# Patient Record
Sex: Male | Born: 1955
Health system: Southern US, Community
[De-identification: ages and names within clinical notes are randomized; demographics above are authoritative.]

## PROBLEM LIST (undated history)

## (undated) DIAGNOSIS — E785 Hyperlipidemia, unspecified: Secondary | ICD-10-CM

## (undated) DIAGNOSIS — R0600 Dyspnea, unspecified: Secondary | ICD-10-CM

## (undated) DIAGNOSIS — Z9889 Other specified postprocedural states: Secondary | ICD-10-CM

## (undated) DIAGNOSIS — F329 Major depressive disorder, single episode, unspecified: Secondary | ICD-10-CM

## (undated) DIAGNOSIS — M1990 Unspecified osteoarthritis, unspecified site: Secondary | ICD-10-CM

## (undated) DIAGNOSIS — K7460 Unspecified cirrhosis of liver: Secondary | ICD-10-CM

## (undated) DIAGNOSIS — I499 Cardiac arrhythmia, unspecified: Secondary | ICD-10-CM

## (undated) DIAGNOSIS — F32A Depression, unspecified: Secondary | ICD-10-CM

## (undated) DIAGNOSIS — R112 Nausea with vomiting, unspecified: Secondary | ICD-10-CM

## (undated) DIAGNOSIS — S060XAA Concussion with loss of consciousness status unknown, initial encounter: Secondary | ICD-10-CM

## (undated) DIAGNOSIS — D696 Thrombocytopenia, unspecified: Secondary | ICD-10-CM

## (undated) DIAGNOSIS — T148XXA Other injury of unspecified body region, initial encounter: Secondary | ICD-10-CM

## (undated) DIAGNOSIS — R079 Chest pain, unspecified: Secondary | ICD-10-CM

## (undated) DIAGNOSIS — F419 Anxiety disorder, unspecified: Secondary | ICD-10-CM

## (undated) DIAGNOSIS — J189 Pneumonia, unspecified organism: Secondary | ICD-10-CM

## (undated) DIAGNOSIS — I10 Essential (primary) hypertension: Secondary | ICD-10-CM

## (undated) DIAGNOSIS — K219 Gastro-esophageal reflux disease without esophagitis: Secondary | ICD-10-CM

## (undated) DIAGNOSIS — J449 Chronic obstructive pulmonary disease, unspecified: Secondary | ICD-10-CM

## (undated) DIAGNOSIS — R161 Splenomegaly, not elsewhere classified: Secondary | ICD-10-CM

## (undated) HISTORY — DX: Other injury of unspecified body region, initial encounter: T14.8XXA

## (undated) HISTORY — DX: Chronic obstructive pulmonary disease, unspecified: J44.9

## (undated) HISTORY — DX: Chest pain, unspecified: R07.9

## (undated) HISTORY — DX: Hyperlipidemia, unspecified: E78.5

## (undated) HISTORY — DX: Major depressive disorder, single episode, unspecified: F32.9

## (undated) HISTORY — DX: Unspecified cirrhosis of liver: K74.60

## (undated) HISTORY — DX: Splenomegaly, not elsewhere classified: R16.1

## (undated) HISTORY — DX: Anxiety disorder, unspecified: F41.9

## (undated) HISTORY — DX: Essential (primary) hypertension: I10

## (undated) HISTORY — DX: Thrombocytopenia, unspecified: D69.6

## (undated) HISTORY — DX: Depression, unspecified: F32.A

---

## 1987-07-10 HISTORY — PX: VASECTOMY: SHX75

## 2001-04-19 ENCOUNTER — Emergency Department (HOSPITAL_COMMUNITY): Admission: EM | Admit: 2001-04-19 | Discharge: 2001-04-19 | Payer: Self-pay | Admitting: Internal Medicine

## 2001-10-22 ENCOUNTER — Observation Stay (HOSPITAL_COMMUNITY): Admission: EM | Admit: 2001-10-22 | Discharge: 2001-10-23 | Payer: Self-pay | Admitting: Cardiovascular Disease

## 2001-10-22 ENCOUNTER — Encounter: Payer: Self-pay | Admitting: Internal Medicine

## 2001-10-22 HISTORY — PX: CARDIAC CATHETERIZATION: SHX172

## 2001-10-24 ENCOUNTER — Encounter: Payer: Self-pay | Admitting: *Deleted

## 2001-10-24 ENCOUNTER — Ambulatory Visit (HOSPITAL_COMMUNITY): Admission: RE | Admit: 2001-10-24 | Discharge: 2001-10-24 | Payer: Self-pay | Admitting: *Deleted

## 2001-11-05 ENCOUNTER — Ambulatory Visit (HOSPITAL_COMMUNITY): Admission: RE | Admit: 2001-11-05 | Discharge: 2001-11-05 | Payer: Self-pay | Admitting: Family Medicine

## 2001-11-05 ENCOUNTER — Encounter: Payer: Self-pay | Admitting: Family Medicine

## 2001-11-10 ENCOUNTER — Ambulatory Visit (HOSPITAL_COMMUNITY): Admission: RE | Admit: 2001-11-10 | Discharge: 2001-11-10 | Payer: Self-pay | Admitting: Family Medicine

## 2001-11-10 ENCOUNTER — Encounter: Payer: Self-pay | Admitting: Family Medicine

## 2002-12-22 ENCOUNTER — Emergency Department (HOSPITAL_COMMUNITY): Admission: EM | Admit: 2002-12-22 | Discharge: 2002-12-22 | Payer: Self-pay | Admitting: Emergency Medicine

## 2002-12-22 ENCOUNTER — Encounter: Payer: Self-pay | Admitting: Emergency Medicine

## 2003-03-22 ENCOUNTER — Ambulatory Visit (HOSPITAL_COMMUNITY): Admission: RE | Admit: 2003-03-22 | Discharge: 2003-03-22 | Payer: Self-pay | Admitting: Family Medicine

## 2003-03-22 ENCOUNTER — Encounter: Payer: Self-pay | Admitting: Family Medicine

## 2003-03-23 ENCOUNTER — Inpatient Hospital Stay (HOSPITAL_COMMUNITY): Admission: AD | Admit: 2003-03-23 | Discharge: 2003-03-28 | Payer: Self-pay | Admitting: Family Medicine

## 2003-03-23 ENCOUNTER — Encounter: Payer: Self-pay | Admitting: Family Medicine

## 2003-03-24 ENCOUNTER — Encounter: Payer: Self-pay | Admitting: Family Medicine

## 2003-04-05 ENCOUNTER — Encounter: Payer: Self-pay | Admitting: Family Medicine

## 2003-04-05 ENCOUNTER — Ambulatory Visit (HOSPITAL_COMMUNITY): Admission: RE | Admit: 2003-04-05 | Discharge: 2003-04-05 | Payer: Self-pay | Admitting: Family Medicine

## 2003-05-07 ENCOUNTER — Ambulatory Visit (HOSPITAL_COMMUNITY): Admission: RE | Admit: 2003-05-07 | Discharge: 2003-05-07 | Payer: Self-pay | Admitting: Family Medicine

## 2003-05-08 ENCOUNTER — Emergency Department (HOSPITAL_COMMUNITY): Admission: EM | Admit: 2003-05-08 | Discharge: 2003-05-08 | Payer: Self-pay | Admitting: Internal Medicine

## 2005-03-17 ENCOUNTER — Inpatient Hospital Stay (HOSPITAL_COMMUNITY): Admission: EM | Admit: 2005-03-17 | Discharge: 2005-03-21 | Payer: Self-pay | Admitting: *Deleted

## 2005-03-27 ENCOUNTER — Inpatient Hospital Stay (HOSPITAL_COMMUNITY): Admission: AD | Admit: 2005-03-27 | Discharge: 2005-04-11 | Payer: Self-pay | Admitting: Pulmonary Disease

## 2005-03-27 ENCOUNTER — Encounter: Payer: Self-pay | Admitting: Emergency Medicine

## 2005-03-28 ENCOUNTER — Encounter (INDEPENDENT_AMBULATORY_CARE_PROVIDER_SITE_OTHER): Payer: Self-pay | Admitting: Cardiology

## 2005-03-28 ENCOUNTER — Ambulatory Visit: Payer: Self-pay | Admitting: Pulmonary Disease

## 2005-04-26 ENCOUNTER — Ambulatory Visit (HOSPITAL_COMMUNITY): Admission: RE | Admit: 2005-04-26 | Discharge: 2005-04-26 | Payer: Self-pay | Admitting: Family Medicine

## 2005-05-04 ENCOUNTER — Ambulatory Visit: Payer: Self-pay | Admitting: Family Medicine

## 2005-05-04 ENCOUNTER — Ambulatory Visit (HOSPITAL_COMMUNITY): Admission: RE | Admit: 2005-05-04 | Discharge: 2005-05-04 | Payer: Self-pay | Admitting: Family Medicine

## 2005-05-11 ENCOUNTER — Ambulatory Visit: Payer: Self-pay | Admitting: Family Medicine

## 2005-05-25 ENCOUNTER — Ambulatory Visit: Payer: Self-pay | Admitting: Family Medicine

## 2005-06-15 ENCOUNTER — Encounter (HOSPITAL_COMMUNITY): Admission: RE | Admit: 2005-06-15 | Discharge: 2005-06-22 | Payer: Self-pay | Admitting: Family Medicine

## 2005-06-15 ENCOUNTER — Ambulatory Visit: Payer: Self-pay | Admitting: Family Medicine

## 2005-06-19 ENCOUNTER — Ambulatory Visit (HOSPITAL_COMMUNITY): Admission: RE | Admit: 2005-06-19 | Discharge: 2005-06-19 | Payer: Self-pay | Admitting: Family Medicine

## 2005-07-04 ENCOUNTER — Ambulatory Visit: Payer: Self-pay | Admitting: Family Medicine

## 2005-07-04 ENCOUNTER — Emergency Department (HOSPITAL_COMMUNITY): Admission: EM | Admit: 2005-07-04 | Discharge: 2005-07-04 | Payer: Self-pay | Admitting: Emergency Medicine

## 2005-07-06 ENCOUNTER — Ambulatory Visit: Payer: Self-pay | Admitting: Family Medicine

## 2005-07-20 ENCOUNTER — Ambulatory Visit: Payer: Self-pay | Admitting: Family Medicine

## 2005-07-27 ENCOUNTER — Ambulatory Visit: Payer: Self-pay | Admitting: Family Medicine

## 2005-07-27 ENCOUNTER — Emergency Department (HOSPITAL_COMMUNITY): Admission: EM | Admit: 2005-07-27 | Discharge: 2005-07-27 | Payer: Self-pay | Admitting: Emergency Medicine

## 2005-08-13 ENCOUNTER — Emergency Department (HOSPITAL_COMMUNITY): Admission: EM | Admit: 2005-08-13 | Discharge: 2005-08-13 | Payer: Self-pay | Admitting: Emergency Medicine

## 2005-08-13 ENCOUNTER — Ambulatory Visit: Payer: Self-pay | Admitting: Family Medicine

## 2005-08-14 ENCOUNTER — Ambulatory Visit: Payer: Self-pay | Admitting: Family Medicine

## 2005-08-27 ENCOUNTER — Ambulatory Visit: Payer: Self-pay | Admitting: Family Medicine

## 2005-08-30 ENCOUNTER — Inpatient Hospital Stay (HOSPITAL_COMMUNITY): Admission: EM | Admit: 2005-08-30 | Discharge: 2005-09-04 | Payer: Self-pay | Admitting: Emergency Medicine

## 2005-09-14 ENCOUNTER — Ambulatory Visit (HOSPITAL_COMMUNITY): Admission: RE | Admit: 2005-09-14 | Discharge: 2005-09-14 | Payer: Self-pay | Admitting: Pulmonary Disease

## 2005-09-21 ENCOUNTER — Ambulatory Visit: Payer: Self-pay | Admitting: Family Medicine

## 2006-01-28 ENCOUNTER — Observation Stay (HOSPITAL_COMMUNITY): Admission: EM | Admit: 2006-01-28 | Discharge: 2006-01-29 | Payer: Self-pay | Admitting: Emergency Medicine

## 2006-12-16 ENCOUNTER — Ambulatory Visit (HOSPITAL_COMMUNITY): Admission: RE | Admit: 2006-12-16 | Discharge: 2006-12-16 | Payer: Self-pay | Admitting: Pulmonary Disease

## 2009-03-09 HISTORY — PX: HERNIA REPAIR: SHX51

## 2009-04-03 ENCOUNTER — Inpatient Hospital Stay (HOSPITAL_COMMUNITY): Admission: EM | Admit: 2009-04-03 | Discharge: 2009-04-06 | Payer: Self-pay | Admitting: Emergency Medicine

## 2009-07-09 HISTORY — PX: TRANSTHORACIC ECHOCARDIOGRAM: SHX275

## 2010-03-01 ENCOUNTER — Ambulatory Visit (HOSPITAL_COMMUNITY): Admission: RE | Admit: 2010-03-01 | Discharge: 2010-03-01 | Payer: Self-pay | Admitting: General Surgery

## 2010-07-30 ENCOUNTER — Encounter: Payer: Self-pay | Admitting: Pulmonary Disease

## 2010-09-21 LAB — CBC
RBC: 5.22 MIL/uL (ref 4.22–5.81)
RDW: 13 % (ref 11.5–15.5)

## 2010-09-21 LAB — BASIC METABOLIC PANEL
Chloride: 104 mEq/L (ref 96–112)
Creatinine, Ser: 0.78 mg/dL (ref 0.4–1.5)
GFR calc Af Amer: >60 mL/min (ref >60)
GFR calc non Af Amer: >60 mL/min (ref >60)
Sodium: 142 mEq/L (ref 135–145)

## 2010-09-21 LAB — SURGICAL PCR SCREEN: MRSA, PCR: NEGATIVE

## 2010-09-21 LAB — GLUCOSE, CAPILLARY: Glucose-Capillary: 127 mg/dL — ABNORMAL HIGH (ref 70–99)

## 2010-10-13 LAB — GLUCOSE, CAPILLARY
Glucose-Capillary: 179 mg/dL — ABNORMAL HIGH (ref 70–99)
Glucose-Capillary: 191 mg/dL — ABNORMAL HIGH (ref 70–99)
Glucose-Capillary: 191 mg/dL — ABNORMAL HIGH (ref 70–99)
Glucose-Capillary: 231 mg/dL — ABNORMAL HIGH (ref 70–99)

## 2010-10-13 LAB — DIFFERENTIAL
Basophils Absolute: 0 10*3/uL (ref 0.0–0.1)
Basophils Relative: 0 % (ref 0–1)
Lymphocytes Relative: 17 % (ref 12–46)
Lymphs Abs: 1.3 10*3/uL (ref 0.7–4.0)
Monocytes Relative: 5 % (ref 3–12)
Neutrophils Relative %: 57 % (ref 43–77)

## 2010-10-13 LAB — CBC
HCT: 47.9 % (ref 39.0–52.0)
Hemoglobin: 16.9 g/dL (ref 13.0–17.0)
MCV: 88.3 fL (ref 78.0–100.0)
RDW: 12.4 % (ref 11.5–15.5)

## 2010-10-13 LAB — BASIC METABOLIC PANEL
BUN: 11 mg/dL (ref 6–23)
CO2: 28 mEq/L (ref 19–32)
Chloride: 104 mEq/L (ref 96–112)
Creatinine, Ser: 0.96 mg/dL (ref 0.4–1.5)
GFR calc Af Amer: >60 mL/min (ref >60)
Glucose, Bld: 127 mg/dL — ABNORMAL HIGH (ref 70–99)
Potassium: 4 mEq/L (ref 3.5–5.1)
Sodium: 139 mEq/L (ref 135–145)

## 2010-10-13 LAB — POCT CARDIAC MARKERS: Myoglobin, poc: 140 ng/mL (ref 12–200)

## 2010-11-24 NOTE — H&P (Signed)
   NAME:  [name], [name]                          ACCOUNT NO.:  [id number]   MEDICAL RECORD NO.:  [id number]                   PATIENT TYPE:  OUT   LOCATION:  RAD                                  FACILITY:  [facility]   PHYSICIAN:  Estill Bamberg. Karie Kirks, M.D.           DATE OF BIRTH:  1956-04-17   DATE OF ADMISSION:  03/22/2003  DATE OF DISCHARGE:                                HISTORY & PHYSICAL   HISTORY OF PRESENT ILLNESS:  This 55 year old presented to the office  yesterday with a history of cough and fever.  He had decreased breath sounds  in his right posterior base so he was sent for x-ray and blood count.  His  white cell count was 6200 with a normal differential except for 14%  eosinophils.  Hemoglobin was 14.9.  Chest x-ray showed bronchitic changes  and some focal opacity right lower lobe which was felt to be possible early  infiltrate.  He was stable as far as his respiratory system so he was sent  home on Levaquin 500 mg daily - first dose in the office.   He returned this morning, dyspneic and wheezing.  His wife said he had been  doing this off and on for some time.  Along with his symptoms he has had  cough productive of some sputum.   He has had cardiac evaluation in the past due to left ventricular  dysfunction.  He has been on Niaspan for hyperlipidemia but was unable to  tolerate it.  He has been a cigarette smoker but has cut way back in the  last year, particularly in the last week or so.   OBJECTIVE:  GENERAL:  Physical exam in the office today showed a wheezing,  dyspneic, middle-aged man whose weight was 158, blood pressure 124/58, pulse  of 90, respiratory rate of 36.  His color was good.  LUNGS:  Showed inspiratory and expiratory wheezes throughout but he is  moving air well and there are no intercostal retractions.  There was some  use of accessory muscles of respiration.  HEART:  Appeared to have a regular rhythm, rate of about 90.  ABDOMEN:  Soft without  tenderness, slightly protuberant.  EXTREMITIES:  There was no edema of the ankles.   ASSESSMENT:  1. He is admitted to the hospital with pneumonia and reactive airway     disease.  2. Mild left ventricular dysfunction.  3. Gastroesophageal reflux disease.  4. Hyperlipidemia by history.  5. Tobacco use disorder.   PLAN:  Repeat his chest x-ray, blood work.  Start him on Rocephin 1 gram IV  q.24h., with Levaquin 500 mg p.o. daily, and albuterol nebulizer treatments  and oxygen.      SDK/MEDQ  D:  03/23/2003  T:  03/23/2003  Job:  [id number]

## 2010-11-24 NOTE — Consult Note (Signed)
NAME:  [name], SAUBER                          ACCOUNT NO.:  [id number]   MEDICAL RECORD NO.:  [id number]                   PATIENT TYPE:  INP   LOCATION:  A301                                 FACILITY:  [facility]   PHYSICIAN:  Edward L. [name], M.D.             DATE OF BIRTH:  [birth year]   DATE OF CONSULTATION:  03/23/2003  DATE OF DISCHARGE:                                   CONSULTATION   PROBLEM:  Mr. Stief is a 55 year old who was admitted on the 14th with  pneumonia.  He has had cough, fever for about two weeks.  When he saw Dr.  Karie Kirks, he was noted to have decreased breath sounds in the right base.  He was sent for chest x-ray and blood count, which showed a white count of  6200, normal differential, but 14% eosinophils.  He had bronchitic changes  with some question of early infiltrate versus a bronchiectasis.  When he  first saw Dr. Karie Kirks, he appeared to be doing fairly well.  It was felt  that he could be treated as an outpatient, so he was given Levaquin p.o. and  allowed to go home.  However, he continued to have more problems, went back  to Dr. Vickey Sages office, and at that point was admitted.  Today, he had a  severe coughing episode and coughed up large amounts of sputum and actually  passed out.  He has been noted to be tachycardic with a heart rate in the  140s.  His only other medical problem has been that he has had cardiac  evaluation with some left ventricular dysfunction; that has been apparently  in the last year or so.  He has been on Niaspan for hyperlipidemia, was not  able to take that.   PAST MEDICAL HISTORY:  Positive for pneumonia, he says double pneumonia when  he was a child, and then pneumonia again later in life.   SOCIAL HISTORY:  He has smoked about two packs of cigarettes daily, has cut  back, and cut back tremendously recently.   FAMILY HISTORY:  Negative for any sort of lung carcinomas.  He does not know  of any history of bronchiectasis.   He does have a history of chronic sinus  problems.   PHYSICAL EXAMINATION:  VITAL SIGNS:  His blood pressure is 110/70, pulse  140.  HEENT:  His mucous membranes are slightly dry.  Nose and throat are clear.  He does not have any bruits.  NECK:  Supple without masses.  He has jugular venous distention at the angle  of the jaw.  CHEST:  Relatively clear but with rales in the right base and some wheezes  scattered throughout.  HEART:  Regular.  He does not have a gallop.  CNS EXAMINATION:  Grossly intact.   ASSESSMENT:  1. He has pneumonia, possibly bronchiectasis.  2. He does have a history of left ventricular  dysfunction.   PLAN:  1. Continue the Levaquin as before.  2.     I agree with the use of nebulizer treatments, but I would switch him to     albuterol because of his heart rate.  3. I would ask him to be transferred to a monitored bed and have a CT scan     of the chest today.  4. He may need another echocardiogram, this to see if he has further left     ventricular dysfunction.                                               Edward L. [name], M.D.    ELH/MEDQ  D:  03/24/2003  T:  03/24/2003  Job:  [id number]

## 2010-11-24 NOTE — Group Therapy Note (Signed)
   NAME:  [name], [name]                          ACCOUNT NO.:  [id number]   MEDICAL RECORD NO.:  [id number]                   PATIENT TYPE:  INP   LOCATION:  A301                                 FACILITY:  [facility]   PHYSICIAN:  Estill Bamberg. Karie Kirks, M.D.           DATE OF BIRTH:  1956/03/07   DATE OF PROCEDURE:  03/23/2003  DATE OF DISCHARGE:                                   PROGRESS NOTE   SUBJECTIVE:  The patient is breathing better, although he had one very bad  coughing spell today.  More history from the patient and the family shows  that he started coughing about a week ago.  He was never coughing up sputum.  He never really had fever or chills, but today he got very hot and broke  into a sweat at least one time.   OBJECTIVE:  LUNGS:  Tonight, the lungs show occasional wheezes, but moving  air pretty well.  No intercostal retractions.  No use of accessory muscles  or respirations.  HEART:  Regular rate and rhythm with rate of about 70.  Color was good on O2  2 L.   Review of his blood work showed his white cell count was down at 6400.  He  had 65% neutrophils, 19 lymphs, 11 eos.  Glucose was 105.  AST 39, ALT 66,  both mildly elevated.   ASSESSMENT:  Pneumonia versus severe allergy.   PLAN:  Chest x-ray was done this morning and the report is still pending.  If he does have more of a developing infiltrate in the right lower lobe, he  will need antibiotics and possible low-dose steroids.  If there is no  infiltrate, may consider high-dose steroids.  Discussed with patient and his  wife at length this evening.                                               Estill Bamberg. Karie Kirks, M.D.    SDK/MEDQ  D:  03/23/2003  T:  03/23/2003  Job:  [id number]

## 2010-11-24 NOTE — H&P (Signed)
[name], [name] NO.:  [id number]   MEDICAL RECORD NO.:  [id number]          PATIENT TYPE:  INP   LOCATION:  2109                         FACILITY:  [facility]   PHYSICIAN:  Christopher Rima, MD     DATE OF BIRTH:  Nov 13, 1955   DATE OF ADMISSION:  03/27/2005  DATE OF DISCHARGE:                                HISTORY & PHYSICAL   CHIEF COMPLAINT:  Vent-dependent respiratory failure and sepsis.   CARDIOLOGIST:  Dr. Mathis [name] in [place]   PRIMARY CARE PHYSICIAN:  Dr. Karie [name]   HISTORY OF PRESENT ILLNESS:  This is a 55 year old white male who was  recently discharged from [facility] on September 13 after being  treated for a COPD exacerbation, sent home with home O2 which he had not had  previously and presented to [facility] ED on September 19 with complaint of  shortness of breath, cough, fever, nausea, vomiting, diarrhea, generalized  malaise, and body aches subsequently requiring intubation secondary to  tachypnea and hypoxia.  He has been transferred to [facility] to  [facility] service.   PAST MEDICAL HISTORY:  1.  COPD.  2.  Emphysema.  3.  Hypertension.  4.  GERD.  5.  Hypercholesterolemia.  6.  Tobacco use.  7.  Mild left ventricular dysfunction.  8.  Tachycardia.   MEDICATIONS:  Oxygen at 2 L/minute, prednisone, Combivent which the patient  has not been taking, albuterol, ipratropium, Lasix, diltiazem, potassium,  TriCor, tramadol, Nicoderm patch, doxepin, multivitamin, 81 mg aspirin.   ALLERGIES:  CODEINE.   SOCIAL HISTORY:  No alcohol or other drugs.  He does have a 25-year history  of smoking one pack per day.  He recently quit approximately two weeks ago  after last hospital admission.  He works for the [employer]   FAMILY HISTORY:  Father died of lung disease.  He had emphysema, asthma,  hypertension.  Mother alive with hypertension, diabetes.  She has a history  of lung cancer with resection.  He has three sisters  and one brother with  diabetes, one brother with hypertension.   REVIEW OF SYSTEMS:  As per HPI.   PHYSICAL EXAMINATION:  VITAL SIGNS:  Temperature 100.9, blood pressure  62/18, pulse 124, respirations 16, O2 saturations 96-97 on 50% FiO2.  GENERAL:  He is intubated and sedated.  HEENT:  Pupils are equal, round, and reactive to light and accommodation.  Oral mucosa pink, dry.  No cervical lymphadenopathy.  LUNGS:  Coarse breath sounds bilaterally with rhonchi and no wheeze.  CARDIOVASCULAR:  S1, S2.  Tachycardic, no murmurs.  ABDOMEN:  Soft.  Positive bowel sounds.  No masses or organomegaly.  EXTREMITIES:  Chronic venous stasis changes.  No edema.  SKIN:  Red papular rash on the intertriginous regions.   LABORATORIES:  Cardiac enzymes:  CK-MB 2.8, TI less than 0.05, myoglobin  412.  ABG showed a pH of 7.385, pCO2 43.4, pO2 86.3, bicarbonate 25.4 on 4 L  of oxygen.  BNP less than 30.  D-dimer 3.78.  Sodium 133, potassium 3.9,  chloride 97, CO2 25,  BUN 27, creatinine 2, glucose 160, calcium 9.1.  White  count 66.5, hemoglobin 16, hematocrit 47.3, platelets 231.  UA and urine  culture as well as blood cultures pending at time of dictation.  EKG showing  sinus tachycardia.  Chest x-ray shows a right middle lobe pneumonia, right  upper lobe calcified granuloma, and diffuse peribronchial thickening.   ASSESSMENT/PLAN:  A 55 year old white male with vent-dependent respiratory  failure, sepsis, and pneumonia.  1.  Ventilator-dependent respiratory failure, pneumonia.  Pneumonia likely      recently hospital-acquired pneumonia which was community-acquired      pneumonia.  Will cover with broad-spectrum antibiotics and continue      ventilator support.  2.  Sepsis picture with decreased __________ sepsis protocol initiated to      include pressors, Xigris, cultures, and intravenous fluids.  A central      line has been placed.  3.  Acute renal insufficiency question secondary to acute tubular  necrosis      from sepsis versus dehydration.  Will resuscitate with intravenous      fluids and follow.  4.  Leukocytosis secondary to pneumonia/sepsis.  Will follow for trend to      normal.  5.  Prophylaxis.  Protonix and Lovenox.  6.  Hypoglycemia.  Intensive care unit hypoglycemia protocol enacted.      Christopher Rima, MD     TH/MEDQ  D:  03/27/2005  T:  03/28/2005  Job:  [id number]   cc:   Christopher Peer, MD  Fax: [place]. [name], M.D.  [address] [place], [place] [address]  Fax: [phone]

## 2010-11-24 NOTE — Group Therapy Note (Signed)
[name], [name]                ACCOUNT NO.:  [id number]   MEDICAL RECORD NO.:  [id number]          PATIENT TYPE:  INP   LOCATION:  A204                          FACILITY:  [facility]   PHYSICIAN:  Edward L. [name], M.D.DATE OF BIRTH:  30-Jan-1956   DATE OF PROCEDURE:  DATE OF DISCHARGE:                                   PROGRESS NOTE   PROBLEM:  1.  Acute exacerbation of asthma.  2.  Marked eosinophilia.  3.  Hyperlipidemia.    Mr. Sulton says he feels great and wants to go home.   EXAM:  GENERAL:  Shows that he is awake and alert.  He does not appear to be  dyspneic at all.  VITAL SIGNS:  His temperature is 96.6, pulse 91, respirations 18, blood  pressure 119/62, O2 sats 97% on 2 liters.  CHEST:  Much clearer.  HERAT:  Regular.  ABDOMEN:  Soft.   ASSESSMENT:  He looks much better.   PLAN:  To discharge home today.  Please see discharge summary for details.      Edward L. [name], M.D.  Electronically Signed     ELH/MEDQ  D:  01/29/2006  T:  01/29/2006  Job:  [id number]

## 2010-11-24 NOTE — Group Therapy Note (Signed)
   NAME:  [name], [name]                          ACCOUNT NO.:  [id number]   MEDICAL RECORD NO.:  [id number]                   PATIENT TYPE:  INP   LOCATION:  A215                                 FACILITY:  [facility]   PHYSICIAN:  Estill Bamberg. Karie Kirks, M.D.           DATE OF BIRTH:  May 05, 1956   DATE OF PROCEDURE:  03/26/2003  DATE OF DISCHARGE:                                   PROGRESS NOTE   SUBJECTIVE:  The patient does feel better than he did yesterday.  He is  breathing better.  He was able to get up and take a shower yesterday.   OBJECTIVE:  He is sitting in bed on oxygen of 2 liters.  Color is good.  He  is in no acute distress, well-developed, well-nourished, oriented and alert.  Wife accompanies him in the room.  Temperature 97.5, pulse 93, respiratory  rate 20, blood pressure 160/61.  The lungs are clear throughout, moving air  well.  The heart has a regular rhythm, rate of about 70, the heart sounds  somewhat distant.  During my time in the room he did have one coughing  spell.   Lung CT did show thickening of the bronchi but no pneumonia.   ASSESSMENT:  Asthmatic bronchitis.   PLAN:  Continue the Levaquin 500 mg daily, Xopenex by nebulizer q.8h.,  Robitussin DM two teaspoons but decrease from q.4h. to q.i.d., diltiazem 60  mg q.6h. (started by Dr. [name] when he stopped the Toprol-XL in effort to  decrease bronchospasm), amitriptyline 50 mg q.h.s.  Would discontinue his  Rocephin and discontinue the methylprednisolone, switching that instead to  prednisone 20 mg b.i.d.  Discussed possible discharge in the next several  days.                                               Estill Bamberg. Karie Kirks, M.D.    SDK/MEDQ  D:  03/26/2003  T:  03/26/2003  Job:  [id number]

## 2010-11-24 NOTE — Group Therapy Note (Signed)
[name], [name]                ACCOUNT NO.:  [id number]   MEDICAL RECORD NO.:  [id number]          PATIENT TYPE:  INP   LOCATION:  A226                          FACILITY:  [facility]   PHYSICIAN:  [name], M.D.DATE OF BIRTH:  10-15-1955   DATE OF PROCEDURE:  03/19/2005  DATE OF DISCHARGE:                                   PROGRESS NOTE   SUBJECTIVE:  The patient is feeling somewhat better compared to yesterday.   OBJECTIVE:  VITAL SIGNS:  Temperature 97.6, pulse 99, respirations 20, blood  pressure 120/64.  LUNGS:  Decreased breath sounds throughout but he is moving air well.  No  intercostal retraction.  No use of accessory muscle respiration.  I cannot  hear any expiratory wheezing today, which is an improvement.  HEART:  Regular rhythm rate of about 110, sitting.  Color is good.  EXTREMITIES:  There is a little puffiness of his cheeks.  There is no edema  of the ankles or feet.   LABORATORY DATA:  O2 saturation is 96% on 4 liters.  His T4 and TSH were  normal.   ASSESSMENT:  1.  Chronic obstructive pulmonary disease exacerbation probably secondary to      pollen allergy.  2.  Tobacco use disorder, now off cigarettes for a week.  3.  Multiple calcified granulomatous lung, question etiology.   PLAN:  He is due for his CT of the lung today.  I am cutting his Solu-Medrol  off and switching him to prednisone 20 mg b.i.d.  Continue to ambulate in  the room.      [name], M.D.  Electronically Signed     SDK/MEDQ  D:  03/19/2005  T:  03/19/2005  Job:  [id number]

## 2010-11-24 NOTE — Discharge Summary (Signed)
[name]. [name]  Patient:    [name], [name] Visit Number: [id number] MRN: [id number]          Service Type: OUT Location: RAD Attending Physician:  [name] Dictated by:   [name], F.N.P.C. Admit Date:  10/24/2001   CC:         [name], M.D.  [name] c/o [name]   Discharge Summary  DISCHARGE DIAGNOSES: 1. Chest pain, nonspecific.    a. Negative myocardial infarction.    b. Patent coronary arteries.    c. Mild left ventricular dysfunction, ejection fraction 40 to 50%. 2. An 11 mm right upper lobe lung nodule. 3. Tobacco abuse. 4. Thrombocytopenia. 5. Leukopenia. 6. Family history of coronary disease.  CONDITION ON DISCHARGE:  Improved.  PROCEDURES:  On 10/22/01, combined left heart cath by [name].  DISCHARGE MEDICATIONS: 1. Wellbutrin as before. 2. Toprol XL 25 mg one daily for heart and blood pressure. 3. Protonix 40 mg one daily for stomach.  DISCHARGE INSTRUCTIONS: 1. Stop smoking. 2. Have laboratory work done next Tuesday, 10/28/01, at [name] office or    [name] and CBC.  ACTIVITY:  No strenuous activity, no sexual activity, no lifting over 10 pounds for 3 days, then may resume regular activities.  May return to work on Monday if you feel up to that, if not, you may stay out.  DIET:  Regular.  WOUND CARE:  Wash cath site with soap and water, call if any bleeding, swelling, or drainage.  FOLLOWUP: 1. You are scheduled for a CAT scan of your chest at [name] on    10/24/01 at 2 p.m. 2. [name] in [name] on Nov 06, 2001, at 10:45 a.m. 3. [name] in one week.  HISTORY OF PRESENT ILLNESS:  The patient is a 55 year old white married male with past medical history of migraine headaches, experienced chest pain on 10/21/01, around 9:30 p.m.  He was short of breath, but no nausea, vomiting, or diaphoresis.  It did radiate  to his left arm.  The chest pain was located in his right anterior chest.  He had a stuttering pattern of chest pain throughout the night.  By 10/22/01, the pain had increased.  It was rated a 7 on a 1 to 10 scale.  He went to the emergency department at [name], relieved with nitroglycerin sublingually x2.  Still continues with mild chest discomfort.  He had a previous similar episode of chest pain three weeks ago.  He had risk factors of positive tobacco, elevated cholesterol, and a family history of premature coronary disease.  He was then transported to [name] for cardiac catheterization.  PAST MEDICAL HISTORY: 1. Migraine headaches. 2. Remote pneumonia. 3. Tobacco abuse.  ALLERGIES:  CODEINE.  OUTPATIENT MEDICATIONS:  Wellbutrin.  SOCIAL HISTORY:  Positive for tobacco 1 pack per day for 20 years.  No alcohol.  No illicit drugs.  He is married with one daughter.  He is a Administrator.  Does heavy lifting, and works for the [employer]  FAMILY HISTORY:  Mother with a myocardial infarction in her 34s, brother with myocardial infarction at 73, hypertension, and diabetes.  REVIEW OF SYSTEMS:  HEENT:  The patient has ENT allergies.  CARDIOVASCULAR:  Negative.  RESPIRATORY:  Negative.  GASTROINTESTINAL:  Negative.  No diabetes, no thyroid disease.  DISCHARGE PHYSICAL EXAMINATION:  VITAL SIGNS:  Blood pressure  114/64, pulse 62, respiratory rate 20, temperature 97.3, [name] air oxygen saturation 97%.  GENERAL:  Alert and oriented male in no acute distress.  HEART:  Regular rate and rhythm, without murmur, gallop, rub, or click.  LUNGS:  Clear to auscultation bilaterally.  EXTREMITIES:  Right groin catheter site without hematoma and good distal pulses.  LABORATORY DATA:  On admission labs showed a CBC with a hemoglobin of 15.6, hematocrit 43.8, white blood cell count 4.4, platelets 101.  Neutrophils 65, lymphs 24, monos 6, eosinophils  4, basophils 1.  Followup after his catheterization, hemoglobin 14.6, hematocrit 41.5, white blood cell count 3.9, and platelets were down to 88.  Prothrombin time on admission was 15.6, INR 1.6, PTT 31, and D-dimer was less than 0.50.  Admission chemistries showed a sodium of 138, potassium 4.1, chloride 108, CO2 26, glucose 118, BUN 10, creatinine 0.9.  Total bilirubin 0.8, alkaline phosphatase 119, SGOT 26, SGPT 38, total protein 6.9, albumin 4.0, calcium 9.0.  These were stable throughout hospitalization.  Cardiac enzymes:  Initial CK 188, second 127, third 95.  MB 2.9, 2.5 and 2.0. Troponin-I was less than 0.01, and peaked at 0.02, all negative for myocardial infarction.  Lipid panel was pending at time of discharge.  RADIOLOGY:  Chest x-ray portable chest, one view, on October 22, 2001, 11 mm nodule in the right upper lobe, several smaller adjacent nodular densities were seen, most likely represent granulomata.  Left lung was clear.  No focal infiltrate or edema.  EKG at [name] showed sinus tachycardia rate of 111, some mild ST depression in inferior leads.  Followup on 10/22/01 at [name], normal EKG.  EKG on October 23, 2001, normal EKG.  He had some early repolarizations in leads 2, 3, and aVF.  On 10/22/01, cardiac catheterization revealed patent coronary arteries.  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  He did have left global hypokinesis that was mild, ejection fraction was 40 to 50%, no mitral regurgitation.  A 2-D echocardiogram has been ordered, the results are still pending, and has been completed.  [name] COURSE:  [name] was seen in the emergency [name] at [name] by [name], and transferred to [name] for cardiac  catheterization secondary to chest pain and normal EKGs.  Prior to transfer on chest x-ray he was found to have an 11 mm right upper lobe  nodule.  He was started on IV heparin and IV nitroglycerin, brought to [name], was still having some chest pain, went to the cardiac catheterization lab, and found to have patent coronary arteries.  Was started on Protonix and some Toprol.  By 10/23/01, he was stable, no further chest pain, and a 2-D echocardiogram is pending as well as his lipid panel.  The patient is aware of his nodule in his right upper lobe.  He is to have a CT scan at [facility] on 10/24/01.  The patient has thrombocytopenia and will have repeat CBC on Tuesday of next week.  It dropped to 88 with the use of heparin.  I have not put him on aspirin secondary to his thrombocytopenia.  We will hold off and see how that is.  He will follow up with [name] and his primary care physician, [name].  He may need pulmonary consult depending on the CT results.  Please note, Dr. Tami Ribas discharged the patient.Dictated by:   Christopher Carpen  [name], F.N.P.C. Attending Physician:  [name] DD:  10/23/01 TD:  10/24/01 Job: [id number]

## 2010-11-24 NOTE — Group Therapy Note (Signed)
   NAME:  [name], [name]                          ACCOUNT NO.:  [id number]   MEDICAL RECORD NO.:  [id number]                   PATIENT TYPE:  INP   LOCATION:  A301                                 FACILITY:  [facility]   PHYSICIAN:  Estill Bamberg. Karie Kirks, M.D.           DATE OF BIRTH:  Sep 24, 1955   DATE OF PROCEDURE:  03/24/2003  DATE OF DISCHARGE:                                   PROGRESS NOTE   SUBJECTIVE:  The patient did pretty well last night, but then had a very bad  coughing spell at about 7:45 today.  According to his wife, he could not  stop coughing, turned blue, and eventually ended up on the floor.  Nursing  got him up.  This happened shortly after an albuterol neb treatment.   Nursing was able to get him up.  Initially, he was confused and then the  confusion cleared.  I saw him shortly after this attack.   OBJECTIVE:  VITAL SIGNS:  Temperature is 97.1, pulse 145, up from about 105  to 110 during the night, respiratory rate was 28, blood pressure was 147/88,  O2 sat was 100% on 2 L.  GENERAL:  Color was good.  He appeared to be oriented and alert on my exam.  He was able to give a good history to Dr. [name], pulmonologist, as he  asked him questions.  LUNGS:  Some scattered rhonchi and some rales at the bases.  HEART:  Regular rhythm, but tachycardic, rate 140.  EXTREMITIES:  There is no edema of the ankles.  HEENT:  Mucous membranes are moist.   ASSESSMENT:  1. Pneumonia versus bronchiectasis versus even allergy.  2. Tachycardia which may be secondary to the albuterol and illness itself.  3. Syncope.  4. Question bronchiectasis from his history of double pneumonia years ago     as a child.   PLAN:  1. Continue the Rocephin 1 g IV q.24h. and Levaquin 500 mg p.o. q.d.  2. He is due for high resolution CT scan today.  3. At present, he will need Solu-Medrol 125 mg IV q.6h.  4. Dr. [name] recommendations were to switch him from albuterol to Xopenex,     and he will also  be transferred from our third floor down to our second     floor on the monitor.   Full discussion with the patient, nursing, Dr. [name], review of his  electronic medical record, review of the paper record, exam, formulation of  plan, dictation took a 1/2 hour.      SDK/MEDQ  D:  03/24/2003  T:  03/24/2003  Job:  [id number]

## 2010-11-24 NOTE — Discharge Summary (Signed)
[name], [name]                ACCOUNT NO.:  [id number]   MEDICAL RECORD NO.:  [id number]          PATIENT TYPE:  INP   LOCATION:  5102                         FACILITY:  [facility]   PHYSICIAN:  Chesley Mires, M.D.      DATE OF BIRTH:  Nov 15, 1955   DATE OF ADMISSION:  03/27/2005  DATE OF DISCHARGE:  04/11/2005                                 DISCHARGE SUMMARY   DISCHARGE DIAGNOSES:  1.  Right lower lobe/right middle lobe severe hospital-acquired pneumonia.  2.  Hypoxic/acute respiratory failure leading to ventilator-dependent      respiratory failure secondary to #1 (resolved).  3.  Chronic obstructive pulmonary disease with asthmatic bronchitis flare.  4.  Methicillin-resistant Staphylococcus aureus bacteremia, resolved.  5.  Methicillin-resistant Staphylococcus aureus urinary tract infection.  6.  Severe sepsis, resolved.  7.  Leukopenia secondary to Seroquel.   PROCEDURES:  1.  Endotracheal intubation from dates September 19 to September 28.  2.  Arterial line placement September 19 to September 28.  3.  Central venous catheter September 19 to September 24.  4.  Right PICC line September 24 to October 4.   LABORATORY DATA:  April 11, 2005:  Hemoglobin 9.6, hematocrit 27.5, white  blood cells 3.7, platelets 309.  April 11, 2005:  Sodium 141, potassium  3.5, chloride 105, CO2 27, BUN 9, creatinine 0.7, glucose 99.   MICROBIOLOGY DATA:  April 02, 2005:  Sputum culture gram-positive cocci  in pairs demonstrating methicillin-resistant Staph aureus.  March 27, 2005:  Blood cultures x2 methicillin-resistant Staph aureus.   Chest x-ray April 05, 2005:  Stable bilateral opacities within the right  middle lung and left base.   BRIEF HISTORY:  This is a 55 year old white male who was recently discharged  from [facility] on September 13 after being treated for a chronic  obstructive pulmonary disease exacerbation.  He was sent home with home  oxygen which he had  not previously been on.  He presented to [facility] on September 19 with complaints of shortness of breath, cough,  fever, nausea, vomiting, diarrhea, and generalized malaise and body aches.  He subsequently required endotracheal intubation for tachypnea and hypoxia.  He was transferred to [facility] for critical care medicine services.   PAST MEDICAL HISTORY:  1.  Chronic obstructive pulmonary disease.  2.  Emphysema.  3.  Hypertension.  4.  GERD.  5.  Hypercholesterolemia.  6.  Tobacco use.  7.  Mild left ventricular dysfunction.  8.  Tachycardia.   HOSPITAL COURSE:  #1 - SEVERE HOSPITAL-ACQUIRED RIGHT LOWER LOBE/RIGHT  MIDDLE LOBE PNEUMONIA WITH RESULTANT HYPOXIC ACUTE RESPIRATORY FAILURE  REQUIRING VENTILATOR DEPENDENCY:  [name] was intubated at [facility], transferred to the [facility] for further evaluation  and treatment.  He was placed on mechanical ventilation, sedating drips with  Versed and fentanyl, and treated with broad-spectrum antibiotics consisting  of initially azithromycin, cefepime, and vancomycin.  Culture data was  obtained with respiratory culture demonstrating methicillin-resistant Staph  aureus.  Zithromax and cefepime was then discontinued.  Vancomycin  was  continued for methicillin-resistant Staph.  His vancomycin was discontinued  on April 10, 2005.  The patient was continued on mechanical ventilation  with steady improvement in his chest x-ray and oxygenation.  He was placed  on a wean protocol on April 05, 2005 and extubated successfully on  April 05, 2005.  He did require approximately 24-48 hours of non-  invasive positive pressure ventilation via mask, but continued to remain  significant improvement and at time of discharge is on room air oxygenation  without dyspnea.   #2 - SEVERE SEPSIS WITH MRSA BACTEREMIA AND URINARY TRACT INFECTION:  As  previously mentioned, [name] was admitted to the hospital  with a known  right lower lobe/right middle lobe pneumonia which was demonstrated to be  MRSA hospital-acquired pneumonia.  Additionally, pan culture data  demonstrated both urinary and blood culture positive with MRSA.  The  antibiotic regimen was noted above.  He did complete a two-week course of IV  vancomycin for his methicillin-resistant pan culture positives.  Additionally, Mr. Augello was treated aggressively with the septic shock  protocol requiring aggressive IV resuscitation and central venous access  during his initial resuscitation phase.  He was also placed on Xigris from  September 19 to September 22 for treatment of his severe sepsis.  This was  considered successful, but was discontinued early secondary to  thrombocytopenia.   #3 - CHRONIC OBSTRUCTIVE PULMONARY DISEASE/ASTHMATIC BRONCHITIS FLARE:  Patient was given nebulizers during treatment for pulmonary toilet.  He will  be discharged home off oxygen without nebulizer therapy.  This will be  followed up at later date.   #4 - LEUKOPENIA SECONDARY TO SEROQUEL:  As part of the ventilator weaning  protocol patient was treated with Seroquel for agitation and to help assist  in weaning him off from the sedating drips.  This was successful, however,  did cause leukopenia.  This was discontinued with his white cell count  continuing to improve.   #5 - IRON DEFICIENCY ANEMIA:  Patient was found to have iron deficiency  anemia during anemia work-up during hospitalization.  He was placed on iron  supplementation and will be sent home on supplemental iron.   DISCHARGE INSTRUCTIONS:   DIET:  No rice or corn per SLP swallowing evaluation.   ACTIVITY:  Walk with assistance and with walker.   MEDICATIONS:  1.  Diltiazem 240 tablet p.o. daily.  2.  Aspirin 81 mg tablet daily.  3.  Protonix 40 mg tablet before breakfast.  4.  Ambien 5 mg tablet before bed as needed. 5.  Ferrous sulfate over-the-counter 325 tablets three times a  day.  6.  TriCor 145 mg tablet daily.   FOLLOW-UP:  Dr. Halford Chessman on November 20 at 11 a.m. and he was also instructed to  follow up with his primary care Karilynn Carranza within the next two weeks.   DISPOSITION:  Patient stable and ready for discharge.  Vital signs stable.  Oxygen saturations 97% on room air.  Blood pressure 127/66, heart rate 97,  respirations 20, temperature 98.7.  Chest with fine bibasilar rales.  Heart  tones normal.  Abdomen soft, nontender, tolerating diet.  Extremities  without edema.  Patient currently ready for discharge.      Marni Griffon, N.P. [facility]      Chesley Mires, M.D.  Electronically Signed    PB/MEDQ  D:  04/11/2005  T:  04/11/2005  Job:  [id number]   cc:   Estill Bamberg. Karie Kirks, M.D.  Fax:  [phone]   Leslye Peer, MD  Fax: [phone]

## 2010-11-24 NOTE — H&P (Signed)
NAMEJAELYN, CLONINGER                ACCOUNT NO.:  [id number]   MEDICAL RECORD NO.:  [id number]          PATIENT TYPE:  INP   LOCATION:  A204                          FACILITY:  [facility]   PHYSICIAN:  Edward L. [name], M.D.DATE OF BIRTH:  1955/11/08   DATE OF ADMISSION:  01/28/2006  DATE OF DISCHARGE:  LH                                HISTORY & PHYSICAL   ADDENDUM:   PHYSICAL EXAMINATION ON ADMISSION:  GENERAL:  On admission, he was a well-  developed male in no acute distress with mild increase in respiratory  effort.  HEENT: His pupils were reactive to light and accommodation.  His mucous  membranes were moist.  NECK:  His neck was supple.  No masses were felt.  No bruits.  CHEST:  His chest showed mild end-expiratory wheezes and minimal use of  accessory muscles.  HEART:  Regular without gallop, murmur or rub.  ABDOMEN:  His abdomen was soft.  Bowel sounds present and active.  He had no  organs that were palpable.   EXTREMITIES:  His extremities showed no edema.  CENTRAL NERVOUS SYSTEM:  Examination was grossly intact.      Edward L. [name], M.D.  Electronically Signed     ELH/MEDQ  D:  01/29/2006  T:  01/29/2006  Job:  [id number]

## 2010-11-24 NOTE — Cardiovascular Report (Signed)
[place]  Patient:    [name], [name] Visit Number: [id number] MRN: [id number]          Service Type: MED Location: [phone] Attending Physician:  Thea Alken Dictated by:   Bryson Dames, M.D. Proc. Date: 10/22/01 Admit Date:  10/22/2001 Discharge Date: 10/23/2001   CC:         Estill Bamberg. Karie Kirks, M.D., [facility]  Cardiac Catheterization Laboratory   Cardiac Catheterization  PROCEDURES PERFORMED: 1. Selective coronary angiography by Judkins technique. 2. Retrograde left heart catheterization. 3. Left ventricular angiography. 4. Abdominal aortography with bifemoral runoff.  COMPLICATIONS: None.  ENTRY SITE: Right femoral.  DYE USED: Omnipaque.  PATIENT PROFILE: The patient is a 55 year old white married male, who presented to the [facility] Emergency Room today with chest pain symptoms and was given sublingual nitroglycerin with improvement. Because of this and the strong family history of coronary disease and a smoking history, the patient was transferred from [facility] to [facility] for admission for anticipation of cardiac catheterization. This procedure was performed on an inpatient electively after transfer from [facility].  RESULTS: The left ventricular pressure was 100/14, central aortic pressure was 100/65 with a mean of 80. No aortic valve gradient by pullback technique.  ANGIOGRAPHIC RESULTS: The left main coronary artery was normal. The left main coronary artery gives rise to one septal perforator branch.  The LAD arises at the origin of a second septal perforator branch from the left main coronary artery and courses down toward the cardiac apex, and is a relatively small vessel with no significant lesions noted. The largest vessel in the left coronary arterial tree is the intermediate ramus branch which gives rise to a diagonal branch, which courses right beside the LAD,  and then the intermediate ramus courses down to the anterolateral wall of the left ventricle. No lesions are seen in the LAD, the one diagonal or the distal runoff of the intermediate ramus.  The right coronary artery is a anatomically medium sized vessel giving rise to a pulmonary conus branch, two acute marginal branches, and terminates as a small posterolateral and a posterior descending branch. No lesions are seen. The anatomically left circumflex coronary artery arises from the right coronary sinus of Valsalva adjacent to the right coronary artery and courses to the atrial ventricular groove. No lesions were seen.  The left ventricle shows global hypocontractility with an EF estimated between 40-50%. There is no mitral regurgitation and no evidence of LV thrombus. The aortic root shows no evidence of ectasia.  FINAL DIAGNOSES: 1. Angiographically patent normal coronary arteries. 2. 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 3. Intermediate ramus dominant coronary system. 4. Mild global hypocontractility of the left ventricle. 5. Abdominal aorta and bifemoral runoff is normal. Dictated by:   Bryson Dames, M.D. Attending Physician:  Thea Alken DD:  10/22/01 TD:  10/22/01 Job: [id number]

## 2010-11-24 NOTE — H&P (Signed)
[name], [name]                ACCOUNT NO.:  [id number]   MEDICAL RECORD NO.:  [id number]          PATIENT TYPE:  INP   LOCATION:  A204                          FACILITY:  [facility]   PHYSICIAN:  Edward L. [name], M.D.DATE OF BIRTH:  1956-06-27   DATE OF ADMISSION:  01/28/2006  DATE OF DISCHARGE:  LH                                HISTORY & PHYSICAL   REASON FOR ADMISSION:  Shortness of breath.   HISTORY:  [name] is a 55 year old who came to the emergency room because  of shortness of breath.  He says that this started mostly on the day prior  to admission.  He said it was fairly gradual in onset, had gotten much  worse, and he has had some chest tightness associated with it.  He has been  using O2 and albuterol, and it has helped to some extent.  He has a previous  history of significant pneumonia.   PAST MEDICAL HISTORY:  Positive for asthma/COPD.  He has had an irregular  heart rate.  He had a lobar pneumonia in February of this year.  In  September 2006, he had a right lower and middle lobe pneumonia, MRSA  bacteremia, and ended up in [facility] at that time for about 15  days.  He has also had an MRSA urinary tract infection in September that  seemed to have started this.  He was septic, hypertensive, gastroesophageal  reflux disease.   SOCIAL HISTORY:  He is married and lives with his wife.  He stopped smoking  in 2006, but has about a 25 pack-year smoking history.  He does not drink  any alcohol or use any drugs.  He works for the [employer]   FAMILY HISTORY:  Positive for lung disease.  His father actually died from  lung disease, emphysema, asthma, hypertension.  His mother is still living  and has diabetes and hypertension and a history of a lung cancer.   REVIEW OF SYSTEMS:  Otherwise is pretty well negative.  He has not had any  swelling.  He says he has had chest tightness but no chest pain.  He has not  been coughing much.   PHYSICAL  EXAMINATION:  VITAL SIGNS:  His temperature is 97.2, pulse 86,  respirations 22, blood pressure 131/66, O2 sat is 97% on 2 L.   White count 5800, hemoglobin 15.6, platelets 133, eosinophils 16%.  Glucose  135.  Electrolytes are normal.  Liver function normal.  Chest x-ray this  time does not show an infiltrate.   ASSESSMENT:  I think he is probably having an acute exacerbation of asthma.   PLAN:  Put him on steroids, continue with his other treatments, nebulizers  etc., and continue diltiazem and TriCor.  Hopefully, he will improve and be  able to be discharged fairly soon.      Edward L. [name], M.D.  Electronically Signed     ELH/MEDQ  D:  01/28/2006  T:  01/28/2006  Job:  [id number]

## 2010-11-24 NOTE — Discharge Summary (Signed)
NAMETAYM, TWIST                ACCOUNT NO.:  [id number]   MEDICAL RECORD NO.:  [id number]          PATIENT TYPE:  INP   LOCATION:  A226                          FACILITY:  [facility]   PHYSICIAN:  Estill Bamberg. Karie Kirks, M.D.DATE OF BIRTH:  04/03/1956   DATE OF ADMISSION:  03/17/2005  DATE OF DISCHARGE:  09/13/2006LH                                 DISCHARGE SUMMARY   This 55 year old was admitted to the hospital with flareup of COPD.  He had  a 6-day hospitalization extending from March 17, 2005, to March 21, 2005.  Vital signs remained stable during that time.   His admission white cell count was 7100 of which 49% neutrophils, 34% monos,  and 11 eosinophils (normal 0 to 5).  His hemoglobin is 15.1, hematocrit  44.2, MCV 85, platelets 150,000.  MET-7 showed a glucose of 121, otherwise  normal.  Blood gases on 4 liters showed pH 7.32, PCO2 51, PO2 86.  Lipids  show cholesterol 221, triglycerides 174, HDL 31, LDL 155.  D-dimer 0.22.  T4  8.4 with TSH 0.511.   His admission portable chest showed calcified granulomas and tiny bilateral  effusions.   CT of the chest showed stable calcified granulomas in the right upper lobe  with 1 measuring 20 x 11 mm.  Emphysematous blebs in both lungs consistent  with COPD but no significant change from prior CT of March 24, 2003.   He was admitted to the hospital, put on:  1.  Solu-Medrol 125 mg IV q.6 h.  2.  Albuterol and Atrovent nebulizer treatments q.4 h. And q.2 h. p.r.n.  3.  O2 nasal cannula at 4 liters.  4.  Nicoderm patch 20 mg daily.  5.  IV normal saline at [facility].  6.  Diltiazem ER 240 mg daily.  7.  TriCor 145 mg nightly.  8.  Amitriptyline 50 mg nightly.  9.  Multivitamins daily.  10. Aspirin 81 mg daily.   He did well on this regimen.  On the third day Solu-Medrol was stopped, and  he was put on prednisone 20 mg p.o. b.i.d. and ambulated in the room.  He  continued to improve such that he was ready for discharge home his  fifth  day.   He is being discharged home on:  1.  Prednisone 10 mg b.i.d. (30 with no refills).  2.  Home oxygen at 2 liters a minute by nasal prongs at least for the next      several weeks.  3.  Combivent inhaler 2 puffs q.4 h. (refilled for the year).  4.  Albuterol ipratropium nebulizer treatments q.4 h. As needed (100 ampules      of each albuterol and ipratropium refill x11).  5.  Furosemide 40 mg daily.  6.  Diltiazem ER 120 mg daily.  7.  Metoprolol 50 mg 1/2 tablet b.i.d.  8.  KCl 20 mEq daily.  9.  TriCor 160 mg daily.  10. Tramadol 50 mg daily.  11. Nicoderm 21 mg patch daily.  12. Doxepin 10 mg 1 to 2 nightly for sleep (60, 2 refills).  Given the drawing effects of Amitriptyline, I told him I would like to  switch to doxepin, but even that might better supplanted by Ambien.  He  should see [place] in followup in the office within the week.  He is to stay out  of work in the meantime.   FINAL DISCHARGE DIAGNOSES:  1.  Chronic obstructive pulmonary disease exacerbation.  2.  Essential hypertension.  3.  Mild left ventricular dysfunction.  4.  Tobacco use.  5.  Gastroesophageal reflux disease.  6.  Hypercholesterolemia.   Time spent today reviewing his paper and electronic medical records, talking  with the patient, examining the patient, writing his full medicine list,  writing prescriptions, formulating a plan, and dictating a note was 35  minutes.      Estill Bamberg. Karie Kirks, M.D.  Electronically Signed     SDK/MEDQ  D:  03/21/2005  T:  03/21/2005  Job:  [id number]

## 2010-11-24 NOTE — Discharge Summary (Signed)
[name], [name]                ACCOUNT NO.:  [id number]   MEDICAL RECORD NO.:  [id number]          PATIENT TYPE:  INP   LOCATION:  A204                          FACILITY:  [facility]   PHYSICIAN:  Edward L. [name], M.D.DATE OF BIRTH:  1956-02-09   DATE OF ADMISSION:  01/28/2006  DATE OF DISCHARGE:  LH                                 DISCHARGE SUMMARY   FINAL DISCHARGE DIAGNOSES:  1.  Acute exacerbation of asthma.  2.  History of MRSA bacteremia respiratory failure from MRSA urinary tract      infection.  3.  Hypertension.  4.  Gastroesophageal reflux disease.  5.  Hyperlipidemia.   [name] is a 55 year old who came to the emergency room because of  shortness of breath.  He said that it started on the day prior to admission  and was fairly gradual in onset, had gotten much worse and he had some chest  tightness with it. He had been using O2 and albuterol at home and it helped  to some extent.  His temperature was 97.2, pulse 86, respirations 22, blood  pressure 131/66, O2 sat was 97%. His HEENT showed his pupils were reactive.  His chest showed some wheezes bilaterally.  His heart was regular, his  abdomen was soft.  Extremities showed no edema.   LABORATORY WORK:  White count was 5800 but was 16% eosinophils.  His glucose  was 135.  Electrolytes and liver function were normal.  Chest x-ray did not  show an infiltrate.   HOSPITAL COURSE:  She was started on inhaled bronchodilators and steroids  and improved fairly rapidly.  He was discharged home on the 24th on Protonix  40 mg daily, iron 325 mg daily, Tricor 145 mg daily, Cardizem CD 240 daily,  aspirin 81 mg daily, Spiriva inhaler daily, Xopenex nebulizer treatments as  needed and multiple vitamin daily.  He is going to be on prednisone 40 mg  for 3 days, 30 for 3 days, 20 for 3 days, 10 for 3 days and then stop.  He  already has an appointment set up in my office and will discuss sending him  to a allergist at that  time.      Edward L. [name], M.D.  Electronically Signed     ELH/MEDQ  D:  01/29/2006  T:  01/29/2006  Job:  [id number]

## 2010-11-24 NOTE — Discharge Summary (Signed)
NAME:  [name], [name]                ACCOUNT NO.:  [id number]   MEDICAL RECORD NO.:  [id number]          PATIENT TYPE:  INP   LOCATION:  A219                          FACILITY:  [facility]   PHYSICIAN:  Vanetta Mulders. Dechurch, M.D.DATE OF BIRTH:  1956-05-24   DATE OF ADMISSION:  08/30/2005  DATE OF DISCHARGE:  02/27/2007LH                                 DISCHARGE SUMMARY   DISCHARGE DIAGNOSES:  1.  Recurrent pneumonia.  2.  Eosinophilia.  3.  Leukopenia.  4.  Anemia of chronic disease, mixed.  5.  Chronic obstructive pulmonary disease.  6.  History of tobacco abuse, none since September 2006.  7.  History of methicillin-resistant Staphylococcus aureus bacteremia in      September 2006, during a hospitalization for pneumonia and complicated      by ventilator-dependent respiratory failure.  8.  Leukopenia secondary to Seroquel, now not on Seroquel.  9.  Hypertension.  10. Gastroesophageal reflux disease.  11. Hyperlipidemia.   ALLERGIES:  CODEINE and SULFA.   FOLLOW UP:  Follow up with Dr. Kizzie Furnish and Dr. [name] in 1 week.   HOSPITAL COURSE:  The patient is a 55 year old gentleman followed by Dr.  Daine Floras who has been hospitalized and treated on multiple occasions for  recurrent pneumonia.  He also has a history of asthma and tobacco abuse with  which she quit and September 2006.  In any event, he presented to his  primary care doctor several days prior to admission with fever, cough and  malaise.  He was started on Tamiflu and he states prednisone.  He presented  to the emergency room because of worsening symptoms and some increased  shortness of breath.  His chest x-ray revealed a right lower lobe/middle  lobe infiltrate.  His initial WBC was 3.2 with 67% neutrophils, 20% lymphs  and 5% eosinophils.  Over the course of the hospital stay, his white blood  cell count progressively decreased to a low of 1.6 and his eosinophil count  progressively increased to a high of 11%.  He had no  fever in the hospital  stay.  Review of his records reveals that he has had an eosinophilia on  almost every differential that was obtained over the course of the last  year.  He was also noted to be anemic with iron studies consistent with  chronic disease with total iron of 22, TIBC 211, saturation 10% and ferritin  361.  Sedimentation rate was elevated at 127.  ANA was negative.  Chest x-  ray reveals increased infiltrates, diffuse in the right lung and some in the  left lower lung as well.  Because of these findings and the fact  eosinophilia was always associated with his symptoms with the exception of  his hospitalization in September for MRSA bacteremia, which was associated  with sepsis, it is questioned whether he has another illness and not  necessarily infectious.  In any event, the patient also had a CT scan in  September 2006, during that hospitalization which revealed some old  granulomatous disease, but no acute findings.  In any event,  because of the  patient's marked improvement on steroids, his O2 saturations are 95% on room  air.  He is walking about without difficulty and appetite is returning.  He  feels quite well.  He is being discharged to home with outpatient follow-up.  These findings were shared with Dr. [name] and we will follow them up as an  outpatient as well as his primary care physician, Dr. Kizzie Furnish.   DISCHARGE PHYSICAL EXAMINATION:  GENERAL:  At the time of discharge, he is a  slightly overweight gentleman who is alert and appropriate in no distress.  VITAL SIGNS:  Blood pressure is 99/60, pulse 88 and regular, temperature is  afebrile, O2 saturations 95% on room air, respirations are unlabored.  LUNGS:  Lungs were diminished, but clear bilaterally without rales or  rhonchi.  There is no cough, wheezing or other significant findings.  NECK:  He has no JVD or adenopathy.  HEART:  Heart is regular without murmurs.  ABDOMEN:  Abdomen is obese, soft.   EXTREMITIES:  He has no edema.  NEUROLOGIC:  Neurologic exam is completely intact.  SKIN:  Cannot appreciate any rash or lesions noted.   ASSESSMENT/PLAN:  1.  Recurrent pneumonia.  2.  Leukopenia with eosinophilia.  3.  Anemia.  4.  History of tobacco abuse.   DISPOSITION:  The patient is being discharged to home with the plan as noted  above.  No further hematologic workup was performed during his hospital stay  including HIV, etc., but this will be deferred to an outpatient workup.      Vanetta Mulders Hillery Jacks, M.D.  Electronically Signed     FED/MEDQ  D:  09/04/2005  T:  09/04/2005  Job:  [id number]

## 2010-11-24 NOTE — Group Therapy Note (Signed)
NAMEKIAM, BRANSFIELD                ACCOUNT NO.:  [id number]   MEDICAL RECORD NO.:  [id number]          PATIENT TYPE:  INP   LOCATION:  A226                          FACILITY:  [facility]   PHYSICIAN:  Estill Bamberg. Karie Kirks, M.D.DATE OF BIRTH:  28-Jun-1956   DATE OF PROCEDURE:  DATE OF DISCHARGE:                                   PROGRESS NOTE   SUBJECTIVE:  He had a little bit of diarrhea last night and a little bit of  shortness of breath but generally feels better this morning than yesterday.   OBJECTIVE:  Temperature 98.5, pulse 102, respiratory rate 21, blood pressure  113/74.  He is oriented, alert, no acute distress.  Well developed, well  nourished.  He is on oxygen.  His color is pretty good.  Lungs are actually  fairly clear throughout with no expiratory wheezing or rhonchi but some  decreased breath sounds throughout.  He is moving air well.  No intercostal  retractions.  Heart has a regular rhythm, rate of 90.  There is no edema in  the ankles.   Reviewed his CT scan of the chest with contrast done yesterday.  He has got  emphysematous changes in the apices, and he has got some calcified  granulomas in the right upper lobe but unchanged from 03/24/2003 CT scan.   ASSESSMENT:  1.  Chronic obstructive pulmonary disease exacerbated by allergy.  2.  Tobacco use disorder, now off cigarettes for over a week.  3.  Calcified granulomas of the right upper lobe, stable.      Estill Bamberg. Karie Kirks, M.D.  Electronically Signed     SDK/MEDQ  D:  03/20/2005  T:  03/20/2005  Job:  [id number]

## 2010-11-24 NOTE — Group Therapy Note (Signed)
NAMEROQUE, SCHILL                ACCOUNT NO.:  [id number]   MEDICAL RECORD NO.:  [id number]          PATIENT TYPE:  INP   LOCATION:  A226                          FACILITY:  [facility]   PHYSICIAN:  Estill Bamberg. Karie Kirks, M.D.DATE OF BIRTH:  02-02-1956   DATE OF PROCEDURE:  03/18/2005  DATE OF DISCHARGE:                                   PROGRESS NOTE   SUBJECTIVE:  He is feeling somewhat better than yesterday.  Wife notes that  he has smoked since age 55 to his current age of 86 averaging a pack a day  x28 years a pack a day smoking.   OBJECTIVE:  VITAL SIGNS:  Temperature 97.8, pulse 98, respirations 20, blood  pressure 99/54.  GENERAL:  Sitting up in bed breathing.  He is in moderate respiratory  distress, improved from yesterday.  He is not as anxious as he was at that  time.  He is oriented.  He is well-developed, well-nourished.  LUNGS:  Decreased breath sounds.  Still some expiratory wheezing, but this  has improved.  He does have multiple calcified granulomas noted in the lung.  There is no change in this compared to last chest x-ray.  HEART:  Regular rate and rhythm at about 90.  Heart sounds somewhat faint.  ABDOMEN:  Soft.  EXTREMITIES:  No edema of the ankles.   Reviewed his blood work again and he did have an eosinophil level of 11%  when he came in.   ASSESSMENT:  1.  Chronic obstructive pulmonary disease exacerbation, probably from      allergy such as from pollen.  2.  Tobacco use disorder.  3.  Multiple calcified granulomas of lung, questionable etiology.   PLAN:  1.  IV steroids.  2.  Computed tomography scan of the lung with contrast tomorrow.  3.  Family is adamant about him being of the cigarettes starting now.  4.  Will do pulmonary function test at a later date when his lung function      is back to his baseline.      Estill Bamberg. Karie Kirks, M.D.  Electronically Signed     SDK/MEDQ  D:  03/18/2005  T:  03/19/2005  Job:  [id number]

## 2010-11-24 NOTE — Procedures (Signed)
NAMEGILFORD, [name]                ACCOUNT NO.:  [id number]   MEDICAL RECORD NO.:  [id number]          PATIENT TYPE:  OUT   LOCATION:                                FACILITY:  [facility]   PHYSICIAN:  Edward L. [name], M.D.DATE OF BIRTH:  1955-09-01   DATE OF PROCEDURE:  09/26/2005  DATE OF DISCHARGE:                              PULMONARY FUNCTION TEST   RESULTS:  1.  Spirometry shows a moderate ventilatory defect with airflow obstruction.  2.  Lung volumes are normal.  Lung volumes show TLC of 79% of predicted with      80% normal.  This is close to normal.  3.  DLCO is 74% of predicted, again 80% is normal so it is mildly reduced.  4.  Arterial blood gas is normal.  5.  There is no significant bronchodilator response.      Edward L. [name], M.D.  Electronically Signed     ELH/MEDQ  D:  09/26/2005  T:  09/27/2005  Job:  [id number]

## 2010-11-24 NOTE — H&P (Signed)
NAMEBERLE, FITZ                ACCOUNT NO.:  [id number]   MEDICAL RECORD NO.:  [id number]          PATIENT TYPE:  INP   LOCATION:  A226                          FACILITY:  [facility]   PHYSICIAN:  Estill Bamberg. Karie Kirks, M.D.DATE OF BIRTH:  1956-03-12   DATE OF ADMISSION:  03/17/2005  DATE OF DISCHARGE:  LH                                HISTORY & PHYSICAL   HISTORY OF PRESENT ILLNESS:  A 55 year old man who has been getting  progressively more short of breath during the week prior to admission,  finally had to present to the emergency room last night.   He had a similar bout of shortness of breath two years ago in 2004, and was  admitted to the hospital from September 14 to September 19, with at that  time pneumonia, reactive airway disease, mild left ventricular dysfunction,  gastroesophageal reflux disease, tobacco use disorder.  He also has a  history of hyperlipidemia.   He was seen by Dr. Mare Ferrari, his cardiologist, just about a week ago.   Apparently, he is still smoking, but there is no history of alcohol or  drugs.  Apparently, he is using Diltiazem 240 mg daily, Tricor 145 mg daily,  amitriptyline 50 mg q.h.s., and a multivitamin daily.   He denied any other lung problems.  He had no fever or chills.  He was  coughing up some clear sputum, but no thick yellow sputum.  He was not  having chest pain.  Denied palpitations or any GI or GU symptomatology.   At the time of my review of his hospitalization, I looked at his x-ray  report.  He did have a chest x-ray done two weeks ago which showed evidence  for prior granuloma disease.  No extra reports in the electronic record at  this time.   PHYSICAL EXAMINATION:  GENERAL:  The patient was somewhat dyspneic.  He is  well-developed, well-nourished.  VITAL SIGNS:  Temperature 96.5, pulse 115, respiratory rate 20, blood  pressure 131/69.  PSYCHIATRIC:  He appeared to be a good historian, but somewhat anxious.  Sentence structure  was intact.  He appeared oriented and alert.  HEENT:  Pharynx was normal.  NECK:  Negative anterior cervical nodes.  There were no axillary or  supraclavicular adenopathy.  LUNGS:  Decreased breath sounds throughout, but he is moving air well  without intercostal retractions or use of accessory muscles of respirations.  HEART:  Regular rhythm, rate of about 100, without murmurs.  ABDOMEN:  Soft, without hepatosplenomegaly or mass.  EXTREMITIES:  No edema of the ankles.   LABORATORY DATA:  His blood gases showed a pH of 7.32, PCO2 of 51, PO2 of  86.  White blood cell count is 7100.  Met-7 showed a glucose of 121.   ASSESSMENT:  1.  Probable chronic obstructive pulmonary disease exacerbation.  2.  Tobacco use disorder.  3.  Mild left ventricular dysfunction.  4.  Gastroesophageal reflux disease.  5.  Hyperlipidemia.   PLAN:  We are seeing if he did have a chest x-ray, if not, I am ordering it.  Also, I am getting a D-dimer and lipid's.  Meanwhile, continue on his  albuterol and Atrovent nebulizer treatments q.4h., Solu-Medrol 125 mg IV  q.6h., O2 at 2 L by nasal cannula, Nicoderm patch 21 mg daily, IV normal  saline TKO, Diltiazem ER 240 mg daily, Tricor 145 mg q.h.s., amitriptyline  50 mg q.h.s., and a multivitamin daily with ASA 81 mg daily.      Estill Bamberg. Karie Kirks, M.D.  Electronically Signed     SDK/MEDQ  D:  03/17/2005  T:  03/17/2005  Job:  [id number]

## 2010-11-24 NOTE — H&P (Signed)
NAME:  [name], [name]                ACCOUNT NO.:  [id number]   MEDICAL RECORD NO.:  [id number]          PATIENT TYPE:  INP   LOCATION:  A219                          FACILITY:  [facility]   PHYSICIAN:  Audria Nine, M.D.DATE OF BIRTH:  [birth year]   DATE OF ADMISSION:  08/30/2005  DATE OF DISCHARGE:  LH                                HISTORY & PHYSICAL   ADMISSION DIAGNOSES:  1.  Right lobar pneumonia.  2.  Dehydration.  3.  Hypokalemia.  4.  History of methicillin-resistant Staphylococcus aureus pneumonia in      September of 2006.  5.  Chronic obstructive pulmonary disease.   PRIMARY CARE PHYSICIAN:  Shann Medal. Kizzie Furnish, M.D.   CHIEF COMPLAINT:  Fever of 3 days' duration.   HISTORY OF PRESENT ILLNESS:  [name] is a 55 year old Caucasian male who  presented to the emergency room today with a fever of 102.3 at home.  He  reports that a fever has been ongoing for the past 3 days, and actually went  in to see Dr. Kizzie Furnish, his primary care physician, on Monday, who thought that  this could be the flu, and started him on Tamiflu 75 mg b.i.d.  The patient  reports waxing and waning fever with some chills and rigors and subsequently  defervescence.   He had some nausea with decreased appetite, but no vomiting, no abdominal  pain, no diarrhea.  He has had no headaches, dizziness or lightheadedness.   He called Dr. Kizzie Furnish today, who said he was not feeling well.  He advised him  to come to the emergency room.   Evaluation in the emergency room revealed a right lobar infiltrate,  consistent with pneumonia.   The patient was admitted here back in September of 2006 for a right lower  lobe and a right middle lobe pneumonia, which was thought to be hospital  acquired, and he was hypoxic and in respiratory failure leading to  mechanical ventilation.  He also had a MRSA bacteremia with positive blood  cultures.  The patient was in [facility] for about 15 days.  The  patient was  discharged home on April 11, 2005 without any additional  incidents.   The patient had received ceftriaxone and Zithromax in the emergency room.   REVIEW OF SYSTEMS:  A 10-point review of systems otherwise negative.  He  denies any hemoptysis.  No weight loss, no night sweats.   PAST MEDICAL HISTORY:  1.  Right lower lobe/right middle lobe pneumonia, likely hospital acquired,      in October 2006.  2.  Chronic obstructive pulmonary disease.  3.  Methicillin-resistant Staphylococcus aureus bacteremia in September of      2006.  4.  Methicillin-resistant Staphylococcus aureus urinary tract infection in      September of 2006.  5.  Severe sepsis.  6.  Leukopenia secondary to Seroquel.  7.  Hypertension.  8.  Gastroesophageal reflux disease.   MEDICATIONS:  1.  Cardizem 240 mg p.o. once a day.  2.  Protonix 40 mg p.o. before breakfast.  3.  Aspirin 81 mg  p.o. once a day.  4.  Ambien 5 mg p.o. at bedtime as needed.  5.  Ferrous sulfate 325 mg p.o. t.i.d.  6.  Tricor 145 mg p.o. once a day.   ALLERGIES:  1.  CODEINE.  2.  SULFA.   SOCIAL HISTORY:  The patient is married.  He lives with his wife.  They have  1 child.  He quit smoking back in September of 2006 and has not smoked since  then.  He has a 25 pack year history of smoking.  He denies alcohol or  illicit drug use.  He continue to work for the [employer]   FAMILY HISTORY:  Father died of lung disease.  He had emphysema, asthma and  hypertension.  Mother is alive with hypertension and diabetes.  She has a  history of lung cancer with resection.  He has 6 sisters.  He also has 1  brother who has diabetes, and another one with hypertension.   PHYSICAL EXAMINATION:  GENERAL:  The patient is alert, comfortable, not in  acute distress.  He is alert and oriented to time, place and person.  VITAL SIGNS:  Blood pressure on evaluation on arrival was 103/58,  temperature 102.5, respirations 36, pulse of 128.  Oxygen  saturation was 94%  on room air.  Blood pressure was noted to drop to 90/49, prompting bolus  infusions in the emergency room.  HEENT:  Normocephalic and atraumatic.  Oral mucosa was dry.  No exudates  were noted.  NECK:  Supple.  No JVD or lymphadenopathy.  LUNGS:  Crackles, especially on the right side, but there were also some on  the left, but greater on the right.  No expiratory wheezes or rhonchi were  heard.  HEART:  S1 and S2.  Tachycardic.  No S3, S4, gallops or rubs.  ABDOMEN:  Soft, nontender.  Bowel sounds were positive.  No masses palpable.  No tenderness.  EXTREMITIES:  No pitting or pedal edema.  No calf induration was noted.  CNS:  Grossly intact with no focal deficits.   LABORATORY/DIAGNOSTIC DATA:  White blood cell count of 3.2, hemoglobin 12.2,  hematocrit 34.9, platelet count of 217.  There was no left shift.  Sodium  was 1.5, potassium 3.4, chloride 106, CO2 of 23, glucose 104, BUN 13,  creatinine 1.1.  AST and ALT were normal.  Albumin was 3.0, calcium of 8.3.  Urinalysis was negative.   CHEST X-RAY:  Diffuse air space opacities, predominantly in the right lung.   ASSESSMENT AND PLAN:  [name] is a 55 year old Caucasian male who is  admitted now for pneumonia.  I am concerned that this is a second pneumonia  in about 4 or 5 months.   He did have a history of methicillin-resistant Staphylococcus aureus  bacteremia and was transferred to [facility] intensive care unit  because he was intubated as a result of hypoxic respiratory failure.  The  patient came in slightly dehydrated with evidence of systemic inflammatory  response syndrome.  The patient has improved slightly with IV fluid therapy.  Our plan is to admit him at this time.  Will give him additional boluses of  normal saline.  I will switch his antibiotics to Levaquin 750 mg IV once a day, and will also vancomycin 1 gm IV q.12h. pending the results of the  sputum culture, which I have  requested.  I am concerned, as the patient has  a prior history of methicillin-resistant Staphylococcus  aureus, that this  may be a recurrence of it.  I think if we can get a good induced sputum, and  if it is negative, the vancomycin may be safely discontinued and the patient  kept on Levaquin.   I will be holding the Cardizem and only give if his blood pressure exceeds  100.  Deep vein thrombosis prophylaxis with Lovenox.  GI prophylaxis with  Protonix.  Will replace his potassium through IV fluids.   Will have respiratory therapy evaluate and treat him and put him on  nebulizers  p.r.n.  The patient will also be on MRSA isolation/precautions.      Audria Nine, M.D.  Electronically Signed     AM/MEDQ  D:  08/30/2005  T:  08/31/2005  Job:  [id number]

## 2010-11-24 NOTE — Group Therapy Note (Signed)
   NAME:  [name], [name]                          ACCOUNT NO.:  [id number]   MEDICAL RECORD NO.:  [id number]                   PATIENT TYPE:  INP   LOCATION:  A215                                 FACILITY:  [facility]   PHYSICIAN:  Angus G. Everette Rank, M.D.              DATE OF BIRTH:  1955-10-16   DATE OF PROCEDURE:  03/25/2003  DATE OF DISCHARGE:                                   PROGRESS NOTE   SUBJECTIVE:  This patient was admitted to the hospital with  bronchopneumonia, reactive airways disease, gastroesophageal reflux.  He was  started on Xopenex q.8h.  The patient is being seen as well by Dr. [name].   OBJECTIVE:  Vital signs:  Blood pressure 134/71, respirations 22, pulse 122,  temperature 97.1.  Lungs:  Coarse breath sounds throughout all lung fields.  Heart:  Sinus tachycardia.  Abdomen:  No palpable organs or masses.   ASSESSMENT:  The patient was admitted to the hospital with bronchopneumonia,  gastroesophageal reflux.   PLAN:  Continue current regimen.                                               Angus G. Everette Rank, M.D.    AGM/MEDQ  D:  03/25/2003  T:  03/25/2003  Job:  [id number]

## 2010-11-24 NOTE — Group Therapy Note (Signed)
   NAME:  [name], [name]                          ACCOUNT NO.:  [id number]   MEDICAL RECORD NO.:  [id number]                   PATIENT TYPE:  INP   LOCATION:  A215                                 FACILITY:  [facility]   PHYSICIAN:  Edward L. [name], M.D.             DATE OF BIRTH:  07-07-1956   DATE OF PROCEDURE:  03/25/2003  DATE OF DISCHARGE:                                   PROGRESS NOTE   PROBLEM LIST:  1. Asthmatic bronchitis, severe.  2. Hypertension.  3. Tachycardia.   SUBJECTIVE:  [name] says he is feeling better this morning.  He has less  congestion and he is tolerating his nebulizer treatments better as well.  He  is back on Cardizem but I did not restart his Toprol-XL because of his  bronchospasm.  He says he is overall feeling better and he is coughing up a  little bit of sputum.   OBJECTIVE:  Exam today shows his temperature is 97.1, pulse 95, respirations  22, blood pressure 113/62, O2 saturation is 94% on 2 liters.  His chest  still shows fairly marked bilateral wheezes.   ASSESSMENT:  He continues to have some problems with wheezing and congestion  but he is overall better.   PLAN:  Add Humibid LA two b.i.d. and see if it makes any difference in his  congestion and see if he can cough anything up any better.  No other new  changes today.                                               Edward L. [name], M.D.    ELH/MEDQ  D:  03/25/2003  T:  03/25/2003  Job:  [id number]

## 2010-11-24 NOTE — Group Therapy Note (Signed)
   NAME:  [name], [name]                          ACCOUNT NO.:  [id number]   MEDICAL RECORD NO.:  [id number]                   PATIENT TYPE:   LOCATION:                                       FACILITY:   PHYSICIAN:  Estill Bamberg. Karie Kirks, M.D.           DATE OF BIRTH:  Jul 09, 1956   DATE OF PROCEDURE:  DATE OF DISCHARGE:                                   PROGRESS NOTE   Suddenly, patient does feel better.  One bad coughing episode yesterday.  He  stayed without oxygen last night.  He is able to get up and around, but he  is somewhat short of breath.   OBJECTIVE:  VITAL SIGNS:  Temperature 97.1, pulse 84, respirations 20, blood  pressure 108/59.  LUNGS:  He is moving air well, no intercostal tractions, no use of accessory  muscles respiration.  He is somewhat barrel chested, has some Cushingoid  facies.  Fairly clear throughout, however.  He does appear somewhat  dyspneic, however.  HEART:  Regular rate of 70.  EXTREMITIES:  There is no edema in the ankles.  ______________ normal.   ASSESSMENT:  Asthmatic bronchitis, we are continuing appropriately per plan.  If he continues to do well today, he will be discharged home tomorrow.  Meanwhile, a decrease in his prednisone from 20 mg b.i.d. to 10 mg b.i.d.  We will continue his Levaquin 500 mg q.d., Xopenex by nebulizer q.8h.,  Robitussin DM 2 tsp q.i.d., diltiazem SR 180 mg q.a.m., and __________  q.h.s.                                                 Estill Bamberg. Karie Kirks, M.D.    SDK/MEDQ  D:  03/27/2003  T:  03/27/2003  Job:  [id number]

## 2010-11-24 NOTE — Discharge Summary (Signed)
NAME:  [name], [name]                          ACCOUNT NO.:  [id number]   MEDICAL RECORD NO.:  [id number]                   PATIENT TYPE:  INP   LOCATION:  A215                                 FACILITY:  [facility]   PHYSICIAN:  Estill Bamberg. Karie Kirks, M.D.           DATE OF BIRTH:  [birth year]   DATE OF ADMISSION:  03/23/2003  DATE OF DISCHARGE:  03/28/2003                                 DISCHARGE SUMMARY   This 55 year old male was admitted to the hospital with asthmatic  bronchitis.  He had a fairly benign six-day hospital course extending from  September 14 to March 28, 2003.  Vital signs remained stable.   His admission CBC showed 6400 white cells of which 65% were neutrophils, 19  lymphs, 4 monos.  There were also 11 eosinophils.  His CMP and UA were  negative.  Two blood cultures were normal.  His chest x-ray done the day  before admission showed increased densities in the right lower lobe, concern  for developing infiltrate, and some bronchitic changes.  Because of this he  had already been started on Levaquin 500 mg daily.   On the day of admission he was wheezing so badly and was so short of breath  it was felt we just absolutely needed to admit him to the hospital because  of dyspnea.  His repeat chest x-ray suspicious for bronchiectasis right  lower lobe with no definite infiltrate.  He then had a chest CT which showed  no right lower lobe infiltrate.   There is said to be paraseptal emphysema at the lung apices.  Also noted  diffuse fatty infiltration of the liver.  Some granulomatous disease of the  lung and probably cysts of the left kidney.   He was admitted to the hospital, started on O2 at 2 L/min by nasal prongs  with albuterol nebulizer treatments q.4h. while awake and Rocephin 1 g IV  q.24h., Levaquin 500 mg daily, and Robitussin-DM 2 teaspoons q.4h. while  awake.  Later that day, given the eosinophil count, he was started on Solu-  Medrol 125 mg IV q.6h.   The  followed day consulted Dr. [name] after nursing told me he had had a  severe coughing spell and collapsed on the floor, with some confusion after  this.   His albuterol was discontinued, he was put on Xopenex q.8h.  We transferred  him down from the third floor to the second floor so he could be on the  monitor and put him on Cardizem 60 mg q.6h., Elavil 50 mg q.h.s.  He was  given Humibid L.A. 2 tablets b.i.d.   By his third day, methylprednisolone was discontinued, and he was put on  prednisone 20 mg b.i.d., and Rocephin was discontinued.   At this time his O2 saturations were better, on room air they would be in  the low 90s.  He was still having coughing spells, but  they were not as  severe.   Prednisone was further decreased to 10 mg b.i.d. on his fifth day, and we  switched him to diltiazem SR 180 mg q.a.m.   By his sixth day color was good off oxygen, and his O2 saturation was 96%.  He was ready for discharge home.    DISCHARGE MEDICATIONS:  1. Levaquin 500 mg daily (to finish up his original prescription).  2. Prednisone 10 mg daily (30 __________ ).  3. Albuterol 2 puffs q.i.d. (use the inhaler) (refill x2).  4. Diltiazem SR 180 mg daily (30 __________ x2).  5. Robitussin-DM 2 teaspoons q.i.d. for cough.   FOLLOW-UP:  In the office in a week.   FINAL DISCHARGE DIAGNOSIS:  Asthmatic bronchitis.      ___________________________________________                                            Estill Bamberg. Karie Kirks, M.D.   SDK/MEDQ  D:  03/28/2003  T:  03/29/2003  Job:  [id number]

## 2010-11-24 NOTE — Group Therapy Note (Signed)
   NAME:  [name], [name]                          ACCOUNT NO.:  [id number]   MEDICAL RECORD NO.:  [id number]                   PATIENT TYPE:  INP   LOCATION:  A215                                 FACILITY:  [facility]   PHYSICIAN:  Edward L. [name], M.D.             DATE OF BIRTH:  [birth year]   DATE OF PROCEDURE:  03/26/2003  DATE OF DISCHARGE:                                   PROGRESS NOTE   SUBJECTIVE:  Mr. Swim says he is much better and indeed he sounds much  better today.  He does not appear to be short of breath on simple  observation and yesterday he was dyspneic even at rest.   OBJECTIVE:  His exam today shows his temperature is 97.5, pulse 93,  respirations 20, blood pressure 116/61, O2 saturation is 95% on 2 liters.  His I&O is +480.  His chest is much clearer, he was really not wheezing at  all right now.   ASSESSMENT:  He is improving.   PLAN:  I would continue with current treatments as it seems to be working,  he is getting better, and I do believe that this is an asthmatic bronchitis.                                               Edward L. [name], M.D.    ELH/MEDQ  D:  03/26/2003  T:  03/26/2003  Job:  [id number]

## 2010-11-24 NOTE — H&P (Signed)
NAMEHASKEL, DEWALT                ACCOUNT NO.:  [id number]   MEDICAL RECORD NO.:  [id number]          PATIENT TYPE:  INP   LOCATION:  A226                          FACILITY:  [facility]   PHYSICIAN:  Estill Bamberg. Karie Kirks, M.D.DATE OF BIRTH:  11/05/1955   DATE OF ADMISSION:  03/17/2005  DATE OF DISCHARGE:  LH                                HISTORY & PHYSICAL   ADDENDUM:  His admission meds include furosemide 40 mg daily, KCl 20 mEq  daily, diltiazem ER 120 mg daily, tramadol 50 mg one to two daily p.r.n.  pain, Tricor 160 mg at bedtime, amitriptyline 50 mg at bedtime and  metoprolol 50 mg half a tablet b.i.d.  He is allergic to CODEINE.      Estill Bamberg. Karie Kirks, M.D.  Electronically Signed     SDK/MEDQ  D:  03/17/2005  T:  03/17/2005  Job:  [id number]

## 2011-07-11 ENCOUNTER — Telehealth: Payer: Self-pay

## 2011-07-11 NOTE — Telephone Encounter (Signed)
LMOM to call.

## 2011-07-19 NOTE — Telephone Encounter (Signed)
Letter mailed for pt to call.

## 2011-07-23 ENCOUNTER — Telehealth: Payer: Self-pay

## 2011-07-23 NOTE — Telephone Encounter (Signed)
Spoke with pt's wife. She will call back tomorrow with the remaining info of medss. Dosage, frequency and insurance info.

## 2011-07-24 NOTE — Telephone Encounter (Signed)
Gastroenterology Pre-Procedure Form   Request Date: 07/24/2011      Requesting Physician: Dr. [name]   PATIENT INFORMATION:  [name] is a 56 y.o., male (DOB=04/22/1956).  PROCEDURE: Procedure(s) requested: colonoscopy Procedure Reason: screening for colon cancer  PATIENT REVIEW QUESTIONS: The patient reports the following:   1. Diabetes Melitis: yes  2. Joint replacements in the past 12 months: no 3. Major health problems in the past 3 months: no 4. Has an artificial valve or MVP:no 5. Has been advised in past to take antibiotics in advance of a procedure like teeth cleaning: no}    MEDICATIONS & ALLERGIES:    Patient reports the following regarding taking any blood thinners:   Plavix? no Aspirin?yes  Coumadin?  no  Patient confirms/reports the following medications:  Current Outpatient Prescriptions  Medication Sig Dispense Refill  . albuterol (2.5 MG/3ML) 0.083% NEBU 3 mL, albuterol (5 MG/ML) 0.5% NEBU 0.5 mL Inhale into the lungs. Only uses occasionally      . aspirin 81 MG tablet Take 81 mg by mouth daily.      . Choline Fenofibrate (TRILIPIX) 135 MG capsule Take 135 mg by mouth daily.      [name] diltiazem (DILACOR XR) 240 MG 24 hr capsule Take 240 mg by mouth daily.      [name] ipratropium-albuterol (DUONEB) 0.5-2.5 (3) MG/3ML SOLN Take 3 mLs by nebulization. Only occasionally      . metFORMIN (GLUCOPHAGE) 500 MG tablet Take 500 mg by mouth 2 (two) times daily with a meal.      . Multiple Vitamin (MULTIVITAMIN) tablet Take 1 tablet by mouth 2 (two) times daily.      . NON FORMULARY Ventolin Inhaler 18 gm   As needed only      . simvastatin (ZOCOR) 40 MG tablet Take 40 mg by mouth every evening.        Patient confirms/reports the following allergies:  Allergies  Allergen Reactions  . Codeine Rash    Patient is appropriate to schedule for requested procedure(s): yes  AUTHORIZATION INFORMATION Primary Insurance:   ID #:   Group #:  Pre-Cert / Auth required: Pre-Cert  / Auth #:   Secondary Insurance:   ID #:  Group #:  Pre-Cert / Auth required:  Pre-Cert / Auth #:   No orders of the defined types were placed in this encounter.    SCHEDULE INFORMATION: Procedure has been scheduled as follows:  Date: 08/31/2011      Time: 8:15 AM  Location: [facility] Short Stay  This Gastroenterology Pre-Precedure Form is being routed to the following provider(s) for review: R. Garfield Cornea, MD

## 2011-07-25 ENCOUNTER — Other Ambulatory Visit: Payer: Self-pay

## 2011-07-25 NOTE — Telephone Encounter (Signed)
Rx and instructions mailed to pt.

## 2011-07-25 NOTE — Telephone Encounter (Signed)
Day of prep: metformin 224m bid. OK to schedule.

## 2011-08-27 ENCOUNTER — Telehealth: Payer: Self-pay

## 2011-08-27 NOTE — Telephone Encounter (Signed)
LMOM for a return call to update any new meds or new medical history prior to procedure on 08/31/2011.

## 2011-08-29 NOTE — Telephone Encounter (Signed)
Noted. Instructions same as before as far as diabetes meds.

## 2011-08-29 NOTE — Telephone Encounter (Signed)
Called and LMOM to call to updates meds and any new problems prior to his procedure on 08/31/2011.

## 2011-08-29 NOTE — Telephone Encounter (Signed)
Pt returned call and said he has not had any change in his medications and no new medical problems. He is scheduled for colonoscopy with Dr. [name] on 08/31/2011.

## 2011-08-30 ENCOUNTER — Encounter (HOSPITAL_COMMUNITY): Payer: Self-pay | Admitting: Pharmacy Technician

## 2011-08-31 ENCOUNTER — Ambulatory Visit (HOSPITAL_COMMUNITY)
Admission: RE | Admit: 2011-08-31 | Discharge: 2011-08-31 | Disposition: A | Discharge status: Home / Self Care | Payer: 59 | Source: Ambulatory Visit | Attending: Internal Medicine | Admitting: Internal Medicine

## 2011-08-31 ENCOUNTER — Other Ambulatory Visit: Payer: Self-pay | Admitting: Internal Medicine

## 2011-08-31 ENCOUNTER — Encounter (HOSPITAL_COMMUNITY)
Admission: RE | Disposition: A | Discharge status: Home / Self Care | Payer: Self-pay | Source: Ambulatory Visit | Attending: Internal Medicine

## 2011-08-31 ENCOUNTER — Encounter (HOSPITAL_COMMUNITY): Payer: Self-pay

## 2011-08-31 DIAGNOSIS — D128 Benign neoplasm of rectum: Secondary | ICD-10-CM | POA: Insufficient documentation

## 2011-08-31 DIAGNOSIS — K620 Anal polyp: Secondary | ICD-10-CM

## 2011-08-31 DIAGNOSIS — Z83719 Family history of colon polyps, unspecified: Secondary | ICD-10-CM | POA: Insufficient documentation

## 2011-08-31 DIAGNOSIS — Z01812 Encounter for preprocedural laboratory examination: Secondary | ICD-10-CM | POA: Insufficient documentation

## 2011-08-31 DIAGNOSIS — E119 Type 2 diabetes mellitus without complications: Secondary | ICD-10-CM | POA: Insufficient documentation

## 2011-08-31 DIAGNOSIS — Z8371 Family history of colonic polyps: Secondary | ICD-10-CM | POA: Insufficient documentation

## 2011-08-31 DIAGNOSIS — D126 Benign neoplasm of colon, unspecified: Secondary | ICD-10-CM | POA: Insufficient documentation

## 2011-08-31 DIAGNOSIS — K621 Rectal polyp: Secondary | ICD-10-CM

## 2011-08-31 DIAGNOSIS — Z1211 Encounter for screening for malignant neoplasm of colon: Secondary | ICD-10-CM

## 2011-08-31 HISTORY — PX: COLONOSCOPY: SHX5424

## 2011-08-31 HISTORY — DX: Gastro-esophageal reflux disease without esophagitis: K21.9

## 2011-08-31 HISTORY — DX: Cardiac arrhythmia, unspecified: I49.9

## 2011-08-31 HISTORY — DX: Other specified postprocedural states: Z98.890

## 2011-08-31 HISTORY — DX: Nausea with vomiting, unspecified: R11.2

## 2011-08-31 LAB — GLUCOSE, CAPILLARY

## 2011-08-31 SURGERY — COLONOSCOPY
Anesthesia: Moderate Sedation

## 2011-08-31 MED ORDER — SODIUM CHLORIDE 0.9 % IJ SOLN
INTRAMUSCULAR | Status: AC
  Filled 2011-08-31: qty 10

## 2011-08-31 MED ORDER — STERILE WATER FOR IRRIGATION IR SOLN
Status: DC | PRN
  Administered 2011-08-31: 09:00:00

## 2011-08-31 MED ORDER — PROMETHAZINE HCL 25 MG/ML IJ SOLN: 12.5000 mg | Freq: Once | INTRAMUSCULAR | Status: DC

## 2011-08-31 MED ORDER — MIDAZOLAM HCL 5 MG/5ML IJ SOLN
INTRAMUSCULAR | Status: DC | PRN
  Administered 2011-08-31: 2 mg via INTRAVENOUS
  Administered 2011-08-31: 1 mg via INTRAVENOUS
  Administered 2011-08-31: 2 mg via INTRAVENOUS

## 2011-08-31 MED ORDER — SODIUM CHLORIDE 0.45 % IV SOLN
INTRAVENOUS | Status: DC
  Administered 2011-08-31: 08:00:00 via INTRAVENOUS

## 2011-08-31 MED ORDER — ONDANSETRON HCL 4 MG/2ML IJ SOLN
INTRAMUSCULAR | Status: AC
  Filled 2011-08-31: qty 2

## 2011-08-31 MED ORDER — MEPERIDINE HCL 100 MG/ML IJ SOLN
INTRAMUSCULAR | Status: DC | PRN
  Administered 2011-08-31: 50 mg via INTRAVENOUS
  Administered 2011-08-31: 25 mg via INTRAVENOUS

## 2011-08-31 MED ORDER — MEPERIDINE HCL 100 MG/ML IJ SOLN
INTRAMUSCULAR | Status: AC
  Filled 2011-08-31: qty 2

## 2011-08-31 MED ORDER — PROMETHAZINE HCL 25 MG/ML IJ SOLN
INTRAMUSCULAR | Status: AC
  Administered 2011-08-31: 12.5 mg via INTRAVENOUS
  Filled 2011-08-31: qty 1

## 2011-08-31 MED ORDER — ONDANSETRON HCL 4 MG/2ML IJ SOLN
INTRAMUSCULAR | Status: DC | PRN
  Administered 2011-08-31: 4 mg via INTRAVENOUS

## 2011-08-31 MED ORDER — MIDAZOLAM HCL 5 MG/5ML IJ SOLN
INTRAMUSCULAR | Status: AC
  Filled 2011-08-31: qty 10

## 2011-08-31 MED ORDER — PROMETHAZINE HCL 25 MG/ML IJ SOLN
12.5000 mg | Freq: Once | INTRAMUSCULAR | Status: AC
  Administered 2011-08-31: 12.5 mg via INTRAVENOUS

## 2011-08-31 NOTE — Op Note (Signed)
[facility] [address] [place], [place]  [address]  COLONOSCOPY PROCEDURE REPORT  PATIENT:  [name], [name]  MR#:  [id number] BIRTHDATE:  01/15/1956, 48 yrs. old  GENDER:  male ENDOSCOPIST:  R. Garfield Cornea, MD FACP [facility] REF. BY:  Sinda Du, M.D. PROCEDURE DATE:  08/31/2011 PROCEDURE:  Colonoscopy with biopsy, snare polypectomy and polyp ablation  INDICATIONS:  first ever screening colonoscopy. Mother with history of colonic polyps.  INFORMED CONSENT:  The risks, benefits, alternatives and imponderables including but not limited to bleeding, perforation as well as the possibility of a missed lesion have been reviewed. The potential for biopsy, lesion removal, etc. have also been discussed.  Questions have been answered.  All parties agreeable. Please see the history and physical in the medical record for more information.  MEDICATIONS:  Versed 5 mg IV and Demerol 75 mg IV in divided doses. Zofran 4 mg IV at the outset of the procedure to prevent post procedure nausea.  DESCRIPTION OF PROCEDURE:  After a digital rectal exam was performed, the EC-[id number] ([id number]) colonoscope was advanced from the anus through the rectum and colon to the area of the cecum, ileocecal valve and appendiceal orifice.  The cecum was deeply intubated.  These structures were well-seen and photographed for the record.  From the level of the cecum and ileocecal valve, the scope was slowly and cautiously withdrawn.  The mucosal surfaces were carefully surveyed utilizing scope tip deflection to facilitate fold flattening as needed.  The scope was pulled down into the rectum where a thorough examination including retroflexion was performed. <<PROCEDUREIMAGES>>  FINDINGS:  Adequate preparation.   multiple 3-5 mm polyps in the sigmoid and descending segments. A 6 mm polyp also found at the splenic                               flexure. Multiple diminutive rectal polyps.  THERAPEUTIC /  DIAGNOSTIC MANEUVERS PERFORMED:   Multiple hot and cold snare polypectomies performed. Cold biopsy removal performed. Diminutive sigmoid and rectal polyps ablated with the tip of the hot                             snare cautery loop.  COMPLICATIONS:  none  CECAL WITHDRAWAL TIME:18 minutes  IMPRESSION:   Multiple rectal and colon polyps-treated as described above.  RECOMMENDATIONS:    Followup on pathology  ______________________________ R. Garfield Cornea, MD Quentin Ore  CC:  Sinda Du, M.D.  n. eSIGNED:   R. Garfield Cornea at 08/31/2011 09:11 AM  Sabas Sous, [phone]

## 2011-08-31 NOTE — H&P (Signed)
Primary Care Physician:  Alonza Bogus, MD, MD Primary Gastroenterologist:  Dr. [name]  Pre-Procedure History & Physical: HPI:  [name] is a 56 y.o. male is here for a screening colonoscopy.   No bowel symptoms. Mother has history of colonic polyps and is in a surveillance program. No family history of CRC.  Past Medical History  Diagnosis Date  . PONV (postoperative nausea and vomiting)   . Asthma   . Dysrhythmia   . Diabetes mellitus   . GERD (gastroesophageal reflux disease)     Past Surgical History  Procedure Date  . Hernia repair     2010, september  . Vasectomy 1989    Prior to Admission medications   Medication Sig Start Date End Date Taking? Authorizing Provider  albuterol (PROVENTIL HFA;VENTOLIN HFA) 108 (90 BASE) MCG/ACT inhaler Inhale 2 puffs into the lungs every 6 (six) hours as needed. For shortness of breath   Yes Historical Provider, MD  albuterol (PROVENTIL) (2.5 MG/3ML) 0.083% nebulizer solution Take 2.5 mg by nebulization every 6 (six) hours as needed. For shortness of breath   Yes Historical Provider, MD  aspirin EC 81 MG tablet Take 81 mg by mouth daily.   Yes Historical Provider, MD  Choline Fenofibrate (TRILIPIX) 135 MG capsule Take 135 mg by mouth daily.   Yes Historical Provider, MD  diltiazem (DILACOR XR) 240 MG 24 hr capsule Take 240 mg by mouth daily.   Yes Historical Provider, MD  ipratropium-albuterol (DUONEB) 0.5-2.5 (3) MG/3ML SOLN Take 3 mLs by nebulization every 6 (six) hours as needed. For shortness of breath   Yes Historical Provider, MD  metFORMIN (GLUCOPHAGE) 500 MG tablet Take 500 mg by mouth 2 (two) times daily with a meal.   Yes Historical Provider, MD  Multiple Vitamin (MULITIVITAMIN WITH MINERALS) TABS Take 1 tablet by mouth daily.   Yes Historical Provider, MD  simvastatin (ZOCOR) 40 MG tablet Take 40 mg by mouth every evening.   Yes Historical Provider, MD    Allergies as of 07/25/2011 - never reviewed  Allergen Reaction  Noted  . Codeine Rash 07/24/2011    History reviewed. No pertinent family history.  History   Social History  . Marital Status: Married    Spouse Name: N/A    Number of Children: N/A  . Years of Education: N/A   Occupational History  . Not on file.   Social History Main Topics  . Smoking status: Former Research scientist (life sciences)  . Smokeless tobacco: Not on file  . Alcohol Use: No  . Drug Use: No  . Sexually Active:    Other Topics Concern  . Not on file   Social History Narrative  . No narrative on file    Review of Systems: See HPI, otherwise negative ROS  Physical Exam: BP 121/79  Pulse 98  Temp(Src) 97.7 F (36.5 C) (Oral)  Resp 16  Ht 5' 7"  (1.702 m)  Wt 157 lb (71.215 kg)  BMI 24.59 kg/m2  SpO2 91% General:   Alert,  Well-developed, well-nourished, pleasant and cooperative in NAD Head:  Normocephalic and atraumatic. Eyes:  Sclera clear, no icterus.   Conjunctiva pink. Ears:  Normal auditory acuity. Nose:  No deformity, discharge,  or lesions. Mouth:  No deformity or lesions, dentition normal. Neck:  Supple; no masses or thyromegaly. Lungs:  Clear throughout to auscultation.   No wheezes, crackles, or rhonchi. No acute distress. Heart:  Regular rate and rhythm; no murmurs, clicks, rubs,  or gallops. Abdomen: Nondistended. Positive bowel  sounds soft and nontender without appreciable mass or organomegaly Msk:  Symmetrical without gross deformities. Normal posture. Pulses:  Normal pulses noted. Extremities:  Without clubbing or edema. Neurologic:  Alert and  oriented x4;  grossly normal neurologically. Skin:  Intact without significant lesions or rashes. Cervical Nodes:  No significant cervical adenopathy. Psych:  Alert and cooperative. Normal mood and affect.  Impression/Plan: [name] is now here to undergo a screening colonoscopy.  First ever colonoscopy. No bowel symptoms currently. Positive family history of colon polyps in patient's mother.  Risks, benefits,  limitations, imponderables and alternatives regarding colonoscopy have been reviewed with the patient. Questions have been answered. All parties agreeable.

## 2011-09-04 ENCOUNTER — Encounter: Payer: Self-pay | Admitting: Internal Medicine

## 2011-09-05 ENCOUNTER — Encounter (HOSPITAL_COMMUNITY): Payer: Self-pay | Admitting: Internal Medicine

## 2011-11-06 ENCOUNTER — Ambulatory Visit (INDEPENDENT_AMBULATORY_CARE_PROVIDER_SITE_OTHER): Payer: 59

## 2011-11-06 ENCOUNTER — Ambulatory Visit (INDEPENDENT_AMBULATORY_CARE_PROVIDER_SITE_OTHER): Payer: 59 | Admitting: Orthopedic Surgery

## 2011-11-06 ENCOUNTER — Encounter: Payer: Self-pay | Admitting: Orthopedic Surgery

## 2011-11-06 VITALS — BP 108/60 | Ht 67.0 in | Wt 158.0 lb

## 2011-11-06 DIAGNOSIS — M79609 Pain in unspecified limb: Secondary | ICD-10-CM

## 2011-11-06 DIAGNOSIS — M5137 Other intervertebral disc degeneration, lumbosacral region: Secondary | ICD-10-CM

## 2011-11-06 DIAGNOSIS — M51379 Other intervertebral disc degeneration, lumbosacral region without mention of lumbar back pain or lower extremity pain: Secondary | ICD-10-CM

## 2011-11-06 DIAGNOSIS — M5136 Other intervertebral disc degeneration, lumbar region: Secondary | ICD-10-CM | POA: Insufficient documentation

## 2011-11-06 DIAGNOSIS — M79604 Pain in right leg: Secondary | ICD-10-CM

## 2011-11-06 DIAGNOSIS — M79605 Pain in left leg: Secondary | ICD-10-CM

## 2011-11-06 DIAGNOSIS — M549 Dorsalgia, unspecified: Secondary | ICD-10-CM

## 2011-11-06 MED ORDER — DICLOFENAC SODIUM 50 MG PO TBEC: 50.0000 mg | DELAYED_RELEASE_TABLET | Freq: Two times a day (BID) | ORAL | Status: AC

## 2011-11-06 NOTE — Patient Instructions (Signed)
Medication has been sent to your pharmacy Start physical therapy

## 2011-11-06 NOTE — Progress Notes (Signed)
  Subjective:  A consult has been requested by Dr. Sinda Du    [name] is a 56 y.o. male who presents for evaluation of the legs buckling at the knees. The patient's symptoms started over 6 months ago and they have been getting worse over the last month. The patient has not received any treatment and he has not had any injury. He works as a Chartered loss adjuster person in [employer] and he's had no injury. He reports pain like sensation 6/10 intermittent pain in his left leg primarily with inadvertent giving out of both knees and legs with the feeling that the knees are giving way. It seems to be worse when he is walking.  . The patient has no "red flag" history indicative of complicated back pain.  The following portions of the patient's history were reviewed and updated as appropriate: allergies, current medications, past family history, past medical history, past social history, past surgical history and problem list.  Weight gain and I pain or complaints as well as watering of the eyes with respiratory issues of shortness of breath, wheezing and cough. He has some heartburn urgency easy bruising excessive thirst excessive urination and heat and cold intolerance   Review of Systems Pertinent items are noted in HPI.    Objective:  BP 108/60  Ht 5' 7"  (1.702 m)  Wt 158 lb (71.668 kg)  BMI 24.75 kg/m2  Vital signs are stable as recorded  General appearance is normal  The patient is alert and oriented x3  The patient's mood and affect are normal  Gait assessment: Normal The cardiovascular exam reveals normal pulses and temperature without edema swelling.  The lymphatic system is negative for palpable lymph nodes  The sensory exam is normal.  There are no pathologic reflexes.  Balance is normal.   Exam of the lumbar spine and lower extremities  There is tenderness in the left gluteal region and left SI joint as well as the L5-S1 segment. The patient  has significant tightness of his hamstrings with popliteal angles of 60.  Both knees are evaluated there is no joint effusion there is full range of motion normal strength and stability with normal reflexes and normal pinprick sensation distally  Straight leg raise is revealed tightness of the lower extremities Skin is normal in both legs and feet  X-ray shows degenerative disc disease primarily in the facet joints with what is most likely reactive scoliosis  Impression degenerative disc disease, bilateral leg pain, bilateral leg weakness.  Recommend physical therapy and anti-inflammatories followup in 6 weeks if no improvement recommend MRI.  These are usually signs of early spinal stenosis.

## 2011-11-26 ENCOUNTER — Ambulatory Visit (HOSPITAL_COMMUNITY)
Admission: RE | Admit: 2011-11-26 | Discharge: 2011-11-26 | Disposition: A | Discharge status: Home / Self Care | Payer: 59 | Source: Ambulatory Visit | Attending: Orthopedic Surgery | Admitting: Orthopedic Surgery

## 2011-11-26 DIAGNOSIS — M6281 Muscle weakness (generalized): Secondary | ICD-10-CM | POA: Insufficient documentation

## 2011-11-26 DIAGNOSIS — M545 Low back pain, unspecified: Secondary | ICD-10-CM | POA: Insufficient documentation

## 2011-11-26 DIAGNOSIS — IMO0001 Reserved for inherently not codable concepts without codable children: Secondary | ICD-10-CM | POA: Insufficient documentation

## 2011-11-26 NOTE — Evaluation (Signed)
Physical Therapy Evaluation  Patient Details  Name: [name] MRN: [id number] Date of Birth: [birth year]  Today's Date: 11/26/2011 Time: 1700-1754 PT Time Calculation (min): 54 min Charges: 1 eval Visit#: 1  of 8   Re-eval: 12/14/11 Assessment Diagnosis: LBP Next MD Visit: Dr. Aline [name] - 12/19/11  Past Medical History:  Past Medical History  Diagnosis Date  . PONV (postoperative nausea and vomiting)   . Asthma   . Dysrhythmia   . Diabetes mellitus   . GERD (gastroesophageal reflux disease)    Past Surgical History:  Past Surgical History  Procedure Date  . Hernia repair     2010, september  . Vasectomy 1989  . Colonoscopy 08/31/2011    Procedure: COLONOSCOPY;  Surgeon: Christopher Dolin, MD;  Location: [facility];  Service: Endoscopy;  Laterality: N/A;  8: 15 Am    Subjective Symptoms/Limitations Symptoms: Pt is referred to Pt secondary to LBP.  his c/co is his L leg buckling ( more than his R leg) about 25x a day.  he reports that he has fallen in the past (which has required him to buy a cane).  he reports furniture walking.   he walks with a stick during his work and states it feels better when he walks on the grass compared to the cement.  Signficant PMH: he has had signficant pnemonias and respiratory infections How long can you sit comfortably?: Sitting for greater than 1 hour his leg will buckle. How long can you stand comfortably?: No difficulty with standing (except that he favors his LLE) How long can you walk comfortably?: 45 minutes and then his leg starts to buckle on him.  Pain Assessment Currently in Pain?: No/denies Pain Score:  (Pain range up to 5/10) Pain Location: Back Pain Orientation: Mid;Lower Pain Type: Chronic pain Pain Radiating Towards: intermittent pain to his back.   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  Enjoys attending car races with children in need.  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 Other Assessments: impaired LE flexibilty to hip flexors and hamstrings.  Increased dizziness with sit to stand  Sensation/Coordination/Flexibility/Functional Tests Sensation Light Touch: Appears Intact  Assessment RLE Strength Right Hip Flexion: 4/5 Right Hip Extension: 3+/5 Right Hip ABduction: 5/5 Right Hip ADduction: 5/5 Right Knee Flexion: 5/5 Right Knee Extension: 5/5 LLE Strength Left Hip Flexion: 4/5 Left Hip Extension: 3+/5 Left Hip ABduction: 5/5 Left Hip ADduction: 5/5 Left Knee Flexion: 5/5 Left Knee Extension: 5/5 Lumbar Strength Overall Lumbar Strength Comments: TrA: 3/10, PF: 2/10 Palpation Palpation: increased muscle spasm to low back and B psoas region with decreased mobility to L3-5 SP and B TP's.  Decreased tone to R gluteal region.  Pain and tenderness to L quadratus region and anterior hip flexor region (over site of inguinal hernia repair).  Exercise/Treatments Stretches Active Hamstring Stretch: 1 rep;30 seconds (BLE) Single Knee to Chest Stretch: 1 rep;30 seconds (BLE) Piriformis Stretch: 1 rep;30 seconds (BLE) Supine Bridge: 5 reps  Manual Therapy Manual Therapy: Joint mobilization Joint Mobilization: Grade II to L3-5 SP and TP's to decrease pain and improve psoas muscle length.   Physical Therapy Assessment and Plan PT Assessment and Plan Clinical Impression Statement: [name] is a 56 year old male referred to PT secondary to LBP w/radiculopathy  and c/co of LE instability with ambulation.  I believe his LE instability is greatly contributed to his decreased flexibility to his iliopsoas muscle  and decreased hip and core strength.   Had significant improvement in psoas length after  joint mobilizations to lumbar spine with decreased pain.  Pt will benefit from skilled therapeutic intervention in order to improve on the following deficits: Pain;Decreased coordination;Increased muscle spasms;Increased fascial restricitons;Impaired flexibility;Impaired tone;Decreased strength Rehab Potential: Good PT Frequency: Min 2X/week PT Duration: 4 weeks PT Treatment/Interventions: Gait training;Stair training;Functional mobility training;Therapeutic activities;Therapeutic exercise;Neuromuscular re-education;Patient/family education;Other (comment) (manual and modalities for pain control ) PT Plan: LE flexibility training (especially to psoas muscle), core stabalization training and re-education.    Goals Home Exercise Program Pt will Perform Home Exercise Program: Independently PT Goal: Perform Home Exercise Program - Progress: Goal set today PT Short Term Goals Time to Complete Short Term Goals: 2 weeks PT Short Term Goal 1: Pt will report pain less than 3/10 for 50% of his day.  PT Short Term Goal 2: Pt will improve core and LE strength by 1.  PT Short Term Goal 3: Pt demonstrate improved core endurance and demonstrate 10x10 sec holds to TrA, multifidus and PF musculature.  PT Short Term Goal 4: Pt will improve hamstring and hip flexor flexibility.  PT Short Term Goal 5: Pt will improve his LE and core motor control to attend his daughters wedding and dance without increased pain or instability.  PT Long Term Goals Time to Complete Long Term Goals: 4 weeks PT Long Term Goal 1: Pt will report a 75% decrease in leg buckling for improved QOL.  PT Long Term Goal 2: Pt will improve his core motor control in order to tolerate ambulating for greater than 1 hour with reports of less than 2 knee buckling incidence  Problem List Patient Active Problem List  Diagnoses  . Bilateral leg pain  . Back pain with radiation  . DDD (degenerative disc disease), lumbar  . Lumbago  . Muscle  weakness (generalized)    PT - End of Session Activity Tolerance: Patient tolerated treatment well General Behavior During Session: [facility] for tasks performed Cognition: [facility] for tasks performed PT Plan of Care PT Home Exercise Plan: see scanned document PT Patient Instructions: importance of core muscles and proper muscle length to improve stability.   Consulted and Agree with Plan of Care: Patient  Akemi Overholser 11/26/2011, 6:14 PM  Physician Documentation Your signature is required to indicate approval of the treatment plan as stated above.  Please sign and either send electronically or make a copy of this report for your files and return this physician signed original.   Please mark one 1.__approve of plan  2. ___approve of plan with the following conditions.   ______________________________                                                          _____________________ Physician Signature  Date

## 2011-11-29 ENCOUNTER — Inpatient Hospital Stay (HOSPITAL_COMMUNITY): Admission: RE | Admit: 2011-11-29 | Discharge: 2011-11-29 | Payer: 59 | Source: Ambulatory Visit

## 2011-11-29 NOTE — Progress Notes (Signed)
Physical Therapy Treatment Patient Details  Name: TIJUAN DANTES MRN: [id number] Date of Birth: [birth year]  Today's Date: 11/29/2011 Time: 8333-8329 PT Time Calculation (min): 55 min  Visit#: 2  of 8   Re-eval: 12/14/11  Charge: gait 12 min therex 35 min Manual 8 min   Subjective: Symptoms/Limitations Symptoms: Pt reported compliance with stretches at home with no questions.  Pt stated no pain just soreness today.  Began session with gait training, L knee buckled 3 times during gait training. Pain Assessment Currently in Pain?: No/denies  Objective:   Exercise/Treatments Stretches Active Hamstring Stretch: 3 reps;30 seconds Single Knee to Chest Stretch: 3 reps;30 seconds Hip Flexor Stretch: 3 reps;30 seconds Piriformis Stretch: 2 reps;30 seconds Aerobic Tread Mill: 5 min @ 1.5  Standing Other Standing Lumbar Exercises: Gait training x 8 min for gait mechanics and posture. Supine Ab Set: Limitations;10 reps AB Set Limitations: PFC 10x 10" Bent Knee Raise: 5 reps;5 seconds;Limitations Bent Knee Raise Limitations: with ab set Bridge: 10 reps  Manual Therapy Manual Therapy: Massage Massage: STM to L3-S3 to decrease tightness/pain.  Physical Therapy Assessment and Plan PT Assessment and Plan Clinical Impression Statement: Began tx per PT Plan with focus on proper gait mechanics, core stabilization and LE flexibility.  Pt with core and LE instability noted , L knee buckled 3 times during gait training requiring mod assistance to regain balance.  Pt able to complete all new exercises correctly with min verbal/tactile cueing for appropriate mm activation. PT Plan: Continue with LE flexibility, core stabilization and NMR.    Goals    Problem List Patient Active Problem List  Diagnoses  . Bilateral leg pain  . Back pain with radiation  . DDD (degenerative disc disease), lumbar  . Lumbago  . Muscle weakness (generalized)    PT - End of Session Activity Tolerance:  Patient tolerated treatment well General Behavior During Session: [facility] for tasks performed Cognition: [facility] for tasks performed  GP No functional reporting required  Aldona Lento, PTA 11/29/2011, 6:40 PM

## 2011-12-05 ENCOUNTER — Ambulatory Visit (HOSPITAL_COMMUNITY)
Admission: RE | Admit: 2011-12-05 | Discharge: 2011-12-05 | Disposition: A | Discharge status: Home / Self Care | Payer: 59 | Source: Ambulatory Visit | Attending: Pulmonary Disease | Admitting: Pulmonary Disease

## 2011-12-05 NOTE — Progress Notes (Signed)
Physical Therapy Treatment Patient Details  Name: [name] MRN: [id number] Date of Birth: 1956-02-25  Today's Date: 12/05/2011 Time: 3825-0539 PT Time Calculation (min): 43 min  Visit#: 3  of 8   Re-eval: 12/14/11  Charge: therex 30 min Manual 12 min  Subjective: Symptoms/Limitations Symptoms: Pt reported no pain just soreness today.  Knee buckling continues, today was a good day though  Objective:   Exercise/Treatments Aerobic Tread Mill: 8 min @ 1.5 Sidelying Other Sidelying Lumbar Exercises: Sidelying Psoas strengthening with trunk rotation 10x bilateral  Manual Therapy Manual Therapy: Joint mobilization Joint Mobilization: Grade II PA to TP L4- L5 complete by Geoffery Lyons, PT  Physical Therapy Assessment and Plan PT Assessment and Plan Clinical Impression Statement: Session focus on improving flexibilty, core strengthening and motor control.  Began s/l therex for psoas, quadratus lumborum and multifidi strengthening with cueing for technique required.  Geoffery Lyons, PT completed spinal mobs to improve motor control for L psoas.  Pt with increased flexibility and better gait mechanics noted following treatment.   PT Plan: Continue with LE flexibility training (especially to psoas muscle), core stabalization training and re-education    Goals    Problem List Patient Active Problem List  Diagnoses  . Bilateral leg pain  . Back pain with radiation  . DDD (degenerative disc disease), lumbar  . Lumbago  . Muscle weakness (generalized)    PT - End of Session Activity Tolerance: Patient tolerated treatment well General Behavior During Session: [facility] for tasks performed Cognition: [facility] for tasks performed  GP No functional reporting required  Aldona Lento, PTA 12/05/2011, 5:23 PM

## 2011-12-07 ENCOUNTER — Inpatient Hospital Stay (HOSPITAL_COMMUNITY): Admission: RE | Admit: 2011-12-07 | Payer: 59 | Source: Ambulatory Visit

## 2011-12-07 ENCOUNTER — Telehealth (HOSPITAL_COMMUNITY): Payer: Self-pay

## 2011-12-11 ENCOUNTER — Ambulatory Visit (HOSPITAL_COMMUNITY)
Admission: RE | Admit: 2011-12-11 | Discharge: 2011-12-11 | Disposition: A | Discharge status: Home / Self Care | Payer: 59 | Source: Ambulatory Visit | Attending: Orthopedic Surgery | Admitting: Orthopedic Surgery

## 2011-12-11 DIAGNOSIS — IMO0001 Reserved for inherently not codable concepts without codable children: Secondary | ICD-10-CM | POA: Insufficient documentation

## 2011-12-11 DIAGNOSIS — M6281 Muscle weakness (generalized): Secondary | ICD-10-CM | POA: Insufficient documentation

## 2011-12-11 DIAGNOSIS — M545 Low back pain, unspecified: Secondary | ICD-10-CM | POA: Insufficient documentation

## 2011-12-11 NOTE — Progress Notes (Signed)
Physical Therapy Treatment Patient Details  Name: SHYLOH KRINKE MRN: [id number] Date of Birth: Aug 12, 1955  Today's Date: 12/11/2011 Time: 1640-1740 PT Time Calculation (min): 60 min  Visit#: 4  of 8   Re-eval: 12/14/11  Charge: therex 45 min Manual 8 min   Subjective: Symptoms/Limitations Symptoms: Pt stated he was pain free today just felt very tired from work earlier today and weak.  Had daughter's wedding this past weekend, pt tired from that event. Pain Assessment Currently in Pain?: No/denies  Objective:   Exercise/Treatments Stretches Active Hamstring Stretch: 3 reps;30 seconds Hip Flexor Stretch: 2 reps;30 seconds;Limitations Hip Flexor Stretch Limitations: psoas st with foot on mat 30" with forward hip st, 30" with opposite side rotation for iliopsoas Piriformis Stretch: 3 reps;30 seconds Aerobic Tread Mill: 8 min @ 1.8 Standing Functional Squats: 10 reps;Limitations Functional Squats Limitations: lifting mechanics lifting soccer ball from 8 in step Seated Sit to Stand: 10 reps;Limitations Sit to Stand Limitations: no HHA, cueing for slow eccentric control when descending Supine Ab Set: Limitations;10 reps AB Set Limitations: PFC 10x 10"; TrA 10x 10" tactile cueing for correct mm Bent Knee Raise: 10 reps;5 seconds Bridge: 10 reps Straight Leg Raise: 5 reps Isometric Hip Flexion: 5 reps;Limitations Isometric Hip Flexion Limitations: 5x 10" holds B LE  Prone  Straight Leg Raise: 10 reps;5 seconds  Manual Therapy Massage: STM to L3-S3 to decrease tightness/pain  Physical Therapy Assessment and Plan PT Assessment and Plan Clinical Impression Statement: Added therex exercises for psoas and gluteal strengthening, pt able to follow instructions and completed all exercises correctly with no cueing required but noted increased fatigue with reps and increased activities. PT Plan: Continue with current POC for LE strengthening and flexibility and core stabilization  training/re-education.      Goals    Problem List Patient Active Problem List  Diagnoses  . Bilateral leg pain  . Back pain with radiation  . DDD (degenerative disc disease), lumbar  . Lumbago  . Muscle weakness (generalized)    PT - End of Session Activity Tolerance: Patient tolerated treatment well;Patient limited by fatigue General Behavior During Session: [facility] for tasks performed Cognition: [facility] for tasks performed  GP No functional reporting required  Aldona Lento, PTA 12/11/2011, 5:44 PM

## 2011-12-14 ENCOUNTER — Ambulatory Visit (HOSPITAL_COMMUNITY)
Admission: RE | Admit: 2011-12-14 | Discharge: 2011-12-14 | Disposition: A | Discharge status: Home / Self Care | Payer: 59 | Source: Ambulatory Visit | Attending: Pulmonary Disease | Admitting: Pulmonary Disease

## 2011-12-14 NOTE — Progress Notes (Signed)
Physical Therapy RE-evaluation Treatment Patient Details  Name: [name] MRN: [id number] Date of Birth: [birth year]  Today's Date: 12/14/2011 Time: 6063-0160 PT Time Calculation (min): 43 min Charges: Gait x12, TE x 26, 1 MMT Visit#: 5  of 8   Re-eval: 01/13/12    Subjective: Symptoms/Limitations Symptoms: Pt reports that he feels that his leg may be buckling less comapred to before.  Pain Assessment Currently in Pain?: No/denies   12/14/11 1300  Observation/Other Assessments  Observations 2 knee bucklings in 12 minutes of exercise  RLE Strength  Right Hip Flexion 5/5 (was 4/5)  Right Hip Extension 5/5 (was 5/5)  RLE Overall Strength Comments Hip IR: 4/5  LLE Strength  Left Hip Flexion 5/5 (was 4/5)  Left Hip Extension 5/5 (was 3+/5)  LLE Overall Strength Comments Hip IR: 3/5 (prone w/knee bent)  Lumbar Strength  Overall Lumbar Strength Comments TrA: 6/10; PF: 7/10  Palpation  Palpation continues to have increased muscular tone without pain.      Exercise/Treatments Aerobic 12 min throughout hospital w/Min A and 2 buckling w/assistance.  max cueing for heel placement.  encouraged to be self aware of body position Standing Functional Squats: 20 reps Other Standing Lumbar Exercises: Hip abduction w/foot IR 2x10 Other Standing Lumbar Exercises: L Heel Roll in and out 20x; vector stance BLE 3x 5" with 1 finger HHA Seated Other Seated Lumbar Exercises: heel/toe roll in/out 10x 10" Supine Bridge: 15 reps;5 seconds (w/isometric ball squeeze) Other Supine Lumbar Exercises: windshield wipers B LE (hip IR/ER) 10x5"    Physical Therapy Assessment and Plan PT Assessment and Plan Clinical Impression Statement: [name] has attended 5 OP PT visists and has made progress with his LE strength, however continues to demonstrate significant difficulty with controlling his LE mobility with continued difficulty with neuromuscular control of his LE.  He demonstrate decreased L  heel strike and weakness to L hip IR which may be causing increased instability.  He is making progress towards his overall goals.  He also demonstrates mild dyspnea with moderate pace ambulation throughout the hospital and has instability episodes when he is fatigued.  Pt will benefit from skilled therapeutic intervention in order to improve on the following deficits: Abnormal gait;Decreased activity tolerance;Decreased strength PT Frequency: Min 2X/week PT Duration: 4 weeks PT Treatment/Interventions: Gait training;Stair training;Functional mobility training;Therapeutic activities;Therapeutic exercise;Balance training;Neuromuscular re-education;Patient/family education PT Plan: Continue with hip strengthening, add forward lunges, hip hiking, core endurance training, bridging w/SLR, heel walking with appropriate posture.     Goals Home Exercise Program Pt will Perform Home Exercise Program: Independently PT Goal: Perform Home Exercise Program - Progress: Met PT Short Term Goals Time to Complete Short Term Goals: 2 weeks PT Short Term Goal 1: Pt will report pain less than 3/10 for 50% of his day.  PT Short Term Goal 1 - Progress: Met PT Short Term Goal 2: Pt will improve core and LE strength by 1.  PT Short Term Goal 2 - Progress: Partly met PT Short Term Goal 3: Pt demonstrate improved core endurance and demonstrate 10x10 sec holds to TrA, multifidus and PF musculature.  PT Short Term Goal 3 - Progress: Progressing toward goal PT Short Term Goal 4: Pt will improve hamstring and hip flexor flexibility.  PT Short Term Goal 4 - Progress: Met PT Short Term Goal 5: Pt will improve his LE and core motor control to attend his daughters wedding and dance without increased pain or instability.  PT Short Term Goal 5 - Progress:  Met (had increased swelling in the evening. ) PT Long Term Goals Time to Complete Long Term Goals: 4 weeks PT Long Term Goal 1: Pt will report a 75% decrease in leg buckling  for improved QOL.  PT Long Term Goal 1 - Progress: Not met (20% decrease) PT Long Term Goal 2: Pt will improve his core motor control in order to tolerate ambulating for greater than 1 hour with reports of less than 2 knee buckling incidence (5x ) PT Long Term Goal 2 - Progress: Not met  Problem List Patient Active Problem List  Diagnoses  . Bilateral leg pain  . Back pain with radiation  . DDD (degenerative disc disease), lumbar  . Lumbago  . Muscle weakness (generalized)    PT - End of Session Activity Tolerance: Patient tolerated treatment well;Patient limited by fatigue General Behavior During Session: [facility] for tasks performed Cognition: [facility] for tasks performed  GP No functional reporting required  [name] 12/14/2011, 3:39 PM

## 2011-12-18 ENCOUNTER — Ambulatory Visit (HOSPITAL_COMMUNITY)
Admission: RE | Admit: 2011-12-18 | Discharge: 2011-12-18 | Disposition: A | Discharge status: Home / Self Care | Payer: 59 | Source: Ambulatory Visit | Attending: Pulmonary Disease | Admitting: Pulmonary Disease

## 2011-12-18 NOTE — Progress Notes (Signed)
Physical Therapy Treatment Patient Details  Name: [name] MRN: [id number] Date of Birth: December 21, 1955  Today's Date: 12/18/2011 Time: 0856-9437 PT Time Calculation (min): 41 min  Visit#: 6  of 8   Re-eval: 01/13/12  Charge: gait 12 min therex 28 min  Subjective: Symptoms/Limitations Symptoms: Pt reported he does not think his leg has buckled today.  Pt tired today following work, no pain. Pain Assessment Currently in Pain?: No/denies  Objective:   Exercise/Treatments Aerobic Tread Mill: 12 min  outdoor w/Min A and 2 buckling w/assistance.  max cueing for heel placement.  encouraged to be self aware of body position Standing Heel Raises: Limitations Heel Raises Limitations: 2RT heel/toe walking Functional Squats: 20 reps;3 seconds Forward Lunge: 10 reps Other Standing Lumbar Exercises: Hip abduction w/foot IR 2x10 Other Standing Lumbar Exercises: L Heel Roll in and out 20x; vector stance BLE 5x 5" with 1 finger HHA Supine Bridge: 5 reps;Limitations Bridge Limitations: B with SLR Other Supine Lumbar Exercises: windshield wipers B LE (hip IR/ER) 10x5"  Physical Therapy Assessment and Plan PT Assessment and Plan Clinical Impression Statement: Began exercises per PT re-eval complete last session.  Progressed gait to outdoor environment assess pt.'s safety with dynamic surfaces.  Pt with mod-max cueing for proper heel to toe gait.  Pt knee buckled 2x during gait, pt able to regain balance with no assistance required, knee buckling increases with fatigue.  Pt able to complete new hip strengthening exercises wtih good form following demonstration but noted hip mm fatigue with increase activity. PT Plan: Continue with hip strengthening.    Goals    Problem List Patient Active Problem List  Diagnoses  . Bilateral leg pain  . Back pain with radiation  . DDD (degenerative disc disease), lumbar  . Lumbago  . Muscle weakness (generalized)    PT - End of Session Activity  Tolerance: Patient tolerated treatment well;Patient limited by fatigue General Behavior During Session: [facility] for tasks performed Cognition: [facility] for tasks performed  GP No functional reporting required  Aldona Lento, PTA 12/18/2011, 6:16 PM

## 2011-12-19 ENCOUNTER — Encounter: Payer: Self-pay | Admitting: Orthopedic Surgery

## 2011-12-19 ENCOUNTER — Ambulatory Visit (INDEPENDENT_AMBULATORY_CARE_PROVIDER_SITE_OTHER): Payer: 59 | Admitting: Orthopedic Surgery

## 2011-12-19 VITALS — BP 102/68 | Ht 67.0 in | Wt 158.0 lb

## 2011-12-19 DIAGNOSIS — R29898 Other symptoms and signs involving the musculoskeletal system: Secondary | ICD-10-CM

## 2011-12-19 NOTE — Progress Notes (Signed)
Patient ID: [name], male   DOB: [birth year], 56 y.o.   MRN: [id number] Chief Complaint  Patient presents with  . Follow-up    6 week follow up from PT   Review of systems denies any red black such as bowel or bladder dysfunction loss of urine control   Followup after physical therapy for proximal leg weakness and giving out symptoms of the lower extremities thought to be secondary to spinal stenosis  The patient is improved denies back pain has occasional leg pain  Has some mild evidence of tension signs on the right leg exacerbated by Lasegue's maneuver negative on the left leg. Ligaments remain stable in both knees.  Impression spinal stenosis for/proximal leg weakness  Recommend continued physical therapy

## 2011-12-19 NOTE — Patient Instructions (Signed)
Continue physical therapy

## 2011-12-21 ENCOUNTER — Ambulatory Visit (HOSPITAL_COMMUNITY): Payer: 59 | Admitting: Physical Therapy

## 2011-12-26 ENCOUNTER — Ambulatory Visit (HOSPITAL_COMMUNITY)
Admission: RE | Admit: 2011-12-26 | Discharge: 2011-12-26 | Disposition: A | Discharge status: Home / Self Care | Payer: 59 | Source: Ambulatory Visit | Attending: Pulmonary Disease | Admitting: Pulmonary Disease

## 2011-12-26 NOTE — Progress Notes (Signed)
Physical Therapy Treatment Patient Details  Name: [name] MRN: [id number] Date of Birth: Apr 20, 1956  Today's Date: 12/26/2011 Time: 7583-0746 PT Time Calculation (min): 50 min  Visit#: 7  of 16   Re-eval: 01/13/12  Charge: therex 40 min Gait 10 min  Subjective: Symptoms/Limitations Symptoms: Pt stated MD happy with progress, reported no knee buckling today but did have one episode yesterday.   Pain Assessment Currently in Pain?: No/denies  Objective:   Exercise/Treatments Aerobic Tread Mill: 10 min @ 2.0 w/ appropriate gait mechanics Standing Heel Raises: Limitations Heel Raises Limitations: 2RT heel/toe walking Forward Lunge: 10 reps Wall Slides: 10 reps;Limitations Wall Slides Limitations: 10 sec Other Standing Lumbar Exercises: Hip abduction w/foot IR 2x10 Other Standing Lumbar Exercises: L Heel Roll in and out 20x; vector stance BLE 5x 5" with hip IR with 1 finger HHA Supine Bridge: 10 reps;Limitations Bridge Limitations: B with SLR Other Supine Lumbar Exercises: windshield wipers B LE (hip IR/ER) 20x5" Sidelying Clam: 10 reps;Limitations Clam Limitations: 10 sec holds B LE  Physical Therapy Assessment and Plan PT Assessment and Plan Clinical Impression Statement: Progressed to wall squats for LE strengthening with noted visible fatigue with new exercise as well as with forward lunges.  Pt able to complete all exercises with good form following min cueing for form/technique.   PT Plan: Continue with current POC.    Goals    Problem List Patient Active Problem List  Diagnosis  . Bilateral leg pain  . Back pain with radiation  . DDD (degenerative disc disease), lumbar  . Lumbago  . Muscle weakness (generalized)    PT - End of Session Activity Tolerance: Patient tolerated treatment well;Patient limited by fatigue General Behavior During Session: [facility] for tasks performed Cognition: [facility] for tasks performed  GP No functional reporting  required  Aldona Lento, PTA 12/26/2011, 5:02 PM

## 2011-12-28 ENCOUNTER — Ambulatory Visit (HOSPITAL_COMMUNITY): Payer: 59 | Admitting: Physical Therapy

## 2012-01-02 ENCOUNTER — Ambulatory Visit (HOSPITAL_COMMUNITY)
Admission: RE | Admit: 2012-01-02 | Discharge: 2012-01-02 | Disposition: A | Discharge status: Home / Self Care | Payer: 59 | Source: Ambulatory Visit | Attending: Pulmonary Disease | Admitting: Pulmonary Disease

## 2012-01-02 NOTE — Evaluation (Addendum)
Physical Therapy Discharge and Treatment  Patient Details  Name: AQUILES RUFFINI MRN: [id number] Date of Birth: [birth year]  Today's Date: 01/02/2012 Time: 1620-1658 PT Time Calculation (min): 38 min Charges: 1 MMT, 30' TE, 5' Self Care Visit#: 8  of 16   Re-eval: 01/13/12  Past Medical History:  Past Medical History  Diagnosis Date  . PONV (postoperative nausea and vomiting)   . Asthma   . Dysrhythmia   . Diabetes mellitus   . GERD (gastroesophageal reflux disease)    Past Surgical History:  Past Surgical History  Procedure Date  . Hernia repair     2010, september  . Vasectomy 1989  . Colonoscopy 08/31/2011    Procedure: COLONOSCOPY;  Surgeon: [name], MD;  Location: [facility];  Service: Endoscopy;  Laterality: N/A;  8: 15 Am    Subjective Symptoms/Limitations Symptoms: Pt reprots that he is doing much better.  He has not had any knee buckling today and possibly only 1x yesterday.  Reports that he has been working out more and walking on his daughters treadmill.  Pain Assessment Currently in Pain?: No/denies  Assessment RLE Strength RLE Overall Strength Comments: Hip IR 5/5 (was 4/5) Hip ER: 5/5 Right Hip Flexion: 5/5 Right Hip Extension: 5/5 LLE Strength LLE Overall Strength Comments: Hip IR 5/5 (was 3/5), Hip ER: 5/5 Left Hip Flexion: 5/5 Left Hip Extension: 5/5 Lumbar Strength Overall Lumbar Strength Comments: TrA: 8/10; PF: 10/10  Exercise/Treatments Aerobic Tread Mill: 10 min @2 .3 mph @ 3.0 incline bilateral HHA Standing Other Standing Lumbar Exercises: Hip abduction w/foot IR 2x10 Other Standing Lumbar Exercises: L Heel Roll in and out 20x; vector stance BLE 5x 5" with hip IR with 1 finger HHA Seated Other Seated Lumbar Exercises: heel/toe roll in/out 10x 10" Supine Bent Knee Raise: 10 reps;Other (comment) (w/ab set) Bridge: 10 reps Bridge Limitations: B with SLR Sidelying Clam: 10 reps Clam Limitations: 10 sec holds B LE Hip Abduction:  10 reps;Other (comment) (w/toe IR)  Physical Therapy Assessment and Plan PT Assessment and Plan Clinical Impression Statement: Mr. Coopman has attended 8 OP PT vists to address LBP/LE weakness related to back pain w/ following findings: all goals met, improved hip and core strength/coordination, independent with HEP, and reports of significant decreased LOB and knee buckling incidents.  He is to be D/C from PT w/advanced HEP.  PT Plan: D/C w/HEp    Goals Home Exercise Program Pt will Perform Home Exercise Program: Independently PT Goal: Perform Home Exercise Program - Progress: Met PT Short Term Goals Time to Complete Short Term Goals: 2 weeks PT Short Term Goal 1: Pt will report pain less than 3/10 for 50% of his day.  PT Short Term Goal 1 - Progress: Met PT Short Term Goal 2: Pt will improve core and LE strength by 1.  PT Short Term Goal 2 - Progress: Met PT Short Term Goal 3: Pt demonstrate improved core endurance and demonstrate 10x10 sec holds to TrA, multifidus and PF musculature.  PT Short Term Goal 3 - Progress: Met PT Short Term Goal 4: Pt will improve hamstring and hip flexor flexibility.  PT Short Term Goal 4 - Progress: Met PT Short Term Goal 5: Pt will improve his LE and core motor control to attend his daughters wedding and dance without increased pain or instability.  PT Short Term Goal 5 - Progress: Met PT Long Term Goals Time to Complete Long Term Goals: 4 weeks PT Long Term Goal 1: Pt will  report a 75% decrease in leg buckling for improved QOL.  PT Long Term Goal 1 - Progress: Met PT Long Term Goal 2: Pt will improve his core motor control in order to tolerate ambulating for greater than 1 hour with reports of less than 2 knee buckling incidence (5x ) PT Long Term Goal 2 - Progress: Met  Problem List Patient Active Problem List  Diagnosis  . Bilateral leg pain  . Back pain with radiation  . DDD (degenerative disc disease), lumbar  . Lumbago  . Muscle weakness  (generalized)    PT - End of Session Activity Tolerance: Patient tolerated treatment well;Patient limited by fatigue General Behavior During Session: [facility] for tasks performed Cognition: [facility] for tasks performed PT Plan of Care PT Home Exercise Plan: see updated HEP PT Patient Instructions: continue with HEP Consulted and Agree with Plan of Care: Patient  Jiovanni Heeter 01/02/2012, 5:16 PM

## 2012-01-04 ENCOUNTER — Ambulatory Visit (HOSPITAL_COMMUNITY): Payer: 59 | Admitting: Physical Therapy

## 2012-01-09 ENCOUNTER — Ambulatory Visit (HOSPITAL_COMMUNITY): Payer: 59 | Admitting: Physical Therapy

## 2012-01-11 ENCOUNTER — Ambulatory Visit (HOSPITAL_COMMUNITY): Payer: 59 | Admitting: Physical Therapy

## 2012-01-16 ENCOUNTER — Ambulatory Visit (HOSPITAL_COMMUNITY): Payer: 59 | Admitting: Physical Therapy

## 2012-01-18 ENCOUNTER — Ambulatory Visit (HOSPITAL_COMMUNITY): Payer: 59 | Admitting: Physical Therapy

## 2012-03-26 ENCOUNTER — Ambulatory Visit: Payer: 59 | Admitting: Orthopedic Surgery

## 2012-03-26 ENCOUNTER — Encounter: Payer: Self-pay | Admitting: Orthopedic Surgery

## 2012-04-10 ENCOUNTER — Other Ambulatory Visit: Payer: Self-pay | Admitting: Dermatology

## 2012-05-13 ENCOUNTER — Emergency Department (HOSPITAL_COMMUNITY)
Admission: EM | Admit: 2012-05-13 | Discharge: 2012-05-13 | Disposition: A | Discharge status: Home / Self Care | Payer: 59 | Attending: Emergency Medicine | Admitting: Emergency Medicine

## 2012-05-13 ENCOUNTER — Encounter (HOSPITAL_COMMUNITY): Payer: Self-pay | Admitting: Emergency Medicine

## 2012-05-13 ENCOUNTER — Emergency Department (HOSPITAL_COMMUNITY): Payer: 59

## 2012-05-13 DIAGNOSIS — R296 Repeated falls: Secondary | ICD-10-CM | POA: Insufficient documentation

## 2012-05-13 DIAGNOSIS — S20219A Contusion of unspecified front wall of thorax, initial encounter: Secondary | ICD-10-CM | POA: Insufficient documentation

## 2012-05-13 DIAGNOSIS — Z87891 Personal history of nicotine dependence: Secondary | ICD-10-CM | POA: Insufficient documentation

## 2012-05-13 DIAGNOSIS — Y9372 Activity, wrestling: Secondary | ICD-10-CM | POA: Insufficient documentation

## 2012-05-13 DIAGNOSIS — K219 Gastro-esophageal reflux disease without esophagitis: Secondary | ICD-10-CM | POA: Insufficient documentation

## 2012-05-13 DIAGNOSIS — Z7982 Long term (current) use of aspirin: Secondary | ICD-10-CM | POA: Insufficient documentation

## 2012-05-13 DIAGNOSIS — S298XXA Other specified injuries of thorax, initial encounter: Secondary | ICD-10-CM | POA: Insufficient documentation

## 2012-05-13 DIAGNOSIS — Y92838 Other recreation area as the place of occurrence of the external cause: Secondary | ICD-10-CM | POA: Insufficient documentation

## 2012-05-13 DIAGNOSIS — Z79899 Other long term (current) drug therapy: Secondary | ICD-10-CM | POA: Insufficient documentation

## 2012-05-13 DIAGNOSIS — I499 Cardiac arrhythmia, unspecified: Secondary | ICD-10-CM | POA: Insufficient documentation

## 2012-05-13 DIAGNOSIS — R0602 Shortness of breath: Secondary | ICD-10-CM | POA: Insufficient documentation

## 2012-05-13 DIAGNOSIS — E119 Type 2 diabetes mellitus without complications: Secondary | ICD-10-CM | POA: Insufficient documentation

## 2012-05-13 DIAGNOSIS — Y9239 Other specified sports and athletic area as the place of occurrence of the external cause: Secondary | ICD-10-CM | POA: Insufficient documentation

## 2012-05-13 DIAGNOSIS — J45909 Unspecified asthma, uncomplicated: Secondary | ICD-10-CM | POA: Insufficient documentation

## 2012-05-13 MED ORDER — HYDROCODONE-ACETAMINOPHEN 5-325 MG PO TABS
1.0000 | ORAL_TABLET | Freq: Once | ORAL | Status: AC
  Administered 2012-05-13: 1 via ORAL
  Filled 2012-05-13: qty 1

## 2012-05-13 MED ORDER — IBUPROFEN 800 MG PO TABS
800.0000 mg | ORAL_TABLET | Freq: Once | ORAL | Status: AC
  Administered 2012-05-13: 800 mg via ORAL
  Filled 2012-05-13: qty 1

## 2012-05-13 MED ORDER — IBUPROFEN 800 MG PO TABS: 800.0000 mg | ORAL_TABLET | Freq: Three times a day (TID) | ORAL | Status: DC

## 2012-05-13 MED ORDER — HYDROCODONE-ACETAMINOPHEN 5-325 MG PO TABS: 1.0000 | ORAL_TABLET | ORAL | Status: AC | PRN

## 2012-05-13 NOTE — ED Notes (Signed)
Patient presents to ER with c/o left rib pain and shortness of breath.  Wife states that patient was horseplaying with a nephew on Saturday and injured left ribs.  Patient c/o increased pain, cough and shortness of breath.

## 2012-05-13 NOTE — ED Provider Notes (Signed)
History     CSN: [id number]  Arrival date & time 05/13/12  0018   First MD Initiated Contact with Patient 05/13/12 0033      Chief Complaint  Patient presents with  . Rib Injury  . Shortness of Breath    (Consider location/radiation/quality/duration/timing/severity/associated sxs/prior treatment) HPI  [name] is a 56 y.o. male who presents to the Emergency Department complaining of left rib pain after wrestling with a nephew Saturday and falling against a knee. He felt a "pop" at the time. Has had pain since with deep breathing, movement and with palpation.  PCP Dr. [name]  Past Medical History  Diagnosis Date  . PONV (postoperative nausea and vomiting)   . Asthma   . Dysrhythmia   . Diabetes mellitus   . GERD (gastroesophageal reflux disease)     Past Surgical History  Procedure Date  . Hernia repair     2010, september  . Vasectomy 1989  . Colonoscopy 08/31/2011    Procedure: COLONOSCOPY;  Surgeon: [name], MD;  Location: [facility];  Service: Endoscopy;  Laterality: N/A;  8: 15 Am    Family History  Problem Relation Age of Onset  . Heart disease    . Arthritis    . Lung disease    . Cancer    . Asthma    . Diabetes    . Kidney disease      History  Substance Use Topics  . Smoking status: Former Research scientist (life sciences)  . Smokeless tobacco: Not on file  . Alcohol Use: No      Review of Systems  Constitutional: Negative for fever.       10 Systems reviewed and are negative for acute change except as noted in the HPI.  HENT: Negative for congestion.   Eyes: Negative for discharge and redness.  Respiratory: Negative for cough and shortness of breath.        Chest wall pain  Cardiovascular: Negative for chest pain.  Gastrointestinal: Negative for vomiting and abdominal pain.  Musculoskeletal: Negative for back pain.  Skin: Negative for rash.  Neurological: Negative for syncope, numbness and headaches.  Psychiatric/Behavioral:       No behavior  change.    Allergies  Codeine  Home Medications   Current Outpatient Rx  Name  Route  Sig  Dispense  Refill  . ALBUTEROL SULFATE HFA 108 (90 BASE) MCG/ACT IN AERS   Inhalation   Inhale 2 puffs into the lungs every 6 (six) hours as needed. For shortness of breath         . ALBUTEROL SULFATE (2.5 MG/3ML) 0.083% IN NEBU   Nebulization   Take 2.5 mg by nebulization every 6 (six) hours as needed. For shortness of breath         . ASPIRIN EC 81 MG PO TBEC   Oral   Take 81 mg by mouth daily.         . CHOLINE FENOFIBRATE 135 MG PO CPDR   Oral   Take 135 mg by mouth daily.         [name] DILTIAZEM HCL ER 240 MG PO CP24   Oral   Take 240 mg by mouth daily.         . IPRATROPIUM-ALBUTEROL 0.5-2.5 (3) MG/3ML IN SOLN   Nebulization   Take 3 mLs by nebulization every 6 (six) hours as needed. For shortness of breath         . ADULT MULTIVITAMIN W/MINERALS [facility]  Oral   Take 1 tablet by mouth daily.         [name] SIMVASTATIN 40 MG PO TABS   Oral   Take 40 mg by mouth every evening.         [name] SITAGLIPTIN-METFORMIN HCL 50-1000 MG PO TABS   Oral   Take 1 tablet by mouth 2 (two) times daily with a meal.         . DICLOFENAC SODIUM 50 MG PO TBEC   Oral   Take 1 tablet (50 mg total) by mouth 2 (two) times daily.   60 tablet   5   . LEVAQUIN PO   Oral   Take by mouth.         . METFORMIN HCL 500 MG PO TABS   Oral   Take 500 mg by mouth 2 (two) times daily with a meal.           BP 115/75  Pulse 109  Temp 98 F (36.7 C) (Oral)  Resp 18  Ht 5' 7"  (1.702 m)  Wt 160 lb (72.576 kg)  BMI 25.06 kg/m2  SpO2 97%  Physical Exam  Nursing note and vitals reviewed. Constitutional: He appears well-developed and well-nourished.       Awake, alert, nontoxic appearance.  HENT:  Head: Atraumatic.  Eyes: Right eye exhibits no discharge. Left eye exhibits no discharge.  Neck: Neck supple.  Cardiovascular: Normal heart sounds.   Pulmonary/Chest: Effort normal. He  exhibits no tenderness.       Left chest wall pain at the anterior axillary line. No bruising or lesions noted. No deformity.  Abdominal: Soft. There is no tenderness. There is no rebound.  Musculoskeletal: He exhibits no tenderness.       Baseline ROM, no obvious new focal weakness.  Neurological:       Mental status and motor strength appears baseline for patient and situation.  Skin: No rash noted.  Psychiatric: He has a normal mood and affect.    ED Course  Procedures (including critical care time)  Dg Ribs Unilateral W/chest Left  05/13/2012  *RADIOLOGY REPORT*  Clinical Data: Injured left ribs 2 days ago, with progressively worsening pain.  Cough.  Former smoker with current history of asthma.  LEFT RIBS AND CHEST - 3+ VIEW  Comparison: No prior rib imaging.  Portable chest x-ray 04/03/2009 and two-view chest x-ray 12/16/2006.  Bone window images from CTA chest 09/14/2005  Findings: Old healed fracture involving the left posterolateral seventh rib, noted on the prior CT. No acute fractures involving the left ribs.  No other intrinsic osseous abnormalities.  Cardiomediastinal silhouette unremarkable and unchanged.  Calcified granulomata throughout both upper lobes, the largest in the right upper lobe, unchanged.  Pleuroparenchymal scarring at the bases with blunting of the costophrenic angles, unchanged.  No new pulmonary parenchymal abnormalities.  IMPRESSION:  1.  No acute left rib fractures.  Old healed posterolateral seventh rib fracture. 2.  Old granulomatous disease.  No acute cardiopulmonary disease.   Original Report Authenticated By: Evangeline Dakin, M.D.      MDM  Patient with chest wall pain after wrestling on Saturday. Xray with no acute findings. PE with tenderness to left chest wall. Dx testing d/w pt and wife. Questions answered.  Verb understanding, agreeable to d/c home with outpt f/u.Pt stable in ED with no significant deterioration in condition.The patient appears  reasonably screened and/or stabilized for discharge and I doubt any other medical condition or other [facility] requiring further screening,  evaluation, or treatment in the ED at this time prior to discharge.  MDM Reviewed: nursing note and vitals Interpretation: x-ray            Gypsy Balsam. Olin Hauser, MD 05/13/12 0120

## 2012-10-31 ENCOUNTER — Other Ambulatory Visit (HOSPITAL_COMMUNITY): Payer: Self-pay | Admitting: Pulmonary Disease

## 2012-10-31 DIAGNOSIS — R1011 Right upper quadrant pain: Secondary | ICD-10-CM

## 2012-10-31 DIAGNOSIS — R112 Nausea with vomiting, unspecified: Secondary | ICD-10-CM

## 2012-11-07 ENCOUNTER — Ambulatory Visit (HOSPITAL_COMMUNITY)
Admission: RE | Admit: 2012-11-07 | Discharge: 2012-11-07 | Disposition: A | Discharge status: Home / Self Care | Payer: 59 | Source: Ambulatory Visit | Attending: Pulmonary Disease | Admitting: Pulmonary Disease

## 2012-11-07 DIAGNOSIS — R112 Nausea with vomiting, unspecified: Secondary | ICD-10-CM | POA: Insufficient documentation

## 2012-11-07 DIAGNOSIS — K838 Other specified diseases of biliary tract: Secondary | ICD-10-CM | POA: Insufficient documentation

## 2012-11-07 DIAGNOSIS — E119 Type 2 diabetes mellitus without complications: Secondary | ICD-10-CM | POA: Insufficient documentation

## 2012-11-07 DIAGNOSIS — R9389 Abnormal findings on diagnostic imaging of other specified body structures: Secondary | ICD-10-CM | POA: Insufficient documentation

## 2012-11-07 DIAGNOSIS — N200 Calculus of kidney: Secondary | ICD-10-CM | POA: Insufficient documentation

## 2012-11-07 DIAGNOSIS — R1011 Right upper quadrant pain: Secondary | ICD-10-CM | POA: Insufficient documentation

## 2012-11-07 DIAGNOSIS — E7800 Pure hypercholesterolemia, unspecified: Secondary | ICD-10-CM | POA: Insufficient documentation

## 2012-11-12 ENCOUNTER — Other Ambulatory Visit (HOSPITAL_COMMUNITY): Payer: Self-pay | Admitting: Pulmonary Disease

## 2012-11-12 DIAGNOSIS — N281 Cyst of kidney, acquired: Secondary | ICD-10-CM

## 2012-11-14 ENCOUNTER — Ambulatory Visit (HOSPITAL_COMMUNITY)
Admission: RE | Admit: 2012-11-14 | Discharge: 2012-11-14 | Disposition: A | Discharge status: Home / Self Care | Payer: 59 | Source: Ambulatory Visit | Attending: Pulmonary Disease | Admitting: Pulmonary Disease

## 2012-11-14 ENCOUNTER — Encounter (HOSPITAL_COMMUNITY): Payer: Self-pay

## 2012-11-14 DIAGNOSIS — Q619 Cystic kidney disease, unspecified: Secondary | ICD-10-CM | POA: Insufficient documentation

## 2012-11-14 DIAGNOSIS — N281 Cyst of kidney, acquired: Secondary | ICD-10-CM

## 2012-11-14 DIAGNOSIS — R9389 Abnormal findings on diagnostic imaging of other specified body structures: Secondary | ICD-10-CM | POA: Insufficient documentation

## 2012-11-14 LAB — POCT I-STAT, CHEM 8
Chloride: 107 mEq/L (ref 96–112)
Glucose, Bld: 150 mg/dL — ABNORMAL HIGH (ref 70–99)
HCT: 48 % (ref 39.0–52.0)
Hemoglobin: 16.3 g/dL (ref 13.0–17.0)
Potassium: 4.1 mEq/L (ref 3.5–5.1)
Sodium: 143 mEq/L (ref 135–145)

## 2012-11-14 MED ORDER — GADOBENATE DIMEGLUMINE 529 MG/ML IV SOLN
14.0000 mL | Freq: Once | INTRAVENOUS | Status: AC | PRN
  Administered 2012-11-14: 14 mL via INTRAVENOUS

## 2012-11-14 NOTE — Progress Notes (Signed)
Blood sample obtained from left arm IV for Creatnine level.  

## 2012-11-24 ENCOUNTER — Other Ambulatory Visit: Payer: Self-pay | Admitting: *Deleted

## 2012-11-24 MED ORDER — CHOLINE FENOFIBRATE 135 MG PO CPDR: 135.0000 mg | DELAYED_RELEASE_CAPSULE | Freq: Every day | ORAL | Status: DC

## 2012-11-24 MED ORDER — DILTIAZEM HCL ER 240 MG PO CP24: 240.0000 mg | ORAL_CAPSULE | Freq: Every day | ORAL | Status: DC

## 2012-11-25 ENCOUNTER — Other Ambulatory Visit: Payer: Self-pay | Admitting: *Deleted

## 2012-11-25 MED ORDER — CHOLINE FENOFIBRATE 135 MG PO CPDR: 135.0000 mg | DELAYED_RELEASE_CAPSULE | Freq: Every day | ORAL | Status: DC

## 2012-11-25 MED ORDER — DILTIAZEM HCL ER 240 MG PO CP24: 240.0000 mg | ORAL_CAPSULE | Freq: Every day | ORAL | Status: DC

## 2012-12-08 ENCOUNTER — Other Ambulatory Visit (HOSPITAL_COMMUNITY): Payer: Self-pay

## 2012-12-17 ENCOUNTER — Other Ambulatory Visit (HOSPITAL_COMMUNITY): Payer: Self-pay | Admitting: Pulmonary Disease

## 2012-12-17 ENCOUNTER — Ambulatory Visit (HOSPITAL_COMMUNITY)
Admission: RE | Admit: 2012-12-17 | Discharge: 2012-12-17 | Disposition: A | Discharge status: Home / Self Care | Payer: 59 | Source: Ambulatory Visit | Attending: Pulmonary Disease | Admitting: Pulmonary Disease

## 2012-12-17 DIAGNOSIS — R0609 Other forms of dyspnea: Secondary | ICD-10-CM | POA: Insufficient documentation

## 2012-12-17 DIAGNOSIS — R0989 Other specified symptoms and signs involving the circulatory and respiratory systems: Secondary | ICD-10-CM | POA: Insufficient documentation

## 2012-12-17 DIAGNOSIS — M79642 Pain in left hand: Secondary | ICD-10-CM

## 2012-12-17 DIAGNOSIS — J4489 Other specified chronic obstructive pulmonary disease: Secondary | ICD-10-CM | POA: Insufficient documentation

## 2012-12-17 DIAGNOSIS — J449 Chronic obstructive pulmonary disease, unspecified: Secondary | ICD-10-CM | POA: Insufficient documentation

## 2012-12-17 DIAGNOSIS — M79609 Pain in unspecified limb: Secondary | ICD-10-CM | POA: Insufficient documentation

## 2012-12-17 MED ORDER — ALBUTEROL SULFATE (5 MG/ML) 0.5% IN NEBU
2.5000 mg | INHALATION_SOLUTION | Freq: Once | RESPIRATORY_TRACT | Status: AC
  Administered 2012-12-17: 2.5 mg via RESPIRATORY_TRACT

## 2012-12-22 NOTE — Procedures (Signed)
[name], [name]                ACCOUNT NO.:  [id number]  MEDICAL RECORD NO.:  [id number]  LOCATION:                                 FACILITY:  PHYSICIAN:  Doil Kamara L. [name], M.D.DATE OF BIRTH:  [birth year]  DATE OF PROCEDURE:  12/19/2012 DATE OF DISCHARGE:                           PULMONARY FUNCTION TEST   REASON FOR PULMONARY FUNCTION TESTING:  COPD.  1. Spirometry shows a severe ventilatory defect with evidence of     airflow obstruction. 2. Lung volumes show marked air trapping. 3. DLCO is mildly-to-moderately reduced. 4. Airway of resistance is markedly elevated confirming the presence     of airflow obstruction. 5. There is significant bronchodilator improvement. 6. This study is consistent with the clinical diagnosis of COPD.     Corrin Sieling L. [name], M.D.     ELH/MEDQ  D:  12/18/2012  T:  12/19/2012  Job:  [id number]

## 2012-12-23 LAB — PULMONARY FUNCTION TEST

## 2013-03-06 ENCOUNTER — Other Ambulatory Visit (HOSPITAL_COMMUNITY): Payer: Self-pay | Admitting: Pulmonary Disease

## 2013-03-06 DIAGNOSIS — R140 Abdominal distension (gaseous): Secondary | ICD-10-CM

## 2013-03-11 ENCOUNTER — Ambulatory Visit (HOSPITAL_COMMUNITY)
Admission: RE | Admit: 2013-03-11 | Discharge: 2013-03-11 | Disposition: A | Discharge status: Home / Self Care | Payer: 59 | Source: Ambulatory Visit | Attending: Pulmonary Disease | Admitting: Pulmonary Disease

## 2013-03-11 DIAGNOSIS — R140 Abdominal distension (gaseous): Secondary | ICD-10-CM

## 2013-03-11 DIAGNOSIS — K838 Other specified diseases of biliary tract: Secondary | ICD-10-CM | POA: Insufficient documentation

## 2013-03-11 DIAGNOSIS — R141 Gas pain: Secondary | ICD-10-CM | POA: Insufficient documentation

## 2013-03-11 DIAGNOSIS — N281 Cyst of kidney, acquired: Secondary | ICD-10-CM | POA: Insufficient documentation

## 2013-03-11 DIAGNOSIS — R161 Splenomegaly, not elsewhere classified: Secondary | ICD-10-CM | POA: Insufficient documentation

## 2013-03-11 DIAGNOSIS — K7689 Other specified diseases of liver: Secondary | ICD-10-CM | POA: Insufficient documentation

## 2013-03-11 DIAGNOSIS — R142 Eructation: Secondary | ICD-10-CM | POA: Insufficient documentation

## 2013-04-01 ENCOUNTER — Encounter: Payer: Self-pay | Admitting: Internal Medicine

## 2013-04-21 ENCOUNTER — Encounter: Payer: Self-pay | Admitting: Gastroenterology

## 2013-04-21 ENCOUNTER — Encounter (INDEPENDENT_AMBULATORY_CARE_PROVIDER_SITE_OTHER): Payer: Self-pay

## 2013-04-21 ENCOUNTER — Ambulatory Visit (INDEPENDENT_AMBULATORY_CARE_PROVIDER_SITE_OTHER): Payer: 59 | Admitting: Gastroenterology

## 2013-04-21 VITALS — BP 124/69 | HR 99 | Temp 98.4°F | Ht 67.0 in | Wt 172.0 lb

## 2013-04-21 DIAGNOSIS — G8929 Other chronic pain: Secondary | ICD-10-CM

## 2013-04-21 DIAGNOSIS — R141 Gas pain: Secondary | ICD-10-CM

## 2013-04-21 DIAGNOSIS — R161 Splenomegaly, not elsewhere classified: Secondary | ICD-10-CM | POA: Insufficient documentation

## 2013-04-21 DIAGNOSIS — R197 Diarrhea, unspecified: Secondary | ICD-10-CM

## 2013-04-21 DIAGNOSIS — R140 Abdominal distension (gaseous): Secondary | ICD-10-CM

## 2013-04-21 DIAGNOSIS — K760 Fatty (change of) liver, not elsewhere classified: Secondary | ICD-10-CM

## 2013-04-21 DIAGNOSIS — K7689 Other specified diseases of liver: Secondary | ICD-10-CM

## 2013-04-21 DIAGNOSIS — R1084 Generalized abdominal pain: Secondary | ICD-10-CM | POA: Insufficient documentation

## 2013-04-21 MED ORDER — ALIGN 4 MG PO CAPS: 4.0000 mg | ORAL_CAPSULE | Freq: Every day | ORAL | Status: DC

## 2013-04-21 NOTE — Assessment & Plan Note (Addendum)
57 year old with several month history of abdominal bloating associated with generalized abdominal discomfort/pain. Back in April he had postprandial nausea and vomiting with greasy foods but this improved after starting Prilosec. Patient states he has no problems eating and actually feels hungry all the time. However after eating he feels very full and bloated. He can wake up that way as well. He does have some solid food esophageal dysphagia. Recent medication changes for his diabetes noted. One month history of 3-4 BMs in the mornings, [place] stool 5. Previously he was [place] stool 1. Doubt we are dealing with infectious etiology. ?medication related. TCS up to date.  Differential diagnosis quite broad including biliary, gastritis/peptic ulcer disease/GERD, medication related. Etiology of splenomegaly not clear but it is interesting that his spleen appear normal 4 months ago. At this point we will obtain labs, obtain weight from 6 months ago from his PCP, I will review all the studies with the radiologist to determine stability of the splenomegaly. He will likely need EGD +/- ED in the near future.   Fatty liver, nonalcoholic. Discussed at length. He needs to adequately control his diabetes, lose weight. Exercise is an issue given his COPD.

## 2013-04-21 NOTE — Progress Notes (Signed)
cc'd to pcp 

## 2013-04-21 NOTE — Patient Instructions (Signed)
1. Please have your blood work done. 2. Start Align one daily for three weeks. 3. I will review your imaging studies with the radiologist and get back with you with further information.  Instructions for fatty liver: Recommend 1-2# weight loss per week until ideal body weight through exercise & diet. Low fat/cholesterol diet.   Avoid sweets, sodas, fruit juices, sweetened beverages like tea, etc. Gradually increase exercise from 15 min daily up to 1 hr per day 5 days/week. Limit alcohol use.  Fatty Liver Fatty liver is the accumulation of fat in liver cells. It is also called hepatosteatosis or steatohepatitis. It is normal for your liver to contain some fat. If fat is more than 5 to 10% of your liver's weight, you have fatty liver.  There are often no symptoms (problems) for years while damage is still occurring. People often learn about their fatty liver when they have medical tests for other reasons. Fat can damage your liver for years or even decades without causing problems. When it becomes severe, it can cause fatigue, weight loss, weakness, and confusion. This makes you more likely to develop more serious liver problems. The liver is the largest organ in the body. It does a lot of work and often gives no warning signs when it is sick until late in a disease. The liver has many important jobs including:  Breaking down foods.  Storing vitamins, iron, and other minerals.  Making proteins.  Making bile for food digestion.  Breaking down many products including medications, alcohol and some poisons. CAUSES  There are a number of different conditions, medications, and poisons that can cause a fatty liver. Eating too many calories causes fat to build up in the liver. Not processing and breaking fats down normally may also cause this. Certain conditions, such as obesity, diabetes, and high triglycerides also cause this. Most fatty liver patients tend to be middle-aged and over weight.    Some causes of fatty liver are:  Alcohol over consumption.  Malnutrition.  Steroid use.  Valproic acid toxicity.  Obesity.  Cushing's syndrome.  Poisons.  Tetracycline in high dosages.  Pregnancy.  Diabetes.  Hyperlipidemia.  Rapid weight loss. Some people develop fatty liver even having none of these conditions. SYMPTOMS  Fatty liver most often causes no problems. This is called asymptomatic.  It can be diagnosed with blood tests and also by a liver biopsy.  It is one of the most common causes of minor elevations of liver enzymes on routine blood tests.  Specialized Imaging of the liver using ultrasound, CT (computed tomography) scan, or MRI (magnetic resonance imaging) can suggest a fatty liver but a biopsy is needed to confirm it.  A biopsy involves taking a small sample of liver tissue. This is done by using a needle. It is then looked at under a microscope by a specialist. TREATMENT  It is important to treat the cause. Simple fatty liver without a medical reason may not need treatment.  Weight loss, fat restriction, and exercise in overweight patients produces inconsistent results but is worth trying.  Fatty liver due to alcohol toxicity may not improve even with stopping drinking.  Good control of diabetes may reduce fatty liver.  Lower your triglycerides through diet, medication or both.  Eat a balanced, healthy diet.  Increase your physical activity.  Get regular checkups from a liver specialist.  There are no medical or surgical treatments for a fatty liver or NASH, but improving your diet and increasing your exercise may  help prevent or reverse some of the damage. PROGNOSIS  Fatty liver may cause no damage or it can lead to an inflammation of the liver. This is, called steatohepatitis. When it is linked to alcohol abuse, it is called alcoholic steatohepatitis. It often is not linked to alcohol. It is then called nonalcoholic steatohepatitis, or NASH.  Over time the liver may become scarred and hardened. This condition is called cirrhosis. Cirrhosis is serious and may lead to liver failure or cancer. NASH is one of the leading causes of cirrhosis. About 10-20% of Americans have fatty liver and a smaller 2-5% has NASH. Document Released: 08/10/2005 Document Revised: 09/17/2011 Document Reviewed: 10/03/2005 [facility] Patient Information 2014 [place].

## 2013-04-21 NOTE — Progress Notes (Addendum)
Primary Care Physician: Alonza Bogus, MD  Primary Gastroenterologist:  Garfield Cornea, MD   Chief Complaint  Patient presents with  . Abdominal Pain    HPI: [name] is a 57 y.o. male here at the request of Dr. [name] for further evaluation of abdominal pain/bloating. He had an abdominal ultrasound 03/06/2013 showed gallbladder sludge, splenomegaly, fatty liver. MR abdomen with and without contrast back in May 2014 to followup with indeterminate cystic lesion seen on ultrasound showed several benign renal lesions require no further followup. Spleen was reported to be normal except for tiny cyst versus pseudocyst. Abdominal ultrasound back in April of this year reported normal spleen.  Couple of months abdominal bloating/swelling/pain worse with meals. No n/v. BM several 3-4 in the mornings. Mostly loose. Bowel issues for about one month. Couple of months of adding metformin combination. Prilosec for several months. Helped pp N/V that he was initially having back in 10/2012. No early satiety. No heartburn on PPI. Sometimes esophageal solid food dysphagia. No problems taking pills. No melena, brbpr. Feels hungry all the time. Not sure about weight gain. Occasional swelling of lower extremities.  Current Outpatient Prescriptions  Medication Sig Dispense Refill  . albuterol (PROVENTIL HFA;VENTOLIN HFA) 108 (90 BASE) MCG/ACT inhaler Inhale 2 puffs into the lungs every 6 (six) hours as needed. For shortness of breath      . ALPRAZolam (XANAX) 0.25 MG tablet Take 0.25 mg by mouth at bedtime as needed for sleep.      [name] aspirin EC 81 MG tablet Take 81 mg by mouth daily.      [name] atorvastatin (LIPITOR) 40 MG tablet Take 40 mg by mouth daily.      . Choline Fenofibrate (TRILIPIX) 135 MG capsule Take 1 capsule (135 mg total) by mouth daily.  90 capsule  3  . clotrimazole-betamethasone (LOTRISONE) cream Apply 1 application topically 2 (two) times daily.      [name] diltiazem (DILACOR XR) 240 MG 24 hr  capsule Take 1 capsule (240 mg total) by mouth daily.  90 capsule  3  . HYDROcodone-acetaminophen (NORCO/VICODIN) 5-325 MG per tablet Take 1 tablet by mouth every 6 (six) hours as needed for pain.      . Multiple Vitamin (MULITIVITAMIN WITH MINERALS) TABS Take 1 tablet by mouth daily.      [name] omeprazole (PRILOSEC) 20 MG capsule Take 20 mg by mouth daily.      . Saxagliptin-Metformin (KOMBIGLYZE XR) 2.11-998 MG TB24 Take 2.5-1,000 mg by mouth 2 (two) times daily.      . tamsulosin (FLOMAX) 0.4 MG CAPS capsule Take 0.4 mg by mouth daily.      [name] tiotropium (SPIRIVA) 18 MCG inhalation capsule Place 18 mcg into inhaler and inhale daily.       No current facility-administered medications for this visit.    Allergies as of 04/21/2013 - Review Complete 04/21/2013  Allergen Reaction Noted  . Codeine Rash 07/24/2011   Past Medical History  Diagnosis Date  . PONV (postoperative nausea and vomiting)   . Asthma   . Dysrhythmia   . Diabetes mellitus   . GERD (gastroesophageal reflux disease)   . COPD (chronic obstructive pulmonary disease)   . Anxiety   . Depression   . Splenomegaly    Past Surgical History  Procedure Laterality Date  . Hernia repair      2010, september  . Vasectomy  1989  . Colonoscopy  08/31/2011    [id number] rectal and colon polyps-treated. Single tubular adenoma. Next  TCS 08/2016   History  Substance Use Topics  . Smoking status: Former Research scientist (life sciences)  . Smokeless tobacco: Not on file  . Alcohol Use: No   Family History  Problem Relation Age of Onset  . Heart disease    . Arthritis    . Lung disease    . Cancer Mother     lymph nodes  . Asthma    . Diabetes    . Kidney disease    . Ovarian cancer Sister   . Colon cancer Neg Hx     ROS:  General: Negative for anorexia, weight loss, fever, chills, fatigue, weakness. ENT: Negative for hoarseness, difficulty swallowing , nasal congestion. CV: Negative for chest pain, angina, palpitations. Positive for dyspnea on  exertion, peripheral edema.  Respiratory: Negative for dyspnea at rest, cough, sputum, wheezing. Positive for dyspnea on exertion. GI: See history of present illness. GU:  Negative for dysuria, hematuria, urinary incontinence, urinary frequency, nocturnal urination.  Endo: Negative for unusual weight change.    Physical Examination:   BP 124/69  Pulse 99  Temp(Src) 98.4 F (36.9 C) (Oral)  Ht 5' 7"  (1.702 m)  Wt 172 lb (78.019 kg)  BMI 26.93 kg/m2  General: Well-nourished, well-developed in no acute distress. Accompanied by wife Eyes: No icterus. Mouth: Oropharyngeal mucosa moist and pink , no lesions erythema or exudate. Lungs: Clear to auscultation bilaterally.  Heart: Regular rate and rhythm, no murmurs rubs or gallops.  Abdomen: Bowel sounds are normal, nontender, slightly distended, no hepatosplenomegaly or masses, no abdominal bruits or hernia , no rebound or guarding.   Extremities: No lower extremity edema. No clubbing or deformities. Neuro: Alert and oriented x 4   Skin: Warm and dry, no jaundice.   Psych: Alert and cooperative, normal mood and affect.

## 2013-04-24 LAB — CBC WITH DIFFERENTIAL/PLATELET
Eosinophils Relative: 6 % — ABNORMAL HIGH (ref 0–5)
Hemoglobin: 14.7 g/dL (ref 13.0–17.0)
Lymphocytes Relative: 28 % (ref 12–46)
Lymphs Abs: 0.9 10*3/uL (ref 0.7–4.0)
MCV: 80.5 fL (ref 78.0–100.0)
Monocytes Relative: 9 % (ref 3–12)
Neutrophils Relative %: 56 % (ref 43–77)
Platelets: 97 10*3/uL — ABNORMAL LOW (ref 150–400)
RBC: 5.29 MIL/uL (ref 4.22–5.81)
WBC: 3.2 10*3/uL — ABNORMAL LOW (ref 4.0–10.5)

## 2013-04-24 LAB — COMPREHENSIVE METABOLIC PANEL
BUN: 18 mg/dL (ref 6–23)
CO2: 26 mEq/L (ref 19–32)
Glucose, Bld: 208 mg/dL — ABNORMAL HIGH (ref 70–99)
Sodium: 138 mEq/L (ref 135–145)
Total Bilirubin: 0.6 mg/dL (ref 0.3–1.2)
Total Protein: 7.3 g/dL (ref 6.0–8.3)

## 2013-04-24 LAB — LIPASE: Lipase: 40 U/L (ref 0–75)

## 2013-04-24 LAB — TSH: TSH: 3.369 u[IU]/mL (ref 0.350–4.500)

## 2013-04-27 NOTE — Progress Notes (Signed)
Requested Records

## 2013-04-27 NOTE — Progress Notes (Signed)
Quick Note:  Mild bump in ALT. I do not know his baseline. He also has mild thrombocytopenia which would go along with splenomegaly. His weight is up 6 pounds since 02/2013 per Dr. [name]' office.  I still need to look at films with radiologist. I need last three LFTs from PCP ASAP. ______

## 2013-04-27 NOTE — Progress Notes (Signed)
Requested Records.

## 2013-04-30 NOTE — Progress Notes (Signed)
Quick Note:  Labs from 06/2011 T bili 0.5, AP 41, AST 22, ALT 33 ______

## 2013-05-06 LAB — HEPATIC FUNCTION PANEL
AST: 22 U/L
Alkaline Phosphatase: 41 U/L

## 2013-05-07 ENCOUNTER — Telehealth: Payer: Self-pay | Admitting: Internal Medicine

## 2013-05-07 NOTE — Telephone Encounter (Signed)
  I responded about labs on 04/27/13 but it looks like the patient was never called.  I have had to wait on records a couple of times from Dr. [name]. I also have tried twice to speak with radiologist about reviewing his prior studies but they were in a procedure.   Please let him know. He has mild elevation of ALT new from 2012, may be secondary to lipitor and fatty liver. He has mild drop in platelet count, actually noted going back to 2010 at least.  At this time, I would advise EGD/ED with RMR for abd pain/bloating, dysphagia. This will allow [place] to evaluate for any signs of chronic liver disease as well.  We will determine next step for enlarged spleen based on EGD findings.

## 2013-05-07 NOTE — Telephone Encounter (Signed)
Wife called to see what the hold up was on hearing what the patients lab results were and if he needed to follow up. Pt had OV on 10/14 and labs done 10/17. He hasn't heard anything from [place] since and was concerned about what they needed to do. Please call patient at home [phone]

## 2013-05-07 NOTE — Telephone Encounter (Signed)
LSL do you still need anything for this Pt

## 2013-05-07 NOTE — Telephone Encounter (Signed)
Pt aware of results.   [name]  Can you set him up for a EGD/ED

## 2013-05-08 ENCOUNTER — Encounter (HOSPITAL_COMMUNITY): Payer: Self-pay | Admitting: Pharmacy Technician

## 2013-05-08 ENCOUNTER — Other Ambulatory Visit: Payer: Self-pay | Admitting: Internal Medicine

## 2013-05-08 DIAGNOSIS — R140 Abdominal distension (gaseous): Secondary | ICD-10-CM

## 2013-05-08 DIAGNOSIS — R1319 Other dysphagia: Secondary | ICD-10-CM

## 2013-05-08 DIAGNOSIS — R1013 Epigastric pain: Secondary | ICD-10-CM

## 2013-05-08 NOTE — Telephone Encounter (Signed)
Patient is scheduled for EGD/ED on 11/07 w/RMR and I have mailed him instructions and he is aware

## 2013-05-15 ENCOUNTER — Ambulatory Visit (HOSPITAL_COMMUNITY)
Admission: RE | Admit: 2013-05-15 | Discharge: 2013-05-15 | Disposition: A | Discharge status: Home / Self Care | Payer: 59 | Source: Ambulatory Visit | Attending: Internal Medicine | Admitting: Internal Medicine

## 2013-05-15 ENCOUNTER — Encounter (HOSPITAL_COMMUNITY)
Admission: RE | Disposition: A | Discharge status: Home / Self Care | Payer: Self-pay | Source: Ambulatory Visit | Attending: Internal Medicine

## 2013-05-15 ENCOUNTER — Encounter (HOSPITAL_COMMUNITY): Payer: Self-pay | Admitting: *Deleted

## 2013-05-15 DIAGNOSIS — E119 Type 2 diabetes mellitus without complications: Secondary | ICD-10-CM | POA: Insufficient documentation

## 2013-05-15 DIAGNOSIS — R142 Eructation: Secondary | ICD-10-CM | POA: Insufficient documentation

## 2013-05-15 DIAGNOSIS — R1310 Dysphagia, unspecified: Secondary | ICD-10-CM | POA: Insufficient documentation

## 2013-05-15 DIAGNOSIS — R141 Gas pain: Secondary | ICD-10-CM | POA: Insufficient documentation

## 2013-05-15 DIAGNOSIS — R109 Unspecified abdominal pain: Secondary | ICD-10-CM | POA: Insufficient documentation

## 2013-05-15 DIAGNOSIS — J4489 Other specified chronic obstructive pulmonary disease: Secondary | ICD-10-CM | POA: Insufficient documentation

## 2013-05-15 DIAGNOSIS — K319 Disease of stomach and duodenum, unspecified: Secondary | ICD-10-CM | POA: Insufficient documentation

## 2013-05-15 DIAGNOSIS — R143 Flatulence: Secondary | ICD-10-CM

## 2013-05-15 DIAGNOSIS — K2960 Other gastritis without bleeding: Secondary | ICD-10-CM | POA: Insufficient documentation

## 2013-05-15 DIAGNOSIS — R1319 Other dysphagia: Secondary | ICD-10-CM

## 2013-05-15 DIAGNOSIS — R140 Abdominal distension (gaseous): Secondary | ICD-10-CM

## 2013-05-15 DIAGNOSIS — J449 Chronic obstructive pulmonary disease, unspecified: Secondary | ICD-10-CM | POA: Insufficient documentation

## 2013-05-15 DIAGNOSIS — R1013 Epigastric pain: Secondary | ICD-10-CM

## 2013-05-15 HISTORY — PX: ESOPHAGOGASTRODUODENOSCOPY (EGD) WITH ESOPHAGEAL DILATION: SHX5812

## 2013-05-15 SURGERY — ESOPHAGOGASTRODUODENOSCOPY (EGD) WITH ESOPHAGEAL DILATION
Anesthesia: Moderate Sedation

## 2013-05-15 MED ORDER — SODIUM CHLORIDE 0.9 % IV SOLN
INTRAVENOUS | Status: DC
  Administered 2013-05-15: 1000 mL via INTRAVENOUS

## 2013-05-15 MED ORDER — MIDAZOLAM HCL 5 MG/5ML IJ SOLN
INTRAMUSCULAR | Status: AC
  Filled 2013-05-15: qty 10

## 2013-05-15 MED ORDER — MEPERIDINE HCL 100 MG/ML IJ SOLN
INTRAMUSCULAR | Status: AC
  Filled 2013-05-15: qty 2

## 2013-05-15 MED ORDER — BUTAMBEN-TETRACAINE-BENZOCAINE 2-2-14 % EX AERO
INHALATION_SPRAY | CUTANEOUS | Status: DC | PRN
  Administered 2013-05-15: 2 via TOPICAL

## 2013-05-15 MED ORDER — MIDAZOLAM HCL 5 MG/5ML IJ SOLN
INTRAMUSCULAR | Status: DC | PRN
  Administered 2013-05-15: 2 mg via INTRAVENOUS
  Administered 2013-05-15: 1 mg via INTRAVENOUS

## 2013-05-15 MED ORDER — ONDANSETRON HCL 4 MG/2ML IJ SOLN
INTRAMUSCULAR | Status: AC
  Filled 2013-05-15: qty 2

## 2013-05-15 MED ORDER — MEPERIDINE HCL 100 MG/ML IJ SOLN
INTRAMUSCULAR | Status: DC | PRN
  Administered 2013-05-15: 50 mg via INTRAVENOUS

## 2013-05-15 MED ORDER — ONDANSETRON HCL 4 MG/2ML IJ SOLN
INTRAMUSCULAR | Status: DC | PRN
  Administered 2013-05-15: 4 mg via INTRAVENOUS

## 2013-05-15 NOTE — H&P (View-Only) (Signed)
Primary Care Physician: Alonza Bogus, MD  Primary Gastroenterologist:  Garfield Cornea, MD   Chief Complaint  Patient presents with  . Abdominal Pain    HPI: [name] is a 57 y.o. male here at the request of Dr. [name] for further evaluation of abdominal pain/bloating. He had an abdominal ultrasound 03/06/2013 showed gallbladder sludge, splenomegaly, fatty liver. MR abdomen with and without contrast back in May 2014 to followup with indeterminate cystic lesion seen on ultrasound showed several benign renal lesions require no further followup. Spleen was reported to be normal except for tiny cyst versus pseudocyst. Abdominal ultrasound back in April of this year reported normal spleen.  Couple of months abdominal bloating/swelling/pain worse with meals. No n/v. BM several 3-4 in the mornings. Mostly loose. Bowel issues for about one month. Couple of months of adding metformin combination. Prilosec for several months. Helped pp N/V that he was initially having back in 10/2012. No early satiety. No heartburn on PPI. Sometimes esophageal solid food dysphagia. No problems taking pills. No melena, brbpr. Feels hungry all the time. Not sure about weight gain. Occasional swelling of lower extremities.  Current Outpatient Prescriptions  Medication Sig Dispense Refill  . albuterol (PROVENTIL HFA;VENTOLIN HFA) 108 (90 BASE) MCG/ACT inhaler Inhale 2 puffs into the lungs every 6 (six) hours as needed. For shortness of breath      . ALPRAZolam (XANAX) 0.25 MG tablet Take 0.25 mg by mouth at bedtime as needed for sleep.      [name] aspirin EC 81 MG tablet Take 81 mg by mouth daily.      [name] atorvastatin (LIPITOR) 40 MG tablet Take 40 mg by mouth daily.      . Choline Fenofibrate (TRILIPIX) 135 MG capsule Take 1 capsule (135 mg total) by mouth daily.  90 capsule  3  . clotrimazole-betamethasone (LOTRISONE) cream Apply 1 application topically 2 (two) times daily.      [name] diltiazem (DILACOR XR) 240 MG 24 hr  capsule Take 1 capsule (240 mg total) by mouth daily.  90 capsule  3  . HYDROcodone-acetaminophen (NORCO/VICODIN) 5-325 MG per tablet Take 1 tablet by mouth every 6 (six) hours as needed for pain.      . Multiple Vitamin (MULITIVITAMIN WITH MINERALS) TABS Take 1 tablet by mouth daily.      [name] omeprazole (PRILOSEC) 20 MG capsule Take 20 mg by mouth daily.      . Saxagliptin-Metformin (KOMBIGLYZE XR) 2.11-998 MG TB24 Take 2.5-1,000 mg by mouth 2 (two) times daily.      . tamsulosin (FLOMAX) 0.4 MG CAPS capsule Take 0.4 mg by mouth daily.      [name] tiotropium (SPIRIVA) 18 MCG inhalation capsule Place 18 mcg into inhaler and inhale daily.       No current facility-administered medications for this visit.    Allergies as of 04/21/2013 - Review Complete 04/21/2013  Allergen Reaction Noted  . Codeine Rash 07/24/2011   Past Medical History  Diagnosis Date  . PONV (postoperative nausea and vomiting)   . Asthma   . Dysrhythmia   . Diabetes mellitus   . GERD (gastroesophageal reflux disease)   . COPD (chronic obstructive pulmonary disease)   . Anxiety   . Depression   . Splenomegaly    Past Surgical History  Procedure Laterality Date  . Hernia repair      2010, september  . Vasectomy  1989  . Colonoscopy  08/31/2011    HGD:JMEQASTM rectal and colon polyps-treated. Single tubular adenoma. Next  TCS 08/2016   History  Substance Use Topics  . Smoking status: Former Research scientist (life sciences)  . Smokeless tobacco: Not on file  . Alcohol Use: No   Family History  Problem Relation Age of Onset  . Heart disease    . Arthritis    . Lung disease    . Cancer Mother     lymph nodes  . Asthma    . Diabetes    . Kidney disease    . Ovarian cancer Sister   . Colon cancer Neg Hx     ROS:  General: Negative for anorexia, weight loss, fever, chills, fatigue, weakness. ENT: Negative for hoarseness, difficulty swallowing , nasal congestion. CV: Negative for chest pain, angina, palpitations. Positive for dyspnea on  exertion, peripheral edema.  Respiratory: Negative for dyspnea at rest, cough, sputum, wheezing. Positive for dyspnea on exertion. GI: See history of present illness. GU:  Negative for dysuria, hematuria, urinary incontinence, urinary frequency, nocturnal urination.  Endo: Negative for unusual weight change.    Physical Examination:   BP 124/69  Pulse 99  Temp(Src) 98.4 F (36.9 C) (Oral)  Ht 5' 7"  (1.702 m)  Wt 172 lb (78.019 kg)  BMI 26.93 kg/m2  General: Well-nourished, well-developed in no acute distress. Accompanied by wife Eyes: No icterus. Mouth: Oropharyngeal mucosa moist and pink , no lesions erythema or exudate. Lungs: Clear to auscultation bilaterally.  Heart: Regular rate and rhythm, no murmurs rubs or gallops.  Abdomen: Bowel sounds are normal, nontender, slightly distended, no hepatosplenomegaly or masses, no abdominal bruits or hernia , no rebound or guarding.   Extremities: No lower extremity edema. No clubbing or deformities. Neuro: Alert and oriented x 4   Skin: Warm and dry, no jaundice.   Psych: Alert and cooperative, normal mood and affect.

## 2013-05-15 NOTE — Op Note (Signed)
[facility] [address] [place], [address]   ENDOSCOPY PROCEDURE REPORT  PATIENT: [name], [name]  MR#: [id number] BIRTHDATE: 1955/09/24 , 70  yrs. old GENDER: Male ENDOSCOPIST: R.  Garfield Cornea, MD FACP FACG REFERRED BY:  Sinda Du, M.D. PROCEDURE DATE:  05/15/2013 PROCEDURE:      EGD with Venia Minks dilation followed by gastric biopsy  INDICATIONS:     abdominal bloating/esophageal dysphagia  INFORMED CONSENT:   The risks, benefits, limitations, alternatives and imponderables have been discussed.  The potential for biopsy, esophogeal dilation, etc. have also been reviewed.  Questions have been answered.  All parties agreeable.  Please see the history and physical in the medical record for more information.  MEDICATIONS: Versed 3 mg IV and Demerol 50 mg IV in divided doses.  DESCRIPTION OF PROCEDURE:   The EG-[id number] ([id number])  endoscope was introduced through the mouth and advanced to the second portion of the duodenum without difficulty or limitations.  The mucosal surfaces were surveyed very carefully during advancement of the scope and upon withdrawal.  Retroflexion view of the proximal stomach and esophagogastric junction was performed.      FINDINGS:   Normal-appearing tubular esophagus. No varices. Stomach empty. Some extrinsic Compression along the  lesser curvature -likely secondary to splenomegaly. Scattered antral erosions. Some "snake skinning" or"fishscale "appearance of the fundal/body mucosa consistent with portal gastropathy. No ulcer or infiltrating process. Patent pylorus.  Normal first and second portion of the duodenum  THERAPEUTIC / DIAGNOSTIC MANEUVERS PERFORMED:   A 56 French Maloney dilator was passed to full insertion easily. A look back revealed no apparent complication related to this maneuver. Subsequently, biopsies of the abnormal antral mucosa taken for histologic study.   COMPLICATIONS:  None  IMPRESSION:      Normal esophagus-status post Maloney dilation. Portal gastropathy. Antral erosions-status post biopsy  RECOMMENDATIONS:      Followup on pathology.    _______________________________ R. Garfield Cornea, MD FACP [facility] eSigned:  R. Garfield Cornea, MD FACP [facility] 05/15/2013 12:44 PM     CC:

## 2013-05-15 NOTE — Telephone Encounter (Signed)
I reviewed last two u/s with Dr. Thornton Papas. Spleen size has increased since 11/2012 and 02/2013. New splenomegaly. Patient has EGD today. Will let Dr. [name] know findings. May need hematology referral if no evidence of esophageal varices or other findings to suggest this is secondary to cirrhosis. Liver looked ok on recent imaging.  Patient does have chronic thrombocytopenia.

## 2013-05-15 NOTE — Interval H&P Note (Signed)
History and Physical Interval Note:  05/15/2013 12:20 PM  [name]  has presented today for surgery, with the diagnosis of Abdominal Pain and bloating and dysphagia  The various methods of treatment have been discussed with the patient and family. After consideration of risks, benefits and other options for treatment, the patient has consented to  Procedure(s) with comments: ESOPHAGOGASTRODUODENOSCOPY (EGD) WITH ESOPHAGEAL DILATION (N/A) - 3:45-moved to 2:30 [name] to notify pt as a surgical intervention .  The patient's history has been reviewed, patient examined, no change in status, stable for surgery.  I have reviewed the patient's chart and labs.  Questions were answered to the patient's satisfaction.     [name]  Progressive splenomegaly noted on imaging studies. EGD with possible esophageal dilation as appropriate.  The risks, benefits, limitations, alternatives and imponderables have been reviewed with the patient. Potential for esophageal dilation, biopsy, etc. have also been reviewed.  Questions have been answered. All parties agreeable.

## 2013-05-20 ENCOUNTER — Encounter (HOSPITAL_COMMUNITY): Payer: Self-pay | Admitting: Internal Medicine

## 2013-05-24 ENCOUNTER — Encounter: Payer: Self-pay | Admitting: Internal Medicine

## 2013-05-25 ENCOUNTER — Other Ambulatory Visit: Payer: Self-pay | Admitting: Internal Medicine

## 2013-05-25 ENCOUNTER — Telehealth: Payer: Self-pay | Admitting: Gastroenterology

## 2013-05-25 ENCOUNTER — Telehealth: Payer: Self-pay

## 2013-05-25 DIAGNOSIS — R1084 Generalized abdominal pain: Secondary | ICD-10-CM

## 2013-05-25 DIAGNOSIS — R140 Abdominal distension (gaseous): Secondary | ICD-10-CM

## 2013-05-25 MED ORDER — PANTOPRAZOLE SODIUM 40 MG PO TBEC: 40.0000 mg | DELAYED_RELEASE_TABLET | Freq: Every day | ORAL | Status: DC

## 2013-05-25 NOTE — Telephone Encounter (Signed)
Peer-to-peer review for CT A/P. Approved for 45 days. [phone].

## 2013-05-25 NOTE — Telephone Encounter (Signed)
Letter mailed to pt.  

## 2013-05-25 NOTE — Telephone Encounter (Signed)
Letter from: [name]   Send letter to patient.  Send copy of letter with path to referring provider and PCP.   Lets try a 1 month course of :Protonix 40 mg daily to see if this helps GI SX.  Last cross-sectional imaging of abdomen nearly 7 mos ago - MRI; need to re-assess significant extrinsic compression of stomach (may be the spleen)..Nedd contrast CT of abdomen and Pelvis to re-evaluate. May yet need further evaluation of GB.

## 2013-05-25 NOTE — Progress Notes (Signed)
Ct scheduled for Thurs Nov 20th at 4:45 and patient is aware

## 2013-05-25 NOTE — Telephone Encounter (Signed)
Pt aware, protonix sent to [facility]. Serena Colonel, pt stated it was ok to schedule ct.

## 2013-05-28 ENCOUNTER — Ambulatory Visit (HOSPITAL_COMMUNITY)
Admission: RE | Admit: 2013-05-28 | Discharge: 2013-05-28 | Disposition: A | Discharge status: Home / Self Care | Payer: 59 | Source: Ambulatory Visit | Attending: Internal Medicine | Admitting: Internal Medicine

## 2013-05-28 ENCOUNTER — Telehealth (HOSPITAL_COMMUNITY): Payer: Self-pay | Admitting: Internal Medicine

## 2013-05-28 DIAGNOSIS — K5730 Diverticulosis of large intestine without perforation or abscess without bleeding: Secondary | ICD-10-CM | POA: Insufficient documentation

## 2013-05-28 DIAGNOSIS — Q618 Other cystic kidney diseases: Secondary | ICD-10-CM | POA: Insufficient documentation

## 2013-05-28 DIAGNOSIS — R109 Unspecified abdominal pain: Secondary | ICD-10-CM | POA: Insufficient documentation

## 2013-05-28 DIAGNOSIS — R140 Abdominal distension (gaseous): Secondary | ICD-10-CM

## 2013-05-28 DIAGNOSIS — R1084 Generalized abdominal pain: Secondary | ICD-10-CM

## 2013-05-28 DIAGNOSIS — K7689 Other specified diseases of liver: Secondary | ICD-10-CM | POA: Insufficient documentation

## 2013-05-28 MED ORDER — ATORVASTATIN CALCIUM 40 MG PO TABS: 40.0000 mg | ORAL_TABLET | Freq: Every day | ORAL | Status: DC

## 2013-05-28 MED ORDER — CHOLINE FENOFIBRATE 135 MG PO CPDR: 135.0000 mg | DELAYED_RELEASE_CAPSULE | Freq: Every day | ORAL | Status: DC

## 2013-05-28 MED ORDER — DILTIAZEM HCL ER 240 MG PO CP24: 240.0000 mg | ORAL_CAPSULE | Freq: Every day | ORAL | Status: DC

## 2013-05-28 MED ORDER — IOHEXOL 300 MG/ML  SOLN
100.0000 mL | Freq: Once | INTRAMUSCULAR | Status: AC | PRN
  Administered 2013-05-28: 100 mL via INTRAVENOUS

## 2013-05-28 NOTE — Telephone Encounter (Signed)
Refill(s) sent to pharmacy 90-day supplies w/ 1 ref.

## 2013-05-28 NOTE — Telephone Encounter (Signed)
Patient is changing pharmacies and needs new prescriptions for the following----Diltiazem XT 240 mg 1 daily # 90---Atorvastatin 40 mg 1 daily # 90---Fenofebric 135 DR i daily # 90.  Please call to [facility] in [place], [place]

## 2013-06-03 ENCOUNTER — Encounter: Payer: Self-pay | Admitting: Internal Medicine

## 2013-07-06 ENCOUNTER — Encounter: Payer: Self-pay | Admitting: Gastroenterology

## 2013-07-06 ENCOUNTER — Ambulatory Visit (INDEPENDENT_AMBULATORY_CARE_PROVIDER_SITE_OTHER): Payer: 59 | Admitting: Gastroenterology

## 2013-07-06 VITALS — BP 122/73 | HR 87 | Temp 97.1°F | Ht 67.0 in | Wt 170.0 lb

## 2013-07-06 DIAGNOSIS — R945 Abnormal results of liver function studies: Secondary | ICD-10-CM

## 2013-07-06 DIAGNOSIS — K5900 Constipation, unspecified: Secondary | ICD-10-CM

## 2013-07-06 DIAGNOSIS — R141 Gas pain: Secondary | ICD-10-CM

## 2013-07-06 DIAGNOSIS — K7689 Other specified diseases of liver: Secondary | ICD-10-CM

## 2013-07-06 DIAGNOSIS — R109 Unspecified abdominal pain: Secondary | ICD-10-CM

## 2013-07-06 DIAGNOSIS — R140 Abdominal distension (gaseous): Secondary | ICD-10-CM

## 2013-07-06 DIAGNOSIS — R1030 Lower abdominal pain, unspecified: Secondary | ICD-10-CM

## 2013-07-06 DIAGNOSIS — K760 Fatty (change of) liver, not elsewhere classified: Secondary | ICD-10-CM

## 2013-07-06 DIAGNOSIS — R7989 Other specified abnormal findings of blood chemistry: Secondary | ICD-10-CM

## 2013-07-06 DIAGNOSIS — R161 Splenomegaly, not elsewhere classified: Secondary | ICD-10-CM

## 2013-07-06 DIAGNOSIS — K219 Gastro-esophageal reflux disease without esophagitis: Secondary | ICD-10-CM

## 2013-07-06 MED ORDER — LINACLOTIDE 145 MCG PO CAPS: 145.0000 ug | ORAL_CAPSULE | Freq: Every day | ORAL | Status: DC

## 2013-07-06 MED ORDER — PANTOPRAZOLE SODIUM 40 MG PO TBEC: 40.0000 mg | DELAYED_RELEASE_TABLET | Freq: Every day | ORAL | Status: DC

## 2013-07-06 NOTE — Progress Notes (Signed)
Primary Care Physician: Alonza Bogus, MD  Primary Gastroenterologist:  Garfield Cornea, MD   Chief Complaint  Patient presents with  . Follow-up     abdominal bloating no better    HPI: [name] is a 57 y.o. male here for f/u of recent EGD. EGD done for abdominal bloating and pain worse with meals. He had gastritis, extrinsic compression on stomach due to splenomegaly, portal gastropathy. Biopsies were benign. Extensive work-up has included abd u/s X 2, CT A/P, MRI Abd. I personally reviewed last two u/s with Dr. Thornton Papas back in 05/2013. Felt to have new splenomegaly since 11/2012 to 03-13-2013. Patient reports sister died of cirrhosis at age 72, no h/o etoh. She was diabetic.   States his heartburn controlled on prilosec. When stopped it about a week. Heartburn started back. Never tried pantoprazole because pharmacy said they did not get prescription. Complains of more lower abdominal pain feels like going to explode. No n/v. Worse after meals. Bad odor to gas. Gas build up. Probiotic didn't help. BM 3 times per day, often passes balls  Current Outpatient Prescriptions  Medication Sig Dispense Refill  . albuterol (PROVENTIL HFA;VENTOLIN HFA) 108 (90 BASE) MCG/ACT inhaler Inhale 2 puffs into the lungs every 6 (six) hours as needed. For shortness of breath      . ALPRAZolam (XANAX) 0.25 MG tablet Take 0.25 mg by mouth at bedtime as needed for sleep.      [name] aspirin EC 81 MG tablet Take 81 mg by mouth daily.      [name] atorvastatin (LIPITOR) 40 MG tablet Take 1 tablet (40 mg total) by mouth daily.  90 tablet  1  . Choline Fenofibrate (TRILIPIX) 135 MG capsule Take 1 capsule (135 mg total) by mouth daily.  90 capsule  1  . clotrimazole-betamethasone (LOTRISONE) cream Apply 1 application topically 2 (two) times daily.      [name] diltiazem (DILACOR XR) 240 MG 24 hr capsule Take 1 capsule (240 mg total) by mouth daily.  90 capsule  1  . Multiple Vitamin (MULITIVITAMIN WITH MINERALS) TABS Take 1  tablet by mouth daily.      [name] omeprazole (PRILOSEC) 20 MG capsule Take 20 mg by mouth daily.      . Probiotic Product (ALIGN) 4 MG CAPS Take 4 mg by mouth daily.  21 capsule  0  . Saxagliptin-Metformin (KOMBIGLYZE XR) 2.11-998 MG TB24 Take 2.5-1,000 mg by mouth 2 (two) times daily.      . tamsulosin (FLOMAX) 0.4 MG CAPS capsule Take 0.4 mg by mouth daily.      [name] tiotropium (SPIRIVA) 18 MCG inhalation capsule Place 18 mcg into inhaler and inhale daily.      . pantoprazole (PROTONIX) 40 MG tablet Take 1 tablet (40 mg total) by mouth daily.  30 tablet  0   No current facility-administered medications for this visit.    Allergies as of 07/06/2013 - Review Complete 07/06/2013  Allergen Reaction Noted  . Codeine Rash 07/24/2011    ROS:  General: Negative for anorexia, weight loss, fever, chills, fatigue, weakness. ENT: Negative for hoarseness, difficulty swallowing , nasal congestion. CV: Negative for chest pain, angina, palpitations, dyspnea on exertion, peripheral edema.  Respiratory: Negative for dyspnea at rest, dyspnea on exertion, cough, sputum, wheezing.  GI: See history of present illness. GU:  Negative for dysuria, hematuria, urinary incontinence, urinary frequency, nocturnal urination.  Endo: Negative for unusual weight change.    Physical Examination:   BP 122/73  Pulse 87  Temp(Src) 97.1 F (36.2 C) (Oral)  Ht 5' 7"  (1.702 m)  Wt 170 lb (77.111 kg)  BMI 26.62 kg/m2  General: Well-nourished, well-developed in no acute distress.  Eyes: No icterus. Mouth: Oropharyngeal mucosa moist and pink , no lesions erythema or exudate. Lungs: Clear to auscultation bilaterally.  Heart: Regular rate and rhythm, no murmurs rubs or gallops.  Abdomen: Bowel sounds are normal, nontender, nondistended, no hepatosplenomegaly or masses, no abdominal bruits or hernia , no rebound or guarding.   Extremities: No lower extremity edema. No clubbing or deformities. Neuro: Alert and oriented x 4     Skin: Warm and dry, no jaundice.  Darkening of skin on lower extremities, below knees Psych: Alert and cooperative, normal mood and affect.  Labs:  Lab Results  Component Value Date   LIPASE 40 04/24/2013   Lab Results  Component Value Date   ALT 58* 04/24/2013   AST 32 04/24/2013   ALKPHOS 57 04/24/2013   BILITOT 0.6 04/24/2013   Lab Results  Component Value Date   WBC 3.2* 04/24/2013   HGB 14.7 04/24/2013   HCT 42.6 04/24/2013   MCV 80.5 04/24/2013   PLT 97* 04/24/2013   Lab Results  Component Value Date   TSH 3.369 04/24/2013     Imaging Studies: No results found.

## 2013-07-06 NOTE — Assessment & Plan Note (Signed)
57 y/o male with several month h/o of abdominal bloating and discomfort worse postprandially, likely multifactorial. In part secondary to splenomegaly. Suspect constipation contributing factor as well.  Doubt significant biliary etiology. At this time, we will provide colonic purge and bowel regimen to see if symptoms improve. Need to determine etiology of splenomegaly/extent of liver involvement.   Change PPI as previously recommended by Dr. [name]. 30 day trial of pantoprazole. mag citrate today. Linzess 168mg daily. Labs.  Phazyme prn.

## 2013-07-06 NOTE — Patient Instructions (Signed)
1. Drink 1/2 to 1 bottle of magnesium citrate to cleanse your bowel today. 2. Tomorrow start Linzess one capsule on empty stomach daily. Voucher for free 30 days provided. If you have diarrhea that persists for more than 2 days, please call us. 3. Phazyme samples provided. 235m. Take 1-2 after a meal daily. Do not take more than 2 daily. You can buy over the counter, follow instructions on the bottle. 4. Stop prilosec and start protonix. Try for 30 days. If no improvement in bloating, then you may go back to prilosec daily for heartburn. 5. Please have your blood work done. If your blood work is unremarkable, we may send you to hematologist for enlarged spleen.

## 2013-07-07 NOTE — Progress Notes (Signed)
cc'd to pcp 

## 2013-07-10 ENCOUNTER — Telehealth (HOSPITAL_COMMUNITY): Payer: Self-pay | Admitting: Dietician

## 2013-07-10 NOTE — Telephone Encounter (Signed)
Called pt at 1045. Appointment scheduled for 07/15/12 at 1600.

## 2013-07-10 NOTE — Telephone Encounter (Signed)
Received referral via fax from Dr. [name] for dx: diabetes.

## 2013-07-14 LAB — IRON AND TIBC
%SAT: 22 % (ref 20–55)
Iron: 80 ug/dL (ref 42–165)
TIBC: 363 ug/dL (ref 215–435)
UIBC: 283 ug/dL (ref 125–400)

## 2013-07-14 LAB — HEPATITIS B SURFACE ANTIGEN: Hepatitis B Surface Ag: NEGATIVE

## 2013-07-14 LAB — FERRITIN: Ferritin: 267 ng/mL (ref 22–322)

## 2013-07-14 LAB — HEPATITIS C ANTIBODY: HCV Ab: NEGATIVE

## 2013-07-15 ENCOUNTER — Encounter (HOSPITAL_COMMUNITY): Payer: Self-pay | Admitting: Dietician

## 2013-07-15 NOTE — Progress Notes (Signed)
Outpatient Initial Nutrition Assessment  Date:07/15/2013   Appt Start Time: 18  Referring Physician: Dr. [name] Reason for Visit: diabetes  Nutrition Assessment:  Height: 5' 6"  (167.6 cm)   Weight: 171 lb (77.565 kg)   IBW: 142#  %IBW: 120% UBW: 157#  %UBW: 118% Body mass index is 27.61 kg/(m^2).  Meets criteria for overweight.  Goal Weight:  154# (10% loss of current wt) Weight hx: Wt Readings from Last 50 Encounters:  07/06/13 170 lb (77.111 kg)  05/15/13 175 lb (79.379 kg)  05/15/13 175 lb (79.379 kg)  04/21/13 172 lb (78.019 kg)  05/13/12 160 lb (72.576 kg)  12/19/11 158 lb (71.668 kg)  11/06/11 158 lb (71.668 kg)  08/31/11 157 lb (71.215 kg)  08/31/11 157 lb (71.215 kg)   Pt reports UBW of 140-145#, which he maintained until about 3 years ago. Reports progressive weight gain. Noted approximately 10# (6%) wt gain x  1 year.   Estimated nutritional needs:  Kcals/ day: 1500-1600 Protein (grams)/day: 62-78 Fluid (L)/ day: 1.5-1.6  PMH:  Past Medical History  Diagnosis Date  . PONV (postoperative nausea and vomiting)   . Asthma   . Dysrhythmia   . Diabetes mellitus   . GERD (gastroesophageal reflux disease)   . COPD (chronic obstructive pulmonary disease)   . Anxiety   . Depression   . Splenomegaly     Medications:  Current Outpatient Rx  Name  Route  Sig  Dispense  Refill  . albuterol (PROVENTIL HFA;VENTOLIN HFA) 108 (90 BASE) MCG/ACT inhaler   Inhalation   Inhale 2 puffs into the lungs every 6 (six) hours as needed. For shortness of breath         . ALPRAZolam (XANAX) 0.25 MG tablet   Oral   Take 0.25 mg by mouth at bedtime as needed for sleep.         [name] aspirin EC 81 MG tablet   Oral   Take 81 mg by mouth daily.         [name] atorvastatin (LIPITOR) 40 MG tablet   Oral   Take 1 tablet (40 mg total) by mouth daily.   90 tablet   1   . Choline Fenofibrate (TRILIPIX) 135 MG capsule   Oral   Take 1 capsule (135 mg total) by mouth daily.   90  capsule   1   . clotrimazole-betamethasone (LOTRISONE) cream   Topical   Apply 1 application topically 2 (two) times daily.         [name] diltiazem (DILACOR XR) 240 MG 24 hr capsule   Oral   Take 1 capsule (240 mg total) by mouth daily.   90 capsule   1   . Linaclotide (LINZESS) 145 MCG CAPS capsule   Oral   Take 1 capsule (145 mcg total) by mouth daily.   30 capsule   0   . Multiple Vitamin (MULITIVITAMIN WITH MINERALS) TABS   Oral   Take 1 tablet by mouth daily.         . pantoprazole (PROTONIX) 40 MG tablet   Oral   Take 1 tablet (40 mg total) by mouth daily.   30 tablet   1   . Probiotic Product (ALIGN) 4 MG CAPS   Oral   Take 4 mg by mouth daily.   21 capsule   0   . Saxagliptin-Metformin (KOMBIGLYZE XR) 2.11-998 MG TB24   Oral   Take 2.5-1,000 mg by mouth 2 (two) times daily.         [name]  tamsulosin (FLOMAX) 0.4 MG CAPS capsule   Oral   Take 0.4 mg by mouth daily.         [name] tiotropium (SPIRIVA) 18 MCG inhalation capsule   Inhalation   Place 18 mcg into inhaler and inhale daily.           Labs: CMP     Component Value Date/Time   NA 138 04/24/2013 0756   K 4.0 04/24/2013 0756   CL 106 04/24/2013 0756   CO2 26 04/24/2013 0756   GLUCOSE 208* 04/24/2013 0756   BUN 18 04/24/2013 0756   CREATININE 0.86 04/24/2013 0756   CREATININE 0.90 11/14/2012 0801   CALCIUM 9.0 04/24/2013 0756   PROT 7.3 04/24/2013 0756   ALBUMIN 4.4 04/24/2013 0756   AST 32 04/24/2013 0756   AST 22 06/22/2011   ALT 58* 04/24/2013 0756   ALKPHOS 57 04/24/2013 0756   ALKPHOS 41 06/22/2011   BILITOT 0.6 04/24/2013 0756   BILITOT 0.5 06/22/2011   GFRNONAA >60 02/24/2010 1340   GFRAA  Value: >60        The eGFR has been calculated using the MDRD equation. This calculation has not been validated in all clinical situations. eGFR's persistently <60 mL/min signify possible Chronic Kidney Disease. 02/24/2010 1340    Lipid Panel  No results found for this basename: chol, trig, hdl,  cholhdl, vldl, ldlcalc     No results found for this basename: HGBA1C   Lab Results  Component Value Date   CREATININE 0.86 04/24/2013    Most recent Hgb A1c readings from Dr. [name]: 04/24/13: 7.2, 02/2013: 6.8  Lifestyle/ social habits:  [name] resides in [place] with his wife, who is present with him today. He has an adult daughter who lives in [place]. He retired from [employer] this past August.  Physical activity is limited due to COPD, however, pt reports that he started walking one mile on his home treadmill daily about one month ago. He is able to tolerate this activity well and only gets SOB at the near end of the exercise.  He reports CBGs 2-3 times per day, typically twice. Fasting blood sugars range from 94-142 and postprandial blood sugars range within the 160's. He reports he will sometimes test at noon, but this is not often and he does not recall readings.   Nutrition hx/habits: [name] reports he was diagnosed with diabetes approximately 3-4 years ago. He reports good control up until a few months ago. He reports his blood sugars have been "going up" despite following the same diet and exercise regimen. He also reports his medication regimen was the same up until very recently. He reports he was just prescribed a new diabetes medication, but is unable to recall the name (he reports it has not been filled and is still using the samples). Records indicate he was most recently prescribed Invokana and janumet this past November. Records indicate he has not received diabetes education in the past.  Meal pattern is typically 3 meals per day and 1 HS snacks. He typically prepares the meals. Beverages are mostly diet [facility] and water.   Diet recall: Breakfast (0900): 2 egg sandwiches on white wheat break with tomato, coffee with sugar; Lunch (1300): cereal (cheerios) OR salad (prebagged lettuce with tomato, diet [address]; Dinner (1730): spaghetti OR BBQ  chicken OR hot dogs, diet Mountain Dew; HS snack: peanut or popcorn  Nutrition Diagnosis: Nutrition-related knowledge deficit r/t no previous DM edu AEB Hgb  A1c: 6.7.   Nutrition Intervention: Nutrition rx: 1400 kcal NAS, diabetic diet; 3 meals per day (45-60 grams carbohydrate per meal); 2-3 snacks per day 915-30 grams carbohydrates per snack); low calorie beverages only; physical activity as tolerated  Education/Counseling Provided: Educated pt on principles of diabetic diet. Discussed carbohydrate metabolism in relation to diabetes. Educated pt on basic self-management principles including: signs and symptoms of hyperglycemia and hypoglycemia, goals for fasting and postprandial blood sugars, goals for Hgb A1c, importance of checking feet, importance of keeping PCP appointments, and foot care. Educated pt on plate method, portion sizes, and sources of carbohydrate. Educated on food label reading.  Discussed importance of regular meal pattern. Discussed importance of adding sources of whole grains to diet to improve glycemic control. Also encouraged to choose low fat dairy, lean meats, and whole fruits and vegetables more often. Discussed options of artificial sweeteners and encouraged pt to use which brand she liked best. Discussed nutritional content of foods commonly eaten and discussed healthier alternatives. Discussed importance of compliance to prevent further complications of disease. Educated pt on importance of physical activity (goal of at least 30 minutes 5 times per week) along with a healthy diet to achieve weight loss and glycemic goals. Encouraged slow, moderate weight loss of 1-2# per week, or 7-10% of current body weight. Provided"Diabetes and You" and "Carbohydrate Counting and Meal Planning" handouts. Used TeachBack to assess understanding.   Understanding, Motivation, Ability to Follow Recommendations: Expect fair to good compliance.   Monitoring and Evaluation: Goals: 1) 0.5-2#  weight loss per week; 2) Hgb A1c < 7.0; 3) Fasting blood sugar 70-130, Postprandial < 180; 4) Physical activity as tolerated  Recommendations: 1) For weight loss: 1200-1400 kcals daily; 2) Continue with regular meal schedule  F/U: PRN. RD contact information provided for further questions.   [name] A. Jimmye Norman, RD, LDN 07/15/2013  Appt EndTime: 6433

## 2013-07-16 ENCOUNTER — Telehealth: Payer: Self-pay | Admitting: Gastroenterology

## 2013-07-16 NOTE — Telephone Encounter (Signed)
Reminder in [facility]

## 2013-07-16 NOTE — Telephone Encounter (Signed)
His EGD in 05/2013 showed no evidence of portal gastropathy or EV.   Refer to hematology to cover all bases for splenomegaly.   OV with Dr. [name] only approximately 2 months after his hematology appointment please.

## 2013-07-16 NOTE — Telephone Encounter (Signed)
Message copied by [name] on Thu Jul 16, 2013  9:47 AM ------      Message from: [name]      Created: Thu Jul 16, 2013  9:25 AM       Okay with hematology referral. May well have cirrhosis. EGD at some point might help sort out. We can always consider transjugular pressure measurements and liver biopsy if needed                                          ----- Message -----         From: [name], PA-C         Sent: 07/16/2013   9:18 AM           To: [name], MD, [name], PA-C            Patient with new splenomegaly. Multiple imaging studies have shown fatty liver but no overt cirrhosis. He has normal albumin. Platelets are low.             Labs for viral markers and hemochromatosis are negative.       His splenomegaly is not fully explained.             Do you think we need to consider hematology referral to determine if another cause for splenomegaly?  This guy is only 57. Sister died of cirrhosis/DM.             ------

## 2013-07-22 NOTE — Telephone Encounter (Signed)
Tried to call pt- NA 

## 2013-07-27 NOTE — Telephone Encounter (Signed)
Tried to call pt- LMOM 

## 2013-07-29 NOTE — Telephone Encounter (Signed)
Tried to call pt- LMOM 

## 2013-08-03 NOTE — Telephone Encounter (Signed)
Unable to reach pt by phone. Mailed letter

## 2013-08-05 ENCOUNTER — Other Ambulatory Visit: Payer: Self-pay | Admitting: Gastroenterology

## 2013-08-05 DIAGNOSIS — R161 Splenomegaly, not elsewhere classified: Secondary | ICD-10-CM

## 2013-08-08 DIAGNOSIS — D72819 Decreased white blood cell count, unspecified: Secondary | ICD-10-CM | POA: Insufficient documentation

## 2013-08-08 DIAGNOSIS — D696 Thrombocytopenia, unspecified: Secondary | ICD-10-CM | POA: Insufficient documentation

## 2013-08-12 ENCOUNTER — Encounter (HOSPITAL_COMMUNITY): Payer: 59

## 2013-08-12 ENCOUNTER — Encounter (HOSPITAL_COMMUNITY): Payer: Self-pay

## 2013-08-12 ENCOUNTER — Encounter (HOSPITAL_COMMUNITY): Payer: 59 | Attending: Hematology and Oncology

## 2013-08-12 VITALS — BP 133/69 | HR 93 | Temp 97.4°F | Resp 20 | Ht 67.0 in | Wt 171.1 lb

## 2013-08-12 DIAGNOSIS — R140 Abdominal distension (gaseous): Secondary | ICD-10-CM

## 2013-08-12 DIAGNOSIS — K219 Gastro-esophageal reflux disease without esophagitis: Secondary | ICD-10-CM | POA: Insufficient documentation

## 2013-08-12 DIAGNOSIS — F411 Generalized anxiety disorder: Secondary | ICD-10-CM | POA: Insufficient documentation

## 2013-08-12 DIAGNOSIS — R143 Flatulence: Secondary | ICD-10-CM

## 2013-08-12 DIAGNOSIS — K560 Paralytic ileus: Secondary | ICD-10-CM | POA: Insufficient documentation

## 2013-08-12 DIAGNOSIS — F3289 Other specified depressive episodes: Secondary | ICD-10-CM | POA: Insufficient documentation

## 2013-08-12 DIAGNOSIS — J45909 Unspecified asthma, uncomplicated: Secondary | ICD-10-CM | POA: Insufficient documentation

## 2013-08-12 DIAGNOSIS — R142 Eructation: Secondary | ICD-10-CM

## 2013-08-12 DIAGNOSIS — D72819 Decreased white blood cell count, unspecified: Secondary | ICD-10-CM

## 2013-08-12 DIAGNOSIS — Z7982 Long term (current) use of aspirin: Secondary | ICD-10-CM | POA: Insufficient documentation

## 2013-08-12 DIAGNOSIS — R141 Gas pain: Secondary | ICD-10-CM

## 2013-08-12 DIAGNOSIS — K760 Fatty (change of) liver, not elsewhere classified: Secondary | ICD-10-CM

## 2013-08-12 DIAGNOSIS — K7689 Other specified diseases of liver: Secondary | ICD-10-CM | POA: Insufficient documentation

## 2013-08-12 DIAGNOSIS — K259 Gastric ulcer, unspecified as acute or chronic, without hemorrhage or perforation: Secondary | ICD-10-CM | POA: Insufficient documentation

## 2013-08-12 DIAGNOSIS — R161 Splenomegaly, not elsewhere classified: Secondary | ICD-10-CM

## 2013-08-12 DIAGNOSIS — E119 Type 2 diabetes mellitus without complications: Secondary | ICD-10-CM | POA: Insufficient documentation

## 2013-08-12 DIAGNOSIS — D696 Thrombocytopenia, unspecified: Secondary | ICD-10-CM

## 2013-08-12 DIAGNOSIS — I499 Cardiac arrhythmia, unspecified: Secondary | ICD-10-CM | POA: Insufficient documentation

## 2013-08-12 DIAGNOSIS — F329 Major depressive disorder, single episode, unspecified: Secondary | ICD-10-CM | POA: Insufficient documentation

## 2013-08-12 LAB — COMPREHENSIVE METABOLIC PANEL
ALT: 44 U/L (ref 0–53)
AST: 31 U/L (ref 0–37)
Albumin: 4.4 g/dL (ref 3.5–5.2)
Alkaline Phosphatase: 59 U/L (ref 39–117)
BUN: 16 mg/dL (ref 6–23)
CALCIUM: 9.9 mg/dL (ref 8.4–10.5)
CHLORIDE: 103 meq/L (ref 96–112)
CO2: 22 mEq/L (ref 19–32)
CREATININE: 0.95 mg/dL (ref 0.50–1.35)
GFR calc Af Amer: >90 mL/min (ref >90)
GFR calc non Af Amer: >90 mL/min (ref >90)
Glucose, Bld: 87 mg/dL (ref 70–99)
Potassium: 3.8 mEq/L (ref 3.7–5.3)
Sodium: 141 mEq/L (ref 137–147)
Total Bilirubin: 0.4 mg/dL (ref 0.3–1.2)
Total Protein: 8.6 g/dL — ABNORMAL HIGH (ref 6.0–8.3)

## 2013-08-12 LAB — CBC WITH DIFFERENTIAL/PLATELET
BASOS PCT: 0 % (ref 0–1)
Basophils Absolute: 0 10*3/uL (ref 0.0–0.1)
EOS PCT: 8 % — AB (ref 0–5)
Eosinophils Absolute: 0.4 10*3/uL (ref 0.0–0.7)
HEMATOCRIT: 44.9 % (ref 39.0–52.0)
HEMOGLOBIN: 14.8 g/dL (ref 13.0–17.0)
Lymphocytes Relative: 25 % (ref 12–46)
Lymphs Abs: 1.1 10*3/uL (ref 0.7–4.0)
MCH: 27.9 pg (ref 26.0–34.0)
MCHC: 33 g/dL (ref 30.0–36.0)
MCV: 84.6 fL (ref 78.0–100.0)
MONO ABS: 0.3 10*3/uL (ref 0.1–1.0)
MONOS PCT: 7 % (ref 3–12)
Neutro Abs: 2.6 10*3/uL (ref 1.7–7.7)
Neutrophils Relative %: 59 % (ref 43–77)
Platelets: 124 10*3/uL — ABNORMAL LOW (ref 150–400)
RBC: 5.31 MIL/uL (ref 4.22–5.81)
RDW: 13.6 % (ref 11.5–15.5)
WBC: 4.5 10*3/uL (ref 4.0–10.5)

## 2013-08-12 LAB — RETICULOCYTES
RBC.: 5.31 MIL/uL (ref 4.22–5.81)
RETIC COUNT ABSOLUTE: 79.7 10*3/uL (ref 19.0–186.0)
Retic Ct Pct: 1.5 % (ref 0.4–3.1)

## 2013-08-12 MED ORDER — METOCLOPRAMIDE HCL 5 MG PO TABS: ORAL_TABLET | ORAL | Status: DC

## 2013-08-12 NOTE — Progress Notes (Signed)
[place] A. Barnet Glasgow, M.D.  NEW PATIENT EVALUATION   Name: [name] Date: 08/12/2013 MRN: [id number] DOB: 26-Jul-1955  PCP: Alonza Bogus, MD   REFERRING PHYSICIAN: Alonza Bogus, MD  REASON FOR REFERRAL: Splenomegaly with leukopenia and thrombocytopenia documented on 04/24/2013    HISTORY OF PRESENT ILLNESS:[name] is a 58 y.o. male who is referred by his family physician for splenomegaly diagnosed on ultrasound on 03/11/2013 at which time gallbladder sludge was also found. He has a history of NASH with fatty liver documented on CT scan done on 05/28/2014 at which time spleen was compared to its appearance on MRI done in May of 2014 where no mention of the spleen was made! Since may of 2014 he has had abdominal bloating. He continues to eat CABG in [employer]. He broke is no twice in the past and has been reviewed with mouth for many years. Fasting blood sugars in the 142 mg percent range. He was tried on Zinsess but had severe diarrhea and only takes the medication. He denies any fever or chills and has had no episodes of sore throat in May. He denies any dysuria, but does have occasional sharp abdominal pain without nausea or vomiting but with plenty of belching and flatulence. He denies dysuria, hematuria, fever, night sweats, lower extremity swelling or redness, cough, wheezing, skin rash, joint pain, headache, or seizures.   PAST MEDICAL HISTORY:  has a past medical history of PONV (postoperative nausea and vomiting); Asthma; Dysrhythmia; Diabetes mellitus; GERD (gastroesophageal reflux disease); COPD (chronic obstructive pulmonary disease); Anxiety; Depression; and Splenomegaly.     PAST SURGICAL HISTORY: Past Surgical History  Procedure Laterality Date  . Hernia repair      2010, september  . Vasectomy  1989  . Colonoscopy  08/31/2011    [id number] rectal and colon polyps-treated. Single tubular adenoma. Next TCS  08/2016  . Esophagogastroduodenoscopy (egd) with esophageal dilation N/A 05/15/2013    BLT:[id number] esophagus-status post Venia Minks dilation/Portal gastropathy. Antral erosions-status post biopsy. extrinsic compression along lesser curvature likely secondary to splenomegaly. gastric bx benign     CURRENT MEDICATIONS: has a current medication list which includes the following prescription(s): albuterol, alprazolam, aspirin ec, atorvastatin, choline fenofibrate, clotrimazole-betamethasone, diltiazem, hydrocodone-acetaminophen, linaclotide, multivitamin with minerals, pantoprazole, saxagliptin-metformin, simethicone, tamsulosin, tiotropium, metoclopramide, and align.   ALLERGIES: Codeine   SOCIAL HISTORY:  reports that he has quit smoking. He does not have any smokeless tobacco history on file. He reports that he does not drink alcohol or use illicit drugs.   FAMILY HISTORY: family history includes Arthritis in an other family member; Asthma in an other family member; Cancer in his mother; Diabetes in an other family member; Heart disease in an other family member; Kidney disease in an other family member; Lung disease in an other family member; Ovarian cancer in his sister. There is no history of Colon cancer.    REVIEW OF SYSTEMS:  Other than that discussed above is noncontributory.    PHYSICAL EXAM:  height is 5' 7"  (1.702 m) and weight is 171 lb 1.6 oz (77.61 kg). His oral temperature is 97.4 F (36.3 C). His blood pressure is 133/69 and his pulse is 93. His respiration is 20.    GENERAL:alert, no distress and comfortable SKIN: skin color, texture, turgor are normal, no rashes or significant lesions EYES: normal, Conjunctiva are pink and non-injected, sclera clear NOSE: Evidence of previous nasal bone fracture. No epistaxis. OROPHARYNX:no  exudate, no erythema and lips, buccal mucosa, and tongue normal  NECK: supple, thyroid normal size, non-tender, without nodularity CHEST: Normal AP  diameter with no gynecomastia. LYMPH:  no palpable lymphadenopathy in the cervical, axillary or inguinal LUNGS: clear to auscultation and percussion with normal breathing effort HEART: regular rate & rhythm and no murmurs ABDOMEN: Grossly distended and tympanitic. No rebound tenderness. Internal organs could not be appreciated. MUSCULOSKELETALl:no cyanosis of digits, no clubbing or edema  NEURO: alert & oriented x 3 with fluent speech, no focal motor/sensory deficits    LABORATORY DATA:  No visits with results within 30 Day(s) from this visit. Latest known visit with results is:  Office Visit on 07/06/2013  Component Date Value Range Status  . Hepatitis B Surface Ag 07/14/2013 NEGATIVE  NEGATIVE Final  . HCV Ab 07/14/2013 NEGATIVE  NEGATIVE Final  . Iron 07/14/2013 80  42 - 165 ug/dL Final  . UIBC 07/14/2013 283  125 - 400 ug/dL Final  . TIBC 07/14/2013 363  215 - 435 ug/dL Final  . %SAT 07/14/2013 22  20 - 55 % Final  . Ferritin 07/14/2013 267  22 - 322 ng/mL Final    Urinalysis No results found for this basename: colorurine,  appearanceur,  labspec,  phurine,  glucoseu,  hgbur,  bilirubinur,  ketonesur,  proteinur,  urobilinogen,  nitrite,  leukocytesur      @RADIOGRAPHY :   US Abdomen Complete 11/07/2012  Status: Final result         PACS Images    Show images for US Abdomen Complete         Study Result    *RADIOLOGY REPORT*  Clinical Data: Right upper quadrant pain with nausea and vomiting  worse after eating a spicy foods. Diabetes with  hypercholesterolemia and history of kidney stones  COMPLETE ABDOMINAL ULTRASOUND 11/07/2012 Comparison: None.  Findings:  Gallbladder: The gallbladder is well distended and shows no  evidence for intraluminal stones. A small amount of mobile sludge  is identified. No gallbladder wall thickening or pericholecystic  fluid is seen. Evaluation for a sonographic Murphy's sign is  negative.  Common bile duct: Measures 3.8 mm in  diameter and has a normal  morphology  Liver: Demonstrates a mild increase in echotexture diffusely most  suggestive of mild hepatic steatosis. No focal parenchymal  abnormality or signs of intrahepatic ductal dilatation are seen  IVC: The proximal portion appears normal  Pancreas: The pancreatic tail is obscured by overlying gas. The  head and body have a normal size and echotexture  Spleen: Appears homogeneous in echotexture with a sagittal length  of 11.7 cm.  Right Kidney: Demonstrates a sagittal length of 11.1 cm. No focal  parenchymal abnormalities or signs of hydronephrosis are seen  Left Kidney: Demonstrates a sagittal length of 10.6 cm. Two  echogenic foci identified within the mid pole of the kidney which  demonstrate posterior acoustical shadowing compatible with kidney  stones. Both have a length of 5 mm. Hypoechoic area in the mid pole  of the left kidney has several nodular echogenic foci around the  periphery suspicious for nodular calcifications. This measures 1.6  x 2.1 cm and would be considered an indeterminate lesion (Bosniak  IIF). A thin-walled unilocular cyst is seen in the mid pole  measuring 1.6 x 1.7 x 0.8 cm. A smaller thin walled cyst is seen  in the mid pole measuring 1 x 1 x 0.9 cm.  Abdominal aorta: Has a maximal caliber of 2.7 cm with no  aneurysmal dilatation noted  IMPRESSION:  Gallbladder sludge with no signs of associated cholecystitis.  Findings suspicious for mild hepatic steatosis.  Left renal calculi.  Indeterminate cystic lesion in the mid pole of the left kidney  demonstrating possible nodular peripheral calcification. Further  evaluation with abdominal MRI with contrast would be recommended  for complete characterization. Other benign appearing cysts are  also noted in the left kidney.  Original Report Authenticated By: Ponciano Ort, M.D.     US Abdomen Complete Status: Final result         PACS Images    Show images for US  Abdomen Complete         Study Result    *RADIOLOGY REPORT*  Clinical Data: Bloating.  ABDOMINAL ULTRASOUND COMPLETE   03/11/2013 Comparison: MRI 11/14/2012  Findings:  Gallbladder: Sludge noted within the lumen of the gallbladder. No  gallbladder wall thickening or pericholecystic fluid. Negative  sonographic Murphy's sign.  Common Bile Duct: Within normal limits measuring 5 mm.  Liver: The liver appears diffusely echogenic compatible with fatty  infiltration. Within normal limits in parenchymal echogenicity.  IVC: Appears normal.  Pancreas: Not visualize due to overlying bowel gas.  Spleen: The spleen measures 13.9 cm in length. The volume is  equal to 632 ml.  Right kidney: Normal in size and parenchymal echogenicity. No  evidence of mass or hydronephrosis.  Left kidney: Normal in size and parenchymal echogenicity. No  evidence of mass or hydronephrosis. Multiple cysts are noted in  the left kidney. These were described on recent MRI from 11/14/2012  as representing Bosniak category type 1 and type 2.  Abdominal Aorta: No aneurysm identified.  IMPRESSION:  1. Splenomegaly.  2. Gallbladder sludge.  3. Fatty infiltration of the liver.  Original Report Authenticated                Show images for US Abdomen Complete         Study Result      MR Abdomen W Wo Contrast Status: Final result  11/14/2012       PACS Images    Show images for MR Abdomen W Wo Contrast         Study Result    *RADIOLOGY REPORT*  Clinical Data: Left kidney abnormalities seen on recent ultrasound  exam.  MRI ABDOMEN WITH AND WITHOUT CONTRAST 11/14/2012 Technique: Multiplanar multisequence MR imaging of the abdomen was  performed both before and after administration of intravenous  contrast.  Contrast: 35m MULTIHANCE GADOBENATE DIMEGLUMINE 529 MG/ML IV SOLN  Comparison: Ultrasound exam from 11/07/2012  Findings: Multiple tiny bilateral simple cortical cysts are seen in    the kidneys. Specific attention to the left kidney shows two  adjacent cysts, each measuring about 1 cm. These demonstrate  homogeneous high signal intensity on T1-weighted imaging and are  intermediate signal intensity on T2-weighted imaging. Neither of  these two tiny lesions demonstrate enhancement after IV contrast  administration. They are compatible with cysts complicated by  proteinaceous debris or hemorrhage. A third cyst in the interpolar  left kidney measures 2.0 x 1.5 cm. This corresponds to the Bosniak  IIF lesion seen on the recent ultrasound exam. On MR, the lesion  is seen to be homogeneous without internal septation or mural  nodularity. There is no associated enhancement within this nodule.  No abdominal aortic aneurysm. No free fluid or lymphadenopathy in  the abdomen. Tiny cyst or pseudocyst noted in the spleen. The  stomach, duodenum, pancreas, gallbladder, adrenal  glands, and  abdominal bowel loops have normal MR imaging features.  The visualized portions of the skeleton show no abnormal marrow  signal.  IMPRESSION:  The indeterminate cystic lesion seen on recent ultrasound exam is  shown by MRI to represent a 2.0 x 1.5 cm simple cyst in the left  kidney. This is a Bosniak I category and requires no further  imaging follow-up.  A pair of adjacent 1 cm cysts complicated by proteinaceous  debris/hemorrhage is noted in the interpolar left kidney. These  are classified in the Bosniak II category, consistent with benign  findings.  Tiny simple cysts are seen in the cortex of each kidney.  Original Report Authenticated     CT Abdomen Pelvis W Contrast Status: Final result         PACS Images    Show images for CT Abdomen Pelvis W Contrast         Study Result    CLINICAL DATA: Chronic abdominal pain and bloating  EXAM:  CT ABDOMEN AND PELVIS WITH CONTRAST 05/28/2013 TECHNIQUE:  Multidetector CT imaging of the abdomen and pelvis was performed  using  the standard protocol following bolus administration of  intravenous contrast.  CONTRAST: 116m OMNIPAQUE IOHEXOL 300 MG/ML SOLN  COMPARISON: MR abdomen 11/14/2012  FINDINGS:  Lung bases are clear. No pericardial fluid. Diffuse fatty  infiltration of the liver. The gallbladder, pancreas, spleen,  adrenal glands, and kidneys are unchanged from comparison MRI. There  is again demonstrated multiple cystic lesions within the kidneys.  These are demonstrated to be benign simple and hemorrhagic cyst on  comparison MRI.  The stomach, small bowel, and cecum are normal. Appendix not  identified. No secondary signs of appendicitis. Query partial  appendectomy. Moderate volume of stool in the ascending, transverse,  and descending colon. There several diverticula of the sigmoid  region. The rectum is normal.  Abdominal or is normal caliber. No retroperitoneal periportal  lymphadenopathy. . No free fluid the pelvis. Prostate gland and  bladder normal. There is a fat filled right inguinal hernia. No  pelvic lymphadenopathy. Review of bone windows demonstrates no  aggressive osseous lesions. .  IMPRESSION:  1. No acute abdominal or pelvic findings.  2. Hepatic steatosis.  3. Stable benign renal cysts.  4. Mild sigmoid diverticulosis without diverticulitis.  Electronically Signed  By: SSuzy BouchardM.D.  On: 05/28/2013 18:      PATHOLOGY:   for MKUMAR, FALWELL(([id number] Patient: MDARRYLL, RAJUCollected: 05/15/2013 [facility] [id number]: 05/15/2013 RManus RuddDOB: [birth year]Age: [age]Gender: M Reported: 05/18/2013 [address] Patient Ph: [phone] #: [id number] HoffNC [id number] #: [id number] #: Phone: [phone]: CC: REPORT OF SURGICAL PATHOLOGY FINAL DIAGNOSIS Diagnosis Stomach, biopsy - ULCERATED GASTRIS BODY TYPE MUCOSA WITH REACTIVE CHANGES. - NO EVIDENCE OF HELICOBACTER PYLORI, INTESTINAL METAPLASIA,  DYSPLASIA OR MALIGNANCY. Microscopic Comment A Warthin-Starry stain is performed to determine the possibility of the presence of Helicobacter pylori. The Warthin-Starry stain is negative for organisms of Helicobacter pylori. (HCL:caf 05/18/13) HAldona BarMD Pathologist, Electronic Signature (Case signed 05/18/2013) Specimen Gross and Clinical Information Specimen(s) Obtained: Stomach, biopsy Specimen Clinical Information Pre-op: abdominal pain and bloating and dysphagia; Post-op: gastric erosions Gross Received in formalin are tan, soft tissue fragments that are submitted in toto. Number: two Size: 0.1 and 0.5 cm. (KL:ecj 05/15/2013) Stain(s) used in Diagnosis: The following stain(s) were used in diagnosing the case: Warthin-Starry Stain. The control(s) stained appropriately. Report  signed out from the following location(s) Technical Component performed at [facility].[place], [place] [address] CLIA: [id number]., 1 of 2 FINAL for HATIM, HOMANN ([id number]) Report signed out from the following location(s)(continued) Technical Component performed at [facility].[place], [place], [place] [address]. CLIA #: [id number], Interpretation performed at [facility].[place], [place], [place] [address]. CLIA #: [id number],  IMPRESSION:  #1. Transient splenomegaly possibly due to viral infection without associated lymphadenopathy.. #2. Leukopenia, long-standing according to the patient, now with thrombocytopenia as well, mild. Possibly related to a viral infection. #3. Intestinal paresis. #4. Fractured nose with increased air swallowing. #5. Gastric ulcerations, on proton pump inhibitors #6. Nonalcoholic steatohepatitis by ultrasound. #7. Gastroesophageal reflux disease. #8. Diabetes mellitus, type II, non-insulin requiring.   PLAN:  #1. Trial of Reglan 5-10 mg 4 times a day before meals and bedtime. #2. Viral  studies. #3. Avoid eating cabbage and beans. #4. Meticulous control of blood sugar. #5. Followup in 2 weeks.   Doroteo Bradford, MD 08/12/2013 4:07 PM

## 2013-08-12 NOTE — Patient Instructions (Signed)
[facility] Discharge Instructions  RECOMMENDATIONS MADE BY THE CONSULTANT AND ANY TEST RESULTS WILL BE SENT TO YOUR REFERRING PHYSICIAN.  EXAM FINDINGS BY THE PHYSICIAN TODAY AND SIGNS OR SYMPTOMS TO REPORT TO CLINIC OR PRIMARY PHYSICIAN:   Return in 2 weeks to see Dr. Barnet Glasgow.          (Thursday Aug 27, 2013 @ 8:30)  We are doing labs today. It may take a couple of days for all the labs to come back. You can call on Friday to get results if you would like. [phone] and ask for a nurse.  We are prescribing Reglan (metoclopramide) for the gastroparesis. This will help your stomach empty more quickly.    Thank you for choosing [facility] to provide your oncology and hematology care.  To afford each patient quality time with our providers, please arrive at least 15 minutes before your scheduled appointment time.  With your help, our goal is to use those 15 minutes to complete the necessary work-up to ensure our physicians have the information they need to help with your evaluation and healthcare recommendations.    Effective January 1st, 2014, we ask that you re-schedule your appointment with our physicians should you arrive 10 or more minutes late for your appointment.  We strive to give you quality time with our providers, and arriving late affects you and other patients whose appointments are after yours.    Again, thank you for choosing [facility].  Our hope is that these requests will decrease the amount of time that you wait before being seen by our physicians.       _____________________________________________________________  Should you have questions after your visit to [facility], please contact our office at [phone] between the hours of 8:30 a.m. and 5:00 p.m.  Voicemails left after 4:30 p.m. will not be returned until the following business day.  For prescription refill requests, have your pharmacy contact our  office with your prescription refill request.    Metoclopramide tablets What is this medicine? METOCLOPRAMIDE (met oh kloe PRA mide) is used to treat the symptoms of gastroesophageal reflux disease (GERD) like heartburn. It is also used to treat people with slow emptying of the stomach and intestinal tract. This medicine may be used for other purposes; ask your health care provider or pharmacist if you have questions. COMMON BRAND NAME(S): Reglan What should I tell my health care provider before I take this medicine? They need to know if you have any of these conditions: -breast cancer -depression -diabetes -heart failure -high blood pressure -kidney disease -liver disease -Parkinson's disease or a movement disorder -pheochromocytoma -seizures -stomach obstruction, bleeding, or perforation -an unusual or allergic reaction to metoclopramide, procainamide, sulfites, other medicines, foods, dyes, or preservatives -pregnant or trying to get pregnant -breast-feeding How should I use this medicine? Take this medicine by mouth with a glass of water. Follow the directions on the prescription label. Take this medicine on an empty stomach, about 30 minutes before eating. Take your doses at regular intervals. Do not take your medicine more often than directed. Do not stop taking except on the advice of your doctor or health care professional. A special MedGuide will be given to you by the pharmacist with each prescription and refill. Be sure to read this information carefully each time. Talk to your pediatrician regarding the use of this medicine in children. Special care may be needed. Overdosage: If you think you  have taken too much of this medicine contact a poison control center or emergency room at once. NOTE: This medicine is only for you. Do not share this medicine with others. What if I miss a dose? If you miss a dose, take it as soon as you can. If it is almost time for your next dose,  take only that dose. Do not take double or extra doses. What may interact with this medicine? -acetaminophen -cyclosporine -digoxin -medicines for blood pressure -medicines for diabetes, including insulin -medicines for hay fever and other allergies -medicines for depression, especially an Monoamine Oxidase Inhibitor (MAOI) -medicines for Parkinson's disease, like levodopa -medicines for sleep or for pain -tetracycline This list may not describe all possible interactions. Give your health care provider a list of all the medicines, herbs, non-prescription drugs, or dietary supplements you use. Also tell them if you smoke, drink alcohol, or use illegal drugs. Some items may interact with your medicine. What should I watch for while using this medicine? It may take a few weeks for your stomach condition to start to get better. However, do not take this medicine for longer than 12 weeks. The longer you take this medicine, and the more you take it, the greater your chances are of developing serious side effects. If you are an elderly patient, a male patient, or you have diabetes, you may be at an increased risk for side effects from this medicine. Contact your doctor immediately if you start having movements you cannot control such as lip smacking, rapid movements of the tongue, involuntary or uncontrollable movements of the eyes, head, arms and legs, or muscle twitches and spasms. Patients and their families should watch out for worsening depression or thoughts of suicide. Also watch out for any sudden or severe changes in feelings such as feeling anxious, agitated, panicky, irritable, hostile, aggressive, impulsive, severely restless, overly excited and hyperactive, or not being able to sleep. If this happens, especially at the beginning of treatment or after a change in dose, call your doctor. Do not treat yourself for high fever. Ask your doctor or health care professional for advice. You may get  drowsy or dizzy. Do not drive, use machinery, or do anything that needs mental alertness until you know how this drug affects you. Do not stand or sit up quickly, especially if you are an older patient. This reduces the risk of dizzy or fainting spells. Alcohol can make you more drowsy and dizzy. Avoid alcoholic drinks. What side effects may I notice from receiving this medicine? Side effects that you should report to your doctor or health care professional as soon as possible: -allergic reactions like skin rash, itching or hives, swelling of the face, lips, or tongue -abnormal production of milk in females -breast enlargement in both males and females -change in the way you walk -difficulty moving, speaking or swallowing -drooling, lip smacking, or rapid movements of the tongue -excessive sweating -fever -involuntary or uncontrollable movements of the eyes, head, arms and legs -irregular heartbeat or palpitations -muscle twitches and spasms -unusually weak or tired Side effects that usually do not require medical attention (report to your doctor or health care professional if they continue or are bothersome): -change in sex drive or performance -depressed mood -diarrhea -difficulty sleeping -headache -menstrual changes -restless or nervous This list may not describe all possible side effects. Call your doctor for medical advice about side effects. You may report side effects to FDA at 1-800-FDA-1088. Where should I keep my medicine? Keep  out of the reach of children. Store at room temperature between 20 and 25 degrees C (68 and 77 degrees F). Protect from light. Keep container tightly closed. Throw away any unused medicine after the expiration date. NOTE: This sheet is a summary. It may not cover all possible information. If you have questions about this medicine, talk to your doctor, pharmacist, or health care provider.  2014, Elsevier/Gold Standard. (2011-10-23 13:04:38)

## 2013-08-13 LAB — EPSTEIN-BARR VIRUS VCA ANTIBODY PANEL
EBV EA IgG: 17.3 U/mL — ABNORMAL HIGH (ref <9.0)
EBV NA IGG: 70.7 U/mL — AB (ref <18.0)
EBV VCA IgG: 656 U/mL — ABNORMAL HIGH (ref <18.0)

## 2013-08-13 LAB — CMV IGM: CMV IgM: <8 AU/mL (ref <30.00)

## 2013-08-13 LAB — FOLATE: Folate: >20 ng/mL

## 2013-08-13 LAB — VITAMIN B12: VITAMIN B 12: 694 pg/mL (ref 211–911)

## 2013-08-13 LAB — CMV ANTIBODY, IGG (EIA): CMV Ab - IgG: >10 U/mL — ABNORMAL HIGH (ref <0.60)

## 2013-08-27 ENCOUNTER — Encounter (HOSPITAL_COMMUNITY): Payer: Self-pay

## 2013-08-27 ENCOUNTER — Encounter (HOSPITAL_BASED_OUTPATIENT_CLINIC_OR_DEPARTMENT_OTHER): Payer: 59

## 2013-08-27 ENCOUNTER — Other Ambulatory Visit: Payer: Self-pay

## 2013-08-27 VITALS — BP 120/69 | HR 80 | Temp 97.4°F | Resp 18 | Wt 171.5 lb

## 2013-08-27 DIAGNOSIS — R161 Splenomegaly, not elsewhere classified: Secondary | ICD-10-CM

## 2013-08-27 DIAGNOSIS — D72819 Decreased white blood cell count, unspecified: Secondary | ICD-10-CM

## 2013-08-27 DIAGNOSIS — E119 Type 2 diabetes mellitus without complications: Secondary | ICD-10-CM

## 2013-08-27 DIAGNOSIS — D696 Thrombocytopenia, unspecified: Secondary | ICD-10-CM

## 2013-08-27 DIAGNOSIS — K560 Paralytic ileus: Secondary | ICD-10-CM

## 2013-08-27 DIAGNOSIS — K7689 Other specified diseases of liver: Secondary | ICD-10-CM

## 2013-08-27 MED ORDER — METOCLOPRAMIDE HCL 10 MG PO TABS: 10.0000 mg | ORAL_TABLET | Freq: Three times a day (TID) | ORAL | Status: DC

## 2013-08-27 MED ORDER — PANTOPRAZOLE SODIUM 40 MG PO TBEC: 40.0000 mg | DELAYED_RELEASE_TABLET | Freq: Every day | ORAL | Status: DC

## 2013-08-27 NOTE — Progress Notes (Signed)
[facility]  OFFICE PROGRESS NOTE  Alonza Bogus, MD [place] [address]  DIAGNOSIS: Leukopenia - Plan: CBC with Differential, metoCLOPramide (REGLAN) 10 MG tablet, Comprehensive metabolic panel, Lactate dehydrogenase  Thrombocytopenia, unspecified - Plan: CBC with Differential, metoCLOPramide (REGLAN) 10 MG tablet, Comprehensive metabolic panel, Lactate dehydrogenase  Chief Complaint  Patient presents with  . Follow-up  . Splenomegaly  . Intestinal paresis  . Thrombocytopenia    CURRENT THERAPY: Metoclopramide  INTERVAL HISTORY: [name] 58 y.o. male returns for followup of splenomegaly with mild thrombocytopenia in the setting of diabetes and intestinal paresis. Bloating has diminished by about 50%. Patient denies any fever, night sweats, sore throat, easy satiety, lower extremity swelling or redness, PND, orthopnea, palpitations, skin rash, headache, or seizures.  MEDICAL HISTORY: Past Medical History  Diagnosis Date  . PONV (postoperative nausea and vomiting)   . Asthma   . Dysrhythmia   . Diabetes mellitus   . GERD (gastroesophageal reflux disease)   . COPD (chronic obstructive pulmonary disease)   . Anxiety   . Depression   . Splenomegaly     INTERIM HISTORY: has Bilateral leg pain; Back pain with radiation; DDD (degenerative disc disease), lumbar; Lumbago; Muscle weakness (generalized); Abdominal bloating; Abdominal pain, generalized; Non-alcoholic fatty liver disease; Splenomegaly; Lower abdominal pain; Abnormal LFTs; GERD (gastroesophageal reflux disease); Unspecified constipation; Leukopenia; and Thrombocytopenia, unspecified on his problem list.    ALLERGIES:  is allergic to codeine.  MEDICATIONS: has a current medication list which includes the following prescription(s): albuterol, alprazolam, aspirin ec, atorvastatin, choline fenofibrate, clotrimazole-betamethasone, diltiazem,  hydrocodone-acetaminophen, linaclotide, metoclopramide, multivitamin with minerals, pantoprazole, align, saxagliptin-metformin, simethicone, tamsulosin, tiotropium, and metoclopramide.  SURGICAL HISTORY:  Past Surgical History  Procedure Laterality Date  . Hernia repair      2010, september  . Vasectomy  1989  . Colonoscopy  08/31/2011    AVW:PVXYIAXK rectal and colon polyps-treated. Single tubular adenoma. Next TCS 08/2016  . Esophagogastroduodenoscopy (egd) with esophageal dilation N/A 05/15/2013    PVV:ZSMOLM esophagus-status post Venia Minks dilation/Portal gastropathy. Antral erosions-status post biopsy. extrinsic compression along lesser curvature likely secondary to splenomegaly. gastric bx benign    FAMILY HISTORY: family history includes Arthritis in an other family member; Asthma in an other family member; Cancer in his mother; Diabetes in an other family member; Heart disease in an other family member; Kidney disease in an other family member; Lung disease in an other family member; Ovarian cancer in his sister. There is no history of Colon cancer.  SOCIAL HISTORY:  reports that he has quit smoking. He has quit using smokeless tobacco. His smokeless tobacco use included Chew. He reports that he does not drink alcohol or use illicit drugs.  REVIEW OF SYSTEMS:  Other than that discussed above is noncontributory.  PHYSICAL EXAMINATION: ECOG PERFORMANCE STATUS: 1 - Symptomatic but completely ambulatory  Blood pressure 120/69, pulse 80, temperature 97.4 F (36.3 C), temperature source Oral, resp. rate 18, weight 171 lb 8 oz (77.792 kg).  GENERAL:alert, no distress and comfortable SKIN: skin color, texture, turgor are normal, no rashes or significant lesions EYES: PERLA; Conjunctiva are pink and non-injected, sclera clear OROPHARYNX:no exudate, no erythema on lips, buccal mucosa, or tongue. NECK: supple, thyroid normal size, non-tender, without nodularity. No masses CHEST: Normal AP  diameter with no breast masses. LYMPH:  no palpable lymphadenopathy in the cervical, axillary or inguinal LUNGS: clear to auscultation and percussion with normal breathing effort  HEART: regular rate & rhythm and no murmurs. ABDOMEN: Slightly distended and tympanitic but markedly less so than on previous examination. Spleen tip is not palpable. MUSCULOSKELETAL:no cyanosis of digits and no clubbing. Range of motion normal.  NEURO: alert & oriented x 3 with fluent speech, no focal motor/sensory deficits   LABORATORY DATA: Office Visit on 08/12/2013  Component Date Value Ref Range Status  . WBC 08/12/2013 4.5  4.0 - 10.5 K/uL Final  . RBC 08/12/2013 5.31  4.22 - 5.81 MIL/uL Final  . Hemoglobin 08/12/2013 14.8  13.0 - 17.0 g/dL Final  . HCT 08/12/2013 44.9  39.0 - 52.0 % Final  . MCV 08/12/2013 84.6  78.0 - 100.0 fL Final  . MCH 08/12/2013 27.9  26.0 - 34.0 pg Final  . MCHC 08/12/2013 33.0  30.0 - 36.0 g/dL Final  . RDW 08/12/2013 13.6  11.5 - 15.5 % Final  . Platelets 08/12/2013 124* 150 - 400 K/uL Final  . Neutrophils Relative % 08/12/2013 59  43 - 77 % Final  . Neutro Abs 08/12/2013 2.6  1.7 - 7.7 K/uL Final  . Lymphocytes Relative 08/12/2013 25  12 - 46 % Final  . Lymphs Abs 08/12/2013 1.1  0.7 - 4.0 K/uL Final  . Monocytes Relative 08/12/2013 7  3 - 12 % Final  . Monocytes Absolute 08/12/2013 0.3  0.1 - 1.0 K/uL Final  . Eosinophils Relative 08/12/2013 8* 0 - 5 % Final  . Eosinophils Absolute 08/12/2013 0.4  0.0 - 0.7 K/uL Final  . Basophils Relative 08/12/2013 0  0 - 1 % Final  . Basophils Absolute 08/12/2013 0.0  0.0 - 0.1 K/uL Final  . Retic Ct Pct 08/12/2013 1.5  0.4 - 3.1 % Final  . RBC. 08/12/2013 5.31  4.22 - 5.81 MIL/uL Final  . Retic Count, Manual 08/12/2013 79.7  19.0 - 186.0 K/uL Final  . Sodium 08/12/2013 141  137 - 147 mEq/L Final  . Potassium 08/12/2013 3.8  3.7 - 5.3 mEq/L Final  . Chloride 08/12/2013 103  96 - 112 mEq/L Final  . CO2 08/12/2013 22  19 - 32 mEq/L  Final  . Glucose, Bld 08/12/2013 87  70 - 99 mg/dL Final  . BUN 08/12/2013 16  6 - 23 mg/dL Final  . Creatinine, Ser 08/12/2013 0.95  0.50 - 1.35 mg/dL Final  . Calcium 08/12/2013 9.9  8.4 - 10.5 mg/dL Final  . Total Protein 08/12/2013 8.6* 6.0 - 8.3 g/dL Final  . Albumin 08/12/2013 4.4  3.5 - 5.2 g/dL Final  . AST 08/12/2013 31  0 - 37 U/L Final  . ALT 08/12/2013 44  0 - 53 U/L Final  . Alkaline Phosphatase 08/12/2013 59  39 - 117 U/L Final  . Total Bilirubin 08/12/2013 0.4  0.3 - 1.2 mg/dL Final  . GFR calc non Af Amer 08/12/2013 >90  >90 mL/min Final  . GFR calc Af Amer 08/12/2013 >90  >90 mL/min Final   Comment: (NOTE)                          The eGFR has been calculated using the CKD EPI equation.                          This calculation has not been validated in all clinical situations.  eGFR's persistently <90 mL/min signify possible Chronic Kidney                          Disease.  [name] EBV VCA IgG 08/12/2013 656.0* <18.0 U/mL Final   Comment: (NOTE)                          Reference Range:       <18.0 U/mL = Negative                                            18.0-21.9 U/mL = Equivocal                                               >=22.0 U/mL = Positive  . EBV VCA IgM 08/12/2013 <10.0  <36.0 U/mL Final   Comment: (NOTE)                          Reference Range:       <36.0 U/mL = Negative                                            36.0-43.9 U/mL = Equivocal                                               >=44.0 U/mL = Positive  . EBV NA IgG 08/12/2013 70.7* <18.0 U/mL Final   Comment: (NOTE)                          Reference Range:       <18.0 U/mL = Negative                                            18.0-21.9 U/mL = Equivocal                                               >=22.0 U/mL = Positive                                Clinical Stage            VCA IgG   VCA IgM      EA    EBV NA                                Susceptibility               -          -         -       -  Very Early Infection        +/-       +/-        -       -                                Established Infection        +         +        +/-      -                                Recent Infection             +         +        +/-     +/-                                Past Infection               +         -        +/-      +                                                                                                                               +/- means positive or negative (not weak)                          High persisting antibody levels may be present in Burkitt's lymphoma                          and nasopharyngeal carcinoma.  . EBV EA IgG 08/12/2013 17.3* <9.0 U/mL Final   Comment: (NOTE)                          Reference Range:        <9.0 U/mL = Negative                                             9.0-10.9 U/mL = Equivocal                                               >=11.0 U/mL = Positive  Assay cross-reactivity for Early Antigen (EA) has been noted with some                          specimens containing antibody to Human Immunodeficiency Virus (HIV).                          HIV disease must be excluded before confirmation of EBV diagnosis.                            Performed at [facility]  . Vitamin B-12 08/12/2013 694  211 - 911 pg/mL Final   Performed at [facility]  . Folate 08/12/2013 >20.0   Final   Comment: (NOTE)                          Reference Ranges                                 Deficient:       0.4 - 3.3 ng/mL                                 Indeterminate:   3.4 - 5.4 ng/mL                                 Normal:              > 5.4 ng/mL                          Performed at [facility]  . CMV IgM 08/12/2013 <8.00  <30.00 AU/mL Final   Comment: (NOTE)                          Reference Range:        <30.00 AU/mL = Negative                                             30.00-34.99 AU/mL = Equivocal                                                >=35.00 AU/mL = Positive                          Results from any one IgM assay should not be used as a sole                          determinant of a current or recent infection. Because an IgM test can                          yield false positive results and low levels of IgM antibody may  persist for more than 12 months post infection, reliance on a single                          test result could be misleading. If an acute infection is suspected,                          consider obtaining a new specimen and submit for both IgG and IgM                          testing in two or more weeks.                          Performed at [facility]  . CMV Ab - IgG 08/12/2013 >10.00* <0.60 U/mL Final   Comment: (NOTE)                          Reference Range:          <0.60 U/mL = Negative                                               0.60-0.69 U/mL = Equivocal                                                  >=0.70 U/mL = Positive                           A positive result indicates that the patient has antibodies to CMV.                          It does not differentiate between an active or past infection.                          The clinical diagnosis must be interpreted in conjunction with the                          clinical signs and symptoms of the patient.                          Performed at [facility]: No new pathology.  Urinalysis No results found for this basename: colorurine,  appearanceur,  labspec,  phurine,  glucoseu,  hgbur,  bilirubinur,  ketonesur,  proteinur,  urobilinogen,  nitrite,  leukocytesur    RADIOGRAPHIC STUDIES: No results found.  ASSESSMENT:  #1. Leukopenia and thrombocytopenia secondary or previous viral infection with transient splenomegaly. No evidence of malignancy. #2. Diabetes mellitus with  intestinal paresis. #3. Diabetes mellitus, type II, non-insulin requiring, not ideally controlled. #4. Fractured nose with increased air swallowing. #5. Gastric ulcerations, currently taking proton pump inhibitors. #6. Nonalcoholic steatohepatitis by ultrasound.    PLAN:  #1. Increase metoclopramide 10 mg 4 times a day before meals and at bedtime. #2. Call if develops fever. #3.  Followup in 6 months with CBC, chem profile, and LDH.   All questions were answered. The patient knows to call the clinic with any problems, questions or concerns. We can certainly see the patient much sooner if necessary.   I spent 25 minutes counseling the patient face to face. The total time spent in the appointment was 30 minutes.    Doroteo Bradford, MD 08/27/2013 9:38 AM

## 2013-08-27 NOTE — Patient Instructions (Signed)
[facility] Discharge Instructions  RECOMMENDATIONS MADE BY THE CONSULTANT AND ANY TEST RESULTS WILL BE SENT TO YOUR REFERRING PHYSICIAN.  EXAM FINDINGS BY THE PHYSICIAN TODAY AND SIGNS OR SYMPTOMS TO REPORT TO CLINIC OR PRIMARY PHYSICIAN: Exam and findings as discussed by Dr. Barnet Glasgow.  Thinjks your enlarged spleen is due to previous viral occurrences.  MEDICATIONS PRESCRIBED:  Increase your metocloprapamide to 10 mg 4 times daily  INSTRUCTIONS/FOLLOW-UP: Follow-up in 6 months with labs and MD visit.  Thank you for choosing [facility] to provide your oncology and hematology care.  To afford each patient quality time with our providers, please arrive at least 15 minutes before your scheduled appointment time.  With your help, our goal is to use those 15 minutes to complete the necessary work-up to ensure our physicians have the information they need to help with your evaluation and healthcare recommendations.    Effective January 1st, 2014, we ask that you re-schedule your appointment with our physicians should you arrive 10 or more minutes late for your appointment.  We strive to give you quality time with our providers, and arriving late affects you and other patients whose appointments are after yours.    Again, thank you for choosing [facility].  Our hope is that these requests will decrease the amount of time that you wait before being seen by our physicians.       _____________________________________________________________  Should you have questions after your visit to [facility], please contact our office at [phone] between the hours of 8:30 a.m. and 5:00 p.m.  Voicemails left after 4:30 p.m. will not be returned until the following business day.  For prescription refill requests, have your pharmacy contact our office with your prescription refill request.

## 2013-09-05 ENCOUNTER — Emergency Department (HOSPITAL_COMMUNITY)
Admission: EM | Admit: 2013-09-05 | Discharge: 2013-09-05 | Disposition: A | Discharge status: Home / Self Care | Payer: 59 | Attending: Emergency Medicine | Admitting: Emergency Medicine

## 2013-09-05 ENCOUNTER — Emergency Department (HOSPITAL_COMMUNITY): Payer: 59

## 2013-09-05 ENCOUNTER — Encounter (HOSPITAL_COMMUNITY): Payer: Self-pay | Admitting: Emergency Medicine

## 2013-09-05 DIAGNOSIS — Z87891 Personal history of nicotine dependence: Secondary | ICD-10-CM | POA: Insufficient documentation

## 2013-09-05 DIAGNOSIS — F411 Generalized anxiety disorder: Secondary | ICD-10-CM | POA: Insufficient documentation

## 2013-09-05 DIAGNOSIS — J159 Unspecified bacterial pneumonia: Secondary | ICD-10-CM | POA: Insufficient documentation

## 2013-09-05 DIAGNOSIS — Z792 Long term (current) use of antibiotics: Secondary | ICD-10-CM | POA: Insufficient documentation

## 2013-09-05 DIAGNOSIS — F329 Major depressive disorder, single episode, unspecified: Secondary | ICD-10-CM | POA: Insufficient documentation

## 2013-09-05 DIAGNOSIS — Z79899 Other long term (current) drug therapy: Secondary | ICD-10-CM | POA: Insufficient documentation

## 2013-09-05 DIAGNOSIS — F3289 Other specified depressive episodes: Secondary | ICD-10-CM | POA: Insufficient documentation

## 2013-09-05 DIAGNOSIS — E119 Type 2 diabetes mellitus without complications: Secondary | ICD-10-CM | POA: Insufficient documentation

## 2013-09-05 DIAGNOSIS — J4489 Other specified chronic obstructive pulmonary disease: Secondary | ICD-10-CM | POA: Insufficient documentation

## 2013-09-05 DIAGNOSIS — J449 Chronic obstructive pulmonary disease, unspecified: Secondary | ICD-10-CM | POA: Insufficient documentation

## 2013-09-05 DIAGNOSIS — J189 Pneumonia, unspecified organism: Secondary | ICD-10-CM

## 2013-09-05 DIAGNOSIS — Z7982 Long term (current) use of aspirin: Secondary | ICD-10-CM | POA: Insufficient documentation

## 2013-09-05 DIAGNOSIS — IMO0002 Reserved for concepts with insufficient information to code with codable children: Secondary | ICD-10-CM | POA: Insufficient documentation

## 2013-09-05 LAB — CBC WITH DIFFERENTIAL/PLATELET
BASOS ABS: 0 10*3/uL (ref 0.0–0.1)
BASOS PCT: 0 % (ref 0–1)
EOS ABS: 0.1 10*3/uL (ref 0.0–0.7)
EOS PCT: 2 % (ref 0–5)
HCT: 43.3 % (ref 39.0–52.0)
Hemoglobin: 14.4 g/dL (ref 13.0–17.0)
Lymphocytes Relative: 7 % — ABNORMAL LOW (ref 12–46)
Lymphs Abs: 0.4 10*3/uL — ABNORMAL LOW (ref 0.7–4.0)
MCH: 28.1 pg (ref 26.0–34.0)
MCHC: 33.3 g/dL (ref 30.0–36.0)
MCV: 84.4 fL (ref 78.0–100.0)
Monocytes Absolute: 0.6 10*3/uL (ref 0.1–1.0)
Monocytes Relative: 10 % (ref 3–12)
Neutro Abs: 4.7 10*3/uL (ref 1.7–7.7)
Neutrophils Relative %: 81 % — ABNORMAL HIGH (ref 43–77)
PLATELETS: 93 10*3/uL — AB (ref 150–400)
RBC: 5.13 MIL/uL (ref 4.22–5.81)
RDW: 13.5 % (ref 11.5–15.5)
WBC: 5.9 10*3/uL (ref 4.0–10.5)

## 2013-09-05 LAB — BASIC METABOLIC PANEL
BUN: 20 mg/dL (ref 6–23)
CALCIUM: 9 mg/dL (ref 8.4–10.5)
CO2: 25 mEq/L (ref 19–32)
CREATININE: 1.22 mg/dL (ref 0.50–1.35)
Chloride: 99 mEq/L (ref 96–112)
GFR, EST AFRICAN AMERICAN: 74 mL/min — AB (ref >90)
GFR, EST NON AFRICAN AMERICAN: 64 mL/min — AB (ref >90)
Glucose, Bld: 132 mg/dL — ABNORMAL HIGH (ref 70–99)
Potassium: 3.7 mEq/L (ref 3.7–5.3)
SODIUM: 136 meq/L — AB (ref 137–147)

## 2013-09-05 MED ORDER — SODIUM CHLORIDE 0.9 % IV SOLN
1000.0000 mL | Freq: Once | INTRAVENOUS | Status: AC
  Administered 2013-09-05: 1000 mL via INTRAVENOUS

## 2013-09-05 MED ORDER — LEVOFLOXACIN IN D5W 500 MG/100ML IV SOLN
500.0000 mg | Freq: Once | INTRAVENOUS | Status: AC
  Administered 2013-09-05: 500 mg via INTRAVENOUS
  Filled 2013-09-05: qty 100

## 2013-09-05 MED ORDER — SODIUM CHLORIDE 0.9 % IV SOLN
1000.0000 mL | INTRAVENOUS | Status: DC
  Administered 2013-09-05: 1000 mL via INTRAVENOUS

## 2013-09-05 MED ORDER — LEVOFLOXACIN 750 MG PO TABS: 750.0000 mg | ORAL_TABLET | Freq: Every day | ORAL | Status: DC

## 2013-09-05 MED ORDER — SODIUM CHLORIDE 0.9 % IV BOLUS (SEPSIS)
1000.0000 mL | Freq: Once | INTRAVENOUS | Status: AC
  Administered 2013-09-05: 1000 mL via INTRAVENOUS

## 2013-09-05 MED ORDER — ALBUTEROL SULFATE (2.5 MG/3ML) 0.083% IN NEBU
5.0000 mg | INHALATION_SOLUTION | Freq: Once | RESPIRATORY_TRACT | Status: AC
  Administered 2013-09-05: 5 mg via RESPIRATORY_TRACT
  Filled 2013-09-05: qty 6

## 2013-09-05 NOTE — Discharge Instructions (Signed)
RETURN HERE WITH ANY HIGH FEVER, SIGNIFICANT SHORTNESS OF BREATH OR SYMPTOMS OF CONCERN. OTHERWISE, FOLLOW UP WITH DR. [name] ON Monday FOR RECHECK.   Pneumonia, Adult Pneumonia is an infection of the lungs.  CAUSES Pneumonia may be caused by bacteria or a virus. Usually, these infections are caused by breathing infectious particles into the lungs (respiratory tract). SYMPTOMS   Cough.  Fever.  Chest pain.  Increased rate of breathing.  Wheezing.  Mucus production. DIAGNOSIS  If you have the common symptoms of pneumonia, your caregiver will typically confirm the diagnosis with a chest X-ray. The X-ray will show an abnormality in the lung (pulmonary infiltrate) if you have pneumonia. Other tests of your blood, urine, or sputum may be done to find the specific cause of your pneumonia. Your caregiver may also do tests (blood gases or pulse oximetry) to see how well your lungs are working. TREATMENT  Some forms of pneumonia may be spread to other people when you cough or sneeze. You may be asked to wear a mask before and during your exam. Pneumonia that is caused by bacteria is treated with antibiotic medicine. Pneumonia that is caused by the influenza virus may be treated with an antiviral medicine. Most other viral infections must run their course. These infections will not respond to antibiotics.  PREVENTION A pneumococcal shot (vaccine) is available to prevent a common bacterial cause of pneumonia. This is usually suggested for:  People over 6 years old.  Patients on chemotherapy.  People with chronic lung problems, such as bronchitis or emphysema.  People with immune system problems. If you are over 65 or have a high risk condition, you may receive the pneumococcal vaccine if you have not received it before. In some countries, a routine influenza vaccine is also recommended. This vaccine can help prevent some cases of pneumonia.You may be offered the influenza vaccine as part of  your care. If you smoke, it is time to quit. You may receive instructions on how to stop smoking. Your caregiver can provide medicines and counseling to help you quit. HOME CARE INSTRUCTIONS   Cough suppressants may be used if you are losing too much rest. However, coughing protects you by clearing your lungs. You should avoid using cough suppressants if you can.  Your caregiver may have prescribed medicine if he or she thinks your pneumonia is caused by a bacteria or influenza. Finish your medicine even if you start to feel better.  Your caregiver may also prescribe an expectorant. This loosens the mucus to be coughed up.  Only take over-the-counter or prescription medicines for pain, discomfort, or fever as directed by your caregiver.  Do not smoke. Smoking is a common cause of bronchitis and can contribute to pneumonia. If you are a smoker and continue to smoke, your cough may last several weeks after your pneumonia has cleared.  A cold steam vaporizer or humidifier in your room or home may help loosen mucus.  Coughing is often worse at night. Sleeping in a semi-upright position in a recliner or using a couple pillows under your head will help with this.  Get rest as you feel it is needed. Your body will usually let you know when you need to rest. SEEK IMMEDIATE MEDICAL CARE IF:   Your illness becomes worse. This is especially true if you are elderly or weakened from any other disease.  You cannot control your cough with suppressants and are losing sleep.  You begin coughing up blood.  You develop pain  which is getting worse or is uncontrolled with medicines.  You have a fever.  Any of the symptoms which initially brought you in for treatment are getting worse rather than better.  You develop shortness of breath or chest pain. MAKE SURE YOU:   Understand these instructions.  Will watch your condition.  Will get help right away if you are not doing well or get  worse. Document Released: 06/25/2005 Document Revised: 09/17/2011 Document Reviewed: 09/14/2010 [facility] Patient Information 2014 [place], [place].

## 2013-09-05 NOTE — ED Notes (Signed)
Family reporting pt has had cough, congestion, fever for past 2 days.  Pt sleeping at present time.  No distress noted.

## 2013-09-05 NOTE — ED Notes (Signed)
Began having myalgias, chills yesterday.  Today had fever of 102.8 for which he took Tylenol at 1800.  Denies nausea, vomiting, diarrhea, but reports congestion and productive cough.

## 2013-09-05 NOTE — ED Notes (Signed)
Family at bedside. Patient resting.

## 2013-09-08 ENCOUNTER — Encounter: Payer: Self-pay | Admitting: Internal Medicine

## 2013-09-08 NOTE — ED Provider Notes (Signed)
Medical screening examination/treatment/procedure(s) were performed by non-physician practitioner and as supervising physician I was immediately available for consultation/collaboration.   EKG Interpretation None        Maudry Diego, MD 09/08/13 1452

## 2013-09-08 NOTE — ED Provider Notes (Signed)
CSN: [id number]     Arrival date & time 09/05/13  1919 History   First MD Initiated Contact with Patient 09/05/13 1952     Chief Complaint  Patient presents with  . Generalized Body Aches  . Fever     (Consider location/radiation/quality/duration/timing/severity/associated sxs/prior Treatment) Patient is a 58 y.o. male presenting with fever. The history is provided by the patient. No language interpreter was used.  Fever Duration:  1 day Timing:  Constant Progression:  Worsening Associated symptoms: chills, congestion, cough and myalgias   Associated symptoms: no chest pain, no nausea, no rash and no vomiting   Associated symptoms comment:  Chills, congestion, productive cough and fever since yesterday. No sick contacts. No N, V.    Past Medical History  Diagnosis Date  . PONV (postoperative nausea and vomiting)   . Asthma   . Dysrhythmia   . Diabetes mellitus   . GERD (gastroesophageal reflux disease)   . COPD (chronic obstructive pulmonary disease)   . Anxiety   . Depression   . Splenomegaly    Past Surgical History  Procedure Laterality Date  . Hernia repair      2010, september  . Vasectomy  1989  . Colonoscopy  08/31/2011    [id number] rectal and colon polyps-treated. Single tubular adenoma. Next TCS 08/2016  . Esophagogastroduodenoscopy (egd) with esophageal dilation N/A 05/15/2013    [id number] esophagus-status post Venia Minks dilation/Portal gastropathy. Antral erosions-status post biopsy. extrinsic compression along lesser curvature likely secondary to splenomegaly. gastric bx benign   Family History  Problem Relation Age of Onset  . Heart disease    . Arthritis    . Lung disease    . Cancer Mother     lymph nodes  . Asthma    . Diabetes    . Kidney disease    . Ovarian cancer Sister   . Colon cancer Neg Hx    History  Substance Use Topics  . Smoking status: Former Research scientist (life sciences)  . Smokeless tobacco: Former Systems developer     Comment: quit x 2 years  . Alcohol Use: No     Review of Systems  Constitutional: Positive for fever and chills.  HENT: Positive for congestion and sinus pressure. Negative for trouble swallowing.   Respiratory: Positive for cough. Negative for shortness of breath and wheezing.   Cardiovascular: Negative for chest pain.  Gastrointestinal: Negative for nausea, vomiting and abdominal pain.  Musculoskeletal: Positive for myalgias.  Skin: Negative for rash.      Allergies  Codeine  Home Medications   Current Outpatient Rx  Name  Route  Sig  Dispense  Refill  . albuterol (PROVENTIL HFA;VENTOLIN HFA) 108 (90 BASE) MCG/ACT inhaler   Inhalation   Inhale 2 puffs into the lungs every 6 (six) hours as needed. For shortness of breath         . ALPRAZolam (XANAX) 0.25 MG tablet   Oral   Take 0.25 mg by mouth at bedtime as needed for sleep.         [name] aspirin EC 81 MG tablet   Oral   Take 81 mg by mouth daily.         [name] atorvastatin (LIPITOR) 40 MG tablet   Oral   Take 1 tablet (40 mg total) by mouth daily.   90 tablet   1   . Choline Fenofibrate (TRILIPIX) 135 MG capsule   Oral   Take 1 capsule (135 mg total) by mouth daily.   90 capsule  1   . clotrimazole-betamethasone (LOTRISONE) cream   Topical   Apply 1 application topically 2 (two) times daily.         [name] diltiazem (DILACOR XR) 240 MG 24 hr capsule   Oral   Take 1 capsule (240 mg total) by mouth daily.   90 capsule   1   . Empagliflozin (JARDIANCE) 10 MG TABS   Oral   Take 10 mg by mouth daily.         [name] HYDROcodone-acetaminophen (NORCO/VICODIN) 5-325 MG per tablet   Oral   Take 1 tablet by mouth every 4 (four) hours as needed for moderate pain.         [name] metoCLOPramide (REGLAN) 5 MG tablet      Take 1 or 2 tablets 4 times a day before meals and at bedtime.   100 tablet   3   . Multiple Vitamin (MULITIVITAMIN WITH MINERALS) TABS   Oral   Take 1 tablet by mouth daily.         [name] omeprazole (PRILOSEC) 20 MG capsule   Oral   Take 20  mg by mouth daily.         . SitaGLIPtin-MetFORMIN HCl (JANUMET XR) [phone] MG TB24   Oral   Take 1 tablet by mouth daily.         . tamsulosin (FLOMAX) 0.4 MG CAPS capsule   Oral   Take 0.4 mg by mouth daily.         [name] tiotropium (SPIRIVA) 18 MCG inhalation capsule   Inhalation   Place 18 mcg into inhaler and inhale daily.         [name] levofloxacin (LEVAQUIN) 750 MG tablet   Oral   Take 1 tablet (750 mg total) by mouth daily.   7 tablet   0   . simethicone (MYLICON) 505 MG chewable tablet   Oral   Chew 250 mg by mouth every 6 (six) hours as needed for flatulence.          BP 99/62  Pulse 111  Temp(Src) 97.8 F (36.6 C) (Oral)  Resp 20  Ht 5' 7"  (1.702 m)  Wt 170 lb (77.111 kg)  BMI 26.62 kg/m2  SpO2 94% Physical Exam  Constitutional: He is oriented to person, place, and time. He appears well-developed and well-nourished.  HENT:  Head: Normocephalic.  Eyes: Conjunctivae are normal.  Neck: Normal range of motion. Neck supple.  Cardiovascular: Normal rate and regular rhythm.   No murmur heard. Pulmonary/Chest: Effort normal. No respiratory distress. He has rales. He exhibits no tenderness.  Abdominal: Soft. Bowel sounds are normal. There is no tenderness. There is no rebound and no guarding.  Musculoskeletal: Normal range of motion. He exhibits no edema.  Neurological: He is alert and oriented to person, place, and time.  Skin: Skin is warm and dry. No rash noted.  Psychiatric: He has a normal mood and affect.    ED Course  Procedures (including critical care time) Labs Review Labs Reviewed  CBC WITH DIFFERENTIAL - Abnormal; Notable for the following:    Platelets 93 (*)    Neutrophils Relative % 81 (*)    Lymphocytes Relative 7 (*)    Lymphs Abs 0.4 (*)    All other components within normal limits  BASIC METABOLIC PANEL - Abnormal; Notable for the following:    Sodium 136 (*)    Glucose, Bld 132 (*)    GFR calc non Af Amer 64 (*)  GFR calc Af  Amer 74 (*)    All other components within normal limits   Imaging Review No results found.   EKG Interpretation None      MDM   Final diagnoses:  CAP (community acquired pneumonia)    1. CAP  He is feeling better with IV fluids, VS improved. CXR showing PNA which was treated with levaquin in ED. Dr. Roderic Palau has seen and evaluated patient and it is felt he can be treated as an outpatient as he is well appearing and feeling better. Discussed return precautions.     Dewaine Oats, PA-C 09/08/13 1350

## 2013-10-05 ENCOUNTER — Encounter: Payer: Self-pay | Admitting: *Deleted

## 2013-10-07 ENCOUNTER — Encounter: Payer: Self-pay | Admitting: Internal Medicine

## 2013-10-07 ENCOUNTER — Telehealth: Payer: Self-pay | Admitting: Internal Medicine

## 2013-10-07 ENCOUNTER — Ambulatory Visit (INDEPENDENT_AMBULATORY_CARE_PROVIDER_SITE_OTHER): Payer: 59 | Admitting: Internal Medicine

## 2013-10-07 VITALS — BP 118/80 | HR 90 | Ht 67.0 in | Wt 169.8 lb

## 2013-10-07 DIAGNOSIS — E785 Hyperlipidemia, unspecified: Secondary | ICD-10-CM

## 2013-10-07 DIAGNOSIS — E119 Type 2 diabetes mellitus without complications: Secondary | ICD-10-CM

## 2013-10-07 DIAGNOSIS — I10 Essential (primary) hypertension: Secondary | ICD-10-CM

## 2013-10-07 MED ORDER — ATORVASTATIN CALCIUM 40 MG PO TABS: 40.0000 mg | ORAL_TABLET | Freq: Every day | ORAL | Status: DC

## 2013-10-07 MED ORDER — DILTIAZEM HCL ER 240 MG PO CP24
240.0000 mg | ORAL_CAPSULE | Freq: Every day | ORAL | Status: DC
Start: 2013-10-07 — End: 2014-11-05

## 2013-10-07 NOTE — Telephone Encounter (Signed)
Patient called to inform that refills were sent to [facility] on 4/1

## 2013-10-07 NOTE — Telephone Encounter (Signed)
Pt saw Dr Debara Pickett today and he was going to send in some prescriptions for him. He forgot to tell him he changed pharmacy. Please call to [phone].

## 2013-10-07 NOTE — Patient Instructions (Signed)
Your physician wants you to follow-up in: 1 year with Dr. Debara Pickett. You will receive a reminder letter in the mail two months in advance. If you don't receive a letter, please call our office to schedule the follow-up appointment.

## 2013-10-13 ENCOUNTER — Other Ambulatory Visit (HOSPITAL_COMMUNITY): Payer: Self-pay | Admitting: Pulmonary Disease

## 2013-10-13 ENCOUNTER — Ambulatory Visit (HOSPITAL_COMMUNITY)
Admission: RE | Admit: 2013-10-13 | Discharge: 2013-10-13 | Disposition: A | Discharge status: Home / Self Care | Payer: 59 | Source: Ambulatory Visit | Attending: Pulmonary Disease | Admitting: Pulmonary Disease

## 2013-10-13 DIAGNOSIS — J189 Pneumonia, unspecified organism: Secondary | ICD-10-CM | POA: Insufficient documentation

## 2013-10-13 DIAGNOSIS — J8410 Pulmonary fibrosis, unspecified: Secondary | ICD-10-CM | POA: Insufficient documentation

## 2013-10-14 ENCOUNTER — Encounter: Payer: Self-pay | Admitting: Internal Medicine

## 2013-10-14 DIAGNOSIS — E785 Hyperlipidemia, unspecified: Secondary | ICD-10-CM | POA: Insufficient documentation

## 2013-10-14 DIAGNOSIS — I10 Essential (primary) hypertension: Secondary | ICD-10-CM | POA: Insufficient documentation

## 2013-10-14 DIAGNOSIS — E119 Type 2 diabetes mellitus without complications: Secondary | ICD-10-CM | POA: Insufficient documentation

## 2013-10-14 NOTE — Progress Notes (Signed)
OFFICE NOTE  Chief Complaint:  Routine follow-up  Primary Care Physician: Alonza Bogus, MD  HPI:  [name]  is a 58 year old gentleman with a history of hypertension, dyslipidemia, and remote chest pain. In 2003, he had a heart catheterization, which was negative. Fortunately, he quit smoking about a year ago and has gained some weight, but has since lost that. 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 He continues to complain of fatigue and now has concerns about erectile dysfunction today, which I will defer to you. He denies any chest pain or worsening shortness of breath. He does report a small amount of heartburn, which is improved with over-the-counter heartburn relief. Apparently, he had pneumonia in March. He does have some swelling in his legs. He seems to be getting over his pneumonia. He denies any chest pain.  PMHx:  Past Medical History  Diagnosis Date  . PONV (postoperative nausea and vomiting)   . Asthma   . Dysrhythmia   . Diabetes mellitus   . GERD (gastroesophageal reflux disease)   . COPD (chronic obstructive pulmonary disease)   . Anxiety   . Depression   . Splenomegaly   . Hypertension   . Dyslipidemia     Past Surgical History  Procedure Laterality Date  . Hernia repair  03/2009  . Vasectomy  1989  . Colonoscopy  08/31/2011    [id number] rectal and colon polyps-treated. Single tubular adenoma. Next TCS 08/2016  . Esophagogastroduodenoscopy (egd) with esophageal dilation N/A 05/15/2013    [id number] esophagus-status post Venia Minks dilation/Portal gastropathy. Antral erosions-status post biopsy. extrinsic compression along lesser curvature likely secondary to splenomegaly. gastric bx benign  . Cardiac catheterization  10/22/2001    normal coronary arteriea (Dr. Domenic Moras)  . Transthoracic echocardiogram  2011    EF 73-41%, stage 1 diastolic dysfunction, trace TR & pulm valve regurg      FAMHx:  Family History  Problem Relation Age of Onset  . Heart disease Mother   . Arthritis    . Lung disease    . Cancer Mother     lymph nodes  . Asthma    . Diabetes Mother   . Kidney disease    . Ovarian cancer Sister   . Colon cancer Neg Hx   . Diabetes Brother   . Heart disease Brother   . Hypertension Brother   . Diabetes Sister   . Diabetes Sister     SOCHx:   reports that he quit smoking about 2 years ago. He has quit using smokeless tobacco. He reports that he does not drink alcohol or use illicit drugs.  ALLERGIES:  Allergies  Allergen Reactions  . Codeine Rash  . Simvastatin Nausea Only    ROS: A comprehensive review of systems was negative except for: Respiratory: positive for cough and dyspnea on exertion  HOME MEDS: Current Outpatient Prescriptions  Medication Sig Dispense Refill  . albuterol (PROVENTIL HFA;VENTOLIN HFA) 108 (90 BASE) MCG/ACT inhaler Inhale 2 puffs into the lungs every 6 (six) hours as needed. For shortness of breath      . ALPRAZolam (XANAX) 0.25 MG tablet Take 0.25 mg by mouth at bedtime as needed for sleep.      [name] aspirin EC 81 MG tablet Take 81 mg by mouth daily.      [name] atorvastatin (LIPITOR) 40 MG tablet Take 1 tablet (40 mg total) by mouth daily.  90 tablet  3  . Choline Fenofibrate (TRILIPIX) 135 MG capsule Take 1 capsule (135 mg total) by mouth daily.  90 capsule  1  . clotrimazole-betamethasone (LOTRISONE) cream Apply 1 application topically 2 (two) times daily.      [name] diltiazem (DILACOR XR) 240 MG 24 hr capsule Take 1 capsule (240 mg total) by mouth daily.  90 capsule  3  . Empagliflozin (JARDIANCE) 10 MG TABS Take 10 mg by mouth daily.      [name] HYDROcodone-acetaminophen (NORCO/VICODIN) 5-325 MG per tablet Take 1 tablet by mouth every 4 (four) hours as needed for moderate pain.      [name] levofloxacin (LEVAQUIN) 750 MG tablet Take 1 tablet (750 mg total) by mouth daily.  7 tablet  0  . metoCLOPramide (REGLAN) 10 MG tablet Take 10  mg by mouth 4 (four) times daily.      . Multiple Vitamin (MULITIVITAMIN WITH MINERALS) TABS Take 1 tablet by mouth daily.      [name] omeprazole (PRILOSEC) 20 MG capsule Take 20 mg by mouth daily.      . pantoprazole (PROTONIX) 40 MG tablet Take 40 mg by mouth daily.      . simethicone (MYLICON) 142 MG chewable tablet Chew 250 mg by mouth every 6 (six) hours as needed for flatulence.      . SitaGLIPtin-MetFORMIN HCl (JANUMET XR) [phone] MG TB24 Take 1 tablet by mouth daily.      . tamsulosin (FLOMAX) 0.4 MG CAPS capsule Take 0.4 mg by mouth daily.      [name] tiotropium (SPIRIVA) 18 MCG inhalation capsule Place 18 mcg into inhaler and inhale daily.       No current facility-administered medications for this visit.    LABS/IMAGING: No results found for this or any previous visit (from the past 48 hour(s)). Dg Chest 2 View  10/13/2013   CLINICAL DATA:  Pneumonia  EXAM: CHEST  2 VIEW  COMPARISON:  09/05/2013  FINDINGS: Stable calcified granulomas in the right upper lobe as before. Normal heart size without edema or definite pneumonia. Improvement in the interstitial edema pattern compared to the prior study. No definite focal pneumonia, collapse or consolidation. No effusion or pneumothorax. Trachea is midline. Remote right rib fractures.  IMPRESSION: Resolved interstitial edema pattern compared to the prior study.  No definite focal airspace process or pneumonia  Remote granulomatous disease in the right upper lobe   Electronically Signed   By: Daryll Brod M.D.   On: 10/13/2013 08:36    VITALS: BP 118/80  Pulse 90  Ht 5' 7"  (1.702 m)  Wt 169 lb 12.8 oz (77.021 kg)  BMI 26.59 kg/m2  EXAM: General appearance: alert and no distress Neck: no carotid bruit and no JVD Lungs: clear to auscultation bilaterally Heart: regular rate and rhythm, S1, S2 normal, no murmur, click, rub or gallop Abdomen: soft, non-tender; bowel sounds normal; no masses,  no organomegaly Extremities: extremities normal,  atraumatic, no cyanosis or edema Pulses: 2+ and symmetric Skin: Skin color, texture, turgor normal. No rashes or lesions Neurologic: Grossly normal Psych: Mood, affect normal  EKG: Sinus rhythm with occasional PVCs at 90  ASSESSMENT: 1. Hypertension-controlled 2. Dyslipidemia-at goal 3. Diabetes type 2  PLAN: 1.   Mr. Bethel is doing well from a cardiac standpoint. His blood pressure is well controlled his cholesterol is at goal. His diabetes is being followed by his primary care provider. He has no cardiac complaints at this time. I recommend continuing his current medications and have him back  annually.  Pixie Casino, MD, [facility] Attending Cardiologist [place] 10/14/2013, 6:22 PM

## 2013-11-27 ENCOUNTER — Other Ambulatory Visit: Payer: Self-pay | Admitting: Internal Medicine

## 2013-11-27 NOTE — Telephone Encounter (Signed)
LMTCB

## 2014-02-22 ENCOUNTER — Other Ambulatory Visit: Payer: Self-pay | Admitting: Gastroenterology

## 2014-02-22 NOTE — Telephone Encounter (Signed)
open in error

## 2014-02-24 ENCOUNTER — Other Ambulatory Visit (HOSPITAL_COMMUNITY): Payer: 59

## 2014-02-24 ENCOUNTER — Ambulatory Visit (HOSPITAL_COMMUNITY): Payer: 59

## 2014-02-25 ENCOUNTER — Other Ambulatory Visit (HOSPITAL_COMMUNITY): Payer: Self-pay

## 2014-02-25 NOTE — Progress Notes (Signed)
See other note This encounter was created in error - please disregard.

## 2014-03-05 ENCOUNTER — Encounter (HOSPITAL_COMMUNITY): Payer: 59 | Attending: Hematology and Oncology

## 2014-03-05 ENCOUNTER — Encounter (HOSPITAL_COMMUNITY): Payer: 59

## 2014-03-05 ENCOUNTER — Encounter (HOSPITAL_COMMUNITY): Payer: Self-pay

## 2014-03-05 VITALS — BP 123/67 | HR 92 | Temp 97.9°F | Resp 20 | Wt 165.6 lb

## 2014-03-05 DIAGNOSIS — Z888 Allergy status to other drugs, medicaments and biological substances status: Secondary | ICD-10-CM | POA: Diagnosis not present

## 2014-03-05 DIAGNOSIS — M6281 Muscle weakness (generalized): Secondary | ICD-10-CM | POA: Insufficient documentation

## 2014-03-05 DIAGNOSIS — D696 Thrombocytopenia, unspecified: Secondary | ICD-10-CM | POA: Insufficient documentation

## 2014-03-05 DIAGNOSIS — E785 Hyperlipidemia, unspecified: Secondary | ICD-10-CM | POA: Diagnosis not present

## 2014-03-05 DIAGNOSIS — F458 Other somatoform disorders: Secondary | ICD-10-CM | POA: Diagnosis not present

## 2014-03-05 DIAGNOSIS — Z7982 Long term (current) use of aspirin: Secondary | ICD-10-CM | POA: Insufficient documentation

## 2014-03-05 DIAGNOSIS — R161 Splenomegaly, not elsewhere classified: Secondary | ICD-10-CM

## 2014-03-05 DIAGNOSIS — M5137 Other intervertebral disc degeneration, lumbosacral region: Secondary | ICD-10-CM | POA: Diagnosis not present

## 2014-03-05 DIAGNOSIS — E1169 Type 2 diabetes mellitus with other specified complication: Secondary | ICD-10-CM | POA: Diagnosis not present

## 2014-03-05 DIAGNOSIS — I499 Cardiac arrhythmia, unspecified: Secondary | ICD-10-CM | POA: Insufficient documentation

## 2014-03-05 DIAGNOSIS — F329 Major depressive disorder, single episode, unspecified: Secondary | ICD-10-CM | POA: Insufficient documentation

## 2014-03-05 DIAGNOSIS — Z8249 Family history of ischemic heart disease and other diseases of the circulatory system: Secondary | ICD-10-CM | POA: Diagnosis not present

## 2014-03-05 DIAGNOSIS — F411 Generalized anxiety disorder: Secondary | ICD-10-CM | POA: Insufficient documentation

## 2014-03-05 DIAGNOSIS — K7689 Other specified diseases of liver: Secondary | ICD-10-CM | POA: Insufficient documentation

## 2014-03-05 DIAGNOSIS — I10 Essential (primary) hypertension: Secondary | ICD-10-CM | POA: Insufficient documentation

## 2014-03-05 DIAGNOSIS — D72819 Decreased white blood cell count, unspecified: Secondary | ICD-10-CM | POA: Insufficient documentation

## 2014-03-05 DIAGNOSIS — R141 Gas pain: Secondary | ICD-10-CM

## 2014-03-05 DIAGNOSIS — F3289 Other specified depressive episodes: Secondary | ICD-10-CM | POA: Insufficient documentation

## 2014-03-05 DIAGNOSIS — K219 Gastro-esophageal reflux disease without esophagitis: Secondary | ICD-10-CM | POA: Insufficient documentation

## 2014-03-05 DIAGNOSIS — K8020 Calculus of gallbladder without cholecystitis without obstruction: Secondary | ICD-10-CM | POA: Diagnosis not present

## 2014-03-05 DIAGNOSIS — J449 Chronic obstructive pulmonary disease, unspecified: Secondary | ICD-10-CM | POA: Insufficient documentation

## 2014-03-05 DIAGNOSIS — Z825 Family history of asthma and other chronic lower respiratory diseases: Secondary | ICD-10-CM | POA: Diagnosis not present

## 2014-03-05 DIAGNOSIS — K5900 Constipation, unspecified: Secondary | ICD-10-CM | POA: Insufficient documentation

## 2014-03-05 DIAGNOSIS — Z9889 Other specified postprocedural states: Secondary | ICD-10-CM | POA: Diagnosis not present

## 2014-03-05 DIAGNOSIS — K560 Paralytic ileus: Secondary | ICD-10-CM | POA: Insufficient documentation

## 2014-03-05 DIAGNOSIS — R1084 Generalized abdominal pain: Secondary | ICD-10-CM | POA: Insufficient documentation

## 2014-03-05 DIAGNOSIS — R143 Flatulence: Secondary | ICD-10-CM

## 2014-03-05 DIAGNOSIS — Z885 Allergy status to narcotic agent status: Secondary | ICD-10-CM | POA: Insufficient documentation

## 2014-03-05 DIAGNOSIS — K760 Fatty (change of) liver, not elsewhere classified: Secondary | ICD-10-CM

## 2014-03-05 DIAGNOSIS — Z809 Family history of malignant neoplasm, unspecified: Secondary | ICD-10-CM | POA: Diagnosis not present

## 2014-03-05 DIAGNOSIS — M51379 Other intervertebral disc degeneration, lumbosacral region without mention of lumbar back pain or lower extremity pain: Secondary | ICD-10-CM | POA: Insufficient documentation

## 2014-03-05 DIAGNOSIS — K259 Gastric ulcer, unspecified as acute or chronic, without hemorrhage or perforation: Secondary | ICD-10-CM | POA: Diagnosis not present

## 2014-03-05 DIAGNOSIS — T50995A Adverse effect of other drugs, medicaments and biological substances, initial encounter: Secondary | ICD-10-CM | POA: Diagnosis not present

## 2014-03-05 DIAGNOSIS — J4489 Other specified chronic obstructive pulmonary disease: Secondary | ICD-10-CM | POA: Insufficient documentation

## 2014-03-05 DIAGNOSIS — Z79899 Other long term (current) drug therapy: Secondary | ICD-10-CM | POA: Diagnosis not present

## 2014-03-05 DIAGNOSIS — R142 Eructation: Secondary | ICD-10-CM

## 2014-03-05 LAB — COMPREHENSIVE METABOLIC PANEL
ALK PHOS: 79 U/L (ref 39–117)
ALT: 73 U/L — ABNORMAL HIGH (ref 0–53)
ANION GAP: 15 (ref 5–15)
AST: 39 U/L — AB (ref 0–37)
Albumin: 4.7 g/dL (ref 3.5–5.2)
BUN: 20 mg/dL (ref 6–23)
CO2: 26 mEq/L (ref 19–32)
Calcium: 10.5 mg/dL (ref 8.4–10.5)
Chloride: 99 mEq/L (ref 96–112)
Creatinine, Ser: 0.97 mg/dL (ref 0.50–1.35)
GFR calc Af Amer: >90 mL/min (ref >90)
GFR calc non Af Amer: 89 mL/min — ABNORMAL LOW (ref >90)
Glucose, Bld: 186 mg/dL — ABNORMAL HIGH (ref 70–99)
Potassium: 4.2 mEq/L (ref 3.7–5.3)
Sodium: 140 mEq/L (ref 137–147)
Total Bilirubin: 0.6 mg/dL (ref 0.3–1.2)
Total Protein: 9.2 g/dL — ABNORMAL HIGH (ref 6.0–8.3)

## 2014-03-05 LAB — CBC WITH DIFFERENTIAL/PLATELET
BASOS ABS: 0 10*3/uL (ref 0.0–0.1)
Basophils Relative: 1 % (ref 0–1)
Eosinophils Absolute: 0.4 10*3/uL (ref 0.0–0.7)
Eosinophils Relative: 7 % — ABNORMAL HIGH (ref 0–5)
HCT: 48.4 % (ref 39.0–52.0)
HEMOGLOBIN: 16.1 g/dL (ref 13.0–17.0)
LYMPHS PCT: 21 % (ref 12–46)
Lymphs Abs: 1.1 10*3/uL (ref 0.7–4.0)
MCH: 26.8 pg (ref 26.0–34.0)
MCHC: 33.3 g/dL (ref 30.0–36.0)
MCV: 80.7 fL (ref 78.0–100.0)
MONO ABS: 0.4 10*3/uL (ref 0.1–1.0)
Monocytes Relative: 7 % (ref 3–12)
Neutro Abs: 3.5 10*3/uL (ref 1.7–7.7)
Neutrophils Relative %: 65 % (ref 43–77)
Platelets: 90 10*3/uL — ABNORMAL LOW (ref 150–400)
RBC: 6 MIL/uL — ABNORMAL HIGH (ref 4.22–5.81)
RDW: 14.7 % (ref 11.5–15.5)
WBC: 5.4 10*3/uL (ref 4.0–10.5)

## 2014-03-05 LAB — LACTATE DEHYDROGENASE: LDH: 156 U/L (ref 94–250)

## 2014-03-05 NOTE — Progress Notes (Signed)
[facility]  OFFICE PROGRESS NOTE  Alonza Bogus, MD [place] [address]  DIAGNOSIS: Leukopenia - Plan: US ABDOMEN COMPLETE W/ELASTOGRAPHY, CBC with Differential, Comprehensive metabolic panel, Lactate dehydrogenase  Thrombocytopenia, unspecified - Plan: US ABDOMEN COMPLETE W/ELASTOGRAPHY  Splenomegaly - Plan: US ABDOMEN COMPLETE W/ELASTOGRAPHY  Non-alcoholic fatty liver disease - Plan: US ABDOMEN COMPLETE W/ELASTOGRAPHY  Chief Complaint  Patient presents with  . Splenomegaly  . Thrombocytopenia  . Leukopenia  . Fatty liver    CURRENT THERAPY: Metoclopramide  INTERVAL HISTORY: [name] 58 y.o. male returns for followup of splenomegaly with mild thrombocytopenia in the setting of diabetes and intestinal paresis. Demonstrated cholelithiasis in the past along with fatty liver by ultrasound. Abdominal distention is somewhat improved. He denies any fever, night sweats, diarrhea, constipation, lower extremity swelling or redness, cough, wheezing, PND, orthopnea, palpitations, sore throat, chest pain, skin rash, headache, or seizures.    MEDICAL HISTORY: Past Medical History  Diagnosis Date  . PONV (postoperative nausea and vomiting)   . Asthma   . Dysrhythmia   . Diabetes mellitus   . GERD (gastroesophageal reflux disease)   . COPD (chronic obstructive pulmonary disease)   . Anxiety   . Depression   . Splenomegaly   . Hypertension   . Dyslipidemia     INTERIM HISTORY: has Bilateral leg pain; Back pain with radiation; DDD (degenerative disc disease), lumbar; Lumbago; Muscle weakness (generalized); Abdominal bloating; Abdominal pain, generalized; Non-alcoholic fatty liver disease; Splenomegaly; Lower abdominal pain; Abnormal LFTs; GERD (gastroesophageal reflux disease); Unspecified constipation; Leukopenia; Thrombocytopenia, unspecified; HTN (hypertension); Dyslipidemia; and DM2 (diabetes  mellitus, type 2) on his problem list.    ALLERGIES:  is allergic to codeine and simvastatin.  MEDICATIONS: has a current medication list which includes the following prescription(s): albuterol, alprazolam, aspirin ec, atorvastatin, fenofibric acid, clotrimazole-betamethasone, diltiazem, empagliflozin, hydrocodone-acetaminophen, metoclopramide, multivitamin with minerals, omeprazole, pantoprazole, sitagliptin-metformin hcl, tamsulosin, tiotropium, levofloxacin, and simethicone.  SURGICAL HISTORY:  Past Surgical History  Procedure Laterality Date  . Hernia repair  03/2009  . Vasectomy  1989  . Colonoscopy  08/31/2011    [id number] rectal and colon polyps-treated. Single tubular adenoma. Next TCS 08/2016  . Esophagogastroduodenoscopy (egd) with esophageal dilation N/A 05/15/2013    [id number] esophagus-status post Venia Minks dilation/Portal gastropathy. Antral erosions-status post biopsy. extrinsic compression along lesser curvature likely secondary to splenomegaly. gastric bx benign  . Cardiac catheterization  10/22/2001    normal coronary arteriea (Dr. Domenic Moras)  . Transthoracic echocardiogram  2011    EF 79-48%, stage 1 diastolic dysfunction, trace TR & pulm valve regurg     FAMILY HISTORY: family history includes Arthritis in an other family member; Asthma in an other family member; Cancer in his mother; Diabetes in his brother, mother, sister, and sister; Heart disease in his brother and mother; Hypertension in his brother; Kidney disease in an other family member; Lung disease in an other family member; Ovarian cancer in his sister. There is no history of Colon cancer.  SOCIAL HISTORY:  reports that he quit smoking about 2 years ago. He has quit using smokeless tobacco. He reports that he does not drink alcohol or use illicit drugs.  REVIEW OF SYSTEMS:  Other than that discussed above is noncontributory.  PHYSICAL EXAMINATION: ECOG PERFORMANCE STATUS: 1 - Symptomatic but completely  ambulatory  Blood pressure 123/67, pulse 92, temperature 97.9 F (36.6 C), temperature source Oral, resp. rate 20, weight 165 lb 9.6  oz (75.116 kg).  GENERAL:alert, no distress and comfortable SKIN: skin color, texture, turgor are normal, no rashes or significant lesions EYES: PERLA; Conjunctiva are pink and non-injected, sclera clear SINUSES: No redness or tenderness over maxillary or ethmoid sinuses. Knows bone distorted. OROPHARYNX:no exudate, no erythema on lips, buccal mucosa, or tongue. NECK: supple, thyroid normal size, non-tender, without nodularity. No masses CHEST: Increased AP diameter with no gynecomastia. No spider angiomata. LYMPH:  no palpable lymphadenopathy in the cervical, axillary or inguinal LUNGS: clear to auscultation and percussion with normal breathing effort HEART: regular rate & rhythm and no murmurs. ABDOMEN:abdomen soft, non-tender and normal bowel sounds. Distended with a tympanic percussion no. Liver and spleen not palpable. No free fluid wave or shifting dullness. MUSCULOSKELETAL:no cyanosis of digits and no clubbing. Range of motion normal.  NEURO: alert & oriented x 3 with fluent speech, no focal motor/sensory deficits. No evidence of asterixis.   LABORATORY DATA: Appointment on 03/05/2014  Component Date Value Ref Range Status  . WBC 03/05/2014 5.4  4.0 - 10.5 K/uL Final  . RBC 03/05/2014 6.00* 4.22 - 5.81 MIL/uL Final  . Hemoglobin 03/05/2014 16.1  13.0 - 17.0 g/dL Final  . HCT 03/05/2014 48.4  39.0 - 52.0 % Final  . MCV 03/05/2014 80.7  78.0 - 100.0 fL Final  . MCH 03/05/2014 26.8  26.0 - 34.0 pg Final  . MCHC 03/05/2014 33.3  30.0 - 36.0 g/dL Final  . RDW 03/05/2014 14.7  11.5 - 15.5 % Final  . Platelets 03/05/2014 90* 150 - 400 K/uL Final  . Neutrophils Relative % 03/05/2014 65  43 - 77 % Final  . Neutro Abs 03/05/2014 3.5  1.7 - 7.7 K/uL Final  . Lymphocytes Relative 03/05/2014 21  12 - 46 % Final  . Lymphs Abs 03/05/2014 1.1  0.7 - 4.0 K/uL  Final  . Monocytes Relative 03/05/2014 7  3 - 12 % Final  . Monocytes Absolute 03/05/2014 0.4  0.1 - 1.0 K/uL Final  . Eosinophils Relative 03/05/2014 7* 0 - 5 % Final  . Eosinophils Absolute 03/05/2014 0.4  0.0 - 0.7 K/uL Final  . Basophils Relative 03/05/2014 1  0 - 1 % Final  . Basophils Absolute 03/05/2014 0.0  0.0 - 0.1 K/uL Final  . Smear Review 03/05/2014 SPECIMEN CHECKED FOR CLOTS   Final   Comment: PLATELETS APPEAR DECREASED                          PLATELET COUNT CONFIRMED BY SMEAR  . Sodium 03/05/2014 140  137 - 147 mEq/L Final  . Potassium 03/05/2014 4.2  3.7 - 5.3 mEq/L Final  . Chloride 03/05/2014 99  96 - 112 mEq/L Final  . CO2 03/05/2014 26  19 - 32 mEq/L Final  . Glucose, Bld 03/05/2014 186* 70 - 99 mg/dL Final  . BUN 03/05/2014 20  6 - 23 mg/dL Final  . Creatinine, Ser 03/05/2014 0.97  0.50 - 1.35 mg/dL Final  . Calcium 03/05/2014 10.5  8.4 - 10.5 mg/dL Final  . Total Protein 03/05/2014 9.2* 6.0 - 8.3 g/dL Final  . Albumin 03/05/2014 4.7  3.5 - 5.2 g/dL Final  . AST 03/05/2014 39* 0 - 37 U/L Final  . ALT 03/05/2014 73* 0 - 53 U/L Final  . Alkaline Phosphatase 03/05/2014 79  39 - 117 U/L Final  . Total Bilirubin 03/05/2014 0.6  0.3 - 1.2 mg/dL Final  . GFR calc non Af Amer 03/05/2014 89* >  90 mL/min Final  . GFR calc Af Amer 03/05/2014 >90  >90 mL/min Final   Comment: (NOTE)                          The eGFR has been calculated using the CKD EPI equation.                          This calculation has not been validated in all clinical situations.                          eGFR's persistently <90 mL/min signify possible Chronic Kidney                          Disease.  . Anion gap 03/05/2014 15  5 - 15 Final  . LDH 03/05/2014 156  94 - 250 U/L Final    PATHOLOGY: No new pathology.  Urinalysis No results found for this basename: colorurine,  appearanceur,  labspec,  phurine,  glucoseu,  hgbur,  bilirubinur,  ketonesur,  proteinur,  urobilinogen,  nitrite,   leukocytesur    RADIOGRAPHIC STUDIES: No results found.  ASSESSMENT:  #1. Thrombocytopenia  with transient splenomegaly. No evidence of malignancy.  #2. Diabetes mellitus with intestinal paresis.  #3. Diabetes mellitus, type II, non-insulin requiring, not ideally controlled.  #4. Fractured nose with increased air swallowing.  #5. Gastric ulcerations, currently taking proton pump inhibitors.  #6. Nonalcoholic steatohepatitis by ultrasound. #7. Abdominal distention due to air swallowing as a result of previous nasal trauma.      PLAN:  #1. Continue current medications. #2. Liver elastogram to assess degree of fibrosis and portal hypertension. #3. Patient was told to call the day after the studies done for discussion of the results. #4. Followup in 6 months with CBC, chem profile, LDH.   All questions were answered. The patient knows to call the clinic with any problems, questions or concerns. We can certainly see the patient much sooner if necessary.   I spent 25 minutes counseling the patient face to face. The total time spent in the appointment was 30 minutes.    Doroteo Bradford, MD 03/05/2014 1:50 PM  DISCLAIMER:  This note was dictated with voice recognition software.  Similar sounding words can inadvertently be transcribed inaccurately and may not be corrected upon review.

## 2014-03-05 NOTE — Patient Instructions (Signed)
[facility] Discharge Instructions  RECOMMENDATIONS MADE BY THE CONSULTANT AND ANY TEST RESULTS WILL BE SENT TO YOUR REFERRING PHYSICIAN.  EXAM FINDINGS BY THE PHYSICIAN TODAY AND SIGNS OR SYMPTOMS TO REPORT TO CLINIC OR PRIMARY PHYSICIAN:  You saw Dr Barnet Glasgow today  Follow up in 6 months with lab work.  You will have an ultrasound of your liver and spleen.    Thank you for choosing [facility] to provide your oncology and hematology care.  To afford each patient quality time with our providers, please arrive at least 15 minutes before your scheduled appointment time.  With your help, our goal is to use those 15 minutes to complete the necessary work-up to ensure our physicians have the information they need to help with your evaluation and healthcare recommendations.    Effective January 1st, 2014, we ask that you re-schedule your appointment with our physicians should you arrive 10 or more minutes late for your appointment.  We strive to give you quality time with our providers, and arriving late affects you and other patients whose appointments are after yours.    Again, thank you for choosing [facility].  Our hope is that these requests will decrease the amount of time that you wait before being seen by our physicians.       _____________________________________________________________  Should you have questions after your visit to [facility], please contact our office at [phone] between the hours of 8:30 a.m. and 5:00 p.m.  Voicemails left after 4:30 p.m. will not be returned until the following business day.  For prescription refill requests, have your pharmacy contact our office with your prescription refill request.

## 2014-03-06 LAB — EPSTEIN-BARR VIRUS VCA ANTIBODY PANEL
EBV EA IGG: 14.3 U/mL — AB (ref <9.0)
EBV NA IGG: 188 U/mL — AB (ref <18.0)
EBV VCA IgG: >750 U/mL — ABNORMAL HIGH (ref <18.0)
EBV VCA IgM: <10 U/mL (ref <36.0)

## 2014-03-06 LAB — CMV IGM: CMV IgM: <8 AU/mL (ref <30.00)

## 2014-03-11 LAB — RSV(RESPIRATORY SYNCYTIAL VIRUS) AB, BLOOD

## 2014-03-17 ENCOUNTER — Other Ambulatory Visit (HOSPITAL_COMMUNITY): Payer: 59

## 2014-03-23 ENCOUNTER — Ambulatory Visit (HOSPITAL_COMMUNITY)
Admission: RE | Admit: 2014-03-23 | Discharge: 2014-03-23 | Disposition: A | Discharge status: Home / Self Care | Payer: 59 | Source: Ambulatory Visit | Attending: Hematology and Oncology | Admitting: Hematology and Oncology

## 2014-03-23 DIAGNOSIS — D72819 Decreased white blood cell count, unspecified: Secondary | ICD-10-CM | POA: Diagnosis not present

## 2014-03-23 DIAGNOSIS — K760 Fatty (change of) liver, not elsewhere classified: Secondary | ICD-10-CM

## 2014-03-23 DIAGNOSIS — R161 Splenomegaly, not elsewhere classified: Secondary | ICD-10-CM | POA: Insufficient documentation

## 2014-03-23 DIAGNOSIS — D696 Thrombocytopenia, unspecified: Secondary | ICD-10-CM | POA: Diagnosis not present

## 2014-03-23 DIAGNOSIS — K7689 Other specified diseases of liver: Secondary | ICD-10-CM | POA: Diagnosis present

## 2014-06-08 DIAGNOSIS — T148XXA Other injury of unspecified body region, initial encounter: Secondary | ICD-10-CM

## 2014-06-08 HISTORY — DX: Other injury of unspecified body region, initial encounter: T14.8XXA

## 2014-06-11 ENCOUNTER — Emergency Department (HOSPITAL_COMMUNITY): Payer: 59

## 2014-06-11 ENCOUNTER — Emergency Department (HOSPITAL_COMMUNITY)
Admission: EM | Admit: 2014-06-11 | Discharge: 2014-06-11 | Disposition: A | Discharge status: Home / Self Care | Payer: 59 | Attending: Emergency Medicine | Admitting: Emergency Medicine

## 2014-06-11 ENCOUNTER — Encounter (HOSPITAL_COMMUNITY): Payer: Self-pay | Admitting: Emergency Medicine

## 2014-06-11 DIAGNOSIS — S32010A Wedge compression fracture of first lumbar vertebra, initial encounter for closed fracture: Secondary | ICD-10-CM | POA: Diagnosis not present

## 2014-06-11 DIAGNOSIS — Z792 Long term (current) use of antibiotics: Secondary | ICD-10-CM | POA: Diagnosis not present

## 2014-06-11 DIAGNOSIS — S3992XA Unspecified injury of lower back, initial encounter: Secondary | ICD-10-CM | POA: Diagnosis present

## 2014-06-11 DIAGNOSIS — F329 Major depressive disorder, single episode, unspecified: Secondary | ICD-10-CM | POA: Diagnosis not present

## 2014-06-11 DIAGNOSIS — Y9301 Activity, walking, marching and hiking: Secondary | ICD-10-CM | POA: Diagnosis not present

## 2014-06-11 DIAGNOSIS — Y998 Other external cause status: Secondary | ICD-10-CM | POA: Diagnosis not present

## 2014-06-11 DIAGNOSIS — I10 Essential (primary) hypertension: Secondary | ICD-10-CM | POA: Diagnosis not present

## 2014-06-11 DIAGNOSIS — Z87891 Personal history of nicotine dependence: Secondary | ICD-10-CM | POA: Diagnosis not present

## 2014-06-11 DIAGNOSIS — S32000A Wedge compression fracture of unspecified lumbar vertebra, initial encounter for closed fracture: Secondary | ICD-10-CM

## 2014-06-11 DIAGNOSIS — Z79899 Other long term (current) drug therapy: Secondary | ICD-10-CM | POA: Insufficient documentation

## 2014-06-11 DIAGNOSIS — Y9289 Other specified places as the place of occurrence of the external cause: Secondary | ICD-10-CM | POA: Insufficient documentation

## 2014-06-11 DIAGNOSIS — K219 Gastro-esophageal reflux disease without esophagitis: Secondary | ICD-10-CM | POA: Insufficient documentation

## 2014-06-11 DIAGNOSIS — F419 Anxiety disorder, unspecified: Secondary | ICD-10-CM | POA: Diagnosis not present

## 2014-06-11 DIAGNOSIS — J449 Chronic obstructive pulmonary disease, unspecified: Secondary | ICD-10-CM | POA: Diagnosis not present

## 2014-06-11 DIAGNOSIS — Y9389 Activity, other specified: Secondary | ICD-10-CM | POA: Insufficient documentation

## 2014-06-11 DIAGNOSIS — Z7982 Long term (current) use of aspirin: Secondary | ICD-10-CM | POA: Diagnosis not present

## 2014-06-11 DIAGNOSIS — E785 Hyperlipidemia, unspecified: Secondary | ICD-10-CM | POA: Insufficient documentation

## 2014-06-11 DIAGNOSIS — W108XXA Fall (on) (from) other stairs and steps, initial encounter: Secondary | ICD-10-CM | POA: Insufficient documentation

## 2014-06-11 DIAGNOSIS — Z9889 Other specified postprocedural states: Secondary | ICD-10-CM | POA: Diagnosis not present

## 2014-06-11 DIAGNOSIS — M549 Dorsalgia, unspecified: Secondary | ICD-10-CM

## 2014-06-11 DIAGNOSIS — E119 Type 2 diabetes mellitus without complications: Secondary | ICD-10-CM | POA: Diagnosis not present

## 2014-06-11 MED ORDER — SODIUM CHLORIDE 0.9 % IV BOLUS (SEPSIS)
500.0000 mL | Freq: Once | INTRAVENOUS | Status: AC
  Administered 2014-06-11: 500 mL via INTRAVENOUS

## 2014-06-11 MED ORDER — KETOROLAC TROMETHAMINE 30 MG/ML IJ SOLN
30.0000 mg | Freq: Once | INTRAMUSCULAR | Status: AC
  Administered 2014-06-11: 30 mg via INTRAVENOUS

## 2014-06-11 MED ORDER — MORPHINE SULFATE 4 MG/ML IJ SOLN
4.0000 mg | Freq: Once | INTRAMUSCULAR | Status: AC
  Administered 2014-06-11: 4 mg via INTRAVENOUS

## 2014-06-11 MED ORDER — ONDANSETRON HCL 4 MG/2ML IJ SOLN
4.0000 mg | Freq: Once | INTRAMUSCULAR | Status: AC
  Administered 2014-06-11: 4 mg via INTRAVENOUS

## 2014-06-11 MED ORDER — PROMETHAZINE HCL 25 MG PO TABS: 25.0000 mg | ORAL_TABLET | Freq: Four times a day (QID) | ORAL | Status: DC | PRN

## 2014-06-11 MED ORDER — OXYCODONE-ACETAMINOPHEN 5-325 MG PO TABS: 1.0000 | ORAL_TABLET | ORAL | Status: DC | PRN

## 2014-06-11 NOTE — ED Notes (Signed)
[place] this am at 0800 off back steps.  C/o lower back pain, rates pain 10.  Took hydrocodone one tab without relief.  Vomited x1 en route, Was given Zofran 25m via EMS.  Denies neck pain.

## 2014-06-11 NOTE — Discharge Instructions (Signed)
You have a compression fracture of lumbar vertebrae #1.  Rest, ice pack, back brace. Location for pain and nausea. Recommend follow-up with Dr Aline Brochure.

## 2014-06-11 NOTE — ED Provider Notes (Signed)
CSN: [id number]     Arrival date & time 06/11/14  1354 History  This chart was scribed for Nat Christen, MD by Edison Simon, ED Scribe. This patient was seen in room APA05/APA05 and the patient's care was started at 2:35 PM.    Chief Complaint  Patient presents with  . Fall   The history is provided by the patient and the spouse. No language interpreter was used.    HPI Comments: [name] is a 58 y.o. male who presents to the Emergency Department complaining of low back pain status post accidental fall just PTA. He states he was walking forward up some stairs when his feet slid out from beneath him and he fell backwards down 4 steps. He denies pain to his neck, hips, legs, head, or upper back. He vomited in EMS truck en route here. He reports history of COPD but states he is not on oxygen at home. He states he quit smoking 4 years ago. No bowel or bladder incontinence. Severity is moderate.  Past Medical History  Diagnosis Date  . PONV (postoperative nausea and vomiting)   . Asthma   . Dysrhythmia   . Diabetes mellitus   . GERD (gastroesophageal reflux disease)   . COPD (chronic obstructive pulmonary disease)   . Anxiety   . Depression   . Splenomegaly   . Hypertension   . Dyslipidemia    Past Surgical History  Procedure Laterality Date  . Hernia repair  03/2009  . Vasectomy  1989  . Colonoscopy  08/31/2011    PZW:CHENIDPO rectal and colon polyps-treated. Single tubular adenoma. Next TCS 08/2016  . Esophagogastroduodenoscopy (egd) with esophageal dilation N/A 05/15/2013    EUM:PNTIRW esophagus-status post Venia Minks dilation/Portal gastropathy. Antral erosions-status post biopsy. extrinsic compression along lesser curvature likely secondary to splenomegaly. gastric bx benign  . Cardiac catheterization  10/22/2001    normal coronary arteriea (Dr. Domenic Moras)  . Transthoracic echocardiogram  2011    EF 43-15%, stage 1 diastolic dysfunction, trace TR & pulm valve regurg    Family  History  Problem Relation Age of Onset  . Heart disease Mother   . Arthritis    . Lung disease    . Cancer Mother     lymph nodes  . Asthma    . Diabetes Mother   . Kidney disease    . Ovarian cancer Sister   . Colon cancer Neg Hx   . Diabetes Brother   . Heart disease Brother   . Hypertension Brother   . Diabetes Sister   . Diabetes Sister    History  Substance Use Topics  . Smoking status: Former Smoker    Quit date: 10/06/2011  . Smokeless tobacco: Former Systems developer     Comment: quit x 2 years  . Alcohol Use: No    Review of Systems A complete 10 system review of systems was obtained and all systems are negative except as noted in the HPI and PMH.    Allergies  Codeine and Simvastatin  Home Medications   Prior to Admission medications   Medication Sig Start Date End Date Taking? Authorizing Provider  albuterol (PROVENTIL HFA;VENTOLIN HFA) 108 (90 BASE) MCG/ACT inhaler Inhale 2 puffs into the lungs every 6 (six) hours as needed. For shortness of breath   Yes Historical Provider, MD  albuterol (PROVENTIL) (2.5 MG/3ML) 0.083% nebulizer solution Take 2.5 mg by nebulization 4 (four) times daily.   Yes Historical Provider, MD  ALPRAZolam [name]) 0.25 MG tablet Take  0.25 mg by mouth 2 (two) times daily.    Yes Historical Provider, MD  aspirin EC 81 MG tablet Take 81 mg by mouth daily.   Yes Historical Provider, MD  atorvastatin (LIPITOR) 40 MG tablet Take 1 tablet (40 mg total) by mouth daily. 10/07/13  Yes Pixie Casino, MD  Choline Fenofibrate (FENOFIBRIC ACID) 135 MG CPDR TAKE 1 CAPSULE (135 MG TOTAL) BY MOUTH DAILY.   Yes Pixie Casino, MD  clotrimazole-betamethasone (LOTRISONE) cream Apply 1 application topically 2 (two) times daily.   Yes Historical Provider, MD  diltiazem (DILACOR XR) 240 MG 24 hr capsule Take 1 capsule (240 mg total) by mouth daily. 10/07/13  Yes Pixie Casino, MD  Empagliflozin (JARDIANCE) 10 MG TABS Take 10 mg by mouth daily.   Yes Historical  Provider, MD  HYDROcodone-acetaminophen (NORCO/VICODIN) 5-325 MG per tablet Take 1 tablet by mouth 2 (two) times daily as needed for moderate pain.    Yes Historical Provider, MD  metoCLOPramide (REGLAN) 10 MG tablet Take 10 mg by mouth 4 (four) times daily.   Yes Historical Provider, MD  Multiple Vitamin (MULITIVITAMIN WITH MINERALS) TABS Take 1 tablet by mouth daily.   Yes Historical Provider, MD  pantoprazole (PROTONIX) 40 MG tablet TAKE 1 TABLET (40 MG TOTAL) BY MOUTH DAILY. 02/24/14  Yes Orvil Feil, NP  simethicone (MYLICON) 062 MG chewable tablet Chew 250 mg by mouth every 6 (six) hours as needed for flatulence.   Yes Historical Provider, MD  SitaGLIPtin-MetFORMIN HCl (JANUMET XR) [phone] MG TB24 Take 1 tablet by mouth daily.   Yes Historical Provider, MD  tamsulosin (FLOMAX) 0.4 MG CAPS capsule Take 0.4 mg by mouth daily.   Yes Historical Provider, MD  tiotropium (SPIRIVA) 18 MCG inhalation capsule Place 18 mcg into inhaler and inhale daily.   Yes Historical Provider, MD  levofloxacin (LEVAQUIN) 750 MG tablet Take 1 tablet (750 mg total) by mouth daily. Patient not taking: Reported on 06/11/2014 09/05/13   Nehemiah Settle A Upstill, PA-C  oxyCODONE-acetaminophen (PERCOCET) 5-325 MG per tablet Take 1-2 tablets by mouth every 4 (four) hours as needed. 06/11/14   Nat Christen, MD  promethazine (PHENERGAN) 25 MG tablet Take 1 tablet (25 mg total) by mouth every 6 (six) hours as needed. 06/11/14   Nat Christen, MD   BP 122/84 mmHg  Pulse 91  Temp(Src) 97.8 F (36.6 C) (Oral)  Resp 18  Ht 5' 7"  (1.702 m)  Wt 170 lb (77.111 kg)  BMI 26.62 kg/m2  SpO2 93% Physical Exam  Constitutional: He is oriented to person, place, and time. He appears well-developed and well-nourished.  HENT:  Head: Normocephalic and atraumatic.  Eyes: Conjunctivae and EOM are normal. Pupils are equal, round, and reactive to light.  Neck: Normal range of motion. Neck supple.  Cardiovascular: Normal rate, regular rhythm and normal  heart sounds.   Pulmonary/Chest: Effort normal and breath sounds normal.  Abdominal: Soft. Bowel sounds are normal.  Musculoskeletal: Normal range of motion.  Tender to lumbar spine  Neurological: He is alert and oriented to person, place, and time.  Skin: Skin is warm and dry.  Psychiatric: He has a normal mood and affect. His behavior is normal.  Nursing note and vitals reviewed.   ED Course  Procedures (including critical care time)  DIAGNOSTIC STUDIES: Oxygen Saturation is 95% on [place], normal by my interpretation.    COORDINATION OF CARE: 2:39 PM Discussed treatment plan with patient at beside, the patient agrees with the plan and  has no further questions at this time.   Labs Review Labs Reviewed - No data to display  Imaging Review Dg Lumbar Spine Complete  06/11/2014   CLINICAL DATA:  Back pain, fall, initial encounter  EXAM: LUMBAR SPINE - COMPLETE 4+ VIEW  COMPARISON:  CT abdomen and pelvis 05/28/2013  FINDINGS: Osseous mineralization appears slightly decreased.  Superior endplate compression fracture of L1 vertebral body with approximately 30% anterior height loss.  No definite retropulsion of fragments.  Remaining vertebrae appear normal in height and alignment.  No additional fracture or subluxation.  No bone destruction or spondylolysis.  SI joints preserved.  IMPRESSION: Superior endplate compression fracture of L1 vertebral body with 30% anterior height loss, new 05/28/2013 CT.   Electronically Signed   By: Lavonia Dana M.D.   On: 06/11/2014 15:44   Ct Lumbar Spine Wo Contrast  06/11/2014   CLINICAL DATA:  Lumbar compression fracture. Acute low back pain after falling down steps.  EXAM: CT LUMBAR SPINE WITHOUT CONTRAST  TECHNIQUE: Multidetector CT imaging of the lumbar spine was performed without intravenous contrast administration. Multiplanar CT image reconstructions were also generated.  COMPARISON:  Lumbar radiographs of same day.  FINDINGS: There appears to be a acute  moderate compression fracture involving the L1 vertebral body, predominantly involving the superior endplate. No other fracture or spondylolisthesis is noted. Disc spaces appear to be well maintained. Mild degenerative disc disease is noted anteriorly at L2-3. Posterior facet joints appear normal. There appears to be approximately 50% loss of vertebral body height. There is no significant retropulsion of fracture fragments into the spinal canal.  IMPRESSION: Acute moderate wedge compression fracture is seen involving the L1 vertebral body. No significant retropulsion of fracture fragments into the spinal canal is noted.   Electronically Signed   By: Sabino Dick M.D.   On: 06/11/2014 16:01     EKG Interpretation None      MDM   Final diagnoses:  Back pain  Lumbar compression fracture    Dilatation plain films of lumbar spine and CT of lumbar spine reveal a compression fracture of lumbar #1.  No significant retropulsion of fracture fragments into the spinal canal.  Discussed with patient and his family. Rx Percocet and Phenergan 25 mg. Referral to orthopedics.    Nat Christen, MD 06/11/14 [phone]

## 2014-06-22 ENCOUNTER — Ambulatory Visit (INDEPENDENT_AMBULATORY_CARE_PROVIDER_SITE_OTHER): Payer: 59 | Admitting: Orthopedic Surgery

## 2014-06-22 ENCOUNTER — Encounter: Payer: Self-pay | Admitting: Orthopedic Surgery

## 2014-06-22 VITALS — BP 109/74 | Ht 67.0 in | Wt 170.0 lb

## 2014-06-22 DIAGNOSIS — S32000A Wedge compression fracture of unspecified lumbar vertebra, initial encounter for closed fracture: Secondary | ICD-10-CM

## 2014-06-22 MED ORDER — OXYCODONE-ACETAMINOPHEN 5-325 MG PO TABS: 1.0000 | ORAL_TABLET | ORAL | Status: DC | PRN

## 2014-06-22 NOTE — Progress Notes (Signed)
Patient ID: [name], male   DOB: 1955/09/01, 58 y.o.   MRN: [id number]   Chief Complaint  Patient presents with  . Fracture    er follow up L1 compression fracture, DOI 06/11/14 (FALL)    HPI [name] is a 58 y.o. male.  HPI Date of injury December 4 mechanism fell down 4 steps. Complains of sharp stabbing aching back pain with stiffness which is constant radiates between 3 and 8 out of 10 no radicular symptoms. He had an x-ray and a CT scan which showed 50% loss of height I agree with those reports after reviewing the films  Currently on oxycodone and promethazine he has had a little constipation. He is not braced.  Review of Systems Review of Systems  Systems includes shortness of breath cough wheezing with chronic COPD, irregular heartbeat ankle expelling chronic. Nausea vomiting. Loss of bladder control frequent urination excessive night urination is chronic not acute. Joint pain stiffness back pain noted. Denies numbness tingling burning pain in the legs weakness or tremor. Past Medical History  Diagnosis Date  . PONV (postoperative nausea and vomiting)   . Asthma   . Dysrhythmia   . Diabetes mellitus   . GERD (gastroesophageal reflux disease)   . COPD (chronic obstructive pulmonary disease)   . Anxiety   . Depression   . Splenomegaly   . Hypertension   . Dyslipidemia     Past Surgical History  Procedure Laterality Date  . Hernia repair  03/2009  . Vasectomy  1989  . Colonoscopy  08/31/2011    GUR:[id number] rectal and colon polyps-treated. Single tubular adenoma. Next TCS 08/2016  . Esophagogastroduodenoscopy (egd) with esophageal dilation N/A 05/15/2013    SEG:BTDVVO esophagus-status post Venia Minks dilation/Portal gastropathy. Antral erosions-status post biopsy. extrinsic compression along lesser curvature likely secondary to splenomegaly. gastric bx benign  . Cardiac catheterization  10/22/2001    normal coronary arteriea (Dr. Domenic Moras)  . Transthoracic  echocardiogram  2011    EF 16-07%, stage 1 diastolic dysfunction, trace TR & pulm valve regurg     Family History  Problem Relation Age of Onset  . Heart disease Mother   . Arthritis    . Lung disease    . Cancer Mother     lymph nodes  . Asthma    . Diabetes Mother   . Kidney disease    . Ovarian cancer Sister   . Colon cancer Neg Hx   . Diabetes Brother   . Heart disease Brother   . Hypertension Brother   . Diabetes Sister   . Diabetes Sister     Social History History  Substance Use Topics  . Smoking status: Former Smoker    Quit date: 10/06/2011  . Smokeless tobacco: Former Systems developer     Comment: quit x 2 years  . Alcohol Use: No    Allergies  Allergen Reactions  . Codeine Rash  . Simvastatin Nausea Only    Current Outpatient Prescriptions  Medication Sig Dispense Refill  . albuterol (PROVENTIL HFA;VENTOLIN HFA) 108 (90 BASE) MCG/ACT inhaler Inhale 2 puffs into the lungs every 6 (six) hours as needed. For shortness of breath    . ALPRAZolam (XANAX) 0.25 MG tablet Take 0.25 mg by mouth 2 (two) times daily.     [name] aspirin EC 81 MG tablet Take 81 mg by mouth daily.    [name] atorvastatin (LIPITOR) 40 MG tablet Take 1 tablet (40 mg total) by mouth daily. 90 tablet 3  .  Choline Fenofibrate (FENOFIBRIC ACID) 135 MG CPDR TAKE 1 CAPSULE (135 MG TOTAL) BY MOUTH DAILY. 90 capsule 3  . clotrimazole-betamethasone (LOTRISONE) cream Apply 1 application topically 2 (two) times daily.    [name] diltiazem (DILACOR XR) 240 MG 24 hr capsule Take 1 capsule (240 mg total) by mouth daily. 90 capsule 3  . Empagliflozin (JARDIANCE) 10 MG TABS Take 10 mg by mouth daily.    . Multiple Vitamin (MULITIVITAMIN WITH MINERALS) TABS Take 1 tablet by mouth daily.    [name] oxyCODONE-acetaminophen (PERCOCET) 5-325 MG per tablet Take 1 tablet by mouth every 4 (four) hours as needed. 180 tablet 0  . pantoprazole (PROTONIX) 40 MG tablet TAKE 1 TABLET (40 MG TOTAL) BY MOUTH DAILY. 90 tablet 1  . promethazine  (PHENERGAN) 25 MG tablet Take 1 tablet (25 mg total) by mouth every 6 (six) hours as needed. 15 tablet 0  . SitaGLIPtin-MetFORMIN HCl (JANUMET XR) [phone] MG TB24 Take 1 tablet by mouth daily.    . tamsulosin (FLOMAX) 0.4 MG CAPS capsule Take 0.4 mg by mouth daily.    [name] tiotropium (SPIRIVA) 18 MCG inhalation capsule Place 18 mcg into inhaler and inhale daily.    [name] albuterol (PROVENTIL) (2.5 MG/3ML) 0.083% nebulizer solution Take 2.5 mg by nebulization 4 (four) times daily.    [name] HYDROcodone-acetaminophen (NORCO/VICODIN) 5-325 MG per tablet Take 1 tablet by mouth 2 (two) times daily as needed for moderate pain.     [name] levofloxacin (LEVAQUIN) 750 MG tablet Take 1 tablet (750 mg total) by mouth daily. (Patient not taking: Reported on 06/11/2014) 7 tablet 0  . metoCLOPramide (REGLAN) 10 MG tablet Take 10 mg by mouth 4 (four) times daily.    . simethicone (MYLICON) 045 MG chewable tablet Chew 250 mg by mouth every 6 (six) hours as needed for flatulence.     No current facility-administered medications for this visit.       Physical Exam Blood pressure 109/74, height 5' 7"  (1.702 m), weight 170 lb (77.111 kg). Physical Exam Gen. appearance normal grooming. He is oriented 3. Mood is flat affect is flat  Gait is notable for slow cadence.  Tenderness noted in the L1-L2 and T12 areas of the lumbar spine O gross deformity clinically. His lower extremities show full range of motion stability normal strength skin intact good distal pulses normal distal sensation 2+ reflexes  This note is symmetric  Data Reviewed CT scan x-ray in dependent interpretation L1 compression fracture 50% loss of height  Assessment    Encounter Diagnosis  Name Primary?  . Lumbar compression fracture, closed, initial encounter Yes        Plan    [employer] will give him a TLSO brace Continue Percocet for pain X-ray in 6 weeks

## 2014-06-28 ENCOUNTER — Telehealth: Payer: Self-pay | Admitting: Orthopedic Surgery

## 2014-06-29 ENCOUNTER — Other Ambulatory Visit: Payer: Self-pay | Admitting: *Deleted

## 2014-06-29 MED ORDER — PROMETHAZINE HCL 25 MG PO TABS: 25.0000 mg | ORAL_TABLET | Freq: Four times a day (QID) | ORAL | Status: DC | PRN

## 2014-06-29 NOTE — Telephone Encounter (Signed)
PATIENT IS CALLING ASKING FOR A REFILL ON THE NAUSEA MEDICATION THAT WAS PRESCRIBED TO HIM THROUGH THE ER promethazine (PHENERGAN) 25 MG tablet PLEASE ADVISE?

## 2014-06-29 NOTE — Telephone Encounter (Signed)
Ok to refill 

## 2014-06-29 NOTE — Telephone Encounter (Signed)
refilled 

## 2014-06-29 NOTE — Telephone Encounter (Signed)
Approved.  

## 2014-08-03 ENCOUNTER — Ambulatory Visit (INDEPENDENT_AMBULATORY_CARE_PROVIDER_SITE_OTHER): Payer: Self-pay | Admitting: Orthopedic Surgery

## 2014-08-03 ENCOUNTER — Ambulatory Visit (INDEPENDENT_AMBULATORY_CARE_PROVIDER_SITE_OTHER): Payer: 59

## 2014-08-03 VITALS — Ht 67.0 in | Wt 170.0 lb

## 2014-08-03 DIAGNOSIS — S32009D Unspecified fracture of unspecified lumbar vertebra, subsequent encounter for fracture with routine healing: Secondary | ICD-10-CM

## 2014-08-03 NOTE — Progress Notes (Signed)
Chief Complaint  Patient presents with  . Follow-up    6 week follow up + xray l spine fx, DOI 06/11/14    Lumbar spine fracture #1 vertebrae patient in a back brace. Currently on hydrocodone. He's been in a brace for 6 weeks. He has no neurologic symptoms  X-rays today show a fairly significantly compression fracture.  Continue brace 4 weeks a stone patient's exam today which revealed palpable tenderness over the fracture site. Neurologically he was intact  No x-ray needed next visit

## 2014-08-03 NOTE — Patient Instructions (Signed)
Brace 4 more weeks

## 2014-08-31 ENCOUNTER — Encounter: Payer: Self-pay | Admitting: Orthopedic Surgery

## 2014-08-31 ENCOUNTER — Ambulatory Visit (INDEPENDENT_AMBULATORY_CARE_PROVIDER_SITE_OTHER): Payer: Self-pay | Admitting: Orthopedic Surgery

## 2014-08-31 VITALS — BP 111/73 | Ht 67.0 in | Wt 170.0 lb

## 2014-08-31 DIAGNOSIS — S32009D Unspecified fracture of unspecified lumbar vertebra, subsequent encounter for fracture with routine healing: Secondary | ICD-10-CM

## 2014-08-31 MED ORDER — HYDROCODONE-ACETAMINOPHEN 5-325 MG PO TABS: 1.0000 | ORAL_TABLET | Freq: Three times a day (TID) | ORAL | Status: DC | PRN

## 2014-08-31 NOTE — Progress Notes (Signed)
Chief Complaint  Patient presents with  . Follow-up    follow up l spine fx, DOI 06/11/14   Encounter Diagnosis  Name Primary?  . Fracture of lumbar spine, with routine healing, subsequent encounter Yes    follow-up lumbar spine fracture patient in brace. Patient like Norco instead of Percocet. He still has a little tenderness but he says is able to do a little bit of activities at home and then he gets tired and feels some pain then he'll stop  No real pain at rest has some pain when he is getting up in the morning  He remains neurovascularly intact with mild tenderness at the fracture site   brace 2 more weeks then wear 8 hours a day come back for x-ray lumbar spine  Meds ordered this encounter  Medications  . HYDROcodone-acetaminophen (NORCO/VICODIN) 5-325 MG per tablet    Sig: Take 1 tablet by mouth every 8 (eight) hours as needed for moderate pain.    Dispense:  90 tablet    Refill:  0

## 2014-09-01 ENCOUNTER — Other Ambulatory Visit: Payer: Self-pay

## 2014-09-01 MED ORDER — PANTOPRAZOLE SODIUM 40 MG PO TBEC: 40.0000 mg | DELAYED_RELEASE_TABLET | Freq: Every day | ORAL | Status: DC

## 2014-09-05 DIAGNOSIS — E8809 Other disorders of plasma-protein metabolism, not elsewhere classified: Secondary | ICD-10-CM | POA: Insufficient documentation

## 2014-09-05 NOTE — Assessment & Plan Note (Addendum)
Increasing over the last two lab appointments (prior to today).  WNL in October 2014.  Needs further evaluation and work-up for multiple myeloma given thrombocytopenia.  Labs today: CBC diff, CMET, LDH, B2M, MM panel, UPEP with IFE, urine protein, and urine creatinine clearance.  No clear evidence of CRAB findings, but calcium is upper limits of normal on previous lab appointment.  Return in 3-4 weeks for follow-up to review data.  If work-up is negative, 3 weeks appointment can be cancelled with patient's permission and follow-up with repeat las in 6 months time.

## 2014-09-05 NOTE — Progress Notes (Signed)
Alonza Bogus, MD [address] [place] [address]  Hyperproteinemia - Plan: CBC with Differential, Comprehensive metabolic panel, Lactate dehydrogenase, Beta 2 microglobuline, serum, Protein electrophoresis, urine, Protein, urine, 24 hour, Creatinine clearance, urine, 24 hour, Immunofixation, urine, Multiple myeloma panel, serum, Kappa/lambda light chains, Multiple myeloma panel, serum, Kappa/lambda light chains, CBC with Differential, Lactate dehydrogenase, Comprehensive metabolic panel, Protein electrophoresis, urine, Protein, urine, 24 hour, Creatinine clearance, urine, 24 hour, Immunofixation, urine  Thrombocytopenia - Plan: CBC with Differential, Comprehensive metabolic panel, Lactate dehydrogenase, Beta 2 microglobuline, serum, Protein electrophoresis, urine, Protein, urine, 24 hour, Creatinine clearance, urine, 24 hour, Immunofixation, urine, Multiple myeloma panel, serum, Kappa/lambda light chains, Multiple myeloma panel, serum, Kappa/lambda light chains, CBC with Differential, Lactate dehydrogenase, Comprehensive metabolic panel, Protein electrophoresis, urine, Protein, urine, 24 hour, Creatinine clearance, urine, 24 hour, Immunofixation, urine  Leukopenia - Plan: CBC with Differential, Comprehensive metabolic panel, Lactate dehydrogenase, Beta 2 microglobuline, serum, Protein electrophoresis, urine, Protein, urine, 24 hour, Creatinine clearance, urine, 24 hour, Immunofixation, urine, Multiple myeloma panel, serum, Kappa/lambda light chains, Multiple myeloma panel, serum, Kappa/lambda light chains, CBC with Differential, Lactate dehydrogenase, Comprehensive metabolic panel  CURRENT THERAPY: Observation  INTERVAL HISTORY: IRIE FIORELLO 59 y.o. male returns for followup of splenomegaly with mild thrombocytopenia in the setting of diabetes and intestinal paresis. Demonstrated cholelithiasis in the past along with fatty liver by ultrasound.   I personally  reviewed and went over laboratory results with the patient.  The results are noted within this dictation.  I personally reviewed and went over radiographic studies with the patient.  The results are noted within this dictation.    "I don't know why I am here today."  He complains that he has never received feedback about any lab tests or results.  Unfortunately, I had to spend time today reviewing everything with the patient that was not previously reviewed with him with the past oncologist.  He is educated about his leukopenia, which has been WNL over the past 12 months, mild thrombocytopenia, fatty infiltration of liver, and splenomegaly.  He is appreciative of the information and I answered all questions regarding this.  Of note, his last 2 lab tests, prior to today, reveal a hyperproteinemia which has not been evaluated.  He has no obvious CRAB findings, but his Calcium was 10.5 6 months ago with a normal albumin.  With these findings, he deserves a work-up for multiple myeloma.  Therefore, additional lab tests were ordered today with urine testing of 24 hour collection.  He is agreeable to this plan.  Hematologically, he denies any complaints and ROS questioning is negative.   He fell in December 2015 fracturing back.  He is in a back brace presently.   Past Medical History  Diagnosis Date  . PONV (postoperative nausea and vomiting)   . Asthma   . Dysrhythmia   . Diabetes mellitus   . GERD (gastroesophageal reflux disease)   . COPD (chronic obstructive pulmonary disease)   . Anxiety   . Depression   . Splenomegaly   . Hypertension   . Dyslipidemia     has Bilateral leg pain; Back pain with radiation; DDD (degenerative disc disease), lumbar; Lumbago; Muscle weakness (generalized); Abdominal bloating; Abdominal pain, generalized; Non-alcoholic fatty liver disease; Splenomegaly; Lower abdominal pain; Abnormal LFTs; GERD (gastroesophageal reflux disease); Unspecified constipation;  Leukopenia; Thrombocytopenia; HTN (hypertension); Dyslipidemia; DM2 (diabetes mellitus, type 2); and Hyperproteinemia on his problem list.     is allergic to  codeine and simvastatin.  Mr. Krolak had no medications administered during this visit.  Past Surgical History  Procedure Laterality Date  . Hernia repair  03/2009  . Vasectomy  1989  . Colonoscopy  08/31/2011    [id number] rectal and colon polyps-treated. Single tubular adenoma. Next TCS 08/2016  . Esophagogastroduodenoscopy (egd) with esophageal dilation N/A 05/15/2013    [id number] esophagus-status post Venia Minks dilation/Portal gastropathy. Antral erosions-status post biopsy. extrinsic compression along lesser curvature likely secondary to splenomegaly. gastric bx benign  . Cardiac catheterization  10/22/2001    normal coronary arteriea (Dr. Domenic Moras)  . Transthoracic echocardiogram  2011    EF 89-37%, stage 1 diastolic dysfunction, trace TR & pulm valve regurg     Denies any headaches, dizziness, double vision, fevers, chills, night sweats, nausea, vomiting, diarrhea, constipation, chest pain, heart palpitations, shortness of breath, blood in stool, black tarry stool, urinary pain, urinary burning, urinary frequency, hematuria.   PHYSICAL EXAMINATION  ECOG PERFORMANCE STATUS: 0 - Asymptomatic  Filed Vitals:   09/06/14 1110  BP: 130/81  Pulse: 89  Temp: 97.8 F (36.6 C)  Resp: 16    GENERAL:alert, no distress, well nourished, well developed, comfortable, cooperative, smiling and wearing a back brace SKIN: skin color, texture, turgor are normal, no rashes or significant lesions HEAD: Normocephalic, No masses, lesions, tenderness or abnormalities EYES: normal, PERRLA, EOMI, Conjunctiva are pink and non-injected EARS: External ears normal OROPHARYNX:lips, buccal mucosa, and tongue normal and mucous membranes are moist  NECK: supple, trachea midline LYMPH:  not examined BREAST:not examined ABDOMEN:obese BACK: in back  brace EXTREMITIES:less then 2 second capillary refill, no skin discoloration, no cyanosis  NEURO: alert & oriented x 3 with fluent speech, no focal motor/sensory deficits   LABORATORY DATA: CBC    Component Value Date/Time   WBC 4.8 09/06/2014 1106   RBC 6.17* 09/06/2014 1106   RBC 5.31 08/12/2013 1449   HGB 16.8 09/06/2014 1106   HCT 50.1 09/06/2014 1106   PLT 108* 09/06/2014 1106   MCV 81.2 09/06/2014 1106   MCH 27.2 09/06/2014 1106   MCHC 33.5 09/06/2014 1106   RDW 14.8 09/06/2014 1106   LYMPHSABS 1.1 09/06/2014 1106   MONOABS 0.3 09/06/2014 1106   EOSABS 0.3 09/06/2014 1106   BASOSABS 0.0 09/06/2014 1106      Chemistry      Component Value Date/Time   NA 134* 09/06/2014 1106   K 4.4 09/06/2014 1106   CL 107 09/06/2014 1106   CO2 24 09/06/2014 1106   BUN 21 09/06/2014 1106   CREATININE 1.07 09/06/2014 1106   CREATININE 0.86 04/24/2013 0756      Component Value Date/Time   CALCIUM 9.7 09/06/2014 1106   ALKPHOS 58 09/06/2014 1106   ALKPHOS 41 06/22/2011   AST 36 09/06/2014 1106   AST 22 06/22/2011   ALT 62* 09/06/2014 1106   BILITOT 0.7 09/06/2014 1106   BILITOT 0.5 06/22/2011      ASSESSMENT AND PLAN:  Thrombocytopenia Secondary to splenomegaly and fatty infiltration of liver. Labs today: CBC diff, CMET, LDH.  Stable and remaining near the 100,000 range. No bleeding episodes.   Leukopenia Secondary to fatty infiltration of liver.  Labs today: CBC diff, CMET, LDH.  WNL x 12 months.   Hyperproteinemia Increasing over the last two lab appointments (prior to today).  WNL in October 2014.  Needs further evaluation and work-up for multiple myeloma given thrombocytopenia.  Labs today: CBC diff, CMET, LDH, B2M, MM panel, UPEP with IFE, urine protein,  and urine creatinine clearance.  No clear evidence of CRAB findings, but calcium is upper limits of normal on previous lab appointment.  Return in 3-4 weeks for follow-up to review data.  If work-up is negative, 3  weeks appointment can be cancelled with patient's permission and follow-up with repeat las in 6 months time.      THERAPY PLAN:  Work-up for hyperproteinemia in the setting of leukopenia and thrombocytopenia. Return in 3-4 weeks for follow-up, but this appointment may be cancelled if work-up is negative.  If work-up is negative, then return to biannual labs and follow-up for 1-2 more years and then consider release from [facility] if labs demonstrate stability.  All questions were answered. The patient knows to call the clinic with any problems, questions or concerns. We can certainly see the patient much sooner if necessary.  Patient and plan discussed with Dr. Ancil Linsey and she is in agreement with the aforementioned.   This note is electronically signed by: Robynn Pane 09/06/2014 12:23 PM

## 2014-09-05 NOTE — Assessment & Plan Note (Addendum)
Secondary to fatty infiltration of liver.  Labs today: CBC diff, CMET, LDH.  WNL x 12 months.

## 2014-09-05 NOTE — Assessment & Plan Note (Addendum)
Secondary to splenomegaly and fatty infiltration of liver. Labs today: CBC diff, CMET, LDH.  Stable and remaining near the 100,000 range. No bleeding episodes.

## 2014-09-06 ENCOUNTER — Encounter (HOSPITAL_COMMUNITY): Payer: 59 | Attending: Oncology | Admitting: Oncology

## 2014-09-06 ENCOUNTER — Encounter (HOSPITAL_BASED_OUTPATIENT_CLINIC_OR_DEPARTMENT_OTHER): Payer: 59

## 2014-09-06 VITALS — BP 130/81 | HR 89 | Temp 97.8°F | Resp 16 | Wt 164.3 lb

## 2014-09-06 DIAGNOSIS — D696 Thrombocytopenia, unspecified: Secondary | ICD-10-CM | POA: Diagnosis not present

## 2014-09-06 DIAGNOSIS — D72819 Decreased white blood cell count, unspecified: Secondary | ICD-10-CM | POA: Insufficient documentation

## 2014-09-06 DIAGNOSIS — E8809 Other disorders of plasma-protein metabolism, not elsewhere classified: Secondary | ICD-10-CM | POA: Diagnosis present

## 2014-09-06 LAB — CBC WITH DIFFERENTIAL/PLATELET
BASOS ABS: 0 10*3/uL (ref 0.0–0.1)
BASOS PCT: 1 % (ref 0–1)
EOS ABS: 0.3 10*3/uL (ref 0.0–0.7)
Eosinophils Relative: 6 % — ABNORMAL HIGH (ref 0–5)
HCT: 50.1 % (ref 39.0–52.0)
Hemoglobin: 16.8 g/dL (ref 13.0–17.0)
Lymphocytes Relative: 23 % (ref 12–46)
Lymphs Abs: 1.1 10*3/uL (ref 0.7–4.0)
MCH: 27.2 pg (ref 26.0–34.0)
MCHC: 33.5 g/dL (ref 30.0–36.0)
MCV: 81.2 fL (ref 78.0–100.0)
MONOS PCT: 7 % (ref 3–12)
Monocytes Absolute: 0.3 10*3/uL (ref 0.1–1.0)
NEUTROS ABS: 3 10*3/uL (ref 1.7–7.7)
Neutrophils Relative %: 64 % (ref 43–77)
Platelets: 108 10*3/uL — ABNORMAL LOW (ref 150–400)
RBC: 6.17 MIL/uL — ABNORMAL HIGH (ref 4.22–5.81)
RDW: 14.8 % (ref 11.5–15.5)
WBC: 4.8 10*3/uL (ref 4.0–10.5)

## 2014-09-06 LAB — COMPREHENSIVE METABOLIC PANEL
ALBUMIN: 4.8 g/dL (ref 3.5–5.2)
ALT: 62 U/L — AB (ref 0–53)
ANION GAP: 3 — AB (ref 5–15)
AST: 36 U/L (ref 0–37)
Alkaline Phosphatase: 58 U/L (ref 39–117)
BUN: 21 mg/dL (ref 6–23)
CALCIUM: 9.7 mg/dL (ref 8.4–10.5)
CO2: 24 mmol/L (ref 19–32)
Chloride: 107 mmol/L (ref 96–112)
Creatinine, Ser: 1.07 mg/dL (ref 0.50–1.35)
GFR calc Af Amer: 86 mL/min — ABNORMAL LOW (ref >90)
GFR, EST NON AFRICAN AMERICAN: 74 mL/min — AB (ref >90)
Glucose, Bld: 168 mg/dL — ABNORMAL HIGH (ref 70–99)
Potassium: 4.4 mmol/L (ref 3.5–5.1)
Sodium: 134 mmol/L — ABNORMAL LOW (ref 135–145)
TOTAL PROTEIN: 8.8 g/dL — AB (ref 6.0–8.3)
Total Bilirubin: 0.7 mg/dL (ref 0.3–1.2)

## 2014-09-06 LAB — LACTATE DEHYDROGENASE: LDH: 127 U/L (ref 94–250)

## 2014-09-06 NOTE — Progress Notes (Signed)
LABS FOR B2M,CMP,CBCD,LDH,ADDED KLLC,MM

## 2014-09-06 NOTE — Patient Instructions (Addendum)
[place] at [facility]  Discharge Instructions:  We will perform some extra blood and urine testing since your protein level in your blood is elevated.  Your other lab work is very stable.   We have been seeing you for a low white blood cell count which for the past year has been normal.  We also have been following you for a mildly low platelet count which is at a very safe level and probably related to your enlarged spleen. You will collect your urine for 24 hours and return it to [place] this week for further testing. Return in 3 weeks for follow-up and to review new data.  If all is normal, then we can cancel your three week appointment and you will return in 6 months for follow-up.  24-Hour Urine Collection HOME CARE  When you get up in the morning on the day you do this test, pee (urinate) in the toilet and flush. Make a note of the time. This will be your start time on the day of collection and the end time on the next morning.  From then on, save all your pee (urine) in the plastic jug that was given to you.  You should stop collecting your pee 24 hours after you started.  If the plastic jug that is given to you already has liquid in it, that is okay. Do not throw out the liquid or rinse out the jug. Some tests need the liquid to be added to your pee.  Keep your plastic jug cool (in an ice chest or the refrigerator) during the test.  When the 24 hours is over, bring your plastic jug to the clinic lab. Keep the jug cool (in an ice chest) while you are bringing it to the lab. Document Released: 09/21/2008 Document Revised: 09/17/2011 Document Reviewed: 09/21/2008 [facility] Patient Information 2015 [place], [place]. This information is not intended to replace advice given to you by your health care provider. Make sure you discuss any questions you have with your health care provider.  _______________________________________________________________  Thank you for  choosing [place] at [facility] to provide your oncology and hematology care.  To afford each patient quality time with our providers, please arrive at least 15 minutes before your scheduled appointment.  You need to re-schedule your appointment if you arrive 10 or more minutes late.  We strive to give you quality time with our providers, and arriving late affects you and other patients whose appointments are after yours.  Also, if you no show three or more times for appointments you may be dismissed from the clinic.  Again, thank you for choosing [place] at [facility] hope is that these requests will allow you access to exceptional care and in a timely manner. _______________________________________________________________  If you have questions after your visit, please contact our office at [phone] between the hours of 8:30 a.m. and 5:00 p.m. Voicemails left after 4:30 p.m. will not be returned until the following business day. _______________________________________________________________  For prescription refill requests, have your pharmacy contact our office. _______________________________________________________________  Recommendations made by the consultant and any test results will be sent to your referring physician. _______________________________________________________________

## 2014-09-07 LAB — KAPPA/LAMBDA LIGHT CHAINS
KAPPA FREE LGHT CHN: 44.72 mg/L — AB (ref 3.30–19.40)
Kappa, lambda light chain ratio: 1.53 (ref 0.26–1.65)
LAMDA FREE LIGHT CHAINS: 29.28 mg/L — AB (ref 5.71–26.30)

## 2014-09-07 LAB — BETA 2 MICROGLOBULIN, SERUM: Beta-2 Microglobulin: 2.9 mg/L — ABNORMAL HIGH (ref 0.6–2.4)

## 2014-09-09 ENCOUNTER — Encounter (HOSPITAL_COMMUNITY): Payer: 59 | Attending: Hematology & Oncology

## 2014-09-09 DIAGNOSIS — E8809 Other disorders of plasma-protein metabolism, not elsewhere classified: Secondary | ICD-10-CM | POA: Insufficient documentation

## 2014-09-09 DIAGNOSIS — D696 Thrombocytopenia, unspecified: Secondary | ICD-10-CM | POA: Insufficient documentation

## 2014-09-09 DIAGNOSIS — D72819 Decreased white blood cell count, unspecified: Secondary | ICD-10-CM | POA: Insufficient documentation

## 2014-09-09 LAB — PROTEIN, URINE, 24 HOUR
Collection Interval-UPROT: 24 hours
Protein, Urine: <6 mg/dL
Urine Total Volume-UPROT: 2300 mL

## 2014-09-09 LAB — MULTIPLE MYELOMA PANEL, SERUM
ALBUMIN ELP: 54 % — AB (ref 55.8–66.1)
ALPHA-1-GLOBULIN: 3.9 % (ref 2.9–4.9)
Alpha-2-Globulin: 10 % (ref 7.1–11.8)
BETA GLOBULIN: 5.7 % (ref 4.7–7.2)
Beta 2: 3.4 % (ref 3.2–6.5)
Gamma Globulin: 23 % — ABNORMAL HIGH (ref 11.1–18.8)
IGA: 12 mg/dL — AB (ref 68–379)
IGM, SERUM: 166 mg/dL (ref 41–251)
IgG (Immunoglobin G), Serum: 2200 mg/dL — ABNORMAL HIGH (ref 650–1600)
M-Spike, %: NOT DETECTED g/dL
TOTAL PROTEIN: 8.6 g/dL — AB (ref 6.0–8.3)

## 2014-09-09 NOTE — Progress Notes (Addendum)
24 HR FOR UPEP,UPRO,CRCL,UFIX VOL 2300

## 2014-09-09 NOTE — Addendum Note (Signed)
Addended by: Berneta Levins on: 09/09/2014 11:31 AM   Modules accepted: Orders

## 2014-09-10 LAB — CREATININE CLEARANCE, URINE, 24 HOUR
CREAT CLEAR: 52 mL/min — AB (ref 75–125)
Collection Interval-CRCL: 24 hours
Creatinine, 24H Ur: 911 mg/d (ref 800–2000)
Creatinine, Urine: 68.1 mg/dL
Urine Total Volume-CRCL: 1339 mL

## 2014-09-13 LAB — UIFE/LIGHT CHAINS/TP QN, 24-HR UR
Albumin, U: DETECTED
Alpha 1, Urine: DETECTED — AB
Alpha 2, Urine: DETECTED — AB
Beta, Urine: DETECTED — AB
Gamma Globulin, Urine: DETECTED — AB
TIME-UPE24: 24 h
TOTAL PROTEIN, URINE-UPE24: 5 mg/dL (ref 5–25)
TOTAL PROTEIN, URINE-UR/DAY: 115 mg/d (ref <150)
Volume, Urine: 2300 mL

## 2014-09-13 LAB — IMMUNOFIXATION, URINE

## 2014-09-21 ENCOUNTER — Ambulatory Visit (INDEPENDENT_AMBULATORY_CARE_PROVIDER_SITE_OTHER): Payer: 59 | Admitting: Orthopedic Surgery

## 2014-09-21 ENCOUNTER — Ambulatory Visit (INDEPENDENT_AMBULATORY_CARE_PROVIDER_SITE_OTHER): Payer: 59

## 2014-09-21 VITALS — BP 100/71 | Ht 67.0 in | Wt 164.3 lb

## 2014-09-21 DIAGNOSIS — S32009D Unspecified fracture of unspecified lumbar vertebra, subsequent encounter for fracture with routine healing: Secondary | ICD-10-CM

## 2014-09-21 DIAGNOSIS — M7551 Bursitis of right shoulder: Secondary | ICD-10-CM

## 2014-09-21 NOTE — Progress Notes (Signed)
This is a follow-up visit for a fracture of the lumbar spine the patient wore brace for 12 weeks she's here for a follow-up x-rays x-ray shows that his fracture is stable has not collapsed any further although was a pretty significant injury he remained neurovascularly intact now complains of shoulder discomfort on the left and some mild back discomfort if he jars himself or picks up something heavy  He has no weakness no gait disturbance  He has no tenderness in his back he does have some decreased range of motion in the left shoulder but no instability and normal strength. Vitals are stable he is oriented 3 mood and affect are normal appearance is normal as well  Impression lumbar spine fracture healed stable remove brace  Left shoulder stiffness pain  Recommend shoulder exercises for 4 weeks if he does not improve then ran phone call for formal evaluation of his left shoulder bursitis

## 2014-09-27 ENCOUNTER — Ambulatory Visit (HOSPITAL_COMMUNITY): Payer: 59 | Admitting: Oncology

## 2014-09-27 NOTE — Assessment & Plan Note (Signed)
Negative work-up recently.  SPEP and IFE are unimpressive without an M-Spike and

## 2014-09-27 NOTE — Progress Notes (Signed)
-  Rescheduled-  [name]

## 2014-09-30 NOTE — Assessment & Plan Note (Addendum)
Negative work-up recently.  SPEP and IFE are unimpressive without an M-Spike and UPEP is negative for Bence Jones Proteinuria.  No further work-up required at this time. Return as scheduled in 5-6 months for repeat labs and follow-up appointment.

## 2014-09-30 NOTE — Progress Notes (Signed)
Christopher Bogus, MD [address] [place] [address]  Hyperproteinemia  CURRENT THERAPY: Observation  INTERVAL HISTORY: [name] 59 y.o. male returns for followup of splenomegaly with mild thrombocytopenia in the setting of diabetes and intestinal paresis. [place] in past demonstrated cholelithiasis along with fatty liver by ultrasound.    I personally reviewed and went over laboratory results with the patient.  The results are noted within this dictation.  His screening multiple myeloma work-up for hyperproteinemia is negative.  No further work-up needed at this time.  He was given a copy of his labs.  Hematologically, he denies any complaints and ROS questioning is negative.  He no longer is wearing a back brace.  He reports that his back is tender but much improved.  Past Medical History  Diagnosis Date  . PONV (postoperative nausea and vomiting)   . Asthma   . Dysrhythmia   . Diabetes mellitus   . GERD (gastroesophageal reflux disease)   . COPD (chronic obstructive pulmonary disease)   . Anxiety   . Depression   . Splenomegaly   . Hypertension   . Dyslipidemia   . Fracture 12/15    lower back "L-1"    has Bilateral leg pain; Back pain with radiation; DDD (degenerative disc disease), lumbar; Lumbago; Muscle weakness (generalized); Abdominal bloating; Abdominal pain, generalized; Non-alcoholic fatty liver disease; Splenomegaly; Lower abdominal pain; Abnormal LFTs; GERD (gastroesophageal reflux disease); Unspecified constipation; Leukopenia; Thrombocytopenia; HTN (hypertension); Dyslipidemia; DM2 (diabetes mellitus, type 2); and Hyperproteinemia on his problem list.     is allergic to codeine and simvastatin.  [name] does not currently have medications on file.  Past Surgical History  Procedure Laterality Date  . Hernia repair  03/2009  . Vasectomy  1989  . Colonoscopy  08/31/2011    [id number] rectal and colon polyps-treated. Single  tubular adenoma. Next TCS 08/2016  . Esophagogastroduodenoscopy (egd) with esophageal dilation N/A 05/15/2013    [id number] esophagus-status post Venia Minks dilation/Portal gastropathy. Antral erosions-status post biopsy. extrinsic compression along lesser curvature likely secondary to splenomegaly. gastric bx benign  . Cardiac catheterization  10/22/2001    normal coronary arteriea (Dr. Domenic Moras)  . Transthoracic echocardiogram  2011    EF 83-77%, stage 1 diastolic dysfunction, trace TR & pulm valve regurg     Denies any headaches, dizziness, double vision, fevers, chills, night sweats, nausea, vomiting, diarrhea, constipation, chest pain, heart palpitations, shortness of breath, blood in stool, black tarry stool, urinary pain, urinary burning, urinary frequency, hematuria.   PHYSICAL EXAMINATION  ECOG PERFORMANCE STATUS: 0 - Asymptomatic  Filed Vitals:   10/05/14 1141  BP: 113/70  Pulse: 102  Temp: 98.2 F (36.8 C)  Resp: 18    GENERAL:alert, no distress, well nourished, well developed, comfortable, cooperative and smiling SKIN: skin color, texture, turgor are normal, no rashes or significant lesions HEAD: Normocephalic, No masses, lesions, tenderness or abnormalities EYES: normal, PERRLA, EOMI, Conjunctiva are pink and non-injected EARS: External ears normal OROPHARYNX:lips, buccal mucosa, and tongue normal and mucous membranes are moist  NECK: supple, trachea midline LYMPH:  not examined BREAST:not examined LUNGS: not examined HEART: not examined ABDOMEN:not examined BACK: Back symmetric, no curvature. EXTREMITIES:less then 2 second capillary refill, no skin discoloration, no cyanosis  NEURO: alert & oriented x 3 with fluent speech, no focal motor/sensory deficits   LABORATORY DATA: CBC    Component Value Date/Time   WBC 4.8 09/06/2014 1106   RBC 6.17* 09/06/2014 1106  RBC 5.31 08/12/2013 1449   HGB 16.8 09/06/2014 1106   HCT 50.1 09/06/2014 1106   PLT 108*  09/06/2014 1106   MCV 81.2 09/06/2014 1106   MCH 27.2 09/06/2014 1106   MCHC 33.5 09/06/2014 1106   RDW 14.8 09/06/2014 1106   LYMPHSABS 1.1 09/06/2014 1106   MONOABS 0.3 09/06/2014 1106   EOSABS 0.3 09/06/2014 1106   BASOSABS 0.0 09/06/2014 1106      Chemistry      Component Value Date/Time   NA 134* 09/06/2014 1106   K 4.4 09/06/2014 1106   CL 107 09/06/2014 1106   CO2 24 09/06/2014 1106   BUN 21 09/06/2014 1106   CREATININE 1.07 09/06/2014 1106   CREATININE 0.86 04/24/2013 0756      Component Value Date/Time   CALCIUM 9.7 09/06/2014 1106   ALKPHOS 58 09/06/2014 1106   ALKPHOS 41 06/22/2011   AST 36 09/06/2014 1106   AST 22 06/22/2011   ALT 62* 09/06/2014 1106   BILITOT 0.7 09/06/2014 1106   BILITOT 0.5 06/22/2011     Lab Results  Component Value Date   PROT 8.6* 09/06/2014   ALBUMINELP 54.0* 09/06/2014   A1GS 3.9 09/06/2014   A2GS 10.0 09/06/2014   BETS 5.7 09/06/2014   BETA2SER 3.4 09/06/2014   GAMS 23.0* 09/06/2014   MSPIKE NOT DETECTED 09/06/2014   SPEI  09/06/2014    Nonspecific diffuse polyclonal type increase in gamma globulins.   SPECOM (NOTE) 09/06/2014   IGGSERUM 2200* 09/06/2014   IGA 12* 09/06/2014   IGMSERUM 166 09/06/2014   IMMELINT (NOTE) 09/06/2014      RADIOGRAPHIC STUDIES:  Dg Lumbar Spine 2-3 Views  09/21/2014   Radiology report AP lateral and spot film of the lumbar spine  Wedge compression fracture L1 stable no change in position  Sclerotic bone around the fracture fragments  No retropulsion  Impression healed lumbar vertebra #1 fracture      ASSESSMENT AND PLAN:  Hyperproteinemia Negative work-up recently.  SPEP and IFE are unimpressive without an M-Spike and UPEP is negative for Bence Jones Proteinuria.  No further work-up required at this time. Return as scheduled in 5-6 months for repeat labs and follow-up appointment.   THERAPY PLAN:  Continue to monitor counts.  No intervention needed at this time.  All questions were  answered. The patient knows to call the clinic with any problems, questions or concerns. We can certainly see the patient much sooner if necessary.  Patient and plan discussed with Dr. Ancil Linsey and she is in agreement with the aforementioned.   This note is electronically signed by: Robynn Pane 10/05/2014 12:16 PM

## 2014-10-05 ENCOUNTER — Encounter (HOSPITAL_BASED_OUTPATIENT_CLINIC_OR_DEPARTMENT_OTHER): Payer: 59 | Admitting: Oncology

## 2014-10-05 ENCOUNTER — Encounter (HOSPITAL_COMMUNITY): Payer: Self-pay | Admitting: Oncology

## 2014-10-05 VITALS — BP 113/70 | HR 102 | Temp 98.2°F | Resp 18 | Wt 165.2 lb

## 2014-10-05 DIAGNOSIS — E8809 Other disorders of plasma-protein metabolism, not elsewhere classified: Secondary | ICD-10-CM

## 2014-10-05 NOTE — Patient Instructions (Signed)
[place] at [facility] Discharge Instructions  RECOMMENDATIONS MADE BY THE CONSULTANT AND ANY TEST RESULTS WILL BE SENT TO YOUR REFERRING PHYSICIAN.  Exam and discussion by Robynn Pane, PA-C Call with any questions or concerns.  Follow-up as scheduled.  Thank you for choosing [place] at [facility] to provide your oncology and hematology care.  To afford each patient quality time with our provider, please arrive at least 15 minutes before your scheduled appointment time.    You need to re-schedule your appointment should you arrive 10 or more minutes late.  We strive to give you quality time with our providers, and arriving late affects you and other patients whose appointments are after yours.  Also, if you no show three or more times for appointments you may be dismissed from the clinic at the providers discretion.     Again, thank you for choosing [facility].  Our hope is that these requests will decrease the amount of time that you wait before being seen by our physicians.       _____________________________________________________________  Should you have questions after your visit to [facility], please contact our office at [phone] between the hours of 8:30 a.m. and 4:30 p.m.  Voicemails left after 4:30 p.m. will not be returned until the following business day.  For prescription refill requests, have your pharmacy contact our office.

## 2014-10-07 ENCOUNTER — Ambulatory Visit: Payer: 59 | Admitting: Internal Medicine

## 2014-10-25 ENCOUNTER — Other Ambulatory Visit: Payer: Self-pay

## 2014-10-25 ENCOUNTER — Telehealth: Payer: Self-pay

## 2014-10-25 MED ORDER — PANTOPRAZOLE SODIUM 40 MG PO TBEC: 40.0000 mg | DELAYED_RELEASE_TABLET | Freq: Every day | ORAL | Status: DC

## 2014-10-25 NOTE — Telephone Encounter (Signed)
Completed.

## 2014-10-25 NOTE — Telephone Encounter (Signed)
[facility] requesting 90 day supply for Pantoprazole.

## 2014-11-05 ENCOUNTER — Ambulatory Visit (INDEPENDENT_AMBULATORY_CARE_PROVIDER_SITE_OTHER): Payer: 59 | Admitting: Internal Medicine

## 2014-11-05 ENCOUNTER — Encounter: Payer: Self-pay | Admitting: Internal Medicine

## 2014-11-05 VITALS — BP 122/68 | HR 92 | Ht 67.0 in | Wt 165.3 lb

## 2014-11-05 DIAGNOSIS — K760 Fatty (change of) liver, not elsewhere classified: Secondary | ICD-10-CM | POA: Diagnosis not present

## 2014-11-05 DIAGNOSIS — I10 Essential (primary) hypertension: Secondary | ICD-10-CM

## 2014-11-05 DIAGNOSIS — E119 Type 2 diabetes mellitus without complications: Secondary | ICD-10-CM

## 2014-11-05 DIAGNOSIS — E785 Hyperlipidemia, unspecified: Secondary | ICD-10-CM | POA: Diagnosis not present

## 2014-11-05 MED ORDER — DILTIAZEM HCL ER 240 MG PO CP24: 240.0000 mg | ORAL_CAPSULE | Freq: Every day | ORAL | Status: DC

## 2014-11-05 MED ORDER — ATORVASTATIN CALCIUM 40 MG PO TABS: 40.0000 mg | ORAL_TABLET | Freq: Every day | ORAL | Status: DC

## 2014-11-05 MED ORDER — FENOFIBRIC ACID 135 MG PO CPDR: DELAYED_RELEASE_CAPSULE | ORAL | Status: DC

## 2014-11-05 NOTE — Patient Instructions (Signed)
Your physician wants you to follow-up in: 1 year with Dr. Debara Pickett. You will receive a reminder letter in the mail two months in advance. If you don't receive a letter, please call our office to schedule the follow-up appointment.

## 2014-11-07 NOTE — Progress Notes (Signed)
OFFICE NOTE  Chief Complaint:  Routine follow-up  Primary Care Physician: Alonza Bogus, MD  HPI:  [name]  is a 59 year old gentleman with a history of hypertension, dyslipidemia, and remote chest pain. In 2003, he had a heart catheterization, which was negative. Fortunately, he quit smoking about a year ago and has gained some weight, but has since lost that. He works for the [employer] in [employer] and is active during the day, but does no specific exercise. He continues to complain of fatigue and now has concerns about erectile dysfunction today, which I will defer to you. He denies any chest pain or worsening shortness of breath. He does report a small amount of heartburn, which is improved with over-the-counter heartburn relief. Apparently, he had pneumonia in March. He does have some swelling in his legs. He seems to be getting over his pneumonia. He denies any chest pain.  I saw [name] back in the office today. He tells me that he has some back pain secondary to a fall and fracture in December. He has some chronic leg edema and shortness of breath which is stable. He denies any chest pain. Recent labs show a total cholesterol 1:30, triglycerides 216, HDL 32 and LDL of 55. The triglycerides are probably elevated related to his elevated glucose of 170 which was fasting. His hemoglobin A1c is also high at 6.7 and will need to be addressed by his primary care provider.  PMHx:  Past Medical History  Diagnosis Date  . PONV (postoperative nausea and vomiting)   . Asthma   . Dysrhythmia   . Diabetes mellitus   . GERD (gastroesophageal reflux disease)   . COPD (chronic obstructive pulmonary disease)   . Anxiety   . Depression   . Splenomegaly   . Hypertension   . Dyslipidemia   . Fracture 12/15    lower back "L-1"    Past Surgical History  Procedure Laterality Date  . Hernia repair  03/2009  . Vasectomy  1989  . Colonoscopy  08/31/2011    [id number] rectal  and colon polyps-treated. Single tubular adenoma. Next TCS 08/2016  . Esophagogastroduodenoscopy (egd) with esophageal dilation N/A 05/15/2013    PYK:DXIPJA esophagus-status post Venia Minks dilation/Portal gastropathy. Antral erosions-status post biopsy. extrinsic compression along lesser curvature likely secondary to splenomegaly. gastric bx benign  . Cardiac catheterization  10/22/2001    normal coronary arteriea (Dr. Domenic Moras)  . Transthoracic echocardiogram  2011    EF 25-05%, stage 1 diastolic dysfunction, trace TR & pulm valve regurg     FAMHx:  Family History  Problem Relation Age of Onset  . Heart disease Mother   . Arthritis    . Lung disease    . Cancer Mother     lymph nodes  . Asthma    . Diabetes Mother   . Kidney disease    . Ovarian cancer Sister   . Colon cancer Neg Hx   . Diabetes Brother   . Heart disease Brother   . Hypertension Brother   . Diabetes Sister   . Diabetes Sister     SOCHx:   reports that he quit smoking about 3 years ago. He has quit using smokeless tobacco. He reports that he does not drink alcohol or use illicit drugs.  ALLERGIES:  Allergies  Allergen Reactions  . Codeine Rash  . Simvastatin Nausea Only    ROS: A comprehensive review of systems was negative except for: Respiratory: positive for cough  and dyspnea on exertion  HOME MEDS: Current Outpatient Prescriptions  Medication Sig Dispense Refill  . albuterol (PROVENTIL HFA;VENTOLIN HFA) 108 (90 BASE) MCG/ACT inhaler Inhale 2 puffs into the lungs every 6 (six) hours as needed. For shortness of breath    . albuterol (PROVENTIL) (2.5 MG/3ML) 0.083% nebulizer solution Take 2.5 mg by nebulization 4 (four) times daily.    [name] ALPRAZolam (XANAX) 0.25 MG tablet Take 0.25 mg by mouth 2 (two) times daily.     [name] aspirin EC 81 MG tablet Take 81 mg by mouth daily.    [name] atorvastatin (LIPITOR) 40 MG tablet Take 1 tablet (40 mg total) by mouth daily. 90 tablet 3  . Choline Fenofibrate (FENOFIBRIC  ACID) 135 MG CPDR TAKE 1 CAPSULE (135 MG TOTAL) BY MOUTH DAILY. 90 capsule 3  . diltiazem (DILACOR XR) 240 MG 24 hr capsule Take 1 capsule (240 mg total) by mouth daily. 90 capsule 3  . Empagliflozin (JARDIANCE) 10 MG TABS Take 10 mg by mouth daily.    [name] HYDROcodone-acetaminophen (NORCO/VICODIN) 5-325 MG per tablet Take 1 tablet by mouth 2 (two) times daily as needed for moderate pain.     . Multiple Vitamin (MULITIVITAMIN WITH MINERALS) TABS Take 1 tablet by mouth daily.    . pantoprazole (PROTONIX) 40 MG tablet Take 1 tablet (40 mg total) by mouth daily. 30 tablet 5  . SitaGLIPtin-MetFORMIN HCl (JANUMET XR) [phone] MG TB24 Take 1 tablet by mouth daily.    . tamsulosin (FLOMAX) 0.4 MG CAPS capsule Take 0.4 mg by mouth daily.    [name] tiotropium (SPIRIVA) 18 MCG inhalation capsule Place 18 mcg into inhaler and inhale daily.     No current facility-administered medications for this visit.    LABS/IMAGING: No results found for this or any previous visit (from the past 48 hour(s)). No results found.  VITALS: BP 122/68 mmHg  Pulse 92  Ht 5' 7"  (1.702 m)  Wt 165 lb 4.8 oz (74.98 kg)  BMI 25.88 kg/m2  EXAM: General appearance: alert and no distress Neck: no carotid bruit and no JVD Lungs: clear to auscultation bilaterally Heart: regular rate and rhythm, S1, S2 normal, no murmur, click, rub or gallop Abdomen: soft, non-tender; bowel sounds normal; no masses,  no organomegaly Extremities: extremities normal, atraumatic, no cyanosis or edema Pulses: 2+ and symmetric Skin: Skin color, texture, turgor normal. No rashes or lesions Neurologic: Grossly normal Psych: Mood, affect normal  EKG: Sinus rhythm with occasional PVCs at 90  ASSESSMENT: 1. Hypertension-controlled 2. Elevated triglycerides secondary to hyperglycemia 3. Diabetes type 2 - uncontrolled with A1c of 6.7  PLAN: 1.   [name] is doing well from a cardiac standpoint. His blood pressure is well controlled. His diabetes is  not at goal and will need improvement. This is followed by his primary care provider. He does have elevated triglycerides which are related to simple sugars. If he can eliminate these that should help with his blood sugar as well. Plan to see him back annually or sooner as necessary.  Pixie Casino, MD, [facility] Attending Cardiologist [place], [place] 11/07/2014, 3:47 PM

## 2014-11-09 ENCOUNTER — Other Ambulatory Visit: Payer: Self-pay | Admitting: *Deleted

## 2014-11-09 ENCOUNTER — Telehealth: Payer: Self-pay | Admitting: Orthopedic Surgery

## 2014-11-09 MED ORDER — HYDROCODONE-ACETAMINOPHEN 5-325 MG PO TABS: 1.0000 | ORAL_TABLET | Freq: Three times a day (TID) | ORAL | Status: DC | PRN

## 2014-11-09 NOTE — Telephone Encounter (Signed)
Prescription available, patient aware

## 2014-11-09 NOTE — Telephone Encounter (Signed)
Patient is calling requesting pain medication refill onHYDROcodone-acetaminophen (NORCO/VICODIN) 5-325 MG per tablet please advise?

## 2014-11-10 NOTE — Telephone Encounter (Signed)
Patient picked up prescription.

## 2014-12-03 ENCOUNTER — Other Ambulatory Visit: Payer: Self-pay | Admitting: Internal Medicine

## 2015-01-14 ENCOUNTER — Other Ambulatory Visit: Payer: Self-pay

## 2015-01-17 NOTE — Telephone Encounter (Signed)
I called the pharmacy since this request said the RX was written 02/24/2014. I saw a refill sent in for 10/2014. Chase, the pharmacist said that they do have it, although it was for 30 day supply. He said could he do a 90 day supply with one refill and I told him that will be fine. Routing to Laban Emperor, NP since she was the one that sent that RX.

## 2015-01-25 MED ORDER — PANTOPRAZOLE SODIUM 40 MG PO TBEC: 40.0000 mg | DELAYED_RELEASE_TABLET | Freq: Every day | ORAL | Status: DC

## 2015-01-25 NOTE — Telephone Encounter (Signed)
I sent it in. I didn't know if I needed to or not, but it has been sent.

## 2015-02-06 NOTE — Progress Notes (Signed)
REVIEWED-NO ADDITIONAL RECOMMENDATIONS. 

## 2015-03-07 ENCOUNTER — Other Ambulatory Visit (HOSPITAL_COMMUNITY): Payer: 59

## 2015-03-07 ENCOUNTER — Encounter (HOSPITAL_COMMUNITY): Payer: 59 | Attending: Oncology | Admitting: Oncology

## 2015-03-07 ENCOUNTER — Ambulatory Visit (HOSPITAL_COMMUNITY): Payer: 59 | Admitting: Oncology

## 2015-03-07 ENCOUNTER — Encounter (HOSPITAL_COMMUNITY): Payer: Self-pay | Admitting: Oncology

## 2015-03-07 ENCOUNTER — Encounter (HOSPITAL_COMMUNITY): Payer: 59

## 2015-03-07 VITALS — BP 120/68 | HR 90 | Temp 97.8°F | Resp 18 | Wt 165.0 lb

## 2015-03-07 DIAGNOSIS — R161 Splenomegaly, not elsewhere classified: Secondary | ICD-10-CM

## 2015-03-07 DIAGNOSIS — E8809 Other disorders of plasma-protein metabolism, not elsewhere classified: Secondary | ICD-10-CM

## 2015-03-07 DIAGNOSIS — E119 Type 2 diabetes mellitus without complications: Secondary | ICD-10-CM | POA: Diagnosis not present

## 2015-03-07 DIAGNOSIS — D72819 Decreased white blood cell count, unspecified: Secondary | ICD-10-CM

## 2015-03-07 DIAGNOSIS — D696 Thrombocytopenia, unspecified: Secondary | ICD-10-CM | POA: Diagnosis not present

## 2015-03-07 DIAGNOSIS — K760 Fatty (change of) liver, not elsewhere classified: Secondary | ICD-10-CM

## 2015-03-07 LAB — COMPREHENSIVE METABOLIC PANEL
ALBUMIN: 4.4 g/dL (ref 3.5–5.0)
ALT: 51 U/L (ref 17–63)
AST: 29 U/L (ref 15–41)
Alkaline Phosphatase: 59 U/L (ref 38–126)
Anion gap: 7 (ref 5–15)
BUN: 18 mg/dL (ref 6–20)
CHLORIDE: 108 mmol/L (ref 101–111)
CO2: 26 mmol/L (ref 22–32)
Calcium: 9.3 mg/dL (ref 8.9–10.3)
Creatinine, Ser: 1.03 mg/dL (ref 0.61–1.24)
GFR calc Af Amer: >60 mL/min (ref >60)
GFR calc non Af Amer: >60 mL/min (ref >60)
Glucose, Bld: 147 mg/dL — ABNORMAL HIGH (ref 65–99)
Potassium: 4.7 mmol/L (ref 3.5–5.1)
SODIUM: 141 mmol/L (ref 135–145)
Total Bilirubin: 0.8 mg/dL (ref 0.3–1.2)
Total Protein: 8.1 g/dL (ref 6.5–8.1)

## 2015-03-07 LAB — CBC WITH DIFFERENTIAL/PLATELET
BASOS PCT: 1 % (ref 0–1)
Basophils Absolute: 0 10*3/uL (ref 0.0–0.1)
Eosinophils Absolute: 0.3 10*3/uL (ref 0.0–0.7)
Eosinophils Relative: 5 % (ref 0–5)
HCT: 48.3 % (ref 39.0–52.0)
HEMOGLOBIN: 16.4 g/dL (ref 13.0–17.0)
LYMPHS ABS: 1 10*3/uL (ref 0.7–4.0)
Lymphocytes Relative: 20 % (ref 12–46)
MCH: 28.6 pg (ref 26.0–34.0)
MCHC: 34 g/dL (ref 30.0–36.0)
MCV: 84.1 fL (ref 78.0–100.0)
Monocytes Absolute: 0.4 10*3/uL (ref 0.1–1.0)
Monocytes Relative: 8 % (ref 3–12)
Neutro Abs: 3.2 10*3/uL (ref 1.7–7.7)
Neutrophils Relative %: 66 % (ref 43–77)
Platelets: 99 10*3/uL — ABNORMAL LOW (ref 150–400)
RBC: 5.74 MIL/uL (ref 4.22–5.81)
RDW: 14.1 % (ref 11.5–15.5)
WBC: 4.8 10*3/uL (ref 4.0–10.5)

## 2015-03-07 LAB — LACTATE DEHYDROGENASE: LDH: 141 U/L (ref 98–192)

## 2015-03-07 NOTE — Progress Notes (Signed)
Alonza Bogus, MD [facility] [place] [address]  Thrombocytopenia - Plan: CBC with Differential, Lactate dehydrogenase  Leukopenia - Plan: CBC with Differential, Lactate dehydrogenase  CURRENT THERAPY: Observation  INTERVAL HISTORY: [name] 59 y.o. male returns for followup of splenomegaly with mild thrombocytopenia in the setting of diabetes and intestinal paresis. [place] in past demonstrated cholelithiasis along with fatty liver by ultrasound.    I personally reviewed and went over laboratory results with the patient.  The results are noted within this dictation.   WBC and Hgb are WNL.  Platelet count is stable in the 100,000 range.    He denies any bleeding and bruising.  He denies any rashes.  Past Medical History  Diagnosis Date  . PONV (postoperative nausea and vomiting)   . Asthma   . Dysrhythmia   . Diabetes mellitus   . GERD (gastroesophageal reflux disease)   . COPD (chronic obstructive pulmonary disease)   . Anxiety   . Depression   . Splenomegaly   . Hypertension   . Dyslipidemia   . Fracture 12/15    lower back "L-1"    has Bilateral leg pain; Back pain with radiation; DDD (degenerative disc disease), lumbar; Lumbago; Muscle weakness (generalized); Abdominal bloating; Abdominal pain, generalized; Non-alcoholic fatty liver disease; Splenomegaly; Lower abdominal pain; Abnormal LFTs; GERD (gastroesophageal reflux disease); Unspecified constipation; Leukopenia; Thrombocytopenia; HTN (hypertension); Dyslipidemia; DM2 (diabetes mellitus, type 2); and Hyperproteinemia on his problem list.     is allergic to codeine and simvastatin.  Mr. Lienau had no medications administered during this visit.  Past Surgical History  Procedure Laterality Date  . Hernia repair  03/2009  . Vasectomy  1989  . Colonoscopy  08/31/2011    [id number] rectal and colon polyps-treated. Single tubular adenoma. Next TCS 08/2016  .  Esophagogastroduodenoscopy (egd) with esophageal dilation N/A 05/15/2013    QQV:ZDGLOV esophagus-status post Venia Minks dilation/Portal gastropathy. Antral erosions-status post biopsy. extrinsic compression along lesser curvature likely secondary to splenomegaly. gastric bx benign  . Cardiac catheterization  10/22/2001    normal coronary arteriea (Dr. Domenic Moras)  . Transthoracic echocardiogram  2011    EF 56-43%, stage 1 diastolic dysfunction, trace TR & pulm valve regurg     Denies any headaches, dizziness, double vision, fevers, chills, night sweats, nausea, vomiting, diarrhea, constipation, chest pain, heart palpitations, shortness of breath, blood in stool, black tarry stool, urinary pain, urinary burning, urinary frequency, hematuria.   PHYSICAL EXAMINATION  ECOG PERFORMANCE STATUS: 0 - Asymptomatic  Filed Vitals:   03/07/15 1253  BP: 120/68  Pulse: 90  Temp: 97.8 F (36.6 C)  Resp: 18    GENERAL:alert, no distress, well nourished, well developed, comfortable, cooperative and smiling SKIN: skin color, texture, turgor are normal, no rashes or significant lesions HEAD: Normocephalic, No masses, lesions, tenderness or abnormalities EYES: normal, PERRLA, EOMI, Conjunctiva are pink and non-injected EARS: External ears normal OROPHARYNX:lips, buccal mucosa, and tongue normal and mucous membranes are moist  NECK: supple, trachea midline LYMPH:  not examined BREAST:not examined LUNGS: CTA B/L HEART: RRR without murmur, rub, or gallop. ABDOMEN:+ BS x 4 quadrants, soft, nontender. BACK: Back symmetric, no curvature. EXTREMITIES:less then 2 second capillary refill, no skin discoloration, no cyanosis  NEURO: alert & oriented x 3 with fluent speech, no focal motor/sensory deficits   LABORATORY DATA: CBC    Component Value Date/Time   WBC 4.8 03/07/2015 1113   RBC 5.74 03/07/2015 1113   RBC  5.31 08/12/2013 1449   HGB 16.4 03/07/2015 1113   HCT 48.3 03/07/2015 1113   PLT 99*  03/07/2015 1113   MCV 84.1 03/07/2015 1113   MCH 28.6 03/07/2015 1113   MCHC 34.0 03/07/2015 1113   RDW 14.1 03/07/2015 1113   LYMPHSABS 1.0 03/07/2015 1113   MONOABS 0.4 03/07/2015 1113   EOSABS 0.3 03/07/2015 1113   BASOSABS 0.0 03/07/2015 1113      Chemistry      Component Value Date/Time   NA 141 03/07/2015 1113   K 4.7 03/07/2015 1113   CL 108 03/07/2015 1113   CO2 26 03/07/2015 1113   BUN 18 03/07/2015 1113   CREATININE 1.03 03/07/2015 1113   CREATININE 0.86 04/24/2013 0756      Component Value Date/Time   CALCIUM 9.3 03/07/2015 1113   ALKPHOS 59 03/07/2015 1113   ALKPHOS 41 06/22/2011   AST 29 03/07/2015 1113   AST 22 06/22/2011   ALT 51 03/07/2015 1113   BILITOT 0.8 03/07/2015 1113   BILITOT 0.5 06/22/2011     Lab Results  Component Value Date   PROT 8.1 03/07/2015   ALBUMINELP 54.0* 09/06/2014   A1GS 3.9 09/06/2014   A2GS 10.0 09/06/2014   BETS 5.7 09/06/2014   BETA2SER 3.4 09/06/2014   GAMS 23.0* 09/06/2014   MSPIKE NOT DETECTED 09/06/2014   SPEI  09/06/2014    Nonspecific diffuse polyclonal type increase in gamma globulins.   SPECOM (NOTE) 09/06/2014   IGGSERUM 2200* 09/06/2014   IGA 12* 09/06/2014   IGMSERUM 166 09/06/2014   IMMELINT (NOTE) 09/06/2014      RADIOGRAPHIC STUDIES:  No results found.    ASSESSMENT AND PLAN:  Thrombocytopenia Secondary to splenomegaly and fatty infiltration of liver.   Labs today: CBC diff, CMET, LDH.    Stable and remaining near the 100,000 range.  No bleeding episodes.  Labs in 6 months: CBC diff, LDH  Leukopenia Secondary to fatty infiltration of liver.     THERAPY PLAN:  Continue to monitor counts.  No intervention needed at this time.  All questions were answered. The patient knows to call the clinic with any problems, questions or concerns. We can certainly see the patient much sooner if necessary.  Patient and plan discussed with Dr. Ancil Linsey and she is in agreement with the  aforementioned.   This note is electronically signed by: Robynn Pane 03/07/2015 1:37 PM

## 2015-03-07 NOTE — Assessment & Plan Note (Signed)
Secondary to splenomegaly and fatty infiltration of liver.   Labs today: CBC diff, CMET, LDH.    Stable and remaining near the 100,000 range.  No bleeding episodes.  Labs in 6 months: CBC diff, LDH

## 2015-03-07 NOTE — Patient Instructions (Signed)
[place] at [facility] Discharge Instructions  RECOMMENDATIONS MADE BY THE CONSULTANT AND ANY TEST RESULTS WILL BE SENT TO YOUR REFERRING PHYSICIAN.  Exam and discussion by Robynn Pane, PA - C Labs are stable Call with any concerns or issues  Follow-up in 6 months with labs and office visit.  Thank you for choosing [place] at [facility] to provide your oncology and hematology care.  To afford each patient quality time with our provider, please arrive at least 15 minutes before your scheduled appointment time.    You need to re-schedule your appointment should you arrive 10 or more minutes late.  We strive to give you quality time with our providers, and arriving late affects you and other patients whose appointments are after yours.  Also, if you no show three or more times for appointments you may be dismissed from the clinic at the providers discretion.     Again, thank you for choosing [facility].  Our hope is that these requests will decrease the amount of time that you wait before being seen by our physicians.       _____________________________________________________________  Should you have questions after your visit to [facility], please contact our office at [phone] between the hours of 8:30 a.m. and 4:30 p.m.  Voicemails left after 4:30 p.m. will not be returned until the following business day.  For prescription refill requests, have your pharmacy contact our office.

## 2015-03-07 NOTE — Assessment & Plan Note (Signed)
Secondary to fatty infiltration of liver.

## 2015-04-06 ENCOUNTER — Ambulatory Visit (HOSPITAL_COMMUNITY): Payer: 59 | Attending: Pulmonary Disease | Admitting: Physical Therapy

## 2015-04-06 DIAGNOSIS — R6889 Other general symptoms and signs: Secondary | ICD-10-CM

## 2015-04-06 NOTE — Therapy (Addendum)
[place], [place], [address] Phone: [phone]   Fax:  [phone]       Physical Work Performance EvaluationTM [facility] [address], [place]  [address] Phone [phone]  Fax [phone] Please note that significant inconsistent behavior influenced test results. Name: [name], [name]  Injury/Onset Date: 08/24/2013  Evaluation Date: 04/06/2015  Test Start, End, Duration: 8:10 AM, 12:45 PM, 4:34 hours  Diagnosis: COPD  Height, Weight: 5"7", 165lb  Starting BP, HR, Pain: 122/70, 111 bpm, Pain 1 out of 10  This report summarizes the results of the [place] Physical Work Education officer, environmental.  This evaluation is substantiated by reliability and validity research conducted at the [facility] [place] and reported in the Journal of [place], September 199411  [name], et al. Journal of Occupational Medicine. September 1994 Volume 36, No. 9: pages [phone].   [name] Kitchen Overall Level of Work: Falls within [employer] range.  Exerting up to 20 pounds of force occasionally, and/or up to 10 pounds of force frequently, and/or a negligible amount of force constantly (Constantly: activity or condition exist 2/3 or more of the time) to move objects.  Physical demand requirements are in excess of those for Sedentary Work.  Even though the weight lifted may be only a negligible amount, a job should be rated Light Work: (1) when it requires walking or standing to significant degree; or (2) when it requires sitting most of the time but entails pushing and/or pulling of arm or leg controls; and/or (3) when the job requires working at a production rate pace entailing the constant pushing and/or pulling of materials even though the weight of those materials is negligible.  NOTE: The constant stress and strain of maintaining a production rate pace, especially in an industrial  setting, can be and is physically demanding of a worker even though the amount of force exerted is negligible.  Please note that the overall level of work was significantly influenced by the client's heart rate..  Therefore, the Light level of work indicates a minimum ability rather than a maximum ability.  A maximum overall level of work cannot be determined at this time due to elevated Heart Rate. .  Please see the Task Performance Table for specific abilities.   Tolerance for the 8-Hour Day: Based on this evaluation, the client is incapable of sustaining the Light level of work for an 8-hour day/40-hour week.  However, individual task scores indicate this client has the standing and walking ability sufficient for Light work.   If allowed to work at the Sedentary level the client can tolerate the 8-hour day.  Please note that the tolerance for the 8-hour day was significantly influenced by the client's Heart rate and SOB  indicates his minimal rather than his maximal ability.    Self Limiting Behavior:   Client participated fully in all tasks.  No self-limiting behavior noted.    . Self Limiting < 20% of tasks = Within normal limits1 . Self-Limiting 21% to 33% of tasks = Exceeds normal limits1 . Self-Limiting > 33% of tasks = Significantly exceeds normal limits1 1When compared to a motivated group of patients who participated in research.     OBSERVED CLINICAL INCONSISTENCIES  . The following function to function inconsistencies were noted in the evaluation: . No testing inconsistencies observed or noted . The client improved on one or more of the endurance tasks.  This change was  likely due to warm up.  IMPACT OF CLINICAL INCONSISTENCIES ON TEST RESULTS  . The observed clinical inconsistencies noted above significantly impacted the results of the client's FCE. . The observed clinical inconsistencies noted above minimally impacted the FCE recommendations.   RESULTS OF FORMAL CONSISTENCY OF  EFFORT TESTING  . The [employer] FCE utilizes a formal consistency of effort protocol established and validated by Stokes et al.2  In this protocol, three (3) different statistical calculations on grip strength testing data are performed.  These results are then combined with any evidence of clinical inconsistencies or self-limiting behavior observed during the [place].  The final consistency of effort conclusion indicates the strength of all of this evidence combined.  . Combining the results of the clinical consistency comparisons, the presence of self-limiting behavior and the three formal consistency cross comparisons of the grip strength data, indicates that there is significant evidence of low effort and inconsistent behavior.  See addendum for details of the formal consistency of effort scoring criteria.   SUBJECTIVE PAIN STATEMENTS  The client made the following subjective pain statements during the test: . Activity was not limited by pain rather by heart rate and SOB> . These pain statements were consistent with the observed movement patterns.  PAIN BEHAVIORS AND THEIR IMPACT ON TEST RESULTS  The client did not demonstrate any pain behaviors during the test.  OTHER EXTERNAL FACTORS THAT MIGHT IMPACT TEST RESULTS  . Heart rate and COPD   BODY MECHANICS AND MOVEMENT PATTERNS  The client demonstrated safe body mechanics and movement patterns during the test.  BRIEF SUMMARY OF MEDICAL HISTORY  [name] states that he was working for [employer] for almost 25 yerars.  He states that  on December 4th 2015 he fell off his back porch fractured L1.  He was given a back brace for 3 months at this point his MD stated that he felt that he should quit and get on disability due to the fact that he has COPD, has an accelerated heart rate that he has been on medication for since 2005 and his liver is failing.   Pt states that he becomes tired very easily.  He states that at  times his left knee gives out ,(once a month).  Pt states he needs to use his inhaler if he brings groceries into the house or if he takes a shower.  MEDICATIONS   Medication Dose Frequency Last Dose Taken  Jardian 10 mg qd 04/06/2015  Janumet 40 qd 04/06/2015  atorvastatin 40 qd 04/06/2015  diltiazem 240 qd 04/06/2015  pantoprazole 40 qd 9/28/2016p  spirvia  qd 04/06/2015     BRIEF MUSCULOSKELETAL SCREEN  . ROM for cervical/lumbar/ UE and LE are functional.   .  . Strength for UE:  generally 4+/5 except B ER which is 3/5  .  [name] Kitchen Strength for LE within functional limits TEST LENGTH AND REST BREAKS  The test lasted 4:34 hours.  Short pauses of 2-3 minutes between tasks occur while the evaluator is setting up equipment and documenting scores.  No additional rest breaks were taken.   TASK PERFORMANCE   Tasks Client Performance 1  Floor to waist lift 13 lb Occas.  Waist to eye level lift 8 lb Occas.  Two handed carrying 16 lb Occas.  One handed carrying R15 lb Occ  Pushing 25 lb Occas. 3  Pulling 25 lb Occas. 3  Sitting Frequently   Standing Frequently   Work arms over head-standing  Frequently  Work bent over-standing/stooping Occasionally  Work squatting/crouching Frequently  Climbing stairs  Never  Repetitive squatting Never  Walking Occasionally   Balance on level surfaces Adequate  Finger Dexterity2 5  Grip Strength L52  R56 lb   1 Occasionally = up to 1/3 of the day, Frequently = 1/3 to 2/3 of the day, Constantly = 2/3 to the full day.  Frequent lifting = 50% of Occasional; Constant lifting = 20%  of Occasional. 2 D.O.T. The aptitudes:  1 (90-100 percentile), 2 (67-89 percentile), 3 (34-66 percentile), 4 (11-33 percentile), 5 (0-10 percentile). 3 Pounds of force is the amount of force the client exerted during the pushing and pulling tasks.  If pushing or pulling is required for work, the force required for the task should be measured with a force gauge for comparison.        JOB SPECIFIC TESTING  . None  MAJOR AREAS OF DYSFUNCTION  . cardiac issues;  FACTORS UNDERLYING PERFORMANCE  . Generalized de-conditioning . elevated heart rate  EXIT INTERVIEW  . There were no changes in musculoskeletal status from beginning to end. . Pain Score: 2 . Gait pattern leaving the evaluation is normal compared to gait pattern used upon arriving for test. . The client drove himself to the evaluation. . Client is very courteous and thanked therapist for testing.     Evaluator: Leeroy Cha PT Phone: [phone]    Consistency of Effort Testing and Conclusion The [employer] FCE utilizes a formal consistency of effort protocol established and validated by Stokes et al.2   In this protocol, three (3) different statistical calculations on grip strength testing data are performed.  These results are then combined with any evidence of clinical inconsistencies or self-limiting behavior observed during the [place].  The final consistency of effort conclusion indicates the strength of all of this evidence combined.   Statistical Test 1.  Right Hand The standard deviation of sustained maximum right grip strength across 5 handle positions was 10.88, indicating a normal bell shaped curve and maximum effort on the bell shaped curve test for the right hand.2   Left Hand  The standard deviation of sustained maximum left grip strength across 5 handle positions was 8.00, indicating a normal bell shaped curve and maximum effort on the bell shaped curve test for the left hand.2     Statistical Test 2.   The Rapid Exchange Grip (REG) is 8 pounds different from the peak slow sustained grip.  Clinical studies demonstrate2 that this difference is not significant and indicates that the client exerted maximum effort.    Statistical Test 3.   A regression analysis was calculated based on the peak effort of sustained grip and the maximum REG.  This calculation indicates  that the patient gave a maximum effort on grip strength tests.2    Self-Limiting Behavior Self-Limiting behavior was ? 20%.  Clinical Inconsistencies Additional significant clinical inconsistencies were noted during the FCE.  Conclusion Regarding Consistency of Effort.  Combining the results of the clinical consistency comparisons, the presence of self-limiting behavior and the three formal consistency cross comparisons of the grip strength data, indicates that there is significant evidence of low effort and inconsistent behavior.2   2Stokes, HM et al.  Identification of Low-effort Patients Through Dynamometry.  Journal of Hand Surgery. Vol 20A, No 6, November, 1995, pp. 1047 - 1055.      DATA DETAILS   Note: For each task, client participation is rated as:  Appropriate -  Client and therapist agree on stopping task. Full, physical effort given. Overextending - Therapist stops task. Client willing to continue despite maximum being reached. Full, physical effort given. Self-limiting - Client stops task before objective signs indicate that a maximum physical effort has been reached.   Lift - Floor to Waist - Over-extending . Completed 3 lb safely . Pain Score = 3.00 . Pain Location = R wrist . Ending HR = 117 Signs of Effort . Post Trunk Lean . Hands Slip/Difficulty Holding Box . Shaking/Quivering . Increased Time to Complete Repetitions . Vertical Trunk Alignment Decreases . Pt is wheezing and SOB  Climbing Stairs - Over-extending . Completed 20 of 100 - 20% of task . Pain Score = 0.00 . Pain Location = . Ending HR = 117 . Severe Deviations  o Takes One Step at a Time Rather than One Foot Over the Other  o Pulls Self Up with Arms  o Decreased Hip Extension  o Decreased Ankle Plantar Flexion  o increased SOB  Repetitive Squatting - Over-extending . Completed 4 of 25 - 16% of task . Pain Score = 2.00 . Pain Location = back . Ending HR = 128 . Severe Deviations  o  Other  Lift - Waist Height to Eye Level - Over-extending . Completed 8 lb safely . Pain Score = 2.00 . Pain Location = back . Ending HR = 119 Signs of Effort . Accessory Muscles . Post Trunk Lean . Hands Slip/Difficulty Holding Box . Decreased Box Control . Shaking/Quivering . Increased Time to Complete Repetitions . Upper Trap Elevation . SOB  Bilateral Carry - Appropriate . Completed 16 lb safely . Pain Score = 2.00 . Pain Location = back . Ending HR = 117 Signs of Effort . Accessory Muscles . Post Trunk Lean . Decreased Box Control . Shaking/Quivering . Irregular Gait . Increased Time to Complete Repetitions . Cadence Quickens . Increased Knee Flexion During Carry . SOB  Unilateral Carry - Over-extending . Right completed 15 lb safely . Pain Score = 3.00 . Pain Location = back . Ending HR = 116 Signs of Effort . Accessory Muscles . Lateral Trunk Lean . Shaking/Quivering . Irregular Gait . Increased Time to Complete Repetitions . Shorter Steps . SOB  Maximum Dynamic Pushing - Appropriate . Completed 25 lb safely . Pain Score = 3.00 . Pain Location = back . Ending HR = 121 Signs of Effort . Accessory Muscles . Forward Lean Increases . Toe Walking (Loses Heel-Strike) . Increased Time to Complete Repetitions . SOB  Maximum Dynamic Pulling - Over-extending . Completed 25 lb safely . Pain Score = 5.00 . Pain Location = back . Ending HR = 119 Signs of Effort . Accessory Muscles . Posterior Lean Increases . Increased Time to Complete Repetitions . Sled Swerves . Step Length Decreases . SOB  Sitting Tolerance - Appropriate . Completed 5:00 of 5:00 minutes - 100% of task . Position Adjustments = 2 . Pain Score   1 Min = 2 . Pain Location   1 Min = Back . Within Normal Limits  Standing Tolerance - Appropriate . Completed 5:00 of 5:00 minutes - 100% of task . Position Adjustments = 1 . Pain Score   1 Min = 2 . Pain Location   1 Min =  Back . Minimal Deviations  o Decreased Weight Bearing on One Leg  Work Arms Overhead & Standing - Appropriate . Completed 5:00 of 5:00 minutes - 100% of task . Pain Score  1 Min = 2  3 Min = 2  5 Min = 3 . Pain Location   1 Min = Back  3 Min = Back/ shoulder  5 Min = Back/shoulder . Minimal Deviations  o Decreased Weight Bearing on One Leg  o Lateral Trunk Shift (Right or Left)  o Lateral Trunk Flexion (Right or Left)  o Posterior Trunk Lean  o SOB  Work [employer] Over / Stooping - Appropriate . Completed 5:00 of 5:00 minutes - 100% of task . Pain Score   1 Min = 2  3 Min = 3  5 Min = 6 . Pain Location   1 Min = Low back  3 Min = Low back  5 Min = Low back . Moderate Deviations  o Decreased Weight Bearing on One Leg  o Using Table to Support Thighs  Squatting - Appropriate . Completed 5:00 of 5:00 minutes - 100% of task . Position Adjustments = 1 . Pain Score   1 Min = 2  3 Min = 3  5 Min = 3 . Pain Location   1 Min = Back  3 Min = Back  5 Min = Back . Within Normal Limits  o Supporting Body Weight through "Up" Leg  o Decreased Lumbar Flexion  o Decreased Hip Flexion  Walking - Appropriate . Completed 500 of 500 - 100% of task . Pain Score = 3.00 . Pain Location = back . Ending HR = 117 . Moderate Deviations  o Antalgic Gait  o Decreased Hip Extension  o Decreased Ankle Plantar Flexion  Isometric Grip Strength - Appropriate . Pain Score = 7 . Pain Location = Rt mid deltoid area . Percentile Score  Left = 50%   Right = 0% . Grip Avg.  Left = 52 lb   Right = 26 lb . Within Normal Limits  Modified Purdue Pegboard - Appropriate . Percentile Score  Left = 0%   Right = 0% . DOT Aptitude Score  Left = 5   Right = 5 . Within Normal Limits                   G-Codes - 2015-05-01 1445    Functional Limitation Changing and maintaining body position;Carrying, moving and handling objects   Carrying, Moving and Handling Objects Current Status (F6433) At least 60  percent but less than 80 percent impaired, limited or restricted   Carrying, Moving and Handling Objects Goal Status (I9518) At least 60 percent but less than 80 percent impaired, limited or restricted   Carrying, Moving and Handling Objects Discharge Status [phone]) At least 60 percent but less than 80 percent impaired, limited or restricted       Problem List Patient Active Problem List   Diagnosis Date Noted  . Hyperproteinemia 09/05/2014  . HTN (hypertension) 10/14/2013  . Dyslipidemia 10/14/2013  . DM2 (diabetes mellitus, type 2) 10/14/2013  . Leukopenia 08/08/2013  . Thrombocytopenia 08/08/2013  . Lower abdominal pain 07/06/2013  . Abnormal LFTs 07/06/2013  . GERD (gastroesophageal reflux disease) 07/06/2013  . Unspecified constipation 07/06/2013  . Abdominal bloating 04/21/2013  . Abdominal pain, generalized 04/21/2013  . Non-alcoholic fatty liver disease 04/21/2013  . Splenomegaly 04/21/2013  . Lumbago 11/26/2011  . Muscle weakness (generalized) 11/26/2011  . Bilateral leg pain 11/06/2011  . Back pain with radiation 11/06/2011  . DDD (degenerative disc disease), lumbar 11/06/2011    Rayetta Humphrey, PT CLT [phone] 05/01/2015, 2:48 PM  [facility]  [place], [place], [address] Phone: [phone]   Fax:  [phone]

## 2015-08-18 DIAGNOSIS — J441 Chronic obstructive pulmonary disease with (acute) exacerbation: Secondary | ICD-10-CM | POA: Diagnosis not present

## 2015-08-18 DIAGNOSIS — E119 Type 2 diabetes mellitus without complications: Secondary | ICD-10-CM | POA: Diagnosis not present

## 2015-08-18 DIAGNOSIS — I10 Essential (primary) hypertension: Secondary | ICD-10-CM | POA: Diagnosis not present

## 2015-08-18 DIAGNOSIS — K210 Gastro-esophageal reflux disease with esophagitis: Secondary | ICD-10-CM | POA: Diagnosis not present

## 2015-08-18 DIAGNOSIS — Z125 Encounter for screening for malignant neoplasm of prostate: Secondary | ICD-10-CM | POA: Diagnosis not present

## 2015-09-03 NOTE — Progress Notes (Addendum)
Christopher Bogus, MD [place] [address]  Thrombocytopenia [facility])  Leukopenia  CURRENT THERAPY: Observation  INTERVAL HISTORY: [name] 60 y.o. male returns for followup of splenomegaly with mild thrombocytopenia in the setting of diabetes and intestinal paresis. [place] in past demonstrated cholelithiasis along with fatty liver by ultrasound.    I personally reviewed and went over laboratory results with the patient.  The results are noted within this dictation.   WBC and Hgb are WNL.  Platelet count is stable in the 100,000 range.    He reports that he had laboratory work by his primary care physician, Dr. [name], approximate 1 week ago. We will not do laboratory work today and we will try to get a report of these labs.  Patient is doing very well. He denies any complaints today.  He denies any bleeding and bruising.  He denies any epistaxis, gingival bleeding, hematemesis, hemoptysis, blood in stool, black tarry stool. He denies any rashes.  Past Medical History  Diagnosis Date  . PONV (postoperative nausea and vomiting)   . Asthma   . Dysrhythmia   . Diabetes mellitus   . GERD (gastroesophageal reflux disease)   . COPD (chronic obstructive pulmonary disease) ([place])   . Anxiety   . Depression   . Splenomegaly   . Hypertension   . Dyslipidemia   . Fracture 12/15    lower back "L-1"    has Bilateral leg pain; Back pain with radiation; DDD (degenerative disc disease), lumbar; Lumbago; Muscle weakness (generalized); Abdominal bloating; Abdominal pain, generalized; Non-alcoholic fatty liver disease; Splenomegaly; Lower abdominal pain; Abnormal LFTs; GERD (gastroesophageal reflux disease); Unspecified constipation; Leukopenia; Thrombocytopenia ([place]); HTN (hypertension); Dyslipidemia; DM2 (diabetes mellitus, type 2) ([place]); and Hyperproteinemia on his problem list.     is allergic to codeine and simvastatin.  [name] had no  medications administered during this visit.  Past Surgical History  Procedure Laterality Date  . Hernia repair  03/2009  . Vasectomy  1989  . Colonoscopy  08/31/2011    DJM:EQASTMHD rectal and colon polyps-treated. Single tubular adenoma. Next TCS 08/2016  . Esophagogastroduodenoscopy (egd) with esophageal dilation N/A 05/15/2013    QQI:WLNLGX esophagus-status post Venia Minks dilation/Portal gastropathy. Antral erosions-status post biopsy. extrinsic compression along lesser curvature likely secondary to splenomegaly. gastric bx benign  . Cardiac catheterization  10/22/2001    normal coronary arteriea (Dr. Domenic Moras)  . Transthoracic echocardiogram  2011    EF 21-19%, stage 1 diastolic dysfunction, trace TR & pulm valve regurg     Denies any headaches, dizziness, double vision, fevers, chills, night sweats, nausea, vomiting, diarrhea, constipation, chest pain, heart palpitations, shortness of breath, blood in stool, black tarry stool, urinary pain, urinary burning, urinary frequency, hematuria.   PHYSICAL EXAMINATION  ECOG PERFORMANCE STATUS: 0 - Asymptomatic  Filed Vitals:   09/05/15 0936  BP: 108/73  Pulse: 100  Temp: 97.8 F (36.6 C)  Resp: 18    GENERAL:alert, no distress, well nourished, well developed, comfortable, cooperative and smiling, Unaccompanied SKIN: skin color, texture, turgor are normal, no rashes or significant lesions HEAD: Normocephalic, No masses, lesions, tenderness or abnormalities EYES: normal, PERRLA, EOMI, Conjunctiva are pink and non-injected EARS: External ears normal OROPHARYNX:lips, buccal mucosa, and tongue normal and mucous membranes are moist  NECK: supple, trachea midline LYMPH:  not examined BREAST:not examined LUNGS: Clear to auscultation bilaterally without wheezes, rales, or rhonchi. Normal percussion without dullness, or hyperresonance. HEART: Regular rhythm and  rate without murmur, rub, or gallop. ABDOMEN: Bowel sounds x 4 quadrants, soft,  nontender. No identifiable hepatomegaly or splenomegaly BACK: Back symmetric, no curvature. EXTREMITIES:less then 2 second capillary refill, no skin discoloration, no cyanosis  NEURO: alert & oriented x 3 with fluent speech, no focal motor/sensory deficits   LABORATORY DATA: CBC    Component Value Date/Time   WBC 4.8 03/07/2015 1113   RBC 5.74 03/07/2015 1113   RBC 5.31 08/12/2013 1449   HGB 16.4 03/07/2015 1113   HCT 48.3 03/07/2015 1113   PLT 99* 03/07/2015 1113   MCV 84.1 03/07/2015 1113   MCH 28.6 03/07/2015 1113   MCHC 34.0 03/07/2015 1113   RDW 14.1 03/07/2015 1113   LYMPHSABS 1.0 03/07/2015 1113   MONOABS 0.4 03/07/2015 1113   EOSABS 0.3 03/07/2015 1113   BASOSABS 0.0 03/07/2015 1113      Chemistry      Component Value Date/Time   NA 141 03/07/2015 1113   K 4.7 03/07/2015 1113   CL 108 03/07/2015 1113   CO2 26 03/07/2015 1113   BUN 18 03/07/2015 1113   CREATININE 1.03 03/07/2015 1113   CREATININE 0.86 04/24/2013 0756      Component Value Date/Time   CALCIUM 9.3 03/07/2015 1113   ALKPHOS 59 03/07/2015 1113   ALKPHOS 41 06/22/2011   AST 29 03/07/2015 1113   AST 22 06/22/2011   ALT 51 03/07/2015 1113   BILITOT 0.8 03/07/2015 1113   BILITOT 0.5 06/22/2011      RADIOGRAPHIC STUDIES:  No results found.    ASSESSMENT AND PLAN:  Thrombocytopenia Thrombocytopenia secondary to splenomegaly and fatty infiltration of liver.   Labs were ordered today: CBC diff, LDH.  However, patient reports having recent laboratory work by Dr. [name]. We'll try to ascertain a copy of the laboratory work results. If necessary, we'll recall the patient for laboratory work depending on results and/or particular lab tests.  Stable and remaining near the 100,000 range.  No bleeding episodes. He denies any blood in his stools, black sticky stools, epistaxis, easy bruising, rashes.  Labs in 8 months: CBC diff, LDH  Return in 8 months. Given stability of the patient's platelet  count, we'll continue to monitor laboratory work. I suspect over the next few years, we will prove stability of counts and we can continue to decrease frequency of laboratory work and follow-up appointment. At some point in time, and may be reasonable release the patient back to his primary care physician for further follow-up.  ADDENDUM: Platelet count on 08/18/2015 by Dr. [name] demonstrated a platelet count of 114,000.  This is stable.  No role for labs by [place] and therefore, we will not re-call him.  I will ask nursing to call the patient and let him know that his platelet count is stable.  Appointments as scheduled.  Leukopenia Secondary to fatty infiltration of liver.    THERAPY PLAN:  Continue to monitor counts.  No intervention needed at this time.  All questions were answered. The patient knows to call the clinic with any problems, questions or concerns. We can certainly see the patient much sooner if necessary.  Patient and plan discussed with Dr. Ancil Linsey and she is in agreement with the aforementioned.   This note is electronically signed by: Robynn Pane 09/06/2015 11:44 AM

## 2015-09-03 NOTE — Assessment & Plan Note (Addendum)
Thrombocytopenia secondary to splenomegaly and fatty infiltration of liver.   Labs were ordered today: CBC diff, LDH.  However, patient reports having recent laboratory work by Dr. [name]. We'll try to ascertain a copy of the laboratory work results. If necessary, we'll recall the patient for laboratory work depending on results and/or particular lab tests.  Stable and remaining near the 100,000 range.  No bleeding episodes. He denies any blood in his stools, black sticky stools, epistaxis, easy bruising, rashes.  Labs in 8 months: CBC diff, LDH  Return in 8 months. Given stability of the patient's platelet count, we'll continue to monitor laboratory work. I suspect over the next few years, we will prove stability of counts and we can continue to decrease frequency of laboratory work and follow-up appointment. At some point in time, and may be reasonable release the patient back to his primary care physician for further follow-up.  ADDENDUM: Platelet count on 08/18/2015 by Dr. [name] demonstrated a platelet count of 114,000.  This is stable.  No role for labs by [place] and therefore, we will not re-call him.  I will ask nursing to call the patient and let him know that his platelet count is stable.  Appointments as scheduled.

## 2015-09-03 NOTE — Assessment & Plan Note (Signed)
Secondary to fatty infiltration of liver.

## 2015-09-05 ENCOUNTER — Other Ambulatory Visit (HOSPITAL_COMMUNITY): Payer: 59

## 2015-09-05 ENCOUNTER — Encounter (HOSPITAL_COMMUNITY): Payer: 59 | Attending: Oncology | Admitting: Oncology

## 2015-09-05 ENCOUNTER — Ambulatory Visit (HOSPITAL_COMMUNITY): Payer: 59 | Admitting: Oncology

## 2015-09-05 ENCOUNTER — Ambulatory Visit (HOSPITAL_COMMUNITY): Payer: 59 | Admitting: Hematology & Oncology

## 2015-09-05 ENCOUNTER — Encounter (HOSPITAL_COMMUNITY): Payer: Self-pay | Admitting: Oncology

## 2015-09-05 VITALS — BP 108/73 | HR 100 | Temp 97.8°F | Resp 18 | Wt 161.0 lb

## 2015-09-05 DIAGNOSIS — D696 Thrombocytopenia, unspecified: Secondary | ICD-10-CM | POA: Diagnosis not present

## 2015-09-05 DIAGNOSIS — D72819 Decreased white blood cell count, unspecified: Secondary | ICD-10-CM | POA: Diagnosis not present

## 2015-09-05 NOTE — Patient Instructions (Addendum)
[place] at [facility] Discharge Instructions  RECOMMENDATIONS MADE BY THE CONSULTANT AND ANY TEST RESULTS WILL BE SENT TO YOUR REFERRING PHYSICIAN.   Exam and discussion by Kirby Crigler today We will check with the labs you got at Dr. [name] office, If we dont have a platelet check then we will schedule an appointment to get that done. If you have any abnormal bleeding, gums bleeding, abnormal bruising please call us or go to the Emergency room.  Return to see the doctor in 8 months with labs. Please call the clinic if you have any questions or concerns    Thank you for choosing [place] at [facility] to provide your oncology and hematology care.  To afford each patient quality time with our provider, please arrive at least 15 minutes before your scheduled appointment time.   Beginning January 23rd 2017 lab work for the [facility] will be done in the  Main lab at [facility] on 1st floor. If you have a lab appointment with the [facility] please come in thru the  Main Entrance and check in at the main information desk  You need to re-schedule your appointment should you arrive 10 or more minutes late.  We strive to give you quality time with our providers, and arriving late affects you and other patients whose appointments are after yours.  Also, if you no show three or more times for appointments you may be dismissed from the clinic at the providers discretion.     Again, thank you for choosing [facility].  Our hope is that these requests will decrease the amount of time that you wait before being seen by our physicians.       _____________________________________________________________  Should you have questions after your visit to [facility], please contact our office at [phone] between the hours of 8:30 a.m. and 4:30 p.m.  Voicemails left after 4:30 p.m. will not be returned until the following  business day.  For prescription refill requests, have your pharmacy contact our office.

## 2015-09-06 ENCOUNTER — Ambulatory Visit (HOSPITAL_COMMUNITY): Payer: 59 | Admitting: Oncology

## 2015-09-06 ENCOUNTER — Other Ambulatory Visit (HOSPITAL_COMMUNITY): Payer: 59

## 2015-09-06 ENCOUNTER — Telehealth (HOSPITAL_COMMUNITY): Payer: Self-pay | Admitting: Emergency Medicine

## 2015-09-06 NOTE — Telephone Encounter (Signed)
Notified pt that his platelet level was 114,000, no need to come back for labs, only as scheduled

## 2015-09-08 DIAGNOSIS — D1801 Hemangioma of skin and subcutaneous tissue: Secondary | ICD-10-CM | POA: Diagnosis not present

## 2015-09-08 DIAGNOSIS — L821 Other seborrheic keratosis: Secondary | ICD-10-CM | POA: Diagnosis not present

## 2015-09-08 DIAGNOSIS — L820 Inflamed seborrheic keratosis: Secondary | ICD-10-CM | POA: Diagnosis not present

## 2015-09-22 DIAGNOSIS — Z1211 Encounter for screening for malignant neoplasm of colon: Secondary | ICD-10-CM | POA: Diagnosis not present

## 2015-09-22 DIAGNOSIS — Z0000 Encounter for general adult medical examination without abnormal findings: Secondary | ICD-10-CM | POA: Diagnosis not present

## 2015-09-22 DIAGNOSIS — Z23 Encounter for immunization: Secondary | ICD-10-CM | POA: Diagnosis not present

## 2015-10-07 ENCOUNTER — Encounter (HOSPITAL_COMMUNITY): Payer: Self-pay

## 2015-11-08 ENCOUNTER — Ambulatory Visit (INDEPENDENT_AMBULATORY_CARE_PROVIDER_SITE_OTHER): Payer: PPO | Admitting: Orthopedic Surgery

## 2015-11-08 VITALS — BP 104/69 | Ht 67.0 in | Wt 164.0 lb

## 2015-11-08 DIAGNOSIS — S32009D Unspecified fracture of unspecified lumbar vertebra, subsequent encounter for fracture with routine healing: Secondary | ICD-10-CM | POA: Diagnosis not present

## 2015-11-08 DIAGNOSIS — M542 Cervicalgia: Secondary | ICD-10-CM

## 2015-11-08 NOTE — Patient Instructions (Signed)
PHYSICAL THERAPY SEE PT FOR 2 VISITS - HEP FOR NECK PAIN

## 2015-11-08 NOTE — Progress Notes (Signed)
Chief Complaint  Patient presents with  . Follow-up    Back pain   HPI today's visit was initially scheduled for follow-up after fracture of the L1 spine treated with bracing. The patient has no complaints of numbness or tingling no weakness in his upper or lower extremities he has no gait abnormalities to suggest spinal compression.  He does complain of shoulder pain and neck pain  The pain is located over the right shoulder trapezius muscle and right side of the cervical spine. He does not have pain midline. Quality is dull ache. Severity is moderate. Duration he's had this now ongoing for several weeks. Timing is intermittent. Context no trauma. He does notice when he has to hold his arm up at about 90 flexion his symptoms will come on as such as when he is reading or eating  Review of Systems  Constitutional: Negative for fever, chills, weight loss and malaise/fatigue.  Musculoskeletal: Positive for myalgias, back pain and neck pain.  Neurological: Negative for tingling.    Past Medical History  Diagnosis Date  . PONV (postoperative nausea and vomiting)   . Asthma   . Dysrhythmia   . Diabetes mellitus   . GERD (gastroesophageal reflux disease)   . COPD (chronic obstructive pulmonary disease) ([place])   . Anxiety   . Depression   . Splenomegaly   . Hypertension   . Dyslipidemia   . Fracture 12/15    lower back "L-1"    Past Surgical History  Procedure Laterality Date  . Hernia repair  03/2009  . Vasectomy  1989  . Colonoscopy  08/31/2011    [id number] rectal and colon polyps-treated. Single tubular adenoma. Next TCS 08/2016  . Esophagogastroduodenoscopy (egd) with esophageal dilation N/A 05/15/2013    BZJ:IRCVEL esophagus-status post Venia Minks dilation/Portal gastropathy. Antral erosions-status post biopsy. extrinsic compression along lesser curvature likely secondary to splenomegaly. gastric bx benign  . Cardiac catheterization  10/22/2001    normal coronary arteriea (Dr. Domenic Moras)  . Transthoracic echocardiogram  2011    EF 38-10%, stage 1 diastolic dysfunction, trace TR & pulm valve regurg    Family History  Problem Relation Age of Onset  . Heart disease Mother   . Arthritis    . Lung disease    . Cancer Mother     lymph nodes  . Asthma    . Diabetes Mother   . Kidney disease    . Ovarian cancer Sister   . Colon cancer Neg Hx   . Diabetes Brother   . Heart disease Brother   . Hypertension Brother   . Diabetes Sister   . Diabetes Sister    Social History  Substance Use Topics  . Smoking status: Former Smoker    Quit date: 10/06/2011  . Smokeless tobacco: Former Systems developer     Comment: quit x 2 years  . Alcohol Use: No    Current outpatient prescriptions:  .  albuterol (PROVENTIL) (2.5 MG/3ML) 0.083% nebulizer solution, Take 2.5 mg by nebulization 4 (four) times daily., Disp: , Rfl:  .  ALPRAZolam (XANAX) 0.25 MG tablet, Take 0.25 mg by mouth 2 (two) times daily. , Disp: , Rfl:  .  aspirin EC 81 MG tablet, Take 81 mg by mouth daily., Disp: , Rfl:  .  atorvastatin (LIPITOR) 40 MG tablet, Take 1 tablet (40 mg total) by mouth daily., Disp: 90 tablet, Rfl: 3 .  Choline Fenofibrate (FENOFIBRIC ACID) 135 MG CPDR, TAKE 1 CAPSULE (135 MG TOTAL) BY MOUTH DAILY., Disp:  90 capsule, Rfl: 3 .  diltiazem (DILACOR XR) 240 MG 24 hr capsule, Take 1 capsule (240 mg total) by mouth daily., Disp: 90 capsule, Rfl: 3 .  Empagliflozin (JARDIANCE) 10 MG TABS, Take 10 mg by mouth daily., Disp: , Rfl:  .  HYDROcodone-acetaminophen (NORCO/VICODIN) 5-325 MG per tablet, Take 1 tablet by mouth every 8 (eight) hours as needed for moderate pain., Disp: 90 tablet, Rfl: 0 .  Lancets (ONETOUCH ULTRASOFT) lancets, USE TO CHECK BLOOD SUGER TWICE A DAY, Disp: , Rfl: 5 .  Multiple Vitamin (MULITIVITAMIN WITH MINERALS) TABS, Take 1 tablet by mouth daily., Disp: , Rfl:  .  ONE TOUCH ULTRA TEST test strip, USE TO TEST TWICE A DAY, Disp: , Rfl: 3 .  pantoprazole (PROTONIX) 40 MG tablet, Take  1 tablet (40 mg total) by mouth daily., Disp: 90 tablet, Rfl: 1 .  PROAIR HFA 108 (90 Base) MCG/ACT inhaler, Inhale 2 puffs into the lungs every 4 (four) hours as needed., Disp: , Rfl: 12 .  SitaGLIPtin-MetFORMIN HCl (JANUMET XR) [phone] MG TB24, Take 1 tablet by mouth daily., Disp: , Rfl:  .  tamsulosin (FLOMAX) 0.4 MG CAPS capsule, Take 0.4 mg by mouth daily., Disp: , Rfl:  .  tiotropium (SPIRIVA) 18 MCG inhalation capsule, Place 18 mcg into inhaler and inhale daily., Disp: , Rfl:   BP 104/69 mmHg  Ht 5' 7"  (1.702 m)  Wt 164 lb (74.39 kg)  BMI 25.68 kg/m2  Physical Exam  Constitutional: He is oriented to person, place, and time. He appears well-developed and well-nourished. No distress.  Cardiovascular: Normal rate and intact distal pulses.   Neurological: He is alert and oriented to person, place, and time.  Skin: Skin is warm and dry. No rash noted. He is not diaphoretic. No erythema. No pallor.  Psychiatric: He has a normal mood and affect. His behavior is normal. Judgment and thought content normal.    Ortho Exam Ambulation shows normal heel-to-toe gait pattern. I also see that he has tenderness on the right side of his neck and right trapezius muscle the shoulder is nontender. C-spine is nontender in the midline and has normal range of motion until he turns or bends the neck to the right the muscle tone is increased in the right trapezius muscle and right paraspinal musculature with normal skin. Distal pulses are normal reflexes are 2+ and equal on each side he has no lymphadenopathy  His right shoulder impingement sign is negative his right shoulder stability tests are normal  ASSESSMENT: My personal interpretation of the images:  No imaging was obtained on today  PLAN Lumbar spine fracture with minimal residual discomfort continue heating pad activity modification expect resolution without intervention  Cervical spine pain recommend physical therapy

## 2015-11-15 ENCOUNTER — Other Ambulatory Visit: Payer: Self-pay | Admitting: Internal Medicine

## 2015-11-15 NOTE — Telephone Encounter (Signed)
Rx refill sent to pharmacy. 

## 2015-11-16 ENCOUNTER — Other Ambulatory Visit: Payer: Self-pay

## 2015-11-16 MED ORDER — PANTOPRAZOLE SODIUM 40 MG PO TBEC: 40.0000 mg | DELAYED_RELEASE_TABLET | Freq: Every day | ORAL | Status: DC

## 2015-11-16 NOTE — Telephone Encounter (Signed)
Patient has not been seen in over 2 years. Needs OV.  Refilled his pantoprazole once.

## 2015-12-26 DIAGNOSIS — I10 Essential (primary) hypertension: Secondary | ICD-10-CM | POA: Diagnosis not present

## 2015-12-26 DIAGNOSIS — M545 Low back pain: Secondary | ICD-10-CM | POA: Diagnosis not present

## 2015-12-26 DIAGNOSIS — N401 Enlarged prostate with lower urinary tract symptoms: Secondary | ICD-10-CM | POA: Diagnosis not present

## 2015-12-26 DIAGNOSIS — K219 Gastro-esophageal reflux disease without esophagitis: Secondary | ICD-10-CM | POA: Diagnosis not present

## 2015-12-26 DIAGNOSIS — E119 Type 2 diabetes mellitus without complications: Secondary | ICD-10-CM | POA: Diagnosis not present

## 2016-01-05 ENCOUNTER — Other Ambulatory Visit (HOSPITAL_COMMUNITY): Payer: Self-pay | Admitting: Pulmonary Disease

## 2016-01-05 DIAGNOSIS — M5416 Radiculopathy, lumbar region: Secondary | ICD-10-CM

## 2016-01-17 ENCOUNTER — Ambulatory Visit (HOSPITAL_COMMUNITY)
Admission: RE | Admit: 2016-01-17 | Discharge: 2016-01-17 | Disposition: A | Discharge status: Home / Self Care | Payer: PPO | Source: Ambulatory Visit | Attending: Pulmonary Disease | Admitting: Pulmonary Disease

## 2016-01-17 DIAGNOSIS — M4856XS Collapsed vertebra, not elsewhere classified, lumbar region, sequela of fracture: Secondary | ICD-10-CM | POA: Insufficient documentation

## 2016-01-17 DIAGNOSIS — M545 Low back pain: Secondary | ICD-10-CM | POA: Diagnosis not present

## 2016-01-17 DIAGNOSIS — M5416 Radiculopathy, lumbar region: Secondary | ICD-10-CM | POA: Diagnosis not present

## 2016-01-17 DIAGNOSIS — M40205 Unspecified kyphosis, thoracolumbar region: Secondary | ICD-10-CM | POA: Insufficient documentation

## 2016-02-09 ENCOUNTER — Other Ambulatory Visit: Payer: Self-pay | Admitting: Internal Medicine

## 2016-02-12 ENCOUNTER — Other Ambulatory Visit: Payer: Self-pay | Admitting: Internal Medicine

## 2016-02-13 NOTE — Telephone Encounter (Signed)
Rx(s) sent to pharmacy electronically.  

## 2016-03-14 ENCOUNTER — Other Ambulatory Visit: Payer: Self-pay | Admitting: Internal Medicine

## 2016-03-22 DIAGNOSIS — E1165 Type 2 diabetes mellitus with hyperglycemia: Secondary | ICD-10-CM | POA: Diagnosis not present

## 2016-03-22 DIAGNOSIS — E785 Hyperlipidemia, unspecified: Secondary | ICD-10-CM | POA: Diagnosis not present

## 2016-03-22 DIAGNOSIS — I10 Essential (primary) hypertension: Secondary | ICD-10-CM | POA: Diagnosis not present

## 2016-03-22 DIAGNOSIS — J449 Chronic obstructive pulmonary disease, unspecified: Secondary | ICD-10-CM | POA: Diagnosis not present

## 2016-03-27 DIAGNOSIS — E119 Type 2 diabetes mellitus without complications: Secondary | ICD-10-CM | POA: Diagnosis not present

## 2016-03-27 DIAGNOSIS — I10 Essential (primary) hypertension: Secondary | ICD-10-CM | POA: Diagnosis not present

## 2016-03-27 DIAGNOSIS — D696 Thrombocytopenia, unspecified: Secondary | ICD-10-CM | POA: Diagnosis not present

## 2016-03-27 DIAGNOSIS — K210 Gastro-esophageal reflux disease with esophagitis: Secondary | ICD-10-CM | POA: Diagnosis not present

## 2016-03-27 DIAGNOSIS — Z23 Encounter for immunization: Secondary | ICD-10-CM | POA: Diagnosis not present

## 2016-04-12 ENCOUNTER — Other Ambulatory Visit: Payer: Self-pay | Admitting: Internal Medicine

## 2016-04-16 ENCOUNTER — Ambulatory Visit (INDEPENDENT_AMBULATORY_CARE_PROVIDER_SITE_OTHER): Payer: PPO | Admitting: Nurse Practitioner

## 2016-04-16 ENCOUNTER — Encounter: Payer: Self-pay | Admitting: Nurse Practitioner

## 2016-04-16 DIAGNOSIS — E785 Hyperlipidemia, unspecified: Secondary | ICD-10-CM | POA: Diagnosis not present

## 2016-04-16 DIAGNOSIS — I10 Essential (primary) hypertension: Secondary | ICD-10-CM

## 2016-04-16 MED ORDER — FENOFIBRIC ACID 135 MG PO CPDR: 1.0000 | DELAYED_RELEASE_CAPSULE | Freq: Every day | ORAL | 3 refills | Status: DC

## 2016-04-16 MED ORDER — DILTIAZEM HCL ER COATED BEADS 240 MG PO CP24: 240.0000 mg | ORAL_CAPSULE | Freq: Every day | ORAL | 3 refills | Status: DC

## 2016-04-16 MED ORDER — ATORVASTATIN CALCIUM 40 MG PO TABS: 40.0000 mg | ORAL_TABLET | Freq: Every day | ORAL | 3 refills | Status: DC

## 2016-04-16 NOTE — Progress Notes (Signed)
Office Visit    Patient Name: [name] Date of Encounter: 04/16/2016  Primary Care Provider:  Alonza Bogus, MD Primary Cardiologist:  C. Hilty, MD   Chief Complaint    60 year old male with a history of hyperlipidemia, diabetes, hypertension, chest pain and normal cath in 2003, who presents for annual follow-up.  Past Medical History    Past Medical History:  Diagnosis Date  . Anxiety   . Asthma   . Chest pain    a. 2003 Cath: nl cors.  [name] COPD (chronic obstructive pulmonary disease) ([place])   . Depression   . Diabetes mellitus   . Dyslipidemia   . Dysrhythmia   . Fracture 12/15   lower back "L-1"  . GERD (gastroesophageal reflux disease)   . Hypertension   . PONV (postoperative nausea and vomiting)   . Splenomegaly    Past Surgical History:  Procedure Laterality Date  . CARDIAC CATHETERIZATION  10/22/2001   normal coronary arteriea (Dr. Domenic Moras)  . COLONOSCOPY  08/31/2011   [id number] rectal and colon polyps-treated. Single tubular adenoma. Next TCS 08/2016  . ESOPHAGOGASTRODUODENOSCOPY (EGD) WITH ESOPHAGEAL DILATION N/A 05/15/2013   [id number] esophagus-status post Venia Minks dilation/Portal gastropathy. Antral erosions-status post biopsy. extrinsic compression along lesser curvature likely secondary to splenomegaly. gastric bx benign  . HERNIA REPAIR  03/2009  . TRANSTHORACIC ECHOCARDIOGRAM  2011   EF 35-57%, stage 1 diastolic dysfunction, trace TR & pulm valve regurg   . VASECTOMY  1989    Allergies  Allergies  Allergen Reactions  . Codeine Rash  . Simvastatin Nausea Only    History of Present Illness    60 year old male status post normal catheterization in 2003, performed in the setting of chest pain. He also has a history of hyperlipidemia, diabetes, obesity, remote tobacco abuse, and chronic back pain. He is out on disability related to his back pain. He was last seen about a year and a half ago, and says he has continued to do well. He is  fairly sedentary. He is followed closely by primary care related to his diabetes and also lipids. He is due to have follow-up blood work next week. He has not been having any chest pain. He does express dyspnea on exertion with higher levels of activity. He denies PND, orthopnea, dizziness, syncope, edema, or early satiety. He is compliant with his medications and will need refills on his Lipitor, fenofibrate acid, and diltiazem.  Home Medications    Prior to Admission medications   Medication Sig Start Date End Date Taking? Authorizing Provider  albuterol (PROVENTIL) (2.5 MG/3ML) 0.083% nebulizer solution Take 2.5 mg by nebulization 4 (four) times daily.   Yes Historical Provider, MD  ALPRAZolam (XANAX) 0.25 MG tablet Take 0.25 mg by mouth 2 (two) times daily.    Yes Historical Provider, MD  aspirin EC 81 MG tablet Take 81 mg by mouth daily.   Yes Historical Provider, MD  atorvastatin (LIPITOR) 40 MG tablet Take 1 tablet (40 mg total) by mouth daily. 04/16/16  Yes [name], NP  baclofen (LIORESAL) 20 MG tablet Take 1 tablet by mouth 3 (three) times daily. 04/08/16  Yes Historical Provider, MD  Choline Fenofibrate (FENOFIBRIC ACID) 135 MG CPDR Take 1 capsule by mouth daily. 04/16/16  Yes [name], NP  diltiazem (CARDIZEM CD) 240 MG 24 hr capsule Take 1 capsule (240 mg total) by mouth daily. 04/16/16  Yes [name], NP  Empagliflozin (JARDIANCE) 10 MG TABS Take 10 mg  by mouth daily.   Yes Historical Provider, MD  HYDROcodone-acetaminophen (NORCO/VICODIN) 5-325 MG per tablet Take 1 tablet by mouth every 8 (eight) hours as needed for moderate pain. 11/09/14  Yes [name], MD  Multiple Vitamin (MULITIVITAMIN WITH MINERALS) TABS Take 1 tablet by mouth daily.   Yes Historical Provider, MD  PROAIR HFA 108 (90 Base) MCG/ACT inhaler Inhale 2 puffs into the lungs every 4 (four) hours as needed. 08/29/15  Yes Historical Provider, MD  SitaGLIPtin-MetFORMIN HCl (JANUMET XR)  [phone] MG TB24 Take 1 tablet by mouth daily.   Yes Historical Provider, MD  tamsulosin (FLOMAX) 0.4 MG CAPS capsule Take 0.4 mg by mouth daily.   Yes Historical Provider, MD  tiotropium (SPIRIVA) 18 MCG inhalation capsule Place 18 mcg into inhaler and inhale daily.   Yes Historical Provider, MD    Review of Systems    As above, he has been doing generally well. He has some degree of chronic dyspnea on exertion which is unchanged. He denies PND, orthopnea, dizziness, syncope, edema, early satiety, chest pain, or palpitations. His chronic low back pain.  All other systems reviewed and are otherwise negative except as noted above.  Physical Exam    VS: Blood pressure 114/67, heart rate 87, respirations 16, weight 167.6 pounds GEN: Well nourished, well developed, in no acute distress.  HEENT: normal.  Neck: Supple, no JVD, carotid bruits, or masses. Cardiac: RRR, no murmurs, rubs, or gallops. No clubbing, cyanosis, edema.  Radials/DP/PT 2+ and equal bilaterally.  Respiratory:  Respirations regular and unlabored, clear to auscultation bilaterally. GI: Soft, nontender, nondistended, BS + x 4. MS: no deformity or atrophy. Skin: warm and dry, no rash. Neuro:  Strength and sensation are intact. Psych: Normal affect.  Accessory Clinical Findings    ECG - Regular sinus rhythm, 87, no acute ST or T changes.  Assessment & Plan    1.  History of chest pain: Status post normal catheterization in 2003. He has been medically managed since. He remains on aspirin and statin therapy in the setting of diabetes. He has not been having any chest pain and requires no further workup at this time.  2. Essential HTN:  Stable on CCB therapy.  Refill today.  3.  Dyslipidemia: He remains on Lipitor and TriCor. He is due to have labs with his primary care provider in the next couple of weeks.  4. Type 2 diabetes mellitus: He says his most recent A1c was 6.7 and is due to have this followed up next week. We did  talk about his diet and he points out that his weak spot is rice, which she realizes his high-calorie.  5. Disposition: Follow-up with Dr. Debara Pickett in one year.   Murray Hodgkins, NP 04/16/2016, 5:41 PM

## 2016-04-16 NOTE — Patient Instructions (Signed)
Your physician wants you to follow-up in: [address] will receive a reminder letter in the mail two months in advance. If you don't receive a letter, please call our office to schedule the follow-up appointment.  If you need a refill on your cardiac medications before your next appointment, please call your pharmacy.

## 2016-05-04 ENCOUNTER — Encounter (HOSPITAL_COMMUNITY): Payer: Self-pay | Admitting: Hematology & Oncology

## 2016-05-04 ENCOUNTER — Encounter (HOSPITAL_COMMUNITY): Payer: PPO

## 2016-05-04 ENCOUNTER — Encounter (HOSPITAL_COMMUNITY): Payer: PPO | Attending: Hematology & Oncology | Admitting: Hematology & Oncology

## 2016-05-04 VITALS — BP 110/65 | HR 83 | Temp 98.0°F | Resp 16 | Wt 163.1 lb

## 2016-05-04 DIAGNOSIS — D696 Thrombocytopenia, unspecified: Secondary | ICD-10-CM

## 2016-05-04 DIAGNOSIS — K760 Fatty (change of) liver, not elsewhere classified: Secondary | ICD-10-CM

## 2016-05-04 DIAGNOSIS — K219 Gastro-esophageal reflux disease without esophagitis: Secondary | ICD-10-CM

## 2016-05-04 DIAGNOSIS — R161 Splenomegaly, not elsewhere classified: Secondary | ICD-10-CM | POA: Diagnosis not present

## 2016-05-04 DIAGNOSIS — E119 Type 2 diabetes mellitus without complications: Secondary | ICD-10-CM

## 2016-05-04 DIAGNOSIS — D72819 Decreased white blood cell count, unspecified: Secondary | ICD-10-CM

## 2016-05-04 LAB — CBC WITH DIFFERENTIAL/PLATELET
BASOS ABS: 0 10*3/uL (ref 0.0–0.1)
Basophils Relative: 1 %
EOS ABS: 0.3 10*3/uL (ref 0.0–0.7)
Eosinophils Relative: 7 %
HCT: 48.7 % (ref 39.0–52.0)
Hemoglobin: 15.9 g/dL (ref 13.0–17.0)
LYMPHS ABS: 1.2 10*3/uL (ref 0.7–4.0)
Lymphocytes Relative: 31 %
MCH: 27.3 pg (ref 26.0–34.0)
MCHC: 32.6 g/dL (ref 30.0–36.0)
MCV: 83.7 fL (ref 78.0–100.0)
MONO ABS: 0.5 10*3/uL (ref 0.1–1.0)
MONOS PCT: 13 %
NEUTROS ABS: 1.8 10*3/uL (ref 1.7–7.7)
Neutrophils Relative %: 48 %
PLATELETS: 93 10*3/uL — AB (ref 150–400)
RBC: 5.82 MIL/uL — AB (ref 4.22–5.81)
RDW: 14.2 % (ref 11.5–15.5)
WBC: 3.8 10*3/uL — AB (ref 4.0–10.5)

## 2016-05-04 LAB — LACTATE DEHYDROGENASE: LDH: 118 U/L (ref 98–192)

## 2016-05-04 NOTE — Patient Instructions (Signed)
[place] at [facility] Discharge Instructions  RECOMMENDATIONS MADE BY THE CONSULTANT AND ANY TEST RESULTS WILL BE SENT TO YOUR REFERRING PHYSICIAN.  You saw Dr.Penland today. Follow up in 6 months with lab work. Refer back to GI for GERD. See Amy at checkout for appointments.  Thank you for choosing [place] at [facility] to provide your oncology and hematology care.  To afford each patient quality time with our provider, please arrive at least 15 minutes before your scheduled appointment time.   Beginning January 23rd 2017 lab work for the [facility] will be done in the  Main lab at [facility] on 1st floor. If you have a lab appointment with the [facility] please come in thru the  Main Entrance and check in at the main information desk  You need to re-schedule your appointment should you arrive 10 or more minutes late.  We strive to give you quality time with our providers, and arriving late affects you and other patients whose appointments are after yours.  Also, if you no show three or more times for appointments you may be dismissed from the clinic at the providers discretion.     Again, thank you for choosing [facility].  Our hope is that these requests will decrease the amount of time that you wait before being seen by our physicians.       _____________________________________________________________  Should you have questions after your visit to [facility], please contact our office at [phone] between the hours of 8:30 a.m. and 4:30 p.m.  Voicemails left after 4:30 p.m. will not be returned until the following business day.  For prescription refill requests, have your pharmacy contact our office.         Resources For Cancer Patients and their Caregivers ? American Cancer Society: Can assist with transportation, wigs, general needs, runs Look Good Feel Better.         507-289-8799 ? Cancer Care: Provides financial assistance, online support groups, medication/co-pay assistance.  1-800-813-HOPE (610)159-5539) ? [place] Assists [place] cancer patients and their families through emotional , educational and financial support.  707-510-5798 ? [place] DSS Where to apply for food stamps, Medicaid and utility assistance. (708)157-2138 ? [facility]: Transportation to medical appointments. (303)694-2186 ? Social Security Administration: May apply for disability if have a Stage IV cancer. 716-547-7323 973-243-2915 ? [employer], Disability and Transit Services: Assists with nutrition, care and transit needs. [place] Support Programs: @10RELATIVEDAYS @ > Cancer Support Group  2nd Tuesday of the month 1pm-2pm, Journey Room  > Creative Journey  3rd Tuesday of the month 1130am-1pm, Journey Room  > Look Good Feel Better  1st Wednesday of the month 10am-12 noon, Journey Room (Call [place] to register [phone])

## 2016-05-04 NOTE — Progress Notes (Signed)
Christopher Bogus, MD [name] [id number]  No diagnosis found.  CURRENT THERAPY: Observation  INTERVAL HISTORY: [name] 60 y.o. male returns for followup of splenomegaly with mild thrombocytopenia/leukopenia in the setting of diabetes and intestinal paresis. [place] in past demonstrated cholelithiasis along with fatty liver by ultrasound.    [name] returns to the [facility] today unaccompanied. I personally reviewed and went over laboratory studies with the patient.  His back is doing "so-so". The pain is not any better.   He has not been smoking or drinking. His appetite is pretty good, "Sometimes I eat too much".  He notes his legs become "puffy" during the day. Swelling is improved each am upon awakening.   He reports acid reflux. He was previously on a medication that he was told would cause dementia. He cannot recall who told him this about his medication. He has been taking alka seltzer plus once every other day - "It all depends". He notes that his reflux can be quite severe at times.   His PCP is Dr. Luan [name].  He has received a flu shot this year.  He denies chest pain, abdominal pain, blood in stool, or hematuria.  No fever of chills. No other major complaints or concerns.   Past Medical History:  Diagnosis Date  . Anxiety   . Asthma   . Chest pain    a. 2003 Cath: nl cors.  [name] COPD (chronic obstructive pulmonary disease) ([place])   . Depression   . Diabetes mellitus   . Dyslipidemia   . Dysrhythmia   . Fracture 12/15   lower back "L-1"  . GERD (gastroesophageal reflux disease)   . Hypertension   . PONV (postoperative nausea and vomiting)   . Splenomegaly     has Bilateral leg pain; Back pain with radiation; DDD (degenerative disc disease), lumbar; Lumbago; Muscle weakness (generalized); Abdominal bloating; Abdominal pain, generalized; Non-alcoholic fatty liver disease; Splenomegaly; Lower abdominal pain;  Abnormal LFTs; GERD (gastroesophageal reflux disease); Unspecified constipation; Leukopenia; Thrombocytopenia ([place]); HTN (hypertension); Dyslipidemia; DM2 (diabetes mellitus, type 2) ([place]); and Hyperproteinemia on his problem list.     is allergic to codeine and simvastatin.  [name] had no medications administered during this visit.  Past Surgical History:  Procedure Laterality Date  . CARDIAC CATHETERIZATION  10/22/2001   normal coronary arteriea (Dr. Domenic Moras)  . COLONOSCOPY  08/31/2011   MVH:[id number] rectal and colon polyps-treated. Single tubular adenoma. Next TCS 08/2016  . ESOPHAGOGASTRODUODENOSCOPY (EGD) WITH ESOPHAGEAL DILATION N/A 05/15/2013   MWU:[id number] esophagus-status post Venia Minks dilation/Portal gastropathy. Antral erosions-status post biopsy. extrinsic compression along lesser curvature likely secondary to splenomegaly. gastric bx benign  . HERNIA REPAIR  03/2009  . TRANSTHORACIC ECHOCARDIOGRAM  2011   EF 10-27%, stage 1 diastolic dysfunction, trace TR & pulm valve regurg   . VASECTOMY  1989    Review of Systems  Constitutional: Negative.   HENT: Negative.   Eyes: Negative.   Respiratory: Negative.   Cardiovascular: Positive for leg swelling. Negative for chest pain.  Gastrointestinal: Positive for heartburn. Negative for abdominal pain and blood in stool.  Genitourinary: Negative.  Negative for hematuria.  Musculoskeletal: Positive for back pain (unchanged).  Skin: Negative.   Neurological: Negative.   Endo/Heme/Allergies: Negative.   Psychiatric/Behavioral: Negative.   All other systems reviewed and are negative.   PHYSICAL EXAMINATION  ECOG PERFORMANCE STATUS: 0 - Asymptomatic  Vitals:   05/04/16 1150  BP:  110/65  Pulse: 83  Resp: 16  Temp: 98 F (36.7 C)    Physical Exam  Constitutional: He is oriented to person, place, and time and well-developed, well-nourished, and in no distress.  HENT:  Head: Normocephalic and atraumatic.  Mouth/Throat:  Oropharynx is clear and moist.  Eyes: Conjunctivae and EOM are normal. Pupils are equal, round, and reactive to light.  Neck: Normal range of motion. Neck supple.  Cardiovascular: Normal rate, regular rhythm and normal heart sounds.   Pulmonary/Chest: Effort normal and breath sounds normal.  Abdominal: Soft. Bowel sounds are normal.  Musculoskeletal: Normal range of motion.  Neurological: He is alert and oriented to person, place, and time. Gait normal.  Skin: Skin is warm and dry.  Nursing note and vitals reviewed.   LABORATORY DATA: I have reviewed the data as listed. CBC    Component Value Date/Time   WBC 3.8 (L) 05/04/2016 0941   RBC 5.82 (H) 05/04/2016 0941   HGB 15.9 05/04/2016 0941   HCT 48.7 05/04/2016 0941   PLT 93 (L) 05/04/2016 0941   MCV 83.7 05/04/2016 0941   MCH 27.3 05/04/2016 0941   MCHC 32.6 05/04/2016 0941   RDW 14.2 05/04/2016 0941   LYMPHSABS 1.2 05/04/2016 0941   MONOABS 0.5 05/04/2016 0941   EOSABS 0.3 05/04/2016 0941   BASOSABS 0.0 05/04/2016 0941      Chemistry      Component Value Date/Time   NA 141 03/07/2015 1113   K 4.7 03/07/2015 1113   CL 108 03/07/2015 1113   CO2 26 03/07/2015 1113   BUN 18 03/07/2015 1113   CREATININE 1.03 03/07/2015 1113   CREATININE 0.86 04/24/2013 0756      Component Value Date/Time   CALCIUM 9.3 03/07/2015 1113   ALKPHOS 59 03/07/2015 1113   ALKPHOS 41 06/22/2011   AST 29 03/07/2015 1113   AST 22 06/22/2011   ALT 51 03/07/2015 1113   BILITOT 0.8 03/07/2015 1113   BILITOT 0.5 06/22/2011      RADIOGRAPHIC STUDIES: I have personally reviewed the radiological images as listed and agreed with the findings in the report. Study Result   CLINICAL DATA:  Low back pain radiating to the right leg over the last 2 years. [place] 2 years ago with spinal fracture.  EXAM: MRI LUMBAR SPINE WITHOUT CONTRAST  TECHNIQUE: Multiplanar, multisequence MR imaging of the lumbar spine was performed. No intravenous contrast  was administered.  COMPARISON:  09/21/2014.  06/11/2014.  FINDINGS: Segmentation:  5 lumbar type vertebral bodies.  Alignment:  No scoliosis.  Kyphotic curvature at the L1 level.  Vertebrae: Old healed compression fracture at L1 with loss of height anteriorly of 70%. Mild posterior bowing of the posterior superior margin of the vertebral body but no significant encroachment upon the spinal canal. No other focal bone finding.  Conus medullaris: Extends to the L1 level and appears normal.  Paraspinal and other soft tissues: Normal  Disc levels:  T12-L1:  Mild desiccation.  No bulge or herniation.  No stenosis.  L1-2: Mild desiccation and minimal bulging. No stenosis or neural compression.  L2-3: Minimal bulging of the disc. No stenosis or neural compression.  L3-4:  Normal interspace.  L4-5: Minimal annular fissures on the right in the foraminal region. No stenosis or neural compression. The annular fissures could be associated with neural irritation. The  L5-S1:  Normal disc.  Minimal facet hypertrophy.  No stenosis.  IMPRESSION: Old healed compression fracture at L1 with loss of height anteriorly of 70%.  Minimal posterior bowing of the posterior margin of the vertebral body but no compressive narrowing of the canal. Kyphotic curvature the spine at this level but without evidence of edematous facet arthropathy.  Right posterior lateral annular fissures at L4-5. No disc herniation or neural compression. Annular fissures can be associated with nerve irritation, which in this case would affect the right L4 nerve root.   Electronically Signed   By: Nelson Chimes M.D.   On: 01/17/2016 08:27    ENDOSCOPY PROCEDURE REPORT  PATIENT:  Alison, Breeding    MR#: [id number] BIRTHDATE:     [birth year] , 57  yrs. old     GENDER:   Male ENDOSCOPIST:   R.  Garfield Cornea, MD FACP FACG REFERRED BY:  Sinda Du, M.D. PROCEDURE DATE:  05/15/2013 PROCEDURE:       EGD with Venia Minks dilation followed by gastric biopsy  INDICATIONS:     abdominal bloating/esophageal dysphagia  INFORMED CONSENT:   The risks, benefits, limitations, alternatives and imponderables have been discussed.  The potential for biopsy, esophogeal dilation, etc. have also been reviewed.  Questions have been answered.  All parties agreeable.  Please see the history and physical in the medical record for more information.  MEDICATIONS: Versed 3 mg IV and Demerol 50 mg IV in divided doses.  DESCRIPTION OF PROCEDURE:   The EG-[id number] ([id number])  endoscope was introduced through the mouth and advanced to the second portion of the duodenum without difficulty or limitations.  The mucosal surfaces were surveyed very carefully during advancement of the scope and upon withdrawal.  Retroflexion view of the proximal stomach and esophagogastric junction was performed.    FINDINGS:   Normal-appearing tubular esophagus. No varices. Stomach empty. Some extrinsic Compression along the  lesser curvature -likely secondary to splenomegaly. Scattered antral erosions. Some "snake skinning" or"fishscale "appearance of the fundal/body mucosa consistent with portal gastropathy. No ulcer or infiltrating process. Patent pylorus.  Normal first and second portion of the duodenum  THERAPEUTIC / DIAGNOSTIC MANEUVERS PERFORMED:   A 56 French Maloney dilator was passed to full insertion easily. A look back revealed no apparent complication related to this maneuver. Subsequently, biopsies of the abnormal antral mucosa taken for histologic study.   COMPLICATIONS:  None  IMPRESSION:     Normal esophagus-status post Maloney dilation. Portal gastropathy. Antral erosions-status post biopsy  RECOMMENDATIONS:      Followup on pathology.    _______________________________ R. Garfield Cornea, MD FACP [facility] eSigned:  R. Garfield Cornea, MD [facility] 05/15/2013 12:44 PM    ASSESSMENT AND PLAN:    Splenomegaly Thrombocytopenia secondary to above Diabetes Fatty Liver noted on ultrasound Leukopenia GERD   Labs reviewed with the patient. Results are noted above. Platelet count and WBC count is overall fairly stable. Would recommend observation only. Blood count findings most likely secondary to mild splenomegaly and hepatic steatosis.   He follows with Dr. Luan [name] for primary care needs.  He complains of acid reflux. He is taking alka seltzer plus about once every other day. He had an EGD with Dr. [name] on 05/15/2013. I have referred him back to Dr. [name].  He is to contact our office if he notices nose bleeding, gum bleeding, or abnormal bruising.  He will return for follow up in 6 months with labs.   THERAPY PLAN:  Continue to monitor counts.  No intervention needed at this time.  All questions were answered. The patient knows to call the clinic with any problems, questions or concerns. We  can certainly see the patient much sooner if necessary.  This document serves as a record of services personally performed by Ancil Linsey, MD. It was created on her behalf by Arlyce Harman, a trained medical scribe. The creation of this record is based on the scribe's personal observations and the provider's statements to them. This document has been checked and approved by the attending provider.  I have reviewed the above documentation for accuracy and completeness and I agree with the above.  This note is electronically signed by: Molli Hazard 12:20 PM

## 2016-05-07 ENCOUNTER — Encounter: Payer: Self-pay | Admitting: Internal Medicine

## 2016-05-10 ENCOUNTER — Other Ambulatory Visit: Payer: Self-pay | Admitting: Internal Medicine

## 2016-05-16 ENCOUNTER — Other Ambulatory Visit: Payer: Self-pay | Admitting: Internal Medicine

## 2016-05-16 NOTE — Telephone Encounter (Signed)
REFILL 

## 2016-05-22 ENCOUNTER — Ambulatory Visit: Payer: PPO | Admitting: Gastroenterology

## 2016-06-02 ENCOUNTER — Encounter (HOSPITAL_COMMUNITY): Payer: Self-pay | Admitting: Hematology & Oncology

## 2016-06-04 ENCOUNTER — Ambulatory Visit (INDEPENDENT_AMBULATORY_CARE_PROVIDER_SITE_OTHER): Payer: PPO | Admitting: Gastroenterology

## 2016-06-04 ENCOUNTER — Encounter: Payer: Self-pay | Admitting: Gastroenterology

## 2016-06-04 VITALS — BP 109/69 | HR 100 | Temp 97.7°F | Ht 67.0 in | Wt 159.6 lb

## 2016-06-04 DIAGNOSIS — K219 Gastro-esophageal reflux disease without esophagitis: Secondary | ICD-10-CM

## 2016-06-04 DIAGNOSIS — K760 Fatty (change of) liver, not elsewhere classified: Secondary | ICD-10-CM | POA: Diagnosis not present

## 2016-06-04 MED ORDER — PANTOPRAZOLE SODIUM 40 MG PO TBEC: 40.0000 mg | DELAYED_RELEASE_TABLET | Freq: Every day | ORAL | 1 refills | Status: DC

## 2016-06-04 NOTE — Patient Instructions (Signed)
1. Start pantoprazole once daily for acid reflux.  2. Return to the office in one month or call sooner if ongoing symptoms.

## 2016-06-04 NOTE — Progress Notes (Signed)
Primary Care Physician: Alonza Bogus, MD  Primary Gastroenterologist:  Garfield Cornea, MD   Chief Complaint  Patient presents with  . Gastroesophageal Reflux    off of PPI    HPI: [name] is a 60 y.o. male here for follow up of GERD at the request of Dr. Whitney Muse. Last seen in 06/2013. H/O EGD in 2014 for abdominal pain/bloating. He had gastritis, extrinsic compression on stomach due to splenomegaly, portal gastropathy. Biopsies were benign. Extensive work-up has included abd u/s X 2, CT A/P, MRI Abd. I personally reviewed last two u/s with Dr. Thornton Papas back in 05/2013. Felt to have new splenomegaly since 11/2012 to March 11, 2013. Patient reports sister died of cirrhosis at age 30, no h/o etoh. She was diabetic.   When we last saw him he was on pantoprazole. Patient states he stopped acid reflux pill recently because someone who called him about all of his medications (he cannot remember who), told him his acid reflux pill has been linked to dementia.   Since being off his PPI, he has had frequent nighttime heartburn. Wakes up with it. Burns up to throat. Over the past month, he has had problems swallowing bread, has to wash it down. "I had no problems until I stopped the medication". BM regular. No melena, brbpr. Taking [place] for heartburn but not helping.    Due for surveillance colonoscopy 08/2016 for h/o adenomatous colon polyps.   Current Outpatient Prescriptions  Medication Sig Dispense Refill  . ALPRAZolam (XANAX) 0.25 MG tablet Take 0.25 mg by mouth 2 (two) times daily.     . Artificial Tear Ointment (REFRESH P.M. OP) Apply to eye. As directed    . aspirin EC 81 MG tablet Take 81 mg by mouth daily.    [name] atorvastatin (LIPITOR) 40 MG tablet Take 1 tablet (40 mg total) by mouth daily. 90 tablet 3  . baclofen (LIORESAL) 20 MG tablet Take 1 tablet by mouth 3 (three) times daily.    . Choline Fenofibrate (FENOFIBRIC ACID) 135 MG CPDR Take 1 capsule by mouth daily. 90 capsule  3  . clotrimazole (LOTRIMIN) 1 % cream Apply 1 application topically daily as needed.    . diltiazem (CARDIZEM CD) 240 MG 24 hr capsule Take 1 capsule (240 mg total) by mouth daily. 30 capsule 11  . Empagliflozin (JARDIANCE) 10 MG TABS Take 10 mg by mouth daily.    [name] HYDROcodone-acetaminophen (NORCO/VICODIN) 5-325 MG per tablet Take 1 tablet by mouth every 8 (eight) hours as needed for moderate pain. 90 tablet 0  . Multiple Vitamin (MULITIVITAMIN WITH MINERALS) TABS Take 1 tablet by mouth daily.    [name] PROAIR HFA 108 (90 Base) MCG/ACT inhaler Inhale 2 puffs into the lungs every 4 (four) hours as needed.  12  . SitaGLIPtin-MetFORMIN HCl (JANUMET XR) [id number] MG TB24 Take 1 tablet by mouth daily.    . tamsulosin (FLOMAX) 0.4 MG CAPS capsule Take 0.4 mg by mouth daily.    [name] tiotropium (SPIRIVA) 18 MCG inhalation capsule Place 18 mcg into inhaler and inhale daily.    [name] aspirin EC 81 MG tablet Take 81 mg by mouth daily.     No current facility-administered medications for this visit.     Allergies as of 06/04/2016 - Review Complete 06/04/2016  Allergen Reaction Noted  . Codeine Rash 07/24/2011  . Simvastatin Nausea Only 10/05/2013   Past Medical History:  Diagnosis Date  . Anxiety   . Asthma   .  Chest pain    a. 2003 Cath: nl cors.  [name] COPD (chronic obstructive pulmonary disease) ([place])   . Depression   . Diabetes mellitus   . Dyslipidemia   . Dysrhythmia   . Fracture 12/15   lower back "L-1"  . GERD (gastroesophageal reflux disease)   . Hypertension   . PONV (postoperative nausea and vomiting)   . Splenomegaly    Past Surgical History:  Procedure Laterality Date  . CARDIAC CATHETERIZATION  10/22/2001   normal coronary arteriea (Dr. Domenic Moras)  . COLONOSCOPY  08/31/2011   EFE:[id number] rectal and colon polyps-treated. Single tubular adenoma. Next TCS 08/2016  . ESOPHAGOGASTRODUODENOSCOPY (EGD) WITH ESOPHAGEAL DILATION N/A 05/15/2013   OIT:GPQDIY esophagus-status post Venia Minks  dilation/Portal gastropathy. Antral erosions-status post biopsy. extrinsic compression along lesser curvature likely secondary to splenomegaly. gastric bx benign  . HERNIA REPAIR  03/2009  . TRANSTHORACIC ECHOCARDIOGRAM  2011   EF 64-15%, stage 1 diastolic dysfunction, trace TR & pulm valve regurg   . VASECTOMY  1989    ROS:  General: Negative for anorexia, weight loss, fever, chills, fatigue, weakness. ENT: Negative for hoarseness,  nasal congestion. CV: Negative for chest pain, angina, palpitations, dyspnea on exertion, peripheral edema.  Respiratory: Negative for dyspnea at rest, dyspnea on exertion, cough, sputum, wheezing.  GI: See history of present illness. GU:  Negative for dysuria, hematuria, urinary incontinence, urinary frequency, nocturnal urination.  Endo: Negative for unusual weight change.    Physical Examination:   BP 109/69   Pulse 100   Temp 97.7 F (36.5 C) (Oral)   Ht 5' 7"  (1.702 m)   Wt 159 lb 9.6 oz (72.4 kg)   BMI 25.00 kg/m   General: Well-nourished, well-developed in no acute distress.  Eyes: No icterus. Mouth: Oropharyngeal mucosa moist and pink , no lesions erythema or exudate. Lungs: Clear to auscultation bilaterally.  Heart: Regular rate and rhythm, no murmurs rubs or gallops.  Abdomen: Bowel sounds are normal, nontender, nondistended, no hepatomegaly or masses, no abdominal bruits or hernia , no rebound or guarding.  Spleen tip palpated?  Extremities: trace lower extremity edema. No clubbing or deformities. Neuro: Alert and oriented x 4   Skin: Warm and dry, no jaundice.   Psych: Alert and cooperative, normal mood and affect.  Labs:    Lab Results  Component Value Date   WBC 3.8 (L) 05/04/2016   HGB 15.9 05/04/2016   HCT 48.7 05/04/2016   MCV 83.7 05/04/2016   PLT 93 (L) 05/04/2016   No results found for: INR, PROTIME  Labs from PCP dated 03/22/16 WBC 3800 H/H 16/47.1 Platelets 106,000  BUN 19, creatinine 1.16, total bilirubin 0.7,  alkaline phosphatase 44, AST 32, ALT 41, albumin 4.4, hemoglobin A1c 6.8.  Imaging Studies: No results found.

## 2016-06-04 NOTE — Assessment & Plan Note (Signed)
Doing well on ppi but recurrent symptoms when he stopped his medication several months ago. He was told about possible link between ppi and dementia. Discussed at length with patient today. He has had significant recurrent symptoms off of medication including solid food dysphagia which possibly related to reflux esophagitis. Our options at this point are to resume PPI therapy, this time benefits likely outweigh potential risk. Other options would be H2 blocker such as ranitidine at bedtime since his symptoms are mostly nocturnal.  Patient like to resume PPI. We will resume pantoprazole 40 mg daily. He will come back in 4 weeks for follow-up. If continues to have symptoms at that time, consider EGD. Please note, patient states he has significant nausea/vomiting with his prior EGD and colonoscopies in the past.  Patient also has a history of adenomatous colon polyps. Due for colonoscopy in February 2018. Based on symptoms, he may require repeat upper endoscopy which could be done at the same time. Plan for follow-up in 4 weeks as scheduled.

## 2016-06-04 NOTE — Assessment & Plan Note (Signed)
Family history of cirrhosis, sister who is a diabetic. Patient has chronic splenomegaly and thrombocytopenia without overt cirrhosis noted on prior imaging studies. Ultrasound with elastography in 2015, he had F0 score. However he has had some portal gastropathy on EGD several years ago. Cannot exclude underlying advanced liver disease and given his family history would continue close follow-up.  Consider re-imaging of the liver via ultrasound next year. Return to the office in 4 weeks.

## 2016-06-05 NOTE — Progress Notes (Signed)
cc'ed to pcp °

## 2016-06-07 DIAGNOSIS — I10 Essential (primary) hypertension: Secondary | ICD-10-CM | POA: Diagnosis not present

## 2016-06-07 DIAGNOSIS — E119 Type 2 diabetes mellitus without complications: Secondary | ICD-10-CM | POA: Diagnosis not present

## 2016-06-07 DIAGNOSIS — J441 Chronic obstructive pulmonary disease with (acute) exacerbation: Secondary | ICD-10-CM | POA: Diagnosis not present

## 2016-06-21 DIAGNOSIS — E119 Type 2 diabetes mellitus without complications: Secondary | ICD-10-CM | POA: Diagnosis not present

## 2016-06-21 DIAGNOSIS — K210 Gastro-esophageal reflux disease with esophagitis: Secondary | ICD-10-CM | POA: Diagnosis not present

## 2016-06-21 DIAGNOSIS — D696 Thrombocytopenia, unspecified: Secondary | ICD-10-CM | POA: Diagnosis not present

## 2016-06-21 DIAGNOSIS — I10 Essential (primary) hypertension: Secondary | ICD-10-CM | POA: Diagnosis not present

## 2016-06-26 DIAGNOSIS — E119 Type 2 diabetes mellitus without complications: Secondary | ICD-10-CM | POA: Diagnosis not present

## 2016-06-26 DIAGNOSIS — I10 Essential (primary) hypertension: Secondary | ICD-10-CM | POA: Diagnosis not present

## 2016-06-26 DIAGNOSIS — D696 Thrombocytopenia, unspecified: Secondary | ICD-10-CM | POA: Diagnosis not present

## 2016-06-26 DIAGNOSIS — J449 Chronic obstructive pulmonary disease, unspecified: Secondary | ICD-10-CM | POA: Diagnosis not present

## 2016-07-05 ENCOUNTER — Other Ambulatory Visit: Payer: Self-pay

## 2016-07-05 ENCOUNTER — Ambulatory Visit (INDEPENDENT_AMBULATORY_CARE_PROVIDER_SITE_OTHER): Payer: PPO | Admitting: Gastroenterology

## 2016-07-05 ENCOUNTER — Telehealth: Payer: Self-pay

## 2016-07-05 ENCOUNTER — Encounter: Payer: Self-pay | Admitting: Gastroenterology

## 2016-07-05 VITALS — BP 116/65 | HR 103 | Temp 97.8°F | Ht 67.0 in | Wt 157.0 lb

## 2016-07-05 DIAGNOSIS — R161 Splenomegaly, not elsewhere classified: Secondary | ICD-10-CM

## 2016-07-05 DIAGNOSIS — K760 Fatty (change of) liver, not elsewhere classified: Secondary | ICD-10-CM | POA: Diagnosis not present

## 2016-07-05 DIAGNOSIS — Z86010 Personal history of colonic polyps: Secondary | ICD-10-CM

## 2016-07-05 DIAGNOSIS — K219 Gastro-esophageal reflux disease without esophagitis: Secondary | ICD-10-CM | POA: Diagnosis not present

## 2016-07-05 MED ORDER — ONDANSETRON 4 MG PO TBDP: 4.0000 mg | ORAL_TABLET | Freq: Three times a day (TID) | ORAL | 0 refills | Status: DC | PRN

## 2016-07-05 MED ORDER — PEG 3350-KCL-NA BICARB-NACL 420 G PO SOLR: 4000.0000 mL | ORAL | 0 refills | Status: DC

## 2016-07-05 NOTE — Telephone Encounter (Signed)
PA info faxed to [facility] for Colonoscopy and Ultrasound Abdomen Complete with Elastography.

## 2016-07-05 NOTE — Assessment & Plan Note (Signed)
Doing very well now back on his pantoprazole 40 mg daily. Patient is comfortable with continuing the medication since he has recognized how quickly his symptoms became out-of-control off the PPI. Previously he stopped because someone instructed him to because of potential link between PPI and dementia. Given these doing well this time, no need for upper endoscopy. However I did tell the patient that if he has any evidence of cirrhosis on his upcoming ultrasound, we would consider upper endoscopy at time of colonoscopy to screen for esophageal varices. Patient voiced understanding.

## 2016-07-05 NOTE — Progress Notes (Signed)
CC'D TO PCP °

## 2016-07-05 NOTE — Progress Notes (Signed)
Primary Care Physician: Alonza Bogus, MD  Primary Gastroenterologist:  Garfield Cornea, MD   Chief Complaint  Patient presents with  . Gastroesophageal Reflux    HPI: [name] is a 60 y.o. male here for follow-up GERD. He was seen 06/04/2016.H/O EGD in 2014 for abdominal pain/bloating. He had gastritis, extrinsic compression on stomach due to splenomegaly, portal gastropathy. Biopsies were benign. Extensive work-up has included abd u/s X 2, CT A/P, MRI Abd. I personally reviewed last two u/s with Dr. Thornton Papas back in 05/2013. Felt to have new splenomegaly since 11/2012 to 02/2013. Patient reports sister diagnosed with cirrhosis in her mid-50s, no h/o etoh. She was diabetic.   When I saw him last month, he had had recurrent reflux symptoms after stopping his pantoprazole. He told me that someone called him about his medications, advised him that acid reflux medication had been linked to dementia and he should stop it. When I saw him he was waking up with heartburn. He was having difficulty swallowing bread. He reported having no problems until he stopped this medication. He is due for surveillance colonoscopy in February 2018 for history of adenomatous colon polyps  Patient presents today stating his heartburn, dysphagia all resolved after resuming pantoprazole. No abd pain. Appetite good. BM regular. No melena, brbpr.   Current Outpatient Prescriptions  Medication Sig Dispense Refill  . ALPRAZolam (XANAX) 0.25 MG tablet Take 0.25 mg by mouth 2 (two) times daily.     . Artificial Tear Ointment (REFRESH P.M. OP) Apply to eye. As directed    . aspirin EC 81 MG tablet Take 81 mg by mouth daily.    [name] atorvastatin (LIPITOR) 40 MG tablet Take 1 tablet (40 mg total) by mouth daily. 90 tablet 3  . baclofen (LIORESAL) 20 MG tablet Take 1 tablet by mouth 3 (three) times daily.    . Choline Fenofibrate (FENOFIBRIC ACID) 135 MG CPDR Take 1 capsule by mouth daily. 90 capsule 3  . clotrimazole  (LOTRIMIN) 1 % cream Apply 1 application topically daily as needed.    . diltiazem (CARDIZEM CD) 240 MG 24 hr capsule Take 1 capsule (240 mg total) by mouth daily. 30 capsule 11  . Empagliflozin (JARDIANCE) 10 MG TABS Take 10 mg by mouth daily.    . Multiple Vitamin (MULITIVITAMIN WITH MINERALS) TABS Take 1 tablet by mouth daily.    . pantoprazole (PROTONIX) 40 MG tablet Take 1 tablet (40 mg total) by mouth daily. 90 tablet 1  . PROAIR HFA 108 (90 Base) MCG/ACT inhaler Inhale 2 puffs into the lungs every 4 (four) hours as needed.  12  . SitaGLIPtin-MetFORMIN HCl (JANUMET XR) [phone] MG TB24 Take 1 tablet by mouth daily.    . tamsulosin (FLOMAX) 0.4 MG CAPS capsule Take 0.4 mg by mouth daily.    [name] tiotropium (SPIRIVA) 18 MCG inhalation capsule Place 18 mcg into inhaler and inhale daily.    [name] HYDROcodone-acetaminophen (NORCO/VICODIN) 5-325 MG per tablet Take 1 tablet by mouth every 8 (eight) hours as needed for moderate pain. (Patient not taking: Reported on 07/05/2016) 90 tablet 0   No current facility-administered medications for this visit.     Allergies as of 07/05/2016 - Review Complete 07/05/2016  Allergen Reaction Noted  . Codeine Rash 07/24/2011  . Simvastatin Nausea Only 10/05/2013   Past Medical History:  Diagnosis Date  . Anxiety   . Asthma   . Chest pain    a. 2003 Cath: nl cors.  [name]  COPD (chronic obstructive pulmonary disease) ([place])   . Depression   . Diabetes mellitus   . Dyslipidemia   . Dysrhythmia   . Fracture 12/15   lower back "L-1"  . GERD (gastroesophageal reflux disease)   . Hypertension   . PONV (postoperative nausea and vomiting)   . Splenomegaly    Past Surgical History:  Procedure Laterality Date  . CARDIAC CATHETERIZATION  10/22/2001   normal coronary arteriea (Dr. Domenic Moras)  . COLONOSCOPY  08/31/2011   [id number] rectal and colon polyps-treated. Single tubular adenoma. Next TCS 08/2016  . ESOPHAGOGASTRODUODENOSCOPY (EGD) WITH ESOPHAGEAL DILATION  N/A 05/15/2013   [id number] esophagus-status post Venia Minks dilation/Portal gastropathy. Antral erosions-status post biopsy. extrinsic compression along lesser curvature likely secondary to splenomegaly. gastric bx benign  . HERNIA REPAIR  03/2009  . TRANSTHORACIC ECHOCARDIOGRAM  2011   EF 29-56%, stage 1 diastolic dysfunction, trace TR & pulm valve regurg   . VASECTOMY  1989   Family History  Problem Relation Age of Onset  . Heart disease Mother   . Cancer Mother     lymph nodes  . Diabetes Mother   . Arthritis    . Lung disease    . Asthma    . Kidney disease    . Ovarian cancer Sister   . Diabetes Brother   . Heart disease Brother   . Hypertension Brother   . Diabetes Sister   . Diabetes Sister   . Cirrhosis Sister 58    no etoh  . Colon cancer Neg Hx    Social History   Social History  . Marital status: Married    Spouse name: N/A  . Number of children: N/A  . Years of education: N/A   Social History Main Topics  . Smoking status: Former Smoker    Quit date: 10/06/2011  . Smokeless tobacco: Former Systems developer     Comment: quit x 2 years  . Alcohol use No  . Drug use: No  . Sexual activity: Not Asked   Other Topics Concern  . None   Social History Narrative  . None    ROS:  General: Negative for anorexia, weight loss, fever, chills, fatigue, weakness. ENT: Negative for hoarseness, difficulty swallowing , nasal congestion. CV: Negative for chest pain, angina, palpitations, dyspnea on exertion, peripheral edema.  Respiratory: Negative for dyspnea at rest, dyspnea on exertion, cough, sputum, wheezing.  GI: See history of present illness. GU:  Negative for dysuria, hematuria, urinary incontinence, urinary frequency, nocturnal urination.  Endo: Negative for unusual weight change.    Physical Examination:   BP 116/65   Pulse (!) 103   Temp 97.8 F (36.6 C) (Oral)   Ht 5' 7"  (1.702 m)   Wt 157 lb (71.2 kg)   BMI 24.59 kg/m   General: Well-nourished,  well-developed in no acute distress.  Eyes: No icterus. Mouth: Oropharyngeal mucosa moist and pink , no lesions erythema or exudate. Lungs: Clear to auscultation bilaterally.  Heart: Regular rate and rhythm, no murmurs rubs or gallops.  Abdomen: Bowel sounds are normal, nontender, nondistended, no hepatosplenomegaly or masses, no abdominal bruits or hernia , no rebound or guarding.   Extremities: No lower extremity edema. No clubbing or deformities. Neuro: Alert and oriented x 4   Skin: Warm and dry, no jaundice.   Psych: Alert and cooperative, normal mood and affect.  Labs:  Lab Results  Component Value Date   WBC 3.8 (L) 05/04/2016   HGB 15.9 05/04/2016   HCT 48.7  05/04/2016   MCV 83.7 05/04/2016   PLT 93 (L) 05/04/2016   Lab Results  Component Value Date   CREATININE 1.03 03/07/2015   BUN 18 03/07/2015   NA 141 03/07/2015   K 4.7 03/07/2015   CL 108 03/07/2015   CO2 26 03/07/2015   Lab Results  Component Value Date   ALT 51 03/07/2015   AST 29 03/07/2015   ALKPHOS 59 03/07/2015   BILITOT 0.8 03/07/2015    Imaging Studies: No results found.

## 2016-07-05 NOTE — Patient Instructions (Addendum)
1. Colonoscopy as scheduled. Day of bowel prep you'll take half dose Janumet and half dose Jardiance.  2. Prescription for Zofran since your pharmacy. Pick this up prior to your colonoscopy. If you develop nausea and vomiting related to anesthesia you will have this on hand to use at home. 3. Abdominal ultrasound as planned.

## 2016-07-05 NOTE — Assessment & Plan Note (Signed)
Family history of cirrhosis at an early age and a sister who has diabetes. Patient has had chronic splenomegaly and thrombocytopenia dating back for several years. Ultrasound with elastography in 2015 with no overt cirrhosis, his F score was 0. Given his family history and previously having portal gastropathy on endoscopy, I would advise updating ultrasound with elastography at this time. Patient in agreement with plan. If any signs of cirrhosis, would add upper endoscopy to be done at time of his colonoscopy to screen for esophageal varices.

## 2016-07-05 NOTE — Assessment & Plan Note (Signed)
Patient is due for surveillance colonoscopy in February. We'll go ahead and plan for colonoscopy in the upcoming future.  I have discussed the risks, alternatives, benefits with regards to but not limited to the risk of reaction to medication, bleeding, infection, perforation and the patient is agreeable to proceed. Written consent to be obtained.

## 2016-07-06 NOTE — Telephone Encounter (Signed)
Received fax from [place]. PA not needed for TCS and ultrasound.

## 2016-07-09 DIAGNOSIS — K7581 Nonalcoholic steatohepatitis (NASH): Secondary | ICD-10-CM

## 2016-07-09 HISTORY — DX: Nonalcoholic steatohepatitis (NASH): K75.81

## 2016-07-16 ENCOUNTER — Other Ambulatory Visit: Payer: Self-pay

## 2016-07-16 ENCOUNTER — Ambulatory Visit (HOSPITAL_COMMUNITY)
Admission: RE | Admit: 2016-07-16 | Discharge: 2016-07-16 | Disposition: A | Discharge status: Home / Self Care | Payer: PPO | Source: Ambulatory Visit | Attending: Gastroenterology | Admitting: Gastroenterology

## 2016-07-16 DIAGNOSIS — Z86010 Personal history of colonic polyps: Secondary | ICD-10-CM

## 2016-07-16 DIAGNOSIS — D696 Thrombocytopenia, unspecified: Secondary | ICD-10-CM

## 2016-07-16 DIAGNOSIS — R161 Splenomegaly, not elsewhere classified: Secondary | ICD-10-CM

## 2016-07-16 DIAGNOSIS — K7581 Nonalcoholic steatohepatitis (NASH): Secondary | ICD-10-CM | POA: Diagnosis not present

## 2016-07-16 DIAGNOSIS — N200 Calculus of kidney: Secondary | ICD-10-CM | POA: Insufficient documentation

## 2016-07-16 DIAGNOSIS — N281 Cyst of kidney, acquired: Secondary | ICD-10-CM | POA: Insufficient documentation

## 2016-07-16 DIAGNOSIS — K760 Fatty (change of) liver, not elsewhere classified: Secondary | ICD-10-CM | POA: Diagnosis not present

## 2016-07-16 NOTE — Progress Notes (Signed)
Please let patient know that his F score has progressed in past two years from F0 (lowest degree of fibrosis) to F2/F3 (moderate fibrosis). Given he had some portal gastropathy on EGD 2014, his chronically low platelet count, I would recommend repeat EGD with ?variceal banding to screen for esophageal varices (with banding if appropriate) AT TIME OF UPCOMING TCS.   Please add on EGD with ?banding.

## 2016-07-26 ENCOUNTER — Encounter (HOSPITAL_COMMUNITY): Payer: Self-pay | Admitting: *Deleted

## 2016-07-26 ENCOUNTER — Ambulatory Visit (HOSPITAL_COMMUNITY)
Admission: RE | Admit: 2016-07-26 | Discharge: 2016-07-26 | Disposition: A | Discharge status: Home Health | Payer: PPO | Source: Ambulatory Visit | Attending: Internal Medicine | Admitting: Internal Medicine

## 2016-07-26 ENCOUNTER — Encounter (HOSPITAL_COMMUNITY)
Admission: RE | Disposition: A | Discharge status: Home Health | Payer: Self-pay | Source: Ambulatory Visit | Attending: Internal Medicine

## 2016-07-26 DIAGNOSIS — D123 Benign neoplasm of transverse colon: Secondary | ICD-10-CM | POA: Diagnosis not present

## 2016-07-26 DIAGNOSIS — K5730 Diverticulosis of large intestine without perforation or abscess without bleeding: Secondary | ICD-10-CM | POA: Insufficient documentation

## 2016-07-26 DIAGNOSIS — K2950 Unspecified chronic gastritis without bleeding: Secondary | ICD-10-CM | POA: Insufficient documentation

## 2016-07-26 DIAGNOSIS — K3189 Other diseases of stomach and duodenum: Secondary | ICD-10-CM | POA: Insufficient documentation

## 2016-07-26 DIAGNOSIS — Z13810 Encounter for screening for upper gastrointestinal disorder: Secondary | ICD-10-CM | POA: Diagnosis not present

## 2016-07-26 DIAGNOSIS — K635 Polyp of colon: Secondary | ICD-10-CM | POA: Diagnosis not present

## 2016-07-26 DIAGNOSIS — K766 Portal hypertension: Secondary | ICD-10-CM | POA: Diagnosis not present

## 2016-07-26 DIAGNOSIS — Z1211 Encounter for screening for malignant neoplasm of colon: Secondary | ICD-10-CM | POA: Diagnosis not present

## 2016-07-26 DIAGNOSIS — Z86010 Personal history of colonic polyps: Secondary | ICD-10-CM | POA: Insufficient documentation

## 2016-07-26 DIAGNOSIS — D696 Thrombocytopenia, unspecified: Secondary | ICD-10-CM

## 2016-07-26 HISTORY — PX: ESOPHAGOGASTRODUODENOSCOPY: SHX5428

## 2016-07-26 HISTORY — PX: BIOPSY: SHX5522

## 2016-07-26 HISTORY — PX: COLONOSCOPY: SHX5424

## 2016-07-26 HISTORY — PX: POLYPECTOMY: SHX5525

## 2016-07-26 LAB — GLUCOSE, CAPILLARY: GLUCOSE-CAPILLARY: 107 mg/dL — AB (ref 65–99)

## 2016-07-26 SURGERY — COLONOSCOPY
Anesthesia: Moderate Sedation

## 2016-07-26 MED ORDER — SODIUM CHLORIDE 0.9% FLUSH
INTRAVENOUS | Status: AC
  Filled 2016-07-26: qty 10

## 2016-07-26 MED ORDER — MIDAZOLAM HCL 5 MG/5ML IJ SOLN
INTRAMUSCULAR | Status: AC
  Filled 2016-07-26: qty 10

## 2016-07-26 MED ORDER — LIDOCAINE VISCOUS 2 % MT SOLN
OROMUCOSAL | Status: DC | PRN
  Administered 2016-07-26: 3 mL via OROMUCOSAL

## 2016-07-26 MED ORDER — STERILE WATER FOR IRRIGATION IR SOLN
Status: DC | PRN
  Administered 2016-07-26: 13:00:00

## 2016-07-26 MED ORDER — ONDANSETRON HCL 4 MG/2ML IJ SOLN
INTRAMUSCULAR | Status: AC
  Filled 2016-07-26: qty 2

## 2016-07-26 MED ORDER — SODIUM CHLORIDE 0.9 % IV SOLN
INTRAVENOUS | Status: DC
  Administered 2016-07-26: 12:00:00 via INTRAVENOUS

## 2016-07-26 MED ORDER — MIDAZOLAM HCL 5 MG/5ML IJ SOLN
INTRAMUSCULAR | Status: DC | PRN
  Administered 2016-07-26: 2 mg via INTRAVENOUS
  Administered 2016-07-26: 1 mg via INTRAVENOUS

## 2016-07-26 MED ORDER — PROMETHAZINE HCL 25 MG/ML IJ SOLN
INTRAMUSCULAR | Status: AC
  Filled 2016-07-26: qty 1

## 2016-07-26 MED ORDER — LIDOCAINE VISCOUS 2 % MT SOLN
OROMUCOSAL | Status: AC
  Filled 2016-07-26: qty 15

## 2016-07-26 MED ORDER — MEPERIDINE HCL 100 MG/ML IJ SOLN
INTRAMUSCULAR | Status: DC | PRN
  Administered 2016-07-26: 50 mg via INTRAVENOUS

## 2016-07-26 MED ORDER — PROMETHAZINE HCL 25 MG/ML IJ SOLN
12.5000 mg | Freq: Four times a day (QID) | INTRAMUSCULAR | Status: DC | PRN
  Administered 2016-07-26: 12.5 mg via INTRAVENOUS

## 2016-07-26 MED ORDER — MEPERIDINE HCL 100 MG/ML IJ SOLN
INTRAMUSCULAR | Status: AC
  Filled 2016-07-26: qty 2

## 2016-07-26 MED ORDER — ONDANSETRON HCL 4 MG/2ML IJ SOLN
INTRAMUSCULAR | Status: DC | PRN
  Administered 2016-07-26: 4 mg via INTRAVENOUS

## 2016-07-26 NOTE — Op Note (Signed)
[facility] Patient Name: [name] Procedure Date: 07/26/2016 1:12 PM MRN: [id number] Date of Birth: [birth year] Attending MD: Norvel Richards , MD CSN: [id number] Age: 61 Admit Type: Outpatient Procedure:                Upper GI endoscopy with gastric biopsy Indications:              Screening procedure, Portal hypertension rule out                            esophageal varices Providers:                Norvel Richards, MD, Otis Peak B. Sharon Seller, RN,                            Purcell Nails. Algood, Merchant navy officer Referring MD:              Medicines:                Midazolam 2 mg IV, Meperidine 50 mg IV, Ondansetron                            4 mg IV Complications:            No immediate complications. Estimated Blood Loss:     Estimated blood loss was minimal. Procedure:                Pre-Anesthesia Assessment:                           - Prior to the procedure, a History and Physical                            was performed, and patient medications and                            allergies were reviewed. The patient's tolerance of                            previous anesthesia was also reviewed. The risks                            and benefits of the procedure and the sedation                            options and risks were discussed with the patient.                            All questions were answered, and informed consent                            was obtained. Prior Anticoagulants: The patient has                            taken no previous anticoagulant or antiplatelet  agents. ASA Grade Assessment: III - A patient with                            severe systemic disease. After reviewing the risks                            and benefits, the patient was deemed in                            satisfactory condition to undergo the procedure.                           After obtaining informed consent, the endoscope was     passed under direct vision. Throughout the                            procedure, the patient's blood pressure, pulse, and                            oxygen saturations were monitored continuously. The                            [id number] ([id number]) scope was introduced through the                            mouth, and advanced to the second part of duodenum.                            The upper GI endoscopy was accomplished without                            difficulty. The patient tolerated the procedure                            well. Scope In: 1:17:29 PM Scope Out: 1:22:33 PM Total Procedure Duration: 0 hours 5 minutes 4 seconds  Findings:      The examined esophagus was normal. No varices.      Moderate portal hypertensive gastropathy was found in the stomach.      Erythematous mucosa without bleeding was found in the gastric antrum.       This was biopsied with a cold forceps for histology. Estimated blood       loss was minimal      The duodenal bulb and second portion of the duodenum were normal. . Impression:               - Normal esophagus.                           - Portal hypertensive gastropathy.                           - Erythematous mucosa in the antrum. Biopsied.                           - Normal duodenal bulb and  second portion of the                            duodenum. Moderate Sedation:      Moderate (conscious) sedation was administered by the endoscopy nurse       and supervised by the endoscopist. The following parameters were       monitored: oxygen saturation, heart rate, blood pressure, respiratory       rate, EKG, adequacy of pulmonary ventilation, and response to care.       Total physician intraservice time was 10 minutes. Recommendation:           - Patient has a contact number available for                            emergencies. The signs and symptoms of potential                            delayed complications were discussed with the                             patient. Return to normal activities tomorrow.                            Written discharge instructions were provided to the                            patient.                           - Resume previous diet.                           - Repeat upper endoscopy in 2 years for screening                            purposes. See colonoscopy report.                           - Return to GI clinic in 3 months.                           - Continue present medications. Procedure Code(s):        --- Professional ---                           [phone], Esophagogastroduodenoscopy, flexible,                            transoral; with biopsy, single or multiple                           99152, Moderate sedation services provided by the                            same physician or other qualified health care  professional performing the diagnostic or                            therapeutic service that the sedation supports,                            requiring the presence of an independent trained                            observer to assist in the monitoring of the                            patient's level of consciousness and physiological                            status; initial 15 minutes of intraservice time,                            patient age 61 years or older Diagnosis Code(s):        --- Professional ---                           K76.6, Portal hypertension                           K31.89, Other diseases of stomach and duodenum                           Z13.810, Encounter for screening for upper                            gastrointestinal disorder CPT copyright 2016 [employer]. All rights reserved. The codes documented in this report are preliminary and upon coder review may  be revised to meet current compliance requirements. Cristopher Estimable. Kendale Rembold, MD Norvel Richards, MD 07/26/2016 1:47:45 PM This report has been signed  electronically. Number of Addenda: 0

## 2016-07-26 NOTE — Discharge Instructions (Addendum)
Colonoscopy Discharge Instructions  Read the instructions outlined below and refer to this sheet in the next few weeks. These discharge instructions provide you with general information on caring for yourself after you leave the hospital. Your doctor may also give you specific instructions. While your treatment has been planned according to the most current medical practices available, unavoidable complications occasionally occur. If you have any problems or questions after discharge, call Dr. [name] at [phone]. ACTIVITY  You may resume your regular activity, but move at a slower pace for the next 24 hours.   Take frequent rest periods for the next 24 hours.   Walking will help get rid of the air and reduce the bloated feeling in your belly (abdomen).   No driving for 24 hours (because of the medicine (anesthesia) used during the test).    Do not sign any important legal documents or operate any machinery for 24 hours (because of the anesthesia used during the test).  NUTRITION  Drink plenty of fluids.   You may resume your normal diet as instructed by your doctor.   Begin with a light meal and progress to your normal diet. Heavy or fried foods are harder to digest and may make you feel sick to your stomach (nauseated).   Avoid alcoholic beverages for 24 hours or as instructed.  MEDICATIONS  You may resume your normal medications unless your doctor tells you otherwise.  WHAT YOU CAN EXPECT TODAY  Some feelings of bloating in the abdomen.   Passage of more gas than usual.   Spotting of blood in your stool or on the toilet paper.  IF YOU HAD POLYPS REMOVED DURING THE COLONOSCOPY:  No aspirin products for 7 days or as instructed.   No alcohol for 7 days or as instructed.   Eat a soft diet for the next 24 hours.  FINDING OUT THE RESULTS OF YOUR TEST Not all test results are available during your visit. If your test results are not back during the visit, make an appointment  with your caregiver to find out the results. Do not assume everything is normal if you have not heard from your caregiver or the medical facility. It is important for you to follow up on all of your test results.  SEEK IMMEDIATE MEDICAL ATTENTION IF:  You have more than a spotting of blood in your stool.   Your belly is swollen (abdominal distention).   You are nauseated or vomiting.   You have a temperature over 101.   You have abdominal pain or discomfort that is severe or gets worse throughout the day.  EGD Discharge instructions Please read the instructions outlined below and refer to this sheet in the next few weeks. These discharge instructions provide you with general information on caring for yourself after you leave the hospital. Your doctor may also give you specific instructions. While your treatment has been planned according to the most current medical practices available, unavoidable complications occasionally occur. If you have any problems or questions after discharge, please call your doctor. ACTIVITY  You may resume your regular activity but move at a slower pace for the next 24 hours.   Take frequent rest periods for the next 24 hours.   Walking will help expel (get rid of) the air and reduce the bloated feeling in your abdomen.   No driving for 24 hours (because of the anesthesia (medicine) used during the test).   You may shower.   Do not sign any important  legal documents or operate any machinery for 24 hours (because of the anesthesia used during the test).  NUTRITION  Drink plenty of fluids.   You may resume your normal diet.   Begin with a light meal and progress to your normal diet.   Avoid alcoholic beverages for 24 hours or as instructed by your caregiver.  MEDICATIONS  You may resume your normal medications unless your caregiver tells you otherwise.  WHAT YOU CAN EXPECT TODAY  You may experience abdominal discomfort such as a feeling of fullness  or gas pains.  FOLLOW-UP  Your doctor will discuss the results of your test with you.  SEEK IMMEDIATE MEDICAL ATTENTION IF ANY OF THE FOLLOWING OCCUR:  Excessive nausea (feeling sick to your stomach) and/or vomiting.   Severe abdominal pain and distention (swelling).   Trouble swallowing.   Temperature over 101 F (37.8 C).   Rectal bleeding or vomiting of blood.    Colon polyp and diverticulosis information provided.  Repeat EGD in 2 years  Further recommendations to follow pending review of pathology report  Office visit with [place] in 3 months   Colon Polyps Introduction Polyps are tissue growths inside the body. Polyps can grow in many places, including the large intestine (colon). A polyp may be a round bump or a mushroom-shaped growth. You could have one polyp or several. Most colon polyps are noncancerous (benign). However, some colon polyps can become cancerous over time. What are the causes? The exact cause of colon polyps is not known. What increases the risk? This condition is more likely to develop in people who:  Have a family history of colon cancer or colon polyps.  Are older than 10 or older than 45 if they are African American.  Have inflammatory bowel disease, such as ulcerative colitis or Crohn disease.  Are overweight.  Smoke cigarettes.  Do not get enough exercise.  Drink too much alcohol.  Eat a diet that is:  High in fat and red meat.  Low in fiber.  Had childhood cancer that was treated with abdominal radiation. What are the signs or symptoms? Most polyps do not cause symptoms. If you have symptoms, they may include:  Blood coming from your rectum when having a bowel movement.  Blood in your stool.The stool may look dark red or black.  A change in bowel habits, such as constipation or diarrhea. How is this diagnosed? This condition is diagnosed with a colonoscopy. This is a procedure that uses a lighted, flexible scope to look  at the inside of your colon. How is this treated? Treatment for this condition involves removing any polyps that are found. Those polyps will then be tested for cancer. If cancer is found, your health care provider will talk to you about options for colon cancer treatment. Follow these instructions at home: Diet  Eat plenty of fiber, such as fruits, vegetables, and whole grains.  Eat foods that are high in calcium and vitamin D, such as milk, cheese, yogurt, eggs, liver, fish, and broccoli.  Limit foods high in fat, red meats, and processed meats, such as hot dogs, sausage, bacon, and lunch meats.  Maintain a healthy weight, or lose weight if recommended by your health care provider. General instructions  Do not smoke cigarettes.  Do not drink alcohol excessively.  Keep all follow-up visits as told by your health care provider. This is important. This includes keeping regularly scheduled colonoscopies. Talk to your health care provider about when you need a colonoscopy.  Exercise every day or as told by your health care provider. Contact a health care provider if:  You have new or worsening bleeding during a bowel movement.  You have new or increased blood in your stool.  You have a change in bowel habits.  You unexpectedly lose weight.   Diverticulosis Diverticulosis is the condition that develops when small pouches (diverticula) form in the wall of your colon. Your colon, or large intestine, is where water is absorbed and stool is formed. The pouches form when the inside layer of your colon pushes through weak spots in the outer layers of your colon. CAUSES  No one knows exactly what causes diverticulosis. RISK FACTORS  Being older than 42. Your risk for this condition increases with age. Diverticulosis is rare in people younger than 40 years. By age 37, almost everyone has it.  Eating a low-fiber diet.  Being frequently constipated.  Being overweight.  Not getting  enough exercise.  Smoking.  Taking over-the-counter pain medicines, like aspirin and ibuprofen. SYMPTOMS  Most people with diverticulosis do not have symptoms. DIAGNOSIS  Because diverticulosis often has no symptoms, health care providers often discover the condition during an exam for other colon problems. In many cases, a health care provider will diagnose diverticulosis while using a flexible scope to examine the colon (colonoscopy). TREATMENT  If you have never developed an infection related to diverticulosis, you may not need treatment. If you have had an infection before, treatment may include:  Eating more fruits, vegetables, and grains.  Taking a fiber supplement.  Taking a live bacteria supplement (probiotic).  Taking medicine to relax your colon. HOME CARE INSTRUCTIONS   Drink at least 6-8 glasses of water each day to prevent constipation.  Try not to strain when you have a bowel movement.  Keep all follow-up appointments. If you have had an infection before:  Increase the fiber in your diet as directed by your health care provider or dietitian.  Take a dietary fiber supplement if your health care provider approves.  Only take medicines as directed by your health care provider. SEEK MEDICAL CARE IF:   You have abdominal pain.  You have bloating.  You have cramps.  You have not gone to the bathroom in 3 days. SEEK IMMEDIATE MEDICAL CARE IF:   Your pain gets worse.  Yourbloating becomes very bad.  You have a fever or chills, and your symptoms suddenly get worse.  You begin vomiting.  You have bowel movements that are bloody or black. MAKE SURE YOU:  Understand these instructions.  Will watch your condition.  Will get help right away if you are not doing well or get worse.

## 2016-07-26 NOTE — H&P (View-Only) (Signed)
Please let patient know that his F score has progressed in past two years from F0 (lowest degree of fibrosis) to F2/F3 (moderate fibrosis). Given he had some portal gastropathy on EGD 2014, his chronically low platelet count, I would recommend repeat EGD with ?variceal banding to screen for esophageal varices (with banding if appropriate) AT TIME OF UPCOMING TCS.   Please add on EGD with ?banding.

## 2016-07-26 NOTE — Op Note (Signed)
[facility] Patient Name: [name] Procedure Date: 07/26/2016 1:25 PM MRN: [id number] Date of Birth: 1956/05/25 Attending MD: Norvel Richards , MD CSN: [id number] Age: 61 Admit Type: Outpatient Procedure:                Colonoscopy with snare polypectomy Indications:              High risk colon cancer surveillance: Personal                            history of colonic polyps Providers:                Norvel Richards, MD, Gwenlyn Fudge RN, RN,                            [facility], Technician Referring MD:              Medicines:                Midazolam 3 mg IV, Meperidine 50 mg IV Complications:            No immediate complications. Estimated Blood Loss:     Estimated blood loss: none. Procedure:                Pre-Anesthesia Assessment:                           - Prior to the procedure, a History and Physical                            was performed, and patient medications and                            allergies were reviewed. The patient's tolerance of                            previous anesthesia was also reviewed. The risks                            and benefits of the procedure and the sedation                            options and risks were discussed with the patient.                            All questions were answered, and informed consent                            was obtained. Prior Anticoagulants: The patient has                            taken no previous anticoagulant or antiplatelet                            agents. ASA Grade Assessment: III - A patient with  severe systemic disease. After reviewing the risks                            and benefits, the patient was deemed in                            satisfactory condition to undergo the procedure.                           After obtaining informed consent, the colonoscope                            was passed under direct vision. Throughout the                procedure, the patient's blood pressure, pulse, and                            oxygen saturations were monitored continuously. The                            [id number] scope was introduced through                            the anus and advanced to the the cecum, identified                            by appendiceal orifice and ileocecal valve. The                            quality of the bowel preparation was adequate. The                            ileocecal valve, appendiceal orifice, and rectum                            were photographed. Scope In: 1:29:41 PM Scope Out: 1:40:03 PM Scope Withdrawal Time: 0 hours 7 minutes 45 seconds  Total Procedure Duration: 0 hours 10 minutes 22 seconds  Findings:      The perianal and digital rectal examinations were normal.      Scattered small and large-mouthed diverticula were found in the sigmoid       colon and descending colon.      A 6 mm polyp was found in the splenic flexure. The polyp was       semi-pedunculated. The polyp was removed with a hot snare. Resection and       retrieval were complete. Estimated blood loss: none.      The exam was otherwise without abnormality on direct and retroflexion       views except for multiple 1-2 mm hyperplastic appearing mamillation's in       the sigmoid and rectal segments which were not manipulated.. Impression:               - Diverticulosis in the sigmoid colon and in the  descending colon.                           - One 6 mm polyp at the splenic flexure, removed                            with a hot snare. Resected and retrieved.                           - The examination was otherwise normal on direct                            and retroflexion views. Moderate Sedation:      Moderate (conscious) sedation was administered by the endoscopy nurse       and supervised by the endoscopist. The following parameters were       monitored: oxygen  saturation, heart rate, blood pressure, respiratory       rate, EKG, adequacy of pulmonary ventilation, and response to care.       Total physician intraservice time was 28 minutes. Recommendation:           - Patient has a contact number available for                            emergencies. The signs and symptoms of potential                            delayed complications were discussed with the                            patient. Return to normal activities tomorrow.                            Written discharge instructions were provided to the                            patient.                           - Advance diet as tolerated.                           - Return to GI office in 3 months.                           - Return to GI office in 3 months.                           - Repeat colonoscopy date to be determined after                            pending pathology results are reviewed for                            surveillance.                           -  Continue present medications. Procedure Code(s):        --- Professional ---                           [phone], Colonoscopy, flexible; with removal of                            tumor(s), polyp(s), or other lesion(s) by snare                            technique                           99152, Moderate sedation services provided by the                            same physician or other qualified health care                            professional performing the diagnostic or                            therapeutic service that the sedation supports,                            requiring the presence of an independent trained                            observer to assist in the monitoring of the                            patient's level of consciousness and physiological                            status; initial 15 minutes of intraservice time,                            patient age 82 years or older                            [phone], Moderate sedation services; each additional                            15 minutes intraservice time Diagnosis Code(s):        --- Professional ---                           Z86.010, Personal history of colonic polyps                           D12.3, Benign neoplasm of transverse colon (hepatic                            flexure or splenic flexure)  K57.30, Diverticulosis of large intestine without                            perforation or abscess without bleeding CPT copyright 2016 American Medical Association. All rights reserved. The codes documented in this report are preliminary and upon coder review may  be revised to meet current compliance requirements. Cristopher Estimable. Alton Tremblay, MD Norvel Richards, MD 07/26/2016 2:03:35 PM This report has been signed electronically. Number of Addenda: 0

## 2016-07-26 NOTE — Interval H&P Note (Signed)
History and Physical Interval Note:  07/26/2016 1:09 PM  [name]  has presented today for surgery, with the diagnosis of history of adenomatous polyps, chronic low platelet count, -screen for esophageal varices-  The various methods of treatment have been discussed with the patient and family. After consideration of risks, benefits and other options for treatment, the patient has consented to  Procedure(s) with comments: COLONOSCOPY (N/A) - 2:30 PM ESOPHAGOGASTRODUODENOSCOPY (EGD) (N/A) ESOPHAGEAL VARICES BANDING (N/A) as a surgical intervention .  The patient's history has been reviewed, patient examined, no change in status, stable for surgery.  I have reviewed the patient's chart and labs.  Questions were answered to the patient's satisfaction.     [name]  No change. EGD and colonoscopy per plan.  The risks, benefits, limitations, imponderables and alternatives regarding both EGD and colonoscopy have been reviewed with the patient. Questions have been answered. All parties agreeable.

## 2016-07-31 ENCOUNTER — Encounter (HOSPITAL_COMMUNITY): Payer: Self-pay | Admitting: Internal Medicine

## 2016-08-04 ENCOUNTER — Encounter: Payer: Self-pay | Admitting: Internal Medicine

## 2016-08-06 ENCOUNTER — Telehealth: Payer: Self-pay

## 2016-08-06 ENCOUNTER — Encounter: Payer: Self-pay | Admitting: Internal Medicine

## 2016-08-06 NOTE — Telephone Encounter (Signed)
Per RMR-  Send letter to patient.  Send copy of letter with path to referring provider and PCP.   Need an ov w [facility] in 6 mos if not already scheduled.

## 2016-08-06 NOTE — Telephone Encounter (Signed)
APPT MADE AND LETTER SENT  °

## 2016-08-06 NOTE — Telephone Encounter (Signed)
Letter mailed to the pt. 

## 2016-09-20 DIAGNOSIS — D696 Thrombocytopenia, unspecified: Secondary | ICD-10-CM | POA: Diagnosis not present

## 2016-09-20 DIAGNOSIS — E119 Type 2 diabetes mellitus without complications: Secondary | ICD-10-CM | POA: Diagnosis not present

## 2016-09-20 DIAGNOSIS — I10 Essential (primary) hypertension: Secondary | ICD-10-CM | POA: Diagnosis not present

## 2016-09-20 DIAGNOSIS — J449 Chronic obstructive pulmonary disease, unspecified: Secondary | ICD-10-CM | POA: Diagnosis not present

## 2016-09-24 DIAGNOSIS — E119 Type 2 diabetes mellitus without complications: Secondary | ICD-10-CM | POA: Diagnosis not present

## 2016-09-24 DIAGNOSIS — J441 Chronic obstructive pulmonary disease with (acute) exacerbation: Secondary | ICD-10-CM | POA: Diagnosis not present

## 2016-09-24 DIAGNOSIS — I10 Essential (primary) hypertension: Secondary | ICD-10-CM | POA: Diagnosis not present

## 2016-09-24 DIAGNOSIS — N401 Enlarged prostate with lower urinary tract symptoms: Secondary | ICD-10-CM | POA: Diagnosis not present

## 2016-11-08 ENCOUNTER — Other Ambulatory Visit: Payer: Self-pay | Admitting: Gastroenterology

## 2016-11-09 ENCOUNTER — Ambulatory Visit (HOSPITAL_COMMUNITY): Payer: PPO | Admitting: Oncology

## 2016-11-09 ENCOUNTER — Other Ambulatory Visit (HOSPITAL_COMMUNITY): Payer: PPO

## 2016-11-14 ENCOUNTER — Other Ambulatory Visit (HOSPITAL_COMMUNITY): Payer: Self-pay | Admitting: *Deleted

## 2016-11-14 DIAGNOSIS — D696 Thrombocytopenia, unspecified: Secondary | ICD-10-CM

## 2016-11-15 ENCOUNTER — Encounter (HOSPITAL_BASED_OUTPATIENT_CLINIC_OR_DEPARTMENT_OTHER): Payer: PPO | Admitting: Oncology

## 2016-11-15 ENCOUNTER — Encounter (HOSPITAL_COMMUNITY): Payer: PPO | Attending: Oncology

## 2016-11-15 ENCOUNTER — Encounter (HOSPITAL_COMMUNITY): Payer: Self-pay | Admitting: Oncology

## 2016-11-15 VITALS — BP 107/63 | HR 88 | Temp 98.2°F | Resp 18 | Ht 67.0 in | Wt 164.0 lb

## 2016-11-15 DIAGNOSIS — J449 Chronic obstructive pulmonary disease, unspecified: Secondary | ICD-10-CM | POA: Diagnosis not present

## 2016-11-15 DIAGNOSIS — D696 Thrombocytopenia, unspecified: Secondary | ICD-10-CM | POA: Insufficient documentation

## 2016-11-15 DIAGNOSIS — D72819 Decreased white blood cell count, unspecified: Secondary | ICD-10-CM | POA: Insufficient documentation

## 2016-11-15 DIAGNOSIS — E785 Hyperlipidemia, unspecified: Secondary | ICD-10-CM | POA: Insufficient documentation

## 2016-11-15 DIAGNOSIS — R945 Abnormal results of liver function studies: Secondary | ICD-10-CM

## 2016-11-15 DIAGNOSIS — R161 Splenomegaly, not elsewhere classified: Secondary | ICD-10-CM | POA: Diagnosis not present

## 2016-11-15 DIAGNOSIS — R7989 Other specified abnormal findings of blood chemistry: Secondary | ICD-10-CM

## 2016-11-15 DIAGNOSIS — F329 Major depressive disorder, single episode, unspecified: Secondary | ICD-10-CM | POA: Diagnosis not present

## 2016-11-15 DIAGNOSIS — F419 Anxiety disorder, unspecified: Secondary | ICD-10-CM | POA: Diagnosis not present

## 2016-11-15 DIAGNOSIS — E119 Type 2 diabetes mellitus without complications: Secondary | ICD-10-CM | POA: Insufficient documentation

## 2016-11-15 DIAGNOSIS — K219 Gastro-esophageal reflux disease without esophagitis: Secondary | ICD-10-CM | POA: Insufficient documentation

## 2016-11-15 DIAGNOSIS — I10 Essential (primary) hypertension: Secondary | ICD-10-CM | POA: Insufficient documentation

## 2016-11-15 DIAGNOSIS — Z87891 Personal history of nicotine dependence: Secondary | ICD-10-CM | POA: Diagnosis not present

## 2016-11-15 DIAGNOSIS — K760 Fatty (change of) liver, not elsewhere classified: Secondary | ICD-10-CM | POA: Diagnosis not present

## 2016-11-15 DIAGNOSIS — K560 Paralytic ileus: Secondary | ICD-10-CM | POA: Insufficient documentation

## 2016-11-15 LAB — COMPREHENSIVE METABOLIC PANEL
ALT: 50 U/L (ref 17–63)
ANION GAP: 9 (ref 5–15)
AST: 33 U/L (ref 15–41)
Albumin: 4.2 g/dL (ref 3.5–5.0)
Alkaline Phosphatase: 60 U/L (ref 38–126)
BUN: 20 mg/dL (ref 6–20)
CHLORIDE: 103 mmol/L (ref 101–111)
CO2: 26 mmol/L (ref 22–32)
CREATININE: 1.11 mg/dL (ref 0.61–1.24)
Calcium: 9.3 mg/dL (ref 8.9–10.3)
Glucose, Bld: 253 mg/dL — ABNORMAL HIGH (ref 65–99)
POTASSIUM: 4.4 mmol/L (ref 3.5–5.1)
SODIUM: 138 mmol/L (ref 135–145)
Total Bilirubin: 0.7 mg/dL (ref 0.3–1.2)
Total Protein: 7.8 g/dL (ref 6.5–8.1)

## 2016-11-15 LAB — CBC WITH DIFFERENTIAL/PLATELET
Basophils Absolute: 0.1 10*3/uL (ref 0.0–0.1)
Basophils Relative: 1 %
EOS ABS: 0.3 10*3/uL (ref 0.0–0.7)
EOS PCT: 6 %
HCT: 48 % (ref 39.0–52.0)
Hemoglobin: 15.8 g/dL (ref 13.0–17.0)
LYMPHS ABS: 1.3 10*3/uL (ref 0.7–4.0)
Lymphocytes Relative: 29 %
MCH: 27.3 pg (ref 26.0–34.0)
MCHC: 32.9 g/dL (ref 30.0–36.0)
MCV: 82.9 fL (ref 78.0–100.0)
MONO ABS: 0.3 10*3/uL (ref 0.1–1.0)
MONOS PCT: 7 %
NEUTROS PCT: 57 %
Neutro Abs: 2.6 10*3/uL (ref 1.7–7.7)
PLATELETS: 88 10*3/uL — AB (ref 150–400)
RBC: 5.79 MIL/uL (ref 4.22–5.81)
RDW: 14.8 % (ref 11.5–15.5)
WBC: 4.6 10*3/uL (ref 4.0–10.5)

## 2016-11-15 NOTE — Patient Instructions (Addendum)
[place] at [facility] Discharge Instructions  RECOMMENDATIONS MADE BY THE CONSULTANT AND ANY TEST RESULTS WILL BE SENT TO YOUR REFERRING PHYSICIAN.  You were seen today by Kirby Crigler PA-C. Return in 9 months for labs and follow up.    Thank you for choosing [place] at [facility] to provide your oncology and hematology care.  To afford each patient quality time with our provider, please arrive at least 15 minutes before your scheduled appointment time.    If you have a lab appointment with the [facility] please come in thru the  Main Entrance and check in at the main information desk  You need to re-schedule your appointment should you arrive 10 or more minutes late.  We strive to give you quality time with our providers, and arriving late affects you and other patients whose appointments are after yours.  Also, if you no show three or more times for appointments you may be dismissed from the clinic at the providers discretion.     Again, thank you for choosing [facility].  Our hope is that these requests will decrease the amount of time that you wait before being seen by our physicians.       _____________________________________________________________  Should you have questions after your visit to [facility], please contact our office at [phone] between the hours of 8:30 a.m. and 4:30 p.m.  Voicemails left after 4:30 p.m. will not be returned until the following business day.  For prescription refill requests, have your pharmacy contact our office.       Resources For Cancer Patients and their Caregivers ? American Cancer Society: Can assist with transportation, wigs, general needs, runs Look Good Feel Better.        (684)471-0917 ? Cancer Care: Provides financial assistance, online support groups, medication/co-pay assistance.  1-800-813-HOPE (801) 196-2132) ? [place] Assists [place] cancer patients and their families through emotional , educational and financial support.  (817)396-6791 ? [place] DSS Where to apply for food stamps, Medicaid and utility assistance. (401)799-9937 ? [facility]: Transportation to medical appointments. 780 881 0132 ? Social Security Administration: May apply for disability if have a Stage IV cancer. 765 116 1115 321-409-4806 ? [employer], Disability and Transit Services: Assists with nutrition, care and transit needs. [place] Support Programs: @10RELATIVEDAYS @ > Cancer Support Group  2nd Tuesday of the month 1pm-2pm, Journey Room  > Creative Journey  3rd Tuesday of the month 1130am-1pm, Journey Room  > Look Good Feel Better  1st Wednesday of the month 10am-12 noon, Journey Room (Call [place] to register [phone])

## 2016-11-15 NOTE — Assessment & Plan Note (Addendum)
Mild thrombocytopenia and leukopenia in the setting of splenomegaly with fatty infiltration of liver; DM and intestinal paresis.  Labs today: CBC diff, CMET. I personally reviewed and went over laboratory results with the patient.  The results are noted within this dictation.    WBC is normal HGB is normal Thrombocytopenia is noted and stable at 88,000.  Labs in 9 months: CBC diff, CMET.  He is S/P EGD/Colonoscopy by Dr. [name] in Jan 2018.  This demonstrated diverticulosis, a single colon polyp that was resected, and portal gastropathy.   Patient reports that it is recommended that he have a repeat colonoscopy in 5 years (2023) and EGD in 2 years (2020).  I personally reviewed and went over pathology results with the patient.  Pathology was negative for any dysplasia and malignancy.  Return in 9 months for follow-up.  He knows that we can perform blood work and see him sooner than planned if needed.  He will call sooner if needed if he develops abnormal bruising or bleeding.

## 2016-11-15 NOTE — Progress Notes (Signed)
Sinda Du, MD [address] [place] [address]  Thrombocytopenia [facility]) - Plan: CBC with Differential, Comprehensive metabolic panel  Leukopenia, unspecified type - Plan: CBC with Differential  Abnormal LFTs - Plan: Comprehensive metabolic panel  CURRENT THERAPY: Observation  INTERVAL HISTORY: [name] 61 y.o. male returns for followup of mild thrombocytopenia and leukopenia in the setting of splenomegaly with fatty infiltration of liver. AND DM, well controlled AND Intestinal paresis   HPI Elements Thrombocytopenia  Location: Blood  Quality: Low platelet count  Severity: Mild  Duration: Since at least 2010  Context: Splenomegaly and fatty infiltration of liver  Timing:   Modifying Factors:   Associated Signs & Symptoms:     HPI Elements Leukopenia  Location: Blood  Quality: Normal differential  Severity: Minima  Duration: Since at least 2014  Context: Fatty infiltration of liver  Timing:   Modifying Factors:   Associated Signs & Symptoms:    Patient is doing well.  He does report easy bruising but this is at baseline.  He denies any abnormal bleeding.  He does admit to fatigue, leg swelling which is not present on exam today, and numbness of legs secondary to diabetes.  His last hemoglobin A1c on 09/20/2016 was 5.9 indicative of well-controlled diabetes.  He rates his appetite is 75%.  He rates his energy level 50%.  He denies any pain today.  Review of Systems  Constitutional: Positive for malaise/fatigue. Negative for chills, fever and weight loss.  HENT: Negative.   Eyes: Negative.   Respiratory: Negative.  Negative for cough.   Cardiovascular: Negative.  Negative for chest pain.  Gastrointestinal: Negative.  Negative for blood in stool, constipation, diarrhea, melena, nausea and vomiting.  Genitourinary: Negative.   Musculoskeletal: Negative.   Skin: Negative.   Neurological: Positive for sensory change. Negative  for weakness.  Endo/Heme/Allergies: Bruises/bleeds easily.  Psychiatric/Behavioral: Negative.     Past Medical History:  Diagnosis Date  . Anxiety   . Asthma   . Chest pain    a. 2003 Cath: nl cors.  [name] COPD (chronic obstructive pulmonary disease) ([place])   . Depression   . Diabetes mellitus   . Dyslipidemia   . Dysrhythmia   . Fracture 12/15   lower back "L-1"  . GERD (gastroesophageal reflux disease)   . Hypertension   . PONV (postoperative nausea and vomiting)   . Splenomegaly     Past Surgical History:  Procedure Laterality Date  . BIOPSY  07/26/2016   Procedure: BIOPSY;  Surgeon: [name], MD;  [facility];  Service: Endoscopy;;  gastric  . CARDIAC CATHETERIZATION  10/22/2001   normal coronary arteriea (Dr. Domenic Moras)  . COLONOSCOPY  08/31/2011   [id number] rectal and colon polyps-treated. Single tubular adenoma. Next TCS 08/2016  . COLONOSCOPY N/A 07/26/2016   Procedure: COLONOSCOPY;  Surgeon: [name], MD;  [facility];  Service: Endoscopy;  Laterality: N/A;  2:30 PM  . ESOPHAGOGASTRODUODENOSCOPY N/A 07/26/2016   Procedure: ESOPHAGOGASTRODUODENOSCOPY (EGD);  Surgeon: [name], MD;  [facility];  Service: Endoscopy;  Laterality: N/A;  . ESOPHAGOGASTRODUODENOSCOPY (EGD) WITH ESOPHAGEAL DILATION N/A 05/15/2013   XFG:[id number] esophagus-status post Venia Minks dilation/Portal gastropathy. Antral erosions-status post biopsy. extrinsic compression along lesser curvature likely secondary to splenomegaly. gastric bx benign  . HERNIA REPAIR  03/2009  . POLYPECTOMY  07/26/2016   Procedure: POLYPECTOMY;  Surgeon: [name], MD;  [facility];  Service: Endoscopy;;  colon  . TRANSTHORACIC ECHOCARDIOGRAM  2011   EF 94-49%, stage 1 diastolic dysfunction, trace TR & pulm valve regurg   . VASECTOMY  1989    Family History  Problem Relation Age of Onset  . Heart disease Mother   . Cancer Mother        lymph nodes  .  Diabetes Mother   . Arthritis Unknown   . Lung disease Unknown   . Asthma Unknown   . Kidney disease Unknown   . Ovarian cancer Sister   . Diabetes Brother   . Heart disease Brother   . Hypertension Brother   . Diabetes Sister   . Diabetes Sister   . Cirrhosis Sister 48       no etoh  . Colon cancer Neg Hx     Social History   Social History  . Marital status: Married    Spouse name: N/A  . Number of children: N/A  . Years of education: N/A   Social History Main Topics  . Smoking status: Former Smoker    Quit date: 10/06/2011  . Smokeless tobacco: Former Systems developer     Comment: quit x 2 years  . Alcohol use No  . Drug use: No  . Sexual activity: Not Asked   Other Topics Concern  . None   Social History Narrative  . None     PHYSICAL EXAMINATION  ECOG PERFORMANCE STATUS: 1 - Symptomatic but completely ambulatory  Vitals:   11/15/16 1003  BP: 107/63  Pulse: 88  Resp: 18  Temp: 98.2 F (36.8 C)    GENERAL:alert, no distress, well nourished, well developed, comfortable, cooperative, smiling and unaccompanied SKIN: skin color, texture, turgor are normal, no rashes or significant lesions HEAD: Normocephalic, No masses, lesions, tenderness or abnormalities EYES: normal, EOMI, Conjunctiva are pink and non-injected EARS: External ears normal OROPHARYNX:lips, buccal mucosa, and tongue normal and mucous membranes are moist  NECK: supple, no adenopathy, trachea midline LYMPH:  no palpable lymphadenopathy BREAST:not examined LUNGS: clear to auscultation  HEART: regular rate & rhythm, no murmurs, no gallops, S1 normal and S2 normal ABDOMEN:abdomen soft and normal bowel sounds BACK: Back symmetric, no curvature. EXTREMITIES:less then 2 second capillary refill, no joint deformities, effusion, or inflammation, no skin discoloration, no cyanosis, no edema. NEURO: alert & oriented x 3 with fluent speech, no focal motor/sensory deficits, gait normal   LABORATORY  DATA: CBC    Component Value Date/Time   WBC 4.6 11/15/2016 0919   RBC 5.79 11/15/2016 0919   HGB 15.8 11/15/2016 0919   HCT 48.0 11/15/2016 0919   PLT 88 (L) 11/15/2016 0919   MCV 82.9 11/15/2016 0919   MCH 27.3 11/15/2016 0919   MCHC 32.9 11/15/2016 0919   RDW 14.8 11/15/2016 0919   LYMPHSABS 1.3 11/15/2016 0919   MONOABS 0.3 11/15/2016 0919   EOSABS 0.3 11/15/2016 0919   BASOSABS 0.1 11/15/2016 0919      Chemistry      Component Value Date/Time   NA 138 11/15/2016 0919   K 4.4 11/15/2016 0919   CL 103 11/15/2016 0919   CO2 26 11/15/2016 0919   BUN 20 11/15/2016 0919   CREATININE 1.11 11/15/2016 0919   CREATININE 0.86 04/24/2013 0756      Component Value Date/Time   CALCIUM 9.3 11/15/2016 0919   ALKPHOS 60 11/15/2016 0919   ALKPHOS 41 06/22/2011   AST 33 11/15/2016 0919   AST 22 06/22/2011   ALT 50 11/15/2016 0919  BILITOT 0.7 11/15/2016 0919   BILITOT 0.5 06/22/2011        PENDING LABS:   RADIOGRAPHIC STUDIES:  No results found.   PATHOLOGY:    ASSESSMENT AND PLAN:  Thrombocytopenia Mild thrombocytopenia and leukopenia in the setting of splenomegaly with fatty infiltration of liver; DM and intestinal paresis.  Labs today: CBC diff, CMET. I personally reviewed and went over laboratory results with the patient.  The results are noted within this dictation.    WBC is normal HGB is normal Thrombocytopenia is noted and stable at 88,000.  Labs in 9 months: CBC diff, CMET.  He is S/P EGD/Colonoscopy by Dr. [name] in Jan 2018.  This demonstrated diverticulosis, a single colon polyp that was resected, and portal gastropathy.   Patient reports that it is recommended that he have a repeat colonoscopy in 5 years (2023) and EGD in 2 years (2020).  I personally reviewed and went over pathology results with the patient.  Pathology was negative for any dysplasia and malignancy.  Return in 9 months for follow-up.  He knows that we can perform blood work and  see him sooner than planned if needed.  He will call sooner if needed if he develops abnormal bruising or bleeding.   Number of Diagnoses or Treatment Options- Section A:      Problems to Exam Physician Problem(s) Number x Points= Results  Self-limited or minor (stable, improved, or worsening)  Max=2  1 2   Est. Problem (to examiner); stable, improved   1   Est. Problem (to examiner); worsening   2 2  New problem (to examiner); no additional work-up planned  Max=1 3   New problem (to examiner); add. work-up planned   4      Total: 4    Amount and/or Complexity of Data to be Reviewed- Section B    Data to be reviewed: Points    Review and/or order of clinical lab tests 1  [x]    Review and/or order of tests in the radiology section of CPT (includes nuclear med & other except cardiac cath & ECG) 1  []    Review and/or order of tests in the medicine section of CPT (e.g. EKG, cardiac cath, non-invasive vascular studies, pulmonary function studies) 1  []    Discussion of test results with performing physician 1  []    Decision to obtain old records and/or obtaining history from someone other than patient 1  []    Review and summarization of old records and/or obtaining history from someone other than patient and/or discussion of case with another health care provider 2  [x]    Independent visualization of image, tracing, or specimen itself (not simply review report) 2  [x]    Total:  5     Risk of  complications and/or Morbidity or Mortality- Section C  Level of Risk: Presenting Problem(s) Diagnostic Procedure(s) Ordered Management Options Selected  Minimal One self-limited or minor problem (eg cold, insect bite, tinea corporis)  Lab test requiring venipuncture  Chest xray  EKG/EEG  [place] or Echo  KOH prep  Urinalysis  Rest  Gargles  Elastic bandages  Superficial dressings  Low  Two or more self-limited or minor problems  One stable chronic illness (well-controlled HTN, non-insulin  dependent diabetes, cataract, BPH)  Acute uncomplicated illness or injury (cystitis, allergic rhinitis, simple sprain)  Physiologic test not under stress (pulm fnx tests)  Non-cardiovascular imaging studies with contrast (barium enema)  Superficial needle biopsy  Clinical laboratory tests requiring arterial puncture  Skin  biopsies  OTC drugs  Minor surgery with no identified risk factors  Physical therapy  Occupational therapy  IV fluids without additives  Moderate  One or more chronic illnesses with mild exacerbation, progression, or side effects of treatment  Two or more stable chronic illnesses  Undiagnosed new problem with uncertain prognosis (lump in breast)  Acute illness with systemic symptoms (pyelonephritis, pneumonitis, colitis)  Acute complicated injury (head injury with brief loss of consciousness)  Physiologic test under stress (cardiac stress test, fetal contraction stress test)  Diagnostic endoscopies with no identified risk factors  Deep needle or incisional biopsy  Cardiovascular imaging studies with contrast and no identified risk factors (arteriogram, cardiac cath)  Obtain fluid from body cavity (LP, thoracentesis, culdocentesis)  Minor surgery with identified risk factors  Elective surgery (open, percutaneous, or endoscopic) with no identified risk factors  Prescription drug management  Therapeutic nuclear medicine  IV fluids with additives  Closed treatment of fracture of dislocation without manipulation  High  One or more chronic illnesses with severe exacerbation, progression, or side effects of treatment.  Acute or chronic illnesses or injuries that may pose a threat to life or bodily function (multiple trauma, acute MI, PE, severe respiratory distress, progressive severe rheumatoid arthritis, psychiatric illness with potential threat to self or others, peritonitis, acute renal failure)  An abrupt change in neurological status (seizure,  TIA, weakness, sensory loss)  Cardiovascular imaging studies with contrast with identified risk factors  Cardiac electrophysiological tests  Diagnostic endoscopies with identified risk factors  Discography   Elective major surgery (open, percutaneous, or endoscopic) with identified risk factor  Emergency major surgery (open, percutaneous, or endoscopic)  Parental controlled substances  Drug therapy requiring intensive monitoring for toxicity  Decision not to resuscitate or deescalate care because of poor prognosis     Final Result of Complexity      Choose decision making level with 2 or 3 checks OR choose the decision making level on Section B       A Number of diagnoses or treatment options  []   </= 1 Minimal  []   2 Limited  []   3 Multiple  [x]   >/= 4 Extensive  B Amount and complexity of data  []   </= 1 Minimal or low  []   2 Limited  []   3  Moderate  [x]   >/= 4 Extensive  C Highest risk  []   Minimal  []   Low  [x]   Moderate  []   High   Type of decision making  []   Straight-forward  []   Low Complexity  []   Moderate- Complexity  [x]   High- Complexity     ORDERS PLACED FOR THIS ENCOUNTER: Orders Placed This Encounter  Procedures  . CBC with Differential  . Comprehensive metabolic panel    MEDICATIONS PRESCRIBED THIS ENCOUNTER: No orders of the defined types were placed in this encounter.   THERAPY PLAN:  Continue to monitor leukopenia and thrombocytopenia.  All questions were answered. The patient knows to call the clinic with any problems, questions or concerns. We can certainly see the patient much sooner if necessary.  Patient and plan discussed with Dr. Twana First and she is in agreement with the aforementioned.   This note is electronically signed by: Doy Mince 11/15/2016 10:39 AM

## 2016-12-26 DIAGNOSIS — E1165 Type 2 diabetes mellitus with hyperglycemia: Secondary | ICD-10-CM | POA: Diagnosis not present

## 2017-01-01 DIAGNOSIS — M545 Low back pain: Secondary | ICD-10-CM | POA: Diagnosis not present

## 2017-01-01 DIAGNOSIS — J449 Chronic obstructive pulmonary disease, unspecified: Secondary | ICD-10-CM | POA: Diagnosis not present

## 2017-01-01 DIAGNOSIS — I10 Essential (primary) hypertension: Secondary | ICD-10-CM | POA: Diagnosis not present

## 2017-01-01 DIAGNOSIS — E1165 Type 2 diabetes mellitus with hyperglycemia: Secondary | ICD-10-CM | POA: Diagnosis not present

## 2017-01-31 ENCOUNTER — Encounter: Payer: Self-pay | Admitting: Gastroenterology

## 2017-01-31 ENCOUNTER — Ambulatory Visit (INDEPENDENT_AMBULATORY_CARE_PROVIDER_SITE_OTHER): Payer: PPO | Admitting: Gastroenterology

## 2017-01-31 ENCOUNTER — Other Ambulatory Visit (HOSPITAL_COMMUNITY)
Admission: RE | Admit: 2017-01-31 | Discharge: 2017-01-31 | Disposition: A | Discharge status: Home / Self Care | Payer: PPO | Source: Ambulatory Visit | Attending: Gastroenterology | Admitting: Gastroenterology

## 2017-01-31 VITALS — BP 114/66 | HR 84 | Temp 97.5°F | Ht 67.0 in | Wt 164.0 lb

## 2017-01-31 DIAGNOSIS — K7400 Hepatic fibrosis, unspecified: Secondary | ICD-10-CM | POA: Insufficient documentation

## 2017-01-31 NOTE — Patient Instructions (Addendum)
Please complete the labs today. We will see what everything looks like.   We have ordered a routine ultrasound of your liver, which we will likely do every 6 months.  I will see if you need the Hepatitis A or B vaccination after review of blood work.  We will see you in 6 months!

## 2017-01-31 NOTE — Progress Notes (Signed)
Referring Provider: Sinda Du, MD Primary Care Physician:  Sinda Du, MD Primary GI: Dr. [name]   Chief Complaint  Patient presents with  . Gastroesophageal Reflux    HPI:   [name] is a 61 y.o. male presenting today with a history of GERD, splenomegaly, portal gastropathy. Chronic thrombocytopenia, ultrasound with elastography in 2015 with F score of 0 now progressed to F2./F3. Family history of cirrhosis (sister) early age. Prior evaluation with negative Hep C and Hep B negative, negative hemochromatosis.   Protonix once daily. Recent EGD/TCS in Jan 2018 with portal gastropathy, chronic gastritis. EGD in 2 years. Diverticulosis in colon, benign polyp, surveillance in 5 years. No abdominal pain, N/V, rectal bleeding. No problems with appetite. LFTs normal 2 months ago in May 2018.    Past Medical History:  Diagnosis Date  . Anxiety   . Asthma   . Chest pain    a. 2003 Cath: nl cors.  [name] COPD (chronic obstructive pulmonary disease) ([place])   . Depression   . Diabetes mellitus   . Dyslipidemia   . Dysrhythmia   . Fracture 12/15   lower back "L-1"  . GERD (gastroesophageal reflux disease)   . Hypertension   . PONV (postoperative nausea and vomiting)   . Splenomegaly     Past Surgical History:  Procedure Laterality Date  . BIOPSY  07/26/2016   Procedure: BIOPSY;  Surgeon: [name], MD;  [facility];  Service: Endoscopy;;  gastric  . CARDIAC CATHETERIZATION  10/22/2001   normal coronary arteriea (Dr. Domenic Moras)  . COLONOSCOPY  08/31/2011   [id number] rectal and colon polyps-treated. Single tubular adenoma. Next TCS 08/2016  . COLONOSCOPY N/A 07/26/2016   Dr. [name]: diverticulosis in sigmoid and descending colon. 6 mm benign splenic flexure. surveillance 5 yeras  . ESOPHAGOGASTRODUODENOSCOPY N/A 07/26/2016   Dr. [name]: normal esophagus, portal gastropathy, chronic gastritis, normal duodenum, screening EGD 2 years   .  ESOPHAGOGASTRODUODENOSCOPY (EGD) WITH ESOPHAGEAL DILATION N/A 05/15/2013   [id number] esophagus-status post Venia Minks dilation/Portal gastropathy. Antral erosions-status post biopsy. extrinsic compression along lesser curvature likely secondary to splenomegaly. gastric bx benign  . HERNIA REPAIR  03/2009  . POLYPECTOMY  07/26/2016   Procedure: POLYPECTOMY;  Surgeon: [name], MD;  [facility];  Service: Endoscopy;;  colon  . TRANSTHORACIC ECHOCARDIOGRAM  2011   EF 95-09%, stage 1 diastolic dysfunction, trace TR & pulm valve regurg   . VASECTOMY  1989    Current Outpatient Prescriptions  Medication Sig Dispense Refill  . acetaminophen (TYLENOL) 500 MG tablet Take 1,000 mg by mouth daily as needed for headache.    . ALPRAZolam (XANAX) 0.25 MG tablet Take 0.25 mg by mouth 2 (two) times daily.     . Artificial Tear Ointment (REFRESH P.M. OP) Apply 1 application to eye daily as needed (dry eye). As directed     . aspirin EC 81 MG tablet Take 81 mg by mouth daily.    [name] atorvastatin (LIPITOR) 40 MG tablet Take 1 tablet (40 mg total) by mouth daily. (Patient taking differently: Take 40 mg by mouth every evening. ) 90 tablet 3  . Choline Fenofibrate (FENOFIBRIC ACID) 135 MG CPDR Take 1 capsule by mouth daily. 90 capsule 3  . clotrimazole (LOTRIMIN) 1 % cream Apply 1 application topically daily as needed (psoriasis).     [name] diltiazem (CARDIZEM CD) 240 MG 24 hr capsule Take 1 capsule (240 mg total) by mouth daily.  30 capsule 11  . fluticasone (FLONASE) 50 MCG/ACT nasal spray Place 1-2 sprays into both nostrils daily as needed for allergies.    [name] HYDROcodone-acetaminophen (NORCO/VICODIN) 5-325 MG per tablet Take 1 tablet by mouth every 8 (eight) hours as needed for moderate pain. 90 tablet 0  . JARDIANCE 25 MG TABS tablet Take 25 mg by mouth daily.  3  . Multiple Vitamin (MULITIVITAMIN WITH MINERALS) TABS Take 1 tablet by mouth daily.    . pantoprazole (PROTONIX) 40 MG tablet TAKE 1 TABLET  (40 MG TOTAL) BY MOUTH DAILY. 90 tablet 2  . PROAIR HFA 108 (90 Base) MCG/ACT inhaler Inhale 2 puffs into the lungs every 4 (four) hours as needed for wheezing or shortness of breath.   12  . SitaGLIPtin-MetFORMIN HCl (JANUMET XR) 904 129 8294 MG TB24 Take 1 tablet by mouth daily.    . tamsulosin (FLOMAX) 0.4 MG CAPS capsule Take 0.4 mg by mouth daily.    [name] tiotropium (SPIRIVA) 18 MCG inhalation capsule Place 18 mcg into inhaler and inhale daily.    . ondansetron (ZOFRAN-ODT) 4 MG disintegrating tablet Take 1 tablet (4 mg total) by mouth every 8 (eight) hours as needed for nausea or vomiting. (Patient not taking: Reported on 11/15/2016) 20 tablet 0   No current facility-administered medications for this visit.     Allergies as of 01/31/2017 - Review Complete 01/31/2017  Allergen Reaction Noted  . Codeine Rash 07/24/2011  . Baclofen  07/24/2016  . Simvastatin Nausea Only 10/05/2013    Family History  Problem Relation Age of Onset  . Heart disease Mother   . Cancer Mother        lymph nodes  . Diabetes Mother   . Arthritis Unknown   . Lung disease Unknown   . Asthma Unknown   . Kidney disease Unknown   . Ovarian cancer Sister   . Diabetes Brother   . Heart disease Brother   . Hypertension Brother   . Diabetes Sister   . Diabetes Sister   . Cirrhosis Sister 43       no etoh  . Colon cancer Neg Hx     Social History   Social History  . Marital status: Married    Spouse name: N/A  . Number of children: N/A  . Years of education: N/A   Social History Main Topics  . Smoking status: Former Smoker    Quit date: 10/06/2011  . Smokeless tobacco: Former Systems developer     Comment: quit x 2 years  . Alcohol use No  . Drug use: No  . Sexual activity: Not Asked   Other Topics Concern  . None   Social History Narrative  . None    Review of Systems: As mentioned in HPI   Physical Exam: BP 114/66   Pulse 84   Temp (!) 97.5 F (36.4 C) (Oral)   Ht 5' 7"  (1.702 m)   Wt 164 lb (74.4  kg)   BMI 25.69 kg/m  General:   Alert and oriented. No distress noted. Pleasant and cooperative.  Head:  Normocephalic and atraumatic. Eyes:  Conjuctiva clear without scleral icterus. Mouth:  Oral mucosa pink and moist. Abdomen:  +BS, soft, non-tender and non-distended. No rebound or guarding. No HSM or masses noted. Msk:  Symmetrical without gross deformities. Normal posture. Extremities:  Without edema. Neurologic:  Alert and  oriented x4;  grossly normal neurologically. Psych:  Alert and cooperative. Normal mood and affect.  Lab Results  Component Value Date  ALT 50 11/15/2016   AST 33 11/15/2016   ALKPHOS 60 11/15/2016   BILITOT 0.7 11/15/2016

## 2017-01-31 NOTE — Patient Instructions (Signed)
Received fax from [facility]. Case approved for [place] abd RUQ. Auth# [id number], 01/31/17-05/03/17.

## 2017-01-31 NOTE — Patient Instructions (Signed)
PA info for [place] abd RUQ submitted via [employer]. Case Suspended. Outpatient Authorization# [id number].

## 2017-02-01 ENCOUNTER — Encounter: Payer: Self-pay | Admitting: Gastroenterology

## 2017-02-01 LAB — ANTI-SMOOTH MUSCLE ANTIBODY, IGG: F-Actin IgG: 26 Units — ABNORMAL HIGH (ref 0–19)

## 2017-02-01 LAB — ANTINUCLEAR ANTIBODIES, IFA: ANTINUCLEAR ANTIBODIES, IFA: NEGATIVE

## 2017-02-01 LAB — HEPATITIS B SURFACE ANTIGEN: HEP B S AG: NEGATIVE

## 2017-02-01 LAB — ALPHA-1 ANTITRYPSIN PHENOTYPE: A-1 Antitrypsin, Ser: 169 mg/dL (ref 90–200)

## 2017-02-01 LAB — MITOCHONDRIAL ANTIBODIES: MITOCHONDRIAL M2 AB, IGG: 14.6 U (ref 0.0–20.0)

## 2017-02-01 LAB — CERULOPLASMIN: Ceruloplasmin: 21 mg/dL (ref 16.0–31.0)

## 2017-02-01 LAB — HEPATITIS A ANTIBODY, TOTAL: HEP A TOTAL AB: NEGATIVE

## 2017-02-01 NOTE — Assessment & Plan Note (Signed)
Chronic thrombocytopenia, elastography with F2/F3 score, splenomegaly. Family history of cirrhosis. Prior evaluation negative for Hep B, C, negative hemochromatosis. Portal gastropathy on EGD, with screening due in 2020. Up-to-date on colonoscopy. Will proceed with further extensive serologies. I did discuss with patient that if anything were abnormal or unclear from serologies, that a liver biopsy could be considered. Likely dealing with NASH fibrosis/early cirrhosis. Proceed with routine ultrasound now. Will assess A/B immunity status and recommend vaccinations depending on results. Close follow-up in 6 months.

## 2017-02-01 NOTE — Progress Notes (Signed)
cc'ed to pcp °

## 2017-02-06 NOTE — Progress Notes (Signed)
AMA is normal. ASMA is weakly positive, non-specific. Negative Hep B surface antigen.   Needs Hep A immunization. Where is Hep B surface antibody? I had ordered this but it wasn't completed. I would have him consider a liver biopsy. Is he willing to do this?

## 2017-02-08 LAB — HEPATITIS B SURFACE ANTIBODY,QUALITATIVE: Hep B S Ab: NONREACTIVE

## 2017-02-11 ENCOUNTER — Other Ambulatory Visit: Payer: Self-pay

## 2017-02-11 DIAGNOSIS — K7400 Hepatic fibrosis, unspecified: Secondary | ICD-10-CM

## 2017-02-11 NOTE — Progress Notes (Signed)
Please make sure it is transjugular liver biopsy with wedge pressure measurements.

## 2017-02-12 ENCOUNTER — Ambulatory Visit (HOSPITAL_COMMUNITY)
Admission: RE | Admit: 2017-02-12 | Discharge: 2017-02-12 | Disposition: A | Discharge status: Home / Self Care | Payer: PPO | Source: Ambulatory Visit | Attending: Gastroenterology | Admitting: Gastroenterology

## 2017-02-12 ENCOUNTER — Other Ambulatory Visit: Payer: Self-pay

## 2017-02-12 DIAGNOSIS — K7400 Hepatic fibrosis, unspecified: Secondary | ICD-10-CM

## 2017-02-12 DIAGNOSIS — K760 Fatty (change of) liver, not elsewhere classified: Secondary | ICD-10-CM | POA: Insufficient documentation

## 2017-02-12 DIAGNOSIS — R945 Abnormal results of liver function studies: Secondary | ICD-10-CM | POA: Diagnosis not present

## 2017-02-12 NOTE — Progress Notes (Signed)
Fatty liver. Will await liver biopsy.

## 2017-02-14 ENCOUNTER — Ambulatory Visit (HOSPITAL_COMMUNITY): Payer: PPO

## 2017-02-20 ENCOUNTER — Other Ambulatory Visit: Payer: Self-pay | Admitting: Radiology

## 2017-02-21 ENCOUNTER — Ambulatory Visit (HOSPITAL_COMMUNITY)
Admission: RE | Admit: 2017-02-21 | Discharge: 2017-02-21 | Disposition: A | Discharge status: Home / Self Care | Payer: PPO | Source: Ambulatory Visit | Attending: Gastroenterology | Admitting: Gastroenterology

## 2017-02-21 ENCOUNTER — Encounter (HOSPITAL_COMMUNITY): Payer: Self-pay

## 2017-02-21 DIAGNOSIS — R740 Nonspecific elevation of levels of transaminase and lactic acid dehydrogenase [LDH]: Secondary | ICD-10-CM | POA: Insufficient documentation

## 2017-02-21 DIAGNOSIS — K759 Inflammatory liver disease, unspecified: Secondary | ICD-10-CM | POA: Diagnosis not present

## 2017-02-21 DIAGNOSIS — K760 Fatty (change of) liver, not elsewhere classified: Secondary | ICD-10-CM | POA: Insufficient documentation

## 2017-02-21 DIAGNOSIS — K7400 Hepatic fibrosis, unspecified: Secondary | ICD-10-CM

## 2017-02-21 DIAGNOSIS — R7989 Other specified abnormal findings of blood chemistry: Secondary | ICD-10-CM | POA: Diagnosis present

## 2017-02-21 DIAGNOSIS — R945 Abnormal results of liver function studies: Secondary | ICD-10-CM | POA: Diagnosis not present

## 2017-02-21 LAB — CBC
HEMATOCRIT: 49 % (ref 39.0–52.0)
HEMOGLOBIN: 16.3 g/dL (ref 13.0–17.0)
MCH: 27.5 pg (ref 26.0–34.0)
MCHC: 33.3 g/dL (ref 30.0–36.0)
MCV: 82.6 fL (ref 78.0–100.0)
Platelets: 102 10*3/uL — ABNORMAL LOW (ref 150–400)
RBC: 5.93 MIL/uL — ABNORMAL HIGH (ref 4.22–5.81)
RDW: 14.2 % (ref 11.5–15.5)
WBC: 3.6 10*3/uL — AB (ref 4.0–10.5)

## 2017-02-21 LAB — BASIC METABOLIC PANEL
ANION GAP: 11 (ref 5–15)
BUN: 17 mg/dL (ref 6–20)
CALCIUM: 9.1 mg/dL (ref 8.9–10.3)
CO2: 24 mmol/L (ref 22–32)
Chloride: 104 mmol/L (ref 101–111)
Creatinine, Ser: 1.05 mg/dL (ref 0.61–1.24)
GFR calc Af Amer: >60 mL/min (ref >60)
Glucose, Bld: 165 mg/dL — ABNORMAL HIGH (ref 65–99)
POTASSIUM: 3.8 mmol/L (ref 3.5–5.1)
SODIUM: 139 mmol/L (ref 135–145)

## 2017-02-21 LAB — PROTIME-INR
INR: 1
PROTHROMBIN TIME: 13.2 s (ref 11.4–15.2)

## 2017-02-21 MED ORDER — LIDOCAINE HCL (PF) 1 % IJ SOLN
INTRAMUSCULAR | Status: AC
  Filled 2017-02-21: qty 30

## 2017-02-21 MED ORDER — LIDOCAINE-EPINEPHRINE 1 %-1:100000 IJ SOLN
INTRAMUSCULAR | Status: AC
  Filled 2017-02-21: qty 1

## 2017-02-21 MED ORDER — MIDAZOLAM HCL 2 MG/2ML IJ SOLN
INTRAMUSCULAR | Status: AC | PRN
  Administered 2017-02-21 (×2): 0.5 mg via INTRAVENOUS
  Administered 2017-02-21: 1 mg via INTRAVENOUS

## 2017-02-21 MED ORDER — MIDAZOLAM HCL 2 MG/2ML IJ SOLN
INTRAMUSCULAR | Status: AC
  Filled 2017-02-21: qty 2

## 2017-02-21 MED ORDER — SODIUM CHLORIDE 0.9 % IV SOLN: INTRAVENOUS | Status: DC

## 2017-02-21 MED ORDER — FENTANYL CITRATE (PF) 100 MCG/2ML IJ SOLN
INTRAMUSCULAR | Status: AC
  Filled 2017-02-21: qty 2

## 2017-02-21 MED ORDER — GELATIN ABSORBABLE 12-7 MM EX MISC
CUTANEOUS | Status: AC
  Filled 2017-02-21: qty 1

## 2017-02-21 MED ORDER — SODIUM CHLORIDE 0.9 % IV SOLN
INTRAVENOUS | Status: AC | PRN
  Administered 2017-02-21: 10 mL/h via INTRAVENOUS

## 2017-02-21 MED ORDER — FENTANYL CITRATE (PF) 100 MCG/2ML IJ SOLN
INTRAMUSCULAR | Status: AC | PRN
  Administered 2017-02-21: 50 ug via INTRAVENOUS
  Administered 2017-02-21 (×2): 25 ug via INTRAVENOUS

## 2017-02-21 NOTE — Discharge Instructions (Signed)
Liver Biopsy, Care After °These instructions give you information on caring for yourself after your procedure. Your doctor may also give you more specific instructions. Call your doctor if you have any problems or questions after your procedure. °Follow these instructions at home: °· Rest at home for 1-2 days or as told by your doctor. °· Have someone stay with you for at least 24 hours. °· Do not do these things in the first 24 hours: °? Drive. °? Use machinery. °? Take care of other people. °? Sign legal documents. °? Take a bath or shower. °· There are many different ways to close and cover a cut (incision). For example, a cut can be closed with stitches, skin glue, or adhesive strips. Follow your doctor's instructions on: °? Taking care of your cut. °? Changing and removing your bandage (dressing). °? Removing whatever was used to close your cut. °· Do not drink alcohol in the first week. °· Do not lift more than 5 pounds or play contact sports for the first 2 weeks. °· Take medicines only as told by your doctor. For 1 week, do not take medicine that has aspirin in it or medicines like ibuprofen. °· Get your test results. °Contact a doctor if: °· A cut bleeds and leaves more than just a small spot of blood. °· A cut is red, puffs up (swells), or hurts more than before. °· Fluid or something else comes from a cut. °· A cut smells bad. °· You have a fever or chills. °Get help right away if: °· You have swelling, bloating, or pain in your belly (abdomen). °· You get dizzy or faint. °· You have a rash. °· You feel sick to your stomach (nauseous) or throw up (vomit). °· You have trouble breathing, feel short of breath, or feel faint. °· Your chest hurts. °· You have problems talking or seeing. °· You have trouble balancing or moving your arms or legs. °This information is not intended to replace advice given to you by your health care provider. Make sure you discuss any questions you have with your health care  provider. °Document Released: 04/03/2008 Document Revised: 12/01/2015 Document Reviewed: 08/21/2013 °Elsevier Interactive Patient Education © 2018 [employer] ° ° ° ° °Moderate Conscious Sedation, Adult, Care After °These instructions provide you with information about caring for yourself after your procedure. Your health care provider may also give you more specific instructions. Your treatment has been planned according to current medical practices, but problems sometimes occur. Call your health care provider if you have any problems or questions after your procedure. °What can I expect after the procedure? °After your procedure, it is common: °· To feel sleepy for several hours. °· To feel clumsy and have poor balance for several hours. °· To have poor judgment for several hours. °· To vomit if you eat too soon. ° °Follow these instructions at home: °For at least 24 hours after the procedure: ° °· Do not: °? Participate in activities where you could fall or become injured. °? Drive. °? Use heavy machinery. °? Drink alcohol. °? Take sleeping pills or medicines that cause drowsiness. °? Make important decisions or sign legal documents. °? Take care of children on your own. °· Rest. °Eating and drinking °· Follow the diet recommended by your health care provider. °· If you vomit: °? Drink water, juice, or soup when you can drink without vomiting. °? Make sure you have little or no nausea before eating solid foods. °General instructions °·   Have a responsible adult stay with you until you are awake and alert. °· Take over-the-counter and prescription medicines only as told by your health care provider. °· If you smoke, do not smoke without supervision. °· Keep all follow-up visits as told by your health care provider. This is important. °Contact a health care provider if: °· You keep feeling nauseous or you keep vomiting. °· You feel light-headed. °· You develop a rash. °· You have a fever. °Get help right away  if: °· You have trouble breathing. °This information is not intended to replace advice given to you by your health care provider. Make sure you discuss any questions you have with your health care provider. °Document Released: 04/15/2013 Document Revised: 11/28/2015 Document Reviewed: 10/15/2015 °Elsevier Interactive Patient Education © 2018 Elsevier Inc. ° ° °

## 2017-02-21 NOTE — Discharge Instructions (Signed)
Liver Biopsy, Care After These instructions give you information on caring for yourself after your procedure. Your doctor may also give you more specific instructions. Call your doctor if you have any problems or questions after your procedure. Follow these instructions at home:  Rest at home for 1-2 days or as told by your doctor.  Have someone stay with you for at least 24 hours.  Do not do these things in the first 24 hours: ? Drive. ? Use machinery. ? Take care of other people. ? Sign legal documents. ? Take a bath or shower.  There are many different ways to close and cover a cut (incision). For example, a cut can be closed with stitches, skin glue, or adhesive strips. Follow your doctor's instructions on: ? Taking care of your cut. ? Changing and removing your bandage (dressing). ? Removing whatever was used to close your cut.  Do not drink alcohol in the first week.  Do not lift more than 5 pounds or play contact sports for the first 2 weeks.  Take medicines only as told by your doctor. For 1 week, do not take medicine that has aspirin in it or medicines like ibuprofen.  Get your test results. Contact a doctor if:  A cut bleeds and leaves more than just a small spot of blood.  A cut is red, puffs up (swells), or hurts more than before.  Fluid or something else comes from a cut.  A cut smells bad.  You have a fever or chills. Get help right away if:  You have swelling, bloating, or pain in your belly (abdomen).  You get dizzy or faint.  You have a rash.  You feel sick to your stomach (nauseous) or throw up (vomit).  You have trouble breathing, feel short of breath, or feel faint.  Your chest hurts.  You have problems talking or seeing.  You have trouble balancing or moving your arms or legs. This information is not intended to replace advice given to you by your health care provider. Make sure you discuss any questions you have with your health care  provider. Document Released: 04/03/2008 Document Revised: 12/01/2015 Document Reviewed: 08/21/2013 Elsevier Interactive Patient Education  [employer].

## 2017-02-21 NOTE — Procedures (Signed)
Pre Procedure Dx: Elevated LFTs Post Procedural Dx: Same  Technically successful US guided biopsy of right lobe of the liver.  EBL: Minimal  No immediate complications.   Ronny Bacon, MD Pager #: [phone]

## 2017-02-21 NOTE — Progress Notes (Signed)
Chief Complaint: Patient was seen in consultation today for liver core biopsy at the request of [name]  Referring Physician(s): [name] Dr Burnard Bunting  Supervising Physician: [name]  Patient Status: [facility] - Out-pt  History of Present Illness: [name] is a 61 y.o. male   Elevated LFTs Fibrosis; NASH; fatty liver Follows with Dr [name] [place]:  IMPRESSION: Fatty infiltration of the liver. No other definite abnormality seen in the right upper quadrant of the abdomen.  Scheduled now for liver core biopsy plt 102 today   Past Medical History:  Diagnosis Date  . Anxiety   . Asthma   . Chest pain    a. 2003 Cath: nl cors.  [name] COPD (chronic obstructive pulmonary disease) ([name])   . Depression   . Diabetes mellitus   . Dyslipidemia   . Dysrhythmia   . Fracture 12/15   lower back "L-1"  . GERD (gastroesophageal reflux disease)   . Hypertension   . PONV (postoperative nausea and vomiting)   . Splenomegaly     Past Surgical History:  Procedure Laterality Date  . BIOPSY  07/26/2016   Procedure: BIOPSY;  Surgeon: Christopher Dolin, MD;  Location: [facility];  Service: Endoscopy;;  gastric  . CARDIAC CATHETERIZATION  10/22/2001   normal coronary arteriea (Dr. Domenic [name])  . COLONOSCOPY  08/31/2011   [id number] rectal and colon polyps-treated. Single tubular adenoma. Next TCS 08/2016  . COLONOSCOPY N/A 07/26/2016   Dr. Gala [name]: diverticulosis in sigmoid and descending colon. 6 mm benign splenic flexure. surveillance 5 yeras  . ESOPHAGOGASTRODUODENOSCOPY N/A 07/26/2016   Dr. Gala [name]: normal esophagus, portal gastropathy, chronic gastritis, normal duodenum, screening EGD 2 years   . ESOPHAGOGASTRODUODENOSCOPY (EGD) WITH ESOPHAGEAL DILATION N/A 05/15/2013   GNF:[id number] esophagus-status post Venia Minks dilation/Portal gastropathy. Antral erosions-status post biopsy. extrinsic compression along lesser curvature likely secondary to splenomegaly. gastric bx benign  . HERNIA  REPAIR  03/2009  . POLYPECTOMY  07/26/2016   Procedure: POLYPECTOMY;  Surgeon: Christopher Dolin, MD;  Location: [facility];  Service: Endoscopy;;  colon  . TRANSTHORACIC ECHOCARDIOGRAM  2011   EF 65-78%, stage 1 diastolic dysfunction, trace TR & pulm valve regurg   . VASECTOMY  1989    Allergies: Codeine; Baclofen; and Simvastatin  Medications: Prior to Admission medications   Medication Sig Start Date End Date Taking? Authorizing Provider  acetaminophen (TYLENOL) 500 MG tablet Take 1,000 mg by mouth daily as needed for headache.    [provider]  ALPRAZolam [name]) 0.25 MG tablet Take 0.25 mg by mouth 2 (two) times daily.     [provider]  Artificial Tear Ointment (REFRESH P.M. OP) Apply 1 application to eye daily as needed (dry eye). As directed     [provider]  aspirin EC 81 MG tablet Take 81 mg by mouth at bedtime.     [provider]  atorvastatin (LIPITOR) 40 MG tablet Take 1 tablet (40 mg total) by mouth daily. Patient taking differently: Take 40 mg by mouth every evening.  04/16/16   [name], NP  Choline Fenofibrate (FENOFIBRIC ACID) 135 MG CPDR Take 1 capsule by mouth daily. Patient taking differently: Take 135 mg by mouth every evening.  04/16/16   [name], NP  clotrimazole-betamethasone (LOTRISONE) cream Apply 1 application topically 2 (two) times daily as needed (psoriasis).    [provider]  diltiazem (CARDIZEM CD) 240 MG 24 hr capsule Take 1 capsule (240 mg total)  by mouth daily. Patient taking differently: Take 240 mg by mouth every evening.  05/16/16   Hilty, [name], MD  fluticasone (FLONASE) 50 MCG/ACT nasal spray Place 2 sprays into both nostrils daily as needed for allergies.  06/26/16   [provider]  HYDROcodone-acetaminophen (NORCO/VICODIN) 5-325 MG per tablet Take 1 tablet by mouth every 8 (eight) hours as needed for moderate pain. Patient taking differently: Take 1 tablet by  mouth daily as needed for moderate pain.  11/09/14   Christopher Civil, MD  JARDIANCE 25 MG TABS tablet Take 25 mg by mouth every evening.  01/01/17   [provider]  Multiple Vitamin (MULITIVITAMIN WITH MINERALS) TABS Take 1 tablet by mouth at bedtime.     [provider]  neomycin-bacitracin-polymyxin (NEOSPORIN) ointment Apply 1 application topically daily as needed for wound care. apply to eye    [provider]  pantoprazole (PROTONIX) 40 MG tablet TAKE 1 TABLET (40 MG TOTAL) BY MOUTH DAILY. 11/08/16   Christopher Menghini, PA-C  PROAIR HFA [phone] Base) MCG/ACT inhaler Inhale 2 puffs into the lungs every 4 (four) hours as needed for wheezing or shortness of breath.  08/29/15   [provider]  SitaGLIPtin-MetFORMIN HCl (JANUMET XR) [phone] MG TB24 Take 1 tablet by mouth daily.    [provider]  tamsulosin (FLOMAX) 0.4 MG CAPS capsule Take 0.4 mg by mouth daily.    [provider]  tiotropium (SPIRIVA) 18 MCG inhalation capsule Place 18 mcg into inhaler and inhale daily.    [provider]     Family History  Problem Relation Age of Onset  . Heart disease Mother   . Cancer Mother        lymph nodes  . Diabetes Mother   . Arthritis Unknown   . Lung disease Unknown   . Asthma Unknown   . Kidney disease Unknown   . Ovarian cancer Sister   . Diabetes Brother   . Heart disease Brother   . Hypertension Brother   . Diabetes Sister   . Diabetes Sister   . Cirrhosis Sister 32       no etoh  . Colon cancer Neg Hx     Social History   Social History  . Marital status: Married    Spouse name: N/A  . Number of children: N/A  . Years of education: N/A   Social History Main Topics  . Smoking status: Former Smoker    Quit date: 10/06/2011  . Smokeless tobacco: Former Systems developer     Comment: quit x 2 years  . Alcohol use No  . Drug use: No  . Sexual activity: Not Asked   Other Topics Concern  . None   Social History Narrative    . None    Review of Systems: A 12 point ROS discussed and pertinent positives are indicated in the HPI above.  All other systems are negative.  Review of Systems  Constitutional: Positive for activity change and appetite change. Negative for fever.  Respiratory: Negative for cough and shortness of breath.   Gastrointestinal: Positive for abdominal distention. Negative for abdominal pain.  Psychiatric/Behavioral: Negative for behavioral problems and confusion.    Vital Signs: There were no vitals taken for this visit.  Physical Exam  Constitutional: He is oriented to person, place, and time. He appears well-nourished.  Cardiovascular: Normal rate and regular rhythm.   Pulmonary/Chest: Effort normal and breath sounds normal.  Abdominal: Soft. Bowel sounds are normal.  He exhibits distension.  Musculoskeletal: Normal range of motion.  Neurological: He is alert and oriented to person, place, and time.  Skin: Skin is warm and dry.  Psychiatric: He has a normal mood and affect. His behavior is normal. Judgment and thought content normal.  Nursing note and vitals reviewed.   Mallampati Score:  MD Evaluation Airway: WNL Heart: WNL Abdomen: WNL Chest/ Lungs: WNL ASA  Classification: 2 Mallampati/Airway Score: One  Imaging: US Abdomen Limited Ruq  Result Date: 02/12/2017 CLINICAL DATA:  Possible hepatic cirrhosis. EXAM: ULTRASOUND ABDOMEN LIMITED RIGHT UPPER QUADRANT COMPARISON:  Ultrasound of July 16, 2016. FINDINGS: Gallbladder: No gallstones or wall thickening visualized. No sonographic Murphy sign noted by sonographer. Common bile duct: Diameter: 3 mm which is within normal limits. Liver: No focal lesion identified. Increased echogenicity of hepatic parenchyma is noted consistent with fatty infiltration. IMPRESSION: Fatty infiltration of the liver. No other definite abnormality seen in the right upper quadrant of the abdomen. Electronically Signed   By: Marijo Conception, M.D.   On:  02/12/2017 09:20    Labs:  CBC:  Recent Labs  05/04/16 0941 11/15/16 0919 02/21/17 0719  WBC 3.8* 4.6 3.6*  HGB 15.9 15.8 16.3  HCT 48.7 48.0 49.0  PLT 93* 88* 102*    COAGS:  Recent Labs  02/21/17 0719  INR 1.00    BMP:  Recent Labs  11/15/16 0919 02/21/17 0719  NA 138 139  K 4.4 3.8  CL 103 104  CO2 26 24  GLUCOSE 253* 165*  BUN 20 17  CALCIUM 9.3 9.1  CREATININE 1.11 1.05  GFRNONAA >60 >60  GFRAA >60 >60    LIVER FUNCTION TESTS:  Recent Labs  11/15/16 0919  BILITOT 0.7  AST 33  ALT 50  ALKPHOS 60  PROT 7.8  ALBUMIN 4.2    TUMOR MARKERS: No results for input(s): AFPTM, CEA, CA199, CHROMGRNA in the last 8760 hours.  Assessment and Plan:  Elevated LFTs NASH For liver core biopsy: percutaneous vs transjugular access Risks and benefits discussed with the patient including, but not limited to bleeding, infection, damage to adjacent structures or low yield requiring additional tests. All of the patient's questions were answered, patient is agreeable to proceed. Consent signed and in chart.  Thank you for this interesting consult.  I greatly enjoyed meeting HARTWELL VANDIVER and look forward to participating in their care.  A copy of this report was sent to the requesting provider on this date.  Electronically Signed: Lavonia Drafts, PA-C 02/21/2017, 8:13 AM   I spent a total of  30 Minutes   in face to face in clinical consultation, greater than 50% of which was counseling/coordinating care for liver core biopsy

## 2017-03-04 ENCOUNTER — Telehealth: Payer: Self-pay

## 2017-03-04 NOTE — Telephone Encounter (Signed)
Pt is calling to see about his liver bx. Please advise

## 2017-03-04 NOTE — Telephone Encounter (Signed)
NASH fibrosis on path. Please see result note.

## 2017-03-04 NOTE — Progress Notes (Signed)
His liver biopsy is consistent with NASH fibrosis. He has had an extensive evaluation, and we can continue with surveillance as we have been doing. No need to change or add any medications at this point.

## 2017-03-05 NOTE — Telephone Encounter (Signed)
See result note.  

## 2017-03-19 DIAGNOSIS — M545 Low back pain: Secondary | ICD-10-CM | POA: Diagnosis not present

## 2017-03-19 DIAGNOSIS — J449 Chronic obstructive pulmonary disease, unspecified: Secondary | ICD-10-CM | POA: Diagnosis not present

## 2017-03-19 DIAGNOSIS — I10 Essential (primary) hypertension: Secondary | ICD-10-CM | POA: Diagnosis not present

## 2017-03-19 DIAGNOSIS — E1165 Type 2 diabetes mellitus with hyperglycemia: Secondary | ICD-10-CM | POA: Diagnosis not present

## 2017-03-26 DIAGNOSIS — Z0000 Encounter for general adult medical examination without abnormal findings: Secondary | ICD-10-CM | POA: Diagnosis not present

## 2017-03-26 DIAGNOSIS — Z23 Encounter for immunization: Secondary | ICD-10-CM | POA: Diagnosis not present

## 2017-04-10 DIAGNOSIS — Z1211 Encounter for screening for malignant neoplasm of colon: Secondary | ICD-10-CM | POA: Diagnosis not present

## 2017-04-27 ENCOUNTER — Other Ambulatory Visit: Payer: Self-pay | Admitting: Nurse Practitioner

## 2017-04-27 ENCOUNTER — Other Ambulatory Visit: Payer: Self-pay | Admitting: Internal Medicine

## 2017-05-07 ENCOUNTER — Encounter: Payer: Self-pay | Admitting: Internal Medicine

## 2017-05-07 ENCOUNTER — Ambulatory Visit (INDEPENDENT_AMBULATORY_CARE_PROVIDER_SITE_OTHER): Payer: PPO | Admitting: Internal Medicine

## 2017-05-07 VITALS — BP 100/56 | HR 75 | Ht 67.0 in | Wt 160.0 lb

## 2017-05-07 DIAGNOSIS — E785 Hyperlipidemia, unspecified: Secondary | ICD-10-CM

## 2017-05-07 DIAGNOSIS — E119 Type 2 diabetes mellitus without complications: Secondary | ICD-10-CM | POA: Diagnosis not present

## 2017-05-07 DIAGNOSIS — I10 Essential (primary) hypertension: Secondary | ICD-10-CM | POA: Diagnosis not present

## 2017-05-07 MED ORDER — ATORVASTATIN CALCIUM 40 MG PO TABS: 40.0000 mg | ORAL_TABLET | Freq: Every day | ORAL | 3 refills | Status: DC

## 2017-05-07 MED ORDER — DILTIAZEM HCL ER COATED BEADS 240 MG PO CP24: 240.0000 mg | ORAL_CAPSULE | Freq: Every day | ORAL | 3 refills | Status: DC

## 2017-05-07 MED ORDER — FENOFIBRIC ACID 135 MG PO CPDR: 1.0000 | DELAYED_RELEASE_CAPSULE | Freq: Every day | ORAL | 3 refills | Status: DC

## 2017-05-07 NOTE — Patient Instructions (Signed)
Your physician wants you to follow-up in: ONE YEAR with Dr. Hilty. You will receive a reminder letter in the mail two months in advance. If you don't receive a letter, please call our office to schedule the follow-up appointment.  

## 2017-05-07 NOTE — Progress Notes (Signed)
OFFICE NOTE  Chief Complaint:  Routine follow-up  Primary Care Physician: Sinda Du, MD  HPI:  [name]  is a 61 year old gentleman with a history of hypertension, dyslipidemia, and remote chest pain. In 2003, he had a heart catheterization, which was negative. Fortunately, he quit smoking about a year ago and has gained some weight, but has since lost that. He works for the [employer] in [facility] and is active during the day, but does no specific exercise. He continues to complain of fatigue and now has concerns about erectile dysfunction today, which I will defer to you. He denies any chest pain or worsening shortness of breath. He does report a small amount of heartburn, which is improved with over-the-counter heartburn relief. Apparently, he had pneumonia in March. He does have some swelling in his legs. He seems to be getting over his pneumonia. He denies any chest pain.  I saw [name] back in the office today. He tells me that he has some back pain secondary to a fall and fracture in December. He has some chronic leg edema and shortness of breath which is stable. He denies any chest pain. Recent labs show a total cholesterol 1:30, triglycerides 216, HDL 32 and LDL of 55. The triglycerides are probably elevated related to his elevated glucose of 170 which was fasting. His hemoglobin A1c is also high at 6.7 and will need to be addressed by his primary care provider.  05/07/2017  [name] returns today for follow-up.  He was seen 1 year ago by Ignacia Bayley, NP.  At the time he was doing well and had no new symptoms or complaints.  His medications were unchanged.  Since that time he continues to do well.  He has made some adjustments on his diet.  His weight is down about 5 pounds to 160 pounds.  Blood pressure is well-controlled today 100/56.  I reviewed labs from his primary care provider in September which showed a total cholesterol 116, HDL 26, triglycerides 174 and  LDL-C of 65.  Non-HDL cholesterol 90.  His hemoglobin A1c was 7.1.  Otherwise labs were essentially within normal limits.  Of note his platelet count is low but is followed by the cancer center for this.  He denies any chest pain or worsening shortness of breath.  PMHx:  Past Medical History:  Diagnosis Date  . Anxiety   . Asthma   . Chest pain    a. 2003 Cath: nl cors.  [name] COPD (chronic obstructive pulmonary disease) ([place])   . Depression   . Diabetes mellitus   . Dyslipidemia   . Dysrhythmia   . Fracture 12/15   lower back "L-1"  . GERD (gastroesophageal reflux disease)   . Hypertension   . PONV (postoperative nausea and vomiting)   . Splenomegaly     Past Surgical History:  Procedure Laterality Date  . BIOPSY  07/26/2016   Procedure: BIOPSY;  Surgeon: [name], MD;  [facility];  Service: Endoscopy;;  gastric  . CARDIAC CATHETERIZATION  10/22/2001   normal coronary arteriea (Dr. Domenic Moras)  . COLONOSCOPY  08/31/2011   ATF:TDDUKGUR rectal and colon polyps-treated. Single tubular adenoma. Next TCS 08/2016  . COLONOSCOPY N/A 07/26/2016   Dr. [name]: diverticulosis in sigmoid and descending colon. 6 mm benign splenic flexure. surveillance 5 yeras  . ESOPHAGOGASTRODUODENOSCOPY N/A 07/26/2016   Dr. [name]: normal esophagus, portal gastropathy, chronic gastritis, normal duodenum, screening EGD 2 years   .  ESOPHAGOGASTRODUODENOSCOPY (EGD) WITH ESOPHAGEAL DILATION N/A 05/15/2013   NAT:FTDDUK esophagus-status post Venia Minks dilation/Portal gastropathy. Antral erosions-status post biopsy. extrinsic compression along lesser curvature likely secondary to splenomegaly. gastric bx benign  . HERNIA REPAIR  03/2009  . POLYPECTOMY  07/26/2016   Procedure: POLYPECTOMY;  Surgeon: [name], MD;  [facility];  Service: Endoscopy;;  colon  . TRANSTHORACIC ECHOCARDIOGRAM  2011   EF 02-54%, stage 1 diastolic dysfunction, trace TR & pulm valve regurg   . VASECTOMY  1989     FAMHx:  Family History  Problem Relation Age of Onset  . Heart disease Mother   . Cancer Mother        lymph nodes  . Diabetes Mother   . Arthritis Unknown   . Lung disease Unknown   . Asthma Unknown   . Kidney disease Unknown   . Ovarian cancer Sister   . Diabetes Brother   . Heart disease Brother   . Hypertension Brother   . Diabetes Sister   . Diabetes Sister   . Cirrhosis Sister 67       no etoh  . Colon cancer Neg Hx     SOCHx:   reports that he quit smoking about 5 years ago. He has quit using smokeless tobacco. He reports that he does not drink alcohol or use drugs.  ALLERGIES:  Allergies  Allergen Reactions  . Codeine Rash  . Baclofen     lethargic   . Simvastatin Nausea Only    ROS: Pertinent items noted in HPI and remainder of comprehensive ROS otherwise negative.  HOME MEDS: Current Outpatient Prescriptions  Medication Sig Dispense Refill  . acetaminophen (TYLENOL) 500 MG tablet Take 1,000 mg by mouth daily as needed for headache.    . ALPRAZolam (XANAX) 0.25 MG tablet Take 0.25 mg by mouth 2 (two) times daily.     . Artificial Tear Ointment (REFRESH P.M. OP) Apply 1 application to eye daily as needed (dry eye). As directed     . aspirin EC 81 MG tablet Take 81 mg by mouth at bedtime.     [name] atorvastatin (LIPITOR) 40 MG tablet Take 1 tablet (40 mg total) by mouth daily. 90 tablet 3  . Choline Fenofibrate (FENOFIBRIC ACID) 135 MG CPDR Take 1 capsule by mouth daily. 90 capsule 3  . clotrimazole-betamethasone (LOTRISONE) cream Apply 1 application topically 2 (two) times daily as needed (psoriasis).    [name] diltiazem (CARDIZEM CD) 240 MG 24 hr capsule Take 1 capsule (240 mg total) by mouth daily. 90 capsule 3  . fluticasone (FLONASE) 50 MCG/ACT nasal spray Place 2 sprays into both nostrils daily as needed for allergies.     [name] HYDROcodone-acetaminophen (NORCO/VICODIN) 5-325 MG per tablet Take 1 tablet by mouth every 8 (eight) hours as needed for moderate pain.  (Patient taking differently: Take 1 tablet by mouth daily as needed for moderate pain. ) 90 tablet 0  . JARDIANCE 25 MG TABS tablet Take 25 mg by mouth every evening.   3  . Multiple Vitamin (MULITIVITAMIN WITH MINERALS) TABS Take 1 tablet by mouth at bedtime.     [name] neomycin-bacitracin-polymyxin (NEOSPORIN) ointment Apply 1 application topically daily as needed for wound care. apply to eye    . pantoprazole (PROTONIX) 40 MG tablet TAKE 1 TABLET (40 MG TOTAL) BY MOUTH DAILY. 90 tablet 2  . PROAIR HFA 108 (90 Base) MCG/ACT inhaler Inhale 2 puffs into the lungs every 4 (four) hours as needed for wheezing or  shortness of breath.   12  . SitaGLIPtin-MetFORMIN HCl (JANUMET XR) [phone] MG TB24 Take 1 tablet by mouth daily.    . tamsulosin (FLOMAX) 0.4 MG CAPS capsule Take 0.4 mg by mouth daily.    [name] tiotropium (SPIRIVA) 18 MCG inhalation capsule Place 18 mcg into inhaler and inhale daily.     No current facility-administered medications for this visit.     LABS/IMAGING: No results found for this or any previous visit (from the past 48 hour(s)). No results found.  VITALS: BP (!) 100/56   Pulse 75   Ht 5' 7"  (1.702 m)   Wt 160 lb (72.6 kg)   BMI 25.06 kg/m   EXAM: General appearance: alert and no distress Neck: no carotid bruit and no JVD Lungs: clear to auscultation bilaterally Heart: regular rate and rhythm, S1, S2 normal, no murmur, click, rub or gallop Abdomen: soft, non-tender; bowel sounds normal; no masses,  no organomegaly Extremities: extremities normal, atraumatic, no cyanosis or edema Pulses: 2+ and symmetric Skin: Skin color, texture, turgor normal. No rashes or lesions Neurologic: Grossly normal Psych: Mood, affect normal  EKG: Normal sinus rhythm at 75-personally reviewed  ASSESSMENT: 1. Hypertension-controlled 2. Elevated triglycerides secondary to hyperglycemia 3. Diabetes type 2 - uncontrolled with A1c of 6.7  PLAN: 1.   [name] is doing well from a cardiac  standpoint. His blood pressure is well controlled.  Cholesterol is at goal with LDL less than 70 and non-HDL less than 100.  His hemoglobin A1c is 7.1 which is reasonably well controlled.  His weight is also within normal limits.  Overall [name] is doing well we will plan to see him back annually or sooner as necessary.  We refilled some medicines today but otherwise made no changes.  Follow-up in 1 year.  Pixie Casino, MD, [place] Director of the [facility] Attending Cardiologist  Direct Dial: [phone]  Fax: [phone]  Website:  [email or web address] 05/07/2017, 1:34 PM

## 2017-07-12 DIAGNOSIS — E1165 Type 2 diabetes mellitus with hyperglycemia: Secondary | ICD-10-CM | POA: Diagnosis not present

## 2017-07-12 DIAGNOSIS — I10 Essential (primary) hypertension: Secondary | ICD-10-CM | POA: Diagnosis not present

## 2017-07-12 DIAGNOSIS — M545 Low back pain: Secondary | ICD-10-CM | POA: Diagnosis not present

## 2017-07-12 DIAGNOSIS — J449 Chronic obstructive pulmonary disease, unspecified: Secondary | ICD-10-CM | POA: Diagnosis not present

## 2017-07-18 DIAGNOSIS — Z79891 Long term (current) use of opiate analgesic: Secondary | ICD-10-CM | POA: Diagnosis not present

## 2017-07-18 DIAGNOSIS — I10 Essential (primary) hypertension: Secondary | ICD-10-CM | POA: Diagnosis not present

## 2017-07-18 DIAGNOSIS — E1140 Type 2 diabetes mellitus with diabetic neuropathy, unspecified: Secondary | ICD-10-CM | POA: Diagnosis not present

## 2017-07-18 DIAGNOSIS — N401 Enlarged prostate with lower urinary tract symptoms: Secondary | ICD-10-CM | POA: Diagnosis not present

## 2017-07-18 DIAGNOSIS — J449 Chronic obstructive pulmonary disease, unspecified: Secondary | ICD-10-CM | POA: Diagnosis not present

## 2017-07-26 ENCOUNTER — Other Ambulatory Visit: Payer: Self-pay | Admitting: *Deleted

## 2017-07-26 ENCOUNTER — Other Ambulatory Visit: Payer: Self-pay | Admitting: Acute Care

## 2017-07-26 DIAGNOSIS — Z87891 Personal history of nicotine dependence: Secondary | ICD-10-CM

## 2017-07-26 DIAGNOSIS — Z122 Encounter for screening for malignant neoplasm of respiratory organs: Secondary | ICD-10-CM

## 2017-07-31 ENCOUNTER — Other Ambulatory Visit: Payer: Self-pay

## 2017-07-31 MED ORDER — PANTOPRAZOLE SODIUM 40 MG PO TBEC: 40.0000 mg | DELAYED_RELEASE_TABLET | Freq: Every day | ORAL | 3 refills | Status: DC

## 2017-08-05 ENCOUNTER — Encounter: Payer: Self-pay | Admitting: *Deleted

## 2017-08-05 ENCOUNTER — Ambulatory Visit: Payer: PPO | Admitting: Gastroenterology

## 2017-08-05 ENCOUNTER — Encounter: Payer: Self-pay | Admitting: Gastroenterology

## 2017-08-05 VITALS — BP 116/66 | HR 104 | Temp 97.2°F | Ht 67.0 in | Wt 165.6 lb

## 2017-08-05 DIAGNOSIS — K219 Gastro-esophageal reflux disease without esophagitis: Secondary | ICD-10-CM

## 2017-08-05 DIAGNOSIS — K7581 Nonalcoholic steatohepatitis (NASH): Secondary | ICD-10-CM

## 2017-08-05 DIAGNOSIS — Z86010 Personal history of colonic polyps: Secondary | ICD-10-CM

## 2017-08-05 NOTE — Progress Notes (Signed)
Referring Provider: Sinda Du, MD Primary Care Physician:  Sinda Du, MD Primary GI: Dr. [name]    Chief Complaint  Patient presents with  . Gastroesophageal Reflux    f/u, doing ok  . liver fibrosis    HPI:   [name] is a 62 y.o. male presenting today with a history of GERD, splenomegaly, portal gastropathy. Chronic thrombocytopenia, ultrasound with elastography in 2015 with F score of 0 now progressed to F2./F3. Family history of cirrhosis (sister) early age. Prior evaluation with negative Hep C and Hep B negative, negative hemochromatosis. Further extensive serologies completed last year with mildly elevated ASMA. Liver biopsy completed with NASH fibrosis. US abdomen due now.   Recent EGD/TCS in Jan 2018 with portal gastropathy, chronic gastritis. EGD in 2 years. Diverticulosis in colon, benign polyp, surveillance in 5 years. LFTs normal in Sept 2018.   Needs Hep A/B vaccination: unable to get done with PCP. Coughed up blood recently. States he has a CT chest 2/1. Has dyspnea on exertion, feels like it is worsening. No rectal bleeding. No constipation or diarrhea. Protonix once a day for GERD. No dysphagia. GERD is controlled as long as he is on Protonix.   Past Medical History:  Diagnosis Date  . Anxiety   . Asthma   . Chest pain    a. 2003 Cath: nl cors.  [name] COPD (chronic obstructive pulmonary disease) ([place])   . Depression   . Diabetes mellitus   . Dyslipidemia   . Dysrhythmia   . Fracture 12/15   lower back "L-1"  . GERD (gastroesophageal reflux disease)   . Hypertension   . PONV (postoperative nausea and vomiting)   . Splenomegaly     Past Surgical History:  Procedure Laterality Date  . BIOPSY  07/26/2016   Procedure: BIOPSY;  Surgeon: [name], MD;  [facility];  Service: Endoscopy;;  gastric  . CARDIAC CATHETERIZATION  10/22/2001   normal coronary arteriea (Dr. Domenic Moras)  . COLONOSCOPY  08/31/2011   MVH:[id number] rectal and  colon polyps-treated. Single tubular adenoma. Next TCS 08/2016  . COLONOSCOPY N/A 07/26/2016   Dr. [name]: diverticulosis in sigmoid and descending colon. 6 mm benign splenic flexure. surveillance 5 yeras  . ESOPHAGOGASTRODUODENOSCOPY N/A 07/26/2016   Dr. [name]: normal esophagus, portal gastropathy, chronic gastritis, normal duodenum, screening EGD 2 years   . ESOPHAGOGASTRODUODENOSCOPY (EGD) WITH ESOPHAGEAL DILATION N/A 05/15/2013   MWU:[id number] esophagus-status post Venia Minks dilation/Portal gastropathy. Antral erosions-status post biopsy. extrinsic compression along lesser curvature likely secondary to splenomegaly. gastric bx benign  . HERNIA REPAIR  03/2009  . POLYPECTOMY  07/26/2016   Procedure: POLYPECTOMY;  Surgeon: [name], MD;  [facility];  Service: Endoscopy;;  colon  . TRANSTHORACIC ECHOCARDIOGRAM  2011   EF 10-27%, stage 1 diastolic dysfunction, trace TR & pulm valve regurg   . VASECTOMY  1989    Current Outpatient Medications  Medication Sig Dispense Refill  . acetaminophen (TYLENOL) 500 MG tablet Take 1,000 mg by mouth daily as needed for headache.    . ALPRAZolam (XANAX) 0.25 MG tablet Take 0.25 mg by mouth 2 (two) times daily.     . Artificial Tear Ointment (REFRESH P.M. OP) Apply 1 application to eye daily as needed (dry eye). As directed     . aspirin EC 81 MG tablet Take 81 mg by mouth at bedtime.     [name] atorvastatin (LIPITOR) 40 MG tablet Take 1 tablet (40 mg total) by mouth  daily. 90 tablet 3  . Choline Fenofibrate (FENOFIBRIC ACID) 135 MG CPDR Take 1 capsule by mouth daily. 90 capsule 3  . clotrimazole-betamethasone (LOTRISONE) cream Apply 1 application topically 2 (two) times daily as needed (psoriasis).    [name] diltiazem (CARDIZEM CD) 240 MG 24 hr capsule Take 1 capsule (240 mg total) by mouth daily. 90 capsule 3  . fluticasone (FLONASE) 50 MCG/ACT nasal spray Place 2 sprays into both nostrils daily as needed for allergies.     [name] gabapentin (NEURONTIN) 300 MG  capsule Take 1 capsule by mouth at bedtime.  5  . HYDROcodone-acetaminophen (NORCO/VICODIN) 5-325 MG per tablet Take 1 tablet by mouth every 8 (eight) hours as needed for moderate pain. (Patient taking differently: Take 1 tablet by mouth daily as needed for moderate pain. ) 90 tablet 0  . JARDIANCE 25 MG TABS tablet Take 25 mg by mouth every evening.   3  . Multiple Vitamin (MULITIVITAMIN WITH MINERALS) TABS Take 1 tablet by mouth at bedtime.     [name] neomycin-bacitracin-polymyxin (NEOSPORIN) ointment Apply 1 application topically daily as needed for wound care. apply to eye    . pantoprazole (PROTONIX) 40 MG tablet Take 1 tablet (40 mg total) by mouth daily. 90 tablet 3  . PROAIR HFA 108 (90 Base) MCG/ACT inhaler Inhale 2 puffs into the lungs every 4 (four) hours as needed for wheezing or shortness of breath.   12  . SitaGLIPtin-MetFORMIN HCl (JANUMET XR) [phone] MG TB24 Take 1 tablet by mouth daily.    . tamsulosin (FLOMAX) 0.4 MG CAPS capsule Take 0.4 mg by mouth daily.    [name] tiotropium (SPIRIVA) 18 MCG inhalation capsule Place 18 mcg into inhaler and inhale daily.     No current facility-administered medications for this visit.     Allergies as of 08/05/2017 - Review Complete 08/05/2017  Allergen Reaction Noted  . Codeine Rash 07/24/2011  . Baclofen  07/24/2016  . Simvastatin Nausea Only 10/05/2013    Family History  Problem Relation Age of Onset  . Heart disease Mother   . Cancer Mother        lymph nodes  . Diabetes Mother   . Arthritis Unknown   . Lung disease Unknown   . Asthma Unknown   . Kidney disease Unknown   . Ovarian cancer Sister   . Diabetes Brother   . Heart disease Brother   . Hypertension Brother   . Diabetes Sister   . Diabetes Sister   . Cirrhosis Sister 21       no etoh  . Colon cancer Neg Hx     Social History   Socioeconomic History  . Marital status: Married    Spouse name: None  . Number of children: None  . Years of education: None  . Highest  education level: None  Social Needs  . Financial resource strain: None  . Food insecurity - worry: None  . Food insecurity - inability: None  . Transportation needs - medical: None  . Transportation needs - non-medical: None  Occupational History  . None  Tobacco Use  . Smoking status: Former Smoker    Last attempt to quit: 10/06/2011    Years since quitting: 5.8  . Smokeless tobacco: Former Systems developer  . Tobacco comment: quit x 2 years  Substance and Sexual Activity  . Alcohol use: No  . Drug use: No  . Sexual activity: None  Other Topics Concern  . None  Social History Narrative  . None  Review of Systems: Gen: Denies fever, chills, anorexia. Denies fatigue, weakness, weight loss.  CV: Denies chest pain, palpitations, syncope, peripheral edema, and claudication. Resp: see HPI  GI: see HPI  Derm: Denies rash, itching, dry skin Psych: Denies depression, anxiety, memory loss, confusion. No homicidal or suicidal ideation.  Heme: Denies bruising, bleeding, and enlarged lymph nodes.  Physical Exam: BP 116/66   Pulse (!) 104   Temp (!) 97.2 F (36.2 C) (Oral)   Ht 5' 7"  (1.702 m)   Wt 165 lb 9.6 oz (75.1 kg)   BMI 25.94 kg/m  General:   Alert and oriented. No distress noted. Pleasant and cooperative.  Head:  Normocephalic and atraumatic. Eyes:  Conjuctiva clear without scleral icterus. Mouth:  Oral mucosa pink and moist.  Abdomen:  +BS, soft, non-tender and non-distended. No rebound or guarding. Msk:  Symmetrical without gross deformities. Normal posture. Extremities:  Without edema. Neurologic:  Alert and  oriented x4 Psych:  Alert and cooperative. Normal mood and affect.

## 2017-08-05 NOTE — Patient Instructions (Addendum)
We are scheduling you for a routine ultrasound of your liver.   We recommend getting the Hepatitis A and B vaccinations.   We will see you in 6 months!  Nonalcoholic Fatty Liver Disease Diet Nonalcoholic fatty liver disease is a condition that causes fat to accumulate in and around the liver. The disease makes it harder for the liver to work the way that it should. Following a healthy diet can help to keep nonalcoholic fatty liver disease under control. It can also help to prevent or improve conditions that are associated with the disease, such as heart disease, diabetes, high blood pressure, and abnormal cholesterol levels. Along with regular exercise, this diet:  Promotes weight loss.  Helps to control blood sugar levels.  Helps to improve the way that the body uses insulin.  What do I need to know about this diet?  Use the glycemic index (GI) to plan your meals. The index tells you how quickly a food will raise your blood sugar. Choose low-GI foods. These foods take a longer time to raise blood sugar.  Keep track of how many calories you take in. Eating the right amount of calories will help you to achieve a healthy weight.  You may want to follow a Mediterranean diet. This diet includes a lot of vegetables, lean meats or fish, whole grains, fruits, and healthy oils and fats. What foods can I eat? Grains Whole grains, such as whole-wheat or whole-grain breads, crackers, tortillas, cereals, and pasta. Stone-ground whole wheat. Pumpernickel bread. Unsweetened oatmeal. Bulgur. Barley. Quinoa. Brown or wild rice. Corn or whole-wheat flour tortillas. Vegetables Lettuce. Spinach. Peas. Beets. Cauliflower. Cabbage. Broccoli. Carrots. Tomatoes. Squash. Eggplant. Herbs. Peppers. Onions. Cucumbers. Brussels sprouts. Yams and sweet potatoes. Beans. Lentils. Fruits Bananas. Apples. Oranges. Grapes. Papaya. Mango. Pomegranate. Kiwi. Grapefruit. Cherries. Meats and Other Protein Sources Seafood  and shellfish. Lean meats. Poultry. Tofu. Dairy Low-fat or fat-free dairy products, such as yogurt, cottage cheese, and cheese. Beverages Water. Sugar-free drinks. Tea. Coffee. Low-fat or skim milk. Milk alternatives, such as soy or almond milk. Real fruit juice. Condiments Mustard. Relish. Low-fat, low-sugar ketchup and barbecue sauce. Low-fat or fat-free mayonnaise. Sweets and Desserts Sugar-free sweets. Fats and Oils Avocado. Canola or olive oil. Nuts and nut butters. Seeds. The items listed above may not be a complete list of recommended foods or beverages. Contact your dietitian for more options. What foods are not recommended? Palm oil and coconut oil. Processed foods. Fried foods. Sweetened drinks, such as sweet tea, milkshakes, snow cones, iced sweet drinks, and sodas. Alcohol. Sweets. Foods that contain a lot of salt or sodium. The items listed above may not be a complete list of foods and beverages to avoid. Contact your dietitian for more information. This information is not intended to replace advice given to you by your health care provider. Make sure you discuss any questions you have with your health care provider. Document Released: 11/09/2014 Document Revised: 12/01/2015 Document Reviewed: 07/20/2014 Elsevier Interactive Patient Education  [employer].

## 2017-08-05 NOTE — Assessment & Plan Note (Signed)
Due for surveillance in Jan 2023.

## 2017-08-05 NOTE — Progress Notes (Signed)
CC'ED TO PCP 

## 2017-08-05 NOTE — Assessment & Plan Note (Addendum)
62 year old very pleasant male with biopsy proven NASH fibrosis. Due for routine US abdomen now (prior elastography F2/F3). Recent LFTs normal, and he has upcoming labs with Hematology in the next few weeks. Needs Hep A/B vaccination. Discussed dietary and behavior modifications. EGD due in Jan 2020 for surveillance. Return in 6 months. Stable from a GI standpoint.

## 2017-08-05 NOTE — Assessment & Plan Note (Signed)
Continue Protonix once daily.

## 2017-08-09 ENCOUNTER — Ambulatory Visit (INDEPENDENT_AMBULATORY_CARE_PROVIDER_SITE_OTHER)
Admission: RE | Admit: 2017-08-09 | Discharge: 2017-08-09 | Disposition: A | Discharge status: Home / Self Care | Payer: PPO | Source: Ambulatory Visit | Attending: Acute Care | Admitting: Acute Care

## 2017-08-09 ENCOUNTER — Encounter: Payer: Self-pay | Admitting: Acute Care

## 2017-08-09 ENCOUNTER — Ambulatory Visit (INDEPENDENT_AMBULATORY_CARE_PROVIDER_SITE_OTHER): Payer: PPO | Admitting: Acute Care

## 2017-08-09 DIAGNOSIS — Z122 Encounter for screening for malignant neoplasm of respiratory organs: Secondary | ICD-10-CM

## 2017-08-09 DIAGNOSIS — Z87891 Personal history of nicotine dependence: Secondary | ICD-10-CM

## 2017-08-09 NOTE — Progress Notes (Signed)
Shared Decision Making Visit Lung Cancer Screening Program [phone])   Eligibility:  Age 62 y.o.  Pack Years Smoking History Calculation 45 pack year smoking history (# packs/per year x # years smoked)  Recent History of coughing up blood  no  Unexplained weight loss? no ( >Than 15 pounds within the last 6 months )  Prior History Lung / other cancer no (Diagnosis within the last 5 years already requiring surveillance chest CT Scans).  Smoking Status Former Smoker  Former Smokers: Years since quit: 6 years  Quit Date: 04/2011  Visit Components:  Discussion included one or more decision making aids. yes  Discussion included risk/benefits of screening. yes  Discussion included potential follow up diagnostic testing for abnormal scans. yes  Discussion included meaning and risk of over diagnosis. yes  Discussion included meaning and risk of False Positives. yes  Discussion included meaning of total radiation exposure. yes  Counseling Included:  Importance of adherence to annual lung cancer LDCT screening. yes  Impact of comorbidities on ability to participate in the program. yes  Ability and willingness to under diagnostic treatment. yes  Smoking Cessation Counseling:  Current Smokers:   Discussed importance of smoking cessation. {NA  Information about tobacco cessation classes and interventions provided to patient. yes  Patient provided with "ticket" for LDCT Scan. yes  Symptomatic Patient. no  Counseling  Diagnosis Code: Tobacco Use Z72.0  Asymptomatic Patient yes  Counseling (Intermediate counseling: > three minutes counseling) G1829  Former Smokers:   Discussed the importance of maintaining cigarette abstinence. yes  Diagnosis Code: Personal History of Nicotine Dependence. H37.169  Information about tobacco cessation classes and interventions provided to patient. Yes  Patient provided with "ticket" for LDCT Scan. yes  Written Order for Lung  Cancer Screening with LDCT placed in Epic. Yes (CT Chest Lung Cancer Screening Low Dose W/O CM) [id number] Z12.2-Screening of respiratory organs Z87.891-Personal history of nicotine dependence  I spent 25 minutes of face to face time with Mr. Karwowski and his wife discussing the risks and benefits of lung cancer screening. We viewed a power point together that explained in detail the above noted topics. We took the time to pause the power point at intervals to allow for questions to be asked and answered to ensure understanding. We discussed that he had taken the single most powerful action possible to decrease his risk of developing lung cancer when he quit smoking. I counseled him to remain smoke free, and to contact me if he ever had the desire to smoke again so that I can provide resources and tools to help support the effort to remain smoke free. We discussed the time and location of the scan, and that either  Doroteo Glassman RN or I will call with the results within  24-48 hours of receiving them. He has my card and contact information in the event he needs to speak with me, in addition to a copy of the power point we reviewed as a resource. Mr. Gauss  verbalized understanding of all of the above and had no further questions upon leaving the office.     I explained to the patient that there has been a high incidence of coronary artery disease noted on these exams. I explained that this is a non-gated exam therefore degree or severity cannot be determined. This patient is currently on statin therapy. I have asked the patient to follow-up with their PCP regarding any incidental finding of coronary artery disease and management with diet or medication  as they feel is clinically indicated. The patient verbalized understanding of the above and had no further questions.     Magdalen Spatz, NP 08/09/2017

## 2017-08-16 ENCOUNTER — Telehealth (INDEPENDENT_AMBULATORY_CARE_PROVIDER_SITE_OTHER): Payer: Self-pay | Admitting: Acute Care

## 2017-08-16 ENCOUNTER — Other Ambulatory Visit: Payer: Self-pay | Admitting: Acute Care

## 2017-08-16 DIAGNOSIS — Z122 Encounter for screening for malignant neoplasm of respiratory organs: Secondary | ICD-10-CM

## 2017-08-16 DIAGNOSIS — Z87891 Personal history of nicotine dependence: Secondary | ICD-10-CM

## 2017-08-16 NOTE — Telephone Encounter (Signed)
I have attempted to call [name] with the results of his low-dose screening CT again today.  There was no answer at the only number listed as a contact.  I have left a message requesting that the patient call the office for results of his scan.  I left contact information on his voicemail. Denise please send a letter requesting that the patient call the office.  Please place order for 50-monthfollow-up CT scan.  Thank you

## 2017-08-19 ENCOUNTER — Encounter: Payer: Self-pay | Admitting: *Deleted

## 2017-08-19 DIAGNOSIS — N401 Enlarged prostate with lower urinary tract symptoms: Secondary | ICD-10-CM | POA: Diagnosis not present

## 2017-08-19 DIAGNOSIS — I10 Essential (primary) hypertension: Secondary | ICD-10-CM | POA: Diagnosis not present

## 2017-08-19 DIAGNOSIS — J441 Chronic obstructive pulmonary disease with (acute) exacerbation: Secondary | ICD-10-CM | POA: Diagnosis not present

## 2017-08-19 DIAGNOSIS — E1165 Type 2 diabetes mellitus with hyperglycemia: Secondary | ICD-10-CM | POA: Diagnosis not present

## 2017-08-19 NOTE — Telephone Encounter (Signed)
Letter mailed to pt to have him contact our office to discuss chest ct results.  Order was placed for 3 mth f/u Ct.

## 2017-08-20 ENCOUNTER — Inpatient Hospital Stay (HOSPITAL_COMMUNITY): Payer: PPO

## 2017-08-20 ENCOUNTER — Ambulatory Visit (HOSPITAL_COMMUNITY)
Admission: RE | Admit: 2017-08-20 | Discharge: 2017-08-20 | Disposition: A | Discharge status: Home / Self Care | Payer: PPO | Source: Ambulatory Visit | Attending: Gastroenterology | Admitting: Gastroenterology

## 2017-08-20 ENCOUNTER — Other Ambulatory Visit: Payer: Self-pay

## 2017-08-20 ENCOUNTER — Inpatient Hospital Stay (HOSPITAL_COMMUNITY): Payer: PPO | Attending: Internal Medicine | Admitting: Internal Medicine

## 2017-08-20 ENCOUNTER — Encounter (HOSPITAL_COMMUNITY): Payer: Self-pay | Admitting: Internal Medicine

## 2017-08-20 VITALS — BP 112/52 | HR 91 | Temp 97.8°F | Resp 20 | Wt 160.9 lb

## 2017-08-20 DIAGNOSIS — E785 Hyperlipidemia, unspecified: Secondary | ICD-10-CM | POA: Diagnosis not present

## 2017-08-20 DIAGNOSIS — I10 Essential (primary) hypertension: Secondary | ICD-10-CM | POA: Insufficient documentation

## 2017-08-20 DIAGNOSIS — J449 Chronic obstructive pulmonary disease, unspecified: Secondary | ICD-10-CM | POA: Insufficient documentation

## 2017-08-20 DIAGNOSIS — D72819 Decreased white blood cell count, unspecified: Secondary | ICD-10-CM | POA: Diagnosis not present

## 2017-08-20 DIAGNOSIS — M1990 Unspecified osteoarthritis, unspecified site: Secondary | ICD-10-CM | POA: Insufficient documentation

## 2017-08-20 DIAGNOSIS — R945 Abnormal results of liver function studies: Secondary | ICD-10-CM

## 2017-08-20 DIAGNOSIS — K219 Gastro-esophageal reflux disease without esophagitis: Secondary | ICD-10-CM | POA: Insufficient documentation

## 2017-08-20 DIAGNOSIS — R161 Splenomegaly, not elsewhere classified: Secondary | ICD-10-CM | POA: Diagnosis not present

## 2017-08-20 DIAGNOSIS — Z885 Allergy status to narcotic agent status: Secondary | ICD-10-CM

## 2017-08-20 DIAGNOSIS — Z7982 Long term (current) use of aspirin: Secondary | ICD-10-CM | POA: Diagnosis not present

## 2017-08-20 DIAGNOSIS — R911 Solitary pulmonary nodule: Secondary | ICD-10-CM | POA: Diagnosis not present

## 2017-08-20 DIAGNOSIS — K760 Fatty (change of) liver, not elsewhere classified: Secondary | ICD-10-CM

## 2017-08-20 DIAGNOSIS — E119 Type 2 diabetes mellitus without complications: Secondary | ICD-10-CM | POA: Insufficient documentation

## 2017-08-20 DIAGNOSIS — Z79899 Other long term (current) drug therapy: Secondary | ICD-10-CM | POA: Diagnosis not present

## 2017-08-20 DIAGNOSIS — K7581 Nonalcoholic steatohepatitis (NASH): Secondary | ICD-10-CM | POA: Diagnosis not present

## 2017-08-20 DIAGNOSIS — M5136 Other intervertebral disc degeneration, lumbar region: Secondary | ICD-10-CM | POA: Diagnosis not present

## 2017-08-20 DIAGNOSIS — D696 Thrombocytopenia, unspecified: Secondary | ICD-10-CM

## 2017-08-20 DIAGNOSIS — Z8041 Family history of malignant neoplasm of ovary: Secondary | ICD-10-CM | POA: Insufficient documentation

## 2017-08-20 DIAGNOSIS — R7989 Other specified abnormal findings of blood chemistry: Secondary | ICD-10-CM

## 2017-08-20 DIAGNOSIS — Z87891 Personal history of nicotine dependence: Secondary | ICD-10-CM | POA: Diagnosis not present

## 2017-08-20 LAB — COMPREHENSIVE METABOLIC PANEL
ALK PHOS: 58 U/L (ref 38–126)
ALT: 50 U/L (ref 17–63)
ANION GAP: 13 (ref 5–15)
AST: 35 U/L (ref 15–41)
Albumin: 4.3 g/dL (ref 3.5–5.0)
BUN: 27 mg/dL — ABNORMAL HIGH (ref 6–20)
CALCIUM: 9.7 mg/dL (ref 8.9–10.3)
CO2: 23 mmol/L (ref 22–32)
Chloride: 101 mmol/L (ref 101–111)
Creatinine, Ser: 1.29 mg/dL — ABNORMAL HIGH (ref 0.61–1.24)
GFR, EST NON AFRICAN AMERICAN: 58 mL/min — AB (ref >60)
Glucose, Bld: 223 mg/dL — ABNORMAL HIGH (ref 65–99)
Potassium: 4.4 mmol/L (ref 3.5–5.1)
Sodium: 137 mmol/L (ref 135–145)
Total Bilirubin: 0.7 mg/dL (ref 0.3–1.2)
Total Protein: 9.3 g/dL — ABNORMAL HIGH (ref 6.5–8.1)

## 2017-08-20 LAB — CBC WITH DIFFERENTIAL/PLATELET
Basophils Absolute: 0 10*3/uL (ref 0.0–0.1)
Basophils Relative: 0 %
EOS PCT: 0 %
Eosinophils Absolute: 0 10*3/uL (ref 0.0–0.7)
HCT: 50 % (ref 39.0–52.0)
Hemoglobin: 15.9 g/dL (ref 13.0–17.0)
LYMPHS ABS: 0.7 10*3/uL (ref 0.7–4.0)
LYMPHS PCT: 18 %
MCH: 26.3 pg (ref 26.0–34.0)
MCHC: 31.8 g/dL (ref 30.0–36.0)
MCV: 82.8 fL (ref 78.0–100.0)
MONOS PCT: 6 %
Monocytes Absolute: 0.2 10*3/uL (ref 0.1–1.0)
Neutro Abs: 2.8 10*3/uL (ref 1.7–7.7)
Neutrophils Relative %: 76 %
PLATELETS: 97 10*3/uL — AB (ref 150–400)
RBC: 6.04 MIL/uL — AB (ref 4.22–5.81)
RDW: 14.4 % (ref 11.5–15.5)
WBC: 3.6 10*3/uL — AB (ref 4.0–10.5)

## 2017-08-20 NOTE — Patient Instructions (Addendum)
[place] at [facility] Discharge Instructions  RECOMMENDATIONS MADE BY THE CONSULTANT AND ANY TEST RESULTS WILL BE SENT TO YOUR REFERRING PHYSICIAN.  You were seen today by Dr. Zoila Shutter Your lab work looked good, it is better than it was previously.   Based on the results of your CT scan we will schedule you for a PET scan. Follow up after PET for results.   Thank you for choosing [place] at [facility] to provide your oncology and hematology care.  To afford each patient quality time with our provider, please arrive at least 15 minutes before your scheduled appointment time.    If you have a lab appointment with the [facility] please come in thru the  Main Entrance and check in at the main information desk  You need to re-schedule your appointment should you arrive 10 or more minutes late.  We strive to give you quality time with our providers, and arriving late affects you and other patients whose appointments are after yours.  Also, if you no show three or more times for appointments you may be dismissed from the clinic at the providers discretion.     Again, thank you for choosing [facility].  Our hope is that these requests will decrease the amount of time that you wait before being seen by our physicians.       _____________________________________________________________  Should you have questions after your visit to [facility], please contact our office at [phone] between the hours of 8:30 a.m. and 4:30 p.m.  Voicemails left after 4:30 p.m. will not be returned until the following business day.  For prescription refill requests, have your pharmacy contact our office.       Resources For Cancer Patients and their Caregivers ? American Cancer Society: Can assist with transportation, wigs, general needs, runs Look Good Feel Better.        3251375264 ? Cancer Care: Provides financial  assistance, online support groups, medication/co-pay assistance.  1-800-813-HOPE 727-567-6873) ? [place] Assists [place] cancer patients and their families through emotional , educational and financial support.  203-229-9892 ? [place] DSS Where to apply for food stamps, Medicaid and utility assistance. 580-709-0578 ? RCATS: Transportation to medical appointments. (385) 423-3112 ? Social Security Administration: May apply for disability if have a Stage IV cancer. 5622061040 [phone] ? [employer], Disability and Transit Services: Assists with nutrition, care and transit needs. [place] Support Programs: @10RELATIVEDAYS @ > Cancer Support Group  2nd Tuesday of the month 1pm-2pm, Journey Room  > Creative Journey  3rd Tuesday of the month 1130am-1pm, Journey Room  > Look Good Feel Better  1st Wednesday of the month 10am-12 noon, Journey Room (Call [place] to register [phone])

## 2017-08-26 ENCOUNTER — Encounter (HOSPITAL_COMMUNITY)
Admission: RE | Admit: 2017-08-26 | Discharge: 2017-08-26 | Disposition: A | Discharge status: Home / Self Care | Payer: PPO | Source: Ambulatory Visit | Attending: Internal Medicine | Admitting: Internal Medicine

## 2017-08-26 DIAGNOSIS — R911 Solitary pulmonary nodule: Secondary | ICD-10-CM | POA: Diagnosis not present

## 2017-08-26 MED ORDER — FLUDEOXYGLUCOSE F - 18 (FDG) INJECTION
10.0000 | Freq: Once | INTRAVENOUS | Status: AC
  Administered 2017-08-26: 10.46 via INTRAVENOUS

## 2017-08-26 NOTE — Progress Notes (Signed)
Diagnosis Pulmonary nodule - Plan: NM PET Image Initial (PI) Skull Base To Thigh, NM PET Image Initial (PI) Skull Base To Thigh  Leukopenia, unspecified type  Non-alcoholic fatty liver disease - Plan: NM PET Image Initial (PI) Skull Base To Thigh, CBC with Differential/Platelet, Comprehensive metabolic panel, Lactate dehydrogenase, Protime-INR, APTT  Staging Cancer Staging No matching staging information was found for the patient.  Assessment and Plan:  1.  Thrombocytopenia ([place]):  Labs done 08/20/2017 shows plts 97,000.  He has reported history of splenomegaly and fatty liver.  Will repeat labs in 3-4 weeks.    2.  Pulmonary nodule.  This was noted on CT chest done for lung cancer screening.  He will be set up for PET scan for followup and interval evaluation.  He will RTC to go over results.    3. Leukopenia.  WBC stable at 3.6.  Will repeat labs in 3-4 weeks.    4.  Fatty liver.  LFTs WNL on labs done 08/20/2017.    CURRENT THERAPY: Observation  INTERVAL HISTORY: [name] 62 y.o. male with mild thrombocytopenia and leukopenia in the setting of splenomegaly with fatty infiltration of liver.  Current Status:  Pt is seen today for followup.  He is here to go over CT scan and labs.    Problem List Patient Active Problem List   Diagnosis Date Noted  . NASH (nonalcoholic steatohepatitis) [K75.81] 08/05/2017  . Liver fibrosis [K74.0] 01/31/2017  . Hx of adenomatous colonic polyps [Z86.010] 07/05/2016  . Hyperproteinemia [E88.09] 09/05/2014  . HTN (hypertension) [I10] 10/14/2013  . Dyslipidemia [E78.5] 10/14/2013  . Type 2 diabetes mellitus without complication, without long-term current use of insulin ([place]) [E11.9] 10/14/2013  . Leukopenia [D72.819] 08/08/2013  . Thrombocytopenia ([place]) [D69.6] 08/08/2013  . Lower abdominal pain [R10.30] 07/06/2013  . Abnormal LFTs [R94.5] 07/06/2013  . GERD (gastroesophageal reflux disease) [K21.9] 07/06/2013  . Unspecified constipation  [K59.00] 07/06/2013  . Abdominal bloating [R14.0] 04/21/2013  . Abdominal pain, generalized [R10.84] 04/21/2013  . Non-alcoholic fatty liver disease [K76.0] 04/21/2013  . Splenomegaly [R16.1] 04/21/2013  . Lumbago [M54.5] 11/26/2011  . Muscle weakness (generalized) [M62.81] 11/26/2011  . Bilateral leg pain [M79.604, M79.605] 11/06/2011  . Back pain with radiation [M54.9] 11/06/2011  . DDD (degenerative disc disease), lumbar [M51.36] 11/06/2011    Past Medical History Past Medical History:  Diagnosis Date  . Anxiety   . Asthma   . Chest pain    a. 2003 Cath: nl cors.  [name] COPD (chronic obstructive pulmonary disease) ([place])   . Depression   . Diabetes mellitus   . Dyslipidemia   . Dysrhythmia   . Fracture 12/15   lower back "L-1"  . GERD (gastroesophageal reflux disease)   . Hypertension   . PONV (postoperative nausea and vomiting)   . Splenomegaly     Past Surgical History Past Surgical History:  Procedure Laterality Date  . BIOPSY  07/26/2016   Procedure: BIOPSY;  Surgeon: [name], MD;  [facility];  Service: Endoscopy;;  gastric  . CARDIAC CATHETERIZATION  10/22/2001   normal coronary arteriea (Dr. Domenic Moras)  . COLONOSCOPY  08/31/2011   ACZ:YSAYTKZS rectal and colon polyps-treated. Single tubular adenoma. Next TCS 08/2016  . COLONOSCOPY N/A 07/26/2016   Dr. [name]: diverticulosis in sigmoid and descending colon. 6 mm benign splenic flexure. surveillance 5 yeras  . ESOPHAGOGASTRODUODENOSCOPY N/A 07/26/2016   Dr. [name]: normal esophagus, portal gastropathy, chronic gastritis, normal duodenum, screening EGD 2 years   .  ESOPHAGOGASTRODUODENOSCOPY (EGD) WITH ESOPHAGEAL DILATION N/A 05/15/2013   BPZ:WCHENI esophagus-status post Venia Minks dilation/Portal gastropathy. Antral erosions-status post biopsy. extrinsic compression along lesser curvature likely secondary to splenomegaly. gastric bx benign  . HERNIA REPAIR  03/2009  . POLYPECTOMY  07/26/2016   Procedure:  POLYPECTOMY;  Surgeon: [name], MD;  [facility]: [facility];  Service: Endoscopy;;  colon  . TRANSTHORACIC ECHOCARDIOGRAM  2011   EF 77-82%, stage 1 diastolic dysfunction, trace TR & pulm valve regurg   . VASECTOMY  1989    Family History Family History  Problem Relation Age of Onset  . Heart disease Mother   . Cancer Mother        lymph nodes  . Diabetes Mother   . Arthritis Unknown   . Lung disease Unknown   . Asthma Unknown   . Kidney disease Unknown   . Ovarian cancer Sister   . Diabetes Brother   . Heart disease Brother   . Hypertension Brother   . Diabetes Sister   . Diabetes Sister   . Cirrhosis Sister 63       no etoh  . Colon cancer Neg Hx      Social History  reports that he quit smoking about 6 years ago. His smoking use included cigarettes. He has a 45.00 pack-year smoking history. He has quit using smokeless tobacco. He reports that he does not drink alcohol or use drugs.  Medications  Current Outpatient Medications:  .  acetaminophen (TYLENOL) 500 MG tablet, Take 1,000 mg by mouth daily as needed for headache., Disp: , Rfl:  .  ALPRAZolam (XANAX) 0.25 MG tablet, Take 0.25 mg by mouth 2 (two) times daily. , Disp: , Rfl:  .  Artificial Tear Ointment (REFRESH P.M. OP), Apply 1 application to eye daily as needed (dry eye). As directed , Disp: , Rfl:  .  aspirin EC 81 MG tablet, Take 81 mg by mouth at bedtime. , Disp: , Rfl:  .  atorvastatin (LIPITOR) 40 MG tablet, Take 1 tablet (40 mg total) by mouth daily., Disp: 90 tablet, Rfl: 3 .  Choline Fenofibrate (FENOFIBRIC ACID) 135 MG CPDR, Take 1 capsule by mouth daily., Disp: 90 capsule, Rfl: 3 .  clotrimazole-betamethasone (LOTRISONE) cream, Apply 1 application topically 2 (two) times daily as needed (psoriasis)., Disp: , Rfl:  .  diltiazem (CARDIZEM CD) 240 MG 24 hr capsule, Take 1 capsule (240 mg total) by mouth daily., Disp: 90 capsule, Rfl: 3 .  doxycycline (VIBRAMYCIN) 100 MG capsule, , Disp: , Rfl:  .   fluticasone (FLONASE) 50 MCG/ACT nasal spray, Place 2 sprays into both nostrils daily as needed for allergies. , Disp: , Rfl:  .  gabapentin (NEURONTIN) 300 MG capsule, Take 1 capsule by mouth at bedtime., Disp: , Rfl: 5 .  HYDROcodone-acetaminophen (NORCO/VICODIN) 5-325 MG per tablet, Take 1 tablet by mouth every 8 (eight) hours as needed for moderate pain. (Patient taking differently: Take 1 tablet by mouth daily as needed for moderate pain. ), Disp: 90 tablet, Rfl: 0 .  JARDIANCE 25 MG TABS tablet, Take 25 mg by mouth every evening. , Disp: , Rfl: 3 .  Multiple Vitamin (MULITIVITAMIN WITH MINERALS) TABS, Take 1 tablet by mouth at bedtime. , Disp: , Rfl:  .  neomycin-bacitracin-polymyxin (NEOSPORIN) ointment, Apply 1 application topically daily as needed for wound care. apply to eye, Disp: , Rfl:  .  pantoprazole (PROTONIX) 40 MG tablet, Take 1 tablet (40 mg total) by mouth daily., Disp: 90 tablet,  Rfl: 3 .  predniSONE (DELTASONE) 20 MG tablet, , Disp: , Rfl:  .  PROAIR HFA 108 (90 Base) MCG/ACT inhaler, Inhale 2 puffs into the lungs every 4 (four) hours as needed for wheezing or shortness of breath. , Disp: , Rfl: 12 .  SitaGLIPtin-MetFORMIN HCl (JANUMET XR) [phone] MG TB24, Take 1 tablet by mouth daily., Disp: , Rfl:  .  tamsulosin (FLOMAX) 0.4 MG CAPS capsule, Take 0.4 mg by mouth daily., Disp: , Rfl:  .  tiotropium (SPIRIVA) 18 MCG inhalation capsule, Place 18 mcg into inhaler and inhale daily., Disp: , Rfl:   Allergies Codeine; Baclofen; and Simvastatin  Review of Systems Review of Systems - Oncology ROS as per HPI otherwise 12 point ROS is negative.   Physical Exam  Vitals Wt Readings from Last 3 Encounters:  08/20/17 160 lb 14.4 oz (73 kg)  08/05/17 165 lb 9.6 oz (75.1 kg)  05/07/17 160 lb (72.6 kg)   Temp Readings from Last 3 Encounters:  08/20/17 97.8 F (36.6 C) (Oral)  08/05/17 (!) 97.2 F (36.2 C) (Oral)  02/21/17 98 F (36.7 C) (Oral)   BP Readings from Last 3  Encounters:  08/20/17 (!) 112/52  08/05/17 116/66  05/07/17 (!) 100/56   Pulse Readings from Last 3 Encounters:  08/20/17 91  08/05/17 (!) 104  05/07/17 75   Constitutional: Well-developed, well-nourished, and in no distress.   HENT:  Head: Normocephalic and atraumatic.  Mouth/Throat: No oropharyngeal exudate. Mucosa moist. Eyes: Pupils are equal, round, and reactive to light. Conjunctivae are normal. No scleral icterus.  Neck: Normal range of motion. Neck supple. No JVD present.  Cardiovascular: Normal rate, regular rhythm and normal heart sounds.  Exam reveals no gallop and no friction rub.   No murmur heard. Pulmonary/Chest: Effort normal and breath sounds normal. No respiratory distress. No wheezes.No rales.  Abdominal: Soft. Bowel sounds are normal. No distension. There is no tenderness. There is no guarding.  Musculoskeletal: No edema or tenderness.  Lymphadenopathy: No cervical, axillary or supraclavicular adenopathy.  Neurological: Alert and oriented to person, place, and time. No cranial nerve deficit.  Skin: Skin is warm and dry. No rash noted. No erythema. No pallor.  Psychiatric: Affect and judgment normal.   Labs Appointment on 08/20/2017  Component Date Value Ref Range Status  . WBC 08/20/2017 3.6* 4.0 - 10.5 K/uL Final  . RBC 08/20/2017 6.04* 4.22 - 5.81 MIL/uL Final  . Hemoglobin 08/20/2017 15.9  13.0 - 17.0 g/dL Final  . HCT 08/20/2017 50.0  39.0 - 52.0 % Final  . MCV 08/20/2017 82.8  78.0 - 100.0 fL Final  . MCH 08/20/2017 26.3  26.0 - 34.0 pg Final  . MCHC 08/20/2017 31.8  30.0 - 36.0 g/dL Final  . RDW 08/20/2017 14.4  11.5 - 15.5 % Final  . Platelets 08/20/2017 97* 150 - 400 K/uL Final   Comment: CONSISTENT WITH PREVIOUS RESULT SPECIMEN CHECKED FOR CLOTS   . Neutrophils Relative % 08/20/2017 76  % Final  . Neutro Abs 08/20/2017 2.8  1.7 - 7.7 K/uL Final  . Lymphocytes Relative 08/20/2017 18  % Final  . Lymphs Abs 08/20/2017 0.7  0.7 - 4.0 K/uL Final  .  Monocytes Relative 08/20/2017 6  % Final  . Monocytes Absolute 08/20/2017 0.2  0.1 - 1.0 K/uL Final  . Eosinophils Relative 08/20/2017 0  % Final  . Eosinophils Absolute 08/20/2017 0.0  0.0 - 0.7 K/uL Final  . Basophils Relative 08/20/2017 0  % Final  .  Basophils Absolute 08/20/2017 0.0  0.0 - 0.1 K/uL Final   Performed at [facility], [address], [place], [place] [address]  . Sodium 08/20/2017 137  135 - 145 mmol/L Final  . Potassium 08/20/2017 4.4  3.5 - 5.1 mmol/L Final  . Chloride 08/20/2017 101  101 - 111 mmol/L Final  . CO2 08/20/2017 23  22 - 32 mmol/L Final  . Glucose, Bld 08/20/2017 223* 65 - 99 mg/dL Final  . BUN 08/20/2017 27* 6 - 20 mg/dL Final  . Creatinine, Ser 08/20/2017 1.29* 0.61 - 1.24 mg/dL Final  . Calcium 08/20/2017 9.7  8.9 - 10.3 mg/dL Final  . Total Protein 08/20/2017 9.3* 6.5 - 8.1 g/dL Final  . Albumin 08/20/2017 4.3  3.5 - 5.0 g/dL Final  . AST 08/20/2017 35  15 - 41 U/L Final  . ALT 08/20/2017 50  17 - 63 U/L Final  . Alkaline Phosphatase 08/20/2017 58  38 - 126 U/L Final  . Total Bilirubin 08/20/2017 0.7  0.3 - 1.2 mg/dL Final  . GFR calc non Af Amer 08/20/2017 58* >60 mL/min Final  . GFR calc Af Amer 08/20/2017 >60  >60 mL/min Final   Comment: (NOTE) The eGFR has been calculated using the CKD EPI equation. This calculation has not been validated in all clinical situations. eGFR's persistently <60 mL/min signify possible Chronic Kidney Disease.   Georgiann Hahn gap 08/20/2017 13  5 - 15 Final   Performed at [facility], [address], [place], [place] [address]    CT chest done 08/09/2017:  IMPRESSION: 1. Lung-RADS 4A, suspicious. Follow up low-dose chest CT without contrast in 3 months (please use the following order, "CT CHEST LCS NODULE FOLLOW-UP W/O CM") is recommended. Alternatively, PET may be considered when there is a solid component 87m or larger. 2. Prior granulomatous disease 3. Aortic Atherosclerosis (ICD10-I70.0) and Emphysema  (ICD10-J43.9). 4. Lad coronary artery calcifications.   Orders Placed This Encounter  Procedures  . NM PET Image Initial (PI) Skull Base To Thigh    Standing Status:   Future    Standing Expiration Date:   08/27/2017    Order Specific Question:   If indicated for the ordered procedure, I authorize the administration of a radiopharmaceutical per Radiology protocol    Answer:   Yes    Order Specific Question:   [facility]?    [facility]   Order Specific Question:   Radiology Contrast Protocol - do NOT remove file path    Answer:   \\[email or web address]  . NM PET Image Initial (PI) Skull Base To Thigh    Standing Status:   Future    Standing Expiration Date:   10/18/2017    Order Specific Question:   If indicated for the ordered procedure, I authorize the administration of a radiopharmaceutical per Radiology protocol    Answer:   Yes    Order Specific Question:   [facility]?    [facility]   Order Specific Question:   Radiology Contrast Protocol - do NOT remove file path    Answer:   \\[email or web address]  . CBC with Differential/Platelet    Standing Status:   Future    Standing Expiration Date:   09/17/2017  . Comprehensive metabolic panel    Standing Status:   Future    Standing Expiration Date:   09/17/2017  . Lactate dehydrogenase    Standing Status:   Future  Standing Expiration Date:   09/17/2017  . Protime-INR    Standing Status:   Future    Standing Expiration Date:   09/17/2017  . APTT    Standing Status:   Future    Standing Expiration Date:   09/17/2017       Zoila Shutter, MD

## 2017-08-26 NOTE — Progress Notes (Signed)
No hepatoma. Repeat in 6 months.

## 2017-08-27 NOTE — Telephone Encounter (Signed)
[name], this pt was referred to oncology by Dr [name] and was seen on 08/20/17.  PET scan was done 08/26/2017.  Results are in epic.  Please advise on next step.

## 2017-09-06 ENCOUNTER — Ambulatory Visit (HOSPITAL_COMMUNITY): Payer: PPO

## 2017-09-10 ENCOUNTER — Ambulatory Visit (HOSPITAL_COMMUNITY): Payer: PPO | Admitting: Internal Medicine

## 2017-09-10 ENCOUNTER — Other Ambulatory Visit (HOSPITAL_COMMUNITY): Payer: PPO

## 2017-09-18 ENCOUNTER — Other Ambulatory Visit (HOSPITAL_COMMUNITY): Payer: Self-pay

## 2017-09-18 DIAGNOSIS — K7400 Hepatic fibrosis, unspecified: Secondary | ICD-10-CM

## 2017-09-18 DIAGNOSIS — K760 Fatty (change of) liver, not elsewhere classified: Secondary | ICD-10-CM

## 2017-09-18 DIAGNOSIS — D696 Thrombocytopenia, unspecified: Secondary | ICD-10-CM

## 2017-09-18 DIAGNOSIS — R7989 Other specified abnormal findings of blood chemistry: Secondary | ICD-10-CM

## 2017-09-18 DIAGNOSIS — R945 Abnormal results of liver function studies: Secondary | ICD-10-CM

## 2017-09-18 DIAGNOSIS — D72819 Decreased white blood cell count, unspecified: Secondary | ICD-10-CM

## 2017-09-19 ENCOUNTER — Other Ambulatory Visit: Payer: Self-pay

## 2017-09-19 ENCOUNTER — Encounter (HOSPITAL_COMMUNITY): Payer: Self-pay | Admitting: Internal Medicine

## 2017-09-19 ENCOUNTER — Inpatient Hospital Stay (HOSPITAL_COMMUNITY): Payer: PPO | Attending: Internal Medicine | Admitting: Internal Medicine

## 2017-09-19 ENCOUNTER — Inpatient Hospital Stay (HOSPITAL_COMMUNITY): Payer: PPO

## 2017-09-19 DIAGNOSIS — E119 Type 2 diabetes mellitus without complications: Secondary | ICD-10-CM | POA: Insufficient documentation

## 2017-09-19 DIAGNOSIS — R911 Solitary pulmonary nodule: Secondary | ICD-10-CM | POA: Insufficient documentation

## 2017-09-19 DIAGNOSIS — D696 Thrombocytopenia, unspecified: Secondary | ICD-10-CM

## 2017-09-19 DIAGNOSIS — M5136 Other intervertebral disc degeneration, lumbar region: Secondary | ICD-10-CM | POA: Insufficient documentation

## 2017-09-19 DIAGNOSIS — J449 Chronic obstructive pulmonary disease, unspecified: Secondary | ICD-10-CM | POA: Insufficient documentation

## 2017-09-19 DIAGNOSIS — K7400 Hepatic fibrosis, unspecified: Secondary | ICD-10-CM

## 2017-09-19 DIAGNOSIS — K219 Gastro-esophageal reflux disease without esophagitis: Secondary | ICD-10-CM | POA: Diagnosis not present

## 2017-09-19 DIAGNOSIS — E785 Hyperlipidemia, unspecified: Secondary | ICD-10-CM | POA: Insufficient documentation

## 2017-09-19 DIAGNOSIS — R945 Abnormal results of liver function studies: Secondary | ICD-10-CM

## 2017-09-19 DIAGNOSIS — K7581 Nonalcoholic steatohepatitis (NASH): Secondary | ICD-10-CM | POA: Insufficient documentation

## 2017-09-19 DIAGNOSIS — I10 Essential (primary) hypertension: Secondary | ICD-10-CM | POA: Insufficient documentation

## 2017-09-19 DIAGNOSIS — R7989 Other specified abnormal findings of blood chemistry: Secondary | ICD-10-CM

## 2017-09-19 DIAGNOSIS — D72819 Decreased white blood cell count, unspecified: Secondary | ICD-10-CM | POA: Insufficient documentation

## 2017-09-19 DIAGNOSIS — Z87891 Personal history of nicotine dependence: Secondary | ICD-10-CM

## 2017-09-19 DIAGNOSIS — Z79899 Other long term (current) drug therapy: Secondary | ICD-10-CM | POA: Diagnosis not present

## 2017-09-19 DIAGNOSIS — K760 Fatty (change of) liver, not elsewhere classified: Secondary | ICD-10-CM

## 2017-09-19 DIAGNOSIS — Z7982 Long term (current) use of aspirin: Secondary | ICD-10-CM | POA: Insufficient documentation

## 2017-09-19 LAB — COMPREHENSIVE METABOLIC PANEL
ALK PHOS: 56 U/L (ref 38–126)
ALT: 28 U/L (ref 17–63)
ANION GAP: 13 (ref 5–15)
AST: 23 U/L (ref 15–41)
Albumin: 3.7 g/dL (ref 3.5–5.0)
BILIRUBIN TOTAL: 1 mg/dL (ref 0.3–1.2)
BUN: 18 mg/dL (ref 6–20)
CALCIUM: 8.9 mg/dL (ref 8.9–10.3)
CO2: 22 mmol/L (ref 22–32)
Chloride: 100 mmol/L — ABNORMAL LOW (ref 101–111)
Creatinine, Ser: 0.88 mg/dL (ref 0.61–1.24)
GFR calc Af Amer: >60 mL/min (ref >60)
GLUCOSE: 102 mg/dL — AB (ref 65–99)
POTASSIUM: 4.1 mmol/L (ref 3.5–5.1)
Sodium: 135 mmol/L (ref 135–145)
TOTAL PROTEIN: 7.4 g/dL (ref 6.5–8.1)

## 2017-09-19 LAB — CBC WITH DIFFERENTIAL/PLATELET
Basophils Absolute: 0 10*3/uL (ref 0.0–0.1)
Basophils Relative: 0 %
Eosinophils Absolute: 0.1 10*3/uL (ref 0.0–0.7)
Eosinophils Relative: 2 %
HEMATOCRIT: 44.1 % (ref 39.0–52.0)
HEMOGLOBIN: 14.1 g/dL (ref 13.0–17.0)
LYMPHS ABS: 1 10*3/uL (ref 0.7–4.0)
LYMPHS PCT: 21 %
MCH: 26.2 pg (ref 26.0–34.0)
MCHC: 32 g/dL (ref 30.0–36.0)
MCV: 81.8 fL (ref 78.0–100.0)
MONO ABS: 0.4 10*3/uL (ref 0.1–1.0)
MONOS PCT: 9 %
NEUTROS ABS: 3.2 10*3/uL (ref 1.7–7.7)
NEUTROS PCT: 68 %
Platelets: 116 10*3/uL — ABNORMAL LOW (ref 150–400)
RBC: 5.39 MIL/uL (ref 4.22–5.81)
RDW: 14.8 % (ref 11.5–15.5)
WBC: 4.6 10*3/uL (ref 4.0–10.5)

## 2017-09-19 LAB — APTT: APTT: 39 s — AB (ref 24–36)

## 2017-09-19 LAB — PROTIME-INR
INR: 1.03
Prothrombin Time: 13.4 seconds (ref 11.4–15.2)

## 2017-09-19 LAB — LACTATE DEHYDROGENASE: LDH: 142 U/L (ref 98–192)

## 2017-09-19 NOTE — Patient Instructions (Addendum)
[place] at [facility] Discharge Instructions   You were seen today by Dr. Zoila Shutter She went over your recent scan and it shows that there maybe an infection but no cancer. She also went over your labs and they look better overall. She will call and discuss the scan with Dr. [name]. Return in 6 months for labs and follow up.   Thank you for choosing [place] at [facility] to provide your oncology and hematology care.  To afford each patient quality time with our provider, please arrive at least 15 minutes before your scheduled appointment time.    If you have a lab appointment with the [place] please come in thru the  Main Entrance and check in at the main information desk  You need to re-schedule your appointment should you arrive 10 or more minutes late.  We strive to give you quality time with our providers, and arriving late affects you and other patients whose appointments are after yours.  Also, if you no show three or more times for appointments you may be dismissed from the clinic at the providers discretion.     Again, thank you for choosing [facility].  Our hope is that these requests will decrease the amount of time that you wait before being seen by our physicians.       _____________________________________________________________  Should you have questions after your visit to [facility], please contact our office at [phone] between the hours of 8:30 a.m. and 4:30 p.m.  Voicemails left after 4:30 p.m. will not be returned until the following business day.  For prescription refill requests, have your pharmacy contact our office.       Resources For Cancer Patients and their Caregivers ? American Cancer Society: Can assist with transportation, wigs, general needs, runs Look Good Feel Better.        228-687-6771 ? Cancer Care: Provides financial assistance, online support  groups, medication/co-pay assistance.  1-800-813-HOPE 705-675-4329) ? [place] Assists [place] cancer patients and their families through emotional , educational and financial support.  805-160-2445 ? [place] DSS Where to apply for food stamps, Medicaid and utility assistance. 909-867-4134 ? RCATS: Transportation to medical appointments. 825-543-3686 ? Social Security Administration: May apply for disability if have a Stage IV cancer. 226-259-0371 (623)884-9639 ? [employer], Disability and Transit Services: Assists with nutrition, care and transit needs. [place] Support Programs:   > Cancer Support Group  2nd Tuesday of the month 1pm-2pm, Journey Room   > Creative Journey  3rd Tuesday of the month 1130am-1pm, Journey Room

## 2017-09-20 NOTE — Progress Notes (Signed)
Diagnosis Thrombocytopenia ([place]) - Plan: CBC with Differential/Platelet, Comprehensive metabolic panel, Lactate dehydrogenase  Staging Cancer Staging No matching staging information was found for the patient.  Assessment and Plan:   1.  Thrombocytopenia ([place]):  Labs done 08/20/2017 shows plts 97,000.  He has reported history of splenomegaly and fatty liver.  Repeat labs done 09/19/2017 show WBC 4.6 HB 14 and plts 116,000.  He will RTC in 6 months For repeat labs.  Suspect related to chronic infection history.  He shows no evidence of coagulopathy.  PET scan negative for adenopathy or splenomegaly.     2.  Pulmonary nodule.  This was noted on CT chest done for lung cancer screening.   PET scan done 08/26/2017 showed   IMPRESSION: Suspected sequela of chronic atypical mycobacterial infection, as described above. Prior dominant nodular opacity at the posterior left lung base has improved from recent prior CT. There were No findings suspicious for malignancy. He should continue lung cancer screening program as recommended.  I have discussed scan results with Dr. [name] and he will follow-up with him to review and determine if pt needs bronchoscopy.    3. Leukopenia.  Labs done 09/19/2017 show WBC 4.6 HB 14.1 plts 116,000.  He has evidence of fatty liver but no evidence of splenomegaly on recent scan.  Suspect may be related to chronic infection history.  He will RTC In 6 months for repeat labs.    4.  Fatty liver.  LFTs WNL on labs done 09/19/2017.  He has evidence of fatty liver on PET scan done 08/26/2017.    5. Recent fall.  He reports mild tail bone pain.  Tylenol prn and follow-up with PCP if ongoing symptoms.    CURRENT THERAPY: Observation  INTERVAL HISTORY: 62 y.o. male with mild thrombocytopenia and leukopenia in the setting of splenomegaly with fatty infiltration of liver.  Current Status:  Pt is seen today for followup.  He is here to go over PET scan and labs.  He reports recent fall  and has some tailbone pain..    Problem List Patient Active Problem List   Diagnosis Date Noted  . NASH (nonalcoholic steatohepatitis) [K75.81] 08/05/2017  . Liver fibrosis [K74.0] 01/31/2017  . Hx of adenomatous colonic polyps [Z86.010] 07/05/2016  . Hyperproteinemia [E88.09] 09/05/2014  . HTN (hypertension) [I10] 10/14/2013  . Dyslipidemia [E78.5] 10/14/2013  . Type 2 diabetes mellitus without complication, without long-term current use of insulin ([place]) [E11.9] 10/14/2013  . Leukopenia [D72.819] 08/08/2013  . Thrombocytopenia ([place]) [D69.6] 08/08/2013  . Lower abdominal pain [R10.30] 07/06/2013  . Abnormal LFTs [R94.5] 07/06/2013  . GERD (gastroesophageal reflux disease) [K21.9] 07/06/2013  . Unspecified constipation [K59.00] 07/06/2013  . Abdominal bloating [R14.0] 04/21/2013  . Abdominal pain, generalized [R10.84] 04/21/2013  . Non-alcoholic fatty liver disease [K76.0] 04/21/2013  . Splenomegaly [R16.1] 04/21/2013  . Lumbago [M54.5] 11/26/2011  . Muscle weakness (generalized) [M62.81] 11/26/2011  . Bilateral leg pain [M79.604, M79.605] 11/06/2011  . Back pain with radiation [M54.9] 11/06/2011  . DDD (degenerative disc disease), lumbar [M51.36] 11/06/2011    Past Medical History Past Medical History:  Diagnosis Date  . Anxiety   . Asthma   . Chest pain    a. 2003 Cath: nl cors.  [name] COPD (chronic obstructive pulmonary disease) ([place])   . Depression   . Diabetes mellitus   . Dyslipidemia   . Dysrhythmia   . Fracture 12/15   lower back "L-1"  . GERD (gastroesophageal reflux disease)   . Hypertension   .  PONV (postoperative nausea and vomiting)   . Splenomegaly     Past Surgical History Past Surgical History:  Procedure Laterality Date  . BIOPSY  07/26/2016   Procedure: BIOPSY;  Surgeon: [name], MD;  [facility]: [facility];  Service: Endoscopy;;  gastric  . CARDIAC CATHETERIZATION  10/22/2001   normal coronary arteriea (Dr. Domenic Moras)  . COLONOSCOPY   08/31/2011   UOH:FGBMSXJD rectal and colon polyps-treated. Single tubular adenoma. Next TCS 08/2016  . COLONOSCOPY N/A 07/26/2016   Dr. [name]: diverticulosis in sigmoid and descending colon. 6 mm benign splenic flexure. surveillance 5 yeras  . ESOPHAGOGASTRODUODENOSCOPY N/A 07/26/2016   Dr. [name]: normal esophagus, portal gastropathy, chronic gastritis, normal duodenum, screening EGD 2 years   . ESOPHAGOGASTRODUODENOSCOPY (EGD) WITH ESOPHAGEAL DILATION N/A 05/15/2013   BZM:CEYEMV esophagus-status post Venia Minks dilation/Portal gastropathy. Antral erosions-status post biopsy. extrinsic compression along lesser curvature likely secondary to splenomegaly. gastric bx benign  . HERNIA REPAIR  03/2009  . POLYPECTOMY  07/26/2016   Procedure: POLYPECTOMY;  Surgeon: [name], MD;  Location: [facility];  Service: Endoscopy;;  colon  . TRANSTHORACIC ECHOCARDIOGRAM  2011   EF 36-12%, stage 1 diastolic dysfunction, trace TR & pulm valve regurg   . VASECTOMY  1989    Family History Family History  Problem Relation Age of Onset  . Heart disease Mother   . Cancer Mother        lymph nodes  . Diabetes Mother   . Arthritis Unknown   . Lung disease Unknown   . Asthma Unknown   . Kidney disease Unknown   . Ovarian cancer Sister   . Diabetes Brother   . Heart disease Brother   . Hypertension Brother   . Diabetes Sister   . Diabetes Sister   . Cirrhosis Sister 45       no etoh  . Colon cancer Neg Hx      Social History  reports that he quit smoking about 6 years ago. His smoking use included cigarettes. He has a 45.00 pack-year smoking history. He has quit using smokeless tobacco. He reports that he does not drink alcohol or use drugs.  Medications  Current Outpatient Medications:  .  acetaminophen (TYLENOL) 500 MG tablet, Take 1,000 mg by mouth daily as needed for headache., Disp: , Rfl:  .  ALPRAZolam (XANAX) 0.25 MG tablet, Take 0.25 mg by mouth 2 (two) times daily. , Disp: , Rfl:  .   Artificial Tear Ointment (REFRESH P.M. OP), Apply 1 application to eye daily as needed (dry eye). As directed , Disp: , Rfl:  .  aspirin EC 81 MG tablet, Take 81 mg by mouth at bedtime. , Disp: , Rfl:  .  atorvastatin (LIPITOR) 40 MG tablet, Take 1 tablet (40 mg total) by mouth daily., Disp: 90 tablet, Rfl: 3 .  Choline Fenofibrate (FENOFIBRIC ACID) 135 MG CPDR, Take 1 capsule by mouth daily., Disp: 90 capsule, Rfl: 3 .  clotrimazole-betamethasone (LOTRISONE) cream, Apply 1 application topically 2 (two) times daily as needed (psoriasis)., Disp: , Rfl:  .  diltiazem (CARDIZEM CD) 240 MG 24 hr capsule, Take 1 capsule (240 mg total) by mouth daily., Disp: 90 capsule, Rfl: 3 .  doxycycline (VIBRAMYCIN) 100 MG capsule, , Disp: , Rfl:  .  fluticasone (FLONASE) 50 MCG/ACT nasal spray, Place 2 sprays into both nostrils daily as needed for allergies. , Disp: , Rfl:  .  gabapentin (NEURONTIN) 300 MG capsule, Take 1 capsule by mouth at  bedtime., Disp: , Rfl: 5 .  HYDROcodone-acetaminophen (NORCO/VICODIN) 5-325 MG per tablet, Take 1 tablet by mouth every 8 (eight) hours as needed for moderate pain. (Patient taking differently: Take 1 tablet by mouth daily as needed for moderate pain. ), Disp: 90 tablet, Rfl: 0 .  JARDIANCE 25 MG TABS tablet, Take 25 mg by mouth every evening. , Disp: , Rfl: 3 .  Multiple Vitamin (MULITIVITAMIN WITH MINERALS) TABS, Take 1 tablet by mouth at bedtime. , Disp: , Rfl:  .  neomycin-bacitracin-polymyxin (NEOSPORIN) ointment, Apply 1 application topically daily as needed for wound care. apply to eye, Disp: , Rfl:  .  pantoprazole (PROTONIX) 40 MG tablet, Take 1 tablet (40 mg total) by mouth daily., Disp: 90 tablet, Rfl: 3 .  predniSONE (DELTASONE) 20 MG tablet, , Disp: , Rfl:  .  PROAIR HFA 108 (90 Base) MCG/ACT inhaler, Inhale 2 puffs into the lungs every 4 (four) hours as needed for wheezing or shortness of breath. , Disp: , Rfl: 12 .  SitaGLIPtin-MetFORMIN HCl (JANUMET XR) [phone]  MG TB24, Take 1 tablet by mouth daily., Disp: , Rfl:  .  tamsulosin (FLOMAX) 0.4 MG CAPS capsule, Take 0.4 mg by mouth daily., Disp: , Rfl:  .  tiotropium (SPIRIVA) 18 MCG inhalation capsule, Place 18 mcg into inhaler and inhale daily., Disp: , Rfl:   Allergies Codeine; Baclofen; and Simvastatin  Review of Systems Review of Systems - Oncology ROS as per HPI otherwise 12 point ROS is negative other than fatigue and tail bone pain   Physical Exam  Vitals Wt Readings from Last 3 Encounters:  09/19/17 155 lb (70.3 kg)  08/20/17 160 lb 14.4 oz (73 kg)  08/05/17 165 lb 9.6 oz (75.1 kg)   Temp Readings from Last 3 Encounters:  09/19/17 98.6 F (37 C) (Oral)  08/20/17 97.8 F (36.6 C) (Oral)  08/05/17 (!) 97.2 F (36.2 C) (Oral)   BP Readings from Last 3 Encounters:  09/19/17 110/74  08/20/17 (!) 112/52  08/05/17 116/66   Pulse Readings from Last 3 Encounters:  09/19/17 97  08/20/17 91  08/05/17 (!) 104   Constitutional: Well-developed, well-nourished, and in no distress.   HENT: Head: Normocephalic and atraumatic.  Mouth/Throat: No oropharyngeal exudate. Mucosa moist. Eyes: Pupils are equal, round, and reactive to light. Conjunctivae are normal. No scleral icterus.  Neck: Normal range of motion. Neck supple. No JVD present.  Cardiovascular: Normal rate, regular rhythm and normal heart sounds.  Exam reveals no gallop and no friction rub.   No murmur heard. Pulmonary/Chest: Effort normal and breath sounds normal. No respiratory distress. No wheezes.No rales.  Abdominal: Soft. Bowel sounds are normal. No distension. There is no tenderness. There is no guarding.  Musculoskeletal: No edema or tenderness.  Lymphadenopathy: No cervical, axillary or supraclavicular adenopathy.  Neurological: Alert and oriented to person, place, and time. No cranial nerve deficit.  Skin: Skin is warm and dry. No rash noted. No erythema. No pallor.  Psychiatric: Affect and judgment normal.    Labs Lab on 09/19/2017  Component Date Value Ref Range Status  . WBC 09/19/2017 4.6  4.0 - 10.5 K/uL Final  . RBC 09/19/2017 5.39  4.22 - 5.81 MIL/uL Final  . Hemoglobin 09/19/2017 14.1  13.0 - 17.0 g/dL Final  . HCT 09/19/2017 44.1  39.0 - 52.0 % Final  . MCV 09/19/2017 81.8  78.0 - 100.0 fL Final  . MCH 09/19/2017 26.2  26.0 - 34.0 pg Final  . MCHC 09/19/2017 32.0  30.0 - 36.0 g/dL Final  . RDW 09/19/2017 14.8  11.5 - 15.5 % Final  . Platelets 09/19/2017 116* 150 - 400 K/uL Final   Comment: CONSISTENT WITH PREVIOUS RESULT SPECIMEN CHECKED FOR CLOTS   . Neutrophils Relative % 09/19/2017 68  % Final  . Neutro Abs 09/19/2017 3.2  1.7 - 7.7 K/uL Final  . Lymphocytes Relative 09/19/2017 21  % Final  . Lymphs Abs 09/19/2017 1.0  0.7 - 4.0 K/uL Final  . Monocytes Relative 09/19/2017 9  % Final  . Monocytes Absolute 09/19/2017 0.4  0.1 - 1.0 K/uL Final  . Eosinophils Relative 09/19/2017 2  % Final  . Eosinophils Absolute 09/19/2017 0.1  0.0 - 0.7 K/uL Final  . Basophils Relative 09/19/2017 0  % Final  . Basophils Absolute 09/19/2017 0.0  0.0 - 0.1 K/uL Final   Performed at [facility], [address], [place], [place] [address]  . Sodium 09/19/2017 135  135 - 145 mmol/L Final  . Potassium 09/19/2017 4.1  3.5 - 5.1 mmol/L Final  . Chloride 09/19/2017 100* 101 - 111 mmol/L Final  . CO2 09/19/2017 22  22 - 32 mmol/L Final  . Glucose, Bld 09/19/2017 102* 65 - 99 mg/dL Final  . BUN 09/19/2017 18  6 - 20 mg/dL Final  . Creatinine, Ser 09/19/2017 0.88  0.61 - 1.24 mg/dL Final  . Calcium 09/19/2017 8.9  8.9 - 10.3 mg/dL Final  . Total Protein 09/19/2017 7.4  6.5 - 8.1 g/dL Final  . Albumin 09/19/2017 3.7  3.5 - 5.0 g/dL Final  . AST 09/19/2017 23  15 - 41 U/L Final  . ALT 09/19/2017 28  17 - 63 U/L Final  . Alkaline Phosphatase 09/19/2017 56  38 - 126 U/L Final  . Total Bilirubin 09/19/2017 1.0  0.3 - 1.2 mg/dL Final  . GFR calc non Af Amer 09/19/2017 >60  >60 mL/min Final  . GFR calc  Af Amer 09/19/2017 >60  >60 mL/min Final   Comment: (NOTE) The eGFR has been calculated using the CKD EPI equation. This calculation has not been validated in all clinical situations. eGFR's persistently <60 mL/min signify possible Chronic Kidney Disease.   Georgiann Hahn gap 09/19/2017 13  5 - 15 Final   Performed at [facility], [address] [place], [place] [address]  . LDH 09/19/2017 142  98 - 192 U/L Final   Performed at [facility], [address], [place],  [address]  . Prothrombin Time 09/19/2017 13.4  11.4 - 15.2 seconds Final  . INR 09/19/2017 1.03   Final   Performed at [facility], [address] [place],  [address]  . aPTT 09/19/2017 39* 24 - 36 seconds Final   Comment:        IF BASELINE aPTT IS ELEVATED, SUGGEST PATIENT RISK ASSESSMENT BE USED TO DETERMINE APPROPRIATE ANTICOAGULANT THERAPY. Performed at [facility], [address], [place],  [address]      Pet scan done 08/26/2017:  IMPRESSION: Suspected sequela of chronic atypical mycobacterial infection, as described above. Prior dominant nodular opacity at the posterior left lung base has improved from recent prior CT.  No findings suspicious for malignancy. Orders Placed This Encounter  Procedures  . CBC with Differential/Platelet    Standing Status:   Future    Standing Expiration Date:   09/20/2018  . Comprehensive metabolic panel    Standing Status:   Future    Standing Expiration Date:   09/20/2018  . Lactate dehydrogenase  Standing Status:   Future    Standing Expiration Date:   09/20/2018       Zoila Shutter MD

## 2017-09-23 DIAGNOSIS — J441 Chronic obstructive pulmonary disease with (acute) exacerbation: Secondary | ICD-10-CM | POA: Diagnosis not present

## 2017-09-23 DIAGNOSIS — I10 Essential (primary) hypertension: Secondary | ICD-10-CM | POA: Diagnosis not present

## 2017-09-23 DIAGNOSIS — E119 Type 2 diabetes mellitus without complications: Secondary | ICD-10-CM | POA: Diagnosis not present

## 2017-10-03 ENCOUNTER — Encounter: Payer: Self-pay | Admitting: Internal Medicine

## 2017-10-07 ENCOUNTER — Other Ambulatory Visit (HOSPITAL_COMMUNITY): Payer: Self-pay | Admitting: Pulmonary Disease

## 2017-10-07 ENCOUNTER — Ambulatory Visit (HOSPITAL_COMMUNITY)
Admission: RE | Admit: 2017-10-07 | Discharge: 2017-10-07 | Disposition: A | Discharge status: Home / Self Care | Payer: PPO | Source: Ambulatory Visit | Attending: Pulmonary Disease | Admitting: Pulmonary Disease

## 2017-10-07 DIAGNOSIS — R918 Other nonspecific abnormal finding of lung field: Secondary | ICD-10-CM | POA: Insufficient documentation

## 2017-10-07 DIAGNOSIS — E119 Type 2 diabetes mellitus without complications: Secondary | ICD-10-CM | POA: Diagnosis not present

## 2017-10-07 DIAGNOSIS — I10 Essential (primary) hypertension: Secondary | ICD-10-CM | POA: Diagnosis not present

## 2017-10-07 DIAGNOSIS — R05 Cough: Secondary | ICD-10-CM

## 2017-10-07 DIAGNOSIS — J441 Chronic obstructive pulmonary disease with (acute) exacerbation: Secondary | ICD-10-CM | POA: Diagnosis not present

## 2017-10-07 DIAGNOSIS — J8410 Pulmonary fibrosis, unspecified: Secondary | ICD-10-CM | POA: Diagnosis not present

## 2017-10-07 DIAGNOSIS — K210 Gastro-esophageal reflux disease with esophagitis: Secondary | ICD-10-CM | POA: Diagnosis not present

## 2017-10-07 DIAGNOSIS — R059 Cough, unspecified: Secondary | ICD-10-CM

## 2017-10-07 DIAGNOSIS — N401 Enlarged prostate with lower urinary tract symptoms: Secondary | ICD-10-CM | POA: Diagnosis not present

## 2017-10-07 DIAGNOSIS — E785 Hyperlipidemia, unspecified: Secondary | ICD-10-CM | POA: Diagnosis not present

## 2017-10-17 ENCOUNTER — Encounter (HOSPITAL_COMMUNITY): Payer: Self-pay

## 2017-10-17 ENCOUNTER — Inpatient Hospital Stay (HOSPITAL_COMMUNITY): Payer: PPO

## 2017-10-17 ENCOUNTER — Other Ambulatory Visit: Payer: Self-pay

## 2017-10-17 ENCOUNTER — Inpatient Hospital Stay (HOSPITAL_COMMUNITY)
Admission: AD | Admit: 2017-10-17 | Discharge: 2017-10-21 | DRG: 641 | Disposition: A | Discharge status: Home / Self Care | Payer: PPO | Source: Ambulatory Visit | Attending: Pulmonary Disease | Admitting: Pulmonary Disease

## 2017-10-17 DIAGNOSIS — Z79899 Other long term (current) drug therapy: Secondary | ICD-10-CM | POA: Diagnosis not present

## 2017-10-17 DIAGNOSIS — J441 Chronic obstructive pulmonary disease with (acute) exacerbation: Secondary | ICD-10-CM | POA: Diagnosis present

## 2017-10-17 DIAGNOSIS — Z888 Allergy status to other drugs, medicaments and biological substances status: Secondary | ICD-10-CM

## 2017-10-17 DIAGNOSIS — E119 Type 2 diabetes mellitus without complications: Secondary | ICD-10-CM | POA: Diagnosis not present

## 2017-10-17 DIAGNOSIS — K219 Gastro-esophageal reflux disease without esophagitis: Secondary | ICD-10-CM | POA: Diagnosis present

## 2017-10-17 DIAGNOSIS — T380X5A Adverse effect of glucocorticoids and synthetic analogues, initial encounter: Secondary | ICD-10-CM | POA: Diagnosis present

## 2017-10-17 DIAGNOSIS — E1165 Type 2 diabetes mellitus with hyperglycemia: Secondary | ICD-10-CM | POA: Diagnosis not present

## 2017-10-17 DIAGNOSIS — J449 Chronic obstructive pulmonary disease, unspecified: Secondary | ICD-10-CM | POA: Diagnosis not present

## 2017-10-17 DIAGNOSIS — Z885 Allergy status to narcotic agent status: Secondary | ICD-10-CM | POA: Diagnosis not present

## 2017-10-17 DIAGNOSIS — I10 Essential (primary) hypertension: Secondary | ICD-10-CM | POA: Diagnosis not present

## 2017-10-17 DIAGNOSIS — R252 Cramp and spasm: Secondary | ICD-10-CM | POA: Diagnosis present

## 2017-10-17 DIAGNOSIS — Z87891 Personal history of nicotine dependence: Secondary | ICD-10-CM | POA: Diagnosis not present

## 2017-10-17 DIAGNOSIS — J440 Chronic obstructive pulmonary disease with acute lower respiratory infection: Secondary | ICD-10-CM | POA: Diagnosis not present

## 2017-10-17 DIAGNOSIS — R51 Headache: Secondary | ICD-10-CM | POA: Diagnosis not present

## 2017-10-17 DIAGNOSIS — R918 Other nonspecific abnormal finding of lung field: Secondary | ICD-10-CM | POA: Diagnosis present

## 2017-10-17 DIAGNOSIS — R509 Fever, unspecified: Secondary | ICD-10-CM | POA: Diagnosis not present

## 2017-10-17 DIAGNOSIS — E785 Hyperlipidemia, unspecified: Secondary | ICD-10-CM | POA: Diagnosis present

## 2017-10-17 DIAGNOSIS — Z7982 Long term (current) use of aspirin: Secondary | ICD-10-CM

## 2017-10-17 DIAGNOSIS — R05 Cough: Secondary | ICD-10-CM | POA: Diagnosis not present

## 2017-10-17 DIAGNOSIS — F419 Anxiety disorder, unspecified: Secondary | ICD-10-CM | POA: Diagnosis not present

## 2017-10-17 DIAGNOSIS — Z7984 Long term (current) use of oral hypoglycemic drugs: Secondary | ICD-10-CM

## 2017-10-17 DIAGNOSIS — E860 Dehydration: Secondary | ICD-10-CM | POA: Diagnosis not present

## 2017-10-17 DIAGNOSIS — I959 Hypotension, unspecified: Secondary | ICD-10-CM | POA: Diagnosis not present

## 2017-10-17 LAB — CBC WITH DIFFERENTIAL/PLATELET
BASOS PCT: 0 %
Basophils Absolute: 0 10*3/uL (ref 0.0–0.1)
EOS ABS: 0.1 10*3/uL (ref 0.0–0.7)
EOS PCT: 3 %
HCT: 48.8 % (ref 39.0–52.0)
Hemoglobin: 15.6 g/dL (ref 13.0–17.0)
Lymphocytes Relative: 11 %
Lymphs Abs: 0.6 10*3/uL — ABNORMAL LOW (ref 0.7–4.0)
MCH: 26.4 pg (ref 26.0–34.0)
MCHC: 32 g/dL (ref 30.0–36.0)
MCV: 82.7 fL (ref 78.0–100.0)
MONO ABS: 0.4 10*3/uL (ref 0.1–1.0)
Monocytes Relative: 7 %
Neutro Abs: 4.5 10*3/uL (ref 1.7–7.7)
Neutrophils Relative %: 79 %
PLATELETS: 88 10*3/uL — AB (ref 150–400)
RBC: 5.9 MIL/uL — AB (ref 4.22–5.81)
RDW: 16.6 % — AB (ref 11.5–15.5)
WBC: 5.6 10*3/uL (ref 4.0–10.5)

## 2017-10-17 LAB — COMPREHENSIVE METABOLIC PANEL
ALK PHOS: 50 U/L (ref 38–126)
ALT: 28 U/L (ref 17–63)
ANION GAP: 14 (ref 5–15)
AST: 21 U/L (ref 15–41)
Albumin: 3.8 g/dL (ref 3.5–5.0)
BILIRUBIN TOTAL: 1.8 mg/dL — AB (ref 0.3–1.2)
BUN: 22 mg/dL — AB (ref 6–20)
CALCIUM: 9.5 mg/dL (ref 8.9–10.3)
CO2: 21 mmol/L — ABNORMAL LOW (ref 22–32)
Chloride: 98 mmol/L — ABNORMAL LOW (ref 101–111)
Creatinine, Ser: 0.96 mg/dL (ref 0.61–1.24)
GFR calc Af Amer: >60 mL/min (ref >60)
GLUCOSE: 153 mg/dL — AB (ref 65–99)
Potassium: 3.7 mmol/L (ref 3.5–5.1)
Sodium: 133 mmol/L — ABNORMAL LOW (ref 135–145)
TOTAL PROTEIN: 7.6 g/dL (ref 6.5–8.1)

## 2017-10-17 LAB — GLUCOSE, CAPILLARY
Glucose-Capillary: 189 mg/dL — ABNORMAL HIGH (ref 65–99)
Glucose-Capillary: 243 mg/dL — ABNORMAL HIGH (ref 65–99)

## 2017-10-17 MED ORDER — ONDANSETRON HCL 4 MG/2ML IJ SOLN: 4.0000 mg | Freq: Four times a day (QID) | INTRAMUSCULAR | Status: DC | PRN

## 2017-10-17 MED ORDER — ALBUTEROL SULFATE (2.5 MG/3ML) 0.083% IN NEBU: 2.5000 mg | INHALATION_SOLUTION | Freq: Four times a day (QID) | RESPIRATORY_TRACT | Status: DC

## 2017-10-17 MED ORDER — ADULT MULTIVITAMIN W/MINERALS CH
1.0000 | ORAL_TABLET | Freq: Every day | ORAL | Status: DC
  Administered 2017-10-17 – 2017-10-20 (×4): 1 via ORAL
  Filled 2017-10-17 (×4): qty 1

## 2017-10-17 MED ORDER — IPRATROPIUM BROMIDE 0.02 % IN SOLN: 0.5000 mg | Freq: Four times a day (QID) | RESPIRATORY_TRACT | Status: DC

## 2017-10-17 MED ORDER — ALBUTEROL SULFATE (2.5 MG/3ML) 0.083% IN NEBU: 2.5000 mg | INHALATION_SOLUTION | RESPIRATORY_TRACT | Status: DC | PRN

## 2017-10-17 MED ORDER — VANCOMYCIN HCL IN DEXTROSE 750-5 MG/150ML-% IV SOLN
750.0000 mg | Freq: Two times a day (BID) | INTRAVENOUS | Status: DC
  Administered 2017-10-18 – 2017-10-21 (×6): 750 mg via INTRAVENOUS
  Filled 2017-10-17 (×8): qty 150

## 2017-10-17 MED ORDER — VANCOMYCIN HCL IN DEXTROSE 1-5 GM/200ML-% IV SOLN
1000.0000 mg | Freq: Once | INTRAVENOUS | Status: AC
  Administered 2017-10-17: 1000 mg via INTRAVENOUS
  Filled 2017-10-17: qty 200

## 2017-10-17 MED ORDER — IPRATROPIUM-ALBUTEROL 0.5-2.5 (3) MG/3ML IN SOLN
3.0000 mL | Freq: Four times a day (QID) | RESPIRATORY_TRACT | Status: DC
  Administered 2017-10-17 (×2): 3 mL via RESPIRATORY_TRACT
  Filled 2017-10-17 (×2): qty 3

## 2017-10-17 MED ORDER — HYDROCODONE-ACETAMINOPHEN 5-325 MG PO TABS: 1.0000 | ORAL_TABLET | Freq: Three times a day (TID) | ORAL | Status: DC | PRN

## 2017-10-17 MED ORDER — PIPERACILLIN-TAZOBACTAM 3.375 G IVPB
3.3750 g | Freq: Three times a day (TID) | INTRAVENOUS | Status: DC
  Administered 2017-10-17 – 2017-10-21 (×11): 3.375 g via INTRAVENOUS
  Filled 2017-10-17 (×14): qty 50

## 2017-10-17 MED ORDER — ALPRAZOLAM 0.25 MG PO TABS
0.2500 mg | ORAL_TABLET | Freq: Two times a day (BID) | ORAL | Status: DC
  Administered 2017-10-17 – 2017-10-21 (×9): 0.25 mg via ORAL
  Filled 2017-10-17 (×9): qty 1

## 2017-10-17 MED ORDER — PIPERACILLIN-TAZOBACTAM 3.375 G IVPB
3.3750 g | Freq: Once | INTRAVENOUS | Status: AC
  Administered 2017-10-17: 3.375 g via INTRAVENOUS
  Filled 2017-10-17: qty 50

## 2017-10-17 MED ORDER — TAMSULOSIN HCL 0.4 MG PO CAPS
0.4000 mg | ORAL_CAPSULE | Freq: Every day | ORAL | Status: DC
  Administered 2017-10-17 – 2017-10-21 (×5): 0.4 mg via ORAL
  Filled 2017-10-17 (×5): qty 1

## 2017-10-17 MED ORDER — METHYLPREDNISOLONE SODIUM SUCC 40 MG IJ SOLR
40.0000 mg | Freq: Two times a day (BID) | INTRAMUSCULAR | Status: DC
  Administered 2017-10-17 – 2017-10-21 (×8): 40 mg via INTRAVENOUS
  Filled 2017-10-17 (×8): qty 1

## 2017-10-17 MED ORDER — PANTOPRAZOLE SODIUM 40 MG PO TBEC
40.0000 mg | DELAYED_RELEASE_TABLET | Freq: Every day | ORAL | Status: DC
  Administered 2017-10-17 – 2017-10-21 (×5): 40 mg via ORAL
  Filled 2017-10-17 (×5): qty 1

## 2017-10-17 MED ORDER — SODIUM CHLORIDE 0.9 % IV SOLN
INTRAVENOUS | Status: DC
  Administered 2017-10-17 – 2017-10-20 (×7): via INTRAVENOUS

## 2017-10-17 MED ORDER — ACETAMINOPHEN 650 MG RE SUPP: 650.0000 mg | Freq: Four times a day (QID) | RECTAL | Status: DC | PRN

## 2017-10-17 MED ORDER — FENOFIBRATE 160 MG PO TABS
160.0000 mg | ORAL_TABLET | Freq: Every day | ORAL | Status: DC
  Administered 2017-10-18 – 2017-10-21 (×4): 160 mg via ORAL
  Filled 2017-10-17 (×5): qty 1

## 2017-10-17 MED ORDER — ACETAMINOPHEN 325 MG PO TABS: 650.0000 mg | ORAL_TABLET | Freq: Four times a day (QID) | ORAL | Status: DC | PRN

## 2017-10-17 MED ORDER — ASPIRIN EC 81 MG PO TBEC
81.0000 mg | DELAYED_RELEASE_TABLET | Freq: Every day | ORAL | Status: DC
Start: 2017-10-17 — End: 2017-10-21
  Administered 2017-10-17 – 2017-10-20 (×4): 81 mg via ORAL
  Filled 2017-10-17 (×5): qty 1

## 2017-10-17 MED ORDER — GUAIFENESIN ER 600 MG PO TB12
600.0000 mg | ORAL_TABLET | Freq: Two times a day (BID) | ORAL | Status: DC
  Administered 2017-10-17 – 2017-10-21 (×9): 600 mg via ORAL
  Filled 2017-10-17 (×9): qty 1

## 2017-10-17 MED ORDER — GABAPENTIN 300 MG PO CAPS
300.0000 mg | ORAL_CAPSULE | Freq: Every day | ORAL | Status: DC
  Administered 2017-10-17 – 2017-10-20 (×4): 300 mg via ORAL
  Filled 2017-10-17 (×5): qty 1

## 2017-10-17 MED ORDER — DILTIAZEM HCL ER COATED BEADS 240 MG PO CP24
240.0000 mg | ORAL_CAPSULE | Freq: Every day | ORAL | Status: DC
  Administered 2017-10-17 – 2017-10-21 (×5): 240 mg via ORAL
  Filled 2017-10-17 (×5): qty 1

## 2017-10-17 MED ORDER — IPRATROPIUM-ALBUTEROL 0.5-2.5 (3) MG/3ML IN SOLN
3.0000 mL | Freq: Three times a day (TID) | RESPIRATORY_TRACT | Status: DC
  Administered 2017-10-18 – 2017-10-21 (×10): 3 mL via RESPIRATORY_TRACT
  Filled 2017-10-17 (×10): qty 3

## 2017-10-17 MED ORDER — INSULIN ASPART 100 UNIT/ML ~~LOC~~ SOLN
0.0000 [IU] | Freq: Every day | SUBCUTANEOUS | Status: DC
  Administered 2017-10-17: 2 [IU] via SUBCUTANEOUS
  Administered 2017-10-18: 5 [IU] via SUBCUTANEOUS
  Administered 2017-10-19: 3 [IU] via SUBCUTANEOUS
  Administered 2017-10-20: 5 [IU] via SUBCUTANEOUS

## 2017-10-17 MED ORDER — ALBUTEROL SULFATE HFA 108 (90 BASE) MCG/ACT IN AERS: 2.0000 | INHALATION_SPRAY | RESPIRATORY_TRACT | Status: DC | PRN

## 2017-10-17 MED ORDER — ATORVASTATIN CALCIUM 40 MG PO TABS
40.0000 mg | ORAL_TABLET | Freq: Every day | ORAL | Status: DC
  Administered 2017-10-17 – 2017-10-21 (×5): 40 mg via ORAL
  Filled 2017-10-17 (×5): qty 1

## 2017-10-17 MED ORDER — ONDANSETRON HCL 4 MG PO TABS: 4.0000 mg | ORAL_TABLET | Freq: Four times a day (QID) | ORAL | Status: DC | PRN

## 2017-10-17 MED ORDER — INSULIN ASPART 100 UNIT/ML ~~LOC~~ SOLN
0.0000 [IU] | Freq: Three times a day (TID) | SUBCUTANEOUS | Status: DC
  Administered 2017-10-18 (×2): 7 [IU] via SUBCUTANEOUS
  Administered 2017-10-18: 4 [IU] via SUBCUTANEOUS
  Administered 2017-10-19: 7 [IU] via SUBCUTANEOUS
  Administered 2017-10-19: 11 [IU] via SUBCUTANEOUS
  Administered 2017-10-19 – 2017-10-20 (×2): 7 [IU] via SUBCUTANEOUS
  Administered 2017-10-20: 4 [IU] via SUBCUTANEOUS
  Administered 2017-10-20: 11 [IU] via SUBCUTANEOUS
  Administered 2017-10-21: 7 [IU] via SUBCUTANEOUS

## 2017-10-17 MED ORDER — FLUTICASONE PROPIONATE 50 MCG/ACT NA SUSP: 2.0000 | Freq: Every day | NASAL | Status: DC | PRN

## 2017-10-17 MED ORDER — ENOXAPARIN SODIUM 40 MG/0.4ML ~~LOC~~ SOLN
40.0000 mg | SUBCUTANEOUS | Status: DC
  Administered 2017-10-17 – 2017-10-20 (×4): 40 mg via SUBCUTANEOUS
  Filled 2017-10-17 (×5): qty 0.4

## 2017-10-17 NOTE — H&P (Signed)
Admitted from my office with febrile illness. Temp 102. he's been on antibiotics and seroids. Abnormal CT with ? MAC. To take iv treatment since he failed outpatient. Full dictation to follow.

## 2017-10-17 NOTE — Progress Notes (Signed)
Pharmacy Antibiotic Note  GWEN EDLER is a 62 y.o. male admitted on 10/17/2017 with pneumonia.  Pharmacy has been consulted for Vancomycin and zosyn dosing.  Plan: Vancomycin 1067m loading dose, then 7516mIV every 12 hours.  Goal trough 15-20 mcg/mL. Zosyn 3.375g IV q8h (4 hour infusion).  F/U cxs and clinical progress Monitor V/S, labs, and levels as indicated  Height: 5' 7"  (170.2 cm) Weight: 145 lb (65.8 kg) IBW/kg (Calculated) : 66.1  Temp (24hrs), Avg:98.2 F (36.8 C), Min:98.2 F (36.8 C), Max:98.2 F (36.8 C)  Recent Labs  Lab 10/17/17 1345  CREATININE 0.96    Estimated Creatinine Clearance: 74.3 mL/min (by C-G formula based on SCr of 0.96 mg/dL).    Allergies  Allergen Reactions  . Codeine Rash  . Baclofen     lethargic   . Simvastatin Nausea Only    Antimicrobials this admission: Vancomycin 4/11 >>  Zosyn 4/11 >>   Dose adjustments this admission: N/A  Microbiology results: 4/11 BCx: pending 4/11 UCx: pending 4/11 AFB Sputum: TBC  Thank you for allowing pharmacy to be a part of this patient's care.  LoIsac SarnaBS PhVena AustriaBCCalifornialinical Pharmacist Pager #[phone]/05/2018 2:49 PM

## 2017-10-18 LAB — GLUCOSE, CAPILLARY
GLUCOSE-CAPILLARY: 161 mg/dL — AB (ref 65–99)
GLUCOSE-CAPILLARY: 351 mg/dL — AB (ref 65–99)
Glucose-Capillary: 250 mg/dL — ABNORMAL HIGH (ref 65–99)
Glucose-Capillary: 229 mg/dL — ABNORMAL HIGH (ref 65–99)

## 2017-10-18 LAB — MYCOPLASMA PNEUMONIAE ANTIBODY, IGM: Mycoplasma pneumo IgM: <770 U/mL (ref 0–769)

## 2017-10-18 LAB — HIV ANTIBODY (ROUTINE TESTING W REFLEX): HIV SCREEN 4TH GENERATION: NONREACTIVE

## 2017-10-18 MED ORDER — ORAL CARE MOUTH RINSE
15.0000 mL | Freq: Two times a day (BID) | OROMUCOSAL | Status: DC
  Administered 2017-10-18 – 2017-10-19 (×3): 15 mL via OROMUCOSAL

## 2017-10-18 NOTE — Progress Notes (Signed)
Subjective: He says he feels better.  He has had episodes of low blood pressure.  He did not have significant fever overnight.  He is still coughing and congested.  Objective: Vital signs in last 24 hours: Temp:  [97.7 F (36.5 C)-98.7 F (37.1 C)] 97.7 F (36.5 C) (04/12 0527) Pulse Rate:  [82-124] 82 (04/12 0527) Resp:  [22] 22 (04/11 1301) BP: (83-118)/(51-69) 101/69 (04/12 0527) SpO2:  [89 %-95 %] 92 % (04/12 0750) Weight:  [65.8 kg (145 lb)] 65.8 kg (145 lb) (04/11 1301) Weight change:     Intake/Output from previous day: 04/11 0701 - 04/12 0700 In: 630 [P.O.:240; I.V.:40; IV Piggyback:350] Out: -   PHYSICAL EXAM General appearance: alert, cooperative and no distress Resp: rhonchi bilaterally Cardio: regular rate and rhythm, S1, S2 normal, no murmur, click, rub or gallop GI: soft, non-tender; bowel sounds normal; no masses,  no organomegaly Extremities: extremities normal, atraumatic, no cyanosis or edema  Lab Results:  Results for orders placed or performed during the hospital encounter of 10/17/17 (from the past 48 hour(s))  HIV antibody (Routine Testing)     Status: None   Collection Time: 10/17/17  1:45 PM  Result Value Ref Range   HIV Screen 4th Generation wRfx Non Reactive Non Reactive    Comment: (NOTE) Performed At: [facility] [place], [place] [id number] Rush Farmer MD [id number] Performed at [facility], [address], [place], [place] [address]   Comprehensive metabolic panel     Status: Abnormal   Collection Time: 10/17/17  1:45 PM  Result Value Ref Range   Sodium 133 (L) 135 - 145 mmol/L   Potassium 3.7 3.5 - 5.1 mmol/L   Chloride 98 (L) 101 - 111 mmol/L   CO2 21 (L) 22 - 32 mmol/L   Glucose, Bld 153 (H) 65 - 99 mg/dL   BUN 22 (H) 6 - 20 mg/dL   Creatinine, Ser 0.96 0.61 - 1.24 mg/dL   Calcium 9.5 8.9 - 10.3 mg/dL   Total Protein 7.6 6.5 - 8.1 g/dL   Albumin 3.8 3.5 - 5.0 g/dL   AST 21 15 - 41 U/L   ALT 28 17 -  63 U/L   Alkaline Phosphatase 50 38 - 126 U/L   Total Bilirubin 1.8 (H) 0.3 - 1.2 mg/dL   GFR calc non Af Amer >60 >60 mL/min   GFR calc Af Amer >60 >60 mL/min    Comment: (NOTE) The eGFR has been calculated using the CKD EPI equation. This calculation has not been validated in all clinical situations. eGFR's persistently <60 mL/min signify possible Chronic Kidney Disease.    Anion gap 14 5 - 15    Comment: Performed at [facility], [address], [place], [place] [address]  CBC WITH DIFFERENTIAL     Status: Abnormal   Collection Time: 10/17/17  1:45 PM  Result Value Ref Range   WBC 5.6 4.0 - 10.5 K/uL   RBC 5.90 (H) 4.22 - 5.81 MIL/uL   Hemoglobin 15.6 13.0 - 17.0 g/dL   HCT 48.8 39.0 - 52.0 %   MCV 82.7 78.0 - 100.0 fL   MCH 26.4 26.0 - 34.0 pg   MCHC 32.0 30.0 - 36.0 g/dL   RDW 16.6 (H) 11.5 - 15.5 %   Platelets 88 (L) 150 - 400 K/uL    Comment: PLATELET COUNT CONFIRMED BY SMEAR SPECIMEN CHECKED FOR CLOTS    Neutrophils Relative % 79 %   Neutro Abs 4.5 1.7 -  7.7 K/uL   Lymphocytes Relative 11 %   Lymphs Abs 0.6 (L) 0.7 - 4.0 K/uL   Monocytes Relative 7 %   Monocytes Absolute 0.4 0.1 - 1.0 K/uL   Eosinophils Relative 3 %   Eosinophils Absolute 0.1 0.0 - 0.7 K/uL   Basophils Relative 0 %   Basophils Absolute 0.0 0.0 - 0.1 K/uL    Comment: Performed at [facility], [address] [place], [place] [address]  Culture, blood (Routine X 2) w Reflex to ID Panel     Status: None (Preliminary result)   Collection Time: 10/17/17  1:47 PM  Result Value Ref Range   Specimen Description BLOOD LEFT ARM    Special Requests      BOTTLES DRAWN AEROBIC AND ANAEROBIC Blood Culture adequate volume   Culture      NO GROWTH < 24 HOURS Performed at [facility], [address] [place], [place] [address]    Report Status PENDING   Culture, blood (Routine X 2) w Reflex to ID Panel     Status: None (Preliminary result)   Collection Time: 10/17/17  1:47 PM  Result Value Ref Range    Specimen Description BLOOD RIGHT ARM    Special Requests      BOTTLES DRAWN AEROBIC AND ANAEROBIC Blood Culture adequate volume   Culture      NO GROWTH < 24 HOURS Performed at [facility], [address], [place], [place] [address]    Report Status PENDING   Glucose, capillary     Status: Abnormal   Collection Time: 10/17/17  6:57 PM  Result Value Ref Range   Glucose-Capillary 189 (H) 65 - 99 mg/dL  Glucose, capillary     Status: Abnormal   Collection Time: 10/17/17  8:51 PM  Result Value Ref Range   Glucose-Capillary 243 (H) 65 - 99 mg/dL   Comment 1 Notify RN    Comment 2 Document in Chart   Glucose, capillary     Status: Abnormal   Collection Time: 10/18/17  7:46 AM  Result Value Ref Range   Glucose-Capillary 161 (H) 65 - 99 mg/dL    ABGS No results for input(s): PHART, PO2ART, TCO2, HCO3 in the last 72 hours.  Invalid input(s): PCO2 CULTURES Recent Results (from the past 240 hour(s))  Culture, blood (Routine X 2) w Reflex to ID Panel     Status: None (Preliminary result)   Collection Time: 10/17/17  1:47 PM  Result Value Ref Range Status   Specimen Description BLOOD LEFT ARM  Final   Special Requests   Final    BOTTLES DRAWN AEROBIC AND ANAEROBIC Blood Culture adequate volume   Culture   Final    NO GROWTH < 24 HOURS Performed at [facility], [address] [place], [place] [address]    Report Status PENDING  Incomplete  Culture, blood (Routine X 2) w Reflex to ID Panel     Status: None (Preliminary result)   Collection Time: 10/17/17  1:47 PM  Result Value Ref Range Status   Specimen Description BLOOD RIGHT ARM  Final   Special Requests   Final    BOTTLES DRAWN AEROBIC AND ANAEROBIC Blood Culture adequate volume   Culture   Final    NO GROWTH < 24 HOURS Performed at [facility], [address], [place], [place] [address]    Report Status PENDING  Incomplete   Studies/Results: X-ray Chest Pa And Lateral  Result Date: 10/17/2017 CLINICAL DATA:  Fever or  cough, asthma, COPD, diabetes mellitus, hypertension, former smoker, GERD EXAM: CHEST - 2 VIEW COMPARISON:  10/07/2017 FINDINGS: Normal heart size, mediastinal contours, and pulmonary vascularity. Calcified granulomata LEFT upper lobe. Chronic accentuation of inferior RIGHT hilum unchanged. Emphysematous and bronchitic changes with chronic accentuation of interstitial markings in the mid RIGHT lung, little changed when accounting for differences in technique. No definite acute infiltrate, pleural effusion or pneumothorax. Bones demineralized with persistent compression deformity of a vertebra at the thoracolumbar junction. IMPRESSION: Changes of COPD and old granulomatous disease. Chronic inferior RIGHT hilar prominence. No acute abnormalities. Electronically Signed   By: Lavonia Dana M.D.   On: 10/17/2017 13:39    Medications:  Prior to Admission:  Medications Prior to Admission  Medication Sig Dispense Refill Last Dose  . acetaminophen (TYLENOL) 500 MG tablet Take 1,000 mg by mouth daily as needed for headache.   10/16/2017 at Unknown time  . ALPRAZolam (XANAX) 0.25 MG tablet Take 0.25 mg by mouth 2 (two) times daily.    10/16/2017 at Unknown time  . Apple Cider Vinegar 600 MG CAPS Take 2 capsules by mouth daily.   10/16/2017 at Unknown time  . Artificial Tear Ointment (REFRESH P.M. OP) Apply 1 application to eye daily as needed (dry eye). As directed    unknown  . aspirin EC 81 MG tablet Take 81 mg by mouth at bedtime.    10/16/2017 at Unknown time  . atorvastatin (LIPITOR) 40 MG tablet Take 1 tablet (40 mg total) by mouth daily. 90 tablet 3 10/16/2017 at Unknown time  . Choline Fenofibrate (FENOFIBRIC ACID) 135 MG CPDR Take 1 capsule by mouth daily. 90 capsule 3 10/16/2017 at Unknown time  . clotrimazole-betamethasone (LOTRISONE) cream Apply 1 application topically 2 (two) times daily as needed (psoriasis).   unknown  . diltiazem (CARDIZEM CD) 240 MG 24 hr capsule Take 1 capsule (240 mg total) by mouth  daily. 90 capsule 3 10/16/2017 at Unknown time  . fluticasone (FLONASE) 50 MCG/ACT nasal spray Place 2 sprays into both nostrils daily as needed for allergies.    unknown  . gabapentin (NEURONTIN) 300 MG capsule Take 1 capsule by mouth at bedtime.  5 Past Week at Unknown time  . HYDROcodone-acetaminophen (NORCO/VICODIN) 5-325 MG per tablet Take 1 tablet by mouth every 8 (eight) hours as needed for moderate pain. (Patient taking differently: Take 1 tablet by mouth daily as needed for moderate pain. ) 90 tablet 0 10/16/2017 at Unknown time  . JARDIANCE 25 MG TABS tablet Take 25 mg by mouth every evening.   3 10/16/2017 at Unknown time  . Multiple Vitamin (MULITIVITAMIN WITH MINERALS) TABS Take 1 tablet by mouth at bedtime.    10/16/2017 at Unknown time  . pantoprazole (PROTONIX) 40 MG tablet Take 1 tablet (40 mg total) by mouth daily. 90 tablet 3 10/16/2017 at Unknown time  . PROAIR HFA 108 (90 Base) MCG/ACT inhaler Inhale 2 puffs into the lungs every 4 (four) hours as needed for wheezing or shortness of breath.   12 10/16/2017 at Unknown time  . SitaGLIPtin-MetFORMIN HCl (JANUMET XR) [phone] MG TB24 Take 1 tablet by mouth daily.   10/16/2017 at Unknown time  . tamsulosin (FLOMAX) 0.4 MG CAPS capsule Take 0.4 mg by mouth daily.   10/16/2017 at Unknown time  . tiotropium (SPIRIVA) 18 MCG inhalation capsule Place 18 mcg into inhaler and inhale daily.   10/16/2017 at Unknown time   Scheduled: . ALPRAZolam  0.25 mg Oral BID  . aspirin EC  81 mg Oral QHS  . atorvastatin  40 mg Oral Daily  . diltiazem  240 mg Oral Daily  . enoxaparin (LOVENOX) injection  40 mg Subcutaneous Q24H  . fenofibrate  160 mg Oral Daily  . gabapentin  300 mg Oral QHS  . guaiFENesin  600 mg Oral BID  . insulin aspart  0-20 Units Subcutaneous TID WC  . insulin aspart  0-5 Units Subcutaneous QHS  . ipratropium-albuterol  3 mL Nebulization TID  . methylPREDNISolone (SOLU-MEDROL) injection  40 mg Intravenous Q12H  . multivitamin with  minerals  1 tablet Oral QHS  . pantoprazole  40 mg Oral Daily  . tamsulosin  0.4 mg Oral Daily   Continuous: . sodium chloride 50 mL/hr at 10/17/17 2353  . piperacillin-tazobactam (ZOSYN)  IV 3.375 g (10/18/17 0649)  . vancomycin     [id number] **OR** acetaminophen, albuterol, fluticasone, HYDROcodone-acetaminophen, ondansetron **OR** ondansetron (ZOFRAN) IV  Assesment: He was admitted with febrile illness that has not resolved despite taking 3 antibiotics as an outpatient.  He has abnormal chest x-ray but I do not think it is totally clear that his lungs are the source of his problem although we do not have any other source at this time.  He has diabetes and is on sliding scale insulin. Active Problems:   Febrile illness, acute    Plan: Continue IV antibiotics.  Continue other treatments.    LOS: 1 day   Jeray Shugart L 10/18/2017, 8:50 AM

## 2017-10-18 NOTE — H&P (Addendum)
[name] MRN: [id number] DOB/AGE: 1955/08/02 62 y.o. Primary Care Physician:Demetris Meinhardt, Percell Miller, MD Admit date: 10/17/2017 Chief Complaint: Fever HPI: This is a 61 year old who has had an approximately 6-week history of cough and congestion associated with fever.  He came to my office on the day of admission and had continued cough and congestion despite having recently finished a course of Levaquin and prednisone.  He has chronic changes in his right lung that may be atypical infection.  He had temperature as high as 102.  He has COPD at baseline.  He is not had chills.  He has not had any chest pain.  He has had some nausea.  He has had significant muscle cramps.  He has had some headaches.  He has generalized malaise.  No nausea or vomiting.  He is not coughing up much sputum now but has been in the past he is significantly short of breath.  This is documentation of the history and physical performed in my office on 10/17/2017  Past Medical History:  Diagnosis Date  . Anxiety   . Asthma   . Chest pain    a. 2003 Cath: nl cors.  [name] COPD (chronic obstructive pulmonary disease) ([place])   . Depression   . Diabetes mellitus   . Dyslipidemia   . Dysrhythmia   . Fracture 12/15   lower back "L-1"  . GERD (gastroesophageal reflux disease)   . Hypertension   . PONV (postoperative nausea and vomiting)   . Splenomegaly    Past Surgical History:  Procedure Laterality Date  . BIOPSY  07/26/2016   Procedure: BIOPSY;  Surgeon: [name], MD;  [facility]: [facility];  Service: Endoscopy;;  gastric  . CARDIAC CATHETERIZATION  10/22/2001   normal coronary arteriea (Dr. Domenic Moras)  . COLONOSCOPY  08/31/2011   LGX:QJJHERDE rectal and colon polyps-treated. Single tubular adenoma. Next TCS 08/2016  . COLONOSCOPY N/A 07/26/2016   Dr. [name]: diverticulosis in sigmoid and descending colon. 6 mm benign splenic flexure. surveillance 5 yeras  . ESOPHAGOGASTRODUODENOSCOPY N/A 07/26/2016   Dr. [name]: normal  esophagus, portal gastropathy, chronic gastritis, normal duodenum, screening EGD 2 years   . ESOPHAGOGASTRODUODENOSCOPY (EGD) WITH ESOPHAGEAL DILATION N/A 05/15/2013   YCX:KGYJEH esophagus-status post Venia Minks dilation/Portal gastropathy. Antral erosions-status post biopsy. extrinsic compression along lesser curvature likely secondary to splenomegaly. gastric bx benign  . HERNIA REPAIR  03/2009  . POLYPECTOMY  07/26/2016   Procedure: POLYPECTOMY;  Surgeon: [name], MD;  Location: [facility];  Service: Endoscopy;;  colon  . TRANSTHORACIC ECHOCARDIOGRAM  2011   EF 63-14%, stage 1 diastolic dysfunction, trace TR & pulm valve regurg   . VASECTOMY  1989        Family History  Problem Relation Age of Onset  . Heart disease Mother   . Cancer Mother        lymph nodes  . Diabetes Mother   . Arthritis Unknown   . Lung disease Unknown   . Asthma Unknown   . Kidney disease Unknown   . Ovarian cancer Sister   . Diabetes Brother   . Heart disease Brother   . Hypertension Brother   . Diabetes Sister   . Diabetes Sister   . Cirrhosis Sister 42       no etoh  . Colon cancer Neg Hx     Social History:  reports that he quit smoking about 6 years ago. His smoking use included cigarettes. He has a 45.00 pack-year smoking history.  He has quit using smokeless tobacco. He reports that he does not drink alcohol or use drugs.   Allergies:  Allergies  Allergen Reactions  . Codeine Rash  . Baclofen     lethargic   . Simvastatin Nausea Only    Medications Prior to Admission  Medication Sig Dispense Refill  . acetaminophen (TYLENOL) 500 MG tablet Take 1,000 mg by mouth daily as needed for headache.    . ALPRAZolam (XANAX) 0.25 MG tablet Take 0.25 mg by mouth 2 (two) times daily.     [name] Apple Cider Vinegar 600 MG CAPS Take 2 capsules by mouth daily.    . Artificial Tear Ointment (REFRESH P.M. OP) Apply 1 application to eye daily as needed (dry eye). As directed     . aspirin EC 81 MG  tablet Take 81 mg by mouth at bedtime.     [name] atorvastatin (LIPITOR) 40 MG tablet Take 1 tablet (40 mg total) by mouth daily. 90 tablet 3  . Choline Fenofibrate (FENOFIBRIC ACID) 135 MG CPDR Take 1 capsule by mouth daily. 90 capsule 3  . clotrimazole-betamethasone (LOTRISONE) cream Apply 1 application topically 2 (two) times daily as needed (psoriasis).    [name] diltiazem (CARDIZEM CD) 240 MG 24 hr capsule Take 1 capsule (240 mg total) by mouth daily. 90 capsule 3  . fluticasone (FLONASE) 50 MCG/ACT nasal spray Place 2 sprays into both nostrils daily as needed for allergies.     [name] gabapentin (NEURONTIN) 300 MG capsule Take 1 capsule by mouth at bedtime.  5  . HYDROcodone-acetaminophen (NORCO/VICODIN) 5-325 MG per tablet Take 1 tablet by mouth every 8 (eight) hours as needed for moderate pain. (Patient taking differently: Take 1 tablet by mouth daily as needed for moderate pain. ) 90 tablet 0  . JARDIANCE 25 MG TABS tablet Take 25 mg by mouth every evening.   3  . Multiple Vitamin (MULITIVITAMIN WITH MINERALS) TABS Take 1 tablet by mouth at bedtime.     . pantoprazole (PROTONIX) 40 MG tablet Take 1 tablet (40 mg total) by mouth daily. 90 tablet 3  . PROAIR HFA 108 (90 Base) MCG/ACT inhaler Inhale 2 puffs into the lungs every 4 (four) hours as needed for wheezing or shortness of breath.   12  . SitaGLIPtin-MetFORMIN HCl (JANUMET XR) [phone] MG TB24 Take 1 tablet by mouth daily.    . tamsulosin (FLOMAX) 0.4 MG CAPS capsule Take 0.4 mg by mouth daily.    [name] tiotropium (SPIRIVA) 18 MCG inhalation capsule Place 18 mcg into inhaler and inhale daily.         ELF:YBOFB from the symptoms mentioned above,there are no other symptoms referable to all systems reviewed.  Except as mentioned 10 point review of systems is negative  Physical Exam: Blood pressure 101/69, pulse 82, temperature 97.7 F (36.5 C), temperature source Oral, resp. rate (!) 22, height 5' 7"  (1.702 m), weight 65.8 kg (145 lb), SpO2 92  %. Constitutional: He is awake and alert and looks chronically and acutely sick.  Eyes: Pupils react EOMI.  Ears nose mouth and throat: Mucous membranes are slightly dry.  Hearing is grossly normal.  Cardiovascular: His heart is regular with normal heart sounds.  Respiratory: His respiratory effort is increased.  He has rhonchi and wheezes bilaterally.  Gastrointestinal: His abdomen is soft without masses.  Skin: Warm and dry musculoskeletal: Normal strength upper and lower extremities bilaterally.  Neurological: No focal abnormalities.  Psychiatric: He is mildly anxious   Recent Labs  10/17/17 1345  WBC 5.6  NEUTROABS 4.5  HGB 15.6  HCT 48.8  MCV 82.7  PLT 88*   Recent Labs    10/17/17 1345  NA 133*  K 3.7  CL 98*  CO2 21*  GLUCOSE 153*  BUN 22*  CREATININE 0.96  CALCIUM 9.5  lablast2(ast:2,ALT:2,alkphos:2,bilitot:2,prot:2,albumin:2)@    Recent Results (from the past 240 hour(s))  Culture, blood (Routine X 2) w Reflex to ID Panel     Status: None (Preliminary result)   Collection Time: 10/17/17  1:47 PM  Result Value Ref Range Status   Specimen Description BLOOD LEFT ARM  Final   Special Requests   Final    BOTTLES DRAWN AEROBIC AND ANAEROBIC Blood Culture adequate volume   Culture   Final    NO GROWTH < 24 HOURS Performed at [facility], [address], [place], [place] [address]    Report Status PENDING  Incomplete  Culture, blood (Routine X 2) w Reflex to ID Panel     Status: None (Preliminary result)   Collection Time: 10/17/17  1:47 PM  Result Value Ref Range Status   Specimen Description BLOOD RIGHT ARM  Final   Special Requests   Final    BOTTLES DRAWN AEROBIC AND ANAEROBIC Blood Culture adequate volume   Culture   Final    NO GROWTH < 24 HOURS Performed at [facility], [address] [place], [place] [address]    Report Status PENDING  Incomplete     X-ray Chest Pa And Lateral  Result Date: 10/17/2017 CLINICAL DATA:  Fever or cough, asthma, COPD,  diabetes mellitus, hypertension, former smoker, GERD EXAM: CHEST - 2 VIEW COMPARISON:  10/07/2017 FINDINGS: Normal heart size, mediastinal contours, and pulmonary vascularity. Calcified granulomata LEFT upper lobe. Chronic accentuation of inferior RIGHT hilum unchanged. Emphysematous and bronchitic changes with chronic accentuation of interstitial markings in the mid RIGHT lung, little changed when accounting for differences in technique. No definite acute infiltrate, pleural effusion or pneumothorax. Bones demineralized with persistent compression deformity of a vertebra at the thoracolumbar junction. IMPRESSION: Changes of COPD and old granulomatous disease. Chronic inferior RIGHT hilar prominence. No acute abnormalities. Electronically Signed   By: Lavonia Dana M.D.   On: 10/17/2017 13:39   Dg Chest 2 View  Result Date: 10/07/2017 CLINICAL DATA:  62 year old male with cough congestion shortness of breath and fever intermittently for 1 month. Currently on antibiotic for lung infection. EXAM: CHEST - 2 VIEW COMPARISON:  Low-dose screening chest CT 08/09/2017 and earlier. FINDINGS: Calcified right upper lobe granulomas redemonstrated. Mildly larger lung volumes than on 2015 radiographs. Mediastinal contours remain normal. No pneumothorax, pleural effusion or consolidation. However, there are asymmetric increased reticular interstitial opacities in both lungs since 2015, greater on the right and in the anterior right upper lobe. An anterior wedge compression fracture at the thoracolumbar junction was partially visible in February and appears chronic. Negative visible bowel gas pattern. IMPRESSION: 1. Increased asymmetric pulmonary interstitial opacity since 2015 suspicious for viral/atypical respiratory infection as suspected on the February CT. This is maximal in the right upper lobe. No pleural effusion or consolidation. 2. Otherwise chronic lung disease, and calcified right upper lobe granulomas. Electronically  Signed   By: Genevie Ann M.D.   On: 10/07/2017 12:08   Impression: He has acute febrile illness which is somewhat chronic also.  He is been treated with outpatient antibiotics and steroids but is still having trouble.  He is going to be admitted for blood cultures sputum  culture start him on IV antibiotics. Active Problems:   Febrile illness, acute     Plan: As above      Urijah Arko L   10/18/2017, 8:46 AM

## 2017-10-18 NOTE — Care Management Important Message (Signed)
Important Message  Patient Details  Name: [name] MRN: [id number] Date of Birth: 03-26-1956   Medicare Important Message Given:  Yes    Shelda Altes 10/18/2017, 12:48 PM

## 2017-10-19 DIAGNOSIS — E860 Dehydration: Secondary | ICD-10-CM | POA: Diagnosis present

## 2017-10-19 DIAGNOSIS — J441 Chronic obstructive pulmonary disease with (acute) exacerbation: Secondary | ICD-10-CM

## 2017-10-19 LAB — GLUCOSE, CAPILLARY
GLUCOSE-CAPILLARY: 267 mg/dL — AB (ref 65–99)
Glucose-Capillary: 228 mg/dL — ABNORMAL HIGH (ref 65–99)
Glucose-Capillary: 228 mg/dL — ABNORMAL HIGH (ref 65–99)
Glucose-Capillary: 259 mg/dL — ABNORMAL HIGH (ref 65–99)

## 2017-10-19 LAB — URINE CULTURE
CULTURE: NO GROWTH
Special Requests: NORMAL

## 2017-10-19 MED ORDER — TRAZODONE HCL 50 MG PO TABS
50.0000 mg | ORAL_TABLET | Freq: Every evening | ORAL | Status: DC | PRN
  Administered 2017-10-19: 50 mg via ORAL
  Filled 2017-10-19: qty 1

## 2017-10-19 NOTE — Progress Notes (Signed)
Subjective: He says he is improved.  He is not coughing anything up.  His congestion is better.  No fever.  No other new complaints.  Objective: Vital signs in last 24 hours: Temp:  [97.6 F (36.4 C)-98 F (36.7 C)] 97.6 F (36.4 C) (04/13 0747) Pulse Rate:  [70-98] 90 (04/13 0747) Resp:  [16-20] 19 (04/13 0747) BP: (94-112)/(60-67) 96/67 (04/13 0747) SpO2:  [92 %-96 %] 96 % (04/13 0749) Weight change:  Last BM Date: 10/18/17  Intake/Output from previous day: 04/12 0701 - 04/13 0700 In: 2770 [P.O.:480; I.V.:1790; IV Piggyback:500] Out: -   PHYSICAL EXAM General appearance: alert, cooperative and no distress Resp: rhonchi bilaterally Cardio: regular rate and rhythm, S1, S2 normal, no murmur, click, rub or gallop GI: soft, non-tender; bowel sounds normal; no masses,  no organomegaly Extremities: extremities normal, atraumatic, no cyanosis or edema  Lab Results:  Results for orders placed or performed during the hospital encounter of 10/17/17 (from the past 48 hour(s))  HIV antibody (Routine Testing)     Status: None   Collection Time: 10/17/17  1:45 PM  Result Value Ref Range   HIV Screen 4th Generation wRfx Non Reactive Non Reactive    Comment: (NOTE) Performed At: [facility] [place], [place] [id number] Rush Farmer MD [id number] Performed at [facility], [address] [place], [place] [address]   Comprehensive metabolic panel     Status: Abnormal   Collection Time: 10/17/17  1:45 PM  Result Value Ref Range   Sodium 133 (L) 135 - 145 mmol/L   Potassium 3.7 3.5 - 5.1 mmol/L   Chloride 98 (L) 101 - 111 mmol/L   CO2 21 (L) 22 - 32 mmol/L   Glucose, Bld 153 (H) 65 - 99 mg/dL   BUN 22 (H) 6 - 20 mg/dL   Creatinine, Ser 0.96 0.61 - 1.24 mg/dL   Calcium 9.5 8.9 - 10.3 mg/dL   Total Protein 7.6 6.5 - 8.1 g/dL   Albumin 3.8 3.5 - 5.0 g/dL   AST 21 15 - 41 U/L   ALT 28 17 - 63 U/L   Alkaline Phosphatase 50 38 - 126 U/L   Total Bilirubin  1.8 (H) 0.3 - 1.2 mg/dL   GFR calc non Af Amer >60 >60 mL/min   GFR calc Af Amer >60 >60 mL/min    Comment: (NOTE) The eGFR has been calculated using the CKD EPI equation. This calculation has not been validated in all clinical situations. eGFR's persistently <60 mL/min signify possible Chronic Kidney Disease.    Anion gap 14 5 - 15    Comment: Performed at [facility], [address], [place], [place] [address]  CBC WITH DIFFERENTIAL     Status: Abnormal   Collection Time: 10/17/17  1:45 PM  Result Value Ref Range   WBC 5.6 4.0 - 10.5 K/uL   RBC 5.90 (H) 4.22 - 5.81 MIL/uL   Hemoglobin 15.6 13.0 - 17.0 g/dL   HCT 48.8 39.0 - 52.0 %   MCV 82.7 78.0 - 100.0 fL   MCH 26.4 26.0 - 34.0 pg   MCHC 32.0 30.0 - 36.0 g/dL   RDW 16.6 (H) 11.5 - 15.5 %   Platelets 88 (L) 150 - 400 K/uL    Comment: PLATELET COUNT CONFIRMED BY SMEAR SPECIMEN CHECKED FOR CLOTS    Neutrophils Relative % 79 %   Neutro Abs 4.5 1.7 - 7.7 K/uL   Lymphocytes Relative 11 %   Lymphs Abs 0.6 (L)  0.7 - 4.0 K/uL   Monocytes Relative 7 %   Monocytes Absolute 0.4 0.1 - 1.0 K/uL   Eosinophils Relative 3 %   Eosinophils Absolute 0.1 0.0 - 0.7 K/uL   Basophils Relative 0 %   Basophils Absolute 0.0 0.0 - 0.1 K/uL    Comment: Performed at [facility], [address], [place], [place] [address]  Mycoplasma pneumoniae antibody, IgM     Status: None   Collection Time: 10/17/17  1:45 PM  Result Value Ref Range   Mycoplasma pneumo IgM <770 0 - 769 U/mL    Comment: (NOTE)                             Negative            <770 Clinically significant amount of M. pneumoniae antibody not detected.                             Low Positive   770 - 97 M. pneumoniae specific IgM presumptively detected.  It is recommended that another sample be collected 1-2 weeks later to assure reactivity.                             Positive            >950 Highly significant amount of M. pneumoniae specific IgM antibody detected. Performed  At: [facility], [place] [id number] Rush Farmer MD [id number] Performed at [facility], [address], [place], [place] [address]   Culture, blood (Routine X 2) w Reflex to ID Panel     Status: None (Preliminary result)   Collection Time: 10/17/17  1:47 PM  Result Value Ref Range   Specimen Description BLOOD LEFT ARM    Special Requests      BOTTLES DRAWN AEROBIC AND ANAEROBIC Blood Culture adequate volume   Culture      NO GROWTH 2 DAYS Performed at [facility], [address] [place], [place] [address]    Report Status PENDING   Culture, blood (Routine X 2) w Reflex to ID Panel     Status: None (Preliminary result)   Collection Time: 10/17/17  1:47 PM  Result Value Ref Range   Specimen Description BLOOD RIGHT ARM    Special Requests      BOTTLES DRAWN AEROBIC AND ANAEROBIC Blood Culture adequate volume   Culture      NO GROWTH 2 DAYS Performed at [facility], [address] [place], [place] [address]    Report Status PENDING   Glucose, capillary     Status: Abnormal   Collection Time: 10/17/17  6:57 PM  Result Value Ref Range   Glucose-Capillary 189 (H) 65 - 99 mg/dL  Glucose, capillary     Status: Abnormal   Collection Time: 10/17/17  8:51 PM  Result Value Ref Range   Glucose-Capillary 243 (H) 65 - 99 mg/dL   Comment 1 Notify RN    Comment 2 Document in Chart   Glucose, capillary     Status: Abnormal   Collection Time: 10/18/17  7:46 AM  Result Value Ref Range   Glucose-Capillary 161 (H) 65 - 99 mg/dL  Glucose, capillary     Status: Abnormal   Collection Time: 10/18/17 11:24 AM  Result Value Ref Range   Glucose-Capillary 250 (H) 65 -  99 mg/dL  Glucose, capillary     Status: Abnormal   Collection Time: 10/18/17  4:28 PM  Result Value Ref Range   Glucose-Capillary 229 (H) 65 - 99 mg/dL  Glucose, capillary     Status: Abnormal   Collection Time: 10/18/17  9:53 PM  Result Value Ref Range   Glucose-Capillary 351 (H) 65 - 99  mg/dL   Comment 1 Notify RN    Comment 2 Document in Chart   Glucose, capillary     Status: Abnormal   Collection Time: 10/19/17  7:51 AM  Result Value Ref Range   Glucose-Capillary 228 (H) 65 - 99 mg/dL    ABGS No results for input(s): PHART, PO2ART, TCO2, HCO3 in the last 72 hours.  Invalid input(s): PCO2 CULTURES Recent Results (from the past 240 hour(s))  Culture, blood (Routine X 2) w Reflex to ID Panel     Status: None (Preliminary result)   Collection Time: 10/17/17  1:47 PM  Result Value Ref Range Status   Specimen Description BLOOD LEFT ARM  Final   Special Requests   Final    BOTTLES DRAWN AEROBIC AND ANAEROBIC Blood Culture adequate volume   Culture   Final    NO GROWTH 2 DAYS Performed at [facility], [address], [place], [place] [address]    Report Status PENDING  Incomplete  Culture, blood (Routine X 2) w Reflex to ID Panel     Status: None (Preliminary result)   Collection Time: 10/17/17  1:47 PM  Result Value Ref Range Status   Specimen Description BLOOD RIGHT ARM  Final   Special Requests   Final    BOTTLES DRAWN AEROBIC AND ANAEROBIC Blood Culture adequate volume   Culture   Final    NO GROWTH 2 DAYS Performed at [facility], [address] [place], [place] [address]    Report Status PENDING  Incomplete   Studies/Results: X-ray Chest Pa And Lateral  Result Date: 10/17/2017 CLINICAL DATA:  Fever or cough, asthma, COPD, diabetes mellitus, hypertension, former smoker, GERD EXAM: CHEST - 2 VIEW COMPARISON:  10/07/2017 FINDINGS: Normal heart size, mediastinal contours, and pulmonary vascularity. Calcified granulomata LEFT upper lobe. Chronic accentuation of inferior RIGHT hilum unchanged. Emphysematous and bronchitic changes with chronic accentuation of interstitial markings in the mid RIGHT lung, little changed when accounting for differences in technique. No definite acute infiltrate, pleural effusion or pneumothorax. Bones demineralized with persistent  compression deformity of a vertebra at the thoracolumbar junction. IMPRESSION: Changes of COPD and old granulomatous disease. Chronic inferior RIGHT hilar prominence. No acute abnormalities. Electronically Signed   By: Lavonia Dana M.D.   On: 10/17/2017 13:39    Medications:  Prior to Admission:  Medications Prior to Admission  Medication Sig Dispense Refill Last Dose  . acetaminophen (TYLENOL) 500 MG tablet Take 1,000 mg by mouth daily as needed for headache.   10/16/2017 at Unknown time  . ALPRAZolam (XANAX) 0.25 MG tablet Take 0.25 mg by mouth 2 (two) times daily.    10/16/2017 at Unknown time  . Apple Cider Vinegar 600 MG CAPS Take 2 capsules by mouth daily.   10/16/2017 at Unknown time  . Artificial Tear Ointment (REFRESH P.M. OP) Apply 1 application to eye daily as needed (dry eye). As directed    unknown  . aspirin EC 81 MG tablet Take 81 mg by mouth at bedtime.    10/16/2017 at Unknown time  . atorvastatin (LIPITOR) 40 MG tablet Take 1 tablet (40 mg total)  by mouth daily. 90 tablet 3 10/16/2017 at Unknown time  . Choline Fenofibrate (FENOFIBRIC ACID) 135 MG CPDR Take 1 capsule by mouth daily. 90 capsule 3 10/16/2017 at Unknown time  . clotrimazole-betamethasone (LOTRISONE) cream Apply 1 application topically 2 (two) times daily as needed (psoriasis).   unknown  . diltiazem (CARDIZEM CD) 240 MG 24 hr capsule Take 1 capsule (240 mg total) by mouth daily. 90 capsule 3 10/16/2017 at Unknown time  . fluticasone (FLONASE) 50 MCG/ACT nasal spray Place 2 sprays into both nostrils daily as needed for allergies.    unknown  . gabapentin (NEURONTIN) 300 MG capsule Take 1 capsule by mouth at bedtime.  5 Past Week at Unknown time  . HYDROcodone-acetaminophen (NORCO/VICODIN) 5-325 MG per tablet Take 1 tablet by mouth every 8 (eight) hours as needed for moderate pain. (Patient taking differently: Take 1 tablet by mouth daily as needed for moderate pain. ) 90 tablet 0 10/16/2017 at Unknown time  . JARDIANCE 25 MG  TABS tablet Take 25 mg by mouth every evening.   3 10/16/2017 at Unknown time  . Multiple Vitamin (MULITIVITAMIN WITH MINERALS) TABS Take 1 tablet by mouth at bedtime.    10/16/2017 at Unknown time  . pantoprazole (PROTONIX) 40 MG tablet Take 1 tablet (40 mg total) by mouth daily. 90 tablet 3 10/16/2017 at Unknown time  . PROAIR HFA 108 (90 Base) MCG/ACT inhaler Inhale 2 puffs into the lungs every 4 (four) hours as needed for wheezing or shortness of breath.   12 10/16/2017 at Unknown time  . SitaGLIPtin-MetFORMIN HCl (JANUMET XR) [phone] MG TB24 Take 1 tablet by mouth daily.   10/16/2017 at Unknown time  . tamsulosin (FLOMAX) 0.4 MG CAPS capsule Take 0.4 mg by mouth daily.   10/16/2017 at Unknown time  . tiotropium (SPIRIVA) 18 MCG inhalation capsule Place 18 mcg into inhaler and inhale daily.   10/16/2017 at Unknown time   Scheduled: . ALPRAZolam  0.25 mg Oral BID  . aspirin EC  81 mg Oral QHS  . atorvastatin  40 mg Oral Daily  . diltiazem  240 mg Oral Daily  . enoxaparin (LOVENOX) injection  40 mg Subcutaneous Q24H  . fenofibrate  160 mg Oral Daily  . gabapentin  300 mg Oral QHS  . guaiFENesin  600 mg Oral BID  . insulin aspart  0-20 Units Subcutaneous TID WC  . insulin aspart  0-5 Units Subcutaneous QHS  . ipratropium-albuterol  3 mL Nebulization TID  . mouth rinse  15 mL Mouth Rinse BID  . methylPREDNISolone (SOLU-MEDROL) injection  40 mg Intravenous Q12H  . multivitamin with minerals  1 tablet Oral QHS  . pantoprazole  40 mg Oral Daily  . tamsulosin  0.4 mg Oral Daily   Continuous: . sodium chloride 100 mL/hr at 10/19/17 0310  . piperacillin-tazobactam (ZOSYN)  IV Stopped (10/19/17 8250)  . vancomycin Stopped (10/19/17 0410)   IBB:[id number] **OR** acetaminophen, albuterol, fluticasone, HYDROcodone-acetaminophen, ondansetron **OR** ondansetron (ZOFRAN) IV, traZODone  Assesment: He was admitted with febrile illness.  It is not clear what that was from presumably from lung origin.   He is not had any fever since admission but he is on IV steroids  He has chronic changes on his chest x-ray which I have personally reviewed but nothing that looks acute.  He has severe COPD at baseline and is being treated for COPD exacerbation  He has diabetes and is being treated with sliding scale insulin  He has anxiety which is better  Active Problems:   Febrile illness, acute    Plan: Continue treatments.  I think he will probably be ready for discharge on the 15th    LOS: 2 days   Dorinne Graeff L 10/19/2017, 10:01 AM

## 2017-10-20 LAB — GLUCOSE, CAPILLARY
GLUCOSE-CAPILLARY: 297 mg/dL — AB (ref 65–99)
GLUCOSE-CAPILLARY: 235 mg/dL — AB (ref 65–99)
GLUCOSE-CAPILLARY: 356 mg/dL — AB (ref 65–99)
Glucose-Capillary: 184 mg/dL — ABNORMAL HIGH (ref 65–99)

## 2017-10-20 MED ORDER — INSULIN GLARGINE 100 UNIT/ML ~~LOC~~ SOLN
10.0000 [IU] | Freq: Every day | SUBCUTANEOUS | Status: DC
  Administered 2017-10-20 – 2017-10-21 (×2): 10 [IU] via SUBCUTANEOUS
  Filled 2017-10-20 (×3): qty 0.1

## 2017-10-20 NOTE — Progress Notes (Signed)
Subjective: He says he feels better.  No new complaints.  He still has cough but it is nonproductive.  His weakness is better.  Objective: Vital signs in last 24 hours: Temp:  [97.6 F (36.4 C)-97.7 F (36.5 C)] 97.6 F (36.4 C) (04/14 0534) Pulse Rate:  [97-101] 97 (04/14 0534) Resp:  [19-20] 20 (04/14 0534) BP: (95-110)/(58-66) 103/66 (04/14 0534) SpO2:  [89 %-95 %] 89 % (04/14 0737) Weight change:  Last BM Date: 10/19/17  Intake/Output from previous day: 04/13 0701 - 04/14 0700 In: 4278.3 [P.O.:1200; I.V.:2628.3; IV Piggyback:450] Out: -   PHYSICAL EXAM General appearance: alert, cooperative and no distress Resp: clear to auscultation bilaterally Cardio: regular rate and rhythm, S1, S2 normal, no murmur, click, rub or gallop GI: soft, non-tender; bowel sounds normal; no masses,  no organomegaly Extremities: extremities normal, atraumatic, no cyanosis or edema  Lab Results:  Results for orders placed or performed during the hospital encounter of 10/17/17 (from the past 48 hour(s))  Glucose, capillary     Status: Abnormal   Collection Time: 10/18/17 11:24 AM  Result Value Ref Range   Glucose-Capillary 250 (H) 65 - 99 mg/dL  Glucose, capillary     Status: Abnormal   Collection Time: 10/18/17  4:28 PM  Result Value Ref Range   Glucose-Capillary 229 (H) 65 - 99 mg/dL  Glucose, capillary     Status: Abnormal   Collection Time: 10/18/17  9:53 PM  Result Value Ref Range   Glucose-Capillary 351 (H) 65 - 99 mg/dL   Comment 1 Notify RN    Comment 2 Document in Chart   Glucose, capillary     Status: Abnormal   Collection Time: 10/19/17  7:51 AM  Result Value Ref Range   Glucose-Capillary 228 (H) 65 - 99 mg/dL  Glucose, capillary     Status: Abnormal   Collection Time: 10/19/17 12:07 PM  Result Value Ref Range   Glucose-Capillary 228 (H) 65 - 99 mg/dL  Glucose, capillary     Status: Abnormal   Collection Time: 10/19/17  4:44 PM  Result Value Ref Range   Glucose-Capillary  259 (H) 65 - 99 mg/dL  Glucose, capillary     Status: Abnormal   Collection Time: 10/19/17  8:46 PM  Result Value Ref Range   Glucose-Capillary 267 (H) 65 - 99 mg/dL  Glucose, capillary     Status: Abnormal   Collection Time: 10/20/17  7:32 AM  Result Value Ref Range   Glucose-Capillary 297 (H) 65 - 99 mg/dL    ABGS No results for input(s): PHART, PO2ART, TCO2, HCO3 in the last 72 hours.  Invalid input(s): PCO2 CULTURES Recent Results (from the past 240 hour(s))  Urine culture     Status: None   Collection Time: 10/17/17 12:51 PM  Result Value Ref Range Status   Specimen Description   Final    URINE, CLEAN CATCH Performed at [facility], [address], [place], [place] [address]    Special Requests   Final    Normal Performed at [facility], [address] [place], [place] [address]    Culture   Final    NO GROWTH Performed at [facility], [place] [address] [place],  [address]    Report Status 10/19/2017 FINAL  Final  Culture, blood (Routine X 2) w Reflex to ID Panel     Status: None (Preliminary result)   Collection Time: 10/17/17  1:47 PM  Result Value Ref Range Status   Specimen Description  BLOOD LEFT ARM  Final   Special Requests   Final    BOTTLES DRAWN AEROBIC AND ANAEROBIC Blood Culture adequate volume   Culture   Final    NO GROWTH 3 DAYS Performed at [facility], [address], [place], [place] [address]    Report Status PENDING  Incomplete  Culture, blood (Routine X 2) w Reflex to ID Panel     Status: None (Preliminary result)   Collection Time: 10/17/17  1:47 PM  Result Value Ref Range Status   Specimen Description BLOOD RIGHT ARM  Final   Special Requests   Final    BOTTLES DRAWN AEROBIC AND ANAEROBIC Blood Culture adequate volume   Culture   Final    NO GROWTH 3 DAYS Performed at [facility], [address] [place], [place] [address]    Report Status PENDING  Incomplete   Studies/Results: No results found.  Medications:  Prior  to Admission:  Medications Prior to Admission  Medication Sig Dispense Refill Last Dose  . acetaminophen (TYLENOL) 500 MG tablet Take 1,000 mg by mouth daily as needed for headache.   10/16/2017 at Unknown time  . ALPRAZolam (XANAX) 0.25 MG tablet Take 0.25 mg by mouth 2 (two) times daily.    10/16/2017 at Unknown time  . Apple Cider Vinegar 600 MG CAPS Take 2 capsules by mouth daily.   10/16/2017 at Unknown time  . Artificial Tear Ointment (REFRESH P.M. OP) Apply 1 application to eye daily as needed (dry eye). As directed    unknown  . aspirin EC 81 MG tablet Take 81 mg by mouth at bedtime.    10/16/2017 at Unknown time  . atorvastatin (LIPITOR) 40 MG tablet Take 1 tablet (40 mg total) by mouth daily. 90 tablet 3 10/16/2017 at Unknown time  . Choline Fenofibrate (FENOFIBRIC ACID) 135 MG CPDR Take 1 capsule by mouth daily. 90 capsule 3 10/16/2017 at Unknown time  . clotrimazole-betamethasone (LOTRISONE) cream Apply 1 application topically 2 (two) times daily as needed (psoriasis).   unknown  . diltiazem (CARDIZEM CD) 240 MG 24 hr capsule Take 1 capsule (240 mg total) by mouth daily. 90 capsule 3 10/16/2017 at Unknown time  . fluticasone (FLONASE) 50 MCG/ACT nasal spray Place 2 sprays into both nostrils daily as needed for allergies.    unknown  . gabapentin (NEURONTIN) 300 MG capsule Take 1 capsule by mouth at bedtime.  5 Past Week at Unknown time  . HYDROcodone-acetaminophen (NORCO/VICODIN) 5-325 MG per tablet Take 1 tablet by mouth every 8 (eight) hours as needed for moderate pain. (Patient taking differently: Take 1 tablet by mouth daily as needed for moderate pain. ) 90 tablet 0 10/16/2017 at Unknown time  . JARDIANCE 25 MG TABS tablet Take 25 mg by mouth every evening.   3 10/16/2017 at Unknown time  . Multiple Vitamin (MULITIVITAMIN WITH MINERALS) TABS Take 1 tablet by mouth at bedtime.    10/16/2017 at Unknown time  . pantoprazole (PROTONIX) 40 MG tablet Take 1 tablet (40 mg total) by mouth daily. 90  tablet 3 10/16/2017 at Unknown time  . PROAIR HFA 108 (90 Base) MCG/ACT inhaler Inhale 2 puffs into the lungs every 4 (four) hours as needed for wheezing or shortness of breath.   12 10/16/2017 at Unknown time  . SitaGLIPtin-MetFORMIN HCl (JANUMET XR) [phone] MG TB24 Take 1 tablet by mouth daily.   10/16/2017 at Unknown time  . tamsulosin (FLOMAX) 0.4 MG CAPS capsule Take 0.4 mg by mouth daily.  10/16/2017 at Unknown time  . tiotropium (SPIRIVA) 18 MCG inhalation capsule Place 18 mcg into inhaler and inhale daily.   10/16/2017 at Unknown time   Scheduled: . ALPRAZolam  0.25 mg Oral BID  . aspirin EC  81 mg Oral QHS  . atorvastatin  40 mg Oral Daily  . diltiazem  240 mg Oral Daily  . enoxaparin (LOVENOX) injection  40 mg Subcutaneous Q24H  . fenofibrate  160 mg Oral Daily  . gabapentin  300 mg Oral QHS  . guaiFENesin  600 mg Oral BID  . insulin aspart  0-20 Units Subcutaneous TID WC  . insulin aspart  0-5 Units Subcutaneous QHS  . ipratropium-albuterol  3 mL Nebulization TID  . mouth rinse  15 mL Mouth Rinse BID  . methylPREDNISolone (SOLU-MEDROL) injection  40 mg Intravenous Q12H  . multivitamin with minerals  1 tablet Oral QHS  . pantoprazole  40 mg Oral Daily  . tamsulosin  0.4 mg Oral Daily   Continuous: . sodium chloride 100 mL/hr at 10/20/17 0132  . piperacillin-tazobactam (ZOSYN)  IV 3.375 g (10/20/17 0536)  . vancomycin Stopped (10/20/17 0445)   EQA:[id number] **OR** acetaminophen, albuterol, fluticasone, HYDROcodone-acetaminophen, ondansetron **OR** ondansetron (ZOFRAN) IV, traZODone  Assesment: He was admitted with febrile illness and COPD exacerbation with associated dehydration.  He is much improved.  He had failed outpatient treatment. Active Problems:   HTN (hypertension)   Type 2 diabetes mellitus without complication, without long-term current use of insulin ([name])   Febrile illness, acute   COPD exacerbation ([place])   Dehydration    Plan: To new current  treatments probable discharge in the morning    LOS: 3 days   Ladislao Cohenour L 10/20/2017, 9:27 AM

## 2017-10-20 NOTE — Progress Notes (Signed)
Pharmacy Antibiotic Note  [name] is a 62 y.o. male admitted on 10/17/2017 with pneumonia.  Pharmacy has been consulted for Vancomycin and zosyn dosing.  Plan: Vancomycin 1036m loading dose, then 7559mIV every 12 hours.  Goal trough 15-20 mcg/mL. Zosyn 3.375g IV q8h (4 hour infusion).  F/U cxs and clinical progress Monitor V/S, labs, and levels as indicated Potential discharge 4/15  Height: 5' 7"  (170.2 cm) Weight: 145 lb (65.8 kg) IBW/kg (Calculated) : 66.1  Temp (24hrs), Avg:97.6 F (36.4 C), Min:97.6 F (36.4 C), Max:97.7 F (36.5 C)  Recent Labs  Lab 10/17/17 1345  WBC 5.6  CREATININE 0.96    Estimated Creatinine Clearance: 74.3 mL/min (by C-G formula based on SCr of 0.96 mg/dL).    Allergies  Allergen Reactions  . Codeine Rash  . Baclofen     lethargic   . Simvastatin Nausea Only    Antimicrobials this admission: Vancomycin 4/11 >>  Zosyn 4/11 >>   Dose adjustments this admission: N/A  Microbiology results: 4/11 BCx: NG x 3 days 4/11 UCx: NG final 4/11 AFB Sputum: TBC  Thank you for allowing pharmacy to be a part of this patient's care.  StMargot AblesPharmD Clinical Pharmacist 10/20/2017 10:17 AM

## 2017-10-21 LAB — CREATININE, SERUM
CREATININE: 1.06 mg/dL (ref 0.61–1.24)
GFR calc Af Amer: >60 mL/min (ref >60)
GFR calc non Af Amer: >60 mL/min (ref >60)

## 2017-10-21 LAB — GLUCOSE, CAPILLARY: Glucose-Capillary: 229 mg/dL — ABNORMAL HIGH (ref 65–99)

## 2017-10-21 MED ORDER — PREDNISONE 10 MG PO TABS: ORAL_TABLET | ORAL | 0 refills | Status: DC

## 2017-10-21 MED ORDER — CEFDINIR 300 MG PO CAPS: 300.0000 mg | ORAL_CAPSULE | Freq: Two times a day (BID) | ORAL | 0 refills | Status: DC

## 2017-10-21 NOTE — Progress Notes (Signed)
[name] discharged Home per MD order.  Discharge instructions reviewed and discussed with the patient, all questions and concerns answered. Copy of instructions and scripts given to patient.  Allergies as of 10/21/2017      Reactions   Codeine Rash   Baclofen    lethargic    Simvastatin Nausea Only      Medication List    TAKE these medications   acetaminophen 500 MG tablet Commonly known as:  TYLENOL Take 1,000 mg by mouth daily as needed for headache.   ALPRAZolam 0.25 MG tablet Commonly known as:  XANAX Take 0.25 mg by mouth 2 (two) times daily.   Apple Cider Vinegar 600 MG Caps Take 2 capsules by mouth daily.   aspirin EC 81 MG tablet Take 81 mg by mouth at bedtime.   atorvastatin 40 MG tablet Commonly known as:  LIPITOR Take 1 tablet (40 mg total) by mouth daily.   cefdinir 300 MG capsule Commonly known as:  OMNICEF Take 1 capsule (300 mg total) by mouth 2 (two) times daily.   clotrimazole-betamethasone cream Commonly known as:  LOTRISONE Apply 1 application topically 2 (two) times daily as needed (psoriasis).   diltiazem 240 MG 24 hr capsule Commonly known as:  CARDIZEM CD Take 1 capsule (240 mg total) by mouth daily.   Fenofibric Acid 135 MG Cpdr Take 1 capsule by mouth daily.   fluticasone 50 MCG/ACT nasal spray Commonly known as:  FLONASE Place 2 sprays into both nostrils daily as needed for allergies.   gabapentin 300 MG capsule Commonly known as:  NEURONTIN Take 1 capsule by mouth at bedtime.   HYDROcodone-acetaminophen 5-325 MG tablet Commonly known as:  NORCO/VICODIN Take 1 tablet by mouth every 8 (eight) hours as needed for moderate pain. What changed:  when to take this   JANUMET XR [phone] MG Tb24 Generic drug:  SitaGLIPtin-MetFORMIN HCl Take 1 tablet by mouth daily.   JARDIANCE 25 MG Tabs tablet Generic drug:  empagliflozin Take 25 mg by mouth every evening.   multivitamin with minerals Tabs tablet Take 1 tablet by mouth at  bedtime.   pantoprazole 40 MG tablet Commonly known as:  PROTONIX Take 1 tablet (40 mg total) by mouth daily.   predniSONE 10 MG tablet Commonly known as:  DELTASONE 96fr 1 day,3x1d,2x1d,1x1d   PROAIR HFA 108 (90 Base) MCG/ACT inhaler Generic drug:  albuterol Inhale 2 puffs into the lungs every 4 (four) hours as needed for wheezing or shortness of breath.   REFRESH P.M. OP Apply 1 application to eye daily as needed (dry eye). As directed   tamsulosin 0.4 MG Caps capsule Commonly known as:  FLOMAX Take 0.4 mg by mouth daily.   tiotropium 18 MCG inhalation capsule Commonly known as:  SPIRIVA Place 18 mcg into inhaler and inhale daily.       Patients skin is clean, dry and intact, no evidence of skin break down. IV site discontinued and catheter remains intact. Site without signs and symptoms of complications. Dressing and pressure applied.  Patient escorted to car by NT in a wheelchair,  no distress noted upon discharge.  [name] 10/21/2017 10:54 AM

## 2017-10-21 NOTE — Care Management Important Message (Signed)
Important Message  Patient Details  Name: [name] MRN: [id number] Date of Birth: 1956/05/01   Medicare Important Message Given:  Yes    Sherald Barge, RN 10/21/2017, 9:17 AM

## 2017-10-21 NOTE — Progress Notes (Signed)
Inpatient Diabetes Program Recommendations  AACE/ADA: New Consensus Statement on Inpatient Glycemic Control (2015)  Target Ranges:  Prepandial:   less than 140 mg/dL      Peak postprandial:   less than 180 mg/dL (1-2 hours)      Critically ill patients:  140 - 180 mg/dL   Results for [name], [name] (MRN [id number]) as of 10/21/2017 07:52  Ref. Range 10/20/2017 07:32 10/20/2017 11:28 10/20/2017 16:01 10/20/2017 20:53 10/21/2017 07:43  Glucose-Capillary Latest Ref Range: 65 - 99 mg/dL 297 (H) 235 (H) 184 (H) 356 (H) 229 (H)   Review of Glycemic Control  Diabetes history: DM2 Outpatient Diabetes medications: Janumet XR [phone] mg daily Current orders for Inpatient glycemic control: Lantus 10 units daily, Novolog 0-20 units TID with meals, Novolog 0-5 units QHS; Solumedrol 40 mg Q12H  Inpatient Diabetes Program Recommendations:  Insulin - Basal: If steroids are continued as ordered and patient is not discharged home today, please consider increasing Lantus to 15 units daily. Insulin - Meal Coverage: If steroids are continued as ordered and patient is not discharged home today, please consider ordering Novolog 3 units TID with meals for meal coverage if patient eats at least 50% of meals.  Thanks, Barnie Alderman, RN, MSN, CDE Diabetes Coordinator Inpatient Diabetes Program [phone] (Team Pager from 8am to 5pm)

## 2017-10-21 NOTE — Plan of Care (Signed)
Pt is adequate for discharge.

## 2017-10-21 NOTE — Plan of Care (Signed)
Adequate for discharge.

## 2017-10-21 NOTE — Progress Notes (Signed)
Subjective: He says he feels much better and wants to go home.  He has no new complaints.  His blood sugar has been up presumably from the steroids  Objective: Vital signs in last 24 hours: Temp:  [97.6 F (36.4 C)-97.7 F (36.5 C)] 97.7 F (36.5 C) (04/15 0450) Pulse Rate:  [83-107] 83 (04/15 0450) Resp:  [17-19] 17 (04/15 0450) BP: (105-113)/(58-71) 113/71 (04/15 0450) SpO2:  [92 %-100 %] 95 % (04/15 0826) Weight change:  Last BM Date: 10/20/17  Intake/Output from previous day: 04/14 0701 - 04/15 0700 In: 2650.8 [P.O.:720; I.V.:1530.8; IV Piggyback:400] Out: -   PHYSICAL EXAM General appearance: alert, cooperative and no distress Resp: clear to auscultation bilaterally Cardio: regular rate and rhythm, S1, S2 normal, no murmur, click, rub or gallop GI: soft, non-tender; bowel sounds normal; no masses,  no organomegaly Extremities: extremities normal, atraumatic, no cyanosis or edema  Lab Results:  Results for orders placed or performed during the hospital encounter of 10/17/17 (from the past 48 hour(s))  Glucose, capillary     Status: Abnormal   Collection Time: 10/19/17 12:07 PM  Result Value Ref Range   Glucose-Capillary 228 (H) 65 - 99 mg/dL  Glucose, capillary     Status: Abnormal   Collection Time: 10/19/17  4:44 PM  Result Value Ref Range   Glucose-Capillary 259 (H) 65 - 99 mg/dL  Glucose, capillary     Status: Abnormal   Collection Time: 10/19/17  8:46 PM  Result Value Ref Range   Glucose-Capillary 267 (H) 65 - 99 mg/dL  Glucose, capillary     Status: Abnormal   Collection Time: 10/20/17  7:32 AM  Result Value Ref Range   Glucose-Capillary 297 (H) 65 - 99 mg/dL  Glucose, capillary     Status: Abnormal   Collection Time: 10/20/17 11:28 AM  Result Value Ref Range   Glucose-Capillary 235 (H) 65 - 99 mg/dL  Glucose, capillary     Status: Abnormal   Collection Time: 10/20/17  4:01 PM  Result Value Ref Range   Glucose-Capillary 184 (H) 65 - 99 mg/dL  Glucose,  capillary     Status: Abnormal   Collection Time: 10/20/17  8:53 PM  Result Value Ref Range   Glucose-Capillary 356 (H) 65 - 99 mg/dL   Comment 1 Notify RN    Comment 2 Document in Chart   Creatinine, serum     Status: None   Collection Time: 10/21/17  5:39 AM  Result Value Ref Range   Creatinine, Ser 1.06 0.61 - 1.24 mg/dL   GFR calc non Af Amer >60 >60 mL/min   GFR calc Af Amer >60 >60 mL/min    Comment: (NOTE) The eGFR has been calculated using the CKD EPI equation. This calculation has not been validated in all clinical situations. eGFR's persistently <60 mL/min signify possible Chronic Kidney Disease. Performed at [facility], [address], [place], [place] [address]   Glucose, capillary     Status: Abnormal   Collection Time: 10/21/17  7:43 AM  Result Value Ref Range   Glucose-Capillary 229 (H) 65 - 99 mg/dL   Comment 1 Notify RN    Comment 2 Document in Chart     ABGS No results for input(s): PHART, PO2ART, TCO2, HCO3 in the last 72 hours.  Invalid input(s): PCO2 CULTURES Recent Results (from the past 240 hour(s))  Urine culture     Status: None   Collection Time: 10/17/17 12:51 PM  Result Value Ref Range Status  Specimen Description   Final    URINE, CLEAN CATCH Performed at [facility], [address] [place], [place] [address]    Special Requests   Final    Normal Performed at [facility], [address], [place], [place] [address]    Culture   Final    NO GROWTH Performed at [facility], [place] [address], [place], [place] [address]    Report Status 10/19/2017 FINAL  Final  Culture, blood (Routine X 2) w Reflex to ID Panel     Status: None (Preliminary result)   Collection Time: 10/17/17  1:47 PM  Result Value Ref Range Status   Specimen Description BLOOD LEFT ARM  Final   Special Requests   Final    BOTTLES DRAWN AEROBIC AND ANAEROBIC Blood Culture adequate volume   Culture   Final    NO GROWTH 4 DAYS Performed at [facility], [address], [place], [place] [address]    Report Status PENDING  Incomplete  Culture, blood (Routine X 2) w Reflex to ID Panel     Status: None (Preliminary result)   Collection Time: 10/17/17  1:47 PM  Result Value Ref Range Status   Specimen Description BLOOD RIGHT ARM  Final   Special Requests   Final    BOTTLES DRAWN AEROBIC AND ANAEROBIC Blood Culture adequate volume   Culture   Final    NO GROWTH 4 DAYS Performed at [facility], [address] [place], [place] [address]    Report Status PENDING  Incomplete   Studies/Results: No results found.  Medications:  Prior to Admission:  Medications Prior to Admission  Medication Sig Dispense Refill Last Dose  . acetaminophen (TYLENOL) 500 MG tablet Take 1,000 mg by mouth daily as needed for headache.   10/16/2017 at Unknown time  . ALPRAZolam (XANAX) 0.25 MG tablet Take 0.25 mg by mouth 2 (two) times daily.    10/16/2017 at Unknown time  . Apple Cider Vinegar 600 MG CAPS Take 2 capsules by mouth daily.   10/16/2017 at Unknown time  . Artificial Tear Ointment (REFRESH P.M. OP) Apply 1 application to eye daily as needed (dry eye). As directed    unknown  . aspirin EC 81 MG tablet Take 81 mg by mouth at bedtime.    10/16/2017 at Unknown time  . atorvastatin (LIPITOR) 40 MG tablet Take 1 tablet (40 mg total) by mouth daily. 90 tablet 3 10/16/2017 at Unknown time  . Choline Fenofibrate (FENOFIBRIC ACID) 135 MG CPDR Take 1 capsule by mouth daily. 90 capsule 3 10/16/2017 at Unknown time  . clotrimazole-betamethasone (LOTRISONE) cream Apply 1 application topically 2 (two) times daily as needed (psoriasis).   unknown  . diltiazem (CARDIZEM CD) 240 MG 24 hr capsule Take 1 capsule (240 mg total) by mouth daily. 90 capsule 3 10/16/2017 at Unknown time  . fluticasone (FLONASE) 50 MCG/ACT nasal spray Place 2 sprays into both nostrils daily as needed for allergies.    unknown  . gabapentin (NEURONTIN) 300 MG capsule Take 1 capsule by mouth at bedtime.  5 Past Week  at Unknown time  . HYDROcodone-acetaminophen (NORCO/VICODIN) 5-325 MG per tablet Take 1 tablet by mouth every 8 (eight) hours as needed for moderate pain. (Patient taking differently: Take 1 tablet by mouth daily as needed for moderate pain. ) 90 tablet 0 10/16/2017 at Unknown time  . JARDIANCE 25 MG TABS tablet Take 25 mg by mouth every evening.   3 10/16/2017 at Unknown  time  . Multiple Vitamin (MULITIVITAMIN WITH MINERALS) TABS Take 1 tablet by mouth at bedtime.    10/16/2017 at Unknown time  . pantoprazole (PROTONIX) 40 MG tablet Take 1 tablet (40 mg total) by mouth daily. 90 tablet 3 10/16/2017 at Unknown time  . PROAIR HFA 108 (90 Base) MCG/ACT inhaler Inhale 2 puffs into the lungs every 4 (four) hours as needed for wheezing or shortness of breath.   12 10/16/2017 at Unknown time  . SitaGLIPtin-MetFORMIN HCl (JANUMET XR) [phone] MG TB24 Take 1 tablet by mouth daily.   10/16/2017 at Unknown time  . tamsulosin (FLOMAX) 0.4 MG CAPS capsule Take 0.4 mg by mouth daily.   10/16/2017 at Unknown time  . tiotropium (SPIRIVA) 18 MCG inhalation capsule Place 18 mcg into inhaler and inhale daily.   10/16/2017 at Unknown time   Scheduled: . ALPRAZolam  0.25 mg Oral BID  . aspirin EC  81 mg Oral QHS  . atorvastatin  40 mg Oral Daily  . diltiazem  240 mg Oral Daily  . enoxaparin (LOVENOX) injection  40 mg Subcutaneous Q24H  . fenofibrate  160 mg Oral Daily  . gabapentin  300 mg Oral QHS  . guaiFENesin  600 mg Oral BID  . insulin aspart  0-20 Units Subcutaneous TID WC  . insulin aspart  0-5 Units Subcutaneous QHS  . insulin glargine  10 Units Subcutaneous Daily  . ipratropium-albuterol  3 mL Nebulization TID  . mouth rinse  15 mL Mouth Rinse BID  . methylPREDNISolone (SOLU-MEDROL) injection  40 mg Intravenous Q12H  . multivitamin with minerals  1 tablet Oral QHS  . pantoprazole  40 mg Oral Daily  . tamsulosin  0.4 mg Oral Daily   Continuous: . sodium chloride 50 mL/hr at 10/20/17 1512  .  piperacillin-tazobactam (ZOSYN)  IV 3.375 g (10/21/17 0545)  . vancomycin Stopped (10/21/17 0546)   MAU:[id number] **OR** acetaminophen, albuterol, fluticasone, HYDROcodone-acetaminophen, ondansetron **OR** ondansetron (ZOFRAN) IV, traZODone  Assesment: He was admitted with febrile illness and COPD exacerbation.  He is markedly improved.  He did not respond to outpatient treatment.  He was dehydrated on admission which is better.  He has not had any fever.  He has diabetes and his blood sugar has been up from the IV steroids he is ready for discharge Active Problems:   HTN (hypertension)   Type 2 diabetes mellitus without complication, without long-term current use of insulin ([name])   Febrile illness, acute   COPD exacerbation ([place])   Dehydration    Plan: Discharge home today    LOS: 4 days   [name] 10/21/2017, 8:51 AM

## 2017-10-21 NOTE — Discharge Summary (Signed)
Physician Discharge Summary  Patient ID: ANASTASIO WOGAN MRN: [id number] DOB/AGE: 62-Jul-1957 62 y.o. Primary Care Physician:Kathlyne Loud, Percell Miller, MD Admit date: 10/17/2017 Discharge date: 10/21/2017    Discharge Diagnoses:   Active Problems:   HTN (hypertension)   Type 2 diabetes mellitus without complication, without long-term current use of insulin ([name])   Febrile illness, acute   COPD exacerbation ([name])   Dehydration   Allergies as of 10/21/2017      Reactions   Codeine Rash   Baclofen    lethargic    Simvastatin Nausea Only      Medication List    TAKE these medications   acetaminophen 500 MG tablet Commonly known as:  TYLENOL Take 1,000 mg by mouth daily as needed for headache.   ALPRAZolam 0.25 MG tablet Commonly known as:  XANAX Take 0.25 mg by mouth 2 (two) times daily.   Apple Cider Vinegar 600 MG Caps Take 2 capsules by mouth daily.   aspirin EC 81 MG tablet Take 81 mg by mouth at bedtime.   atorvastatin 40 MG tablet Commonly known as:  LIPITOR Take 1 tablet (40 mg total) by mouth daily.   cefdinir 300 MG capsule Commonly known as:  OMNICEF Take 1 capsule (300 mg total) by mouth 2 (two) times daily.   clotrimazole-betamethasone cream Commonly known as:  LOTRISONE Apply 1 application topically 2 (two) times daily as needed (psoriasis).   diltiazem 240 MG 24 hr capsule Commonly known as:  CARDIZEM CD Take 1 capsule (240 mg total) by mouth daily.   Fenofibric Acid 135 MG Cpdr Take 1 capsule by mouth daily.   fluticasone 50 MCG/ACT nasal spray Commonly known as:  FLONASE Place 2 sprays into both nostrils daily as needed for allergies.   gabapentin 300 MG capsule Commonly known as:  NEURONTIN Take 1 capsule by mouth at bedtime.   HYDROcodone-acetaminophen 5-325 MG tablet Commonly known as:  NORCO/VICODIN Take 1 tablet by mouth every 8 (eight) hours as needed for moderate pain. What changed:  when to take this   JANUMET XR [phone] MG  Tb24 Generic drug:  SitaGLIPtin-MetFORMIN HCl Take 1 tablet by mouth daily.   JARDIANCE 25 MG Tabs tablet Generic drug:  empagliflozin Take 25 mg by mouth every evening.   multivitamin with minerals Tabs tablet Take 1 tablet by mouth at bedtime.   pantoprazole 40 MG tablet Commonly known as:  PROTONIX Take 1 tablet (40 mg total) by mouth daily.   predniSONE 10 MG tablet Commonly known as:  DELTASONE 66fr 1 day,3x1d,2x1d,1x1d   PROAIR HFA 108 (90 Base) MCG/ACT inhaler Generic drug:  albuterol Inhale 2 puffs into the lungs every 4 (four) hours as needed for wheezing or shortness of breath.   REFRESH P.M. OP Apply 1 application to eye daily as needed (dry eye). As directed   tamsulosin 0.4 MG Caps capsule Commonly known as:  FLOMAX Take 0.4 mg by mouth daily.   tiotropium 18 MCG inhalation capsule Commonly known as:  SPIRIVA Place 18 mcg into inhaler and inhale daily.       Discharged Condition: Improved    Consults: None  Significant Diagnostic Studies: X-ray Chest Pa And Lateral  Result Date: 10/17/2017 CLINICAL DATA:  Fever or cough, asthma, COPD, diabetes mellitus, hypertension, former smoker, GERD EXAM: CHEST - 2 VIEW COMPARISON:  10/07/2017 FINDINGS: Normal heart size, mediastinal contours, and pulmonary vascularity. Calcified granulomata LEFT upper lobe. Chronic accentuation of inferior RIGHT hilum unchanged. Emphysematous and bronchitic changes with chronic accentuation of interstitial  markings in the mid RIGHT lung, little changed when accounting for differences in technique. No definite acute infiltrate, pleural effusion or pneumothorax. Bones demineralized with persistent compression deformity of a vertebra at the thoracolumbar junction. IMPRESSION: Changes of COPD and old granulomatous disease. Chronic inferior RIGHT hilar prominence. No acute abnormalities. Electronically Signed   By: Lavonia Dana M.D.   On: 10/17/2017 13:39   Dg Chest 2 View  Result Date:  10/07/2017 CLINICAL DATA:  62 year old male with cough congestion shortness of breath and fever intermittently for 1 month. Currently on antibiotic for lung infection. EXAM: CHEST - 2 VIEW COMPARISON:  Low-dose screening chest CT 08/09/2017 and earlier. FINDINGS: Calcified right upper lobe granulomas redemonstrated. Mildly larger lung volumes than on 2015 radiographs. Mediastinal contours remain normal. No pneumothorax, pleural effusion or consolidation. However, there are asymmetric increased reticular interstitial opacities in both lungs since 2015, greater on the right and in the anterior right upper lobe. An anterior wedge compression fracture at the thoracolumbar junction was partially visible in February and appears chronic. Negative visible bowel gas pattern. IMPRESSION: 1. Increased asymmetric pulmonary interstitial opacity since 2015 suspicious for viral/atypical respiratory infection as suspected on the February CT. This is maximal in the right upper lobe. No pleural effusion or consolidation. 2. Otherwise chronic lung disease, and calcified right upper lobe granulomas. Electronically Signed   By: Genevie Ann M.D.   On: 10/07/2017 12:08    Lab Results: Basic Metabolic Panel: Recent Labs    10/21/17 0539  CREATININE 1.06   Liver Function Tests: No results for input(s): AST, ALT, ALKPHOS, BILITOT, PROT, ALBUMIN in the last 72 hours.   CBC: No results for input(s): WBC, NEUTROABS, HGB, HCT, MCV, PLT in the last 72 hours.  Recent Results (from the past 240 hour(s))  Urine culture     Status: None   Collection Time: 10/17/17 12:51 PM  Result Value Ref Range Status   Specimen Description   Final    URINE, CLEAN CATCH Performed at [facility], [address] [place], [place] [address]    Special Requests   Final    Normal Performed at [facility], [address], [place], [place] [address]    Culture   Final    NO GROWTH Performed at [facility], [place] [address] [place],  [place] [address]    Report Status 10/19/2017 FINAL  Final  Culture, blood (Routine X 2) w Reflex to ID Panel     Status: None (Preliminary result)   Collection Time: 10/17/17  1:47 PM  Result Value Ref Range Status   Specimen Description BLOOD LEFT ARM  Final   Special Requests   Final    BOTTLES DRAWN AEROBIC AND ANAEROBIC Blood Culture adequate volume   Culture   Final    NO GROWTH 4 DAYS Performed at [facility], [address], [place], [place] [address]    Report Status PENDING  Incomplete  Culture, blood (Routine X 2) w Reflex to ID Panel     Status: None (Preliminary result)   Collection Time: 10/17/17  1:47 PM  Result Value Ref Range Status   Specimen Description BLOOD RIGHT ARM  Final   Special Requests   Final    BOTTLES DRAWN AEROBIC AND ANAEROBIC Blood Culture adequate volume   Culture   Final    NO GROWTH 4 DAYS Performed at [facility], [address], [place], [place] [address]    Report Status PENDING  Incomplete  Hospital Course: This is a 62 year old who came to my office with an approximately 18-monthhistory of cough and congestion.  He is been treated on several occasions with antibiotics and steroids but has not improved.  He had temperature of 102 the night prior to coming to my office.  It was felt that he had failed outpatient treatment and he was admitted for IV medications.  Chest x-ray showed some chronic changes but nothing acute.  He seemed to be dehydrated and improved with IV fluids.  His blood sugar went up with IV steroids and he was treated with insulin.  By the time of discharge she was markedly improved.  Discharge Exam: Blood pressure 113/71, pulse 83, temperature 97.7 F (36.5 C), temperature source Oral, resp. rate 17, height 5' 7"  (1.702 m), weight 65.8 kg (145 lb), SpO2 95 %. He is awake and alert.  He looks comfortable.  His chest is clear.  Heart is regular.  Disposition: Home      Signed: Susi Goslin L   10/21/2017, 8:55 AM

## 2017-10-22 LAB — CULTURE, BLOOD (ROUTINE X 2)
CULTURE: NO GROWTH
CULTURE: NO GROWTH
SPECIAL REQUESTS: ADEQUATE
Special Requests: ADEQUATE

## 2017-10-24 ENCOUNTER — Other Ambulatory Visit: Payer: Self-pay

## 2017-10-24 NOTE — Patient Outreach (Signed)
[facility]) Care Management  10/24/2017  DEMARKUS REMMEL 31-Jan-1956 [id number]     EMMI-General Discharge RED ON EMMI ALERT Day # 1 Date: 10/23/17 Red Alert Reason: " Scheduled follow up? No"   Outreach attempt # 1 to patient. Spoke with patient who voices he is doing fairly well. Reviewed and addressed red alert with patient. He reports that his appts were made on yesterday. He has transportation to MD appts. He voices that he has all his meds and no issues or concerns regarding them. He reports that he wants a ne machine and has already mentioned it to his doctor and they are supposed to be working on it. He voices no other RN CM needs or concerns at this time. Advised patient that they would get one more automated EMMI-GENERAL post discharge calls to assess how they are doing following recent hospitalization and will receive a call from a nurse if any of their responses were abnormal. Patient voiced understanding and was appreciative of f/u call.       Plan: RN CM will close case as no further interventions needed at this time.   Enzo Montgomery, RN,BSN,CCM [facility] Telephonic Care Management Coordinator Direct Phone: [phone] Toll Free: [phone] Fax: [phone]

## 2017-10-25 ENCOUNTER — Other Ambulatory Visit: Payer: Self-pay

## 2017-10-25 ENCOUNTER — Encounter (HOSPITAL_COMMUNITY): Payer: Self-pay

## 2017-10-25 ENCOUNTER — Inpatient Hospital Stay (HOSPITAL_COMMUNITY): Payer: PPO

## 2017-10-25 ENCOUNTER — Emergency Department (HOSPITAL_COMMUNITY): Payer: PPO

## 2017-10-25 ENCOUNTER — Inpatient Hospital Stay (HOSPITAL_COMMUNITY)
Admission: EM | Admit: 2017-10-25 | Discharge: 2017-11-02 | DRG: 872 | Disposition: A | Discharge status: Home / Self Care | Payer: PPO | Attending: Pulmonary Disease | Admitting: Pulmonary Disease

## 2017-10-25 DIAGNOSIS — A419 Sepsis, unspecified organism: Principal | ICD-10-CM | POA: Diagnosis present

## 2017-10-25 DIAGNOSIS — Z87891 Personal history of nicotine dependence: Secondary | ICD-10-CM | POA: Diagnosis not present

## 2017-10-25 DIAGNOSIS — R0902 Hypoxemia: Secondary | ICD-10-CM | POA: Diagnosis not present

## 2017-10-25 DIAGNOSIS — E860 Dehydration: Secondary | ICD-10-CM | POA: Diagnosis not present

## 2017-10-25 DIAGNOSIS — E785 Hyperlipidemia, unspecified: Secondary | ICD-10-CM | POA: Diagnosis not present

## 2017-10-25 DIAGNOSIS — Z7982 Long term (current) use of aspirin: Secondary | ICD-10-CM

## 2017-10-25 DIAGNOSIS — K219 Gastro-esophageal reflux disease without esophagitis: Secondary | ICD-10-CM | POA: Diagnosis present

## 2017-10-25 DIAGNOSIS — E8809 Other disorders of plasma-protein metabolism, not elsewhere classified: Secondary | ICD-10-CM | POA: Diagnosis present

## 2017-10-25 DIAGNOSIS — I10 Essential (primary) hypertension: Secondary | ICD-10-CM | POA: Diagnosis not present

## 2017-10-25 DIAGNOSIS — Z79899 Other long term (current) drug therapy: Secondary | ICD-10-CM

## 2017-10-25 DIAGNOSIS — J189 Pneumonia, unspecified organism: Secondary | ICD-10-CM

## 2017-10-25 DIAGNOSIS — R05 Cough: Secondary | ICD-10-CM | POA: Diagnosis not present

## 2017-10-25 DIAGNOSIS — F419 Anxiety disorder, unspecified: Secondary | ICD-10-CM | POA: Diagnosis present

## 2017-10-25 DIAGNOSIS — Z7984 Long term (current) use of oral hypoglycemic drugs: Secondary | ICD-10-CM | POA: Diagnosis not present

## 2017-10-25 DIAGNOSIS — M6281 Muscle weakness (generalized): Secondary | ICD-10-CM | POA: Diagnosis present

## 2017-10-25 DIAGNOSIS — A310 Pulmonary mycobacterial infection: Secondary | ICD-10-CM | POA: Diagnosis not present

## 2017-10-25 DIAGNOSIS — J441 Chronic obstructive pulmonary disease with (acute) exacerbation: Secondary | ICD-10-CM | POA: Diagnosis not present

## 2017-10-25 DIAGNOSIS — Z7951 Long term (current) use of inhaled steroids: Secondary | ICD-10-CM

## 2017-10-25 DIAGNOSIS — E049 Nontoxic goiter, unspecified: Secondary | ICD-10-CM | POA: Diagnosis not present

## 2017-10-25 DIAGNOSIS — R161 Splenomegaly, not elsewhere classified: Secondary | ICD-10-CM | POA: Diagnosis present

## 2017-10-25 DIAGNOSIS — E778 Other disorders of glycoprotein metabolism: Secondary | ICD-10-CM | POA: Diagnosis present

## 2017-10-25 DIAGNOSIS — R0600 Dyspnea, unspecified: Secondary | ICD-10-CM | POA: Diagnosis not present

## 2017-10-25 DIAGNOSIS — K760 Fatty (change of) liver, not elsewhere classified: Secondary | ICD-10-CM | POA: Diagnosis not present

## 2017-10-25 DIAGNOSIS — R509 Fever, unspecified: Secondary | ICD-10-CM

## 2017-10-25 DIAGNOSIS — D696 Thrombocytopenia, unspecified: Secondary | ICD-10-CM | POA: Diagnosis present

## 2017-10-25 DIAGNOSIS — E119 Type 2 diabetes mellitus without complications: Secondary | ICD-10-CM | POA: Diagnosis not present

## 2017-10-25 DIAGNOSIS — A319 Mycobacterial infection, unspecified: Secondary | ICD-10-CM | POA: Diagnosis present

## 2017-10-25 DIAGNOSIS — R0602 Shortness of breath: Secondary | ICD-10-CM | POA: Diagnosis not present

## 2017-10-25 DIAGNOSIS — J449 Chronic obstructive pulmonary disease, unspecified: Secondary | ICD-10-CM | POA: Diagnosis not present

## 2017-10-25 LAB — CBC WITH DIFFERENTIAL/PLATELET
BASOS ABS: 0 10*3/uL (ref 0.0–0.1)
BASOS PCT: 0 %
EOS ABS: 0.1 10*3/uL (ref 0.0–0.7)
Eosinophils Relative: 2 %
HEMATOCRIT: 46.2 % (ref 39.0–52.0)
HEMOGLOBIN: 14.8 g/dL (ref 13.0–17.0)
Lymphocytes Relative: 11 %
Lymphs Abs: 0.6 10*3/uL — ABNORMAL LOW (ref 0.7–4.0)
MCH: 26.5 pg (ref 26.0–34.0)
MCHC: 32 g/dL (ref 30.0–36.0)
MCV: 82.8 fL (ref 78.0–100.0)
MONOS PCT: 5 %
Monocytes Absolute: 0.3 10*3/uL (ref 0.1–1.0)
NEUTROS ABS: 4.7 10*3/uL (ref 1.7–7.7)
NEUTROS PCT: 82 %
Platelets: 104 10*3/uL — ABNORMAL LOW (ref 150–400)
RBC: 5.58 MIL/uL (ref 4.22–5.81)
RDW: 17 % — ABNORMAL HIGH (ref 11.5–15.5)
WBC: 5.7 10*3/uL (ref 4.0–10.5)

## 2017-10-25 LAB — TSH: TSH: 2.935 u[IU]/mL (ref 0.350–4.500)

## 2017-10-25 LAB — COMPREHENSIVE METABOLIC PANEL
ALK PHOS: 76 U/L (ref 38–126)
ALT: 45 U/L (ref 17–63)
ANION GAP: 12 (ref 5–15)
AST: 23 U/L (ref 15–41)
Albumin: 3.6 g/dL (ref 3.5–5.0)
BILIRUBIN TOTAL: 1 mg/dL (ref 0.3–1.2)
BUN: 28 mg/dL — ABNORMAL HIGH (ref 6–20)
CALCIUM: 9.5 mg/dL (ref 8.9–10.3)
CO2: 25 mmol/L (ref 22–32)
CREATININE: 0.79 mg/dL (ref 0.61–1.24)
Chloride: 99 mmol/L — ABNORMAL LOW (ref 101–111)
Glucose, Bld: 149 mg/dL — ABNORMAL HIGH (ref 65–99)
Potassium: 3.8 mmol/L (ref 3.5–5.1)
SODIUM: 136 mmol/L (ref 135–145)
TOTAL PROTEIN: 6.7 g/dL (ref 6.5–8.1)

## 2017-10-25 LAB — URINALYSIS, ROUTINE W REFLEX MICROSCOPIC
BILIRUBIN URINE: NEGATIVE
Glucose, UA: >=500 mg/dL — AB
HGB URINE DIPSTICK: NEGATIVE
Ketones, ur: NEGATIVE mg/dL
Leukocytes, UA: NEGATIVE
Nitrite: NEGATIVE
PROTEIN: NEGATIVE mg/dL
Specific Gravity, Urine: 1.005 (ref 1.005–1.030)
Squamous Epithelial / LPF: NONE SEEN
pH: 6 (ref 5.0–8.0)

## 2017-10-25 LAB — INFLUENZA PANEL BY PCR (TYPE A & B)
INFLBPCR: NEGATIVE
Influenza A By PCR: NEGATIVE

## 2017-10-25 LAB — TROPONIN I: Troponin I: <0.03 ng/mL (ref <0.03)

## 2017-10-25 LAB — GLUCOSE, CAPILLARY: Glucose-Capillary: 122 mg/dL — ABNORMAL HIGH (ref 65–99)

## 2017-10-25 LAB — I-STAT CG4 LACTIC ACID, ED: Lactic Acid, Venous: 1.26 mmol/L (ref 0.5–1.9)

## 2017-10-25 LAB — I-STAT TROPONIN, ED: TROPONIN I, POC: 0 ng/mL (ref 0.00–0.08)

## 2017-10-25 LAB — SEDIMENTATION RATE: Sed Rate: 5 mm/hr (ref 0–16)

## 2017-10-25 LAB — C-REACTIVE PROTEIN: CRP: 2.3 mg/dL — ABNORMAL HIGH (ref <1.0)

## 2017-10-25 MED ORDER — SODIUM CHLORIDE 0.9% FLUSH
3.0000 mL | INTRAVENOUS | Status: DC | PRN
Start: 2017-10-25 — End: 2017-11-02

## 2017-10-25 MED ORDER — ALPRAZOLAM 0.5 MG PO TABS
0.2500 mg | ORAL_TABLET | Freq: Two times a day (BID) | ORAL | Status: DC | PRN
  Administered 2017-10-26 – 2017-11-01 (×6): 0.25 mg via ORAL
  Filled 2017-10-25 (×7): qty 1

## 2017-10-25 MED ORDER — ATORVASTATIN CALCIUM 40 MG PO TABS
40.0000 mg | ORAL_TABLET | Freq: Every day | ORAL | Status: DC
  Administered 2017-10-25 – 2017-11-01 (×8): 40 mg via ORAL
  Filled 2017-10-25 (×8): qty 1

## 2017-10-25 MED ORDER — SENNOSIDES-DOCUSATE SODIUM 8.6-50 MG PO TABS: 1.0000 | ORAL_TABLET | Freq: Every evening | ORAL | Status: DC | PRN

## 2017-10-25 MED ORDER — INSULIN ASPART 100 UNIT/ML ~~LOC~~ SOLN
0.0000 [IU] | Freq: Every day | SUBCUTANEOUS | Status: DC
  Administered 2017-10-27 – 2017-10-30 (×2): 2 [IU] via SUBCUTANEOUS
  Administered 2017-11-01: 4 [IU] via SUBCUTANEOUS

## 2017-10-25 MED ORDER — FLUTICASONE PROPIONATE 50 MCG/ACT NA SUSP: 2.0000 | Freq: Every day | NASAL | Status: DC | PRN

## 2017-10-25 MED ORDER — ASPIRIN EC 81 MG PO TBEC
81.0000 mg | DELAYED_RELEASE_TABLET | Freq: Every day | ORAL | Status: DC
  Administered 2017-10-25 – 2017-11-01 (×8): 81 mg via ORAL
  Filled 2017-10-25 (×8): qty 1

## 2017-10-25 MED ORDER — SODIUM CHLORIDE 0.9% FLUSH
3.0000 mL | Freq: Two times a day (BID) | INTRAVENOUS | Status: DC
  Administered 2017-10-25 – 2017-11-01 (×8): 3 mL via INTRAVENOUS

## 2017-10-25 MED ORDER — TIOTROPIUM BROMIDE MONOHYDRATE 18 MCG IN CAPS
18.0000 ug | ORAL_CAPSULE | Freq: Every day | RESPIRATORY_TRACT | Status: DC
Start: 2017-10-25 — End: 2017-11-02
  Administered 2017-10-26 – 2017-11-02 (×8): 18 ug via RESPIRATORY_TRACT
  Filled 2017-10-25 (×2): qty 5

## 2017-10-25 MED ORDER — FENOFIBRATE 160 MG PO TABS
160.0000 mg | ORAL_TABLET | Freq: Every day | ORAL | Status: DC
  Administered 2017-10-25 – 2017-11-02 (×9): 160 mg via ORAL
  Filled 2017-10-25 (×9): qty 1

## 2017-10-25 MED ORDER — SODIUM CHLORIDE 0.9 % IV SOLN
INTRAVENOUS | Status: AC
  Filled 2017-10-25 (×2): qty 1

## 2017-10-25 MED ORDER — GLUCERNA SHAKE PO LIQD
237.0000 mL | Freq: Three times a day (TID) | ORAL | Status: DC
  Administered 2017-10-25 – 2017-10-26 (×2): 237 mL via ORAL

## 2017-10-25 MED ORDER — VANCOMYCIN HCL IN DEXTROSE 1-5 GM/200ML-% IV SOLN
1000.0000 mg | Freq: Once | INTRAVENOUS | Status: AC
  Administered 2017-10-25: 1000 mg via INTRAVENOUS
  Filled 2017-10-25: qty 200

## 2017-10-25 MED ORDER — GABAPENTIN 300 MG PO CAPS
300.0000 mg | ORAL_CAPSULE | Freq: Every day | ORAL | Status: DC
  Administered 2017-10-25 – 2017-11-01 (×8): 300 mg via ORAL
  Filled 2017-10-25 (×8): qty 1

## 2017-10-25 MED ORDER — HYDROCODONE-ACETAMINOPHEN 5-325 MG PO TABS
1.0000 | ORAL_TABLET | Freq: Three times a day (TID) | ORAL | Status: DC | PRN
  Administered 2017-10-26: 1 via ORAL
  Filled 2017-10-25: qty 1

## 2017-10-25 MED ORDER — KETOROLAC TROMETHAMINE 15 MG/ML IJ SOLN
15.0000 mg | Freq: Four times a day (QID) | INTRAMUSCULAR | Status: AC | PRN
  Administered 2017-10-26: 15 mg via INTRAVENOUS
  Filled 2017-10-25: qty 1

## 2017-10-25 MED ORDER — INSULIN ASPART 100 UNIT/ML ~~LOC~~ SOLN
3.0000 [IU] | Freq: Three times a day (TID) | SUBCUTANEOUS | Status: DC
  Administered 2017-10-26 – 2017-11-02 (×21): 3 [IU] via SUBCUTANEOUS

## 2017-10-25 MED ORDER — ONDANSETRON HCL 4 MG PO TABS: 4.0000 mg | ORAL_TABLET | Freq: Four times a day (QID) | ORAL | Status: DC | PRN

## 2017-10-25 MED ORDER — ENOXAPARIN SODIUM 40 MG/0.4ML ~~LOC~~ SOLN
40.0000 mg | SUBCUTANEOUS | Status: DC
Start: 2017-10-25 — End: 2017-11-02
  Administered 2017-10-25 – 2017-11-01 (×8): 40 mg via SUBCUTANEOUS
  Filled 2017-10-25 (×8): qty 0.4

## 2017-10-25 MED ORDER — DILTIAZEM HCL ER COATED BEADS 240 MG PO CP24
240.0000 mg | ORAL_CAPSULE | Freq: Every day | ORAL | Status: DC
  Administered 2017-10-25 – 2017-11-02 (×9): 240 mg via ORAL
  Filled 2017-10-25 (×9): qty 1

## 2017-10-25 MED ORDER — INSULIN ASPART 100 UNIT/ML ~~LOC~~ SOLN
0.0000 [IU] | Freq: Three times a day (TID) | SUBCUTANEOUS | Status: DC
  Administered 2017-10-27: 2 [IU] via SUBCUTANEOUS
  Administered 2017-10-27: 5 [IU] via SUBCUTANEOUS
  Administered 2017-10-27 – 2017-10-28 (×2): 3 [IU] via SUBCUTANEOUS
  Administered 2017-10-28: 5 [IU] via SUBCUTANEOUS
  Administered 2017-10-29 (×2): 3 [IU] via SUBCUTANEOUS
  Administered 2017-10-30: 2 [IU] via SUBCUTANEOUS
  Administered 2017-10-30: 11 [IU] via SUBCUTANEOUS
  Administered 2017-10-30: 3 [IU] via SUBCUTANEOUS
  Administered 2017-10-31 (×2): 8 [IU] via SUBCUTANEOUS
  Administered 2017-11-01: 2 [IU] via SUBCUTANEOUS
  Administered 2017-11-01: 5 [IU] via SUBCUTANEOUS
  Administered 2017-11-02: 2 [IU] via SUBCUTANEOUS

## 2017-10-25 MED ORDER — SODIUM CHLORIDE 0.9 % IV SOLN
1.0000 g | Freq: Three times a day (TID) | INTRAVENOUS | Status: DC
  Administered 2017-10-25 – 2017-11-01 (×20): 1 g via INTRAVENOUS
  Filled 2017-10-25 (×20): qty 1

## 2017-10-25 MED ORDER — ONDANSETRON HCL 4 MG/2ML IJ SOLN: 4.0000 mg | Freq: Four times a day (QID) | INTRAMUSCULAR | Status: DC | PRN

## 2017-10-25 MED ORDER — SODIUM CHLORIDE 0.9 % IV SOLN
2.0000 g | Freq: Once | INTRAVENOUS | Status: AC
  Administered 2017-10-25: 2 g via INTRAVENOUS
  Filled 2017-10-25: qty 2

## 2017-10-25 MED ORDER — ACETAMINOPHEN 650 MG RE SUPP: 650.0000 mg | Freq: Four times a day (QID) | RECTAL | Status: DC | PRN

## 2017-10-25 MED ORDER — PANTOPRAZOLE SODIUM 40 MG PO TBEC
40.0000 mg | DELAYED_RELEASE_TABLET | Freq: Every day | ORAL | Status: DC
  Administered 2017-10-25 – 2017-11-02 (×9): 40 mg via ORAL
  Filled 2017-10-25 (×9): qty 1

## 2017-10-25 MED ORDER — ACETAMINOPHEN 325 MG PO TABS
650.0000 mg | ORAL_TABLET | Freq: Four times a day (QID) | ORAL | Status: DC | PRN
  Administered 2017-10-26: 650 mg via ORAL
  Filled 2017-10-25: qty 2

## 2017-10-25 MED ORDER — SODIUM CHLORIDE 0.9 % IV SOLN
INTRAVENOUS | Status: AC
  Administered 2017-10-25: 19:00:00 via INTRAVENOUS

## 2017-10-25 MED ORDER — VANCOMYCIN HCL IN DEXTROSE 1-5 GM/200ML-% IV SOLN
1000.0000 mg | Freq: Two times a day (BID) | INTRAVENOUS | Status: DC
  Administered 2017-10-26 – 2017-10-29 (×8): 1000 mg via INTRAVENOUS
  Filled 2017-10-25 (×8): qty 200

## 2017-10-25 MED ORDER — ALBUTEROL SULFATE (2.5 MG/3ML) 0.083% IN NEBU
2.5000 mg | INHALATION_SOLUTION | RESPIRATORY_TRACT | Status: DC | PRN
  Administered 2017-10-27: 2.5 mg via RESPIRATORY_TRACT
  Filled 2017-10-25 (×2): qty 3

## 2017-10-25 MED ORDER — IOPAMIDOL (ISOVUE-370) INJECTION 76%
100.0000 mL | Freq: Once | INTRAVENOUS | Status: AC | PRN
  Administered 2017-10-25: 100 mL via INTRAVENOUS

## 2017-10-25 MED ORDER — SODIUM CHLORIDE 0.9 % IV BOLUS
1000.0000 mL | Freq: Once | INTRAVENOUS | Status: AC
  Administered 2017-10-25: 1000 mL via INTRAVENOUS

## 2017-10-25 MED ORDER — SODIUM CHLORIDE 0.9 % IV SOLN
250.0000 mL | INTRAVENOUS | Status: DC | PRN
Start: 2017-10-25 — End: 2017-11-02

## 2017-10-25 MED ORDER — TAMSULOSIN HCL 0.4 MG PO CAPS
0.4000 mg | ORAL_CAPSULE | Freq: Every day | ORAL | Status: DC
  Administered 2017-10-25 – 2017-11-02 (×9): 0.4 mg via ORAL
  Filled 2017-10-25 (×9): qty 1

## 2017-10-25 NOTE — H&P (Signed)
History and Physical  [name] PIR:[id number] DOB: 14-Feb-1956 DOA: 10/25/2017  Referring physician: Regenia Skeeter PCP: Sinda Du, MD  Hematologist: Dr. Zigmund Daniel Chief Complaint: Fever   HPI: [name] is a 62 y.o. male former smoker (30+ years) with COPD who had recently been discharged from the hospital few days ago for presumed COPD exacerbation.  He had been treated with steroids and antibiotics.  He was sent home on a steroid taper that is nearly completed.  He reports that he started feeling worse as the steroids were being tapered down.  He did have a fever of 102 prior to that last admission.  His chest x-ray did not show any acute findings.  He improved with IV fluid hydration IV antibiotics and steroids.   The patient had been doing fairly well at home until yesterday when he began to feel ill again.  The patient was noted to have a sublingual fever today of 101 at about 1 PM.  He reported feeling some shortness of breath and chest pressure.  He had no chest pain or radiation of pain.  He denies swollen glands under the neck and arm pits.  He had been reporting that he felt very fatigued and this prompted him to come to the emergency department.  The patient reports that he had been taking his medications as prescribed.  The patient reports cough.  NO KNOWN SICK CONTACTS.  NO ONE SICK AT HOME.   The patient also reports chest discomfort in the Left chest wall.  It is nonradiating, not associated with coughing or deep breathing.  Nothing seems to exacerbate the pain.  His cough is basically nonproductive.  No hemoptysis.   ED course: The patient was seen and evaluated.  He was noted to be afebrile but he was hypoxic with a pulse ox in the low 80s and appeared ill.  He was placed on oxygen with good improvement and pulse ox back up to 96%.  The patient received a sepsis work-up and started on broad-spectrum antibiotics for presumed failure of outpatient management for an atypical  pneumonia.  He did have a normal lactic acid.  His troponin was 0.00.  He had a normal white blood cell count.  His platelets were noted to be low at 104.  His chest x-ray did not show any acute findings.  He does have a history of granulomatous disease in the lungs.  Review of Systems: All systems reviewed and apart from history of presenting illness, are negative.  Past Medical History:  Diagnosis Date  . Anxiety   . Asthma   . Chest pain    a. 2003 Cath: nl cors.  [name] COPD (chronic obstructive pulmonary disease) ([place])   . Depression   . Diabetes mellitus   . Dyslipidemia   . Dysrhythmia   . Fracture 12/15   lower back "L-1"  . GERD (gastroesophageal reflux disease)   . Hypertension   . PONV (postoperative nausea and vomiting)   . Splenomegaly    Past Surgical History:  Procedure Laterality Date  . BIOPSY  07/26/2016   Procedure: BIOPSY;  Surgeon: [name], MD;  Location: [facility];  Service: Endoscopy;;  gastric  . CARDIAC CATHETERIZATION  10/22/2001   normal coronary arteriea (Dr. Domenic Moras)  . COLONOSCOPY  08/31/2011   YTK:[id number] rectal and colon polyps-treated. Single tubular adenoma. Next TCS 08/2016  . COLONOSCOPY N/A 07/26/2016   Dr. [name]: diverticulosis in sigmoid and descending colon. 6 mm benign splenic  flexure. surveillance 5 yeras  . ESOPHAGOGASTRODUODENOSCOPY N/A 07/26/2016   Dr. [name]: normal esophagus, portal gastropathy, chronic gastritis, normal duodenum, screening EGD 2 years   . ESOPHAGOGASTRODUODENOSCOPY (EGD) WITH ESOPHAGEAL DILATION N/A 05/15/2013   UEK:CMKLKJ esophagus-status post Venia Minks dilation/Portal gastropathy. Antral erosions-status post biopsy. extrinsic compression along lesser curvature likely secondary to splenomegaly. gastric bx benign  . HERNIA REPAIR  03/2009  . POLYPECTOMY  07/26/2016   Procedure: POLYPECTOMY;  Surgeon: [name], MD;  Location: [facility];  Service: Endoscopy;;  colon  . TRANSTHORACIC ECHOCARDIOGRAM  2011   EF  17-91%, stage 1 diastolic dysfunction, trace TR & pulm valve regurg   . VASECTOMY  1989   Social History:  reports that he quit smoking about 6 years ago. His smoking use included cigarettes. He has a 45.00 pack-year smoking history. He has quit using smokeless tobacco. He reports that he does not drink alcohol or use drugs.  Allergies  Allergen Reactions  . Codeine Rash  . Baclofen     lethargic   . Simvastatin Nausea Only    Family History  Problem Relation Age of Onset  . Heart disease Mother   . Cancer Mother        lymph nodes  . Diabetes Mother   . Arthritis Unknown   . Lung disease Unknown   . Asthma Unknown   . Kidney disease Unknown   . Ovarian cancer Sister   . Diabetes Brother   . Heart disease Brother   . Hypertension Brother   . Diabetes Sister   . Diabetes Sister   . Cirrhosis Sister 39       no etoh  . Colon cancer Neg Hx     Prior to Admission medications   Medication Sig Start Date End Date Taking? Authorizing Provider  acetaminophen (TYLENOL) 500 MG tablet Take 1,000 mg by mouth daily as needed for headache.    [provider]  ALPRAZolam [name]) 0.25 MG tablet Take 0.25 mg by mouth 2 (two) times daily.     [provider]  Apple Cider Vinegar 600 MG CAPS Take 2 capsules by mouth daily.    [provider]  Artificial Tear Ointment (REFRESH P.M. OP) Apply 1 application to eye daily as needed (dry eye). As directed     [provider]  aspirin EC 81 MG tablet Take 81 mg by mouth at bedtime.     [provider]  atorvastatin (LIPITOR) 40 MG tablet Take 1 tablet (40 mg total) by mouth daily. 05/07/17   Pixie Casino, MD  cefdinir (OMNICEF) 300 MG capsule Take 1 capsule (300 mg total) by mouth 2 (two) times daily. 10/21/17   Sinda Du, MD  Choline Fenofibrate (FENOFIBRIC ACID) 135 MG CPDR Take 1 capsule by mouth daily. 05/07/17   Hilty, [name], MD  clotrimazole-betamethasone (LOTRISONE) cream Apply 1  application topically 2 (two) times daily as needed (psoriasis).    [provider]  diltiazem (CARDIZEM CD) 240 MG 24 hr capsule Take 1 capsule (240 mg total) by mouth daily. 05/07/17   Hilty, [name], MD  fluticasone (FLONASE) 50 MCG/ACT nasal spray Place 2 sprays into both nostrils daily as needed for allergies.  06/26/16   [provider]  gabapentin (NEURONTIN) 300 MG capsule Take 1 capsule by mouth at bedtime. 07/18/17   [provider]  HYDROcodone-acetaminophen (NORCO/VICODIN) 5-325 MG per tablet Take 1 tablet by mouth every 8 (eight) hours as needed for moderate pain. Patient taking  differently: Take 1 tablet by mouth daily as needed for moderate pain.  11/09/14   [name], MD  JARDIANCE 25 MG TABS tablet Take 25 mg by mouth every evening.  01/01/17   [provider]  Multiple Vitamin (MULITIVITAMIN WITH MINERALS) TABS Take 1 tablet by mouth at bedtime.     [provider]  pantoprazole (PROTONIX) 40 MG tablet Take 1 tablet (40 mg total) by mouth daily. 07/31/17   [name], NP  predniSONE (DELTASONE) 10 MG tablet 58fr 1 day,3x1d,2x1d,1x1d 10/21/17   HSinda Du MD  PROAIR HFA 108 [phone]) MCG/ACT inhaler Inhale 2 puffs into the lungs every 4 (four) hours as needed for wheezing or shortness of breath.  08/29/15   [provider]  SitaGLIPtin-MetFORMIN HCl (JANUMET XR) [phone] MG TB24 Take 1 tablet by mouth daily.    [provider]  tamsulosin (FLOMAX) 0.4 MG CAPS capsule Take 0.4 mg by mouth daily.    [provider]  tiotropium (SPIRIVA) 18 MCG inhalation capsule Place 18 mcg into inhaler and inhale daily.    [provider]   Physical Exam: Vitals:   10/25/17 1522 10/25/17 1530 10/25/17 1630 10/25/17 1657  BP: 109/66 111/76  (!) 111/51  Pulse: (!) 115  90 (!) 102  Resp: (!) 24 (!) 23 16   Temp: 98.3 F (36.8 C)     TempSrc: Oral     SpO2: (!) 89%  (!) 85% 95%     General exam:  Moderately built and nourished patient, lying comfortably supine on the gurney in no obvious distress.  Head, eyes and ENT: Nontraumatic and normocephalic. Pupils equally reacting to light and accommodation. Oral mucosa dry.  Neck: Supple. Thyromegaly noted, no masses or nodules palpated.  No JVD, carotid bruit.  Lymphatics: No lymphadenopathy.  Respiratory system: shallow BS, tachypneic, faint rales RLL, No increased work of breathing.  Cardiovascular system: S1 and S2 heard. Tachycardia.  No JVD, murmurs, gallops, clicks or pedal edema.  Gastrointestinal system: Abdomen is nondistended, soft and nontender. Normal bowel sounds heard. No organomegaly or masses appreciated.  Central nervous system: Alert and oriented. No focal neurological deficits.  Extremities: Symmetric 5 x 5 power. Peripheral pulses symmetrically felt.   Skin: No rashes or acute findings.  Musculoskeletal system: Negative exam.  Psychiatry: Pleasant and cooperative.  Labs on Admission:  Basic Metabolic Panel: Recent Labs  Lab 10/21/17 0539 10/25/17 1536  NA  --  136  K  --  3.8  CL  --  99*  CO2  --  25  GLUCOSE  --  149*  BUN  --  28*  CREATININE 1.06 0.79  CALCIUM  --  9.5   Liver Function Tests: Recent Labs  Lab 10/25/17 1536  AST 23  ALT 45  ALKPHOS 76  BILITOT 1.0  PROT 6.7  ALBUMIN 3.6   No results for input(s): LIPASE, AMYLASE in the last 168 hours. No results for input(s): AMMONIA in the last 168 hours. CBC: Recent Labs  Lab 10/25/17 1536  WBC 5.7  NEUTROABS 4.7  HGB 14.8  HCT 46.2  MCV 82.8  PLT 104*   Cardiac Enzymes: No results for input(s): CKTOTAL, CKMB, CKMBINDEX, TROPONINI in the last 168 hours.  BNP (last 3 results) No results for input(s): PROBNP in the last 8760 hours. CBG: Recent Labs  Lab 10/20/17 0732 10/20/17 1128 10/20/17 1601 10/20/17 2053 10/21/17 0743  GLUCAP 297* 235* 184* 356* 229*    Radiological Exams on Admission:  Dg Chest 2  View  Result Date: 10/25/2017 CLINICAL DATA:  Cough and fever EXAM: CHEST - 2 VIEW COMPARISON:  October 17, 2017 chest radiograph and chest CT August 09, 2017 FINDINGS: Lungs are hyperexpanded. There are calcified granulomas in the right upper lobe. There is no edema or consolidation. Heart size and pulmonary vascularity are normal. No adenopathy. No evident bone lesions. IMPRESSION: Hyperexpansion without edema or consolidation. Calcified granulomas in right upper lobe. Stable cardiac silhouette. Comment: The nodular opacity seen in the left base on recent CT is not appreciable by radiography. Recommendations made for follow-up CT based on the prior chest CT remain in effect. Electronically Signed   By: Lowella Grip III M.D.   On: 10/25/2017 16:15   EKG: Independently reviewed. Sinus tachycardia seen.   Assessment/Plan Principal Problem:   Fever, unknown origin Active Problems:   Muscle weakness (generalized)   Non-alcoholic fatty liver disease   Splenomegaly   GERD (gastroesophageal reflux disease)   Thrombocytopenia ([name])   Dyslipidemia   Type 2 diabetes mellitus without complication, without long-term current use of insulin ([name])   Dehydration  1. Fever of unknown origin- I am not convinced at this time that the patient has an active infection but there are some records that he has had a chronic atypical mycobacterial infection.  We have not found any definitive evidence of infection at this time.  He does have granulomatous lung disease which I am concerned could possibly be undiagnosed sarcoidosis.  It seems that his symptoms have flared up as the steroids are being weaned down.  I have ordered a CT of the abdomen with contrast to better evaluate the lungs for infection that is not seen on chest x-ray.  Continue broad-spectrum antibiotics for now with atypical coverage.  Continue oxygen support.  Urinalysis is pending.  Follow cultures. Check sed rate and CRP.  Holding the steroids for  now but may need to restart them to see if patient suddenly gets better and would help with diagnosis. 2. History of chronic atypical mycobacterial infection - Pt was seen by hem/onc and they obtained a PET scan on 08/26/17 that reported that the nodules were suspected sequela of chronic atypical mycobacterial infection.  He likely will need to go to the outpatient ID clinic to have this evaluated further and possibly started on treatment.   3. Goiter - no nodules palpated, will check thyroid studies and obtain thyroid ultrasound.    4. COPD-patient is recovering from a recent COPD exacerbation and has completed a course of steroids and antibiotics.  Follow clinically.  Continue nebs as needed.  He is not in acute exacerbation at this time.   5. GERD-Protonix ordered for GI protection. 6. Thrombocytopenia-this is been chronic and he is being followed by hematology.  7. Type 2 diabetes mellitus-holding home oral medications and providing sliding scale coverage and meal coverage during the hospitalization. 8. Sinus tachycardia - secondary to underlying illness, dehydration, treating with IVFs.    DVT Prophylaxis: Lovenox Code Status: Full Family Communication: At bedside Disposition Plan: Pending medical treatment and improvement  Time spent: 58 minutes  Irwin Brakeman, MD [facility] Pager [phone]  If 7PM-7AM, please contact night-coverage [email or web address] Password [email or web address] 10/25/2017, 5:03 PM

## 2017-10-25 NOTE — Progress Notes (Signed)
Pharmacy Note:  Initial antibiotic(s) regimen of Vancomycin and Cefepime ordered by EDP to treat sepsis.  Estimated Creatinine Clearance: 67.2 mL/min (by C-G formula based on SCr of 1.06 mg/dL).   Allergies  Allergen Reactions  . Codeine Rash  . Baclofen     lethargic   . Simvastatin Nausea Only    Vitals:   10/25/17 1522 10/25/17 1530  BP: 109/66 111/76  Pulse: (!) 115   Resp: (!) 24 (!) 23  Temp: 98.3 F (36.8 C)   SpO2: (!) 89%     Anti-infectives (From admission, onward)   Start     Dose/Rate Route Frequency Ordered Stop   10/25/17 1545  ceFEPIme (MAXIPIME) 2 g in sodium chloride 0.9 % 100 mL IVPB     2 g 200 mL/hr over 30 Minutes Intravenous  Once 10/25/17 1537     10/25/17 1545  vancomycin (VANCOCIN) IVPB 1000 mg/200 mL premix     1,000 mg 200 mL/hr over 60 Minutes Intravenous  Once 10/25/17 1537       Plan: Initial dose(s) of Vancomycin 1048m and Cefepime 2009m X 1 ordered. F/U admission orders for further dosing if therapy continued.  HaEna DawleyRPCha [facility]/19/2019 4:03 PM

## 2017-10-25 NOTE — ED Triage Notes (Addendum)
Pt was discharged from hospital Monday. Noted to have a fever today approx 101 at 1300. Pt reports feeling short of breath and chest pressure. Pt given tylenol for fever. Pt reports feeling very tired

## 2017-10-25 NOTE — ED Notes (Signed)
To rad

## 2017-10-25 NOTE — ED Provider Notes (Signed)
[facility] Provider Note   CSN: [id number] Arrival date & time: 10/25/17  1511     History   Chief Complaint Chief Complaint  Patient presents with  . Fever    HPI [name] is a 62 y.o. male.  HPI  62 year old male with a history of COPD, diabetes and recent admission for likely COPD exacerbation and febrile illness presents with shortness of breath and fever.  History is obtained mostly from the wife.  He was discharged from the hospital on 4/15.  He was treated with antibiotics and is still on antibiotics but is not sure which one.  He is also on prednisone.  The patient has been having shortness of breath and chest pressure since yesterday.  He states that he had a fever of 101 today.  Has been having more shortness of breath and feeling like he has a cough that he cannot cough up.  He denies vomiting.  No other symptoms.  Was given Tylenol prior to arrival.  Both of his feet were swollen a couple days ago but this has resolved.  No unilateral leg swelling.  Past Medical History:  Diagnosis Date  . Anxiety   . Asthma   . Chest pain    a. 2003 Cath: nl cors.  [name] COPD (chronic obstructive pulmonary disease) ([place])   . Depression   . Diabetes mellitus   . Dyslipidemia   . Dysrhythmia   . Fracture 12/15   lower back "L-1"  . GERD (gastroesophageal reflux disease)   . Hypertension   . PONV (postoperative nausea and vomiting)   . Splenomegaly     Patient Active Problem List   Diagnosis Date Noted  . Fever, unknown origin 10/25/2017  . COPD exacerbation ([place]) 10/19/2017  . Dehydration 10/19/2017  . Febrile illness, acute 10/17/2017  . NASH (nonalcoholic steatohepatitis) 08/05/2017  . Liver fibrosis 01/31/2017  . Hx of adenomatous colonic polyps 07/05/2016  . Hyperproteinemia 09/05/2014  . HTN (hypertension) 10/14/2013  . Dyslipidemia 10/14/2013  . Type 2 diabetes mellitus without complication, without long-term current use of insulin ([place])  10/14/2013  . Leukopenia 08/08/2013  . Thrombocytopenia ([place]) 08/08/2013  . Lower abdominal pain 07/06/2013  . Abnormal LFTs 07/06/2013  . GERD (gastroesophageal reflux disease) 07/06/2013  . Unspecified constipation 07/06/2013  . Abdominal bloating 04/21/2013  . Abdominal pain, generalized 04/21/2013  . Non-alcoholic fatty liver disease 04/21/2013  . Splenomegaly 04/21/2013  . Lumbago 11/26/2011  . Muscle weakness (generalized) 11/26/2011  . Bilateral leg pain 11/06/2011  . Back pain with radiation 11/06/2011  . DDD (degenerative disc disease), lumbar 11/06/2011    Past Surgical History:  Procedure Laterality Date  . BIOPSY  07/26/2016   Procedure: BIOPSY;  Surgeon: [name], MD;  Location: [facility];  Service: Endoscopy;;  gastric  . CARDIAC CATHETERIZATION  10/22/2001   normal coronary arteriea (Dr. Domenic Moras)  . COLONOSCOPY  08/31/2011   ION:[id number] rectal and colon polyps-treated. Single tubular adenoma. Next TCS 08/2016  . COLONOSCOPY N/A 07/26/2016   Dr. [name]: diverticulosis in sigmoid and descending colon. 6 mm benign splenic flexure. surveillance 5 yeras  . ESOPHAGOGASTRODUODENOSCOPY N/A 07/26/2016   Dr. [name]: normal esophagus, portal gastropathy, chronic gastritis, normal duodenum, screening EGD 2 years   . ESOPHAGOGASTRODUODENOSCOPY (EGD) WITH ESOPHAGEAL DILATION N/A 05/15/2013   LKG:MWNUUV esophagus-status post Venia Minks dilation/Portal gastropathy. Antral erosions-status post biopsy. extrinsic compression along lesser curvature likely secondary to splenomegaly. gastric bx benign  . HERNIA REPAIR  03/2009  .  POLYPECTOMY  07/26/2016   Procedure: POLYPECTOMY;  Surgeon: [name], MD;  Location: [facility];  Service: Endoscopy;;  colon  . TRANSTHORACIC ECHOCARDIOGRAM  2011   EF 32-44%, stage 1 diastolic dysfunction, trace TR & pulm valve regurg   . VASECTOMY  1989        Home Medications    Prior to Admission medications   Medication Sig Start Date End  Date Taking? Authorizing Provider  acetaminophen (TYLENOL) 500 MG tablet Take 1,000 mg by mouth daily as needed for headache.    [provider]  ALPRAZolam [name]) 0.25 MG tablet Take 0.25 mg by mouth 2 (two) times daily.     [provider]  Apple Cider Vinegar 600 MG CAPS Take 2 capsules by mouth daily.    [provider]  Artificial Tear Ointment (REFRESH P.M. OP) Apply 1 application to eye daily as needed (dry eye). As directed     [provider]  aspirin EC 81 MG tablet Take 81 mg by mouth at bedtime.     [provider]  atorvastatin (LIPITOR) 40 MG tablet Take 1 tablet (40 mg total) by mouth daily. 05/07/17   Pixie Casino, MD  cefdinir (OMNICEF) 300 MG capsule Take 1 capsule (300 mg total) by mouth 2 (two) times daily. 10/21/17   Sinda Du, MD  Choline Fenofibrate (FENOFIBRIC ACID) 135 MG CPDR Take 1 capsule by mouth daily. 05/07/17   Hilty, [name], MD  clotrimazole-betamethasone (LOTRISONE) cream Apply 1 application topically 2 (two) times daily as needed (psoriasis).    [provider]  diltiazem (CARDIZEM CD) 240 MG 24 hr capsule Take 1 capsule (240 mg total) by mouth daily. 05/07/17   Hilty, [name], MD  fluticasone (FLONASE) 50 MCG/ACT nasal spray Place 2 sprays into both nostrils daily as needed for allergies.  06/26/16   [provider]  gabapentin (NEURONTIN) 300 MG capsule Take 1 capsule by mouth at bedtime. 07/18/17   [provider]  HYDROcodone-acetaminophen (NORCO/VICODIN) 5-325 MG per tablet Take 1 tablet by mouth every 8 (eight) hours as needed for moderate pain. Patient taking differently: Take 1 tablet by mouth daily as needed for moderate pain.  11/09/14   [name], MD  JARDIANCE 25 MG TABS tablet Take 25 mg by mouth every evening.  01/01/17   [provider]  Multiple Vitamin (MULITIVITAMIN WITH MINERALS) TABS Take 1 tablet by mouth at bedtime.     [provider]   pantoprazole (PROTONIX) 40 MG tablet Take 1 tablet (40 mg total) by mouth daily. 07/31/17   [name], NP  predniSONE (DELTASONE) 10 MG tablet 33fr 1 day,3x1d,2x1d,1x1d 10/21/17   HSinda Du MD  PROAIR HFA 108 [phone]) MCG/ACT inhaler Inhale 2 puffs into the lungs every 4 (four) hours as needed for wheezing or shortness of breath.  08/29/15   [provider]  SitaGLIPtin-MetFORMIN HCl (JANUMET XR) [phone] MG TB24 Take 1 tablet by mouth daily.    [provider]  tamsulosin (FLOMAX) 0.4 MG CAPS capsule Take 0.4 mg by mouth daily.    [provider]  tiotropium (SPIRIVA) 18 MCG inhalation capsule Place 18 mcg into inhaler and inhale daily.    [provider]    Family History Family History  Problem Relation Age of Onset  . Heart disease Mother   . Cancer Mother        lymph nodes  . Diabetes Mother   . Arthritis Unknown   .  Lung disease Unknown   . Asthma Unknown   . Kidney disease Unknown   . Ovarian cancer Sister   . Diabetes Brother   . Heart disease Brother   . Hypertension Brother   . Diabetes Sister   . Diabetes Sister   . Cirrhosis Sister 62       no etoh  . Colon cancer Neg Hx     Social History Social History   Tobacco Use  . Smoking status: Former Smoker    Packs/day: 1.25    Years: 36.00    Pack years: 45.00    Types: Cigarettes    Last attempt to quit: 04/2011    Years since quitting: 6.5  . Smokeless tobacco: Former Systems developer  . Tobacco comment: quit x 6 years  Substance Use Topics  . Alcohol use: No  . Drug use: No     Allergies   Codeine; Baclofen; and Simvastatin   Review of Systems Review of Systems  Constitutional: Positive for fever.  Respiratory: Positive for cough and shortness of breath.   Cardiovascular: Positive for chest pain and leg swelling.  Gastrointestinal: Negative for abdominal pain and vomiting.  All other systems reviewed and are negative.    Physical Exam Updated Vital Signs BP  109/65   Pulse (!) 103   Temp 98.3 F (36.8 C) (Oral)   Resp 16   SpO2 95%   Physical Exam  Constitutional: He is oriented to person, place, and time. He appears well-developed and well-nourished.  HENT:  Head: Normocephalic and atraumatic.  Right Ear: External ear normal.  Left Ear: External ear normal.  Nose: Nose normal.  Eyes: Right eye exhibits no discharge. Left eye exhibits no discharge.  Neck: Neck supple.  Cardiovascular: Regular rhythm and normal heart sounds. Tachycardia present.  Pulmonary/Chest: Effort normal. No accessory muscle usage. Tachypnea noted. He has decreased breath sounds. He has rales.  Abdominal: Soft. There is no tenderness.  Musculoskeletal: He exhibits no edema.  Neurological: He is alert and oriented to person, place, and time.  Skin: Skin is warm and dry. He is not diaphoretic.  Nursing note and vitals reviewed.    ED Treatments / Results  Labs (all labs ordered are listed, but only abnormal results are displayed) Labs Reviewed  COMPREHENSIVE METABOLIC PANEL - Abnormal; Notable for the following components:      Result Value   Chloride 99 (*)    Glucose, Bld 149 (*)    BUN 28 (*)    All other components within normal limits  CBC WITH DIFFERENTIAL/PLATELET - Abnormal; Notable for the following components:   RDW 17.0 (*)    Platelets 104 (*)    Lymphs Abs 0.6 (*)    All other components within normal limits  CULTURE, BLOOD (ROUTINE X 2)  CULTURE, BLOOD (ROUTINE X 2)  URINALYSIS, ROUTINE W REFLEX MICROSCOPIC  INFLUENZA PANEL BY PCR (TYPE A & B)  C-REACTIVE PROTEIN  SEDIMENTATION RATE  VITAMIN D 25 HYDROXY (VIT D DEFICIENCY, FRACTURES)  RPR  TSH  COMPREHENSIVE METABOLIC PANEL  MAGNESIUM  CBC WITH DIFFERENTIAL/PLATELET  T4, FREE  T3  I-STAT CG4 LACTIC ACID, ED  I-STAT TROPONIN, ED  I-STAT CG4 LACTIC ACID, ED    EKG EKG Interpretation  Date/Time:  Friday October 25 2017 15:19:17 EDT Ventricular Rate:  117 PR Interval:  80 QRS  Duration: 100 QT Interval:  322 QTC Calculation: 449 R Axis:   80 Text Interpretation:  Sinus tachycardia with short PR with Premature atrial complexes  with Abberant conduction T wave abnormality, consider inferior ischemia Abnormal ECG T wave changes more prominent compared to 2011 Confirmed by Sherwood Gambler [phone]) on 10/25/2017 3:26:28 PM   Radiology Dg Chest 2 View  Result Date: 10/25/2017 CLINICAL DATA:  Cough and fever EXAM: CHEST - 2 VIEW COMPARISON:  October 17, 2017 chest radiograph and chest CT August 09, 2017 FINDINGS: Lungs are hyperexpanded. There are calcified granulomas in the right upper lobe. There is no edema or consolidation. Heart size and pulmonary vascularity are normal. No adenopathy. No evident bone lesions. IMPRESSION: Hyperexpansion without edema or consolidation. Calcified granulomas in right upper lobe. Stable cardiac silhouette. Comment: The nodular opacity seen in the left base on recent CT is not appreciable by radiography. Recommendations made for follow-up CT based on the prior chest CT remain in effect. Electronically Signed   By: Lowella Grip III M.D.   On: 10/25/2017 16:15    Procedures Procedures (including critical care time)  Medications Ordered in ED Medications  ALPRAZolam (XANAX) tablet 0.25 mg (has no administration in time range)  aspirin EC tablet 81 mg (has no administration in time range)  atorvastatin (LIPITOR) tablet 40 mg (has no administration in time range)  Fenofibric Acid CPDR 1 capsule (has no administration in time range)  diltiazem (CARDIZEM CD) 24 hr capsule 240 mg (has no administration in time range)  fluticasone (FLONASE) 50 MCG/ACT nasal spray 2 spray (has no administration in time range)  gabapentin (NEURONTIN) capsule 300 mg (has no administration in time range)  HYDROcodone-acetaminophen (NORCO/VICODIN) 5-325 MG per tablet 1 tablet (has no administration in time range)  pantoprazole (PROTONIX) EC tablet 40 mg (has no  administration in time range)  tamsulosin (FLOMAX) capsule 0.4 mg (has no administration in time range)  tiotropium (SPIRIVA) inhalation capsule 18 mcg (has no administration in time range)  enoxaparin (LOVENOX) injection 40 mg (has no administration in time range)  sodium chloride flush (NS) 0.9 % injection 3 mL (has no administration in time range)  sodium chloride flush (NS) 0.9 % injection 3 mL (has no administration in time range)  0.9 %  sodium chloride infusion (has no administration in time range)  acetaminophen (TYLENOL) tablet 650 mg (has no administration in time range)    Or  acetaminophen (TYLENOL) suppository 650 mg (has no administration in time range)  ketorolac (TORADOL) 15 MG/ML injection 15 mg (has no administration in time range)  senna-docusate (Senokot-S) tablet 1 tablet (has no administration in time range)  ondansetron (ZOFRAN) tablet 4 mg (has no administration in time range)    Or  ondansetron (ZOFRAN) injection 4 mg (has no administration in time range)  albuterol (PROVENTIL) (2.5 MG/3ML) 0.083% nebulizer solution 2.5 mg (has no administration in time range)  insulin aspart (novoLOG) injection 0-15 Units (has no administration in time range)  insulin aspart (novoLOG) injection 0-5 Units (has no administration in time range)  insulin aspart (novoLOG) injection 3 Units (has no administration in time range)  0.9 %  sodium chloride infusion (has no administration in time range)  iopamidol (ISOVUE-370) 76 % injection 100 mL (has no administration in time range)  sodium chloride 0.9 % bolus 1,000 mL (0 mLs Intravenous Stopped 10/25/17 1659)  ceFEPIme (MAXIPIME) 2 g in sodium chloride 0.9 % 100 mL IVPB (0 g Intravenous Stopped 10/25/17 1625)  vancomycin (VANCOCIN) IVPB 1000 mg/200 mL premix (0 mg Intravenous Stopped 10/25/17 1659)     Initial Impression / Assessment and Plan / ED Course  I have reviewed  the triage vital signs and the nursing notes.  Pertinent labs &  imaging results that were available during my care of the patient were reviewed by me and considered in my medical decision making (see chart for details).     Symptoms are most concerning for pneumonia.  Given Rales heard and hypoxia in the setting of having a fever at home, he was given antibiotics to cover for hospital-acquired pneumonia as he was just admitted.  He is not in distress but on reevaluation, oxygen saturation around 83%.  It looked like he had just fallen asleep and so this is likely worse than his waking sats but he still never came above 90%.  He will need to be admitted for supportive care.  When discussing with hospitalist, we will obtain a CT scan as well for further diagnosis and rule out of other diseases.  He is tachycardic but this has improved.  He has not shown signs of shock such as having a lactic acid or hypotension.  Admit to hospitalist service.  Final Clinical Impressions(s) / ED Diagnoses   Final diagnoses:  Hypoxia  HCAP (healthcare-associated pneumonia)    ED Discharge Orders    None       Sherwood Gambler, MD 10/25/17 1758

## 2017-10-25 NOTE — ED Notes (Signed)
Report to [place], RN

## 2017-10-26 ENCOUNTER — Inpatient Hospital Stay (HOSPITAL_COMMUNITY): Payer: PPO

## 2017-10-26 LAB — COMPREHENSIVE METABOLIC PANEL
ALK PHOS: 59 U/L (ref 38–126)
ALT: 38 U/L (ref 17–63)
AST: 18 U/L (ref 15–41)
Albumin: 3.3 g/dL — ABNORMAL LOW (ref 3.5–5.0)
Anion gap: 13 (ref 5–15)
BILIRUBIN TOTAL: 1.2 mg/dL (ref 0.3–1.2)
BUN: 22 mg/dL — ABNORMAL HIGH (ref 6–20)
CALCIUM: 8.8 mg/dL — AB (ref 8.9–10.3)
CO2: 25 mmol/L (ref 22–32)
CREATININE: 0.84 mg/dL (ref 0.61–1.24)
Chloride: 101 mmol/L (ref 101–111)
GFR calc Af Amer: >60 mL/min (ref >60)
Glucose, Bld: 101 mg/dL — ABNORMAL HIGH (ref 65–99)
Potassium: 3.9 mmol/L (ref 3.5–5.1)
Sodium: 139 mmol/L (ref 135–145)
TOTAL PROTEIN: 6.4 g/dL — AB (ref 6.5–8.1)

## 2017-10-26 LAB — CBC WITH DIFFERENTIAL/PLATELET
BASOS ABS: 0 10*3/uL (ref 0.0–0.1)
Basophils Relative: 0 %
EOS ABS: 0.1 10*3/uL (ref 0.0–0.7)
Eosinophils Relative: 1 %
HEMATOCRIT: 45.2 % (ref 39.0–52.0)
Hemoglobin: 14.4 g/dL (ref 13.0–17.0)
LYMPHS ABS: 0.5 10*3/uL — AB (ref 0.7–4.0)
Lymphocytes Relative: 10 %
MCH: 26.5 pg (ref 26.0–34.0)
MCHC: 31.9 g/dL (ref 30.0–36.0)
MCV: 83.1 fL (ref 78.0–100.0)
MONO ABS: 0.3 10*3/uL (ref 0.1–1.0)
Monocytes Relative: 5 %
NEUTROS ABS: 4.1 10*3/uL (ref 1.7–7.7)
Neutrophils Relative %: 84 %
PLATELETS: 90 10*3/uL — AB (ref 150–400)
RBC: 5.44 MIL/uL (ref 4.22–5.81)
RDW: 18.6 % — AB (ref 11.5–15.5)
WBC: 5 10*3/uL (ref 4.0–10.5)

## 2017-10-26 LAB — MAGNESIUM: MAGNESIUM: 1.9 mg/dL (ref 1.7–2.4)

## 2017-10-26 LAB — TROPONIN I

## 2017-10-26 LAB — GLUCOSE, CAPILLARY
GLUCOSE-CAPILLARY: 110 mg/dL — AB (ref 65–99)
GLUCOSE-CAPILLARY: 102 mg/dL — AB (ref 65–99)
GLUCOSE-CAPILLARY: 114 mg/dL — AB (ref 65–99)
GLUCOSE-CAPILLARY: 133 mg/dL — AB (ref 65–99)

## 2017-10-26 LAB — T4, FREE: FREE T4: 1.04 ng/dL (ref 0.61–1.12)

## 2017-10-26 MED ORDER — ENSURE ENLIVE PO LIQD
237.0000 mL | Freq: Two times a day (BID) | ORAL | Status: DC
  Administered 2017-10-26 – 2017-10-28 (×6): 237 mL via ORAL

## 2017-10-26 MED ORDER — IPRATROPIUM-ALBUTEROL 0.5-2.5 (3) MG/3ML IN SOLN
3.0000 mL | RESPIRATORY_TRACT | Status: DC
  Administered 2017-10-26: 3 mL via RESPIRATORY_TRACT
  Filled 2017-10-26: qty 3

## 2017-10-26 MED ORDER — PREDNISONE 20 MG PO TABS
20.0000 mg | ORAL_TABLET | Freq: Every day | ORAL | Status: DC
  Administered 2017-10-27 – 2017-11-02 (×7): 20 mg via ORAL
  Filled 2017-10-26 (×7): qty 1

## 2017-10-26 MED ORDER — ALBUTEROL SULFATE (2.5 MG/3ML) 0.083% IN NEBU
2.5000 mg | INHALATION_SOLUTION | Freq: Three times a day (TID) | RESPIRATORY_TRACT | Status: DC
Start: 2017-10-26 — End: 2017-11-02
  Administered 2017-10-26 – 2017-11-02 (×18): 2.5 mg via RESPIRATORY_TRACT
  Filled 2017-10-26 (×22): qty 3

## 2017-10-26 NOTE — Progress Notes (Signed)
Initial Nutrition Assessment  DOCUMENTATION CODES:  Not applicable  INTERVENTION:  D/C glucerna and start Ensure Enlive po BID, each supplement provides 350 kcal and 20 grams of protein  NUTRITION DIAGNOSIS:  Increased nutrient needs related to acute illness / increased energy expenditure as evidenced by loss of 6.5% of bw x 2 months  GOAL:  Patient will meet greater than or equal to 90% of their needs  MONITOR:  PO intake, Supplement acceptance, Diet advancement, Weight trends, Labs, I & O's  REASON FOR ASSESSMENT:  Malnutrition Screening Tool    ASSESSMENT:  62 y/o male PMHx HTN, DM2, Depression, Anxiety, COPD. Recently admitted 4/11-4/15 d/t COPD exacerbation after having 2 month history of cough/congestion associated w/ Fever. Represents w/ subjective fever, SOB, severe fatigue and chest pressure, which begun as patient was finishing steroid taper. Admitted for further evaluation/managment.    RD charted on remotely due to +MST screen. Patient reported having had unintentional wt loss, but not having any decrease in his appetite.   Per objective wt history, the patients UBW has been 160-165 lbs for the last couple years. Per notes, pt appears to have been ill for approximately the past 2 months. He was weighed 161 lbs in February, just prior to when he reportedly became ill. He is now 150 lbs. He has lost 10.5 lbs in 2 months, a  loss of 6.5% bw x 2 months.   Despite his weight loss, intake records from his last admission show that he ate well, >/=75% of each meal.  No documented intake yet during this admission.   Wt loss in setting of good intake likely related to heightened energy expenditure. Will start supplements.  Given that his blood sugars are well controlled, will switch pt to Ensure to provide increased kcals/protein.   Physical Exam: Unable to assess at this time  Labs: Albumin: 3.3, GLu: 101, BUN:22. BGs: 100-122 Meds: Glucerna TID-accepting, PPI, Insulin    Recent Labs  Lab 10/21/17 0539 10/25/17 1536 10/26/17 0354  NA  --  136 139  K  --  3.8 3.9  CL  --  99* 101  CO2  --  25 25  BUN  --  28* 22*  CREATININE 1.06 0.79 0.84  CALCIUM  --  9.5 8.8*  MG  --   --  1.9  GLUCOSE  --  149* 101*   NUTRITION - FOCUSED PHYSICAL EXAM: Unable to assess  Diet Order:  Diet heart healthy/carb modified Room service appropriate? Yes; Fluid consistency: Thin  EDUCATION NEEDS:  No education needs have been identified at this time  Skin:  Skin Assessment: Reviewed RN Assessment  Last BM:  4/19  Height:  Ht Readings from Last 1 Encounters:  10/25/17 5' 7"  (1.702 m)   Weight:  Wt Readings from Last 1 Encounters:  10/25/17 150 lb 5.7 oz (68.2 kg)   Wt Readings from Last 10 Encounters:  10/25/17 150 lb 5.7 oz (68.2 kg)  10/17/17 145 lb (65.8 kg)  09/19/17 155 lb (70.3 kg)  08/20/17 160 lb 14.4 oz (73 kg)  08/05/17 165 lb 9.6 oz (75.1 kg)  05/07/17 160 lb (72.6 kg)  02/21/17 165 lb (74.8 kg)  01/31/17 164 lb (74.4 kg)  11/15/16 164 lb (74.4 kg)  07/26/16 159 lb (72.1 kg)   Ideal Body Weight:  67.27 kg  BMI:  Body mass index is 23.55 kg/m.  Estimated Nutritional Needs:  Kcal:  1850-2050 kcals (27-30 kcals/kg bw) Protein:  82-95g Pro  Fluid:  >  2.1 L fluid (71m/kg bw)  NBurtis JunesRD, LDN, CNSC Clinical Nutrition Available Tues-Sat via Pager: [phone]/20/2019 9:14 AM

## 2017-10-26 NOTE — Progress Notes (Signed)
Pharmacy Antibiotic Note  [name] is a 62 y.o. male admitted on 10/25/2017 with sepsis.  Pharmacy has been consulted for Vancomycin and Cefepime dosing.  Plan: Cefepime 1gm IV q8h Vancomycin 1gm IV q12hrs Check trough at steady state Monitor labs, progress, c/s Deescalate ABX when appropriate  Height: 5' 7"  (170.2 cm) Weight: 150 lb 5.7 oz (68.2 kg) IBW/kg (Calculated) : 66.1  Temp (24hrs), Avg:99.2 F (37.3 C), Min:98.3 F (36.8 C), Max:100.2 F (37.9 C)  Recent Labs  Lab 10/21/17 0539 10/25/17 1536 10/25/17 1544 10/26/17 0354  WBC  --  5.7  --  5.0  CREATININE 1.06 0.79  --  0.84  LATICACIDVEN  --   --  1.26  --     Estimated Creatinine Clearance: 85.2 mL/min (by C-G formula based on SCr of 0.84 mg/dL).    Allergies  Allergen Reactions  . Codeine Rash  . Baclofen     lethargic   . Simvastatin Nausea Only   Antimicrobials this admission: Cefepime 4/19 >>  Vancomycin 4/19 >>   Dose adjustments this admission:  Microbiology results: 4/19 BCx: pending  Thank you for allowing pharmacy to be a part of this patient's care.  Hart Robinsons A 10/26/2017 8:48 AM

## 2017-10-26 NOTE — Progress Notes (Signed)
Patient admitted with FUO.  Currently afebrile.. Sepsis workup negative normal lactic acid normal leukocytosis Sepsis workup negative normal lactic acid normal leukocytosis.. Patient appears anxious had some hypoxia on admission we will add DuoNeb nebulizer as well as prednisone 20 mgAs well as prednisone 20 mg daily for tapering dose. BAYNE FOSNAUGH AES:[id number] DOB: [birth year] DOA: 10/25/2017 PCP: Sinda Du, MD   Physical Exam: Blood pressure 96/60, pulse (!) 120, temperature 100.2 F (37.9 C), temperature source Oral, resp. rate 20, height 5' 7"  (1.702 m), weight 68.2 kg (150 lb 5.7 oz), SpO2 93 %.  Lungs show diminished breath sounds in the bases. Prolonged Expiratory phase no rales wheezes or rhonchi appreciable.   Investigations:  Recent Results (from the past 240 hour(s))  Urine culture     Status: None   Collection Time: 10/17/17 12:51 PM  Result Value Ref Range Status   Specimen Description   Final    URINE, CLEAN CATCH Performed at [facility], [address], [place], [place] [address]    Special Requests   Final    Normal Performed at [facility], [address], [place], [place] [address]    Culture   Final    NO GROWTH Performed at [facility], [place] [address] [place], [place] [address]    Report Status 10/19/2017 FINAL  Final  Culture, blood (Routine X 2) w Reflex to ID Panel     Status: None   Collection Time: 10/17/17  1:47 PM  Result Value Ref Range Status   Specimen Description BLOOD LEFT ARM  Final   Special Requests   Final    BOTTLES DRAWN AEROBIC AND ANAEROBIC Blood Culture adequate volume   Culture   Final    NO GROWTH 5 DAYS Performed at [facility], [address] [place], [place] [address]    Report Status 10/22/2017 FINAL  Final  Culture, blood (Routine X 2) w Reflex to ID Panel     Status: None   Collection Time: 10/17/17  1:47 PM  Result Value Ref Range Status   Specimen Description BLOOD RIGHT ARM  Final   Special Requests    Final    BOTTLES DRAWN AEROBIC AND ANAEROBIC Blood Culture adequate volume   Culture   Final    NO GROWTH 5 DAYS Performed at [facility], [address], [place], [place] [address]    Report Status 10/22/2017 FINAL  Final  Blood Culture (routine x 2)     Status: None (Preliminary result)   Collection Time: 10/25/17  3:36 PM  Result Value Ref Range Status   Specimen Description BLOOD RIGHT ANTECUBITAL  Final   Special Requests   Final    BOTTLES DRAWN AEROBIC AND ANAEROBIC Blood Culture adequate volume   Culture   Final    NO GROWTH < 24 HOURS Performed at [facility], [address], [place], [place] [address]    Report Status PENDING  Incomplete  Blood Culture (routine x 2)     Status: None (Preliminary result)   Collection Time: 10/25/17  3:41 PM  Result Value Ref Range Status   Specimen Description BLOOD BLOOD RIGHT ARM  Final   Special Requests   Final    BOTTLES DRAWN AEROBIC AND ANAEROBIC Blood Culture adequate volume   Culture   Final    NO GROWTH < 24 HOURS Performed at [facility], [address] [place], [place] [address]    Report Status PENDING  Incomplete     Basic  Metabolic Panel: Recent Labs    10/25/17 1536 10/26/17 0354  NA 136 139  K 3.8 3.9  CL 99* 101  CO2 25 25  GLUCOSE 149* 101*  BUN 28* 22*  CREATININE 0.79 0.84  CALCIUM 9.5 8.8*  MG  --  1.9   Liver Function Tests: Recent Labs    10/25/17 1536 10/26/17 0354  AST 23 18  ALT 45 38  ALKPHOS 76 59  BILITOT 1.0 1.2  PROT 6.7 6.4*  ALBUMIN 3.6 3.3*     CBC: Recent Labs    10/25/17 1536 10/26/17 0354  WBC 5.7 5.0  NEUTROABS 4.7 4.1  HGB 14.8 14.4  HCT 46.2 45.2  MCV 82.8 83.1  PLT 104* 90*    Dg Chest 2 View  Result Date: 10/25/2017 CLINICAL DATA:  Cough and fever EXAM: CHEST - 2 VIEW COMPARISON:  October 17, 2017 chest radiograph and chest CT August 09, 2017 FINDINGS: Lungs are hyperexpanded. There are calcified granulomas in the right upper lobe. There is no edema or  consolidation. Heart size and pulmonary vascularity are normal. No adenopathy. No evident bone lesions. IMPRESSION: Hyperexpansion without edema or consolidation. Calcified granulomas in right upper lobe. Stable cardiac silhouette. Comment: The nodular opacity seen in the left base on recent CT is not appreciable by radiography. Recommendations made for follow-up CT based on the prior chest CT remain in effect. Electronically Signed   By: Lowella Grip III M.D.   On: 10/25/2017 16:15   Ct Angio Chest Pe W And/or Wo Contrast  Result Date: 10/25/2017 CLINICAL DATA:  Shortness of breath, chest pressure, and fever. EXAM: CT ANGIOGRAPHY CHEST WITH CONTRAST TECHNIQUE: Multidetector CT imaging of the chest was performed using the standard protocol during bolus administration of intravenous contrast. Multiplanar CT image reconstructions and MIPs were obtained to evaluate the vascular anatomy. CONTRAST:  137m ISOVUE-370 IOPAMIDOL (ISOVUE-370) INJECTION 76% COMPARISON:  Lung cancer screening low-dose chest CT 08/09/2017. FINDINGS: Cardiovascular: Pulmonary arterial opacification is adequate without evidence of emboli. The main pulmonary artery is dilated to 3.1 cm diameter, unchanged and which may reflect underlying pulmonary arterial hypertension. Mild thoracic aortic atherosclerosis is noted without aneurysm. There is LAD coronary artery atherosclerosis. Heart size is normal. No pericardial effusion. Mediastinum/Nodes: No enlarged axillary, mediastinal, or hilar lymph nodes. Unremarkable esophagus and thyroid. Lungs/Pleura: No pleural effusion or pneumothorax. Centrilobular and paraseptal emphysema. Calcified granulomas in both lungs, the largest measuring 1.4 cm in the right upper lobe. Evaluation of the lung parenchyma is mildly limited by respiratory motion artifact with scattered areas of mild tree-in-bud nodularity and mild bronchiectasis better shown on the prior study. There is mild atelectasis in both lung  bases. The 25 x 10 mm nodular subpleural opacity in the basilar left lower lobe on the prior study has resolved. Upper Abdomen: Suspected hepatic steatosis. 9 mm low-density lesion laterally in the spleen, unchanged from an abdominal CT from 05/28/2013 and likely benign. Musculoskeletal: Chronic L1 compression fracture with severe height loss centrally. Review of the MIP images confirms the above findings. IMPRESSION: 1. No evidence of pulmonary emboli. 2. Interval resolution of left lower lobe nodular opacity. 3. Aortic Atherosclerosis (ICD10-I70.0) and Emphysema (ICD10-J43.9). Electronically Signed   By: ALogan BoresM.D.   On: 10/25/2017 18:40      Medications:  Impression:  Principal Problem:   Fever, unknown origin Active Problems:   Muscle weakness (generalized)   Non-alcoholic fatty liver disease   Splenomegaly   GERD (gastroesophageal reflux disease)   Thrombocytopenia ([name])  Dyslipidemia   Type 2 diabetes mellitus without complication, without long-term current use of insulin ([name])   Dehydration   Goiter   Suspected Mycobacterial infectious disease     Plan: DuoNeb nebulizer prednisone 20 daily  Consultants:     Procedures   Antibiotics: Maxipime and vancomycin          Time spent:30 min.   LOS: 1 day   Daiel Strohecker M   10/26/2017, 9:51 AM

## 2017-10-27 LAB — CBC WITH DIFFERENTIAL/PLATELET
Basophils Absolute: 0 10*3/uL (ref 0.0–0.1)
Basophils Relative: 0 %
EOS ABS: 0.1 10*3/uL (ref 0.0–0.7)
Eosinophils Relative: 1 %
HEMATOCRIT: 46.1 % (ref 39.0–52.0)
Hemoglobin: 14.8 g/dL (ref 13.0–17.0)
LYMPHS ABS: 0.4 10*3/uL — AB (ref 0.7–4.0)
LYMPHS PCT: 10 %
MCH: 26.6 pg (ref 26.0–34.0)
MCHC: 32.1 g/dL (ref 30.0–36.0)
MCV: 82.9 fL (ref 78.0–100.0)
MONOS PCT: 4 %
Monocytes Absolute: 0.1 10*3/uL (ref 0.1–1.0)
Neutro Abs: 3.1 10*3/uL (ref 1.7–7.7)
Neutrophils Relative %: 85 %
Platelets: 69 10*3/uL — ABNORMAL LOW (ref 150–400)
RBC: 5.56 MIL/uL (ref 4.22–5.81)
RDW: 16.7 % — ABNORMAL HIGH (ref 11.5–15.5)
WBC: 3.6 10*3/uL — ABNORMAL LOW (ref 4.0–10.5)

## 2017-10-27 LAB — COMPREHENSIVE METABOLIC PANEL
ALBUMIN: 3.2 g/dL — AB (ref 3.5–5.0)
ALK PHOS: 60 U/L (ref 38–126)
ALT: 32 U/L (ref 17–63)
AST: 18 U/L (ref 15–41)
Anion gap: 12 (ref 5–15)
BUN: 22 mg/dL — ABNORMAL HIGH (ref 6–20)
CALCIUM: 8.9 mg/dL (ref 8.9–10.3)
CHLORIDE: 101 mmol/L (ref 101–111)
CO2: 25 mmol/L (ref 22–32)
Creatinine, Ser: 0.75 mg/dL (ref 0.61–1.24)
GFR calc Af Amer: >60 mL/min (ref >60)
GFR calc non Af Amer: >60 mL/min (ref >60)
GLUCOSE: 204 mg/dL — AB (ref 65–99)
Potassium: 4.5 mmol/L (ref 3.5–5.1)
SODIUM: 138 mmol/L (ref 135–145)
Total Bilirubin: 1 mg/dL (ref 0.3–1.2)
Total Protein: 6.5 g/dL (ref 6.5–8.1)

## 2017-10-27 LAB — GLUCOSE, CAPILLARY
GLUCOSE-CAPILLARY: 213 mg/dL — AB (ref 65–99)
GLUCOSE-CAPILLARY: 210 mg/dL — AB (ref 65–99)
Glucose-Capillary: 167 mg/dL — ABNORMAL HIGH (ref 65–99)
Glucose-Capillary: 127 mg/dL — ABNORMAL HIGH (ref 65–99)

## 2017-10-27 LAB — MAGNESIUM: Magnesium: 2 mg/dL (ref 1.7–2.4)

## 2017-10-27 LAB — RPR: RPR: NONREACTIVE

## 2017-10-27 LAB — T3: T3 TOTAL: 80 ng/dL (ref 71–180)

## 2017-10-27 MED ORDER — PAROXETINE HCL 20 MG PO TABS
20.0000 mg | ORAL_TABLET | Freq: Every day | ORAL | Status: DC
  Administered 2017-10-27 – 2017-11-02 (×7): 20 mg via ORAL
  Filled 2017-10-27 (×7): qty 1

## 2017-10-27 MED ORDER — ORAL CARE MOUTH RINSE
15.0000 mL | Freq: Two times a day (BID) | OROMUCOSAL | Status: DC
  Administered 2017-10-30 – 2017-10-31 (×2): 15 mL via OROMUCOSAL

## 2017-10-27 NOTE — Progress Notes (Signed)
PT had anxiety attack around 0540. PT become SOB and HR increased. Continue to watch HR and respiratory called for PRN neb. PT BP low baseline. PT states his heart rate continues to run high

## 2017-10-27 NOTE — Progress Notes (Signed)
Patient and wife exceedingly anxious.  Thyroid functions within normal parameters.  Patient has multiple somatic complaints palpitations weakness unsteadiness of gait.  Prednisone 20 mg added empirically yesterday.  We will add Paxil 20 mg p.o. daily today repeat CBC and be met in a.m. and physical therapy consult for strengthening and ambulation.  Thyroid ultrasound as well as thyroid function within normal parameters revealing normal heterogeneous thyroid tissue [name]:[id number] DOB: [birth year] DOA: 10/25/2017 PCP: Sinda Du, MD   Physical Exam: Blood pressure 90/60, pulse (!) 133, temperature 99.6 F (37.6 C), temperature source Oral, resp. rate 20, height 5' 7"  (1.702 m), weight 68.2 kg (150 lb 5.7 oz), SpO2 92 %.  Lungs show prolonged expiratory phase no rales wheezes or rhonchi appreciable heart regular rhythm no S3-S4 no heaves thrills or rubs abdomen soft nontender bowel sounds normoactive   Investigations:  Recent Results (from the past 240 hour(s))  Urine culture     Status: None   Collection Time: 10/17/17 12:51 PM  Result Value Ref Range Status   Specimen Description   Final    URINE, CLEAN CATCH Performed at [facility], [address] [place], [place] [address]    Special Requests   Final    Normal Performed at [facility], [address], [place], [place] [address]    Culture   Final    NO GROWTH Performed at [facility], [place] [address] [place], [place] [address]    Report Status 10/19/2017 FINAL  Final  Culture, blood (Routine X 2) w Reflex to ID Panel     Status: None   Collection Time: 10/17/17  1:47 PM  Result Value Ref Range Status   Specimen Description BLOOD LEFT ARM  Final   Special Requests   Final    BOTTLES DRAWN AEROBIC AND ANAEROBIC Blood Culture adequate volume   Culture   Final    NO GROWTH 5 DAYS Performed at [facility], [address], [place], [place] [address]    Report Status 10/22/2017 FINAL  Final  Culture, blood  (Routine X 2) w Reflex to ID Panel     Status: None   Collection Time: 10/17/17  1:47 PM  Result Value Ref Range Status   Specimen Description BLOOD RIGHT ARM  Final   Special Requests   Final    BOTTLES DRAWN AEROBIC AND ANAEROBIC Blood Culture adequate volume   Culture   Final    NO GROWTH 5 DAYS Performed at [facility], [address] [place], [place] [address]    Report Status 10/22/2017 FINAL  Final  Blood Culture (routine x 2)     Status: None (Preliminary result)   Collection Time: 10/25/17  3:36 PM  Result Value Ref Range Status   Specimen Description BLOOD RIGHT ANTECUBITAL  Final   Special Requests   Final    BOTTLES DRAWN AEROBIC AND ANAEROBIC Blood Culture adequate volume   Culture   Final    NO GROWTH 2 DAYS Performed at [facility], [address] [place], [place] [address]    Report Status PENDING  Incomplete  Blood Culture (routine x 2)     Status: None (Preliminary result)   Collection Time: 10/25/17  3:41 PM  Result Value Ref Range Status   Specimen Description BLOOD BLOOD RIGHT ARM  Final   Special Requests   Final    BOTTLES DRAWN AEROBIC AND ANAEROBIC Blood Culture adequate volume   Culture   Final    NO  GROWTH 2 DAYS Performed at [facility], [address], [place], [place] [address]    Report Status PENDING  Incomplete     Basic Metabolic Panel: Recent Labs    10/26/17 0354 10/27/17 0520  NA 139 138  K 3.9 4.5  CL 101 101  CO2 25 25  GLUCOSE 101* 204*  BUN 22* 22*  CREATININE 0.84 0.75  CALCIUM 8.8* 8.9  MG 1.9 2.0   Liver Function Tests: Recent Labs    10/26/17 0354 10/27/17 0520  AST 18 18  ALT 38 32  ALKPHOS 59 60  BILITOT 1.2 1.0  PROT 6.4* 6.5  ALBUMIN 3.3* 3.2*     CBC: Recent Labs    10/26/17 0354 10/27/17 0520  WBC 5.0 3.6*  NEUTROABS 4.1 3.1  HGB 14.4 14.8  HCT 45.2 46.1  MCV 83.1 82.9  PLT 90* 69*    Dg Chest 2 View  Result Date: 10/25/2017 CLINICAL DATA:  Cough and fever EXAM: CHEST - 2 VIEW  COMPARISON:  October 17, 2017 chest radiograph and chest CT August 09, 2017 FINDINGS: Lungs are hyperexpanded. There are calcified granulomas in the right upper lobe. There is no edema or consolidation. Heart size and pulmonary vascularity are normal. No adenopathy. No evident bone lesions. IMPRESSION: Hyperexpansion without edema or consolidation. Calcified granulomas in right upper lobe. Stable cardiac silhouette. Comment: The nodular opacity seen in the left base on recent CT is not appreciable by radiography. Recommendations made for follow-up CT based on the prior chest CT remain in effect. Electronically Signed   By: Lowella Grip III M.D.   On: 10/25/2017 16:15   Ct Angio Chest Pe W And/or Wo Contrast  Result Date: 10/25/2017 CLINICAL DATA:  Shortness of breath, chest pressure, and fever. EXAM: CT ANGIOGRAPHY CHEST WITH CONTRAST TECHNIQUE: Multidetector CT imaging of the chest was performed using the standard protocol during bolus administration of intravenous contrast. Multiplanar CT image reconstructions and MIPs were obtained to evaluate the vascular anatomy. CONTRAST:  145m ISOVUE-370 IOPAMIDOL (ISOVUE-370) INJECTION 76% COMPARISON:  Lung cancer screening low-dose chest CT 08/09/2017. FINDINGS: Cardiovascular: Pulmonary arterial opacification is adequate without evidence of emboli. The main pulmonary artery is dilated to 3.1 cm diameter, unchanged and which may reflect underlying pulmonary arterial hypertension. Mild thoracic aortic atherosclerosis is noted without aneurysm. There is LAD coronary artery atherosclerosis. Heart size is normal. No pericardial effusion. Mediastinum/Nodes: No enlarged axillary, mediastinal, or hilar lymph nodes. Unremarkable esophagus and thyroid. Lungs/Pleura: No pleural effusion or pneumothorax. Centrilobular and paraseptal emphysema. Calcified granulomas in both lungs, the largest measuring 1.4 cm in the right upper lobe. Evaluation of the lung parenchyma is mildly  limited by respiratory motion artifact with scattered areas of mild tree-in-bud nodularity and mild bronchiectasis better shown on the prior study. There is mild atelectasis in both lung bases. The 25 x 10 mm nodular subpleural opacity in the basilar left lower lobe on the prior study has resolved. Upper Abdomen: Suspected hepatic steatosis. 9 mm low-density lesion laterally in the spleen, unchanged from an abdominal CT from 05/28/2013 and likely benign. Musculoskeletal: Chronic L1 compression fracture with severe height loss centrally. Review of the MIP images confirms the above findings. IMPRESSION: 1. No evidence of pulmonary emboli. 2. Interval resolution of left lower lobe nodular opacity. 3. Aortic Atherosclerosis (ICD10-I70.0) and Emphysema (ICD10-J43.9). Electronically Signed   By: ALogan BoresM.D.   On: 10/25/2017 18:40   [place]yroid  Result Date: 10/26/2017 CLINICAL DATA:  Goiter, thyromegaly EXAM: THYROID ULTRASOUND TECHNIQUE:  Ultrasound examination of the thyroid gland and adjacent soft tissues was performed. COMPARISON:  None. FINDINGS: Parenchymal Echotexture: Mildly heterogenous Isthmus: 0.3 cm thickness Right lobe: 4 x 1.1 x 0.9 cm Left lobe: 4 x 1.4 x 1.4 cm _________________________________________________________ Estimated total number of nodules >/= 1 cm: 0 Number of spongiform nodules >/=  2 cm not described below (TR1): 0 Number of mixed cystic and solid nodules >/= 1.5 cm not described below (TR2): 0 _________________________________________________________ No discrete nodules are seen within the thyroid gland. IMPRESSION: 1. Normal-sized heterogeneous thyroid.  No nodule. The above is in keeping with the ACR TI-RADS recommendations - J Am Coll Radiol 2017;14:587-595. Electronically Signed   By: Lucrezia Europe M.D.   On: 10/26/2017 10:33      Medications:  Impression:  Principal Problem:   Fever, unknown origin Active Problems:   Muscle weakness (generalized)   Non-alcoholic fatty  liver disease   Splenomegaly   GERD (gastroesophageal reflux disease)   Thrombocytopenia ([name])   Dyslipidemia   Type 2 diabetes mellitus without complication, without long-term current use of insulin ([name])   Dehydration   Goiter   Suspected Mycobacterial infectious disease     Plan: CBC and be met in a.m., physical therapy for strengthening and ambulation.  Continue prednisone 20 mg daily.  Add Paxil 20 mg p.o. today and daily attempted to reassure patient and wife that transfer is not in his best interest at present.  Consultants:    Procedures   Antibiotics: Maxipime and vancomycin          Time spent: 45 minutes   LOS: 2 days   Ahkeem Goede M   10/27/2017, 11:01 AM

## 2017-10-28 ENCOUNTER — Encounter (HOSPITAL_COMMUNITY): Payer: Self-pay

## 2017-10-28 LAB — CBC WITH DIFFERENTIAL/PLATELET
Basophils Absolute: 0 10*3/uL (ref 0.0–0.1)
Basophils Relative: 0 %
EOS ABS: 0 10*3/uL (ref 0.0–0.7)
EOS PCT: 1 %
HCT: 42.5 % (ref 39.0–52.0)
Hemoglobin: 13.6 g/dL (ref 13.0–17.0)
LYMPHS ABS: 0.4 10*3/uL — AB (ref 0.7–4.0)
Lymphocytes Relative: 10 %
MCH: 26.4 pg (ref 26.0–34.0)
MCHC: 32 g/dL (ref 30.0–36.0)
MCV: 82.4 fL (ref 78.0–100.0)
MONO ABS: 0.4 10*3/uL (ref 0.1–1.0)
MONOS PCT: 9 %
Neutro Abs: 3.5 10*3/uL (ref 1.7–7.7)
Neutrophils Relative %: 80 %
PLATELETS: 78 10*3/uL — AB (ref 150–400)
RBC: 5.16 MIL/uL (ref 4.22–5.81)
RDW: 16.3 % — AB (ref 11.5–15.5)
WBC: 4.4 10*3/uL (ref 4.0–10.5)

## 2017-10-28 LAB — COMPREHENSIVE METABOLIC PANEL
ALT: 27 U/L (ref 17–63)
AST: 14 U/L — AB (ref 15–41)
Albumin: 2.9 g/dL — ABNORMAL LOW (ref 3.5–5.0)
Alkaline Phosphatase: 54 U/L (ref 38–126)
Anion gap: 10 (ref 5–15)
BUN: 20 mg/dL (ref 6–20)
CO2: 26 mmol/L (ref 22–32)
CREATININE: 0.85 mg/dL (ref 0.61–1.24)
Calcium: 8.4 mg/dL — ABNORMAL LOW (ref 8.9–10.3)
Chloride: 101 mmol/L (ref 101–111)
GFR calc Af Amer: >60 mL/min (ref >60)
Glucose, Bld: 153 mg/dL — ABNORMAL HIGH (ref 65–99)
Potassium: 4.1 mmol/L (ref 3.5–5.1)
Sodium: 137 mmol/L (ref 135–145)
Total Bilirubin: 0.8 mg/dL (ref 0.3–1.2)
Total Protein: 6.1 g/dL — ABNORMAL LOW (ref 6.5–8.1)

## 2017-10-28 LAB — GLUCOSE, CAPILLARY
GLUCOSE-CAPILLARY: 227 mg/dL — AB (ref 65–99)
GLUCOSE-CAPILLARY: 241 mg/dL — AB (ref 65–99)
GLUCOSE-CAPILLARY: 176 mg/dL — AB (ref 65–99)
Glucose-Capillary: 163 mg/dL — ABNORMAL HIGH (ref 65–99)
Glucose-Capillary: 117 mg/dL — ABNORMAL HIGH (ref 65–99)

## 2017-10-28 LAB — VITAMIN D 25 HYDROXY (VIT D DEFICIENCY, FRACTURES): VIT D 25 HYDROXY: 21.2 ng/mL — AB (ref 30.0–100.0)

## 2017-10-28 MED ORDER — ENSURE ENLIVE PO LIQD
237.0000 mL | Freq: Two times a day (BID) | ORAL | Status: DC
  Administered 2017-10-29 – 2017-11-02 (×7): 237 mL via ORAL

## 2017-10-28 NOTE — Progress Notes (Addendum)
Patient given Paxil 20 mg p.o. yesterday for high degree of anxiety as well as somatic issues.  This morning he states he feels much better.  That is the first positive statement in 3-4 days.  2D echo in 2011 revealed normal systolic function grade 1 diastolic dysfunction feel no need to repeat at present.  Currently on vancomycin and Maxepine  empirically since admission as well as prednisone 20 which is a tapering dose from previous admission.  We will continue the same as well as reassurance to patient and wife. [name]:[id number] DOB: 06/20/1956 DOA: 10/25/2017 PCP: Sinda Du, MD   Physical Exam: Blood pressure 96/60, pulse 91, temperature 98.1 F (36.7 C), temperature source Oral, resp. rate 20, height 5' 7"  (1.702 [name]), weight 68.2 kg (150 lb 5.7 oz), SpO2 95 %.  Lungs diminished breath sounds in the bases prolonged expiratory phase scattered occasional rhonchi no rales no wheeze appreciable.  Heart regular rhythm no S3-S4 no heaves thrills or rubs.  Abdomen soft nontender bowel sounds normoactive   Investigations:  Recent Results (from the past 240 hour(s))  Blood Culture (routine x 2)     Status: None (Preliminary result)   Collection Time: 10/25/17  3:36 PM  Result Value Ref Range Status   Specimen Description BLOOD RIGHT ANTECUBITAL  Final   Special Requests   Final    BOTTLES DRAWN AEROBIC AND ANAEROBIC Blood Culture adequate volume   Culture   Final    NO GROWTH 3 DAYS Performed at [facility], [address] [place], [place] [address]    Report Status PENDING  Incomplete  Blood Culture (routine x 2)     Status: None (Preliminary result)   Collection Time: 10/25/17  3:41 PM  Result Value Ref Range Status   Specimen Description BLOOD BLOOD RIGHT ARM  Final   Special Requests   Final    BOTTLES DRAWN AEROBIC AND ANAEROBIC Blood Culture adequate volume   Culture   Final    NO GROWTH 3 DAYS Performed at [facility], [address], [place], [place] [address]     Report Status PENDING  Incomplete     Basic Metabolic Panel: Recent Labs    10/26/17 0354 10/27/17 0520 10/28/17 0420  NA 139 138 137  K 3.9 4.5 4.1  CL 101 101 101  CO2 25 25 26   GLUCOSE 101* 204* 153*  BUN 22* 22* 20  CREATININE 0.84 0.75 0.85  CALCIUM 8.8* 8.9 8.4*  MG 1.9 2.0  --    Liver Function Tests: Recent Labs    10/27/17 0520 10/28/17 0420  AST 18 14*  ALT 32 27  ALKPHOS 60 54  BILITOT 1.0 0.8  PROT 6.5 6.1*  ALBUMIN 3.2* 2.9*     CBC: Recent Labs    10/27/17 0520 10/28/17 0420  WBC 3.6* 4.4  NEUTROABS 3.1 3.5  HGB 14.8 13.6  HCT 46.1 42.5  MCV 82.9 82.4  PLT 69* 78*    US Thyroid  Result Date: 10/26/2017 CLINICAL DATA:  Goiter, thyromegaly EXAM: THYROID ULTRASOUND TECHNIQUE: Ultrasound examination of the thyroid gland and adjacent soft tissues was performed. COMPARISON:  None. FINDINGS: Parenchymal Echotexture: Mildly heterogenous Isthmus: 0.3 cm thickness Right lobe: 4 x 1.1 x 0.9 cm Left lobe: 4 x 1.4 x 1.4 cm _________________________________________________________ Estimated total number of nodules >/= 1 cm: 0 Number of spongiform nodules >/=  2 cm not described below (TR1): 0 Number of mixed cystic and solid nodules >/= 1.5 cm not described  below ([place]): 0 _________________________________________________________ No discrete nodules are seen within the thyroid gland. IMPRESSION: 1. Normal-sized heterogeneous thyroid.  No nodule. The above is in keeping with the ACR TI-RADS recommendations - J Am Coll Radiol 2017;14:587-595. Electronically Signed   By: Lucrezia Europe [name].D.   On: 10/26/2017 10:33      Medications:   Impression:  Principal Problem:   Fever, unknown origin Active Problems:   Muscle weakness (generalized)   Non-alcoholic fatty liver disease   Splenomegaly   GERD (gastroesophageal reflux disease)   Thrombocytopenia ([name])   Dyslipidemia   Type 2 diabetes mellitus without complication, without long-term current use of insulin  ([name])   Dehydration   Goiter   Suspected Mycobacterial infectious disease     Plan: Add Ensure twice daily for hypoproteinemia and hypoalbuminemia.  Continue vancomycin and Zosyn empirically.  Continue Paxil 20 mg p.o. daily for anxiety.  Dr. [name] to return in a.[name].  Consultants:     Procedures   Antibiotics: Vancomycin and Maxepine         Time spent: 30 minutes   LOS: 3 days   [name]   10/28/2017, 8:31 AM

## 2017-10-28 NOTE — Evaluation (Signed)
Physical Therapy Evaluation Patient Details Name: [name] MRN: [id number] DOB: 1956-02-11 Today's Date: 10/28/2017   History of Present Illness  [name] is a 62 y.o. male former smoker (30+ years) with COPD who had recently been discharged from the hospital few days ago for presumed COPD exacerbation.  He had been treated with steroids and antibiotics.  He was sent home on a steroid taper that is nearly completed.  He reports that he started feeling worse as the steroids were being tapered down.  He did have a fever of 102 prior to that last admission.  His chest x-ray did not show any acute findings.  He improved with IV fluid hydration IV antibiotics and steroids.     Clinical Impression  Patient functioning at baseline for functional mobility and gait other than slightly slower than normal speed of cadence.  When walking on room air O2 sats drops to 85%, on 2 LPM O2, sats increased to 89-91%, patient put back on 3 LPM when returned back to bedside - RN notified.  Patient discharged from physical therapy to care of nursing for ambulation daily as tolerated for length of stay.    Follow Up Recommendations No PT follow up    Equipment Recommendations  None recommended by PT    Recommendations for Other Services       Precautions / Restrictions Precautions Precautions: None Restrictions Weight Bearing Restrictions: No      Mobility  Bed Mobility Overal bed mobility: Independent                Transfers Overall transfer level: Independent                  Ambulation/Gait Ambulation/Gait assistance: Modified independent (Device/Increase time) Ambulation Distance (Feet): 150 Feet Assistive device: None Gait Pattern/deviations: WFL(Within Functional Limits) Gait velocity: decreased Gait velocity interpretation: 1.31 - 2.62 ft/sec, indicative of limited community ambulator General Gait Details: grossly WFL except slightly slower than normal  cadence  Stairs            Wheelchair Mobility    Modified Rankin (Stroke Patients Only)       Balance Overall balance assessment: No apparent balance deficits (not formally assessed)                                           Pertinent Vitals/Pain Pain Assessment: No/denies pain    Home Living Family/patient expects to be discharged to:: Private residence Living Arrangements: Spouse/significant other Available Help at Discharge: Family Type of Home: Mobile home Home Access: Stairs to enter Entrance Stairs-Rails: Right;Left;Can reach both Entrance Stairs-Number of Steps: 4 Home Layout: One level Home Equipment: Cane - single point;Walker - 2 wheels;Bedside commode;Shower seat      Prior Function Level of Independence: Independent         Comments: community ambulator, drives     Journalist, newspaper        Extremity/Trunk Assessment   Upper Extremity Assessment Upper Extremity Assessment: Overall WFL for tasks assessed    Lower Extremity Assessment Lower Extremity Assessment: Overall WFL for tasks assessed    Cervical / Trunk Assessment Cervical / Trunk Assessment: Normal  Communication   Communication: No difficulties  Cognition Arousal/Alertness: Awake/alert Behavior During Therapy: WFL for tasks assessed/performed Overall Cognitive Status: Within Functional Limits for tasks assessed  General Comments      Exercises     Assessment/Plan    PT Assessment Patent does not need any further PT services  PT Problem List         PT Treatment Interventions      PT Goals (Current goals can be found in the Care Plan section)  Acute Rehab PT Goals Patient Stated Goal: return home PT Goal Formulation: With patient Time For Goal Achievement: 20-Nov-2017 Potential to Achieve Goals: Good    Frequency     Barriers to discharge        Co-evaluation                AM-PAC PT "6 Clicks" Daily Activity  Outcome Measure Difficulty turning over in bed (including adjusting bedclothes, sheets and blankets)?: None Difficulty moving from lying on back to sitting on the side of the bed? : None Difficulty sitting down on and standing up from a chair with arms (e.g., wheelchair, bedside commode, etc,.)?: None Help needed moving to and from a bed to chair (including a wheelchair)?: None Help needed walking in hospital room?: None Help needed climbing 3-5 steps with a railing? : None 6 Click Score: 24    End of Session   Activity Tolerance: Patient tolerated treatment well Patient left: in bed(seated at bedside) Nurse Communication: Mobility status PT Visit Diagnosis: Unsteadiness on feet (R26.81);Other abnormalities of gait and mobility (R26.89);Muscle weakness (generalized) (M62.81)    Time: 6681-5947 PT Time Calculation (min) (ACUTE ONLY): 20 min   Charges:   PT Evaluation $PT Eval Low Complexity: 1 Low PT Treatments $Therapeutic Activity: 8-22 mins   PT G Codes:        12:20 PM, 11/20/17 Lonell Grandchild, MPT Physical Therapist with [facility] [phone] office [phone] mobile phone

## 2017-10-29 ENCOUNTER — Inpatient Hospital Stay (HOSPITAL_COMMUNITY): Payer: PPO

## 2017-10-29 DIAGNOSIS — R0600 Dyspnea, unspecified: Secondary | ICD-10-CM

## 2017-10-29 LAB — SEDIMENTATION RATE: Sed Rate: 50 mm/hr — ABNORMAL HIGH (ref 0–16)

## 2017-10-29 LAB — GLUCOSE, CAPILLARY
GLUCOSE-CAPILLARY: 151 mg/dL — AB (ref 65–99)
GLUCOSE-CAPILLARY: 292 mg/dL — AB (ref 65–99)
GLUCOSE-CAPILLARY: 181 mg/dL — AB (ref 65–99)
Glucose-Capillary: 158 mg/dL — ABNORMAL HIGH (ref 65–99)

## 2017-10-29 LAB — ECHOCARDIOGRAM COMPLETE
HEIGHTINCHES: 67 in
WEIGHTICAEL: 2405.66 [oz_av]

## 2017-10-29 LAB — COMPREHENSIVE METABOLIC PANEL
ALBUMIN: 3 g/dL — AB (ref 3.5–5.0)
ALT: 27 U/L (ref 17–63)
AST: 16 U/L (ref 15–41)
Alkaline Phosphatase: 61 U/L (ref 38–126)
Anion gap: 9 (ref 5–15)
BUN: 18 mg/dL (ref 6–20)
CHLORIDE: 103 mmol/L (ref 101–111)
CO2: 27 mmol/L (ref 22–32)
Calcium: 8.6 mg/dL — ABNORMAL LOW (ref 8.9–10.3)
Creatinine, Ser: 0.75 mg/dL (ref 0.61–1.24)
GFR calc Af Amer: >60 mL/min (ref >60)
GLUCOSE: 173 mg/dL — AB (ref 65–99)
Potassium: 4.7 mmol/L (ref 3.5–5.1)
SODIUM: 139 mmol/L (ref 135–145)
Total Bilirubin: 0.9 mg/dL (ref 0.3–1.2)
Total Protein: 6.5 g/dL (ref 6.5–8.1)

## 2017-10-29 LAB — C-REACTIVE PROTEIN: CRP: 7.3 mg/dL — AB (ref <1.0)

## 2017-10-29 NOTE — Progress Notes (Signed)
Pharmacy Antibiotic Note  [name] is a 62 y.o. male admitted on 10/25/2017 with sepsis.  Pharmacy has been consulted for Vancomycin and Cefepime dosing.  Plan: Cefepime 1gm IV q8h Vancomycin 1gm IV q12hrs Vanco trough 4/24 0530 Monitor labs, progress, c/s Deescalate ABX when appropriate  Height: 5' 7"  (170.2 cm) Weight: 150 lb 5.7 oz (68.2 kg) IBW/kg (Calculated) : 66.1  Temp (24hrs), Avg:97.8 F (36.6 C), Min:97.6 F (36.4 C), Max:98 F (36.7 C)  Recent Labs  Lab 10/25/17 1536 10/25/17 1544 10/26/17 0354 10/27/17 0520 10/28/17 0420 10/29/17 0717  WBC 5.7  --  5.0 3.6* 4.4  --   CREATININE 0.79  --  0.84 0.75 0.85 0.75  LATICACIDVEN  --  1.26  --   --   --   --     Estimated Creatinine Clearance: 89.5 mL/min (by C-G formula based on SCr of 0.75 mg/dL).    Allergies  Allergen Reactions  . Codeine Rash  . Baclofen     lethargic   . Simvastatin Nausea Only   Antimicrobials this admission: Cefepime 4/19 >>  Vancomycin 4/19 >>   Dose adjustments this admission:  Microbiology results: 4/19 BCx: NG x 4 days Sputum: pending  Thank you for allowing pharmacy to be a part of this patient's care.  Ramond Craver 10/29/2017 3:00 PM

## 2017-10-29 NOTE — Care Management Note (Signed)
Case Management Note  Patient Details  Name: [name] MRN: [id number] Date of Birth: [birth year]  Subjective/Objective:     From home with wife. ind pta. PT recommends no HH f/u. Pt qualifies for home oxygen at this time. feeling worse today, now producing sputum, MD has ordered C&S.                Action/Plan: CM will cont to follow, anticipate DC home with self care. Will need home O2 assessment prior to DC.   Expected Discharge Date:  10/28/17               Expected Discharge Plan:  Home/Self Care  In-House Referral:  NA  Discharge planning Services  CM Consult  Status of Service:  In process, will continue to follow  If discussed at Long Length of Stay Meetings, dates discussed:    Additional Comments:  Sherald Barge, RN 10/29/2017, 9:16 AM

## 2017-10-29 NOTE — Progress Notes (Signed)
Subjective: He says he feels like his breathing is worse.  He is now coughing up some sputum.  No fever.  No other new complaints  Objective: Vital signs in last 24 hours: Temp:  [97.9 F (36.6 C)-98.9 F (37.2 C)] 97.9 F (36.6 C) (04/23 0546) Pulse Rate:  [89-107] 89 (04/23 0546) Resp:  [18-20] 18 (04/22 2247) BP: (90-99)/(44-58) 93/58 (04/23 0546) SpO2:  [89 %-95 %] 94 % (04/23 0546) FiO2 (%):  [95 %] 95 % (04/23 0739) Weight change:  Last BM Date: 10/28/17  Intake/Output from previous day: 04/22 0701 - 04/23 0700 In: 1320 [P.O.:720; IV Piggyback:600] Out: 1700 [Urine:1700]  PHYSICAL EXAM General appearance: alert, cooperative and no distress Resp: rales bibasilar Cardio: regular rate and rhythm, S1, S2 normal, no murmur, click, rub or gallop GI: soft, non-tender; bowel sounds normal; no masses,  no organomegaly Extremities: extremities normal, atraumatic, no cyanosis or edema  Lab Results:  Results for orders placed or performed during the hospital encounter of 10/25/17 (from the past 48 hour(s))  Glucose, capillary     Status: Abnormal   Collection Time: 10/27/17 11:32 AM  Result Value Ref Range   Glucose-Capillary 167 (H) 65 - 99 mg/dL  Glucose, capillary     Status: Abnormal   Collection Time: 10/27/17  4:57 PM  Result Value Ref Range   Glucose-Capillary 127 (H) 65 - 99 mg/dL  Glucose, capillary     Status: Abnormal   Collection Time: 10/27/17  8:58 PM  Result Value Ref Range   Glucose-Capillary 210 (H) 65 - 99 mg/dL  Comprehensive metabolic panel     Status: Abnormal   Collection Time: 10/28/17  4:20 AM  Result Value Ref Range   Sodium 137 135 - 145 mmol/L   Potassium 4.1 3.5 - 5.1 mmol/L   Chloride 101 101 - 111 mmol/L   CO2 26 22 - 32 mmol/L   Glucose, Bld 153 (H) 65 - 99 mg/dL   BUN 20 6 - 20 mg/dL   Creatinine, Ser 0.85 0.61 - 1.24 mg/dL   Calcium 8.4 (L) 8.9 - 10.3 mg/dL   Total Protein 6.1 (L) 6.5 - 8.1 g/dL   Albumin 2.9 (L) 3.5 - 5.0 g/dL   AST  14 (L) 15 - 41 U/L   ALT 27 17 - 63 U/L   Alkaline Phosphatase 54 38 - 126 U/L   Total Bilirubin 0.8 0.3 - 1.2 mg/dL   GFR calc non Af Amer >60 >60 mL/min   GFR calc Af Amer >60 >60 mL/min    Comment: (NOTE) The eGFR has been calculated using the CKD EPI equation. This calculation has not been validated in all clinical situations. eGFR's persistently <60 mL/min signify possible Chronic Kidney Disease.    Anion gap 10 5 - 15    Comment: Performed at [facility], [address] [place], [place] [address]  CBC WITH DIFFERENTIAL     Status: Abnormal   Collection Time: 10/28/17  4:20 AM  Result Value Ref Range   WBC 4.4 4.0 - 10.5 K/uL   RBC 5.16 4.22 - 5.81 MIL/uL   Hemoglobin 13.6 13.0 - 17.0 g/dL   HCT 42.5 39.0 - 52.0 %   MCV 82.4 78.0 - 100.0 fL   MCH 26.4 26.0 - 34.0 pg   MCHC 32.0 30.0 - 36.0 g/dL   RDW 16.3 (H) 11.5 - 15.5 %   Platelets 78 (L) 150 - 400 K/uL    Comment: CONSISTENT WITH PREVIOUS RESULT SPECIMEN CHECKED FOR  CLOTS    Neutrophils Relative % 80 %   Neutro Abs 3.5 1.7 - 7.7 K/uL   Lymphocytes Relative 10 %   Lymphs Abs 0.4 (L) 0.7 - 4.0 K/uL   Monocytes Relative 9 %   Monocytes Absolute 0.4 0.1 - 1.0 K/uL   Eosinophils Relative 1 %   Eosinophils Absolute 0.0 0.0 - 0.7 K/uL   Basophils Relative 0 %   Basophils Absolute 0.0 0.0 - 0.1 K/uL    Comment: Performed at [facility], [address] [place], [place] [address]  Glucose, capillary     Status: Abnormal   Collection Time: 10/28/17  7:42 AM  Result Value Ref Range   Glucose-Capillary 163 (H) 65 - 99 mg/dL  Glucose, capillary     Status: Abnormal   Collection Time: 10/28/17 11:25 AM  Result Value Ref Range   Glucose-Capillary 117 (H) 65 - 99 mg/dL  Glucose, capillary     Status: Abnormal   Collection Time: 10/28/17  1:57 PM  Result Value Ref Range   Glucose-Capillary 227 (H) 65 - 99 mg/dL  Glucose, capillary     Status: Abnormal   Collection Time: 10/28/17  4:21 PM  Result Value Ref Range    Glucose-Capillary 241 (H) 65 - 99 mg/dL  Glucose, capillary     Status: Abnormal   Collection Time: 10/28/17  9:33 PM  Result Value Ref Range   Glucose-Capillary 176 (H) 65 - 99 mg/dL  Comprehensive metabolic panel     Status: Abnormal   Collection Time: 10/29/17  7:17 AM  Result Value Ref Range   Sodium 139 135 - 145 mmol/L   Potassium 4.7 3.5 - 5.1 mmol/L   Chloride 103 101 - 111 mmol/L   CO2 27 22 - 32 mmol/L   Glucose, Bld 173 (H) 65 - 99 mg/dL   BUN 18 6 - 20 mg/dL   Creatinine, Ser 0.75 0.61 - 1.24 mg/dL   Calcium 8.6 (L) 8.9 - 10.3 mg/dL   Total Protein 6.5 6.5 - 8.1 g/dL   Albumin 3.0 (L) 3.5 - 5.0 g/dL   AST 16 15 - 41 U/L   ALT 27 17 - 63 U/L   Alkaline Phosphatase 61 38 - 126 U/L   Total Bilirubin 0.9 0.3 - 1.2 mg/dL   GFR calc non Af Amer >60 >60 mL/min   GFR calc Af Amer >60 >60 mL/min    Comment: (NOTE) The eGFR has been calculated using the CKD EPI equation. This calculation has not been validated in all clinical situations. eGFR's persistently <60 mL/min signify possible Chronic Kidney Disease.    Anion gap 9 5 - 15    Comment: Performed at [facility], [address] [place], [place] [address]  Glucose, capillary     Status: Abnormal   Collection Time: 10/29/17  7:32 AM  Result Value Ref Range   Glucose-Capillary 151 (H) 65 - 99 mg/dL   Comment 1 Notify RN    Comment 2 Document in Chart     ABGS No results for input(s): PHART, PO2ART, TCO2, HCO3 in the last 72 hours.  Invalid input(s): PCO2 CULTURES Recent Results (from the past 240 hour(s))  Blood Culture (routine x 2)     Status: None (Preliminary result)   Collection Time: 10/25/17  3:36 PM  Result Value Ref Range Status   Specimen Description BLOOD RIGHT ANTECUBITAL  Final   Special Requests   Final    BOTTLES DRAWN AEROBIC AND ANAEROBIC Blood Culture adequate volume  Culture   Final    NO GROWTH 3 DAYS Performed at [facility], [address] [place], [place] [address]    Report Status  PENDING  Incomplete  Blood Culture (routine x 2)     Status: None (Preliminary result)   Collection Time: 10/25/17  3:41 PM  Result Value Ref Range Status   Specimen Description BLOOD BLOOD RIGHT ARM  Final   Special Requests   Final    BOTTLES DRAWN AEROBIC AND ANAEROBIC Blood Culture adequate volume   Culture   Final    NO GROWTH 3 DAYS Performed at [facility], [address], [place], [place] [address]    Report Status PENDING  Incomplete   Studies/Results: No results found.  Medications:  Prior to Admission:  Medications Prior to Admission  Medication Sig Dispense Refill Last Dose  . acetaminophen (TYLENOL) 500 MG tablet Take 1,000 mg by mouth daily as needed for headache.   10/16/2017 at Unknown time  . ALPRAZolam (XANAX) 0.25 MG tablet Take 0.25 mg by mouth 2 (two) times daily.    Past Week at Unknown time  . Apple Cider Vinegar 600 MG CAPS Take 2 capsules by mouth daily.   Past Week at Unknown time  . Artificial Tear Ointment (REFRESH P.M. OP) Apply 1 application to eye daily as needed (dry eye). As directed    Past Week at Unknown time  . aspirin EC 81 MG tablet Take 81 mg by mouth at bedtime.    Past Week at Unknown time  . atorvastatin (LIPITOR) 40 MG tablet Take 1 tablet (40 mg total) by mouth daily. 90 tablet 3 Past Week at Unknown time  . cefdinir (OMNICEF) 300 MG capsule Take 1 capsule (300 mg total) by mouth 2 (two) times daily. 14 capsule 0 Past Week at Unknown time  . Choline Fenofibrate (FENOFIBRIC ACID) 135 MG CPDR Take 1 capsule by mouth daily. 90 capsule 3 Past Week at Unknown time  . clotrimazole-betamethasone (LOTRISONE) cream Apply 1 application topically 2 (two) times daily as needed (psoriasis).   Past Week at Unknown time  . diltiazem (CARDIZEM CD) 240 MG 24 hr capsule Take 1 capsule (240 mg total) by mouth daily. 90 capsule 3 Past Week at Unknown time  . fluticasone (FLONASE) 50 MCG/ACT nasal spray Place 2 sprays into both nostrils daily as needed for  allergies.    Past Week at Unknown time  . gabapentin (NEURONTIN) 300 MG capsule Take 1 capsule by mouth at bedtime.  5 Past Week at Unknown time  . HYDROcodone-acetaminophen (NORCO/VICODIN) 5-325 MG per tablet Take 1 tablet by mouth every 8 (eight) hours as needed for moderate pain. (Patient taking differently: Take 1 tablet by mouth daily as needed for moderate pain. ) 90 tablet 0 Past Week at Unknown time  . JARDIANCE 25 MG TABS tablet Take 25 mg by mouth every evening.   3 Past Week at Unknown time  . Multiple Vitamin (MULITIVITAMIN WITH MINERALS) TABS Take 1 tablet by mouth at bedtime.    Past Week at Unknown time  . pantoprazole (PROTONIX) 40 MG tablet Take 1 tablet (40 mg total) by mouth daily. 90 tablet 3 Past Week at Unknown time  . predniSONE (DELTASONE) 10 MG tablet 80fr 1 day,3x1d,2x1d,1x1d 10 tablet 0 Past Week at Unknown time  . PROAIR HFA 108 (90 Base) MCG/ACT inhaler Inhale 2 puffs into the lungs every 4 (four) hours as needed for wheezing or shortness of breath.   12 Past Week at  Unknown time  . SitaGLIPtin-MetFORMIN HCl (JANUMET XR) [phone] MG TB24 Take 1 tablet by mouth daily.   Past Week at Unknown time  . tamsulosin (FLOMAX) 0.4 MG CAPS capsule Take 0.4 mg by mouth daily.   Past Week at Unknown time  . tiotropium (SPIRIVA) 18 MCG inhalation capsule Place 18 mcg into inhaler and inhale daily.   Past Week at Unknown time   Scheduled: . albuterol  2.5 mg Nebulization TID  . aspirin EC  81 mg Oral QHS  . atorvastatin  40 mg Oral q1800  . diltiazem  240 mg Oral Daily  . enoxaparin (LOVENOX) injection  40 mg Subcutaneous Q24H  . feeding supplement (ENSURE ENLIVE)  237 mL Oral BID BM  . feeding supplement (ENSURE ENLIVE)  237 mL Oral BID BM  . fenofibrate  160 mg Oral Daily  . gabapentin  300 mg Oral QHS  . insulin aspart  0-15 Units Subcutaneous TID WC  . insulin aspart  0-5 Units Subcutaneous QHS  . insulin aspart  3 Units Subcutaneous TID WC  . mouth rinse  15 mL Mouth  Rinse BID  . pantoprazole  40 mg Oral Daily  . PARoxetine  20 mg Oral Daily  . predniSONE  20 mg Oral Q breakfast  . sodium chloride flush  3 mL Intravenous Q12H  . tamsulosin  0.4 mg Oral Daily  . tiotropium  18 mcg Inhalation Daily   Continuous: . sodium chloride    . ceFEPime (MAXIPIME) IV Stopped (10/29/17 0654)  . vancomycin Stopped (10/29/17 3582)   PPG:FQMKJI chloride, acetaminophen **OR** acetaminophen, albuterol, ALPRAZolam, fluticasone, HYDROcodone-acetaminophen, ondansetron **OR** ondansetron (ZOFRAN) IV, senna-docusate, sodium chloride flush  Assesment: He is been sick for about 8 weeks with shortness of breath and fever.  He has severe COPD at baseline.  He is more short of breath.  He has diabetes which is stable  He has abnormal chest x-ray and there is a question as to whether he has mycobacterial lung disease Principal Problem:   Fever, unknown origin Active Problems:   Muscle weakness (generalized)   Non-alcoholic fatty liver disease   Splenomegaly   GERD (gastroesophageal reflux disease)   Thrombocytopenia ([name])   Dyslipidemia   Type 2 diabetes mellitus without complication, without long-term current use of insulin ([place])   Dehydration   Goiter   Suspected Mycobacterial infectious disease    Plan: Now that he is producing sputum we will check sputum's.  Check sed rate check ANA and rheumatoid factor    LOS: 4 days   Giann Obara L 10/29/2017, 8:17 AM

## 2017-10-29 NOTE — Progress Notes (Signed)
*  PRELIMINARY RESULTS* Echocardiogram 2D Echocardiogram has been performed.  [name] 10/29/2017, 11:24 AM

## 2017-10-30 LAB — RHEUMATOID FACTOR: RHEUMATOID FACTOR: 11.6 [IU]/mL (ref 0.0–13.9)

## 2017-10-30 LAB — CULTURE, BLOOD (ROUTINE X 2)
CULTURE: NO GROWTH
Culture: NO GROWTH
SPECIAL REQUESTS: ADEQUATE
SPECIAL REQUESTS: ADEQUATE

## 2017-10-30 LAB — GLUCOSE, CAPILLARY
GLUCOSE-CAPILLARY: 121 mg/dL — AB (ref 65–99)
GLUCOSE-CAPILLARY: 195 mg/dL — AB (ref 65–99)
Glucose-Capillary: 310 mg/dL — ABNORMAL HIGH (ref 65–99)
Glucose-Capillary: 228 mg/dL — ABNORMAL HIGH (ref 65–99)

## 2017-10-30 LAB — ANA: ANA: NEGATIVE

## 2017-10-30 LAB — EXPECTORATED SPUTUM ASSESSMENT W GRAM STAIN, RFLX TO RESP C: Special Requests: NORMAL

## 2017-10-30 LAB — VANCOMYCIN, TROUGH: VANCOMYCIN TR: 9 ug/mL — AB (ref 15–20)

## 2017-10-30 LAB — EXPECTORATED SPUTUM ASSESSMENT W REFEX TO RESP CULTURE

## 2017-10-30 MED ORDER — VANCOMYCIN HCL 10 G IV SOLR
1250.0000 mg | Freq: Two times a day (BID) | INTRAVENOUS | Status: DC
  Administered 2017-10-30 – 2017-11-01 (×3): 1250 mg via INTRAVENOUS
  Filled 2017-10-30 (×10): qty 1250

## 2017-10-30 MED ORDER — VANCOMYCIN HCL 10 G IV SOLR
1500.0000 mg | Freq: Once | INTRAVENOUS | Status: AC
  Administered 2017-10-30: 1500 mg via INTRAVENOUS
  Filled 2017-10-30: qty 1500

## 2017-10-30 MED ORDER — GUAIFENESIN ER 600 MG PO TB12
1200.0000 mg | ORAL_TABLET | Freq: Two times a day (BID) | ORAL | Status: DC
  Administered 2017-10-30 – 2017-11-02 (×7): 1200 mg via ORAL
  Filled 2017-10-30 (×7): qty 2

## 2017-10-30 NOTE — Progress Notes (Signed)
Pharmacy Antibiotic Note  [name] is a 62 y.o. male admitted on 10/25/2017 with sepsis.  Pharmacy has been consulted for Vancomycin and Cefepime dosing.  Trough level is below target.    Plan: Cefepime 1gm IV q8h Vancomycin 1524m x 1 then 12545mIV q12hrs Vanco trough 4/24 0530 Monitor labs, progress, c/s Deescalate ABX when appropriate  Height: 5' 7"  (170.2 cm) Weight: 150 lb 5.7 oz (68.2 kg) IBW/kg (Calculated) : 66.1  Temp (24hrs), Avg:98 F (36.7 C), Min:97.6 F (36.4 C), Max:98.5 F (36.9 C)  Recent Labs  Lab 10/25/17 1536 10/25/17 1544 10/26/17 0354 10/27/17 0520 10/28/17 0420 10/29/17 0717 10/30/17 0500  WBC 5.7  --  5.0 3.6* 4.4  --   --   CREATININE 0.79  --  0.84 0.75 0.85 0.75  --   LATICACIDVEN  --  1.26  --   --   --   --   --   VANCOTROUGH  --   --   --   --   --   --  9*    Estimated Creatinine Clearance: 89.5 mL/min (by C-G formula based on SCr of 0.75 mg/dL).    Allergies  Allergen Reactions  . Codeine Rash  . Baclofen     lethargic   . Simvastatin Nausea Only   Antimicrobials this admission: Cefepime 4/19 >>  Vancomycin 4/19 >>   Dose adjustments this admission: 4/24:  Vancomycin increased to 125058m12hrs after repeat bolus  Microbiology results: 4/19 BCx: NG x 4 days Sputum: pending  Thank you for allowing pharmacy to be a part of this patient's care.  HalHart Robinsons4/24/2019 7:28 AM

## 2017-10-30 NOTE — Progress Notes (Signed)
Subjective: He was admitted with fever and sepsis.  He remains on vancomycin and cefepime.  He is still very short of breath.  He said he was coughing up sputum yesterday but today he is not.  He has no other new complaints  Objective: Vital signs in last 24 hours: Temp:  [97.6 F (36.4 C)-98.5 F (36.9 C)] 98.5 F (36.9 C) (04/24 0630) Pulse Rate:  [89-96] 89 (04/24 0630) Resp:  [22] 22 (04/23 1452) BP: (103-109)/(55-66) 103/66 (04/24 0630) SpO2:  [92 %-96 %] 96 % (04/24 0751) Weight change:  Last BM Date: 10/28/17  Intake/Output from previous day: 04/23 0701 - 04/24 0700 In: 840 [P.O.:840] Out: 200 [Urine:200]  PHYSICAL EXAM General appearance: alert, cooperative and mild distress Resp: rhonchi bilaterally Cardio: regular rate and rhythm, S1, S2 normal, no murmur, click, rub or gallop GI: soft, non-tender; bowel sounds normal; no masses,  no organomegaly Extremities: extremities normal, atraumatic, no cyanosis or edema  Lab Results:  Results for orders placed or performed during the hospital encounter of 10/25/17 (from the past 48 hour(s))  Glucose, capillary     Status: Abnormal   Collection Time: 10/28/17 11:25 AM  Result Value Ref Range   Glucose-Capillary 117 (H) 65 - 99 mg/dL  Glucose, capillary     Status: Abnormal   Collection Time: 10/28/17  1:57 PM  Result Value Ref Range   Glucose-Capillary 227 (H) 65 - 99 mg/dL  Glucose, capillary     Status: Abnormal   Collection Time: 10/28/17  4:21 PM  Result Value Ref Range   Glucose-Capillary 241 (H) 65 - 99 mg/dL  Glucose, capillary     Status: Abnormal   Collection Time: 10/28/17  9:33 PM  Result Value Ref Range   Glucose-Capillary 176 (H) 65 - 99 mg/dL  Comprehensive metabolic panel     Status: Abnormal   Collection Time: 10/29/17  7:17 AM  Result Value Ref Range   Sodium 139 135 - 145 mmol/L   Potassium 4.7 3.5 - 5.1 mmol/L   Chloride 103 101 - 111 mmol/L   CO2 27 22 - 32 mmol/L   Glucose, Bld 173 (H) 65 -  99 mg/dL   BUN 18 6 - 20 mg/dL   Creatinine, Ser 0.75 0.61 - 1.24 mg/dL   Calcium 8.6 (L) 8.9 - 10.3 mg/dL   Total Protein 6.5 6.5 - 8.1 g/dL   Albumin 3.0 (L) 3.5 - 5.0 g/dL   AST 16 15 - 41 U/L   ALT 27 17 - 63 U/L   Alkaline Phosphatase 61 38 - 126 U/L   Total Bilirubin 0.9 0.3 - 1.2 mg/dL   GFR calc non Af Amer >60 >60 mL/min   GFR calc Af Amer >60 >60 mL/min    Comment: (NOTE) The eGFR has been calculated using the CKD EPI equation. This calculation has not been validated in all clinical situations. eGFR's persistently <60 mL/min signify possible Chronic Kidney Disease.    Anion gap 9 5 - 15    Comment: Performed at [facility], [address], [place], [place] [address]  Glucose, capillary     Status: Abnormal   Collection Time: 10/29/17  7:32 AM  Result Value Ref Range   Glucose-Capillary 151 (H) 65 - 99 mg/dL   Comment 1 Notify RN    Comment 2 Document in Chart   Sedimentation rate     Status: Abnormal   Collection Time: 10/29/17  8:52 AM  Result Value Ref Range   Sed Rate  50 (H) 0 - 16 mm/hr    Comment: Performed at [facility], [address], [place], [place] [address]  C-reactive protein     Status: Abnormal   Collection Time: 10/29/17  8:52 AM  Result Value Ref Range   CRP 7.3 (H) <1.0 mg/dL    Comment: Performed at [facility] [address] [place], [place] [address]  Rheumatoid factor     Status: None   Collection Time: 10/29/17  8:52 AM  Result Value Ref Range   Rhuematoid fact SerPl-aCnc 11.6 0.0 - 13.9 IU/mL    Comment: (NOTE) Performed At: [facility] [place], [place] [id number] Rush Farmer MD [id number] Performed at [facility], [address], [place], [place] [address]   Glucose, capillary     Status: Abnormal   Collection Time: 10/29/17 11:29 AM  Result Value Ref Range   Glucose-Capillary 158 (H) 65 - 99 mg/dL   Comment 1 Notify RN    Comment 2 Document in Chart   Glucose, capillary     Status: Abnormal    Collection Time: 10/29/17  5:46 PM  Result Value Ref Range   Glucose-Capillary 292 (H) 65 - 99 mg/dL   Comment 1 Notify RN    Comment 2 Document in Chart   Glucose, capillary     Status: Abnormal   Collection Time: 10/29/17  9:16 PM  Result Value Ref Range   Glucose-Capillary 181 (H) 65 - 99 mg/dL   Comment 1 Notify RN    Comment 2 Document in Chart   Vancomycin, trough     Status: Abnormal   Collection Time: 10/30/17  5:00 AM  Result Value Ref Range   Vancomycin Tr 9 (L) 15 - 20 ug/mL    Comment: Performed at [facility], [address], [place], [place] [address]  Glucose, capillary     Status: Abnormal   Collection Time: 10/30/17  7:48 AM  Result Value Ref Range   Glucose-Capillary 121 (H) 65 - 99 mg/dL    ABGS No results for input(s): PHART, PO2ART, TCO2, HCO3 in the last 72 hours.  Invalid input(s): PCO2 CULTURES Recent Results (from the past 240 hour(s))  Blood Culture (routine x 2)     Status: None   Collection Time: 10/25/17  3:36 PM  Result Value Ref Range Status   Specimen Description BLOOD RIGHT ANTECUBITAL  Final   Special Requests   Final    BOTTLES DRAWN AEROBIC AND ANAEROBIC Blood Culture adequate volume   Culture   Final    NO GROWTH 5 DAYS Performed at [facility], [address] [place], [place] [address]    Report Status 10/30/2017 FINAL  Final  Blood Culture (routine x 2)     Status: None   Collection Time: 10/25/17  3:41 PM  Result Value Ref Range Status   Specimen Description BLOOD BLOOD RIGHT ARM  Final   Special Requests   Final    BOTTLES DRAWN AEROBIC AND ANAEROBIC Blood Culture adequate volume   Culture   Final    NO GROWTH 5 DAYS Performed at [facility], [address], [place], [place] [address]    Report Status 10/30/2017 FINAL  Final   Studies/Results: No results found.  Medications:  Prior to Admission:  Medications Prior to Admission  Medication Sig Dispense Refill Last Dose  . acetaminophen (TYLENOL) 500 MG tablet Take  1,000 mg by mouth daily as needed for headache.   10/16/2017 at Unknown time  .  ALPRAZolam (XANAX) 0.25 MG tablet Take 0.25 mg by mouth 2 (two) times daily.    Past Week at Unknown time  . Apple Cider Vinegar 600 MG CAPS Take 2 capsules by mouth daily.   Past Week at Unknown time  . Artificial Tear Ointment (REFRESH P.M. OP) Apply 1 application to eye daily as needed (dry eye). As directed    Past Week at Unknown time  . aspirin EC 81 MG tablet Take 81 mg by mouth at bedtime.    Past Week at Unknown time  . atorvastatin (LIPITOR) 40 MG tablet Take 1 tablet (40 mg total) by mouth daily. 90 tablet 3 Past Week at Unknown time  . cefdinir (OMNICEF) 300 MG capsule Take 1 capsule (300 mg total) by mouth 2 (two) times daily. 14 capsule 0 Past Week at Unknown time  . Choline Fenofibrate (FENOFIBRIC ACID) 135 MG CPDR Take 1 capsule by mouth daily. 90 capsule 3 Past Week at Unknown time  . clotrimazole-betamethasone (LOTRISONE) cream Apply 1 application topically 2 (two) times daily as needed (psoriasis).   Past Week at Unknown time  . diltiazem (CARDIZEM CD) 240 MG 24 hr capsule Take 1 capsule (240 mg total) by mouth daily. 90 capsule 3 Past Week at Unknown time  . fluticasone (FLONASE) 50 MCG/ACT nasal spray Place 2 sprays into both nostrils daily as needed for allergies.    Past Week at Unknown time  . gabapentin (NEURONTIN) 300 MG capsule Take 1 capsule by mouth at bedtime.  5 Past Week at Unknown time  . HYDROcodone-acetaminophen (NORCO/VICODIN) 5-325 MG per tablet Take 1 tablet by mouth every 8 (eight) hours as needed for moderate pain. (Patient taking differently: Take 1 tablet by mouth daily as needed for moderate pain. ) 90 tablet 0 Past Week at Unknown time  . JARDIANCE 25 MG TABS tablet Take 25 mg by mouth every evening.   3 Past Week at Unknown time  . Multiple Vitamin (MULITIVITAMIN WITH MINERALS) TABS Take 1 tablet by mouth at bedtime.    Past Week at Unknown time  . pantoprazole (PROTONIX) 40 MG  tablet Take 1 tablet (40 mg total) by mouth daily. 90 tablet 3 Past Week at Unknown time  . predniSONE (DELTASONE) 10 MG tablet 68fr 1 day,3x1d,2x1d,1x1d 10 tablet 0 Past Week at Unknown time  . PROAIR HFA 108 (90 Base) MCG/ACT inhaler Inhale 2 puffs into the lungs every 4 (four) hours as needed for wheezing or shortness of breath.   12 Past Week at Unknown time  . SitaGLIPtin-MetFORMIN HCl (JANUMET XR) [phone] MG TB24 Take 1 tablet by mouth daily.   Past Week at Unknown time  . tamsulosin (FLOMAX) 0.4 MG CAPS capsule Take 0.4 mg by mouth daily.   Past Week at Unknown time  . tiotropium (SPIRIVA) 18 MCG inhalation capsule Place 18 mcg into inhaler and inhale daily.   Past Week at Unknown time   Scheduled: . albuterol  2.5 mg Nebulization TID  . aspirin EC  81 mg Oral QHS  . atorvastatin  40 mg Oral q1800  . diltiazem  240 mg Oral Daily  . enoxaparin (LOVENOX) injection  40 mg Subcutaneous Q24H  . feeding supplement (ENSURE ENLIVE)  237 mL Oral BID BM  . fenofibrate  160 mg Oral Daily  . gabapentin  300 mg Oral QHS  . guaiFENesin  1,200 mg Oral BID  . insulin aspart  0-15 Units Subcutaneous TID WC  . insulin aspart  0-5 Units Subcutaneous QHS  .  insulin aspart  3 Units Subcutaneous TID WC  . mouth rinse  15 mL Mouth Rinse BID  . pantoprazole  40 mg Oral Daily  . PARoxetine  20 mg Oral Daily  . predniSONE  20 mg Oral Q breakfast  . sodium chloride flush  3 mL Intravenous Q12H  . tamsulosin  0.4 mg Oral Daily  . tiotropium  18 mcg Inhalation Daily   Continuous: . sodium chloride    . ceFEPime (MAXIPIME) IV Stopped (10/30/17 0630)  . vancomycin    . vancomycin 1,500 mg (10/30/17 0836)   NFA:OZHYQM chloride, acetaminophen **OR** acetaminophen, albuterol, ALPRAZolam, fluticasone, HYDROcodone-acetaminophen, ondansetron **OR** ondansetron (ZOFRAN) IV, senna-docusate, sodium chloride flush  Assesment: He was admitted with fever and is not clear what that is from but is presumably from a  pulmonary source.  He is still short of breath.  He remains on antibiotics.  I had him do echocardiogram which was essentially normal and had him to sed rate CRP rheumatoid factor all of which look okay. Principal Problem:   Fever, unknown origin Active Problems:   Muscle weakness (generalized)   Non-alcoholic fatty liver disease   Splenomegaly   GERD (gastroesophageal reflux disease)   Thrombocytopenia ([name])   Dyslipidemia   Type 2 diabetes mellitus without complication, without long-term current use of insulin ([name])   Dehydration   Goiter   Suspected Mycobacterial infectious disease    Plan: Continue treatments.  I would like to see a sputum done for routine culture but also for potential Mycobacterium avium.    LOS: 5 days   Sung Parodi L 10/30/2017, 8:37 AM

## 2017-10-31 LAB — GLUCOSE, CAPILLARY
GLUCOSE-CAPILLARY: 263 mg/dL — AB (ref 65–99)
GLUCOSE-CAPILLARY: 192 mg/dL — AB (ref 65–99)
Glucose-Capillary: 117 mg/dL — ABNORMAL HIGH (ref 65–99)
Glucose-Capillary: 296 mg/dL — ABNORMAL HIGH (ref 65–99)

## 2017-10-31 NOTE — Progress Notes (Signed)
Inpatient Diabetes Program Recommendations  AACE/ADA: New Consensus Statement on Inpatient Glycemic Control (2015)  Target Ranges:  Prepandial:   less than 140 mg/dL      Peak postprandial:   less than 180 mg/dL (1-2 hours)      Critically ill patients:  140 - 180 mg/dL   Results for STILLMAN, BUENGER (MRN [id number]) as of 10/31/2017 08:43  Ref. Range 10/30/2017 07:48 10/30/2017 11:37 10/30/2017 16:09 10/30/2017 21:14 10/31/2017 07:26  Glucose-Capillary Latest Ref Range: 65 - 99 mg/dL 121 (H) 195 (H) 310 (H) 228 (H) 117 (H)   Review of Glycemic Control  Diabetes history: DM2 Outpatient Diabetes medications: Jardiance 25 mg QPM, Janumet XR [phone] mg TID Current orders for Inpatient glycemic control: Novolog 0-15 units TID with meals, Novolog 0-5 units QHS, Novolog 3 units TID with meals for meal coverage; Prednisone 20 mg QAM  Inpatient Diabetes Program Recommendations: Insulin - Meal Coverage: Noted post prandial glucose is consistently elevated despite Novolog 3 units TID for meal coverage. If steroids are continued, please consider increasing meal coverage to Novolog 6 units TID with meals.  Thanks, Barnie Alderman, RN, MSN, CDE Diabetes Coordinator Inpatient Diabetes Program [phone] (Team Pager from 8am to 5pm)

## 2017-10-31 NOTE — Care Management Note (Signed)
Case Management Note  Patient Details  Name: [name] MRN: [id number] Date of Birth: Jan 16, 1956   If discussed at Long Length of Stay Meetings, dates discussed:  10/31/17  Additional Comments:  Sherald Barge, RN 10/31/2017, 12:07 PM

## 2017-10-31 NOTE — Progress Notes (Signed)
Subjective: He says he feels better.  He is less short of breath.  He has not had any fever overnight.  He was able to obtain sputum yesterday.  Objective: Vital signs in last 24 hours: Temp:  [97.8 F (36.6 C)-98.2 F (36.8 C)] 97.8 F (36.6 C) (04/25 0629) Pulse Rate:  [97-103] 97 (04/25 0629) Resp:  [22-24] 24 (04/24 2047) BP: (106-110)/(59-60) 106/60 (04/25 0629) SpO2:  [91 %-95 %] 92 % (04/25 0804) Weight change:  Last BM Date: 10/30/17  Intake/Output from previous day: 04/24 0701 - 04/25 0700 In: 930 [P.O.:480; IV Piggyback:450] Out: 1200 [Urine:1200]  PHYSICAL EXAM General appearance: alert, cooperative and no distress Resp: His chest is considerably clearer today than previously Cardio: regular rate and rhythm, S1, S2 normal, no murmur, click, rub or gallop GI: soft, non-tender; bowel sounds normal; no masses,  no organomegaly Extremities: extremities normal, atraumatic, no cyanosis or edema  Lab Results:  Results for orders placed or performed during the hospital encounter of 10/25/17 (from the past 48 hour(s))  Sedimentation rate     Status: Abnormal   Collection Time: 10/29/17  8:52 AM  Result Value Ref Range   Sed Rate 50 (H) 0 - 16 mm/hr    Comment: Performed at [facility], [address] [place], [place] [address]  C-reactive protein     Status: Abnormal   Collection Time: 10/29/17  8:52 AM  Result Value Ref Range   CRP 7.3 (H) <1.0 mg/dL    Comment: Performed at [facility], [address] [place], [place] [address]  ANA     Status: None   Collection Time: 10/29/17  8:52 AM  Result Value Ref Range   Anit Nuclear Antibody(ANA) Negative Negative    Comment: (NOTE) Performed At: [facility] [place], [place] [id number] Rush Farmer MD [id number] Performed at [facility], [address], [place], [place] [address]   Rheumatoid factor     Status: None   Collection Time: 10/29/17  8:52 AM  Result Value Ref Range   Rhuematoid fact SerPl-aCnc 11.6 0.0 - 13.9 IU/mL    Comment: (NOTE) Performed At: [facility] [place], [place] [id number] Rush Farmer MD [id number] Performed at [facility], [address], [place], [place] [address]   Glucose, capillary     Status: Abnormal   Collection Time: 10/29/17 11:29 AM  Result Value Ref Range   Glucose-Capillary 158 (H) 65 - 99 mg/dL   Comment 1 Notify RN    Comment 2 Document in Chart   Culture, expectorated sputum-assessment     Status: None   Collection Time: 10/29/17  1:19 PM  Result Value Ref Range   Specimen Description EXPECTORATED SPUTUM    Special Requests Normal    Sputum evaluation      THIS SPECIMEN IS ACCEPTABLE FOR SPUTUM CULTURE Performed at [facility], [address], [place], [place] [address]    Report Status 10/30/2017 FINAL   Culture, respiratory (NON-Expectorated)     Status: None (Preliminary result)   Collection Time: 10/29/17  1:19 PM  Result Value Ref Range   Specimen Description      EXPECTORATED SPUTUM Performed at [facility], [address] [place], [place] [address]    Special Requests      Normal Reflexed from [phone] Performed at [facility], [address] [place], [place] [address]    Gram Stain      ABUNDANT WBC PRESENT,BOTH PMN [place]  Performed at [facility], [place] [address], [place], [place] [address]    Culture PENDING    Report Status PENDING   Glucose, capillary     Status: Abnormal   Collection Time: 10/29/17  5:46 PM  Result Value Ref Range   Glucose-Capillary 292 (H) 65 - 99 mg/dL   Comment 1 Notify RN    Comment 2 Document in Chart   Glucose, capillary     Status: Abnormal   Collection Time: 10/29/17  9:16 PM  Result Value Ref Range   Glucose-Capillary 181 (H) 65 - 99 mg/dL   Comment 1 Notify RN    Comment 2 Document in Chart   Vancomycin, trough     Status: Abnormal   Collection Time: 10/30/17  5:00 AM  Result Value Ref Range    Vancomycin Tr 9 (L) 15 - 20 ug/mL    Comment: Performed at [facility], [address], [place], [place] [address]  Glucose, capillary     Status: Abnormal   Collection Time: 10/30/17  7:48 AM  Result Value Ref Range   Glucose-Capillary 121 (H) 65 - 99 mg/dL  Glucose, capillary     Status: Abnormal   Collection Time: 10/30/17 11:37 AM  Result Value Ref Range   Glucose-Capillary 195 (H) 65 - 99 mg/dL  Glucose, capillary     Status: Abnormal   Collection Time: 10/30/17  4:09 PM  Result Value Ref Range   Glucose-Capillary 310 (H) 65 - 99 mg/dL   Comment 1 Notify RN    Comment 2 Document in Chart   Glucose, capillary     Status: Abnormal   Collection Time: 10/30/17  9:14 PM  Result Value Ref Range   Glucose-Capillary 228 (H) 65 - 99 mg/dL   Comment 1 Notify RN    Comment 2 Document in Chart   Glucose, capillary     Status: Abnormal   Collection Time: 10/31/17  7:26 AM  Result Value Ref Range   Glucose-Capillary 117 (H) 65 - 99 mg/dL   Comment 1 Notify RN    Comment 2 Document in Chart     ABGS No results for input(s): PHART, PO2ART, TCO2, HCO3 in the last 72 hours.  Invalid input(s): PCO2 CULTURES Recent Results (from the past 240 hour(s))  Blood Culture (routine x 2)     Status: None   Collection Time: 10/25/17  3:36 PM  Result Value Ref Range Status   Specimen Description BLOOD RIGHT ANTECUBITAL  Final   Special Requests   Final    BOTTLES DRAWN AEROBIC AND ANAEROBIC Blood Culture adequate volume   Culture   Final    NO GROWTH 5 DAYS Performed at [facility], [address], [name], [place] [address]    Report Status 10/30/2017 FINAL  Final  Blood Culture (routine x 2)     Status: None   Collection Time: 10/25/17  3:41 PM  Result Value Ref Range Status   Specimen Description BLOOD BLOOD RIGHT ARM  Final   Special Requests   Final    BOTTLES DRAWN AEROBIC AND ANAEROBIC Blood Culture adequate volume   Culture   Final    NO GROWTH 5 DAYS Performed at [facility], [address], [place], [place] [address]    Report Status 10/30/2017 FINAL  Final  Culture, expectorated sputum-assessment     Status: None   Collection Time: 10/29/17  1:19 PM  Result Value Ref Range Status   Specimen Description EXPECTORATED SPUTUM  Final   Special Requests Normal  Final   Sputum evaluation   Final    THIS SPECIMEN IS ACCEPTABLE FOR SPUTUM CULTURE Performed at [facility], [address], [place], [place] [address]    Report Status 10/30/2017 FINAL  Final  Culture, respiratory (NON-Expectorated)     Status: None (Preliminary result)   Collection Time: 10/29/17  1:19 PM  Result Value Ref Range Status   Specimen Description   Final    EXPECTORATED SPUTUM Performed at [facility], [address] [place], [place] [address]    Special Requests   Final    Normal Reflexed from [id number] Performed at [facility], [address] [place], [place] [address]    Gram Stain   Final    ABUNDANT WBC PRESENT,BOTH PMN AND MONONUCLEAR RARE YEAST Performed at [facility], [place] [address], [place], [place] [address]    Culture PENDING  Incomplete   Report Status PENDING  Incomplete   Studies/Results: No results found.  Medications:  Prior to Admission:  Medications Prior to Admission  Medication Sig Dispense Refill Last Dose  . acetaminophen (TYLENOL) 500 MG tablet Take 1,000 mg by mouth daily as needed for headache.   10/16/2017 at Unknown time  . ALPRAZolam (XANAX) 0.25 MG tablet Take 0.25 mg by mouth 2 (two) times daily.    Past Week at Unknown time  . Apple Cider Vinegar 600 MG CAPS Take 2 capsules by mouth daily.   Past Week at Unknown time  . Artificial Tear Ointment (REFRESH P.M. OP) Apply 1 application to eye daily as needed (dry eye). As directed    Past Week at Unknown time  . aspirin EC 81 MG tablet Take 81 mg by mouth at bedtime.    Past Week at Unknown time  . atorvastatin (LIPITOR) 40 MG tablet Take 1 tablet (40 mg total) by mouth daily. 90 tablet 3  Past Week at Unknown time  . cefdinir (OMNICEF) 300 MG capsule Take 1 capsule (300 mg total) by mouth 2 (two) times daily. 14 capsule 0 Past Week at Unknown time  . Choline Fenofibrate (FENOFIBRIC ACID) 135 MG CPDR Take 1 capsule by mouth daily. 90 capsule 3 Past Week at Unknown time  . clotrimazole-betamethasone (LOTRISONE) cream Apply 1 application topically 2 (two) times daily as needed (psoriasis).   Past Week at Unknown time  . diltiazem (CARDIZEM CD) 240 MG 24 hr capsule Take 1 capsule (240 mg total) by mouth daily. 90 capsule 3 Past Week at Unknown time  . fluticasone (FLONASE) 50 MCG/ACT nasal spray Place 2 sprays into both nostrils daily as needed for allergies.    Past Week at Unknown time  . gabapentin (NEURONTIN) 300 MG capsule Take 1 capsule by mouth at bedtime.  5 Past Week at Unknown time  . HYDROcodone-acetaminophen (NORCO/VICODIN) 5-325 MG per tablet Take 1 tablet by mouth every 8 (eight) hours as needed for moderate pain. (Patient taking differently: Take 1 tablet by mouth daily as needed for moderate pain. ) 90 tablet 0 Past Week at Unknown time  . JARDIANCE 25 MG TABS tablet Take 25 mg by mouth every evening.   3 Past Week at Unknown time  . Multiple Vitamin (MULITIVITAMIN WITH MINERALS) TABS Take 1 tablet by mouth at bedtime.    Past Week at Unknown time  . pantoprazole (PROTONIX) 40 MG tablet Take 1 tablet (40 mg total) by mouth daily. 90 tablet 3 Past Week at Unknown time  . predniSONE (DELTASONE) 10  MG tablet 89fr 1 day,3x1d,2x1d,1x1d 10 tablet 0 Past Week at Unknown time  . PROAIR HFA 108 (90 Base) MCG/ACT inhaler Inhale 2 puffs into the lungs every 4 (four) hours as needed for wheezing or shortness of breath.   12 Past Week at Unknown time  . SitaGLIPtin-MetFORMIN HCl (JANUMET XR) [phone] MG TB24 Take 1 tablet by mouth daily.   Past Week at Unknown time  . tamsulosin (FLOMAX) 0.4 MG CAPS capsule Take 0.4 mg by mouth daily.   Past Week at Unknown time  . tiotropium (SPIRIVA)  18 MCG inhalation capsule Place 18 mcg into inhaler and inhale daily.   Past Week at Unknown time   Scheduled: . albuterol  2.5 mg Nebulization TID  . aspirin EC  81 mg Oral QHS  . atorvastatin  40 mg Oral q1800  . diltiazem  240 mg Oral Daily  . enoxaparin (LOVENOX) injection  40 mg Subcutaneous Q24H  . feeding supplement (ENSURE ENLIVE)  237 mL Oral BID BM  . fenofibrate  160 mg Oral Daily  . gabapentin  300 mg Oral QHS  . guaiFENesin  1,200 mg Oral BID  . insulin aspart  0-15 Units Subcutaneous TID WC  . insulin aspart  0-5 Units Subcutaneous QHS  . insulin aspart  3 Units Subcutaneous TID WC  . mouth rinse  15 mL Mouth Rinse BID  . pantoprazole  40 mg Oral Daily  . PARoxetine  20 mg Oral Daily  . predniSONE  20 mg Oral Q breakfast  . sodium chloride flush  3 mL Intravenous Q12H  . tamsulosin  0.4 mg Oral Daily  . tiotropium  18 mcg Inhalation Daily   Continuous: . sodium chloride    . ceFEPime (MAXIPIME) IV 1 g (10/31/17 0837)  . vancomycin 166.7 mL/hr at 10/31/17 0710   PGEZ:MOQHUTchloride, acetaminophen **OR** acetaminophen, albuterol, ALPRAZolam, fluticasone, HYDROcodone-acetaminophen, ondansetron **OR** ondansetron (ZOFRAN) IV, senna-docusate, sodium chloride flush  Assesment: He was admitted with fever and it was not clear what this is from.  It is presumably from a pulmonary source.  He is improving.  He has significant COPD at baseline.  He was septic on admission but not now.  He remains on cefepime and vancomycin which I will continue for 1 more day.  He has diabetes which is being treated with insulin  He has weakness and has had PT evaluation.  There is concern about mycobacterial disease and culture is pending Principal Problem:   Fever, unknown origin Active Problems:   Muscle weakness (generalized)   Non-alcoholic fatty liver disease   Splenomegaly   GERD (gastroesophageal reflux disease)   Thrombocytopenia ([name])   Dyslipidemia   Type 2 diabetes  mellitus without complication, without long-term current use of insulin ([name])   Dehydration   Goiter   Suspected Mycobacterial infectious disease    Plan: Continue treatments    LOS: 6 days   Cathryne Mancebo L 10/31/2017, 8:40 AM

## 2017-11-01 LAB — ACID FAST SMEAR (AFB): ACID FAST SMEAR - AFSCU2: NEGATIVE

## 2017-11-01 LAB — GLUCOSE, CAPILLARY
GLUCOSE-CAPILLARY: 138 mg/dL — AB (ref 65–99)
GLUCOSE-CAPILLARY: 225 mg/dL — AB (ref 65–99)
Glucose-Capillary: 312 mg/dL — ABNORMAL HIGH (ref 65–99)

## 2017-11-01 LAB — ACID FAST SMEAR (AFB, MYCOBACTERIA)

## 2017-11-01 LAB — CREATININE, SERUM: CREATININE: 0.65 mg/dL (ref 0.61–1.24)

## 2017-11-01 MED ORDER — CEFDINIR 300 MG PO CAPS
300.0000 mg | ORAL_CAPSULE | Freq: Two times a day (BID) | ORAL | Status: DC
  Administered 2017-11-01 – 2017-11-02 (×3): 300 mg via ORAL
  Filled 2017-11-01 (×3): qty 1

## 2017-11-01 NOTE — Progress Notes (Signed)
SATURATION QUALIFICATIONS: (This note is used to comply with regulatory documentation for home oxygen)  Patient Saturations on Room Air at Rest = 90%  Patient Saturations on Room Air while Ambulating = 83%  Patient Saturations on 2 Liters of oxygen while Ambulating = 94%  Please briefly explain why patient needs home oxygen:

## 2017-11-01 NOTE — Progress Notes (Signed)
Subjective: He says he feels better.  He has no new complaints.  Breathing is better.  Objective: Vital signs in last 24 hours: Temp:  [97.9 F (36.6 C)-98.3 F (36.8 C)] 97.9 F (36.6 C) (04/26 0515) Pulse Rate:  [92-111] 92 (04/26 0515) Resp:  [16-20] 16 (04/25 2144) BP: (105-113)/(61-67) 111/62 (04/26 0515) SpO2:  [91 %-93 %] 93 % (04/26 0515) Weight change:  Last BM Date: 10/30/17  Intake/Output from previous day: 04/25 0701 - 04/26 0700 In: 240 [P.O.:240] Out: -   PHYSICAL EXAM General appearance: alert, cooperative and mild distress Resp: His chest is significantly clearer Cardio: regular rate and rhythm, S1, S2 normal, no murmur, click, rub or gallop GI: soft, non-tender; bowel sounds normal; no masses,  no organomegaly Extremities: extremities normal, atraumatic, no cyanosis or edema  Lab Results:  Results for orders placed or performed during the hospital encounter of 10/25/17 (from the past 48 hour(s))  Glucose, capillary     Status: Abnormal   Collection Time: 10/30/17 11:37 AM  Result Value Ref Range   Glucose-Capillary 195 (H) 65 - 99 mg/dL  Glucose, capillary     Status: Abnormal   Collection Time: 10/30/17  4:09 PM  Result Value Ref Range   Glucose-Capillary 310 (H) 65 - 99 mg/dL   Comment 1 Notify RN    Comment 2 Document in Chart   Glucose, capillary     Status: Abnormal   Collection Time: 10/30/17  9:14 PM  Result Value Ref Range   Glucose-Capillary 228 (H) 65 - 99 mg/dL   Comment 1 Notify RN    Comment 2 Document in Chart   Glucose, capillary     Status: Abnormal   Collection Time: 10/31/17  7:26 AM  Result Value Ref Range   Glucose-Capillary 117 (H) 65 - 99 mg/dL   Comment 1 Notify RN    Comment 2 Document in Chart   Glucose, capillary     Status: Abnormal   Collection Time: 10/31/17 11:32 AM  Result Value Ref Range   Glucose-Capillary 296 (H) 65 - 99 mg/dL   Comment 1 Notify RN    Comment 2 Document in Chart   Glucose, capillary      Status: Abnormal   Collection Time: 10/31/17  4:50 PM  Result Value Ref Range   Glucose-Capillary 263 (H) 65 - 99 mg/dL   Comment 1 Notify RN    Comment 2 Document in Chart   Glucose, capillary     Status: Abnormal   Collection Time: 10/31/17  9:47 PM  Result Value Ref Range   Glucose-Capillary 192 (H) 65 - 99 mg/dL  Creatinine, serum     Status: None   Collection Time: 11/01/17  5:56 AM  Result Value Ref Range   Creatinine, Ser 0.65 0.61 - 1.24 mg/dL   GFR calc non Af Amer >60 >60 mL/min   GFR calc Af Amer >60 >60 mL/min    Comment: (NOTE) The eGFR has been calculated using the CKD EPI equation. This calculation has not been validated in all clinical situations. eGFR's persistently <60 mL/min signify possible Chronic Kidney Disease. Performed at [facility], [address], [place], [place] [address]   Glucose, capillary     Status: Abnormal   Collection Time: 11/01/17  8:04 AM  Result Value Ref Range   Glucose-Capillary 138 (H) 65 - 99 mg/dL    ABGS No results for input(s): PHART, PO2ART, TCO2, HCO3 in the last 72 hours.  Invalid input(s): PCO2 CULTURES Recent  Results (from the past 240 hour(s))  Blood Culture (routine x 2)     Status: None   Collection Time: 10/25/17  3:36 PM  Result Value Ref Range Status   Specimen Description BLOOD RIGHT ANTECUBITAL  Final   Special Requests   Final    BOTTLES DRAWN AEROBIC AND ANAEROBIC Blood Culture adequate volume   Culture   Final    NO GROWTH 5 DAYS Performed at [facility], [address], [place], [place] [address]    Report Status 10/30/2017 FINAL  Final  Blood Culture (routine x 2)     Status: None   Collection Time: 10/25/17  3:41 PM  Result Value Ref Range Status   Specimen Description BLOOD BLOOD RIGHT ARM  Final   Special Requests   Final    BOTTLES DRAWN AEROBIC AND ANAEROBIC Blood Culture adequate volume   Culture   Final    NO GROWTH 5 DAYS Performed at [facility], [address] [place], [place]  [address]    Report Status 10/30/2017 FINAL  Final  Acid Fast Smear (AFB)     Status: None   Collection Time: 10/29/17  1:19 PM  Result Value Ref Range Status   AFB Specimen Processing Concentration  Final   Acid Fast Smear Negative  Final    Comment: (NOTE) Performed At: [facility] [address] [place], [place] [id number] Rush Farmer MD [id number]    Source (AFB) EXPECTORATED SPUTUM  Final    Comment: Performed at [facility], [address] [place], [place] [address]  Culture, expectorated sputum-assessment     Status: None   Collection Time: 10/29/17  1:19 PM  Result Value Ref Range Status   Specimen Description EXPECTORATED SPUTUM  Final   Special Requests Normal  Final   Sputum evaluation   Final    THIS SPECIMEN IS ACCEPTABLE FOR SPUTUM CULTURE Performed at [facility], [address], [place], [place] [address]    Report Status 10/30/2017 FINAL  Final  Culture, respiratory (NON-Expectorated)     Status: None (Preliminary result)   Collection Time: 10/29/17  1:19 PM  Result Value Ref Range Status   Specimen Description   Final    EXPECTORATED SPUTUM Performed at [facility], [address], [place], [place] [address]    Special Requests   Final    Normal Reflexed from [id number] Performed at [facility], [address] [place], [place] [address]    Gram Stain   Final    ABUNDANT WBC PRESENT,BOTH PMN AND MONONUCLEAR RARE YEAST    Culture   Final    CULTURE REINCUBATED FOR BETTER GROWTH Performed at [facility], [place] [address], [place], [place] [address]    Report Status PENDING  Incomplete   Studies/Results: No results found.  Medications:  Prior to Admission:  Medications Prior to Admission  Medication Sig Dispense Refill Last Dose  . acetaminophen (TYLENOL) 500 MG tablet Take 1,000 mg by mouth daily as needed for headache.   10/16/2017 at Unknown time  . ALPRAZolam (XANAX) 0.25 MG tablet Take 0.25 mg by mouth 2 (two) times daily.    Past  Week at Unknown time  . Apple Cider Vinegar 600 MG CAPS Take 2 capsules by mouth daily.   Past Week at Unknown time  . Artificial Tear Ointment (REFRESH P.M. OP) Apply 1 application to eye daily as needed (dry eye). As directed    Past Week at Unknown time  . aspirin EC 81 MG  tablet Take 81 mg by mouth at bedtime.    Past Week at Unknown time  . atorvastatin (LIPITOR) 40 MG tablet Take 1 tablet (40 mg total) by mouth daily. 90 tablet 3 Past Week at Unknown time  . cefdinir (OMNICEF) 300 MG capsule Take 1 capsule (300 mg total) by mouth 2 (two) times daily. 14 capsule 0 Past Week at Unknown time  . Choline Fenofibrate (FENOFIBRIC ACID) 135 MG CPDR Take 1 capsule by mouth daily. 90 capsule 3 Past Week at Unknown time  . clotrimazole-betamethasone (LOTRISONE) cream Apply 1 application topically 2 (two) times daily as needed (psoriasis).   Past Week at Unknown time  . diltiazem (CARDIZEM CD) 240 MG 24 hr capsule Take 1 capsule (240 mg total) by mouth daily. 90 capsule 3 Past Week at Unknown time  . fluticasone (FLONASE) 50 MCG/ACT nasal spray Place 2 sprays into both nostrils daily as needed for allergies.    Past Week at Unknown time  . gabapentin (NEURONTIN) 300 MG capsule Take 1 capsule by mouth at bedtime.  5 Past Week at Unknown time  . HYDROcodone-acetaminophen (NORCO/VICODIN) 5-325 MG per tablet Take 1 tablet by mouth every 8 (eight) hours as needed for moderate pain. (Patient taking differently: Take 1 tablet by mouth daily as needed for moderate pain. ) 90 tablet 0 Past Week at Unknown time  . JARDIANCE 25 MG TABS tablet Take 25 mg by mouth every evening.   3 Past Week at Unknown time  . Multiple Vitamin (MULITIVITAMIN WITH MINERALS) TABS Take 1 tablet by mouth at bedtime.    Past Week at Unknown time  . pantoprazole (PROTONIX) 40 MG tablet Take 1 tablet (40 mg total) by mouth daily. 90 tablet 3 Past Week at Unknown time  . predniSONE (DELTASONE) 10 MG tablet 33fr 1 day,3x1d,2x1d,1x1d 10 tablet 0  Past Week at Unknown time  . PROAIR HFA 108 (90 Base) MCG/ACT inhaler Inhale 2 puffs into the lungs every 4 (four) hours as needed for wheezing or shortness of breath.   12 Past Week at Unknown time  . SitaGLIPtin-MetFORMIN HCl (JANUMET XR) [phone] MG TB24 Take 1 tablet by mouth daily.   Past Week at Unknown time  . tamsulosin (FLOMAX) 0.4 MG CAPS capsule Take 0.4 mg by mouth daily.   Past Week at Unknown time  . tiotropium (SPIRIVA) 18 MCG inhalation capsule Place 18 mcg into inhaler and inhale daily.   Past Week at Unknown time   Scheduled: . albuterol  2.5 mg Nebulization TID  . aspirin EC  81 mg Oral QHS  . atorvastatin  40 mg Oral q1800  . diltiazem  240 mg Oral Daily  . enoxaparin (LOVENOX) injection  40 mg Subcutaneous Q24H  . feeding supplement (ENSURE ENLIVE)  237 mL Oral BID BM  . fenofibrate  160 mg Oral Daily  . gabapentin  300 mg Oral QHS  . guaiFENesin  1,200 mg Oral BID  . insulin aspart  0-15 Units Subcutaneous TID WC  . insulin aspart  0-5 Units Subcutaneous QHS  . insulin aspart  3 Units Subcutaneous TID WC  . mouth rinse  15 mL Mouth Rinse BID  . pantoprazole  40 mg Oral Daily  . PARoxetine  20 mg Oral Daily  . predniSONE  20 mg Oral Q breakfast  . sodium chloride flush  3 mL Intravenous Q12H  . tamsulosin  0.4 mg Oral Daily  . tiotropium  18 mcg Inhalation Daily   Continuous: . sodium chloride    .  ceFEPime (MAXIPIME) IV Stopped (11/01/17 0645)  . vancomycin Stopped (11/01/17 0630)   VIF:BPPHKF chloride, acetaminophen **OR** acetaminophen, albuterol, ALPRAZolam, fluticasone, HYDROcodone-acetaminophen, ondansetron **OR** ondansetron (ZOFRAN) IV, senna-docusate, sodium chloride flush  Assesment: He came in with fever.  He is been having trouble for about 2 months or more with cough and congestion with intermittent fevers.  He is felt to have a pulmonary source of his fever.  We were able to obtain sputum and it shows multiple white blood cells occasional yeast and  it has been reintubated.  He is off vancomycin and now just on cefepime.  He is doing okay with that.  He may have mycobacterial disease and culture has been obtained but will not be ready for at least 2 weeks.  He was dehydrated on admission which is stable now.  He has diabetes which is not totally controlled Principal Problem:   Fever, unknown origin Active Problems:   Muscle weakness (generalized)   Non-alcoholic fatty liver disease   Splenomegaly   GERD (gastroesophageal reflux disease)   Thrombocytopenia ([name])   Dyslipidemia   Type 2 diabetes mellitus without complication, without long-term current use of insulin ([name])   Dehydration   Goiter   Suspected Mycobacterial infectious disease    Plan: Discontinue cefepime start [place] watch him overnight and assuming he does not develop more fever or discharge in the morning    LOS: 7 days   Ciarrah Rae L 11/01/2017, 8:30 AM

## 2017-11-01 NOTE — Care Management Important Message (Signed)
Important Message  Patient Details  Name: [name] MRN: [id number] Date of Birth: 1956-05-28   Medicare Important Message Given:  Yes    Sherald Barge, RN 11/01/2017, 3:39 PM

## 2017-11-01 NOTE — Progress Notes (Signed)
Inpatient Diabetes Program Recommendations  AACE/ADA: New Consensus Statement on Inpatient Glycemic Control (2015)  Target Ranges:  Prepandial:   less than 140 mg/dL      Peak postprandial:   less than 180 mg/dL (1-2 hours)      Critically ill patients:  140 - 180 mg/dL  Results for [name], [name] (MRN [id number]) as of 11/01/2017 09:21  Ref. Range 10/31/2017 07:26 10/31/2017 11:32 10/31/2017 16:50 10/31/2017 21:47 11/01/2017 08:04  Glucose-Capillary Latest Ref Range: 65 - 99 mg/dL 117 (H) 296 (H)  Novolog 11 units 263 (H)  Novolog 11 units 192 (H)  Novolog 11 units 138 (H)  Novolog 5 units   Results for [name], [name] (MRN [id number]) as of 10/31/2017 08:43  Ref. Range 10/30/2017 07:48 10/30/2017 11:37 10/30/2017 16:09 10/30/2017 21:14 10/31/2017 07:26  Glucose-Capillary Latest Ref Range: 65 - 99 mg/dL 121 (H) 195 (H) 310 (H) 228 (H) 117 (H)   Review of Glycemic Control  Diabetes history: DM2 Outpatient Diabetes medications: Jardiance 25 mg QPM, Janumet XR [phone] mg TID Current orders for Inpatient glycemic control: Novolog 0-15 units TID with meals, Novolog 0-5 units QHS, Novolog 3 units TID with meals for meal coverage; Prednisone 20 mg QAM  Inpatient Diabetes Program Recommendations: Insulin - Meal Coverage: Noted post prandial glucose is consistently elevated despite Novolog 3 units TID for meal coverage. If steroids are continued, please consider increasing meal coverage to Novolog 6 units TID with meals.  Thanks, Barnie Alderman, RN, MSN, CDE Diabetes Coordinator Inpatient Diabetes Program [phone] (Team Pager from 8am to 5pm)

## 2017-11-01 NOTE — Care Management Note (Signed)
Case Management Note  Patient Details  Name: [name] MRN: [id number] Date of Birth: [birth year]  Subjective/Objective:     Admitted with fever of unknown origin. Pt from home, ind pta. Pt has PCP, transportation and insurance with drug coverage.                Action/Plan: DC home over weekend. Pt needs home O2 and neb machine, has used [facility] in the past, would like to use them again. Juliann Pulse, [facility] rep, aware of referral and will pull pt info from chart and deliver DME to pt room prior to DC.  Expected Discharge Date:  10/28/17               Expected Discharge Plan:  Home/Self Care  In-House Referral:  NA  Discharge planning Services  CM Consult  Post Acute Care Choice:  Durable Medical Equipment Choice offered to:  Patient  DME Arranged:  Oxygen, Nebulizer machine DME Agency:  [place].  Status of Service:  Completed, signed off  Sherald Barge, RN 11/01/2017, 3:40 PM

## 2017-11-02 LAB — GLUCOSE, CAPILLARY: Glucose-Capillary: 137 mg/dL — ABNORMAL HIGH (ref 65–99)

## 2017-11-02 MED ORDER — PREDNISONE 20 MG PO TABS: 20.0000 mg | ORAL_TABLET | Freq: Every day | ORAL | 0 refills | Status: DC

## 2017-11-02 MED ORDER — ALBUTEROL SULFATE (2.5 MG/3ML) 0.083% IN NEBU: 2.5000 mg | INHALATION_SOLUTION | Freq: Three times a day (TID) | RESPIRATORY_TRACT | 12 refills | Status: DC

## 2017-11-02 MED ORDER — CEFDINIR 300 MG PO CAPS: 300.0000 mg | ORAL_CAPSULE | Freq: Two times a day (BID) | ORAL | 1 refills | Status: DC

## 2017-11-02 MED ORDER — ALBUTEROL SULFATE (2.5 MG/3ML) 0.083% IN NEBU: 2.5000 mg | INHALATION_SOLUTION | RESPIRATORY_TRACT | 12 refills | Status: DC | PRN

## 2017-11-02 NOTE — Discharge Summary (Signed)
Physician Discharge Summary  Patient ID: [name] MRN: [id number] DOB/AGE: [birth year] 62 y.o. Primary Care Physician:Alejandro Adcox, Percell Miller, MD Admit date: 10/25/2017 Discharge date: 11/02/2017    Discharge Diagnoses:   Principal Problem:   Fever, unknown origin Active Problems:   Muscle weakness (generalized)   Non-alcoholic fatty liver disease   Splenomegaly   GERD (gastroesophageal reflux disease)   Thrombocytopenia ([name])   Dyslipidemia   Type 2 diabetes mellitus without complication, without long-term current use of insulin ([name])   Dehydration   Goiter   Suspected Mycobacterial infectious disease   Allergies as of 11/02/2017      Reactions   Codeine Rash   Baclofen    lethargic    Simvastatin Nausea Only      Medication List    TAKE these medications   acetaminophen 500 MG tablet Commonly known as:  TYLENOL Take 1,000 mg by mouth daily as needed for headache.   ALPRAZolam 0.25 MG tablet Commonly known as:  XANAX Take 0.25 mg by mouth 2 (two) times daily.   Apple Cider Vinegar 600 MG Caps Take 2 capsules by mouth daily.   aspirin EC 81 MG tablet Take 81 mg by mouth at bedtime.   atorvastatin 40 MG tablet Commonly known as:  LIPITOR Take 1 tablet (40 mg total) by mouth daily.   cefdinir 300 MG capsule Commonly known as:  OMNICEF Take 1 capsule (300 mg total) by mouth every 12 (twelve) hours. What changed:  when to take this   clotrimazole-betamethasone cream Commonly known as:  LOTRISONE Apply 1 application topically 2 (two) times daily as needed (psoriasis).   diltiazem 240 MG 24 hr capsule Commonly known as:  CARDIZEM CD Take 1 capsule (240 mg total) by mouth daily.   Fenofibric Acid 135 MG Cpdr Take 1 capsule by mouth daily.   fluticasone 50 MCG/ACT nasal spray Commonly known as:  FLONASE Place 2 sprays into both nostrils daily as needed for allergies.   gabapentin 300 MG capsule Commonly known as:  NEURONTIN Take 1 capsule by mouth at  bedtime.   HYDROcodone-acetaminophen 5-325 MG tablet Commonly known as:  NORCO/VICODIN Take 1 tablet by mouth every 8 (eight) hours as needed for moderate pain. What changed:  when to take this   JANUMET XR [phone] MG Tb24 Generic drug:  SitaGLIPtin-MetFORMIN HCl Take 1 tablet by mouth daily.   JARDIANCE 25 MG Tabs tablet Generic drug:  empagliflozin Take 25 mg by mouth every evening.   multivitamin with minerals Tabs tablet Take 1 tablet by mouth at bedtime.   pantoprazole 40 MG tablet Commonly known as:  PROTONIX Take 1 tablet (40 mg total) by mouth daily.   predniSONE 20 MG tablet Commonly known as:  DELTASONE Take 1 tablet (20 mg total) by mouth daily with breakfast. Start taking on:  11/03/2017 What changed:    medication strength  how much to take  how to take this  when to take this  additional instructions   PROAIR HFA 108 (90 Base) MCG/ACT inhaler Generic drug:  albuterol Inhale 2 puffs into the lungs every 4 (four) hours as needed for wheezing or shortness of breath. What changed:  Another medication with the same name was added. Make sure you understand how and when to take each.   albuterol (2.5 MG/3ML) 0.083% nebulizer solution Commonly known as:  PROVENTIL Take 3 mLs (2.5 mg total) by nebulization 3 (three) times daily. What changed:  You were already taking a medication with the same name, and  this prescription was added. Make sure you understand how and when to take each.   albuterol (2.5 MG/3ML) 0.083% nebulizer solution Commonly known as:  PROVENTIL Take 3 mLs (2.5 mg total) by nebulization every 2 (two) hours as needed for wheezing. What changed:  You were already taking a medication with the same name, and this prescription was added. Make sure you understand how and when to take each.   REFRESH P.M. OP Apply 1 application to eye daily as needed (dry eye). As directed   tamsulosin 0.4 MG Caps capsule Commonly known as:  FLOMAX Take 0.4 mg  by mouth daily.   tiotropium 18 MCG inhalation capsule Commonly known as:  SPIRIVA Place 18 mcg into inhaler and inhale daily.            Durable Medical Equipment  (From admission, onward)        Start     Ordered   11/01/17 1504  For home use only DME Nebulizer machine  Once    Question:  Patient needs a nebulizer to treat with the following condition  Answer:  COPD (chronic obstructive pulmonary disease) ([place])   11/01/17 1503   11/01/17 1504  For home use only DME oxygen  Once    Comments:  Portable oxygen concentrator  Question Answer Comment  Mode or (Route) Nasal cannula   Liters per Minute 2   Frequency Continuous (stationary and portable oxygen unit needed)   Oxygen conserving device Yes   Oxygen delivery system Gas      11/01/17 1503   11/01/17 1502  For home use only DME Nebulizer machine  Once    Question:  Patient needs a nebulizer to treat with the following condition  Answer:  COPD (chronic obstructive pulmonary disease) ([place])   11/01/17 1502      Discharged Condition: Improved    Consults: None  Significant Diagnostic Studies: Dg Chest 2 View  Result Date: 10/25/2017 CLINICAL DATA:  Cough and fever EXAM: CHEST - 2 VIEW COMPARISON:  October 17, 2017 chest radiograph and chest CT August 09, 2017 FINDINGS: Lungs are hyperexpanded. There are calcified granulomas in the right upper lobe. There is no edema or consolidation. Heart size and pulmonary vascularity are normal. No adenopathy. No evident bone lesions. IMPRESSION: Hyperexpansion without edema or consolidation. Calcified granulomas in right upper lobe. Stable cardiac silhouette. Comment: The nodular opacity seen in the left base on recent CT is not appreciable by radiography. Recommendations made for follow-up CT based on the prior chest CT remain in effect. Electronically Signed   By: Lowella Grip III M.D.   On: 10/25/2017 16:15   X-ray Chest Pa And Lateral  Result Date: 10/17/2017 CLINICAL DATA:   Fever or cough, asthma, COPD, diabetes mellitus, hypertension, former smoker, GERD EXAM: CHEST - 2 VIEW COMPARISON:  10/07/2017 FINDINGS: Normal heart size, mediastinal contours, and pulmonary vascularity. Calcified granulomata LEFT upper lobe. Chronic accentuation of inferior RIGHT hilum unchanged. Emphysematous and bronchitic changes with chronic accentuation of interstitial markings in the mid RIGHT lung, little changed when accounting for differences in technique. No definite acute infiltrate, pleural effusion or pneumothorax. Bones demineralized with persistent compression deformity of a vertebra at the thoracolumbar junction. IMPRESSION: Changes of COPD and old granulomatous disease. Chronic inferior RIGHT hilar prominence. No acute abnormalities. Electronically Signed   By: Lavonia Dana M.D.   On: 10/17/2017 13:39   Dg Chest 2 View  Result Date: 10/07/2017 CLINICAL DATA:  62 year old male with cough congestion shortness of breath  and fever intermittently for 1 month. Currently on antibiotic for lung infection. EXAM: CHEST - 2 VIEW COMPARISON:  Low-dose screening chest CT 08/09/2017 and earlier. FINDINGS: Calcified right upper lobe granulomas redemonstrated. Mildly larger lung volumes than on 2015 radiographs. Mediastinal contours remain normal. No pneumothorax, pleural effusion or consolidation. However, there are asymmetric increased reticular interstitial opacities in both lungs since 2015, greater on the right and in the anterior right upper lobe. An anterior wedge compression fracture at the thoracolumbar junction was partially visible in February and appears chronic. Negative visible bowel gas pattern. IMPRESSION: 1. Increased asymmetric pulmonary interstitial opacity since 2015 suspicious for viral/atypical respiratory infection as suspected on the February CT. This is maximal in the right upper lobe. No pleural effusion or consolidation. 2. Otherwise chronic lung disease, and calcified right upper  lobe granulomas. Electronically Signed   By: Genevie Ann M.D.   On: 10/07/2017 12:08   Ct Angio Chest Pe W And/or Wo Contrast  Result Date: 10/25/2017 CLINICAL DATA:  Shortness of breath, chest pressure, and fever. EXAM: CT ANGIOGRAPHY CHEST WITH CONTRAST TECHNIQUE: Multidetector CT imaging of the chest was performed using the standard protocol during bolus administration of intravenous contrast. Multiplanar CT image reconstructions and MIPs were obtained to evaluate the vascular anatomy. CONTRAST:  180m ISOVUE-370 IOPAMIDOL (ISOVUE-370) INJECTION 76% COMPARISON:  Lung cancer screening low-dose chest CT 08/09/2017. FINDINGS: Cardiovascular: Pulmonary arterial opacification is adequate without evidence of emboli. The main pulmonary artery is dilated to 3.1 cm diameter, unchanged and which may reflect underlying pulmonary arterial hypertension. Mild thoracic aortic atherosclerosis is noted without aneurysm. There is LAD coronary artery atherosclerosis. Heart size is normal. No pericardial effusion. Mediastinum/Nodes: No enlarged axillary, mediastinal, or hilar lymph nodes. Unremarkable esophagus and thyroid. Lungs/Pleura: No pleural effusion or pneumothorax. Centrilobular and paraseptal emphysema. Calcified granulomas in both lungs, the largest measuring 1.4 cm in the right upper lobe. Evaluation of the lung parenchyma is mildly limited by respiratory motion artifact with scattered areas of mild tree-in-bud nodularity and mild bronchiectasis better shown on the prior study. There is mild atelectasis in both lung bases. The 25 x 10 mm nodular subpleural opacity in the basilar left lower lobe on the prior study has resolved. Upper Abdomen: Suspected hepatic steatosis. 9 mm low-density lesion laterally in the spleen, unchanged from an abdominal CT from 05/28/2013 and likely benign. Musculoskeletal: Chronic L1 compression fracture with severe height loss centrally. Review of the MIP images confirms the above findings.  IMPRESSION: 1. No evidence of pulmonary emboli. 2. Interval resolution of left lower lobe nodular opacity. 3. Aortic Atherosclerosis (ICD10-I70.0) and Emphysema (ICD10-J43.9). Electronically Signed   By: ALogan BoresM.D.   On: 10/25/2017 18:40   [place]yroid  Result Date: 10/26/2017 CLINICAL DATA:  Goiter, thyromegaly EXAM: THYROID ULTRASOUND TECHNIQUE: Ultrasound examination of the thyroid gland and adjacent soft tissues was performed. COMPARISON:  None. FINDINGS: Parenchymal Echotexture: Mildly heterogenous Isthmus: 0.3 cm thickness Right lobe: 4 x 1.1 x 0.9 cm Left lobe: 4 x 1.4 x 1.4 cm _________________________________________________________ Estimated total number of nodules >/= 1 cm: 0 Number of spongiform nodules >/=  2 cm not described below (TR1): 0 Number of mixed cystic and solid nodules >/= 1.5 cm not described below (TR2): 0 _________________________________________________________ No discrete nodules are seen within the thyroid gland. IMPRESSION: 1. Normal-sized heterogeneous thyroid.  No nodule. The above is in keeping with the ACR TI-RADS recommendations - J Am Coll Radiol 2017;14:587-595. Electronically Signed   By: DEden EmmsD.  On: 10/26/2017 10:33    Lab Results: Basic Metabolic Panel: Recent Labs    11/01/17 0556  CREATININE 0.65   Liver Function Tests: No results for input(s): AST, ALT, ALKPHOS, BILITOT, PROT, ALBUMIN in the last 72 hours.   CBC: No results for input(s): WBC, NEUTROABS, HGB, HCT, MCV, PLT in the last 72 hours.  Recent Results (from the past 240 hour(s))  Blood Culture (routine x 2)     Status: None   Collection Time: 10/25/17  3:36 PM  Result Value Ref Range Status   Specimen Description BLOOD RIGHT ANTECUBITAL  Final   Special Requests   Final    BOTTLES DRAWN AEROBIC AND ANAEROBIC Blood Culture adequate volume   Culture   Final    NO GROWTH 5 DAYS Performed at [facility], [address], [place], [place] [address]    Report Status  10/30/2017 FINAL  Final  Blood Culture (routine x 2)     Status: None   Collection Time: 10/25/17  3:41 PM  Result Value Ref Range Status   Specimen Description BLOOD BLOOD RIGHT ARM  Final   Special Requests   Final    BOTTLES DRAWN AEROBIC AND ANAEROBIC Blood Culture adequate volume   Culture   Final    NO GROWTH 5 DAYS Performed at [facility], [address] [place], [place] [address]    Report Status 10/30/2017 FINAL  Final  Acid Fast Smear (AFB)     Status: None   Collection Time: 10/29/17  1:19 PM  Result Value Ref Range Status   AFB Specimen Processing Concentration  Final   Acid Fast Smear Negative  Final    Comment: (NOTE) Performed At: [facility] [address] [place], [place] [id number] Rush Farmer MD ZH:[id number]    Source (AFB) EXPECTORATED SPUTUM  Final    Comment: Performed at [facility], [address], [place], [place] [address]  Culture, expectorated sputum-assessment     Status: None   Collection Time: 10/29/17  1:19 PM  Result Value Ref Range Status   Specimen Description EXPECTORATED SPUTUM  Final   Special Requests Normal  Final   Sputum evaluation   Final    THIS SPECIMEN IS ACCEPTABLE FOR SPUTUM CULTURE Performed at [facility], [address], [place], [place] [address]    Report Status 10/30/2017 FINAL  Final  Culture, respiratory (NON-Expectorated)     Status: None (Preliminary result)   Collection Time: 10/29/17  1:19 PM  Result Value Ref Range Status   Specimen Description   Final    EXPECTORATED SPUTUM Performed at [facility], [address], [place], [place] [address]    Special Requests   Final    Normal Reflexed from [id number] Performed at [facility], [address], [place], [place] [address]    Gram Stain   Final    ABUNDANT WBC PRESENT,BOTH PMN AND MONONUCLEAR RARE YEAST    Culture   Final    CULTURE REINCUBATED FOR BETTER GROWTH Performed at [facility], [place] [address], [place], [place] [address]     Report Status PENDING  Incomplete     Hospital Course: This is a 62 year old who came to the hospital because of fever.  He had been in the hospital earlier with fever thought to be related to a pulmonary infection.  He is been having trouble for about 2 months.  He was started on intravenous antibiotics.  He was given oral prednisone.  He improved over the next  several days.  He had no further fever.  There is concerned that he may have Mycobacterium avium disease but his culture is still pending.  His sputum culture is also still pending.  He was found to be hypoxic and is going to go home on home oxygen.  He has pretty severe COPD at baseline.  He will also go home with a nebulizer.  Discharge Exam: Blood pressure 112/73, pulse 93, temperature 97.9 F (36.6 C), temperature source Oral, resp. rate 18, height 5' 7"  (1.702 m), weight 68.2 kg (150 lb 5.7 oz), SpO2 93 %. He is awake and alert.  His chest is clear.  His heart is regular  Disposition: Home      Signed: Candis Kabel L   11/02/2017, 10:24 AM

## 2017-11-02 NOTE — Progress Notes (Signed)
Subjective: He says he feels well and wants to go home.  He does qualify for home oxygen.  He has no other new complaints.  Objective: Vital signs in last 24 hours: Temp:  [97.7 F (36.5 C)-98.5 F (36.9 C)] 97.9 F (36.6 C) (04/27 0447) Pulse Rate:  [71-105] 93 (04/27 0447) Resp:  [18-21] 18 (04/27 0447) BP: (111-114)/(69-82) 112/73 (04/27 0447) SpO2:  [91 %-95 %] 93 % (04/27 0746) Weight change:  Last BM Date: 10/30/17  Intake/Output from previous day: 04/26 0701 - 04/27 0700 In: 1080 [P.O.:1080] Out: 2200 [Urine:2200]  PHYSICAL EXAM General appearance: alert, cooperative and no distress Resp: clear to auscultation bilaterally Cardio: regular rate and rhythm, S1, S2 normal, no murmur, click, rub or gallop GI: soft, non-tender; bowel sounds normal; no masses,  no organomegaly Extremities: extremities normal, atraumatic, no cyanosis or edema  Lab Results:  Results for orders placed or performed during the hospital encounter of 10/25/17 (from the past 48 hour(s))  Glucose, capillary     Status: Abnormal   Collection Time: 10/31/17 11:32 AM  Result Value Ref Range   Glucose-Capillary 296 (H) 65 - 99 mg/dL   Comment 1 Notify RN    Comment 2 Document in Chart   Glucose, capillary     Status: Abnormal   Collection Time: 10/31/17  4:50 PM  Result Value Ref Range   Glucose-Capillary 263 (H) 65 - 99 mg/dL   Comment 1 Notify RN    Comment 2 Document in Chart   Glucose, capillary     Status: Abnormal   Collection Time: 10/31/17  9:47 PM  Result Value Ref Range   Glucose-Capillary 192 (H) 65 - 99 mg/dL  Creatinine, serum     Status: None   Collection Time: 11/01/17  5:56 AM  Result Value Ref Range   Creatinine, Ser 0.65 0.61 - 1.24 mg/dL   GFR calc non Af Amer >60 >60 mL/min   GFR calc Af Amer >60 >60 mL/min    Comment: (NOTE) The eGFR has been calculated using the CKD EPI equation. This calculation has not been validated in all clinical situations. eGFR's persistently  <60 mL/min signify possible Chronic Kidney Disease. Performed at [facility], [address] [place], [place] [address]   Glucose, capillary     Status: Abnormal   Collection Time: 11/01/17  8:04 AM  Result Value Ref Range   Glucose-Capillary 138 (H) 65 - 99 mg/dL  Glucose, capillary     Status: Abnormal   Collection Time: 11/01/17 11:51 AM  Result Value Ref Range   Glucose-Capillary 225 (H) 65 - 99 mg/dL  Glucose, capillary     Status: Abnormal   Collection Time: 11/01/17  9:09 PM  Result Value Ref Range   Glucose-Capillary 312 (H) 65 - 99 mg/dL  Glucose, capillary     Status: Abnormal   Collection Time: 11/02/17  7:51 AM  Result Value Ref Range   Glucose-Capillary 137 (H) 65 - 99 mg/dL    ABGS No results for input(s): PHART, PO2ART, TCO2, HCO3 in the last 72 hours.  Invalid input(s): PCO2 CULTURES Recent Results (from the past 240 hour(s))  Blood Culture (routine x 2)     Status: None   Collection Time: 10/25/17  3:36 PM  Result Value Ref Range Status   Specimen Description BLOOD RIGHT ANTECUBITAL  Final   Special Requests   Final    BOTTLES DRAWN AEROBIC AND ANAEROBIC Blood Culture adequate volume   Culture   Final  NO GROWTH 5 DAYS Performed at [facility], [address] [place], [place] [address]    Report Status 10/30/2017 FINAL  Final  Blood Culture (routine x 2)     Status: None   Collection Time: 10/25/17  3:41 PM  Result Value Ref Range Status   Specimen Description BLOOD BLOOD RIGHT ARM  Final   Special Requests   Final    BOTTLES DRAWN AEROBIC AND ANAEROBIC Blood Culture adequate volume   Culture   Final    NO GROWTH 5 DAYS Performed at [facility], [address] [place], [place] [address]    Report Status 10/30/2017 FINAL  Final  Acid Fast Smear (AFB)     Status: None   Collection Time: 10/29/17  1:19 PM  Result Value Ref Range Status   AFB Specimen Processing Concentration  Final   Acid Fast Smear Negative  Final    Comment:  (NOTE) Performed At: [facility] [place], [place] [id number] Rush Farmer MD QQ:[id number]    Source (AFB) EXPECTORATED SPUTUM  Final    Comment: Performed at [facility], [address], [place], [place] [address]  Culture, expectorated sputum-assessment     Status: None   Collection Time: 10/29/17  1:19 PM  Result Value Ref Range Status   Specimen Description EXPECTORATED SPUTUM  Final   Special Requests Normal  Final   Sputum evaluation   Final    THIS SPECIMEN IS ACCEPTABLE FOR SPUTUM CULTURE Performed at [facility], [address] [place], [place] [address]    Report Status 10/30/2017 FINAL  Final  Culture, respiratory (NON-Expectorated)     Status: None (Preliminary result)   Collection Time: 10/29/17  1:19 PM  Result Value Ref Range Status   Specimen Description   Final    EXPECTORATED SPUTUM Performed at [facility], [address], [place], [place] [address]    Special Requests   Final    Normal Reflexed from [phone] Performed at [facility], [address], [place], [place] [address]    Gram Stain   Final    ABUNDANT WBC PRESENT,BOTH PMN AND MONONUCLEAR RARE YEAST    Culture   Final    CULTURE REINCUBATED FOR BETTER GROWTH Performed at [facility], [place] [address] [place], [place] [address]    Report Status PENDING  Incomplete   Studies/Results: No results found.  Medications:  Prior to Admission:  Medications Prior to Admission  Medication Sig Dispense Refill Last Dose  . acetaminophen (TYLENOL) 500 MG tablet Take 1,000 mg by mouth daily as needed for headache.   10/16/2017 at Unknown time  . ALPRAZolam (XANAX) 0.25 MG tablet Take 0.25 mg by mouth 2 (two) times daily.    Past Week at Unknown time  . Apple Cider Vinegar 600 MG CAPS Take 2 capsules by mouth daily.   Past Week at Unknown time  . Artificial Tear Ointment (REFRESH P.M. OP) Apply 1 application to eye daily as needed (dry eye). As directed    Past Week at Unknown  time  . aspirin EC 81 MG tablet Take 81 mg by mouth at bedtime.    Past Week at Unknown time  . atorvastatin (LIPITOR) 40 MG tablet Take 1 tablet (40 mg total) by mouth daily. 90 tablet 3 Past Week at Unknown time  . cefdinir (OMNICEF) 300 MG capsule Take 1 capsule (300 mg total) by mouth 2 (two) times daily. 14 capsule 0 Past Week at Unknown time  .  Choline Fenofibrate (FENOFIBRIC ACID) 135 MG CPDR Take 1 capsule by mouth daily. 90 capsule 3 Past Week at Unknown time  . clotrimazole-betamethasone (LOTRISONE) cream Apply 1 application topically 2 (two) times daily as needed (psoriasis).   Past Week at Unknown time  . diltiazem (CARDIZEM CD) 240 MG 24 hr capsule Take 1 capsule (240 mg total) by mouth daily. 90 capsule 3 Past Week at Unknown time  . fluticasone (FLONASE) 50 MCG/ACT nasal spray Place 2 sprays into both nostrils daily as needed for allergies.    Past Week at Unknown time  . gabapentin (NEURONTIN) 300 MG capsule Take 1 capsule by mouth at bedtime.  5 Past Week at Unknown time  . HYDROcodone-acetaminophen (NORCO/VICODIN) 5-325 MG per tablet Take 1 tablet by mouth every 8 (eight) hours as needed for moderate pain. (Patient taking differently: Take 1 tablet by mouth daily as needed for moderate pain. ) 90 tablet 0 Past Week at Unknown time  . JARDIANCE 25 MG TABS tablet Take 25 mg by mouth every evening.   3 Past Week at Unknown time  . Multiple Vitamin (MULITIVITAMIN WITH MINERALS) TABS Take 1 tablet by mouth at bedtime.    Past Week at Unknown time  . pantoprazole (PROTONIX) 40 MG tablet Take 1 tablet (40 mg total) by mouth daily. 90 tablet 3 Past Week at Unknown time  . predniSONE (DELTASONE) 10 MG tablet 93fr 1 day,3x1d,2x1d,1x1d 10 tablet 0 Past Week at Unknown time  . PROAIR HFA 108 (90 Base) MCG/ACT inhaler Inhale 2 puffs into the lungs every 4 (four) hours as needed for wheezing or shortness of breath.   12 Past Week at Unknown time  . SitaGLIPtin-MetFORMIN HCl (JANUMET XR) [phone]  MG TB24 Take 1 tablet by mouth daily.   Past Week at Unknown time  . tamsulosin (FLOMAX) 0.4 MG CAPS capsule Take 0.4 mg by mouth daily.   Past Week at Unknown time  . tiotropium (SPIRIVA) 18 MCG inhalation capsule Place 18 mcg into inhaler and inhale daily.   Past Week at Unknown time   Scheduled: . albuterol  2.5 mg Nebulization TID  . aspirin EC  81 mg Oral QHS  . atorvastatin  40 mg Oral q1800  . cefdinir  300 mg Oral Q12H  . diltiazem  240 mg Oral Daily  . enoxaparin (LOVENOX) injection  40 mg Subcutaneous Q24H  . feeding supplement (ENSURE ENLIVE)  237 mL Oral BID BM  . fenofibrate  160 mg Oral Daily  . gabapentin  300 mg Oral QHS  . guaiFENesin  1,200 mg Oral BID  . insulin aspart  0-15 Units Subcutaneous TID WC  . insulin aspart  0-5 Units Subcutaneous QHS  . insulin aspart  3 Units Subcutaneous TID WC  . mouth rinse  15 mL Mouth Rinse BID  . pantoprazole  40 mg Oral Daily  . PARoxetine  20 mg Oral Daily  . predniSONE  20 mg Oral Q breakfast  . sodium chloride flush  3 mL Intravenous Q12H  . tamsulosin  0.4 mg Oral Daily  . tiotropium  18 mcg Inhalation Daily   Continuous: . sodium chloride     PXLK:GMWNUUchloride, acetaminophen **OR** acetaminophen, albuterol, ALPRAZolam, fluticasone, HYDROcodone-acetaminophen, ondansetron **OR** ondansetron (ZOFRAN) IV, senna-docusate, sodium chloride flush  Assesment: He came in with fever and initially was septic.  This is presumably from a pulmonary source.  His sputum culture is still pending.  There has been concerned that he may have Mycobacterium avium and culture is  pending for that as well.  He was treated with intravenous antibiotics and steroids and improved.  He was dehydrated on admission and that is much better.  He is ready for discharge Principal Problem:   Fever, unknown origin Active Problems:   Muscle weakness (generalized)   Non-alcoholic fatty liver disease   Splenomegaly   GERD (gastroesophageal reflux disease)    Thrombocytopenia ([name])   Dyslipidemia   Type 2 diabetes mellitus without complication, without long-term current use of insulin ([place])   Dehydration   Goiter   Suspected Mycobacterial infectious disease    Plan: Discharge home today    LOS: 8 days   [name] L 11/02/2017, 10:17 AM

## 2017-11-03 LAB — CULTURE, RESPIRATORY W GRAM STAIN: Special Requests: NORMAL

## 2017-11-03 LAB — CULTURE, RESPIRATORY

## 2017-11-04 DIAGNOSIS — J441 Chronic obstructive pulmonary disease with (acute) exacerbation: Secondary | ICD-10-CM | POA: Diagnosis not present

## 2017-11-04 LAB — GLUCOSE, CAPILLARY: Glucose-Capillary: 297 mg/dL — ABNORMAL HIGH (ref 65–99)

## 2017-11-05 ENCOUNTER — Other Ambulatory Visit: Payer: Self-pay

## 2017-11-05 NOTE — Patient Outreach (Signed)
[facility]) Care Management  11/05/2017  KADYN GUILD 10-Jan-1956 [id number]     EMMI- General Discharge RED ON EMMI ALERT Day # 1 Date: 11/04/17 Red Alert Reason: "Scheduled follow up? No"     Outreach attempt # 1 to patient.Spoke with patient who voices he is doing well. Reviewed and addressed red alert with patient. Patient has discharge paperwork and has several appts already scheduled for him prior to d/c from hospital. He is aware of these appts and has put them on his calendar. He voices that PCP called him yesterday to follow up. They are working on getting him an x-ray appt set up and will contact him back with appt date and time. He has transportation to get to appts. He voices that he has all his meds and understands how and when to take them. He is aware to seek medical attention for worsening s/s of conditions. He denies any further RN CM needs or concerns at this time. Advised patient that they would get one more automated EMMI-GENERAL post discharge calls to assess how they are doing following recent hospitalization and will receive a call from a nurse if any of their responses were abnormal. Patient voiced understanding and was appreciative of f/u call.       Plan: RN CM will close case at this time as no further interventions needed.   Enzo Montgomery, RN,BSN,CCM [facility] Telephonic Care Management Coordinator Direct Phone: [phone] Toll Free: [phone] Fax: [phone]

## 2017-11-06 DIAGNOSIS — J441 Chronic obstructive pulmonary disease with (acute) exacerbation: Secondary | ICD-10-CM | POA: Diagnosis not present

## 2017-11-14 DIAGNOSIS — E1165 Type 2 diabetes mellitus with hyperglycemia: Secondary | ICD-10-CM | POA: Diagnosis not present

## 2017-11-14 DIAGNOSIS — J9611 Chronic respiratory failure with hypoxia: Secondary | ICD-10-CM | POA: Diagnosis not present

## 2017-11-14 DIAGNOSIS — J449 Chronic obstructive pulmonary disease, unspecified: Secondary | ICD-10-CM | POA: Diagnosis not present

## 2017-11-14 DIAGNOSIS — L42 Pityriasis rosea: Secondary | ICD-10-CM | POA: Diagnosis not present

## 2017-11-21 ENCOUNTER — Other Ambulatory Visit (HOSPITAL_COMMUNITY): Payer: Self-pay | Admitting: Pulmonary Disease

## 2017-11-21 DIAGNOSIS — R918 Other nonspecific abnormal finding of lung field: Secondary | ICD-10-CM

## 2017-12-01 DIAGNOSIS — J441 Chronic obstructive pulmonary disease with (acute) exacerbation: Secondary | ICD-10-CM | POA: Diagnosis not present

## 2017-12-04 DIAGNOSIS — J441 Chronic obstructive pulmonary disease with (acute) exacerbation: Secondary | ICD-10-CM | POA: Diagnosis not present

## 2017-12-04 LAB — AFB ORGANISM ID BY DNA PROBE
M avium complex: POSITIVE — AB
M tuberculosis complex: NEGATIVE

## 2017-12-04 LAB — MAC SUSCEPTIBILITY BROTH
Ethambutol: 16
Linezolid: 16
Moxifloxacin: 2
Rifampin: 2
Streptomycin: 32

## 2017-12-04 LAB — ACID FAST CULTURE WITH REFLEXED SENSITIVITIES (MYCOBACTERIA): Acid Fast Culture: POSITIVE — AB

## 2017-12-07 DIAGNOSIS — J441 Chronic obstructive pulmonary disease with (acute) exacerbation: Secondary | ICD-10-CM | POA: Diagnosis not present

## 2017-12-11 ENCOUNTER — Ambulatory Visit (HOSPITAL_COMMUNITY): Payer: PPO

## 2017-12-11 ENCOUNTER — Ambulatory Visit (HOSPITAL_COMMUNITY)
Admission: RE | Admit: 2017-12-11 | Discharge: 2017-12-11 | Disposition: A | Discharge status: Home / Self Care | Payer: PPO | Source: Ambulatory Visit | Attending: Acute Care | Admitting: Acute Care

## 2017-12-11 DIAGNOSIS — I700 Atherosclerosis of aorta: Secondary | ICD-10-CM | POA: Insufficient documentation

## 2017-12-11 DIAGNOSIS — Z87891 Personal history of nicotine dependence: Secondary | ICD-10-CM | POA: Diagnosis not present

## 2017-12-11 DIAGNOSIS — J439 Emphysema, unspecified: Secondary | ICD-10-CM | POA: Diagnosis not present

## 2017-12-11 DIAGNOSIS — I2510 Atherosclerotic heart disease of native coronary artery without angina pectoris: Secondary | ICD-10-CM | POA: Diagnosis not present

## 2017-12-11 DIAGNOSIS — Z122 Encounter for screening for malignant neoplasm of respiratory organs: Secondary | ICD-10-CM | POA: Diagnosis not present

## 2017-12-18 ENCOUNTER — Encounter: Payer: Self-pay | Admitting: Family

## 2017-12-18 ENCOUNTER — Other Ambulatory Visit: Payer: Self-pay | Admitting: Acute Care

## 2017-12-18 ENCOUNTER — Ambulatory Visit (INDEPENDENT_AMBULATORY_CARE_PROVIDER_SITE_OTHER): Payer: PPO | Admitting: Family

## 2017-12-18 VITALS — BP 99/62 | HR 88 | Temp 98.0°F | Ht 67.0 in | Wt 157.0 lb

## 2017-12-18 DIAGNOSIS — Z122 Encounter for screening for malignant neoplasm of respiratory organs: Secondary | ICD-10-CM

## 2017-12-18 DIAGNOSIS — Z87891 Personal history of nicotine dependence: Secondary | ICD-10-CM

## 2017-12-18 DIAGNOSIS — A310 Pulmonary mycobacterial infection: Secondary | ICD-10-CM | POA: Insufficient documentation

## 2017-12-18 NOTE — Assessment & Plan Note (Signed)
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 He does not currently have fever, chills, night sweats or weight loss. We will need to collect an additional sputum culture to confirm the diagnosis. Based on his worsening of symptoms it appears he would be a candidate for treatment, however I am not sure how well he would tolerate the medication regimen. He will follow up in about 1 month once cultures have been completed. Information on antibiotic regimen was provided in AVS.

## 2017-12-18 NOTE — Progress Notes (Signed)
Subjective:    Patient ID: [name], male    DOB: 1955-08-09, 62 y.o.   MRN: [id number]  Chief Complaint  Patient presents with  . Mycobacterium Avium Complex Infection    HPI:  [name] is a 62 y.o. male who presents today for initial evaluation and treatment of suspected myobacterium avian complex.   [name] has past medical history of severe COPD at baseline and was recently admitted to the hospital in April 2019 with a fever of unknown origin with concern for possible atypical infection. CT scan showed scattered areas of mild tree-in-bud nodularity and mild bronchiectasis. Calcified granulomas in both lungs were noted. Sputum cultures were obtained and AFB smears were negative with AFB cultures returning positive for Myobacterium avian complex with susceptibility to clindamycin.  In the hospital he was treated with cefepime and cefdinir as well as steroids. He remained hypoxic and was discharged with oxygen.   PET scan results from February 2019 with suspected sequela of chronic atypical mycobacterial infection. Continues to have a productive cough with color changing sputum between green and white. Notes that he has produced more sputum lately. He continues to use oxygen at 2L. No current fevers, chills, night sweats or weight loss. He did have symptoms back in February of night sweats, rigors and fever.    Allergies  Allergen Reactions  . Codeine Rash  . Baclofen     lethargic   . Simvastatin Nausea Only      Outpatient Medications Prior to Visit  Medication Sig Dispense Refill  . acetaminophen (TYLENOL) 500 MG tablet Take 1,000 mg by mouth daily as needed for headache.    . albuterol (PROVENTIL) (2.5 MG/3ML) 0.083% nebulizer solution Take 3 mLs (2.5 mg total) by nebulization 3 (three) times daily. 75 mL 12  . albuterol (PROVENTIL) (2.5 MG/3ML) 0.083% nebulizer solution Take 3 mLs (2.5 mg total) by nebulization every 2 (two) hours as needed for wheezing. 75 mL 12   . ALPRAZolam (XANAX) 0.25 MG tablet Take 0.25 mg by mouth 2 (two) times daily.     [name] Apple Cider Vinegar 600 MG CAPS Take 2 capsules by mouth daily.    . Artificial Tear Ointment (REFRESH P.M. OP) Apply 1 application to eye daily as needed (dry eye). As directed     . aspirin EC 81 MG tablet Take 81 mg by mouth at bedtime.     [name] atorvastatin (LIPITOR) 40 MG tablet Take 1 tablet (40 mg total) by mouth daily. 90 tablet 3  . cefdinir (OMNICEF) 300 MG capsule Take 1 capsule (300 mg total) by mouth every 12 (twelve) hours. 14 capsule 1  . Choline Fenofibrate (FENOFIBRIC ACID) 135 MG CPDR Take 1 capsule by mouth daily. 90 capsule 3  . clotrimazole-betamethasone (LOTRISONE) cream Apply 1 application topically 2 (two) times daily as needed (psoriasis).    [name] diltiazem (CARDIZEM CD) 240 MG 24 hr capsule Take 1 capsule (240 mg total) by mouth daily. 90 capsule 3  . fluticasone (FLONASE) 50 MCG/ACT nasal spray Place 2 sprays into both nostrils daily as needed for allergies.     [name] gabapentin (NEURONTIN) 300 MG capsule Take 1 capsule by mouth at bedtime.  5  . HYDROcodone-acetaminophen (NORCO/VICODIN) 5-325 MG per tablet Take 1 tablet by mouth every 8 (eight) hours as needed for moderate pain. (Patient taking differently: Take 1 tablet by mouth daily as needed for moderate pain. ) 90 tablet 0  . JARDIANCE 25 MG TABS tablet Take 25  mg by mouth every evening.   3  . Multiple Vitamin (MULITIVITAMIN WITH MINERALS) TABS Take 1 tablet by mouth at bedtime.     . pantoprazole (PROTONIX) 40 MG tablet Take 1 tablet (40 mg total) by mouth daily. 90 tablet 3  . predniSONE (DELTASONE) 20 MG tablet Take 1 tablet (20 mg total) by mouth daily with breakfast. 5 tablet 0  . PROAIR HFA 108 (90 Base) MCG/ACT inhaler Inhale 2 puffs into the lungs every 4 (four) hours as needed for wheezing or shortness of breath.   12  . SitaGLIPtin-MetFORMIN HCl (JANUMET XR) [phone] MG TB24 Take 1 tablet by mouth daily.    . tamsulosin (FLOMAX)  0.4 MG CAPS capsule Take 0.4 mg by mouth daily.    [name] tiotropium (SPIRIVA) 18 MCG inhalation capsule Place 18 mcg into inhaler and inhale daily.     No facility-administered medications prior to visit.      Past Medical History:  Diagnosis Date  . Anxiety   . Asthma   . Chest pain    a. 2003 Cath: nl cors.  [name] COPD (chronic obstructive pulmonary disease) ([place])   . Depression   . Diabetes mellitus   . Dyslipidemia   . Dysrhythmia   . Fracture 12/15   lower back "L-1"  . GERD (gastroesophageal reflux disease)   . Hypertension   . PONV (postoperative nausea and vomiting)   . Splenomegaly       Past Surgical History:  Procedure Laterality Date  . BIOPSY  07/26/2016   Procedure: BIOPSY;  Surgeon: [name], MD;  Location: [facility];  Service: Endoscopy;;  gastric  . CARDIAC CATHETERIZATION  10/22/2001   normal coronary arteriea (Dr. Domenic Moras)  . COLONOSCOPY  08/31/2011   [id number] rectal and colon polyps-treated. Single tubular adenoma. Next TCS 08/2016  . COLONOSCOPY N/A 07/26/2016   Dr. [name]: diverticulosis in sigmoid and descending colon. 6 mm benign splenic flexure. surveillance 5 yeras  . ESOPHAGOGASTRODUODENOSCOPY N/A 07/26/2016   Dr. [name]: normal esophagus, portal gastropathy, chronic gastritis, normal duodenum, screening EGD 2 years   . ESOPHAGOGASTRODUODENOSCOPY (EGD) WITH ESOPHAGEAL DILATION N/A 05/15/2013   [id number] esophagus-status post Venia Minks dilation/Portal gastropathy. Antral erosions-status post biopsy. extrinsic compression along lesser curvature likely secondary to splenomegaly. gastric bx benign  . HERNIA REPAIR  03/2009  . POLYPECTOMY  07/26/2016   Procedure: POLYPECTOMY;  Surgeon: [name], MD;  Location: [facility];  Service: Endoscopy;;  colon  . TRANSTHORACIC ECHOCARDIOGRAM  2011   EF 29-56%, stage 1 diastolic dysfunction, trace TR & pulm valve regurg   . VASECTOMY  1989      Family History  Problem Relation Age of Onset  .  Heart disease Mother   . Cancer Mother        lymph nodes  . Diabetes Mother   . Arthritis Unknown   . Lung disease Unknown   . Asthma Unknown   . Kidney disease Unknown   . Ovarian cancer Sister   . Diabetes Brother   . Heart disease Brother   . Hypertension Brother   . Diabetes Sister   . Diabetes Sister   . Cirrhosis Sister 62       no etoh  . Colon cancer Neg Hx       Social History   Socioeconomic History  . Marital status: Married    Spouse name: Not on file  . Number of children: Not on file  . Years of education: Not on  file  . Highest education level: Not on file  Occupational History  . Not on file  Social Needs  . Financial resource strain: Not on file  . Food insecurity:    Worry: Not on file    Inability: Not on file  . Transportation needs:    Medical: Not on file    Non-medical: Not on file  Tobacco Use  . Smoking status: Former Smoker    Packs/day: 1.25    Years: 36.00    Pack years: 45.00    Types: Cigarettes    Last attempt to quit: 04/2011    Years since quitting: 6.6  . Smokeless tobacco: Former Systems developer  . Tobacco comment: quit x 7 years  Substance and Sexual Activity  . Alcohol use: No  . Drug use: No  . Sexual activity: Not on file  Lifestyle  . Physical activity:    Days per week: Not on file    Minutes per session: Not on file  . Stress: Not on file  Relationships  . Social connections:    Talks on phone: Not on file    Gets together: Not on file    Attends religious service: Not on file    Active member of club or organization: Not on file    Attends meetings of clubs or organizations: Not on file    Relationship status: Not on file  . Intimate partner violence:    Fear of current or ex partner: Not on file    Emotionally abused: Not on file    Physically abused: Not on file    Forced sexual activity: Not on file  Other Topics Concern  . Not on file  Social History Narrative  . Not on file      Review of Systems    Constitutional: Positive for fatigue. Negative for activity change, chills, diaphoresis, fever and unexpected weight change.  Respiratory: Positive for cough and shortness of breath. Negative for wheezing.   Cardiovascular: Negative for chest pain and leg swelling.  Gastrointestinal: Negative for abdominal distention and abdominal pain.       Objective:    BP 99/62   Pulse 88   Temp 98 F (36.7 C) (Oral)   Ht 5' 7"  (1.702 m)   Wt 157 lb (71.2 kg)   SpO2 96% Comment: 2 liters  BMI 24.59 kg/m  Nursing note and vital signs reviewed.  Physical Exam  Constitutional: He is oriented to person, place, and time. He appears well-developed and well-nourished. He appears ill. No distress.  Seated in the chair with nasal cannula in place. Pleasant demeanor.   Cardiovascular: Normal rate, regular rhythm, normal heart sounds and intact distal pulses.  Pulmonary/Chest: Effort normal. He has decreased breath sounds. He has no wheezes. He has no rhonchi. He has no rales.  Neurological: He is alert and oriented to person, place, and time.  Skin: Skin is warm and dry.  Psychiatric: He has a normal mood and affect. His behavior is normal. Judgment and thought content normal.        Assessment & Plan:   Problem List Items Addressed This Visit      Other   Mycobacterium avium infection ([place]) - Primary    Mr. 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 He does not currently have fever, chills, night sweats or weight loss. We will need to collect an additional sputum culture to confirm the diagnosis. Based on his worsening  of symptoms it appears he would be a candidate for treatment, however I am not sure how well he would tolerate the medication regimen. He will follow up in about 1 month once cultures have been completed. Information on antibiotic regimen was provided in AVS.       Relevant Orders   MYCOBACTERIA, CULTURE,  WITH FLUOROCHROME SMEAR   MYCOBACTERIA, CULTURE, WITH FLUOROCHROME SMEAR       I am having Gloriajean Dell maintain his aspirin EC, multivitamin with minerals, tamsulosin, ALPRAZolam, tiotropium, SitaGLIPtin-MetFORMIN HCl, HYDROcodone-acetaminophen, PROAIR HFA, Artificial Tear Ointment (REFRESH P.M. OP), fluticasone, acetaminophen, JARDIANCE, clotrimazole-betamethasone, diltiazem, atorvastatin, Fenofibric Acid, pantoprazole, gabapentin, Apple Cider Vinegar, cefdinir, predniSONE, albuterol, and albuterol.   Follow-up: Return in about 1 month (around 01/17/2018), or if symptoms worsen or fail to improve.   Mauricio Po, [facility]

## 2017-12-18 NOTE — Patient Instructions (Addendum)
Nice to meet you.   We will check your sputum cultures.  Do the best to obtain the sputum cultures in the morning.   Refridgerate them after collection and then return to the lab.  Follow up with MD in about 1 month.   Mycobacterium Avium Complex Mycobacterium avium complex (MAC) is an infection caused by two similar and very common germs. Most people who are exposed to these germs do not get infected. You may develop a MAC infection if your body's defense system (immune system) is weak from another disease, especially AIDS. Children and adults with lung disease may also get MAC even with a normal immune system. MAC causes two types of infection. If you have a normal immune system, you may get only a limited (local) infection. Local MAC infection in children usually causes swollen glands in the neck (cervical lymph nodes). Local infection in adults usually causes pneumonia. If your immune system is weak, you may get a form of MAC that is widespread (disseminated) throughout the body. This is more serious. What are the causes? MAC is caused by two germs: Mycobacterium avium and Mycobacterium intracellulare. These germs are often found in drinking water, hot tubs, swimming pools, house dust, and animals. You can become infected if you breathe in water particles or dust, or if you eat or drink anything contaminated with these germs. MAC can grow in pasteurized milk, and children may become infected from drinking milk. MAC does not spread from person to person. What increases the risk? The most common risk factor for disseminated MAC infection is having AIDS. Being a child is a risk factor for local MAC infection of the cervical lymph nodes. Risk factors for MAC pneumonia include:  Smoking.  Having tuberculosis.  Having chronic obstructive lung disease (COPD).  Having lung cancer.  Having cystic fibrosis.  Having other long-standing (chronic) lung diseases.  What are the signs or  symptoms? Signs and symptoms vary depending on the type of MAC infection.  The signs of MAC infection of the cervical lymph nodes include enlarged lymph nodes on one side of the neck, under the jaw, or around the ear. The lymph nodes will feel enlarged and rubbery.  MAC pneumonia may cause fatigue, shortness of breath, and a lasting cough that produces lots of mucus. Your health care provider may hear abnormal sounds when he or she listens to your lungs.  Disseminated MAC can spread through the body and cause many symptoms. The most common are: ? Fever. ? Night sweats. ? Weight loss. ? Loss of appetite. ? Fatigue.  How is this diagnosed? Your health care provider may suspect MAC infection from your symptoms, physical exam, and risk factors. Tests may be done to help confirm the diagnosis. These may include:  Chest X-ray or CT scan to look for MAC pneumonia.  Taking samples of sputum, blood, or other body fluids.  Taking a piece of body tissue for a sample (biopsy).  When samples are taken for diagnosis, they are checked to see if MAC will grow (cultured). It may take about 2 weeks for MAC to grow and be identified under a microscope. How is this treated? MAC is difficult to treat because it does not respond to many common antibiotic medicines. Treatment depends on the type of infection:  MAC infection of the cervical lymph nodes can be treated by surgically removing the infected lymph nodes. After the lymph nodes are removed, antibiotics are usually not needed.  MAC pneumonia may be treated with  two or more antibiotics. You may have to take these until cultures have not grown MAC for at least 1 year.  Disseminated MAC infection may be treated with three or four antibiotics that you have to take for at least 1 year. In some cases, treatment may continue for several years.  Follow these instructions at home: Take all antibiotic medicine as directed by your health care  provider. Contact a health care provider if:  You have chills or a fever.  You have a persistent cough.  You have loss of appetite or weight loss.  You feel very tired. Get help right away if:  You have a fever for more than 2-3 days.  You have trouble breathing.  You have chest pain. This information is not intended to replace advice given to you by your health care provider. Make sure you discuss any questions you have with your health care provider. Document Released: 06/30/2013 Document Revised: 12/01/2015 Document Reviewed: 05/19/2013 Elsevier Interactive Patient Education  2018 [employer].    Azithromycin tablets What is this medicine? AZITHROMYCIN (az ith roe MYE sin) is a macrolide antibiotic. It is used to treat or prevent certain kinds of bacterial infections. It will not work for colds, flu, or other viral infections. This medicine may be used for other purposes; ask your health care provider or pharmacist if you have questions. COMMON BRAND NAME(S): Zithromax, Zithromax Tri-Pak, Zithromax Z-Pak What should I tell my health care provider before I take this medicine? They need to know if you have any of these conditions: -kidney disease -liver disease -irregular heartbeat or heart disease -an unusual or allergic reaction to azithromycin, erythromycin, other macrolide antibiotics, foods, dyes, or preservatives -pregnant or trying to get pregnant -breast-feeding How should I use this medicine? Take this medicine by mouth with a full glass of water. Follow the directions on the prescription label. The tablets can be taken with food or on an empty stomach. If the medicine upsets your stomach, take it with food. Take your medicine at regular intervals. Do not take your medicine more often than directed. Take all of your medicine as directed even if you think your are better. Do not skip doses or stop your medicine early. Talk to your pediatrician regarding the use of  this medicine in children. While this drug may be prescribed for children as young as 6 months for selected conditions, precautions do apply. Overdosage: If you think you have taken too much of this medicine contact a poison control center or emergency room at once. NOTE: This medicine is only for you. Do not share this medicine with others. What if I miss a dose? If you miss a dose, take it as soon as you can. If it is almost time for your next dose, take only that dose. Do not take double or extra doses. What may interact with this medicine? Do not take this medicine with any of the following medications: -lincomycin This medicine may also interact with the following medications: -amiodarone -antacids -birth control pills -cyclosporine -digoxin -magnesium -nelfinavir -phenytoin -warfarin This list may not describe all possible interactions. Give your health care provider a list of all the medicines, herbs, non-prescription drugs, or dietary supplements you use. Also tell them if you smoke, drink alcohol, or use illegal drugs. Some items may interact with your medicine. What should I watch for while using this medicine? Tell your doctor or healthcare professional if your symptoms do not start to get better or if they get  worse. Do not treat diarrhea with over the counter products. Contact your doctor if you have diarrhea that lasts more than 2 days or if it is severe and watery. This medicine can make you more sensitive to the sun. Keep out of the sun. If you cannot avoid being in the sun, wear protective clothing and use sunscreen. Do not use sun lamps or tanning beds/booths. What side effects may I notice from receiving this medicine? Side effects that you should report to your doctor or health care professional as soon as possible: -allergic reactions like skin rash, itching or hives, swelling of the face, lips, or tongue -confusion, nightmares or hallucinations -dark  urine -difficulty breathing -hearing loss -irregular heartbeat or chest pain -pain or difficulty passing urine -redness, blistering, peeling or loosening of the skin, including inside the mouth -white patches or sores in the mouth -yellowing of the eyes or skin Side effects that usually do not require medical attention (report to your doctor or health care professional if they continue or are bothersome): -diarrhea -dizziness, drowsiness -headache -stomach upset or vomiting -tooth discoloration -vaginal irritation This list may not describe all possible side effects. Call your doctor for medical advice about side effects. You may report side effects to FDA at 1-800-FDA-1088. Where should I keep my medicine? Keep out of the reach of children. Store at room temperature between 15 and 30 degrees C (59 and 86 degrees F). Throw away any unused medicine after the expiration date. NOTE: This sheet is a summary. It may not cover all possible information. If you have questions about this medicine, talk to your doctor, pharmacist, or health care provider.  2018 Elsevier/Gold Standard (2015-08-23 15:26:03)  Ethambutol tablets What is this medicine? ETHAMBUTOL (e THAM byoo tole) is used to treat tuberculosis (TB). This medicine is never used alone, but always with another medicine. This medicine may be used for other purposes; ask your health care provider or pharmacist if you have questions. COMMON BRAND NAME(S): Myambutol What should I tell my health care provider before I take this medicine? They need to know if you have any of these conditions: -eye problems or disease -kidney disease -liver disease -an unusual or allergic reaction to ethambutol, other medicines, foods, dyes or preservatives -pregnant or trying to get pregnant -breast-feeding How should I use this medicine? Take this medicine by mouth with a glass of water. Follow the directions on the prescription label. Take this  medicine with or without food. Take your doses at regular intervals. Do not take your medicine more often than directed. For your therapy to work as well as possible, take each dose exactly as prescribed. Do not skip doses or stop your medicine even if you feel better. Skipping doses may make the TB resistant to this medicine and other medicines. Do not stop taking except on your doctor's advice. Contact your pediatrician or health care professional regarding the use of this medicine in children. While this drug may be prescribed for children as young as 92 years of age for selected conditions, precautions do apply. Overdosage: If you think you have taken too much of this medicine contact a poison control center or emergency room at once. NOTE: This medicine is only for you. Do not share this medicine with others. What if I miss a dose? If you miss a dose, take it as soon as you can. If it is almost time for your next dose, take only that dose. Do not take double or extra doses. What  may interact with this medicine? -antacids with aluminum -supplements or vitamins with aluminum This list may not describe all possible interactions. Give your health care provider a list of all the medicines, herbs, non-prescription drugs, or dietary supplements you use. Also tell them if you smoke, drink alcohol, or use illegal drugs. Some items may interact with your medicine. What should I watch for while using this medicine? Visit your doctor for regular check ups. Tell your doctor or healthcare professional if your symptoms do not start to get better or if they get worse. Tell your doctor or health care professional right away if you have any change in your eyesight. Do not take any medicines that contain aluminum for 4 hours before or after you take this medicine. What side effects may I notice from receiving this medicine? Side effects that you should report to your doctor or health care professional as soon as  possible: -allergic reactions like skin rash, itching or hives, swelling of the face, lips, or tongue -breathing problems -changes in vision -confusion, disoriented -fever or chills, sore throat -hallucinations -joint aches, pain, or swelling -numb or tingling pain in the hands or feet -trouble passing urine or change in the amount of urine -unusually weak or tired Side effects that usually do not require medical attention (report to your doctor or health care professional if they continue or are bothersome): -dizziness -headache -loss of appetite -nausea, vomiting -stomach pain This list may not describe all possible side effects. Call your doctor for medical advice about side effects. You may report side effects to FDA at 1-800-FDA-1088. Where should I keep my medicine? Keep out of the reach of children. Store at room temperature between 20 and 25 degrees C (68 and 77 degrees F). Throw away any unused medicine after the expiration date. NOTE: This sheet is a summary. It may not cover all possible information. If you have questions about this medicine, talk to your doctor, pharmacist, or health care provider.  2018 Elsevier/Gold Standard (2007-11-18 10:27:01)  Rifampin capsules What is this medicine? RIFAMPIN (RIF am pin) is an antibiotic. It is used to treat or prevent certain kinds of bacterial infections. It is used to treat or prevent tuberculosis (TB). It will not work for colds, flu, or other viral infections. This medicine may be used for other purposes; ask your health care provider or pharmacist if you have questions. COMMON BRAND NAME(S): Rifadin, Rimactane What should I tell my health care provider before I take this medicine? They need to know if you have any of these conditions: -diabetes -HIV or AIDS -if you often drink alcohol -liver disease -wear contact lenses -an unusual or allergic reaction to rifampin, rifabutin, other medicines, foods, dyes or  preservatives -pregnant or trying to get pregnant -breast-feeding How should I use this medicine? Take this medicine by mouth with a glass of water. Follow the directions on the prescription label. Take this medicine on an empty stomach, either 1 hour before or 2 hours after food. Take your doses at regular intervals. Do not take your medicine more often than directed. For your therapy to work as well as possible, take each dose exactly as prescribed. Do not skip doses or stop your medicine even if you feel better. Skipping doses may make the TB resistant to this medicine and other medicines. Do not stop taking except on your doctor's advice. Contact your pediatrician or health care professional regarding the use of this medicine in children. Special care may be needed. Overdosage:  If you think you have taken too much of this medicine contact a poison control center or emergency room at once. NOTE: This medicine is only for you. Do not share this medicine with others. What if I miss a dose? If you miss a dose, take it as soon as you can. If it is almost time for your next dose, take only that dose. Do not take double or extra doses. What may interact with this medicine? Do not take this medicine with any of the following medications: -delavirdine -nevirapine -sirolimus -voriconazole This medicine may also interact with the following medications: -antibiotics like ciprofloxacin, clarithromycin, isoniazid -antifungal medicines like fluconazole, itraconazole, ketoconazole -atovaquone -chloramphenicol -cyclosporine -dapsone -male hormones, including contraceptive or birth control pills -halothane -medicines for blood pressure, other heart problems -medicines for depression, anxiety, or psychotic disturbances -medicines for diabetes -medicines for pain -medicnes for seizures like carbamazepine, phenobarbital, phenytoin -medicines for sleep -medicines for the thyroid -medicines that treat  or prevent blood clots like warfarin -probenecid -steroid medicines like prednisone or cortisone -vitamin D This list may not describe all possible interactions. Give your health care provider a list of all the medicines, herbs, non-prescription drugs, or dietary supplements you use. Also tell them if you smoke, drink alcohol, or use illegal drugs. Some items may interact with your medicine. What should I watch for while using this medicine? Tell your doctor or healthcare professional if your symptoms do not start to get better or if they get worse. Do not treat diarrhea with over the counter products. Contact your doctor if you have diarrhea that lasts more than 2 days or if it is severe and watery. This medicine can color your teeth, urine, sweat, tears, and mucous. The color may stain your teeth for good. The color in tears may also stain soft contact lenses for good. If you wear contact lenses, ask your doctor or health care professional when you can use your lenses again. You may need blood work done while you are taking this medicine. Birth control pills may not work properly while you are taking this medicine. Talk to your doctor about using an extra method of birth control. What side effects may I notice from receiving this medicine? Side effects that you should report to your doctor or health care professional as soon as possible: -allergic reactions like skin rash, itching or hives, swelling of the face, lips, or tongue -bloody or watery diarrhea -breathing problems -bruising, pinpoint red spots on the skin, black, tarry stools, blood in the urine -feeling faint or lightheaded, falls -fever or chills -fever with rash, swollen lymph nodes, or swelling of the face -general ill feeling or flu-like symptoms -mouth sores -redness, blistering, peeling or loosening of the skin, including inside the mouth -sore throat -stomach pain -trouble passing urine or change in the amount of  urine -unusual bleeding, bruising -unusually weak or tired -yellowing of the eyes or skin Side effects that usually do not require medical attention (report to your doctor or health care professional if they continue or are bothersome): -diarrhea -dizziness -drowsiness -confusion -headache -loss of appetite -nausea, vomiting This list may not describe all possible side effects. Call your doctor for medical advice about side effects. You may report side effects to FDA at 1-800-FDA-1088. Where should I keep my medicine? Keep out of the reach of children. Store at room temperature below 30 degrees C (86 degrees F). Protect from light and moisture. Keep container tightly closed. Throw away any unused medicine after the  expiration date. NOTE: This sheet is a summary. It may not cover all possible information. If you have questions about this medicine, talk to your doctor, pharmacist, or health care provider.  2018 Elsevier/Gold Standard (2016-07-18 14:14:10)

## 2017-12-23 ENCOUNTER — Other Ambulatory Visit: Payer: PPO

## 2017-12-23 DIAGNOSIS — A310 Pulmonary mycobacterial infection: Secondary | ICD-10-CM | POA: Diagnosis not present

## 2017-12-24 ENCOUNTER — Other Ambulatory Visit (HOSPITAL_COMMUNITY): Payer: Self-pay | Admitting: Pulmonary Disease

## 2017-12-24 DIAGNOSIS — R609 Edema, unspecified: Secondary | ICD-10-CM

## 2017-12-24 DIAGNOSIS — J449 Chronic obstructive pulmonary disease, unspecified: Secondary | ICD-10-CM | POA: Diagnosis not present

## 2017-12-24 DIAGNOSIS — E1140 Type 2 diabetes mellitus with diabetic neuropathy, unspecified: Secondary | ICD-10-CM | POA: Diagnosis not present

## 2017-12-24 DIAGNOSIS — J9611 Chronic respiratory failure with hypoxia: Secondary | ICD-10-CM | POA: Diagnosis not present

## 2017-12-24 DIAGNOSIS — R600 Localized edema: Secondary | ICD-10-CM | POA: Diagnosis not present

## 2017-12-27 ENCOUNTER — Ambulatory Visit (HOSPITAL_COMMUNITY)
Admission: RE | Admit: 2017-12-27 | Discharge: 2017-12-27 | Disposition: A | Discharge status: Home / Self Care | Payer: PPO | Source: Ambulatory Visit | Attending: Pulmonary Disease | Admitting: Pulmonary Disease

## 2017-12-27 DIAGNOSIS — K219 Gastro-esophageal reflux disease without esophagitis: Secondary | ICD-10-CM | POA: Insufficient documentation

## 2017-12-27 DIAGNOSIS — E119 Type 2 diabetes mellitus without complications: Secondary | ICD-10-CM | POA: Diagnosis not present

## 2017-12-27 DIAGNOSIS — E785 Hyperlipidemia, unspecified: Secondary | ICD-10-CM | POA: Diagnosis not present

## 2017-12-27 DIAGNOSIS — I10 Essential (primary) hypertension: Secondary | ICD-10-CM | POA: Insufficient documentation

## 2017-12-27 DIAGNOSIS — R609 Edema, unspecified: Secondary | ICD-10-CM | POA: Diagnosis not present

## 2017-12-27 DIAGNOSIS — R600 Localized edema: Secondary | ICD-10-CM | POA: Insufficient documentation

## 2017-12-27 DIAGNOSIS — J449 Chronic obstructive pulmonary disease, unspecified: Secondary | ICD-10-CM | POA: Insufficient documentation

## 2017-12-27 NOTE — Progress Notes (Signed)
*  PRELIMINARY RESULTS* Echocardiogram 2D Echocardiogram has been performed.  Samuel [place] 12/27/2017, 11:18 AM

## 2018-01-01 ENCOUNTER — Ambulatory Visit (HOSPITAL_COMMUNITY)
Admission: RE | Admit: 2018-01-01 | Discharge: 2018-01-01 | Disposition: A | Discharge status: Home / Self Care | Payer: PPO | Source: Ambulatory Visit | Attending: Pulmonary Disease | Admitting: Pulmonary Disease

## 2018-01-01 ENCOUNTER — Other Ambulatory Visit (HOSPITAL_COMMUNITY): Payer: Self-pay | Admitting: Pulmonary Disease

## 2018-01-01 DIAGNOSIS — M79605 Pain in left leg: Secondary | ICD-10-CM | POA: Diagnosis not present

## 2018-01-01 DIAGNOSIS — E1140 Type 2 diabetes mellitus with diabetic neuropathy, unspecified: Secondary | ICD-10-CM | POA: Diagnosis not present

## 2018-01-01 DIAGNOSIS — R609 Edema, unspecified: Secondary | ICD-10-CM | POA: Diagnosis not present

## 2018-01-01 DIAGNOSIS — M79606 Pain in leg, unspecified: Secondary | ICD-10-CM | POA: Diagnosis not present

## 2018-01-01 DIAGNOSIS — M7989 Other specified soft tissue disorders: Secondary | ICD-10-CM | POA: Diagnosis not present

## 2018-01-01 DIAGNOSIS — M79604 Pain in right leg: Secondary | ICD-10-CM | POA: Insufficient documentation

## 2018-01-01 DIAGNOSIS — J449 Chronic obstructive pulmonary disease, unspecified: Secondary | ICD-10-CM | POA: Diagnosis not present

## 2018-01-01 DIAGNOSIS — J441 Chronic obstructive pulmonary disease with (acute) exacerbation: Secondary | ICD-10-CM | POA: Diagnosis not present

## 2018-01-01 DIAGNOSIS — M79662 Pain in left lower leg: Secondary | ICD-10-CM | POA: Diagnosis not present

## 2018-01-01 DIAGNOSIS — M79661 Pain in right lower leg: Secondary | ICD-10-CM | POA: Diagnosis not present

## 2018-01-04 DIAGNOSIS — J441 Chronic obstructive pulmonary disease with (acute) exacerbation: Secondary | ICD-10-CM | POA: Diagnosis not present

## 2018-01-06 DIAGNOSIS — I872 Venous insufficiency (chronic) (peripheral): Secondary | ICD-10-CM | POA: Diagnosis not present

## 2018-01-06 DIAGNOSIS — I10 Essential (primary) hypertension: Secondary | ICD-10-CM | POA: Diagnosis not present

## 2018-01-06 DIAGNOSIS — J449 Chronic obstructive pulmonary disease, unspecified: Secondary | ICD-10-CM | POA: Diagnosis not present

## 2018-01-06 DIAGNOSIS — F419 Anxiety disorder, unspecified: Secondary | ICD-10-CM | POA: Diagnosis not present

## 2018-01-06 DIAGNOSIS — J441 Chronic obstructive pulmonary disease with (acute) exacerbation: Secondary | ICD-10-CM | POA: Diagnosis not present

## 2018-01-08 ENCOUNTER — Telehealth: Payer: Self-pay | Admitting: Internal Medicine

## 2018-01-08 NOTE — Telephone Encounter (Signed)
Letter mailed

## 2018-01-08 NOTE — Telephone Encounter (Signed)
RECALL FOR ULTRASOUND 

## 2018-01-15 ENCOUNTER — Encounter: Payer: Self-pay | Admitting: Family

## 2018-01-15 ENCOUNTER — Ambulatory Visit (INDEPENDENT_AMBULATORY_CARE_PROVIDER_SITE_OTHER): Payer: PPO | Admitting: Family

## 2018-01-15 VITALS — BP 107/64 | HR 97 | Temp 97.8°F | Resp 22 | Ht 67.0 in | Wt 162.0 lb

## 2018-01-15 DIAGNOSIS — A310 Pulmonary mycobacterial infection: Secondary | ICD-10-CM

## 2018-01-15 NOTE — Assessment & Plan Note (Signed)
Diagnosis of MAC infection remains unconfirmed with cultures remaining pending. He looks better today than when previously seen. Recommend continuing to hold treatment pending the results of the sputum cultures from 6/17. If these cultures are negative would recommend a bronchoscopy and repeat cultures to ensure definitive results. Discussed the possibility of treatment and also watchful waiting depending upon symptoms. Plan to follow up and update plan of care pending culture results.

## 2018-01-15 NOTE — Progress Notes (Signed)
Subjective:    Patient ID: [name], male    DOB: February 29, 1956, 62 y.o.   MRN: [id number]  Chief Complaint  Patient presents with  . Follow-up    HPI:  [name] is a 62 y.o. 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 His wife is present for today's visit.   Mr. Sawyers was last seen in the office on 12/18/17 with 1 positive AFB culture for Mycobacterium avium complex infection in the setting of severe COPD. At the time additional sputum cultures were ordered and then obtained on 6/17. As of today cultures remain pending with no growth to date.  He is currently on a course of doxycyline prescribed by his pulmonologist Dr. [name] for COPD exacerbation. He is feeling okay today. Describes coughing spells every once in a while at most a couple of days per week. No fevers, chills, or sweats.    Allergies  Allergen Reactions  . Baclofen     lethargic   . Simvastatin Nausea Only      Outpatient Medications Prior to Visit  Medication Sig Dispense Refill  . acetaminophen (TYLENOL) 500 MG tablet Take 1,000 mg by mouth daily as needed for headache.    . albuterol (PROVENTIL) (2.5 MG/3ML) 0.083% nebulizer solution Take 3 mLs (2.5 mg total) by nebulization 3 (three) times daily. 75 mL 12  . albuterol (PROVENTIL) (2.5 MG/3ML) 0.083% nebulizer solution Take 3 mLs (2.5 mg total) by nebulization every 2 (two) hours as needed for wheezing. 75 mL 12  . ALPRAZolam (XANAX) 0.25 MG tablet Take 0.25 mg by mouth 2 (two) times daily.     . Artificial Tear Ointment (REFRESH P.M. OP) Apply 1 application to eye daily as needed (dry eye). As directed     . aspirin EC 81 MG tablet Take 81 mg by mouth at bedtime.     [name] atorvastatin (LIPITOR) 40 MG tablet Take 1 tablet (40 mg total) by mouth daily. 90 tablet 3  . Choline Fenofibrate (FENOFIBRIC ACID) 135 MG CPDR Take 1 capsule by mouth daily. 90 capsule 3  . clotrimazole-betamethasone (LOTRISONE)  cream Apply 1 application topically 2 (two) times daily as needed (psoriasis).    [name] diltiazem (CARDIZEM CD) 240 MG 24 hr capsule Take 1 capsule (240 mg total) by mouth daily. 90 capsule 3  . doxycycline (VIBRAMYCIN) 100 MG capsule Take 100 mg by mouth 2 (two) times daily.  0  . fluticasone (FLONASE) 50 MCG/ACT nasal spray Place 2 sprays into both nostrils daily as needed for allergies.     [name] gabapentin (NEURONTIN) 300 MG capsule Take 1 capsule by mouth at bedtime.  5  . HYDROcodone-acetaminophen (NORCO/VICODIN) 5-325 MG per tablet Take 1 tablet by mouth every 8 (eight) hours as needed for moderate pain. (Patient taking differently: Take 1 tablet by mouth daily as needed for moderate pain. ) 90 tablet 0  . JARDIANCE 25 MG TABS tablet Take 25 mg by mouth every evening.   3  . Multiple Vitamin (MULITIVITAMIN WITH MINERALS) TABS Take 1 tablet by mouth at bedtime.     . pantoprazole (PROTONIX) 40 MG tablet Take 1 tablet (40 mg total) by mouth daily. 90 tablet 3  . PROAIR HFA 108 (90 Base) MCG/ACT inhaler Inhale 2 puffs into the lungs every 4 (four) hours as needed for wheezing or shortness of breath.   12  . SitaGLIPtin-MetFORMIN HCl (JANUMET XR) [phone] MG TB24 Take 1 tablet by mouth daily.    [name]  tamsulosin (FLOMAX) 0.4 MG CAPS capsule Take 0.4 mg by mouth daily.    [name] tiotropium (SPIRIVA) 18 MCG inhalation capsule Place 18 mcg into inhaler and inhale daily.    [name] Apple Cider Vinegar 600 MG CAPS Take 2 capsules by mouth daily.    . cefdinir (OMNICEF) 300 MG capsule Take 1 capsule (300 mg total) by mouth every 12 (twelve) hours. (Patient not taking: Reported on 01/15/2018) 14 capsule 1  . predniSONE (DELTASONE) 20 MG tablet Take 1 tablet (20 mg total) by mouth daily with breakfast. (Patient not taking: Reported on 01/15/2018) 5 tablet 0   No facility-administered medications prior to visit.      Past Medical History:  Diagnosis Date  . Anxiety   . Asthma   . Chest pain    a. 2003 Cath: nl cors.  [name]  COPD (chronic obstructive pulmonary disease) ([place])   . Depression   . Diabetes mellitus   . Dyslipidemia   . Dysrhythmia   . Fracture 12/15   lower back "L-1"  . GERD (gastroesophageal reflux disease)   . Hypertension   . PONV (postoperative nausea and vomiting)   . Splenomegaly      Past Surgical History:  Procedure Laterality Date  . BIOPSY  07/26/2016   Procedure: BIOPSY;  Surgeon: [name], MD;  [facility];  Service: Endoscopy;;  gastric  . CARDIAC CATHETERIZATION  10/22/2001   normal coronary arteriea (Dr. Domenic Moras)  . COLONOSCOPY  08/31/2011   MKL:[id number] rectal and colon polyps-treated. Single tubular adenoma. Next TCS 08/2016  . COLONOSCOPY N/A 07/26/2016   Dr. [name]: diverticulosis in sigmoid and descending colon. 6 mm benign splenic flexure. surveillance 5 yeras  . ESOPHAGOGASTRODUODENOSCOPY N/A 07/26/2016   Dr. [name]: normal esophagus, portal gastropathy, chronic gastritis, normal duodenum, screening EGD 2 years   . ESOPHAGOGASTRODUODENOSCOPY (EGD) WITH ESOPHAGEAL DILATION N/A 05/15/2013   VWP:[id number] esophagus-status post Venia Minks dilation/Portal gastropathy. Antral erosions-status post biopsy. extrinsic compression along lesser curvature likely secondary to splenomegaly. gastric bx benign  . HERNIA REPAIR  03/2009  . POLYPECTOMY  07/26/2016   Procedure: POLYPECTOMY;  Surgeon: [name], MD;  [facility];  Service: Endoscopy;;  colon  . TRANSTHORACIC ECHOCARDIOGRAM  2011   EF 65-53%, stage 1 diastolic dysfunction, trace TR & pulm valve regurg   . VASECTOMY  1989    Review of Systems  Constitutional: Negative for chills, diaphoresis, fatigue and fever.  Respiratory: Negative for chest tightness, shortness of breath and wheezing.   Cardiovascular: Negative for chest pain and leg swelling.  Neurological: Negative for dizziness and weakness.      Objective:    BP 107/64   Pulse 97   Temp 97.8 F (36.6 C) (Oral)   Resp (!) 22   Ht 5'  7" (1.702 m)   Wt 162 lb (73.5 kg)   SpO2 91% Comment: on room air. Left O2 tank in the car.  Refused O2 in office  BMI 25.37 kg/m  Nursing note and vital signs reviewed.  Physical Exam  Constitutional: He is oriented to person, place, and time. He appears well-developed and well-nourished. No distress.  Seated in the chair, pleasant. Not currently wearing oxygen.   Cardiovascular: Normal rate, regular rhythm, normal heart sounds and intact distal pulses. Exam reveals no gallop and no friction rub.  No murmur heard. Pulmonary/Chest: Effort normal and breath sounds normal.  Neurological: He is alert and oriented to person, place, and time.  Skin: Skin is warm  and dry.  Psychiatric: He has a normal mood and affect. His behavior is normal. Judgment and thought content normal.       Assessment & Plan:   Problem List Items Addressed This Visit      Other   Mycobacterium avium infection ([place]) - Primary    Diagnosis of MAC infection remains unconfirmed with cultures remaining pending. He looks better today than when previously seen. Recommend continuing to hold treatment pending the results of the sputum cultures from 6/17. If these cultures are negative would recommend a bronchoscopy and repeat cultures to ensure definitive results. Discussed the possibility of treatment and also watchful waiting depending upon symptoms. Plan to follow up and update plan of care pending culture results.           I am having Gloriajean Dell maintain his aspirin EC, multivitamin with minerals, tamsulosin, ALPRAZolam, tiotropium, SitaGLIPtin-MetFORMIN HCl, HYDROcodone-acetaminophen, PROAIR HFA, Artificial Tear Ointment (REFRESH P.M. OP), fluticasone, acetaminophen, JARDIANCE, clotrimazole-betamethasone, diltiazem, atorvastatin, Fenofibric Acid, pantoprazole, gabapentin, Apple Cider Vinegar, cefdinir, predniSONE, albuterol, albuterol, and doxycycline.   Follow-up: Pending culture results   Terri Piedra,  MSN, [facility]

## 2018-01-15 NOTE — Patient Instructions (Signed)
Nice to see you.  We will continue to await your cultures.  Our options include treatment if the cultures are positive, a bronchoscopy if the cultures are negative or waiting and monitoring  Cultures should be back by the end of the month.   Discuss with Dr. [name] about the possibility of a bronchoscopy if the cultures are negative.   Follow up pending culture results and plan of care.

## 2018-01-17 NOTE — Telephone Encounter (Signed)
Can you make sure this patient was notified of his results? Thanks so much.

## 2018-01-21 DIAGNOSIS — F419 Anxiety disorder, unspecified: Secondary | ICD-10-CM | POA: Diagnosis not present

## 2018-01-21 DIAGNOSIS — I872 Venous insufficiency (chronic) (peripheral): Secondary | ICD-10-CM | POA: Diagnosis not present

## 2018-01-21 DIAGNOSIS — I10 Essential (primary) hypertension: Secondary | ICD-10-CM | POA: Diagnosis not present

## 2018-01-21 DIAGNOSIS — J449 Chronic obstructive pulmonary disease, unspecified: Secondary | ICD-10-CM | POA: Diagnosis not present

## 2018-01-21 NOTE — Telephone Encounter (Signed)
Per chart review, pt was informed of PET results by Dr Pasty Spillers Higgs.  Pt was also hospitalized twice for possible Mycobacterium Avium and is being seen by infectious disease and Dr [name].  We also did a 6 month CT Chest LCS on 12/27/17 that was a Lung RADS 2.  Pt was notified of those results.

## 2018-01-21 NOTE — Telephone Encounter (Signed)
He will need a 12 month follow up scan 12/2018. It looks like it is ordered, I am sure you already have him on the tickle list.Thanks so much [place].

## 2018-01-24 DIAGNOSIS — E1140 Type 2 diabetes mellitus with diabetic neuropathy, unspecified: Secondary | ICD-10-CM | POA: Diagnosis not present

## 2018-01-24 DIAGNOSIS — J449 Chronic obstructive pulmonary disease, unspecified: Secondary | ICD-10-CM | POA: Diagnosis not present

## 2018-01-24 DIAGNOSIS — I10 Essential (primary) hypertension: Secondary | ICD-10-CM | POA: Diagnosis not present

## 2018-01-24 DIAGNOSIS — J9611 Chronic respiratory failure with hypoxia: Secondary | ICD-10-CM | POA: Diagnosis not present

## 2018-01-31 DIAGNOSIS — J441 Chronic obstructive pulmonary disease with (acute) exacerbation: Secondary | ICD-10-CM | POA: Diagnosis not present

## 2018-02-03 ENCOUNTER — Ambulatory Visit: Payer: PPO | Admitting: Gastroenterology

## 2018-02-03 DIAGNOSIS — J441 Chronic obstructive pulmonary disease with (acute) exacerbation: Secondary | ICD-10-CM | POA: Diagnosis not present

## 2018-02-04 ENCOUNTER — Encounter: Payer: Self-pay | Admitting: Gastroenterology

## 2018-02-04 ENCOUNTER — Ambulatory Visit (INDEPENDENT_AMBULATORY_CARE_PROVIDER_SITE_OTHER): Payer: PPO | Admitting: Gastroenterology

## 2018-02-04 VITALS — BP 119/69 | HR 109 | Temp 97.2°F | Ht 67.0 in | Wt 160.2 lb

## 2018-02-04 DIAGNOSIS — K7581 Nonalcoholic steatohepatitis (NASH): Secondary | ICD-10-CM | POA: Diagnosis not present

## 2018-02-04 NOTE — Patient Instructions (Signed)
I am glad you are doing better!  We will see you in 6 months. I have ordered an ultrasound of your liver in the meantime.  At your next appointment, we will arrange an upper endoscopy!  It was a pleasure to see you today. I strive to create trusting relationships with patients to provide genuine, compassionate, and quality care. I value your feedback. If you receive a survey regarding your visit,  I greatly appreciate you taking time to fill this out.   [name], PhD, ANP-BC [facility]

## 2018-02-04 NOTE — Progress Notes (Signed)
Referring Provider: Sinda Du, MD Primary Care Physician:  Sinda Du, MD Primary GI: Dr. [name]   Chief Complaint  Patient presents with  . Gastroesophageal Reflux    doing ok    HPI:   [name] is a 62 y.o. male presenting today with a history of biopsy proven NASH fibrosis, elastography F2/F3, chronic thrombocytopenia, splenomegaly, portal gastropathy. EGD due in 2020. Last completed in Jan 2018 with portal gastropathy. Colonoscopy due again in 2023.   Here for routine follow-up. He has been hospitalized since last seen for possible mycobacterium avium infection. He is followed by ID. He is on 2 liters oxygen currently. From a GI perspective, he is doing quite well. No abdominal pain, N/V, dysphagia, bowel habit changes, rectal bleeding.    Past Medical History:  Diagnosis Date  . Anxiety   . Asthma   . Chest pain    a. 2003 Cath: nl cors.  [name] COPD (chronic obstructive pulmonary disease) ([place])   . Depression   . Diabetes mellitus   . Dyslipidemia   . Dysrhythmia   . Fracture 12/15   lower back "L-1"  . GERD (gastroesophageal reflux disease)   . Hypertension   . PONV (postoperative nausea and vomiting)   . Splenomegaly     Past Surgical History:  Procedure Laterality Date  . BIOPSY  07/26/2016   Procedure: BIOPSY;  Surgeon: [name], MD;  [facility];  Service: Endoscopy;;  gastric  . CARDIAC CATHETERIZATION  10/22/2001   normal coronary arteriea (Dr. Domenic Moras)  . COLONOSCOPY  08/31/2011   ION:[id number] rectal and colon polyps-treated. Single tubular adenoma. Next TCS 08/2016  . COLONOSCOPY N/A 07/26/2016   Dr. [name]: diverticulosis in sigmoid and descending colon. 6 mm benign splenic flexure. surveillance 5 yeras  . ESOPHAGOGASTRODUODENOSCOPY N/A 07/26/2016   Dr. [name]: normal esophagus, portal gastropathy, chronic gastritis, normal duodenum, screening EGD 2 years   . ESOPHAGOGASTRODUODENOSCOPY (EGD) WITH ESOPHAGEAL DILATION N/A  05/15/2013   LKG:[id number] esophagus-status post Venia Minks dilation/Portal gastropathy. Antral erosions-status post biopsy. extrinsic compression along lesser curvature likely secondary to splenomegaly. gastric bx benign  . HERNIA REPAIR  03/2009  . POLYPECTOMY  07/26/2016   Procedure: POLYPECTOMY;  Surgeon: [name], MD;  [facility];  Service: Endoscopy;;  colon  . TRANSTHORACIC ECHOCARDIOGRAM  2011   EF 25-36%, stage 1 diastolic dysfunction, trace TR & pulm valve regurg   . VASECTOMY  1989    Current Outpatient Medications  Medication Sig Dispense Refill  . acetaminophen (TYLENOL) 500 MG tablet Take 1,000 mg by mouth daily as needed for headache.    . albuterol (PROVENTIL) (2.5 MG/3ML) 0.083% nebulizer solution Take 3 mLs (2.5 mg total) by nebulization 3 (three) times daily. 75 mL 12  . albuterol (PROVENTIL) (2.5 MG/3ML) 0.083% nebulizer solution Take 3 mLs (2.5 mg total) by nebulization every 2 (two) hours as needed for wheezing. 75 mL 12  . ALPRAZolam (XANAX) 0.25 MG tablet Take 0.25 mg by mouth 2 (two) times daily.     [name] aspirin EC 81 MG tablet Take 81 mg by mouth at bedtime.     [name] atorvastatin (LIPITOR) 40 MG tablet Take 1 tablet (40 mg total) by mouth daily. 90 tablet 3  . Choline Fenofibrate (FENOFIBRIC ACID) 135 MG CPDR Take 1 capsule by mouth daily. 90 capsule 3  . clotrimazole-betamethasone (LOTRISONE) cream Apply 1 application topically 2 (two) times daily as needed (psoriasis).    [name] diltiazem (CARDIZEM CD)  240 MG 24 hr capsule Take 1 capsule (240 mg total) by mouth daily. 90 capsule 3  . fluticasone (FLONASE) 50 MCG/ACT nasal spray Place 2 sprays into both nostrils daily as needed for allergies.     [name] gabapentin (NEURONTIN) 300 MG capsule Take 1 capsule by mouth at bedtime.  5  . HYDROcodone-acetaminophen (NORCO/VICODIN) 5-325 MG per tablet Take 1 tablet by mouth every 8 (eight) hours as needed for moderate pain. (Patient taking differently: Take 1 tablet by mouth daily  as needed for moderate pain. ) 90 tablet 0  . JARDIANCE 25 MG TABS tablet Take 25 mg by mouth every evening.   3  . Multiple Vitamin (MULITIVITAMIN WITH MINERALS) TABS Take 1 tablet by mouth at bedtime.     . pantoprazole (PROTONIX) 40 MG tablet Take 1 tablet (40 mg total) by mouth daily. 90 tablet 3  . PROAIR HFA 108 (90 Base) MCG/ACT inhaler Inhale 2 puffs into the lungs every 4 (four) hours as needed for wheezing or shortness of breath.   12  . SitaGLIPtin-MetFORMIN HCl (JANUMET XR) [phone] MG TB24 Take 1 tablet by mouth daily.    . tamsulosin (FLOMAX) 0.4 MG CAPS capsule Take 0.4 mg by mouth daily.    [name] tiotropium (SPIRIVA) 18 MCG inhalation capsule Place 18 mcg into inhaler and inhale daily.     No current facility-administered medications for this visit.     Allergies as of 02/04/2018 - Review Complete 02/04/2018  Allergen Reaction Noted  . Baclofen  07/24/2016  . Simvastatin Nausea Only 10/05/2013    Family History  Problem Relation Age of Onset  . Heart disease Mother   . Cancer Mother        lymph nodes  . Diabetes Mother   . Arthritis Unknown   . Lung disease Unknown   . Asthma Unknown   . Kidney disease Unknown   . Ovarian cancer Sister   . Diabetes Brother   . Heart disease Brother   . Hypertension Brother   . Diabetes Sister   . Diabetes Sister   . Cirrhosis Sister 82       no etoh  . Colon cancer Neg Hx     Social History   Socioeconomic History  . Marital status: Married    Spouse name: Not on file  . Number of children: Not on file  . Years of education: Not on file  . Highest education level: Not on file  Occupational History  . Not on file  Social Needs  . Financial resource strain: Not on file  . Food insecurity:    Worry: Not on file    Inability: Not on file  . Transportation needs:    Medical: Not on file    Non-medical: Not on file  Tobacco Use  . Smoking status: Former Smoker    Packs/day: 1.25    Years: 36.00    Pack years: 45.00      Types: Cigarettes    Last attempt to quit: 04/2011    Years since quitting: 6.8  . Smokeless tobacco: Former Systems developer  . Tobacco comment: quit x 7 years  Substance and Sexual Activity  . Alcohol use: No  . Drug use: No  . Sexual activity: Not on file  Lifestyle  . Physical activity:    Days per week: Not on file    Minutes per session: Not on file  . Stress: Not on file  Relationships  . Social connections:    Talks  on phone: Not on file    Gets together: Not on file    Attends religious service: Not on file    Active member of club or organization: Not on file    Attends meetings of clubs or organizations: Not on file    Relationship status: Not on file  Other Topics Concern  . Not on file  Social History Narrative  . Not on file    Review of Systems: As mentioned in HPI   Physical Exam: BP 119/69   Pulse (!) 109   Temp (!) 97.2 F (36.2 C) (Oral)   Ht 5' 7"  (1.702 m)   Wt 160 lb 3.2 oz (72.7 kg)   BMI 25.09 kg/m  General:   Alert and oriented. No distress noted. Pleasant and cooperative.  Head:  Normocephalic and atraumatic. Eyes:  Conjuctiva clear without scleral icterus. Mouth:  Oral mucosa pink and moist.  Abdomen:  +BS, soft, non-tender and non-distended. No rebound or guarding. No HSM or masses noted. Msk:  Symmetrical without gross deformities. Normal posture. Extremities:  Without edema. Neurologic:  Alert and  oriented x4 Psych:  Alert and cooperative. Normal mood and affect.  Lab Results  Component Value Date   ALT 27 10/29/2017   AST 16 10/29/2017   ALKPHOS 61 10/29/2017   BILITOT 0.9 10/29/2017

## 2018-02-06 DIAGNOSIS — J441 Chronic obstructive pulmonary disease with (acute) exacerbation: Secondary | ICD-10-CM | POA: Diagnosis not present

## 2018-02-07 LAB — MYCOBACTERIA,CULT W/FLUOROCHROME SMEAR
MICRO NUMBER: 90722534
MICRO NUMBER:: 90722548
SMEAR: NONE SEEN
SMEAR:: NONE SEEN
SPECIMEN QUALITY: ADEQUATE
SPECIMEN QUALITY:: ADEQUATE

## 2018-02-10 NOTE — Assessment & Plan Note (Signed)
62 year old very pleasant male with biopsy-proven NASH. Due for routine [place] now. Known splenomegaly, portal gastropathy on last EGD. Surveillance due in 2020. Will continue to follow every 6 months and arrange EGD at next visit.

## 2018-02-11 ENCOUNTER — Ambulatory Visit (HOSPITAL_COMMUNITY)
Admission: RE | Admit: 2018-02-11 | Discharge: 2018-02-11 | Disposition: A | Discharge status: Home / Self Care | Payer: PPO | Source: Ambulatory Visit | Attending: Gastroenterology | Admitting: Gastroenterology

## 2018-02-11 DIAGNOSIS — K7581 Nonalcoholic steatohepatitis (NASH): Secondary | ICD-10-CM | POA: Diagnosis not present

## 2018-02-12 NOTE — Progress Notes (Signed)
cc'ed to pcp °

## 2018-02-20 NOTE — Progress Notes (Signed)
Mr. Mahabir: US shows fatty liver and no other concerning findings! We will repeat in 6 months. Sent in [place].

## 2018-02-24 NOTE — Progress Notes (Signed)
CC'D TO PCP °

## 2018-03-03 DIAGNOSIS — J441 Chronic obstructive pulmonary disease with (acute) exacerbation: Secondary | ICD-10-CM | POA: Diagnosis not present

## 2018-03-06 DIAGNOSIS — J441 Chronic obstructive pulmonary disease with (acute) exacerbation: Secondary | ICD-10-CM | POA: Diagnosis not present

## 2018-03-09 DIAGNOSIS — J441 Chronic obstructive pulmonary disease with (acute) exacerbation: Secondary | ICD-10-CM | POA: Diagnosis not present

## 2018-03-18 ENCOUNTER — Inpatient Hospital Stay (HOSPITAL_COMMUNITY): Payer: PPO | Attending: Hematology

## 2018-03-18 DIAGNOSIS — K7581 Nonalcoholic steatohepatitis (NASH): Secondary | ICD-10-CM | POA: Insufficient documentation

## 2018-03-18 DIAGNOSIS — I10 Essential (primary) hypertension: Secondary | ICD-10-CM | POA: Insufficient documentation

## 2018-03-18 DIAGNOSIS — E119 Type 2 diabetes mellitus without complications: Secondary | ICD-10-CM | POA: Insufficient documentation

## 2018-03-18 DIAGNOSIS — D72819 Decreased white blood cell count, unspecified: Secondary | ICD-10-CM | POA: Insufficient documentation

## 2018-03-18 DIAGNOSIS — E785 Hyperlipidemia, unspecified: Secondary | ICD-10-CM | POA: Insufficient documentation

## 2018-03-18 DIAGNOSIS — Z79899 Other long term (current) drug therapy: Secondary | ICD-10-CM | POA: Insufficient documentation

## 2018-03-18 DIAGNOSIS — J449 Chronic obstructive pulmonary disease, unspecified: Secondary | ICD-10-CM | POA: Diagnosis not present

## 2018-03-18 DIAGNOSIS — R911 Solitary pulmonary nodule: Secondary | ICD-10-CM | POA: Insufficient documentation

## 2018-03-18 DIAGNOSIS — D696 Thrombocytopenia, unspecified: Secondary | ICD-10-CM | POA: Diagnosis not present

## 2018-03-18 DIAGNOSIS — M5136 Other intervertebral disc degeneration, lumbar region: Secondary | ICD-10-CM | POA: Insufficient documentation

## 2018-03-18 DIAGNOSIS — F418 Other specified anxiety disorders: Secondary | ICD-10-CM | POA: Diagnosis not present

## 2018-03-18 DIAGNOSIS — K219 Gastro-esophageal reflux disease without esophagitis: Secondary | ICD-10-CM | POA: Insufficient documentation

## 2018-03-18 LAB — CBC WITH DIFFERENTIAL/PLATELET
Basophils Absolute: 0 10*3/uL (ref 0.0–0.1)
Basophils Relative: 1 %
Eosinophils Absolute: 0.4 10*3/uL (ref 0.0–0.7)
Eosinophils Relative: 8 %
HCT: 51.2 % (ref 39.0–52.0)
Hemoglobin: 16.7 g/dL (ref 13.0–17.0)
LYMPHS ABS: 1.8 10*3/uL (ref 0.7–4.0)
LYMPHS PCT: 32 %
MCH: 26.8 pg (ref 26.0–34.0)
MCHC: 32.6 g/dL (ref 30.0–36.0)
MCV: 82.3 fL (ref 78.0–100.0)
MONO ABS: 0.5 10*3/uL (ref 0.1–1.0)
Monocytes Relative: 9 %
Neutro Abs: 2.8 10*3/uL (ref 1.7–7.7)
Neutrophils Relative %: 50 %
Platelets: 103 10*3/uL — ABNORMAL LOW (ref 150–400)
RBC: 6.22 MIL/uL — AB (ref 4.22–5.81)
RDW: 14.8 % (ref 11.5–15.5)
WBC: 5.5 10*3/uL (ref 4.0–10.5)

## 2018-03-18 LAB — COMPREHENSIVE METABOLIC PANEL
ALT: 52 U/L — AB (ref 0–44)
AST: 35 U/L (ref 15–41)
Albumin: 4.5 g/dL (ref 3.5–5.0)
Alkaline Phosphatase: 58 U/L (ref 38–126)
Anion gap: 9 (ref 5–15)
BILIRUBIN TOTAL: 0.9 mg/dL (ref 0.3–1.2)
BUN: 20 mg/dL (ref 8–23)
CALCIUM: 9.7 mg/dL (ref 8.9–10.3)
CHLORIDE: 105 mmol/L (ref 98–111)
CO2: 27 mmol/L (ref 22–32)
CREATININE: 1.18 mg/dL (ref 0.61–1.24)
Glucose, Bld: 207 mg/dL — ABNORMAL HIGH (ref 70–99)
Potassium: 4.9 mmol/L (ref 3.5–5.1)
Sodium: 141 mmol/L (ref 135–145)
TOTAL PROTEIN: 8.9 g/dL — AB (ref 6.5–8.1)

## 2018-03-18 LAB — LACTATE DEHYDROGENASE: LDH: 128 U/L (ref 98–192)

## 2018-03-25 ENCOUNTER — Inpatient Hospital Stay (HOSPITAL_BASED_OUTPATIENT_CLINIC_OR_DEPARTMENT_OTHER): Payer: PPO | Admitting: Internal Medicine

## 2018-03-25 ENCOUNTER — Encounter (HOSPITAL_COMMUNITY): Payer: Self-pay | Admitting: Internal Medicine

## 2018-03-25 VITALS — BP 107/65 | HR 96 | Temp 97.7°F | Resp 16 | Wt 161.0 lb

## 2018-03-25 DIAGNOSIS — M5136 Other intervertebral disc degeneration, lumbar region: Secondary | ICD-10-CM

## 2018-03-25 DIAGNOSIS — Z79899 Other long term (current) drug therapy: Secondary | ICD-10-CM

## 2018-03-25 DIAGNOSIS — I10 Essential (primary) hypertension: Secondary | ICD-10-CM | POA: Diagnosis not present

## 2018-03-25 DIAGNOSIS — K219 Gastro-esophageal reflux disease without esophagitis: Secondary | ICD-10-CM

## 2018-03-25 DIAGNOSIS — F418 Other specified anxiety disorders: Secondary | ICD-10-CM

## 2018-03-25 DIAGNOSIS — D696 Thrombocytopenia, unspecified: Secondary | ICD-10-CM | POA: Diagnosis not present

## 2018-03-25 DIAGNOSIS — R911 Solitary pulmonary nodule: Secondary | ICD-10-CM

## 2018-03-25 DIAGNOSIS — E785 Hyperlipidemia, unspecified: Secondary | ICD-10-CM | POA: Diagnosis not present

## 2018-03-25 DIAGNOSIS — E119 Type 2 diabetes mellitus without complications: Secondary | ICD-10-CM

## 2018-03-25 DIAGNOSIS — D72819 Decreased white blood cell count, unspecified: Secondary | ICD-10-CM | POA: Diagnosis not present

## 2018-03-25 DIAGNOSIS — J449 Chronic obstructive pulmonary disease, unspecified: Secondary | ICD-10-CM | POA: Diagnosis not present

## 2018-03-25 DIAGNOSIS — K7581 Nonalcoholic steatohepatitis (NASH): Secondary | ICD-10-CM | POA: Diagnosis not present

## 2018-03-25 NOTE — Progress Notes (Signed)
Diagnosis Thrombocytopenia ([place]) - Plan: CBC with Differential/Platelet, Comprehensive metabolic panel, Lactate dehydrogenase  Staging Cancer Staging No matching staging information was found for the patient.  Assessment and Plan:  1.  Thrombocytopenia ([place]):  Labs done 08/20/2017 showed plts 97,000.  He has reported history of splenomegaly and fatty liver.    Labs done 03/18/2018 reviewed and showed WBC 5.5 HB 16.7 plts 103,000.  Chemistries WNL with K+ 4.9 cr 1.18 and elevated ALT of 52.    Plt count remains stable at 103,000.  Suspect related to chronic infection history.  Pt had normal coagulation studies.    Pet scan done 08/26/2017 showed Fatty liver and normal spleen.  No adenopathy  Will continue to monitor labs and he will RTC in 09/2017 for follow-up and repeat labs.  If counts worsen, will recommended bone marrow biopsy.    2.  Pulmonary nodule.  This was noted on CT chest done for lung cancer screening.   PET scan done 08/26/2017 showed    IMPRESSION: Suspected sequela of chronic atypical mycobacterial infection, as described above. Prior dominant nodular opacity at the posterior left lung base has improved from recent prior CT. There were No findings suspicious for malignancy.  Pt should continue lung cancer screening program as recommended.  Follow-up with Dr. [name] as recommended.   3. Leukopenia.  Labs done 03/18/2018 reviewed and showed WBC 5.5 which was WNL.  Will repeat labs in 09/2018.    4.  Fatty liver.  Labs done 03/18/2018 showed mildly elevated ALT of 52.  Pt had evidence of fatty liver on PET scan done 08/26/2017.  Will repeat labs on RTC in 09/2018.    Current Status:  Pt is seen today for followup.  He is here to go over labs.     Problem List Patient Active Problem List   Diagnosis Date Noted  . Mycobacterium avium infection ([place]) [A31.0] 12/18/2017  . Fever, unknown origin [R50.9] 10/25/2017  . Goiter [E04.9] 10/25/2017  . Suspected Mycobacterial  infectious disease [A31.9] 10/25/2017  . COPD exacerbation ([place]) [J44.1] 10/19/2017  . Dehydration [E86.0] 10/19/2017  . Febrile illness, acute [R50.9] 10/17/2017  . NASH (nonalcoholic steatohepatitis) [K75.81] 08/05/2017  . Liver fibrosis [K74.0] 01/31/2017  . Hx of adenomatous colonic polyps [Z86.010] 07/05/2016  . Hyperproteinemia [E88.09] 09/05/2014  . HTN (hypertension) [I10] 10/14/2013  . Dyslipidemia [E78.5] 10/14/2013  . Type 2 diabetes mellitus without complication, without long-term current use of insulin ([place]) [E11.9] 10/14/2013  . Leukopenia [D72.819] 08/08/2013  . Thrombocytopenia ([place]) [D69.6] 08/08/2013  . Lower abdominal pain [R10.30] 07/06/2013  . Abnormal LFTs [R94.5] 07/06/2013  . GERD (gastroesophageal reflux disease) [K21.9] 07/06/2013  . Unspecified constipation [K59.00] 07/06/2013  . Abdominal bloating [R14.0] 04/21/2013  . Abdominal pain, generalized [R10.84] 04/21/2013  . Non-alcoholic fatty liver disease [K76.0] 04/21/2013  . Splenomegaly [R16.1] 04/21/2013  . Lumbago [M54.5] 11/26/2011  . Muscle weakness (generalized) [M62.81] 11/26/2011  . Bilateral leg pain [M79.604, M79.605] 11/06/2011  . Back pain with radiation [M54.9] 11/06/2011  . DDD (degenerative disc disease), lumbar [M51.36] 11/06/2011    Past Medical History Past Medical History:  Diagnosis Date  . Anxiety   . Asthma   . Chest pain    a. 2003 Cath: nl cors.  [name] COPD (chronic obstructive pulmonary disease) ([place])   . Depression   . Diabetes mellitus   . Dyslipidemia   . Dysrhythmia   . Fracture 12/15   lower back "L-1"  . GERD (gastroesophageal reflux disease)   . Hypertension   .  PONV (postoperative nausea and vomiting)   . Splenomegaly     Past Surgical History Past Surgical History:  Procedure Laterality Date  . BIOPSY  07/26/2016   Procedure: BIOPSY;  Surgeon: [name], MD;  [facility];  Service: Endoscopy;;  gastric  . CARDIAC CATHETERIZATION  10/22/2001    normal coronary arteriea (Dr. Domenic Moras)  . COLONOSCOPY  08/31/2011   DQQ:IWLNLGXQ rectal and colon polyps-treated. Single tubular adenoma. Next TCS 08/2016  . COLONOSCOPY N/A 07/26/2016   Dr. [name]: diverticulosis in sigmoid and descending colon. 6 mm benign splenic flexure. surveillance 5 yeras  . ESOPHAGOGASTRODUODENOSCOPY N/A 07/26/2016   Dr. [name]: normal esophagus, portal gastropathy, chronic gastritis, normal duodenum, screening EGD 2 years   . ESOPHAGOGASTRODUODENOSCOPY (EGD) WITH ESOPHAGEAL DILATION N/A 05/15/2013   [facility]:ERDEYC esophagus-status post Venia Minks dilation/Portal gastropathy. Antral erosions-status post biopsy. extrinsic compression along lesser curvature likely secondary to splenomegaly. gastric bx benign  . HERNIA REPAIR  03/2009  . POLYPECTOMY  07/26/2016   Procedure: POLYPECTOMY;  Surgeon: [name], MD;  Location: [facility];  Service: Endoscopy;;  colon  . TRANSTHORACIC ECHOCARDIOGRAM  2011   EF 14-48%, stage 1 diastolic dysfunction, trace TR & pulm valve regurg   . VASECTOMY  1989    Family History Family History  Problem Relation Age of Onset  . Heart disease Mother   . Cancer Mother        lymph nodes  . Diabetes Mother   . Arthritis Unknown   . Lung disease Unknown   . Asthma Unknown   . Kidney disease Unknown   . Ovarian cancer Sister   . Diabetes Brother   . Heart disease Brother   . Hypertension Brother   . Diabetes Sister   . Diabetes Sister   . Cirrhosis Sister 54       no etoh  . Colon cancer Neg Hx      Social History  reports that he quit smoking about 6 years ago. His smoking use included cigarettes. He has a 45.00 pack-year smoking history. He has quit using smokeless tobacco. He reports that he does not drink alcohol or use drugs.  Medications  Current Outpatient Medications:  .  acetaminophen (TYLENOL) 500 MG tablet, Take 1,000 mg by mouth daily as needed for headache., Disp: , Rfl:  .  albuterol (PROVENTIL) (2.5 MG/3ML) 0.083%  nebulizer solution, Take 3 mLs (2.5 mg total) by nebulization 3 (three) times daily., Disp: 75 mL, Rfl: 12 .  albuterol (PROVENTIL) (2.5 MG/3ML) 0.083% nebulizer solution, Take 3 mLs (2.5 mg total) by nebulization every 2 (two) hours as needed for wheezing., Disp: 75 mL, Rfl: 12 .  ALPRAZolam (XANAX) 0.25 MG tablet, Take 0.25 mg by mouth 2 (two) times daily. , Disp: , Rfl:  .  aspirin EC 81 MG tablet, Take 81 mg by mouth at bedtime. , Disp: , Rfl:  .  atorvastatin (LIPITOR) 40 MG tablet, Take 1 tablet (40 mg total) by mouth daily., Disp: 90 tablet, Rfl: 3 .  Choline Fenofibrate (FENOFIBRIC ACID) 135 MG CPDR, Take 1 capsule by mouth daily., Disp: 90 capsule, Rfl: 3 .  clotrimazole-betamethasone (LOTRISONE) cream, Apply 1 application topically 2 (two) times daily as needed (psoriasis)., Disp: , Rfl:  .  diltiazem (CARDIZEM CD) 240 MG 24 hr capsule, Take 1 capsule (240 mg total) by mouth daily., Disp: 90 capsule, Rfl: 3 .  fluticasone (FLONASE) 50 MCG/ACT nasal spray, Place 2 sprays into both nostrils daily as needed for  allergies. , Disp: , Rfl:  .  gabapentin (NEURONTIN) 300 MG capsule, Take 1 capsule by mouth at bedtime., Disp: , Rfl: 5 .  HYDROcodone-acetaminophen (NORCO/VICODIN) 5-325 MG per tablet, Take 1 tablet by mouth every 8 (eight) hours as needed for moderate pain. (Patient taking differently: Take 1 tablet by mouth daily as needed for moderate pain. ), Disp: 90 tablet, Rfl: 0 .  JARDIANCE 25 MG TABS tablet, Take 25 mg by mouth every evening. , Disp: , Rfl: 3 .  Multiple Vitamin (MULITIVITAMIN WITH MINERALS) TABS, Take 1 tablet by mouth at bedtime. , Disp: , Rfl:  .  pantoprazole (PROTONIX) 40 MG tablet, Take 1 tablet (40 mg total) by mouth daily., Disp: 90 tablet, Rfl: 3 .  PROAIR HFA 108 (90 Base) MCG/ACT inhaler, Inhale 2 puffs into the lungs every 4 (four) hours as needed for wheezing or shortness of breath. , Disp: , Rfl: 12 .  SitaGLIPtin-MetFORMIN HCl (JANUMET XR) [phone] MG TB24,  Take 1 tablet by mouth daily., Disp: , Rfl:  .  tamsulosin (FLOMAX) 0.4 MG CAPS capsule, Take 0.4 mg by mouth daily., Disp: , Rfl:  .  tiotropium (SPIRIVA) 18 MCG inhalation capsule, Place 18 mcg into inhaler and inhale daily., Disp: , Rfl:   Allergies Baclofen and Simvastatin  Review of Systems Review of Systems - Oncology ROS negative   Physical Exam  Vitals Wt Readings from Last 3 Encounters:  03/25/18 161 lb (73 kg)  02/04/18 160 lb 3.2 oz (72.7 kg)  01/15/18 162 lb (73.5 kg)   Temp Readings from Last 3 Encounters:  03/25/18 97.7 F (36.5 C) (Oral)  02/04/18 (!) 97.2 F (36.2 C) (Oral)  01/15/18 97.8 F (36.6 C) (Oral)   BP Readings from Last 3 Encounters:  03/25/18 107/65  02/04/18 119/69  01/15/18 107/64   Pulse Readings from Last 3 Encounters:  03/25/18 96  02/04/18 (!) 109  01/15/18 97    Constitutional: Well-developed, well-nourished, and in no distress.   HENT: Head: Normocephalic and atraumatic.  Mouth/Throat: No oropharyngeal exudate. Mucosa moist. Eyes: Pupils are equal, round, and reactive to light. Conjunctivae are normal. No scleral icterus.  Neck: Normal range of motion. Neck supple. No JVD present.  Cardiovascular: Normal rate, regular rhythm and normal heart sounds.  Exam reveals no gallop and no friction rub.   No murmur heard. Pulmonary/Chest: Effort normal and breath sounds normal. No respiratory distress. No wheezes.No rales.  Abdominal: Soft. Bowel sounds are normal. No distension. There is no tenderness. There is no guarding.  Musculoskeletal: No edema or tenderness.  Lymphadenopathy: No cervical, axillary or supraclavicular adenopathy.  Neurological: Alert and oriented to person, place, and time. No cranial nerve deficit.  Skin: Skin is warm and dry. No rash noted. No erythema. No pallor.  Psychiatric: Affect and judgment normal.   Labs No visits with results within 3 Day(s) from this visit.  Latest known visit with results is:   Appointment on 03/18/2018  Component Date Value Ref Range Status  . WBC 03/18/2018 5.5  4.0 - 10.5 K/uL Final  . RBC 03/18/2018 6.22* 4.22 - 5.81 MIL/uL Final  . Hemoglobin 03/18/2018 16.7  13.0 - 17.0 g/dL Final  . HCT 03/18/2018 51.2  39.0 - 52.0 % Final  . MCV 03/18/2018 82.3  78.0 - 100.0 fL Final  . MCH 03/18/2018 26.8  26.0 - 34.0 pg Final  . MCHC 03/18/2018 32.6  30.0 - 36.0 g/dL Final  . RDW 03/18/2018 14.8  11.5 - 15.5 % Final  .  Platelets 03/18/2018 103* 150 - 400 K/uL Final   Comment: CONSISTENT WITH PREVIOUS RESULT SPECIMEN CHECKED FOR CLOTS   . Neutrophils Relative % 03/18/2018 50  % Final  . Neutro Abs 03/18/2018 2.8  1.7 - 7.7 K/uL Final  . Lymphocytes Relative 03/18/2018 32  % Final  . Lymphs Abs 03/18/2018 1.8  0.7 - 4.0 K/uL Final  . Monocytes Relative 03/18/2018 9  % Final  . Monocytes Absolute 03/18/2018 0.5  0.1 - 1.0 K/uL Final  . Eosinophils Relative 03/18/2018 8  % Final  . Eosinophils Absolute 03/18/2018 0.4  0.0 - 0.7 K/uL Final  . Basophils Relative 03/18/2018 1  % Final  . Basophils Absolute 03/18/2018 0.0  0.0 - 0.1 K/uL Final   Performed at [facility], [address], [place], [place] [address]  . Sodium 03/18/2018 141  135 - 145 mmol/L Final  . Potassium 03/18/2018 4.9  3.5 - 5.1 mmol/L Final  . Chloride 03/18/2018 105  98 - 111 mmol/L Final  . CO2 03/18/2018 27  22 - 32 mmol/L Final  . Glucose, Bld 03/18/2018 207* 70 - 99 mg/dL Final  . BUN 03/18/2018 20  8 - 23 mg/dL Final  . Creatinine, Ser 03/18/2018 1.18  0.61 - 1.24 mg/dL Final  . Calcium 03/18/2018 9.7  8.9 - 10.3 mg/dL Final  . Total Protein 03/18/2018 8.9* 6.5 - 8.1 g/dL Final  . Albumin 03/18/2018 4.5  3.5 - 5.0 g/dL Final  . AST 03/18/2018 35  15 - 41 U/L Final  . ALT 03/18/2018 52* 0 - 44 U/L Final  . Alkaline Phosphatase 03/18/2018 58  38 - 126 U/L Final  . Total Bilirubin 03/18/2018 0.9  0.3 - 1.2 mg/dL Final  . GFR calc non Af Amer 03/18/2018 >60  >60 mL/min Final  . GFR calc Af  Amer 03/18/2018 >60  >60 mL/min Final   Comment: (NOTE) The eGFR has been calculated using the CKD EPI equation. This calculation has not been validated in all clinical situations. eGFR's persistently <60 mL/min signify possible Chronic Kidney Disease.   Georgiann Hahn gap 03/18/2018 9  5 - 15 Final   Performed at [facility], [address], [place], [place] [address]  . LDH 03/18/2018 128  98 - 192 U/L Final   Performed at [facility], [address] [place], [place] [address]     Pathology Orders Placed This Encounter  Procedures  . CBC with Differential/Platelet    Standing Status:   Future    Standing Expiration Date:   03/25/2020  . Comprehensive metabolic panel    Standing Status:   Future    Standing Expiration Date:   03/25/2020  . Lactate dehydrogenase    Standing Status:   Future    Standing Expiration Date:   03/25/2020       Zoila Shutter MD

## 2018-03-25 NOTE — Patient Instructions (Addendum)
[place] at [facility] Discharge Instructions   You were seen today by Dr. Zoila Shutter. She went over your recent labs and they looked normal. Continue to follow up with your lung doctor.  We will see you back in March for labs and follow up.    Thank you for choosing [place] at [facility] to provide your oncology and hematology care.  To afford each patient quality time with our provider, please arrive at least 15 minutes before your scheduled appointment time.    If you have a lab appointment with the [facility] please come in thru the  Main Entrance and check in at the main information desk  You need to re-schedule your appointment should you arrive 10 or more minutes late.  We strive to give you quality time with our providers, and arriving late affects you and other patients whose appointments are after yours.  Also, if you no show three or more times for appointments you may be dismissed from the clinic at the providers discretion.     Again, thank you for choosing [facility].  Our hope is that these requests will decrease the amount of time that you wait before being seen by our physicians.       _____________________________________________________________  Should you have questions after your visit to [facility], please contact our office at [phone] between the hours of 8:30 a.m. and 4:30 p.m.  Voicemails left after 4:30 p.m. will not be returned until the following business day.  For prescription refill requests, have your pharmacy contact our office.       Resources For Cancer Patients and their Caregivers ? American Cancer Society: Can assist with transportation, wigs, general needs, runs Look Good Feel Better.        602-683-3111 ? Cancer Care: Provides financial assistance, online support groups, medication/co-pay assistance.  1-800-813-HOPE 743-804-7763) ? [employer] Assists [place] cancer patients and their families through emotional , educational and financial support.  540-077-1172 ? [place] DSS Where to apply for food stamps, Medicaid and utility assistance. (220)793-5538 ? [facility]: Transportation to medical appointments. (848) 646-4739 ? Social Security Administration: May apply for disability if have a Stage IV cancer. 979-221-0858 (442) 080-4201 ? [employer], Disability and Transit Services: Assists with nutrition, care and transit needs. [place] Support Programs:   > Cancer Support Group  2nd Tuesday of the month 1pm-2pm, Journey Room   > Creative Journey  3rd Tuesday of the month 1130am-1pm, Journey Room

## 2018-04-03 DIAGNOSIS — J441 Chronic obstructive pulmonary disease with (acute) exacerbation: Secondary | ICD-10-CM | POA: Diagnosis not present

## 2018-04-06 DIAGNOSIS — J441 Chronic obstructive pulmonary disease with (acute) exacerbation: Secondary | ICD-10-CM | POA: Diagnosis not present

## 2018-04-21 DIAGNOSIS — Z23 Encounter for immunization: Secondary | ICD-10-CM | POA: Diagnosis not present

## 2018-04-21 DIAGNOSIS — Z0000 Encounter for general adult medical examination without abnormal findings: Secondary | ICD-10-CM | POA: Diagnosis not present

## 2018-04-23 DIAGNOSIS — E1140 Type 2 diabetes mellitus with diabetic neuropathy, unspecified: Secondary | ICD-10-CM | POA: Diagnosis not present

## 2018-04-23 DIAGNOSIS — J449 Chronic obstructive pulmonary disease, unspecified: Secondary | ICD-10-CM | POA: Diagnosis not present

## 2018-04-23 DIAGNOSIS — R600 Localized edema: Secondary | ICD-10-CM | POA: Diagnosis not present

## 2018-04-23 DIAGNOSIS — D696 Thrombocytopenia, unspecified: Secondary | ICD-10-CM | POA: Diagnosis not present

## 2018-04-23 DIAGNOSIS — Z0000 Encounter for general adult medical examination without abnormal findings: Secondary | ICD-10-CM | POA: Diagnosis not present

## 2018-04-23 DIAGNOSIS — F419 Anxiety disorder, unspecified: Secondary | ICD-10-CM | POA: Diagnosis not present

## 2018-04-23 DIAGNOSIS — M545 Low back pain: Secondary | ICD-10-CM | POA: Diagnosis not present

## 2018-04-23 DIAGNOSIS — E785 Hyperlipidemia, unspecified: Secondary | ICD-10-CM | POA: Diagnosis not present

## 2018-04-23 DIAGNOSIS — I10 Essential (primary) hypertension: Secondary | ICD-10-CM | POA: Diagnosis not present

## 2018-04-23 DIAGNOSIS — E1165 Type 2 diabetes mellitus with hyperglycemia: Secondary | ICD-10-CM | POA: Diagnosis not present

## 2018-04-23 DIAGNOSIS — I872 Venous insufficiency (chronic) (peripheral): Secondary | ICD-10-CM | POA: Diagnosis not present

## 2018-04-23 DIAGNOSIS — N401 Enlarged prostate with lower urinary tract symptoms: Secondary | ICD-10-CM | POA: Diagnosis not present

## 2018-05-03 DIAGNOSIS — J441 Chronic obstructive pulmonary disease with (acute) exacerbation: Secondary | ICD-10-CM | POA: Diagnosis not present

## 2018-05-06 DIAGNOSIS — J441 Chronic obstructive pulmonary disease with (acute) exacerbation: Secondary | ICD-10-CM | POA: Diagnosis not present

## 2018-05-07 DIAGNOSIS — Z1211 Encounter for screening for malignant neoplasm of colon: Secondary | ICD-10-CM | POA: Diagnosis not present

## 2018-05-11 ENCOUNTER — Other Ambulatory Visit: Payer: Self-pay | Admitting: Internal Medicine

## 2018-05-21 ENCOUNTER — Ambulatory Visit (HOSPITAL_COMMUNITY)
Admission: RE | Admit: 2018-05-21 | Discharge: 2018-05-21 | Disposition: A | Discharge status: Home / Self Care | Payer: PPO | Source: Ambulatory Visit | Attending: Pulmonary Disease | Admitting: Pulmonary Disease

## 2018-05-21 ENCOUNTER — Other Ambulatory Visit (HOSPITAL_COMMUNITY): Payer: Self-pay | Admitting: Pulmonary Disease

## 2018-05-21 DIAGNOSIS — R05 Cough: Secondary | ICD-10-CM | POA: Diagnosis not present

## 2018-05-21 DIAGNOSIS — I700 Atherosclerosis of aorta: Secondary | ICD-10-CM | POA: Insufficient documentation

## 2018-05-21 DIAGNOSIS — E1165 Type 2 diabetes mellitus with hyperglycemia: Secondary | ICD-10-CM | POA: Diagnosis not present

## 2018-05-21 DIAGNOSIS — J441 Chronic obstructive pulmonary disease with (acute) exacerbation: Secondary | ICD-10-CM

## 2018-05-21 DIAGNOSIS — J9611 Chronic respiratory failure with hypoxia: Secondary | ICD-10-CM | POA: Diagnosis not present

## 2018-05-21 DIAGNOSIS — I10 Essential (primary) hypertension: Secondary | ICD-10-CM | POA: Diagnosis not present

## 2018-05-22 DIAGNOSIS — I10 Essential (primary) hypertension: Secondary | ICD-10-CM | POA: Diagnosis not present

## 2018-05-22 DIAGNOSIS — F419 Anxiety disorder, unspecified: Secondary | ICD-10-CM | POA: Diagnosis not present

## 2018-05-22 DIAGNOSIS — E1165 Type 2 diabetes mellitus with hyperglycemia: Secondary | ICD-10-CM | POA: Diagnosis not present

## 2018-05-22 DIAGNOSIS — J441 Chronic obstructive pulmonary disease with (acute) exacerbation: Secondary | ICD-10-CM | POA: Diagnosis not present

## 2018-05-26 DIAGNOSIS — I10 Essential (primary) hypertension: Secondary | ICD-10-CM | POA: Diagnosis not present

## 2018-05-26 DIAGNOSIS — F419 Anxiety disorder, unspecified: Secondary | ICD-10-CM | POA: Diagnosis not present

## 2018-05-26 DIAGNOSIS — E1165 Type 2 diabetes mellitus with hyperglycemia: Secondary | ICD-10-CM | POA: Diagnosis not present

## 2018-05-26 DIAGNOSIS — J441 Chronic obstructive pulmonary disease with (acute) exacerbation: Secondary | ICD-10-CM | POA: Diagnosis not present

## 2018-06-03 DIAGNOSIS — J441 Chronic obstructive pulmonary disease with (acute) exacerbation: Secondary | ICD-10-CM | POA: Diagnosis not present

## 2018-06-06 DIAGNOSIS — J441 Chronic obstructive pulmonary disease with (acute) exacerbation: Secondary | ICD-10-CM | POA: Diagnosis not present

## 2018-06-23 DIAGNOSIS — K219 Gastro-esophageal reflux disease without esophagitis: Secondary | ICD-10-CM | POA: Diagnosis not present

## 2018-06-23 DIAGNOSIS — J441 Chronic obstructive pulmonary disease with (acute) exacerbation: Secondary | ICD-10-CM | POA: Diagnosis not present

## 2018-06-23 DIAGNOSIS — I10 Essential (primary) hypertension: Secondary | ICD-10-CM | POA: Diagnosis not present

## 2018-06-23 DIAGNOSIS — E1165 Type 2 diabetes mellitus with hyperglycemia: Secondary | ICD-10-CM | POA: Diagnosis not present

## 2018-06-24 ENCOUNTER — Other Ambulatory Visit: Payer: Self-pay | Admitting: Internal Medicine

## 2018-06-26 ENCOUNTER — Encounter: Payer: Self-pay | Admitting: Internal Medicine

## 2018-06-27 ENCOUNTER — Other Ambulatory Visit: Payer: Self-pay | Admitting: Internal Medicine

## 2018-07-03 DIAGNOSIS — J441 Chronic obstructive pulmonary disease with (acute) exacerbation: Secondary | ICD-10-CM | POA: Diagnosis not present

## 2018-07-06 DIAGNOSIS — J441 Chronic obstructive pulmonary disease with (acute) exacerbation: Secondary | ICD-10-CM | POA: Diagnosis not present

## 2018-07-08 ENCOUNTER — Other Ambulatory Visit: Payer: Self-pay | Admitting: Internal Medicine

## 2018-07-14 DIAGNOSIS — L500 Allergic urticaria: Secondary | ICD-10-CM | POA: Diagnosis not present

## 2018-07-14 DIAGNOSIS — J441 Chronic obstructive pulmonary disease with (acute) exacerbation: Secondary | ICD-10-CM | POA: Diagnosis not present

## 2018-07-14 DIAGNOSIS — L509 Urticaria, unspecified: Secondary | ICD-10-CM | POA: Diagnosis not present

## 2018-07-14 DIAGNOSIS — E1165 Type 2 diabetes mellitus with hyperglycemia: Secondary | ICD-10-CM | POA: Diagnosis not present

## 2018-07-14 DIAGNOSIS — I10 Essential (primary) hypertension: Secondary | ICD-10-CM | POA: Diagnosis not present

## 2018-07-22 ENCOUNTER — Other Ambulatory Visit: Payer: Self-pay | Admitting: Internal Medicine

## 2018-07-24 DIAGNOSIS — E119 Type 2 diabetes mellitus without complications: Secondary | ICD-10-CM | POA: Diagnosis not present

## 2018-07-24 DIAGNOSIS — E785 Hyperlipidemia, unspecified: Secondary | ICD-10-CM | POA: Diagnosis not present

## 2018-07-24 DIAGNOSIS — I10 Essential (primary) hypertension: Secondary | ICD-10-CM | POA: Diagnosis not present

## 2018-07-24 DIAGNOSIS — J441 Chronic obstructive pulmonary disease with (acute) exacerbation: Secondary | ICD-10-CM | POA: Diagnosis not present

## 2018-07-28 ENCOUNTER — Telehealth: Payer: Self-pay | Admitting: Internal Medicine

## 2018-07-28 NOTE — Telephone Encounter (Signed)
Letter mailed

## 2018-07-28 NOTE — Telephone Encounter (Signed)
RECALL FOR ULTRASOUND 

## 2018-07-29 ENCOUNTER — Other Ambulatory Visit: Payer: Self-pay

## 2018-07-29 ENCOUNTER — Emergency Department (HOSPITAL_COMMUNITY): Payer: PPO

## 2018-07-29 ENCOUNTER — Encounter (HOSPITAL_COMMUNITY): Payer: Self-pay | Admitting: Emergency Medicine

## 2018-07-29 ENCOUNTER — Inpatient Hospital Stay (HOSPITAL_COMMUNITY)
Admission: EM | Admit: 2018-07-29 | Discharge: 2018-07-31 | DRG: 916 | Disposition: A | Discharge status: Home / Self Care | Payer: PPO | Attending: Pulmonary Disease | Admitting: Pulmonary Disease

## 2018-07-29 DIAGNOSIS — J449 Chronic obstructive pulmonary disease, unspecified: Secondary | ICD-10-CM | POA: Diagnosis not present

## 2018-07-29 DIAGNOSIS — E785 Hyperlipidemia, unspecified: Secondary | ICD-10-CM | POA: Diagnosis not present

## 2018-07-29 DIAGNOSIS — Z888 Allergy status to other drugs, medicaments and biological substances status: Secondary | ICD-10-CM

## 2018-07-29 DIAGNOSIS — Z7951 Long term (current) use of inhaled steroids: Secondary | ICD-10-CM | POA: Diagnosis not present

## 2018-07-29 DIAGNOSIS — D696 Thrombocytopenia, unspecified: Secondary | ICD-10-CM | POA: Diagnosis not present

## 2018-07-29 DIAGNOSIS — J984 Other disorders of lung: Secondary | ICD-10-CM | POA: Diagnosis not present

## 2018-07-29 DIAGNOSIS — Z7982 Long term (current) use of aspirin: Secondary | ICD-10-CM

## 2018-07-29 DIAGNOSIS — Z7984 Long term (current) use of oral hypoglycemic drugs: Secondary | ICD-10-CM

## 2018-07-29 DIAGNOSIS — R21 Rash and other nonspecific skin eruption: Secondary | ICD-10-CM | POA: Diagnosis not present

## 2018-07-29 DIAGNOSIS — F329 Major depressive disorder, single episode, unspecified: Secondary | ICD-10-CM | POA: Diagnosis not present

## 2018-07-29 DIAGNOSIS — K7581 Nonalcoholic steatohepatitis (NASH): Secondary | ICD-10-CM | POA: Diagnosis not present

## 2018-07-29 DIAGNOSIS — F419 Anxiety disorder, unspecified: Secondary | ICD-10-CM | POA: Diagnosis present

## 2018-07-29 DIAGNOSIS — T783XXD Angioneurotic edema, subsequent encounter: Secondary | ICD-10-CM | POA: Diagnosis not present

## 2018-07-29 DIAGNOSIS — Z87891 Personal history of nicotine dependence: Secondary | ICD-10-CM | POA: Diagnosis not present

## 2018-07-29 DIAGNOSIS — I10 Essential (primary) hypertension: Secondary | ICD-10-CM | POA: Diagnosis not present

## 2018-07-29 DIAGNOSIS — K219 Gastro-esophageal reflux disease without esophagitis: Secondary | ICD-10-CM | POA: Diagnosis not present

## 2018-07-29 DIAGNOSIS — Z79899 Other long term (current) drug therapy: Secondary | ICD-10-CM

## 2018-07-29 DIAGNOSIS — E119 Type 2 diabetes mellitus without complications: Secondary | ICD-10-CM

## 2018-07-29 DIAGNOSIS — L509 Urticaria, unspecified: Secondary | ICD-10-CM | POA: Diagnosis not present

## 2018-07-29 DIAGNOSIS — T783XXA Angioneurotic edema, initial encounter: Principal | ICD-10-CM | POA: Diagnosis present

## 2018-07-29 DIAGNOSIS — R233 Spontaneous ecchymoses: Secondary | ICD-10-CM

## 2018-07-29 LAB — GLUCOSE, CAPILLARY: Glucose-Capillary: 269 mg/dL — ABNORMAL HIGH (ref 70–99)

## 2018-07-29 LAB — CBC WITH DIFFERENTIAL/PLATELET
Abs Immature Granulocytes: 0.08 10*3/uL — ABNORMAL HIGH (ref 0.00–0.07)
Basophils Absolute: 0 10*3/uL (ref 0.0–0.1)
Basophils Relative: 0 %
Eosinophils Absolute: 0.1 10*3/uL (ref 0.0–0.5)
Eosinophils Relative: 1 %
HCT: 55.4 % — ABNORMAL HIGH (ref 39.0–52.0)
Hemoglobin: 17.4 g/dL — ABNORMAL HIGH (ref 13.0–17.0)
Immature Granulocytes: 1 %
Lymphocytes Relative: 14 %
Lymphs Abs: 1.2 10*3/uL (ref 0.7–4.0)
MCH: 26.1 pg (ref 26.0–34.0)
MCHC: 31.4 g/dL (ref 30.0–36.0)
MCV: 83.2 fL (ref 80.0–100.0)
MONO ABS: 0.4 10*3/uL (ref 0.1–1.0)
Monocytes Relative: 4 %
NEUTROS ABS: 7.3 10*3/uL (ref 1.7–7.7)
Neutrophils Relative %: 80 %
Platelets: 136 10*3/uL — ABNORMAL LOW (ref 150–400)
RBC: 6.66 MIL/uL — ABNORMAL HIGH (ref 4.22–5.81)
RDW: 17.4 % — ABNORMAL HIGH (ref 11.5–15.5)
WBC: 9.1 10*3/uL (ref 4.0–10.5)
nRBC: 0 % (ref 0.0–0.2)

## 2018-07-29 LAB — URINALYSIS, ROUTINE W REFLEX MICROSCOPIC
BACTERIA UA: NONE SEEN
Bilirubin Urine: NEGATIVE
Glucose, UA: >=500 mg/dL — AB
Hgb urine dipstick: NEGATIVE
Ketones, ur: NEGATIVE mg/dL
Leukocytes, UA: NEGATIVE
Nitrite: NEGATIVE
PROTEIN: NEGATIVE mg/dL
Specific Gravity, Urine: 1.016 (ref 1.005–1.030)
pH: 7 (ref 5.0–8.0)

## 2018-07-29 LAB — BASIC METABOLIC PANEL
Anion gap: 11 (ref 5–15)
BUN: 14 mg/dL (ref 8–23)
CO2: 25 mmol/L (ref 22–32)
Calcium: 9 mg/dL (ref 8.9–10.3)
Chloride: 100 mmol/L (ref 98–111)
Creatinine, Ser: 0.85 mg/dL (ref 0.61–1.24)
GFR calc Af Amer: >60 mL/min (ref >60)
GFR calc non Af Amer: >60 mL/min (ref >60)
Glucose, Bld: 147 mg/dL — ABNORMAL HIGH (ref 70–99)
Potassium: 3.6 mmol/L (ref 3.5–5.1)
Sodium: 136 mmol/L (ref 135–145)

## 2018-07-29 LAB — PROTIME-INR
INR: 0.93
Prothrombin Time: 12.4 seconds (ref 11.4–15.2)

## 2018-07-29 LAB — CBG MONITORING, ED: GLUCOSE-CAPILLARY: 262 mg/dL — AB (ref 70–99)

## 2018-07-29 MED ORDER — EPINEPHRINE 0.3 MG/0.3ML IJ SOAJ
0.3000 mg | Freq: Once | INTRAMUSCULAR | Status: DC
  Filled 2018-07-29: qty 0.3

## 2018-07-29 MED ORDER — FAMOTIDINE IN NACL 20-0.9 MG/50ML-% IV SOLN
20.0000 mg | Freq: Once | INTRAVENOUS | Status: AC
  Administered 2018-07-29: 20 mg via INTRAVENOUS
  Filled 2018-07-29: qty 50

## 2018-07-29 MED ORDER — GABAPENTIN 300 MG PO CAPS
300.0000 mg | ORAL_CAPSULE | Freq: Every day | ORAL | Status: DC
  Administered 2018-07-29 – 2018-07-30 (×2): 300 mg via ORAL
  Filled 2018-07-29 (×2): qty 1

## 2018-07-29 MED ORDER — METHYLPREDNISOLONE SODIUM SUCC 125 MG IJ SOLR
125.0000 mg | Freq: Once | INTRAMUSCULAR | Status: AC
  Administered 2018-07-29: 125 mg via INTRAVENOUS
  Filled 2018-07-29: qty 2

## 2018-07-29 MED ORDER — TIOTROPIUM BROMIDE MONOHYDRATE 18 MCG IN CAPS: 18.0000 ug | ORAL_CAPSULE | Freq: Every day | RESPIRATORY_TRACT | Status: DC

## 2018-07-29 MED ORDER — SERTRALINE HCL 50 MG PO TABS
50.0000 mg | ORAL_TABLET | Freq: Every day | ORAL | Status: DC
  Administered 2018-07-30 – 2018-07-31 (×2): 50 mg via ORAL
  Filled 2018-07-29 (×2): qty 1

## 2018-07-29 MED ORDER — TAMSULOSIN HCL 0.4 MG PO CAPS
0.4000 mg | ORAL_CAPSULE | Freq: Every day | ORAL | Status: DC
  Administered 2018-07-29 – 2018-07-31 (×3): 0.4 mg via ORAL
  Filled 2018-07-29 (×3): qty 1

## 2018-07-29 MED ORDER — IPRATROPIUM-ALBUTEROL 0.5-2.5 (3) MG/3ML IN SOLN: 3.0000 mL | RESPIRATORY_TRACT | Status: DC | PRN

## 2018-07-29 MED ORDER — UMECLIDINIUM BROMIDE 62.5 MCG/INH IN AEPB
1.0000 | INHALATION_SPRAY | Freq: Every day | RESPIRATORY_TRACT | Status: DC
  Administered 2018-07-30 – 2018-07-31 (×2): 1 via RESPIRATORY_TRACT
  Filled 2018-07-29: qty 7

## 2018-07-29 MED ORDER — DILTIAZEM HCL ER COATED BEADS 240 MG PO CP24
240.0000 mg | ORAL_CAPSULE | Freq: Every day | ORAL | Status: DC
  Administered 2018-07-30 – 2018-07-31 (×2): 240 mg via ORAL
  Filled 2018-07-29 (×3): qty 1

## 2018-07-29 MED ORDER — ENOXAPARIN SODIUM 40 MG/0.4ML ~~LOC~~ SOLN
40.0000 mg | SUBCUTANEOUS | Status: DC
  Administered 2018-07-29: 40 mg via SUBCUTANEOUS
  Filled 2018-07-29: qty 0.4

## 2018-07-29 MED ORDER — INSULIN ASPART 100 UNIT/ML ~~LOC~~ SOLN
0.0000 [IU] | Freq: Every day | SUBCUTANEOUS | Status: DC
  Administered 2018-07-29: 3 [IU] via SUBCUTANEOUS
  Administered 2018-07-30: 4 [IU] via SUBCUTANEOUS

## 2018-07-29 MED ORDER — SODIUM CHLORIDE 0.9 % IV SOLN
INTRAVENOUS | Status: DC
  Administered 2018-07-29 – 2018-07-31 (×3): via INTRAVENOUS

## 2018-07-29 MED ORDER — LORATADINE 10 MG PO TABS
10.0000 mg | ORAL_TABLET | Freq: Every day | ORAL | Status: DC
  Administered 2018-07-29 – 2018-07-31 (×3): 10 mg via ORAL
  Filled 2018-07-29 (×3): qty 1

## 2018-07-29 MED ORDER — ACETAMINOPHEN 650 MG RE SUPP: 650.0000 mg | Freq: Four times a day (QID) | RECTAL | Status: DC | PRN

## 2018-07-29 MED ORDER — ALPRAZOLAM 0.25 MG PO TABS: 0.2500 mg | ORAL_TABLET | Freq: Two times a day (BID) | ORAL | Status: DC | PRN

## 2018-07-29 MED ORDER — FLUTICASONE PROPIONATE 50 MCG/ACT NA SUSP
2.0000 | Freq: Every day | NASAL | Status: DC | PRN
  Filled 2018-07-29: qty 16

## 2018-07-29 MED ORDER — ASPIRIN EC 81 MG PO TBEC
81.0000 mg | DELAYED_RELEASE_TABLET | Freq: Every day | ORAL | Status: DC
  Administered 2018-07-29 – 2018-07-30 (×2): 81 mg via ORAL
  Filled 2018-07-29 (×3): qty 1

## 2018-07-29 MED ORDER — ONDANSETRON HCL 4 MG/2ML IJ SOLN: 4.0000 mg | Freq: Four times a day (QID) | INTRAMUSCULAR | Status: DC | PRN

## 2018-07-29 MED ORDER — INSULIN ASPART 100 UNIT/ML ~~LOC~~ SOLN
0.0000 [IU] | Freq: Three times a day (TID) | SUBCUTANEOUS | Status: DC
  Administered 2018-07-29: 8 [IU] via SUBCUTANEOUS
  Administered 2018-07-30: 5 [IU] via SUBCUTANEOUS
  Administered 2018-07-30: 8 [IU] via SUBCUTANEOUS
  Administered 2018-07-30: 5 [IU] via SUBCUTANEOUS
  Administered 2018-07-31: 3 [IU] via SUBCUTANEOUS
  Filled 2018-07-29: qty 1

## 2018-07-29 MED ORDER — FAMOTIDINE 20 MG PO TABS
20.0000 mg | ORAL_TABLET | Freq: Two times a day (BID) | ORAL | Status: DC
  Administered 2018-07-29 – 2018-07-31 (×5): 20 mg via ORAL
  Filled 2018-07-29 (×5): qty 1

## 2018-07-29 MED ORDER — SODIUM CHLORIDE 0.9 % IV SOLN
Freq: Once | INTRAVENOUS | Status: AC
  Administered 2018-07-29: 11:00:00 via INTRAVENOUS

## 2018-07-29 MED ORDER — ATORVASTATIN CALCIUM 40 MG PO TABS
40.0000 mg | ORAL_TABLET | Freq: Every day | ORAL | Status: DC
  Administered 2018-07-29 – 2018-07-30 (×2): 40 mg via ORAL
  Filled 2018-07-29 (×3): qty 1

## 2018-07-29 MED ORDER — DIPHENHYDRAMINE HCL 50 MG/ML IJ SOLN
25.0000 mg | Freq: Once | INTRAMUSCULAR | Status: AC
  Administered 2018-07-29: 25 mg via INTRAVENOUS
  Filled 2018-07-29: qty 1

## 2018-07-29 MED ORDER — HYDROCODONE-ACETAMINOPHEN 5-325 MG PO TABS: 1.0000 | ORAL_TABLET | Freq: Three times a day (TID) | ORAL | Status: DC | PRN

## 2018-07-29 MED ORDER — METHYLPREDNISOLONE SODIUM SUCC 125 MG IJ SOLR
60.0000 mg | Freq: Four times a day (QID) | INTRAMUSCULAR | Status: DC
  Administered 2018-07-29 – 2018-07-31 (×8): 60 mg via INTRAVENOUS
  Filled 2018-07-29 (×8): qty 2

## 2018-07-29 MED ORDER — ONDANSETRON HCL 4 MG PO TABS: 4.0000 mg | ORAL_TABLET | Freq: Four times a day (QID) | ORAL | Status: DC | PRN

## 2018-07-29 MED ORDER — SODIUM CHLORIDE 0.9 % IV BOLUS
500.0000 mL | Freq: Once | INTRAVENOUS | Status: AC
  Administered 2018-07-29: 500 mL via INTRAVENOUS

## 2018-07-29 MED ORDER — ACETAMINOPHEN 325 MG PO TABS: 650.0000 mg | ORAL_TABLET | Freq: Four times a day (QID) | ORAL | Status: DC | PRN

## 2018-07-29 NOTE — ED Provider Notes (Signed)
[facility] EMERGENCY DEPARTMENT Provider Note   CSN: [id number] Arrival date & time: 07/29/18  4825     History   Chief Complaint Chief Complaint  Patient presents with  . Urticaria    HPI [name] is a 63 y.o. male with past medical history as outlined below including a history of thrombocytopenia of unclear etiology, followed by hematology here, who has failed outpatient treatment for unexplained hives.  He was treated by his pcp about 2 weeks ago with steroids and benadryl (finished prednisone 5 days ago) but now with recurrence of hives, neck and facial including tongue swelling, scratchy feeling throat along with pressure in his throat.  He reports his tongue swelling was worse yesterday, attempted to see his pcp, but office closed.  Has been taking benadryl with last dose around 1 am today.  He reports generalized itching, redness with hives, no current shortness of breath, coughing or wheezing. He also reports bruising of his forearms and bilateral upper thighs of unclear etiology. Denies injury. Wife endorses occasional bruising of his forearms, but has never noted symptoms on upper legs.  He denies dizziness, does endorse generalized fatigue, no fevers or chills, has had a nonproductive cough which he reports is stable with his copd.  No recognized new exposures, medicines or other triggers for the hives.  Wife reports he was bit by a few fleas recently as their cat had a flea problem which has been treated.   The history is provided by the patient and the spouse.    Past Medical History:  Diagnosis Date  . Anxiety   . Asthma   . Chest pain    a. 2003 Cath: nl cors.  [name] COPD (chronic obstructive pulmonary disease) ([place])   . Depression   . Diabetes mellitus   . Dyslipidemia   . Dysrhythmia   . Fracture 12/15   lower back "L-1"  . GERD (gastroesophageal reflux disease)   . Hypertension   . PONV (postoperative nausea and vomiting)   . Splenomegaly     Patient  Active Problem List   Diagnosis Date Noted  . Mycobacterium avium infection ([place]) 12/18/2017  . Fever, unknown origin 10/25/2017  . Goiter 10/25/2017  . Suspected Mycobacterial infectious disease 10/25/2017  . COPD exacerbation ([place]) 10/19/2017  . Dehydration 10/19/2017  . Febrile illness, acute 10/17/2017  . NASH (nonalcoholic steatohepatitis) 08/05/2017  . Liver fibrosis 01/31/2017  . Hx of adenomatous colonic polyps 07/05/2016  . Hyperproteinemia 09/05/2014  . HTN (hypertension) 10/14/2013  . Dyslipidemia 10/14/2013  . Type 2 diabetes mellitus without complication, without long-term current use of insulin ([place]) 10/14/2013  . Leukopenia 08/08/2013  . Thrombocytopenia ([place]) 08/08/2013  . Lower abdominal pain 07/06/2013  . Abnormal LFTs 07/06/2013  . GERD (gastroesophageal reflux disease) 07/06/2013  . Unspecified constipation 07/06/2013  . Abdominal bloating 04/21/2013  . Abdominal pain, generalized 04/21/2013  . Non-alcoholic fatty liver disease 04/21/2013  . Splenomegaly 04/21/2013  . Lumbago 11/26/2011  . Muscle weakness (generalized) 11/26/2011  . Bilateral leg pain 11/06/2011  . Back pain with radiation 11/06/2011  . DDD (degenerative disc disease), lumbar 11/06/2011    Past Surgical History:  Procedure Laterality Date  . BIOPSY  07/26/2016   Procedure: BIOPSY;  Surgeon: [name], MD;  Location: [facility]O SU[facility];  Service: Endoscopy;;  gastric  . CARDIAC CATHETERIZATION  10/22/2001   normal coronary arteriea (Dr. Domenic Moras)  . COLONOSCOPY  08/31/2011   [facility]:BCWUGQBV rectal and colon polyps-treated. Single tubular adenoma. Next  TCS 08/2016  . COLONOSCOPY N/A 07/26/2016   Dr. [name]: diverticulosis in sigmoid and descending colon. 6 mm benign splenic flexure. surveillance 5 yeras  . ESOPHAGOGASTRODUODENOSCOPY N/A 07/26/2016   Dr. [name]: normal esophagus, portal gastropathy, chronic gastritis, normal duodenum, screening EGD 2 years   . ESOPHAGOGASTRODUODENOSCOPY (EGD) WITH  ESOPHAGEAL DILATION N/A 05/15/2013   [facility]:NOBSJG esophagus-status post Venia Minks dilation/Portal gastropathy. Antral erosions-status post biopsy. extrinsic compression along lesser curvature likely secondary to splenomegaly. gastric bx benign  . HERNIA REPAIR  03/2009  . POLYPECTOMY  07/26/2016   Procedure: POLYPECTOMY;  Surgeon: [name], MD;  Location: [facility] ENDO SU[facility];  Service: Endoscopy;;  colon  . TRANSTHORACIC ECHOCARDIOGRAM  2011   EF 28-36%, stage 1 diastolic dysfunction, trace TR & pulm valve regurg   . VASECTOMY  1989        Home Medications    Prior to Admission medications   Medication Sig Start Date End Date Taking? Authorizing Provider  acetaminophen (TYLENOL) 500 MG tablet Take 1,000 mg by mouth daily as needed for headache.    [provider]  albuterol (PROVENTIL) (2.5 MG/3ML) 0.083% nebulizer solution Take 3 mLs (2.5 mg total) by nebulization 3 (three) times daily. 11/02/17   Sinda Du, MD  albuterol (PROVENTIL) (2.5 MG/3ML) 0.083% nebulizer solution Take 3 mLs (2.5 mg total) by nebulization every 2 (two) hours as needed for wheezing. 11/02/17   Sinda Du, MD  ALPRAZolam [name]) 0.25 MG tablet Take 0.25 mg by mouth 2 (two) times daily.     [provider]  aspirin EC 81 MG tablet Take 81 mg by mouth at bedtime.     [provider]  atorvastatin (LIPITOR) 40 MG tablet Take 1 tablet (40 mg total) by mouth daily. NEED OV. 06/24/18   Pixie Casino, MD  Choline Fenofibrate (FENOFIBRIC ACID) 135 MG CPDR Take 1 tablet by mouth daily. NEED OV. 07/22/18   Hilty, [name], MD  clotrimazole-betamethasone (LOTRISONE) cream Apply 1 application topically 2 (two) times daily as needed (psoriasis).    [provider]  diltiazem (CARDIZEM CD) 240 MG 24 hr capsule TAKE 1 CAPSULE BY MOUTH EVERY DAY 07/08/18   Hilty, [name], MD  fluticasone (FLONASE) 50 MCG/ACT nasal spray Place 2 sprays into both nostrils daily as needed for allergies.   06/26/16   [provider]  gabapentin (NEURONTIN) 300 MG capsule Take 1 capsule by mouth at bedtime. 07/18/17   [provider]  HYDROcodone-acetaminophen (NORCO/VICODIN) 5-325 MG per tablet Take 1 tablet by mouth every 8 (eight) hours as needed for moderate pain. Patient taking differently: Take 1 tablet by mouth daily as needed for moderate pain.  11/09/14   [name], MD  JARDIANCE 25 MG TABS tablet Take 25 mg by mouth every evening.  01/01/17   [provider]  Multiple Vitamin (MULITIVITAMIN WITH MINERALS) TABS Take 1 tablet by mouth at bedtime.     [provider]  pantoprazole (PROTONIX) 40 MG tablet Take 1 tablet (40 mg total) by mouth daily. 07/31/17   [name], NP  PROAIR HFA 108 [phone] Base) MCG/ACT inhaler Inhale 2 puffs into the lungs every 4 (four) hours as needed for wheezing or shortness of breath.  08/29/15   [provider]  SitaGLIPtin-MetFORMIN HCl (JANUMET XR) [phone] MG TB24 Take 1 tablet by mouth daily.    [provider]  tamsulosin (FLOMAX) 0.4 MG CAPS capsule Take 0.4 mg by mouth daily.    [provider]  tiotropium (SPIRIVA) 18 MCG inhalation capsule Place 18 mcg into inhaler and inhale daily.    [provider]    Family History Family History  Problem Relation Age of Onset  . Heart disease Mother   . Cancer Mother        lymph nodes  . Diabetes Mother   . Arthritis Other   . Lung disease Other   . Asthma Other   . Kidney disease Other   . Ovarian cancer Sister   . Diabetes Brother   . Heart disease Brother   . Hypertension Brother   . Diabetes Sister   . Diabetes Sister   . Cirrhosis Sister 16       no etoh  . Colon cancer Neg Hx     Social History Social History   Tobacco Use  . Smoking status: Former Smoker    Packs/day: 1.25    Years: 36.00    Pack years: 45.00    Types: Cigarettes    Last attempt to quit: 04/2011    Years since quitting: 7.3  . Smokeless  tobacco: Former Systems developer  . Tobacco comment: quit x 7 years  Substance Use Topics  . Alcohol use: No  . Drug use: No     Allergies   Baclofen and Simvastatin   Review of Systems Review of Systems  Constitutional: Negative for chills and fever.  HENT: Positive for facial swelling and sore throat. Negative for congestion, trouble swallowing and voice change.   Eyes: Negative.   Respiratory: Positive for cough. Negative for chest tightness, shortness of breath, wheezing and stridor.   Cardiovascular: Negative for chest pain, palpitations and leg swelling.  Gastrointestinal: Negative for abdominal pain and nausea.  Genitourinary: Negative.   Musculoskeletal: Negative for arthralgias, joint swelling and neck pain.  Skin: Positive for color change and rash. Negative for wound.  Neurological: Negative for dizziness, weakness, light-headedness, numbness and headaches.  Psychiatric/Behavioral: Negative.      Physical Exam Updated Vital Signs BP 103/67   Pulse 86   Temp 97.7 F (36.5 C) (Axillary)   Resp 19   Ht 5' 7"  (1.702 m)   Wt 72.6 kg   SpO2 93%   BMI 25.06 kg/m   Physical Exam Vitals signs and nursing note reviewed.  Constitutional:      Appearance: He is well-developed.  HENT:     Head: Normocephalic and atraumatic.     Mouth/Throat:     Mouth: Mucous membranes are moist.     Pharynx: Oropharynx is clear. No uvula swelling.     Comments: No lip edema,  His tongue is enlarged, but posterior pharynx clearly visualized. Eyes:     Conjunctiva/sclera: Conjunctivae normal.  Neck:     Musculoskeletal: Normal range of motion.  Cardiovascular:     Rate and Rhythm: Normal rate and regular rhythm.     Heart sounds: Normal heart sounds.  Pulmonary:     Effort: Pulmonary effort is normal.     Breath sounds: Normal breath sounds. No wheezing.  Abdominal:     General: Bowel sounds are normal.     Palpations: Abdomen is soft.     Tenderness: There is no abdominal tenderness.   Musculoskeletal: Normal range of motion.  Skin:    General: Skin is warm and dry.     Comments: Large areas of petechial nonblanching macules on bilateral upper thighs, lesser extent on dorsal forearms.  Facial flushing.  Urticarial lesions, predominantly on trunk.  Neurological:  Mental Status: He is alert.      ED Treatments / Results  Labs (all labs ordered are listed, but only abnormal results are displayed) Labs Reviewed  CBC WITH DIFFERENTIAL/PLATELET - Abnormal; Notable for the following components:      Result Value   RBC 6.66 (*)    Hemoglobin 17.4 (*)    HCT 55.4 (*)    RDW 17.4 (*)    Platelets 136 (*)    Abs Immature Granulocytes 0.08 (*)    All other components within normal limits  BASIC METABOLIC PANEL - Abnormal; Notable for the following components:   Glucose, Bld 147 (*)    All other components within normal limits  URINALYSIS, ROUTINE W REFLEX MICROSCOPIC - Abnormal; Notable for the following components:   Glucose, UA >=500 (*)    All other components within normal limits  PROTIME-INR    EKG EKG Interpretation  Date/Time:  Tuesday July 29 2018 09:33:20 EST Ventricular Rate:  79 PR Interval:    QRS Duration: 88 QT Interval:  375 QTC Calculation: 430 R Axis:   75 Text Interpretation:  Sinus rhythm Probable anteroseptal infarct, old Confirmed by Fredia Sorrow [id number] on 07/29/2018 9:39:44 AM   Radiology Dg Chest 2 View  Result Date: 07/29/2018 CLINICAL DATA:  Hives all over patient's body. EXAM: CHEST - 2 VIEW COMPARISON:  05/21/2018 FINDINGS: Cardiomediastinal silhouette is normal. Mediastinal contours appear intact. There is no evidence of focal airspace consolidation, pleural effusion or pneumothorax. Chronic interstitial lung changes, right middle lobe scarring, bronchiectasis and peribronchial thickening are stable. Stable bilateral lung granulomas. Osseous structures are without acute abnormality. Soft tissues are grossly normal.  IMPRESSION: No active cardiopulmonary disease. Chronic changes of COPD and interstitial lung disease. Electronically Signed   By: Fidela Salisbury M.D.   On: 07/29/2018 13:06    Procedures Procedures (including critical care time)  Medications Ordered in ED Medications  methylPREDNISolone sodium succinate (SOLU-MEDROL) 125 mg/2 mL injection 125 mg (125 mg Intravenous Given 07/29/18 1003)  famotidine (PEPCID) IVPB 20 mg premix (0 mg Intravenous Stopped 07/29/18 1106)  diphenhydrAMINE (BENADRYL) injection 25 mg (25 mg Intravenous Given 07/29/18 1010)  sodium chloride 0.9 % bolus 500 mL (0 mLs Intravenous Stopped 07/29/18 1247)  0.9 %  sodium chloride infusion ( Intravenous New Bag/Given 07/29/18 1108)     Initial Impression / Assessment and Plan / ED Course  I have reviewed the triage vital signs and the nursing notes.  Pertinent labs & imaging results that were available during my care of the patient were reviewed by me and considered in my medical decision making (see chart for details).     Imaging and labs reviewed and discussed with patient and wife.  He was given Solu-Medrol, Pepcid and Benadryl here with improving urticarial lesions, has continued on edema, but patient endorses it feels improved at this time.  He has no respiratory distress or compromise at this time.  It is unclear the source of patient's allergic reaction, but he has failed 1 course of patient treatment with current exacerbation worse than initial.  He will need admission for continued support and observation.  Given his petechiae, although his platelet count is not as low as historically, it may be beneficial for consult by hematology during this admission.  Final Clinical Impressions(s) / ED Diagnoses   Final diagnoses:  Urticaria of unknown origin  Petechial rash  Thrombocytopenia ([name])  Angioedema, subsequent encounter    ED Discharge Orders    None  Evalee Jefferson, PA-C 07/29/18 1720      Fredia Sorrow, MD 08/01/18 [phone]

## 2018-07-29 NOTE — Progress Notes (Signed)
Patients blood sugar was 269. Glucose machine hasn't transferred results. Gave insulin according to [facility] sliding scale. Will continue to monitor patient throughout shift.

## 2018-07-29 NOTE — ED Provider Notes (Signed)
Medical screening examination/treatment/procedure(s) were conducted as a shared visit with non-physician practitioner(s) and myself.  I personally evaluated the patient during the encounter.  EKG Interpretation  Date/Time:  Tuesday July 29 2018 09:33:20 EST Ventricular Rate:  79 PR Interval:    QRS Duration: 88 QT Interval:  375 QTC Calculation: 430 R Axis:   75 Text Interpretation:  Sinus rhythm Probable anteroseptal infarct, old Confirmed by Fredia Sorrow [id number]) on 07/29/2018 9:39:44 AM   CRITICAL CARE Performed by: Fredia Sorrow Total critical care time: 30 minutes Critical care time was exclusive of separately billable procedures and treating other patients. Critical care was necessary to treat or prevent imminent or life-threatening deterioration. Critical care was time spent personally by me on the following activities: development of treatment plan with patient and/or surrogate as well as nursing, discussions with consultants, evaluation of patient's response to treatment, examination of patient, obtaining history from patient or surrogate, ordering and performing treatments and interventions, ordering and review of laboratory studies, ordering and review of radiographic studies, pulse oximetry and re-evaluation of patient's condition.   Patient seen alone along with physician assistant.  Patient is a patient of Dr. [name].  Patient presenting with recurrent hives facial swelling and swelling to his tongue.  And throat irritation.  Patient was treated for hives about 2 weeks ago.  Finished up a steroid course a few days ago.  Said that the hives resolved.  Started to recur over the weekend.  Patient was taking Benadryl for it.  Last had Benadryl at 1 AM this morning.  Patient states he had a lot of tongue swelling yesterday it is improved some here today.  But the rash is spread to his face and head where before it was predominately on his upper legs and his abdomen and back.   Patient also with bruising to his legs.  Which is more consistent with lichen ecchymosis.  Does have a little bit of bruising to the left forearm but his wife says that happens frequently.  Chart review shows that patient has been followed by hematology for thrombocytopenia.  Patient's platelets here today are normal range.  Asked to see the patient by the physician assistant appropriately for concerns for angioedema.  Patient talking fairly well tongue is swollen but patient states less swollen than yesterday.  No stridor.  Patient does feel as if he has a little tightness in his throat.  But able to swallow foods fine.  We held off on giving epinephrine.  Patient treated with steroids IV Benadryl IV and Pepcid IV.  Rash on the face and scalp started to subside as well as on the trunk.  Obviously the ecchymosis did not resolve.  Tongue seems to be less swollen patient feels it is about the same when he came in but much better than yesterday.  No evidence of any airway compromise during the observation period here.   patient not on any new medications not on any medication that we would expect to cause this.  Patient will be admitted by the hospitalist.  The ecchymosis is of uncertain origin.  Patient had chest x-ray done without any acute findings he is known to have a history of COPD.  Patient overall feels better than when he arrived.  No worsening with the tongue swelling.  Patient able to eat and drink here.   Fredia Sorrow, MD 07/29/18 1425

## 2018-07-29 NOTE — ED Triage Notes (Signed)
Pt has hives all over body x 2 days with swelling in face, hands and tongue with irritated throat.  Pt had hives 2 weeks ago and was put on medications with no relief.

## 2018-07-29 NOTE — H&P (Signed)
History and Physical  [name] GMW:[id number] DOB: 1955/07/25 DOA: 07/29/2018  Referring physician: Alfonzo Feller  PCP: Sinda Du, MD   Chief Complaint: Hives  HPI: [name] is a 63 y.o. male who has been dealing with recurrent hives presented to the emergency department with hives all over the body for the past 2 days associated with swelling in the face hands and tongue.  He also reported some throat irritation.  He had hives about 2 weeks ago and was treated with medications but does not report that he had significant improvement in the symptoms.  He had been on steroids.  The patient also has noticed a rash on the thighs that is been present recently with these new findings of itchy rash and hives.  He has no fever or chills.  He denies having difficulty breathing or swallowing but he does have an uncomfortable sensation in the throat and has had some itching in the throat.  He does not know what precipitated the hives and rash.  ED course: The patient was treated with IV steroids Benadryl and Pepcid with mild improvement in symptoms however his symptoms did not completely resolved.  He was noted to have a significant rash on the face scalp and trunk and ecchymoses on the lower extremities involving the thighs that did not change with treatment.  He was noted to have thrombocytopenia with platelet count of 136 however this is better than it has been previously.  The patient had no signs of airway compromise.  He did not receive epinephrine in the emergency department.  He had no stridor on exam.  He is being admitted for observation management.  Review of Systems: All systems reviewed and apart from history of presenting illness, are negative.  Past Medical History:  Diagnosis Date  . Anxiety   . Asthma   . Chest pain    a. 2003 Cath: nl cors.  [name] COPD (chronic obstructive pulmonary disease) ([place])   . Depression   . Diabetes mellitus   . Dyslipidemia   . Dysrhythmia   .  Fracture 12/15   lower back "L-1"  . GERD (gastroesophageal reflux disease)   . Hypertension   . PONV (postoperative nausea and vomiting)   . Splenomegaly    Past Surgical History:  Procedure Laterality Date  . BIOPSY  07/26/2016   Procedure: BIOPSY;  Surgeon: [name], MD;  [facility];  Service: Endoscopy;;  gastric  . CARDIAC CATHETERIZATION  10/22/2001   normal coronary arteriea (Dr. Domenic Moras)  . COLONOSCOPY  08/31/2011   YQI:[id number] rectal and colon polyps-treated. Single tubular adenoma. Next TCS 08/2016  . COLONOSCOPY N/A 07/26/2016   Dr. [name]: diverticulosis in sigmoid and descending colon. 6 mm benign splenic flexure. surveillance 5 yeras  . ESOPHAGOGASTRODUODENOSCOPY N/A 07/26/2016   Dr. [name]: normal esophagus, portal gastropathy, chronic gastritis, normal duodenum, screening EGD 2 years   . ESOPHAGOGASTRODUODENOSCOPY (EGD) WITH ESOPHAGEAL DILATION N/A 05/15/2013   GLO:VFIEPP esophagus-status post Venia Minks dilation/Portal gastropathy. Antral erosions-status post biopsy. extrinsic compression along lesser curvature likely secondary to splenomegaly. gastric bx benign  . HERNIA REPAIR  03/2009  . POLYPECTOMY  07/26/2016   Procedure: POLYPECTOMY;  Surgeon: [name], MD;  [facility]: [facility];  Service: Endoscopy;;  colon  . TRANSTHORACIC ECHOCARDIOGRAM  2011   EF 29-51%, stage 1 diastolic dysfunction, trace TR & pulm valve regurg   . VASECTOMY  1989   Social History:  reports that he quit smoking  about 7 years ago. His smoking use included cigarettes. He has a 45.00 pack-year smoking history. He has quit using smokeless tobacco. He reports that he does not drink alcohol or use drugs.  Allergies  Allergen Reactions  . Baclofen     lethargic   . Simvastatin Nausea Only    Family History  Problem Relation Age of Onset  . Heart disease Mother   . Cancer Mother        lymph nodes  . Diabetes Mother   . Arthritis Other   . Lung disease Other   .  Asthma Other   . Kidney disease Other   . Ovarian cancer Sister   . Diabetes Brother   . Heart disease Brother   . Hypertension Brother   . Diabetes Sister   . Diabetes Sister   . Cirrhosis Sister 57       no etoh  . Colon cancer Neg Hx     Prior to Admission medications   Medication Sig Start Date End Date Taking? Authorizing Provider  acetaminophen (TYLENOL) 500 MG tablet Take 1,000 mg by mouth daily as needed for headache.   Yes [provider]  albuterol (PROVENTIL) (2.5 MG/3ML) 0.083% nebulizer solution Take 3 mLs (2.5 mg total) by nebulization every 2 (two) hours as needed for wheezing. 11/02/17  Yes Sinda Du, MD  ALPRAZolam [name]) 0.25 MG tablet Take 0.25 mg by mouth 2 (two) times daily.    Yes [provider]  aspirin EC 81 MG tablet Take 81 mg by mouth at bedtime.    Yes [provider]  atorvastatin (LIPITOR) 40 MG tablet Take 1 tablet (40 mg total) by mouth daily. NEED OV. 06/24/18  Yes Hilty, [name], MD  Choline Fenofibrate (FENOFIBRIC ACID) 135 MG CPDR Take 1 tablet by mouth daily. NEED OV. 07/22/18  Yes Hilty, [name], MD  clotrimazole-betamethasone (LOTRISONE) cream Apply 1 application topically 2 (two) times daily as needed (psoriasis).   Yes [provider]  diltiazem (CARDIZEM CD) 240 MG 24 hr capsule TAKE 1 CAPSULE BY MOUTH EVERY DAY 07/08/18  Yes Hilty, [name], MD  diphenhydrAMINE (BENADRYL) 25 MG tablet Take 50 mg by mouth every 8 (eight) hours as needed.   Yes [provider]  fluticasone (FLONASE) 50 MCG/ACT nasal spray Place 2 sprays into both nostrils daily as needed for allergies.  06/26/16  Yes [provider]  gabapentin (NEURONTIN) 300 MG capsule Take 1 capsule by mouth at bedtime. 07/18/17  Yes [provider]  HYDROcodone-acetaminophen (NORCO/VICODIN) 5-325 MG per tablet Take 1 tablet by mouth every 8 (eight) hours as needed for moderate pain. Patient taking differently: Take 1 tablet  by mouth daily as needed for moderate pain.  11/09/14  Yes [name], MD  JARDIANCE 25 MG TABS tablet Take 25 mg by mouth every evening.  01/01/17  Yes [provider]  Multiple Vitamin (MULITIVITAMIN WITH MINERALS) TABS Take 1 tablet by mouth at bedtime.    Yes [provider]  pantoprazole (PROTONIX) 40 MG tablet Take 1 tablet (40 mg total) by mouth daily. 07/31/17  Yes [name], NP  PROAIR HFA [phone] Base) MCG/ACT inhaler Inhale 2 puffs into the lungs every 4 (four) hours as needed for wheezing or shortness of breath.  08/29/15  Yes [provider]  sertraline (ZOLOFT) 50 MG tablet Take 1 tablet by mouth daily. 06/11/18  Yes [provider]  SitaGLIPtin-MetFORMIN HCl (JANUMET XR) [phone] MG TB24 Take 1  tablet by mouth daily.   Yes [provider]  tamsulosin (FLOMAX) 0.4 MG CAPS capsule Take 0.4 mg by mouth daily.   Yes [provider]  tiotropium (SPIRIVA) 18 MCG inhalation capsule Place 18 mcg into inhaler and inhale daily.   Yes [provider]   Physical Exam: Vitals:   07/29/18 1230 07/29/18 1300 07/29/18 1330 07/29/18 1400  BP: 108/70 105/73 103/67 109/75  Pulse: 78 85 86 94  Resp: 12 19 19 15   Temp:      TempSrc:      SpO2: 96% 95% 93% 94%  Weight:      Height:         General exam: Moderately built and nourished patient, lying comfortably supine on the gurney in no obvious distress.  Head, eyes and ENT: Nontraumatic and normocephalic. Pupils equally reacting to light and accommodation. Oral mucosa moist.  Mild enlargement of the tongue noted.  Neck: Supple. No JVD, carotid bruit or thyromegaly.  Lymphatics: No lymphadenopathy.  Respiratory system: Clear to auscultation. No increased work of breathing.  Cardiovascular system: S1 and S2 heard, RRR. No JVD, murmurs, gallops, clicks or pedal edema.  Gastrointestinal system: Abdomen is nondistended, soft and nontender. Normal bowel sounds heard. No  organomegaly or masses appreciated.  Central nervous system: Alert and oriented. No focal neurological deficits.  Extremities: Symmetric 5 x 5 power. Peripheral pulses symmetrically felt.   Skin: Extensive petechial rash involving both thighs nonblanching.  Improving urticarial rash noted on the face and trunk and upper extremities.       Musculoskeletal system: Negative exam.  Psychiatry: Pleasant and cooperative.  Labs on Admission:  Basic Metabolic Panel: Recent Labs  Lab 07/29/18 0950  NA 136  K 3.6  CL 100  CO2 25  GLUCOSE 147*  BUN 14  CREATININE 0.85  CALCIUM 9.0   Liver Function Tests: No results for input(s): AST, ALT, ALKPHOS, BILITOT, PROT, ALBUMIN in the last 168 hours. No results for input(s): LIPASE, AMYLASE in the last 168 hours. No results for input(s): AMMONIA in the last 168 hours. CBC: Recent Labs  Lab 07/29/18 0950  WBC 9.1  NEUTROABS 7.3  HGB 17.4*  HCT 55.4*  MCV 83.2  PLT 136*   Cardiac Enzymes: No results for input(s): CKTOTAL, CKMB, CKMBINDEX, TROPONINI in the last 168 hours.  BNP (last 3 results) No results for input(s): PROBNP in the last 8760 hours. CBG: No results for input(s): GLUCAP in the last 168 hours.  Radiological Exams on Admission: Dg Chest 2 View  Result Date: 07/29/2018 CLINICAL DATA:  Hives all over patient's body. EXAM: CHEST - 2 VIEW COMPARISON:  05/21/2018 FINDINGS: Cardiomediastinal silhouette is normal. Mediastinal contours appear intact. There is no evidence of focal airspace consolidation, pleural effusion or pneumothorax. Chronic interstitial lung changes, right middle lobe scarring, bronchiectasis and peribronchial thickening are stable. Stable bilateral lung granulomas. Osseous structures are without acute abnormality. Soft tissues are grossly normal. IMPRESSION: No active cardiopulmonary disease. Chronic changes of COPD and interstitial lung disease. Electronically Signed   By: Fidela Salisbury M.D.   On:  07/29/2018 13:06   EKG: Independently reviewed.  Normal sinus rhythm  Assessment/Plan Active Problems:   GERD (gastroesophageal reflux disease)   Thrombocytopenia ([name])   HTN (hypertension)   Dyslipidemia   Type 2 diabetes mellitus without complication, without long-term current use of insulin ([name])   NASH (nonalcoholic steatohepatitis)   Full body hives   Idiopathic angioedema   COPD (chronic obstructive pulmonary disease) ([place])  1. Recurrent urticarial rash with angioedema of the tongue-cause has not been identified at this time however will treat with IV steroids, loratadine and Pepcid as ordered.  Monitor on telemetry. 2. GERD-patient is being treated with Pepcid. 3. Essential hypertension-resume home medications if appropriate. 4. Thrombocytopenia-chronic-no active bleeding but will monitor closely. 5. Type 2 diabetes mellitus- we will provide supplemental sliding scale coverage as needed. 6. COPD-currently stable-we will provide nebulizers as needed.  DVT Prophylaxis: Lovenox Code Status: Full Family Communication: Bedside Disposition Plan: Home tomorrow if improved  Time spent: 53 minutes  Irwin Brakeman, MD [facility] 07/29/2018, 2:30 PM

## 2018-07-30 DIAGNOSIS — Z7951 Long term (current) use of inhaled steroids: Secondary | ICD-10-CM | POA: Diagnosis not present

## 2018-07-30 DIAGNOSIS — Z7982 Long term (current) use of aspirin: Secondary | ICD-10-CM | POA: Diagnosis not present

## 2018-07-30 DIAGNOSIS — J449 Chronic obstructive pulmonary disease, unspecified: Secondary | ICD-10-CM | POA: Diagnosis present

## 2018-07-30 DIAGNOSIS — K7581 Nonalcoholic steatohepatitis (NASH): Secondary | ICD-10-CM | POA: Diagnosis present

## 2018-07-30 DIAGNOSIS — I10 Essential (primary) hypertension: Secondary | ICD-10-CM | POA: Diagnosis present

## 2018-07-30 DIAGNOSIS — K219 Gastro-esophageal reflux disease without esophagitis: Secondary | ICD-10-CM | POA: Diagnosis present

## 2018-07-30 DIAGNOSIS — Z79899 Other long term (current) drug therapy: Secondary | ICD-10-CM | POA: Diagnosis not present

## 2018-07-30 DIAGNOSIS — Z87891 Personal history of nicotine dependence: Secondary | ICD-10-CM | POA: Diagnosis not present

## 2018-07-30 DIAGNOSIS — Z7984 Long term (current) use of oral hypoglycemic drugs: Secondary | ICD-10-CM | POA: Diagnosis not present

## 2018-07-30 DIAGNOSIS — E119 Type 2 diabetes mellitus without complications: Secondary | ICD-10-CM | POA: Diagnosis present

## 2018-07-30 DIAGNOSIS — F419 Anxiety disorder, unspecified: Secondary | ICD-10-CM | POA: Diagnosis present

## 2018-07-30 DIAGNOSIS — L509 Urticaria, unspecified: Secondary | ICD-10-CM | POA: Diagnosis present

## 2018-07-30 DIAGNOSIS — Z888 Allergy status to other drugs, medicaments and biological substances status: Secondary | ICD-10-CM | POA: Diagnosis not present

## 2018-07-30 DIAGNOSIS — T783XXA Angioneurotic edema, initial encounter: Secondary | ICD-10-CM | POA: Diagnosis present

## 2018-07-30 DIAGNOSIS — D696 Thrombocytopenia, unspecified: Secondary | ICD-10-CM | POA: Diagnosis present

## 2018-07-30 DIAGNOSIS — E785 Hyperlipidemia, unspecified: Secondary | ICD-10-CM | POA: Diagnosis present

## 2018-07-30 DIAGNOSIS — F329 Major depressive disorder, single episode, unspecified: Secondary | ICD-10-CM | POA: Diagnosis present

## 2018-07-30 LAB — TSH: TSH: 0.658 u[IU]/mL (ref 0.350–4.500)

## 2018-07-30 LAB — GLUCOSE, CAPILLARY
Glucose-Capillary: 230 mg/dL — ABNORMAL HIGH (ref 70–99)
Glucose-Capillary: 265 mg/dL — ABNORMAL HIGH (ref 70–99)

## 2018-07-30 LAB — CBC
HCT: 49.1 % (ref 39.0–52.0)
Hemoglobin: 15.3 g/dL (ref 13.0–17.0)
MCH: 26.4 pg (ref 26.0–34.0)
MCHC: 31.2 g/dL (ref 30.0–36.0)
MCV: 84.7 fL (ref 80.0–100.0)
Platelets: 121 10*3/uL — ABNORMAL LOW (ref 150–400)
RBC: 5.8 MIL/uL (ref 4.22–5.81)
RDW: 15 % (ref 11.5–15.5)
WBC: 7 10*3/uL (ref 4.0–10.5)
nRBC: 0 % (ref 0.0–0.2)

## 2018-07-30 MED ORDER — DIPHENHYDRAMINE HCL 25 MG PO CAPS
25.0000 mg | ORAL_CAPSULE | ORAL | Status: DC | PRN
  Administered 2018-07-30 (×3): 25 mg via ORAL
  Filled 2018-07-30 (×3): qty 1

## 2018-07-30 NOTE — Progress Notes (Signed)
Subjective: He was admitted yesterday with hives and swelling of his throat.  He says he feels much better.  His blood sugar is up because he is on steroids.  He is not changed anything that he is aware of.  He had a previous episode about a month ago which had resolved.  He has chronic low platelet count and he now has what looks like some more petechial rash and does not have the hives.  Objective: Vital signs in last 24 hours: Temp:  [97.7 F (36.5 C)-98.5 F (36.9 C)] 97.9 F (36.6 C) (01/22 0621) Pulse Rate:  [75-109] 109 (01/22 0621) Resp:  [11-22] 19 (01/22 0621) BP: (94-121)/(62-78) 110/69 (01/22 0621) SpO2:  [92 %-97 %] 93 % (01/22 0621) Weight:  [72.6 kg] 72.6 kg (01/21 0927) Weight change:  Last BM Date: 07/29/18  Intake/Output from previous day: 01/21 0701 - 01/22 0700 In: 522.7 [I.V.:522.7] Out: -   PHYSICAL EXAM General appearance: alert, cooperative and no distress Resp: clear to auscultation bilaterally Cardio: regular rate and rhythm, S1, S2 normal, no murmur, click, rub or gallop GI: soft, non-tender; bowel sounds normal; no masses,  no organomegaly Extremities: He does not have any edema. Skin: He still has some areas of erythema where he had the previous hives.  He has more of a petechial rash on his thighs that looks similar to what he had on his initial admission and on the picture and closed by Dr. Wynetta Emery Lab Results:  Results for orders placed or performed during the hospital encounter of 07/29/18 (from the past 48 hour(s))  CBC with Differential     Status: Abnormal   Collection Time: 07/29/18  9:50 AM  Result Value Ref Range   WBC 9.1 4.0 - 10.5 K/uL   RBC 6.66 (H) 4.22 - 5.81 MIL/uL   Hemoglobin 17.4 (H) 13.0 - 17.0 g/dL   HCT 55.4 (H) 39.0 - 52.0 %   MCV 83.2 80.0 - 100.0 fL   MCH 26.1 26.0 - 34.0 pg   MCHC 31.4 30.0 - 36.0 g/dL   RDW 17.4 (H) 11.5 - 15.5 %   Platelets 136 (L) 150 - 400 K/uL   nRBC 0.0 0.0 - 0.2 %   Neutrophils Relative % 80 %    Neutro Abs 7.3 1.7 - 7.7 K/uL   Lymphocytes Relative 14 %   Lymphs Abs 1.2 0.7 - 4.0 K/uL   Monocytes Relative 4 %   Monocytes Absolute 0.4 0.1 - 1.0 K/uL   Eosinophils Relative 1 %   Eosinophils Absolute 0.1 0.0 - 0.5 K/uL   Basophils Relative 0 %   Basophils Absolute 0.0 0.0 - 0.1 K/uL   Immature Granulocytes 1 %   Abs Immature Granulocytes 0.08 (H) 0.00 - 0.07 K/uL    Comment: Performed at [facility], [address], [place], [place] [address]  Basic metabolic panel     Status: Abnormal   Collection Time: 07/29/18  9:50 AM  Result Value Ref Range   Sodium 136 135 - 145 mmol/L   Potassium 3.6 3.5 - 5.1 mmol/L   Chloride 100 98 - 111 mmol/L   CO2 25 22 - 32 mmol/L   Glucose, Bld 147 (H) 70 - 99 mg/dL   BUN 14 8 - 23 mg/dL   Creatinine, Ser 0.85 0.61 - 1.24 mg/dL   Calcium 9.0 8.9 - 10.3 mg/dL   GFR calc non Af Amer >60 >60 mL/min   GFR calc Af Amer >60 >60 mL/min   Anion  gap 11 5 - 15    Comment: Performed at [facility], [address], [place], [place] [address]  Urinalysis, Routine w reflex microscopic     Status: Abnormal   Collection Time: 07/29/18 10:07 AM  Result Value Ref Range   Color, Urine YELLOW YELLOW   APPearance CLEAR CLEAR   Specific Gravity, Urine 1.016 1.005 - 1.030   pH 7.0 5.0 - 8.0   Glucose, UA >=500 (A) NEGATIVE mg/dL   Hgb urine dipstick NEGATIVE NEGATIVE   Bilirubin Urine NEGATIVE NEGATIVE   Ketones, ur NEGATIVE NEGATIVE mg/dL   Protein, ur NEGATIVE NEGATIVE mg/dL   Nitrite NEGATIVE NEGATIVE   Leukocytes, UA NEGATIVE NEGATIVE   WBC, UA 0-5 0 - 5 WBC/hpf   Bacteria, UA NONE SEEN NONE SEEN    Comment: Performed at [facility], [address] [place], [place] [address]  Protime-INR     Status: None   Collection Time: 07/29/18 10:39 AM  Result Value Ref Range   Prothrombin Time 12.4 11.4 - 15.2 seconds   INR 0.93     Comment: Performed at [facility], [address], [place], [place] [address]  CBG monitoring, ED     Status: Abnormal    Collection Time: 07/29/18  4:45 PM  Result Value Ref Range   Glucose-Capillary 262 (H) 70 - 99 mg/dL  Glucose, capillary     Status: Abnormal   Collection Time: 07/29/18 10:38 PM  Result Value Ref Range   Glucose-Capillary 269 (H) 70 - 99 mg/dL  CBC     Status: Abnormal   Collection Time: 07/30/18  5:26 AM  Result Value Ref Range   WBC 7.0 4.0 - 10.5 K/uL   RBC 5.80 4.22 - 5.81 MIL/uL   Hemoglobin 15.3 13.0 - 17.0 g/dL   HCT 49.1 39.0 - 52.0 %   MCV 84.7 80.0 - 100.0 fL   MCH 26.4 26.0 - 34.0 pg   MCHC 31.2 30.0 - 36.0 g/dL   RDW 15.0 11.5 - 15.5 %   Platelets 121 (L) 150 - 400 K/uL   nRBC 0.0 0.0 - 0.2 %    Comment: Performed at [facility], [address], [place], [place] [address]  TSH     Status: None   Collection Time: 07/30/18  5:26 AM  Result Value Ref Range   TSH 0.658 0.350 - 4.500 uIU/mL    Comment: Performed by a 3rd Generation assay with a functional sensitivity of <=0.01 uIU/mL. Performed at [facility], [address], [place],  [address]   Glucose, capillary     Status: Abnormal   Collection Time: 07/30/18  7:35 AM  Result Value Ref Range   Glucose-Capillary 230 (H) 70 - 99 mg/dL    ABGS No results for input(s): PHART, PO2ART, TCO2, HCO3 in the last 72 hours.  Invalid input(s): PCO2 CULTURES No results found for this or any previous visit (from the past 240 hour(s)). Studies/Results: Dg Chest 2 View  Result Date: 07/29/2018 CLINICAL DATA:  Hives all over patient's body. EXAM: CHEST - 2 VIEW COMPARISON:  05/21/2018 FINDINGS: Cardiomediastinal silhouette is normal. Mediastinal contours appear intact. There is no evidence of focal airspace consolidation, pleural effusion or pneumothorax. Chronic interstitial lung changes, right middle lobe scarring, bronchiectasis and peribronchial thickening are stable. Stable bilateral lung granulomas. Osseous structures are without acute abnormality. Soft tissues are grossly normal. IMPRESSION: No active  cardiopulmonary disease. Chronic changes of COPD and interstitial lung disease. Electronically Signed   By: Fidela Salisbury  M.D.   On: 07/29/2018 13:06    Medications:  Prior to Admission:  Medications Prior to Admission  Medication Sig Dispense Refill Last Dose  . acetaminophen (TYLENOL) 500 MG tablet Take 1,000 mg by mouth daily as needed for headache.   Taking  . albuterol (PROVENTIL) (2.5 MG/3ML) 0.083% nebulizer solution Take 3 mLs (2.5 mg total) by nebulization every 2 (two) hours as needed for wheezing. 75 mL 12 Taking  . ALPRAZolam (XANAX) 0.25 MG tablet Take 0.25 mg by mouth 2 (two) times daily.    07/28/2018 at 2000  . aspirin EC 81 MG tablet Take 81 mg by mouth at bedtime.    07/28/2018 at 2000  . atorvastatin (LIPITOR) 40 MG tablet Take 1 tablet (40 mg total) by mouth daily. NEED OV. 90 tablet 0 07/28/2018 at 2000  . Choline Fenofibrate (FENOFIBRIC ACID) 135 MG CPDR Take 1 tablet by mouth daily. NEED OV. 15 capsule 0 07/28/2018 at 2000  . clotrimazole-betamethasone (LOTRISONE) cream Apply 1 application topically 2 (two) times daily as needed (psoriasis).   Taking  . diltiazem (CARDIZEM CD) 240 MG 24 hr capsule TAKE 1 CAPSULE BY MOUTH EVERY DAY 60 capsule 0 07/28/2018 at 2000  . diphenhydrAMINE (BENADRYL) 25 MG tablet Take 50 mg by mouth every 8 (eight) hours as needed.   07/29/2018 at 0100  . fluticasone (FLONASE) 50 MCG/ACT nasal spray Place 2 sprays into both nostrils daily as needed for allergies.    Taking  . gabapentin (NEURONTIN) 300 MG capsule Take 1 capsule by mouth at bedtime.  5 07/28/2018 at 2000  . HYDROcodone-acetaminophen (NORCO/VICODIN) 5-325 MG per tablet Take 1 tablet by mouth every 8 (eight) hours as needed for moderate pain. (Patient taking differently: Take 1 tablet by mouth daily as needed for moderate pain. ) 90 tablet 0 Taking  . JARDIANCE 25 MG TABS tablet Take 25 mg by mouth every evening.   3 07/28/2018 at 2000  . Multiple Vitamin (MULITIVITAMIN WITH MINERALS)  TABS Take 1 tablet by mouth at bedtime.    07/28/2018 at 2000  . pantoprazole (PROTONIX) 40 MG tablet Take 1 tablet (40 mg total) by mouth daily. 90 tablet 3 07/28/2018 at 0700  . PROAIR HFA 108 (90 Base) MCG/ACT inhaler Inhale 2 puffs into the lungs every 4 (four) hours as needed for wheezing or shortness of breath.   12 Past Week at Unknown time  . sertraline (ZOLOFT) 50 MG tablet Take 1 tablet by mouth daily.   07/28/2018 at 0700  . SitaGLIPtin-MetFORMIN HCl (JANUMET XR) [id number] MG TB24 Take 1 tablet by mouth daily.   07/28/2018 at 0700  . tamsulosin (FLOMAX) 0.4 MG CAPS capsule Take 0.4 mg by mouth daily.   07/28/2018 at 0700  . tiotropium (SPIRIVA) 18 MCG inhalation capsule Place 18 mcg into inhaler and inhale daily.   07/28/2018 at 0700   Scheduled: . aspirin EC  81 mg Oral QHS  . atorvastatin  40 mg Oral Daily  . diltiazem  240 mg Oral Daily  . famotidine  20 mg Oral BID  . gabapentin  300 mg Oral QHS  . insulin aspart  0-15 Units Subcutaneous TID WC  . insulin aspart  0-5 Units Subcutaneous QHS  . loratadine  10 mg Oral Daily  . methylPREDNISolone (SOLU-MEDROL) injection  60 mg Intravenous Q6H  . sertraline  50 mg Oral Daily  . tamsulosin  0.4 mg Oral Daily  . umeclidinium bromide  1 puff Inhalation Daily   Continuous: . sodium chloride  50 mL/hr at 07/30/18 0505   WLK:[id number] **OR** acetaminophen, ALPRAZolam, diphenhydrAMINE, fluticasone, HYDROcodone-acetaminophen, ipratropium-albuterol, ondansetron **OR** ondansetron (ZOFRAN) IV  Assesment: He was admitted with hives and angioedema.  He is much improved but we do not know the cause.  I have seen 2 cases of tickborne illness that caused a similar presentation so I am going to have him checked.  I am particularly concerned that he may have alpha gal syndrome from the [facility] tick.  He has COPD at baseline which is pretty stable  He has diabetes which is not controlled now but that is because he is on large doses of IV  steroids  He has hypertension which is stable  He has thrombocytopenia but I do not think that is causing his rash because his platelet count is actually better Active Problems:   GERD (gastroesophageal reflux disease)   Thrombocytopenia ([name])   HTN (hypertension)   Dyslipidemia   Type 2 diabetes mellitus without complication, without long-term current use of insulin ([name])   NASH (nonalcoholic steatohepatitis)   Full body hives   Idiopathic angioedema   COPD (chronic obstructive pulmonary disease) ([place])    Plan: Check labs.  Continue other treatments.    LOS: 0 days   Alonza Bogus 07/30/2018, 8:20 AM

## 2018-07-30 NOTE — Progress Notes (Signed)
Patient's blood sugar 250 as verified on glucometer. Information did not transfer over to Epic, but insulin given for CBG according to sliding scale. Will continue to monitor.

## 2018-07-30 NOTE — Care Management Obs Status (Signed)
[facility] NOTIFICATION   Patient Details  Name: [name] MRN: [id number] Date of Birth: 01-20-1956   Medicare Observation Status Notification Given:  Yes    Shelda Altes 07/30/2018, 12:35 PM

## 2018-07-31 LAB — B. BURGDORFI ANTIBODIES

## 2018-07-31 MED ORDER — CETIRIZINE HCL 10 MG PO TABS: 10.0000 mg | ORAL_TABLET | Freq: Every day | ORAL | 1 refills | Status: DC

## 2018-07-31 MED ORDER — FAMOTIDINE 40 MG PO TABS: 20.0000 mg | ORAL_TABLET | Freq: Two times a day (BID) | ORAL | 5 refills | Status: DC

## 2018-07-31 MED ORDER — DIPHENHYDRAMINE HCL 25 MG PO TABS: 50.0000 mg | ORAL_TABLET | ORAL | 0 refills | Status: DC | PRN

## 2018-07-31 NOTE — Discharge Summary (Signed)
Physician Discharge Summary  Patient ID: [name] MRN: [id number] DOB/AGE: Nov 13, 1955 63 y.o. Primary Care Physician:Dodger Sinning, Percell Miller, MD Admit date: 07/29/2018 Discharge date: 07/31/2018    Discharge Diagnoses:   Active Problems:   GERD (gastroesophageal reflux disease)   Thrombocytopenia ([name])   HTN (hypertension)   Dyslipidemia   Type 2 diabetes mellitus without complication, without long-term current use of insulin ([name])   NASH (nonalcoholic steatohepatitis)   Full body hives   Idiopathic angioedema   COPD (chronic obstructive pulmonary disease) ([name])   Hives   Allergies as of 07/31/2018      Reactions   Baclofen    lethargic    Simvastatin Nausea Only      Medication List    TAKE these medications   acetaminophen 500 MG tablet Commonly known as:  TYLENOL Take 1,000 mg by mouth daily as needed for headache.   ALPRAZolam 0.25 MG tablet Commonly known as:  XANAX Take 0.25 mg by mouth 2 (two) times daily.   aspirin EC 81 MG tablet Take 81 mg by mouth at bedtime.   atorvastatin 40 MG tablet Commonly known as:  LIPITOR Take 1 tablet (40 mg total) by mouth daily. NEED OV.   cetirizine 10 MG tablet Commonly known as:  ZYRTEC Take 1 tablet (10 mg total) by mouth daily.   clotrimazole-betamethasone cream Commonly known as:  LOTRISONE Apply 1 application topically 2 (two) times daily as needed (psoriasis).   diltiazem 240 MG 24 hr capsule Commonly known as:  CARDIZEM CD TAKE 1 CAPSULE BY MOUTH EVERY DAY   diphenhydrAMINE 25 MG tablet Commonly known as:  BENADRYL Take 2 tablets (50 mg total) by mouth every 4 (four) hours as needed for itching. What changed:    when to take this  reasons to take this   famotidine 40 MG tablet Commonly known as:  PEPCID Take 0.5 tablets (20 mg total) by mouth 2 (two) times daily.   Fenofibric Acid 135 MG Cpdr Take 1 tablet by mouth daily. NEED OV.   fluticasone 50 MCG/ACT nasal spray Commonly known as:   FLONASE Place 2 sprays into both nostrils daily as needed for allergies.   gabapentin 300 MG capsule Commonly known as:  NEURONTIN Take 1 capsule by mouth at bedtime.   HYDROcodone-acetaminophen 5-325 MG tablet Commonly known as:  NORCO/VICODIN Take 1 tablet by mouth every 8 (eight) hours as needed for moderate pain. What changed:  when to take this   JANUMET XR [phone] MG Tb24 Generic drug:  SitaGLIPtin-MetFORMIN HCl Take 1 tablet by mouth daily.   JARDIANCE 25 MG Tabs tablet Generic drug:  empagliflozin Take 25 mg by mouth every evening.   multivitamin with minerals Tabs tablet Take 1 tablet by mouth at bedtime.   pantoprazole 40 MG tablet Commonly known as:  PROTONIX Take 1 tablet (40 mg total) by mouth daily.   PROAIR HFA 108 (90 Base) MCG/ACT inhaler Generic drug:  albuterol Inhale 2 puffs into the lungs every 4 (four) hours as needed for wheezing or shortness of breath.   albuterol (2.5 MG/3ML) 0.083% nebulizer solution Commonly known as:  PROVENTIL Take 3 mLs (2.5 mg total) by nebulization every 2 (two) hours as needed for wheezing.   sertraline 50 MG tablet Commonly known as:  ZOLOFT Take 1 tablet by mouth daily.   tamsulosin 0.4 MG Caps capsule Commonly known as:  FLOMAX Take 0.4 mg by mouth daily.   tiotropium 18 MCG inhalation capsule Commonly known as:  Ord 18  mcg into inhaler and inhale daily.       Discharged Condition: Improved    Consults: None  Significant Diagnostic Studies: Dg Chest 2 View  Result Date: 07/29/2018 CLINICAL DATA:  Hives all over patient's body. EXAM: CHEST - 2 VIEW COMPARISON:  05/21/2018 FINDINGS: Cardiomediastinal silhouette is normal. Mediastinal contours appear intact. There is no evidence of focal airspace consolidation, pleural effusion or pneumothorax. Chronic interstitial lung changes, right middle lobe scarring, bronchiectasis and peribronchial thickening are stable. Stable bilateral lung granulomas.  Osseous structures are without acute abnormality. Soft tissues are grossly normal. IMPRESSION: No active cardiopulmonary disease. Chronic changes of COPD and interstitial lung disease. Electronically Signed   By: Fidela Salisbury M.D.   On: 07/29/2018 13:06    Lab Results: Basic Metabolic Panel: Recent Labs    07/29/18 0950  NA 136  K 3.6  CL 100  CO2 25  GLUCOSE 147*  BUN 14  CREATININE 0.85  CALCIUM 9.0   Liver Function Tests: No results for input(s): AST, ALT, ALKPHOS, BILITOT, PROT, ALBUMIN in the last 72 hours.   CBC: Recent Labs    07/29/18 0950 07/30/18 0526  WBC 9.1 7.0  NEUTROABS 7.3  --   HGB 17.4* 15.3  HCT 55.4* 49.1  MCV 83.2 84.7  PLT 136* 121*    No results found for this or any previous visit (from the past 240 hour(s)).   Hospital Course: This is a 63 year old who came to the emergency department because of diffuse hives.  He had an episode similar to this about 3 weeks ago which cleared with prednisone.  He is not changed anything that he is aware of.  He has not had any tick bites.  It has been cold so there is some possibility that this is cold induced urticaria.  He had what felt like some swelling of his throat so he may have had some angioedema as well.  He was treated with steroids Pepcid Claritin Benadryl and improved.  He is going to be discharged home to continue a course of all of the above and then have dermatology and allergy testing  Discharge Exam: Blood pressure 107/62, pulse 63, temperature 98 F (36.7 C), temperature source Oral, resp. rate 20, height 5' 7"  (1.702 m), weight 72.6 kg, SpO2 93 %. He is awake and alert.  He has a petechial rash on his thighs but no urticaria  Disposition: Home      Signed: Alonza Bogus   07/31/2018, 8:57 AM

## 2018-07-31 NOTE — Progress Notes (Signed)
Subjective: He feels much better and wants to go home.  He is not having hives now.  Most of his blood testing regarding tickborne illness is still pending.  Objective: Vital signs in last 24 hours: Temp:  [97.6 F (36.4 C)-98 F (36.7 C)] 98 F (36.7 C) (01/23 0634) Pulse Rate:  [63-118] 63 (01/23 0634) Resp:  [18-20] 20 (01/23 0634) BP: (104-112)/(57-74) 107/62 (01/23 0634) SpO2:  [90 %-96 %] 93 % (01/23 0719) Weight change:  Last BM Date: 07/29/18  Intake/Output from previous day: 01/22 0701 - 01/23 0700 In: 1257.1 [P.O.:720; I.V.:537.1] Out: 600 [Urine:600]  PHYSICAL EXAM General appearance: alert, cooperative and no distress Resp: clear to auscultation bilaterally Cardio: regular rate and rhythm, S1, S2 normal, no murmur, click, rub or gallop GI: soft, non-tender; bowel sounds normal; no masses,  no organomegaly Extremities: He still has a petechial rash on both thighs.  No urticaria  Lab Results:  Results for orders placed or performed during the hospital encounter of 07/29/18 (from the past 48 hour(s))  CBC with Differential     Status: Abnormal   Collection Time: 07/29/18  9:50 AM  Result Value Ref Range   WBC 9.1 4.0 - 10.5 K/uL   RBC 6.66 (H) 4.22 - 5.81 MIL/uL   Hemoglobin 17.4 (H) 13.0 - 17.0 g/dL   HCT 55.4 (H) 39.0 - 52.0 %   MCV 83.2 80.0 - 100.0 fL   MCH 26.1 26.0 - 34.0 pg   MCHC 31.4 30.0 - 36.0 g/dL   RDW 17.4 (H) 11.5 - 15.5 %   Platelets 136 (L) 150 - 400 K/uL   nRBC 0.0 0.0 - 0.2 %   Neutrophils Relative % 80 %   Neutro Abs 7.3 1.7 - 7.7 K/uL   Lymphocytes Relative 14 %   Lymphs Abs 1.2 0.7 - 4.0 K/uL   Monocytes Relative 4 %   Monocytes Absolute 0.4 0.1 - 1.0 K/uL   Eosinophils Relative 1 %   Eosinophils Absolute 0.1 0.0 - 0.5 K/uL   Basophils Relative 0 %   Basophils Absolute 0.0 0.0 - 0.1 K/uL   Immature Granulocytes 1 %   Abs Immature Granulocytes 0.08 (H) 0.00 - 0.07 K/uL    Comment: Performed at [facility], [address]  [place], [place] [address]  Basic metabolic panel     Status: Abnormal   Collection Time: 07/29/18  9:50 AM  Result Value Ref Range   Sodium 136 135 - 145 mmol/L   Potassium 3.6 3.5 - 5.1 mmol/L   Chloride 100 98 - 111 mmol/L   CO2 25 22 - 32 mmol/L   Glucose, Bld 147 (H) 70 - 99 mg/dL   BUN 14 8 - 23 mg/dL   Creatinine, Ser 0.85 0.61 - 1.24 mg/dL   Calcium 9.0 8.9 - 10.3 mg/dL   GFR calc non Af Amer >60 >60 mL/min   GFR calc Af Amer >60 >60 mL/min   Anion gap 11 5 - 15    Comment: Performed at [facility], [address], [place], [place] [address]  Urinalysis, Routine w reflex microscopic     Status: Abnormal   Collection Time: 07/29/18 10:07 AM  Result Value Ref Range   Color, Urine YELLOW YELLOW   APPearance CLEAR CLEAR   Specific Gravity, Urine 1.016 1.005 - 1.030   pH 7.0 5.0 - 8.0   Glucose, UA >=500 (A) NEGATIVE mg/dL   Hgb urine dipstick NEGATIVE NEGATIVE   Bilirubin Urine NEGATIVE NEGATIVE  Ketones, ur NEGATIVE NEGATIVE mg/dL   Protein, ur NEGATIVE NEGATIVE mg/dL   Nitrite NEGATIVE NEGATIVE   Leukocytes, UA NEGATIVE NEGATIVE   WBC, UA 0-5 0 - 5 WBC/hpf   Bacteria, UA NONE SEEN NONE SEEN    Comment: Performed at [facility], [address] [place], [place] [address]  Protime-INR     Status: None   Collection Time: 07/29/18 10:39 AM  Result Value Ref Range   Prothrombin Time 12.4 11.4 - 15.2 seconds   INR 0.93     Comment: Performed at [facility], [address], [place], [place] [address]  CBG monitoring, ED     Status: Abnormal   Collection Time: 07/29/18  4:45 PM  Result Value Ref Range   Glucose-Capillary 262 (H) 70 - 99 mg/dL  Glucose, capillary     Status: Abnormal   Collection Time: 07/29/18 10:38 PM  Result Value Ref Range   Glucose-Capillary 269 (H) 70 - 99 mg/dL  CBC     Status: Abnormal   Collection Time: 07/30/18  5:26 AM  Result Value Ref Range   WBC 7.0 4.0 - 10.5 K/uL   RBC 5.80 4.22 - 5.81 MIL/uL   Hemoglobin 15.3 13.0 - 17.0 g/dL   HCT 49.1  39.0 - 52.0 %   MCV 84.7 80.0 - 100.0 fL   MCH 26.4 26.0 - 34.0 pg   MCHC 31.2 30.0 - 36.0 g/dL   RDW 15.0 11.5 - 15.5 %   Platelets 121 (L) 150 - 400 K/uL   nRBC 0.0 0.0 - 0.2 %    Comment: Performed at [facility], [address], [place], [place] [address]  TSH     Status: None   Collection Time: 07/30/18  5:26 AM  Result Value Ref Range   TSH 0.658 0.350 - 4.500 uIU/mL    Comment: Performed by a 3rd Generation assay with a functional sensitivity of <=0.01 uIU/mL. Performed at [facility], [address] [place], [place] [address]   Glucose, capillary     Status: Abnormal   Collection Time: 07/30/18  7:35 AM  Result Value Ref Range   Glucose-Capillary 230 (H) 70 - 99 mg/dL  Glucose, capillary     Status: Abnormal   Collection Time: 07/30/18  5:02 PM  Result Value Ref Range   Glucose-Capillary 265 (H) 70 - 99 mg/dL    ABGS No results for input(s): PHART, PO2ART, TCO2, HCO3 in the last 72 hours.  Invalid input(s): PCO2 CULTURES No results found for this or any previous visit (from the past 240 hour(s)). Studies/Results: Dg Chest 2 View  Result Date: 07/29/2018 CLINICAL DATA:  Hives all over patient's body. EXAM: CHEST - 2 VIEW COMPARISON:  05/21/2018 FINDINGS: Cardiomediastinal silhouette is normal. Mediastinal contours appear intact. There is no evidence of focal airspace consolidation, pleural effusion or pneumothorax. Chronic interstitial lung changes, right middle lobe scarring, bronchiectasis and peribronchial thickening are stable. Stable bilateral lung granulomas. Osseous structures are without acute abnormality. Soft tissues are grossly normal. IMPRESSION: No active cardiopulmonary disease. Chronic changes of COPD and interstitial lung disease. Electronically Signed   By: Fidela Salisbury M.D.   On: 07/29/2018 13:06    Medications:  Prior to Admission:  Medications Prior to Admission  Medication Sig Dispense Refill Last Dose  . acetaminophen (TYLENOL) 500 MG  tablet Take 1,000 mg by mouth daily as needed for headache.   Taking  . albuterol (PROVENTIL) (2.5 MG/3ML) 0.083% nebulizer solution Take 3 mLs (2.5 mg total) by nebulization  every 2 (two) hours as needed for wheezing. 75 mL 12 Taking  . ALPRAZolam (XANAX) 0.25 MG tablet Take 0.25 mg by mouth 2 (two) times daily.    07/28/2018 at 2000  . aspirin EC 81 MG tablet Take 81 mg by mouth at bedtime.    07/28/2018 at 2000  . atorvastatin (LIPITOR) 40 MG tablet Take 1 tablet (40 mg total) by mouth daily. NEED OV. 90 tablet 0 07/28/2018 at 2000  . Choline Fenofibrate (FENOFIBRIC ACID) 135 MG CPDR Take 1 tablet by mouth daily. NEED OV. 15 capsule 0 07/28/2018 at 2000  . clotrimazole-betamethasone (LOTRISONE) cream Apply 1 application topically 2 (two) times daily as needed (psoriasis).   Taking  . diltiazem (CARDIZEM CD) 240 MG 24 hr capsule TAKE 1 CAPSULE BY MOUTH EVERY DAY 60 capsule 0 07/28/2018 at 2000  . diphenhydrAMINE (BENADRYL) 25 MG tablet Take 50 mg by mouth every 8 (eight) hours as needed.   07/29/2018 at 0100  . fluticasone (FLONASE) 50 MCG/ACT nasal spray Place 2 sprays into both nostrils daily as needed for allergies.    Taking  . gabapentin (NEURONTIN) 300 MG capsule Take 1 capsule by mouth at bedtime.  5 07/28/2018 at 2000  . HYDROcodone-acetaminophen (NORCO/VICODIN) 5-325 MG per tablet Take 1 tablet by mouth every 8 (eight) hours as needed for moderate pain. (Patient taking differently: Take 1 tablet by mouth daily as needed for moderate pain. ) 90 tablet 0 Taking  . JARDIANCE 25 MG TABS tablet Take 25 mg by mouth every evening.   3 07/28/2018 at 2000  . Multiple Vitamin (MULITIVITAMIN WITH MINERALS) TABS Take 1 tablet by mouth at bedtime.    07/28/2018 at 2000  . pantoprazole (PROTONIX) 40 MG tablet Take 1 tablet (40 mg total) by mouth daily. 90 tablet 3 07/28/2018 at 0700  . PROAIR HFA 108 (90 Base) MCG/ACT inhaler Inhale 2 puffs into the lungs every 4 (four) hours as needed for wheezing or shortness of  breath.   12 Past Week at Unknown time  . sertraline (ZOLOFT) 50 MG tablet Take 1 tablet by mouth daily.   07/28/2018 at 0700  . SitaGLIPtin-MetFORMIN HCl (JANUMET XR) [phone] MG TB24 Take 1 tablet by mouth daily.   07/28/2018 at 0700  . tamsulosin (FLOMAX) 0.4 MG CAPS capsule Take 0.4 mg by mouth daily.   07/28/2018 at 0700  . tiotropium (SPIRIVA) 18 MCG inhalation capsule Place 18 mcg into inhaler and inhale daily.   07/28/2018 at 0700   Scheduled: . aspirin EC  81 mg Oral QHS  . atorvastatin  40 mg Oral Daily  . diltiazem  240 mg Oral Daily  . famotidine  20 mg Oral BID  . gabapentin  300 mg Oral QHS  . insulin aspart  0-15 Units Subcutaneous TID WC  . insulin aspart  0-5 Units Subcutaneous QHS  . loratadine  10 mg Oral Daily  . methylPREDNISolone (SOLU-MEDROL) injection  60 mg Intravenous Q6H  . sertraline  50 mg Oral Daily  . tamsulosin  0.4 mg Oral Daily  . umeclidinium bromide  1 puff Inhalation Daily   Continuous: . sodium chloride 50 mL/hr at 07/31/18 0152   WUJ:[id number] **OR** acetaminophen, ALPRAZolam, diphenhydrAMINE, fluticasone, HYDROcodone-acetaminophen, ipratropium-albuterol, ondansetron **OR** ondansetron (ZOFRAN) IV  Assesment: He was admitted with generalized hives.  I do not think it is entirely clear what this is from.  He had some angioedema associated with this.  The differential diagnosis is very wide in its not clear what he has  yet.  He is better and wants to go home.  I think is reasonable to discharge him for follow-up as an outpatient Active Problems:   GERD (gastroesophageal reflux disease)   Thrombocytopenia ([name])   HTN (hypertension)   Dyslipidemia   Type 2 diabetes mellitus without complication, without long-term current use of insulin ([name])   NASH (nonalcoholic steatohepatitis)   Full body hives   Idiopathic angioedema   COPD (chronic obstructive pulmonary disease) ([place])   Hives    Plan: Discharge home today.  He will need allergy consult  and dermatology consult.    LOS: 1 day   Alonza Bogus 07/31/2018, 8:50 AM

## 2018-08-01 LAB — ROCKY MTN SPOTTED FVR ABS PNL(IGG+IGM)
RMSF IgG: NEGATIVE
RMSF IgM: 0.27 index (ref 0.00–0.89)

## 2018-08-01 LAB — IGE: IgE (Immunoglobulin E), Serum: 71 IU/mL (ref 6–495)

## 2018-08-03 ENCOUNTER — Other Ambulatory Visit: Payer: Self-pay | Admitting: Gastroenterology

## 2018-08-03 DIAGNOSIS — J441 Chronic obstructive pulmonary disease with (acute) exacerbation: Secondary | ICD-10-CM | POA: Diagnosis not present

## 2018-08-04 ENCOUNTER — Other Ambulatory Visit: Payer: Self-pay

## 2018-08-04 LAB — MISC LABCORP TEST (SEND OUT): Labcorp test code: 807003

## 2018-08-04 NOTE — Patient Outreach (Signed)
[facility]) Care Management  [place]  08/04/2018  [name] 14-Nov-1955 [id number]  Reason for call: post discharge medication review  Unsuccessful telephone call attempt #1 to patient.   HIPAA compliant voicemail left requesting a return call.  Plan:  I will make another outreach attempt to patient within 3-4 business days.  Joetta Manners, PharmD Clinical Pharmacist [place] [phone]

## 2018-08-06 DIAGNOSIS — J441 Chronic obstructive pulmonary disease with (acute) exacerbation: Secondary | ICD-10-CM | POA: Diagnosis not present

## 2018-08-08 ENCOUNTER — Ambulatory Visit: Payer: Self-pay

## 2018-08-08 ENCOUNTER — Other Ambulatory Visit: Payer: Self-pay

## 2018-08-08 NOTE — Patient Outreach (Signed)
[facility]) Care Management  [place]  08/08/2018  BURNEY CALZADILLA 1956/05/25 [id number]  Reason for call: post discharge medication review  Unsuccessful telephone call attempt #2 to patient.   HIPAA compliant voicemail left requesting a return call.  Plan:  I will make another outreach attempt to patient within 3-4 business days.  Joetta Manners, PharmD Clinical Pharmacist [place] [phone]

## 2018-08-12 ENCOUNTER — Other Ambulatory Visit: Payer: Self-pay

## 2018-08-12 LAB — GLUCOSE, CAPILLARY
Glucose-Capillary: 250 mg/dL — ABNORMAL HIGH (ref 70–99)
Glucose-Capillary: 302 mg/dL — ABNORMAL HIGH (ref 70–99)
Glucose-Capillary: 196 mg/dL — ABNORMAL HIGH (ref 70–99)

## 2018-08-12 NOTE — Patient Outreach (Signed)
[facility]) Care Management  [place]  08/12/2018  [name] August 25, 1955 [id number]  Reason for call: post discharge medication review  Unsuccessful telephone call attempt # 3 to patient.   HIPAA compliant voicemail left requesting a return call.  Plan: Will make no further telephone attempts at this time to complete a discharge medication review.  Joetta Manners, PharmD Clinical Pharmacist [place] [phone]

## 2018-08-21 ENCOUNTER — Inpatient Hospital Stay (HOSPITAL_COMMUNITY): Payer: PPO | Attending: Hematology

## 2018-08-21 DIAGNOSIS — K921 Melena: Secondary | ICD-10-CM | POA: Diagnosis not present

## 2018-08-21 DIAGNOSIS — E785 Hyperlipidemia, unspecified: Secondary | ICD-10-CM | POA: Insufficient documentation

## 2018-08-21 DIAGNOSIS — K219 Gastro-esophageal reflux disease without esophagitis: Secondary | ICD-10-CM | POA: Diagnosis not present

## 2018-08-21 DIAGNOSIS — J449 Chronic obstructive pulmonary disease, unspecified: Secondary | ICD-10-CM | POA: Diagnosis not present

## 2018-08-21 DIAGNOSIS — Z801 Family history of malignant neoplasm of trachea, bronchus and lung: Secondary | ICD-10-CM | POA: Diagnosis not present

## 2018-08-21 DIAGNOSIS — Z8041 Family history of malignant neoplasm of ovary: Secondary | ICD-10-CM | POA: Insufficient documentation

## 2018-08-21 DIAGNOSIS — E119 Type 2 diabetes mellitus without complications: Secondary | ICD-10-CM | POA: Insufficient documentation

## 2018-08-21 DIAGNOSIS — I10 Essential (primary) hypertension: Secondary | ICD-10-CM | POA: Diagnosis not present

## 2018-08-21 DIAGNOSIS — K760 Fatty (change of) liver, not elsewhere classified: Secondary | ICD-10-CM | POA: Insufficient documentation

## 2018-08-21 DIAGNOSIS — Z7982 Long term (current) use of aspirin: Secondary | ICD-10-CM | POA: Diagnosis not present

## 2018-08-21 DIAGNOSIS — R918 Other nonspecific abnormal finding of lung field: Secondary | ICD-10-CM | POA: Diagnosis not present

## 2018-08-21 DIAGNOSIS — Z87891 Personal history of nicotine dependence: Secondary | ICD-10-CM | POA: Diagnosis not present

## 2018-08-21 DIAGNOSIS — R61 Generalized hyperhidrosis: Secondary | ICD-10-CM | POA: Diagnosis not present

## 2018-08-21 DIAGNOSIS — D696 Thrombocytopenia, unspecified: Secondary | ICD-10-CM | POA: Diagnosis not present

## 2018-08-21 DIAGNOSIS — Z79899 Other long term (current) drug therapy: Secondary | ICD-10-CM | POA: Insufficient documentation

## 2018-08-21 LAB — CBC WITH DIFFERENTIAL/PLATELET
Abs Immature Granulocytes: 0.04 10*3/uL (ref 0.00–0.07)
Basophils Absolute: 0 10*3/uL (ref 0.0–0.1)
Basophils Relative: 1 %
Eosinophils Absolute: 0 10*3/uL (ref 0.0–0.5)
Eosinophils Relative: 1 %
HCT: 45.1 % (ref 39.0–52.0)
Hemoglobin: 14 g/dL (ref 13.0–17.0)
Immature Granulocytes: 1 %
Lymphocytes Relative: 18 %
Lymphs Abs: 0.8 10*3/uL (ref 0.7–4.0)
MCH: 26 pg (ref 26.0–34.0)
MCHC: 31 g/dL (ref 30.0–36.0)
MCV: 83.8 fL (ref 80.0–100.0)
Monocytes Absolute: 0.5 10*3/uL (ref 0.1–1.0)
Monocytes Relative: 10 %
Neutro Abs: 3.3 10*3/uL (ref 1.7–7.7)
Neutrophils Relative %: 69 %
Platelets: 112 10*3/uL — ABNORMAL LOW (ref 150–400)
RBC: 5.38 MIL/uL (ref 4.22–5.81)
RDW: 15.3 % (ref 11.5–15.5)
WBC: 4.6 10*3/uL (ref 4.0–10.5)
nRBC: 0 % (ref 0.0–0.2)

## 2018-08-21 LAB — COMPREHENSIVE METABOLIC PANEL
ALT: 39 U/L (ref 0–44)
AST: 20 U/L (ref 15–41)
Albumin: 3.8 g/dL (ref 3.5–5.0)
Alkaline Phosphatase: 65 U/L (ref 38–126)
Anion gap: 9 (ref 5–15)
BUN: 14 mg/dL (ref 8–23)
CALCIUM: 8.8 mg/dL — AB (ref 8.9–10.3)
CO2: 22 mmol/L (ref 22–32)
Chloride: 102 mmol/L (ref 98–111)
Creatinine, Ser: 1.08 mg/dL (ref 0.61–1.24)
GFR calc Af Amer: >60 mL/min (ref >60)
GFR calc non Af Amer: >60 mL/min (ref >60)
Glucose, Bld: 353 mg/dL — ABNORMAL HIGH (ref 70–99)
Potassium: 3.7 mmol/L (ref 3.5–5.1)
Sodium: 133 mmol/L — ABNORMAL LOW (ref 135–145)
Total Bilirubin: 1.5 mg/dL — ABNORMAL HIGH (ref 0.3–1.2)
Total Protein: 7.6 g/dL (ref 6.5–8.1)

## 2018-08-21 LAB — LACTATE DEHYDROGENASE: LDH: 153 U/L (ref 98–192)

## 2018-08-27 ENCOUNTER — Other Ambulatory Visit: Payer: Self-pay

## 2018-08-27 ENCOUNTER — Inpatient Hospital Stay (HOSPITAL_BASED_OUTPATIENT_CLINIC_OR_DEPARTMENT_OTHER): Payer: PPO | Admitting: Hematology

## 2018-08-27 ENCOUNTER — Encounter (HOSPITAL_COMMUNITY): Payer: Self-pay | Admitting: Hematology

## 2018-08-27 VITALS — BP 119/57 | HR 101 | Temp 98.5°F | Resp 18 | Wt 158.1 lb

## 2018-08-27 DIAGNOSIS — R911 Solitary pulmonary nodule: Secondary | ICD-10-CM

## 2018-08-27 DIAGNOSIS — E119 Type 2 diabetes mellitus without complications: Secondary | ICD-10-CM | POA: Diagnosis not present

## 2018-08-27 DIAGNOSIS — E785 Hyperlipidemia, unspecified: Secondary | ICD-10-CM | POA: Diagnosis not present

## 2018-08-27 DIAGNOSIS — K760 Fatty (change of) liver, not elsewhere classified: Secondary | ICD-10-CM | POA: Diagnosis not present

## 2018-08-27 DIAGNOSIS — Z87891 Personal history of nicotine dependence: Secondary | ICD-10-CM

## 2018-08-27 DIAGNOSIS — K921 Melena: Secondary | ICD-10-CM | POA: Diagnosis not present

## 2018-08-27 DIAGNOSIS — Z7982 Long term (current) use of aspirin: Secondary | ICD-10-CM | POA: Diagnosis not present

## 2018-08-27 DIAGNOSIS — K219 Gastro-esophageal reflux disease without esophagitis: Secondary | ICD-10-CM | POA: Diagnosis not present

## 2018-08-27 DIAGNOSIS — D696 Thrombocytopenia, unspecified: Secondary | ICD-10-CM | POA: Diagnosis not present

## 2018-08-27 DIAGNOSIS — R61 Generalized hyperhidrosis: Secondary | ICD-10-CM

## 2018-08-27 DIAGNOSIS — Z801 Family history of malignant neoplasm of trachea, bronchus and lung: Secondary | ICD-10-CM

## 2018-08-27 DIAGNOSIS — I10 Essential (primary) hypertension: Secondary | ICD-10-CM

## 2018-08-27 DIAGNOSIS — R918 Other nonspecific abnormal finding of lung field: Secondary | ICD-10-CM

## 2018-08-27 DIAGNOSIS — Z79899 Other long term (current) drug therapy: Secondary | ICD-10-CM

## 2018-08-27 DIAGNOSIS — J449 Chronic obstructive pulmonary disease, unspecified: Secondary | ICD-10-CM | POA: Diagnosis not present

## 2018-08-27 DIAGNOSIS — Z8041 Family history of malignant neoplasm of ovary: Secondary | ICD-10-CM

## 2018-08-27 NOTE — Progress Notes (Signed)
[facility] [place], [place] [address]   CLINIC:  Medical Oncology/Hematology  PCP:  Sinda Du, MD [place] [address] [phone]   REASON FOR VISIT: Follow-up for Thrombocytopenia  CURRENT THERAPY: Observation   INTERVAL HISTORY:  [name] 63 y.o. male returns for routine follow-up for thrombocytopenia. He reports he has a small about of bleeding with bowel movements he feels are due to hemorrhoids. He also reports having night sweats. He was recently in the hospital for ives with an unknown cause. He denies any easy bruising. Denies any nausea, vomiting, or diarrhea. Denies any new pains. Had not noticed any recent bleeding such as epistaxis, hematuria or hematochezia. Denies recent chest pain on exertion, shortness of breath on minimal exertion, pre-syncopal episodes, or palpitations. Denies any numbness or tingling in hands or feet. Denies any recent fevers, infections, or recent hospitalizations. Patient reports appetite at 100% and energy level at 75%. He is eating well and maintaining his weight at this time.    REVIEW OF SYSTEMS:  Review of Systems  HENT:   Positive for trouble swallowing.   Gastrointestinal: Positive for blood in stool and constipation.  Neurological: Positive for headaches.  All other systems reviewed and are negative.    PAST MEDICAL/SURGICAL HISTORY:  Past Medical History:  Diagnosis Date  . Anxiety   . Asthma   . Chest pain    a. 2003 Cath: nl cors.  [name] COPD (chronic obstructive pulmonary disease) ([place])   . Depression   . Diabetes mellitus   . Dyslipidemia   . Dysrhythmia   . Fracture 12/15   lower back "L-1"  . GERD (gastroesophageal reflux disease)   . Hypertension   . PONV (postoperative nausea and vomiting)   . Splenomegaly    Past Surgical History:  Procedure Laterality Date  . BIOPSY  07/26/2016   Procedure: BIOPSY;  Surgeon: [name], MD;  [facility]: [facility];   Service: Endoscopy;;  gastric  . CARDIAC CATHETERIZATION  10/22/2001   normal coronary arteriea (Dr. Domenic Moras)  . COLONOSCOPY  08/31/2011   EHO:[id number] rectal and colon polyps-treated. Single tubular adenoma. Next TCS 08/2016  . COLONOSCOPY N/A 07/26/2016   Dr. [name]: diverticulosis in sigmoid and descending colon. 6 mm benign splenic flexure. surveillance 5 yeras  . ESOPHAGOGASTRODUODENOSCOPY N/A 07/26/2016   Dr. [name]: normal esophagus, portal gastropathy, chronic gastritis, normal duodenum, screening EGD 2 years   . ESOPHAGOGASTRODUODENOSCOPY (EGD) WITH ESOPHAGEAL DILATION N/A 05/15/2013   IBB:CWUGQB esophagus-status post Venia Minks dilation/Portal gastropathy. Antral erosions-status post biopsy. extrinsic compression along lesser curvature likely secondary to splenomegaly. gastric bx benign  . HERNIA REPAIR  03/2009  . POLYPECTOMY  07/26/2016   Procedure: POLYPECTOMY;  Surgeon: [name], MD;  Location: [facility];  Service: Endoscopy;;  colon  . TRANSTHORACIC ECHOCARDIOGRAM  2011   EF 16-94%, stage 1 diastolic dysfunction, trace TR & pulm valve regurg   . VASECTOMY  1989     SOCIAL HISTORY:  Social History   Socioeconomic History  . Marital status: Married    Spouse name: Not on file  . Number of children: Not on file  . Years of education: Not on file  . Highest education level: Not on file  Occupational History  . Not on file  Social Needs  . Financial resource strain: Not on file  . Food insecurity:    Worry: Not on file    Inability: Not on file  . Transportation  needs:    Medical: Not on file    Non-medical: Not on file  Tobacco Use  . Smoking status: Former Smoker    Packs/day: 1.25    Years: 36.00    Pack years: 45.00    Types: Cigarettes    Last attempt to quit: 04/2011    Years since quitting: 7.3  . Smokeless tobacco: Former Systems developer  . Tobacco comment: quit x 7 years  Substance and Sexual Activity  . Alcohol use: No  . Drug use: No  . Sexual activity:  Not on file  Lifestyle  . Physical activity:    Days per week: Not on file    Minutes per session: Not on file  . Stress: Not on file  Relationships  . Social connections:    Talks on phone: Not on file    Gets together: Not on file    Attends religious service: Not on file    Active member of club or organization: Not on file    Attends meetings of clubs or organizations: Not on file    Relationship status: Not on file  . Intimate partner violence:    Fear of current or ex partner: Not on file    Emotionally abused: Not on file    Physically abused: Not on file    Forced sexual activity: Not on file  Other Topics Concern  . Not on file  Social History Narrative  . Not on file    FAMILY HISTORY:  Family History  Problem Relation Age of Onset  . Heart disease Mother   . Cancer Mother        lymph nodes  . Diabetes Mother   . Arthritis Other   . Lung disease Other   . Asthma Other   . Kidney disease Other   . Ovarian cancer Sister   . Diabetes Brother   . Heart disease Brother   . Hypertension Brother   . Diabetes Sister   . Diabetes Sister   . Cirrhosis Sister 57       no etoh  . Colon cancer Neg Hx     CURRENT MEDICATIONS:  Outpatient Encounter Medications as of 08/27/2018  Medication Sig  . acetaminophen (TYLENOL) 500 MG tablet Take 1,000 mg by mouth daily as needed for headache.  . albuterol (PROVENTIL) (2.5 MG/3ML) 0.083% nebulizer solution Take 3 mLs (2.5 mg total) by nebulization every 2 (two) hours as needed for wheezing.  [name] ALPRAZolam (XANAX) 0.25 MG tablet Take 0.25 mg by mouth 2 (two) times daily.   [name] aspirin EC 81 MG tablet Take 81 mg by mouth at bedtime.   [name] atorvastatin (LIPITOR) 40 MG tablet Take 1 tablet (40 mg total) by mouth daily. NEED OV.  [name] Choline Fenofibrate (FENOFIBRIC ACID) 135 MG CPDR Take 1 tablet by mouth daily. NEED OV.  . clotrimazole-betamethasone (LOTRISONE) cream Apply 1 application topically 2 (two) times daily as needed  (psoriasis).  [name] diltiazem (CARDIZEM CD) 240 MG 24 hr capsule TAKE 1 CAPSULE BY MOUTH EVERY DAY  . diphenhydrAMINE (BENADRYL) 25 MG tablet Take 2 tablets (50 mg total) by mouth every 4 (four) hours as needed for itching.  . famotidine (PEPCID) 40 MG tablet Take 0.5 tablets (20 mg total) by mouth 2 (two) times daily.  [name] gabapentin (NEURONTIN) 300 MG capsule Take 1 capsule by mouth at bedtime.  [name] HYDROcodone-acetaminophen (NORCO/VICODIN) 5-325 MG per tablet Take 1 tablet by mouth every 8 (eight) hours as needed for moderate pain. (Patient  taking differently: Take 1 tablet by mouth daily as needed for moderate pain. )  . JARDIANCE 25 MG TABS tablet Take 25 mg by mouth every evening.   . Multiple Vitamin (MULITIVITAMIN WITH MINERALS) TABS Take 1 tablet by mouth at bedtime.   Glory Rosebush DELICA LANCETS 50I MISC   . ONETOUCH VERIO test strip   . pantoprazole (PROTONIX) 40 MG tablet TAKE 1 TABLET BY MOUTH EVERY DAY  . PROAIR HFA 108 (90 Base) MCG/ACT inhaler Inhale 2 puffs into the lungs every 4 (four) hours as needed for wheezing or shortness of breath.   . SitaGLIPtin-MetFORMIN HCl (JANUMET XR) [phone] MG TB24 Take 1 tablet by mouth daily.  . tamsulosin (FLOMAX) 0.4 MG CAPS capsule Take 0.4 mg by mouth daily.  [name] tiotropium (SPIRIVA) 18 MCG inhalation capsule Place 18 mcg into inhaler and inhale daily.  . fluticasone (FLONASE) 50 MCG/ACT nasal spray Place 2 sprays into both nostrils daily as needed for allergies.   . [DISCONTINUED] cetirizine (ZYRTEC) 10 MG tablet Take 1 tablet (10 mg total) by mouth daily.  . [DISCONTINUED] sertraline (ZOLOFT) 50 MG tablet Take 1 tablet by mouth daily.   No facility-administered encounter medications on file as of 08/27/2018.     ALLERGIES:  Allergies  Allergen Reactions  . Baclofen     lethargic   . Simvastatin Nausea Only     PHYSICAL EXAM:  ECOG Performance status: 1  Vitals:   08/27/18 1029  BP: (!) 119/57  Pulse: (!) 101  Resp: 18  Temp: 98.5 F  (36.9 C)  SpO2: 95%   Filed Weights   08/27/18 1029  Weight: 158 lb 1.6 oz (71.7 kg)    Physical Exam Constitutional:      Appearance: Normal appearance. He is normal weight.  Cardiovascular:     Rate and Rhythm: Normal rate and regular rhythm.     Heart sounds: Normal heart sounds.  Pulmonary:     Effort: Pulmonary effort is normal.     Breath sounds: Normal breath sounds.  Musculoskeletal: Normal range of motion.  Skin:    General: Skin is warm and dry.  Neurological:     Mental Status: He is alert and oriented to person, place, and time. Mental status is at baseline.  Psychiatric:        Mood and Affect: Mood normal.        Behavior: Behavior normal.        Thought Content: Thought content normal.        Judgment: Judgment normal.      LABORATORY DATA:  I have reviewed the labs as listed.  CBC    Component Value Date/Time   WBC 4.6 08/21/2018 0950   RBC 5.38 08/21/2018 0950   HGB 14.0 08/21/2018 0950   HCT 45.1 08/21/2018 0950   PLT 112 (L) 08/21/2018 0950   MCV 83.8 08/21/2018 0950   MCH 26.0 08/21/2018 0950   MCHC 31.0 08/21/2018 0950   RDW 15.3 08/21/2018 0950   LYMPHSABS 0.8 08/21/2018 0950   MONOABS 0.5 08/21/2018 0950   EOSABS 0.0 08/21/2018 0950   BASOSABS 0.0 08/21/2018 0950   CMP Latest Ref Rng & Units 08/21/2018 07/29/2018 03/18/2018  Glucose 70 - 99 mg/dL 353(H) 147(H) 207(H)  BUN 8 - 23 mg/dL 14 14 20   Creatinine 0.61 - 1.24 mg/dL 1.08 0.85 1.18  Sodium 135 - 145 mmol/L 133(L) 136 141  Potassium 3.5 - 5.1 mmol/L 3.7 3.6 4.9  Chloride 98 - 111 mmol/L 102 100  105  CO2 22 - 32 mmol/L 22 25 27   Calcium 8.9 - 10.3 mg/dL 8.8(L) 9.0 9.7  Total Protein 6.5 - 8.1 g/dL 7.6 - 8.9(H)  Total Bilirubin 0.3 - 1.2 mg/dL 1.5(H) - 0.9  Alkaline Phos 38 - 126 U/L 65 - 58  AST 15 - 41 U/L 20 - 35  ALT 0 - 44 U/L 39 - 52(H)       DIAGNOSTIC IMAGING:  I have independently reviewed the scans and discussed with the patient.   I have reviewed Francene Finders, NP's note and agree with the documentation.  I personally performed a face-to-face visit, made revisions and my assessment and plan is as follows.    ASSESSMENT & PLAN:   Thrombocytopenia ([place]) 1.  Mild to moderate thrombocytopenia: -He has thrombocytopenia since 2010.  Other cell lines including white count and hemoglobin are normal. -Etiology includes liver disease versus immune mediated thrombocytopenia. - He had a PET CT scan on 08/26/2017 for follow-up of lung nodules.  This showed fatty liver with normal spleen. -Liver biopsy on 02/21/2017 shows moderate to severe steatosis. -He reports easy bruising on the upper extremities.  He takes aspirin 81 mg daily.  Has occasional hemorrhoidal bleeding. - We have reviewed his blood counts.  Platelet count is staying more or less stable. -We will plan to repeat platelet count in 4 to 5 months.  2.  Lung nodules: -Patient smoked 30 years, about 1 and half pack per day.  He quit 7 to 8 years ago. - CT chest on 12/11/2017 shows lung RADS 2 with repeat low-dose CT scan recommendation in 12 months. -I will see him back in 4 to 5 months after repeat CT scan.      Orders placed this encounter:  Orders Placed This Encounter  Procedures  . CT CHEST NODULE FOLLOW UP LOW DOSE W/O  . Lactate dehydrogenase  . Magnesium  . CBC with Differential/Platelet  . Comprehensive metabolic panel      Derek Jack, MD [place] [phone]

## 2018-08-27 NOTE — Patient Instructions (Signed)
[place] at [facility] Discharge Instructions  Follow up in 5 months with CT scan and labs    Thank you for choosing [place] at [facility] to provide your oncology and hematology care.  To afford each patient quality time with our provider, please arrive at least 15 minutes before your scheduled appointment time.   If you have a lab appointment with the [facility] please come in thru the  Main Entrance and check in at the main information desk  You need to re-schedule your appointment should you arrive 10 or more minutes late.  We strive to give you quality time with our providers, and arriving late affects you and other patients whose appointments are after yours.  Also, if you no show three or more times for appointments you may be dismissed from the clinic at the providers discretion.     Again, thank you for choosing [facility].  Our hope is that these requests will decrease the amount of time that you wait before being seen by our physicians.       _____________________________________________________________  Should you have questions after your visit to [facility], please contact our office at [phone] between the hours of 8:00 a.m. and 4:30 p.m.  Voicemails left after 4:00 p.m. will not be returned until the following business day.  For prescription refill requests, have your pharmacy contact our office and allow 72 hours.    Cancer Center Support Programs:   > Cancer Support Group  2nd Tuesday of the month 1pm-2pm, Journey Room

## 2018-08-27 NOTE — Assessment & Plan Note (Signed)
1.  Mild to moderate thrombocytopenia: -He has thrombocytopenia since 2010.  Other cell lines including white count and hemoglobin are normal. -Etiology includes liver disease versus immune mediated thrombocytopenia. - He had a PET CT scan on 08/26/2017 for follow-up of lung nodules.  This showed fatty liver with normal spleen. -Liver biopsy on 02/21/2017 shows moderate to severe steatosis. -He reports easy bruising on the upper extremities.  He takes aspirin 81 mg daily.  Has occasional hemorrhoidal bleeding. - We have reviewed his blood counts.  Platelet count is staying more or less stable. -We will plan to repeat platelet count in 4 to 5 months.  2.  Lung nodules: -Patient smoked 30 years, about 1 and half pack per day.  He quit 7 to 8 years ago. - CT chest on 12/11/2017 shows lung RADS 2 with repeat low-dose CT scan recommendation in 12 months. -I will see him back in 4 to 5 months after repeat CT scan.

## 2018-08-28 ENCOUNTER — Ambulatory Visit (HOSPITAL_COMMUNITY): Payer: PPO | Admitting: Hematology

## 2018-09-03 DIAGNOSIS — J441 Chronic obstructive pulmonary disease with (acute) exacerbation: Secondary | ICD-10-CM | POA: Diagnosis not present

## 2018-09-05 ENCOUNTER — Other Ambulatory Visit: Payer: Self-pay | Admitting: Internal Medicine

## 2018-09-15 ENCOUNTER — Other Ambulatory Visit: Payer: Self-pay | Admitting: Internal Medicine

## 2018-10-02 DIAGNOSIS — J441 Chronic obstructive pulmonary disease with (acute) exacerbation: Secondary | ICD-10-CM | POA: Diagnosis not present

## 2018-10-07 DIAGNOSIS — F419 Anxiety disorder, unspecified: Secondary | ICD-10-CM | POA: Diagnosis not present

## 2018-10-07 DIAGNOSIS — J441 Chronic obstructive pulmonary disease with (acute) exacerbation: Secondary | ICD-10-CM | POA: Diagnosis not present

## 2018-10-07 DIAGNOSIS — E119 Type 2 diabetes mellitus without complications: Secondary | ICD-10-CM | POA: Diagnosis not present

## 2018-10-07 DIAGNOSIS — J9611 Chronic respiratory failure with hypoxia: Secondary | ICD-10-CM | POA: Diagnosis not present

## 2018-10-09 ENCOUNTER — Other Ambulatory Visit: Payer: Self-pay | Admitting: Internal Medicine

## 2018-10-09 NOTE — Telephone Encounter (Signed)
Atorvastatin refilled.  

## 2018-11-02 DIAGNOSIS — J441 Chronic obstructive pulmonary disease with (acute) exacerbation: Secondary | ICD-10-CM | POA: Diagnosis not present

## 2018-11-25 DIAGNOSIS — E1165 Type 2 diabetes mellitus with hyperglycemia: Secondary | ICD-10-CM | POA: Diagnosis not present

## 2018-11-25 DIAGNOSIS — J449 Chronic obstructive pulmonary disease, unspecified: Secondary | ICD-10-CM | POA: Diagnosis not present

## 2018-11-25 DIAGNOSIS — M545 Low back pain: Secondary | ICD-10-CM | POA: Diagnosis not present

## 2018-11-25 DIAGNOSIS — J9611 Chronic respiratory failure with hypoxia: Secondary | ICD-10-CM | POA: Diagnosis not present

## 2018-12-23 DIAGNOSIS — J449 Chronic obstructive pulmonary disease, unspecified: Secondary | ICD-10-CM | POA: Diagnosis not present

## 2019-01-08 DIAGNOSIS — M545 Low back pain: Secondary | ICD-10-CM | POA: Diagnosis not present

## 2019-01-08 DIAGNOSIS — R5383 Other fatigue: Secondary | ICD-10-CM | POA: Diagnosis not present

## 2019-01-08 DIAGNOSIS — E1140 Type 2 diabetes mellitus with diabetic neuropathy, unspecified: Secondary | ICD-10-CM | POA: Diagnosis not present

## 2019-01-08 DIAGNOSIS — J449 Chronic obstructive pulmonary disease, unspecified: Secondary | ICD-10-CM | POA: Diagnosis not present

## 2019-01-12 DIAGNOSIS — M545 Low back pain: Secondary | ICD-10-CM | POA: Diagnosis not present

## 2019-01-12 DIAGNOSIS — E1140 Type 2 diabetes mellitus with diabetic neuropathy, unspecified: Secondary | ICD-10-CM | POA: Diagnosis not present

## 2019-01-12 DIAGNOSIS — J449 Chronic obstructive pulmonary disease, unspecified: Secondary | ICD-10-CM | POA: Diagnosis not present

## 2019-01-22 DIAGNOSIS — J449 Chronic obstructive pulmonary disease, unspecified: Secondary | ICD-10-CM | POA: Diagnosis not present

## 2019-02-02 ENCOUNTER — Inpatient Hospital Stay (HOSPITAL_COMMUNITY): Payer: PPO | Attending: Hematology

## 2019-02-02 ENCOUNTER — Other Ambulatory Visit: Payer: Self-pay

## 2019-02-02 ENCOUNTER — Other Ambulatory Visit (HOSPITAL_COMMUNITY): Payer: Self-pay | Admitting: Nurse Practitioner

## 2019-02-02 ENCOUNTER — Ambulatory Visit (HOSPITAL_COMMUNITY)
Admission: RE | Admit: 2019-02-02 | Discharge: 2019-02-02 | Disposition: A | Discharge status: Home / Self Care | Payer: PPO | Source: Ambulatory Visit | Attending: Nurse Practitioner | Admitting: Nurse Practitioner

## 2019-02-02 DIAGNOSIS — R911 Solitary pulmonary nodule: Secondary | ICD-10-CM

## 2019-02-02 DIAGNOSIS — Z7982 Long term (current) use of aspirin: Secondary | ICD-10-CM | POA: Diagnosis not present

## 2019-02-02 DIAGNOSIS — Z79899 Other long term (current) drug therapy: Secondary | ICD-10-CM | POA: Insufficient documentation

## 2019-02-02 DIAGNOSIS — J439 Emphysema, unspecified: Secondary | ICD-10-CM | POA: Insufficient documentation

## 2019-02-02 DIAGNOSIS — J449 Chronic obstructive pulmonary disease, unspecified: Secondary | ICD-10-CM | POA: Diagnosis not present

## 2019-02-02 DIAGNOSIS — D696 Thrombocytopenia, unspecified: Secondary | ICD-10-CM | POA: Diagnosis not present

## 2019-02-02 DIAGNOSIS — Z87891 Personal history of nicotine dependence: Secondary | ICD-10-CM | POA: Insufficient documentation

## 2019-02-02 DIAGNOSIS — I10 Essential (primary) hypertension: Secondary | ICD-10-CM | POA: Insufficient documentation

## 2019-02-02 DIAGNOSIS — I2510 Atherosclerotic heart disease of native coronary artery without angina pectoris: Secondary | ICD-10-CM | POA: Insufficient documentation

## 2019-02-02 DIAGNOSIS — I700 Atherosclerosis of aorta: Secondary | ICD-10-CM | POA: Insufficient documentation

## 2019-02-02 DIAGNOSIS — E119 Type 2 diabetes mellitus without complications: Secondary | ICD-10-CM | POA: Insufficient documentation

## 2019-02-02 LAB — CBC WITH DIFFERENTIAL/PLATELET
Abs Immature Granulocytes: 0.01 10*3/uL (ref 0.00–0.07)
Basophils Absolute: 0 10*3/uL (ref 0.0–0.1)
Basophils Relative: 1 %
Eosinophils Absolute: 0.4 10*3/uL (ref 0.0–0.5)
Eosinophils Relative: 7 %
HCT: 52.2 % — ABNORMAL HIGH (ref 39.0–52.0)
Hemoglobin: 16.1 g/dL (ref 13.0–17.0)
Immature Granulocytes: 0 %
Lymphocytes Relative: 23 %
Lymphs Abs: 1.2 10*3/uL (ref 0.7–4.0)
MCH: 25.6 pg — ABNORMAL LOW (ref 26.0–34.0)
MCHC: 30.8 g/dL (ref 30.0–36.0)
MCV: 82.9 fL (ref 80.0–100.0)
Monocytes Absolute: 0.4 10*3/uL (ref 0.1–1.0)
Monocytes Relative: 7 %
Neutro Abs: 3.3 10*3/uL (ref 1.7–7.7)
Neutrophils Relative %: 62 %
Platelets: 101 10*3/uL — ABNORMAL LOW (ref 150–400)
RBC: 6.3 MIL/uL — ABNORMAL HIGH (ref 4.22–5.81)
RDW: 14.9 % (ref 11.5–15.5)
WBC: 5.3 10*3/uL (ref 4.0–10.5)
nRBC: 0 % (ref 0.0–0.2)

## 2019-02-02 LAB — COMPREHENSIVE METABOLIC PANEL
ALT: 24 U/L (ref 0–44)
AST: 21 U/L (ref 15–41)
Albumin: 4.4 g/dL (ref 3.5–5.0)
Alkaline Phosphatase: 93 U/L (ref 38–126)
Anion gap: 9 (ref 5–15)
BUN: 19 mg/dL (ref 8–23)
CO2: 26 mmol/L (ref 22–32)
Calcium: 9.4 mg/dL (ref 8.9–10.3)
Chloride: 103 mmol/L (ref 98–111)
Creatinine, Ser: 1.05 mg/dL (ref 0.61–1.24)
GFR calc Af Amer: >60 mL/min (ref >60)
GFR calc non Af Amer: >60 mL/min (ref >60)
Glucose, Bld: 146 mg/dL — ABNORMAL HIGH (ref 70–99)
Potassium: 4.4 mmol/L (ref 3.5–5.1)
Sodium: 138 mmol/L (ref 135–145)
Total Bilirubin: 0.9 mg/dL (ref 0.3–1.2)
Total Protein: 8.6 g/dL — ABNORMAL HIGH (ref 6.5–8.1)

## 2019-02-02 LAB — MAGNESIUM: Magnesium: 2 mg/dL (ref 1.7–2.4)

## 2019-02-02 LAB — LACTATE DEHYDROGENASE: LDH: 126 U/L (ref 98–192)

## 2019-02-04 ENCOUNTER — Other Ambulatory Visit: Payer: Self-pay

## 2019-02-04 ENCOUNTER — Encounter (HOSPITAL_COMMUNITY): Payer: Self-pay | Admitting: Hematology

## 2019-02-04 ENCOUNTER — Inpatient Hospital Stay (HOSPITAL_BASED_OUTPATIENT_CLINIC_OR_DEPARTMENT_OTHER): Payer: PPO | Admitting: Hematology

## 2019-02-04 VITALS — BP 103/59 | HR 100 | Temp 97.7°F | Resp 20 | Wt 158.2 lb

## 2019-02-04 DIAGNOSIS — Z7982 Long term (current) use of aspirin: Secondary | ICD-10-CM | POA: Diagnosis not present

## 2019-02-04 DIAGNOSIS — I10 Essential (primary) hypertension: Secondary | ICD-10-CM

## 2019-02-04 DIAGNOSIS — J449 Chronic obstructive pulmonary disease, unspecified: Secondary | ICD-10-CM | POA: Diagnosis not present

## 2019-02-04 DIAGNOSIS — Z87891 Personal history of nicotine dependence: Secondary | ICD-10-CM | POA: Diagnosis not present

## 2019-02-04 DIAGNOSIS — D696 Thrombocytopenia, unspecified: Secondary | ICD-10-CM | POA: Diagnosis not present

## 2019-02-04 DIAGNOSIS — E119 Type 2 diabetes mellitus without complications: Secondary | ICD-10-CM | POA: Diagnosis not present

## 2019-02-04 DIAGNOSIS — Z79899 Other long term (current) drug therapy: Secondary | ICD-10-CM

## 2019-02-04 NOTE — Assessment & Plan Note (Signed)
1.  Mild to moderate thrombocytopenia: -He has thrombocytopenia since 2010.  White count and hemoglobin normal. - PET scan on 08/26/2017 for follow-up of lung nodules showed fatty liver with normal spleen. -Liver biopsy on 02/21/2017 showed moderate to severe steatosis. -Etiology of thrombocytopenia includes liver disease versus ITP. -He takes aspirin 81 mg daily.  He reports easy bruising on the upper extremities which is stable. -We reviewed his blood counts.  Platelet count is 101.  As there is staying stable, I recommended 69-monthfollow-up.  2.  Lung nodules: -Patient smoked 30 years, at least 1 and half pack per day.  He quit 7 to 8 years ago. - CT low-dose on 02/02/2019 was lung RADS 2.  Repeat low-dose CT in 12 months was recommended.

## 2019-02-04 NOTE — Patient Instructions (Signed)
[place] at [facility] Discharge Instructions  You were seen today by Dr. Delton Coombes. He went over your recent scan and lab results. He will see you back in 6 months for labs and follow up.   Thank you for choosing [place] at [facility] to provide your oncology and hematology care.  To afford each patient quality time with our provider, please arrive at least 15 minutes before your scheduled appointment time.   If you have a lab appointment with the [facility] please come in thru the  Main Entrance and check in at the main information desk  You need to re-schedule your appointment should you arrive 10 or more minutes late.  We strive to give you quality time with our providers, and arriving late affects you and other patients whose appointments are after yours.  Also, if you no show three or more times for appointments you may be dismissed from the clinic at the providers discretion.     Again, thank you for choosing [facility].  Our hope is that these requests will decrease the amount of time that you wait before being seen by our physicians.       _____________________________________________________________  Should you have questions after your visit to [facility], please contact our office at [phone] between the hours of 8:00 a.m. and 4:30 p.m.  Voicemails left after 4:00 p.m. will not be returned until the following business day.  For prescription refill requests, have your pharmacy contact our office and allow 72 hours.    Cancer Center Support Programs:   > Cancer Support Group  2nd Tuesday of the month 1pm-2pm, Journey Room

## 2019-02-04 NOTE — Progress Notes (Signed)
[place],  [id number]   CLINIC:  Medical Oncology/Hematology  PCP:  Sinda Du, [place] [address] [phone]   REASON FOR VISIT: Follow-up for Thrombocytopenia  CURRENT THERAPY: Observation   INTERVAL HISTORY:  [name] 63 y.o. male seen for follow-up of thrombocytopenia and lung nodules.  Denies any cough or hemoptysis.  Shortness of breath on exertion is stable.  Appetite is 100%.  Energy levels are 25%.  Numbness in the right thigh is also stable.  Denies any ER visits or hospitalizations.  Denies any bleeding per rectum or melena.  Denies any nausea vomiting diarrhea or constipation.  REVIEW OF SYSTEMS:  Review of Systems  Respiratory: Positive for shortness of breath.   Cardiovascular: Positive for leg swelling.  Neurological: Positive for numbness.  All other systems reviewed and are negative.    PAST MEDICAL/SURGICAL HISTORY:  Past Medical History:  Diagnosis Date  . Anxiety   . Asthma   . Chest pain    a. 2003 Cath: nl cors.  [name] COPD (chronic obstructive pulmonary disease) ([place])   . Depression   . Diabetes mellitus   . Dyslipidemia   . Dysrhythmia   . Fracture 12/15   lower back "L-1"  . GERD (gastroesophageal reflux disease)   . Hypertension   . PONV (postoperative nausea and vomiting)   . Splenomegaly    Past Surgical History:  Procedure Laterality Date  . BIOPSY  07/26/2016   Procedure: BIOPSY;  Surgeon: [name], MD;  [facility];  Service: Endoscopy;;  gastric  . CARDIAC CATHETERIZATION  10/22/2001   normal coronary arteriea (Dr. Domenic Moras)  . COLONOSCOPY  08/31/2011   PJA:[id number] rectal and colon polyps-treated. Single tubular adenoma. Next TCS 08/2016  . COLONOSCOPY N/A 07/26/2016   Dr. [name]: diverticulosis in sigmoid and descending colon. 6 mm benign splenic flexure. surveillance 5 yeras  . ESOPHAGOGASTRODUODENOSCOPY N/A 07/26/2016   Dr. [name]: normal  esophagus, portal gastropathy, chronic gastritis, normal duodenum, screening EGD 2 years   . ESOPHAGOGASTRODUODENOSCOPY (EGD) WITH ESOPHAGEAL DILATION N/A 05/15/2013   BHA:LPFXTK esophagus-status post Venia Minks dilation/Portal gastropathy. Antral erosions-status post biopsy. extrinsic compression along lesser curvature likely secondary to splenomegaly. gastric bx benign  . HERNIA REPAIR  03/2009  . POLYPECTOMY  07/26/2016   Procedure: POLYPECTOMY;  Surgeon: [name], MD;  Location: [facility];  Service: Endoscopy;;  colon  . TRANSTHORACIC ECHOCARDIOGRAM  2011   EF 24-09%, stage 1 diastolic dysfunction, trace TR & pulm valve regurg   . VASECTOMY  1989     SOCIAL HISTORY:  Social History   Socioeconomic History  . Marital status: Married    Spouse name: Not on file  . Number of children: Not on file  . Years of education: Not on file  . Highest education level: Not on file  Occupational History  . Not on file  Social Needs  . Financial resource strain: Not on file  . Food insecurity    Worry: Not on file    Inability: Not on file  . Transportation needs    Medical: Not on file    Non-medical: Not on file  Tobacco Use  . Smoking status: Former Smoker    Packs/day: 1.25    Years: 36.00    Pack years: 45.00    Types: Cigarettes    Quit date: 04/2011    Years since quitting: 7.8  . Smokeless tobacco: Former Systems developer  .  Tobacco comment: quit x 7 years  Substance and Sexual Activity  . Alcohol use: No  . Drug use: No  . Sexual activity: Not on file  Lifestyle  . Physical activity    Days per week: Not on file    Minutes per session: Not on file  . Stress: Not on file  Relationships  . Social Herbalist on phone: Not on file    Gets together: Not on file    Attends religious service: Not on file    Active member of club or organization: Not on file    Attends meetings of clubs or organizations: Not on file    Relationship status: Not on file  . Intimate  partner violence    Fear of current or ex partner: Not on file    Emotionally abused: Not on file    Physically abused: Not on file    Forced sexual activity: Not on file  Other Topics Concern  . Not on file  Social History Narrative  . Not on file    FAMILY HISTORY:  Family History  Problem Relation Age of Onset  . Heart disease Mother   . Cancer Mother        lymph nodes  . Diabetes Mother   . Arthritis Other   . Lung disease Other   . Asthma Other   . Kidney disease Other   . Ovarian cancer Sister   . Diabetes Brother   . Heart disease Brother   . Hypertension Brother   . Diabetes Sister   . Diabetes Sister   . Cirrhosis Sister 47       no etoh  . Colon cancer Neg Hx     CURRENT MEDICATIONS:  Outpatient Encounter Medications as of 02/04/2019  Medication Sig  . acetaminophen (TYLENOL) 500 MG tablet Take 1,000 mg by mouth daily as needed for headache.  . albuterol (PROVENTIL) (2.5 MG/3ML) 0.083% nebulizer solution Take 3 mLs (2.5 mg total) by nebulization every 2 (two) hours as needed for wheezing.  [name] ALPRAZolam (XANAX) 0.25 MG tablet Take 0.25 mg by mouth 2 (two) times daily.   [name] aspirin EC 81 MG tablet Take 81 mg by mouth at bedtime.   [name] atorvastatin (LIPITOR) 40 MG tablet TAKE 1 TABLET (40 MG TOTAL) BY MOUTH DAILY. NEED OV.  [name] diltiazem (CARDIZEM CD) 240 MG 24 hr capsule TAKE 1 CAPSULE BY MOUTH EVERY DAY  . diphenhydrAMINE (BENADRYL) 25 MG tablet Take 2 tablets (50 mg total) by mouth every 4 (four) hours as needed for itching.  . famotidine (PEPCID) 40 MG tablet Take 0.5 tablets (20 mg total) by mouth 2 (two) times daily.  . fluticasone (FLONASE) 50 MCG/ACT nasal spray Place 2 sprays into both nostrils daily as needed for allergies.   [name] gabapentin (NEURONTIN) 300 MG capsule Take 1 capsule by mouth at bedtime.  [name] HYDROcodone-acetaminophen (NORCO/VICODIN) 5-325 MG per tablet Take 1 tablet by mouth every 8 (eight) hours as needed for moderate pain. (Patient taking  differently: Take 1 tablet by mouth daily as needed for moderate pain. )  . JARDIANCE 25 MG TABS tablet Take 25 mg by mouth every evening.   . Multiple Vitamin (MULITIVITAMIN WITH MINERALS) TABS Take 1 tablet by mouth at bedtime.   Glory Rosebush DELICA LANCETS 78I MISC   . ONETOUCH VERIO test strip   . OXYGEN Inhale into the lungs. Pt uses oxygen at night and when exerting himself  . pantoprazole (PROTONIX) 40  MG tablet TAKE 1 TABLET BY MOUTH EVERY DAY  . PROAIR HFA 108 (90 Base) MCG/ACT inhaler Inhale 2 puffs into the lungs every 4 (four) hours as needed for wheezing or shortness of breath.   . sertraline (ZOLOFT) 50 MG tablet Take 50 mg by mouth daily.  . SitaGLIPtin-MetFORMIN HCl (JANUMET XR) [phone] MG TB24 Take 1 tablet by mouth daily.  . tamsulosin (FLOMAX) 0.4 MG CAPS capsule Take 0.4 mg by mouth daily.  [name] tiotropium (SPIRIVA) 18 MCG inhalation capsule Place 18 mcg into inhaler and inhale daily.  . Choline Fenofibrate (FENOFIBRIC ACID) 135 MG CPDR Take 1 tablet by mouth daily. NEED OV.  . clotrimazole-betamethasone (LOTRISONE) cream Apply 1 application topically 2 (two) times daily as needed (psoriasis).  . [DISCONTINUED] doxycycline (VIBRAMYCIN) 100 MG capsule Take 100 mg by mouth 2 (two) times daily.   No facility-administered encounter medications on file as of 02/04/2019.     ALLERGIES:  Allergies  Allergen Reactions  . Baclofen     lethargic   . Simvastatin Nausea Only     PHYSICAL EXAM:  ECOG Performance status: 1  Vitals:   02/04/19 1059  BP: (!) 103/59  Pulse: 100  Resp: 20  Temp: 97.7 F (36.5 C)  SpO2: 93%   Filed Weights   02/04/19 1059  Weight: 158 lb 3.2 oz (71.8 kg)    Physical Exam Constitutional:      Appearance: Normal appearance. He is normal weight.  Cardiovascular:     Rate and Rhythm: Normal rate and regular rhythm.     Heart sounds: Normal heart sounds.  Pulmonary:     Effort: Pulmonary effort is normal.     Breath sounds: Normal breath  sounds.  Musculoskeletal: Normal range of motion.  Skin:    General: Skin is warm and dry.  Neurological:     Mental Status: He is alert and oriented to person, place, and time. Mental status is at baseline.  Psychiatric:        Mood and Affect: Mood normal.        Behavior: Behavior normal.        Thought Content: Thought content normal.        Judgment: Judgment normal.      LABORATORY DATA:  I have reviewed the labs as listed.  CBC    Component Value Date/Time   WBC 5.3 02/02/2019 0840   RBC 6.30 (H) 02/02/2019 0840   HGB 16.1 02/02/2019 0840   HCT 52.2 (H) 02/02/2019 0840   PLT 101 (L) 02/02/2019 0840   MCV 82.9 02/02/2019 0840   MCH 25.6 (L) 02/02/2019 0840   MCHC 30.8 02/02/2019 0840   RDW 14.9 02/02/2019 0840   LYMPHSABS 1.2 02/02/2019 0840   MONOABS 0.4 02/02/2019 0840   EOSABS 0.4 02/02/2019 0840   BASOSABS 0.0 02/02/2019 0840   CMP Latest Ref Rng & Units 02/02/2019 08/21/2018 07/29/2018  Glucose 70 - 99 mg/dL 146(H) 353(H) 147(H)  BUN 8 - 23 mg/dL 19 14 14   Creatinine 0.61 - 1.24 mg/dL 1.05 1.08 0.85  Sodium 135 - 145 mmol/L 138 133(L) 136  Potassium 3.5 - 5.1 mmol/L 4.4 3.7 3.6  Chloride 98 - 111 mmol/L 103 102 100  CO2 22 - 32 mmol/L 26 22 25   Calcium 8.9 - 10.3 mg/dL 9.4 8.8(L) 9.0  Total Protein 6.5 - 8.1 g/dL 8.6(H) 7.6 -  Total Bilirubin 0.3 - 1.2 mg/dL 0.9 1.5(H) -  Alkaline Phos 38 - 126 U/L 93 65 -  AST  15 - 41 U/L 21 20 -  ALT 0 - 44 U/L 24 39 -       DIAGNOSTIC IMAGING:  I have independently reviewed the scans and discussed with the patient.    ASSESSMENT & PLAN:   Thrombocytopenia ([place]) 1.  Mild to moderate thrombocytopenia: -He has thrombocytopenia since 2010.  White count and hemoglobin normal. - PET scan on 08/26/2017 for follow-up of lung nodules showed fatty liver with normal spleen. -Liver biopsy on 02/21/2017 showed moderate to severe steatosis. -Etiology of thrombocytopenia includes liver disease versus ITP. -He takes aspirin  81 mg daily.  He reports easy bruising on the upper extremities which is stable. -We reviewed his blood counts.  Platelet count is 101.  As there is staying stable, I recommended 38-monthfollow-up.  2.  Lung nodules: -Patient smoked 30 years, at least 1 and half pack per day.  He quit 7 to 8 years ago. - CT low-dose on 02/02/2019 was lung RADS 2.  Repeat low-dose CT in 12 months was recommended.      Orders placed this encounter:  Orders Placed This Encounter  Procedures  . CBC with Differential/Platelet  . Comprehensive metabolic panel      SDerek Jack MD [phone]

## 2019-02-10 ENCOUNTER — Other Ambulatory Visit: Payer: Self-pay | Admitting: Internal Medicine

## 2019-02-19 ENCOUNTER — Other Ambulatory Visit: Payer: Self-pay | Admitting: Internal Medicine

## 2019-02-22 DIAGNOSIS — J449 Chronic obstructive pulmonary disease, unspecified: Secondary | ICD-10-CM | POA: Diagnosis not present

## 2019-03-10 ENCOUNTER — Other Ambulatory Visit (HOSPITAL_COMMUNITY): Payer: Self-pay | Admitting: Pulmonary Disease

## 2019-03-10 ENCOUNTER — Other Ambulatory Visit: Payer: Self-pay

## 2019-03-10 ENCOUNTER — Ambulatory Visit (HOSPITAL_COMMUNITY)
Admission: RE | Admit: 2019-03-10 | Discharge: 2019-03-10 | Disposition: A | Discharge status: Home / Self Care | Payer: PPO | Source: Ambulatory Visit | Attending: Pulmonary Disease | Admitting: Pulmonary Disease

## 2019-03-10 DIAGNOSIS — M79642 Pain in left hand: Secondary | ICD-10-CM | POA: Diagnosis not present

## 2019-03-10 DIAGNOSIS — M79641 Pain in right hand: Secondary | ICD-10-CM | POA: Diagnosis not present

## 2019-03-10 DIAGNOSIS — Z23 Encounter for immunization: Secondary | ICD-10-CM | POA: Diagnosis not present

## 2019-03-10 DIAGNOSIS — J449 Chronic obstructive pulmonary disease, unspecified: Secondary | ICD-10-CM | POA: Diagnosis not present

## 2019-03-10 DIAGNOSIS — F419 Anxiety disorder, unspecified: Secondary | ICD-10-CM | POA: Diagnosis not present

## 2019-03-10 DIAGNOSIS — M79643 Pain in unspecified hand: Secondary | ICD-10-CM | POA: Diagnosis not present

## 2019-03-10 DIAGNOSIS — E1140 Type 2 diabetes mellitus with diabetic neuropathy, unspecified: Secondary | ICD-10-CM | POA: Diagnosis not present

## 2019-03-22 ENCOUNTER — Other Ambulatory Visit: Payer: Self-pay | Admitting: Internal Medicine

## 2019-03-24 ENCOUNTER — Telehealth: Payer: Self-pay | Admitting: Acute Care

## 2019-03-24 DIAGNOSIS — Z122 Encounter for screening for malignant neoplasm of respiratory organs: Secondary | ICD-10-CM

## 2019-03-24 DIAGNOSIS — Z87891 Personal history of nicotine dependence: Secondary | ICD-10-CM

## 2019-03-24 NOTE — Telephone Encounter (Signed)
[name],  Please call the results of this scan to the patient. It was done in July by the new program at [facility], but they cancelled out our order and started writing their own. When I looked back, I did not see that the scan was called.   Lung RADS 2: nodules that are benign in appearance and behavior with a very low likelihood of becoming a clinically active cancer due to size or lack of growth. Recommendation per radiology is for a repeat LDCT in 12 months.  Please fax results to PCP and schedule for follow up 01/2020. Thanks so much

## 2019-03-25 DIAGNOSIS — J449 Chronic obstructive pulmonary disease, unspecified: Secondary | ICD-10-CM | POA: Diagnosis not present

## 2019-03-27 NOTE — Telephone Encounter (Signed)
I called pt and discussed Chest CT results. Pt verbalized understanding. Merceda Elks NP has already placed order for 1 yr f/u low dose ct.  Please advise on how to proceed with f/u for this pt.

## 2019-03-27 NOTE — Telephone Encounter (Signed)
Staff message sent is below: Christie Beckers, RN  Glennie Isle, NP-C; Magdalen Spatz, NP        Good afternoon,  [name] is a pt that we have been following for lung cancer screening. We seen him for shared decision in 08/2017 with CT to follow. His f/u CT's were scheduled at [facility]. We keep an ongoing tickle list of our patients to know when they are due to be scheduled. It looks like our order was possibly cancelled and replaced with a new order and done under that new order however we didn't see documentation of where the results were called to the pt. We have spoken with the pt and discussed results with him. For continuity of care we will continue to follow this pt under our program and will update the existing order that was placed for f/u LDCT. We will make sure all results are sent to pt's PCP. Thank you and let us know if you have any further questions.   Doroteo Glassman, RN  Lung Cancer Screening

## 2019-03-27 NOTE — Telephone Encounter (Signed)
This is one of the patient's that [facility]  cancelled our order and placed their own. I have talked to the RN at [facility] who did this. I told them they cannot hijack our patient's for their program.I also explained that the patient's are being screened at [facility], so there is no need to change the fact that we are following them. I also explained that there needs to be clear documentation in the patient chart of any transition of responsibility for ordering annual scan and calling the scans. . As this has been our patient, go ahead and cancel their order and place the order under my name. Send a message to the NP letting her know we have been screening the patient and we will continue to screen the patient. Let her know we have place the order for the follow up Ct and that you have called the results.We will make sure they get the results annually. Thanks  Call me if you have any questions. This was a long phone call Tuesday.

## 2019-04-02 DIAGNOSIS — M65312 Trigger thumb, left thumb: Secondary | ICD-10-CM | POA: Diagnosis not present

## 2019-04-02 DIAGNOSIS — M65311 Trigger thumb, right thumb: Secondary | ICD-10-CM | POA: Diagnosis not present

## 2019-04-06 ENCOUNTER — Other Ambulatory Visit: Payer: Self-pay | Admitting: Internal Medicine

## 2019-04-17 ENCOUNTER — Other Ambulatory Visit: Payer: Self-pay | Admitting: Internal Medicine

## 2019-04-17 NOTE — Telephone Encounter (Signed)
Please see note below. 

## 2019-04-17 NOTE — Telephone Encounter (Signed)
Pt overdue for follow up appointment. Please contact pt for future appointment.

## 2019-04-20 ENCOUNTER — Telehealth: Payer: Self-pay | Admitting: Internal Medicine

## 2019-04-20 NOTE — Telephone Encounter (Signed)
Called patient to schedule overdue followup with Dr. Debara Pickett for refills.  But, patient declined to make an appointment at this time.

## 2019-04-24 DIAGNOSIS — J449 Chronic obstructive pulmonary disease, unspecified: Secondary | ICD-10-CM | POA: Diagnosis not present

## 2019-04-29 ENCOUNTER — Other Ambulatory Visit: Payer: Self-pay | Admitting: Internal Medicine

## 2019-05-01 ENCOUNTER — Other Ambulatory Visit: Payer: Self-pay | Admitting: Internal Medicine

## 2019-05-11 DIAGNOSIS — E1165 Type 2 diabetes mellitus with hyperglycemia: Secondary | ICD-10-CM | POA: Diagnosis not present

## 2019-05-11 DIAGNOSIS — D696 Thrombocytopenia, unspecified: Secondary | ICD-10-CM | POA: Diagnosis not present

## 2019-05-11 DIAGNOSIS — I10 Essential (primary) hypertension: Secondary | ICD-10-CM | POA: Diagnosis not present

## 2019-05-11 DIAGNOSIS — J449 Chronic obstructive pulmonary disease, unspecified: Secondary | ICD-10-CM | POA: Diagnosis not present

## 2019-05-12 ENCOUNTER — Other Ambulatory Visit (HOSPITAL_COMMUNITY): Payer: Self-pay | Admitting: Respiratory Therapy

## 2019-05-12 DIAGNOSIS — J441 Chronic obstructive pulmonary disease with (acute) exacerbation: Secondary | ICD-10-CM

## 2019-05-14 DIAGNOSIS — M65311 Trigger thumb, right thumb: Secondary | ICD-10-CM | POA: Diagnosis not present

## 2019-05-14 DIAGNOSIS — J449 Chronic obstructive pulmonary disease, unspecified: Secondary | ICD-10-CM | POA: Diagnosis not present

## 2019-05-14 DIAGNOSIS — I10 Essential (primary) hypertension: Secondary | ICD-10-CM | POA: Diagnosis not present

## 2019-05-14 DIAGNOSIS — D696 Thrombocytopenia, unspecified: Secondary | ICD-10-CM | POA: Diagnosis not present

## 2019-05-14 DIAGNOSIS — M65312 Trigger thumb, left thumb: Secondary | ICD-10-CM | POA: Diagnosis not present

## 2019-05-14 DIAGNOSIS — E1165 Type 2 diabetes mellitus with hyperglycemia: Secondary | ICD-10-CM | POA: Diagnosis not present

## 2019-05-20 ENCOUNTER — Encounter (HOSPITAL_COMMUNITY): Payer: Self-pay | Admitting: *Deleted

## 2019-05-20 NOTE — Progress Notes (Signed)
Patient was referred to our clinic for LDCT for lung cancer screening.  Patient is already enrolled in the lung cancer screening program in [place].  I talked with patient and he wishes to remain in their program and be followed by them.  I advised patient that he was last scanned in July of this year, 2020.  He will not get another scan for 12 months per recommendations of radiologist.  He verbalizes understanding.

## 2019-05-25 DIAGNOSIS — J449 Chronic obstructive pulmonary disease, unspecified: Secondary | ICD-10-CM | POA: Diagnosis not present

## 2019-06-07 ENCOUNTER — Other Ambulatory Visit: Payer: Self-pay | Admitting: Internal Medicine

## 2019-06-16 ENCOUNTER — Other Ambulatory Visit (HOSPITAL_COMMUNITY)
Admission: RE | Admit: 2019-06-16 | Discharge: 2019-06-16 | Disposition: A | Discharge status: Home / Self Care | Payer: PPO | Source: Ambulatory Visit | Attending: Pulmonary Disease | Admitting: Pulmonary Disease

## 2019-06-16 ENCOUNTER — Other Ambulatory Visit: Payer: Self-pay

## 2019-06-16 DIAGNOSIS — Z01812 Encounter for preprocedural laboratory examination: Secondary | ICD-10-CM | POA: Insufficient documentation

## 2019-06-16 DIAGNOSIS — Z20828 Contact with and (suspected) exposure to other viral communicable diseases: Secondary | ICD-10-CM | POA: Insufficient documentation

## 2019-06-16 LAB — SARS CORONAVIRUS 2 (TAT 6-24 HRS): SARS Coronavirus 2: NEGATIVE

## 2019-06-18 ENCOUNTER — Ambulatory Visit (HOSPITAL_COMMUNITY)
Admission: RE | Admit: 2019-06-18 | Discharge: 2019-06-18 | Disposition: A | Discharge status: Home / Self Care | Payer: PPO | Source: Ambulatory Visit | Attending: Pulmonary Disease | Admitting: Pulmonary Disease

## 2019-06-18 ENCOUNTER — Other Ambulatory Visit: Payer: Self-pay

## 2019-06-18 DIAGNOSIS — J441 Chronic obstructive pulmonary disease with (acute) exacerbation: Secondary | ICD-10-CM | POA: Diagnosis not present

## 2019-06-18 LAB — PULMONARY FUNCTION TEST
DL/VA % pred: 75 %
DL/VA: 3.18 ml/min/mmHg/L
DLCO unc % pred: 48 %
DLCO unc: 11.91 ml/min/mmHg
FEF 25-75 Post: 0.34 L/sec
FEF 25-75 Pre: 0.36 L/sec
FEF2575-%Change-Post: -5 %
FEF2575-%Pred-Post: 13 %
FEF2575-%Pred-Pre: 14 %
FEV1-%Change-Post: 0 %
FEV1-%Pred-Post: 35 %
FEV1-%Pred-Pre: 35 %
FEV1-Post: 1.12 L
FEV1-Pre: 1.12 L
FEV1FVC-%Change-Post: 0 %
FEV1FVC-%Pred-Pre: 61 %
FEV6-%Change-Post: -1 %
FEV6-%Pred-Post: 52 %
FEV6-%Pred-Pre: 53 %
FEV6-Post: 2.08 L
FEV6-Pre: 2.12 L
FEV6FVC-%Change-Post: -2 %
FEV6FVC-%Pred-Post: 90 %
FEV6FVC-%Pred-Pre: 92 %
FVC-%Change-Post: 0 %
FVC-%Pred-Post: 57 %
FVC-%Pred-Pre: 57 %
FVC-Post: 2.42 L
FVC-Pre: 2.41 L
Post FEV1/FVC ratio: 46 %
Post FEV6/FVC ratio: 86 %
Pre FEV1/FVC ratio: 46 %
Pre FEV6/FVC Ratio: 88 %
RV % pred: 140 %
RV: 3 L
TLC % pred: 86 %
TLC: 5.52 L

## 2019-06-18 MED ORDER — ALBUTEROL SULFATE (2.5 MG/3ML) 0.083% IN NEBU
2.5000 mg | INHALATION_SOLUTION | Freq: Once | RESPIRATORY_TRACT | Status: AC
  Administered 2019-06-18: 2.5 mg via RESPIRATORY_TRACT

## 2019-06-24 DIAGNOSIS — J449 Chronic obstructive pulmonary disease, unspecified: Secondary | ICD-10-CM | POA: Diagnosis not present

## 2019-07-02 ENCOUNTER — Other Ambulatory Visit: Payer: Self-pay | Admitting: Internal Medicine

## 2019-07-25 DIAGNOSIS — J449 Chronic obstructive pulmonary disease, unspecified: Secondary | ICD-10-CM | POA: Diagnosis not present

## 2019-07-27 ENCOUNTER — Other Ambulatory Visit: Payer: Self-pay

## 2019-07-28 MED ORDER — PANTOPRAZOLE SODIUM 40 MG PO TBEC: 40.0000 mg | DELAYED_RELEASE_TABLET | Freq: Every day | ORAL | 3 refills | Status: DC

## 2019-07-30 DIAGNOSIS — I10 Essential (primary) hypertension: Secondary | ICD-10-CM | POA: Diagnosis not present

## 2019-07-30 DIAGNOSIS — F411 Generalized anxiety disorder: Secondary | ICD-10-CM | POA: Diagnosis not present

## 2019-07-30 DIAGNOSIS — E785 Hyperlipidemia, unspecified: Secondary | ICD-10-CM | POA: Diagnosis not present

## 2019-07-30 DIAGNOSIS — F1310 Sedative, hypnotic or anxiolytic abuse, uncomplicated: Secondary | ICD-10-CM | POA: Diagnosis not present

## 2019-07-30 DIAGNOSIS — Z9981 Dependence on supplemental oxygen: Secondary | ICD-10-CM | POA: Diagnosis not present

## 2019-07-30 DIAGNOSIS — J449 Chronic obstructive pulmonary disease, unspecified: Secondary | ICD-10-CM | POA: Diagnosis not present

## 2019-07-30 DIAGNOSIS — E1165 Type 2 diabetes mellitus with hyperglycemia: Secondary | ICD-10-CM | POA: Diagnosis not present

## 2019-07-30 DIAGNOSIS — Z79899 Other long term (current) drug therapy: Secondary | ICD-10-CM | POA: Diagnosis not present

## 2019-08-10 ENCOUNTER — Other Ambulatory Visit: Payer: Self-pay | Admitting: Internal Medicine

## 2019-08-11 ENCOUNTER — Other Ambulatory Visit: Payer: Self-pay

## 2019-08-11 ENCOUNTER — Inpatient Hospital Stay (HOSPITAL_COMMUNITY): Payer: PPO | Attending: Hematology

## 2019-08-11 DIAGNOSIS — Z833 Family history of diabetes mellitus: Secondary | ICD-10-CM | POA: Diagnosis not present

## 2019-08-11 DIAGNOSIS — E785 Hyperlipidemia, unspecified: Secondary | ICD-10-CM | POA: Diagnosis not present

## 2019-08-11 DIAGNOSIS — F329 Major depressive disorder, single episode, unspecified: Secondary | ICD-10-CM | POA: Diagnosis not present

## 2019-08-11 DIAGNOSIS — Z7951 Long term (current) use of inhaled steroids: Secondary | ICD-10-CM | POA: Diagnosis not present

## 2019-08-11 DIAGNOSIS — R918 Other nonspecific abnormal finding of lung field: Secondary | ICD-10-CM | POA: Diagnosis not present

## 2019-08-11 DIAGNOSIS — Z7982 Long term (current) use of aspirin: Secondary | ICD-10-CM | POA: Diagnosis not present

## 2019-08-11 DIAGNOSIS — Z7984 Long term (current) use of oral hypoglycemic drugs: Secondary | ICD-10-CM | POA: Diagnosis not present

## 2019-08-11 DIAGNOSIS — I10 Essential (primary) hypertension: Secondary | ICD-10-CM | POA: Insufficient documentation

## 2019-08-11 DIAGNOSIS — Z8249 Family history of ischemic heart disease and other diseases of the circulatory system: Secondary | ICD-10-CM | POA: Insufficient documentation

## 2019-08-11 DIAGNOSIS — Z8041 Family history of malignant neoplasm of ovary: Secondary | ICD-10-CM | POA: Insufficient documentation

## 2019-08-11 DIAGNOSIS — Z79899 Other long term (current) drug therapy: Secondary | ICD-10-CM | POA: Diagnosis not present

## 2019-08-11 DIAGNOSIS — Z8261 Family history of arthritis: Secondary | ICD-10-CM | POA: Diagnosis not present

## 2019-08-11 DIAGNOSIS — F419 Anxiety disorder, unspecified: Secondary | ICD-10-CM | POA: Insufficient documentation

## 2019-08-11 DIAGNOSIS — E119 Type 2 diabetes mellitus without complications: Secondary | ICD-10-CM | POA: Insufficient documentation

## 2019-08-11 DIAGNOSIS — J45909 Unspecified asthma, uncomplicated: Secondary | ICD-10-CM | POA: Diagnosis not present

## 2019-08-11 DIAGNOSIS — D696 Thrombocytopenia, unspecified: Secondary | ICD-10-CM | POA: Diagnosis not present

## 2019-08-11 DIAGNOSIS — J449 Chronic obstructive pulmonary disease, unspecified: Secondary | ICD-10-CM | POA: Diagnosis not present

## 2019-08-11 DIAGNOSIS — Z87891 Personal history of nicotine dependence: Secondary | ICD-10-CM | POA: Insufficient documentation

## 2019-08-11 LAB — CBC WITH DIFFERENTIAL/PLATELET
Abs Immature Granulocytes: 0.02 10*3/uL (ref 0.00–0.07)
Basophils Absolute: 0 10*3/uL (ref 0.0–0.1)
Basophils Relative: 1 %
Eosinophils Absolute: 0.2 10*3/uL (ref 0.0–0.5)
Eosinophils Relative: 4 %
HCT: 53.6 % — ABNORMAL HIGH (ref 39.0–52.0)
Hemoglobin: 16.4 g/dL (ref 13.0–17.0)
Immature Granulocytes: 0 %
Lymphocytes Relative: 23 %
Lymphs Abs: 1.2 10*3/uL (ref 0.7–4.0)
MCH: 25.5 pg — ABNORMAL LOW (ref 26.0–34.0)
MCHC: 30.6 g/dL (ref 30.0–36.0)
MCV: 83.2 fL (ref 80.0–100.0)
Monocytes Absolute: 0.4 10*3/uL (ref 0.1–1.0)
Monocytes Relative: 8 %
Neutro Abs: 3.3 10*3/uL (ref 1.7–7.7)
Neutrophils Relative %: 64 %
Platelets: 110 10*3/uL — ABNORMAL LOW (ref 150–400)
RBC: 6.44 MIL/uL — ABNORMAL HIGH (ref 4.22–5.81)
RDW: 14.9 % (ref 11.5–15.5)
WBC: 5.2 10*3/uL (ref 4.0–10.5)
nRBC: 0 % (ref 0.0–0.2)

## 2019-08-11 LAB — COMPREHENSIVE METABOLIC PANEL
ALT: 30 U/L (ref 0–44)
AST: 20 U/L (ref 15–41)
Albumin: 4.1 g/dL (ref 3.5–5.0)
Alkaline Phosphatase: 119 U/L (ref 38–126)
Anion gap: 8 (ref 5–15)
BUN: 15 mg/dL (ref 8–23)
CO2: 28 mmol/L (ref 22–32)
Calcium: 9.2 mg/dL (ref 8.9–10.3)
Chloride: 106 mmol/L (ref 98–111)
Creatinine, Ser: 0.92 mg/dL (ref 0.61–1.24)
GFR calc Af Amer: >60 mL/min (ref >60)
GFR calc non Af Amer: >60 mL/min (ref >60)
Glucose, Bld: 154 mg/dL — ABNORMAL HIGH (ref 70–99)
Potassium: 4.7 mmol/L (ref 3.5–5.1)
Sodium: 142 mmol/L (ref 135–145)
Total Bilirubin: 1 mg/dL (ref 0.3–1.2)
Total Protein: 8.9 g/dL — ABNORMAL HIGH (ref 6.5–8.1)

## 2019-08-18 ENCOUNTER — Encounter (HOSPITAL_COMMUNITY): Payer: Self-pay | Admitting: Nurse Practitioner

## 2019-08-18 ENCOUNTER — Inpatient Hospital Stay (HOSPITAL_COMMUNITY): Payer: PPO | Admitting: Nurse Practitioner

## 2019-08-18 ENCOUNTER — Ambulatory Visit (HOSPITAL_COMMUNITY): Payer: PPO | Admitting: Hematology

## 2019-08-18 ENCOUNTER — Encounter (HOSPITAL_COMMUNITY): Payer: Self-pay | Admitting: *Deleted

## 2019-08-18 ENCOUNTER — Other Ambulatory Visit: Payer: Self-pay | Admitting: Internal Medicine

## 2019-08-18 ENCOUNTER — Other Ambulatory Visit: Payer: Self-pay

## 2019-08-18 VITALS — BP 118/70 | HR 92 | Temp 97.5°F | Resp 18 | Wt 160.5 lb

## 2019-08-18 DIAGNOSIS — R911 Solitary pulmonary nodule: Secondary | ICD-10-CM

## 2019-08-18 DIAGNOSIS — D696 Thrombocytopenia, unspecified: Secondary | ICD-10-CM | POA: Diagnosis not present

## 2019-08-18 NOTE — Progress Notes (Signed)
[place], [place] [address]   CLINIC:  Medical Oncology/Hematology  PCP:  Sinda Du, MD No address on file None   REASON FOR VISIT: Follow-up for thrombocytopenia  CURRENT THERAPY: Observation   INTERVAL HISTORY:  [name] 64 y.o. male returns for routine follow-up for thrombocytopenia.  Patient reports he does have a light pink sputum occasionally when he coughs hard.  He is also having a few nosebleeds from his oxygen.  Patient denies any other bleeding.  He denies any bright red bleeding per rectum or melena.  He does have easy bruising on his upper extremities. Denies any nausea, vomiting, or diarrhea. Denies any new pains. Had not noticed any recent bleeding such as epistaxis, hematuria or hematochezia. Denies recent chest pain on exertion, shortness of breath on minimal exertion, pre-syncopal episodes, or palpitations. Denies any numbness or tingling in hands or feet. Denies any recent fevers, infections, or recent hospitalizations. Patient reports appetite at 100% and energy level at 75%.  He is eating well maintain his weight at this time.     REVIEW OF SYSTEMS:  Review of Systems  Constitutional: Positive for fatigue.  HENT:   Positive for mouth sores.   Respiratory: Positive for cough, hemoptysis and shortness of breath.   All other systems reviewed and are negative.    PAST MEDICAL/SURGICAL HISTORY:  Past Medical History:  Diagnosis Date  . Anxiety   . Asthma   . Chest pain    a. 2003 Cath: nl cors.  [name] COPD (chronic obstructive pulmonary disease) ([place])   . Depression   . Diabetes mellitus   . Dyslipidemia   . Dysrhythmia   . Fracture 12/15   lower back "L-1"  . GERD (gastroesophageal reflux disease)   . Hypertension   . PONV (postoperative nausea and vomiting)   . Splenomegaly    Past Surgical History:  Procedure Laterality Date  . BIOPSY  07/26/2016   Procedure: BIOPSY;  Surgeon: [name], MD;   [facility];  Service: Endoscopy;;  gastric  . CARDIAC CATHETERIZATION  10/22/2001   normal coronary arteriea (Dr. Domenic Moras)  . COLONOSCOPY  08/31/2011   EUM:PNTIRWER rectal and colon polyps-treated. Single tubular adenoma. Next TCS 08/2016  . COLONOSCOPY N/A 07/26/2016   Dr. [name]: diverticulosis in sigmoid and descending colon. 6 mm benign splenic flexure. surveillance 5 yeras  . ESOPHAGOGASTRODUODENOSCOPY N/A 07/26/2016   Dr. [name]: normal esophagus, portal gastropathy, chronic gastritis, normal duodenum, screening EGD 2 years   . ESOPHAGOGASTRODUODENOSCOPY (EGD) WITH ESOPHAGEAL DILATION N/A 05/15/2013   XVQ:[id number] esophagus-status post Venia Minks dilation/Portal gastropathy. Antral erosions-status post biopsy. extrinsic compression along lesser curvature likely secondary to splenomegaly. gastric bx benign  . HERNIA REPAIR  03/2009  . POLYPECTOMY  07/26/2016   Procedure: POLYPECTOMY;  Surgeon: [name], MD;  Location: [facility];  Service: Endoscopy;;  colon  . TRANSTHORACIC ECHOCARDIOGRAM  2011   EF 19-50%, stage 1 diastolic dysfunction, trace TR & pulm valve regurg   . VASECTOMY  1989     SOCIAL HISTORY:  Social History   Socioeconomic History  . Marital status: Married    Spouse name: Not on file  . Number of children: Not on file  . Years of education: Not on file  . Highest education level: Not on file  Occupational History  . Not on file  Tobacco Use  . Smoking status: Former Smoker    Packs/day: 1.25    Years: 36.00  Pack years: 45.00    Types: Cigarettes    Quit date: 04/2011    Years since quitting: 8.3  . Smokeless tobacco: Former Systems developer  . Tobacco comment: quit x 7 years  Substance and Sexual Activity  . Alcohol use: No  . Drug use: No  . Sexual activity: Not on file  Other Topics Concern  . Not on file  Social History Narrative  . Not on file   Social Determinants of Health   Financial Resource Strain:   . Difficulty of Paying Living  Expenses: Not on file  Food Insecurity:   . Worried About Charity fundraiser in the Last Year: Not on file  . Ran Out of Food in the Last Year: Not on file  Transportation Needs:   . Lack of Transportation (Medical): Not on file  . Lack of Transportation (Non-Medical): Not on file  Physical Activity:   . Days of Exercise per Week: Not on file  . Minutes of Exercise per Session: Not on file  Stress:   . Feeling of Stress : Not on file  Social Connections:   . Frequency of Communication with Friends and Family: Not on file  . Frequency of Social Gatherings with Friends and Family: Not on file  . Attends Religious Services: Not on file  . Active Member of Clubs or Organizations: Not on file  . Attends Archivist Meetings: Not on file  . Marital Status: Not on file  Intimate Partner Violence:   . Fear of Current or Ex-Partner: Not on file  . Emotionally Abused: Not on file  . Physically Abused: Not on file  . Sexually Abused: Not on file    FAMILY HISTORY:  Family History  Problem Relation Age of Onset  . Heart disease Mother   . Cancer Mother        lymph nodes  . Diabetes Mother   . Arthritis Other   . Lung disease Other   . Asthma Other   . Kidney disease Other   . Ovarian cancer Sister   . Diabetes Brother   . Heart disease Brother   . Hypertension Brother   . Diabetes Sister   . Diabetes Sister   . Cirrhosis Sister 36       no etoh  . Colon cancer Neg Hx     CURRENT MEDICATIONS:  Outpatient Encounter Medications as of 08/18/2019  Medication Sig  . acetaminophen (TYLENOL) 500 MG tablet Take 1,000 mg by mouth daily as needed for headache.  . albuterol (PROVENTIL) (2.5 MG/3ML) 0.083% nebulizer solution Take 3 mLs (2.5 mg total) by nebulization every 2 (two) hours as needed for wheezing.  [name] ALPRAZolam (XANAX) 0.25 MG tablet Take 0.25 mg by mouth 2 (two) times daily.   [name] aspirin EC 81 MG tablet Take 81 mg by mouth at bedtime.   [name] atorvastatin (LIPITOR) 40  MG tablet Take 1 tablet (40 mg total) by mouth daily. Please make annual appt with Dr. Debara Pickett for refills. [phone]. 3rd& final attempt.  . budesonide-formoterol (SYMBICORT) 160-4.5 MCG/ACT inhaler Inhale 2 puffs into the lungs 2 (two) times daily.  . clotrimazole-betamethasone (LOTRISONE) cream Apply 1 application topically 2 (two) times daily as needed (psoriasis).  [name] diltiazem (CARDIZEM CD) 240 MG 24 hr capsule TAKE 1 CAPSULE BY MOUTH EVERY DAY  . diphenhydrAMINE (BENADRYL) 25 MG tablet Take 2 tablets (50 mg total) by mouth every 4 (four) hours as needed for itching.  . gabapentin (NEURONTIN) 300 MG capsule  Take 1 capsule by mouth at bedtime.  [name] HYDROcodone-acetaminophen (NORCO/VICODIN) 5-325 MG per tablet Take 1 tablet by mouth every 8 (eight) hours as needed for moderate pain. (Patient taking differently: Take 1 tablet by mouth daily as needed for moderate pain. )  . JARDIANCE 25 MG TABS tablet Take 25 mg by mouth every evening.   . Multiple Vitamin (MULITIVITAMIN WITH MINERALS) TABS Take 1 tablet by mouth at bedtime.   Glory Rosebush DELICA LANCETS 92Z MISC   . ONETOUCH VERIO test strip   . OXYGEN Inhale into the lungs. Pt uses oxygen at night and when exerting himself  . pantoprazole (PROTONIX) 40 MG tablet Take 1 tablet (40 mg total) by mouth daily before breakfast.  . PROAIR HFA 108 (90 Base) MCG/ACT inhaler Inhale 2 puffs into the lungs every 4 (four) hours as needed for wheezing or shortness of breath.   . sertraline (ZOLOFT) 50 MG tablet Take 50 mg by mouth daily.  . SitaGLIPtin-MetFORMIN HCl (JANUMET XR) [phone] MG TB24 Take 1 tablet by mouth daily.  . SitaGLIPtin-MetFORMIN HCl [phone] MG TB24 Take by mouth.  . tamsulosin (FLOMAX) 0.4 MG CAPS capsule Take 0.4 mg by mouth daily.  [name] tiotropium (SPIRIVA) 18 MCG inhalation capsule Place 18 mcg into inhaler and inhale daily.  . [DISCONTINUED] fluticasone (FLONASE) 50 MCG/ACT nasal spray Place 2 sprays into both nostrils daily as needed  for allergies.   . [DISCONTINUED] Choline Fenofibrate (FENOFIBRIC ACID) 135 MG CPDR TAKE 1 TABLET BY MOUTH DAILY. NEED OFFICE VISIT  . [DISCONTINUED] famotidine (PEPCID) 40 MG tablet Take 0.5 tablets (20 mg total) by mouth 2 (two) times daily.   No facility-administered encounter medications on file as of 08/18/2019.    ALLERGIES:  Allergies  Allergen Reactions  . Baclofen     lethargic   . Simvastatin Nausea Only     PHYSICAL EXAM:  ECOG Performance status: 1  Vitals:   08/18/19 1000  BP: 118/70  Pulse: 92  Resp: 18  Temp: (!) 97.5 F (36.4 C)  SpO2: 97%   Filed Weights   08/18/19 1000  Weight: 160 lb 8 oz (72.8 kg)    Physical Exam Constitutional:      Appearance: Normal appearance. He is normal weight.  Cardiovascular:     Rate and Rhythm: Normal rate and regular rhythm.     Heart sounds: Normal heart sounds.  Pulmonary:     Effort: Pulmonary effort is normal.     Breath sounds: Normal breath sounds.  Abdominal:     General: Bowel sounds are normal.     Palpations: Abdomen is soft.  Musculoskeletal:        General: Normal range of motion.  Skin:    General: Skin is warm.  Neurological:     Mental Status: He is alert and oriented to person, place, and time. Mental status is at baseline.  Psychiatric:        Mood and Affect: Mood normal.        Behavior: Behavior normal.        Thought Content: Thought content normal.        Judgment: Judgment normal.      LABORATORY DATA:  I have reviewed the labs as listed.  CBC    Component Value Date/Time   WBC 5.2 08/11/2019 0845   RBC 6.44 (H) 08/11/2019 0845   HGB 16.4 08/11/2019 0845   HCT 53.6 (H) 08/11/2019 0845   PLT 110 (L) 08/11/2019 0845   MCV 83.2 08/11/2019  0845   MCH 25.5 (L) 08/11/2019 0845   MCHC 30.6 08/11/2019 0845   RDW 14.9 08/11/2019 0845   LYMPHSABS 1.2 08/11/2019 0845   MONOABS 0.4 08/11/2019 0845   EOSABS 0.2 08/11/2019 0845   BASOSABS 0.0 08/11/2019 0845   CMP Latest Ref Rng &  Units 08/11/2019 02/02/2019 08/21/2018  Glucose 70 - 99 mg/dL 154(H) 146(H) 353(H)  BUN 8 - 23 mg/dL 15 19 14   Creatinine 0.61 - 1.24 mg/dL 0.92 1.05 1.08  Sodium 135 - 145 mmol/L 142 138 133(L)  Potassium 3.5 - 5.1 mmol/L 4.7 4.4 3.7  Chloride 98 - 111 mmol/L 106 103 102  CO2 22 - 32 mmol/L 28 26 22   Calcium 8.9 - 10.3 mg/dL 9.2 9.4 8.8(L)  Total Protein 6.5 - 8.1 g/dL 8.9(H) 8.6(H) 7.6  Total Bilirubin 0.3 - 1.2 mg/dL 1.0 0.9 1.5(H)  Alkaline Phos 38 - 126 U/L 119 93 65  AST 15 - 41 U/L 20 21 20   ALT 0 - 44 U/L 30 24 39     I personally performed a face-to-face visit.  All questions were answered to patient's stated satisfaction. Encouraged patient to call with any new concerns or questions before his next visit to the cancer center and we can certain see him sooner, if needed.     ASSESSMENT & PLAN:   Thrombocytopenia ([place]) 1.  Mild to moderate thrombocytopenia: -He has thrombocytopenia since 2010.  White count and hemoglobin are normal. -PET scan on 08/26/2017 for follow-up lung nodule showed fatty liver with normal spleen. -Liver biopsy on 02/21/2017 showed moderate to severe steatosis. -Etiology of thrombocytopenia includes liver disease versus ITP. -He takes an aspirin 81 mg daily.  He reports easy bruising on the upper extremities which is stable. -Labs done on 08/11/2019 showed platelets 110. -He will follow-up in 6 months with repeat labs.  2.  Lung nodules: -Patient smoked 30 years, at least 1-1/2 packs/day.  He quit 7 to 8 years ago. -CT low-dose on 02/02/2019 was lung RADS 2.  Repeat CT in 12 months. -He reports mild pink sputum when he coughs hard.  He does report occasional nosebleeds.  He wears oxygen 24/7. -We will order his repeat CT scan with his follow-up appointment.      Orders placed this encounter:  Orders Placed This Encounter  Procedures  . CT CHEST NODULE FOLLOW UP LOW DOSE WO CM  . CBC with Differential/Platelet  . Comprehensive metabolic panel  .  Vitamin B12  . VITAMIN D 25 Hydroxy (Vit-D Deficiency, Fractures)  . Lactate dehydrogenase      Christopher Finders, FNP-C [place] [phone]

## 2019-08-18 NOTE — Patient Instructions (Signed)
[place] [facility] Discharge Instructions  Follow up in 6 months with labs and CT scan   Thank you for choosing [facility] at [facility] to provide your oncology and hematology care.  To afford each patient quality time with our provider, please arrive at least 15 minutes before your scheduled appointment time.   If you have a lab appointment with the [facility] please come in thru the Main Entrance and check in at the main information desk.  You need to re-schedule your appointment should you arrive 10 or more minutes late.  We strive to give you quality time with our providers, and arriving late affects you and other patients whose appointments are after yours.  Also, if you no show three or more times for appointments you may be dismissed from the clinic at the providers discretion.     Again, thank you for choosing [facility].  Our hope is that these requests will decrease the amount of time that you wait before being seen by our physicians.       _____________________________________________________________  Should you have questions after your visit to [facility], please contact our office at [phone] between the hours of 8:00 a.m. and 4:30 p.m.  Voicemails left after 4:00 p.m. will not be returned until the following business day.  For prescription refill requests, have your pharmacy contact our office and allow 72 hours.    Due to Covid, you will need to wear a mask upon entering the hospital. If you do not have a mask, a mask will be given to you at the Main Entrance upon arrival. For doctor visits, patients may have 1 support person with them. For treatment visits, patients can not have anyone with them due to social distancing guidelines and our immunocompromised population.

## 2019-08-18 NOTE — Assessment & Plan Note (Signed)
1.  Mild to moderate thrombocytopenia: -He has thrombocytopenia since 2010.  White count and hemoglobin are normal. -PET scan on 08/26/2017 for follow-up lung nodule showed fatty liver with normal spleen. -Liver biopsy on 02/21/2017 showed moderate to severe steatosis. -Etiology of thrombocytopenia includes liver disease versus ITP. -He takes an aspirin 81 mg daily.  He reports easy bruising on the upper extremities which is stable. -Labs done on 08/11/2019 showed platelets 110. -He will follow-up in 6 months with repeat labs.  2.  Lung nodules: -Patient smoked 30 years, at least 1-1/2 packs/day.  He quit 7 to 8 years ago. -CT low-dose on 02/02/2019 was lung RADS 2.  Repeat CT in 12 months. -He reports mild pink sputum when he coughs hard.  He does report occasional nosebleeds.  He wears oxygen 24/7. -We will order his repeat CT scan with his follow-up appointment.

## 2019-08-18 NOTE — Progress Notes (Signed)
Patient was seen today for office visit.  He was referred to our LDCT lung screening program.  He is already enrolled in the screening program through [employer].  I will defer to them to schedule and follow up with him yearly.  A message has been sent to the program coordinator Eric Form, NP.

## 2019-08-25 DIAGNOSIS — J449 Chronic obstructive pulmonary disease, unspecified: Secondary | ICD-10-CM | POA: Diagnosis not present

## 2019-08-26 DIAGNOSIS — E785 Hyperlipidemia, unspecified: Secondary | ICD-10-CM | POA: Diagnosis not present

## 2019-08-26 DIAGNOSIS — I10 Essential (primary) hypertension: Secondary | ICD-10-CM | POA: Diagnosis not present

## 2019-08-26 DIAGNOSIS — E1165 Type 2 diabetes mellitus with hyperglycemia: Secondary | ICD-10-CM | POA: Diagnosis not present

## 2019-08-26 DIAGNOSIS — Z125 Encounter for screening for malignant neoplasm of prostate: Secondary | ICD-10-CM | POA: Diagnosis not present

## 2019-08-26 DIAGNOSIS — Z79899 Other long term (current) drug therapy: Secondary | ICD-10-CM | POA: Diagnosis not present

## 2019-09-03 DIAGNOSIS — Z9981 Dependence on supplemental oxygen: Secondary | ICD-10-CM | POA: Diagnosis not present

## 2019-09-03 DIAGNOSIS — E1165 Type 2 diabetes mellitus with hyperglycemia: Secondary | ICD-10-CM | POA: Diagnosis not present

## 2019-09-03 DIAGNOSIS — Z0001 Encounter for general adult medical examination with abnormal findings: Secondary | ICD-10-CM | POA: Diagnosis not present

## 2019-09-03 DIAGNOSIS — F411 Generalized anxiety disorder: Secondary | ICD-10-CM | POA: Diagnosis not present

## 2019-09-03 DIAGNOSIS — I10 Essential (primary) hypertension: Secondary | ICD-10-CM | POA: Diagnosis not present

## 2019-09-03 DIAGNOSIS — J449 Chronic obstructive pulmonary disease, unspecified: Secondary | ICD-10-CM | POA: Diagnosis not present

## 2019-09-20 DIAGNOSIS — F419 Anxiety disorder, unspecified: Secondary | ICD-10-CM | POA: Diagnosis not present

## 2019-09-20 DIAGNOSIS — Z5181 Encounter for therapeutic drug level monitoring: Secondary | ICD-10-CM | POA: Diagnosis not present

## 2019-09-22 DIAGNOSIS — J449 Chronic obstructive pulmonary disease, unspecified: Secondary | ICD-10-CM | POA: Diagnosis not present

## 2019-10-01 DIAGNOSIS — Z9981 Dependence on supplemental oxygen: Secondary | ICD-10-CM | POA: Diagnosis not present

## 2019-10-01 DIAGNOSIS — F411 Generalized anxiety disorder: Secondary | ICD-10-CM | POA: Diagnosis not present

## 2019-10-01 DIAGNOSIS — J449 Chronic obstructive pulmonary disease, unspecified: Secondary | ICD-10-CM | POA: Diagnosis not present

## 2019-10-01 DIAGNOSIS — J9611 Chronic respiratory failure with hypoxia: Secondary | ICD-10-CM | POA: Diagnosis not present

## 2019-10-21 DIAGNOSIS — F419 Anxiety disorder, unspecified: Secondary | ICD-10-CM | POA: Diagnosis not present

## 2019-10-23 DIAGNOSIS — J449 Chronic obstructive pulmonary disease, unspecified: Secondary | ICD-10-CM | POA: Diagnosis not present

## 2019-10-29 DIAGNOSIS — I10 Essential (primary) hypertension: Secondary | ICD-10-CM | POA: Diagnosis not present

## 2019-10-29 DIAGNOSIS — F411 Generalized anxiety disorder: Secondary | ICD-10-CM | POA: Diagnosis not present

## 2019-10-29 DIAGNOSIS — E1165 Type 2 diabetes mellitus with hyperglycemia: Secondary | ICD-10-CM | POA: Diagnosis not present

## 2019-11-16 DIAGNOSIS — J449 Chronic obstructive pulmonary disease, unspecified: Secondary | ICD-10-CM | POA: Diagnosis not present

## 2019-11-16 DIAGNOSIS — Z9981 Dependence on supplemental oxygen: Secondary | ICD-10-CM | POA: Diagnosis not present

## 2019-11-16 DIAGNOSIS — J9611 Chronic respiratory failure with hypoxia: Secondary | ICD-10-CM | POA: Diagnosis not present

## 2019-11-22 DIAGNOSIS — J449 Chronic obstructive pulmonary disease, unspecified: Secondary | ICD-10-CM | POA: Diagnosis not present

## 2019-11-25 DIAGNOSIS — J9611 Chronic respiratory failure with hypoxia: Secondary | ICD-10-CM | POA: Diagnosis not present

## 2019-11-25 DIAGNOSIS — R040 Epistaxis: Secondary | ICD-10-CM | POA: Diagnosis not present

## 2019-11-25 DIAGNOSIS — J440 Chronic obstructive pulmonary disease with acute lower respiratory infection: Secondary | ICD-10-CM | POA: Diagnosis not present

## 2019-11-25 DIAGNOSIS — F411 Generalized anxiety disorder: Secondary | ICD-10-CM | POA: Diagnosis not present

## 2019-12-09 ENCOUNTER — Ambulatory Visit (INDEPENDENT_AMBULATORY_CARE_PROVIDER_SITE_OTHER): Payer: PPO | Admitting: Internal Medicine

## 2019-12-09 ENCOUNTER — Other Ambulatory Visit: Payer: Self-pay

## 2019-12-09 ENCOUNTER — Encounter: Payer: Self-pay | Admitting: Internal Medicine

## 2019-12-09 DIAGNOSIS — R05 Cough: Secondary | ICD-10-CM

## 2019-12-09 DIAGNOSIS — M545 Low back pain: Secondary | ICD-10-CM | POA: Diagnosis not present

## 2019-12-09 DIAGNOSIS — J449 Chronic obstructive pulmonary disease, unspecified: Secondary | ICD-10-CM | POA: Diagnosis not present

## 2019-12-09 DIAGNOSIS — R058 Other specified cough: Secondary | ICD-10-CM

## 2019-12-09 MED ORDER — PREDNISONE 10 MG PO TABS: ORAL_TABLET | ORAL | 0 refills | Status: DC

## 2019-12-09 MED ORDER — SPIRIVA RESPIMAT 2.5 MCG/ACT IN AERS: 2.0000 | INHALATION_SPRAY | Freq: Every day | RESPIRATORY_TRACT | 11 refills | Status: DC

## 2019-12-09 MED ORDER — SPIRIVA RESPIMAT 2.5 MCG/ACT IN AERS: 2.0000 | INHALATION_SPRAY | Freq: Every day | RESPIRATORY_TRACT | 0 refills | Status: DC

## 2019-12-09 NOTE — Patient Instructions (Addendum)
Continue protonix 40 mg Take 30-60 min before first meal of the day   GERD (REFLUX)  is an extremely common cause of respiratory symptoms just like yours , many times with no obvious heartburn at all.    It can be treated with medication, but also with lifestyle changes including elevation of the head of your bed (ideally with 6 -8inch blocks under the headboard of your bed),  Smoking cessation, avoidance of late meals, excessive alcohol, and avoid fatty foods, chocolate, peppermint, colas, red wine, and acidic juices such as orange juice.  NO MINT OR MENTHOL PRODUCTS SO NO COUGH DROPS  USE SUGARLESS CANDY INSTEAD (Jolley ranchers or Stover's or Life Savers) or even ice chips will also do - the key is to swallow to prevent all throat clearing. NO OIL BASED VITAMINS - use powdered substitutes.  Avoid fish oil when coughing.  Plan A = Automatic = Always=    Budesonide/formoterol (symbicort ) 2 puffs first thing in am and 12 hours later and use spiriva x 2 pffs after the morning symbicort (ok to change back to the powder after 2 weeks but will need to stop it if you start coughing   Work on inhaler technique:  relax and gently blow all the way out then take a nice smooth deep breath back in, triggering the inhaler at same time you start breathing in.  Hold for up to 5 seconds if you can. Blow symbicort out thru nose. Rinse and gargle with water when done      Plan B = Backup (to supplement plan A, not to replace it) Only use your albuterol inhaler as a rescue medication to be used if you can't catch your breath by resting or doing a relaxed purse lip breathing pattern.  - The less you use it, the better it will work when you need it. - Ok to use the inhaler up to 2 puffs  every 4 hours if you must but call for appointment if use goes up over your usual need - Don't leave home without it !!  (think of it like the spare tire for your car)   Plan C = Crisis (instead of Plan B but only if Plan B stops  working) - only use your albuterol nebulizer if you first try Plan B and it fails to help > ok to use the nebulizer up to every 4 hours but if start needing it regularly call for immediate appointment   Make sure you check your oxygen saturations at highest level of activity to be sure it stays over 90% and adjust upward to maintain this level if needed but remember to turn it back to previous settings when you stop (to conserve your supply).    Prednisone 10 mg take  4 each am x 2 days,   2 each am x 2 days,  1 each am x 2 days and stop   For cough > mucinex dm 1200 mg every 12 hours as needed    Would wait another 2 weeks before you get your first shot for covid 19    Please schedule a follow up visit in 3 months but call sooner if needed - bring your inhalers with you

## 2019-12-09 NOTE — Progress Notes (Signed)
[name], male    DOB: May 30, 1956     MRN: [id number]   Brief patient profile:  33 yowm quit smoking 2012 with COPD GOLD III criteria 06/17/20 previously followed by Dr [name] referred back to pulmonary clinic 12/09/2019 in [employer].   History of Present Illness  12/09/2019  Pulmonary/ 1st office eval/Vishnu Moeller  symbicort 160/spiriva  Chief Complaint  Patient presents with  . Pulmonary Consult    Former patient of Dr [name].  Breathing has been worse over the past few wks. Recently treated with Doxy per PCP for COPD exacerbation. He is occ coughing up some green sputum.   flare 11/23/19  rx doxy 5/19 x 7 days  Dyspnea:  Baseline uses [facility] parking / MMR[employer]3 = can't walk 100 yards even at a slow pace at a flat grade s stopping due to sob  On 2lpm  Cough: improved less purulent purulent  Sleep: wakes up coughing since onset /sometimes uses saba proair  SABA use: rarely daytime  No obvious day to day or daytime variability or assoc excess/ purulent sputum or mucus plugs or hemoptysis or cp or chest tightness, subjective wheeze or overt sinus or hb symptoms.     Also denies any obvious fluctuation of symptoms with weather or environmental changes or other aggravating or alleviating factors except as outlined above   No unusual exposure hx or h/o childhood pna/ asthma or knowledge of premature birth.  Current Allergies, Complete Past Medical History, Past Surgical History, Family History, and Social History were reviewed in [employer] record.  ROS  The following are not active complaints unless bolded Hoarseness, sore throat, dysphagia, dental problems, itching, sneezing,  nasal congestion or discharge of excess mucus or purulent secretions, ear ache,   fever, chills, sweats, unintended wt loss or wt gain, classically pleuritic or exertional cp,  orthopnea pnd or arm/hand swelling  or leg swelling, presyncope, palpitations, abdominal pain, anorexia, nausea, vomiting,  diarrhea  or change in bowel habits or change in bladder habits, change in stools or change in urine, dysuria, hematuria,  rash, arthralgias, visual complaints, headache, numbness, weakness or ataxia or problems with walking or coordination,  change in mood or  memory.             Past Medical History:  Diagnosis Date  . Anxiety   . Asthma   . Chest pain    a. 2003 Cath: nl cors.  [name] COPD (chronic obstructive pulmonary disease) ([place])   . Depression   . Diabetes mellitus   . Dyslipidemia   . Dysrhythmia   . Fracture 12/15   lower back "L-1"  . GERD (gastroesophageal reflux disease)   . Hypertension   . PONV (postoperative nausea and vomiting)   . Splenomegaly     Outpatient Medications Prior to Visit  Medication Sig Dispense Refill  . acetaminophen (TYLENOL) 500 MG tablet Take 1,000 mg by mouth daily as needed for headache.    . albuterol (PROVENTIL) (2.5 MG/3ML) 0.083% nebulizer solution Take 3 mLs (2.5 mg total) by nebulization every 2 (two) hours as needed for wheezing. 75 mL 12  . ALPRAZolam (XANAX) 0.25 MG tablet Take 0.25 mg by mouth 2 (two) times daily.     [name] aspirin EC 81 MG tablet Take 81 mg by mouth at bedtime.     [name] atorvastatin (LIPITOR) 40 MG tablet Take 1 tablet (40 mg total) by mouth daily. Please make annual appt with Dr. Debara Pickett for refills. [phone]. 3rd& final attempt.  15 tablet 0  . budesonide-formoterol (SYMBICORT) 160-4.5 MCG/ACT inhaler Inhale 2 puffs into the lungs 2 (two) times daily.    [name] diltiazem (CARDIZEM CD) 240 MG 24 hr capsule TAKE 1 CAPSULE BY MOUTH EVERY DAY 60 capsule 0  . gabapentin (NEURONTIN) 300 MG capsule Take 1 capsule by mouth at bedtime.  5  . HYDROcodone-acetaminophen (NORCO/VICODIN) 5-325 MG per tablet Take 1 tablet by mouth every 8 (eight) hours as needed for moderate pain. (Patient taking differently: Take 1 tablet by mouth daily as needed for moderate pain. ) 90 tablet 0  . JARDIANCE 25 MG TABS tablet Take 25 mg by mouth every  evening.   3  . Multiple Vitamin (MULITIVITAMIN WITH MINERALS) TABS Take 1 tablet by mouth at bedtime.     Glory Rosebush DELICA LANCETS 43X MISC     . ONETOUCH VERIO test strip     . OXYGEN Inhale into the lungs. Pt uses oxygen at night and when exerting himself    . pantoprazole (PROTONIX) 40 MG tablet Take 1 tablet (40 mg total) by mouth daily before breakfast. 90 tablet 3  . PROAIR HFA 108 (90 Base) MCG/ACT inhaler Inhale 2 puffs into the lungs every 4 (four) hours as needed for wheezing or shortness of breath.   12  . sertraline (ZOLOFT) 50 MG tablet Take 50 mg by mouth daily.    . SitaGLIPtin-MetFORMIN HCl (JANUMET XR) [id number] MG TB24 Take 1 tablet by mouth daily.    . SitaGLIPtin-MetFORMIN HCl [id number] MG TB24 Take by mouth.    . tamsulosin (FLOMAX) 0.4 MG CAPS capsule Take 0.4 mg by mouth daily.    [name] tiotropium (SPIRIVA) 18 MCG inhalation capsule Place 18 mcg into inhaler and inhale daily.    [name] VITAMIN D PO Take 1 tablet by mouth daily.    . clotrimazole-betamethasone (LOTRISONE) cream Apply 1 application topically 2 (two) times daily as needed (psoriasis).    . diphenhydrAMINE (BENADRYL) 25 MG tablet Take 2 tablets (50 mg total) by mouth every 4 (four) hours as needed for itching. 30 tablet 0   No facility-administered medications prior to visit.     Objective:     BP 128/66 (BP Location: Left Arm, Cuff Size: Normal)   Pulse 83   Temp 97.6 F (36.4 C) (Temporal)   Ht 5' 7"  (1.702 m)   Wt 162 lb (73.5 kg)   SpO2 96% Comment: on 2 lpm  BMI 25.37 kg/m   SpO2: 96 %(on 2 lpm) O2 Type: Continuous O2 O2 Flow Rate (L/min): 2 L/min  slt hoarse mod obese wm nad   HEENT : pt wearing mask not removed for exam due to covid - 19 concerns.    NECK :  without JVD/Nodes/TM/ nl carotid upstrokes bilaterally   LUNGS: no acc muscle use,  Mild barrel  contour chest wall with bilateral  Distant bs s audible wheeze and  without cough on insp or exp maneuvers  and mild  Hyperresonant  to   percussion bilaterally     CV:  RRR  no s3 or murmur or increase in P2, and no edema   ABD:  soft and nontender with pos end  insp Hoover's  in the supine position. No bruits or organomegaly appreciated, bowel sounds nl  MS:   Nl gait/  ext warm without deformities, calf tenderness, cyanosis or clubbing No obvious joint restrictions   SKIN: warm and dry without lesions    NEURO:  alert, approp, nl sensorium with  no motor or cerebellar deficits apparent.        I personally reviewed images and agree with radiology impression as follows:   Chest   LDSCT 02/02/2019 Lung-RADS 2, benign appearance or behavior. Continue annual screening with low-dose chest CT without contrast in 12 months.        Assessment   No problem-specific Assessment & Plan notes found for this encounter.     Christinia Gully, MD 12/09/2019

## 2019-12-10 ENCOUNTER — Encounter: Payer: Self-pay | Admitting: Internal Medicine

## 2019-12-10 DIAGNOSIS — R058 Other specified cough: Secondary | ICD-10-CM | POA: Insufficient documentation

## 2019-12-10 NOTE — Assessment & Plan Note (Signed)
Upper airway cough syndrome (previously labeled PNDS),  is so named because it's frequently impossible to sort out how much is  CR/sinusitis with freq throat clearing (which can be related to primary GERD)   vs  causing  secondary (" extra esophageal")  GERD from wide swings in gastric pressure that occur with throat clearing, often  promoting self use of mint and menthol lozenges that reduce the lower esophageal sphincter tone and exacerbate the problem further in a cyclical fashion.   These are the same pts (now being labeled as having "irritable larynx syndrome" by some cough centers) who not infrequently have a history of having failed to tolerate ace inhibitors,  dry powder inhalers or biphosphonates or report having atypical/extraesophageal reflux symptoms that don't respond to standard doses of PPI  and are easily confused as having aecopd or asthma flares by even experienced allergists/ pulmonologists (myself included).  Rec: change dpi to smi x 2 weeks and if cough recurs when changes back then needs to permanently change to smi version or breztri rx  Max gred rx/ diet.   F/u in 3 months, sooner if does not resolve on above rx          Each maintenance medication was reviewed in detail including emphasizing most importantly the difference between maintenance and prns and under what circumstances the prns are to be triggered using an action plan format where appropriate.  Total time for H and P, chart review, counseling, teaching device and generating customized AVS unique to this new patient office visit / charting = 45 min

## 2019-12-10 NOTE — Assessment & Plan Note (Signed)
Quit smoking 2012 - PFT's  06/18/2019  FEV1 1.12 (35 % ) ratio 0.46  p 0 % improvement from saba p ? prior to study with DLCO  11.91 (48%) corrects to 3.18 (754%)  for alv volume and FV curve classic concavity   - 12/09/2019  After extensive coaching inhaler device,  effectiveness =    90% with smi so try spiriva respimat 2 bid instead of DPI     Group D in terms of symptom/risk and laba/lama/ICS  therefore appropriate rx at this point >>>  symb 160 / spiriva approp though might be a good candidate for breztri depending on his insurance formulary.   To treat mild aecopd rec   Max rx for gerd Prednisone 10 mg take  4 each am x 2 days,   2 each am x 2 days,  1 each am x 2 days and stop  Delay covid vaccination x 2 weeks and continue isolation in meantime.

## 2019-12-21 ENCOUNTER — Emergency Department (HOSPITAL_COMMUNITY): Payer: PPO

## 2019-12-21 ENCOUNTER — Other Ambulatory Visit: Payer: Self-pay

## 2019-12-21 ENCOUNTER — Emergency Department (HOSPITAL_COMMUNITY)
Admission: EM | Admit: 2019-12-21 | Discharge: 2019-12-21 | Disposition: A | Discharge status: Home / Self Care | Payer: PPO | Attending: Emergency Medicine | Admitting: Emergency Medicine

## 2019-12-21 ENCOUNTER — Encounter (HOSPITAL_COMMUNITY): Payer: Self-pay

## 2019-12-21 DIAGNOSIS — W228XXA Striking against or struck by other objects, initial encounter: Secondary | ICD-10-CM | POA: Diagnosis not present

## 2019-12-21 DIAGNOSIS — J45909 Unspecified asthma, uncomplicated: Secondary | ICD-10-CM | POA: Diagnosis not present

## 2019-12-21 DIAGNOSIS — Y999 Unspecified external cause status: Secondary | ICD-10-CM | POA: Diagnosis not present

## 2019-12-21 DIAGNOSIS — J441 Chronic obstructive pulmonary disease with (acute) exacerbation: Secondary | ICD-10-CM | POA: Insufficient documentation

## 2019-12-21 DIAGNOSIS — Y939 Activity, unspecified: Secondary | ICD-10-CM | POA: Insufficient documentation

## 2019-12-21 DIAGNOSIS — Z7984 Long term (current) use of oral hypoglycemic drugs: Secondary | ICD-10-CM | POA: Diagnosis not present

## 2019-12-21 DIAGNOSIS — S92511A Displaced fracture of proximal phalanx of right lesser toe(s), initial encounter for closed fracture: Secondary | ICD-10-CM | POA: Diagnosis not present

## 2019-12-21 DIAGNOSIS — E119 Type 2 diabetes mellitus without complications: Secondary | ICD-10-CM | POA: Insufficient documentation

## 2019-12-21 DIAGNOSIS — S90934A Unspecified superficial injury of right lesser toe(s), initial encounter: Secondary | ICD-10-CM | POA: Diagnosis present

## 2019-12-21 DIAGNOSIS — Z7982 Long term (current) use of aspirin: Secondary | ICD-10-CM | POA: Insufficient documentation

## 2019-12-21 DIAGNOSIS — S92531A Displaced fracture of distal phalanx of right lesser toe(s), initial encounter for closed fracture: Secondary | ICD-10-CM | POA: Insufficient documentation

## 2019-12-21 DIAGNOSIS — I10 Essential (primary) hypertension: Secondary | ICD-10-CM | POA: Diagnosis not present

## 2019-12-21 DIAGNOSIS — M7989 Other specified soft tissue disorders: Secondary | ICD-10-CM | POA: Diagnosis not present

## 2019-12-21 DIAGNOSIS — Z87891 Personal history of nicotine dependence: Secondary | ICD-10-CM | POA: Diagnosis not present

## 2019-12-21 DIAGNOSIS — Y929 Unspecified place or not applicable: Secondary | ICD-10-CM | POA: Insufficient documentation

## 2019-12-21 MED ORDER — LIDOCAINE-EPINEPHRINE (PF) 2 %-1:200000 IJ SOLN
10.0000 mL | Freq: Once | INTRAMUSCULAR | Status: AC
  Administered 2019-12-21: 10 mL
  Filled 2019-12-21: qty 10

## 2019-12-21 NOTE — ED Provider Notes (Signed)
[facility] EMERGENCY DEPARTMENT Provider Note   CSN: [id number] Arrival date & time: 12/21/19  0734     History Chief Complaint  Patient presents with  . Toe Pain    [name] is a 64 y.o. male.  HPI    64 year old male comes in a chief complaint of toe pain. Patient has history of COPD, diabetes.  He reports that his small toe struck the heel of his 34-year-old and immediately started hurting.  There is deformity.  He has no numbness or tingling.  No other injuries.  Pain is subjected to toe only.  Past Medical History:  Diagnosis Date  . Anxiety   . Asthma   . Chest pain    a. 2003 Cath: nl cors.  [name] COPD (chronic obstructive pulmonary disease) ([place])   . Depression   . Diabetes mellitus   . Dyslipidemia   . Dysrhythmia   . Fracture 12/15   lower back "L-1"  . GERD (gastroesophageal reflux disease)   . Hypertension   . PONV (postoperative nausea and vomiting)   . Splenomegaly     Patient Active Problem List   Diagnosis Date Noted  . Upper airway cough syndrome 12/10/2019  . Hives 07/30/2018  . Full body hives 07/29/2018  . Idiopathic angioedema 07/29/2018  . COPD GOLD III 07/29/2018  . Mycobacterium avium infection ([place]) 12/18/2017  . Goiter 10/25/2017  . Suspected Mycobacterial infectious disease 10/25/2017  . COPD exacerbation ([place]) 10/19/2017  . Dehydration 10/19/2017  . NASH (nonalcoholic steatohepatitis) 08/05/2017  . Liver fibrosis 01/31/2017  . Hx of adenomatous colonic polyps 07/05/2016  . Hyperproteinemia 09/05/2014  . HTN (hypertension) 10/14/2013  . Dyslipidemia 10/14/2013  . Type 2 diabetes mellitus without complication, without long-term current use of insulin ([place]) 10/14/2013  . Leukopenia 08/08/2013  . Thrombocytopenia ([place]) 08/08/2013  . Lower abdominal pain 07/06/2013  . Abnormal LFTs 07/06/2013  . GERD (gastroesophageal reflux disease) 07/06/2013  . Unspecified constipation 07/06/2013  . Abdominal bloating 04/21/2013  .  Abdominal pain, generalized 04/21/2013  . Non-alcoholic fatty liver disease 04/21/2013  . Splenomegaly 04/21/2013  . Lumbago 11/26/2011  . Muscle weakness (generalized) 11/26/2011  . Bilateral leg pain 11/06/2011  . Back pain with radiation 11/06/2011  . DDD (degenerative disc disease), lumbar 11/06/2011    Past Surgical History:  Procedure Laterality Date  . BIOPSY  07/26/2016   Procedure: BIOPSY;  Surgeon: [name], MD;  Location: [facility] ENDO SUITE;  Service: Endoscopy;;  gastric  . CARDIAC CATHETERIZATION  10/22/2001   normal coronary arteriea (Dr. Domenic Moras)  . COLONOSCOPY  08/31/2011   PXT:GGYIRSWN rectal and colon polyps-treated. Single tubular adenoma. Next TCS 08/2016  . COLONOSCOPY N/A 07/26/2016   Dr. [name]: diverticulosis in sigmoid and descending colon. 6 mm benign splenic flexure. surveillance 5 yeras  . ESOPHAGOGASTRODUODENOSCOPY N/A 07/26/2016   Dr. [name]: normal esophagus, portal gastropathy, chronic gastritis, normal duodenum, screening EGD 2 years   . ESOPHAGOGASTRODUODENOSCOPY (EGD) WITH ESOPHAGEAL DILATION N/A 05/15/2013   IOE:VOJJKK esophagus-status post Venia Minks dilation/Portal gastropathy. Antral erosions-status post biopsy. extrinsic compression along lesser curvature likely secondary to splenomegaly. gastric bx benign  . HERNIA REPAIR  03/2009  . POLYPECTOMY  07/26/2016   Procedure: POLYPECTOMY;  Surgeon: [name], MD;  Location: [facility] END[facility] SU[facility];  Service: Endoscopy;;  colon  . TRANSTHORACIC ECHOCARDIOGRAM  2011   EF 93-81%, stage 1 diastolic dysfunction, trace TR & pulm valve regurg   . [place] History  Problem Relation Age of Onset  . Heart disease Mother   . Cancer Mother        lymph nodes  . Diabetes Mother   . Arthritis Other   . Lung disease Other   . Asthma Other   . Kidney disease Other   . Ovarian cancer Sister   . Diabetes Brother   . Heart disease Brother   . Hypertension Brother   . Diabetes Sister   . Diabetes  Sister   . Cirrhosis Sister 25       no etoh  . Colon cancer Neg Hx     Social History   Tobacco Use  . Smoking status: Former Smoker    Packs/day: 1.25    Years: 36.00    Pack years: 45.00    Types: Cigarettes    Quit date: 04/2011    Years since quitting: 8.7  . Smokeless tobacco: Former Network engineer  . Vaping Use: Never used  Substance Use Topics  . Alcohol use: No  . Drug use: No    Home Medications Prior to Admission medications   Medication Sig Start Date End Date Taking? Authorizing Provider  acetaminophen (TYLENOL) 500 MG tablet Take 1,000 mg by mouth daily as needed for headache.    [provider]  albuterol (PROVENTIL) (2.5 MG/3ML) 0.083% nebulizer solution Take 3 mLs (2.5 mg total) by nebulization every 2 (two) hours as needed for wheezing. 11/02/17   Sinda Du, MD  ALPRAZolam [name]) 0.25 MG tablet Take 0.25 mg by mouth 2 (two) times daily.     [provider]  aspirin EC 81 MG tablet Take 81 mg by mouth at bedtime.     [provider]  atorvastatin (LIPITOR) 40 MG tablet Take 1 tablet (40 mg total) by mouth daily. Please make annual appt with Dr. Debara Pickett for refills. [phone]. 3rd& final attempt. 08/10/19   Hilty, [name], MD  budesonide-formoterol (SYMBICORT) 160-4.5 MCG/ACT inhaler Inhale 2 puffs into the lungs 2 (two) times daily.    [provider]  diltiazem (CARDIZEM CD) 240 MG 24 hr capsule TAKE 1 CAPSULE BY MOUTH EVERY DAY 07/08/18   Hilty, [name], MD  gabapentin (NEURONTIN) 300 MG capsule Take 1 capsule by mouth at bedtime. 07/18/17   [provider]  HYDROcodone-acetaminophen (NORCO/VICODIN) 5-325 MG per tablet Take 1 tablet by mouth every 8 (eight) hours as needed for moderate pain. Patient taking differently: Take 1 tablet by mouth daily as needed for moderate pain.  11/09/14   [name], MD  JARDIANCE 25 MG TABS tablet Take 25 mg by mouth every evening.  01/01/17   [provider]   Multiple Vitamin (MULITIVITAMIN WITH MINERALS) TABS Take 1 tablet by mouth at bedtime.     [provider]  Jonetta Speak LANCETS 77L [place]  08/26/18   [provider]  Roma Schanz test strip  08/26/18   [provider]  OXYGEN Inhale into the lungs. Pt uses oxygen at night and when exerting himself    [provider]  pantoprazole (PROTONIX) 40 MG tablet Take 1 tablet (40 mg total) by mouth daily before breakfast. 07/28/19   [name], PA-C  predniSONE (DELTASONE) 10 MG tablet Take  4 each am x 2 days,   2 each am x 2 days,  1 each am x 2 days and stop 12/09/19   Tanda Rockers, MD  PROAIR HFA 108 [phone] Base) MCG/ACT inhaler Inhale 2 puffs into  the lungs every 4 (four) hours as needed for wheezing or shortness of breath.  08/29/15   [provider]  sertraline (ZOLOFT) 50 MG tablet Take 50 mg by mouth daily.    [provider]  SitaGLIPtin-MetFORMIN HCl (JANUMET XR) [id number] MG TB24 Take 1 tablet by mouth daily.    [provider]  SitaGLIPtin-MetFORMIN HCl 845 [id number] MG TB24 Take by mouth.    [provider]  tamsulosin (FLOMAX) 0.4 MG CAPS capsule Take 0.4 mg by mouth daily.    [provider]  Tiotropium Bromide Monohydrate (SPIRIVA RESPIMAT) 2.5 MCG/ACT AERS Inhale 2 puffs into the lungs daily. 12/09/19   Tanda Rockers, MD  VITAMIN D PO Take 1 tablet by mouth daily.    [provider]    Allergies    Baclofen and Simvastatin  Review of Systems   Review of Systems  Constitutional: Positive for activity change.  Musculoskeletal: Positive for arthralgias.  Skin: Negative for wound.  Neurological: Negative for numbness.  Hematological: Does not bruise/bleed easily.    Physical Exam Updated Vital Signs BP 113/61 (BP Location: Left Arm)   Pulse 91   Temp 98.6 F (37 C) (Oral)   Resp 18   Ht 5' 7"  (1.702 m)   Wt 73.5 kg   SpO2 91%   BMI 25.37 kg/m   Physical Exam Vitals and nursing note  reviewed.  Pulmonary:     Effort: Pulmonary effort is normal.  Musculoskeletal:        General: Swelling, tenderness and deformity present.     Comments: Angulated/deformed small right toe.  Skin:    Findings: No bruising or erythema.  Neurological:     Mental Status: He is alert.     Sensory: No sensory deficit.     ED Results / Procedures / Treatments   Labs (all labs ordered are listed, but only abnormal results are displayed) Labs Reviewed - No data to display  EKG None  Radiology DG Toe 5th Right  Result Date: 12/21/2019 CLINICAL DATA:  Post reduction for fracture EXAM: RIGHT FIFTH TOE: 3 V COMPARISON:  December 21, 2019 study obtained earlier in the day FINDINGS: Frontal, oblique, and lateral views obtained. The obliquely oriented fracture through the midportion of the fifth proximal phalanx show significantly less angulation compared to pre reduction study. Mild lateral angulation distally remains in this area. No new fracture. No dislocation. No joint space narrowing or erosion. IMPRESSION: Obliquely oriented fracture midportion fifth proximal phalanx with less lateral angulation distally compared to pre reduction study. No new fracture. No dislocation. No appreciable arthropathy. Electronically Signed   By: Lowella Grip III M.D.   On: 12/21/2019 10:27   DG Toe 5th Right  Result Date: 12/21/2019 CLINICAL DATA:  Pain/deformity struck fifth toe against grandson's foot, 1 hour ago EXAM: RIGHT FIFTH TOE COMPARISON:  None FINDINGS: Three views of the RIGHT fifth toe show fracture of the proximal phalanx with comminution and apex plantar and medial angulation with marked deformity of the toe turn to approximately 60 degrees off axis with respect to the distal fracture fragment. Fracture may extend into the joint, interphalangeal joint best exhibited on the oblique view likely the plantar and medial aspect. Question also fracture of the distal phalanx also on the oblique view. Soft  tissue swelling over the site. IMPRESSION: Displaced and angulated fracture of the proximal phalanx of the fifth toe. Intra-articular extension is suggested as is a fracture of the distal phalanx. Electronically Signed   By:  Zetta Bills M.D.   On: 12/21/2019 08:13    Procedures .Nerve Block  Date/Time: 12/21/2019 10:46 AM Performed by: Varney Biles, MD Authorized by: Varney Biles, MD   Consent:    Consent obtained:  Verbal   Consent given by:  Patient   Risks discussed:  Infection, nerve damage, pain, unsuccessful block, bleeding and swelling   Alternatives discussed:  No treatment Universal protocol:    Procedure explained and questions answered to patient or proxy's satisfaction: yes     Site marked: yes     Time out called: yes     Patient identity confirmed:  Arm band Indications:    Indications:  Procedural anesthesia Location:    Body area:  Lower extremity   Lower extremity nerve blocked: Digital/Toe.   Laterality:  Right Pre-procedure details:    Skin preparation:  2% chlorhexidine Skin anesthesia (see MAR for exact dosages):    Skin anesthesia method:  Local infiltration   Local anesthetic:  Lidocaine 1% WITH epi Procedure details (see MAR for exact dosages):    Block needle gauge:  25 G   Anesthetic injected:  Lidocaine 1% WITH epi Post-procedure details:    Dressing:  Sterile dressing   Outcome:  Anesthesia achieved   Patient tolerance of procedure:  Tolerated well, no immediate complications Reduction of fracture  Date/Time: 12/21/2019 10:48 AM Performed by: Varney Biles, MD Authorized by: Varney Biles, MD  Consent: Verbal consent obtained. Risks and benefits: risks, benefits and alternatives were discussed Consent given by: patient Patient understanding: patient states understanding of the procedure being performed Imaging studies: imaging studies available Patient identity confirmed: arm band Time out: Immediately prior to procedure a "time  out" was called to verify the correct patient, procedure, equipment, support staff and site/side marked as required.  Sedation: Patient sedated: no  Patient tolerance: patient tolerated the procedure well with no immediate complications Comments: Displaced and angulated fracture of the proximal phalanx of the  fifth toe. Intra-articular extension is suggested as is a fracture  of the distal phalanx.   Fracture reduced  .Ortho Injury Treatment  Date/Time: 12/21/2019 10:50 AM Performed by: Varney Biles, MD Authorized by: Varney Biles, MD   Consent:    Consent obtained:  Verbal   Consent given by:  Patient   Alternatives discussed:  No treatmentSplint type: static finger Supplies used: aluminum splint and cotton padding Post-procedure neurovascular assessment: post-procedure neurovascularly intact Post-procedure distal perfusion: normal Post-procedure neurological function: normal    (including critical care time)  Medications Ordered in ED Medications  lidocaine-EPINEPHrine (XYLOCAINE W/EPI) 2 %-1:200000 (PF) injection 10 mL (10 mLs Infiltration Given 12/21/19 8366)    ED Course  I have reviewed the triage vital signs and the nursing notes.  Pertinent labs & imaging results that were available during my care of the patient were reviewed by me and considered in my medical decision making (see chart for details).    MDM Rules/Calculators/A&P                          64 year old comes in a chief complaint of toe injury.  He has a displaced and angulated toe fracture that was reduced in the ER. Patient will be given orthopedic follow-up in 1 week. Cam walker boot to be applied.  Final Clinical Impression(s) / ED Diagnoses Final diagnoses:  Closed displaced fracture of distal phalanx of lesser toe of right foot, initial encounter    Rx / DC Orders ED Discharge  Orders    None       Varney Biles, MD 12/21/19 1051

## 2019-12-21 NOTE — Discharge Instructions (Addendum)
You are seen in the ER for toe fracture. The fracture has been reduced, but you will need to follow-up with the orthopedic surgeon in 1 week.  Please use cam walker boot when ambulating.  Limit your ambulation as much as you can. Take Tylenol for pain relief.

## 2019-12-21 NOTE — ED Triage Notes (Signed)
Pt reports accidentally hit r little toe on his grandson's heel.  Deformity noted to toe.

## 2019-12-23 DIAGNOSIS — J449 Chronic obstructive pulmonary disease, unspecified: Secondary | ICD-10-CM | POA: Diagnosis not present

## 2019-12-24 DIAGNOSIS — S92919S Unspecified fracture of unspecified toe(s), sequela: Secondary | ICD-10-CM | POA: Diagnosis not present

## 2019-12-24 DIAGNOSIS — F411 Generalized anxiety disorder: Secondary | ICD-10-CM | POA: Diagnosis not present

## 2019-12-28 ENCOUNTER — Other Ambulatory Visit (HOSPITAL_COMMUNITY): Payer: Self-pay | Admitting: Adult Health

## 2019-12-28 DIAGNOSIS — Z13820 Encounter for screening for osteoporosis: Secondary | ICD-10-CM

## 2019-12-30 ENCOUNTER — Encounter: Payer: Self-pay | Admitting: Orthopedic Surgery

## 2019-12-30 ENCOUNTER — Other Ambulatory Visit: Payer: Self-pay

## 2019-12-30 ENCOUNTER — Ambulatory Visit: Payer: PPO | Admitting: Orthopedic Surgery

## 2019-12-30 VITALS — BP 129/78 | HR 99 | Ht 67.0 in | Wt 162.0 lb

## 2019-12-30 DIAGNOSIS — S92511A Displaced fracture of proximal phalanx of right lesser toe(s), initial encounter for closed fracture: Secondary | ICD-10-CM | POA: Diagnosis not present

## 2019-12-30 NOTE — Progress Notes (Signed)
NEW PROBLEM//OFFICE VISIT  Chief Complaint  Patient presents with  . Toe Injury    12/21/19 toe injury right 5th toe     HPI 26 male injured his right small toe June 14.  He was running a walking behind his grandson and his foot hit his grandsons heel fractured his toe had immediate deformity pain had to have a reduction in the ER which was successful and the toe was taped and he was placed in a boot and is comfortable at the moment Review of Systems  Respiratory: Positive for shortness of breath.   Skin: Negative.   Neurological: Negative for tingling.     Past Medical History:  Diagnosis Date  . Anxiety   . Asthma   . Chest pain    a. 2003 Cath: nl cors.  [name] COPD (chronic obstructive pulmonary disease) ([place])   . Depression   . Diabetes mellitus   . Dyslipidemia   . Dysrhythmia   . Fracture 12/15   lower back "L-1"  . GERD (gastroesophageal reflux disease)   . Hypertension   . PONV (postoperative nausea and vomiting)   . Splenomegaly     Past Surgical History:  Procedure Laterality Date  . BIOPSY  07/26/2016   Procedure: BIOPSY;  Surgeon: [name], MD;  Location: [facility];  Service: Endoscopy;;  gastric  . CARDIAC CATHETERIZATION  10/22/2001   normal coronary arteriea (Dr. Domenic Moras)  . COLONOSCOPY  08/31/2011   TKP:[id number] rectal and colon polyps-treated. Single tubular adenoma. Next TCS 08/2016  . COLONOSCOPY N/A 07/26/2016   Dr. [name]: diverticulosis in sigmoid and descending colon. 6 mm benign splenic flexure. surveillance 5 yeras  . ESOPHAGOGASTRODUODENOSCOPY N/A 07/26/2016   Dr. [name]: normal esophagus, portal gastropathy, chronic gastritis, normal duodenum, screening EGD 2 years   . ESOPHAGOGASTRODUODENOSCOPY (EGD) WITH ESOPHAGEAL DILATION N/A 05/15/2013   XNT:ZGYFVC esophagus-status post Venia Minks dilation/Portal gastropathy. Antral erosions-status post biopsy. extrinsic compression along lesser curvature likely secondary to splenomegaly. gastric bx benign   . HERNIA REPAIR  03/2009  . POLYPECTOMY  07/26/2016   Procedure: POLYPECTOMY;  Surgeon: [name], MD;  Location: [facility];  Service: Endoscopy;;  colon  . TRANSTHORACIC ECHOCARDIOGRAM  2011   EF 94-49%, stage 1 diastolic dysfunction, trace TR & pulm valve regurg   . VASECTOMY  1989    Family History  Problem Relation Age of Onset  . Heart disease Mother   . Cancer Mother        lymph nodes  . Diabetes Mother   . Arthritis Other   . Lung disease Other   . Asthma Other   . Kidney disease Other   . Ovarian cancer Sister   . Diabetes Brother   . Heart disease Brother   . Hypertension Brother   . Diabetes Sister   . Diabetes Sister   . Cirrhosis Sister 59       no etoh  . Colon cancer Neg Hx    Social History   Tobacco Use  . Smoking status: Former Smoker    Packs/day: 1.25    Years: 36.00    Pack years: 45.00    Types: Cigarettes    Quit date: 04/2011    Years since quitting: 8.7  . Smokeless tobacco: Former Network engineer  . Vaping Use: Never used  Substance Use Topics  . Alcohol use: No  . Drug use: No    Allergies  Allergen Reactions  . Baclofen  lethargic   . Simvastatin Nausea Only    Current Meds  Medication Sig  . acetaminophen (TYLENOL) 500 MG tablet Take 1,000 mg by mouth daily as needed for headache.  . albuterol (PROVENTIL) (2.5 MG/3ML) 0.083% nebulizer solution Take 3 mLs (2.5 mg total) by nebulization every 2 (two) hours as needed for wheezing.  [name] ALPRAZolam (XANAX) 0.25 MG tablet Take 0.25 mg by mouth 2 (two) times daily.   [name] aspirin EC 81 MG tablet Take 81 mg by mouth at bedtime.   [name] atorvastatin (LIPITOR) 40 MG tablet Take 1 tablet (40 mg total) by mouth daily. Please make annual appt with Dr. Debara Pickett for refills. [phone]. 3rd& final attempt.  . budesonide-formoterol (SYMBICORT) 160-4.5 MCG/ACT inhaler Inhale 2 puffs into the lungs 2 (two) times daily.  [name] diltiazem (CARDIZEM CD) 240 MG 24 hr capsule TAKE 1 CAPSULE BY MOUTH  EVERY DAY  . gabapentin (NEURONTIN) 300 MG capsule Take 1 capsule by mouth at bedtime.  [name] HYDROcodone-acetaminophen (NORCO/VICODIN) 5-325 MG per tablet Take 1 tablet by mouth every 8 (eight) hours as needed for moderate pain. (Patient taking differently: Take 1 tablet by mouth daily as needed for moderate pain. )  . JARDIANCE 25 MG TABS tablet Take 25 mg by mouth every evening.   . Multiple Vitamin (MULITIVITAMIN WITH MINERALS) TABS Take 1 tablet by mouth at bedtime.   Glory Rosebush DELICA LANCETS 31D MISC   . ONETOUCH VERIO test strip   . OXYGEN Inhale into the lungs. Pt uses oxygen at night and when exerting himself  . pantoprazole (PROTONIX) 40 MG tablet Take 1 tablet (40 mg total) by mouth daily before breakfast.  . predniSONE (DELTASONE) 10 MG tablet Take  4 each am x 2 days,   2 each am x 2 days,  1 each am x 2 days and stop  . PROAIR HFA 108 (90 Base) MCG/ACT inhaler Inhale 2 puffs into the lungs every 4 (four) hours as needed for wheezing or shortness of breath.   . sertraline (ZOLOFT) 50 MG tablet Take 50 mg by mouth daily.  . SitaGLIPtin-MetFORMIN HCl (JANUMET XR) [phone] MG TB24 Take 1 tablet by mouth daily.  . SitaGLIPtin-MetFORMIN HCl [phone] MG TB24 Take by mouth.  . tamsulosin (FLOMAX) 0.4 MG CAPS capsule Take 0.4 mg by mouth daily.  . Tiotropium Bromide Monohydrate (SPIRIVA RESPIMAT) 2.5 MCG/ACT AERS Inhale 2 puffs into the lungs daily.  [name] VITAMIN D PO Take 1 tablet by mouth daily.    BP 129/78   Pulse 99   Ht 5' 7"  (1.702 m)   Wt 162 lb (73.5 kg)   BMI 25.37 kg/m   Physical Exam Constitutional:      General: He is not in acute distress.    Appearance: Normal appearance. He is normal weight.  HENT:     Head: Normocephalic.  Skin:    General: Skin is warm.     Capillary Refill: Capillary refill takes less than 2 seconds.  Neurological:     Mental Status: He is alert and oriented to person, place, and time.  Psychiatric:        Mood and Affect: Mood normal.         Behavior: Behavior normal.        Thought Content: Thought content normal.        Judgment: Judgment normal.     Ortho Exam   I took the tape off the toe looks reasonably straight no rotatory deformity that I can see tender  to touch mild swelling color capillary refill normal.  He can move the toe fine   MEDICAL DECISION MAKING  A.  Encounter Diagnosis  Name Primary?  . Closed displaced fracture of proximal phalanx of lesser toe of right foot, initial encounter Yes    B. DATA ANALYSED:  ER records  IMAGING: Independent interpretation of images: First x-ray shows a comminuted fracture proximal phalanx with deviation towards the fibula and the second x-rays show improved alignment in reasonable alignment  Orders: None  Outside records reviewed: As stated  C. MANAGEMENT retape, convert to postop shoe which she has at home weight-bear as tolerated x-ray in a month  No orders of the defined types were placed in this encounter.     Arther Abbott, MD  12/30/2019 3:31 PM

## 2020-01-05 DIAGNOSIS — I10 Essential (primary) hypertension: Secondary | ICD-10-CM | POA: Diagnosis not present

## 2020-01-05 DIAGNOSIS — F411 Generalized anxiety disorder: Secondary | ICD-10-CM | POA: Diagnosis not present

## 2020-01-20 ENCOUNTER — Ambulatory Visit: Payer: PPO

## 2020-01-20 ENCOUNTER — Other Ambulatory Visit: Payer: Self-pay

## 2020-01-20 ENCOUNTER — Ambulatory Visit (INDEPENDENT_AMBULATORY_CARE_PROVIDER_SITE_OTHER): Payer: PPO | Admitting: Orthopedic Surgery

## 2020-01-20 DIAGNOSIS — S92511D Displaced fracture of proximal phalanx of right lesser toe(s), subsequent encounter for fracture with routine healing: Secondary | ICD-10-CM

## 2020-01-20 NOTE — Addendum Note (Signed)
Addended by: Arther Abbott E on: 01/20/2020 11:54 AM   Modules accepted: Level of Service

## 2020-01-20 NOTE — Patient Instructions (Signed)
Good to go

## 2020-01-20 NOTE — Progress Notes (Signed)
Chief Complaint  Patient presents with  . Fracture    Rt foot DOI 12/21/19   Proximal phalanx fracture right foot #5  Patient doing well took his shoe off about a week ago kept the toes buddy taped  Toe alignment looks normal he is moving them well  Color looks good  X-ray shows fracture healing with normal alignment  Patient released to normal activity

## 2020-02-03 ENCOUNTER — Ambulatory Visit: Payer: PPO | Admitting: Internal Medicine

## 2020-02-03 ENCOUNTER — Ambulatory Visit (HOSPITAL_COMMUNITY)
Admission: RE | Admit: 2020-02-03 | Discharge: 2020-02-03 | Disposition: A | Discharge status: Home / Self Care | Payer: PPO | Source: Ambulatory Visit | Attending: Nurse Practitioner | Admitting: Nurse Practitioner

## 2020-02-03 ENCOUNTER — Encounter: Payer: Self-pay | Admitting: Internal Medicine

## 2020-02-03 ENCOUNTER — Inpatient Hospital Stay (HOSPITAL_COMMUNITY): Payer: PPO | Attending: Hematology

## 2020-02-03 ENCOUNTER — Other Ambulatory Visit: Payer: Self-pay

## 2020-02-03 DIAGNOSIS — Z87891 Personal history of nicotine dependence: Secondary | ICD-10-CM | POA: Insufficient documentation

## 2020-02-03 DIAGNOSIS — J9611 Chronic respiratory failure with hypoxia: Secondary | ICD-10-CM

## 2020-02-03 DIAGNOSIS — Z122 Encounter for screening for malignant neoplasm of respiratory organs: Secondary | ICD-10-CM | POA: Diagnosis not present

## 2020-02-03 DIAGNOSIS — D696 Thrombocytopenia, unspecified: Secondary | ICD-10-CM | POA: Insufficient documentation

## 2020-02-03 DIAGNOSIS — R918 Other nonspecific abnormal finding of lung field: Secondary | ICD-10-CM | POA: Diagnosis not present

## 2020-02-03 DIAGNOSIS — J449 Chronic obstructive pulmonary disease, unspecified: Secondary | ICD-10-CM | POA: Diagnosis not present

## 2020-02-03 DIAGNOSIS — J441 Chronic obstructive pulmonary disease with (acute) exacerbation: Secondary | ICD-10-CM

## 2020-02-03 LAB — COMPREHENSIVE METABOLIC PANEL
ALT: 37 U/L (ref 0–44)
AST: 23 U/L (ref 15–41)
Albumin: 4.1 g/dL (ref 3.5–5.0)
Alkaline Phosphatase: 102 U/L (ref 38–126)
Anion gap: 10 (ref 5–15)
BUN: 14 mg/dL (ref 8–23)
CO2: 27 mmol/L (ref 22–32)
Calcium: 9.2 mg/dL (ref 8.9–10.3)
Chloride: 101 mmol/L (ref 98–111)
Creatinine, Ser: 0.88 mg/dL (ref 0.61–1.24)
GFR calc Af Amer: >60 mL/min (ref >60)
GFR calc non Af Amer: >60 mL/min (ref >60)
Glucose, Bld: 164 mg/dL — ABNORMAL HIGH (ref 70–99)
Potassium: 4.5 mmol/L (ref 3.5–5.1)
Sodium: 138 mmol/L (ref 135–145)
Total Bilirubin: 0.9 mg/dL (ref 0.3–1.2)
Total Protein: 8.4 g/dL — ABNORMAL HIGH (ref 6.5–8.1)

## 2020-02-03 LAB — CBC WITH DIFFERENTIAL/PLATELET
Abs Immature Granulocytes: 0.03 10*3/uL (ref 0.00–0.07)
Basophils Absolute: 0.1 10*3/uL (ref 0.0–0.1)
Basophils Relative: 1 %
Eosinophils Absolute: 0.3 10*3/uL (ref 0.0–0.5)
Eosinophils Relative: 4 %
HCT: 48.5 % (ref 39.0–52.0)
Hemoglobin: 15.1 g/dL (ref 13.0–17.0)
Immature Granulocytes: 1 %
Lymphocytes Relative: 18 %
Lymphs Abs: 1.1 10*3/uL (ref 0.7–4.0)
MCH: 26.5 pg (ref 26.0–34.0)
MCHC: 31.1 g/dL (ref 30.0–36.0)
MCV: 85.2 fL (ref 80.0–100.0)
Monocytes Absolute: 0.4 10*3/uL (ref 0.1–1.0)
Monocytes Relative: 7 %
Neutro Abs: 4.3 10*3/uL (ref 1.7–7.7)
Neutrophils Relative %: 69 %
Platelets: 117 10*3/uL — ABNORMAL LOW (ref 150–400)
RBC: 5.69 MIL/uL (ref 4.22–5.81)
RDW: 14.8 % (ref 11.5–15.5)
WBC: 6.2 10*3/uL (ref 4.0–10.5)
nRBC: 0 % (ref 0.0–0.2)

## 2020-02-03 LAB — VITAMIN D 25 HYDROXY (VIT D DEFICIENCY, FRACTURES): Vit D, 25-Hydroxy: 28.98 ng/mL — ABNORMAL LOW (ref 30–100)

## 2020-02-03 LAB — VITAMIN B12: Vitamin B-12: 995 pg/mL — ABNORMAL HIGH (ref 180–914)

## 2020-02-03 LAB — LACTATE DEHYDROGENASE: LDH: 125 U/L (ref 98–192)

## 2020-02-03 MED ORDER — PREDNISONE 10 MG PO TABS
ORAL_TABLET | ORAL | 0 refills | Status: DC
Start: 2020-02-03 — End: 2020-02-09

## 2020-02-03 MED ORDER — SPIRIVA RESPIMAT 2.5 MCG/ACT IN AERS: 2.0000 | INHALATION_SPRAY | Freq: Every day | RESPIRATORY_TRACT | 3 refills | Status: DC

## 2020-02-03 MED ORDER — AZITHROMYCIN 250 MG PO TABS: ORAL_TABLET | ORAL | 0 refills | Status: DC

## 2020-02-03 NOTE — Patient Instructions (Addendum)
zpak  Prednisone 10 mg take  4 each am x 2 days,   2 each am x 2 days,  1 each am x 2 days and stop   Change spiriva to respimat 2 puff each am   For cough mucinex dm up to 1200 mg every 12 hours and supplement hydocodone every 4 hours as needed   Get your covid vaccination asap  We will call [employer] for a best fit analysis for portable 0xygen   In meantime, Make sure you check your oxygen saturations at highest level of activity to be sure it stays over 90% and adjust upward to maintain this level if needed but remember to turn it back to previous settings when you stop (to conserve your supply).   Please schedule a follow up visit in 3 months but call sooner if needed   .

## 2020-02-03 NOTE — Progress Notes (Signed)
[name], male    DOB: 11/11/1955     MRN: [id number]   Brief patient profile:  51 yowm quit smoking 2012 with COPD GOLD III criteria 06/17/20 previously followed by Dr [name] referred back to pulmonary clinic 12/09/2019 in [place].   History of Present Illness  12/09/2019  Pulmonary/ 1st office eval/[name]  symbicort 160/spiriva  Chief Complaint  Patient presents with  . Pulmonary Consult    Former patient of Dr [name].  Breathing has been worse over the past few wks. Recently treated with Doxy per PCP for COPD exacerbation. He is occ coughing up some green sputum.   flare 11/23/19  rx doxy 5/19 x 7 days  Dyspnea:  Baseline uses HC parking / MMRC3 = can't walk 100 yards even at a slow pace at a flat grade s stopping due to sob  On 2lpm  Cough: improved less purulent purulent  Sleep: wakes up coughing since onset /sometimes uses saba proair  SABA use: rarely daytime rec Continue protonix 40 mg Take 30-60 min before first meal of the day  GERD diet  Plan A = Automatic = Always=    Budesonide/formoterol (symbicort ) 2 puffs first thing in am and 12 hours later and use spiriva x 2 pffs after the morning symbicort (ok to change back to the powder after 2 weeks but will need to stop it if you start coughing  Work on inhaler technique  Plan B = Backup (to supplement plan A, not to replace it) Only use your albuterol inhaler as a rescue medication    Plan C = Crisis (instead of Plan B   Make sure you check your oxygen saturations at highest level of activity to be sure it stays over 90%   Prednisone 10 mg take  4 each am x 2 days,   2 each am x 2 days,  1 each am x 2 days and stop  For cough > mucinex dm 1200 mg every 12 hours as needed  Would wait another 2 weeks before you get your first shot for covid 19    02/03/2020  f/u ov/[name] re: GOLD III/ 02 dep/ rx symb /spriva powder  Chief Complaint  Patient presents with  . Acute Visit    shortness of breath with exertion, productive  cough with green phlegm  Dyspnea: abruptly worse x 48 h = MMRC3 = can't walk 100 yards even at a slow pace at a flat grade s stopping due to sob   Cough: worse with green mucus/ worse since changed back to dpi spiriva Sleeping: 2 am tends to cramp SABA use: none on day of ov  02: 2lpm  Up to 2.5 lpm    No obvious day to day or daytime variability or assoc  mucus plugs or hemoptysis or cp or chest tightness, subjective wheeze or overt sinus or hb symptoms.    . Also denies any obvious fluctuation of symptoms with weather or environmental changes or other aggravating or alleviating factors except as outlined above   No unusual exposure hx or h/o childhood pna/ asthma or knowledge of premature birth.  Current Allergies, Complete Past Medical History, Past Surgical History, Family History, and Social History were reviewed in [employer] record.  ROS  The following are not active complaints unless bolded Hoarseness, sore throat, dysphagia, dental problems, itching, sneezing,  nasal congestion or discharge of excess mucus or purulent secretions, ear ache,   fever, chills, sweats, unintended wt loss or  wt gain, classically pleuritic or exertional cp,  orthopnea pnd or arm/hand swelling  or leg swelling, presyncope, palpitations, abdominal pain, anorexia, nausea, vomiting, diarrhea  or change in bowel habits or change in bladder habits, change in stools or change in urine, dysuria, hematuria,  rash, arthralgias, visual complaints, headache, numbness, weakness or ataxia or problems with walking or coordination,  change in mood or  memory.        Current Meds  Medication Sig  . acetaminophen (TYLENOL) 500 MG tablet Take 1,000 mg by mouth daily as needed for headache.  . albuterol (PROVENTIL) (2.5 MG/3ML) 0.083% nebulizer solution Take 3 mLs (2.5 mg total) by nebulization every 2 (two) hours as needed for wheezing.  [name] ALPRAZolam (XANAX) 0.25 MG tablet Take 0.25 mg by mouth 2 (two)  times daily.   [name] aspirin EC 81 MG tablet Take 81 mg by mouth at bedtime.   [name] atorvastatin (LIPITOR) 40 MG tablet Take 1 tablet (40 mg total) by mouth daily. Please make annual appt with Dr. Debara [name] for refills. [phone]. 3rd& final attempt.  . budesonide-formoterol (SYMBICORT) 160-4.5 MCG/ACT inhaler Inhale 2 puffs into the lungs 2 (two) times daily.  [name] diltiazem (CARDIZEM CD) 240 MG 24 hr capsule TAKE 1 CAPSULE BY MOUTH EVERY DAY  . gabapentin (NEURONTIN) 300 MG capsule Take 1 capsule by mouth at bedtime.  [name] HYDROcodone-acetaminophen (NORCO/VICODIN) 5-325 MG per tablet Take 1 tablet by mouth every 8 (eight) hours as needed for moderate pain. (Patient taking differently: Take 1 tablet by mouth daily as needed for moderate pain. )  . JARDIANCE 25 MG TABS tablet Take 25 mg by mouth every evening.   . Multiple Vitamin (MULITIVITAMIN WITH MINERALS) TABS Take 1 tablet by mouth at bedtime.   Glory Rosebush DELICA LANCETS 92E MISC   . ONETOUCH VERIO test strip   . OXYGEN Inhale into the lungs. Pt uses oxygen at night and when exerting himself  . pantoprazole (PROTONIX) 40 MG tablet Take 1 tablet (40 mg total) by mouth daily before breakfast.  . PROAIR HFA 108 (90 Base) MCG/ACT inhaler Inhale 2 puffs into the lungs every 4 (four) hours as needed for wheezing or shortness of breath.   . sertraline (ZOLOFT) 50 MG tablet Take 50 mg by mouth daily.  . SitaGLIPtin-MetFORMIN HCl (JANUMET XR) [phone] MG TB24 Take 1 tablet by mouth daily.  . SitaGLIPtin-MetFORMIN HCl [phone] MG TB24 Take by mouth.  . tamsulosin (FLOMAX) 0.4 MG CAPS capsule Take 0.4 mg by mouth daily.  . Tiotropium Bromide Monohydrate (SPIRIVA RESPIMAT) 2.5 MCG/ACT AERS Inhale 2 puffs into the lungs daily.  [name] VITAMIN D PO Take 1 tablet by mouth daily.                        Past Medical History:  Diagnosis Date  . Anxiety   . Asthma   . Chest pain    a. 2003 Cath: nl cors.  [name] COPD (chronic obstructive pulmonary disease) ([place])    . Depression   . Diabetes mellitus   . Dyslipidemia   . Dysrhythmia   . Fracture 12/15   lower back "L-1"  . GERD (gastroesophageal reflux disease)   . Hypertension   . PONV (postoperative nausea and vomiting)   . Splenomegaly        Objective:    Wt Readings from Last 3 Encounters:  12/30/19 162 lb (73.5 kg)  12/21/19 162 lb (73.5 kg)  12/09/19 162 lb (73.5 kg)  Vital signs reviewed - Note on arrival 02/03/2020  02 sats  93% on 2lpm  NP    amb chronically ill wm nad at rest  HEENT : pt wearing mask not removed for exam due to covid -19 concerns.    NECK :  without JVD/Nodes/TM/ nl carotid upstrokes bilaterally   LUNGS: no acc muscle use,  Mod barrel  contour chest wall with bilateral  Distant bs s audible wheeze and  without cough on insp or exp maneuvers and mod  Hyperresonant  to  percussion bilaterally     CV:  RRR  no s3 or murmur or increase in P2, and no edema   ABD:  soft and nontender with pos mid insp Hoover's  in the supine position. No bruits or organomegaly appreciated, bowel sounds nl  MS:     ext warm without deformities, calf tenderness, cyanosis or clubbing No obvious joint restrictions   SKIN: warm and dry without lesions    NEURO:  alert, approp, nl sensorium with  no motor or cerebellar deficits apparent.           I personally reviewed images and agree with radiology impression as follows:   Chest CT LCS 02/03/2020  1. Lung-RADS 2, benign appearance or behavior. Continue annual screening with low-dose chest CT without contrast in 12 months. 2. New/increasing peribronchovascular ground-glass, nodular consolidation and mucoid impaction in the right middle lobe, lingula and both lower lobes, which may be due to mycobacterium avium complex.            Assessment

## 2020-02-04 ENCOUNTER — Encounter: Payer: Self-pay | Admitting: Internal Medicine

## 2020-02-04 DIAGNOSIS — J9611 Chronic respiratory failure with hypoxia: Secondary | ICD-10-CM | POA: Insufficient documentation

## 2020-02-04 NOTE — Progress Notes (Signed)
Please call patient and let them  know their  low dose Ct was read as a Lung RADS 2: nodules that are benign in appearance and behavior with a very low likelihood of becoming a clinically active cancer due to size or lack of growth. Recommendation per radiology is for a repeat LDCT in 12 months. .Please let them  know we will order and schedule their  annual screening scan for 01/2021. Please let them  know there was notation of CAD on their  scan.  Please remind the patient  that this is a non-gated exam therefore degree or severity of disease  cannot be determined. Please have them  follow up with their PCP regarding potential risk factor modification, dietary therapy or pharmacologic therapy if clinically indicated. Pt.  is  currently on statin therapy. Please place order for annual  screening scan for  01/2021 and fax results to PCP. Thanks so much.

## 2020-02-04 NOTE — Assessment & Plan Note (Addendum)
As of 02/03/2020  2lpm 24/7  -  02/03/2020 referred to [facility] for Best fit for amb 02   Advised: Make sure you check your oxygen saturations at highest level of activity to be sure it stays over 90% and adjust upward to maintain this level if needed but remember to turn it back to previous settings when you stop (to conserve your supply).    Pt informed of the seriousness of COVID 19 infection as a direct risk to lung health  and safey and to close contacts and should continue to wear a facemask in public and minimize exposure to public locations but especially avoid any area or activity where non-close contacts are not observing distancing or wearing an appropriate face mask.  I strongly recommended she take either of the vaccines available through local drugstores based on updated information on millions of Americans treated with the [place] products  which have proven both safe and  effective even against the new delta variant.            Each maintenance medication was reviewed in detail including emphasizing most importantly the difference between maintenance and prns and under what circumstances the prns are to be triggered using an action plan format where appropriate.  Total time for H and P, chart review, counseling, teaching device and generating customized AVS unique to this acute office visit / charting = 30 min

## 2020-02-04 NOTE — Progress Notes (Signed)
Lung scan was fine from cancer perspective.  New/increasing peribronchovascular ground-glass, nodular consolidation and mucoid impaction in the right middle lobe, lingula and both lower lobes, which may be due to mycobacterium avium Complex. Scheduled to see you 04/2020, but didn't know if you might want to see them sooner.

## 2020-02-04 NOTE — Assessment & Plan Note (Addendum)
Quit smoking 2012 - PFT's  06/18/2019  FEV1 1.12 (35 % ) ratio 0.46  p 0 % improvement from saba p ? prior to study with DLCO  11.91 (48%) corrects to 3.18 (754%)  for alv volume and FV curve classic concavity   - 12/09/2019  After extensive coaching inhaler device,  effectiveness =    90% with smi so try spiriva respimat 2 bid instead of DPI    Group D in terms of symptom/risk and laba/lama/ICS  therefore appropriate rx at this point >>>  Continue advair/ spiriva   Acute flare in setting of ? MAI > rx zpak, Prednisone 10 mg take  4 each am x 2 days,   2 each am x 2 days,  1 each am x 2 days and stop   For cough > mucinex dm 1200 bid and supplement with norco prn

## 2020-02-05 ENCOUNTER — Ambulatory Visit (HOSPITAL_COMMUNITY): Payer: PPO | Admitting: Nurse Practitioner

## 2020-02-08 ENCOUNTER — Other Ambulatory Visit: Payer: Self-pay | Admitting: *Deleted

## 2020-02-08 DIAGNOSIS — Z87891 Personal history of nicotine dependence: Secondary | ICD-10-CM

## 2020-02-09 ENCOUNTER — Other Ambulatory Visit: Payer: Self-pay

## 2020-02-09 ENCOUNTER — Inpatient Hospital Stay (HOSPITAL_COMMUNITY): Payer: PPO | Attending: Nurse Practitioner | Admitting: Nurse Practitioner

## 2020-02-09 DIAGNOSIS — Z7951 Long term (current) use of inhaled steroids: Secondary | ICD-10-CM | POA: Diagnosis not present

## 2020-02-09 DIAGNOSIS — Z7982 Long term (current) use of aspirin: Secondary | ICD-10-CM | POA: Insufficient documentation

## 2020-02-09 DIAGNOSIS — E785 Hyperlipidemia, unspecified: Secondary | ICD-10-CM | POA: Insufficient documentation

## 2020-02-09 DIAGNOSIS — E119 Type 2 diabetes mellitus without complications: Secondary | ICD-10-CM | POA: Diagnosis not present

## 2020-02-09 DIAGNOSIS — K219 Gastro-esophageal reflux disease without esophagitis: Secondary | ICD-10-CM | POA: Insufficient documentation

## 2020-02-09 DIAGNOSIS — D696 Thrombocytopenia, unspecified: Secondary | ICD-10-CM | POA: Insufficient documentation

## 2020-02-09 DIAGNOSIS — Z833 Family history of diabetes mellitus: Secondary | ICD-10-CM | POA: Insufficient documentation

## 2020-02-09 DIAGNOSIS — Z808 Family history of malignant neoplasm of other organs or systems: Secondary | ICD-10-CM | POA: Diagnosis not present

## 2020-02-09 DIAGNOSIS — Z87891 Personal history of nicotine dependence: Secondary | ICD-10-CM | POA: Diagnosis not present

## 2020-02-09 DIAGNOSIS — Z79899 Other long term (current) drug therapy: Secondary | ICD-10-CM | POA: Diagnosis not present

## 2020-02-09 DIAGNOSIS — Z8261 Family history of arthritis: Secondary | ICD-10-CM | POA: Diagnosis not present

## 2020-02-09 DIAGNOSIS — Z7952 Long term (current) use of systemic steroids: Secondary | ICD-10-CM | POA: Insufficient documentation

## 2020-02-09 DIAGNOSIS — I10 Essential (primary) hypertension: Secondary | ICD-10-CM | POA: Insufficient documentation

## 2020-02-09 DIAGNOSIS — F329 Major depressive disorder, single episode, unspecified: Secondary | ICD-10-CM | POA: Insufficient documentation

## 2020-02-09 DIAGNOSIS — Z8041 Family history of malignant neoplasm of ovary: Secondary | ICD-10-CM | POA: Insufficient documentation

## 2020-02-09 DIAGNOSIS — Z8249 Family history of ischemic heart disease and other diseases of the circulatory system: Secondary | ICD-10-CM | POA: Insufficient documentation

## 2020-02-09 DIAGNOSIS — J449 Chronic obstructive pulmonary disease, unspecified: Secondary | ICD-10-CM | POA: Insufficient documentation

## 2020-02-09 DIAGNOSIS — F419 Anxiety disorder, unspecified: Secondary | ICD-10-CM | POA: Insufficient documentation

## 2020-02-09 DIAGNOSIS — Z7984 Long term (current) use of oral hypoglycemic drugs: Secondary | ICD-10-CM | POA: Diagnosis not present

## 2020-02-09 NOTE — Assessment & Plan Note (Signed)
1.  Mild to moderate thrombocytopenia: -He has thrombocytopenia since 2010.  White count and hemoglobin are normal. -PET scan on 08/26/2017 for follow-up lung nodule showed fatty liver with normal spleen. -Liver biopsy on 02/21/2017 showed moderate to severe steatosis. -Etiology of thrombocytopenia includes liver disease versus ITP. -He takes an aspirin 81 mg daily.  He reports easy bruising on the upper extremities which is stable. -Labs done on 02/03/2020 showed platelets 117. -He will follow-up in 6 months with repeat labs.  2.  Lung nodules: -Patient smoked 30 years, at least 1-1/2 packs/day.  He quit 7 to 8 years ago. -CT low-dose on 02/03/2020 was lung RADS 2.  Repeat CT in 12 months. -He reports mild pink sputum when he coughs hard.  He does report occasional nosebleeds.  He wears oxygen 24/7.

## 2020-02-09 NOTE — Progress Notes (Signed)
[place], [place] [address]   CLINIC:  Medical Oncology/Hematology  PCP:  Jani Gravel, MD [place] [id number] [phone]   REASON FOR VISIT: Follow-up for thrombocytopenia   CURRENT THERAPY: Observation   INTERVAL HISTORY:  Mr. Cen 64 y.o. male returns for routine follow-up for thrombocytopenia.  Patient reports he is doing well since his last visit.  Patient denies any petechiae.  He denies any issues with bleeding. Denies any nausea, vomiting, or diarrhea. Denies any new pains. Had not noticed any recent bleeding such as epistaxis, hematuria or hematochezia. Denies recent chest pain on exertion, shortness of breath on minimal exertion, pre-syncopal episodes, or palpitations. Denies any numbness or tingling in hands or feet. Denies any recent fevers, infections, or recent hospitalizations. Patient reports appetite at 100% and energy level at 50%.  He is eating well maintain his weight this time.       REVIEW OF SYSTEMS:  Review of Systems  Respiratory: Positive for cough and shortness of breath.   Neurological: Positive for numbness.  Psychiatric/Behavioral: Positive for sleep disturbance.  All other systems reviewed and are negative.    PAST MEDICAL/SURGICAL HISTORY:  Past Medical History:  Diagnosis Date  . Anxiety   . Asthma   . Chest pain    a. 2003 Cath: nl cors.  [name] COPD (chronic obstructive pulmonary disease) ([place])   . Depression   . Diabetes mellitus   . Dyslipidemia   . Dysrhythmia   . Fracture 12/15   lower back "L-1"  . GERD (gastroesophageal reflux disease)   . Hypertension   . PONV (postoperative nausea and vomiting)   . Splenomegaly    Past Surgical History:  Procedure Laterality Date  . BIOPSY  07/26/2016   Procedure: BIOPSY;  Surgeon: [name], MD;  Location: [facility];  Service: Endoscopy;;  gastric  . CARDIAC CATHETERIZATION  10/22/2001   normal coronary  arteriea (Dr. Domenic Moras)  . COLONOSCOPY  08/31/2011   [id number] rectal and colon polyps-treated. Single tubular adenoma. Next TCS 08/2016  . COLONOSCOPY N/A 07/26/2016   Dr. [name]: diverticulosis in sigmoid and descending colon. 6 mm benign splenic flexure. surveillance 5 yeras  . ESOPHAGOGASTRODUODENOSCOPY N/A 07/26/2016   Dr. [name]: normal esophagus, portal gastropathy, chronic gastritis, normal duodenum, screening EGD 2 years   . ESOPHAGOGASTRODUODENOSCOPY (EGD) WITH ESOPHAGEAL DILATION N/A 05/15/2013   [id number] esophagus-status post Venia Minks dilation/Portal gastropathy. Antral erosions-status post biopsy. extrinsic compression along lesser curvature likely secondary to splenomegaly. gastric bx benign  . HERNIA REPAIR  03/2009  . POLYPECTOMY  07/26/2016   Procedure: POLYPECTOMY;  Surgeon: [name], MD;  Location: [facility];  Service: Endoscopy;;  colon  . TRANSTHORACIC ECHOCARDIOGRAM  2011   EF 38-75%, stage 1 diastolic dysfunction, trace TR & pulm valve regurg   . VASECTOMY  1989     SOCIAL HISTORY:  Social History   Socioeconomic History  . Marital status: Married    Spouse name: Not on file  . Number of children: Not on file  . Years of education: Not on file  . Highest education level: Not on file  Occupational History  . Not on file  Tobacco Use  . Smoking status: Former Smoker    Packs/day: 1.25    Years: 36.00    Pack years: 45.00    Types: Cigarettes    Quit date: 04/2011    Years since quitting: 8.8  . Smokeless  tobacco: Former Network engineer  . Vaping Use: Never used  Substance and Sexual Activity  . Alcohol use: No  . Drug use: No  . Sexual activity: Not on file  Other Topics Concern  . Not on file  Social History Narrative  . Not on file   Social Determinants of Health   Financial Resource Strain:   . Difficulty of Paying Living Expenses:   Food Insecurity:   . Worried About Charity fundraiser in the Last Year:   . Arboriculturist in the  Last Year:   Transportation Needs:   . Film/video editor (Medical):   [name] Lack of Transportation (Non-Medical):   Physical Activity:   . Days of Exercise per Week:   . Minutes of Exercise per Session:   Stress:   . Feeling of Stress :   Social Connections:   . Frequency of Communication with Friends and Family:   . Frequency of Social Gatherings with Friends and Family:   . Attends Religious Services:   . Active Member of Clubs or Organizations:   . Attends Archivist Meetings:   [name] Marital Status:   Intimate Partner Violence:   . Fear of Current or Ex-Partner:   . Emotionally Abused:   [name] Physically Abused:   . Sexually Abused:     FAMILY HISTORY:  Family History  Problem Relation Age of Onset  . Heart disease Mother   . Cancer Mother        lymph nodes  . Diabetes Mother   . Arthritis Other   . Lung disease Other   . Asthma Other   . Kidney disease Other   . Ovarian cancer Sister   . Diabetes Brother   . Heart disease Brother   . Hypertension Brother   . Diabetes Sister   . Diabetes Sister   . Cirrhosis Sister 13       no etoh  . Colon cancer Neg Hx     CURRENT MEDICATIONS:  Outpatient Encounter Medications as of 02/09/2020  Medication Sig  . ALPRAZolam (XANAX) 0.25 MG tablet Take 0.25 mg by mouth 2 (two) times daily.   [name] aspirin EC 81 MG tablet Take 81 mg by mouth at bedtime.   [name] atorvastatin (LIPITOR) 40 MG tablet Take 1 tablet (40 mg total) by mouth daily. Please make annual appt with Dr. Debara Pickett for refills. [phone]. 3rd& final attempt.  . budesonide-formoterol (SYMBICORT) 160-4.5 MCG/ACT inhaler Inhale 2 puffs into the lungs 2 (two) times daily.  [name] diltiazem (CARDIZEM CD) 240 MG 24 hr capsule TAKE 1 CAPSULE BY MOUTH EVERY DAY  . gabapentin (NEURONTIN) 300 MG capsule Take 1 capsule by mouth at bedtime.  [name] JARDIANCE 25 MG TABS tablet Take 25 mg by mouth every evening.   . Multiple Vitamin (MULITIVITAMIN WITH MINERALS) TABS Take 1 tablet by mouth  at bedtime.   Glory Rosebush DELICA LANCETS 82N MISC   . ONETOUCH VERIO test strip   . OXYGEN Inhale into the lungs. Pt uses oxygen at night and when exerting himself  . pantoprazole (PROTONIX) 40 MG tablet Take 1 tablet (40 mg total) by mouth daily before breakfast.  . sertraline (ZOLOFT) 50 MG tablet Take 50 mg by mouth daily.  . SitaGLIPtin-MetFORMIN HCl (JANUMET XR) [phone] MG TB24 Take 1 tablet by mouth daily.  . SitaGLIPtin-MetFORMIN HCl [phone] MG TB24 Take by mouth.  . tamsulosin (FLOMAX) 0.4 MG CAPS capsule Take 0.4 mg by mouth daily.  [name]  Tiotropium Bromide Monohydrate (SPIRIVA RESPIMAT) 2.5 MCG/ACT AERS Inhale 2 puffs into the lungs daily.  [name] VITAMIN D PO Take 1 tablet by mouth daily.  . [DISCONTINUED] azithromycin (ZITHROMAX) 250 MG tablet Take 2 on day one then 1 daily x 4 days  . [DISCONTINUED] predniSONE (DELTASONE) 10 MG tablet Take  4 each am x 2 days,   2 each am x 2 days,  1 each am x 2 days and stop  . acetaminophen (TYLENOL) 500 MG tablet Take 1,000 mg by mouth daily as needed for headache. (Patient not taking: Reported on 02/09/2020)  . albuterol (PROVENTIL) (2.5 MG/3ML) 0.083% nebulizer solution Take 3 mLs (2.5 mg total) by nebulization every 2 (two) hours as needed for wheezing. (Patient not taking: Reported on 02/09/2020)  . HYDROcodone-acetaminophen (NORCO/VICODIN) 5-325 MG per tablet Take 1 tablet by mouth every 8 (eight) hours as needed for moderate pain. (Patient not taking: Reported on 02/09/2020)  . PROAIR HFA 108 (90 Base) MCG/ACT inhaler Inhale 2 puffs into the lungs every 4 (four) hours as needed for wheezing or shortness of breath.  (Patient not taking: Reported on 02/09/2020)   No facility-administered encounter medications on file as of 02/09/2020.    ALLERGIES:  Allergies  Allergen Reactions  . Baclofen     lethargic   . Simvastatin Nausea Only     PHYSICAL EXAM:  ECOG Performance status: 1  Vitals:   02/09/20 1021  BP: 100/62  Pulse: 89  Resp: 20  Temp:  (!) 97.2 F (36.2 C)  SpO2: 96%   Filed Weights   02/09/20 1021  Weight: 164 lb 12.8 oz (74.8 kg)   Physical Exam Constitutional:      Appearance: Normal appearance. He is normal weight.  Cardiovascular:     Rate and Rhythm: Normal rate and regular rhythm.     Heart sounds: Normal heart sounds.  Pulmonary:     Effort: Pulmonary effort is normal.     Breath sounds: Normal breath sounds.  Abdominal:     General: Bowel sounds are normal.     Palpations: Abdomen is soft.  Musculoskeletal:        General: Normal range of motion.  Skin:    General: Skin is warm.  Neurological:     Mental Status: He is alert and oriented to person, place, and time. Mental status is at baseline.  Psychiatric:        Mood and Affect: Mood normal.        Behavior: Behavior normal.        Thought Content: Thought content normal.        Judgment: Judgment normal.      LABORATORY DATA:  I have reviewed the labs as listed.  CBC    Component Value Date/Time   WBC 6.2 02/03/2020 0830   RBC 5.69 02/03/2020 0830   HGB 15.1 02/03/2020 0830   HCT 48.5 02/03/2020 0830   PLT 117 (L) 02/03/2020 0830   MCV 85.2 02/03/2020 0830   MCH 26.5 02/03/2020 0830   MCHC 31.1 02/03/2020 0830   RDW 14.8 02/03/2020 0830   LYMPHSABS 1.1 02/03/2020 0830   MONOABS 0.4 02/03/2020 0830   EOSABS 0.3 02/03/2020 0830   BASOSABS 0.1 02/03/2020 0830   CMP Latest Ref Rng & Units 02/03/2020 08/11/2019 02/02/2019  Glucose 70 - 99 mg/dL 164(H) 154(H) 146(H)  BUN 8 - 23 mg/dL 14 15 19   Creatinine 0.61 - 1.24 mg/dL 0.88 0.92 1.05  Sodium 135 - 145 mmol/L 138 142  138  Potassium 3.5 - 5.1 mmol/L 4.5 4.7 4.4  Chloride 98 - 111 mmol/L 101 106 103  CO2 22 - 32 mmol/L 27 28 26   Calcium 8.9 - 10.3 mg/dL 9.2 9.2 9.4  Total Protein 6.5 - 8.1 g/dL 8.4(H) 8.9(H) 8.6(H)  Total Bilirubin 0.3 - 1.2 mg/dL 0.9 1.0 0.9  Alkaline Phos 38 - 126 U/L 102 119 93  AST 15 - 41 U/L 23 20 21   ALT 0 - 44 U/L 37 30 24    All questions were  answered to patient's stated satisfaction. Encouraged patient to call with any new concerns or questions before his next visit to the cancer center and we can certain see him sooner, if needed.     ASSESSMENT & PLAN:  Thrombocytopenia ([place]) 1.  Mild to moderate thrombocytopenia: -He has thrombocytopenia since 2010.  White count and hemoglobin are normal. -PET scan on 08/26/2017 for follow-up lung nodule showed fatty liver with normal spleen. -Liver biopsy on 02/21/2017 showed moderate to severe steatosis. -Etiology of thrombocytopenia includes liver disease versus ITP. -He takes an aspirin 81 mg daily.  He reports easy bruising on the upper extremities which is stable. -Labs done on 02/03/2020 showed platelets 117. -He will follow-up in 6 months with repeat labs.  2.  Lung nodules: -Patient smoked 30 years, at least 1-1/2 packs/day.  He quit 7 to 8 years ago. -CT low-dose on 02/03/2020 was lung RADS 2.  Repeat CT in 12 months. -He reports mild pink sputum when he coughs hard.  He does report occasional nosebleeds.  He wears oxygen 24/7.     Orders placed this encounter:  Orders Placed This Encounter  Procedures  . Lactate dehydrogenase  . CBC with Differential/Platelet  . Comprehensive metabolic panel  . Vitamin B12  . VITAMIN D 25 Hydroxy (Vit-D Deficiency, Fractures)      Francene Finders, FNP-C [place] [phone]

## 2020-03-17 ENCOUNTER — Other Ambulatory Visit: Payer: Self-pay

## 2020-03-17 DIAGNOSIS — J449 Chronic obstructive pulmonary disease, unspecified: Secondary | ICD-10-CM

## 2020-03-23 ENCOUNTER — Telehealth: Payer: Self-pay | Admitting: Internal Medicine

## 2020-03-23 NOTE — Telephone Encounter (Signed)
Sent a priority message to [employer] to see the status of this order done 03/17/20 [name]

## 2020-03-24 NOTE — Telephone Encounter (Signed)
Pt returning a phone call. Pt can be reached at [phone].

## 2020-03-24 NOTE — Telephone Encounter (Signed)
We have this patient set up with a Home Fill system. [employer] does not have a travel O2 program but we are affiliated with [email or web address]. You can let patients know to go online and contact [email or web address] about any travel needs that they have.  I have spoken to this pt and she is going to call me back with the fax# to [email or web address] our office need to send them a prescription for the the 02 pt needs for travel [name]

## 2020-03-25 NOTE — Telephone Encounter (Signed)
Pt called [employer] and was informed our office needs to fax them a prescription for your 02 with liter flows and concentrators etc for 02 use fax# is [phone]  Phone # [phone] Joellen Jersey

## 2020-03-25 NOTE — Telephone Encounter (Signed)
Checked recently placed orders and I am seeing an order that was placed on 03/17/20. Libby, please advise on this order that was placed.

## 2020-03-25 NOTE — Telephone Encounter (Signed)
Patient is returning phone call. Patient phone number is [phone].

## 2020-03-28 NOTE — Telephone Encounter (Signed)
Order has been printed and faxed to [employer] per pt's request Joellen Jersey

## 2020-04-17 ENCOUNTER — Emergency Department (HOSPITAL_COMMUNITY): Payer: PPO

## 2020-04-17 ENCOUNTER — Other Ambulatory Visit: Payer: Self-pay

## 2020-04-17 ENCOUNTER — Encounter (HOSPITAL_COMMUNITY): Payer: Self-pay | Admitting: Emergency Medicine

## 2020-04-17 ENCOUNTER — Emergency Department (HOSPITAL_COMMUNITY)
Admission: EM | Admit: 2020-04-17 | Discharge: 2020-04-17 | Disposition: A | Discharge status: Home / Self Care | Payer: PPO | Attending: Emergency Medicine | Admitting: Emergency Medicine

## 2020-04-17 DIAGNOSIS — X58XXXA Exposure to other specified factors, initial encounter: Secondary | ICD-10-CM | POA: Insufficient documentation

## 2020-04-17 DIAGNOSIS — J181 Lobar pneumonia, unspecified organism: Secondary | ICD-10-CM | POA: Diagnosis not present

## 2020-04-17 DIAGNOSIS — R109 Unspecified abdominal pain: Secondary | ICD-10-CM | POA: Diagnosis not present

## 2020-04-17 DIAGNOSIS — J449 Chronic obstructive pulmonary disease, unspecified: Secondary | ICD-10-CM | POA: Insufficient documentation

## 2020-04-17 DIAGNOSIS — Z7984 Long term (current) use of oral hypoglycemic drugs: Secondary | ICD-10-CM | POA: Diagnosis not present

## 2020-04-17 DIAGNOSIS — D7389 Other diseases of spleen: Secondary | ICD-10-CM | POA: Diagnosis not present

## 2020-04-17 DIAGNOSIS — J189 Pneumonia, unspecified organism: Secondary | ICD-10-CM

## 2020-04-17 DIAGNOSIS — M47814 Spondylosis without myelopathy or radiculopathy, thoracic region: Secondary | ICD-10-CM | POA: Diagnosis not present

## 2020-04-17 DIAGNOSIS — N281 Cyst of kidney, acquired: Secondary | ICD-10-CM | POA: Diagnosis not present

## 2020-04-17 DIAGNOSIS — Z79899 Other long term (current) drug therapy: Secondary | ICD-10-CM | POA: Diagnosis not present

## 2020-04-17 DIAGNOSIS — M549 Dorsalgia, unspecified: Secondary | ICD-10-CM | POA: Diagnosis present

## 2020-04-17 DIAGNOSIS — J8410 Pulmonary fibrosis, unspecified: Secondary | ICD-10-CM | POA: Diagnosis not present

## 2020-04-17 DIAGNOSIS — J45909 Unspecified asthma, uncomplicated: Secondary | ICD-10-CM | POA: Insufficient documentation

## 2020-04-17 DIAGNOSIS — E119 Type 2 diabetes mellitus without complications: Secondary | ICD-10-CM | POA: Diagnosis not present

## 2020-04-17 DIAGNOSIS — I10 Essential (primary) hypertension: Secondary | ICD-10-CM | POA: Insufficient documentation

## 2020-04-17 DIAGNOSIS — R079 Chest pain, unspecified: Secondary | ICD-10-CM | POA: Diagnosis not present

## 2020-04-17 DIAGNOSIS — S2231XA Fracture of one rib, right side, initial encounter for closed fracture: Secondary | ICD-10-CM | POA: Diagnosis not present

## 2020-04-17 DIAGNOSIS — Z87891 Personal history of nicotine dependence: Secondary | ICD-10-CM | POA: Diagnosis not present

## 2020-04-17 DIAGNOSIS — I739 Peripheral vascular disease, unspecified: Secondary | ICD-10-CM | POA: Diagnosis not present

## 2020-04-17 DIAGNOSIS — Z20822 Contact with and (suspected) exposure to covid-19: Secondary | ICD-10-CM | POA: Diagnosis not present

## 2020-04-17 DIAGNOSIS — K4090 Unilateral inguinal hernia, without obstruction or gangrene, not specified as recurrent: Secondary | ICD-10-CM | POA: Diagnosis not present

## 2020-04-17 DIAGNOSIS — K5730 Diverticulosis of large intestine without perforation or abscess without bleeding: Secondary | ICD-10-CM | POA: Diagnosis not present

## 2020-04-17 DIAGNOSIS — R059 Cough, unspecified: Secondary | ICD-10-CM | POA: Diagnosis not present

## 2020-04-17 LAB — HEPATIC FUNCTION PANEL
ALT: 29 U/L (ref 0–44)
AST: 21 U/L (ref 15–41)
Albumin: 4.1 g/dL (ref 3.5–5.0)
Alkaline Phosphatase: 96 U/L (ref 38–126)
Bilirubin, Direct: 0.1 mg/dL (ref 0.0–0.2)
Indirect Bilirubin: 0.7 mg/dL (ref 0.3–0.9)
Total Bilirubin: 0.8 mg/dL (ref 0.3–1.2)
Total Protein: 8.1 g/dL (ref 6.5–8.1)

## 2020-04-17 LAB — CBC
HCT: 46.7 % (ref 39.0–52.0)
Hemoglobin: 14.6 g/dL (ref 13.0–17.0)
MCH: 25.7 pg — ABNORMAL LOW (ref 26.0–34.0)
MCHC: 31.3 g/dL (ref 30.0–36.0)
MCV: 82.2 fL (ref 80.0–100.0)
Platelets: 90 10*3/uL — ABNORMAL LOW (ref 150–400)
RBC: 5.68 MIL/uL (ref 4.22–5.81)
RDW: 14.7 % (ref 11.5–15.5)
WBC: 8.4 10*3/uL (ref 4.0–10.5)
nRBC: 0 % (ref 0.0–0.2)

## 2020-04-17 LAB — RESPIRATORY PANEL BY RT PCR (FLU A&B, COVID)
Influenza A by PCR: NEGATIVE
Influenza B by PCR: NEGATIVE
SARS Coronavirus 2 by RT PCR: NEGATIVE

## 2020-04-17 LAB — BASIC METABOLIC PANEL
Anion gap: 9 (ref 5–15)
BUN: 12 mg/dL (ref 8–23)
CO2: 27 mmol/L (ref 22–32)
Calcium: 8.7 mg/dL — ABNORMAL LOW (ref 8.9–10.3)
Chloride: 102 mmol/L (ref 98–111)
Creatinine, Ser: 0.85 mg/dL (ref 0.61–1.24)
GFR, Estimated: >60 mL/min (ref >60)
Glucose, Bld: 147 mg/dL — ABNORMAL HIGH (ref 70–99)
Potassium: 4.2 mmol/L (ref 3.5–5.1)
Sodium: 138 mmol/L (ref 135–145)

## 2020-04-17 LAB — LIPASE, BLOOD: Lipase: 33 U/L (ref 11–51)

## 2020-04-17 MED ORDER — IOHEXOL 350 MG/ML SOLN
100.0000 mL | Freq: Once | INTRAVENOUS | Status: AC | PRN
  Administered 2020-04-17: 100 mL via INTRAVENOUS

## 2020-04-17 MED ORDER — SODIUM CHLORIDE 0.9 % IV SOLN
500.0000 mg | Freq: Once | INTRAVENOUS | Status: AC
  Administered 2020-04-17: 500 mg via INTRAVENOUS
  Filled 2020-04-17: qty 500

## 2020-04-17 MED ORDER — AZITHROMYCIN 250 MG PO TABS: 250.0000 mg | ORAL_TABLET | Freq: Every day | ORAL | 0 refills | Status: DC

## 2020-04-17 MED ORDER — SODIUM CHLORIDE 0.9 % IV SOLN
1.0000 g | Freq: Once | INTRAVENOUS | Status: AC
  Administered 2020-04-17: 1 g via INTRAVENOUS
  Filled 2020-04-17: qty 10

## 2020-04-17 MED ORDER — HYDROCODONE-ACETAMINOPHEN 5-325 MG PO TABS
1.0000 | ORAL_TABLET | Freq: Four times a day (QID) | ORAL | 0 refills | Status: DC | PRN
Start: 2020-04-17 — End: 2020-05-25

## 2020-04-17 NOTE — ED Triage Notes (Signed)
Pt c/o of right upper back pain and cough since yesterday.

## 2020-04-17 NOTE — ED Provider Notes (Signed)
[facility] EMERGENCY DEPARTMENT Provider Note   CSN: [id number] Arrival date & time: 04/17/20  3716     History Chief Complaint  Patient presents with  . Back Pain  . Cough    [name] is a 64 y.o. male.  Getting interruptions like patient presenting with a complaint of some increased shortness of breath and pain to the right back flank area over the ribs.  But no history of fall.  Has been coughing up some phlegm.  Patient does have COPD.  Patient is normally on oxygen at home.  Normally on 2 L.  Oxygen saturations on that are good.  No fevers.  Said the back pain for a week.  He has had both Covid vaccines.        Past Medical History:  Diagnosis Date  . Anxiety   . Asthma   . Chest pain    a. 2003 Cath: nl cors.  [name] COPD (chronic obstructive pulmonary disease) ([place])   . Depression   . Diabetes mellitus   . Dyslipidemia   . Dysrhythmia   . Fracture 12/15   lower back "L-1"  . GERD (gastroesophageal reflux disease)   . Hypertension   . PONV (postoperative nausea and vomiting)   . Splenomegaly     Patient Active Problem List   Diagnosis Date Noted  . Chronic respiratory failure with hypoxia ([place]) 02/04/2020  . Upper airway cough syndrome 12/10/2019  . Hives 07/30/2018  . Full body hives 07/29/2018  . Idiopathic angioedema 07/29/2018  . COPD GOLD III 07/29/2018  . Mycobacterium avium infection ([place]) 12/18/2017  . Goiter 10/25/2017  . Suspected Mycobacterial infectious disease 10/25/2017  . COPD with acute exacerbation ([place]) 10/19/2017  . Dehydration 10/19/2017  . NASH (nonalcoholic steatohepatitis) 08/05/2017  . Liver fibrosis 01/31/2017  . Hx of adenomatous colonic polyps 07/05/2016  . Hyperproteinemia 09/05/2014  . HTN (hypertension) 10/14/2013  . Dyslipidemia 10/14/2013  . Type 2 diabetes mellitus without complication, without long-term current use of insulin ([place]) 10/14/2013  . Leukopenia 08/08/2013  . Thrombocytopenia ([place]) 08/08/2013  .  Lower abdominal pain 07/06/2013  . Abnormal LFTs 07/06/2013  . GERD (gastroesophageal reflux disease) 07/06/2013  . Unspecified constipation 07/06/2013  . Abdominal bloating 04/21/2013  . Abdominal pain, generalized 04/21/2013  . Non-alcoholic fatty liver disease 04/21/2013  . Splenomegaly 04/21/2013  . Lumbago 11/26/2011  . Muscle weakness (generalized) 11/26/2011  . Bilateral leg pain 11/06/2011  . Back pain with radiation 11/06/2011  . DDD (degenerative disc disease), lumbar 11/06/2011    Past Surgical History:  Procedure Laterality Date  . BIOPSY  07/26/2016   Procedure: BIOPSY;  Surgeon: [name], MD;  Location: [facility] END[facility] SU[facility];  Service: Endoscopy;;  gastric  . CARDIAC CATHETERIZATION  10/22/2001   normal coronary arteriea (Dr. Domenic Moras)  . COLONOSCOPY  08/31/2011   RCV:ELFYBOFB rectal and colon polyps-treated. Single tubular adenoma. Next TCS 08/2016  . COLONOSCOPY N/A 07/26/2016   Dr. [name]: diverticulosis in sigmoid and descending colon. 6 mm benign splenic flexure. surveillance 5 yeras  . ESOPHAGOGASTRODUODENOSCOPY N/A 07/26/2016   Dr. [name]: normal esophagus, portal gastropathy, chronic gastritis, normal duodenum, screening EGD 2 years   . ESOPHAGOGASTRODUODENOSCOPY (EGD) WITH ESOPHAGEAL DILATION N/A 05/15/2013   PZW:CHENID esophagus-status post Venia Minks dilation/Portal gastropathy. Antral erosions-status post biopsy. extrinsic compression along lesser curvature likely secondary to splenomegaly. gastric bx benign  . HERNIA REPAIR  03/2009  . POLYPECTOMY  07/26/2016   Procedure: POLYPECTOMY;  Surgeon: [name], MD;  Location:  [facility];  Service: Endoscopy;;  colon  . TRANSTHORACIC ECHOCARDIOGRAM  2011   EF 93-23%, stage 1 diastolic dysfunction, trace TR & pulm valve regurg   . VASECTOMY  1989       Family History  Problem Relation Age of Onset  . Heart disease Mother   . Cancer Mother        lymph nodes  . Diabetes Mother   . Arthritis Other   . Lung  disease Other   . Asthma Other   . Kidney disease Other   . Ovarian cancer Sister   . Diabetes Brother   . Heart disease Brother   . Hypertension Brother   . Diabetes Sister   . Diabetes Sister   . Cirrhosis Sister 38       no etoh  . Colon cancer Neg Hx     Social History   Tobacco Use  . Smoking status: Former Smoker    Packs/day: 1.25    Years: 36.00    Pack years: 45.00    Types: Cigarettes    Quit date: 04/2011    Years since quitting: 9.0  . Smokeless tobacco: Former Network engineer  . Vaping Use: Never used  Substance Use Topics  . Alcohol use: No  . Drug use: No    Home Medications Prior to Admission medications   Medication Sig Start Date End Date Taking? Authorizing Provider  acetaminophen (TYLENOL) 500 MG tablet Take 1,000 mg by mouth daily as needed for headache. Patient not taking: Reported on 02/09/2020    [provider]  albuterol (PROVENTIL) (2.5 MG/3ML) 0.083% nebulizer solution Take 3 mLs (2.5 mg total) by nebulization every 2 (two) hours as needed for wheezing. Patient not taking: Reported on 02/09/2020 11/02/17   Sinda Du, MD  ALPRAZolam [name]) 0.25 MG tablet Take 0.25 mg by mouth 2 (two) times daily.     [provider]  aspirin EC 81 MG tablet Take 81 mg by mouth at bedtime.     [provider]  atorvastatin (LIPITOR) 40 MG tablet Take 1 tablet (40 mg total) by mouth daily. Please make annual appt with Dr. Debara Pickett for refills. [phone]. 3rd& final attempt. 08/10/19   Hilty, [name], MD  azithromycin (ZITHROMAX) 250 MG tablet Take 1 tablet (250 mg total) by mouth daily. Take first 2 tablets together, then 1 every day until finished. 04/17/20   Fredia Sorrow, MD  budesonide-formoterol [facility]) 160-4.5 MCG/ACT inhaler Inhale 2 puffs into the lungs 2 (two) times daily.    [provider]  diltiazem (CARDIZEM CD) 240 MG 24 hr capsule TAKE 1 CAPSULE BY MOUTH EVERY DAY 07/08/18   Hilty, [name], MD    gabapentin (NEURONTIN) 300 MG capsule Take 1 capsule by mouth at bedtime. 07/18/17   [provider]  HYDROcodone-acetaminophen (NORCO/VICODIN) 5-325 MG per tablet Take 1 tablet by mouth every 8 (eight) hours as needed for moderate pain. Patient not taking: Reported on 02/09/2020 11/09/14   [name], MD  HYDROcodone-acetaminophen (NORCO/VICODIN) 5-325 MG tablet Take 1 tablet by mouth every 6 (six) hours as needed. 04/17/20   Fredia Sorrow, MD  JARDIANCE 25 MG TABS tablet Take 25 mg by mouth every evening.  01/01/17   [provider]  Multiple Vitamin (MULITIVITAMIN WITH MINERALS) TABS Take 1 tablet by mouth at bedtime.     [provider]  Jonetta Speak LANCETS 27C MISC  08/26/18   [provider]  Roma Schanz test strip  08/26/18   [provider]  OXYGEN Inhale into the lungs. Pt uses oxygen at night and when exerting himself    [provider]  pantoprazole (PROTONIX) 40 MG tablet Take 1 tablet (40 mg total) by mouth daily before breakfast. 07/28/19   [name], PA-C  PROAIR HFA 108 [id number] Base) MCG/ACT inhaler Inhale 2 puffs into the lungs every 4 (four) hours as needed for wheezing or shortness of breath.  Patient not taking: Reported on 02/09/2020 08/29/15   [provider]  sertraline (ZOLOFT) 50 MG tablet Take 50 mg by mouth daily.    [provider]  SitaGLIPtin-MetFORMIN HCl (JANUMET XR) [phone] MG TB24 Take 1 tablet by mouth daily.    [provider]  SitaGLIPtin-MetFORMIN HCl [phone] MG TB24 Take by mouth.    [provider]  tamsulosin (FLOMAX) 0.4 MG CAPS capsule Take 0.4 mg by mouth daily.    [provider]  Tiotropium Bromide Monohydrate (SPIRIVA RESPIMAT) 2.5 MCG/ACT AERS Inhale 2 puffs into the lungs daily. 02/03/20   Tanda Rockers, MD  VITAMIN D PO Take 1 tablet by mouth daily.    [provider]    Allergies    Baclofen and Simvastatin  Review of  Systems   Review of Systems  Constitutional: Negative for chills and fever.  HENT: Positive for congestion. Negative for rhinorrhea and sore throat.   Eyes: Negative for visual disturbance.  Respiratory: Positive for cough and shortness of breath.   Cardiovascular: Negative for chest pain and leg swelling.  Gastrointestinal: Negative for abdominal pain, diarrhea, nausea and vomiting.  Genitourinary: Negative for dysuria.  Musculoskeletal: Positive for back pain. Negative for neck pain.  Skin: Negative for rash.  Neurological: Negative for dizziness, light-headedness and headaches.  Hematological: Does not bruise/bleed easily.  Psychiatric/Behavioral: Negative for confusion.    Physical Exam Updated Vital Signs BP 120/69   Pulse 77   Temp 97.8 F (36.6 C) (Oral)   Resp (!) 22   Ht 1.702 m (5' 7" )   Wt 74.8 kg   SpO2 100%   BMI 25.84 kg/m   Physical Exam Vitals and nursing note reviewed.  Constitutional:      Appearance: Normal appearance. He is well-developed.  HENT:     Head: Normocephalic and atraumatic.  Eyes:     Extraocular Movements: Extraocular movements intact.     Conjunctiva/sclera: Conjunctivae normal.     Pupils: Pupils are equal, round, and reactive to light.  Cardiovascular:     Rate and Rhythm: Normal rate and regular rhythm.     Heart sounds: No murmur heard.   Pulmonary:     Effort: Pulmonary effort is normal. No respiratory distress.     Breath sounds: Normal breath sounds. No wheezing.     Comments: Tender to palpate tender to palpation right posterior ribs Abdominal:     Palpations: Abdomen is soft.     Tenderness: There is no abdominal tenderness.  Musculoskeletal:        General: Normal range of motion.     Cervical back: Normal range of motion and neck supple.  Skin:    General: Skin is warm and dry.     Capillary Refill: Capillary refill takes less than 2 seconds.  Neurological:     General: No focal deficit present.     Mental Status:  He is alert and oriented to person, place, and time.     Cranial Nerves: No cranial nerve deficit.  Sensory: No sensory deficit.     Motor: No weakness.     ED Results / Procedures / Treatments   Labs (all labs ordered are listed, but only abnormal results are displayed) Labs Reviewed  CBC - Abnormal; Notable for the following components:      Result Value   MCH 25.7 (*)    Platelets 90 (*)    All other components within normal limits  BASIC METABOLIC PANEL - Abnormal; Notable for the following components:   Glucose, Bld 147 (*)    Calcium 8.7 (*)    All other components within normal limits  RESPIRATORY PANEL BY RT PCR (FLU A&B, COVID)  HEPATIC FUNCTION PANEL  LIPASE, BLOOD    EKG None  Radiology DG Chest 1 View  Result Date: 04/17/2020 CLINICAL DATA:  Cough since yesterday. EXAM: CHEST  1 VIEW COMPARISON:  July 29, 2018 FINDINGS: The mediastinal contour and cardiac silhouette are stable. Patchy consolidation of lateral left lung base is identified. Stable nodules of the right upper lobe are noted. Scarring versus atelectasis of right mid lung is identified unchanged. Bony structures are stable. IMPRESSION: 1. Patchy consolidation of lateral left lung base suspicious for pneumonia. 2. Stable nodules of the right upper lobe. Electronically Signed   By: Abelardo Diesel M.D.   On: 04/17/2020 10:28   CT Angio Chest PE W/Cm &/Or Wo Cm  Result Date: 04/17/2020 CLINICAL DATA:  Right-sided abdominal chest pain for 2 days. EXAM: CT ANGIOGRAPHY CHEST CT ABDOMEN AND PELVIS WITH CONTRAST TECHNIQUE: Multidetector CT imaging of the chest was performed using the standard protocol during bolus administration of intravenous contrast. Multiplanar CT image reconstructions and MIPs were obtained to evaluate the vascular anatomy. Multidetector CT imaging of the abdomen and pelvis was performed using the standard protocol during bolus administration of intravenous contrast. CONTRAST:  157m  OMNIPAQUE IOHEXOL 350 MG/ML SOLN COMPARISON:  CT chest 02/03/2020 FINDINGS: CTA CHEST FINDINGS Cardiovascular: Normal heart size. Thoracic aortic vascular calcifications. Motion artifact limits evaluation of the distal pulmonary arteries. No definite filling defect identified to suggest acute pulmonary embolus. Mediastinum/Nodes: No enlarged axillary, mediastinal or hilar lymphadenopathy. Normal appearance of the esophagus. Lungs/Pleura: Central airways are patent. Slight interval improvement in previously visualized ground-glass nodularity within the right middle lobe. Similar-appearing patchy ground-glass nodularity within the lingula and peripheral left lower lobe. Calcified granulomas are redemonstrated. No pleural effusion or pneumothorax. Musculoskeletal: Nondisplaced posterior right ninth rib fracture (image 201; series 5). Review of the MIP images confirms the above findings. CT ABDOMEN and PELVIS FINDINGS Hepatobiliary: Liver is normal in size and contour. Gallbladder is unremarkable. Pancreas: Unremarkable Spleen: Subcentimeter too small to characterize low-attenuation lesion in the spleen. Adrenals/Urinary Tract: Kidneys enhance symmetrically with contrast. Bilateral renal cysts. No hydronephrosis. Urinary bladder is unremarkable. Stomach/Bowel: Sigmoid colonic diverticulosis. No CT evidence for acute diverticulitis. No evidence for bowel obstruction. No free fluid or free intraperitoneal air. Vascular/Lymphatic: Normal caliber abdominal aorta. Peripheral calcified atherosclerotic plaque. No retroperitoneal lymphadenopathy. Reproductive: Heterogeneous prostate. Other: Small fat containing right inguinal hernia. Musculoskeletal: Lower thoracic and lumbar spine degenerative changes. No aggressive or acute appearing osseous lesions. Redemonstrated L1 wedge compression deformity and superior endplate compression deformity of the T11 vertebral body. Review of the MIP images confirms the above findings.  IMPRESSION: 1. No evidence for acute pulmonary embolus. 2. Nondisplaced posterior right ninth rib fracture. 3. Slight interval improvement in previously visualized ground-glass nodularity within the right middle lobe. Similar-appearing patchy ground-glass nodularity within the lingula and peripheral left lower lobe.  Findings are nonspecific and may be secondary to an infectious/inflammatory process. 4. No acute process within the abdomen or pelvis. 5. Aortic atherosclerosis. Electronically Signed   By: Lovey Newcomer M.D.   On: 04/17/2020 13:25   CT Abdomen Pelvis W Contrast  Result Date: 04/17/2020 CLINICAL DATA:  Right-sided abdominal chest pain for 2 days. EXAM: CT ANGIOGRAPHY CHEST CT ABDOMEN AND PELVIS WITH CONTRAST TECHNIQUE: Multidetector CT imaging of the chest was performed using the standard protocol during bolus administration of intravenous contrast. Multiplanar CT image reconstructions and MIPs were obtained to evaluate the vascular anatomy. Multidetector CT imaging of the abdomen and pelvis was performed using the standard protocol during bolus administration of intravenous contrast. CONTRAST:  182m OMNIPAQUE IOHEXOL 350 MG/ML SOLN COMPARISON:  CT chest 02/03/2020 FINDINGS: CTA CHEST FINDINGS Cardiovascular: Normal heart size. Thoracic aortic vascular calcifications. Motion artifact limits evaluation of the distal pulmonary arteries. No definite filling defect identified to suggest acute pulmonary embolus. Mediastinum/Nodes: No enlarged axillary, mediastinal or hilar lymphadenopathy. Normal appearance of the esophagus. Lungs/Pleura: Central airways are patent. Slight interval improvement in previously visualized ground-glass nodularity within the right middle lobe. Similar-appearing patchy ground-glass nodularity within the lingula and peripheral left lower lobe. Calcified granulomas are redemonstrated. No pleural effusion or pneumothorax. Musculoskeletal: Nondisplaced posterior right ninth rib  fracture (image 201; series 5). Review of the MIP images confirms the above findings. CT ABDOMEN and PELVIS FINDINGS Hepatobiliary: Liver is normal in size and contour. Gallbladder is unremarkable. Pancreas: Unremarkable Spleen: Subcentimeter too small to characterize low-attenuation lesion in the spleen. Adrenals/Urinary Tract: Kidneys enhance symmetrically with contrast. Bilateral renal cysts. No hydronephrosis. Urinary bladder is unremarkable. Stomach/Bowel: Sigmoid colonic diverticulosis. No CT evidence for acute diverticulitis. No evidence for bowel obstruction. No free fluid or free intraperitoneal air. Vascular/Lymphatic: Normal caliber abdominal aorta. Peripheral calcified atherosclerotic plaque. No retroperitoneal lymphadenopathy. Reproductive: Heterogeneous prostate. Other: Small fat containing right inguinal hernia. Musculoskeletal: Lower thoracic and lumbar spine degenerative changes. No aggressive or acute appearing osseous lesions. Redemonstrated L1 wedge compression deformity and superior endplate compression deformity of the T11 vertebral body. Review of the MIP images confirms the above findings. IMPRESSION: 1. No evidence for acute pulmonary embolus. 2. Nondisplaced posterior right ninth rib fracture. 3. Slight interval improvement in previously visualized ground-glass nodularity within the right middle lobe. Similar-appearing patchy ground-glass nodularity within the lingula and peripheral left lower lobe. Findings are nonspecific and may be secondary to an infectious/inflammatory process. 4. No acute process within the abdomen or pelvis. 5. Aortic atherosclerosis. Electronically Signed   By: DLovey NewcomerM.D.   On: 04/17/2020 13:25    Procedures Procedures (including critical care time)  Medications Ordered in ED Medications  cefTRIAXone (ROCEPHIN) 1 g in sodium chloride 0.9 % 100 mL IVPB ( Intravenous Stopped 04/17/20 1233)  azithromycin (ZITHROMAX) 500 mg in sodium chloride 0.9 % 250 mL  IVPB ( Intravenous Stopped 04/17/20 1356)  iohexol (OMNIPAQUE) 350 MG/ML injection 100 mL (100 mLs Intravenous Contrast Given 04/17/20 1218)    ED Course  I have reviewed the triage vital signs and the nursing notes.  Pertinent labs & imaging results that were available during my care of the patient were reviewed by me and considered in my medical decision making (see chart for details).    MDM Rules/Calculators/A&P                           Chest x-ray did not give an answer to the right sided rib  pain.  But it did show concern for pneumonia on the left side.  Based on this CT scan of the chest as an angiography to rule out pulmonary embolus was done.  Also did abdomen and pelvis since his pain was kind of over the posterior lower rib area and there was concern for possible right upper quadrant intra-abdominal problems as well.  Work-up on that shows a right rib fracture which gives the answer to that.  Raises some questions of pneumonia.  The patient already been treated with Rocephin and Zithromax will be continued on Zithromax at home we will give an incentive spirometer and pain medicine and follow-up with primary care doctor.  Covid testing was negative no leukocytosis   Final Clinical Impression(s) / ED Diagnoses Final diagnoses:  Closed fracture of one rib of right side, initial encounter  Community acquired pneumonia of left lower lobe of lung    Rx / DC Orders ED Discharge Orders         Ordered    HYDROcodone-acetaminophen (NORCO/VICODIN) 5-325 MG tablet  Every 6 hours PRN        04/17/20 1425    azithromycin (ZITHROMAX) 250 MG tablet  Daily        04/17/20 1425           Fredia Sorrow, MD 04/17/20 1431

## 2020-04-17 NOTE — ED Notes (Signed)
RN attempted to give pt instruction on Incentive Spirometer but pt and wife both reported they were very familiar with it and didn't need additional teaching. Incentive Spirometer given to pt upon discharge.

## 2020-04-17 NOTE — Discharge Instructions (Signed)
Use incentive spirometer to help exercise and lungs.  Take pain medicine as directed.  Take the antibiotic for the pneumonia starting tomorrow.  Make an appointment follow-up with your regular doctor.  Return for any new or worse symptoms.

## 2020-04-19 DIAGNOSIS — Z0001 Encounter for general adult medical examination with abnormal findings: Secondary | ICD-10-CM | POA: Diagnosis not present

## 2020-04-19 DIAGNOSIS — Z125 Encounter for screening for malignant neoplasm of prostate: Secondary | ICD-10-CM | POA: Diagnosis not present

## 2020-04-26 DIAGNOSIS — I10 Essential (primary) hypertension: Secondary | ICD-10-CM | POA: Diagnosis not present

## 2020-04-26 DIAGNOSIS — E785 Hyperlipidemia, unspecified: Secondary | ICD-10-CM | POA: Diagnosis not present

## 2020-04-26 DIAGNOSIS — D690 Allergic purpura: Secondary | ICD-10-CM | POA: Diagnosis not present

## 2020-04-26 DIAGNOSIS — J9611 Chronic respiratory failure with hypoxia: Secondary | ICD-10-CM | POA: Diagnosis not present

## 2020-04-26 DIAGNOSIS — Z23 Encounter for immunization: Secondary | ICD-10-CM | POA: Diagnosis not present

## 2020-04-26 DIAGNOSIS — Z6827 Body mass index (BMI) 27.0-27.9, adult: Secondary | ICD-10-CM | POA: Diagnosis not present

## 2020-04-26 DIAGNOSIS — E1140 Type 2 diabetes mellitus with diabetic neuropathy, unspecified: Secondary | ICD-10-CM | POA: Diagnosis not present

## 2020-04-28 ENCOUNTER — Ambulatory Visit: Payer: PPO | Admitting: Internal Medicine

## 2020-04-28 ENCOUNTER — Other Ambulatory Visit: Payer: Self-pay

## 2020-04-28 ENCOUNTER — Encounter: Payer: Self-pay | Admitting: Internal Medicine

## 2020-04-28 DIAGNOSIS — J9611 Chronic respiratory failure with hypoxia: Secondary | ICD-10-CM | POA: Diagnosis not present

## 2020-04-28 DIAGNOSIS — J449 Chronic obstructive pulmonary disease, unspecified: Secondary | ICD-10-CM

## 2020-04-28 NOTE — Patient Instructions (Addendum)
Work on inhaler technique:  relax and gently blow all the way out then take a nice smooth deep breath back in, triggering the inhaler at same time you start breathing in.  Hold for up to 5 seconds if you can. Blow symbicort  out thru nose. Rinse and gargle with water when done  Make sure you check your oxygen saturations at highest level of activity to be sure it stays over 90% and adjust  02 flow upward to maintain this level if needed but remember to turn it back to previous settings when you stop (to conserve your supply).   If your medications are not affordable on your new plan bring in your drug formulary and I will pick the best option.    Please schedule a follow up visit in 6 months but call sooner if needed

## 2020-04-28 NOTE — Progress Notes (Signed)
[name], male    DOB: Jul 11, 1955     MRN: [id number]   Brief patient profile:  37 yowm MM/quit smoking 2012 with COPD GOLD III criteria 06/17/20 previously followed by Dr [name] referred back to pulmonary clinic 12/09/2019 in [place].   History of Present Illness  12/09/2019  Pulmonary/ 1st office eval/[name]  symbicort 160/spiriva  Chief Complaint  Patient presents with  . Pulmonary Consult    Former patient of Dr [name].  Breathing has been worse over the past few wks. Recently treated with Doxy per PCP for COPD exacerbation. He is occ coughing up some green sputum.   flare 11/23/19  rx doxy 5/19 x 7 days  Dyspnea:  Baseline uses HC parking / MMRC3 = can't walk 100 yards even at a slow pace at a flat grade s stopping due to sob  On 2lpm  Cough: improved less purulent purulent  Sleep: wakes up coughing since onset /sometimes uses saba proair  SABA use: rarely daytime rec Continue protonix 40 mg Take 30-60 min before first meal of the day  GERD diet  Plan A = Automatic = Always=    Budesonide/formoterol (symbicort ) 2 puffs first thing in am and 12 hours later and use spiriva x 2 pffs after the morning symbicort (ok to change back to the powder after 2 weeks but will need to stop it if you start coughing  Work on inhaler technique  Plan B = Backup (to supplement plan A, not to replace it) Only use your albuterol inhaler as a rescue medication    Plan C = Crisis (instead of Plan B   Make sure you check your oxygen saturations at highest level of activity to be sure it stays over 90%   Prednisone 10 mg take  4 each am x 2 days,   2 each am x 2 days,  1 each am x 2 days and stop  For cough > mucinex dm 1200 mg every 12 hours as needed  Would wait another 2 weeks before you get your first shot for covid 19    02/03/2020  f/u ov/[name] re: GOLD III/ 02 dep/ rx symb /spriva powder  Chief Complaint  Patient presents with  . Acute Visit    shortness of breath with exertion, productive  cough with green phlegm  Dyspnea: abruptly worse x 48 h = MMRC3 = can't walk 100 yards even at a slow pace at a flat grade s stopping due to sob   Cough: worse with green mucus/ worse since changed back to dpi spiriva Sleeping: 2 am tends to cramp SABA use: none on day of ov  02: 2lpm  Up to 2.5 lpm  rec zpak Prednisone 10 mg take  4 each am x 2 days,   2 each am x 2 days,  1 each am x 2 days and stop  Change spiriva to respimat 2 puff each am  For cough mucinex dm up to 1200 mg every 12 hours and supplement hydocodone every 4 hours as needed  Get your covid vaccination asap We will call [employer] for a best fit analysis for portable 0xygen  In meantime, Make sure you check your oxygen saturations at highest level of activity to be sure it stays over 90% and adjust upward to maintain this level if needed but remember to turn it back to previous settings when you stop (to conserve your supply).    04/28/2020  f/u ov/[name] office/[name] re: GOLD III/ 02  dep maint rx spiriva/symbicort 160 Chief Complaint  Patient presents with  . Follow-up    Pt states dx with PNA on 04/17/20- treated with zpack.  He states his breathing is slowly improving since then.  He is using his albuterol inhaler 2 x per wk and he has not used neb.  Dyspnea:  [employer] x slow on 02 3lpm x 3 ailes , not checking sats   Cough: better, no more green mucus  Sleeping: 2 pillows bed is flat  SABA use: hfa couple times a week, not neb 02: 3lpm 24/7    No obvious day to day or daytime variability or assoc excess/ purulent sputum or mucus plugs or hemoptysis or cp or chest tightness, subjective wheeze or overt sinus or hb symptoms.   Sleeping as above without nocturnal  or early am exacerbation  of respiratory  c/o's or need for noct saba. Also denies any obvious fluctuation of symptoms with weather or environmental changes or other aggravating or alleviating factors except as outlined above   No unusual exposure hx or h/o  childhood pna/ asthma or knowledge of premature birth.  Current Allergies, Complete Past Medical History, Past Surgical History, Family History, and Social History were reviewed in [employer] record.  ROS  The following are not active complaints unless bolded Hoarseness, sore throat, dysphagia, dental problems, itching, sneezing,  nasal congestion or discharge of excess mucus or purulent secretions, ear ache,   fever, chills, sweats, unintended wt loss or wt gain, classically pleuritic or exertional cp,  orthopnea pnd or arm/hand swelling  or leg swelling, presyncope, palpitations, abdominal pain, anorexia, nausea, vomiting, diarrhea  or change in bowel habits or change in bladder habits, change in stools or change in urine, dysuria, hematuria,  rash, arthralgias, visual complaints, headache, numbness, weakness or ataxia or problems with walking or coordination,  change in mood or  memory.        Current Meds  Medication Sig  . acetaminophen (TYLENOL) 500 MG tablet Take 1,000 mg by mouth daily as needed for headache.   . albuterol (PROVENTIL) (2.5 MG/3ML) 0.083% nebulizer solution Take 3 mLs (2.5 mg total) by nebulization every 2 (two) hours as needed for wheezing.  [name] ALPRAZolam (XANAX) 0.25 MG tablet Take 0.25 mg by mouth 2 (two) times daily.   [name] aspirin EC 81 MG tablet Take 81 mg by mouth at bedtime.   [name] atorvastatin (LIPITOR) 40 MG tablet Take 1 tablet (40 mg total) by mouth daily. Please make annual appt with Dr. Debara [name] for refills. [phone]. 3rd& final attempt.  . budesonide-formoterol (SYMBICORT) 160-4.5 MCG/ACT inhaler Inhale 2 puffs into the lungs 2 (two) times daily.  [name] diltiazem (CARDIZEM CD) 240 MG 24 hr capsule TAKE 1 CAPSULE BY MOUTH EVERY DAY  . gabapentin (NEURONTIN) 300 MG capsule Take 1 capsule by mouth at bedtime.  [name] glimepiride (AMARYL) 1 MG tablet Take 1 mg by mouth daily.  [name] HYDROcodone-acetaminophen (NORCO/VICODIN) 5-325 MG per tablet Take 1 tablet  by mouth every 8 (eight) hours as needed for moderate pain.  [name] HYDROcodone-acetaminophen (NORCO/VICODIN) 5-325 MG tablet Take 1 tablet by mouth every 6 (six) hours as needed.  [name] JARDIANCE 25 MG TABS tablet Take 25 mg by mouth every evening.   . Multiple Vitamin (MULITIVITAMIN WITH MINERALS) TABS Take 1 tablet by mouth at bedtime.   Glory Rosebush DELICA LANCETS 93Z MISC   . ONETOUCH VERIO test strip   . OXYGEN Inhale into the lungs. Pt uses oxygen  at night and when exerting himself  . pantoprazole (PROTONIX) 40 MG tablet Take 1 tablet (40 mg total) by mouth daily before breakfast.  . PROAIR HFA 108 (90 Base) MCG/ACT inhaler Inhale 2 puffs into the lungs every 4 (four) hours as needed for wheezing or shortness of breath.   . sertraline (ZOLOFT) 50 MG tablet Take 50 mg by mouth daily.  . SitaGLIPtin-MetFORMIN HCl (JANUMET XR) [phone] MG TB24 Take 1 tablet by mouth daily.  . SitaGLIPtin-MetFORMIN HCl [phone] MG TB24 Take by mouth.  . tamsulosin (FLOMAX) 0.4 MG CAPS capsule Take 0.4 mg by mouth daily.  . Tiotropium Bromide Monohydrate (SPIRIVA RESPIMAT) 2.5 MCG/ACT AERS Inhale 2 puffs into the lungs daily.  [name] VITAMIN D PO Take 1 tablet by mouth daily.                     Past Medical History:  Diagnosis Date  . Anxiety   . Asthma   . Chest pain    a. 2003 Cath: nl cors.  [name] COPD (chronic obstructive pulmonary disease) ([place])   . Depression   . Diabetes mellitus   . Dyslipidemia   . Dysrhythmia   . Fracture 12/15   lower back "L-1"  . GERD (gastroesophageal reflux disease)   . Hypertension   . PONV (postoperative nausea and vomiting)   . Splenomegaly        Objective:    04/28/2020      166   12/30/19 162 lb (73.5 kg)  12/21/19 162 lb (73.5 kg)  12/09/19 162 lb (73.5 kg)      Vital signs reviewed  04/28/2020  - Note at rest 02 sats  95% on 3lpm pulse tank     slt hoarse amb wm nad     HEENT : pt wearing mask not removed for exam due to covid -19 concerns.    NECK  :  without JVD/Nodes/TM/ nl carotid upstrokes bilaterally   LUNGS: no acc muscle use,  Mod barrel  contour chest wall with bilateral  Distant bs s audible wheeze and  without cough on insp or exp maneuvers and mod  Hyperresonant  to  percussion bilaterally     CV:  RRR  no s3 or murmur or increase in P2, and no edema   ABD:  soft and nontender with pos mid insp Hoover's  in the supine position. No bruits or organomegaly appreciated, bowel sounds nl  MS:     ext warm without deformities, calf tenderness, cyanosis or clubbing No obvious joint restrictions   SKIN: warm and dry without lesions    NEURO:  alert, approp, nl sensorium with  no motor or cerebellar deficits apparent.             I personally reviewed images and agree with radiology impression as follows:   Chest CTa 04/17/20 1. No evidence for acute pulmonary embolus. 2. Nondisplaced posterior right ninth rib fracture. 3. Slight interval improvement in previously visualized ground-glass nodularity within the right middle lobe. Similar-appearing patchy ground-glass nodularity within the lingula and peripheral left lower lobe. Findings are nonspecific and may be secondary to an infectious/inflammatory process.     Assessment

## 2020-04-29 ENCOUNTER — Encounter: Payer: Self-pay | Admitting: Internal Medicine

## 2020-04-29 ENCOUNTER — Ambulatory Visit: Payer: PPO | Admitting: Internal Medicine

## 2020-04-29 NOTE — Assessment & Plan Note (Signed)
Started on 02 around 2019 As of 02/03/2020  2lpm 24/7  -  02/03/2020 referred to [employer] for Best fit for amb 02 > 3lpm pulsed tank -  04/28/2020   Walked 3lpm pulsed tank  approx   400 ft  @ moderate pace  stopped due to  Sob/tired  with sats still 95%   rec more regular sub max exercise and monitor sats occasionally at peak ex to make sure > 90%    >>> f/u q 6 m, call sooner if needed          Each maintenance medication was reviewed in detail including emphasizing most importantly the difference between maintenance and prns and under what circumstances the prns are to be triggered using an action plan format where appropriate.  Total time for H and P, chart review, counseling,  directly observing portions of ambulatory 02 saturation study/  and generating customized AVS unique to this office visit / charting = 23 min

## 2020-04-29 NOTE — Assessment & Plan Note (Signed)
Quit smoking 2012 - alpha one AT screen  01/31/17  MM  Level 169 - PFT's  06/18/2019  FEV1 1.12 (35 % ) ratio 0.46  p 0 % improvement from saba p ? prior to study with DLCO  11.91 (48%) corrects to 3.18 (754%)  for alv volume and FV curve classic concavity   - 12/09/2019  After extensive coaching inhaler device,  effectiveness =    90% with smi so try spiriva respimat 2 bid instead of DPI  - 04/28/2020  After extensive coaching inhaler device,  effectiveness =    75% (short ti, good trigger)    Group D in terms of symptom/risk and laba/lama/ICS  therefore appropriate rx at this point >>>  symb 160/spiriva vs other options depending on his new insurance as of the first of the year.  Advised:  formulary restrictions will be an ongoing challenge for the forseable future and I would be happy to pick an alternative if the pt will first  provide me a list of them -  pt  will need to return here for training for any new device that is required eg dpi vs hfa vs respimat.    In the meantime we can always provide samples so that the patient never runs out of any needed respiratory medications.

## 2020-05-03 DIAGNOSIS — J449 Chronic obstructive pulmonary disease, unspecified: Secondary | ICD-10-CM | POA: Diagnosis not present

## 2020-05-18 ENCOUNTER — Other Ambulatory Visit: Payer: Self-pay | Admitting: Gastroenterology

## 2020-05-18 NOTE — Telephone Encounter (Signed)
Patient hasn't been seen in over 2 years. I am not able to refill his medication. He is going to need an OV for additional refills.

## 2020-05-19 ENCOUNTER — Encounter: Payer: Self-pay | Admitting: Internal Medicine

## 2020-05-19 NOTE — Telephone Encounter (Signed)
Sent letter that patient was due for follow up

## 2020-05-19 NOTE — Telephone Encounter (Signed)
Noted. Letter was mailed to pt.

## 2020-05-23 ENCOUNTER — Emergency Department (HOSPITAL_COMMUNITY): Payer: PPO

## 2020-05-23 ENCOUNTER — Encounter (HOSPITAL_COMMUNITY): Payer: Self-pay

## 2020-05-23 ENCOUNTER — Ambulatory Visit: Payer: PPO | Admitting: Internal Medicine

## 2020-05-23 ENCOUNTER — Other Ambulatory Visit: Payer: Self-pay

## 2020-05-23 ENCOUNTER — Encounter: Payer: Self-pay | Admitting: Internal Medicine

## 2020-05-23 ENCOUNTER — Inpatient Hospital Stay (HOSPITAL_COMMUNITY)
Admission: EM | Admit: 2020-05-23 | Discharge: 2020-05-25 | DRG: 193 | Disposition: A | Discharge status: Home / Self Care | Payer: PPO | Source: Ambulatory Visit | Attending: Internal Medicine | Admitting: Internal Medicine

## 2020-05-23 DIAGNOSIS — J449 Chronic obstructive pulmonary disease, unspecified: Secondary | ICD-10-CM

## 2020-05-23 DIAGNOSIS — K7581 Nonalcoholic steatohepatitis (NASH): Secondary | ICD-10-CM | POA: Diagnosis present

## 2020-05-23 DIAGNOSIS — J9611 Chronic respiratory failure with hypoxia: Secondary | ICD-10-CM | POA: Diagnosis not present

## 2020-05-23 DIAGNOSIS — J440 Chronic obstructive pulmonary disease with acute lower respiratory infection: Secondary | ICD-10-CM | POA: Diagnosis present

## 2020-05-23 DIAGNOSIS — Z79899 Other long term (current) drug therapy: Secondary | ICD-10-CM

## 2020-05-23 DIAGNOSIS — F32A Depression, unspecified: Secondary | ICD-10-CM | POA: Diagnosis present

## 2020-05-23 DIAGNOSIS — J9811 Atelectasis: Secondary | ICD-10-CM | POA: Diagnosis not present

## 2020-05-23 DIAGNOSIS — E119 Type 2 diabetes mellitus without complications: Secondary | ICD-10-CM

## 2020-05-23 DIAGNOSIS — J441 Chronic obstructive pulmonary disease with (acute) exacerbation: Secondary | ICD-10-CM | POA: Diagnosis not present

## 2020-05-23 DIAGNOSIS — Z8249 Family history of ischemic heart disease and other diseases of the circulatory system: Secondary | ICD-10-CM

## 2020-05-23 DIAGNOSIS — E785 Hyperlipidemia, unspecified: Secondary | ICD-10-CM | POA: Diagnosis present

## 2020-05-23 DIAGNOSIS — Z23 Encounter for immunization: Secondary | ICD-10-CM | POA: Diagnosis not present

## 2020-05-23 DIAGNOSIS — Z9981 Dependence on supplemental oxygen: Secondary | ICD-10-CM

## 2020-05-23 DIAGNOSIS — K7460 Unspecified cirrhosis of liver: Secondary | ICD-10-CM | POA: Diagnosis present

## 2020-05-23 DIAGNOSIS — Z888 Allergy status to other drugs, medicaments and biological substances status: Secondary | ICD-10-CM

## 2020-05-23 DIAGNOSIS — Z7951 Long term (current) use of inhaled steroids: Secondary | ICD-10-CM | POA: Diagnosis not present

## 2020-05-23 DIAGNOSIS — J9621 Acute and chronic respiratory failure with hypoxia: Secondary | ICD-10-CM | POA: Diagnosis present

## 2020-05-23 DIAGNOSIS — Z87891 Personal history of nicotine dependence: Secondary | ICD-10-CM | POA: Diagnosis not present

## 2020-05-23 DIAGNOSIS — I10 Essential (primary) hypertension: Secondary | ICD-10-CM | POA: Diagnosis not present

## 2020-05-23 DIAGNOSIS — Z20822 Contact with and (suspected) exposure to covid-19: Secondary | ICD-10-CM | POA: Diagnosis not present

## 2020-05-23 DIAGNOSIS — Z7982 Long term (current) use of aspirin: Secondary | ICD-10-CM

## 2020-05-23 DIAGNOSIS — K219 Gastro-esophageal reflux disease without esophagitis: Secondary | ICD-10-CM | POA: Diagnosis not present

## 2020-05-23 DIAGNOSIS — E1165 Type 2 diabetes mellitus with hyperglycemia: Secondary | ICD-10-CM | POA: Diagnosis not present

## 2020-05-23 DIAGNOSIS — F419 Anxiety disorder, unspecified: Secondary | ICD-10-CM | POA: Diagnosis not present

## 2020-05-23 DIAGNOSIS — J189 Pneumonia, unspecified organism: Secondary | ICD-10-CM | POA: Diagnosis present

## 2020-05-23 DIAGNOSIS — K7400 Hepatic fibrosis, unspecified: Secondary | ICD-10-CM | POA: Diagnosis present

## 2020-05-23 DIAGNOSIS — I517 Cardiomegaly: Secondary | ICD-10-CM | POA: Diagnosis not present

## 2020-05-23 DIAGNOSIS — Z833 Family history of diabetes mellitus: Secondary | ICD-10-CM

## 2020-05-23 DIAGNOSIS — Z7984 Long term (current) use of oral hypoglycemic drugs: Secondary | ICD-10-CM

## 2020-05-23 DIAGNOSIS — R0602 Shortness of breath: Secondary | ICD-10-CM | POA: Diagnosis not present

## 2020-05-23 DIAGNOSIS — J181 Lobar pneumonia, unspecified organism: Principal | ICD-10-CM

## 2020-05-23 DIAGNOSIS — R059 Cough, unspecified: Secondary | ICD-10-CM | POA: Diagnosis not present

## 2020-05-23 DIAGNOSIS — Z8701 Personal history of pneumonia (recurrent): Secondary | ICD-10-CM

## 2020-05-23 DIAGNOSIS — Z825 Family history of asthma and other chronic lower respiratory diseases: Secondary | ICD-10-CM | POA: Diagnosis not present

## 2020-05-23 DIAGNOSIS — J811 Chronic pulmonary edema: Secondary | ICD-10-CM | POA: Diagnosis not present

## 2020-05-23 LAB — URINALYSIS, ROUTINE W REFLEX MICROSCOPIC
Bacteria, UA: NONE SEEN
Bilirubin Urine: NEGATIVE
Glucose, UA: >=500 mg/dL — AB
Hgb urine dipstick: NEGATIVE
Ketones, ur: NEGATIVE mg/dL
Leukocytes,Ua: NEGATIVE
Nitrite: NEGATIVE
Protein, ur: NEGATIVE mg/dL
Specific Gravity, Urine: 1.02 (ref 1.005–1.030)
pH: 5 (ref 5.0–8.0)

## 2020-05-23 LAB — CBC WITH DIFFERENTIAL/PLATELET
Abs Immature Granulocytes: 0.04 10*3/uL (ref 0.00–0.07)
Basophils Absolute: 0 10*3/uL (ref 0.0–0.1)
Basophils Relative: 0 %
Eosinophils Absolute: 0.1 10*3/uL (ref 0.0–0.5)
Eosinophils Relative: 1 %
HCT: 48.9 % (ref 39.0–52.0)
Hemoglobin: 14.9 g/dL (ref 13.0–17.0)
Immature Granulocytes: 0 %
Lymphocytes Relative: 11 %
Lymphs Abs: 1.2 10*3/uL (ref 0.7–4.0)
MCH: 24.8 pg — ABNORMAL LOW (ref 26.0–34.0)
MCHC: 30.5 g/dL (ref 30.0–36.0)
MCV: 81.2 fL (ref 80.0–100.0)
Monocytes Absolute: 0.7 10*3/uL (ref 0.1–1.0)
Monocytes Relative: 6 %
Neutro Abs: 8.4 10*3/uL — ABNORMAL HIGH (ref 1.7–7.7)
Neutrophils Relative %: 82 %
Platelets: 86 10*3/uL — ABNORMAL LOW (ref 150–400)
RBC: 6.02 MIL/uL — ABNORMAL HIGH (ref 4.22–5.81)
RDW: 15 % (ref 11.5–15.5)
WBC: 10.4 10*3/uL (ref 4.0–10.5)
nRBC: 0 % (ref 0.0–0.2)

## 2020-05-23 LAB — COMPREHENSIVE METABOLIC PANEL
ALT: 27 U/L (ref 0–44)
AST: 18 U/L (ref 15–41)
Albumin: 4 g/dL (ref 3.5–5.0)
Alkaline Phosphatase: 82 U/L (ref 38–126)
Anion gap: 11 (ref 5–15)
BUN: 22 mg/dL (ref 8–23)
CO2: 25 mmol/L (ref 22–32)
Calcium: 9.2 mg/dL (ref 8.9–10.3)
Chloride: 99 mmol/L (ref 98–111)
Creatinine, Ser: 1.06 mg/dL (ref 0.61–1.24)
GFR, Estimated: >60 mL/min (ref >60)
Glucose, Bld: 184 mg/dL — ABNORMAL HIGH (ref 70–99)
Potassium: 4 mmol/L (ref 3.5–5.1)
Sodium: 135 mmol/L (ref 135–145)
Total Bilirubin: 1.4 mg/dL — ABNORMAL HIGH (ref 0.3–1.2)
Total Protein: 8.5 g/dL — ABNORMAL HIGH (ref 6.5–8.1)

## 2020-05-23 LAB — RESPIRATORY PANEL BY RT PCR (FLU A&B, COVID)
Influenza A by PCR: NEGATIVE
Influenza B by PCR: NEGATIVE
SARS Coronavirus 2 by RT PCR: NEGATIVE

## 2020-05-23 LAB — BRAIN NATRIURETIC PEPTIDE: B Natriuretic Peptide: 581 pg/mL — ABNORMAL HIGH (ref 0.0–100.0)

## 2020-05-23 MED ORDER — DEXAMETHASONE SODIUM PHOSPHATE 10 MG/ML IJ SOLN
10.0000 mg | Freq: Once | INTRAMUSCULAR | Status: AC
  Administered 2020-05-23: 10 mg via INTRAVENOUS
  Filled 2020-05-23: qty 1

## 2020-05-23 MED ORDER — SODIUM CHLORIDE 0.9 % IV SOLN
500.0000 mg | Freq: Once | INTRAVENOUS | Status: AC
  Administered 2020-05-23: 500 mg via INTRAVENOUS
  Filled 2020-05-23: qty 500

## 2020-05-23 MED ORDER — SODIUM CHLORIDE 0.9 % IV SOLN
1.0000 g | Freq: Once | INTRAVENOUS | Status: AC
  Administered 2020-05-23: 1 g via INTRAVENOUS
  Filled 2020-05-23: qty 10

## 2020-05-23 NOTE — Patient Instructions (Addendum)
I recommend you go to the ER for evaluation for possible pneumonia.

## 2020-05-23 NOTE — ED Triage Notes (Signed)
Pt presents to ED with complaints of increased SOB and cough since Saturday. Pt went to see Dr Melvyn Novas and was sent to ED. Pt chronically on 2L of O2, was increased to 4L at Dr [facility] office, sats remain upper 80's

## 2020-05-23 NOTE — H&P (Addendum)
History and Physical    [name]:[id number] DOB: 1955-11-22 DOA: 05/23/2020  PCP: Asencion Noble, MD   Patient coming from: Home  I have personally briefly reviewed patient's old medical records in [place]  Chief Complaint: Difficulty breathing  HPI: [name] is a 64 y.o. male with medical history significant for COPD-, [name] liver cirrhosis, hypertension, diabetes mellitus. Patient went to see his pulmonologist Dr. Melvyn Novas today with complaints of difficulty breathing and cough.  He was referred to the ED, as there was a concern for pneumonia and his O2 sats were in the 80s on his home 2 L of oxygen, and was required 4 L.  Patient reports difficulty breathing and cough progressing over the past 3 days.  He denies chest pain.  No lower extremity swelling.  He is unaware of any fevers or chills.  Denies difficulty swallowing when eating.  No vomiting.  ED Course: O2 sat 90% on 4 L.  Temperature 98.7.  Heart rate initially in the 120s improved to 100, mild tachypnea to 23.  Blood pressure systolic 1 1 3-1 26.  Covid test negative.  WBC 10.7.  BNP 581.  Portable airspace opacity at the periphery of the right lung base which could be atelectasis and/or infectious etiology.  Patient was started on IV ceftriaxone and azithromycin, 10 mg dexamethasone given.  Hospitalist admit for acute respiratory failure and pneumonia.  Review of Systems: As per HPI all other systems reviewed and negative.  Past Medical History:  Diagnosis Date  . Anxiety   . Asthma   . Chest pain    a. 2003 Cath: nl cors.  [name] COPD (chronic obstructive pulmonary disease) ([place])   . Depression   . Diabetes mellitus   . Dyslipidemia   . Dysrhythmia   . Fracture 12/15   lower back "L-1"  . GERD (gastroesophageal reflux disease)   . Hypertension   . PONV (postoperative nausea and vomiting)   . Splenomegaly     Past Surgical History:  Procedure Laterality Date  . BIOPSY  07/26/2016   Procedure: BIOPSY;   Surgeon: [name], MD;  Location: [facility];  Service: Endoscopy;;  gastric  . CARDIAC CATHETERIZATION  10/22/2001   normal coronary arteriea (Dr. Domenic Moras)  . COLONOSCOPY  08/31/2011   XHB:ZJIRCVEL rectal and colon polyps-treated. Single tubular adenoma. Next TCS 08/2016  . COLONOSCOPY N/A 07/26/2016   Dr. [name]: diverticulosis in sigmoid and descending colon. 6 mm benign splenic flexure. surveillance 5 yeras  . ESOPHAGOGASTRODUODENOSCOPY N/A 07/26/2016   Dr. [name]: normal esophagus, portal gastropathy, chronic gastritis, normal duodenum, screening EGD 2 years   . ESOPHAGOGASTRODUODENOSCOPY (EGD) WITH ESOPHAGEAL DILATION N/A 05/15/2013   FYB:OFBPZW esophagus-status post Venia Minks dilation/Portal gastropathy. Antral erosions-status post biopsy. extrinsic compression along lesser curvature likely secondary to splenomegaly. gastric bx benign  . HERNIA REPAIR  03/2009  . POLYPECTOMY  07/26/2016   Procedure: POLYPECTOMY;  Surgeon: [name], MD;  Location: [facility];  Service: Endoscopy;;  colon  . TRANSTHORACIC ECHOCARDIOGRAM  2011   EF 25-85%, stage 1 diastolic dysfunction, trace TR & pulm valve regurg   . VASECTOMY  1989     reports that he quit smoking about 9 years ago. His smoking use included cigarettes. He has a 45.00 pack-year smoking history. He has quit using smokeless tobacco. He reports that he does not drink alcohol and does not use drugs.  Allergies  Allergen Reactions  . Baclofen     lethargic   .  Simvastatin Nausea Only    Family History  Problem Relation Age of Onset  . Heart disease Mother   . Cancer Mother        lymph nodes  . Diabetes Mother   . Arthritis Other   . Lung disease Other   . Asthma Other   . Kidney disease Other   . Ovarian cancer Sister   . Diabetes Brother   . Heart disease Brother   . Hypertension Brother   . Diabetes Sister   . Diabetes Sister   . Cirrhosis Sister 68       no etoh  . Colon cancer Neg Hx     Prior to  Admission medications   Medication Sig Start Date End Date Taking? Authorizing Provider  acetaminophen (TYLENOL) 500 MG tablet Take 1,000 mg by mouth daily as needed for headache.    Yes [provider]  albuterol (PROVENTIL) (2.5 MG/3ML) 0.083% nebulizer solution Take 3 mLs (2.5 mg total) by nebulization every 2 (two) hours as needed for wheezing. 11/02/17  Yes Sinda Du, MD  ALPRAZolam [name]) 0.25 MG tablet Take 0.25 mg by mouth 2 (two) times daily.    Yes [provider]  Ascorbic Acid (VITAMIN C) 100 MG tablet Take 100 mg by mouth daily.   Yes [provider]  aspirin EC 81 MG tablet Take 81 mg by mouth at bedtime.    Yes [provider]  atorvastatin (LIPITOR) 40 MG tablet Take 1 tablet (40 mg total) by mouth daily. Please make annual appt with Dr. Debara Pickett for refills. [phone]. 3rd& final attempt. 08/10/19  Yes Hilty, [name], MD  budesonide-formoterol (SYMBICORT) 160-4.5 MCG/ACT inhaler Inhale 2 puffs into the lungs 2 (two) times daily.   Yes [provider]  diltiazem (CARDIZEM CD) 240 MG 24 hr capsule TAKE 1 CAPSULE BY MOUTH EVERY DAY 07/08/18  Yes Hilty, [name], MD  gabapentin (NEURONTIN) 300 MG capsule Take 1 capsule by mouth at bedtime. 07/18/17  Yes [provider]  glimepiride (AMARYL) 1 MG tablet Take 1 mg by mouth daily. 04/26/20  Yes [provider]  JARDIANCE 25 MG TABS tablet Take 25 mg by mouth every evening.  01/01/17  Yes [provider]  Multiple Vitamin (MULITIVITAMIN WITH MINERALS) TABS Take 1 tablet by mouth at bedtime.    Yes [provider]  OXYGEN Inhale 4 L into the lungs. Pt uses oxygen at night and when exerting himself    Yes [provider]  pantoprazole (PROTONIX) 40 MG tablet Take 1 tablet (40 mg total) by mouth daily before breakfast. 07/28/19  Yes [name], PA-C  PROAIR HFA 108 (90 Base) MCG/ACT inhaler Inhale 2 puffs into the lungs every 4 (four) hours as  needed for wheezing or shortness of breath.  08/29/15  Yes [provider]  sertraline (ZOLOFT) 50 MG tablet Take 50 mg by mouth daily.   Yes [provider]  SitaGLIPtin-MetFORMIN HCl (JANUMET XR) [phone] MG TB24 Take 1 tablet by mouth daily.   Yes [provider]  tamsulosin (FLOMAX) 0.4 MG CAPS capsule Take 0.4 mg by mouth daily.   Yes [provider]  Tiotropium Bromide Monohydrate (SPIRIVA RESPIMAT) 2.5 MCG/ACT AERS Inhale 2 puffs into the lungs daily. 02/03/20  Yes Tanda Rockers, MD  VITAMIN D PO Take 1 tablet by mouth daily.   Yes [provider]  HYDROcodone-acetaminophen (NORCO/VICODIN) 5-325 MG per tablet Take 1 tablet by mouth every 8 (eight) hours as needed  for moderate pain. Patient not taking: Reported on 05/23/2020 11/09/14   [name], MD  HYDROcodone-acetaminophen (NORCO/VICODIN) 5-325 MG tablet Take 1 tablet by mouth every 6 (six) hours as needed. Patient not taking: Reported on 05/23/2020 04/17/20   Fredia Sorrow, MD  [facility] LANCETS 86P [place]  08/26/18   [provider]  [facility] VERIO test strip  08/26/18   [provider]    Physical Exam: Vitals:   05/23/20 1538 05/23/20 1540 05/23/20 1700  BP: 119/71  126/81  Pulse: (!) 120  (!) 103  Resp: 16  18  Temp: 98.7 F (37.1 C)    TempSrc: Oral    SpO2: 90%  99%  Weight:  75.2 kg   Height:  5' 7"  (1.702 m)     Constitutional: NAD, calm, comfortable Vitals:   05/23/20 1538 05/23/20 1540 05/23/20 1700  BP: 119/71  126/81  Pulse: (!) 120  (!) 103  Resp: 16  18  Temp: 98.7 F (37.1 C)    TempSrc: Oral    SpO2: 90%  99%  Weight:  75.2 kg   Height:  5' 7"  (1.702 m)    Eyes: PERRL, lids and conjunctivae normal ENMT: Mucous membranes are moist. Neck: normal, supple, no masses, no thyromegaly Respiratory: clear to auscultation bilaterally, no wheezing, no crackles. Normal respiratory effort. No accessory muscle use.  Cardiovascular:  Mild tachycardia, regular rate and rhythm, no murmurs / rubs / gallops. No extremity edema. 2+ pedal pulses.   Abdomen: Protuberant abdomen, but patient states this is normal for him no tenderness, no masses palpated. No hepatosplenomegaly. Bowel sounds positive.  Musculoskeletal: no clubbing / cyanosis. No joint deformity upper and lower extremities. Good ROM, no contractures. Normal muscle tone.  Skin: no rashes, lesions, ulcers. No induration Neurologic: No apparent cranial nerve abnormality, moving extremities spontaneously. Psychiatric: Normal judgment and insight. Alert and oriented x 3. Normal mood.   Labs on Admission: I have personally reviewed following labs and imaging studies  CBC: Recent Labs  Lab 05/23/20 1615  WBC 10.4  NEUTROABS 8.4*  HGB 14.9  HCT 48.9  MCV 81.2  PLT 86*   Basic Metabolic Panel: Recent Labs  Lab 05/23/20 1615  NA 135  K 4.0  CL 99  CO2 25  GLUCOSE 184*  BUN 22  CREATININE 1.06  CALCIUM 9.2   Liver Function Tests: Recent Labs  Lab 05/23/20 1615  AST 18  ALT 27  ALKPHOS 82  BILITOT 1.4*  PROT 8.5*  ALBUMIN 4.0    Radiological Exams on Admission: DG Chest Portable 1 View  Result Date: 05/23/2020 CLINICAL DATA:  Cough EXAM: PORTABLE CHEST 1 VIEW COMPARISON:  April 17, 2020 FINDINGS: The heart size and mediastinal contours are unchanged with mild cardiomegaly. There is prominence of the central pulmonary vasculature. Mildly increased hazy airspace opacity seen at the periphery of the right lung base and streaky airspace atelectasis in the right mid lung. There is calcified granulomata seen within the right lung apex. No acute osseous abnormality. IMPRESSION: Hazy airspace opacity at the periphery of the right lung base which could be due to atelectasis and/or infectious etiology. Pulmonary vascular congestion. Electronically Signed   By: Prudencio Pair M.D.   On: 05/23/2020 16:17    EKG: Independently reviewed.  Sinus tachycardia rate  120, QTc 429.  No significant ST-T wave changes compared to prior.  Sinus tachycardia rate 120.  QTc 429.  No significant ST-T wave changes compared to prior  Assessment/Plan Principal Problem:  Acute on chronic respiratory failure with hypoxia ([name]) Active Problems:   COPD with acute exacerbation ([name])   PNA (pneumonia)   Type 2 diabetes mellitus without complication, without long-term current use of insulin ([name])   HTN (hypertension)   Liver fibrosis   COPD GOLD III   Pneumonia-dyspnea, cough, tachycardic to 120, improved.  Not meeting criteria for sepsis.  With hypoxia chest x-ray showing right basal opacity atelectasis versus infection.  WBC 10.4.[name]  Covid test negative.  Denies dysphagia. -Continue IV ceftriaxone and azithromycin -BMP CBC in the morning -Speech therapy evaluation  Acute COPD Gold stage III exacerbation-initial rhonchi on presentation has resolved at the time of my evaluation.  Exacerbation likely provoked by pneumonia. -Mucolytic's -Resume home steroid inhalers as budesonide -DuoNebs scheduled and as needed -Solu-Medrol 60 every 12 hourly x1 day, then continue with prednisone 40 mg daily  Acute on chronic respiratory failure-O2 sats in the 80s on home 2 L, currently O2 sats 90% on 4 L.  Likely due to combination of COPD exacerbation and pneumonia. -Supplemental O2, flutter valve  Diabetes mellitus-random glucose 184. -Obtain hemoglobin A1c -Resume glmepiride, Jardiance -Hold Metformin-sitagliptin - SSI- M, while on steroids  HTN-stable. -Resume diltiazem, tamsulosin  NASH Liver cirrhosis  Depression -Resume Xanax 0.25 mg twice daily, Zoloft  DVT prophylaxis:  Lovenox Code Status: Full code Family Communication: Spouse at bedside. Disposition Plan:  ~ 2 days, pending treatment of pneumonia and improvement in respiratory status. Consults called: None Admission status: Inpt, tele  I certify that at the point of admission it is my clinical judgment  that the patient will require inpatient hospital care spanning beyond 2 midnights from the point of admission due to high intensity of service, high risk for further deterioration and high frequency of surveillance required. The following factors support the patient status of inpatient: Acute respiratory failure, requiring IV antibiotics for COPD exacerbation and pneumonia.   Bethena Roys MD [facility]  05/23/2020, 7:40 PM

## 2020-05-23 NOTE — Assessment & Plan Note (Signed)
Started on 02 around 2019 As of 02/03/2020  2lpm 24/7  -  02/03/2020 referred to adapt for Best fit for amb 02 > 3lpm pulsed tank -  04/28/2020   Walked 3lpm pulsed tank  approx   400 ft  @ moderate pace  stopped due to  Sob/tired  with sats still 95%     Acute on chronic 02 dep resp failure > better on 4lpm but still sob at rest > to ER          Each maintenance medication was reviewed in detail including emphasizing most importantly the difference between maintenance and prns and under what circumstances the prns are to be triggered using an action plan format where appropriate.  Total time for H and P, chart review, counseling, teaching device and generating customized AVS unique to this acute office visit / charting = 35 min

## 2020-05-23 NOTE — ED Provider Notes (Signed)
[facility] EMERGENCY DEPARTMENT Provider Note   CSN: [id number] Arrival date & time: 05/23/20  1530     History Chief Complaint  Patient presents with  . Shortness of Breath    [name] is a 64 y.o. male.  HPI Patient with multiple medical issues including COPD, home oxygen dependency presents from his pulmonologist office due to concern for hypoxia, cough. Patient's treatment for pneumonia 1 month ago.  He was recovering generally well until 3 days ago, when he noticed chills, cough.  Since that time he has had discomfort, subjective fever and chills, and required additional oxygen to get beyond his 2 L.  Currently, in spite of using his home medications, oxygen as above, he has had progression of disease, prompting evaluation with his pulmonologist today. I discussed the patient with his pulmonologist after my initial evaluation, we reviewed his x-rays from today and last month together, including notation of possible no opacification, right lung base.  Past Medical History:  Diagnosis Date  . Anxiety   . Asthma   . Chest pain    a. 2003 Cath: nl cors.  [name] COPD (chronic obstructive pulmonary disease) ([place])   . Depression   . Diabetes mellitus   . Dyslipidemia   . Dysrhythmia   . Fracture 12/15   lower back "L-1"  . GERD (gastroesophageal reflux disease)   . Hypertension   . PONV (postoperative nausea and vomiting)   . Splenomegaly     Patient Active Problem List   Diagnosis Date Noted  . Chronic respiratory failure with hypoxia ([place]) 02/04/2020  . Upper airway cough syndrome 12/10/2019  . Hives 07/30/2018  . Full body hives 07/29/2018  . Idiopathic angioedema 07/29/2018  . COPD GOLD III 07/29/2018  . Mycobacterium avium infection ([place]) 12/18/2017  . Goiter 10/25/2017  . Suspected Mycobacterial infectious disease 10/25/2017  . COPD with acute exacerbation ([place]) 10/19/2017  . Dehydration 10/19/2017  . NASH (nonalcoholic steatohepatitis) 08/05/2017  .  Liver fibrosis 01/31/2017  . Hx of adenomatous colonic polyps 07/05/2016  . Hyperproteinemia 09/05/2014  . HTN (hypertension) 10/14/2013  . Dyslipidemia 10/14/2013  . Type 2 diabetes mellitus without complication, without long-term current use of insulin ([place]) 10/14/2013  . Leukopenia 08/08/2013  . Thrombocytopenia ([place]) 08/08/2013  . Lower abdominal pain 07/06/2013  . Abnormal LFTs 07/06/2013  . GERD (gastroesophageal reflux disease) 07/06/2013  . Unspecified constipation 07/06/2013  . Abdominal bloating 04/21/2013  . Abdominal pain, generalized 04/21/2013  . Non-alcoholic fatty liver disease 04/21/2013  . Splenomegaly 04/21/2013  . Lumbago 11/26/2011  . Muscle weakness (generalized) 11/26/2011  . Bilateral leg pain 11/06/2011  . Back pain with radiation 11/06/2011  . DDD (degenerative disc disease), lumbar 11/06/2011    Past Surgical History:  Procedure Laterality Date  . BIOPSY  07/26/2016   Procedure: BIOPSY;  Surgeon: [name], MD;  Location: [facility] ENDO SUITE;  Service: Endoscopy;;  gastric  . CARDIAC CATHETERIZATION  10/22/2001   normal coronary arteriea (Dr. Domenic Moras)  . COLONOSCOPY  08/31/2011   OHY:[id number] rectal and colon polyps-treated. Single tubular adenoma. Next TCS 08/2016  . COLONOSCOPY N/A 07/26/2016   Dr. [name]: diverticulosis in sigmoid and descending colon. 6 mm benign splenic flexure. surveillance 5 yeras  . ESOPHAGOGASTRODUODENOSCOPY N/A 07/26/2016   Dr. [name]: normal esophagus, portal gastropathy, chronic gastritis, normal duodenum, screening EGD 2 years   . ESOPHAGOGASTRODUODENOSCOPY (EGD) WITH ESOPHAGEAL DILATION N/A 05/15/2013   IRS:WNIOEV esophagus-status post Venia Minks dilation/Portal gastropathy. Antral erosions-status post biopsy. extrinsic compression along lesser  curvature likely secondary to splenomegaly. gastric bx benign  . HERNIA REPAIR  03/2009  . POLYPECTOMY  07/26/2016   Procedure: POLYPECTOMY;  Surgeon: [name], MD;  Location: [facility] ENDO  SUITE;  Service: Endoscopy;;  colon  . TRANSTHORACIC ECHOCARDIOGRAM  2011   EF 46-65%, stage 1 diastolic dysfunction, trace TR & pulm valve regurg   . VASECTOMY  1989       Family History  Problem Relation Age of Onset  . Heart disease Mother   . Cancer Mother        lymph nodes  . Diabetes Mother   . Arthritis Other   . Lung disease Other   . Asthma Other   . Kidney disease Other   . Ovarian cancer Sister   . Diabetes Brother   . Heart disease Brother   . Hypertension Brother   . Diabetes Sister   . Diabetes Sister   . Cirrhosis Sister 16       no etoh  . Colon cancer Neg Hx     Social History   Tobacco Use  . Smoking status: Former Smoker    Packs/day: 1.25    Years: 36.00    Pack years: 45.00    Types: Cigarettes    Quit date: 04/2011    Years since quitting: 9.1  . Smokeless tobacco: Former Network engineer  . Vaping Use: Never used  Substance Use Topics  . Alcohol use: No  . Drug use: No    Home Medications Prior to Admission medications   Medication Sig Start Date End Date Taking? Authorizing Provider  acetaminophen (TYLENOL) 500 MG tablet Take 1,000 mg by mouth daily as needed for headache.    Yes [provider]  albuterol (PROVENTIL) (2.5 MG/3ML) 0.083% nebulizer solution Take 3 mLs (2.5 mg total) by nebulization every 2 (two) hours as needed for wheezing. 11/02/17  Yes Sinda Du, MD  ALPRAZolam [name]) 0.25 MG tablet Take 0.25 mg by mouth 2 (two) times daily.    Yes [provider]  Ascorbic Acid (VITAMIN C) 100 MG tablet Take 100 mg by mouth daily.   Yes [provider]  aspirin EC 81 MG tablet Take 81 mg by mouth at bedtime.    Yes [provider]  atorvastatin (LIPITOR) 40 MG tablet Take 1 tablet (40 mg total) by mouth daily. Please make annual appt with Dr. Debara Pickett for refills. [phone]. 3rd& final attempt. 08/10/19  Yes Hilty, [name], MD  budesonide-formoterol (SYMBICORT) 160-4.5 MCG/ACT inhaler Inhale  2 puffs into the lungs 2 (two) times daily.   Yes [provider]  diltiazem (CARDIZEM CD) 240 MG 24 hr capsule TAKE 1 CAPSULE BY MOUTH EVERY DAY 07/08/18  Yes Hilty, [name], MD  gabapentin (NEURONTIN) 300 MG capsule Take 1 capsule by mouth at bedtime. 07/18/17  Yes [provider]  glimepiride (AMARYL) 1 MG tablet Take 1 mg by mouth daily. 04/26/20  Yes [provider]  JARDIANCE 25 MG TABS tablet Take 25 mg by mouth every evening.  01/01/17  Yes [provider]  Multiple Vitamin (MULITIVITAMIN WITH MINERALS) TABS Take 1 tablet by mouth at bedtime.    Yes [provider]  OXYGEN Inhale 4 L into the lungs. Pt uses oxygen at night and when exerting himself    Yes [provider]  pantoprazole (PROTONIX) 40 MG tablet Take 1 tablet (40 mg total) by mouth daily before breakfast. 07/28/19  Yes [name], PA-C  PROAIR  HFA 108 (90 Base) MCG/ACT inhaler Inhale 2 puffs into the lungs every 4 (four) hours as needed for wheezing or shortness of breath.  08/29/15  Yes [provider]  sertraline (ZOLOFT) 50 MG tablet Take 50 mg by mouth daily.   Yes [provider]  SitaGLIPtin-MetFORMIN HCl (JANUMET XR) [id number] MG TB24 Take 1 tablet by mouth daily.   Yes [provider]  tamsulosin (FLOMAX) 0.4 MG CAPS capsule Take 0.4 mg by mouth daily.   Yes [provider]  Tiotropium Bromide Monohydrate (SPIRIVA RESPIMAT) 2.5 MCG/ACT AERS Inhale 2 puffs into the lungs daily. 02/03/20  Yes Tanda Rockers, MD  VITAMIN D PO Take 1 tablet by mouth daily.   Yes [provider]  HYDROcodone-acetaminophen (NORCO/VICODIN) 5-325 MG per tablet Take 1 tablet by mouth every 8 (eight) hours as needed for moderate pain. Patient not taking: Reported on 05/23/2020 11/09/14   [name], MD  HYDROcodone-acetaminophen (NORCO/VICODIN) 5-325 MG tablet Take 1 tablet by mouth every 6 (six) hours as needed. Patient not taking: Reported  on 05/23/2020 04/17/20   Fredia Sorrow, MD  [facility] LANCETS 25K MISC  08/26/18   [provider]  [facility] VERIO test strip  08/26/18   [provider]    Allergies    Baclofen and Simvastatin  Review of Systems   Review of Systems  Constitutional:       Per HPI, otherwise negative  HENT:       Per HPI, otherwise negative  Respiratory:       Per HPI, otherwise negative  Cardiovascular:       Per HPI, otherwise negative  Gastrointestinal: Negative for vomiting.  Endocrine:       Negative aside from HPI  Genitourinary:       Neg aside from HPI   Musculoskeletal:       Per HPI, otherwise negative  Skin: Negative.   Neurological: Negative for syncope.    Physical Exam Updated Vital Signs BP 119/71 (BP Location: Right Arm)   Pulse (!) 120   Temp 98.7 F (37.1 C) (Oral)   Resp 16   Ht 5' 7"  (1.702 m)   Wt 75.2 kg   SpO2 90%   BMI 25.97 kg/m   Physical Exam Vitals and nursing note reviewed.  Constitutional:      General: He is not in acute distress.    Appearance: He is well-developed.  HENT:     Head: Normocephalic and atraumatic.  Eyes:     Conjunctiva/sclera: Conjunctivae normal.  Cardiovascular:     Rate and Rhythm: Regular rhythm. Tachycardia present.  Pulmonary:     Effort: Pulmonary effort is normal.     Breath sounds: Examination of the right-lower field reveals rhonchi. Examination of the left-lower field reveals rhonchi. Decreased breath sounds and rhonchi present.  Abdominal:     General: There is no distension.  Skin:    General: Skin is warm and dry.  Neurological:     Mental Status: He is alert and oriented to person, place, and time.      ED Results / Procedures / Treatments   Labs (all labs ordered are listed, but only abnormal results are displayed) Labs Reviewed  CBC WITH DIFFERENTIAL/PLATELET - Abnormal; Notable for the following components:      Result Value   RBC 6.02 (*)    MCH 24.8 (*)    Platelets 86  (*)    Neutro Abs 8.4 (*)    All other  components within normal limits  BRAIN NATRIURETIC PEPTIDE - Abnormal; Notable for the following components:   B Natriuretic Peptide 581.0 (*)    All other components within normal limits  RESPIRATORY PANEL BY RT PCR (FLU A&B, COVID)  COMPREHENSIVE METABOLIC PANEL  URINALYSIS, ROUTINE W REFLEX MICROSCOPIC    EKG EKG Interpretation  Date/Time:  Monday May 23 2020 15:37:32 EST Ventricular Rate:  120 PR Interval:  124 QRS Duration: 88 QT Interval:  304 QTC Calculation: 429 R Axis:   84 Text Interpretation: Sinus tachycardia ST-t wave abnormality Artifact Abnormal ECG Confirmed by Carmin Muskrat [phone]) on 05/23/2020 4:08:20 PM   Radiology DG Chest Portable 1 View  Result Date: 05/23/2020 CLINICAL DATA:  Cough EXAM: PORTABLE CHEST 1 VIEW COMPARISON:  April 17, 2020 FINDINGS: The heart size and mediastinal contours are unchanged with mild cardiomegaly. There is prominence of the central pulmonary vasculature. Mildly increased hazy airspace opacity seen at the periphery of the right lung base and streaky airspace atelectasis in the right mid lung. There is calcified granulomata seen within the right lung apex. No acute osseous abnormality. IMPRESSION: Hazy airspace opacity at the periphery of the right lung base which could be due to atelectasis and/or infectious etiology. Pulmonary vascular congestion. Electronically Signed   By: Prudencio Pair M.D.   On: 05/23/2020 16:17    Procedures Procedures (including critical care time)  Medications Ordered in ED Medications  azithromycin (ZITHROMAX) 500 mg in sodium chloride 0.9 % 250 mL IVPB (has no administration in time range)  cefTRIAXone (ROCEPHIN) 1 g in sodium chloride 0.9 % 100 mL IVPB (has no administration in time range)    ED Course  I have reviewed the triage vital signs and the nursing notes.  Pertinent labs & imaging results that were available during my care of the patient were  reviewed by me and considered in my medical decision making (see chart for details).   Early differential including heart failure exacerbation, COPD exacerbation, infectious etiology considered.  X-ray, labs ordered. With 4 L via nasal cannula patient is 97% saturation, abnormal, increased oxygen requirement. Cardiac monitor 120, sinus tach, abnormal  MDM Rules/Calculators/A&P Adult male with COPD, home oxygen dependency presents with worsening dyspnea, chills, fever.  Patient has no leukocytosis, but is found to have new right-sided opacification concerning for pneumonia.  Covid negative.  BNP elevated w/o Hx of CHF. With new oxygen requirement, patient started on IV antibiotics, required admission for further monitoring, management. Final Clinical Impression(s) / ED Diagnoses Final diagnoses:  Community acquired pneumonia of right lower lobe of lung   MDM Number of Diagnoses or Management Options Community acquired pneumonia of right lower lobe of lung: new, needed workup   Amount and/or Complexity of Data Reviewed Clinical lab tests: reviewed Tests in the radiology section of CPT: reviewed Tests in the medicine section of CPT: reviewed Decide to obtain previous medical records or to obtain history from someone other than the patient: yes Obtain history from someone other than the patient: yes Review and summarize past medical records: yes Discuss the patient with other providers: yes Independent visualization of images, tracings, or specimens: yes  Risk of Complications, Morbidity, and/or Mortality Presenting problems: high Diagnostic procedures: high Management options: high  Critical Care Total time providing critical care: < 30 minutes  Patient Progress Patient progress: stable    Carmin Muskrat, MD 05/23/20 1706

## 2020-05-23 NOTE — ED Notes (Signed)
PT is aware we need urine sample, urinal on bedside.

## 2020-05-23 NOTE — Progress Notes (Signed)
[name], male    DOB: [birth year]     MRN: [id number]   Brief patient profile:  6 yowm MM/quit smoking 2012 with COPD GOLD III criteria 06/17/20 previously followed by Dr [name] referred back to pulmonary clinic 12/09/2019 in [place].   History of Present Illness  12/09/2019  Pulmonary/ 1st office eval/[name]  symbicort 160/spiriva  Chief Complaint  Patient presents with  . Pulmonary Consult    Former patient of Dr [name].  Breathing has been worse over the past few wks. Recently treated with Doxy per PCP for COPD exacerbation. He is occ coughing up some green sputum.   flare 11/23/19  rx doxy 5/19 x 7 days  Dyspnea:  Baseline uses HC parking / MMRC3 = can't walk 100 yards even at a slow pace at a flat grade s stopping due to sob  On 2lpm  Cough: improved less purulent purulent  Sleep: wakes up coughing since onset /sometimes uses saba proair  SABA use: rarely daytime rec Continue protonix 40 mg Take 30-60 min before first meal of the day  GERD diet  Plan A = Automatic = Always=    Budesonide/formoterol (symbicort ) 2 puffs first thing in am and 12 hours later and use spiriva x 2 pffs after the morning symbicort (ok to change back to the powder after 2 weeks but will need to stop it if you start coughing  Work on inhaler technique  Plan B = Backup (to supplement plan A, not to replace it) Only use your albuterol inhaler as a rescue medication    Plan C = Crisis (instead of Plan B   Make sure you check your oxygen saturations at highest level of activity to be sure it stays over 90%   Prednisone 10 mg take  4 each am x 2 days,   2 each am x 2 days,  1 each am x 2 days and stop  For cough > mucinex dm 1200 mg every 12 hours as needed  Would wait another 2 weeks before you get your first shot for covid 19    02/03/2020  f/u ov/Paraskevi Funez re: GOLD III/ 02 dep/ rx symb /spriva powder  Chief Complaint  Patient presents with  . Acute Visit    shortness of breath with exertion, productive  cough with green phlegm  Dyspnea: abruptly worse x 48 h = MMRC3 = can't walk 100 yards even at a slow pace at a flat grade s stopping due to sob   Cough: worse with green mucus/ worse since changed back to dpi spiriva Sleeping: 2 am tends to cramp SABA use: none on day of ov  02: 2lpm  Up to 2.5 lpm  rec zpak Prednisone 10 mg take  4 each am x 2 days,   2 each am x 2 days,  1 each am x 2 days and stop  Change spiriva to respimat 2 puff each am  For cough mucinex dm up to 1200 mg every 12 hours and supplement hydocodone every 4 hours as needed  Get your covid vaccination asap We will call adapt for a best fit analysis for portable 0xygen  In meantime, Make sure you check your oxygen saturations at highest level of activity to be sure it stays over 90% and adjust upward to maintain this level if needed but remember to turn it back to previous settings when you stop (to conserve your supply).    04/28/2020  f/u ov/[place] office/[name] re: GOLD III/ 02  dep maint rx spiriva/symbicort 160 Chief Complaint  Patient presents with  . Follow-up    Pt states dx with PNA on 04/17/20- treated with zpack.  He states his breathing is slowly improving since then.  He is using his albuterol inhaler 2 x per wk and he has not used neb.  Dyspnea:  [employer] x slow on 02 3lpm x 3 ailes , not checking sats   Cough: better, no more green mucus  Sleeping: 2 pillows bed is flat  SABA use: hfa couple times a week, not neb 02: 3lpm 24/7  rec Work on inhaler technique:  relax and gently blow all the way out then take a nice smooth deep breath back in, triggering the inhaler at same time you start breathing in.   Make sure you check your oxygen saturations at highest level of activity to be sure it stays over 90% and adjust  02 flow upward to maintain this level if needed but remember to turn it back to previous settings when you stop (to conserve your supply).  If your medications are not affordable on your new  plan bring in your drug formulary and I will pick the best option.     05/23/2020  Acutely ill ov/[name] office/[name] re: new shaking chill 11/13 / and nasty color / new R cp  Chief Complaint  Patient presents with  . Follow-up    productive cough with green-brownish-red phlegm since saturday, shortness of breath with activity  Dyspnea: at rest  Cough: rattling/ honking cough  Sleeping: on side 2-3 pillows  SABA use: has not tried saba  02: 3lpm Pulsed  Does not have neb equipment, not sure he can get pantoprazole or symbicort  Thru his insurance company   No obvious day to day or daytime variability or assoc excess/ purulent sputum or mucus plugs or hemoptysis or cp or chest tightness, subjective wheeze or overt sinus or hb symptoms.    . Also denies any obvious fluctuation of symptoms with weather or environmental changes or other aggravating or alleviating factors except as outlined above   No unusual exposure hx or h/o childhood pna/ asthma or knowledge of premature birth.  Current Allergies, Complete Past Medical History, Past Surgical History, Family History, and Social History were reviewed in [employer] record.  ROS  The following are not active complaints unless bolded Hoarseness, sore throat, dysphagia, dental problems, itching, sneezing,  nasal congestion or discharge of excess mucus or purulent secretions, ear ache,   fever, chills, sweats, unintended wt loss or wt gain, classically pleuritic or exertional cp,  orthopnea pnd or arm/hand swelling  or leg swelling, presyncope, palpitations, abdominal pain, anorexia, nausea, vomiting, diarrhea  or change in bowel habits or change in bladder habits, change in stools or change in urine, dysuria, hematuria,  rash, arthralgias, visual complaints, headache, numbness, weakness or ataxia or problems with walking or coordination,  change in mood or  memory.        Current Meds - - NOTE:   Unable to verify as  accurately reflecting what pt takes     Medication Sig  . acetaminophen (TYLENOL) 500 MG tablet Take 1,000 mg by mouth daily as needed for headache.   . albuterol (PROVENTIL) (2.5 MG/3ML) 0.083% nebulizer solution Take 3 mLs (2.5 mg total) by nebulization every 2 (two) hours as needed for wheezing.  [name] ALPRAZolam (XANAX) 0.25 MG tablet Take 0.25 mg by mouth 2 (two) times daily.   [name] aspirin EC  81 MG tablet Take 81 mg by mouth at bedtime.   [name] atorvastatin (LIPITOR) 40 MG tablet Take 1 tablet (40 mg total) by mouth daily. Please make annual appt with Dr. Debara Pickett for refills. [phone]. 3rd& final attempt.  . budesonide-formoterol (SYMBICORT) 160-4.5 MCG/ACT inhaler Inhale 2 puffs into the lungs 2 (two) times daily.  [name] diltiazem (CARDIZEM CD) 240 MG 24 hr capsule TAKE 1 CAPSULE BY MOUTH EVERY DAY  . gabapentin (NEURONTIN) 300 MG capsule Take 1 capsule by mouth at bedtime.  [name] glimepiride (AMARYL) 1 MG tablet Take 1 mg by mouth daily.  [name] HYDROcodone-acetaminophen (NORCO/VICODIN) 5-325 MG per tablet Take 1 tablet by mouth every 8 (eight) hours as needed for moderate pain.  [name] HYDROcodone-acetaminophen (NORCO/VICODIN) 5-325 MG tablet Take 1 tablet by mouth every 6 (six) hours as needed.  [name] JARDIANCE 25 MG TABS tablet Take 25 mg by mouth every evening.   . Multiple Vitamin (MULITIVITAMIN WITH MINERALS) TABS Take 1 tablet by mouth at bedtime.   Gl[facility] DELICA LANCETS 85O MISC   . ONETOUCH VERIO test strip   . OXYGEN Inhale into the lungs. Pt uses oxygen at night and when exerting himself  . pantoprazole (PROTONIX) 40 MG tablet Take 1 tablet (40 mg total) by mouth daily before breakfast.  . PROAIR HFA 108 (90 Base) MCG/ACT inhaler Inhale 2 puffs into the lungs every 4 (four) hours as needed for wheezing or shortness of breath.   . sertraline (ZOLOFT) 50 MG tablet Take 50 mg by mouth daily.  . SitaGLIPtin-MetFORMIN HCl (JANUMET XR) [phone] MG TB24 Take 1 tablet by mouth daily.  . SitaGLIPtin-MetFORMIN  HCl [phone] MG TB24 Take by mouth.  . tamsulosin (FLOMAX) 0.4 MG CAPS capsule Take 0.4 mg by mouth daily.  . Tiotropium Bromide Monohydrate (SPIRIVA RESPIMAT) 2.5 MCG/ACT AERS Inhale 2 puffs into the lungs daily.  [name] VITAMIN D PO Take 1 tablet by mouth daily.                               Past Medical History:  Diagnosis Date  . Anxiety   . Asthma   . Chest pain    a. 2003 Cath: nl cors.  [name] COPD (chronic obstructive pulmonary disease) ([place])   . Depression   . Diabetes mellitus   . Dyslipidemia   . Dysrhythmia   . Fracture 12/15   lower back "L-1"  . GERD (gastroesophageal reflux disease)   . Hypertension   . PONV (postoperative nausea and vomiting)   . Splenomegaly        Objective:     05/23/2020      166 04/28/2020      166   12/30/19 162 lb (73.5 kg)  12/21/19 162 lb (73.5 kg)  12/09/19 162 lb (73.5 kg)     Vital signs reviewed  05/23/2020  - Note at rest 02 sats  89% on 3 lpm pulsed       slt hoarse amb wm nad at rest    HEENT : pt wearing mask not removed for exam due to covid -19 concerns.    NECK :  without JVD/Nodes/TM/ nl carotid upstrokes bilaterally   LUNGS: no acc muscle use,  Mod barrel  contour chest wall with bilateral  Distant bs with a few insp crackles, no true exp wheeze and  without cough on insp or exp maneuvers and mod  Hyperresonant  to  percussion bilaterally  CV:  RRR  no s3 or murmur or increase in P2, and no edema   ABD:  Mod distended but  soft and nontender with pos mid insp Hoover's  in the supine position. No bruits or organomegaly appreciated, bowel sounds nl  MS:     ext warm without deformities, calf tenderness, cyanosis or clubbing No obvious joint restrictions   SKIN: warm and dry without lesions    NEURO:  alert, approp, nl sensorium with  no motor or cerebellar deficits apparent.                 Assessment

## 2020-05-23 NOTE — Assessment & Plan Note (Signed)
Quit smoking 2012 - alpha one AT screen  01/31/17  MM  Level 169 - PFT's  06/18/2019  FEV1 1.12 (35 % ) ratio 0.46  p 0 % improvement from saba p ? prior to study with DLCO  11.91 (48%) corrects to 3.18 (754%)  for alv volume and FV curve classic concavity   - 12/09/2019  After extensive coaching inhaler device,  effectiveness =    90% with smi so try spiriva respimat 2 bid instead of DPI  - 04/28/2020  After extensive coaching inhaler device,  effectiveness =    75% (short ti, good trigger)   Now ? Access to maint meds/ no neb equipment in midst of flare of sob / cough with possible pna > to er for eval

## 2020-05-24 DIAGNOSIS — J441 Chronic obstructive pulmonary disease with (acute) exacerbation: Secondary | ICD-10-CM | POA: Diagnosis not present

## 2020-05-24 DIAGNOSIS — E119 Type 2 diabetes mellitus without complications: Secondary | ICD-10-CM

## 2020-05-24 DIAGNOSIS — J189 Pneumonia, unspecified organism: Secondary | ICD-10-CM | POA: Diagnosis not present

## 2020-05-24 DIAGNOSIS — J9621 Acute and chronic respiratory failure with hypoxia: Secondary | ICD-10-CM | POA: Diagnosis not present

## 2020-05-24 DIAGNOSIS — I10 Essential (primary) hypertension: Secondary | ICD-10-CM

## 2020-05-24 DIAGNOSIS — J449 Chronic obstructive pulmonary disease, unspecified: Secondary | ICD-10-CM | POA: Diagnosis not present

## 2020-05-24 LAB — GLUCOSE, CAPILLARY: Glucose-Capillary: 266 mg/dL — ABNORMAL HIGH (ref 70–99)

## 2020-05-24 LAB — CBC
HCT: 49.1 % (ref 39.0–52.0)
Hemoglobin: 15 g/dL (ref 13.0–17.0)
MCH: 24.9 pg — ABNORMAL LOW (ref 26.0–34.0)
MCHC: 30.5 g/dL (ref 30.0–36.0)
MCV: 81.6 fL (ref 80.0–100.0)
Platelets: 88 10*3/uL — ABNORMAL LOW (ref 150–400)
RBC: 6.02 MIL/uL — ABNORMAL HIGH (ref 4.22–5.81)
RDW: 14.8 % (ref 11.5–15.5)
WBC: 5.8 10*3/uL (ref 4.0–10.5)
nRBC: 0 % (ref 0.0–0.2)

## 2020-05-24 LAB — BASIC METABOLIC PANEL
Anion gap: 10 (ref 5–15)
BUN: 22 mg/dL (ref 8–23)
CO2: 26 mmol/L (ref 22–32)
Calcium: 9.3 mg/dL (ref 8.9–10.3)
Chloride: 101 mmol/L (ref 98–111)
Creatinine, Ser: 0.89 mg/dL (ref 0.61–1.24)
GFR, Estimated: >60 mL/min (ref >60)
Glucose, Bld: 229 mg/dL — ABNORMAL HIGH (ref 70–99)
Potassium: 4.5 mmol/L (ref 3.5–5.1)
Sodium: 137 mmol/L (ref 135–145)

## 2020-05-24 LAB — STREP PNEUMONIAE URINARY ANTIGEN: Strep Pneumo Urinary Antigen: NEGATIVE

## 2020-05-24 LAB — CBG MONITORING, ED: Glucose-Capillary: 353 mg/dL — ABNORMAL HIGH (ref 70–99)

## 2020-05-24 LAB — HEMOGLOBIN A1C
Hgb A1c MFr Bld: 7.9 % — ABNORMAL HIGH (ref 4.8–5.6)
Mean Plasma Glucose: 180.03 mg/dL

## 2020-05-24 MED ORDER — ENOXAPARIN SODIUM 40 MG/0.4ML ~~LOC~~ SOLN
40.0000 mg | SUBCUTANEOUS | Status: DC
  Administered 2020-05-24 – 2020-05-25 (×2): 40 mg via SUBCUTANEOUS
  Filled 2020-05-24 (×3): qty 0.4

## 2020-05-24 MED ORDER — ACETAMINOPHEN 650 MG RE SUPP: 650.0000 mg | Freq: Four times a day (QID) | RECTAL | Status: DC | PRN

## 2020-05-24 MED ORDER — METHYLPREDNISOLONE SODIUM SUCC 125 MG IJ SOLR
60.0000 mg | Freq: Two times a day (BID) | INTRAMUSCULAR | Status: AC
  Administered 2020-05-24 (×2): 60 mg via INTRAVENOUS
  Filled 2020-05-24 (×2): qty 2

## 2020-05-24 MED ORDER — IPRATROPIUM-ALBUTEROL 0.5-2.5 (3) MG/3ML IN SOLN
3.0000 mL | Freq: Four times a day (QID) | RESPIRATORY_TRACT | Status: AC
  Administered 2020-05-24 – 2020-05-25 (×4): 3 mL via RESPIRATORY_TRACT
  Filled 2020-05-24 (×5): qty 3

## 2020-05-24 MED ORDER — PREDNISONE 20 MG PO TABS
40.0000 mg | ORAL_TABLET | Freq: Every day | ORAL | Status: DC
  Administered 2020-05-25: 40 mg via ORAL
  Filled 2020-05-24: qty 2

## 2020-05-24 MED ORDER — INSULIN ASPART 100 UNIT/ML ~~LOC~~ SOLN
6.0000 [IU] | Freq: Three times a day (TID) | SUBCUTANEOUS | Status: DC
  Administered 2020-05-24 – 2020-05-25 (×3): 6 [IU] via SUBCUTANEOUS
  Filled 2020-05-24: qty 1

## 2020-05-24 MED ORDER — ALPRAZOLAM 0.25 MG PO TABS
0.2500 mg | ORAL_TABLET | Freq: Two times a day (BID) | ORAL | Status: DC
  Administered 2020-05-24 – 2020-05-25 (×4): 0.25 mg via ORAL
  Filled 2020-05-24 (×5): qty 1

## 2020-05-24 MED ORDER — SODIUM CHLORIDE 0.9 % IV SOLN
500.0000 mg | INTRAVENOUS | Status: DC
  Administered 2020-05-24: 500 mg via INTRAVENOUS
  Filled 2020-05-24: qty 500

## 2020-05-24 MED ORDER — ATORVASTATIN CALCIUM 40 MG PO TABS
40.0000 mg | ORAL_TABLET | Freq: Every day | ORAL | Status: DC
  Administered 2020-05-24 – 2020-05-25 (×2): 40 mg via ORAL
  Filled 2020-05-24 (×2): qty 1

## 2020-05-24 MED ORDER — TAMSULOSIN HCL 0.4 MG PO CAPS
0.4000 mg | ORAL_CAPSULE | Freq: Every day | ORAL | Status: DC
  Administered 2020-05-24 – 2020-05-25 (×2): 0.4 mg via ORAL
  Filled 2020-05-24 (×2): qty 1

## 2020-05-24 MED ORDER — ACETAMINOPHEN 325 MG PO TABS: 650.0000 mg | ORAL_TABLET | Freq: Four times a day (QID) | ORAL | Status: DC | PRN

## 2020-05-24 MED ORDER — GUAIFENESIN-DM 100-10 MG/5ML PO SYRP: 10.0000 mL | ORAL_SOLUTION | Freq: Three times a day (TID) | ORAL | Status: DC | PRN

## 2020-05-24 MED ORDER — FUROSEMIDE 10 MG/ML IJ SOLN
20.0000 mg | Freq: Once | INTRAMUSCULAR | Status: AC
  Administered 2020-05-24: 20 mg via INTRAVENOUS
  Filled 2020-05-24: qty 2

## 2020-05-24 MED ORDER — INSULIN GLARGINE 100 UNIT/ML ~~LOC~~ SOLN
15.0000 [IU] | Freq: Every day | SUBCUTANEOUS | Status: DC
  Administered 2020-05-24 – 2020-05-25 (×2): 15 [IU] via SUBCUTANEOUS
  Filled 2020-05-24 (×3): qty 0.15

## 2020-05-24 MED ORDER — INSULIN ASPART 100 UNIT/ML ~~LOC~~ SOLN
0.0000 [IU] | Freq: Every day | SUBCUTANEOUS | Status: DC
  Administered 2020-05-24: 3 [IU] via SUBCUTANEOUS

## 2020-05-24 MED ORDER — SERTRALINE HCL 50 MG PO TABS
50.0000 mg | ORAL_TABLET | Freq: Every day | ORAL | Status: DC
  Administered 2020-05-24 – 2020-05-25 (×2): 50 mg via ORAL
  Filled 2020-05-24 (×2): qty 1

## 2020-05-24 MED ORDER — ONDANSETRON HCL 4 MG PO TABS: 4.0000 mg | ORAL_TABLET | Freq: Four times a day (QID) | ORAL | Status: DC | PRN

## 2020-05-24 MED ORDER — SODIUM CHLORIDE 0.9 % IV SOLN
1.0000 g | Freq: Once | INTRAVENOUS | Status: AC
  Administered 2020-05-24: 1 g via INTRAVENOUS
  Filled 2020-05-24: qty 10

## 2020-05-24 MED ORDER — PNEUMOCOCCAL VAC POLYVALENT 25 MCG/0.5ML IJ INJ
0.5000 mL | INJECTION | INTRAMUSCULAR | Status: AC
  Administered 2020-05-25: 0.5 mL via INTRAMUSCULAR
  Filled 2020-05-24: qty 0.5

## 2020-05-24 MED ORDER — EMPAGLIFLOZIN 25 MG PO TABS
25.0000 mg | ORAL_TABLET | Freq: Every evening | ORAL | Status: DC
  Filled 2020-05-24: qty 1

## 2020-05-24 MED ORDER — ASPIRIN EC 81 MG PO TBEC
81.0000 mg | DELAYED_RELEASE_TABLET | Freq: Every day | ORAL | Status: DC
  Administered 2020-05-24 (×2): 81 mg via ORAL
  Filled 2020-05-24 (×2): qty 1

## 2020-05-24 MED ORDER — POLYETHYLENE GLYCOL 3350 17 G PO PACK: 17.0000 g | PACK | Freq: Every day | ORAL | Status: DC | PRN

## 2020-05-24 MED ORDER — DILTIAZEM HCL ER COATED BEADS 240 MG PO CP24
240.0000 mg | ORAL_CAPSULE | Freq: Every day | ORAL | Status: DC
  Administered 2020-05-24 – 2020-05-25 (×2): 240 mg via ORAL
  Filled 2020-05-24 (×3): qty 1

## 2020-05-24 MED ORDER — INSULIN ASPART 100 UNIT/ML ~~LOC~~ SOLN
0.0000 [IU] | Freq: Three times a day (TID) | SUBCUTANEOUS | Status: DC
  Administered 2020-05-24: 7 [IU] via SUBCUTANEOUS
  Administered 2020-05-25 (×2): 11 [IU] via SUBCUTANEOUS
  Filled 2020-05-24: qty 1

## 2020-05-24 MED ORDER — BUDESONIDE 0.25 MG/2ML IN SUSP
0.2500 mg | Freq: Two times a day (BID) | RESPIRATORY_TRACT | Status: DC
  Administered 2020-05-24 – 2020-05-25 (×3): 0.25 mg via RESPIRATORY_TRACT
  Filled 2020-05-24 (×3): qty 2

## 2020-05-24 MED ORDER — ONDANSETRON HCL 4 MG/2ML IJ SOLN: 4.0000 mg | Freq: Four times a day (QID) | INTRAMUSCULAR | Status: DC | PRN

## 2020-05-24 MED ORDER — IPRATROPIUM-ALBUTEROL 0.5-2.5 (3) MG/3ML IN SOLN: 3.0000 mL | RESPIRATORY_TRACT | Status: DC | PRN

## 2020-05-24 MED ORDER — PANTOPRAZOLE SODIUM 40 MG PO TBEC
40.0000 mg | DELAYED_RELEASE_TABLET | Freq: Every day | ORAL | Status: DC
  Administered 2020-05-24 – 2020-05-25 (×2): 40 mg via ORAL
  Filled 2020-05-24 (×2): qty 1

## 2020-05-24 MED ORDER — GLIMEPIRIDE 2 MG PO TABS
1.0000 mg | ORAL_TABLET | Freq: Every day | ORAL | Status: DC
  Administered 2020-05-24: 1 mg via ORAL
  Filled 2020-05-24 (×2): qty 1

## 2020-05-24 MED ORDER — GABAPENTIN 300 MG PO CAPS
300.0000 mg | ORAL_CAPSULE | Freq: Every day | ORAL | Status: DC
  Administered 2020-05-24 (×2): 300 mg via ORAL
  Filled 2020-05-24 (×2): qty 1

## 2020-05-24 NOTE — Evaluation (Signed)
Clinical/Bedside Swallow Evaluation Patient Details  Name: [name] MRN: [id number] Date of Birth: 1955-11-22  Today's Date: 05/24/2020 Time: SLP Start Time (ACUTE ONLY): 1694 SLP Stop Time (ACUTE ONLY): 1323 SLP Time Calculation (min) (ACUTE ONLY): 16 min  Past Medical History:  Past Medical History:  Diagnosis Date  . Anxiety   . Asthma   . Chest pain    a. 2003 Cath: nl cors.  [name] COPD (chronic obstructive pulmonary disease) ([place])   . Depression   . Diabetes mellitus   . Dyslipidemia   . Dysrhythmia   . Fracture 12/15   lower back "L-1"  . GERD (gastroesophageal reflux disease)   . Hypertension   . PONV (postoperative nausea and vomiting)   . Splenomegaly    Past Surgical History:  Past Surgical History:  Procedure Laterality Date  . BIOPSY  07/26/2016   Procedure: BIOPSY;  Surgeon: [name], MD;  [facility];  Service: Endoscopy;;  gastric  . CARDIAC CATHETERIZATION  10/22/2001   normal coronary arteriea (Dr. Domenic Moras)  . COLONOSCOPY  08/31/2011   HWT:UUEKCMKL rectal and colon polyps-treated. Single tubular adenoma. Next TCS 08/2016  . COLONOSCOPY N/A 07/26/2016   Dr. [name]: diverticulosis in sigmoid and descending colon. 6 mm benign splenic flexure. surveillance 5 yeras  . ESOPHAGOGASTRODUODENOSCOPY N/A 07/26/2016   Dr. [name]: normal esophagus, portal gastropathy, chronic gastritis, normal duodenum, screening EGD 2 years   . ESOPHAGOGASTRODUODENOSCOPY (EGD) WITH ESOPHAGEAL DILATION N/A 05/15/2013   KJZ:PHXTAV esophagus-status post Venia Minks dilation/Portal gastropathy. Antral erosions-status post biopsy. extrinsic compression along lesser curvature likely secondary to splenomegaly. gastric bx benign  . HERNIA REPAIR  03/2009  . POLYPECTOMY  07/26/2016   Procedure: POLYPECTOMY;  Surgeon: [name], MD;  Location: [facility];  Service: Endoscopy;;  colon  . TRANSTHORACIC ECHOCARDIOGRAM  2011   EF 69-79%, stage 1 diastolic dysfunction, trace TR &  pulm valve regurg   . VASECTOMY  1989   HPI:  [name] is a 64 y.o. male with medical history significant for COPD-, [name] liver cirrhosis, hypertension, diabetes mellitus. Patient went to see his pulmonologist Dr. Melvyn Novas today with complaints of difficulty breathing and cough.  He was referred to the ED, as there was a concern for pneumonia and his O2 sats were in the 80s on his home 2 L of oxygen, and was required 4 L. Patient has no leukocytosis, but is found to have new right-sided opacification concerning for pneumonia.  Covid negative.  BNP elevated w/o Hx of CHF. With new oxygen requirement, patient started on IV antibiotics, required admission for further monitoring, management. BSE requested.    Assessment / Plan / Recommendation Clinical Impression  Clinical swallow evaluation completed. Oral motor examination is WNL. Pt denies difficulty swallowing. Pt reports being on 2L O2 at home and now requiring 4L. Pt consumed ice chips, thin water via cup/straw, diced peaches, and graham crackers with no overt signs or symptoms of aspiration. SLP discussed swallowing/respiration reciprocity in individuals with COPD. Pt was encouraged to take small sips of liquids to allow for periods of apnea with swallow in setting of respiratory compromise. Pt ok to continue regular textures and thin liquids with standard aspiration and reflux precautions. No further SLP services indicated at this time. SLP will sign off.    SLP Visit Diagnosis: Dysphagia, unspecified (R13.10)    Aspiration Risk  No limitations    Diet Recommendation Regular;Thin liquid   Liquid Administration via: Cup;Straw Medication Administration: Whole meds  with liquid Supervision: Patient able to self feed Postural Changes: Seated upright at 90 degrees;Remain upright for at least 30 minutes after po intake    Other  Recommendations Oral Care Recommendations: Oral care BID Other Recommendations: Clarify dietary restrictions    Follow up Recommendations None      Frequency and Duration            Prognosis Prognosis for Safe Diet Advancement: Good      Swallow Study   General Date of Onset: 05/23/20 HPI: [name] is a 64 y.o. male with medical history significant for COPD-, [name] liver cirrhosis, hypertension, diabetes mellitus. Patient went to see his pulmonologist Dr. Melvyn Novas today with complaints of difficulty breathing and cough.  He was referred to the ED, as there was a concern for pneumonia and his O2 sats were in the 80s on his home 2 L of oxygen, and was required 4 L. Patient has no leukocytosis, but is found to have new right-sided opacification concerning for pneumonia.  Covid negative.  BNP elevated w/o Hx of CHF. With new oxygen requirement, patient started on IV antibiotics, required admission for further monitoring, management. BSE requested.  Type of Study: Bedside Swallow Evaluation Previous Swallow Assessment: None on record Diet Prior to this Study: Regular;Thin liquids Temperature Spikes Noted: No Respiratory Status: Nasal cannula History of Recent Intubation: No Behavior/Cognition: Alert;Cooperative;Pleasant mood Oral Cavity Assessment: Within Functional Limits Oral Care Completed by SLP: No Oral Cavity - Dentition: Dentures, top Vision: Functional for self-feeding Self-Feeding Abilities: Able to feed self Patient Positioning: Upright in bed Baseline Vocal Quality: Normal Volitional Cough: Strong Volitional Swallow: Able to elicit    Oral/Motor/Sensory Function Overall Oral Motor/Sensory Function: Within functional limits   Ice Chips Ice chips: Not tested   Thin Liquid Thin Liquid: Within functional limits Presentation: Cup;Self Fed;Straw    Nectar Thick Nectar Thick Liquid: Not tested   Honey Thick Honey Thick Liquid: Not tested   Puree Puree: Within functional limits Presentation: Spoon   Solid     Solid: Within functional limits Presentation: Self Fed     Thank  you,  Genene Churn, [place]  Rochell Puett 05/24/2020,1:19 PM

## 2020-05-24 NOTE — Progress Notes (Signed)
PROGRESS NOTE   [name]  VEH:[id number] DOB: 16-Mar-1956 DOA: 05/23/2020 PCP: Asencion Noble, MD   Chief Complaint  Patient presents with  . Shortness of Breath    Brief Admission History:  64 y.o. male with medical history significant for COPD-, [place] liver cirrhosis, hypertension, diabetes mellitus. Patient went to see his pulmonologist Dr. Melvyn Novas today with complaints of difficulty breathing and cough.  He was referred to the ED, as there was a concern for pneumonia and his O2 sats were in the 80s on his home 2 L of oxygen, and was required 4 L.  Patient reports difficulty breathing and cough progressing over the past 3 days.  He denies chest pain.  No lower extremity swelling.  He is unaware of any fevers or chills.  Denies difficulty swallowing when eating.  No vomiting.  He was admitted with increasing oxygen requirement COPD exacerbation and community acquired pneumonia.   Assessment & Plan:   Principal Problem:   Acute on chronic respiratory failure with hypoxia ([name]) Active Problems:   HTN (hypertension)   Type 2 diabetes mellitus without complication, without long-term current use of insulin ([name])   Liver fibrosis   COPD with acute exacerbation ([name])   COPD GOLD III   PNA (pneumonia)  1. CAP - pt continues to have increased work of breathing, continue IV antibiotics and supportive measures.  He still requires 4L/min [place] which is 2L above his baseline.  2. Acute COPD exacerbation - continue IV steroids, scheduled bronchodilators, mucolytics and antibiotics. He has Gold stage III disease and will need outpatient follow up with his pulmonologist Dr. Melvyn Novas.  3. Poorly controlled type 2 diabetes mellitus - uncontrolled as evidenced by an A1c of 7.9%.   4. Acute on chronic respiratory failure with hypoxia - wean oxygen to baseline as able.  5. Essential hypertension - stable on home diltiazem, tamsulosin. 6. NASH liver cirrhosis - stable, outpatient follow up.  7. Depression /  anxiety - resumed home alprazolam, sertraline.   DVT prophylaxis: enoxaparin  Code Status: Full  Family Communication: spouse telephone 11/16 Disposition: Home   Status is: Inpatient  Remains inpatient appropriate because:IV treatments appropriate due to intensity of illness or inability to take PO and Inpatient level of care appropriate due to severity of illness  Dispo: The patient is from: Home              Anticipated d/c is to: Home              Anticipated d/c date is: 1 day              Patient currently is not medically stable to d/c.  Hopefully can wean oxygen some more today and get him home tomorrow with close outpatient follow up with his PCP and pulmonologist.   Consultants:     Procedures:     Antimicrobials:  Azithromycin 11/15>> Ceftriaxone 11/15>>  Subjective: Pt able to speak with less shortness of breath, hasn't been able to ambulate yet, still with diffuse chest tightness and congestion, productive cough with yellow sputum production  Objective: Vitals:   05/24/20 1215 05/24/20 1230 05/24/20 1245 05/24/20 1300  BP:  122/68  113/79  Pulse: (!) 108 98 94 (!) 104  Resp: (!) 28 (S) (!) 28 20 (!) 22  Temp:  98 F (36.7 C)    TempSrc:  Oral    SpO2: 90% 94% 92% 91%  Weight:      Height:  Intake/Output Summary (Last 24 hours) at 05/24/2020 1359 Last data filed at 05/23/2020 2120 Gross per 24 hour  Intake 349.94 ml  Output --  Net 349.94 ml   Filed Weights   05/23/20 1540  Weight: 75.2 kg   Examination:  General exam: awake, alert, Appears calm and comfortable  Respiratory system: diffuse bibasilar expiratory wheezes, scattered crackles and rales heard. Moderate increased work of breathing. Cardiovascular system: normal S1 & S2 heard. mild JVD, No murmurs, rubs, gallops or clicks. No pedal edema. Gastrointestinal system: Abdomen is nondistended, soft and nontender. No organomegaly or masses felt. Normal bowel sounds heard. Central  nervous system: Alert and oriented. No focal neurological deficits. Extremities: 1+ edema BLEs. Symmetric 5 x 5 power. Skin: No rashes, lesions or ulcers Psychiatry: Judgement and insight appear normal. Mood & affect appropriate.   Data Reviewed: I have personally reviewed following labs and imaging studies  CBC: Recent Labs  Lab 05/23/20 1615 05/24/20 0427  WBC 10.4 5.8  NEUTROABS 8.4*  --   HGB 14.9 15.0  HCT 48.9 49.1  MCV 81.2 81.6  PLT 86* 88*    Basic Metabolic Panel: Recent Labs  Lab 05/23/20 1615 05/24/20 0427  NA 135 137  K 4.0 4.5  CL 99 101  CO2 25 26  GLUCOSE 184* 229*  BUN 22 22  CREATININE 1.06 0.89  CALCIUM 9.2 9.3    GFR: Estimated Creatinine Clearance: 78.4 mL/min (by C-G formula based on SCr of 0.89 mg/dL).  Liver Function Tests: Recent Labs  Lab 05/23/20 1615  AST 18  ALT 27  ALKPHOS 82  BILITOT 1.4*  PROT 8.5*  ALBUMIN 4.0    CBG: No results for input(s): GLUCAP in the last 168 hours.  Recent Results (from the past 240 hour(s))  Respiratory Panel by RT PCR (Flu A&B, Covid) - Nasopharyngeal Swab     Status: None   Collection Time: 05/23/20  4:04 PM   Specimen: Nasopharyngeal Swab  Result Value Ref Range Status   SARS Coronavirus 2 by RT PCR NEGATIVE NEGATIVE Final    Comment: (NOTE) SARS-CoV-2 target nucleic acids are NOT DETECTED.  The SARS-CoV-2 RNA is generally detectable in upper respiratoy specimens during the acute phase of infection. The lowest concentration of SARS-CoV-2 viral copies this assay can detect is 131 copies/mL. A negative result does not preclude SARS-Cov-2 infection and should not be used as the sole basis for treatment or other patient management decisions. A negative result may occur with  improper specimen collection/handling, submission of specimen other than nasopharyngeal swab, presence of viral mutation(s) within the areas targeted by this assay, and inadequate number of viral copies (<131  copies/mL). A negative result must be combined with clinical observations, patient history, and epidemiological information. The expected result is Negative.  Fact Sheet for Patients:  PinkCheek.be  Fact Sheet for Healthcare Providers:  GravelBags.it  This test is no t yet approved or cleared by the [place] FDA and  has been authorized for detection and/or diagnosis of SARS-CoV-2 by FDA under an Emergency Use Authorization (EUA). This EUA will remain  in effect (meaning this test can be used) for the duration of the COVID-19 declaration under Section 564(b)(1) of the Act, 21 U.S.C. section 360bbb-3(b)(1), unless the authorization is terminated or revoked sooner.     Influenza A by PCR NEGATIVE NEGATIVE Final   Influenza B by PCR NEGATIVE NEGATIVE Final    Comment: (NOTE) The Xpert Xpress SARS-CoV-2/FLU/RSV assay is intended as an aid in  the  diagnosis of influenza from Nasopharyngeal swab specimens and  should not be used as a sole basis for treatment. Nasal washings and  aspirates are unacceptable for Xpert Xpress SARS-CoV-2/FLU/RSV  testing.  Fact Sheet for Patients: PinkCheek.be  Fact Sheet for Healthcare Providers: GravelBags.it  This test is not yet approved or cleared by the [place] FDA and  has been authorized for detection and/or diagnosis of SARS-CoV-2 by  FDA under an Emergency Use Authorization (EUA). This EUA will remain  in effect (meaning this test can be used) for the duration of the  Covid-19 declaration under Section 564(b)(1) of the Act, 21  U.S.C. section 360bbb-3(b)(1), unless the authorization is  terminated or revoked. Performed at [facility] [address], [place], [place] [address]      Radiology Studies: DG Chest Portable 1 View  Result Date: 05/23/2020 CLINICAL DATA:  Cough EXAM: PORTABLE CHEST 1 VIEW COMPARISON:   April 17, 2020 FINDINGS: The heart size and mediastinal contours are unchanged with mild cardiomegaly. There is prominence of the central pulmonary vasculature. Mildly increased hazy airspace opacity seen at the periphery of the right lung base and streaky airspace atelectasis in the right mid lung. There is calcified granulomata seen within the right lung apex. No acute osseous abnormality. IMPRESSION: Hazy airspace opacity at the periphery of the right lung base which could be due to atelectasis and/or infectious etiology. Pulmonary vascular congestion. Electronically Signed   By: Prudencio Pair M.D.   On: 05/23/2020 16:17   Scheduled Meds: . ALPRAZolam  0.25 mg Oral BID  . aspirin EC  81 mg Oral QHS  . atorvastatin  40 mg Oral Daily  . budesonide (PULMICORT) nebulizer solution  0.25 mg Nebulization BID  . diltiazem  240 mg Oral Daily  . enoxaparin (LOVENOX) injection  40 mg Subcutaneous Q24H  . furosemide  20 mg Intravenous Once  . gabapentin  300 mg Oral QHS  . insulin aspart  0-20 Units Subcutaneous TID WC  . insulin aspart  0-5 Units Subcutaneous QHS  . insulin aspart  6 Units Subcutaneous TID WC  . ipratropium-albuterol  3 mL Nebulization Q6H  . methylPREDNISolone (SOLU-MEDROL) injection  60 mg Intravenous Q12H   Followed by  . [START ON 05/25/2020] predniSONE  40 mg Oral Q breakfast  . pantoprazole  40 mg Oral QAC breakfast  . sertraline  50 mg Oral Daily  . tamsulosin  0.4 mg Oral Daily   Continuous Infusions: . azithromycin    . cefTRIAXone (ROCEPHIN)  IV       LOS: 1 day   Time spent: 35 mins    Shamere Campas Wynetta Emery, MD How to contact the [facility] Attending or Consulting provider [place] or covering provider during after hours [place], for this patient?  1. Check the care team in [facility] and look for a) attending/consulting [facility] provider listed and b) the [facility] team listed 2. Log into [email or web address] and use [place]'s universal password to access. If you do not have the password,  please contact the hospital operator. 3. Locate the [facility] provider you are looking for under [facility] and page to a number that you can be directly reached. 4. If you still have difficulty reaching the provider, please page the [facility] (Director on Call) for the Hospitalists listed on amion for assistance.  05/24/2020, 1:59 PM

## 2020-05-24 NOTE — ED Notes (Signed)
Assumed care of pt at 0700.  Pt sleeping at this time.

## 2020-05-25 DIAGNOSIS — J9621 Acute and chronic respiratory failure with hypoxia: Secondary | ICD-10-CM | POA: Diagnosis not present

## 2020-05-25 DIAGNOSIS — I10 Essential (primary) hypertension: Secondary | ICD-10-CM | POA: Diagnosis not present

## 2020-05-25 DIAGNOSIS — J181 Lobar pneumonia, unspecified organism: Principal | ICD-10-CM

## 2020-05-25 DIAGNOSIS — J441 Chronic obstructive pulmonary disease with (acute) exacerbation: Secondary | ICD-10-CM | POA: Diagnosis not present

## 2020-05-25 LAB — HIV ANTIBODY (ROUTINE TESTING W REFLEX): HIV Screen 4th Generation wRfx: NONREACTIVE

## 2020-05-25 LAB — CBC
HCT: 45.4 % (ref 39.0–52.0)
Hemoglobin: 14.1 g/dL (ref 13.0–17.0)
MCH: 25 pg — ABNORMAL LOW (ref 26.0–34.0)
MCHC: 31.1 g/dL (ref 30.0–36.0)
MCV: 80.6 fL (ref 80.0–100.0)
Platelets: 94 10*3/uL — ABNORMAL LOW (ref 150–400)
RBC: 5.63 MIL/uL (ref 4.22–5.81)
RDW: 14.7 % (ref 11.5–15.5)
WBC: 6.4 10*3/uL (ref 4.0–10.5)
nRBC: 0 % (ref 0.0–0.2)

## 2020-05-25 LAB — BASIC METABOLIC PANEL WITH GFR
Anion gap: 11 (ref 5–15)
BUN: 29 mg/dL — ABNORMAL HIGH (ref 8–23)
CO2: 25 mmol/L (ref 22–32)
Calcium: 8.8 mg/dL — ABNORMAL LOW (ref 8.9–10.3)
Chloride: 102 mmol/L (ref 98–111)
Creatinine, Ser: 0.77 mg/dL (ref 0.61–1.24)
GFR, Estimated: >60 mL/min
Glucose, Bld: 293 mg/dL — ABNORMAL HIGH (ref 70–99)
Potassium: 4.1 mmol/L (ref 3.5–5.1)
Sodium: 138 mmol/L (ref 135–145)

## 2020-05-25 LAB — GLUCOSE, CAPILLARY
Glucose-Capillary: 290 mg/dL — ABNORMAL HIGH (ref 70–99)
Glucose-Capillary: 294 mg/dL — ABNORMAL HIGH (ref 70–99)
Glucose-Capillary: 270 mg/dL — ABNORMAL HIGH (ref 70–99)

## 2020-05-25 LAB — MAGNESIUM: Magnesium: 2.3 mg/dL (ref 1.7–2.4)

## 2020-05-25 MED ORDER — CEFDINIR 300 MG PO CAPS
300.0000 mg | ORAL_CAPSULE | Freq: Two times a day (BID) | ORAL | 0 refills | Status: DC
Start: 2020-05-25 — End: 2020-06-13

## 2020-05-25 MED ORDER — PREDNISONE 10 MG PO TABS
50.0000 mg | ORAL_TABLET | Freq: Every day | ORAL | 0 refills | Status: DC
Start: 2020-05-26 — End: 2020-05-25

## 2020-05-25 MED ORDER — PREDNISONE 10 MG PO TABS
50.0000 mg | ORAL_TABLET | Freq: Every day | ORAL | 0 refills | Status: DC
Start: 2020-05-26 — End: 2020-06-13

## 2020-05-25 MED ORDER — ALBUTEROL SULFATE (2.5 MG/3ML) 0.083% IN NEBU: 2.5000 mg | INHALATION_SOLUTION | RESPIRATORY_TRACT | 1 refills | Status: DC | PRN

## 2020-05-25 MED ORDER — CEFDINIR 300 MG PO CAPS: 300.0000 mg | ORAL_CAPSULE | Freq: Two times a day (BID) | ORAL | 0 refills | Status: DC

## 2020-05-25 MED ORDER — AZITHROMYCIN 250 MG PO TABS: 500.0000 mg | ORAL_TABLET | Freq: Every day | ORAL | Status: DC

## 2020-05-25 MED ORDER — AZITHROMYCIN 500 MG PO TABS
500.0000 mg | ORAL_TABLET | Freq: Every day | ORAL | 0 refills | Status: DC
Start: 2020-05-25 — End: 2020-06-13

## 2020-05-25 MED ORDER — AZITHROMYCIN 500 MG PO TABS
500.0000 mg | ORAL_TABLET | Freq: Every day | ORAL | 0 refills | Status: DC
Start: 2020-05-25 — End: 2020-05-25

## 2020-05-25 NOTE — Progress Notes (Signed)
Nsg Discharge Note  Admit Date:  05/23/2020 Discharge date: 05/25/2020   [name] to be D/C'd Home per MD order.  AVS completed.  Copy for chart, and copy for patient signed, and dated. Patient/caregiver able to verbalize understanding.  Discharge Medication: Allergies as of 05/25/2020      Reactions   Baclofen    lethargic    Simvastatin Nausea Only      Medication List    STOP taking these medications   HYDROcodone-acetaminophen 5-325 MG tablet Commonly known as: NORCO/VICODIN     TAKE these medications   acetaminophen 500 MG tablet Commonly known as: TYLENOL Take 1,000 mg by mouth daily as needed for headache.   ALPRAZolam 0.25 MG tablet Commonly known as: XANAX Take 0.25 mg by mouth 2 (two) times daily.   aspirin EC 81 MG tablet Take 81 mg by mouth at bedtime.   atorvastatin 40 MG tablet Commonly known as: LIPITOR Take 1 tablet (40 mg total) by mouth daily. Please make annual appt with Dr. Debara Pickett for refills. [phone]. 3rd& final attempt.   azithromycin 500 MG tablet Commonly known as: ZITHROMAX Take 1 tablet (500 mg total) by mouth daily.   cefdinir 300 MG capsule Commonly known as: OMNICEF Take 1 capsule (300 mg total) by mouth 2 (two) times daily.   diltiazem 240 MG 24 hr capsule Commonly known as: CARDIZEM CD TAKE 1 CAPSULE BY MOUTH EVERY DAY   gabapentin 300 MG capsule Commonly known as: NEURONTIN Take 1 capsule by mouth at bedtime.   glimepiride 1 MG tablet Commonly known as: AMARYL Take 1 mg by mouth daily.   Janumet XR [phone] MG Tb24 Generic drug: SitaGLIPtin-MetFORMIN HCl Take 1 tablet by mouth daily.   Jardiance 25 MG Tabs tablet Generic drug: empagliflozin Take 25 mg by mouth every evening.   multivitamin with minerals Tabs tablet Take 1 tablet by mouth at bedtime.   OneTouch Delica Lancets 99B Misc   OneTouch Verio test strip Generic drug: glucose blood   OXYGEN Inhale 4 L into the lungs. Pt uses oxygen at night  and when exerting himself   pantoprazole 40 MG tablet Commonly known as: PROTONIX Take 1 tablet (40 mg total) by mouth daily before breakfast.   predniSONE 10 MG tablet Commonly known as: DELTASONE Take 5 tablets (50 mg total) by mouth daily with breakfast. And decrease by one tablet daily Start taking on: May 26, 2020   ProAir HFA 108 (90 Base) MCG/ACT inhaler Generic drug: albuterol Inhale 2 puffs into the lungs every 4 (four) hours as needed for wheezing or shortness of breath.   albuterol (2.5 MG/3ML) 0.083% nebulizer solution Commonly known as: PROVENTIL Take 3 mLs (2.5 mg total) by nebulization every 2 (two) hours as needed for wheezing.   sertraline 50 MG tablet Commonly known as: ZOLOFT Take 50 mg by mouth daily.   Spiriva Respimat 2.5 MCG/ACT Aers Generic drug: Tiotropium Bromide Monohydrate Inhale 2 puffs into the lungs daily.   Symbicort 160-4.5 MCG/ACT inhaler Generic drug: budesonide-formoterol Inhale 2 puffs into the lungs 2 (two) times daily.   tamsulosin 0.4 MG Caps capsule Commonly known as: FLOMAX Take 0.4 mg by mouth daily.   vitamin C 100 MG tablet Take 100 mg by mouth daily.   VITAMIN D PO Take 1 tablet by mouth daily.       Discharge Assessment: Vitals:   05/25/20 0516 05/25/20 0733  BP: 104/75   Pulse: 91   Resp: 20   Temp: 97.7 F (  36.5 C)   SpO2: 94% 94%   Skin clean, dry and intact without evidence of skin break down, no evidence of skin tears noted. IV catheter discontinued intact. Site without signs and symptoms of complications - no redness or edema noted at insertion site, patient denies c/o pain - only slight tenderness at site.  Dressing with slight pressure applied.  D/c Instructions-Education: Discharge instructions given to patient/family with verbalized understanding. D/c education completed with patient/family including follow up instructions, medication list, d/c activities limitations if indicated, with other d/c  instructions as indicated by MD - patient able to verbalize understanding, all questions fully answered. Patient instructed to return to ED, call 911, or call MD for any changes in condition.  Patient escorted via [place], and D/C home via private auto.  Dorcas Mcmurray, LPN 25/49/8264 1:58 PM

## 2020-05-25 NOTE — Discharge Summary (Addendum)
Physician Discharge Summary  [name] IRW:[id number] DOB: 06-Mar-1956 DOA: 05/23/2020  PCP: Asencion Noble, MD  Admit date: 05/23/2020 Discharge date: 05/25/2020  Admitted From: Home Disposition:  Home  Recommendations for Outpatient Follow-up:  1. Follow up with PCP in 1-2 weeks 2. Please obtain BMP/CBC in one week   Home Health:No Equipment/Devices: 2L Discharge Condition: Stable CODE STATUS: FULL Diet recommendation: Heart Healthy / Carb Modified    Brief/Interim Summary: 64 y.o.malewith medical history significant forCOPD-, [name] liver cirrhosis, hypertension, diabetes mellitus. Patient went to see his pulmonologist Dr. Melvyn Novas earlier on the day of admission with complaints of difficulty breathing and cough. He was referred to the ED, as there was a concern for pneumonia and hisO2 sats were in the 80s on his home 2 L of oxygen, andwasincreased to 4 L. Patient reports difficulty breathing and cough progressing over the past 3 days. He denies chest pain. No lower extremity swelling. He is unaware of any fevers or chills. Denies difficulty swallowing when eating. No vomiting.  He was admitted with increasing oxygen requirement COPD exacerbation and community acquired pneumonia.  He was started on IV solumedrol and IV ceftriaxone and azithromycin.  He was weaned down back to his usual home oxygen demand of 2L  Discharge Diagnoses:  1. Lobar Pneumonia - pt continues to have increased work of breathing, continue IV antibiotics and supportive measures.  He still requires 4L/min [place] which is 2L above his baseline. Weaned back to 2L at time of d/c 2. Acute COPD exacerbation - continue IV steroids, scheduled bronchodilators, mucolytics and antibiotics. He has Gold stage III disease and will need outpatient follow up with his pulmonologist Dr. Melvyn Novas.  D/c home with prednisone taper. 3. Poorly controlled type 2 diabetes mellitus - uncontrolled with hyperglycemia.  Restart Janumet,  Jardiance, amaryl after d/c.  Follow up with PCP for further adjustment. 4. Acute on chronic respiratory failure with hypoxia - initially on 4L.  Weaned back to 2 L at time of d/c 5. Essential hypertension - stable on home diltiazem 6. NASH liver cirrhosis - stable, outpatient follow up.  7. Depression / anxiety - resumed home alprazolam, sertraline.    Discharge Instructions   Allergies as of 05/25/2020      Reactions   Baclofen    lethargic    Simvastatin Nausea Only      Medication List    STOP taking these medications   HYDROcodone-acetaminophen 5-325 MG tablet Commonly known as: NORCO/VICODIN     TAKE these medications   acetaminophen 500 MG tablet Commonly known as: TYLENOL Take 1,000 mg by mouth daily as needed for headache.   ALPRAZolam 0.25 MG tablet Commonly known as: XANAX Take 0.25 mg by mouth 2 (two) times daily.   aspirin EC 81 MG tablet Take 81 mg by mouth at bedtime.   atorvastatin 40 MG tablet Commonly known as: LIPITOR Take 1 tablet (40 mg total) by mouth daily. Please make annual appt with Dr. Debara Pickett for refills. [phone]. 3rd& final attempt.   azithromycin 500 MG tablet Commonly known as: ZITHROMAX Take 1 tablet (500 mg total) by mouth daily.   cefdinir 300 MG capsule Commonly known as: OMNICEF Take 1 capsule (300 mg total) by mouth 2 (two) times daily.   diltiazem 240 MG 24 hr capsule Commonly known as: CARDIZEM CD TAKE 1 CAPSULE BY MOUTH EVERY DAY   gabapentin 300 MG capsule Commonly known as: NEURONTIN Take 1 capsule by mouth at bedtime.   glimepiride 1 MG tablet Commonly  known as: AMARYL Take 1 mg by mouth daily.   Janumet XR [phone] MG Tb24 Generic drug: SitaGLIPtin-MetFORMIN HCl Take 1 tablet by mouth daily.   Jardiance 25 MG Tabs tablet Generic drug: empagliflozin Take 25 mg by mouth every evening.   multivitamin with minerals Tabs tablet Take 1 tablet by mouth at bedtime.   OneTouch Delica Lancets 23F Misc     OneTouch Verio test strip Generic drug: glucose blood   OXYGEN Inhale 4 L into the lungs. Pt uses oxygen at night and when exerting himself   pantoprazole 40 MG tablet Commonly known as: PROTONIX Take 1 tablet (40 mg total) by mouth daily before breakfast.   predniSONE 10 MG tablet Commonly known as: DELTASONE Take 5 tablets (50 mg total) by mouth daily with breakfast. And decrease by one tablet daily Start taking on: May 26, 2020   ProAir HFA 108 (90 Base) MCG/ACT inhaler Generic drug: albuterol Inhale 2 puffs into the lungs every 4 (four) hours as needed for wheezing or shortness of breath.   albuterol (2.5 MG/3ML) 0.083% nebulizer solution Commonly known as: PROVENTIL Take 3 mLs (2.5 mg total) by nebulization every 2 (two) hours as needed for wheezing.   sertraline 50 MG tablet Commonly known as: ZOLOFT Take 50 mg by mouth daily.   Spiriva Respimat 2.5 MCG/ACT Aers Generic drug: Tiotropium Bromide Monohydrate Inhale 2 puffs into the lungs daily.   Symbicort 160-4.5 MCG/ACT inhaler Generic drug: budesonide-formoterol Inhale 2 puffs into the lungs 2 (two) times daily.   tamsulosin 0.4 MG Caps capsule Commonly known as: FLOMAX Take 0.4 mg by mouth daily.   vitamin C 100 MG tablet Take 100 mg by mouth daily.   VITAMIN D PO Take 1 tablet by mouth daily.       Allergies  Allergen Reactions  . Baclofen     lethargic   . Simvastatin Nausea Only    Consultations:  none   Procedures/Studies: DG Chest Portable 1 View  Result Date: 05/23/2020 CLINICAL DATA:  Cough EXAM: PORTABLE CHEST 1 VIEW COMPARISON:  April 17, 2020 FINDINGS: The heart size and mediastinal contours are unchanged with mild cardiomegaly. There is prominence of the central pulmonary vasculature. Mildly increased hazy airspace opacity seen at the periphery of the right lung base and streaky airspace atelectasis in the right mid lung. There is calcified granulomata seen within the right  lung apex. No acute osseous abnormality. IMPRESSION: Hazy airspace opacity at the periphery of the right lung base which could be due to atelectasis and/or infectious etiology. Pulmonary vascular congestion. Electronically Signed   By: Prudencio Pair M.D.   On: 05/23/2020 16:17        Discharge Exam: Vitals:   05/25/20 0516 05/25/20 0733  BP: 104/75   Pulse: 91   Resp: 20   Temp: 97.7 F (36.5 C)   SpO2: 94% 94%   Vitals:   05/25/20 0115 05/25/20 0153 05/25/20 0516 05/25/20 0733  BP: 114/73  104/75   Pulse: 98  91   Resp: 20  20   Temp: 98 F (36.7 C)  97.7 F (36.5 C)   TempSrc:      SpO2: 91% 92% 94% 94%  Weight:      Height:        General: Pt is alert, awake, not in acute distress Cardiovascular: RRR, S1/S2 +, no rubs, no gallops Respiratory: bibasilar rales. No wheeze Abdominal: Soft, NT, ND, bowel sounds + Extremities: no edema, no cyanosis   The results  of significant diagnostics from this hospitalization (including imaging, microbiology, ancillary and laboratory) are listed below for reference.    Significant Diagnostic Studies: DG Chest Portable 1 View  Result Date: 05/23/2020 CLINICAL DATA:  Cough EXAM: PORTABLE CHEST 1 VIEW COMPARISON:  April 17, 2020 FINDINGS: The heart size and mediastinal contours are unchanged with mild cardiomegaly. There is prominence of the central pulmonary vasculature. Mildly increased hazy airspace opacity seen at the periphery of the right lung base and streaky airspace atelectasis in the right mid lung. There is calcified granulomata seen within the right lung apex. No acute osseous abnormality. IMPRESSION: Hazy airspace opacity at the periphery of the right lung base which could be due to atelectasis and/or infectious etiology. Pulmonary vascular congestion. Electronically Signed   By: Prudencio Pair M.D.   On: 05/23/2020 16:17     Microbiology: Recent Results (from the past 240 hour(s))  Respiratory Panel by RT PCR (Flu A&B,  Covid) - Nasopharyngeal Swab     Status: None   Collection Time: 05/23/20  4:04 PM   Specimen: Nasopharyngeal Swab  Result Value Ref Range Status   SARS Coronavirus 2 by RT PCR NEGATIVE NEGATIVE Final    Comment: (NOTE) SARS-CoV-2 target nucleic acids are NOT DETECTED.  The SARS-CoV-2 RNA is generally detectable in upper respiratoy specimens during the acute phase of infection. The lowest concentration of SARS-CoV-2 viral copies this assay can detect is 131 copies/mL. A negative result does not preclude SARS-Cov-2 infection and should not be used as the sole basis for treatment or other patient management decisions. A negative result may occur with  improper specimen collection/handling, submission of specimen other than nasopharyngeal swab, presence of viral mutation(s) within the areas targeted by this assay, and inadequate number of viral copies (<131 copies/mL). A negative result must be combined with clinical observations, patient history, and epidemiological information. The expected result is Negative.  Fact Sheet for Patients:  PinkCheek.be  Fact Sheet for Healthcare Providers:  GravelBags.it  This test is no t yet approved or cleared by the [place] FDA and  has been authorized for detection and/or diagnosis of SARS-CoV-2 by FDA under an Emergency Use Authorization (EUA). This EUA will remain  in effect (meaning this test can be used) for the duration of the COVID-19 declaration under Section 564(b)(1) of the Act, 21 U.S.C. section 360bbb-3(b)(1), unless the authorization is terminated or revoked sooner.     Influenza A by PCR NEGATIVE NEGATIVE Final   Influenza B by PCR NEGATIVE NEGATIVE Final    Comment: (NOTE) The Xpert Xpress SARS-CoV-2/FLU/RSV assay is intended as an aid in  the diagnosis of influenza from Nasopharyngeal swab specimens and  should not be used as a sole basis for treatment. Nasal  washings and  aspirates are unacceptable for Xpert Xpress SARS-CoV-2/FLU/RSV  testing.  Fact Sheet for Patients: PinkCheek.be  Fact Sheet for Healthcare Providers: GravelBags.it  This test is not yet approved or cleared by the [place] FDA and  has been authorized for detection and/or diagnosis of SARS-CoV-2 by  FDA under an Emergency Use Authorization (EUA). This EUA will remain  in effect (meaning this test can be used) for the duration of the  Covid-19 declaration under Section 564(b)(1) of the Act, 21  U.S.C. section 360bbb-3(b)(1), unless the authorization is  terminated or revoked. Performed at [facility], [address] [place], [place] [address]      Labs: Basic Metabolic Panel: Recent Labs  Lab 05/23/20 1615 05/23/20 1615 05/24/20 0427 05/25/20  6815  NA 135  --  137 138  K 4.0   < > 4.5 4.1  CL 99  --  101 102  CO2 25  --  26 25  GLUCOSE 184*  --  229* 293*  BUN 22  --  22 29*  CREATININE 1.06  --  0.89 0.77  CALCIUM 9.2  --  9.3 8.8*  MG  --   --   --  2.3   < > = values in this interval not displayed.   Liver Function Tests: Recent Labs  Lab 05/23/20 1615  AST 18  ALT 27  ALKPHOS 82  BILITOT 1.4*  PROT 8.5*  ALBUMIN 4.0   No results for input(s): LIPASE, AMYLASE in the last 168 hours. No results for input(s): AMMONIA in the last 168 hours. CBC: Recent Labs  Lab 05/23/20 1615 05/24/20 0427 05/25/20 0633  WBC 10.4 5.8 6.4  NEUTROABS 8.4*  --   --   HGB 14.9 15.0 14.1  HCT 48.9 49.1 45.4  MCV 81.2 81.6 80.6  PLT 86* 88* 94*   Cardiac Enzymes: No results for input(s): CKTOTAL, CKMB, CKMBINDEX, TROPONINI in the last 168 hours. BNP: Invalid input(s): POCBNP CBG: Recent Labs  Lab 05/24/20 1643 05/24/20 2223 05/25/20 0302 05/25/20 0743  GLUCAP 353* 266* 290* 294*    Time coordinating discharge:  36 minutes  Signed:  Orson Eva, DO [facility] Pager:  [phone] 05/25/2020, 10:24 AM

## 2020-05-26 DIAGNOSIS — J449 Chronic obstructive pulmonary disease, unspecified: Secondary | ICD-10-CM | POA: Diagnosis not present

## 2020-06-08 DIAGNOSIS — E1140 Type 2 diabetes mellitus with diabetic neuropathy, unspecified: Secondary | ICD-10-CM | POA: Diagnosis not present

## 2020-06-08 DIAGNOSIS — J181 Lobar pneumonia, unspecified organism: Secondary | ICD-10-CM | POA: Diagnosis not present

## 2020-06-08 DIAGNOSIS — J9611 Chronic respiratory failure with hypoxia: Secondary | ICD-10-CM | POA: Diagnosis not present

## 2020-06-10 ENCOUNTER — Inpatient Hospital Stay (HOSPITAL_COMMUNITY)
Admission: EM | Admit: 2020-06-10 | Discharge: 2020-06-13 | DRG: 190 | Disposition: A | Discharge status: Home / Self Care | Payer: PPO | Attending: Family Medicine | Admitting: Family Medicine

## 2020-06-10 ENCOUNTER — Encounter (HOSPITAL_COMMUNITY): Payer: Self-pay | Admitting: Emergency Medicine

## 2020-06-10 ENCOUNTER — Other Ambulatory Visit: Payer: Self-pay

## 2020-06-10 ENCOUNTER — Emergency Department (HOSPITAL_COMMUNITY): Payer: PPO

## 2020-06-10 DIAGNOSIS — E1165 Type 2 diabetes mellitus with hyperglycemia: Secondary | ICD-10-CM | POA: Diagnosis present

## 2020-06-10 DIAGNOSIS — Z20822 Contact with and (suspected) exposure to covid-19: Secondary | ICD-10-CM | POA: Diagnosis not present

## 2020-06-10 DIAGNOSIS — J449 Chronic obstructive pulmonary disease, unspecified: Secondary | ICD-10-CM | POA: Diagnosis present

## 2020-06-10 DIAGNOSIS — I10 Essential (primary) hypertension: Secondary | ICD-10-CM | POA: Diagnosis present

## 2020-06-10 DIAGNOSIS — E785 Hyperlipidemia, unspecified: Secondary | ICD-10-CM | POA: Diagnosis not present

## 2020-06-10 DIAGNOSIS — Z79899 Other long term (current) drug therapy: Secondary | ICD-10-CM | POA: Diagnosis not present

## 2020-06-10 DIAGNOSIS — K219 Gastro-esophageal reflux disease without esophagitis: Secondary | ICD-10-CM | POA: Diagnosis present

## 2020-06-10 DIAGNOSIS — E871 Hypo-osmolality and hyponatremia: Secondary | ICD-10-CM | POA: Diagnosis not present

## 2020-06-10 DIAGNOSIS — N400 Enlarged prostate without lower urinary tract symptoms: Secondary | ICD-10-CM | POA: Diagnosis not present

## 2020-06-10 DIAGNOSIS — Z7951 Long term (current) use of inhaled steroids: Secondary | ICD-10-CM

## 2020-06-10 DIAGNOSIS — K7581 Nonalcoholic steatohepatitis (NASH): Secondary | ICD-10-CM | POA: Diagnosis present

## 2020-06-10 DIAGNOSIS — Z7984 Long term (current) use of oral hypoglycemic drugs: Secondary | ICD-10-CM | POA: Diagnosis not present

## 2020-06-10 DIAGNOSIS — T380X5A Adverse effect of glucocorticoids and synthetic analogues, initial encounter: Secondary | ICD-10-CM | POA: Diagnosis not present

## 2020-06-10 DIAGNOSIS — E875 Hyperkalemia: Secondary | ICD-10-CM | POA: Diagnosis not present

## 2020-06-10 DIAGNOSIS — J441 Chronic obstructive pulmonary disease with (acute) exacerbation: Secondary | ICD-10-CM | POA: Diagnosis not present

## 2020-06-10 DIAGNOSIS — J181 Lobar pneumonia, unspecified organism: Secondary | ICD-10-CM | POA: Diagnosis not present

## 2020-06-10 DIAGNOSIS — D6959 Other secondary thrombocytopenia: Secondary | ICD-10-CM | POA: Diagnosis present

## 2020-06-10 DIAGNOSIS — F32A Depression, unspecified: Secondary | ICD-10-CM | POA: Diagnosis not present

## 2020-06-10 DIAGNOSIS — Z7952 Long term (current) use of systemic steroids: Secondary | ICD-10-CM

## 2020-06-10 DIAGNOSIS — K760 Fatty (change of) liver, not elsewhere classified: Secondary | ICD-10-CM | POA: Diagnosis present

## 2020-06-10 DIAGNOSIS — F419 Anxiety disorder, unspecified: Secondary | ICD-10-CM | POA: Diagnosis not present

## 2020-06-10 DIAGNOSIS — J9621 Acute and chronic respiratory failure with hypoxia: Secondary | ICD-10-CM | POA: Diagnosis present

## 2020-06-10 DIAGNOSIS — J9601 Acute respiratory failure with hypoxia: Secondary | ICD-10-CM

## 2020-06-10 DIAGNOSIS — Z87891 Personal history of nicotine dependence: Secondary | ICD-10-CM

## 2020-06-10 DIAGNOSIS — Z7982 Long term (current) use of aspirin: Secondary | ICD-10-CM | POA: Diagnosis not present

## 2020-06-10 DIAGNOSIS — E119 Type 2 diabetes mellitus without complications: Secondary | ICD-10-CM

## 2020-06-10 DIAGNOSIS — R0600 Dyspnea, unspecified: Secondary | ICD-10-CM | POA: Diagnosis not present

## 2020-06-10 LAB — MRSA PCR SCREENING: MRSA by PCR: NEGATIVE

## 2020-06-10 LAB — CBC WITH DIFFERENTIAL/PLATELET
Abs Immature Granulocytes: 0.02 10*3/uL (ref 0.00–0.07)
Basophils Absolute: 0 10*3/uL (ref 0.0–0.1)
Basophils Relative: 0 %
Eosinophils Absolute: 0 10*3/uL (ref 0.0–0.5)
Eosinophils Relative: 0 %
HCT: 49.1 % (ref 39.0–52.0)
Hemoglobin: 15.3 g/dL (ref 13.0–17.0)
Immature Granulocytes: 0 %
Lymphocytes Relative: 11 %
Lymphs Abs: 0.5 10*3/uL — ABNORMAL LOW (ref 0.7–4.0)
MCH: 25.5 pg — ABNORMAL LOW (ref 26.0–34.0)
MCHC: 31.2 g/dL (ref 30.0–36.0)
MCV: 81.8 fL (ref 80.0–100.0)
Monocytes Absolute: 0.4 10*3/uL (ref 0.1–1.0)
Monocytes Relative: 9 %
Neutro Abs: 3.6 10*3/uL (ref 1.7–7.7)
Neutrophils Relative %: 80 %
Platelets: 84 10*3/uL — ABNORMAL LOW (ref 150–400)
RBC: 6 MIL/uL — ABNORMAL HIGH (ref 4.22–5.81)
RDW: 16.1 % — ABNORMAL HIGH (ref 11.5–15.5)
WBC: 4.5 10*3/uL (ref 4.0–10.5)
nRBC: 0 % (ref 0.0–0.2)

## 2020-06-10 LAB — RESP PANEL BY RT-PCR (FLU A&B, COVID) ARPGX2
Influenza A by PCR: NEGATIVE
Influenza B by PCR: NEGATIVE
SARS Coronavirus 2 by RT PCR: NEGATIVE

## 2020-06-10 LAB — BASIC METABOLIC PANEL
Anion gap: 10 (ref 5–15)
BUN: 14 mg/dL (ref 8–23)
CO2: 25 mmol/L (ref 22–32)
Calcium: 8.6 mg/dL — ABNORMAL LOW (ref 8.9–10.3)
Chloride: 97 mmol/L — ABNORMAL LOW (ref 98–111)
Creatinine, Ser: 0.75 mg/dL (ref 0.61–1.24)
GFR, Estimated: >60 mL/min (ref >60)
Glucose, Bld: 155 mg/dL — ABNORMAL HIGH (ref 70–99)
Potassium: 4.1 mmol/L (ref 3.5–5.1)
Sodium: 132 mmol/L — ABNORMAL LOW (ref 135–145)

## 2020-06-10 LAB — GLUCOSE, CAPILLARY
Glucose-Capillary: 248 mg/dL — ABNORMAL HIGH (ref 70–99)
Glucose-Capillary: 229 mg/dL — ABNORMAL HIGH (ref 70–99)

## 2020-06-10 MED ORDER — ALBUTEROL (5 MG/ML) CONTINUOUS INHALATION SOLN
10.0000 mg/h | INHALATION_SOLUTION | Freq: Once | RESPIRATORY_TRACT | Status: AC
  Administered 2020-06-10: 10 mg/h via RESPIRATORY_TRACT
  Filled 2020-06-10: qty 20

## 2020-06-10 MED ORDER — UMECLIDINIUM BROMIDE 62.5 MCG/INH IN AEPB
1.0000 | INHALATION_SPRAY | Freq: Every day | RESPIRATORY_TRACT | Status: DC
  Administered 2020-06-11 – 2020-06-13 (×3): 1 via RESPIRATORY_TRACT
  Filled 2020-06-10: qty 7

## 2020-06-10 MED ORDER — ASPIRIN EC 81 MG PO TBEC
81.0000 mg | DELAYED_RELEASE_TABLET | Freq: Every day | ORAL | Status: DC
  Administered 2020-06-10 – 2020-06-13 (×4): 81 mg via ORAL
  Filled 2020-06-10 (×4): qty 1

## 2020-06-10 MED ORDER — SODIUM CHLORIDE 0.9 % IV SOLN
1.0000 g | INTRAVENOUS | Status: DC
  Administered 2020-06-10 – 2020-06-13 (×4): 1 g via INTRAVENOUS
  Filled 2020-06-10 (×4): qty 10

## 2020-06-10 MED ORDER — VITAMIN D 25 MCG (1000 UNIT) PO TABS
1000.0000 [IU] | ORAL_TABLET | Freq: Every day | ORAL | Status: DC
  Administered 2020-06-10 – 2020-06-13 (×4): 1000 [IU] via ORAL
  Filled 2020-06-10 (×4): qty 1

## 2020-06-10 MED ORDER — IPRATROPIUM BROMIDE 0.02 % IN SOLN
0.5000 mg | Freq: Once | RESPIRATORY_TRACT | Status: AC
  Administered 2020-06-10: 0.5 mg via RESPIRATORY_TRACT
  Filled 2020-06-10: qty 2.5

## 2020-06-10 MED ORDER — ORAL CARE MOUTH RINSE: 15.0000 mL | Freq: Two times a day (BID) | OROMUCOSAL | Status: DC

## 2020-06-10 MED ORDER — GUAIFENESIN ER 600 MG PO TB12
600.0000 mg | ORAL_TABLET | Freq: Two times a day (BID) | ORAL | Status: DC
  Administered 2020-06-10 – 2020-06-13 (×7): 600 mg via ORAL
  Filled 2020-06-10 (×7): qty 1

## 2020-06-10 MED ORDER — GABAPENTIN 300 MG PO CAPS
300.0000 mg | ORAL_CAPSULE | Freq: Every day | ORAL | Status: DC
  Administered 2020-06-10 – 2020-06-12 (×3): 300 mg via ORAL
  Filled 2020-06-10 (×3): qty 1

## 2020-06-10 MED ORDER — ACETAMINOPHEN 325 MG PO TABS: 650.0000 mg | ORAL_TABLET | Freq: Four times a day (QID) | ORAL | Status: DC | PRN

## 2020-06-10 MED ORDER — ADULT MULTIVITAMIN W/MINERALS CH: 1.0000 | ORAL_TABLET | Freq: Every day | ORAL | Status: DC

## 2020-06-10 MED ORDER — SODIUM CHLORIDE 0.9% FLUSH: 3.0000 mL | INTRAVENOUS | Status: DC | PRN

## 2020-06-10 MED ORDER — ATORVASTATIN CALCIUM 40 MG PO TABS
40.0000 mg | ORAL_TABLET | Freq: Every day | ORAL | Status: DC
  Administered 2020-06-10 – 2020-06-13 (×4): 40 mg via ORAL
  Filled 2020-06-10 (×4): qty 1

## 2020-06-10 MED ORDER — SERTRALINE HCL 50 MG PO TABS
50.0000 mg | ORAL_TABLET | Freq: Every day | ORAL | Status: DC
  Administered 2020-06-10 – 2020-06-13 (×4): 50 mg via ORAL
  Filled 2020-06-10 (×4): qty 1

## 2020-06-10 MED ORDER — SODIUM CHLORIDE 0.9% FLUSH
3.0000 mL | Freq: Two times a day (BID) | INTRAVENOUS | Status: DC
  Administered 2020-06-10: 3 mL via INTRAVENOUS

## 2020-06-10 MED ORDER — POLYETHYLENE GLYCOL 3350 17 G PO PACK: 17.0000 g | PACK | Freq: Every day | ORAL | Status: DC | PRN

## 2020-06-10 MED ORDER — DILTIAZEM HCL ER COATED BEADS 120 MG PO CP24
240.0000 mg | ORAL_CAPSULE | Freq: Every day | ORAL | Status: DC
  Administered 2020-06-10 – 2020-06-13 (×4): 240 mg via ORAL
  Filled 2020-06-10 (×2): qty 2
  Filled 2020-06-10 (×5): qty 1

## 2020-06-10 MED ORDER — METHYLPREDNISOLONE SODIUM SUCC 125 MG IJ SOLR
125.0000 mg | Freq: Once | INTRAMUSCULAR | Status: AC
  Administered 2020-06-10: 125 mg via INTRAVENOUS
  Filled 2020-06-10: qty 2

## 2020-06-10 MED ORDER — CHLORHEXIDINE GLUCONATE 0.12 % MT SOLN
15.0000 mL | Freq: Two times a day (BID) | OROMUCOSAL | Status: DC
  Administered 2020-06-10: 15 mL via OROMUCOSAL
  Filled 2020-06-10: qty 15

## 2020-06-10 MED ORDER — ALPRAZOLAM 0.25 MG PO TABS
0.2500 mg | ORAL_TABLET | Freq: Two times a day (BID) | ORAL | Status: DC
  Administered 2020-06-10 – 2020-06-13 (×7): 0.25 mg via ORAL
  Filled 2020-06-10 (×7): qty 1

## 2020-06-10 MED ORDER — FOLIC ACID 1 MG PO TABS
1.0000 mg | ORAL_TABLET | Freq: Every day | ORAL | Status: DC
  Administered 2020-06-10 – 2020-06-13 (×4): 1 mg via ORAL
  Filled 2020-06-10 (×4): qty 1

## 2020-06-10 MED ORDER — HEPARIN SODIUM (PORCINE) 5000 UNIT/ML IJ SOLN
5000.0000 [IU] | Freq: Three times a day (TID) | INTRAMUSCULAR | Status: DC
  Administered 2020-06-10 – 2020-06-12 (×8): 5000 [IU] via SUBCUTANEOUS
  Filled 2020-06-10 (×8): qty 1

## 2020-06-10 MED ORDER — SODIUM CHLORIDE 0.9 % IV SOLN: 250.0000 mL | INTRAVENOUS | Status: DC | PRN

## 2020-06-10 MED ORDER — ASCORBIC ACID 500 MG PO TABS
250.0000 mg | ORAL_TABLET | Freq: Every day | ORAL | Status: DC
  Administered 2020-06-10 – 2020-06-13 (×4): 250 mg via ORAL
  Filled 2020-06-10 (×4): qty 1

## 2020-06-10 MED ORDER — ADULT MULTIVITAMIN W/MINERALS CH
1.0000 | ORAL_TABLET | Freq: Every day | ORAL | Status: DC
  Administered 2020-06-10 – 2020-06-12 (×2): 1 via ORAL
  Filled 2020-06-10 (×3): qty 1

## 2020-06-10 MED ORDER — ALBUTEROL SULFATE (2.5 MG/3ML) 0.083% IN NEBU
2.5000 mg | INHALATION_SOLUTION | RESPIRATORY_TRACT | Status: DC | PRN
  Administered 2020-06-10: 2.5 mg via RESPIRATORY_TRACT
  Filled 2020-06-10: qty 3

## 2020-06-10 MED ORDER — ONDANSETRON HCL 4 MG/2ML IJ SOLN: 4.0000 mg | Freq: Four times a day (QID) | INTRAMUSCULAR | Status: DC | PRN

## 2020-06-10 MED ORDER — METHYLPREDNISOLONE SODIUM SUCC 40 MG IJ SOLR
40.0000 mg | Freq: Four times a day (QID) | INTRAMUSCULAR | Status: AC
  Administered 2020-06-10 – 2020-06-12 (×8): 40 mg via INTRAVENOUS
  Filled 2020-06-10 (×7): qty 1

## 2020-06-10 MED ORDER — SODIUM CHLORIDE 0.9% FLUSH
3.0000 mL | Freq: Two times a day (BID) | INTRAVENOUS | Status: DC
  Administered 2020-06-10 – 2020-06-13 (×4): 3 mL via INTRAVENOUS

## 2020-06-10 MED ORDER — ALBUTEROL SULFATE HFA 108 (90 BASE) MCG/ACT IN AERS: 2.0000 | INHALATION_SPRAY | RESPIRATORY_TRACT | Status: DC | PRN

## 2020-06-10 MED ORDER — ACETAMINOPHEN 650 MG RE SUPP: 650.0000 mg | Freq: Four times a day (QID) | RECTAL | Status: DC | PRN

## 2020-06-10 MED ORDER — INSULIN ASPART 100 UNIT/ML ~~LOC~~ SOLN
0.0000 [IU] | Freq: Every day | SUBCUTANEOUS | Status: DC
  Administered 2020-06-10: 2 [IU] via SUBCUTANEOUS
  Administered 2020-06-11: 3 [IU] via SUBCUTANEOUS
  Administered 2020-06-12: 5 [IU] via SUBCUTANEOUS

## 2020-06-10 MED ORDER — IPRATROPIUM-ALBUTEROL 0.5-2.5 (3) MG/3ML IN SOLN
3.0000 mL | Freq: Three times a day (TID) | RESPIRATORY_TRACT | Status: DC
  Administered 2020-06-10 – 2020-06-13 (×8): 3 mL via RESPIRATORY_TRACT
  Filled 2020-06-10 (×8): qty 3

## 2020-06-10 MED ORDER — TAMSULOSIN HCL 0.4 MG PO CAPS
0.4000 mg | ORAL_CAPSULE | Freq: Every day | ORAL | Status: DC
  Administered 2020-06-10 – 2020-06-13 (×4): 0.4 mg via ORAL
  Filled 2020-06-10 (×4): qty 1

## 2020-06-10 MED ORDER — MOMETASONE FURO-FORMOTEROL FUM 200-5 MCG/ACT IN AERO
2.0000 | INHALATION_SPRAY | Freq: Two times a day (BID) | RESPIRATORY_TRACT | Status: DC
  Administered 2020-06-10 – 2020-06-13 (×7): 2 via RESPIRATORY_TRACT
  Filled 2020-06-10: qty 8.8

## 2020-06-10 MED ORDER — BISACODYL 10 MG RE SUPP: 10.0000 mg | Freq: Every day | RECTAL | Status: DC | PRN

## 2020-06-10 MED ORDER — THIAMINE HCL 100 MG PO TABS
100.0000 mg | ORAL_TABLET | Freq: Every day | ORAL | Status: DC
  Administered 2020-06-10 – 2020-06-13 (×4): 100 mg via ORAL
  Filled 2020-06-10 (×4): qty 1

## 2020-06-10 MED ORDER — CHLORHEXIDINE GLUCONATE CLOTH 2 % EX PADS
6.0000 | MEDICATED_PAD | Freq: Every day | CUTANEOUS | Status: DC
  Administered 2020-06-12: 6 via TOPICAL

## 2020-06-10 MED ORDER — PANTOPRAZOLE SODIUM 40 MG PO TBEC
40.0000 mg | DELAYED_RELEASE_TABLET | Freq: Every day | ORAL | Status: DC
  Administered 2020-06-10 – 2020-06-13 (×4): 40 mg via ORAL
  Filled 2020-06-10 (×4): qty 1

## 2020-06-10 MED ORDER — PREDNISONE 20 MG PO TABS
40.0000 mg | ORAL_TABLET | Freq: Every day | ORAL | Status: DC
  Administered 2020-06-12 – 2020-06-13 (×2): 40 mg via ORAL
  Filled 2020-06-10 (×2): qty 2

## 2020-06-10 MED ORDER — ONDANSETRON HCL 4 MG PO TABS: 4.0000 mg | ORAL_TABLET | Freq: Four times a day (QID) | ORAL | Status: DC | PRN

## 2020-06-10 MED ORDER — INSULIN ASPART 100 UNIT/ML ~~LOC~~ SOLN
0.0000 [IU] | Freq: Three times a day (TID) | SUBCUTANEOUS | Status: DC
  Administered 2020-06-11: 8 [IU] via SUBCUTANEOUS
  Administered 2020-06-11: 11 [IU] via SUBCUTANEOUS
  Administered 2020-06-11: 3 [IU] via SUBCUTANEOUS
  Administered 2020-06-12: 5 [IU] via SUBCUTANEOUS
  Administered 2020-06-12: 11 [IU] via SUBCUTANEOUS
  Administered 2020-06-12: 5 [IU] via SUBCUTANEOUS
  Administered 2020-06-13: 3 [IU] via SUBCUTANEOUS
  Administered 2020-06-13: 5 [IU] via SUBCUTANEOUS

## 2020-06-10 NOTE — Progress Notes (Signed)
Placed on BiPAP for HS ( patients request he does not normally wear)  FiO2 increased to 40 to return saturation to 92 ( patient was on 5 lpm [place]). Increased EPAP to 6 and  IPAP to 13. Patient appears to be tolerating well.

## 2020-06-10 NOTE — H&P (Signed)
Patient Demographics:    [name], is a 64 y.o. male  MRN: [id number]   DOB - Dec 02, 1955  Admit Date - 06/10/2020  Outpatient Primary MD for the patient is Asencion Noble, MD   Assessment & Plan:    Principal Problem:   COPD with acute exacerbation [facility]) Active Problems:   Acute on chronic respiratory failure with hypoxia ([place])   Non-alcoholic fatty liver disease   HTN (hypertension)   Type 2 diabetes mellitus without complication, without long-term current use of insulin ([name])   NASH (nonalcoholic steatohepatitis)   COPD GOLD III    1)Acute COPD Exacerbation-   treat empirically with IV Solu-Medrol 40 mg every 8 hours, give mucolytics, Rocephin and doxycycline, and bronchodilators as ordered, supplemental oxygen as ordered.  -Chest x-ray with possible right-sided pneumonia however this finding is unchanged from prior chest x-rays -Patient completed Covid vaccination back in July 2021  2) acute on chronic hypoxic respiratory failure--- secondary to #1 above -Baseline patient requires 2 to 3 L of oxygen at home PTA, -Currently requiring continuous BiPAP due to increased work of breathing and worsening hypoxia -Additional meds as above #1  3)DM2--glycemia with steroids, hold Jardiance, hold Amaryl, hold sitagliptin/Metformin Use Novolog/Humalog Sliding scale insulin with Accu-Cheks/Fingersticks as ordered   4)HTN--okay to restart Cardizem CD 240 mg daily  5) depression anxiety--- restart Zoloft,  And Xanax   6)HLD--okay to restart Lipitor  7)BPH--continue Flomax  8)NASH--- fatty liver disease, patient denies alcohol use  9) hyponatremia--- sodium is 132, avoid excessive free water,  10) chronic thrombocytopenia--platelets are 84K which is close to patient's based  Disposition/Need for in-Hospital  Stay- patient unable to be discharged at this time due to -acute on chronic hypoxic respiratory failure with increased work of breathing and worsening hypoxia requiring continuous BiPAP and supplemental oxygen, IV steroids and IV antibiotics  Status is: Inpatient  Remains inpatient appropriate because:acute on chronic hypoxic respiratory failure with increased work of breathing and worsening hypoxia requiring continuous BiPAP and supplemental oxygen, IV steroids and IV antibiotics   Dispo: The patient is from: Home              Anticipated d/c is to: Home              Anticipated d/c date is: 2 days              Patient currently is not medically stable to d/c. Barriers: Not Clinically Stable- acute on chronic hypoxic respiratory failure with increased work of breathing and worsening hypoxia requiring continuous BiPAP and supplemental oxygen, IV steroids and IV antibiotics  With History of - Reviewed by me  Past Medical History:  Diagnosis Date  . Anxiety   . Asthma   . Chest pain    a. 2003 Cath: nl cors.  [name] COPD (chronic obstructive pulmonary disease) ([place])   . Depression   . Diabetes mellitus   . Dyslipidemia   . Dysrhythmia   .  Fracture 12/15   lower back "L-1"  . GERD (gastroesophageal reflux disease)   . Hypertension   . PONV (postoperative nausea and vomiting)   . Splenomegaly       Past Surgical History:  Procedure Laterality Date  . BIOPSY  07/26/2016   Procedure: BIOPSY;  Surgeon: [name], MD;  [facility];  Service: Endoscopy;;  gastric  . CARDIAC CATHETERIZATION  10/22/2001   normal coronary arteriea (Dr. Domenic Moras)  . COLONOSCOPY  08/31/2011   [id number] rectal and colon polyps-treated. Single tubular adenoma. Next TCS 08/2016  . COLONOSCOPY N/A 07/26/2016   Dr. [name]: diverticulosis in sigmoid and descending colon. 6 mm benign splenic flexure. surveillance 5 yeras  . ESOPHAGOGASTRODUODENOSCOPY N/A 07/26/2016   Dr. [name]: normal esophagus,  portal gastropathy, chronic gastritis, normal duodenum, screening EGD 2 years   . ESOPHAGOGASTRODUODENOSCOPY (EGD) WITH ESOPHAGEAL DILATION N/A 05/15/2013   HFG:BMSXJD esophagus-status post Venia Minks dilation/Portal gastropathy. Antral erosions-status post biopsy. extrinsic compression along lesser curvature likely secondary to splenomegaly. gastric bx benign  . HERNIA REPAIR  03/2009  . POLYPECTOMY  07/26/2016   Procedure: POLYPECTOMY;  Surgeon: [name], MD;  Location: [facility];  Service: Endoscopy;;  colon  . TRANSTHORACIC ECHOCARDIOGRAM  2011   EF 55-20%, stage 1 diastolic dysfunction, trace TR & pulm valve regurg   . [place]    Chief Complaint  Patient presents with  . Shortness of Breath      HPI:    [name]  is a 64 y.o. male who is a reformed smoker (quit 10 yrs ago), with past medical history relevant for COPD Gold stage III, HTN, depression anxiety, NASH liver disease, HLD, diabetes and chronic hypoxic respiratory failure usually on 2 to 3 L of oxygen via nasal cannula at home who presents to the ED with increased work of breathing worsening hypoxia as per the EDP upon arrival patient required BiPAP right away -- -Unable to wean patient off of BiPAP due to persistent hypoxia and increased work of breathing at this time -Chest x-ray suggestive of right-sided pneumonia however chest x-ray is largely unchanged from recent chest x-ray -Patient denies fever or chills no chest pains no palpitations no dizziness no vomiting no diarrhea --Patient states that his cough is slightly more productive than usual, mostly clear to slightly yellow sputum -CBC is remarkable for platelets of 84 which is close to patient's baseline -Sodium is 132, creatinine 0.75 -Covid test is negative   Review of systems:    In addition to the HPI above,   A full Review of  Systems was done, all other systems reviewed are negative except as noted above in HPI , .    Social History:    Reviewed by me    Social History   Tobacco Use  . Smoking status: Former Smoker    Packs/day: 1.25    Years: 36.00    Pack years: 45.00    Types: Cigarettes    Quit date: 04/2011    Years since quitting: 9.1  . Smokeless tobacco: Former Network engineer Use Topics  . Alcohol use: No       Family History :  Reviewed by me    Family History  Problem Relation Age of Onset  . Heart disease Mother   . Cancer Mother        lymph nodes  . Diabetes Mother   . Arthritis Other   . Lung disease Other   . Asthma  Other   . Kidney disease Other   . Ovarian cancer Sister   . Diabetes Brother   . Heart disease Brother   . Hypertension Brother   . Diabetes Sister   . Diabetes Sister   . Cirrhosis Sister 67       no etoh  . Colon cancer Neg Hx      Home Medications:   Prior to Admission medications   Medication Sig Start Date End Date Taking? Authorizing Provider  acetaminophen (TYLENOL) 500 MG tablet Take 1,000 mg by mouth daily as needed for headache.     [provider]  albuterol (PROVENTIL) (2.5 MG/3ML) 0.083% nebulizer solution Take 3 mLs (2.5 mg total) by nebulization every 2 (two) hours as needed for wheezing. 05/25/20   Orson Eva, MD  ALPRAZolam [name]) 0.25 MG tablet Take 0.25 mg by mouth 2 (two) times daily.     [provider]  Ascorbic Acid (VITAMIN C) 100 MG tablet Take 100 mg by mouth daily.    [provider]  aspirin EC 81 MG tablet Take 81 mg by mouth at bedtime.     [provider]  atorvastatin (LIPITOR) 40 MG tablet Take 1 tablet (40 mg total) by mouth daily. Please make annual appt with Dr. Debara Pickett for refills. [phone]. 3rd& final attempt. 08/10/19   Hilty, [name], MD  azithromycin (ZITHROMAX) 500 MG tablet Take 1 tablet (500 mg total) by mouth daily. 05/25/20   Orson Eva, MD  budesonide-formoterol (SYMBICORT) 160-4.5 MCG/ACT inhaler Inhale 2 puffs into the lungs 2 (two) times daily.    [provider]   cefdinir (OMNICEF) 300 MG capsule Take 1 capsule (300 mg total) by mouth 2 (two) times daily. 05/25/20   Orson Eva, MD  diltiazem (CARDIZEM CD) 240 MG 24 hr capsule TAKE 1 CAPSULE BY MOUTH EVERY DAY 07/08/18   Hilty, [name], MD  gabapentin (NEURONTIN) 300 MG capsule Take 1 capsule by mouth at bedtime. 07/18/17   [provider]  glimepiride (AMARYL) 1 MG tablet Take 1 mg by mouth daily. 04/26/20   [provider]  JARDIANCE 25 MG TABS tablet Take 25 mg by mouth every evening.  01/01/17   [provider]  Multiple Vitamin (MULITIVITAMIN WITH MINERALS) TABS Take 1 tablet by mouth at bedtime.     [provider]  Jonetta Speak LANCETS 73S MISC  08/26/18   [provider]  [facility] VERIO test strip  08/26/18   [provider]  OXYGEN Inhale 4 L into the lungs. Pt uses oxygen at night and when exerting himself     [provider]  pantoprazole (PROTONIX) 40 MG tablet Take 1 tablet (40 mg total) by mouth daily before breakfast. 07/28/19   [name], PA-C  predniSONE (DELTASONE) 10 MG tablet Take 5 tablets (50 mg total) by mouth daily with breakfast. And decrease by one tablet daily 05/26/20   Tat, Shanon Brow, MD  PROAIR HFA 108 [phone] Base) MCG/ACT inhaler Inhale 2 puffs into the lungs every 4 (four) hours as needed for wheezing or shortness of breath.  08/29/15   [provider]  sertraline (ZOLOFT) 50 MG tablet Take 50 mg by mouth daily.    [provider]  SitaGLIPtin-MetFORMIN HCl (JANUMET XR) [phone] MG TB24 Take 1 tablet by mouth daily.    [provider]  tamsulosin (FLOMAX) 0.4 MG CAPS capsule Take 0.4 mg by mouth daily.    [provider]  Tiotropium Bromide Monohydrate (SPIRIVA RESPIMAT)  2.5 MCG/ACT AERS Inhale 2 puffs into the lungs daily. 02/03/20   Tanda Rockers, MD  VITAMIN D PO Take 1 tablet by mouth daily.    [provider]     Allergies:     Allergies  Allergen Reactions   . Baclofen     lethargic   . Simvastatin Nausea Only     Physical Exam:   Vitals  Blood pressure 138/67, pulse (!) 103, temperature (!) 96.4 F (35.8 C), temperature source Axillary, resp. rate (!) 26, height 5' 7"  (1.702 m), weight 70.8 kg, SpO2 94 %.  Physical Examination: General appearance - alert,, conversational dyspnea and increased work of breathing noted  mental status - alert, oriented to person, place, and time,  Eyes - sclera anicteric Mouth--BiPAP mask Neck - supple, no JVD elevation , Chest -diminished breath sounds, few scattered rhonchi on the right, few wheezes in upper lung fields, tachypnea noted Heart - S1 and S2 normal, regular ,  tachycardia noted Abdomen - soft, nontender, nondistended, no masses or organomegaly Neurological - screening mental status exam normal, neck supple without rigidity, cranial nerves II through XII intact, DTR's normal and symmetric Extremities - no pedal edema noted, intact peripheral pulses  Skin - warm, dry     Data Review:    CBC Recent Labs  Lab 06/10/20 0617  WBC 4.5  HGB 15.3  HCT 49.1  PLT 84*  MCV 81.8  MCH 25.5*  MCHC 31.2  RDW 16.1*  LYMPHSABS 0.5*  MONOABS 0.4  EOSABS 0.0  BASOSABS 0.0   ------------------------------------------------------------------------------------------------------------------  Chemistries  Recent Labs  Lab 06/10/20 0617  NA 132*  K 4.1  CL 97*  CO2 25  GLUCOSE 155*  BUN 14  CREATININE 0.75  CALCIUM 8.6*   ------------------------------------------------------------------------------------------------------------------ estimated creatinine clearance is 87.2 mL/min (by C-G formula based on SCr of 0.75 mg/dL). ------------------------------------------------------------------------------------------------------------------ No results for input(s): TSH, T4TOTAL, T3FREE, THYROIDAB in the last 72 hours.  Invalid input(s): FREET3   Coagulation profile No results for  input(s): INR, PROTIME in the last 168 hours. ------------------------------------------------------------------------------------------------------------------- No results for input(s): DDIMER in the last 72 hours. -------------------------------------------------------------------------------------------------------------------  Cardiac Enzymes No results for input(s): CKMB, TROPONINI, MYOGLOBIN in the last 168 hours.  Invalid input(s): CK ------------------------------------------------------------------------------------------------------------------    Component Value Date/Time   BNP 581.0 (H) 05/23/2020 1615     ---------------------------------------------------------------------------------------------------------------  Urinalysis    Component Value Date/Time   COLORURINE YELLOW 05/23/2020 1549   APPEARANCEUR CLEAR 05/23/2020 1549   LABSPEC 1.020 05/23/2020 1549   PHURINE 5.0 05/23/2020 1549   GLUCOSEU >=500 (A) 05/23/2020 1549   HGBUR NEGATIVE 05/23/2020 1549   BILIRUBINUR NEGATIVE 05/23/2020 1549   KETONESUR NEGATIVE 05/23/2020 1549   PROTEINUR NEGATIVE 05/23/2020 1549   NITRITE NEGATIVE 05/23/2020 1549   LEUKOCYTESUR NEGATIVE 05/23/2020 1549    ----------------------------------------------------------------------------------------------------------------   Imaging Results:    DG Chest Port 1 View  Result Date: 06/10/2020 CLINICAL DATA:  Dyspnea EXAM: PORTABLE CHEST 1 VIEW COMPARISON:  05/13/2020 FINDINGS: The lungs are symmetrically well expanded. There is new peripheral consolidation within the right lung base, likely infectious in the appropriate clinical setting. Benign calcified granuloma again noted within the a right upper lung zone. No pneumothorax or pleural effusion. Cardiac size within normal limits. The pulmonary vascularity is normal. No acute bone abnormality. IMPRESSION: Right basilar consolidation, likely infectious in the appropriate clinical  setting. Electronically Signed   By: Fidela Salisbury MD   On: 06/10/2020 06:43    Radiological Exams on Admission: DG  Chest Port 1 View  Result Date: 06/10/2020 CLINICAL DATA:  Dyspnea EXAM: PORTABLE CHEST 1 VIEW COMPARISON:  05/13/2020 FINDINGS: The lungs are symmetrically well expanded. There is new peripheral consolidation within the right lung base, likely infectious in the appropriate clinical setting. Benign calcified granuloma again noted within the a right upper lung zone. No pneumothorax or pleural effusion. Cardiac size within normal limits. The pulmonary vascularity is normal. No acute bone abnormality. IMPRESSION: Right basilar consolidation, likely infectious in the appropriate clinical setting. Electronically Signed   By: Fidela Salisbury MD   On: 06/10/2020 06:43    DVT Prophylaxis -SCD   AM Labs Ordered, also please review Full Orders  Family Communication: Admission, patients condition and plan of care including tests being ordered have been discussed with the patient  who indicate understanding and agree with the plan   Code Status - Full Code  Likely DC to home after improvement in respiratory status  Condition   stable  Roxan Hockey M.D on 06/10/2020 at 4:34 PM Go to [email or web address] -  for contact info  [facility] - Office  [phone]

## 2020-06-10 NOTE — Progress Notes (Signed)
RT transported patient to ICU on BIPAP, no complications noted. RN at bedside. RT will continue to monitor.

## 2020-06-10 NOTE — ED Provider Notes (Signed)
[facility] Provider Note   CSN: [id number] Arrival date & time: 06/10/20  0556     History Chief Complaint  Patient presents with  . Shortness of Breath  Level 5 caveat due to acuity of condition  [name] is a 64 y.o. male.  The history is provided by the patient.  Shortness of Breath Severity:  Severe Onset quality:  Gradual Duration:  1 day Timing:  Constant Progression:  Worsening Chronicity:  Recurrent Relieved by:  Nothing Worsened by:  Nothing Associated symptoms: cough    Patient history of COPD, diabetes presents with shortness of breath.  This is been ongoing since yesterday.  Patient arrives in respiratory distress and hypoxic on 2 L No other details known on arrival    Past Medical History:  Diagnosis Date  . Anxiety   . Asthma   . Chest pain    a. 2003 Cath: nl cors.  [name] COPD (chronic obstructive pulmonary disease) ([place])   . Depression   . Diabetes mellitus   . Dyslipidemia   . Dysrhythmia   . Fracture 12/15   lower back "L-1"  . GERD (gastroesophageal reflux disease)   . Hypertension   . PONV (postoperative nausea and vomiting)   . Splenomegaly     Patient Active Problem List   Diagnosis Date Noted  . Lobar pneumonia ([place]) 05/25/2020  . PNA (pneumonia) 05/23/2020  . Acute on chronic respiratory failure with hypoxia ([place]) 05/23/2020  . Chronic respiratory failure with hypoxia ([place]) 02/04/2020  . Upper airway cough syndrome 12/10/2019  . Hives 07/30/2018  . Full body hives 07/29/2018  . Idiopathic angioedema 07/29/2018  . COPD GOLD III 07/29/2018  . Mycobacterium avium infection ([place]) 12/18/2017  . Goiter 10/25/2017  . Suspected Mycobacterial infectious disease 10/25/2017  . COPD with acute exacerbation ([place]) 10/19/2017  . Dehydration 10/19/2017  . NASH (nonalcoholic steatohepatitis) 08/05/2017  . Liver fibrosis 01/31/2017  . Hx of adenomatous colonic polyps 07/05/2016  . Hyperproteinemia 09/05/2014  . HTN  (hypertension) 10/14/2013  . Dyslipidemia 10/14/2013  . Type 2 diabetes mellitus without complication, without long-term current use of insulin ([place]) 10/14/2013  . Leukopenia 08/08/2013  . Thrombocytopenia ([place]) 08/08/2013  . Lower abdominal pain 07/06/2013  . Abnormal LFTs 07/06/2013  . GERD (gastroesophageal reflux disease) 07/06/2013  . Unspecified constipation 07/06/2013  . Abdominal bloating 04/21/2013  . Abdominal pain, generalized 04/21/2013  . Non-alcoholic fatty liver disease 04/21/2013  . Splenomegaly 04/21/2013  . Lumbago 11/26/2011  . Muscle weakness (generalized) 11/26/2011  . Bilateral leg pain 11/06/2011  . Back pain with radiation 11/06/2011  . DDD (degenerative disc disease), lumbar 11/06/2011    Past Surgical History:  Procedure Laterality Date  . BIOPSY  07/26/2016   Procedure: BIOPSY;  Surgeon: [name], MD;  [facility]: [facility];  Service: Endoscopy;;  gastric  . CARDIAC CATHETERIZATION  10/22/2001   normal coronary arteriea (Dr. Domenic Moras)  . COLONOSCOPY  08/31/2011   EPP:IRJJOACZ rectal and colon polyps-treated. Single tubular adenoma. Next TCS 08/2016  . COLONOSCOPY N/A 07/26/2016   Dr. [name]: diverticulosis in sigmoid and descending colon. 6 mm benign splenic flexure. surveillance 5 yeras  . ESOPHAGOGASTRODUODENOSCOPY N/A 07/26/2016   Dr. [name]: normal esophagus, portal gastropathy, chronic gastritis, normal duodenum, screening EGD 2 years   . ESOPHAGOGASTRODUODENOSCOPY (EGD) WITH ESOPHAGEAL DILATION N/A 05/15/2013   YSA:YTKZSW esophagus-status post Venia Minks dilation/Portal gastropathy. Antral erosions-status post biopsy. extrinsic compression along lesser curvature likely secondary to splenomegaly. gastric bx benign  .  HERNIA REPAIR  03/2009  . POLYPECTOMY  07/26/2016   Procedure: POLYPECTOMY;  Surgeon: [name], MD;  Location: [facility];  Service: Endoscopy;;  colon  . TRANSTHORACIC ECHOCARDIOGRAM  2011   EF 58-52%, stage 1 diastolic  dysfunction, trace TR & pulm valve regurg   . VASECTOMY  1989       Family History  Problem Relation Age of Onset  . Heart disease Mother   . Cancer Mother        lymph nodes  . Diabetes Mother   . Arthritis Other   . Lung disease Other   . Asthma Other   . Kidney disease Other   . Ovarian cancer Sister   . Diabetes Brother   . Heart disease Brother   . Hypertension Brother   . Diabetes Sister   . Diabetes Sister   . Cirrhosis Sister 66       no etoh  . Colon cancer Neg Hx     Social History   Tobacco Use  . Smoking status: Former Smoker    Packs/day: 1.25    Years: 36.00    Pack years: 45.00    Types: Cigarettes    Quit date: 04/2011    Years since quitting: 9.1  . Smokeless tobacco: Former Network engineer  . Vaping Use: Never used  Substance Use Topics  . Alcohol use: No  . Drug use: No    Home Medications Prior to Admission medications   Medication Sig Start Date End Date Taking? Authorizing Provider  acetaminophen (TYLENOL) 500 MG tablet Take 1,000 mg by mouth daily as needed for headache.     [provider]  albuterol (PROVENTIL) (2.5 MG/3ML) 0.083% nebulizer solution Take 3 mLs (2.5 mg total) by nebulization every 2 (two) hours as needed for wheezing. 05/25/20   Orson Eva, MD  ALPRAZolam [name]) 0.25 MG tablet Take 0.25 mg by mouth 2 (two) times daily.     [provider]  Ascorbic Acid (VITAMIN C) 100 MG tablet Take 100 mg by mouth daily.    [provider]  aspirin EC 81 MG tablet Take 81 mg by mouth at bedtime.     [provider]  atorvastatin (LIPITOR) 40 MG tablet Take 1 tablet (40 mg total) by mouth daily. Please make annual appt with Dr. Debara Pickett for refills. [phone]. 3rd& final attempt. 08/10/19   Hilty, [name], MD  azithromycin (ZITHROMAX) 500 MG tablet Take 1 tablet (500 mg total) by mouth daily. 05/25/20   Orson Eva, MD  budesonide-formoterol (SYMBICORT) 160-4.5 MCG/ACT inhaler Inhale 2 puffs into the  lungs 2 (two) times daily.    [provider]  cefdinir (OMNICEF) 300 MG capsule Take 1 capsule (300 mg total) by mouth 2 (two) times daily. 05/25/20   Orson Eva, MD  diltiazem (CARDIZEM CD) 240 MG 24 hr capsule TAKE 1 CAPSULE BY MOUTH EVERY DAY 07/08/18   Hilty, [name], MD  gabapentin (NEURONTIN) 300 MG capsule Take 1 capsule by mouth at bedtime. 07/18/17   [provider]  glimepiride (AMARYL) 1 MG tablet Take 1 mg by mouth daily. 04/26/20   [provider]  JARDIANCE 25 MG TABS tablet Take 25 mg by mouth every evening.  01/01/17   [provider]  Multiple Vitamin (MULITIVITAMIN WITH MINERALS) TABS Take 1 tablet by mouth at bedtime.     [provider]  Jonetta Speak LANCETS 14E MISC  08/26/18   [provider]  [facility]  VERIO test strip  08/26/18   [provider]  OXYGEN Inhale 4 L into the lungs. Pt uses oxygen at night and when exerting himself     [provider]  pantoprazole (PROTONIX) 40 MG tablet Take 1 tablet (40 mg total) by mouth daily before breakfast. 07/28/19   [name], PA-C  predniSONE (DELTASONE) 10 MG tablet Take 5 tablets (50 mg total) by mouth daily with breakfast. And decrease by one tablet daily 05/26/20   Tat, Shanon Brow, MD  PROAIR HFA 108 [phone] Base) MCG/ACT inhaler Inhale 2 puffs into the lungs every 4 (four) hours as needed for wheezing or shortness of breath.  08/29/15   [provider]  sertraline (ZOLOFT) 50 MG tablet Take 50 mg by mouth daily.    [provider]  SitaGLIPtin-MetFORMIN HCl (JANUMET XR) [phone] MG TB24 Take 1 tablet by mouth daily.    [provider]  tamsulosin (FLOMAX) 0.4 MG CAPS capsule Take 0.4 mg by mouth daily.    [provider]  Tiotropium Bromide Monohydrate (SPIRIVA RESPIMAT) 2.5 MCG/ACT AERS Inhale 2 puffs into the lungs daily. 02/03/20   Tanda Rockers, MD  VITAMIN D PO Take 1 tablet by mouth daily.    [provider]     Allergies    Baclofen and Simvastatin  Review of Systems   Review of Systems  Unable to perform ROS: Acuity of condition  Respiratory: Positive for cough and shortness of breath.     Physical Exam Updated Vital Signs BP 139/65   Pulse (!) 114   Temp 99.5 F (37.5 C) (Oral)   Resp (!) 25   Ht 1.702 m (5' 7" )   Wt 75.3 kg   SpO2 97%   BMI 26.00 kg/m   Physical Exam CONSTITUTIONAL: Elderly, distress noted HEAD: Normocephalic/atraumatic EYES: EOMI/PERRL ENMT: Mucous membranes moist NECK: supple no meningeal signs SPINE/BACK:entire spine nontender CV: Tachycardic LUNGS: Tachypneic with tight sounding wheeze bilaterally, distress noted ABDOMEN: soft, nontender NEURO: Pt is awake/alert/appropriate, moves all extremitiesx4.  No facial droop.   EXTREMITIES: pulses normal/equal, full ROM SKIN: warm, color normal PSYCH: Anxious  ED Results / Procedures / Treatments   Labs (all labs ordered are listed, but only abnormal results are displayed) Labs Reviewed  RESP PANEL BY RT-PCR (FLU A&B, COVID) [id number]  BASIC METABOLIC PANEL  CBC WITH DIFFERENTIAL/PLATELET    EKG EKG Interpretation  Date/Time:  Friday June 10 2020 06:11:20 EST Ventricular Rate:  119 PR Interval:    QRS Duration: 86 QT Interval:  307 QTC Calculation: 432 R Axis:   91 Text Interpretation: Sinus tachycardia Ventricular premature complex Right axis deviation Nonspecific T abnormalities, inferior leads No significant change since last tracing Confirmed by Ripley Fraise ([id number]) on 06/10/2020 6:17:36 AM   Radiology DG Chest Port 1 View  Result Date: 06/10/2020 CLINICAL DATA:  Dyspnea EXAM: PORTABLE CHEST 1 VIEW COMPARISON:  05/13/2020 FINDINGS: The lungs are symmetrically well expanded. There is new peripheral consolidation within the right lung base, likely infectious in the appropriate clinical setting. Benign calcified granuloma again noted within the a right upper lung zone. No pneumothorax  or pleural effusion. Cardiac size within normal limits. The pulmonary vascularity is normal. No acute bone abnormality. IMPRESSION: Right basilar consolidation, likely infectious in the appropriate clinical setting. Electronically Signed   By: Fidela Salisbury MD   On: 06/10/2020 06:43    Procedures .Critical Care Performed by: Ripley Fraise, MD Authorized by: Ripley Fraise, MD   Critical care provider  statement:    Critical care time (minutes):  40   Critical care start time:  06/10/2020 6:40 AM   Critical care end time:  06/10/2020 7:20 AM   Critical care time was exclusive of:  Separately billable procedures and treating other patients   Critical care was necessary to treat or prevent imminent or life-threatening deterioration of the following conditions:  Respiratory failure   Critical care was time spent personally by me on the following activities:  Development of treatment plan with patient or surrogate, evaluation of patient's response to treatment, examination of patient, obtaining history from patient or surrogate, review of old charts, pulse oximetry, re-evaluation of patient's condition, ordering and review of radiographic studies, ordering and review of laboratory studies and ordering and performing treatments and interventions   I assumed direction of critical care for this patient from another provider in my specialty: no        Medications Ordered in ED Medications  methylPREDNISolone sodium succinate (SOLU-MEDROL) 125 mg/2 mL injection 125 mg (125 mg Intravenous Given 06/10/20 0633)  albuterol (PROVENTIL,VENTOLIN) solution continuous neb (10 mg/hr Nebulization Given 06/10/20 0639)  ipratropium (ATROVENT) nebulizer solution 0.5 mg (0.5 mg Nebulization Given 06/10/20 0630)    ED Course  I have reviewed the triage vital signs and the nursing notes.  Pertinent labs & imaging results that were available during my care of the patient were reviewed by me and considered in my  medical decision making (see chart for details).    MDM Rules/Calculators/A&P                          6:54 AM Patient seen on arrival for respiratory distress.  Patient was tachypneic and ill-appearing.  I immediately ordered noninvasive ventilation and patient is now improving on recheck. Patient was admitted last month for COPD exacerbation and pneumonia. Labs are pending at this time, patient will need to be admitted.  We will follow closely 7:39 AM Overall patient is improving on noninvasive ventilation.  He has been given nebulized therapy as well as steroids for COPD Patient is awaiting admission at this time. 7:46 AM D/w dr c. Denton Brick for admission to stepdown  Final Clinical Impression(s) / ED Diagnoses Final diagnoses:  COPD exacerbation ([place])  Acute respiratory failure with hypoxia [facility])    Rx / DC Orders ED Discharge Orders    None       Ripley Fraise, MD 06/10/20 [phone]

## 2020-06-10 NOTE — ED Triage Notes (Signed)
Pt arrives c/o increased SOB since yesterday. Pt seen recently for PNU, but states PCP told him he was doing good. Pt is 86% on 2L on arrival.

## 2020-06-11 LAB — CBC
HCT: 48.2 % (ref 39.0–52.0)
Hemoglobin: 14.9 g/dL (ref 13.0–17.0)
MCH: 25.3 pg — ABNORMAL LOW (ref 26.0–34.0)
MCHC: 30.9 g/dL (ref 30.0–36.0)
MCV: 81.8 fL (ref 80.0–100.0)
Platelets: 80 10*3/uL — ABNORMAL LOW (ref 150–400)
RBC: 5.89 MIL/uL — ABNORMAL HIGH (ref 4.22–5.81)
RDW: 15.6 % — ABNORMAL HIGH (ref 11.5–15.5)
WBC: 2.6 10*3/uL — ABNORMAL LOW (ref 4.0–10.5)
nRBC: 0 % (ref 0.0–0.2)

## 2020-06-11 LAB — BASIC METABOLIC PANEL
Anion gap: 11 (ref 5–15)
BUN: 24 mg/dL — ABNORMAL HIGH (ref 8–23)
CO2: 29 mmol/L (ref 22–32)
Calcium: 9.4 mg/dL (ref 8.9–10.3)
Chloride: 98 mmol/L (ref 98–111)
Creatinine, Ser: 0.82 mg/dL (ref 0.61–1.24)
GFR, Estimated: >60 mL/min (ref >60)
Glucose, Bld: 213 mg/dL — ABNORMAL HIGH (ref 70–99)
Potassium: 5.3 mmol/L — ABNORMAL HIGH (ref 3.5–5.1)
Sodium: 138 mmol/L (ref 135–145)

## 2020-06-11 LAB — GLUCOSE, CAPILLARY
Glucose-Capillary: 173 mg/dL — ABNORMAL HIGH (ref 70–99)
Glucose-Capillary: 289 mg/dL — ABNORMAL HIGH (ref 70–99)
Glucose-Capillary: 332 mg/dL — ABNORMAL HIGH (ref 70–99)
Glucose-Capillary: 291 mg/dL — ABNORMAL HIGH (ref 70–99)

## 2020-06-11 MED ORDER — SODIUM ZIRCONIUM CYCLOSILICATE 10 G PO PACK
10.0000 g | PACK | Freq: Once | ORAL | Status: AC
  Administered 2020-06-11: 10 g via ORAL
  Filled 2020-06-11: qty 1

## 2020-06-11 MED ORDER — DOXYCYCLINE HYCLATE 100 MG PO TABS
100.0000 mg | ORAL_TABLET | Freq: Two times a day (BID) | ORAL | Status: DC
  Administered 2020-06-11 – 2020-06-13 (×5): 100 mg via ORAL
  Filled 2020-06-11 (×7): qty 1

## 2020-06-11 NOTE — Progress Notes (Signed)
Patient Demographics:    [name], is a 64 y.o. male, DOB - 1956-06-23, GQQ:[id number]  Admit date - 06/10/2020   Admitting Physician Daylin Gruszka Denton Brick, MD  Outpatient Primary MD for the patient is Asencion Noble, MD  LOS - 1   Chief Complaint  Patient presents with  . Shortness of Breath        Subjective:    [name] today has no fevers, no emesis,  No chest pain,   -Cough and dyspnea is not worse -Hypoxia improving able to come off continuous BiPAP -Currently on 5 L of nasal cannula oxygen  Assessment  & Plan :    Principal Problem:   COPD with acute exacerbation ([name]) Active Problems:   Acute on chronic respiratory failure with hypoxia ([name])   HTN (hypertension)   Type 2 diabetes mellitus without complication, without long-term current use of insulin ([name])   NASH (nonalcoholic steatohepatitis)   COPD GOLD III   Brief Summary:- 64 y.o. male who is a reformed smoker (quit 10 yrs ago), with past medical history relevant for COPD Gold stage III, HTN, depression anxiety, NASH liver disease, HLD, diabetes and chronic hypoxic respiratory failure usually on 2 to 3 L of oxygen via nasal cannula at home admitted on June 10, 2020 with acute on chronic hypoxic respiratory failure secondary to COPD exacerbation and pneumonia required continuous BiPAP for prolonged period of time initially on admission   A/p 1)Acute COPD Exacerbation-   able to come off continuous BiPAP -Plan to use BiPAP nightly and as needed -Continue IV Solu-Medrol 40 mg every 8 hours, c/n  mucolytics, Rocephin and doxycycline, and bronchodilators as ordered, supplemental oxygen as ordered.  -Chest x-ray with possible right-sided pneumonia however this finding is unchanged from prior chest x-rays -Patient completed Covid vaccination back in July 2021  2)Acute on chronic hypoxic respiratory failure--- secondary to #1  above -Baseline patient requires 2 to 3 L of oxygen at home PTA, --Additional meds as above #1  3)DM2--anticipate worsening hyperglycemia with steroids, hold Jardiance, hold Amaryl, hold sitagliptin/Metformin Use Novolog/Humalog Sliding scale insulin with Accu-Cheks/Fingersticks as ordered   4)HTN--stable, continue Cardizem CD 240 mg daily  5) depression and anxiety---  stable, continue  Zoloft,  And Xanax   6)HLD--continue Lipitor  7)BPH--continue Flomax  8)NASH--- fatty liver disease, patient denies alcohol use  9) hyponatremia--- sodium is up to 138 from 132, avoid excessive free water,  10) chronic thrombocytopenia--platelets are 84K which is close to patient's based  11) hyperkalemia--potassium is 5.3, give Lokelma and repeat  Disposition/Need for in-Hospital Stay- patient unable to be discharged at this time due to -acute on chronic hypoxic respiratory failure with increased work of breathing and worsening hypoxia requiring supplemental oxygen, IV steroids and IV antibiotics  Status is: Inpatient  Remains inpatient appropriate because:acute on chronic hypoxic respiratory failure with increased work of breathing and worsening hypoxia requiring continuous BiPAP and supplemental oxygen, IV steroids and IV antibiotics   Dispo: The patient is from: Home  Anticipated d/c is to: Home  Anticipated d/c date is: 2 days  Patient currently is not medically stable to d/c. Barriers: Not Clinically Stable-acute on chronic hypoxic respiratory failure with increased work of breathing and worsening hypoxia requiring supplemental oxygen, IV steroids and  IV antibiotics  Code Status :  -  Code Status: Full Code   Family Communication:   (patient is alert, awake and coherent)    Consults  :  Na  DVT Prophylaxis  :   - SCDs  heparin injection 5,000 Units Start: 06/10/20 0815 SCDs Start: 06/10/20 0758 Place TED hose Start: 06/10/20  0758    Lab Results  Component Value Date   PLT 80 (L) 06/11/2020    Inpatient Medications  Scheduled Meds: . ALPRAZolam  0.25 mg Oral BID  . ascorbic acid  250 mg Oral Daily  . aspirin EC  81 mg Oral Q breakfast  . atorvastatin  40 mg Oral Daily  . Chlorhexidine Gluconate Cloth  6 each Topical Q0600  . cholecalciferol  1,000 Units Oral Daily  . diltiazem  240 mg Oral Daily  . doxycycline  100 mg Oral Q12H  . folic acid  1 mg Oral Daily  . gabapentin  300 mg Oral QHS  . guaiFENesin  600 mg Oral BID  . heparin  5,000 Units Subcutaneous Q8H  . insulin aspart  0-15 Units Subcutaneous TID WC  . insulin aspart  0-5 Units Subcutaneous QHS  . ipratropium-albuterol  3 mL Nebulization TID  . methylPREDNISolone (SOLU-MEDROL) injection  40 mg Intravenous Q6H   Followed by  . [START ON 06/12/2020] predniSONE  40 mg Oral Q breakfast  . mometasone-formoterol  2 puff Inhalation BID  . multivitamin with minerals  1 tablet Oral QHS  . pantoprazole  40 mg Oral QAC breakfast  . sertraline  50 mg Oral Daily  . sodium chloride flush  3 mL Intravenous Q12H  . sodium chloride flush  3 mL Intravenous Q12H  . tamsulosin  0.4 mg Oral Daily  . thiamine  100 mg Oral Daily  . umeclidinium bromide  1 puff Inhalation Daily   Continuous Infusions: . sodium chloride    . cefTRIAXone (ROCEPHIN)  IV Stopped (06/11/20 0829)   PRN Meds:.sodium chloride, acetaminophen **OR** acetaminophen, albuterol, albuterol, bisacodyl, ondansetron **OR** ondansetron (ZOFRAN) IV, polyethylene glycol, sodium chloride flush   Anti-infectives (From admission, onward)   Start     Dose/Rate Route Frequency Ordered Stop   06/11/20 1000  doxycycline (VIBRA-TABS) tablet 100 mg        100 mg Oral Every 12 hours 06/11/20 0751     06/10/20 0815  cefTRIAXone (ROCEPHIN) 1 g in sodium chloride 0.9 % 100 mL IVPB        1 g 200 mL/hr over 30 Minutes Intravenous Every 24 hours 06/10/20 0802 06/15/20 0759        Objective:    Vitals:   06/11/20 0800 06/11/20 0820 06/11/20 0900 06/11/20 1000  BP: (!) 111/58  124/73 (!) 123/59  Pulse: (!) 45  (!) 101 (!) 101  Resp: (!) 24  (!) 23 (!) 23  Temp:      TempSrc:      SpO2: 93% 93% 96% 92%  Weight:      Height:        Wt Readings from Last 3 Encounters:  06/10/20 70.8 kg  05/24/20 75.3 kg  05/23/20 75.2 kg     Intake/Output Summary (Last 24 hours) at 06/11/2020 1042 Last data filed at 06/11/2020 0900 Gross per 24 hour  Intake 922.78 ml  Output 1650 ml  Net -727.22 ml   Physical Exam  Gen:- Awake Alert, able to speak in short sentences HEENT:- [place].AT, No sclera icterus Nose- [place] 5L/min alternating with BiPAP mask  Neck-Supple Neck,No JVD,.  Lungs-diminished breath sounds, few scattered wheezes,  CV- S1, S2 normal, regular  Abd-  +ve B.Sounds, Abd Soft, No tenderness,    Extremity/Skin:- No  edema, pedal pulses present  Psych-affect is appropriate, oriented x3 Neuro-no new focal deficits, no tremors   Data Review:   Micro Results Recent Results (from the past 240 hour(s))  Resp Panel by RT-PCR (Flu A&B, Covid) Nasopharyngeal Swab     Status: None   Collection Time: 06/10/20  6:17 AM   Specimen: Nasopharyngeal Swab; Nasopharyngeal(NP) swabs in vial transport medium  Result Value Ref Range Status   SARS Coronavirus 2 by RT PCR NEGATIVE NEGATIVE Final    Comment: (NOTE) SARS-CoV-2 target nucleic acids are NOT DETECTED.  The SARS-CoV-2 RNA is generally detectable in upper respiratory specimens during the acute phase of infection. The lowest concentration of SARS-CoV-2 viral copies this assay can detect is 138 copies/mL. A negative result does not preclude SARS-Cov-2 infection and should not be used as the sole basis for treatment or other patient management decisions. A negative result may occur with  improper specimen collection/handling, submission of specimen other than nasopharyngeal swab, presence of viral mutation(s) within the areas  targeted by this assay, and inadequate number of viral copies(<138 copies/mL). A negative result must be combined with clinical observations, patient history, and epidemiological information. The expected result is Negative.  Fact Sheet for Patients:  EntrepreneurPulse.com.au  Fact Sheet for Healthcare Providers:  IncredibleEmployment.be  This test is no t yet approved or cleared by the [place] FDA and  has been authorized for detection and/or diagnosis of SARS-CoV-2 by FDA under an Emergency Use Authorization (EUA). This EUA will remain  in effect (meaning this test can be used) for the duration of the COVID-19 declaration under Section 564(b)(1) of the Act, 21 U.S.C.section 360bbb-3(b)(1), unless the authorization is terminated  or revoked sooner.       Influenza A by PCR NEGATIVE NEGATIVE Final   Influenza B by PCR NEGATIVE NEGATIVE Final    Comment: (NOTE) The Xpert Xpress SARS-CoV-2/FLU/RSV plus assay is intended as an aid in the diagnosis of influenza from Nasopharyngeal swab specimens and should not be used as a sole basis for treatment. Nasal washings and aspirates are unacceptable for Xpert Xpress SARS-CoV-2/FLU/RSV testing.  Fact Sheet for Patients: EntrepreneurPulse.com.au  Fact Sheet for Healthcare Providers: IncredibleEmployment.be  This test is not yet approved or cleared by the [place] FDA and has been authorized for detection and/or diagnosis of SARS-CoV-2 by FDA under an Emergency Use Authorization (EUA). This EUA will remain in effect (meaning this test can be used) for the duration of the COVID-19 declaration under Section 564(b)(1) of the Act, 21 U.S.C. section 360bbb-3(b)(1), unless the authorization is terminated or revoked.  Performed at [facility], [address] [place], [place] [address]   MRSA PCR Screening     Status: None   Collection Time: 06/10/20 11:26 AM    Specimen: Nasal Mucosa; Nasopharyngeal  Result Value Ref Range Status   MRSA by PCR NEGATIVE NEGATIVE Final    Comment:        The GeneXpert MRSA Assay (FDA approved for NASAL specimens only), is one component of a comprehensive MRSA colonization surveillance program. It is not intended to diagnose MRSA infection nor to guide or monitor treatment for MRSA infections. Performed at [facility], [address], [place], [place] [address]     Radiology Reports DG Chest [place] 1 View  Result Date: 06/10/2020 CLINICAL DATA:  Dyspnea EXAM: PORTABLE CHEST 1 VIEW COMPARISON:  05/13/2020 FINDINGS: The lungs are symmetrically well expanded. There is new peripheral consolidation within the right lung base, likely infectious in the appropriate clinical setting. Benign calcified granuloma again noted within the a right upper lung zone. No pneumothorax or pleural effusion. Cardiac size within normal limits. The pulmonary vascularity is normal. No acute bone abnormality. IMPRESSION: Right basilar consolidation, likely infectious in the appropriate clinical setting. Electronically Signed   By: Fidela Salisbury MD   On: 06/10/2020 06:43   DG Chest Portable 1 View  Result Date: 05/23/2020 CLINICAL DATA:  Cough EXAM: PORTABLE CHEST 1 VIEW COMPARISON:  April 17, 2020 FINDINGS: The heart size and mediastinal contours are unchanged with mild cardiomegaly. There is prominence of the central pulmonary vasculature. Mildly increased hazy airspace opacity seen at the periphery of the right lung base and streaky airspace atelectasis in the right mid lung. There is calcified granulomata seen within the right lung apex. No acute osseous abnormality. IMPRESSION: Hazy airspace opacity at the periphery of the right lung base which could be due to atelectasis and/or infectious etiology. Pulmonary vascular congestion. Electronically Signed   By: Prudencio Pair M.D.   On: 05/23/2020 16:17     CBC Recent Labs  Lab  06/10/20 0617 06/11/20 0420  WBC 4.5 2.6*  HGB 15.3 14.9  HCT 49.1 48.2  PLT 84* 80*  MCV 81.8 81.8  MCH 25.5* 25.3*  MCHC 31.2 30.9  RDW 16.1* 15.6*  LYMPHSABS 0.5*  --   MONOABS 0.4  --   EOSABS 0.0  --   BASOSABS 0.0  --     Chemistries  Recent Labs  Lab 06/10/20 0617 06/11/20 0420  NA 132* 138  K 4.1 5.3*  CL 97* 98  CO2 25 29  GLUCOSE 155* 213*  BUN 14 24*  CREATININE 0.75 0.82  CALCIUM 8.6* 9.4   ------------------------------------------------------------------------------------------------------------------ No results for input(s): CHOL, HDL, LDLCALC, TRIG, CHOLHDL, LDLDIRECT in the last 72 hours.  Lab Results  Component Value Date   HGBA1C 7.9 (H) 05/23/2020   ------------------------------------------------------------------------------------------------------------------ No results for input(s): TSH, T4TOTAL, T3FREE, THYROIDAB in the last 72 hours.  Invalid input(s): FREET3 ------------------------------------------------------------------------------------------------------------------ No results for input(s): VITAMINB12, FOLATE, FERRITIN, TIBC, IRON, RETICCTPCT in the last 72 hours.  Coagulation profile No results for input(s): INR, PROTIME in the last 168 hours.  No results for input(s): DDIMER in the last 72 hours.  Cardiac Enzymes No results for input(s): CKMB, TROPONINI, MYOGLOBIN in the last 168 hours.  Invalid input(s): CK ------------------------------------------------------------------------------------------------------------------    Component Value Date/Time   BNP 581.0 (H) 05/23/2020 1615     Roxan Hockey M.D on 06/11/2020 at 10:42 AM  Go to [email or web address] - for contact info  [facility] - Office  [phone]

## 2020-06-12 LAB — GLUCOSE, CAPILLARY
Glucose-Capillary: 204 mg/dL — ABNORMAL HIGH (ref 70–99)
Glucose-Capillary: 314 mg/dL — ABNORMAL HIGH (ref 70–99)
Glucose-Capillary: 225 mg/dL — ABNORMAL HIGH (ref 70–99)
Glucose-Capillary: 373 mg/dL — ABNORMAL HIGH (ref 70–99)

## 2020-06-12 LAB — BASIC METABOLIC PANEL
Anion gap: 9 (ref 5–15)
BUN: 28 mg/dL — ABNORMAL HIGH (ref 8–23)
CO2: 30 mmol/L (ref 22–32)
Calcium: 9 mg/dL (ref 8.9–10.3)
Chloride: 100 mmol/L (ref 98–111)
Creatinine, Ser: 0.66 mg/dL (ref 0.61–1.24)
GFR, Estimated: >60 mL/min (ref >60)
Glucose, Bld: 235 mg/dL — ABNORMAL HIGH (ref 70–99)
Potassium: 4.5 mmol/L (ref 3.5–5.1)
Sodium: 139 mmol/L (ref 135–145)

## 2020-06-12 NOTE — Progress Notes (Signed)
Patient Demographics:    [name], is a 64 y.o. male, DOB - 22-Jan-1956, BWG:[id number]  Admit date - 06/10/2020   Admitting Physician Christopher Pedone Denton Brick, MD  Outpatient Primary MD for the patient is Christopher Noble, MD  LOS - 2   Chief Complaint  Patient presents with  . Shortness of Breath        Subjective:    [name] today has no fevers, no emesis,  No chest pain,    -Cough and hypoxia persist -Currently on 4 to 5 L of oxygen.,  dyspnea on exertion is not worse  Assessment  & Plan :    Principal Problem:   COPD with acute exacerbation ([place]) Active Problems:   Acute on chronic respiratory failure with hypoxia ([name])   HTN (hypertension)   Type 2 diabetes mellitus without complication, without long-term current use of insulin ([name])   NASH (nonalcoholic steatohepatitis)   COPD GOLD III   Brief Summary:- 64 y.o. male who is a reformed smoker (quit 10 yrs ago), with past medical history relevant for COPD Gold stage III, HTN, depression anxiety, NASH liver disease, HLD, diabetes and chronic hypoxic respiratory failure usually on 2 to 3 L of oxygen via nasal cannula at home admitted on June 10, 2020 with acute on chronic hypoxic respiratory failure secondary to COPD exacerbation and pneumonia required continuous BiPAP for prolonged period of time initially on admission   A/p 1)Acute COPD Exacerbation-   able to come off continuous BiPAP -Continue BiPAP nightly and as needed -Continue IV Solu-Medrol 40 mg every 8 hours, c/n  mucolytics, Rocephin and doxycycline, and bronchodilators as ordered, supplemental oxygen as ordered.  -Chest x-ray with possible right-sided pneumonia however this finding is unchanged from prior chest x-rays -Patient completed Covid vaccination back in July 2021 06/12/20 -Clinically patient is not worse but oxygen requirement and work of breathing is still above  baseline  2)Acute on chronic hypoxic respiratory failure--- secondary to #1 above -Baseline patient requires 2 to 3 L of oxygen at home PTA, --Additional meds as above #1  3)DM2--anticipate worsening hyperglycemia with steroids, hold Jardiance, hold Amaryl, hold sitagliptin/Metformin Use Novolog/Humalog Sliding scale insulin with Accu-Cheks/Fingersticks as ordered   4)HTN--stable, continue Cardizem CD 240 mg daily  5) depression and anxiety---  stable, continue  Zoloft,  And Xanax   6)HLD--continue Lipitor  7)BPH--continue Flomax  8)NASH--- fatty liver disease, patient denies alcohol use  9) hyponatremia--- sodium is up to 138 from 132, avoid excessive free water,  10) chronic thrombocytopenia--platelets are 84K which is close to patient's based  11) hyperkalemia--potassium is 5.3, give Lokelma and repeat  Disposition/Need for in-Hospital Stay- patient unable to be discharged at this time due to -acute on chronic hypoxic respiratory failure with increased work of breathing and worsening hypoxia requiring supplemental oxygen, IV steroids and IV antibiotics  Status is: Inpatient  Remains inpatient appropriate because:acute on chronic hypoxic respiratory failure with increased work of breathing and worsening hypoxia requiring continuous BiPAP and supplemental oxygen, IV steroids and IV antibiotics   Dispo: The patient is from: Home  Anticipated d/c is to: Home  Anticipated d/c date is: 1 days  Patient currently is not medically stable to d/c. Barriers: Not Clinically Stable-acute on chronic hypoxic respiratory failure with  increased work of breathing and worsening hypoxia requiring supplemental oxygen, IV steroids and IV antibiotics  Code Status :  -  Code Status: Full Code   Family Communication:   (patient is alert, awake and coherent)   Discussed with his wife Consults  :  Na  DVT Prophylaxis  :   - SCDs  heparin injection  5,000 Units Start: 06/10/20 0815 SCDs Start: 06/10/20 0758 Place TED hose Start: 06/10/20 0758    Lab Results  Component Value Date   PLT 80 (L) 06/11/2020    Inpatient Medications  Scheduled Meds: . ALPRAZolam  0.25 mg Oral BID  . ascorbic acid  250 mg Oral Daily  . aspirin EC  81 mg Oral Q breakfast  . atorvastatin  40 mg Oral Daily  . Chlorhexidine Gluconate Cloth  6 each Topical Q0600  . cholecalciferol  1,000 Units Oral Daily  . diltiazem  240 mg Oral Daily  . doxycycline  100 mg Oral Q12H  . folic acid  1 mg Oral Daily  . gabapentin  300 mg Oral QHS  . guaiFENesin  600 mg Oral BID  . heparin  5,000 Units Subcutaneous Q8H  . insulin aspart  0-15 Units Subcutaneous TID WC  . insulin aspart  0-5 Units Subcutaneous QHS  . ipratropium-albuterol  3 mL Nebulization TID  . mometasone-formoterol  2 puff Inhalation BID  . multivitamin with minerals  1 tablet Oral QHS  . pantoprazole  40 mg Oral QAC breakfast  . predniSONE  40 mg Oral Q breakfast  . sertraline  50 mg Oral Daily  . sodium chloride flush  3 mL Intravenous Q12H  . sodium chloride flush  3 mL Intravenous Q12H  . tamsulosin  0.4 mg Oral Daily  . thiamine  100 mg Oral Daily  . umeclidinium bromide  1 puff Inhalation Daily   Continuous Infusions: . sodium chloride    . cefTRIAXone (ROCEPHIN)  IV 1 g (06/12/20 1029)   PRN Meds:.sodium chloride, acetaminophen **OR** acetaminophen, albuterol, albuterol, bisacodyl, ondansetron **OR** ondansetron (ZOFRAN) IV, polyethylene glycol, sodium chloride flush   Anti-infectives (From admission, onward)   Start     Dose/Rate Route Frequency Ordered Stop   06/11/20 1000  doxycycline (VIBRA-TABS) tablet 100 mg        100 mg Oral Every 12 hours 06/11/20 0751     06/10/20 0815  cefTRIAXone (ROCEPHIN) 1 g in sodium chloride 0.9 % 100 mL IVPB        1 g 200 mL/hr over 30 Minutes Intravenous Every 24 hours 06/10/20 0802 06/15/20 0759        Objective:   Vitals:   06/12/20  0814 06/12/20 1336 06/12/20 1400 06/12/20 1830  BP:   111/77 112/63  Pulse:   88 78  Resp:   (!) 28 (!) 21  Temp:   97.9 F (36.6 C) 97.8 F (36.6 C)  TempSrc:    Oral  SpO2: 98% 96% 94% 95%  Weight:      Height:        Wt Readings from Last 3 Encounters:  06/10/20 70.8 kg  05/24/20 75.3 kg  05/23/20 75.2 kg    No intake or output data in the 24 hours ending 06/12/20 1928 Physical Exam  Gen:- Awake Alert, able to speak in short sentences HEENT:- [place].AT, No sclera icterus Nose-  5L/min  Neck-Supple Neck,No JVD,.  Lungs-diminished breath sounds, few scattered wheezes,  CV- S1, S2 normal, regular  Abd-  +ve B.Sounds, Abd Soft, No  tenderness,    Extremity/Skin:- No  edema, pedal pulses present  Psych-affect is appropriate, oriented x3 Neuro-no new focal deficits, no tremors   Data Review:   Micro Results Recent Results (from the past 240 hour(s))  Resp Panel by RT-PCR (Flu A&B, Covid) Nasopharyngeal Swab     Status: None   Collection Time: 06/10/20  6:17 AM   Specimen: Nasopharyngeal Swab; Nasopharyngeal(NP) swabs in vial transport medium  Result Value Ref Range Status   SARS Coronavirus 2 by RT PCR NEGATIVE NEGATIVE Final    Comment: (NOTE) SARS-CoV-2 target nucleic acids are NOT DETECTED.  The SARS-CoV-2 RNA is generally detectable in upper respiratory specimens during the acute phase of infection. The lowest concentration of SARS-CoV-2 viral copies this assay can detect is 138 copies/mL. A negative result does not preclude SARS-Cov-2 infection and should not be used as the sole basis for treatment or other patient management decisions. A negative result may occur with  improper specimen collection/handling, submission of specimen other than nasopharyngeal swab, presence of viral mutation(s) within the areas targeted by this assay, and inadequate number of viral copies(<138 copies/mL). A negative result must be combined with clinical observations, patient  history, and epidemiological information. The expected result is Negative.  Fact Sheet for Patients:  EntrepreneurPulse.com.au  Fact Sheet for Healthcare Providers:  IncredibleEmployment.be  This test is no t yet approved or cleared by the [place] FDA and  has been authorized for detection and/or diagnosis of SARS-CoV-2 by FDA under an Emergency Use Authorization (EUA). This EUA will remain  in effect (meaning this test can be used) for the duration of the COVID-19 declaration under Section 564(b)(1) of the Act, 21 U.S.C.section 360bbb-3(b)(1), unless the authorization is terminated  or revoked sooner.       Influenza A by PCR NEGATIVE NEGATIVE Final   Influenza B by PCR NEGATIVE NEGATIVE Final    Comment: (NOTE) The Xpert Xpress SARS-CoV-2/FLU/RSV plus assay is intended as an aid in the diagnosis of influenza from Nasopharyngeal swab specimens and should not be used as a sole basis for treatment. Nasal washings and aspirates are unacceptable for Xpert Xpress SARS-CoV-2/FLU/RSV testing.  Fact Sheet for Patients: EntrepreneurPulse.com.au  Fact Sheet for Healthcare Providers: IncredibleEmployment.be  This test is not yet approved or cleared by the [place] FDA and has been authorized for detection and/or diagnosis of SARS-CoV-2 by FDA under an Emergency Use Authorization (EUA). This EUA will remain in effect (meaning this test can be used) for the duration of the COVID-19 declaration under Section 564(b)(1) of the Act, 21 U.S.C. section 360bbb-3(b)(1), unless the authorization is terminated or revoked.  Performed at [facility], [address] [place], [place] [address]   MRSA PCR Screening     Status: None   Collection Time: 06/10/20 11:26 AM   Specimen: Nasal Mucosa; Nasopharyngeal  Result Value Ref Range Status   MRSA by PCR NEGATIVE NEGATIVE Final    Comment:        The GeneXpert MRSA  Assay (FDA approved for NASAL specimens only), is one component of a comprehensive MRSA colonization surveillance program. It is not intended to diagnose MRSA infection nor to guide or monitor treatment for MRSA infections. Performed at [facility], [address], [place], [place] [address]     Radiology Reports DG Chest [place] 1 View  Result Date: 06/10/2020 CLINICAL DATA:  Dyspnea EXAM: PORTABLE CHEST 1 VIEW COMPARISON:  05/13/2020 FINDINGS: The lungs are symmetrically well expanded. There is new peripheral consolidation within the  right lung base, likely infectious in the appropriate clinical setting. Benign calcified granuloma again noted within the a right upper lung zone. No pneumothorax or pleural effusion. Cardiac size within normal limits. The pulmonary vascularity is normal. No acute bone abnormality. IMPRESSION: Right basilar consolidation, likely infectious in the appropriate clinical setting. Electronically Signed   By: Fidela Salisbury MD   On: 06/10/2020 06:43   DG Chest Portable 1 View  Result Date: 05/23/2020 CLINICAL DATA:  Cough EXAM: PORTABLE CHEST 1 VIEW COMPARISON:  April 17, 2020 FINDINGS: The heart size and mediastinal contours are unchanged with mild cardiomegaly. There is prominence of the central pulmonary vasculature. Mildly increased hazy airspace opacity seen at the periphery of the right lung base and streaky airspace atelectasis in the right mid lung. There is calcified granulomata seen within the right lung apex. No acute osseous abnormality. IMPRESSION: Hazy airspace opacity at the periphery of the right lung base which could be due to atelectasis and/or infectious etiology. Pulmonary vascular congestion. Electronically Signed   By: Prudencio Pair M.D.   On: 05/23/2020 16:17     CBC Recent Labs  Lab 06/10/20 0617 06/11/20 0420  WBC 4.5 2.6*  HGB 15.3 14.9  HCT 49.1 48.2  PLT 84* 80*  MCV 81.8 81.8  MCH 25.5* 25.3*  MCHC 31.2 30.9  RDW 16.1* 15.6*   LYMPHSABS 0.5*  --   MONOABS 0.4  --   EOSABS 0.0  --   BASOSABS 0.0  --     Chemistries  Recent Labs  Lab 06/10/20 0617 06/11/20 0420 06/12/20 0635  NA 132* 138 139  K 4.1 5.3* 4.5  CL 97* 98 100  CO2 25 29 30   GLUCOSE 155* 213* 235*  BUN 14 24* 28*  CREATININE 0.75 0.82 0.66  CALCIUM 8.6* 9.4 9.0   ------------------------------------------------------------------------------------------------------------------ No results for input(s): CHOL, HDL, LDLCALC, TRIG, CHOLHDL, LDLDIRECT in the last 72 hours.  Lab Results  Component Value Date   HGBA1C 7.9 (H) 05/23/2020   ------------------------------------------------------------------------------------------------------------------ No results for input(s): TSH, T4TOTAL, T3FREE, THYROIDAB in the last 72 hours.  Invalid input(s): FREET3 ------------------------------------------------------------------------------------------------------------------ No results for input(s): VITAMINB12, FOLATE, FERRITIN, TIBC, IRON, RETICCTPCT in the last 72 hours.  Coagulation profile No results for input(s): INR, PROTIME in the last 168 hours.  No results for input(s): DDIMER in the last 72 hours.  Cardiac Enzymes No results for input(s): CKMB, TROPONINI, MYOGLOBIN in the last 168 hours.  Invalid input(s): CK ------------------------------------------------------------------------------------------------------------------    Component Value Date/Time   BNP 581.0 (H) 05/23/2020 1615     Roxan Hockey M.D on 06/12/2020 at 7:28 PM  Go to [email or web address] - for contact info  [facility] - Office  [phone]

## 2020-06-12 NOTE — TOC Initial Note (Signed)
Transition of Care [facility]) - Initial/Assessment Note    Patient Details  Name: [name] MRN: [id number] Date of Birth: 26-Nov-1955  Transition of Care [facility]) CM/SW Contact:    Natasha Bence, LCSW Phone Number: 06/12/2020, 4:30 PM  Clinical Narrative:                 Patient is a 64 year old male admitted for COPD exacerbation. Patient identified with high readmision risk score. CSW conducted assesment for patient. Patient's family reported that patient current utilizes a walker wheelchair, O2 and a cane in the home. Patient's wife reported that they are declining [facility] or SNF in the event that it would be recommended due to sufficient family support. TOC to follow.  Expected Discharge Plan: Home/Self Care Barriers to Discharge: Continued Medical Work up   Patient Goals and CMS Choice     Choice offered to / list presented to : Patient  Expected Discharge Plan and Services Expected Discharge Plan: Home/Self Care       Living arrangements for the past 2 months: Single Family Home                                      Prior Living Arrangements/Services Living arrangements for the past 2 months: Single Family Home Lives with:: Self, Spouse Patient language and need for interpreter reviewed:: Yes        Need for Family Participation in Patient Care: Yes (Comment) Care giver support system in place?: Yes (comment) Current home services: DME Criminal Activity/Legal Involvement Pertinent to Current Situation/Hospitalization: No - Comment as needed  Activities of Daily Living Home Assistive Devices/Equipment: Cane (specify quad or straight) ADL Screening (condition at time of admission) Patient's cognitive ability adequate to safely complete daily activities?: Yes Is the patient deaf or have difficulty hearing?: No Does the patient have difficulty seeing, even when wearing glasses/contacts?: No Does the patient have difficulty concentrating, remembering, or making  decisions?: No Patient able to express need for assistance with ADLs?: Yes Does the patient have difficulty dressing or bathing?: No Independently performs ADLs?: Yes (appropriate for developmental age) Does the patient have difficulty walking or climbing stairs?: No Weakness of Legs: None Weakness of Arms/Hands: None  Permission Sought/Granted                  Emotional Assessment         Alcohol / Substance Use: Not Applicable Psych Involvement: No (comment)  Admission diagnosis:  COPD exacerbation ([place]) [J44.1] Acute respiratory failure with hypoxia ([place]) [J96.01] COPD with acute exacerbation ([place]) [J44.1] Patient Active Problem List   Diagnosis Date Noted  . Lobar pneumonia ([place]) 05/25/2020  . PNA (pneumonia) 05/23/2020  . Acute on chronic respiratory failure with hypoxia ([place]) 05/23/2020  . Chronic respiratory failure with hypoxia ([place]) 02/04/2020  . Upper airway cough syndrome 12/10/2019  . Hives 07/30/2018  . Full body hives 07/29/2018  . Idiopathic angioedema 07/29/2018  . COPD GOLD III 07/29/2018  . Mycobacterium avium infection ([place]) 12/18/2017  . Goiter 10/25/2017  . Suspected Mycobacterial infectious disease 10/25/2017  . COPD with acute exacerbation ([place]) 10/19/2017  . Dehydration 10/19/2017  . NASH (nonalcoholic steatohepatitis) 08/05/2017  . Liver fibrosis 01/31/2017  . Hx of adenomatous colonic polyps 07/05/2016  . Hyperproteinemia 09/05/2014  . HTN (hypertension) 10/14/2013  . Dyslipidemia 10/14/2013  . Type 2 diabetes mellitus without complication, without long-term current use of  insulin ([place]) 10/14/2013  . Leukopenia 08/08/2013  . Thrombocytopenia ([place]) 08/08/2013  . Lower abdominal pain 07/06/2013  . Abnormal LFTs 07/06/2013  . GERD (gastroesophageal reflux disease) 07/06/2013  . Unspecified constipation 07/06/2013  . Abdominal bloating 04/21/2013  . Abdominal pain, generalized 04/21/2013  . Non-alcoholic fatty liver disease 04/21/2013   . Splenomegaly 04/21/2013  . Lumbago 11/26/2011  . Muscle weakness (generalized) 11/26/2011  . Bilateral leg pain 11/06/2011  . Back pain with radiation 11/06/2011  . DDD (degenerative disc disease), lumbar 11/06/2011   PCP:  Asencion Noble, MD Pharmacy[facility]   [id number] [place] [id number]: [phone]: [phone]    Social Determinants of Health (SDOH) Interventions    Readmission Risk Interventions Readmission Risk Prevention Plan 06/12/2020  Transportation Screening Complete  [place] or [place]  Social Work Consult for RAirport DrivePlanning/Counseling Complete  Palliative Care Screening Not Applicable  Medication Review (Press photographer Complete  Some recent data might be hidden

## 2020-06-12 NOTE — TOC Initial Note (Deleted)
Transition of Care [facility]) - Initial/Assessment Note    Patient Details  Name: [name] MRN: [id number] Date of Birth: 03/19/1956  Transition of Care [facility]) CM/SW Contact:    Natasha Bence, LCSW Phone Number: 06/12/2020, 4:09 PM  Clinical Narrative:                 Patient is a 64 year old male admitted for COPD exacerbation. Patient identified with high readmision risk score. CSW conducted assesment for patient. Patient's family reported that patient current utilizes a walker wheelchair, O2 and a cane in the home. Patient's wife reported that they are declining [facility] or SNF in the event that it would be recommended due to sufficient family support. TOC to follow.  Expected Discharge Plan: Home/Self Care Barriers to Discharge: Continued Medical Work up   Patient Goals and CMS Choice     Choice offered to / list presented to : Patient  Expected Discharge Plan and Services Expected Discharge Plan: Home/Self Care       Living arrangements for the past 2 months: Single Family Home                                      Prior Living Arrangements/Services Living arrangements for the past 2 months: Single Family Home Lives with:: Self, Spouse Patient language and need for interpreter reviewed:: Yes        Need for Family Participation in Patient Care: Yes (Comment) Care giver support system in place?: Yes (comment) Current home services: DME Criminal Activity/Legal Involvement Pertinent to Current Situation/Hospitalization: No - Comment as needed  Activities of Daily Living Home Assistive Devices/Equipment: Cane (specify quad or straight) ADL Screening (condition at time of admission) Patient's cognitive ability adequate to safely complete daily activities?: Yes Is the patient deaf or have difficulty hearing?: No Does the patient have difficulty seeing, even when wearing glasses/contacts?: No Does the patient have difficulty concentrating, remembering, or making  decisions?: No Patient able to express need for assistance with ADLs?: Yes Does the patient have difficulty dressing or bathing?: No Independently performs ADLs?: Yes (appropriate for developmental age) Does the patient have difficulty walking or climbing stairs?: No Weakness of Legs: None Weakness of Arms/Hands: None  Permission Sought/Granted                  Emotional Assessment         Alcohol / Substance Use: Not Applicable Psych Involvement: No (comment)  Admission diagnosis:  COPD exacerbation ([place]) [J44.1] Acute respiratory failure with hypoxia ([place]) [J96.01] COPD with acute exacerbation ([place]) [J44.1] Patient Active Problem List   Diagnosis Date Noted  . Lobar pneumonia ([place]) 05/25/2020  . PNA (pneumonia) 05/23/2020  . Acute on chronic respiratory failure with hypoxia ([place]) 05/23/2020  . Chronic respiratory failure with hypoxia ([place]) 02/04/2020  . Upper airway cough syndrome 12/10/2019  . Hives 07/30/2018  . Full body hives 07/29/2018  . Idiopathic angioedema 07/29/2018  . COPD GOLD III 07/29/2018  . Mycobacterium avium infection ([place]) 12/18/2017  . Goiter 10/25/2017  . Suspected Mycobacterial infectious disease 10/25/2017  . COPD with acute exacerbation ([place]) 10/19/2017  . Dehydration 10/19/2017  . NASH (nonalcoholic steatohepatitis) 08/05/2017  . Liver fibrosis 01/31/2017  . Hx of adenomatous colonic polyps 07/05/2016  . Hyperproteinemia 09/05/2014  . HTN (hypertension) 10/14/2013  . Dyslipidemia 10/14/2013  . Type 2 diabetes mellitus without complication, without long-term current use of  insulin ([place]) 10/14/2013  . Leukopenia 08/08/2013  . Thrombocytopenia ([place]) 08/08/2013  . Lower abdominal pain 07/06/2013  . Abnormal LFTs 07/06/2013  . GERD (gastroesophageal reflux disease) 07/06/2013  . Unspecified constipation 07/06/2013  . Abdominal bloating 04/21/2013  . Abdominal pain, generalized 04/21/2013  . Non-alcoholic fatty liver disease 04/21/2013   . Splenomegaly 04/21/2013  . Lumbago 11/26/2011  . Muscle weakness (generalized) 11/26/2011  . Bilateral leg pain 11/06/2011  . Back pain with radiation 11/06/2011  . DDD (degenerative disc disease), lumbar 11/06/2011   PCP:  Asencion Noble, MD Pharmacy:   [id number] [place] [id number]: [phone]: [phone]    Social Determinants of Health (SDOH) Interventions    Readmission Risk Interventions No flowsheet data found.

## 2020-06-13 ENCOUNTER — Telehealth: Payer: Self-pay | Admitting: Internal Medicine

## 2020-06-13 LAB — GLUCOSE, CAPILLARY
Glucose-Capillary: 151 mg/dL — ABNORMAL HIGH (ref 70–99)
Glucose-Capillary: 236 mg/dL — ABNORMAL HIGH (ref 70–99)

## 2020-06-13 MED ORDER — DILTIAZEM HCL ER COATED BEADS 240 MG PO CP24
240.0000 mg | ORAL_CAPSULE | Freq: Every day | ORAL | 3 refills | Status: DC
Start: 2020-06-13 — End: 2020-08-23

## 2020-06-13 MED ORDER — PANTOPRAZOLE SODIUM 40 MG PO TBEC
40.0000 mg | DELAYED_RELEASE_TABLET | Freq: Every day | ORAL | 3 refills | Status: DC
Start: 2020-06-13 — End: 2020-07-14

## 2020-06-13 MED ORDER — ASPIRIN EC 81 MG PO TBEC
81.0000 mg | DELAYED_RELEASE_TABLET | Freq: Every day | ORAL | 5 refills | Status: DC
Start: 2020-06-13 — End: 2022-03-04

## 2020-06-13 MED ORDER — GLIMEPIRIDE 2 MG PO TABS
2.0000 mg | ORAL_TABLET | Freq: Every day | ORAL | 2 refills | Status: DC
Start: 2020-06-13 — End: 2020-10-12

## 2020-06-13 MED ORDER — VITAMIN C 100 MG PO TABS
250.0000 mg | ORAL_TABLET | Freq: Every day | ORAL | 2 refills | Status: DC
Start: 2020-06-13 — End: 2020-08-16

## 2020-06-13 MED ORDER — THIAMINE HCL 100 MG PO TABS
100.0000 mg | ORAL_TABLET | Freq: Every day | ORAL | 2 refills | Status: DC
Start: 2020-06-14 — End: 2023-05-15

## 2020-06-13 MED ORDER — ADULT MULTIVITAMIN W/MINERALS CH: 1.0000 | ORAL_TABLET | Freq: Every day | ORAL | 2 refills | Status: AC

## 2020-06-13 MED ORDER — PROAIR HFA 108 (90 BASE) MCG/ACT IN AERS: 2.0000 | INHALATION_SPRAY | RESPIRATORY_TRACT | 12 refills | Status: DC | PRN

## 2020-06-13 MED ORDER — DOXYCYCLINE HYCLATE 100 MG PO TABS: 100.0000 mg | ORAL_TABLET | Freq: Two times a day (BID) | ORAL | 0 refills | Status: AC

## 2020-06-13 MED ORDER — BUDESONIDE-FORMOTEROL FUMARATE 160-4.5 MCG/ACT IN AERO
2.0000 | INHALATION_SPRAY | Freq: Two times a day (BID) | RESPIRATORY_TRACT | 12 refills | Status: DC
Start: 2020-06-13 — End: 2020-09-15

## 2020-06-13 MED ORDER — SERTRALINE HCL 50 MG PO TABS: 50.0000 mg | ORAL_TABLET | Freq: Every day | ORAL | 3 refills | Status: AC

## 2020-06-13 MED ORDER — ATORVASTATIN CALCIUM 40 MG PO TABS
40.0000 mg | ORAL_TABLET | Freq: Every day | ORAL | 3 refills | Status: DC
Start: 2020-06-13 — End: 2020-09-15

## 2020-06-13 MED ORDER — SPIRIVA RESPIMAT 2.5 MCG/ACT IN AERS: 2.0000 | INHALATION_SPRAY | Freq: Every day | RESPIRATORY_TRACT | 3 refills | Status: DC

## 2020-06-13 MED ORDER — GUAIFENESIN ER 600 MG PO TB12: 600.0000 mg | ORAL_TABLET | Freq: Two times a day (BID) | ORAL | 0 refills | Status: DC

## 2020-06-13 MED ORDER — ALBUTEROL SULFATE (2.5 MG/3ML) 0.083% IN NEBU: 2.5000 mg | INHALATION_SOLUTION | RESPIRATORY_TRACT | 1 refills | Status: DC | PRN

## 2020-06-13 MED ORDER — GABAPENTIN 300 MG PO CAPS: 300.0000 mg | ORAL_CAPSULE | Freq: Every day | ORAL | 5 refills | Status: AC

## 2020-06-13 MED ORDER — PREDNISONE 20 MG PO TABS: 40.0000 mg | ORAL_TABLET | ORAL | 0 refills | Status: DC

## 2020-06-13 NOTE — Discharge Summary (Signed)
[name], is a 64 y.o. male  DOB [birth year]  MRN [id number].  Admission date:  06/10/2020  Admitting Physician  Roxan Hockey, MD  Discharge Date:  06/13/2020   Primary MD  Asencion Noble, MD  Recommendations for primary care physician for things to follow:   1) please take medications as prescribed 2) please follow-up with your primary care physician within a week for recheck and reevaluation 3)you need oxygen at home at 2 L via nasal cannula continuously while awake and while asleep--- smoking or having open fires around oxygen can cause fire, significant injury and death, you may use up to 3 L of oxygen via nasal cannula with activity  Admission Diagnosis  COPD exacerbation ([place]) [J44.1] Acute respiratory failure with hypoxia ([place]) [J96.01] COPD with acute exacerbation ([place]) [J44.1]   Discharge Diagnosis  COPD exacerbation ([place]) [J44.1] Acute respiratory failure with hypoxia ([place]) [J96.01] COPD with acute exacerbation ([place]) [J44.1]    Principal Problem:   COPD with acute exacerbation ([place]) Active Problems:   Acute on chronic respiratory failure with hypoxia ([name])   HTN (hypertension)   Type 2 diabetes mellitus without complication, without long-term current use of insulin ([place])   NASH (nonalcoholic steatohepatitis)   COPD GOLD III      Past Medical History:  Diagnosis Date  . Anxiety   . Asthma   . Chest pain    a. 2003 Cath: nl cors.  [name] COPD (chronic obstructive pulmonary disease) ([place])   . Depression   . Diabetes mellitus   . Dyslipidemia   . Dysrhythmia   . Fracture 12/15   lower back "L-1"  . GERD (gastroesophageal reflux disease)   . Hypertension   . PONV (postoperative nausea and vomiting)   . Splenomegaly     Past Surgical History:  Procedure Laterality Date  . BIOPSY  07/26/2016   Procedure: BIOPSY;  Surgeon: [name], MD;  [facility];  Service:  Endoscopy;;  gastric  . CARDIAC CATHETERIZATION  10/22/2001   normal coronary arteriea (Dr. Domenic Moras)  . COLONOSCOPY  08/31/2011   WOE:HOZYYQMG rectal and colon polyps-treated. Single tubular adenoma. Next TCS 08/2016  . COLONOSCOPY N/A 07/26/2016   Dr. [name]: diverticulosis in sigmoid and descending colon. 6 mm benign splenic flexure. surveillance 5 yeras  . ESOPHAGOGASTRODUODENOSCOPY N/A 07/26/2016   Dr. [name]: normal esophagus, portal gastropathy, chronic gastritis, normal duodenum, screening EGD 2 years   . ESOPHAGOGASTRODUODENOSCOPY (EGD) WITH ESOPHAGEAL DILATION N/A 05/15/2013   NOI:BBCWUG esophagus-status post Venia Minks dilation/Portal gastropathy. Antral erosions-status post biopsy. extrinsic compression along lesser curvature likely secondary to splenomegaly. gastric bx benign  . HERNIA REPAIR  03/2009  . POLYPECTOMY  07/26/2016   Procedure: POLYPECTOMY;  Surgeon: [name], MD;  [facility];  Service: Endoscopy;;  colon  . TRANSTHORACIC ECHOCARDIOGRAM  2011   EF 89-16%, stage 1 diastolic dysfunction, trace TR & pulm valve regurg   . VASECTOMY  1989     HPI  from the history and physical done on  the day of admission:    Tavonte Seybold  is a 63 y.o. male who is a reformed smoker (quit 10 yrs ago), with past medical history relevant for COPD Gold stage III, HTN, depression anxiety, NASH liver disease, HLD, diabetes and chronic hypoxic respiratory failure usually on 2 to 3 L of oxygen via nasal cannula at home who presents to the ED with increased work of breathing worsening hypoxia as per the EDP upon arrival patient required BiPAP right away -- -Unable to wean patient off of BiPAP due to persistent hypoxia and increased work of breathing at this time -Chest x-ray suggestive of right-sided pneumonia however chest x-ray is largely unchanged from recent chest x-ray -Patient denies fever or chills no chest pains no palpitations no dizziness no vomiting no diarrhea --Patient states  that his cough is slightly more productive than usual, mostly clear to slightly yellow sputum -CBC is remarkable for platelets of 84 which is close to patient's baseline -Sodium is 132, creatinine 0.75 -Covid test is negative    Hospital Course:    Brief Summary:- 64 y.o.malewho is a reformed smoker (quit 10 yrs ago),with past medical history relevant for COPD Gold stage III, HTN, depression anxiety, NASH liver disease, HLD, diabetes and chronic hypoxic respiratory failure usually on 2 to 3 L of oxygen via nasal cannula at home admitted on June 10, 2020 with acute on chronic hypoxic respiratory failure secondary to COPD exacerbation and pneumonia required continuous BiPAP for prolonged period of time initially on admission   A/p 1)Acute COPD Exacerbation- able to come off continuous BiPAP -Patient advised to discuss possible BiPAP use with pulmonologist Dr. Melvyn Novas post discharge --Will most likely need outpatient sleep study to qualify --Patient was treated with IV Solu-Medrol   mucolytics,Rocephin and doxycycline,and bronchodilators as ordered, supplemental oxygen -Chest x-ray with possible right-sided pneumonia however this finding is unchanged from prior chest x-rays -Patient completed Covid vaccination back in July 2021 06/12/20 --Overall improved significantly with above measures -Discharge home on oral prednisone and doxycycline  2)Acute on chronic hypoxic respiratory failure---secondary to #1 above -Baseline patient requires 2 to 3 L of oxygen at home PTA, --Oxygen requirement back to baseline at the time of discharge --Additional meds as above #1  3)DM2-- okay to risk Jardiance,  Amaryl,  sitagliptin/Metformin   4)HTN--stable, continue Cardizem CD 240 mg daily  5)depression and anxiety--- stable, continue  Zoloft, And Xanax  6)HLD--continue Lipitor  7)BPH--continue Flomax  8)NASH---fatty liver disease, patient denies alcohol  use  9)hyponatremia---sodium is up to 139 from 132, avoid excessive free water,  10)chronic thrombocytopenia--platelets stable and close to patient's based -- --Hyperkalemia resolved with [place] home with home O2   Dispo: The patient is from:Home Anticipated d/c is QI:HKVQ   Code Status :  -  Code Status: Full Code   Family Communication:   (patient is alert, awake and coherent)   Discussed with his wife Consults  :  Na     Discharge Condition: Stable  Follow UP   Follow-up Information    Asencion Noble, MD. Schedule an appointment as soon as possible for a visit.   Specialty: Internal Medicine Why: Follow-up as directed.  Contact information: [address] [place] [address] [phone]               Diet and Activity recommendation:  As advised  Discharge Instructions    Discharge Instructions    Call MD for:  difficulty breathing, headache or visual disturbances   Complete  by: As directed    Call MD for:  persistant dizziness or light-headedness   Complete by: As directed    Call MD for:  persistant nausea and vomiting   Complete by: As directed    Call MD for:  temperature >100.4   Complete by: As directed    Diet - low sodium heart healthy   Complete by: As directed    Diet Carb Modified   Complete by: As directed    Discharge instructions   Complete by: As directed    1) please take medications as prescribed 2) please follow-up with your primary care physician within a week for recheck and reevaluation 3)you need oxygen at home at 2 L via nasal cannula continuously while awake and while asleep--- smoking or having open fires around oxygen can cause fire, significant injury and death, you may use up to 3 L of oxygen via nasal cannula with activity   Increase activity slowly   Complete by: As directed       Discharge Medications     Allergies as of 06/13/2020      Reactions    Baclofen    lethargic    Simvastatin Nausea Only      Medication List    STOP taking these medications   azithromycin 500 MG tablet Commonly known as: ZITHROMAX   cefdinir 300 MG capsule Commonly known as: OMNICEF   metFORMIN 1000 MG tablet Commonly known as: GLUCOPHAGE     TAKE these medications   acetaminophen 500 MG tablet Commonly known as: TYLENOL Take 1,000 mg by mouth daily as needed for headache.   albuterol (2.5 MG/3ML) 0.083% nebulizer solution Commonly known as: PROVENTIL Take 3 mLs (2.5 mg total) by nebulization every 2 (two) hours as needed for wheezing or shortness of breath (cough). What changed: reasons to take this   ProAir HFA 108 (90 Base) MCG/ACT inhaler Generic drug: albuterol Inhale 2 puffs into the lungs every 4 (four) hours as needed for wheezing or shortness of breath. What changed: Another medication with the same name was changed. Make sure you understand how and when to take each.   ALPRAZolam 0.25 MG tablet Commonly known as: XANAX Take 0.25 mg by mouth 2 (two) times daily as needed for sleep.   aspirin EC 81 MG tablet Take 1 tablet (81 mg total) by mouth daily with breakfast. What changed: when to take this   atorvastatin 40 MG tablet Commonly known as: LIPITOR Take 1 tablet (40 mg total) by mouth daily. What changed: additional instructions   budesonide-formoterol 160-4.5 MCG/ACT inhaler Commonly known as: Symbicort Inhale 2 puffs into the lungs 2 (two) times daily. What changed: when to take this   diltiazem 240 MG 24 hr capsule Commonly known as: CARDIZEM CD Take 1 capsule (240 mg total) by mouth daily.   doxycycline 100 MG tablet Commonly known as: VIBRA-TABS Take 1 tablet (100 mg total) by mouth 2 (two) times daily for 5 days.   gabapentin 300 MG capsule Commonly known as: NEURONTIN Take 1 capsule (300 mg total) by mouth at bedtime.   glimepiride 2 MG tablet Commonly known as: AMARYL Take 1 tablet (2 mg total) by  mouth daily with breakfast. Please do NOT take if you skip breakfast What changed:   medication strength  how much to take  when to take this  additional instructions   guaiFENesin 600 MG 12 hr tablet Commonly known as: MUCINEX Take 1 tablet (600 mg total) by mouth 2 (two)  times daily.   Janumet XR 914 849 4218 MG Tb24 Generic drug: SitaGLIPtin-MetFORMIN HCl Take 1 tablet by mouth daily.   Jardiance 25 MG Tabs tablet Generic drug: empagliflozin Take 25 mg by mouth every evening.   multivitamin with minerals Tabs tablet Take 1 tablet by mouth at bedtime.   OneTouch Delica Lancets 03J Misc   OneTouch Verio test strip Generic drug: glucose blood   OXYGEN Inhale 4 L into the lungs. Pt uses oxygen at night and when exerting himself   pantoprazole 40 MG tablet Commonly known as: PROTONIX Take 1 tablet (40 mg total) by mouth daily before breakfast.   predniSONE 20 MG tablet Commonly known as: Deltasone Take 2 tablets (40 mg total) by mouth See admin instructions. Always Take with food/Breakfast---- Take Prednisone 2 tablets (40 mg) daily for 5 days, then 1 tab (20 mg) daily for another 5 days then STOP What changed:   medication strength  how much to take  when to take this  additional instructions   sertraline 50 MG tablet Commonly known as: ZOLOFT Take 1 tablet (50 mg total) by mouth daily.   Spiriva Respimat 2.5 MCG/ACT Aers Generic drug: Tiotropium Bromide Monohydrate Inhale 2 puffs into the lungs daily.   tamsulosin 0.4 MG Caps capsule Commonly known as: FLOMAX Take 0.4 mg by mouth daily.   thiamine 100 MG tablet Take 1 tablet (100 mg total) by mouth daily. Start taking on: June 14, 2020   vitamin C 100 MG tablet Take 2.5 tablets (250 mg total) by mouth daily. What changed: how much to take   VITAMIN D PO Take 1 tablet by mouth daily.       Major procedures and Radiology Reports - PLEASE review detailed and final reports for all details, in  brief -    DG Chest Port 1 View  Result Date: 06/10/2020 CLINICAL DATA:  Dyspnea EXAM: PORTABLE CHEST 1 VIEW COMPARISON:  05/13/2020 FINDINGS: The lungs are symmetrically well expanded. There is new peripheral consolidation within the right lung base, likely infectious in the appropriate clinical setting. Benign calcified granuloma again noted within the a right upper lung zone. No pneumothorax or pleural effusion. Cardiac size within normal limits. The pulmonary vascularity is normal. No acute bone abnormality. IMPRESSION: Right basilar consolidation, likely infectious in the appropriate clinical setting. Electronically Signed   By: Fidela Salisbury MD   On: 06/10/2020 06:43   DG Chest Portable 1 View  Result Date: 05/23/2020 CLINICAL DATA:  Cough EXAM: PORTABLE CHEST 1 VIEW COMPARISON:  April 17, 2020 FINDINGS: The heart size and mediastinal contours are unchanged with mild cardiomegaly. There is prominence of the central pulmonary vasculature. Mildly increased hazy airspace opacity seen at the periphery of the right lung base and streaky airspace atelectasis in the right mid lung. There is calcified granulomata seen within the right lung apex. No acute osseous abnormality. IMPRESSION: Hazy airspace opacity at the periphery of the right lung base which could be due to atelectasis and/or infectious etiology. Pulmonary vascular congestion. Electronically Signed   By: Prudencio Pair M.D.   On: 05/23/2020 16:17    Micro Results    Recent Results (from the past 240 hour(s))  Resp Panel by RT-PCR (Flu A&B, Covid) Nasopharyngeal Swab     Status: None   Collection Time: 06/10/20  6:17 AM   Specimen: Nasopharyngeal Swab; Nasopharyngeal(NP) swabs in vial transport medium  Result Value Ref Range Status   SARS Coronavirus 2 by RT PCR NEGATIVE NEGATIVE Final  Comment: (NOTE) SARS-CoV-2 target nucleic acids are NOT DETECTED.  The SARS-CoV-2 RNA is generally detectable in upper respiratory specimens  during the acute phase of infection. The lowest concentration of SARS-CoV-2 viral copies this assay can detect is 138 copies/mL. A negative result does not preclude SARS-Cov-2 infection and should not be used as the sole basis for treatment or other patient management decisions. A negative result may occur with  improper specimen collection/handling, submission of specimen other than nasopharyngeal swab, presence of viral mutation(s) within the areas targeted by this assay, and inadequate number of viral copies(<138 copies/mL). A negative result must be combined with clinical observations, patient history, and epidemiological information. The expected result is Negative.  Fact Sheet for Patients:  EntrepreneurPulse.com.au  Fact Sheet for Healthcare Providers:  IncredibleEmployment.be  This test is no t yet approved or cleared by the [place] FDA and  has been authorized for detection and/or diagnosis of SARS-CoV-2 by FDA under an Emergency Use Authorization (EUA). This EUA will remain  in effect (meaning this test can be used) for the duration of the COVID-19 declaration under Section 564(b)(1) of the Act, 21 U.S.C.section 360bbb-3(b)(1), unless the authorization is terminated  or revoked sooner.       Influenza A by PCR NEGATIVE NEGATIVE Final   Influenza B by PCR NEGATIVE NEGATIVE Final    Comment: (NOTE) The Xpert Xpress SARS-CoV-2/FLU/RSV plus assay is intended as an aid in the diagnosis of influenza from Nasopharyngeal swab specimens and should not be used as a sole basis for treatment. Nasal washings and aspirates are unacceptable for Xpert Xpress SARS-CoV-2/FLU/RSV testing.  Fact Sheet for Patients: EntrepreneurPulse.com.au  Fact Sheet for Healthcare Providers: IncredibleEmployment.be  This test is not yet approved or cleared by the [place] FDA and has been authorized for detection  and/or diagnosis of SARS-CoV-2 by FDA under an Emergency Use Authorization (EUA). This EUA will remain in effect (meaning this test can be used) for the duration of the COVID-19 declaration under Section 564(b)(1) of the Act, 21 U.S.C. section 360bbb-3(b)(1), unless the authorization is terminated or revoked.  Performed at [facility], [address], [place], [place] [address]   MRSA PCR Screening     Status: None   Collection Time: 06/10/20 11:26 AM   Specimen: Nasal Mucosa; Nasopharyngeal  Result Value Ref Range Status   MRSA by PCR NEGATIVE NEGATIVE Final    Comment:        The GeneXpert MRSA Assay (FDA approved for NASAL specimens only), is one component of a comprehensive MRSA colonization surveillance program. It is not intended to diagnose MRSA infection nor to guide or monitor treatment for MRSA infections. Performed at [facility], [address], [place], [place] [address]     Today   Subjective    Emeka Lindner today has no new concerns -Cough and shortness of breath is improved significantly -Ambulated back and forth to nursing station without significant dyspnea on exertion, maintaining good O2 sats on 3 L of oxygen via nasal cannula        Patient has been seen and examined prior to discharge   Objective   Blood pressure 106/69, pulse 80, temperature 98 F (36.7 C), resp. rate 20, height 5' 7"  (1.702 m), weight 70.8 kg, SpO2 97 %.  No intake or output data in the 24 hours ending 06/13/20 1330  Exam Gen:- Awake Alert, no acute distress , speaking in complete sentences HEENT:- [place].AT, No sclera icterus Nose- [place] 2 L/min at rest Neck-Supple Neck,No  JVD,.  Lungs-improved air movement, no wheezing CV- S1, S2 normal, regular Abd-  +ve B.Sounds, Abd Soft, No tenderness,    Extremity/Skin:- No  edema,   good pulses Psych-affect is appropriate, oriented x3 Neuro-no new focal deficits, no tremors    Data Review   CBC w Diff:  Lab Results  Component  Value Date   WBC 2.6 (L) 06/11/2020   HGB 14.9 06/11/2020   HCT 48.2 06/11/2020   PLT 80 (L) 06/11/2020   LYMPHOPCT 11 06/10/2020   MONOPCT 9 06/10/2020   EOSPCT 0 06/10/2020   BASOPCT 0 06/10/2020    CMP:  Lab Results  Component Value Date   NA 139 06/12/2020   K 4.5 06/12/2020   CL 100 06/12/2020   CO2 30 06/12/2020   BUN 28 (H) 06/12/2020   CREATININE 0.66 06/12/2020   CREATININE 0.86 04/24/2013   PROT 8.5 (H) 05/23/2020   ALBUMIN 4.0 05/23/2020   ALBUMIN 4.5 06/22/2011   BILITOT 1.4 (H) 05/23/2020   BILITOT 0.5 06/22/2011   ALKPHOS 82 05/23/2020   ALKPHOS 41 06/22/2011   AST 18 05/23/2020   AST 22 06/22/2011   ALT 27 05/23/2020  .   Total Discharge time is about 33 minutes  Roxan Hockey M.D on 06/13/2020 at 1:30 PM  Go to [email or web address] -  for contact info  [facility] - Office  [phone]

## 2020-06-13 NOTE — TOC Transition Note (Signed)
Transition of Care [facility]) - CM/SW Discharge Note   Patient Details  Name: [name] MRN: [id number] Date of Birth: Sep 24, 1955  [facility] CM/SW Contact:  Shade Flood, LCSW Phone Number: 06/13/2020, 1:05 PM   Clinical Narrative:     Pt stable for dc today per MD. Plan remains for dc home. Attempted to schedule pt a PCP hospital follow up appointment at Dr. Ria Comment office though there was no answer with several attempts. Pt will have to call and schedule for himself.  No other TOC needs identified for dc.  Final next level of care: Home/Self Care Barriers to Discharge: Continued Medical Work up   Patient Goals and CMS Choice     Choice offered to / list presented to : Patient  Discharge Placement                       Discharge Plan and Services                                     Social Determinants of Health (SDOH) Interventions     Readmission Risk Interventions Readmission Risk Prevention Plan 06/12/2020  Transportation Screening Complete  HRI or Home Care Consult Complete  Social Work Consult for [place] Planning/Counseling Complete  Palliative Care Screening Not Applicable  Medication Review [employer]) Complete  Some recent data might be hidden

## 2020-06-13 NOTE — Care Management Important Message (Signed)
Important Message  Patient Details  Name: [name] MRN: [id number] Date of Birth: [birth year]   Medicare Important Message Given:  Yes     Tommy Medal 06/13/2020, 2:16 PM

## 2020-06-13 NOTE — Progress Notes (Signed)
Ambulated patient with 3 liters of oxygen nasal canula. Oxygen saturation maintained at 96 when walking back to patients room patient became short of breath oxygen saturation dropped to 93-94 while on 3 liters nasal canula. MD made aware.

## 2020-06-14 NOTE — Telephone Encounter (Signed)
Called and spoke to pt's wife, Katharine Look. She states the pt was recently hospitalized and was advised at discharge that pt may need a bipap at home. Pt has been scheduled for an OV with Dr. Melvyn Novas on 07/05/20. Pt verbalized understanding and denied any further questions or concerns at this time.

## 2020-06-22 DIAGNOSIS — R0602 Shortness of breath: Secondary | ICD-10-CM | POA: Diagnosis not present

## 2020-06-23 DIAGNOSIS — J449 Chronic obstructive pulmonary disease, unspecified: Secondary | ICD-10-CM | POA: Diagnosis not present

## 2020-06-30 DIAGNOSIS — J189 Pneumonia, unspecified organism: Secondary | ICD-10-CM | POA: Diagnosis not present

## 2020-06-30 DIAGNOSIS — J410 Simple chronic bronchitis: Secondary | ICD-10-CM | POA: Diagnosis not present

## 2020-07-05 ENCOUNTER — Ambulatory Visit: Payer: PPO | Admitting: Internal Medicine

## 2020-07-05 NOTE — Progress Notes (Deleted)
[name], male    DOB: 1955-09-27     MRN: [id number]   Brief patient profile:  52 yowm MM/quit smoking 2012 with COPD GOLD III criteria 06/17/20 previously followed by Dr [name] referred back to pulmonary clinic 12/09/2019 in [place].   History of Present Illness  12/09/2019  Pulmonary/ 1st [name] eval/[name]  symbicort 160/spiriva  Chief Complaint  Patient presents with  . Pulmonary Consult    Former patient of Dr [name].  Breathing has been worse over the past few wks. Recently treated with Doxy per PCP for COPD exacerbation. He is occ coughing up some green sputum.   flare 11/23/19  rx doxy 5/19 x 7 days  Dyspnea:  Baseline uses HC parking / MMRC3 = can't walk 100 yards even at a slow pace at a flat grade s stopping due to sob  On 2lpm  Cough: improved less purulent purulent  Sleep: wakes up coughing since onset /sometimes uses saba proair  SABA use: rarely daytime rec Continue protonix 40 mg Take 30-60 min before first meal of the day  GERD diet  Plan A = Automatic = Always=    Budesonide/formoterol (symbicort ) 2 puffs first thing in am and 12 hours later and use spiriva x 2 pffs after the morning symbicort (ok to change back to the powder after 2 weeks but will need to stop it if you start coughing  Work on inhaler technique  Plan B = Backup (to supplement plan A, not to replace it) Only use your albuterol inhaler as a rescue medication    Plan C = Crisis (instead of Plan B   Make sure you check your oxygen saturations at highest level of activity to be sure it stays over 90%   Prednisone 10 mg take  4 each am x 2 days,   2 each am x 2 days,  1 each am x 2 days and stop  For cough > mucinex dm 1200 mg every 12 hours as needed  Would wait another 2 weeks before you get your first shot for covid 19    02/03/2020  f/u ov/[name] re: GOLD III/ 02 dep/ rx symb /spriva powder  Chief Complaint  Patient presents with  . Acute Visit    shortness of breath with exertion, productive  cough with green phlegm  Dyspnea: abruptly worse x 48 h = MMRC3 = can't walk 100 yards even at a slow pace at a flat grade s stopping due to sob   Cough: worse with green mucus/ worse since changed back to dpi spiriva Sleeping: 2 am tends to cramp SABA use: none on day of ov  02: 2lpm  Up to 2.5 lpm  rec zpak Prednisone 10 mg take  4 each am x 2 days,   2 each am x 2 days,  1 each am x 2 days and stop  Change spiriva to respimat 2 puff each am  For cough mucinex dm up to 1200 mg every 12 hours and supplement hydocodone every 4 hours as needed  Get your covid vaccination asap We will call [employer] for a best fit analysis for portable 0xygen  In meantime, Make sure you check your oxygen saturations at highest level of activity to be sure it stays over 90% and adjust upward to maintain this level if needed but remember to turn it back to previous settings when you stop (to conserve your supply).    04/28/2020  f/u ov/[name]/[name] re: GOLD III/ 02  dep maint rx spiriva/symbicort 160 Chief Complaint  Patient presents with  . Follow-up    Pt states dx with PNA on 04/17/20- treated with zpack.  He states his breathing is slowly improving since then.  He is using his albuterol inhaler 2 x per wk and he has not used neb.  Dyspnea:  [employer] x slow on 02 3lpm x 3 ailes , not checking sats   Cough: better, no more green mucus  Sleeping: 2 pillows bed is flat  SABA use: hfa couple times a week, not neb 02: 3lpm 24/7  rec Work on inhaler technique:  relax and gently blow all the way out then take a nice smooth deep breath back in, triggering the inhaler at same time you start breathing in.   Make sure you check your oxygen saturations at highest level of activity to be sure it stays over 90% and adjust  02 flow upward to maintain this level if needed but remember to turn it back to previous settings when you stop (to conserve your supply).  If your medications are not affordable on your new  plan bring in your drug formulary and I will pick the best option.     05/23/2020  Acutely ill ov/[name]/[name] re: new shaking chill 11/13 / and nasty color / new R cp  Chief Complaint  Patient presents with  . Follow-up    productive cough with green-brownish-red phlegm since saturday, shortness of breath with activity  Dyspnea: at rest  Cough: rattling/ honking cough  Sleeping: on side 2-3 pillows  SABA use: has not tried saba  02: 3lpm Pulsed  Does not have neb equipment, not sure he can get pantoprazole or symbicort  Thru his insurance company rec I recommend you go to the ER for evaluation for possible pneumonia.    Admission date:  06/10/2020    Discharge Date:  06/13/2020  1) please take medications as prescribed 2) please follow-up with your primary care physician within a week for recheck and reevaluation 3)you need oxygen at home at 2 L via nasal cannula continuously while awake and while asleep--- smoking or having open fires around oxygen can cause fire, significant injury and death, you may use up to 3 L of oxygen via nasal cannula with activity  Discharge Diagnosis  COPD exacerbation ([name]) [J44.1] Acute respiratory failure with hypoxia ([name]) [J96.01] COPD with acute exacerbation ([name]) [J44.1]     Acute on chronic respiratory failure with hypoxia ([name])   HTN (hypertension)   Type 2 diabetes mellitus without complication, without long-term current use of insulin ([name])   NASH (nonalcoholic steatohepatitis)   COPD GOLD III    HPI  from the history and physical done on the day of admission:    [name] a64 y.o.malewho is a reformed smoker (quit 10 yrs ago),with past medical history relevant for COPD Gold stage III, HTN, depression anxiety, NASH liver disease, HLD, diabetes and chronic hypoxic respiratory failure usually on 2 to 3 L of oxygen via nasal cannula at home who presents to the ED with increased work of breathing worsening hypoxia as per the  [name] upon arrival patient required BiPAP right away --Unable to wean patient off of BiPAP due to persistent hypoxia and increased work of breathing at this time -Chest x-ray suggestive of right-sided pneumonia however chest x-ray is largely unchanged from recent chest x-ray -Patient denies fever or chills no chest pains no palpitations no dizziness no vomiting no diarrhea --Patient states that his cough  is slightly more productive than usual, mostly clear to slightly yellow sputum -CBC is remarkable for platelets of 84 which is close to patient's baseline -Sodium is 132, creatinine 0.75 -Covid test is negative    Hospital Course:  A/p 1)Acute COPD Exacerbation- able to come off continuous BiPAP -Patient advised to discuss possible BiPAP use with pulmonologist Dr. Melvyn Novas post discharge --Will most likely need outpatient sleep study to qualify --Patient was treated with IV Solu-Medrol  mucolytics,Rocephin and doxycycline,and bronchodilators as ordered, supplemental oxygen -Chest x-ray with possible right-sided pneumonia however this finding is unchanged from prior chest x-rays -Patient completed Covid vaccination back in July 2021 06/12/20 --Overall improved significantly with above measures -Discharge home on oral prednisone and doxycycline  2)Acute on chronic hypoxic respiratory failure---secondary to #1 above -Baseline patient requires 2 to 3 L of oxygen at home PTA, --Oxygen requirement back to baseline at the time of discharge --Additional meds as above #1     07/05/2020  f/u ov/Diggins [name]/Saarah Dewing re:  No chief complaint on file.    Dyspnea:  *** Cough: *** Sleeping: *** SABA use: *** 02: ***   No obvious day to day or daytime variability or assoc excess/ purulent sputum or mucus plugs or hemoptysis or cp or chest tightness, subjective wheeze or overt sinus or hb symptoms.   *** without nocturnal  or early am exacerbation  of respiratory  c/o's or need for  noct saba. Also denies any obvious fluctuation of symptoms with weather or environmental changes or other aggravating or alleviating factors except as outlined above   No unusual exposure hx or h/o childhood pna/ asthma or knowledge of premature birth.  Current Allergies, Complete Past Medical History, Past Surgical History, Family History, and Social History were reviewed in [employer] record.  ROS  The following are not active complaints unless bolded Hoarseness, sore throat, dysphagia, dental problems, itching, sneezing,  nasal congestion or discharge of excess mucus or purulent secretions, ear ache,   fever, chills, sweats, unintended wt loss or wt gain, classically pleuritic or exertional cp,  orthopnea pnd or arm/hand swelling  or leg swelling, presyncope, palpitations, abdominal pain, anorexia, nausea, vomiting, diarrhea  or change in bowel habits or change in bladder habits, change in stools or change in urine, dysuria, hematuria,  rash, arthralgias, visual complaints, headache, numbness, weakness or ataxia or problems with walking or coordination,  change in mood or  memory.        No outpatient medications have been marked as taking for the 07/05/20 encounter (Appointment) with Tanda Rockers, MD.                                    Past Medical History:  Diagnosis Date  . Anxiety   . Asthma   . Chest pain    a. 2003 Cath: nl cors.  [name] COPD (chronic obstructive pulmonary disease) ([place])   . Depression   . Diabetes mellitus   . Dyslipidemia   . Dysrhythmia   . Fracture 12/15   lower back "L-1"  . GERD (gastroesophageal reflux disease)   . Hypertension   . PONV (postoperative nausea and vomiting)   . Splenomegaly        Objective:    07/05/2020       *** 05/23/2020      166 04/28/2020      166   12/30/19 162 lb (73.5 kg)  12/21/19 162 lb (73.5 kg)  12/09/19 162 lb (73.5 kg)   Vital signs reviewed  07/05/2020  - Note at rest 02  sats  ***% on ***   General appearance:    ***     Mod barrel  contour chest wall  ***                Assessment

## 2020-07-13 NOTE — Progress Notes (Signed)
Referring Provider: Asencion Noble, MD Primary Care Physician:  Asencion Noble, MD Primary GI Physician: Dr. [name]  Chief Complaint  Patient presents with  . nash    F/u. Needs refill on pantoprazole    HPI:   [name] is a 65 y.o. male presenting today with a history of today with a history of GERD and biopsy proven NASH fibrosis, elastography F2/F3, chronic thrombocytopenia following with hematology, splenomegaly, portal gastropathy.  Most recent abdominal imaging 04/17/2020 (CT A/P with contrast) stating liver is normal in size and contour.  Last EGD in 2018 with portal gastropathy, was due for repeat in 2020. Last colonoscopy in 2018, due again in 2023.   Recently hospitalized in early December for COPD exacerbation/acute respiratory failure with hypoxia.  He was to have follow-up with Dr. Melvyn Novas on 12/28 but no showed to this appointment.   Recent labs on file from November and December with platelets in the 80-90 range hemoglobin within normal limits.  LFTs within normal limits.  Total bilirubin slightly elevated at 1.4 during hospitalization in November for pneumonia, previously within normal limits. No recent INR on file.   Today:  GERD: Well controlled on Protonix 40 mg daily. Occasional solid food dysphagia. Maybe once a week. No trouble with liquids.   NASH fibrosis: Reports swelling in his LE that started right after he got out of the hospital in December. Had a provider come out to his house to check on him after hospitalization. They prescribed furosemide on 06/21/20.  Took 40 mg daily for 2-3 days then decreased to 20 mg daily.  Lower extremity edema essentially resolved.  No swelling in his abdomen.  No change in mental status.  States he occasionally walks in the room and may forget why he went there, but quickly remembers.  This is chronic.  No brbpr or melena. Bruises easily.   Doesn't salt any foods. Eats frozen meals. Rarely eats out of a can.   Hepatitis A/B  vaccination: Thinks he has completed vaccination with [facility] in [place].   No N/V or abdominal. No constipation or diarrhea.   On 3-3.5L [place] typically. Breathing is improving but not back to normal. Little cough. Will schedule an appointment with Dr. Melvyn Novas.  Did not realize he missed his follow-up with Dr. Melvyn Novas.  No chest pain or heart palpitations.    Past Medical History:  Diagnosis Date  . Anxiety   . Asthma   . Chest pain    a. 2003 Cath: nl cors.  [name] COPD (chronic obstructive pulmonary disease) ([place])   . Depression   . Diabetes mellitus   . Dyslipidemia   . Dysrhythmia   . Fracture 12/15   lower back "L-1"  . GERD (gastroesophageal reflux disease)   . Hypertension   . NASH (nonalcoholic steatohepatitis) 2018   [name], undergoing routine cirrhosis care  . PONV (postoperative nausea and vomiting)   . Splenomegaly     Past Surgical History:  Procedure Laterality Date  . BIOPSY  07/26/2016   Procedure: BIOPSY;  Surgeon: [name], MD;  [facility];  Service: Endoscopy;;  gastric  . CARDIAC CATHETERIZATION  10/22/2001   normal coronary arteriea (Dr. Domenic Moras)  . COLONOSCOPY  08/31/2011   HYQ:MVHQIONG rectal and colon polyps-treated. Single tubular adenoma. Next TCS 08/2016  . COLONOSCOPY N/A 07/26/2016   Dr. [name]: diverticulosis in sigmoid and descending colon. 6 mm benign splenic flexure. surveillance 5 yeras  . ESOPHAGOGASTRODUODENOSCOPY N/A 07/26/2016  Dr. [name]: normal esophagus, portal gastropathy, chronic gastritis, normal duodenum, screening EGD 2 years   . ESOPHAGOGASTRODUODENOSCOPY (EGD) WITH ESOPHAGEAL DILATION N/A 05/15/2013   DZH:GDJMEQ esophagus-status post Venia Minks dilation/Portal gastropathy. Antral erosions-status post biopsy. extrinsic compression along lesser curvature likely secondary to splenomegaly. gastric bx benign  . HERNIA REPAIR  03/2009  . POLYPECTOMY  07/26/2016   Procedure: POLYPECTOMY;  Surgeon: [name], MD;  [facility]:  [facility];  Service: Endoscopy;;  colon  . TRANSTHORACIC ECHOCARDIOGRAM  2011   EF 68-34%, stage 1 diastolic dysfunction, trace TR & pulm valve regurg   . VASECTOMY  1989    Current Outpatient Medications  Medication Sig Dispense Refill  . acetaminophen (TYLENOL) 500 MG tablet Take 1,000 mg by mouth daily as needed for headache.     . albuterol (PROVENTIL) (2.5 MG/3ML) 0.083% nebulizer solution Take 3 mLs (2.5 mg total) by nebulization every 2 (two) hours as needed for wheezing or shortness of breath (cough). 75 mL 1  . ALPRAZolam (XANAX) 0.25 MG tablet Take 0.25 mg by mouth 2 (two) times daily as needed for sleep.     . Ascorbic Acid (VITAMIN C) 100 MG tablet Take 2.5 tablets (250 mg total) by mouth daily. 30 tablet 2  . aspirin EC 81 MG tablet Take 1 tablet (81 mg total) by mouth daily with breakfast. 30 tablet 5  . atorvastatin (LIPITOR) 40 MG tablet Take 1 tablet (40 mg total) by mouth daily. 90 tablet 3  . budesonide-formoterol (SYMBICORT) 160-4.5 MCG/ACT inhaler Inhale 2 puffs into the lungs 2 (two) times daily. 1 each 12  . diltiazem (CARDIZEM CD) 240 MG 24 hr capsule Take 1 capsule (240 mg total) by mouth daily. 30 capsule 3  . furosemide (LASIX) 20 MG tablet Take 20 mg by mouth.    . gabapentin (NEURONTIN) 300 MG capsule Take 1 capsule (300 mg total) by mouth at bedtime. 30 capsule 5  . glimepiride (AMARYL) 2 MG tablet Take 1 tablet (2 mg total) by mouth daily with breakfast. Please do NOT take if you skip breakfast 90 tablet 2  . guaiFENesin (MUCINEX) 600 MG 12 hr tablet Take 1 tablet (600 mg total) by mouth 2 (two) times daily. (Patient taking differently: Take 600 mg by mouth 2 (two) times daily as needed.) 20 tablet 0  . JARDIANCE 25 MG TABS tablet Take 25 mg by mouth every evening.   3  . Multiple Vitamin (MULTIVITAMIN WITH MINERALS) TABS tablet Take 1 tablet by mouth at bedtime. 30 tablet 2  . ONETOUCH DELICA LANCETS 19Q MISC     . ONETOUCH VERIO test strip     . OXYGEN  Inhale 4 L into the lungs. Pt uses oxygen at night and when exerting himself    . pantoprazole (PROTONIX) 40 MG tablet Take 1 tablet (40 mg total) by mouth daily. 90 tablet 3  . potassium chloride (MICRO-K) 10 MEQ CR capsule Take 10 mEq by mouth daily.    [name] PROAIR HFA 108 (90 Base) MCG/ACT inhaler Inhale 2 puffs into the lungs every 4 (four) hours as needed for wheezing or shortness of breath. 18 g 12  . sertraline (ZOLOFT) 50 MG tablet Take 1 tablet (50 mg total) by mouth daily. 30 tablet 3  . SitaGLIPtin-MetFORMIN HCl [id number] MG TB24 Take 1 tablet by mouth daily.    . tamsulosin (FLOMAX) 0.4 MG CAPS capsule Take 0.4 mg by mouth daily.    [name] thiamine 100 MG tablet Take 1 tablet (100 mg  total) by mouth daily. 30 tablet 2  . Tiotropium Bromide Monohydrate (SPIRIVA RESPIMAT) 2.5 MCG/ACT AERS Inhale 2 puffs into the lungs daily. 12 g 3  . VITAMIN D PO Take 1 tablet by mouth daily.     No current facility-administered medications for this visit.    Allergies as of 07/14/2020 - Review Complete 07/14/2020  Allergen Reaction Noted  . Baclofen  07/24/2016  . Simvastatin Nausea Only 10/05/2013    Family History  Problem Relation Age of Onset  . Heart disease Mother   . Cancer Mother        lymph nodes  . Diabetes Mother   . Arthritis Other   . Lung disease Other   . Asthma Other   . Kidney disease Other   . Ovarian cancer Sister   . Diabetes Brother   . Heart disease Brother   . Hypertension Brother   . Diabetes Sister   . Diabetes Sister   . Cirrhosis Sister 13       no etoh  . Colon cancer Neg Hx     Social History   Socioeconomic History  . Marital status: Married    Spouse name: Not on file  . Number of children: Not on file  . Years of education: Not on file  . Highest education level: Not on file  Occupational History  . Not on file  Tobacco Use  . Smoking status: Former Smoker    Packs/day: 1.25    Years: 36.00    Pack years: 45.00    Types: Cigarettes    Quit  date: 04/2011    Years since quitting: 9.2  . Smokeless tobacco: Former Network engineer  . Vaping Use: Never used  Substance and Sexual Activity  . Alcohol use: No  . Drug use: No  . Sexual activity: Not on file  Other Topics Concern  . Not on file  Social History Narrative  . Not on file   Social Determinants of Health   Financial Resource Strain: Not on file  Food Insecurity: Not on file  Transportation Needs: Not on file  Physical Activity: Not on file  Stress: Not on file  Social Connections: Not on file    Review of Systems: Gen: Denies fever, chills, cold or flulike symptoms, presyncope, syncope. CV: See HPI Resp: See HPI GI: See HPI Heme: See HPI  Physical Exam: BP 113/69   Pulse 94   Temp (!) 97 F (36.1 C)   Ht 5' 7"  (1.702 m)   Wt 164 lb 12.8 oz (74.8 kg)   BMI 25.81 kg/m  General:   Alert and oriented. No distress noted. Pleasant and cooperative.  Nasal cannula in place. Head:  Normocephalic and atraumatic. Eyes:  Conjuctiva clear without scleral icterus. Heart:  S1, S2 present without murmurs appreciated. Lungs:  Clear to auscultation bilaterally. No wheezes, rales, or rhonchi. No distress.  Abdomen:  +BS.  Protuberant but soft and nontender. No rebound or guarding. No HSM or masses noted. Msk:  Symmetrical without gross deformities. Normal posture. Extremities:  With trace lower extremity edema. Neurologic:  Alert and  oriented x4.  No asterixis. Psych: Normal mood and affect.

## 2020-07-14 ENCOUNTER — Ambulatory Visit (INDEPENDENT_AMBULATORY_CARE_PROVIDER_SITE_OTHER): Payer: HMO | Admitting: Gastroenterology

## 2020-07-14 ENCOUNTER — Telehealth: Payer: Self-pay

## 2020-07-14 ENCOUNTER — Other Ambulatory Visit: Payer: Self-pay

## 2020-07-14 ENCOUNTER — Encounter: Payer: Self-pay | Admitting: Gastroenterology

## 2020-07-14 ENCOUNTER — Encounter: Payer: Self-pay | Admitting: Internal Medicine

## 2020-07-14 VITALS — BP 113/69 | HR 94 | Temp 97.0°F | Ht 67.0 in | Wt 164.8 lb

## 2020-07-14 DIAGNOSIS — K219 Gastro-esophageal reflux disease without esophagitis: Secondary | ICD-10-CM | POA: Diagnosis not present

## 2020-07-14 DIAGNOSIS — K7400 Hepatic fibrosis, unspecified: Secondary | ICD-10-CM

## 2020-07-14 DIAGNOSIS — K7581 Nonalcoholic steatohepatitis (NASH): Secondary | ICD-10-CM | POA: Diagnosis not present

## 2020-07-14 MED ORDER — PANTOPRAZOLE SODIUM 40 MG PO TBEC: 40.0000 mg | DELAYED_RELEASE_TABLET | Freq: Every day | ORAL | 3 refills | Status: DC

## 2020-07-14 NOTE — Telephone Encounter (Signed)
Pt was seen in office today. Per Aliene Altes, PA, she asked if [facility] could be contacted to see if pt has had hep A & B vaccine. [facility] and wasn't able to get someone. Will call back.

## 2020-07-14 NOTE — Patient Instructions (Addendum)
Please call Dr. Gustavus Bryant office to make a follow-up appointment ASAP as she missed her appointment on 12/28.  Please have labs completed at [facility].  Please have ultrasound completed at [facility].  I have sent a refill of Protonix 40 mg daily 30 minutes before breakfast to your pharmacy.  Continue Lasix 20 mg daily.  Follow a strict low-sodium diet.  No more than 2000 mg daily.  This includes everything you eat and drink.  See handout below.  No more than 2000 mg of Tylenol daily.  Monitor for swelling in her abdomen, swelling in your lower extremities, confusion/change in mental status, bright red blood per rectum, melena, yellowing of the eyes or skin and let us know if this occurs.  We will plan to see back in 3 months to discuss scheduling upper endoscopy.  Please call with questions or concerns prior.   Aliene Altes, PA-C [facility]

## 2020-07-15 ENCOUNTER — Encounter: Payer: Self-pay | Admitting: Gastroenterology

## 2020-07-15 NOTE — Assessment & Plan Note (Addendum)
65 year old male with biopsy-proven NASH in 2018, elastography with F2/F3 chronic thrombocytopenia following with hematology, splenomegaly, and portal gastropathy who has been undergoing routine cirrhosis monitoring.  Last EGD in 2018 with portal gastropathy with recommendations to repeat in 2020.  Most recent abdominal imaging includes CT A/P with contrast October 2021 with normal-appearing liver.  Recent labs during hospitalizations (pneumonia/COPD exacerbation) in November and December with platelets in the 80-90 range, LFTs WNL, T bili slightly elevated at 1.4.  No recent INR.  Reports fairly significant lower extremity edema that began after hospital discharge.  He was started on furosemide 40 mg on 12/14 for 2-3 days, then decrease to 20 mg daily.  Lower extremity edema essentially resolved with trace LE edema on exam.  Unclear etiology.  Cannot rule out decompensating liver disease.  No significant change in mental status or other signs or symptoms of decompensated liver disease.  Plan: CBC, CMP, INR, AFP, abdominal ultrasound now. Continue furosemide 20 mg daily for now. Counseled on 2 g sodium diet.  Handout provided. No more than 2000 mg Tylenol daily. Patient thinks he has received hep A/B vaccination through [facility] in [place].  We will try to reach out to them to verify. He is due for EGD for variceal screening at this time.  As his respiratory status has not yet returned to baseline and he missed his follow-up with pulmonology, I have requested to reach out to Dr. Melvyn Novas to arrange follow-up. Follow-up in 3 months to discuss scheduling EGD.

## 2020-07-15 NOTE — Assessment & Plan Note (Signed)
Well-controlled on Protonix 40 mg daily.  Like to continue his current medications.  Follow-up in 3 months to discuss scheduling EGD due to history of NASH discussed below.

## 2020-07-19 ENCOUNTER — Other Ambulatory Visit (HOSPITAL_COMMUNITY)
Admission: RE | Admit: 2020-07-19 | Discharge: 2020-07-19 | Disposition: A | Discharge status: Home / Self Care | Payer: HMO | Source: Ambulatory Visit | Attending: Gastroenterology | Admitting: Gastroenterology

## 2020-07-19 ENCOUNTER — Other Ambulatory Visit: Payer: Self-pay

## 2020-07-19 ENCOUNTER — Ambulatory Visit (HOSPITAL_COMMUNITY)
Admission: RE | Admit: 2020-07-19 | Discharge: 2020-07-19 | Disposition: A | Discharge status: Home / Self Care | Payer: HMO | Source: Ambulatory Visit | Attending: Gastroenterology | Admitting: Gastroenterology

## 2020-07-19 DIAGNOSIS — R161 Splenomegaly, not elsewhere classified: Secondary | ICD-10-CM | POA: Diagnosis not present

## 2020-07-19 DIAGNOSIS — K7400 Hepatic fibrosis, unspecified: Secondary | ICD-10-CM | POA: Insufficient documentation

## 2020-07-19 DIAGNOSIS — K7581 Nonalcoholic steatohepatitis (NASH): Secondary | ICD-10-CM | POA: Insufficient documentation

## 2020-07-19 LAB — PROTIME-INR
INR: 1 (ref 0.8–1.2)
Prothrombin Time: 12.7 seconds (ref 11.4–15.2)

## 2020-07-19 LAB — CBC WITH DIFFERENTIAL/PLATELET
Abs Immature Granulocytes: 0.02 10*3/uL (ref 0.00–0.07)
Basophils Absolute: 0 10*3/uL (ref 0.0–0.1)
Basophils Relative: 1 %
Eosinophils Absolute: 0.1 10*3/uL (ref 0.0–0.5)
Eosinophils Relative: 3 %
HCT: 49.2 % (ref 39.0–52.0)
Hemoglobin: 15.1 g/dL (ref 13.0–17.0)
Immature Granulocytes: 0 %
Lymphocytes Relative: 24 %
Lymphs Abs: 1.1 10*3/uL (ref 0.7–4.0)
MCH: 25.3 pg — ABNORMAL LOW (ref 26.0–34.0)
MCHC: 30.7 g/dL (ref 30.0–36.0)
MCV: 82.4 fL (ref 80.0–100.0)
Monocytes Absolute: 0.5 10*3/uL (ref 0.1–1.0)
Monocytes Relative: 11 %
Neutro Abs: 2.7 10*3/uL (ref 1.7–7.7)
Neutrophils Relative %: 61 %
Platelets: 113 10*3/uL — ABNORMAL LOW (ref 150–400)
RBC: 5.97 MIL/uL — ABNORMAL HIGH (ref 4.22–5.81)
RDW: 18.6 % — ABNORMAL HIGH (ref 11.5–15.5)
WBC: 4.5 10*3/uL (ref 4.0–10.5)
nRBC: 0 % (ref 0.0–0.2)

## 2020-07-19 NOTE — Telephone Encounter (Signed)
Spoke with [employer]. From the records they see, pt had a Hep B vaccine on 08/05/2017. No other records of Hep A/B seen.

## 2020-07-19 NOTE — Telephone Encounter (Signed)
Noted. Can we find out what vaccine this was and how many doses he should have received to complete the series?

## 2020-07-20 LAB — AFP TUMOR MARKER: AFP, Serum, Tumor Marker: 1.9 ng/mL (ref 0.0–8.3)

## 2020-07-20 NOTE — Telephone Encounter (Signed)
[employer] and spoke to [place].  She informed me that pt received Hep B Infanrix first series only.  He never came back there for the other ones.  She also informed me that she accidentally told Elmo Putt wrong yesterday.   She overlooked it but pt did receive Hep A Havrix 1440 on 08/05/2017 as well.

## 2020-07-21 ENCOUNTER — Other Ambulatory Visit: Payer: Self-pay

## 2020-07-21 DIAGNOSIS — K7581 Nonalcoholic steatohepatitis (NASH): Secondary | ICD-10-CM

## 2020-07-21 DIAGNOSIS — K7400 Hepatic fibrosis, unspecified: Secondary | ICD-10-CM

## 2020-07-21 NOTE — Telephone Encounter (Signed)
I am not familiar with this Hep B vaccine. As it has been 3 years since he received his first vaccine, I would like to check Hep B surface Ab and Hep A Ab total to see where we stand on immunity. If he doesn't have immunity, we will just restart the series.   Please arrange Hep B surface antibody and Hep A antibody total. Dx: NASH, liver fibrosis.

## 2020-07-21 NOTE — Telephone Encounter (Signed)
Lmom for pt to call us back.

## 2020-07-22 ENCOUNTER — Encounter: Payer: Self-pay | Admitting: Pulmonary Disease

## 2020-07-22 ENCOUNTER — Ambulatory Visit: Payer: PPO | Admitting: Internal Medicine

## 2020-07-22 ENCOUNTER — Ambulatory Visit (HOSPITAL_COMMUNITY)
Admission: RE | Admit: 2020-07-22 | Discharge: 2020-07-22 | Disposition: A | Discharge status: Home / Self Care | Payer: HMO | Source: Ambulatory Visit | Attending: Pulmonary Disease | Admitting: Pulmonary Disease

## 2020-07-22 ENCOUNTER — Other Ambulatory Visit (HOSPITAL_COMMUNITY)
Admission: RE | Admit: 2020-07-22 | Discharge: 2020-07-22 | Disposition: A | Discharge status: Home / Self Care | Payer: HMO | Source: Ambulatory Visit | Attending: Gastroenterology | Admitting: Gastroenterology

## 2020-07-22 ENCOUNTER — Other Ambulatory Visit: Payer: Self-pay

## 2020-07-22 ENCOUNTER — Ambulatory Visit: Payer: HMO | Admitting: Pulmonary Disease

## 2020-07-22 DIAGNOSIS — K7581 Nonalcoholic steatohepatitis (NASH): Secondary | ICD-10-CM | POA: Diagnosis not present

## 2020-07-22 DIAGNOSIS — J181 Lobar pneumonia, unspecified organism: Secondary | ICD-10-CM

## 2020-07-22 DIAGNOSIS — J449 Chronic obstructive pulmonary disease, unspecified: Secondary | ICD-10-CM

## 2020-07-22 DIAGNOSIS — Z8701 Personal history of pneumonia (recurrent): Secondary | ICD-10-CM | POA: Diagnosis not present

## 2020-07-22 DIAGNOSIS — J9611 Chronic respiratory failure with hypoxia: Secondary | ICD-10-CM | POA: Diagnosis not present

## 2020-07-22 DIAGNOSIS — J8410 Pulmonary fibrosis, unspecified: Secondary | ICD-10-CM | POA: Diagnosis not present

## 2020-07-22 LAB — COMPREHENSIVE METABOLIC PANEL
ALT: 29 U/L (ref 0–44)
AST: 24 U/L (ref 15–41)
Albumin: 4.3 g/dL (ref 3.5–5.0)
Alkaline Phosphatase: 97 U/L (ref 38–126)
Anion gap: 7 (ref 5–15)
BUN: 11 mg/dL (ref 8–23)
CO2: 29 mmol/L (ref 22–32)
Calcium: 9.1 mg/dL (ref 8.9–10.3)
Chloride: 102 mmol/L (ref 98–111)
Creatinine, Ser: 0.83 mg/dL (ref 0.61–1.24)
GFR, Estimated: >60 mL/min (ref >60)
Glucose, Bld: 128 mg/dL — ABNORMAL HIGH (ref 70–99)
Potassium: 4.1 mmol/L (ref 3.5–5.1)
Sodium: 138 mmol/L (ref 135–145)
Total Bilirubin: 0.9 mg/dL (ref 0.3–1.2)
Total Protein: 8.2 g/dL — ABNORMAL HIGH (ref 6.5–8.1)

## 2020-07-22 NOTE — Assessment & Plan Note (Addendum)
Follow-up chest x-ray today. Review of serial imaging shows some haziness on a CT scan in October, followed by right lower lobe faint infiltrate during November hospitalization and more prominent infiltrate in December.  Unclear why he keeps getting recurrent pneumonias aspiration would be a concern based on his history of choking.  EGD has been negative in the past. Will schedule swallow study with therapist to rule out aspiration  Addendum -chest x-ray independently reviewed, right lower lobe consolidation has resolved

## 2020-07-22 NOTE — Progress Notes (Signed)
Subjective:    Patient ID: [name], male    DOB: [birth year], 65 y.o.   MRN: [id number]  HPI  65 year old ex-smoker with severe COPD and chronic hypoxic respiratory failure on 3 L O2 since 2019, quit smoking 2012. Patient of Dr. Melvyn Novas  PMH- NASH, diabetes type 2, chronic thrombocytopenia   Chief Complaint  Patient presents with  . Hospital followup    Recent admission to [facility] for pna- d/c on 06/13/21. He states breathing has improved some but no back to baseline for him. He is using his albuterol inhaler about 2 x per wk and has not had to use his neb x 2 wks.    He was hospitalized 05/23/2020 for right lower lobe community-acquired pneumonia, treated with antibiotics. He was again hospitalized 12/3-12/6 for acute respiratory failure, COPD exacerbation, chest x-ray also showed right lower lobe pneumonia.  He required BiPAP continuously for 2 days, "had the best sleep" question was raised whether he needs home BiPAP but did not qualify. Follow-up chest x-ray 12/11 at home showed persistent pneumonia so was given more antibiotics. He now feels 90% improved, continues to have a wet cough with clear sputum.  He reports recurrent pneumonias 6-7 times so far, twice last year.  He reports choking occasionally on his food  Epworth sleepiness score was 8. Reports bedtime between 9 and 10 PM, wakes up 2-3 times during night, sleeps on his side with 3 pillows, finally out of bed by 5 AM.  Wife has noted snoring, occasional witnessed apneas.  He also reports leg cramps that will wake him up on occasion    Significant tests/ events reviewed  alpha one AT screen  01/31/17  MM  Level 169 - PFT's  06/18/2019  FEV1 1.12 (35 % ) ratio 0.46  p 0 % improvement from saba p ? prior to study with DLCO  11.91 (48%) corrects to 3.18 (754%)  for alv volume   CT angio chest 04/2020 >> Nondisplaced posterior right ninth rib fracture. Slight interval improvement in previously visualized ground-glass  nodularity within the right middle lobe. Similar-appearing  ground-glass nodularity within the lingula and peripheral left lower lobe.  EGD 07/2016 portal gastropathy, normal esophagus   Past Medical History:  Diagnosis Date  . Anxiety   . Asthma   . Chest pain    a. 2003 Cath: nl cors.  [name] COPD (chronic obstructive pulmonary disease) ([place])   . Depression   . Diabetes mellitus   . Dyslipidemia   . Dysrhythmia   . Fracture 12/15   lower back "L-1"  . GERD (gastroesophageal reflux disease)   . Hypertension   . NASH (nonalcoholic steatohepatitis) 2018   [name], undergoing routine cirrhosis care  . PONV (postoperative nausea and vomiting)   . Splenomegaly      Review of Systems neg for any significant sore throat, dysphagia, itching, sneezing, nasal congestion or excess/ purulent secretions, fever, chills, sweats, unintended wt loss, pleuritic or exertional cp, hempoptysis, orthopnea pnd or change in chronic leg swelling. Also denies presyncope, palpitations, heartburn, abdominal pain, nausea, vomiting, diarrhea or change in bowel or urinary habits, dysuria,hematuria, rash, arthralgias, visual complaints, headache, numbness weakness or ataxia.     Objective:   Physical Exam  Gen. Pleasant, well-nourished, in no distress, anxious affect, noted to ENT - no thrush, no pallor/icterus,no post nasal drip Neck: No JVD, no thyromegaly, no carotid bruits Lungs: no use of accessory muscles, no dullness to percussion, right basal rales no rhonchi  Cardiovascular: Rhythm regular, heart sounds  normal, no murmurs or gallops, no peripheral edema Musculoskeletal: No deformities, no cyanosis or clubbing        Assessment & Plan:

## 2020-07-22 NOTE — Patient Instructions (Signed)
  Chest x-ray today to see if pneumonia is resolved.  Swallow evaluation to check why you keep getting recurrent pneumonia  Schedule split-night sleep study at the hospital to see if you will qualify for PAP therapy  Continue on Symbicort and Spiriva meantime

## 2020-07-22 NOTE — Assessment & Plan Note (Signed)
Continue Symbicort and Spiriva

## 2020-07-22 NOTE — Assessment & Plan Note (Signed)
He will continue on 3 L pulsed oxygen.  Oxygenation seems back to baseline.  He is 90% improved after his current pneumonia. His serum bicarbonate 25-26 range, no ABG available.  I do not think he will qualify for BiPAP with a diagnosis of COPD.  We may get need an ABG to further investigate this. He does have witnessed apneas and history suggestive of OSA.  We will schedule a split study and see if we can get him PAP on that basis

## 2020-07-24 DIAGNOSIS — J449 Chronic obstructive pulmonary disease, unspecified: Secondary | ICD-10-CM | POA: Diagnosis not present

## 2020-07-25 NOTE — Progress Notes (Signed)
Spoke with pt and notified of results per Dr. [name]. Pt verbalized understanding and denied any questions. 

## 2020-07-26 ENCOUNTER — Other Ambulatory Visit: Payer: Self-pay

## 2020-07-26 DIAGNOSIS — J449 Chronic obstructive pulmonary disease, unspecified: Secondary | ICD-10-CM

## 2020-07-27 ENCOUNTER — Other Ambulatory Visit: Payer: Self-pay | Admitting: *Deleted

## 2020-07-27 DIAGNOSIS — K7400 Hepatic fibrosis, unspecified: Secondary | ICD-10-CM

## 2020-07-27 DIAGNOSIS — K7581 Nonalcoholic steatohepatitis (NASH): Secondary | ICD-10-CM

## 2020-07-27 NOTE — Telephone Encounter (Signed)
Called [name] and informed him that we would like to arrange Hep B surface antibody and Hep A antibody total to be done.  [name] voiced understanding and requested to have it done on 08/04/2020 at [facility].  Faxed lab order to [facility].

## 2020-07-29 ENCOUNTER — Other Ambulatory Visit (HOSPITAL_COMMUNITY): Payer: Self-pay

## 2020-08-01 ENCOUNTER — Ambulatory Visit: Payer: HMO | Attending: Pulmonary Disease | Admitting: Pulmonary Disease

## 2020-08-01 ENCOUNTER — Other Ambulatory Visit: Payer: Self-pay

## 2020-08-01 DIAGNOSIS — R0683 Snoring: Secondary | ICD-10-CM | POA: Insufficient documentation

## 2020-08-01 DIAGNOSIS — J9611 Chronic respiratory failure with hypoxia: Secondary | ICD-10-CM

## 2020-08-01 DIAGNOSIS — R0902 Hypoxemia: Secondary | ICD-10-CM | POA: Insufficient documentation

## 2020-08-03 ENCOUNTER — Other Ambulatory Visit (HOSPITAL_COMMUNITY): Payer: Self-pay | Admitting: Specialist

## 2020-08-03 DIAGNOSIS — R1319 Other dysphagia: Secondary | ICD-10-CM

## 2020-08-03 DIAGNOSIS — T17908S Unspecified foreign body in respiratory tract, part unspecified causing other injury, sequela: Secondary | ICD-10-CM

## 2020-08-05 ENCOUNTER — Encounter (HOSPITAL_COMMUNITY): Payer: Self-pay | Admitting: Speech Pathology

## 2020-08-05 ENCOUNTER — Ambulatory Visit (HOSPITAL_COMMUNITY)
Admission: RE | Admit: 2020-08-05 | Discharge: 2020-08-05 | Disposition: A | Discharge status: Home / Self Care | Payer: HMO | Source: Ambulatory Visit | Attending: Pulmonary Disease | Admitting: Pulmonary Disease

## 2020-08-05 ENCOUNTER — Other Ambulatory Visit: Payer: Self-pay

## 2020-08-05 ENCOUNTER — Ambulatory Visit (HOSPITAL_COMMUNITY): Payer: HMO | Attending: Pulmonary Disease | Admitting: Speech Pathology

## 2020-08-05 DIAGNOSIS — T17908S Unspecified foreign body in respiratory tract, part unspecified causing other injury, sequela: Secondary | ICD-10-CM

## 2020-08-05 DIAGNOSIS — R1319 Other dysphagia: Secondary | ICD-10-CM

## 2020-08-05 DIAGNOSIS — T17900A Unspecified foreign body in respiratory tract, part unspecified causing asphyxiation, initial encounter: Secondary | ICD-10-CM | POA: Diagnosis not present

## 2020-08-05 DIAGNOSIS — R1310 Dysphagia, unspecified: Secondary | ICD-10-CM | POA: Diagnosis not present

## 2020-08-05 NOTE — Therapy (Signed)
[name], [place], [address] Phone: [phone]   Fax:  [phone]  Modified Barium Swallow  Patient Details  Name: [name] MRN: [id number] Date of Birth: [birth year] No data recorded  Encounter Date: 08/05/2020   End of Session - 08/05/20 1205    Visit Number 1    Number of Visits 1    Activity Tolerance Patient tolerated treatment well              Past Medical History:  Past Medical History:  Diagnosis Date  . Anxiety   . Asthma   . Chest pain    a. 2003 Cath: nl cors.  [name] COPD (chronic obstructive pulmonary disease) ([place])   . Depression   . Diabetes mellitus   . Dyslipidemia   . Dysrhythmia   . Fracture 12/15   lower back "L-1"  . GERD (gastroesophageal reflux disease)   . Hypertension   . NASH (nonalcoholic steatohepatitis) 2018   [name], undergoing routine cirrhosis care  . PONV (postoperative nausea and vomiting)   . Splenomegaly    Past Surgical History:  Past Surgical History:  Procedure Laterality Date  . BIOPSY  07/26/2016   Procedure: BIOPSY;  Surgeon: [name], MD;  [facility]: [facility];  Service: Endoscopy;;  gastric  . CARDIAC CATHETERIZATION  10/22/2001   normal coronary arteriea (Dr. Domenic Moras)  . COLONOSCOPY  08/31/2011   FIE:PPIRJJOA rectal and colon polyps-treated. Single tubular adenoma. Next TCS 08/2016  . COLONOSCOPY N/A 07/26/2016   Dr. [name]: diverticulosis in sigmoid and descending colon. 6 mm benign splenic flexure. surveillance 5 yeras  . ESOPHAGOGASTRODUODENOSCOPY N/A 07/26/2016   Dr. [name]: normal esophagus, portal gastropathy, chronic gastritis, normal duodenum, screening EGD 2 years   . ESOPHAGOGASTRODUODENOSCOPY (EGD) WITH ESOPHAGEAL DILATION N/A 05/15/2013   CZY:SAYTKZ esophagus-status post Venia Minks dilation/Portal gastropathy. Antral erosions-status post biopsy. extrinsic compression along lesser curvature likely secondary to splenomegaly.  gastric bx benign  . HERNIA REPAIR  03/2009  . POLYPECTOMY  07/26/2016   Procedure: POLYPECTOMY;  Surgeon: [name], MD;  Location: [facility];  Service: Endoscopy;;  colon  . TRANSTHORACIC ECHOCARDIOGRAM  2011   EF 60-10%, stage 1 diastolic dysfunction, trace TR & pulm valve regurg   . VASECTOMY  444   HPI: 65 year old male with biopsy-proven NASH in 2018, elastography with F2/F3 chronic thrombocytopenia following with hematology, splenomegaly, and portal gastropathy who has been undergoing routine cirrhosis monitoring.  Last EGD in 2018 with portal gastropathy with recommendations to repeat in 2020.  Most recent abdominal imaging includes CT A/P with contrast October 2021 with normal-appearing liver.  Recent labs during hospitalizations (pneumonia/COPD exacerbation) in November and December with platelets in the 80-90 range, LFTs WNL, T bili slightly elevated at 1.4. He has had recurring PNA and reports of choking episodes. MBSS ordered to objectively assess the swallowing function.   Subjective: "I do have reflux."    Assessment / Plan / Recommendation  CHL IP CLINICAL IMPRESSIONS 08/05/2020  Clinical Impression Pt's oropharyngeal swallowing was noted to be within functional limits today. Pt did demonstrate two episodes of flash penetration (one episode of flash frank penetration) when drinking thin liquids with a straw; however this is likely a normal difference in swallowing. Pt demonstrates adequate hyolaryngeal excursion, laryngeal vestibule closure and good airway protection. Pt reports occasional episodes of emesis and globus sensation, SLP reviewed esophageal precautions and provided education of universal aspiration precautions. Note it is possible  that Pt experiences episodic aspiration or aspiration during episodes of emesis (related to GERD). Recommend Pt continue with current diet with implementation of esophageal precations and there are no further ST needs noted at this time,  defer to GI to further recommend and treat esophageal related difficulties.  SLP Visit Diagnosis Dysphagia, unspecified (R13.10)  Attention and concentration deficit following --  Frontal lobe and executive function deficit following --  Impact on safety and function Mild aspiration risk      CHL IP TREATMENT RECOMMENDATION 08/05/2020  Treatment Recommendations No treatment recommended at this time     Prognosis 05/24/2020  Prognosis for Safe Diet Advancement Good  Barriers to Reach Goals --  Barriers/Prognosis Comment --    CHL IP DIET RECOMMENDATION 08/05/2020  SLP Diet Recommendations Regular solids;Thin liquid  Liquid Administration via Straw;Cup  Medication Administration Whole meds with liquid  Compensations Slow rate;Small sips/bites  Postural Changes Seated upright at 90 degrees;Remain semi-upright after after feeds/meals (Comment)      CHL IP OTHER RECOMMENDATIONS 08/05/2020  Recommended Consults --  Oral Care Recommendations Oral care BID  Other Recommendations --      CHL IP FOLLOW UP RECOMMENDATIONS 05/24/2020  Follow up Recommendations None      No flowsheet data found.         CHL IP ORAL PHASE 08/05/2020  Oral Phase WFL  Oral - Pudding Teaspoon --  Oral - Pudding Cup --  Oral - Honey Teaspoon --  Oral - Honey Cup --  Oral - Nectar Teaspoon --  Oral - Nectar Cup --  Oral - Nectar Straw --  Oral - Thin Teaspoon --  Oral - Thin Cup --  Oral - Thin Straw --  Oral - Puree --  Oral - Mech Soft --  Oral - Regular --  Oral - Multi-Consistency --  Oral - Pill --  Oral Phase - Comment --    CHL IP PHARYNGEAL PHASE 08/05/2020  Pharyngeal Phase WFL  Pharyngeal- Pudding Teaspoon --  Pharyngeal --  Pharyngeal- Pudding Cup --  Pharyngeal --  Pharyngeal- Honey Teaspoon --  Pharyngeal --  Pharyngeal- Honey Cup --  Pharyngeal --  Pharyngeal- Nectar Teaspoon --  Pharyngeal --  Pharyngeal- Nectar Cup --  Pharyngeal --  Pharyngeal- Nectar Straw --   Pharyngeal --  Pharyngeal- Thin Teaspoon --  Pharyngeal --  Pharyngeal- Thin Cup --  Pharyngeal --  Pharyngeal- Thin Straw --  Pharyngeal --  Pharyngeal- Puree --  Pharyngeal --  Pharyngeal- Mechanical Soft --  Pharyngeal --  Pharyngeal- Regular --  Pharyngeal --  Pharyngeal- Multi-consistency --  Pharyngeal --  Pharyngeal- Pill --  Pharyngeal --  Pharyngeal Comment --     CHL IP CERVICAL ESOPHAGEAL PHASE 08/05/2020  Cervical Esophageal Phase WFL  Pudding Teaspoon --  Pudding Cup --  Honey Teaspoon --  Honey Cup --  Nectar Teaspoon --  Nectar Cup --  Nectar Straw --  Thin Teaspoon --  Thin Cup --  Thin Straw --  Puree --  Mechanical Soft --  Regular --  Multi-consistency --  Pill --  Cervical Esophageal Comment --    Ramisa Duman H. Roddie Mc, CCC-SLP Speech Language Pathologist  Wende Bushy 08/05/2020, 12:06 PM         Patient will benefit from skilled therapeutic intervention in order to improve the following deficits and impairments:   No diagnosis found.        Wende Bushy 08/05/2020, 12:05 PM  [place]  [facility] [address] [place], [place], [address] Phone: [phone]   Fax:  [phone]  Name: CASPER PAGLIUCA MRN: [id number] Date of Birth: 08/31/1955

## 2020-08-09 ENCOUNTER — Telehealth: Payer: Self-pay | Admitting: Pulmonary Disease

## 2020-08-09 NOTE — Telephone Encounter (Signed)
NPSG did not show OSA Did show oxygen desaturations. Continue on oxygen during sleep.

## 2020-08-09 NOTE — Procedures (Signed)
Patient Name: Najeeb, Uptain Date: 08/01/2020 Gender: Male D.O.B: Mar 30, 1956 Age (years): 58 Referring Provider: Kara Mead MD, ABSM Height (inches): 67 Interpreting Physician: Kara Mead MD, ABSM Weight (lbs): 162 RPSGT: Rosebud Poles BMI: 25 MRN: [id number] Neck Size: 16.00 <br> <br> CLINICAL INFORMATION Sleep Study Type: NPSG    Indication for sleep study: loud snoring, daytime somnolence,COPD on oxygen   Epworth Sleepiness Score: 1   SLEEP STUDY TECHNIQUE As per the AASM Manual for the Scoring of Sleep and Associated Events v2.3 (April 2016) with a hypopnea requiring 4% desaturations.  The channels recorded and monitored were frontal, central and occipital EEG, electrooculogram (EOG), submentalis EMG (chin), nasal and oral airflow, thoracic and abdominal wall motion, anterior tibialis EMG, snore microphone, electrocardiogram, and pulse oximetry.  MEDICATIONS Medications self-administered by patient taken the night of the study : N/A  SLEEP ARCHITECTURE The study was initiated at 10:11:10 PM and ended at 4:42:06 AM.  Sleep onset time was 12.3 minutes and the sleep efficiency was 90.3%%. The total sleep time was 353 minutes.  Stage REM latency was 121.0 minutes.  The patient spent 4.1%% of the night in stage N1 sleep, 41.4%% in stage N2 sleep, 21.4%% in stage N3 and 33.1% in REM.  Alpha intrusion was absent.  Supine sleep was 3.82%.  RESPIRATORY PARAMETERS The overall apnea/hypopnea index (AHI) was 0.8 per hour. There were 0 total apneas, including 0 obstructive, 0 central and 0 mixed apneas. There were 5 hypopneas and 9 RERAs.  The AHI during Stage REM sleep was 0.5 per hour.  AHI while supine was 17.8 per hour.  The mean oxygen saturation was 87.8%. The minimum SpO2 during sleep was 71.0%. He spent 204 mins of TST with saturation less than 88%  moderate snoring was noted during this study.  CARDIAC DATA The 2 lead EKG demonstrated sinus rhythm. The  mean heart rate was 78.7 beats per minute. Other EKG findings include: None. LEG MOVEMENT DATA The total PLMS were 0 with a resulting PLMS index of 0.0. Associated arousal with leg movement index was 0.3 .  IMPRESSIONS - No significant obstructive sleep apnea occurred during this study (AHI = 0.8/h). Minimal supine sleep was noted. - No significant central sleep apnea occurred during this study (CAI = 0.0/h). - Moderate oxygen desaturation was noted during this study (Min O2 = 71.0%). - The patient snored with moderate snoring volume. - No cardiac abnormalities were noted during this study. - Clinically significant periodic limb movements did not occur during sleep. No significant associated arousals.   DIAGNOSIS - Nocturnal Hypoxemia (G47.36)  RECOMMENDATIONS - Use oxygen during sleep.  - Positional therapy avoiding supine position during sleep. - Avoid alcohol, sedatives and other CNS depressants that may worsen sleep apnea and disrupt normal sleep architecture. - Sleep hygiene should be reviewed to assess factors that may improve sleep quality. - Weight management and regular exercise should be initiated or continued if appropriate.    Kara Mead MD Board Certified in [place]

## 2020-08-10 ENCOUNTER — Other Ambulatory Visit (HOSPITAL_COMMUNITY): Payer: Self-pay | Admitting: *Deleted

## 2020-08-10 DIAGNOSIS — D696 Thrombocytopenia, unspecified: Secondary | ICD-10-CM

## 2020-08-10 NOTE — Telephone Encounter (Signed)
Called and went over NPSG results per Dr Elsworth Soho with patient. All questions answered and patient expressed full understanding to continue on oxygen during sleep. Confirmed upcoming scheduled office visit with Dr Melvyn Novas on 08/19/2020.  Nothing further needed at this time.

## 2020-08-11 ENCOUNTER — Other Ambulatory Visit: Payer: Self-pay

## 2020-08-11 ENCOUNTER — Inpatient Hospital Stay (HOSPITAL_COMMUNITY): Payer: HMO | Attending: Hematology

## 2020-08-11 DIAGNOSIS — K219 Gastro-esophageal reflux disease without esophagitis: Secondary | ICD-10-CM | POA: Insufficient documentation

## 2020-08-11 DIAGNOSIS — R059 Cough, unspecified: Secondary | ICD-10-CM | POA: Diagnosis not present

## 2020-08-11 DIAGNOSIS — E119 Type 2 diabetes mellitus without complications: Secondary | ICD-10-CM | POA: Insufficient documentation

## 2020-08-11 DIAGNOSIS — F419 Anxiety disorder, unspecified: Secondary | ICD-10-CM | POA: Insufficient documentation

## 2020-08-11 DIAGNOSIS — Z7984 Long term (current) use of oral hypoglycemic drugs: Secondary | ICD-10-CM | POA: Insufficient documentation

## 2020-08-11 DIAGNOSIS — Z79899 Other long term (current) drug therapy: Secondary | ICD-10-CM | POA: Diagnosis not present

## 2020-08-11 DIAGNOSIS — Z7982 Long term (current) use of aspirin: Secondary | ICD-10-CM | POA: Diagnosis not present

## 2020-08-11 DIAGNOSIS — Z8249 Family history of ischemic heart disease and other diseases of the circulatory system: Secondary | ICD-10-CM | POA: Insufficient documentation

## 2020-08-11 DIAGNOSIS — Z8261 Family history of arthritis: Secondary | ICD-10-CM | POA: Insufficient documentation

## 2020-08-11 DIAGNOSIS — Z833 Family history of diabetes mellitus: Secondary | ICD-10-CM | POA: Diagnosis not present

## 2020-08-11 DIAGNOSIS — Z8041 Family history of malignant neoplasm of ovary: Secondary | ICD-10-CM | POA: Insufficient documentation

## 2020-08-11 DIAGNOSIS — F32A Depression, unspecified: Secondary | ICD-10-CM | POA: Insufficient documentation

## 2020-08-11 DIAGNOSIS — R918 Other nonspecific abnormal finding of lung field: Secondary | ICD-10-CM | POA: Diagnosis not present

## 2020-08-11 DIAGNOSIS — Z87891 Personal history of nicotine dependence: Secondary | ICD-10-CM | POA: Insufficient documentation

## 2020-08-11 DIAGNOSIS — K7581 Nonalcoholic steatohepatitis (NASH): Secondary | ICD-10-CM | POA: Insufficient documentation

## 2020-08-11 DIAGNOSIS — D696 Thrombocytopenia, unspecified: Secondary | ICD-10-CM | POA: Insufficient documentation

## 2020-08-11 DIAGNOSIS — I10 Essential (primary) hypertension: Secondary | ICD-10-CM | POA: Insufficient documentation

## 2020-08-11 DIAGNOSIS — E785 Hyperlipidemia, unspecified: Secondary | ICD-10-CM | POA: Insufficient documentation

## 2020-08-11 DIAGNOSIS — Z7951 Long term (current) use of inhaled steroids: Secondary | ICD-10-CM | POA: Diagnosis not present

## 2020-08-11 LAB — CBC WITH DIFFERENTIAL/PLATELET
Abs Immature Granulocytes: 0.03 10*3/uL (ref 0.00–0.07)
Basophils Absolute: 0.1 10*3/uL (ref 0.0–0.1)
Basophils Relative: 1 %
Eosinophils Absolute: 0.6 10*3/uL — ABNORMAL HIGH (ref 0.0–0.5)
Eosinophils Relative: 11 %
HCT: 50.3 % (ref 39.0–52.0)
Hemoglobin: 15.5 g/dL (ref 13.0–17.0)
Immature Granulocytes: 1 %
Lymphocytes Relative: 29 %
Lymphs Abs: 1.6 10*3/uL (ref 0.7–4.0)
MCH: 26 pg (ref 26.0–34.0)
MCHC: 30.8 g/dL (ref 30.0–36.0)
MCV: 84.4 fL (ref 80.0–100.0)
Monocytes Absolute: 0.4 10*3/uL (ref 0.1–1.0)
Monocytes Relative: 7 %
Neutro Abs: 2.9 10*3/uL (ref 1.7–7.7)
Neutrophils Relative %: 51 %
Platelets: 101 10*3/uL — ABNORMAL LOW (ref 150–400)
RBC: 5.96 MIL/uL — ABNORMAL HIGH (ref 4.22–5.81)
RDW: 17.8 % — ABNORMAL HIGH (ref 11.5–15.5)
WBC: 5.6 10*3/uL (ref 4.0–10.5)
nRBC: 0 % (ref 0.0–0.2)

## 2020-08-11 LAB — COMPREHENSIVE METABOLIC PANEL
ALT: 29 U/L (ref 0–44)
AST: 21 U/L (ref 15–41)
Albumin: 4.4 g/dL (ref 3.5–5.0)
Alkaline Phosphatase: 103 U/L (ref 38–126)
Anion gap: 8 (ref 5–15)
BUN: 24 mg/dL — ABNORMAL HIGH (ref 8–23)
CO2: 29 mmol/L (ref 22–32)
Calcium: 9.1 mg/dL (ref 8.9–10.3)
Chloride: 101 mmol/L (ref 98–111)
Creatinine, Ser: 0.87 mg/dL (ref 0.61–1.24)
GFR, Estimated: >60 mL/min (ref >60)
Glucose, Bld: 152 mg/dL — ABNORMAL HIGH (ref 70–99)
Potassium: 4.6 mmol/L (ref 3.5–5.1)
Sodium: 138 mmol/L (ref 135–145)
Total Bilirubin: 1.1 mg/dL (ref 0.3–1.2)
Total Protein: 8.5 g/dL — ABNORMAL HIGH (ref 6.5–8.1)

## 2020-08-11 LAB — VITAMIN B12: Vitamin B-12: 621 pg/mL (ref 180–914)

## 2020-08-11 LAB — LACTATE DEHYDROGENASE: LDH: 124 U/L (ref 98–192)

## 2020-08-11 LAB — VITAMIN D 25 HYDROXY (VIT D DEFICIENCY, FRACTURES): Vit D, 25-Hydroxy: 48.68 ng/mL (ref 30–100)

## 2020-08-12 ENCOUNTER — Ambulatory Visit (HOSPITAL_COMMUNITY): Admit: 2020-08-12 | Payer: PPO

## 2020-08-12 ENCOUNTER — Encounter (HOSPITAL_COMMUNITY): Payer: Self-pay

## 2020-08-12 SURGERY — COLONOSCOPY WITH PROPOFOL
Anesthesia: Monitor Anesthesia Care

## 2020-08-15 ENCOUNTER — Telehealth: Payer: Self-pay

## 2020-08-15 NOTE — Telephone Encounter (Signed)
Rec'd incoming records from [facility]  forwarded 4 pages to Dr. Christinia Gully

## 2020-08-16 ENCOUNTER — Inpatient Hospital Stay (HOSPITAL_BASED_OUTPATIENT_CLINIC_OR_DEPARTMENT_OTHER): Payer: HMO | Admitting: Oncology

## 2020-08-16 ENCOUNTER — Other Ambulatory Visit: Payer: Self-pay

## 2020-08-16 ENCOUNTER — Encounter (HOSPITAL_COMMUNITY): Payer: Self-pay | Admitting: Oncology

## 2020-08-16 VITALS — BP 110/71 | HR 100 | Temp 97.9°F | Resp 20 | Wt 165.8 lb

## 2020-08-16 DIAGNOSIS — K219 Gastro-esophageal reflux disease without esophagitis: Secondary | ICD-10-CM | POA: Diagnosis present

## 2020-08-16 DIAGNOSIS — Z8701 Personal history of pneumonia (recurrent): Secondary | ICD-10-CM | POA: Diagnosis not present

## 2020-08-16 DIAGNOSIS — E785 Hyperlipidemia, unspecified: Secondary | ICD-10-CM | POA: Diagnosis present

## 2020-08-16 DIAGNOSIS — R000 Tachycardia, unspecified: Secondary | ICD-10-CM | POA: Diagnosis not present

## 2020-08-16 DIAGNOSIS — Z7982 Long term (current) use of aspirin: Secondary | ICD-10-CM | POA: Diagnosis not present

## 2020-08-16 DIAGNOSIS — A419 Sepsis, unspecified organism: Secondary | ICD-10-CM | POA: Diagnosis not present

## 2020-08-16 DIAGNOSIS — J811 Chronic pulmonary edema: Secondary | ICD-10-CM | POA: Diagnosis not present

## 2020-08-16 DIAGNOSIS — I10 Essential (primary) hypertension: Secondary | ICD-10-CM | POA: Diagnosis present

## 2020-08-16 DIAGNOSIS — Z8041 Family history of malignant neoplasm of ovary: Secondary | ICD-10-CM | POA: Diagnosis not present

## 2020-08-16 DIAGNOSIS — D696 Thrombocytopenia, unspecified: Secondary | ICD-10-CM | POA: Diagnosis not present

## 2020-08-16 DIAGNOSIS — N179 Acute kidney failure, unspecified: Secondary | ICD-10-CM | POA: Diagnosis present

## 2020-08-16 DIAGNOSIS — E119 Type 2 diabetes mellitus without complications: Secondary | ICD-10-CM | POA: Diagnosis present

## 2020-08-16 DIAGNOSIS — J181 Lobar pneumonia, unspecified organism: Secondary | ICD-10-CM | POA: Diagnosis not present

## 2020-08-16 DIAGNOSIS — J440 Chronic obstructive pulmonary disease with acute lower respiratory infection: Secondary | ICD-10-CM | POA: Diagnosis present

## 2020-08-16 DIAGNOSIS — J90 Pleural effusion, not elsewhere classified: Secondary | ICD-10-CM | POA: Diagnosis not present

## 2020-08-16 DIAGNOSIS — R531 Weakness: Secondary | ICD-10-CM | POA: Diagnosis not present

## 2020-08-16 DIAGNOSIS — Z8709 Personal history of other diseases of the respiratory system: Secondary | ICD-10-CM | POA: Diagnosis not present

## 2020-08-16 DIAGNOSIS — S2231XA Fracture of one rib, right side, initial encounter for closed fracture: Secondary | ICD-10-CM | POA: Diagnosis not present

## 2020-08-16 DIAGNOSIS — R6521 Severe sepsis with septic shock: Secondary | ICD-10-CM | POA: Diagnosis present

## 2020-08-16 DIAGNOSIS — R911 Solitary pulmonary nodule: Secondary | ICD-10-CM | POA: Diagnosis not present

## 2020-08-16 DIAGNOSIS — R0689 Other abnormalities of breathing: Secondary | ICD-10-CM | POA: Diagnosis not present

## 2020-08-16 DIAGNOSIS — R0602 Shortness of breath: Secondary | ICD-10-CM | POA: Diagnosis present

## 2020-08-16 DIAGNOSIS — R069 Unspecified abnormalities of breathing: Secondary | ICD-10-CM | POA: Diagnosis not present

## 2020-08-16 DIAGNOSIS — K7460 Unspecified cirrhosis of liver: Secondary | ICD-10-CM | POA: Diagnosis present

## 2020-08-16 DIAGNOSIS — Z7984 Long term (current) use of oral hypoglycemic drugs: Secondary | ICD-10-CM | POA: Diagnosis not present

## 2020-08-16 DIAGNOSIS — F419 Anxiety disorder, unspecified: Secondary | ICD-10-CM | POA: Diagnosis present

## 2020-08-16 DIAGNOSIS — J690 Pneumonitis due to inhalation of food and vomit: Secondary | ICD-10-CM | POA: Diagnosis present

## 2020-08-16 DIAGNOSIS — F32A Depression, unspecified: Secondary | ICD-10-CM | POA: Diagnosis present

## 2020-08-16 DIAGNOSIS — K7581 Nonalcoholic steatohepatitis (NASH): Secondary | ICD-10-CM | POA: Diagnosis present

## 2020-08-16 DIAGNOSIS — Z833 Family history of diabetes mellitus: Secondary | ICD-10-CM | POA: Diagnosis not present

## 2020-08-16 DIAGNOSIS — J449 Chronic obstructive pulmonary disease, unspecified: Secondary | ICD-10-CM | POA: Diagnosis not present

## 2020-08-16 DIAGNOSIS — Z87891 Personal history of nicotine dependence: Secondary | ICD-10-CM | POA: Diagnosis not present

## 2020-08-16 DIAGNOSIS — J9621 Acute and chronic respiratory failure with hypoxia: Secondary | ICD-10-CM | POA: Diagnosis present

## 2020-08-16 DIAGNOSIS — Z20822 Contact with and (suspected) exposure to covid-19: Secondary | ICD-10-CM | POA: Diagnosis present

## 2020-08-16 DIAGNOSIS — J189 Pneumonia, unspecified organism: Secondary | ICD-10-CM | POA: Diagnosis not present

## 2020-08-16 DIAGNOSIS — K7400 Hepatic fibrosis, unspecified: Secondary | ICD-10-CM | POA: Diagnosis present

## 2020-08-16 DIAGNOSIS — R0902 Hypoxemia: Secondary | ICD-10-CM | POA: Diagnosis not present

## 2020-08-16 DIAGNOSIS — Z8249 Family history of ischemic heart disease and other diseases of the circulatory system: Secondary | ICD-10-CM | POA: Diagnosis not present

## 2020-08-16 DIAGNOSIS — E876 Hypokalemia: Secondary | ICD-10-CM | POA: Diagnosis not present

## 2020-08-16 NOTE — Progress Notes (Signed)
[place], [place] [address]   CLINIC:  Medical Oncology/Hematology  PCP:  Asencion Noble, MD [address] [place] [address] [phone]   REASON FOR VISIT: Follow-up for thrombocytopenia   CURRENT THERAPY: Observation   INTERVAL HISTORY:  [name] 65 y.o. male returns for routine follow-up for thrombocytopenia.  He was last seen in clinic on 02/09/2020.  In the interim, he has been hospitalized on several occasions d/t cough, COPD exacerbation and pneumonia.  CT scan from 04/17/20 additionally showed posterior lower rib fracture.  He was treated with oral and IV antibiotics and sent home on all occasions  Since his last hospitalization, he reports he is doing well.  He is on chronic 2 L oxygen.  Rib pain has improved.  He denies any additional pneumonia, fevers, cough or acute shortness of breath.  He overall feels better.  He recently had a swallow study for some chronic mild dysphagia.  No new recommendations were offered.   REVIEW OF SYSTEMS:  Review of Systems  Constitutional: Positive for fatigue.  Respiratory: Positive for cough and shortness of breath.      PAST MEDICAL/SURGICAL HISTORY:  Past Medical History:  Diagnosis Date  . Anxiety   . Asthma   . Chest pain    a. 2003 Cath: nl cors.  [name] COPD (chronic obstructive pulmonary disease) ([facility])   . Depression   . Diabetes mellitus   . Dyslipidemia   . Dysrhythmia   . Fracture 12/15   lower back "L-1"  . GERD (gastroesophageal reflux disease)   . Hypertension   . NASH (nonalcoholic steatohepatitis) 2018   [name], undergoing routine cirrhosis care  . PONV (postoperative nausea and vomiting)   . Splenomegaly    Past Surgical History:  Procedure Laterality Date  . BIOPSY  07/26/2016   Procedure: BIOPSY;  Surgeon: [name], MD;  Location: [facility];  Service: Endoscopy;;  gastric  . CARDIAC CATHETERIZATION  10/22/2001   normal coronary arteriea  (Dr. Domenic Moras)  . COLONOSCOPY  08/31/2011   LSL:[id number] rectal and colon polyps-treated. Single tubular adenoma. Next TCS 08/2016  . COLONOSCOPY N/A 07/26/2016   Dr. [name]: diverticulosis in sigmoid and descending colon. 6 mm benign splenic flexure. surveillance 5 yeras  . ESOPHAGOGASTRODUODENOSCOPY N/A 07/26/2016   Dr. [name]: normal esophagus, portal gastropathy, chronic gastritis, normal duodenum, screening EGD 2 years   . ESOPHAGOGASTRODUODENOSCOPY (EGD) WITH ESOPHAGEAL DILATION N/A 05/15/2013   OTL:XBWIOM esophagus-status post Venia Minks dilation/Portal gastropathy. Antral erosions-status post biopsy. extrinsic compression along lesser curvature likely secondary to splenomegaly. gastric bx benign  . HERNIA REPAIR  03/2009  . POLYPECTOMY  07/26/2016   Procedure: POLYPECTOMY;  Surgeon: [name], MD;  Location: [facility];  Service: Endoscopy;;  colon  . TRANSTHORACIC ECHOCARDIOGRAM  2011   EF 35-59%, stage 1 diastolic dysfunction, trace TR & pulm valve regurg   . VASECTOMY  1989     SOCIAL HISTORY:  Social History   Socioeconomic History  . Marital status: Married    Spouse name: Not on file  . Number of children: Not on file  . Years of education: Not on file  . Highest education level: Not on file  Occupational History  . Not on file  Tobacco Use  . Smoking status: Former Smoker    Packs/day: 1.25    Years: 36.00    Pack years: 45.00    Types: Cigarettes    Quit date: 04/2011  Years since quitting: 9.3  . Smokeless tobacco: Former Network engineer  . Vaping Use: Never used  Substance and Sexual Activity  . Alcohol use: No  . Drug use: No  . Sexual activity: Not on file  Other Topics Concern  . Not on file  Social History Narrative  . Not on file   Social Determinants of Health   Financial Resource Strain: Low Risk   . Difficulty of Paying Living Expenses: Not hard at all  Food Insecurity: No Food Insecurity  . Worried About Charity fundraiser in the Last  Year: Never true  . Ran Out of Food in the Last Year: Never true  Transportation Needs: No Transportation Needs  . Lack of Transportation (Medical): No  . Lack of Transportation (Non-Medical): No  Physical Activity: Sufficiently Active  . Days of Exercise per Week: 7 days  . Minutes of Exercise per Session: 30 min  Stress: No Stress Concern Present  . Feeling of Stress : Not at all  Social Connections: Moderately Integrated  . Frequency of Communication with Friends and Family: Three times a week  . Frequency of Social Gatherings with Friends and Family: Three times a week  . Attends Religious Services: More than 4 times per year  . Active Member of Clubs or Organizations: No  . Attends Archivist Meetings: Never  . Marital Status: Married  Human resources officer Violence: Not At Risk  . Fear of Current or Ex-Partner: No  . Emotionally Abused: No  . Physically Abused: No  . Sexually Abused: No    FAMILY HISTORY:  Family History  Problem Relation Age of Onset  . Heart disease Mother   . Cancer Mother        lymph nodes  . Diabetes Mother   . Arthritis Other   . Lung disease Other   . Asthma Other   . Kidney disease Other   . Ovarian cancer Sister   . Diabetes Brother   . Heart disease Brother   . Hypertension Brother   . Diabetes Sister   . Diabetes Sister   . Cirrhosis Sister 56       no etoh  . Colon cancer Neg Hx     CURRENT MEDICATIONS:  Outpatient Encounter Medications as of 08/16/2020  Medication Sig  . acetaminophen (TYLENOL) 500 MG tablet Take 1,000 mg by mouth daily as needed for headache.   . albuterol (PROVENTIL) (2.5 MG/3ML) 0.083% nebulizer solution Take 3 mLs (2.5 mg total) by nebulization every 2 (two) hours as needed for wheezing or shortness of breath (cough).  . ALPRAZolam (XANAX) 0.25 MG tablet Take 0.25 mg by mouth 2 (two) times daily as needed for sleep.   [name] aspirin EC 81 MG tablet Take 1 tablet (81 mg total) by mouth daily with breakfast.  .  atorvastatin (LIPITOR) 40 MG tablet Take 1 tablet (40 mg total) by mouth daily.  . budesonide-formoterol (SYMBICORT) 160-4.5 MCG/ACT inhaler Inhale 2 puffs into the lungs 2 (two) times daily.  . [employer] VITAMIN C 250 MG tablet Take 250 mg by mouth daily.  [name] diltiazem (CARDIZEM CD) 240 MG 24 hr capsule Take 1 capsule (240 mg total) by mouth daily.  . furosemide (LASIX) 20 MG tablet Take 20 mg by mouth.  . gabapentin (NEURONTIN) 300 MG capsule Take 1 capsule (300 mg total) by mouth at bedtime.  [name] glimepiride (AMARYL) 2 MG tablet Take 1 tablet (2 mg total) by mouth daily with breakfast. Please do  NOT take if you skip breakfast  . guaiFENesin (MUCINEX) 600 MG 12 hr tablet Take 1 tablet (600 mg total) by mouth 2 (two) times daily. (Patient taking differently: Take 600 mg by mouth 2 (two) times daily as needed.)  . JARDIANCE 25 MG TABS tablet Take 25 mg by mouth every evening.   . Multiple Vitamin (MULTIVITAMIN WITH MINERALS) TABS tablet Take 1 tablet by mouth at bedtime.  Glory Rosebush DELICA LANCETS 26R MISC   . ONETOUCH VERIO test strip   . OXYGEN Inhale 4 L into the lungs. Pt uses oxygen at night and when exerting himself  . pantoprazole (PROTONIX) 40 MG tablet Take 1 tablet (40 mg total) by mouth daily.  . potassium chloride (MICRO-K) 10 MEQ CR capsule Take 10 mEq by mouth daily.  [name] PROAIR HFA 108 (90 Base) MCG/ACT inhaler Inhale 2 puffs into the lungs every 4 (four) hours as needed for wheezing or shortness of breath.  . sertraline (ZOLOFT) 50 MG tablet Take 1 tablet (50 mg total) by mouth daily.  . SitaGLIPtin-MetFORMIN HCl [phone] MG TB24 Take 1 tablet by mouth daily.  . tamsulosin (FLOMAX) 0.4 MG CAPS capsule Take 0.4 mg by mouth daily.  [name] thiamine 100 MG tablet Take 1 tablet (100 mg total) by mouth daily.  . Tiotropium Bromide Monohydrate (SPIRIVA RESPIMAT) 2.5 MCG/ACT AERS Inhale 2 puffs into the lungs daily.  [name] VITAMIN D PO Take 1 tablet by mouth daily.  . [DISCONTINUED] Ascorbic Acid (VITAMIN  C) 100 MG tablet Take 2.5 tablets (250 mg total) by mouth daily. (Patient not taking: Reported on 08/16/2020)   No facility-administered encounter medications on file as of 08/16/2020.    ALLERGIES:  Allergies  Allergen Reactions  . Baclofen     lethargic   . Simvastatin Nausea Only     PHYSICAL EXAM:  ECOG Performance status: 1  Vitals:   08/16/20 1030  BP: 110/71  Pulse: 100  Resp: 20  Temp: 97.9 F (36.6 C)  SpO2: 95%   Filed Weights   08/16/20 1030  Weight: 165 lb 12.6 oz (75.2 kg)   Physical Exam Constitutional:      Appearance: Normal appearance. He is normal weight.  Cardiovascular:     Rate and Rhythm: Normal rate and regular rhythm.     Heart sounds: Normal heart sounds.  Pulmonary:     Effort: Pulmonary effort is normal.     Breath sounds: Normal breath sounds.  Abdominal:     General: Bowel sounds are normal.     Palpations: Abdomen is soft.  Musculoskeletal:        General: Normal range of motion.  Skin:    General: Skin is warm.  Neurological:     Mental Status: He is alert and oriented to person, place, and time. Mental status is at baseline.  Psychiatric:        Mood and Affect: Mood normal.        Behavior: Behavior normal.        Thought Content: Thought content normal.        Judgment: Judgment normal.      LABORATORY DATA:  I have reviewed the labs as listed.  CBC    Component Value Date/Time   WBC 5.6 08/11/2020 1022   RBC 5.96 (H) 08/11/2020 1022   HGB 15.5 08/11/2020 1022   HCT 50.3 08/11/2020 1022   PLT 101 (L) 08/11/2020 1022   MCV 84.4 08/11/2020 1022   MCH 26.0 08/11/2020 1022  MCHC 30.8 08/11/2020 1022   RDW 17.8 (H) 08/11/2020 1022   LYMPHSABS 1.6 08/11/2020 1022   MONOABS 0.4 08/11/2020 1022   EOSABS 0.6 (H) 08/11/2020 1022   BASOSABS 0.1 08/11/2020 1022   CMP Latest Ref Rng & Units 08/11/2020 07/22/2020 06/12/2020  Glucose 70 - 99 mg/dL 152(H) 128(H) 235(H)  BUN 8 - 23 mg/dL 24(H) 11 28(H)  Creatinine 0.61 - 1.24  mg/dL 0.87 0.83 0.66  Sodium 135 - 145 mmol/L 138 138 139  Potassium 3.5 - 5.1 mmol/L 4.6 4.1 4.5  Chloride 98 - 111 mmol/L 101 102 100  CO2 22 - 32 mmol/L 29 29 30   Calcium 8.9 - 10.3 mg/dL 9.1 9.1 9.0  Total Protein 6.5 - 8.1 g/dL 8.5(H) 8.2(H) -  Total Bilirubin 0.3 - 1.2 mg/dL 1.1 0.9 -  Alkaline Phos 38 - 126 U/L 103 97 -  AST 15 - 41 U/L 21 24 -  ALT 0 - 44 U/L 29 29 -    All questions were answered to patient's stated satisfaction. Encouraged patient to call with any new concerns or questions before his next visit to the cancer center and we can certain see him sooner, if needed.     ASSESSMENT & PLAN:  1.  Mild to moderate thrombocytopenia: -He has thrombocytopenia since 2010.  White count and hemoglobin are normal. -PET scan on 08/26/2017 for follow-up lung nodule showed fatty liver with normal spleen. -Liver biopsy on 02/21/2017 showed moderate to severe steatosis. -Etiology of thrombocytopenia includes liver disease versus ITP. -He takes an aspirin 81 mg daily.  He reports easy bruising on the upper extremities which is stable. -Labs from 08/11/2020 show a platelet count of 101. -RTC in 6 months with repeat lab work.  2.  Lung nodules: -Patient smoked 30 years, at least 1-1/2 packs/day.  He quit 7 to 8 years ago. -CT low-dose on 02/03/2020 was lung RADS 2.   -Patient had CT angio while hospitalized on 04/17/2020 which showed interval improvement of groundglass nodularity in the right middle lobe.  We will repeat imaging in 1 year. -He reports mild pink sputum when he coughs hard.  He does report occasional nosebleeds.  He wears oxygen 24/7.  Disposition: -RTC in 6 months with repeat lab work and assessment. -He will have repeat lung scan prior to so we may review with him.   No problem-specific Assessment & Plan notes found for this encounter.  Greater than 50% was spent in counseling and coordination of care with this patient including but not limited to discussion of  the relevant topics above (See A&P) including, but not limited to diagnosis and management of acute and chronic medical conditions.   Orders placed this encounter:  No orders of the defined types were placed in this encounter.  Faythe Casa, NP 08/16/2020 1:08 PM   [place] [phone]

## 2020-08-18 ENCOUNTER — Inpatient Hospital Stay (HOSPITAL_COMMUNITY)
Admission: EM | Admit: 2020-08-18 | Discharge: 2020-08-23 | DRG: 871 | Disposition: A | Discharge status: Home / Self Care | Payer: HMO | Attending: Internal Medicine | Admitting: Internal Medicine

## 2020-08-18 ENCOUNTER — Other Ambulatory Visit: Payer: Self-pay

## 2020-08-18 ENCOUNTER — Ambulatory Visit (HOSPITAL_COMMUNITY): Payer: PPO | Admitting: Nurse Practitioner

## 2020-08-18 ENCOUNTER — Emergency Department (HOSPITAL_COMMUNITY): Payer: HMO

## 2020-08-18 ENCOUNTER — Encounter (HOSPITAL_COMMUNITY): Payer: Self-pay | Admitting: Emergency Medicine

## 2020-08-18 DIAGNOSIS — E785 Hyperlipidemia, unspecified: Secondary | ICD-10-CM | POA: Diagnosis present

## 2020-08-18 DIAGNOSIS — Z7984 Long term (current) use of oral hypoglycemic drugs: Secondary | ICD-10-CM

## 2020-08-18 DIAGNOSIS — J9621 Acute and chronic respiratory failure with hypoxia: Secondary | ICD-10-CM | POA: Diagnosis present

## 2020-08-18 DIAGNOSIS — F419 Anxiety disorder, unspecified: Secondary | ICD-10-CM | POA: Diagnosis present

## 2020-08-18 DIAGNOSIS — N179 Acute kidney failure, unspecified: Secondary | ICD-10-CM | POA: Diagnosis present

## 2020-08-18 DIAGNOSIS — J9611 Chronic respiratory failure with hypoxia: Secondary | ICD-10-CM | POA: Diagnosis present

## 2020-08-18 DIAGNOSIS — J181 Lobar pneumonia, unspecified organism: Secondary | ICD-10-CM | POA: Diagnosis not present

## 2020-08-18 DIAGNOSIS — A310 Pulmonary mycobacterial infection: Secondary | ICD-10-CM | POA: Diagnosis present

## 2020-08-18 DIAGNOSIS — K7581 Nonalcoholic steatohepatitis (NASH): Secondary | ICD-10-CM | POA: Diagnosis present

## 2020-08-18 DIAGNOSIS — Z8249 Family history of ischemic heart disease and other diseases of the circulatory system: Secondary | ICD-10-CM | POA: Diagnosis not present

## 2020-08-18 DIAGNOSIS — J449 Chronic obstructive pulmonary disease, unspecified: Secondary | ICD-10-CM | POA: Diagnosis present

## 2020-08-18 DIAGNOSIS — R6521 Severe sepsis with septic shock: Secondary | ICD-10-CM | POA: Diagnosis present

## 2020-08-18 DIAGNOSIS — Z833 Family history of diabetes mellitus: Secondary | ICD-10-CM | POA: Diagnosis not present

## 2020-08-18 DIAGNOSIS — K219 Gastro-esophageal reflux disease without esophagitis: Secondary | ICD-10-CM | POA: Diagnosis present

## 2020-08-18 DIAGNOSIS — I10 Essential (primary) hypertension: Secondary | ICD-10-CM | POA: Diagnosis present

## 2020-08-18 DIAGNOSIS — Z7951 Long term (current) use of inhaled steroids: Secondary | ICD-10-CM

## 2020-08-18 DIAGNOSIS — J440 Chronic obstructive pulmonary disease with acute lower respiratory infection: Secondary | ICD-10-CM | POA: Diagnosis present

## 2020-08-18 DIAGNOSIS — R0602 Shortness of breath: Secondary | ICD-10-CM | POA: Diagnosis present

## 2020-08-18 DIAGNOSIS — Z87891 Personal history of nicotine dependence: Secondary | ICD-10-CM

## 2020-08-18 DIAGNOSIS — K7460 Unspecified cirrhosis of liver: Secondary | ICD-10-CM | POA: Diagnosis present

## 2020-08-18 DIAGNOSIS — Z7982 Long term (current) use of aspirin: Secondary | ICD-10-CM

## 2020-08-18 DIAGNOSIS — E876 Hypokalemia: Secondary | ICD-10-CM | POA: Diagnosis not present

## 2020-08-18 DIAGNOSIS — K7400 Hepatic fibrosis, unspecified: Secondary | ICD-10-CM | POA: Diagnosis present

## 2020-08-18 DIAGNOSIS — A419 Sepsis, unspecified organism: Secondary | ICD-10-CM | POA: Diagnosis present

## 2020-08-18 DIAGNOSIS — Z20822 Contact with and (suspected) exposure to covid-19: Secondary | ICD-10-CM | POA: Diagnosis present

## 2020-08-18 DIAGNOSIS — E119 Type 2 diabetes mellitus without complications: Secondary | ICD-10-CM

## 2020-08-18 DIAGNOSIS — J189 Pneumonia, unspecified organism: Secondary | ICD-10-CM

## 2020-08-18 DIAGNOSIS — Z888 Allergy status to other drugs, medicaments and biological substances status: Secondary | ICD-10-CM

## 2020-08-18 DIAGNOSIS — Z8701 Personal history of pneumonia (recurrent): Secondary | ICD-10-CM

## 2020-08-18 DIAGNOSIS — J918 Pleural effusion in other conditions classified elsewhere: Secondary | ICD-10-CM

## 2020-08-18 DIAGNOSIS — Z8041 Family history of malignant neoplasm of ovary: Secondary | ICD-10-CM | POA: Diagnosis not present

## 2020-08-18 DIAGNOSIS — J9600 Acute respiratory failure, unspecified whether with hypoxia or hypercapnia: Secondary | ICD-10-CM

## 2020-08-18 DIAGNOSIS — Z79899 Other long term (current) drug therapy: Secondary | ICD-10-CM

## 2020-08-18 DIAGNOSIS — F32A Depression, unspecified: Secondary | ICD-10-CM | POA: Diagnosis present

## 2020-08-18 DIAGNOSIS — J690 Pneumonitis due to inhalation of food and vomit: Secondary | ICD-10-CM | POA: Diagnosis present

## 2020-08-18 LAB — COMPREHENSIVE METABOLIC PANEL
ALT: 22 U/L (ref 0–44)
AST: 20 U/L (ref 15–41)
Albumin: 3.6 g/dL (ref 3.5–5.0)
Alkaline Phosphatase: 74 U/L (ref 38–126)
Anion gap: 11 (ref 5–15)
BUN: 22 mg/dL (ref 8–23)
CO2: 21 mmol/L — ABNORMAL LOW (ref 22–32)
Calcium: 8.5 mg/dL — ABNORMAL LOW (ref 8.9–10.3)
Chloride: 102 mmol/L (ref 98–111)
Creatinine, Ser: 1.52 mg/dL — ABNORMAL HIGH (ref 0.61–1.24)
GFR, Estimated: 51 mL/min — ABNORMAL LOW (ref >60)
Glucose, Bld: 178 mg/dL — ABNORMAL HIGH (ref 70–99)
Potassium: 3.6 mmol/L (ref 3.5–5.1)
Sodium: 134 mmol/L — ABNORMAL LOW (ref 135–145)
Total Bilirubin: 2.7 mg/dL — ABNORMAL HIGH (ref 0.3–1.2)
Total Protein: 7.5 g/dL (ref 6.5–8.1)

## 2020-08-18 LAB — BLOOD GAS, ARTERIAL
Acid-base deficit: 5.6 mmol/L — ABNORMAL HIGH (ref 0.0–2.0)
Bicarbonate: 19.4 mmol/L — ABNORMAL LOW (ref 20.0–28.0)
FIO2: 72
O2 Saturation: 91.2 %
Patient temperature: 37.1
pCO2 arterial: 42.4 mmHg (ref 32.0–48.0)
pH, Arterial: 7.292 — ABNORMAL LOW (ref 7.350–7.450)
pO2, Arterial: 70.1 mmHg — ABNORMAL LOW (ref 83.0–108.0)

## 2020-08-18 LAB — CBC WITH DIFFERENTIAL/PLATELET
Band Neutrophils: 4 %
Basophils Absolute: 0 10*3/uL (ref 0.0–0.1)
Basophils Relative: 0 %
Eosinophils Absolute: 0.1 10*3/uL (ref 0.0–0.5)
Eosinophils Relative: 2 %
HCT: 52.5 % — ABNORMAL HIGH (ref 39.0–52.0)
Hemoglobin: 16.4 g/dL (ref 13.0–17.0)
Lymphocytes Relative: 27 %
Lymphs Abs: 1.6 10*3/uL (ref 0.7–4.0)
MCH: 26.1 pg (ref 26.0–34.0)
MCHC: 31.2 g/dL (ref 30.0–36.0)
MCV: 83.5 fL (ref 80.0–100.0)
Metamyelocytes Relative: 11 %
Monocytes Absolute: 0.2 10*3/uL (ref 0.1–1.0)
Monocytes Relative: 3 %
Myelocytes: 3 %
Neutro Abs: 3 10*3/uL (ref 1.7–7.7)
Neutrophils Relative %: 47 %
Other: 2 %
Platelets: 108 10*3/uL — ABNORMAL LOW (ref 150–400)
Promyelocytes Relative: 1 %
RBC: 6.29 MIL/uL — ABNORMAL HIGH (ref 4.22–5.81)
RDW: 17.8 % — ABNORMAL HIGH (ref 11.5–15.5)
WBC: 5.8 10*3/uL (ref 4.0–10.5)
nRBC: 0.3 % — ABNORMAL HIGH (ref 0.0–0.2)

## 2020-08-18 LAB — RESP PANEL BY RT-PCR (FLU A&B, COVID) ARPGX2
Influenza A by PCR: NEGATIVE
Influenza B by PCR: NEGATIVE
SARS Coronavirus 2 by RT PCR: NEGATIVE

## 2020-08-18 LAB — LACTIC ACID, PLASMA
Lactic Acid, Venous: 4 mmol/L (ref 0.5–1.9)
Lactic Acid, Venous: 1.9 mmol/L (ref 0.5–1.9)

## 2020-08-18 LAB — APTT: aPTT: 34 seconds (ref 24–36)

## 2020-08-18 LAB — PROTIME-INR
INR: 1.2 (ref 0.8–1.2)
Prothrombin Time: 15 seconds (ref 11.4–15.2)

## 2020-08-18 LAB — MRSA PCR SCREENING: MRSA by PCR: NEGATIVE

## 2020-08-18 LAB — POC SARS CORONAVIRUS 2 AG -  ED: SARS Coronavirus 2 Ag: NEGATIVE

## 2020-08-18 LAB — GLUCOSE, CAPILLARY: Glucose-Capillary: 203 mg/dL — ABNORMAL HIGH (ref 70–99)

## 2020-08-18 MED ORDER — LACTATED RINGERS IV BOLUS (SEPSIS)
1000.0000 mL | Freq: Once | INTRAVENOUS | Status: AC
  Administered 2020-08-18: 1000 mL via INTRAVENOUS

## 2020-08-18 MED ORDER — SODIUM CHLORIDE 0.9 % IV SOLN: 2.0000 g | Freq: Three times a day (TID) | INTRAVENOUS | Status: DC

## 2020-08-18 MED ORDER — HEPARIN SODIUM (PORCINE) 5000 UNIT/ML IJ SOLN
5000.0000 [IU] | Freq: Three times a day (TID) | INTRAMUSCULAR | Status: DC
  Administered 2020-08-18 – 2020-08-20 (×6): 5000 [IU] via SUBCUTANEOUS
  Filled 2020-08-18 (×6): qty 1

## 2020-08-18 MED ORDER — ACETAMINOPHEN 325 MG PO TABS
650.0000 mg | ORAL_TABLET | Freq: Four times a day (QID) | ORAL | Status: DC | PRN
  Administered 2020-08-19 – 2020-08-23 (×4): 650 mg via ORAL
  Filled 2020-08-18 (×5): qty 2

## 2020-08-18 MED ORDER — INSULIN ASPART 100 UNIT/ML ~~LOC~~ SOLN
0.0000 [IU] | SUBCUTANEOUS | Status: DC
  Administered 2020-08-18: 3 [IU] via SUBCUTANEOUS
  Administered 2020-08-19: 1 [IU] via SUBCUTANEOUS
  Administered 2020-08-19 (×2): 2 [IU] via SUBCUTANEOUS
  Administered 2020-08-20 (×3): 1 [IU] via SUBCUTANEOUS
  Administered 2020-08-20: 2 [IU] via SUBCUTANEOUS
  Administered 2020-08-20: 1 [IU] via SUBCUTANEOUS
  Administered 2020-08-21 (×4): 2 [IU] via SUBCUTANEOUS
  Administered 2020-08-22: 3 [IU] via SUBCUTANEOUS
  Administered 2020-08-22 (×2): 1 [IU] via SUBCUTANEOUS
  Administered 2020-08-23: 2 [IU] via SUBCUTANEOUS
  Administered 2020-08-23 (×2): 1 [IU] via SUBCUTANEOUS
  Administered 2020-08-23: 3 [IU] via SUBCUTANEOUS

## 2020-08-18 MED ORDER — NOREPINEPHRINE 4 MG/250ML-% IV SOLN
2.0000 ug/min | INTRAVENOUS | Status: DC
  Administered 2020-08-18: 2 ug/min via INTRAVENOUS
  Administered 2020-08-19: 9 ug/min via INTRAVENOUS
  Administered 2020-08-19: 10 ug/min via INTRAVENOUS
  Filled 2020-08-18 (×3): qty 250

## 2020-08-18 MED ORDER — LACTATED RINGERS IV SOLN: INTRAVENOUS | Status: DC

## 2020-08-18 MED ORDER — METRONIDAZOLE IN NACL 5-0.79 MG/ML-% IV SOLN
500.0000 mg | Freq: Once | INTRAVENOUS | Status: AC
  Administered 2020-08-18: 500 mg via INTRAVENOUS
  Filled 2020-08-18: qty 100

## 2020-08-18 MED ORDER — IPRATROPIUM-ALBUTEROL 0.5-2.5 (3) MG/3ML IN SOLN: 3.0000 mL | RESPIRATORY_TRACT | Status: DC | PRN

## 2020-08-18 MED ORDER — ONDANSETRON HCL 4 MG/2ML IJ SOLN: 4.0000 mg | Freq: Four times a day (QID) | INTRAMUSCULAR | Status: DC | PRN

## 2020-08-18 MED ORDER — VANCOMYCIN HCL 1750 MG/350ML IV SOLN
1750.0000 mg | Freq: Once | INTRAVENOUS | Status: AC
  Administered 2020-08-18: 1750 mg via INTRAVENOUS
  Filled 2020-08-18: qty 350

## 2020-08-18 MED ORDER — NOREPINEPHRINE 4 MG/250ML-% IV SOLN: 0.0000 ug/min | INTRAVENOUS | Status: DC

## 2020-08-18 MED ORDER — IPRATROPIUM-ALBUTEROL 0.5-2.5 (3) MG/3ML IN SOLN
3.0000 mL | Freq: Four times a day (QID) | RESPIRATORY_TRACT | Status: DC
  Administered 2020-08-19 – 2020-08-20 (×6): 3 mL via RESPIRATORY_TRACT
  Filled 2020-08-18 (×6): qty 3

## 2020-08-18 MED ORDER — VANCOMYCIN HCL 1250 MG/250ML IV SOLN: 1250.0000 mg | INTRAVENOUS | Status: DC

## 2020-08-18 MED ORDER — SODIUM CHLORIDE 0.9 % IV SOLN: 250.0000 mL | INTRAVENOUS | Status: DC

## 2020-08-18 MED ORDER — SODIUM CHLORIDE 0.9 % IV SOLN
2.0000 g | Freq: Three times a day (TID) | INTRAVENOUS | Status: DC
  Administered 2020-08-18 – 2020-08-23 (×14): 2 g via INTRAVENOUS
  Filled 2020-08-18 (×13): qty 2

## 2020-08-18 MED ORDER — ACETAMINOPHEN 650 MG RE SUPP: 650.0000 mg | Freq: Four times a day (QID) | RECTAL | Status: DC | PRN

## 2020-08-18 MED ORDER — VANCOMYCIN HCL IN DEXTROSE 1-5 GM/200ML-% IV SOLN: 1000.0000 mg | Freq: Once | INTRAVENOUS | Status: DC

## 2020-08-18 MED ORDER — LACTATED RINGERS IV BOLUS (SEPSIS)
500.0000 mL | Freq: Once | INTRAVENOUS | Status: AC
  Administered 2020-08-18: 500 mL via INTRAVENOUS

## 2020-08-18 MED ORDER — ONDANSETRON HCL 4 MG PO TABS: 4.0000 mg | ORAL_TABLET | Freq: Four times a day (QID) | ORAL | Status: DC | PRN

## 2020-08-18 MED ORDER — SODIUM CHLORIDE 0.9 % IV SOLN
2.0000 g | Freq: Once | INTRAVENOUS | Status: AC
  Administered 2020-08-18: 2 g via INTRAVENOUS
  Filled 2020-08-18: qty 2

## 2020-08-18 NOTE — H&P (Addendum)
History and Physical    [name] MOQ:[id number] DOB: 1955/07/24 DOA: 08/18/2020  PCP: Asencion Noble, MD   Patient coming from: Home  I have personally briefly reviewed patient's old medical records in [place]  Chief Complaint: fever, vomiting  HPI: [name] is a 65 y.o. male with medical history significant for COPD with chronic respiratory failure on 3 L, ??  Mycobacterium avium infection, diabetes mellitus, hypertension, NASH. Patient was brought to the ED with complaints of fever and vomiting that started last night.  Patient's wife is present at bedside and helps with the history.  She reports chronic difficulty breathing that was not significantly changed, but like yesterday patient had a fever of 101.4, so she gave him Tylenol and then he threw up.  He has chronic cough, she does not think has changed.  On EMS arrival, patient's O2 sats were 84% on 3 L, was placed on high flow nasal cannula.  ED Course: T-max 101, tachycardic to 140s, tachypneic to 30s, hypotension systolic down to 35/46.  O2 sats 91 to 93% on 13 L/min of high flow nasal cannula.  Lactic acidosis of 4 >> 1.9, after sepsis fluid bolus given.  Chest x-ray shows large left lower lobe pneumonia with small left pleural effusion.  WBC 5.8.  EKG sinus tachycardia rate 149.  UA pending.  Blood cultures obtained.  COVID test negative.  With persistent hypotension despite sepsis fluid bolus, patient was started on Levophed via peripheral line with improvement in blood pressure, systolic now 56C. Broad-spectrum antibiotics IV Vanco cefepime and metronidazole started. EDP talked to pulmonologist on-call Dr. Melvyn Novas, will see in a.m. hospitalist to admit.  Review of Systems: As per HPI all other systems reviewed and negative.  Past Medical History:  Diagnosis Date  . Anxiety   . Asthma   . Chest pain    a. 2003 Cath: nl cors.  [name] COPD (chronic obstructive pulmonary disease) ([place])   . Depression   . Diabetes  mellitus   . Dyslipidemia   . Dysrhythmia   . Fracture 12/15   lower back "L-1"  . GERD (gastroesophageal reflux disease)   . Hypertension   . NASH (nonalcoholic steatohepatitis) 2018   [name], undergoing routine cirrhosis care  . PONV (postoperative nausea and vomiting)   . Splenomegaly     Past Surgical History:  Procedure Laterality Date  . BIOPSY  07/26/2016   Procedure: BIOPSY;  Surgeon: [name], MD;  [facility];  Service: Endoscopy;;  gastric  . CARDIAC CATHETERIZATION  10/22/2001   normal coronary arteriea (Dr. Domenic Moras)  . COLONOSCOPY  08/31/2011   LEX:[id number] rectal and colon polyps-treated. Single tubular adenoma. Next TCS 08/2016  . COLONOSCOPY N/A 07/26/2016   Dr. [name]: diverticulosis in sigmoid and descending colon. 6 mm benign splenic flexure. surveillance 5 yeras  . ESOPHAGOGASTRODUODENOSCOPY N/A 07/26/2016   Dr. [name]: normal esophagus, portal gastropathy, chronic gastritis, normal duodenum, screening EGD 2 years   . ESOPHAGOGASTRODUODENOSCOPY (EGD) WITH ESOPHAGEAL DILATION N/A 05/15/2013   BSW:HQPRFF esophagus-status post Venia Minks dilation/Portal gastropathy. Antral erosions-status post biopsy. extrinsic compression along lesser curvature likely secondary to splenomegaly. gastric bx benign  . HERNIA REPAIR  03/2009  . POLYPECTOMY  07/26/2016   Procedure: POLYPECTOMY;  Surgeon: [name], MD;  Location: [facility];  Service: Endoscopy;;  colon  . TRANSTHORACIC ECHOCARDIOGRAM  2011   EF 63-84%, stage 1 diastolic dysfunction, trace TR & pulm valve regurg   . VASECTOMY  1989     reports that he quit smoking about 9 years ago. His smoking use included cigarettes. He has a 45.00 pack-year smoking history. He has quit using smokeless tobacco. He reports that he does not drink alcohol and does not use drugs.  Allergies  Allergen Reactions  . Baclofen     lethargic   . Simvastatin Nausea Only    Family History  Problem Relation Age of  Onset  . Heart disease Mother   . Cancer Mother        lymph nodes  . Diabetes Mother   . Arthritis Other   . Lung disease Other   . Asthma Other   . Kidney disease Other   . Ovarian cancer Sister   . Diabetes Brother   . Heart disease Brother   . Hypertension Brother   . Diabetes Sister   . Diabetes Sister   . Cirrhosis Sister 55       no etoh  . Colon cancer Neg Hx     Prior to Admission medications   Medication Sig Start Date End Date Taking? Authorizing Provider  acetaminophen (TYLENOL) 500 MG tablet Take 1,000 mg by mouth daily as needed for headache.    Yes [provider]  albuterol (PROVENTIL) (2.5 MG/3ML) 0.083% nebulizer solution Take 3 mLs (2.5 mg total) by nebulization every 2 (two) hours as needed for wheezing or shortness of breath (cough). 06/13/20  Yes Nathaniel Wakeley, [place], MD  ALPRAZolam [name]) 0.25 MG tablet Take 0.25 mg by mouth 2 (two) times daily as needed for sleep.    Yes [provider]  aspirin EC 81 MG tablet Take 1 tablet (81 mg total) by mouth daily with breakfast. 06/13/20  Yes Jaylanni Eltringham, Courage, MD  atorvastatin (LIPITOR) 40 MG tablet Take 1 tablet (40 mg total) by mouth daily. 06/13/20  Yes Gardenia Witter, Courage, MD  budesonide-formoterol (SYMBICORT) 160-4.5 MCG/ACT inhaler Inhale 2 puffs into the lungs 2 (two) times daily. 06/13/20  Yes Crockett Rallo, Courage, MD  Cholecalciferol (VITAMIN D3) 25 MCG (1000 UT) CAPS Take 1 capsule by mouth daily.   Yes [provider]  [facility] VITAMIN C 250 MG tablet Take 250 mg by mouth daily. 06/13/20  Yes [provider]  diltiazem (CARDIZEM CD) 240 MG 24 hr capsule Take 1 capsule (240 mg total) by mouth daily. 06/13/20  Yes Brysten Reister, Courage, MD  furosemide (LASIX) 20 MG tablet Take 20 mg by mouth.   Yes [provider]  gabapentin (NEURONTIN) 300 MG capsule Take 1 capsule (300 mg total) by mouth at bedtime. 06/13/20  Yes Oris Calmes, Courage, MD  glimepiride (AMARYL) 2 MG tablet Take 1 tablet (2 mg  total) by mouth daily with breakfast. Please do NOT take if you skip breakfast 06/13/20  Yes Yassmin Binegar, Courage, MD  JARDIANCE 25 MG TABS tablet Take 25 mg by mouth every evening.  01/01/17  Yes [provider]  Multiple Vitamin (MULTIVITAMIN WITH MINERALS) TABS tablet Take 1 tablet by mouth at bedtime. 06/13/20  Yes Damyah Gugel, Courage, MD  OXYGEN Inhale 3 L into the lungs.   Yes [provider]  pantoprazole (PROTONIX) 40 MG tablet Take 1 tablet (40 mg total) by mouth daily. 07/14/20  Yes Aliene Altes S, PA-C  potassium chloride (MICRO-K) 10 MEQ CR capsule Take 10 mEq by mouth daily.   Yes [provider]  PROAIR HFA 108 (90 Base) MCG/ACT inhaler Inhale 2 puffs into the lungs every 4 (four) hours as needed for wheezing or shortness of breath.  06/13/20  Yes Jaymarion Trombly, Courage, MD  sertraline (ZOLOFT) 50 MG tablet Take 1 tablet (50 mg total) by mouth daily. 06/13/20  Yes Tupac Jeffus, Courage, MD  SitaGLIPtin-MetFORMIN HCl [phone] MG TB24 Take 1 tablet by mouth daily.   Yes [provider]  tamsulosin (FLOMAX) 0.4 MG CAPS capsule Take 0.4 mg by mouth daily.   Yes [provider]  thiamine 100 MG tablet Take 1 tablet (100 mg total) by mouth daily. 06/14/20  Yes Octavion Mollenkopf, Courage, MD  Tiotropium Bromide Monohydrate (SPIRIVA RESPIMAT) 2.5 MCG/ACT AERS Inhale 2 puffs into the lungs daily. 06/13/20  Yes Lincon Sahlin, Courage, MD  guaiFENesin (MUCINEX) 600 MG 12 hr tablet Take 1 tablet (600 mg total) by mouth 2 (two) times daily. Patient not taking: Reported on 08/18/2020 06/13/20   Roxan Hockey, MD  [facility] LANCETS 25E MISC  08/26/18   [provider]  [facility] VERIO test strip  08/26/18   [provider]  VITAMIN D PO Take 1 tablet by mouth daily. Patient not taking: Reported on 08/18/2020    [provider]    Physical Exam: Vitals:   08/18/20 2045 08/18/20 2100 08/18/20 2115 08/18/20 2131  BP: (!) 93/58 92/63 90/62    Pulse: (!) 122 (!) 122  (!) 121   Resp: (!) 23 (!) 23 (!) 32   Temp:    98.8 F (37.1 C)  TempSrc:    Axillary  SpO2: 93% 93% 92%   Weight:      Height:        Constitutional: Acutely ill-appearing Vitals:   08/18/20 2045 08/18/20 2100 08/18/20 2115 08/18/20 2131  BP: (!) 93/58 92/63 90/62    Pulse: (!) 122 (!) 122 (!) 121   Resp: (!) 23 (!) 23 (!) 32   Temp:    98.8 F (37.1 C)  TempSrc:    Axillary  SpO2: 93% 93% 92%   Weight:      Height:       Eyes: PERRL, lids and conjunctivae normal ENMT: Mucous membranes are dry  Neck: normal, supple, no masses, no thyromegaly Respiratory: Coarse breath sound bibasilarly, but no obvious wheeze,  Fair air entry bilaterally, mild to moderate increased work of breathing, use of abdominal accessory muscles to breathe, per spouse this is normal.    Cardiovascular: Tachycardic, regular rate and rhythm, no murmurs / rubs / gallops. No extremity edema. 2+ pedal pulses.  Abdomen: no tenderness, no masses palpated. No hepatosplenomegaly. Bowel sounds positive.  Musculoskeletal: no clubbing / cyanosis. No joint deformity upper and lower extremities. Good ROM, no contractures. Normal muscle tone.  Skin: no rashes, lesions, ulcers. No induration Neurologic: No apparent cranial abnormalities, moving extremities spontaneously. Psychiatric: Normal judgment and insight. Alert and oriented x 3. Normal mood.   Labs on Admission: I have personally reviewed following labs and imaging studies  CBC: Recent Labs  Lab 08/18/20 1548  WBC 5.8  NEUTROABS 3.0  HGB 16.4  HCT 52.5*  MCV 83.5  PLT 527*   Basic Metabolic Panel: Recent Labs  Lab 08/18/20 1548  NA 134*  K 3.6  CL 102  CO2 21*  GLUCOSE 178*  BUN 22  CREATININE 1.52*  CALCIUM 8.5*   Liver Function Tests: Recent Labs  Lab 08/18/20 1548  AST 20  ALT 22  ALKPHOS 74  BILITOT 2.7*  PROT 7.5  ALBUMIN 3.6   Coagulation Profile: Recent Labs  Lab 08/18/20 1548  INR 1.2   Radiological Exams on  Admission: [facility] Chest [facility] 1 [address]  Result Date: 08/18/2020 CLINICAL DATA:  Shortness of breath, weakness. EXAM: PORTABLE CHEST 1 VIEW COMPARISON:  July 22, 2020. FINDINGS: The heart size and mediastinal contours are within normal limits. No pneumothorax is noted. Large left lower lobe airspace opacity is noted consistent with pneumonia. Small left pleural effusion is noted. Stable calcified granuloma is noted in right upper lobe. The visualized skeletal structures are unremarkable. IMPRESSION: Large left lower lobe pneumonia. Small left pleural effusion. Electronically Signed   By: Marijo Conception M.D.   On: 08/18/2020 16:06    EKG: Independently reviewed.  Sinus tachycardia, rate 149, QTc 421.  No significant change from prior.  Assessment/Plan Principal Problem:   Septic shock ([name]) Active Problems:   Type 2 diabetes mellitus without complication, without long-term current use of insulin ([name])   HTN (hypertension)   Liver fibrosis   Mycobacterium avium infection ([place])   COPD GOLD III   Chronic respiratory failure with hypoxia ([name])   Acute on chronic respiratory failure with hypoxia ([name])   Lobar pneumonia ([name])   Septic shock secondary to pneumonia- chest x-ray showing large left LL pneumonia, with florid sepsis,-febrile 201, tachycardic to 148, 230s, hypotensive systolic down to 34/74, with endorgan dysfunction with acute respiratory failure, acute kidney injury and significant lactic acidosis of 4 >> 1.9 after sepsis fluid bolus.  Persistent hypotension requiring norepinephrine drip, via peripheral line, systolic improved currently 93/53 with map in the 70s. -Continue Levophed via peripheral, consider PICC or central line in a.m. -Continue broad-spectrum antibiotics IV vancomycin and cefepime add azithromycin -Recent outpatient swallow evaluation 1/28- deemed mild aspiration risk, recommended continuing regular solids and thin liquids.  -N.p.o. for now -Obtain ABG-pH is 7.29, PCO2 of  42, PO2 of 70.  Serum bicarb of 21.  Acidosis likely related to lactic acidosis. -CBC, CMP a.m. -Please consult pulmonology in the morning - Insert foley -Follow-up blood and urine cultures -Follow-up sputum cultures  Acute on chronic hypoxic respiratory failure-O2 sats 84% on home 3 L, currently on 20 L high flow nasal cannula.  Acute failure likely due to pneumonia and sepsis. -Supplemental O2  AKI - Cr 1.52, baseline 0.6-0.8.  2/2 septic shock. - IVF RL 150cc/hr x 20hrs  COPD -appears stable at this time.  No wheezing appreciated.  Fair air entry bilaterally -DuoNebs as needed and scheduled x 2 -Mucolytic's as needed -Resume Symbicort  Diabetes mellitus-random glucose 178 - NPO, hold glimepiride and Jardiance, Metformin and sitagliptin - SSI- s Q4 H - HTN-stable. - Hold diltiazem, tamsulosin  NASH Liver cirrhosis  Depression - hold Xanax 0.25 mg twice daily, Zoloft  CRITICAL CARE Performed by: Bethena Roys   Total critical care time: 70 minutes  Critical care time was exclusive of separately billable procedures and treating other patients.  Critical care was necessary to treat or prevent imminent or life-threatening deterioration.  Critical care was time spent personally by me on the following activities: development of treatment plan with patient and/or surrogate as well as nursing, discussions with consultants, evaluation of patient's response to treatment, examination of patient, obtaining history from patient or surrogate, ordering and performing treatments and interventions, ordering and review of laboratory studies, ordering and review of radiographic studies, pulse oximetry and re-evaluation of patient's condition.    DVT prophylaxis: Heparin Code Status: Full code confirmed with patient's spouse at bedside. Family Communication: Patient spouse [name] at bedside.  Patient is critically ill, I have explained this and plan of care to patient  spouse. Disposition Plan:  >  2 days, pending resolution of sepsis shock. Consults called: Please consult pulmonology in the morning Admission status: Inpt, ICU  I certify that at the point of admission it is my clinical judgment that the patient will require inpatient hospital care spanning beyond 2 midnights from the point of admission due to high intensity of service, high risk for further deterioration and high frequency of surveillance required. The following factors support the patient status of inpatient: Septic shock.   Bethena Roys MD [facility]  08/18/2020, 10:52 PM

## 2020-08-18 NOTE — ED Provider Notes (Addendum)
[facility] EMERGENCY DEPARTMENT Provider Note  CSN: [id number] Arrival date & time: 08/18/20 1515    History Chief Complaint  Patient presents with  . Shortness of Breath    HPI  [name] is a 65 y.o. male with history of COPD and recurrent pneumonia reports he began feeling more SOB last night with associated productive cough and fever. He had a couple of episodes of vomiting today. He had some mid chest pain which has since gone away.He was noted by EMS to be hypoxic on his usual home oxygen, given Albuterol x 2 and solmedrol without much improvement.    Past Medical History:  Diagnosis Date  . Anxiety   . Asthma   . Chest pain    a. 2003 Cath: nl cors.  [name] COPD (chronic obstructive pulmonary disease) ([place])   . Depression   . Diabetes mellitus   . Dyslipidemia   . Dysrhythmia   . Fracture 12/15   lower back "L-1"  . GERD (gastroesophageal reflux disease)   . Hypertension   . NASH (nonalcoholic steatohepatitis) 2018   [name], undergoing routine cirrhosis care  . PONV (postoperative nausea and vomiting)   . Splenomegaly     Past Surgical History:  Procedure Laterality Date  . BIOPSY  07/26/2016   Procedure: BIOPSY;  Surgeon: [name], MD;  Location: [facility];  Service: Endoscopy;;  gastric  . CARDIAC CATHETERIZATION  10/22/2001   normal coronary arteriea (Dr. Domenic Moras)  . COLONOSCOPY  08/31/2011   TMA:[id number] rectal and colon polyps-treated. Single tubular adenoma. Next TCS 08/2016  . COLONOSCOPY N/A 07/26/2016   Dr. [name]: diverticulosis in sigmoid and descending colon. 6 mm benign splenic flexure. surveillance 5 yeras  . ESOPHAGOGASTRODUODENOSCOPY N/A 07/26/2016   Dr. [name]: normal esophagus, portal gastropathy, chronic gastritis, normal duodenum, screening EGD 2 years   . ESOPHAGOGASTRODUODENOSCOPY (EGD) WITH ESOPHAGEAL DILATION N/A 05/15/2013   GYB:WLSLHT esophagus-status post Venia Minks dilation/Portal gastropathy. Antral erosions-status post  biopsy. extrinsic compression along lesser curvature likely secondary to splenomegaly. gastric bx benign  . HERNIA REPAIR  03/2009  . POLYPECTOMY  07/26/2016   Procedure: POLYPECTOMY;  Surgeon: [name], MD;  Location: [facility];  Service: Endoscopy;;  colon  . TRANSTHORACIC ECHOCARDIOGRAM  2011   EF 34-28%, stage 1 diastolic dysfunction, trace TR & pulm valve regurg   . VASECTOMY  1989    Family History  Problem Relation Age of Onset  . Heart disease Mother   . Cancer Mother        lymph nodes  . Diabetes Mother   . Arthritis Other   . Lung disease Other   . Asthma Other   . Kidney disease Other   . Ovarian cancer Sister   . Diabetes Brother   . Heart disease Brother   . Hypertension Brother   . Diabetes Sister   . Diabetes Sister   . Cirrhosis Sister 79       no etoh  . Colon cancer Neg Hx     Social History   Tobacco Use  . Smoking status: Former Smoker    Packs/day: 1.25    Years: 36.00    Pack years: 45.00    Types: Cigarettes    Quit date: 04/2011    Years since quitting: 9.3  . Smokeless tobacco: Former Network engineer  . Vaping Use: Never used  Substance Use Topics  . Alcohol use: No  . Drug use: No  Home Medications Prior to Admission medications   Medication Sig Start Date End Date Taking? Authorizing Provider  acetaminophen (TYLENOL) 500 MG tablet Take 1,000 mg by mouth daily as needed for headache.     [provider]  albuterol (PROVENTIL) (2.5 MG/3ML) 0.083% nebulizer solution Take 3 mLs (2.5 mg total) by nebulization every 2 (two) hours as needed for wheezing or shortness of breath (cough). 06/13/20   Roxan Hockey, MD  ALPRAZolam [name]) 0.25 MG tablet Take 0.25 mg by mouth 2 (two) times daily as needed for sleep.     [provider]  aspirin EC 81 MG tablet Take 1 tablet (81 mg total) by mouth daily with breakfast. 06/13/20   Denton Brick, Courage, MD  atorvastatin (LIPITOR) 40 MG tablet Take 1 tablet (40 mg total) by  mouth daily. 06/13/20   Roxan Hockey, MD  budesonide-formoterol (SYMBICORT) 160-4.5 MCG/ACT inhaler Inhale 2 puffs into the lungs 2 (two) times daily. 06/13/20   Roxan Hockey, MD  [facility] VITAMIN C 250 MG tablet Take 250 mg by mouth daily. 06/13/20   [provider]  diltiazem (CARDIZEM CD) 240 MG 24 hr capsule Take 1 capsule (240 mg total) by mouth daily. 06/13/20   Roxan Hockey, MD  furosemide (LASIX) 20 MG tablet Take 20 mg by mouth.    [provider]  gabapentin (NEURONTIN) 300 MG capsule Take 1 capsule (300 mg total) by mouth at bedtime. 06/13/20   Roxan Hockey, MD  glimepiride (AMARYL) 2 MG tablet Take 1 tablet (2 mg total) by mouth daily with breakfast. Please do NOT take if you skip breakfast 06/13/20   Roxan Hockey, MD  guaiFENesin (MUCINEX) 600 MG 12 hr tablet Take 1 tablet (600 mg total) by mouth 2 (two) times daily. Patient taking differently: Take 600 mg by mouth 2 (two) times daily as needed. 06/13/20   Roxan Hockey, MD  JARDIANCE 25 MG TABS tablet Take 25 mg by mouth every evening.  01/01/17   [provider]  Multiple Vitamin (MULTIVITAMIN WITH MINERALS) TABS tablet Take 1 tablet by mouth at bedtime. 06/13/20   Roxan Hockey, MD  [facility] DELICA LANCETS 94T MISC  08/26/18   [provider]  [facility] VERIO test strip  08/26/18   [provider]  OXYGEN Inhale 4 L into the lungs. Pt uses oxygen at night and when exerting himself    [provider]  pantoprazole (PROTONIX) 40 MG tablet Take 1 tablet (40 mg total) by mouth daily. 07/14/20   Erenest Rasher, PA-C  potassium chloride (MICRO-K) 10 MEQ CR capsule Take 10 mEq by mouth daily.    [provider]  PROAIR HFA [phone] Base) MCG/ACT inhaler Inhale 2 puffs into the lungs every 4 (four) hours as needed for wheezing or shortness of breath. 06/13/20   Roxan Hockey, MD  sertraline (ZOLOFT) 50 MG tablet Take 1 tablet (50 mg total) by mouth daily. 06/13/20    Roxan Hockey, MD  SitaGLIPtin-MetFORMIN HCl [phone] MG TB24 Take 1 tablet by mouth daily.    [provider]  tamsulosin (FLOMAX) 0.4 MG CAPS capsule Take 0.4 mg by mouth daily.    [provider]  thiamine 100 MG tablet Take 1 tablet (100 mg total) by mouth daily. 06/14/20   Roxan Hockey, MD  Tiotropium Bromide Monohydrate (SPIRIVA RESPIMAT) 2.5 MCG/ACT AERS Inhale 2 puffs into the lungs daily. 06/13/20   Roxan Hockey, MD  VITAMIN D PO Take 1 tablet by mouth daily.    [provider]     Allergies    Baclofen and Simvastatin   Review of Systems   Review of Systems Unable to assess due to acuity of condition  Physical Exam BP (!) 74/50   Pulse (!) 128   Temp (!) 100.4 F (38 C) (Axillary)   Resp (!) 21   Ht 5' 7"  (1.702 m)   Wt 75.2 kg   SpO2 92%   BMI 25.97 kg/m   Physical Exam Vitals and nursing note reviewed.  Constitutional:      Appearance: Normal appearance.  HENT:     Head: Normocephalic and atraumatic.     Nose: Nose normal.     Mouth/Throat:     Mouth: Mucous membranes are dry.  Eyes:     Extraocular Movements: Extraocular movements intact.     Conjunctiva/sclera: Conjunctivae normal.  Cardiovascular:     Rate and Rhythm: Tachycardia present.  Pulmonary:     Effort: Pulmonary effort is normal. Tachypnea present.     Breath sounds: Rhonchi and rales present.  Abdominal:     General: Abdomen is flat.     Palpations: Abdomen is soft.     Tenderness: There is no abdominal tenderness.  Musculoskeletal:        General: No swelling. Normal range of motion.     Cervical back: Neck supple.  Skin:    General: Skin is warm and dry.  Neurological:     General: No focal deficit present.     Mental Status: He is alert.  Psychiatric:        Mood and Affect: Mood normal.      ED Results / Procedures / Treatments   Labs (all labs ordered are listed, but only abnormal results are displayed) Labs Reviewed  LACTIC ACID,  PLASMA - Abnormal; Notable for the following components:      Result Value   Lactic Acid, Venous 4.0 (*)    All other components within normal limits  COMPREHENSIVE METABOLIC PANEL - Abnormal; Notable for the following components:   Sodium 134 (*)    CO2 21 (*)    Glucose, Bld 178 (*)    Creatinine, Ser 1.52 (*)    Calcium 8.5 (*)    Total Bilirubin 2.7 (*)    GFR, Estimated 51 (*)    All other components within normal limits  CBC WITH DIFFERENTIAL/PLATELET - Abnormal; Notable for the following components:   RBC 6.29 (*)    HCT 52.5 (*)    RDW 17.8 (*)    Platelets 108 (*)    nRBC 0.3 (*)    All other components within normal limits  RESP PANEL BY RT-PCR (FLU A&B, COVID) [id number]  CULTURE, BLOOD (ROUTINE X 2)  CULTURE, BLOOD (ROUTINE X 2)  MRSA PCR SCREENING  URINE CULTURE  LACTIC ACID, PLASMA  PROTIME-INR  APTT  URINALYSIS, ROUTINE W REFLEX MICROSCOPIC  PATHOLOGIST SMEAR REVIEW  POC SARS CORONAVIRUS 2 AG -  ED    EKG EKG Interpretation  Date/Time:  Thursday August 18 2020 15:28:24 EST Ventricular Rate:  149 PR Interval:    QRS Duration: 89 QT Interval:  267 QTC Calculation: 421 R Axis:   84 Text Interpretation: Sinus tachycardia Borderline right axis deviation Repolarization abnormality, prob rate related Minimal ST elevation, lateral leads No significant change since last tracing Confirmed by Calvert Cantor [phone]) on 08/18/2020 3:36:30 PM   Radiology DG Chest Port 1 View  Result Date: 08/18/2020 CLINICAL DATA:  Shortness of breath, weakness. EXAM: PORTABLE CHEST  1 VIEW COMPARISON:  July 22, 2020. FINDINGS: The heart size and mediastinal contours are within normal limits. No pneumothorax is noted. Large left lower lobe airspace opacity is noted consistent with pneumonia. Small left pleural effusion is noted. Stable calcified granuloma is noted in right upper lobe. The visualized skeletal structures are unremarkable. IMPRESSION: Large left lower lobe pneumonia.  Small left pleural effusion. Electronically Signed   By: Marijo Conception M.D.   On: 08/18/2020 16:06    Procedures .Critical Care Performed by: Truddie Hidden, MD Authorized by: Truddie Hidden, MD   Critical care provider statement:    Critical care time (minutes):  45   Critical care time was exclusive of:  Separately billable procedures and treating other patients   Critical care was necessary to treat or prevent imminent or life-threatening deterioration of the following conditions:  Respiratory failure and sepsis   Critical care was time spent personally by me on the following activities:  Discussions with consultants, evaluation of patient's response to treatment, examination of patient, ordering and performing treatments and interventions, ordering and review of laboratory studies, ordering and review of radiographic studies, pulse oximetry, re-evaluation of patient's condition, obtaining history from patient or surrogate and review of old charts    Medications Ordered in the ED Medications  lactated ringers infusion ( Intravenous New Bag/Given 08/18/20 1834)  vancomycin (VANCOREADY) IVPB 1750 mg/350 mL (1,750 mg Intravenous New Bag/Given 08/18/20 1701)  vancomycin (VANCOREADY) IVPB 1250 mg/250 mL (has no administration in time range)  ceFEPIme (MAXIPIME) 2 g in sodium chloride 0.9 % 100 mL IVPB (has no administration in time range)  0.9 %  sodium chloride infusion (has no administration in time range)  0.9 %  sodium chloride infusion (has no administration in time range)  norepinephrine (LEVOPHED) 29m in 2528mpremix infusion (has no administration in time range)  lactated ringers bolus 1,000 mL (0 mLs Intravenous Stopped 08/18/20 1721)    And  lactated ringers bolus 1,000 mL (0 mLs Intravenous Stopped 08/18/20 1830)    And  lactated ringers bolus 500 mL (0 mLs Intravenous Stopped 08/18/20 1830)  ceFEPIme (MAXIPIME) 2 g in sodium chloride 0.9 % 100 mL IVPB (0 g Intravenous  Stopped 08/18/20 1642)  metroNIDAZOLE (FLAGYL) IVPB 500 mg (0 mg Intravenous Stopped 08/18/20 1721)     MDM Rules/Calculators/A&P MDM Patient found to be hypoxic, febrile, tachypneic, tachycardic and hypotensive on arrival. He has had 2 Covid vaccines and his POC was neg on arrival. Sepsis order set was initiated including LR bolus, broad spectrum abx and cultures. He is current on 15L by nasal canula with SpO2 90-92%. He reports he has been intubated before.   ED Course  I have reviewed the triage vital signs and the nursing notes.  Pertinent labs & imaging results that were available during my care of the patient were reviewed by me and considered in my medical decision making (see chart for details).  Clinical Course as of 08/18/20 1849  Thu Aug 18, 2020  1614 CXR images reviewed, consistent with suspected PNA.  [CS]  1648 Creatinine mildly increased [CS]  1650 CMP with increase in creatinine from baseline. Lactic acid is elevated, consistent with sepsis. LR bolus is infusing. Will recheck after completion. Coags are neg.  [CS]  [id number] with normal WBC, mild thrombocytopenia, similar to previous.  [CS]  [id number] is normal.  [CS]  179509actic acid improved but BP remains low, will begin levophed [CS]  183267ovid is negative.  [  CS]  1844 Spoke with Dr. Arlyce Dice, Hospitalist who will evaluate for admission.  [CS]    Clinical Course User Index [CS] Truddie Hidden, MD    Final Clinical Impression(s) / ED Diagnoses Final diagnoses:  Community acquired pneumonia of left lower lobe of lung  Sepsis, due to unspecified organism, unspecified whether acute organ dysfunction present [facility])  Acute and chronic respiratory failure with hypoxia [facility])    Rx / DC Orders ED Discharge Orders    None       Truddie Hidden, MD 08/18/20 1849    Truddie Hidden, MD 08/18/20 [phone]

## 2020-08-18 NOTE — ED Notes (Signed)
Bladder scanned pt. Pt. Had 500 mls in bladder.

## 2020-08-18 NOTE — ED Triage Notes (Signed)
Pt from home via [facility]. Per pt he began to get [facility] and vomiting last night. Pt on 3L [place] chronically. Pt 84% on 3L at this time. Pt placed on 13L HFNC and O2 92%. Pt received 2 albuterol breathing tx and 162m solumedrol en route.

## 2020-08-18 NOTE — Progress Notes (Signed)
Following for code sepsis 

## 2020-08-18 NOTE — Progress Notes (Addendum)
Pharmacy Antibiotic Note  [name] is a 65 y.o. male admitted on 08/18/2020 with sepsis.  Pharmacy has been consulted for vancomycin and cefepime  dosing.  Plan: Start cefepime 2g IV q8h Give  vancomycin 1.75g  IV  x1 dose, then vancomycin 1.25g IV  q24h Goal vancomycin trough range: 15-30mg/mL Pharmacy will continue to monitor renal function, vancomycin troughs as clinically appropriate,  cultures and patient progress.   Height: 5' 7"  (170.2 cm) Weight: 75.2 kg (165 lb 12.6 oz) IBW/kg (Calculated) : 66.1  No data recorded.  No results for input(s): WBC, CREATININE, LATICACIDVEN, VANCOTROUGH, VANCOPEAK, VANCORANDOM, GENTTROUGH, GENTPEAK, GENTRANDOM, TOBRATROUGH, TOBRAPEAK, TOBRARND, AMIKACINPEAK, AMIKACINTROU, AMIKACIN in the last 168 hours.  Estimated Creatinine Clearance: 80.2 mL/min (by C-G formula based on SCr of 0.87 mg/dL).    Allergies  Allergen Reactions  . Baclofen     lethargic   . Simvastatin Nausea Only    Antimicrobials this admission: cefepime 2/10>>  vancomycin 2/10>>  metronidazole 2/10 >>x1 dose in ED   Microbiology results: 2/10 BCx2:  2/10 UCx:    2/10 Resp PCR: SARS CoV-2: negative           Flu A/B:  2/10 MRSA PCR:    Thank you for allowing pharmacy to be a part of this patient's care.  TDespina Pole2/04/2021 3:45 PM

## 2020-08-18 NOTE — ED Notes (Signed)
Date and time results received: 08/18/20 4:50 PM  (use smartphrase ".now" to insert current time)  Test: Lactic Critical Value: 4.0  Name of Provider Notified: Dr. Karle Starch  Orders Received? Or Actions Taken?: Re-draw lactic when fluids are done and notify MD if B/P doesn't come up.

## 2020-08-18 NOTE — Progress Notes (Signed)
Notified bedside nurse of need to administer fluid bolus, pt needs 2256 cc repeat lactate due @ 1735.

## 2020-08-18 NOTE — Progress Notes (Signed)
[facility] Progress Note Patient Name: [name] DOB: Jun 23, 1956 MRN: [id number]   Date of Service  08/18/2020  HPI/Events of Note  New patient evaluation. Patient is 51M with COPD and prior pulmonary MAC isolation in 2019 who presented to ED with dyspnea, cough and fever -- subsequently found to have LLL pneumonia on CXR. He was hypotensive and hypoxemic on arrival requiring 2.5L IVF resuscitation, norepinephrine infusion, and HFNC. He was given vanc/cefepime/flagyl by the ED.  ABG 7.29/42/70/BE -5.6.  Cr 1.52 (baseline ~0.8).  Lactate 4.0 -> 1.9 after fluid resuscitation.  On my exam by camera in the ICU, patient is ill-appearing and diaphoretic. Moderately labored respirations on Sal[place] HF. Sats 91% and HR 119. Norepinephrine at 11 mcg/min with BP 83/50 (MAP 58).   eICU Interventions  # Respiratory: LLL Pneumonia - Vanc/cefepime for severe PNA - Switch to true HFNC rather than Salter HF due to persistent increased respiratory effort and sats 90% on Salter - DuoNebs Q6H scheduled + Q4H PRN.  # Cardiac: - LR @ 150cc/hr for now. - Continue levophed with goal MAP 65.  # ID: - ABX as above. - F/u Blood and sputum cx.  # Renal: - AKI: S/p volume resuscitation.  # GI: - NPO for now.  # Endocrine: - Holding home diabetes medications. - Ordered for sensitive insulin sliding scale Q4H for now while NPO.  DVT PPX: Heparin [place] [facility] PPX: Not indicated     Intervention Category Evaluation Type: New Patient Evaluation  Charlott Rakes 08/18/2020, 9:58 PM

## 2020-08-18 NOTE — ED Notes (Signed)
ED TO INPATIENT HANDOFF REPORT  ED Nurse Name and Phone #:   S Name/Age/Gender [name] 65 y.o. male Room/Bed: APA09/APA09  Code Status   Code Status: Prior  Home/SNF/Other Home Patient oriented to: self, place, time and situation Is this baseline? Yes   Triage Complete: Triage complete  Chief Complaint Septic shock ([place]) [A41.9, R65.21]  Triage Note Pt from home via [facility]. Per pt he began to get [facility] and vomiting last night. Pt on 3L [place] chronically. Pt 84% on 3L at this time. Pt placed on 13L HFNC and O2 92%. Pt received 2 albuterol breathing tx and 175m solumedrol en route.     Allergies Allergies  Allergen Reactions  . Baclofen     lethargic   . Simvastatin Nausea Only    Level of Care/Admitting Diagnosis ED Disposition    [id number] Level of Care: ICU [6]  Covid Evaluation: Confirmed COVID Negative  Diagnosis: Septic shock ([facility]) [[id number]] Admitting Physician: EKara Pacer Attending Physician: EBethena Roys[[id number] Estimated length of stay: past midnight tomorrow  Certification:: I certify this patient will need inpatient services for at least 2 midnights       B Medical/Surgery History Past Medical History:  Diagnosis Date  . Anxiety   . Asthma   . Chest pain    a. 2003 Cath: nl cors.  .Marland KitchenCOPD (chronic obstructive pulmonary disease) ([place]   . Depression   . Diabetes mellitus   . Dyslipidemia   . Dysrhythmia   . Fracture 12/15   lower back "L-1"  . GERD (gastroesophageal reflux disease)   . Hypertension   . NASH (nonalcoholic steatohepatitis) 2018   [name], undergoing routine cirrhosis care  . PONV (postoperative nausea and vomiting)   . Splenomegaly    Past Surgical History:  Procedure Laterality Date  . BIOPSY  07/26/2016   Procedure: BIOPSY;  Surgeon: RDaneil Dolin MD;  [facility];  Service: Endoscopy;;   gastric  . CARDIAC CATHETERIZATION  10/22/2001   normal coronary arteriea (Dr. WDomenic Moras  . COLONOSCOPY  08/31/2011   RXKG:YJEHUDJSrectal and colon polyps-treated. Single tubular adenoma. Next TCS 08/2016  . COLONOSCOPY N/A 07/26/2016   Dr. RGala Romney diverticulosis in sigmoid and descending colon. 6 mm benign splenic flexure. surveillance 5 yeras  . ESOPHAGOGASTRODUODENOSCOPY N/A 07/26/2016   Dr. RGala Romney normal esophagus, portal gastropathy, chronic gastritis, normal duodenum, screening EGD 2 years   . ESOPHAGOGASTRODUODENOSCOPY (EGD) WITH ESOPHAGEAL DILATION N/A 05/15/2013   RHFW:YOVZCHesophagus-status post MVenia Minksdilation/Portal gastropathy. Antral erosions-status post biopsy. extrinsic compression along lesser curvature likely secondary to splenomegaly. gastric bx benign  . HERNIA REPAIR  03/2009  . POLYPECTOMY  07/26/2016   Procedure: POLYPECTOMY;  Surgeon: RDaneil Dolin MD;  [facility];  Service: Endoscopy;;  colon  . TRANSTHORACIC ECHOCARDIOGRAM  2011   EF 588-50% stage 1 diastolic dysfunction, trace TR & pulm valve regurg   . VASECTOMY  1989     A IV Location/Drains/Wounds Patient Lines/Drains/Airways Status    Active Line/Drains/Airways    Name Placement date Placement time Site Days   Peripheral IV 08/18/20 Anterior;Distal;Right;Upper Arm 08/18/20  1558  Arm  less than 1   Peripheral IV 08/18/20 Right Wrist 08/18/20  1549  Wrist  less than 1   Peripheral IV 08/18/20 Left Forearm 08/18/20  1545  Forearm  less than 1  External Urinary Catheter 08/18/20  1839  -  less than 1          Intake/Output Last 24 hours  Intake/Output Summary (Last 24 hours) at 08/18/2020 2112 Last data filed at 08/18/2020 1924 Gross per 24 hour  Intake 3050 ml  Output -  Net 3050 ml    Labs/Imaging Results for orders placed or performed during the hospital encounter of 08/18/20 (from the past 48 hour(s))  Resp Panel by RT-PCR (Flu A&B, Covid) Nasopharyngeal Swab     Status: None    Collection Time: 08/18/20  3:35 PM   Specimen: Nasopharyngeal Swab; Nasopharyngeal(NP) swabs in vial transport medium  Result Value Ref Range   SARS Coronavirus 2 by RT PCR NEGATIVE NEGATIVE    Comment: (NOTE) SARS-CoV-2 target nucleic acids are NOT DETECTED.  The SARS-CoV-2 RNA is generally detectable in upper respiratory specimens during the acute phase of infection. The lowest concentration of SARS-CoV-2 viral copies this assay can detect is 138 copies/mL. A negative result does not preclude SARS-Cov-2 infection and should not be used as the sole basis for treatment or other patient management decisions. A negative result may occur with  improper specimen collection/handling, submission of specimen other than nasopharyngeal swab, presence of viral mutation(s) within the areas targeted by this assay, and inadequate number of viral copies(<138 copies/mL). A negative result must be combined with clinical observations, patient history, and epidemiological information. The expected result is Negative.  Fact Sheet for Patients:  EntrepreneurPulse.com.au  Fact Sheet for Healthcare Providers:  IncredibleEmployment.be  This test is no t yet approved or cleared by the [place] FDA and  has been authorized for detection and/or diagnosis of SARS-CoV-2 by FDA under an Emergency Use Authorization (EUA). This EUA will remain  in effect (meaning this test can be used) for the duration of the COVID-19 declaration under Section 564(b)(1) of the Act, 21 U.S.C.section 360bbb-3(b)(1), unless the authorization is terminated  or revoked sooner.       Influenza A by PCR NEGATIVE NEGATIVE   Influenza B by PCR NEGATIVE NEGATIVE    Comment: (NOTE) The Xpert Xpress SARS-CoV-2/FLU/RSV plus assay is intended as an aid in the diagnosis of influenza from Nasopharyngeal swab specimens and should not be used as a sole basis for treatment. Nasal washings  and aspirates are unacceptable for Xpert Xpress SARS-CoV-2/FLU/RSV testing.  Fact Sheet for Patients: EntrepreneurPulse.com.au  Fact Sheet for Healthcare Providers: IncredibleEmployment.be  This test is not yet approved or cleared by the [place] FDA and has been authorized for detection and/or diagnosis of SARS-CoV-2 by FDA under an Emergency Use Authorization (EUA). This EUA will remain in effect (meaning this test can be used) for the duration of the COVID-19 declaration under Section 564(b)(1) of the Act, 21 U.S.C. section 360bbb-3(b)(1), unless the authorization is terminated or revoked.  Performed at [facility], [address] [place], [place] [address]   MRSA PCR Screening     Status: None   Collection Time: 08/18/20  3:35 PM   Specimen: Nasopharyngeal  Result Value Ref Range   MRSA by PCR NEGATIVE NEGATIVE    Comment:        The GeneXpert MRSA Assay (FDA approved for NASAL specimens only), is one component of a comprehensive MRSA colonization surveillance program. It is not intended to diagnose MRSA infection nor to guide or monitor treatment for MRSA infections. Performed at [facility], [address] [place],  [address]   POC SARS Coronavirus 2 Ag-ED -  Status: None   Collection Time: 08/18/20  3:38 PM  Result Value Ref Range   SARS Coronavirus 2 Ag NEGATIVE NEGATIVE    Comment: (NOTE) SARS-CoV-2 antigen NOT DETECTED.   Negative results are presumptive.  Negative results do not preclude SARS-CoV-2 infection and should not be used as the sole basis for treatment or other patient management decisions, including infection  control decisions, particularly in the presence of clinical signs and  symptoms consistent with COVID-19, or in those who have been in contact with the virus.  Negative results must be combined with clinical observations, patient history, and epidemiological information. The expected  result is Negative.  Fact Sheet for Patients: HandmadeRecipes.com.cy  Fact Sheet for Healthcare Providers: FuneralLife.at  This test is not yet approved or cleared by the [place] FDA and  has been authorized for detection and/or diagnosis of SARS-CoV-2 by FDA under an Emergency Use Authorization (EUA).  This EUA will remain in effect (meaning this test can be used) for the duration of  the COV ID-19 declaration under Section 564(b)(1) of the Act, 21 U.S.C. section 360bbb-3(b)(1), unless the authorization is terminated or revoked sooner.    Lactic acid, plasma     Status: Abnormal   Collection Time: 08/18/20  3:48 PM  Result Value Ref Range   Lactic Acid, Venous 4.0 (HH) 0.5 - 1.9 mmol/L    Comment: CRITICAL RESULT CALLED TO, READ BACK BY AND VERIFIED WITH: SITES,K ON 08/18/20 AT 1645 BY LOY,C Performed at [facility], [address], [place], [place] [address]   Comprehensive metabolic panel     Status: Abnormal   Collection Time: 08/18/20  3:48 PM  Result Value Ref Range   Sodium 134 (L) 135 - 145 mmol/L   Potassium 3.6 3.5 - 5.1 mmol/L   Chloride 102 98 - 111 mmol/L   CO2 21 (L) 22 - 32 mmol/L   Glucose, Bld 178 (H) 70 - 99 mg/dL    Comment: Glucose reference range applies only to samples taken after fasting for at least 8 hours.   BUN 22 8 - 23 mg/dL   Creatinine, Ser 1.52 (H) 0.61 - 1.24 mg/dL   Calcium 8.5 (L) 8.9 - 10.3 mg/dL   Total Protein 7.5 6.5 - 8.1 g/dL   Albumin 3.6 3.5 - 5.0 g/dL   AST 20 15 - 41 U/L   ALT 22 0 - 44 U/L   Alkaline Phosphatase 74 38 - 126 U/L   Total Bilirubin 2.7 (H) 0.3 - 1.2 mg/dL   GFR, Estimated 51 (L) >60 mL/min    Comment: (NOTE) Calculated using the CKD-EPI Creatinine Equation (2021)    Anion gap 11 5 - 15    Comment: Performed at [facility], [address] [place], [place] [address]  CBC WITH DIFFERENTIAL     Status: Abnormal   Collection Time: 08/18/20  3:48 PM  Result Value  Ref Range   WBC 5.8 4.0 - 10.5 K/uL   RBC 6.29 (H) 4.22 - 5.81 MIL/uL   Hemoglobin 16.4 13.0 - 17.0 g/dL   HCT 52.5 (H) 39.0 - 52.0 %   MCV 83.5 80.0 - 100.0 fL   MCH 26.1 26.0 - 34.0 pg   MCHC 31.2 30.0 - 36.0 g/dL   RDW 17.8 (H) 11.5 - 15.5 %   Platelets 108 (L) 150 - 400 K/uL    Comment: PLATELET COUNT CONFIRMED BY SMEAR SPECIMEN CHECKED FOR CLOTS Immature Platelet Fraction may be clinically indicated, consider ordering this additional test  [id number]    nRBC 0.3 (H) 0.0 - 0.2 %   Neutrophils Relative % 47 %   Neutro Abs 3.0 1.7 - 7.7 K/uL   Band Neutrophils 4 %   Lymphocytes Relative 27 %   Lymphs Abs 1.6 0.7 - 4.0 K/uL   Monocytes Relative 3 %   Monocytes Absolute 0.2 0.1 - 1.0 K/uL   Eosinophils Relative 2 %   Eosinophils Absolute 0.1 0.0 - 0.5 K/uL   Basophils Relative 0 %   Basophils Absolute 0.0 0.0 - 0.1 K/uL   WBC Morphology      MODERATE LEFT SHIFT (>5% METAS AND MYELOS,OCC PRO NOTED)   Other 2 %   Metamyelocytes Relative 11 %   Myelocytes 3 %   Promyelocytes Relative 1 %   Reactive, Benign Lymphocytes PRESENT    Dohle Bodies PRESENT     Comment: Performed at [facility], [address] [place], [place] [address]  Protime-INR     Status: None   Collection Time: 08/18/20  3:48 PM  Result Value Ref Range   Prothrombin Time 15.0 11.4 - 15.2 seconds   INR 1.2 0.8 - 1.2    Comment: (NOTE) INR goal varies based on device and disease states. Performed at [facility], [address] [place], [place] [address]   APTT     Status: None   Collection Time: 08/18/20  3:48 PM  Result Value Ref Range   aPTT 34 24 - 36 seconds    Comment: Performed at [facility], [address], [place], [place] [address]  Blood Culture (routine x 2)     Status: None (Preliminary result)   Collection Time: 08/18/20  3:48 PM   Specimen: BLOOD LEFT FOREARM  Result Value Ref Range   Specimen Description BLOOD LEFT FOREARM    Special Requests      BOTTLES DRAWN AEROBIC AND ANAEROBIC  Blood Culture adequate volume Performed at [facility], [address] [place], [place] [address]    Culture PENDING    Report Status PENDING   Blood Culture (routine x 2)     Status: None (Preliminary result)   Collection Time: 08/18/20  3:48 PM   Specimen: BLOOD RIGHT WRIST  Result Value Ref Range   Specimen Description BLOOD RIGHT WRIST    Special Requests      BOTTLES DRAWN AEROBIC AND ANAEROBIC Blood Culture adequate volume Performed at [facility], [address], [place], [place] [address]    Culture PENDING    Report Status PENDING   Lactic acid, plasma     Status: None   Collection Time: 08/18/20  5:17 PM  Result Value Ref Range   Lactic Acid, Venous 1.9 0.5 - 1.9 mmol/L    Comment: Performed at [facility], [address] [place], [place] [address]   DG Chest Port 1 View  Result Date: 08/18/2020 CLINICAL DATA:  Shortness of breath, weakness. EXAM: PORTABLE CHEST 1 VIEW COMPARISON:  July 22, 2020. FINDINGS: The heart size and mediastinal contours are within normal limits. No pneumothorax is noted. Large left lower lobe airspace opacity is noted consistent with pneumonia. Small left pleural effusion is noted. Stable calcified granuloma is noted in right upper lobe. The visualized skeletal structures are unremarkable. IMPRESSION: Large left lower lobe pneumonia. Small left pleural effusion. Electronically Signed   By: Marijo Conception M.D.   On: 08/18/2020 16:06    Pending Labs Unresulted Labs (From admission, onward)  Start     Ordered   08/18/20 1548  Pathologist smear review  Once,   R        08/18/20 1548   08/18/20 1535  Urinalysis, Routine w reflex microscopic  (Septic presentation on arrival (screening labs, nursing and treatment orders for obvious sepsis))  ONCE - STAT,   STAT        08/18/20 1535   08/18/20 1535  Urine culture  (Septic presentation on arrival (screening labs, nursing and treatment orders for obvious sepsis))  ONCE - STAT,   STAT         08/18/20 1535          Vitals/Pain Today's Vitals   08/18/20 2015 08/18/20 2030 08/18/20 2045 08/18/20 2100  BP: 91/60 92/64 (!) 93/58 92/63  Pulse: (!) 122 (!) 122 (!) 122 (!) 122  Resp: (!) 22 (!) 23 (!) 23 (!) 23  Temp:      TempSrc:      SpO2: 93% 94% 93% 93%  Weight:      Height:        Isolation Precautions No active isolations  Medications Medications  lactated ringers infusion ( Intravenous New Bag/Given 08/18/20 1834)  vancomycin (VANCOREADY) IVPB 1250 mg/250 mL (has no administration in time range)  ceFEPIme (MAXIPIME) 2 g in sodium chloride 0.9 % 100 mL IVPB (has no administration in time range)  0.9 %  sodium chloride infusion (has no administration in time range)  0.9 %  sodium chloride infusion (has no administration in time range)  norepinephrine (LEVOPHED) 30m in 259mpremix infusion (10 mcg/min Intravenous Rate/Dose Change 08/18/20 1941)  lactated ringers infusion ( Intravenous New Bag/Given 08/18/20 1934)  lactated ringers bolus 1,000 mL (0 mLs Intravenous Stopped 08/18/20 1721)    And  lactated ringers bolus 1,000 mL (0 mLs Intravenous Stopped 08/18/20 1830)    And  lactated ringers bolus 500 mL (0 mLs Intravenous Stopped 08/18/20 1830)  ceFEPIme (MAXIPIME) 2 g in sodium chloride 0.9 % 100 mL IVPB (0 g Intravenous Stopped 08/18/20 1642)  metroNIDAZOLE (FLAGYL) IVPB 500 mg (0 mg Intravenous Stopped 08/18/20 1721)  vancomycin (VANCOREADY) IVPB 1750 mg/350 mL (0 mg Intravenous Stopped 08/18/20 1924)    Mobility walks High fall risk   Focused Assessments    R Recommendations: See Admitting Provider Note  Report given to:   Additional Notes:

## 2020-08-19 ENCOUNTER — Ambulatory Visit: Payer: HMO | Admitting: Internal Medicine

## 2020-08-19 DIAGNOSIS — J449 Chronic obstructive pulmonary disease, unspecified: Secondary | ICD-10-CM

## 2020-08-19 DIAGNOSIS — J181 Lobar pneumonia, unspecified organism: Secondary | ICD-10-CM | POA: Diagnosis not present

## 2020-08-19 DIAGNOSIS — J9621 Acute and chronic respiratory failure with hypoxia: Secondary | ICD-10-CM

## 2020-08-19 DIAGNOSIS — R6521 Severe sepsis with septic shock: Secondary | ICD-10-CM

## 2020-08-19 DIAGNOSIS — A419 Sepsis, unspecified organism: Principal | ICD-10-CM

## 2020-08-19 LAB — GLUCOSE, CAPILLARY
Glucose-Capillary: 177 mg/dL — ABNORMAL HIGH (ref 70–99)
Glucose-Capillary: 152 mg/dL — ABNORMAL HIGH (ref 70–99)
Glucose-Capillary: 127 mg/dL — ABNORMAL HIGH (ref 70–99)
Glucose-Capillary: 141 mg/dL — ABNORMAL HIGH (ref 70–99)
Glucose-Capillary: 109 mg/dL — ABNORMAL HIGH (ref 70–99)
Glucose-Capillary: 122 mg/dL — ABNORMAL HIGH (ref 70–99)

## 2020-08-19 LAB — HEMOGLOBIN A1C
Hgb A1c MFr Bld: 7.3 % — ABNORMAL HIGH (ref 4.8–5.6)
Mean Plasma Glucose: 162.81 mg/dL

## 2020-08-19 LAB — COMPREHENSIVE METABOLIC PANEL
ALT: 26 U/L (ref 0–44)
AST: 32 U/L (ref 15–41)
Albumin: 3 g/dL — ABNORMAL LOW (ref 3.5–5.0)
Alkaline Phosphatase: 40 U/L (ref 38–126)
Anion gap: 11 (ref 5–15)
BUN: 20 mg/dL (ref 8–23)
CO2: 22 mmol/L (ref 22–32)
Calcium: 8.5 mg/dL — ABNORMAL LOW (ref 8.9–10.3)
Chloride: 107 mmol/L (ref 98–111)
Creatinine, Ser: 0.91 mg/dL (ref 0.61–1.24)
GFR, Estimated: >60 mL/min (ref >60)
Glucose, Bld: 192 mg/dL — ABNORMAL HIGH (ref 70–99)
Potassium: 4.5 mmol/L (ref 3.5–5.1)
Sodium: 140 mmol/L (ref 135–145)
Total Bilirubin: 1.6 mg/dL — ABNORMAL HIGH (ref 0.3–1.2)
Total Protein: 6.7 g/dL (ref 6.5–8.1)

## 2020-08-19 LAB — CBC
HCT: 47.6 % (ref 39.0–52.0)
Hemoglobin: 14.5 g/dL (ref 13.0–17.0)
MCH: 25.8 pg — ABNORMAL LOW (ref 26.0–34.0)
MCHC: 30.5 g/dL (ref 30.0–36.0)
MCV: 84.7 fL (ref 80.0–100.0)
Platelets: 113 10*3/uL — ABNORMAL LOW (ref 150–400)
RBC: 5.62 MIL/uL (ref 4.22–5.81)
RDW: 16.8 % — ABNORMAL HIGH (ref 11.5–15.5)
WBC: 13.8 10*3/uL — ABNORMAL HIGH (ref 4.0–10.5)
nRBC: 0 % (ref 0.0–0.2)

## 2020-08-19 LAB — PATHOLOGIST SMEAR REVIEW

## 2020-08-19 LAB — CORTISOL: Cortisol, Plasma: 8.7 ug/dL

## 2020-08-19 MED ORDER — FUROSEMIDE 10 MG/ML IJ SOLN
20.0000 mg | Freq: Once | INTRAMUSCULAR | Status: AC
  Administered 2020-08-19: 20 mg via INTRAVENOUS
  Filled 2020-08-19: qty 2

## 2020-08-19 MED ORDER — LACTATED RINGERS IV BOLUS
500.0000 mL | Freq: Once | INTRAVENOUS | Status: AC
  Administered 2020-08-19: 500 mL via INTRAVENOUS

## 2020-08-19 MED ORDER — MELATONIN 3 MG PO TABS
6.0000 mg | ORAL_TABLET | Freq: Every day | ORAL | Status: DC
  Administered 2020-08-19 – 2020-08-22 (×4): 6 mg via ORAL
  Filled 2020-08-19 (×4): qty 2

## 2020-08-19 MED ORDER — PANTOPRAZOLE SODIUM 40 MG PO TBEC
40.0000 mg | DELAYED_RELEASE_TABLET | Freq: Two times a day (BID) | ORAL | Status: DC
  Administered 2020-08-19 – 2020-08-23 (×8): 40 mg via ORAL
  Filled 2020-08-19 (×8): qty 1

## 2020-08-19 MED ORDER — CHLORHEXIDINE GLUCONATE CLOTH 2 % EX PADS
6.0000 | MEDICATED_PAD | Freq: Every day | CUTANEOUS | Status: DC
  Administered 2020-08-19 – 2020-08-23 (×5): 6 via TOPICAL

## 2020-08-19 MED ORDER — HYDROCORTISONE NA SUCCINATE PF 100 MG IJ SOLR
50.0000 mg | Freq: Two times a day (BID) | INTRAMUSCULAR | Status: DC
  Administered 2020-08-19 – 2020-08-22 (×6): 50 mg via INTRAVENOUS
  Filled 2020-08-19 (×6): qty 2

## 2020-08-19 MED ORDER — LACTATED RINGERS IV SOLN: INTRAVENOUS | Status: DC

## 2020-08-19 NOTE — Progress Notes (Addendum)
PROGRESS NOTE    [name]  FOY:[id number] DOB: Nov 29, 1955 DOA: 08/18/2020 PCP: Asencion Noble, MD    Brief Narrative:  [name] is a 65 yo male with a history of COPD (requiring 3L O2 at home), recurrent pneumonia, and history of mycobacterium avium complex who presented to the [facility] on 2/10 with increasing SOB, cough, fever, and vomiting. Found to be hypoxic by EMS. Admitted for septic shock. Started on Solu-Medrol, IVF, vancomycin, cefepime. In ICU, currently requiring Levophed and 13L HFNC.    Assessment & Plan:   Principal Problem:   Septic shock ([place]) Active Problems:   HTN (hypertension)   Type 2 diabetes mellitus without complication, without long-term current use of insulin ([name])   Liver fibrosis   Mycobacterium avium infection ([place])   COPD GOLD III   Chronic respiratory failure with hypoxia ([name])   Acute on chronic respiratory failure with hypoxia ([name])   Lobar pneumonia ([name])   Septic Shock: LLL pneumonia: Acute on chronic hypoxic respiratory failure: COPD GOLD III Remains tachycardic to 109, improved from 140s on admission. Currently requiring 13L HFNC. Pressures are improved (120/70 most recent reading) but still receiving Levophed drip at 9 mcg/min.   Lactic acidosis improving 4.0>1.9. WBC 5.8>13.8. Blood cultures with no growth <24 hours. Sputum studies pending. MRSA screen negative.  - PCCM to see patient  - Continue cefepime - OK to d/c vancomycin  - Continue LR 164m/hr  - DuoNeb q6 - Wean Levophed as tolerated; if continuing to require pressors, will place central line  AKI- resolved: Much improved s/p fluid resuscitation. Cr 1.52>0.9.  Diabetes: Glucose 177-192. Received 5u SAI. Currently NPO. Home meds include Jardiance, metformin, and sitagliptin.  - CBGs q4 - SSI-S  HTN: Home meds include diltiazem and tamsulosin - Holding meds in setting of hypotension  Depression/Anxiety: - Hold home Zoloft/Xanax  NASH- Stable: Mild abdominal  distention on exam. No fluid wave. LFTs normal.   DVT prophylaxis: heparin injection 5,000 Units Start: 08/18/20 2315  Code Status: Full Family Communication: Wife to visit in hospital Disposition Plan: Status is: Inpatient  Remains inpatient appropriate because:Hemodynamically unstable and Inpatient level of care appropriate due to severity of illness   Dispo: The patient is from: Home              Anticipated d/c is to: Home              Anticipated d/c date is: 3 days              Patient currently is not medically stable to d/c.   Difficult to place patient No   Consultants:   PCCM  Procedures:   None  Antimicrobials:   Cefepime (started 2/10)     Subjective: Mr. MGinreports that he "had a rough night" and that his dyspnea is only minimally improved. He is reporting subjective fever/chills this morning. Also reports feeling "achy all over."   Objective: Vitals:   08/19/20 0500 08/19/20 0515 08/19/20 0530 08/19/20 0600  BP: 119/68 130/84 120/90 120/70  Pulse: (!) 110 (!) 112 (!) 118 (!) 109  Resp: (!) 23 (!) 21 (!) 25 (!) 23  Temp:      TempSrc:      SpO2: 93% 95% 93% 94%  Weight:      Height:        Intake/Output Summary (Last 24 hours) at 08/19/2020 0700 Last data filed at 08/19/2020 0427 Gross per 24 hour  Intake 5006.28 ml  Output 2000 ml  Net 3006.28 ml   Filed Weights   08/18/20 1543 08/19/20 0330  Weight: 75.2 kg 76.8 kg    Examination:  Physical Exam Vitals reviewed.  Constitutional:      Appearance: He is not diaphoretic.  Cardiovascular:     Rate and Rhythm: Regular rhythm. Tachycardia present.     Heart sounds: No murmur heard.   Pulmonary:     Effort: Tachypnea and accessory muscle usage present.     Comments: On 13 L HFNC.  Diffuse Rhonchi on L side.  Increased WOB with even minimal speech. Speech is limited to 2-3 word phrases.  Abdominal:     General: Bowel sounds are normal.     Comments: Abdomen is mildly distended. No  fluid wave present.  Musculoskeletal:     Right lower leg: No edema.     Left lower leg: No edema.  Skin:    General: Skin is warm and dry.  Psychiatric:        Mood and Affect: Mood is anxious.        Behavior: Behavior is not agitated.       Data Reviewed: I have personally reviewed following labs and imaging studies  CBC: Recent Labs  Lab 08/18/20 1548 08/19/20 0451  WBC 5.8 13.8*  NEUTROABS 3.0  --   HGB 16.4 14.5  HCT 52.5* 47.6  MCV 83.5 84.7  PLT 108* 726*   Basic Metabolic Panel: Recent Labs  Lab 08/18/20 1548 08/19/20 0451  NA 134* 140  K 3.6 4.5  CL 102 107  CO2 21* 22  GLUCOSE 178* 192*  BUN 22 20  CREATININE 1.52* 0.91  CALCIUM 8.5* 8.5*   GFR: Estimated Creatinine Clearance: 76.7 mL/min (by C-G formula based on SCr of 0.91 mg/dL). Liver Function Tests: Recent Labs  Lab 08/18/20 1548 08/19/20 0451  AST 20 32  ALT 22 26  ALKPHOS 74 40  BILITOT 2.7* 1.6*  PROT 7.5 6.7  ALBUMIN 3.6 3.0*   No results for input(s): LIPASE, AMYLASE in the last 168 hours. No results for input(s): AMMONIA in the last 168 hours. Coagulation Profile: Recent Labs  Lab 08/18/20 1548  INR 1.2   Cardiac Enzymes: No results for input(s): CKTOTAL, CKMB, CKMBINDEX, TROPONINI in the last 168 hours. BNP (last 3 results) No results for input(s): PROBNP in the last 8760 hours. HbA1C: No results for input(s): HGBA1C in the last 72 hours. CBG: Recent Labs  Lab 08/18/20 2336 08/19/20 0355  GLUCAP 203* 177*   Lipid Profile: No results for input(s): CHOL, HDL, LDLCALC, TRIG, CHOLHDL, LDLDIRECT in the last 72 hours. Thyroid Function Tests: No results for input(s): TSH, T4TOTAL, FREET4, T3FREE, THYROIDAB in the last 72 hours. Anemia Panel: No results for input(s): VITAMINB12, FOLATE, FERRITIN, TIBC, IRON, RETICCTPCT in the last 72 hours. Sepsis Labs: Recent Labs  Lab 08/18/20 1548 08/18/20 1717  LATICACIDVEN 4.0* 1.9    Recent Results (from the past 240  hour(s))  Resp Panel by RT-PCR (Flu A&B, Covid) Nasopharyngeal Swab     Status: None   Collection Time: 08/18/20  3:35 PM   Specimen: Nasopharyngeal Swab; Nasopharyngeal(NP) swabs in vial transport medium  Result Value Ref Range Status   SARS Coronavirus 2 by RT PCR NEGATIVE NEGATIVE Final    Comment: (NOTE) SARS-CoV-2 target nucleic acids are NOT DETECTED.  The SARS-CoV-2 RNA is generally detectable in upper respiratory specimens during the acute phase of infection. The lowest concentration of SARS-CoV-2 viral copies this assay can detect is 138 copies/mL.  A negative result does not preclude SARS-Cov-2 infection and should not be used as the sole basis for treatment or other patient management decisions. A negative result may occur with  improper specimen collection/handling, submission of specimen other than nasopharyngeal swab, presence of viral mutation(s) within the areas targeted by this assay, and inadequate number of viral copies(<138 copies/mL). A negative result must be combined with clinical observations, patient history, and epidemiological information. The expected result is Negative.  Fact Sheet for Patients:  EntrepreneurPulse.com.au  Fact Sheet for Healthcare Providers:  IncredibleEmployment.be  This test is no t yet approved or cleared by the [place] FDA and  has been authorized for detection and/or diagnosis of SARS-CoV-2 by FDA under an Emergency Use Authorization (EUA). This EUA will remain  in effect (meaning this test can be used) for the duration of the COVID-19 declaration under Section 564(b)(1) of the Act, 21 U.S.C.section 360bbb-3(b)(1), unless the authorization is terminated  or revoked sooner.       Influenza A by PCR NEGATIVE NEGATIVE Final   Influenza B by PCR NEGATIVE NEGATIVE Final    Comment: (NOTE) The Xpert Xpress SARS-CoV-2/FLU/RSV plus assay is intended as an aid in the diagnosis of influenza from  Nasopharyngeal swab specimens and should not be used as a sole basis for treatment. Nasal washings and aspirates are unacceptable for Xpert Xpress SARS-CoV-2/FLU/RSV testing.  Fact Sheet for Patients: EntrepreneurPulse.com.au  Fact Sheet for Healthcare Providers: IncredibleEmployment.be  This test is not yet approved or cleared by the [place] FDA and has been authorized for detection and/or diagnosis of SARS-CoV-2 by FDA under an Emergency Use Authorization (EUA). This EUA will remain in effect (meaning this test can be used) for the duration of the COVID-19 declaration under Section 564(b)(1) of the Act, 21 U.S.C. section 360bbb-3(b)(1), unless the authorization is terminated or revoked.  Performed at [facility], [address] [place], [place] [address]   MRSA PCR Screening     Status: None   Collection Time: 08/18/20  3:35 PM   Specimen: Nasopharyngeal  Result Value Ref Range Status   MRSA by PCR NEGATIVE NEGATIVE Final    Comment:        The GeneXpert MRSA Assay (FDA approved for NASAL specimens only), is one component of a comprehensive MRSA colonization surveillance program. It is not intended to diagnose MRSA infection nor to guide or monitor treatment for MRSA infections. Performed at [facility], [address] [place], [place] [address]   Blood Culture (routine x 2)     Status: None (Preliminary result)   Collection Time: 08/18/20  3:48 PM   Specimen: BLOOD LEFT FOREARM  Result Value Ref Range Status   Specimen Description BLOOD LEFT FOREARM  Final   Special Requests   Final    BOTTLES DRAWN AEROBIC AND ANAEROBIC Blood Culture adequate volume Performed at [facility], [address], [place], [place] [address]    Culture PENDING  Incomplete   Report Status PENDING  Incomplete  Blood Culture (routine x 2)     Status: None (Preliminary result)   Collection Time: 08/18/20  3:48 PM   Specimen: BLOOD RIGHT WRIST   Result Value Ref Range Status   Specimen Description BLOOD RIGHT WRIST  Final   Special Requests   Final    BOTTLES DRAWN AEROBIC AND ANAEROBIC Blood Culture adequate volume Performed at [facility], [address] [place], [place] [address]    Culture PENDING  Incomplete   Report Status PENDING  Incomplete  Radiology Studies: DG Chest Port 1 View  Result Date: 08/18/2020 CLINICAL DATA:  Shortness of breath, weakness. EXAM: PORTABLE CHEST 1 VIEW COMPARISON:  July 22, 2020. FINDINGS: The heart size and mediastinal contours are within normal limits. No pneumothorax is noted. Large left lower lobe airspace opacity is noted consistent with pneumonia. Small left pleural effusion is noted. Stable calcified granuloma is noted in right upper lobe. The visualized skeletal structures are unremarkable. IMPRESSION: Large left lower lobe pneumonia. Small left pleural effusion. Electronically Signed   By: Marijo Conception M.D.   On: 08/18/2020 16:06        Scheduled Meds: . Chlorhexidine Gluconate Cloth  6 each Topical Daily  . heparin  5,000 Units Subcutaneous Q8H  . insulin aspart  0-9 Units Subcutaneous Q4H  . ipratropium-albuterol  3 mL Nebulization Q6H   Continuous Infusions: . ceFEPime (MAXIPIME) IV 2 g (08/18/20 2339)  . lactated ringers 150 mL/hr at 08/19/20 0501  . norepinephrine (LEVOPHED) Adult infusion 10 mcg/min (08/19/20 0053)  . vancomycin       LOS: 1 day    Time spent: 35 minutes    Pearla Dubonnet, Medical Student [facility]   If 7PM-7AM, please contact night-coverage [email or web address]  08/19/2020, 7:00 AM

## 2020-08-19 NOTE — Progress Notes (Signed)
bp up off pressors, am cortisol 7.8   rec Decrease IV fluid at this point, stress HC wean off x 3 days   Christinia Gully, MD Pulmonary and [place] Cell [phone]   After 7:00 pm call Elink  [phone]

## 2020-08-19 NOTE — Consult Note (Signed)
NAME:  [name], MRN:  [id number], DOB:  Dec 26, 1955, LOS: 1 ADMISSION DATE:  08/18/2020, CONSULTATION DATE:  2/11 REFERRING MD:  Zorita Pang  CHIEF COMPLAINT: sob/vomiting  Brief History:  34M with COPD GOLD III but 02 dep/ quit smoking 2012 MM phenotype  and prior pulmonary MAC isolation in 2019 who presented to [facility] ED pm 2/10  with dyspnea, cough and fever -- subsequently found to have LLL pneumonia on CXR. He was hypotensive and hypoxemic on arrival requiring 2.5L IVF resuscitation, norepinephrine infusion, and HFNC and rx per [facility] ICU on admit and formal PCCM consult.  History of Present Illness:   65 y.o. male with medical history significant for COPD with chronic respiratory failure on 3 L, ??  Mycobacterium avium infection, diabetes mellitus, hypertension, NASH. Patient was brought to the ED with complaints of fever and vomiting that started 2/9 pm.  Patient's wife is present at bedside and helps with the history.  She reports chronic difficulty breathing that was not significantly changed and baseline health 2/9 able to walk to MB s 02 and 3 hour p eating lunch at [facility]  patient had a fever of 101.4, so she gave him Tylenol and then profuse gagging/ vomiting s antecedent sinus symptoms, st, change in chronic minimal mucoid cough.  On EMS arrival, patient's O2 sats were 84% on 3 L, was placed on high flow nasal cannula.  ED Course: T-max 101, tachycardic to 140s, tachypneic to 30s, hypotension systolic down to 77/93.  O2 sats 91 to 93% on 13 L/min of high flow nasal cannula.  Lactic acidosis of 4 >> 1.9, after sepsis fluid bolus given.  Chest x-ray shows large left lower lobe pneumonia with small left pleural effusion.  WBC 5.8.  EKG sinus tachycardia rate 149.  UA pending.  Blood cultures obtained.  COVID test negative.  With persistent hypotension despite sepsis fluid bolus, patient was started on Levophed via peripheral line with improvement in blood pressure, systolic now  90Z. Broad-spectrum antibiotics IV Vanco cefepime and metronidazole started.     Past Medical History:  Anxiety Chest pain COPD Depression Diabetes mellitus  Dyslipidemia Dysrhythmia Fracture (12/15)  GERD  Hypertension  NASH (nonalcoholic steatohepatitis) (2018) PONV (postoperative nausea and vomiting)  Splenomegaly.    Significant Hospital Events:     Consults:  PCCM 2/10   Procedures:    Significant Diagnostic Tests:  EGD 07/2016 portal gastropathy, normal esophagus alpha one AT screen 01/31/17 MM Level 169 - PFT's 06/18/2019 FEV1 1.12 (35 % ) ratio 0.46 p 0 % improvement from saba p ? prior to study with DLCO 11.91 (48%) corrects to 3.18 (754%) for alv volume  ST eval  08/05/20  SLP Diet Recommendations Regular solids;Thin liquid  Liquid Administration via Straw;Cup  Medication Administration Whole meds with liquid  Compensations Slow rate;Small sips/bites  Postural Changes Seated upright at 90 degrees;Remain semi-upright after after feeds/meals (Comment)  Fasting Cortisol  Am 2/11 >>>   Micro Data:  MRSA  PCR   2/10  Neg  BC   X   2        2/10  >>> Urine strep      2/11  >>> Urine Legionella 2/11 >>  Antimicrobials:  Cefempime  2/10 Flagyl  2/10 Vanc   2/10   Scheduled Meds: . Chlorhexidine Gluconate Cloth  6 each Topical Daily  . heparin  5,000 Units Subcutaneous Q8H  . insulin aspart  0-9 Units Subcutaneous Q4H  . ipratropium-albuterol  3 mL  Nebulization Q6H  . pantoprazole  40 mg Oral BID AC   Continuous Infusions: . ceFEPime (MAXIPIME) IV Stopped (08/19/20 0827)  . lactated ringers 150 mL/hr at 08/19/20 0919  . norepinephrine (LEVOPHED) Adult infusion 4 mcg/min (08/19/20 1055)   PRN Meds:.acetaminophen **OR** acetaminophen, ondansetron **OR** ondansetron (ZOFRAN) IV    Interim History / Subjective:  C/o L cp with deep breath, still no purulent sputum, no more dizzy or nausea since 2/9 pm   Objective   Blood pressure 132/78, pulse  (!) 111, temperature 98.3 F (36.8 C), temperature source Oral, resp. rate (!) 21, height _0  (1.702 m), weight 76.8 kg, SpO2 98 %.        Intake/Output Summary (Last 24 hours) at 08/19/2020 1130 Last data filed at 08/19/2020 1055 Gross per 24 hour  Intake 6215.43 ml  Output 2000 ml  Net 4215.43 ml   Filed Weights   08/18/20 1543 08/19/20 0330  Weight: 75.2 kg 76.8 kg    Examination: Tmax  101    sats 94% on 13lpm HFNC General: acute and chronically ill wm HENT: clear Lungs: minimal insp/exp rhonchi Cardiovascular: RRR NSR on monitor  Abdomen: mod distended, non tender Extremities: warm s cyanosis or clubbing or edema Neuro: alert/oriented x 3 but poor recall of events     I personally reviewed images and agree with radiology impression as follows:  CXR:   08/20/19  Large left lower lobe pneumonia. Small left pleural effusion.    Resolved Hospital Problem list      Assessment & Plan:    1) Acute on chronic hypoxemic Resp failure in pt with severe copd and probable recurrent aspiration (h/o vomiting before decompensation) - agree with present abx - not completely clear the asp was the initial event as wife reports chills first and vomiting only p she gave him tylenol  - may be developing L parapneumonic effusion > CT may be needed vs u/s but for now just f/u with cxrs/ clinical exam    2) Severe copd / gold III/ MM phenotype  - no change chronic rx/ no need for steroids here but will check cortisol to be sure he isn't developing relative adrenal insuff due to past steroid rx   3)  Septic shock  - improved p fluids>> wean off levophed as tol   4) recurrent vomiting in diabetic on CCB/ anticholinergics leading to asp >>> d/c CCB  in favor of bisoprolol and consider gastroparesis w/u >>> also good tme to try off spiriva same issue     The patient is critically ill with multiple organ systems failure and requires high complexity decision making for assessment and  support, frequent evaluation and titration of therapies, application of advanced monitoring technologies and extensive interpretation of multiple databases. Critical Care Time devoted to patient care services described in this note is 45 minutes.    Christinia Gully, MD Pulmonary and [place] [phone]   After 7:00 pm call Elink  [phone]   Best practice (evaluated daily)  Diet:npo Pain/Anxiety/Delirium protocol (if indicated): n/a VAP protocol (if indicated): n/a DVT prophylaxis:  Sq hep GI prophylaxis: add ppi bid ac 2/11  Glucose control: per triad Mobility: up in chair  Disposition:icu/ full code      Labs   CBC: Recent Labs  Lab 08/18/20 1548 08/19/20 0451  WBC 5.8 13.8*  NEUTROABS 3.0  --   HGB 16.4 14.5  HCT 52.5* 47.6  MCV 83.5 84.7  PLT 108* 113*  Basic Metabolic Panel: Recent Labs  Lab 08/18/20 1548 08/19/20 0451  NA 134* 140  K 3.6 4.5  CL 102 107  CO2 21* 22  GLUCOSE 178* 192*  BUN 22 20  CREATININE 1.52* 0.91  CALCIUM 8.5* 8.5*   GFR: Estimated Creatinine Clearance: 76.7 mL/min (by C-G formula based on SCr of 0.91 mg/dL). Recent Labs  Lab 08/18/20 1548 08/18/20 1717 08/19/20 0451  WBC 5.8  --  13.8*  LATICACIDVEN 4.0* 1.9  --     Liver Function Tests: Recent Labs  Lab 08/18/20 1548 08/19/20 0451  AST 20 32  ALT 22 26  ALKPHOS 74 40  BILITOT 2.7* 1.6*  PROT 7.5 6.7  ALBUMIN 3.6 3.0*   No results for input(s): LIPASE, AMYLASE in the last 168 hours. No results for input(s): AMMONIA in the last 168 hours.  ABG    Component Value Date/Time   PHART 7.292 (L) 08/18/2020 2130   PCO2ART 42.4 08/18/2020 2130   PO2ART 70.1 (L) 08/18/2020 2130   HCO3 19.4 (L) 08/18/2020 2130   TCO2 30 11/14/2012 0801   ACIDBASEDEF 5.6 (H) 08/18/2020 2130   O2SAT 91.2 08/18/2020 2130     Coagulation Profile: Recent Labs  Lab 08/18/20 1548  INR 1.2    Cardiac Enzymes: No results for input(s):  CKTOTAL, CKMB, CKMBINDEX, TROPONINI in the last 168 hours.  HbA1C: Hgb A1c MFr Bld  Date/Time Value Ref Range Status  08/18/2020 05:17 PM 7.3 (H) 4.8 - 5.6 % Final    Comment:    (NOTE) Pre diabetes:          5.7%-6.4%  Diabetes:              >6.4%  Glycemic control for   <7.0% adults with diabetes   05/23/2020 10:00 AM 7.9 (H) 4.8 - 5.6 % Final    Comment:    (NOTE) Pre diabetes:          5.7%-6.4%  Diabetes:              >6.4%  Glycemic control for   <7.0% adults with diabetes     CBG: Recent Labs  Lab 08/18/20 2336 08/19/20 0355 08/19/20 0753  GLUCAP 203* 177* 152*         Surgical History:   Past Surgical History:  Procedure Laterality Date  . BIOPSY  07/26/2016   Procedure: BIOPSY;  Surgeon: [name], MD;  Location: [facility];  Service: Endoscopy;;  gastric  . CARDIAC CATHETERIZATION  10/22/2001   normal coronary arteriea (Dr. Domenic Moras)  . COLONOSCOPY  08/31/2011   CHE:NIDPOEUM rectal and colon polyps-treated. Single tubular adenoma. Next TCS 08/2016  . COLONOSCOPY N/A 07/26/2016   Dr. [name]: diverticulosis in sigmoid and descending colon. 6 mm benign splenic flexure. surveillance 5 yeras  . ESOPHAGOGASTRODUODENOSCOPY N/A 07/26/2016   Dr. [name]: normal esophagus, portal gastropathy, chronic gastritis, normal duodenum, screening EGD 2 years   . ESOPHAGOGASTRODUODENOSCOPY (EGD) WITH ESOPHAGEAL DILATION N/A 05/15/2013   PNT:IRWERX esophagus-status post Venia Minks dilation/Portal gastropathy. Antral erosions-status post biopsy. extrinsic compression along lesser curvature likely secondary to splenomegaly. gastric bx benign  . HERNIA REPAIR  03/2009  . POLYPECTOMY  07/26/2016   Procedure: POLYPECTOMY;  Surgeon: [name], MD;  Location: [facility];  Service: Endoscopy;;  colon  . TRANSTHORACIC ECHOCARDIOGRAM  2011   EF 54-00%, stage 1 diastolic dysfunction, trace TR & pulm valve regurg   . VASECTOMY  1989     Social History:   reports  that he  quit smoking about 9 years ago. His smoking use included cigarettes. He has a 45.00 pack-year smoking history. He has quit using smokeless tobacco. He reports that he does not drink alcohol and does not use drugs.   Family History:  His family history includes Arthritis in an other family member; Asthma in an other family member; Cancer in his mother; Cirrhosis (age of onset: 58) in his sister; Diabetes in his brother, mother, sister, and sister; Heart disease in his brother and mother; Hypertension in his brother; Kidney disease in an other family member; Lung disease in an other family member; Ovarian cancer in his sister. There is no history of Colon cancer.   Allergies Allergies  Allergen Reactions  . Baclofen     lethargic   . Simvastatin Nausea Only     Home Medications  Prior to Admission medications   Medication Sig Start Date End Date Taking? Authorizing Provider  acetaminophen (TYLENOL) 500 MG tablet Take 1,000 mg by mouth daily as needed for headache.    Yes [provider]  albuterol (PROVENTIL) (2.5 MG/3ML) 0.083% nebulizer solution Take 3 mLs (2.5 mg total) by nebulization every 2 (two) hours as needed for wheezing or shortness of breath (cough). 06/13/20  Yes Emokpae, Courage, MD  ALPRAZolam [name]) 0.25 MG tablet Take 0.25 mg by mouth 2 (two) times daily as needed for sleep.    Yes [provider]  aspirin EC 81 MG tablet Take 1 tablet (81 mg total) by mouth daily with breakfast. 06/13/20  Yes Emokpae, Courage, MD  atorvastatin (LIPITOR) 40 MG tablet Take 1 tablet (40 mg total) by mouth daily. 06/13/20  Yes Emokpae, Courage, MD  budesonide-formoterol (SYMBICORT) 160-4.5 MCG/ACT inhaler Inhale 2 puffs into the lungs 2 (two) times daily. 06/13/20  Yes Emokpae, Courage, MD  Cholecalciferol (VITAMIN D3) 25 MCG (1000 UT) CAPS Take 1 capsule by mouth daily.   Yes [provider]  [employer] VITAMIN C 250 MG tablet Take 250 mg by mouth daily. 06/13/20  Yes [provider]  diltiazem (CARDIZEM CD) 240 MG 24 hr capsule Take 1 capsule (240 mg total) by mouth daily. 06/13/20  Yes Emokpae, Courage, MD  furosemide (LASIX) 20 MG tablet Take 20 mg by mouth.   Yes [provider]  gabapentin (NEURONTIN) 300 MG capsule Take 1 capsule (300 mg total) by mouth at bedtime. 06/13/20  Yes Emokpae, Courage, MD  glimepiride (AMARYL) 2 MG tablet Take 1 tablet (2 mg total) by mouth daily with breakfast. Please do NOT take if you skip breakfast 06/13/20  Yes Emokpae, Courage, MD  JARDIANCE 25 MG TABS tablet Take 25 mg by mouth every evening.  01/01/17  Yes [provider]  Multiple Vitamin (MULTIVITAMIN WITH MINERALS) TABS tablet Take 1 tablet by mouth at bedtime. 06/13/20  Yes Emokpae, Courage, MD  OXYGEN Inhale 3 L into the lungs.   Yes [provider]  pantoprazole (PROTONIX) 40 MG tablet Take 1 tablet (40 mg total) by mouth daily. 07/14/20  Yes Aliene Altes S, PA-C  potassium chloride (MICRO-K) 10 MEQ CR capsule Take 10 mEq by mouth daily.   Yes [provider]  PROAIR HFA 108 (90 Base) MCG/ACT inhaler Inhale 2 puffs into the lungs every 4 (four) hours as needed for wheezing or shortness of breath. 06/13/20  Yes Emokpae, Courage, MD  sertraline (ZOLOFT) 50 MG tablet Take 1 tablet (50 mg total) by mouth daily. 06/13/20  Yes Roxan Hockey, MD  SitaGLIPtin-MetFORMIN HCl [phone]  MG TB24 Take 1 tablet by mouth daily.   Yes [provider]  tamsulosin (FLOMAX) 0.4 MG CAPS capsule Take 0.4 mg by mouth daily.   Yes [provider]  thiamine 100 MG tablet Take 1 tablet (100 mg total) by mouth daily. 06/14/20  Yes Emokpae, Courage, MD  Tiotropium Bromide Monohydrate (SPIRIVA RESPIMAT) 2.5 MCG/ACT AERS Inhale 2 puffs into the lungs daily. 06/13/20  Yes Emokpae, Courage, MD  guaiFENesin (MUCINEX) 600 MG 12 hr tablet Take 1 tablet (600 mg total) by mouth 2 (two) times daily. Patient not taking: Reported on 08/18/2020 06/13/20    Roxan Hockey, MD  [facility] LANCETS [id number] MISC  08/26/18   [provider]  [facility] VERIO test strip  08/26/18   [provider]  VITAMIN D PO Take 1 tablet by mouth daily. Patient not taking: Reported on 08/18/2020    [provider]

## 2020-08-20 ENCOUNTER — Inpatient Hospital Stay (HOSPITAL_COMMUNITY): Payer: HMO

## 2020-08-20 DIAGNOSIS — A419 Sepsis, unspecified organism: Secondary | ICD-10-CM | POA: Diagnosis not present

## 2020-08-20 DIAGNOSIS — R6521 Severe sepsis with septic shock: Secondary | ICD-10-CM | POA: Diagnosis not present

## 2020-08-20 LAB — CBC
HCT: 41.4 % (ref 39.0–52.0)
Hemoglobin: 13 g/dL (ref 13.0–17.0)
MCH: 26.4 pg (ref 26.0–34.0)
MCHC: 31.4 g/dL (ref 30.0–36.0)
MCV: 84 fL (ref 80.0–100.0)
Platelets: 82 10*3/uL — ABNORMAL LOW (ref 150–400)
RBC: 4.93 MIL/uL (ref 4.22–5.81)
RDW: 16.4 % — ABNORMAL HIGH (ref 11.5–15.5)
WBC: 7.4 10*3/uL (ref 4.0–10.5)
nRBC: 0 % (ref 0.0–0.2)

## 2020-08-20 LAB — COMPREHENSIVE METABOLIC PANEL
ALT: 25 U/L (ref 0–44)
AST: 21 U/L (ref 15–41)
Albumin: 2.8 g/dL — ABNORMAL LOW (ref 3.5–5.0)
Alkaline Phosphatase: 45 U/L (ref 38–126)
Anion gap: 8 (ref 5–15)
BUN: 23 mg/dL (ref 8–23)
CO2: 27 mmol/L (ref 22–32)
Calcium: 8.6 mg/dL — ABNORMAL LOW (ref 8.9–10.3)
Chloride: 102 mmol/L (ref 98–111)
Creatinine, Ser: 0.72 mg/dL (ref 0.61–1.24)
GFR, Estimated: >60 mL/min (ref >60)
Glucose, Bld: 154 mg/dL — ABNORMAL HIGH (ref 70–99)
Potassium: 3.8 mmol/L (ref 3.5–5.1)
Sodium: 137 mmol/L (ref 135–145)
Total Bilirubin: 1.4 mg/dL — ABNORMAL HIGH (ref 0.3–1.2)
Total Protein: 6.7 g/dL (ref 6.5–8.1)

## 2020-08-20 LAB — GLUCOSE, CAPILLARY
Glucose-Capillary: 150 mg/dL — ABNORMAL HIGH (ref 70–99)
Glucose-Capillary: 121 mg/dL — ABNORMAL HIGH (ref 70–99)
Glucose-Capillary: 164 mg/dL — ABNORMAL HIGH (ref 70–99)
Glucose-Capillary: 132 mg/dL — ABNORMAL HIGH (ref 70–99)
Glucose-Capillary: 139 mg/dL — ABNORMAL HIGH (ref 70–99)

## 2020-08-20 LAB — MAGNESIUM: Magnesium: 2 mg/dL (ref 1.7–2.4)

## 2020-08-20 LAB — PROCALCITONIN: Procalcitonin: 57.96 ng/mL

## 2020-08-20 MED ORDER — GABAPENTIN 300 MG PO CAPS
300.0000 mg | ORAL_CAPSULE | Freq: Every day | ORAL | Status: DC
  Administered 2020-08-20 – 2020-08-22 (×3): 300 mg via ORAL
  Filled 2020-08-20 (×3): qty 1

## 2020-08-20 MED ORDER — ASPIRIN EC 81 MG PO TBEC
81.0000 mg | DELAYED_RELEASE_TABLET | Freq: Every day | ORAL | Status: DC
Start: 2020-08-20 — End: 2020-08-23
  Administered 2020-08-20 – 2020-08-23 (×4): 81 mg via ORAL
  Filled 2020-08-20 (×4): qty 1

## 2020-08-20 MED ORDER — ALPRAZOLAM 0.25 MG PO TABS
0.2500 mg | ORAL_TABLET | Freq: Two times a day (BID) | ORAL | Status: DC | PRN
  Administered 2020-08-20 – 2020-08-23 (×6): 0.25 mg via ORAL
  Filled 2020-08-20 (×6): qty 1

## 2020-08-20 MED ORDER — IPRATROPIUM-ALBUTEROL 0.5-2.5 (3) MG/3ML IN SOLN
3.0000 mL | Freq: Four times a day (QID) | RESPIRATORY_TRACT | Status: DC | PRN
  Administered 2020-08-20 – 2020-08-21 (×2): 3 mL via RESPIRATORY_TRACT
  Filled 2020-08-20 (×2): qty 3

## 2020-08-20 MED ORDER — GUAIFENESIN-DM 100-10 MG/5ML PO SYRP
5.0000 mL | ORAL_SOLUTION | ORAL | Status: DC | PRN
  Administered 2020-08-20 – 2020-08-22 (×8): 5 mL via ORAL
  Filled 2020-08-20 (×8): qty 5

## 2020-08-20 MED ORDER — ATORVASTATIN CALCIUM 40 MG PO TABS
40.0000 mg | ORAL_TABLET | Freq: Every day | ORAL | Status: DC
  Administered 2020-08-20 – 2020-08-23 (×4): 40 mg via ORAL
  Filled 2020-08-20 (×4): qty 1

## 2020-08-20 MED ORDER — PANTOPRAZOLE SODIUM 40 MG PO TBEC: 40.0000 mg | DELAYED_RELEASE_TABLET | Freq: Every day | ORAL | Status: DC

## 2020-08-20 MED ORDER — DILTIAZEM HCL 60 MG PO TABS
60.0000 mg | ORAL_TABLET | Freq: Three times a day (TID) | ORAL | Status: DC
  Administered 2020-08-20 – 2020-08-21 (×3): 60 mg via ORAL
  Filled 2020-08-20 (×3): qty 1

## 2020-08-20 NOTE — Progress Notes (Signed)
RT placed on BIPAP to assist in comfort and rest while he sleep. Patient got worked up while going to bathroom and became SOB. Patient was given HHN and then placed on BIPAP with settings 14/5 with 7lpm [place] bled in. Rt will continue to monitor patient through the shift for any changes.

## 2020-08-20 NOTE — Progress Notes (Addendum)
PROGRESS NOTE    [name]  VPX:[id number] DOB: 1955/07/11 DOA: 08/18/2020 PCP: Asencion Noble, MD   Brief Narrative:  Mr. Laduke is a 65 yo male with a history of COPD (requiring 3L O2 at home), recurrent pneumonia, and history of mycobacterium avium complex who presented to [facility] on 2/10 with increasing SOB, cough, fever, and vomiting. Found to be hypoxic by EMS. Admitted for septic shock. Started on Solu-Medrol, IVF, vancomycin, cefepime. In ICU, currently requiring Levophed and 13L HFNC.   -He currently has stable blood pressure readings and is on some stress dose hydrocortisone as ordered by PCCM, but has been weaned off of Levophed.  He on 12 L high flow nasal cannula oxygen which will need further weaning.  Assessment & Plan:   Principal Problem:   Septic shock ([place]) Active Problems:   HTN (hypertension)   Type 2 diabetes mellitus without complication, without long-term current use of insulin ([name])   Liver fibrosis   Mycobacterium avium infection ([place])   COPD GOLD III   Chronic respiratory failure with hypoxia ([name])   Acute on chronic respiratory failure with hypoxia ([name])   Lobar pneumonia ([name])   Acute on chronic hypoxemic respiratory failure as well as septic shock present on admission secondary to left lower lobe pneumonia -Continue on cefepime with no growth on blood cultures noted -Appreciate pulmonology evaluation -Noted effusion with infiltrate on chest x-ray redemonstrated on 2/12, plan to obtain repeat chest x-ray by 2/14 or sooner if needed -Flutter valve, incentive spirometry, up in chair -Continue hydrocortisone and plan to wean off by 2/14 or sooner as BP elevates  AKI-resolved -Continue to monitor labs  Type 2 diabetes -Continue current regimen with SSI and monitor -No significant hyperglycemia noted -Holding home oral medications for the time being  Hypertension -Plan to resume diltiazem at lower dose today and monitor  Depression/anxiety -Plan  to resume Xanax and Zoloft  History of COPD -No sign of acute exacerbation currently noted -Plan to follow-up with pulmonology outpatient  NASH cirrhosis  DVT prophylaxis: Heparin Code Status: Full Family Communication: Wife at bedside Disposition Plan:  Status is: Inpatient  Remains inpatient appropriate because:IV treatments appropriate due to intensity of illness or inability to take PO and Inpatient level of care appropriate due to severity of illness   Dispo: The patient is from: Home              Anticipated d/c is to: Home              Anticipated d/c date is: 3 days              Patient currently is not medically stable to d/c.   Difficult to place patient No   Consultants:   Pulmonology  Procedures:   As below  Antimicrobials:  Anti-infectives (From admission, onward)   Start     Dose/Rate Route Frequency Ordered Stop   08/19/20 1700  vancomycin (VANCOREADY) IVPB 1250 mg/250 mL  Status:  Discontinued        1,250 mg 166.7 mL/hr over 90 Minutes Intravenous Every 24 hours 08/18/20 1542 08/19/20 0800   08/19/20 0000  ceFEPIme (MAXIPIME) 2 g in sodium chloride 0.9 % 100 mL IVPB        2 g 200 mL/hr over 30 Minutes Intravenous Every 8 hours 08/18/20 1543     08/18/20 1700  vancomycin (VANCOREADY) IVPB 1750 mg/350 mL        1,750 mg 175 mL/hr over 120 Minutes  Intravenous  Once 08/18/20 1538 08/18/20 1924   08/18/20 1600  ceFEPIme (MAXIPIME) 2 g in sodium chloride 0.9 % 100 mL IVPB  Status:  Discontinued        2 g 200 mL/hr over 30 Minutes Intravenous Every 8 hours 08/18/20 1542 08/18/20 1543   08/18/20 1545  ceFEPIme (MAXIPIME) 2 g in sodium chloride 0.9 % 100 mL IVPB        2 g 200 mL/hr over 30 Minutes Intravenous  Once 08/18/20 1535 08/18/20 1642   08/18/20 1545  metroNIDAZOLE (FLAGYL) IVPB 500 mg        500 mg 100 mL/hr over 60 Minutes Intravenous  Once 08/18/20 1535 08/18/20 1721   08/18/20 1545  vancomycin (VANCOCIN) IVPB 1000 mg/200 mL premix  Status:   Discontinued        1,000 mg 200 mL/hr over 60 Minutes Intravenous  Once 08/18/20 1535 08/18/20 1537       Subjective: Patient seen and evaluated today with no new acute complaints or concerns. No acute concerns or events noted overnight.  Objective: Vitals:   08/20/20 0900 08/20/20 1000 08/20/20 1011 08/20/20 1100  BP:      Pulse: (!) 103 100  (!) 106  Resp: (!) 25 (!) 22  (!) 28  Temp:      TempSrc:      SpO2: 92% 98% 94% 100%  Weight:      Height:        Intake/Output Summary (Last 24 hours) at 08/20/2020 1150 Last data filed at 08/20/2020 0600 Gross per 24 hour  Intake 788.54 ml  Output 2250 ml  Net -1461.46 ml   Filed Weights   08/18/20 1543 08/19/20 0330  Weight: 75.2 kg 76.8 kg    Examination:  General exam: Appears calm and comfortable  Respiratory system: Clear to auscultation.  Slightly diminished left lower base.  Respiratory effort normal.  Currently on 12 L nasal cannula. Cardiovascular system: S1 & S2 heard, RRR.  Gastrointestinal system: Abdomen is soft Central nervous system: Alert and awake Extremities: No edema Skin: No significant lesions noted Psychiatry: Flat affect.    Data Reviewed: I have personally reviewed following labs and imaging studies  CBC: Recent Labs  Lab 08/18/20 1548 08/19/20 0451 08/20/20 0533  WBC 5.8 13.8* 7.4  NEUTROABS 3.0  --   --   HGB 16.4 14.5 13.0  HCT 52.5* 47.6 41.4  MCV 83.5 84.7 84.0  PLT 108* 113* 82*   Basic Metabolic Panel: Recent Labs  Lab 08/18/20 1548 08/19/20 0451 08/20/20 0533  NA 134* 140 137  K 3.6 4.5 3.8  CL 102 107 102  CO2 21* 22 27  GLUCOSE 178* 192* 154*  BUN _0 CREATININE 1.52* 0.91 0.72  CALCIUM 8.5* 8.5* 8.6*  MG  --   --  2.0   GFR: Estimated Creatinine Clearance: 87.2 mL/min (by C-G formula based on SCr of 0.72 mg/dL). Liver Function Tests: Recent Labs  Lab 08/18/20 1548 08/19/20 0451 08/20/20 0533  AST 20 32 21  ALT _1 ALKPHOS 74 40 45  BILITOT  2.7* 1.6* 1.4*  PROT 7.5 6.7 6.7  ALBUMIN 3.6 3.0* 2.8*   No results for input(s): LIPASE, AMYLASE in the last 168 hours. No results for input(s): AMMONIA in the last 168 hours. Coagulation Profile: Recent Labs  Lab 08/18/20 1548  INR 1.2   Cardiac Enzymes: No results for input(s): CKTOTAL, CKMB, CKMBINDEX, TROPONINI in the last 168 hours. BNP (last  3 results) No results for input(s): PROBNP in the last 8760 hours. HbA1C: Recent Labs    08/18/20 1717  HGBA1C 7.3*   CBG: Recent Labs  Lab 08/19/20 1602 08/19/20 2213 08/20/20 0537 08/20/20 0832 08/20/20 1130  GLUCAP 109* 122* 150* 121* 164*   Lipid Profile: No results for input(s): CHOL, HDL, LDLCALC, TRIG, CHOLHDL, LDLDIRECT in the last 72 hours. Thyroid Function Tests: No results for input(s): TSH, T4TOTAL, FREET4, T3FREE, THYROIDAB in the last 72 hours. Anemia Panel: No results for input(s): VITAMINB12, FOLATE, FERRITIN, TIBC, IRON, RETICCTPCT in the last 72 hours. Sepsis Labs: Recent Labs  Lab 08/18/20 1548 08/18/20 1717 08/20/20 0533  PROCALCITON  --   --  57.96  LATICACIDVEN 4.0* 1.9  --     Recent Results (from the past 240 hour(s))  Resp Panel by RT-PCR (Flu A&B, Covid) Nasopharyngeal Swab     Status: None   Collection Time: 08/18/20  3:35 PM   Specimen: Nasopharyngeal Swab; Nasopharyngeal(NP) swabs in vial transport medium  Result Value Ref Range Status   SARS Coronavirus 2 by RT PCR NEGATIVE NEGATIVE Final    Comment: (NOTE) SARS-CoV-2 target nucleic acids are NOT DETECTED.  The SARS-CoV-2 RNA is generally detectable in upper respiratory specimens during the acute phase of infection. The lowest concentration of SARS-CoV-2 viral copies this assay can detect is 138 copies/mL. A negative result does not preclude SARS-Cov-2 infection and should not be used as the sole basis for treatment or other patient management decisions. A negative result may occur with  improper specimen collection/handling,  submission of specimen other than nasopharyngeal swab, presence of viral mutation(s) within the areas targeted by this assay, and inadequate number of viral copies(<138 copies/mL). A negative result must be combined with clinical observations, patient history, and epidemiological information. The expected result is Negative.  Fact Sheet for Patients:  EntrepreneurPulse.com.au  Fact Sheet for Healthcare Providers:  IncredibleEmployment.be  This test is no t yet approved or cleared by the [place] FDA and  has been authorized for detection and/or diagnosis of SARS-CoV-2 by FDA under an Emergency Use Authorization (EUA). This EUA will remain  in effect (meaning this test can be used) for the duration of the COVID-19 declaration under Section 564(b)(1) of the Act, 21 U.S.C.section 360bbb-3(b)(1), unless the authorization is terminated  or revoked sooner.       Influenza A by PCR NEGATIVE NEGATIVE Final   Influenza B by PCR NEGATIVE NEGATIVE Final    Comment: (NOTE) The Xpert Xpress SARS-CoV-2/FLU/RSV plus assay is intended as an aid in the diagnosis of influenza from Nasopharyngeal swab specimens and should not be used as a sole basis for treatment. Nasal washings and aspirates are unacceptable for Xpert Xpress SARS-CoV-2/FLU/RSV testing.  Fact Sheet for Patients: EntrepreneurPulse.com.au  Fact Sheet for Healthcare Providers: IncredibleEmployment.be  This test is not yet approved or cleared by the [place] FDA and has been authorized for detection and/or diagnosis of SARS-CoV-2 by FDA under an Emergency Use Authorization (EUA). This EUA will remain in effect (meaning this test can be used) for the duration of the COVID-19 declaration under Section 564(b)(1) of the Act, 21 U.S.C. section 360bbb-3(b)(1), unless the authorization is terminated or revoked.  Performed at [facility], [address] [place], [place] [address]   MRSA PCR Screening     Status: None   Collection Time: 08/18/20  3:35 PM   Specimen: Nasopharyngeal  Result Value Ref Range Status   MRSA by PCR NEGATIVE NEGATIVE Final  Comment:        The GeneXpert MRSA Assay (FDA approved for NASAL specimens only), is one component of a comprehensive MRSA colonization surveillance program. It is not intended to diagnose MRSA infection nor to guide or monitor treatment for MRSA infections. Performed at [facility], [address], [place], [place] [address]   Blood Culture (routine x 2)     Status: None (Preliminary result)   Collection Time: 08/18/20  3:48 PM   Specimen: BLOOD LEFT FOREARM  Result Value Ref Range Status   Specimen Description BLOOD LEFT FOREARM  Final   Special Requests   Final    BOTTLES DRAWN AEROBIC AND ANAEROBIC Blood Culture adequate volume   Culture   Final    NO GROWTH 2 DAYS Performed at [facility], [address], [place], [place] [address]    Report Status PENDING  Incomplete  Blood Culture (routine x 2)     Status: None (Preliminary result)   Collection Time: 08/18/20  3:48 PM   Specimen: BLOOD RIGHT WRIST  Result Value Ref Range Status   Specimen Description BLOOD RIGHT WRIST  Final   Special Requests   Final    BOTTLES DRAWN AEROBIC AND ANAEROBIC Blood Culture adequate volume   Culture   Final    NO GROWTH 2 DAYS Performed at [facility], [address] [place], [place] [address]    Report Status PENDING  Incomplete         Radiology Studies: DG Chest Port 1 View  Result Date: 08/20/2020 CLINICAL DATA:  65 year old male with shortness of breath, weakness. Evidence of left lung pneumonia and effusion on recent portable chest. EXAM: PORTABLE CHEST 1 VIEW COMPARISON:  Portable chest 08/18/2020 and earlier. FINDINGS: Portable AP upright view at 0613 hours. Underlying large lung volumes. Stable cardiac size and mediastinal contours. Coarse, chronic pulmonary interstitial  opacity in both lungs appears stable since last year with large calcified right upper lobe granuloma. Superimposed confluent airspace disease in the left lower lung with probable pleural effusion. The degree of left lung opacity has mildly progressed since yesterday. Only the apex appears completely spared now. Paucity of bowel gas. Stable visualized osseous structures. IMPRESSION: Chronic lung disease with superimposed confluent left lower lung pneumonia, associated pleural effusion which have mildly progressed from yesterday. Electronically Signed   By: Genevie Ann M.D.   On: 08/20/2020 07:49   DG Chest Port 1 View  Result Date: 08/18/2020 CLINICAL DATA:  Shortness of breath, weakness. EXAM: PORTABLE CHEST 1 VIEW COMPARISON:  July 22, 2020. FINDINGS: The heart size and mediastinal contours are within normal limits. No pneumothorax is noted. Large left lower lobe airspace opacity is noted consistent with pneumonia. Small left pleural effusion is noted. Stable calcified granuloma is noted in right upper lobe. The visualized skeletal structures are unremarkable. IMPRESSION: Large left lower lobe pneumonia. Small left pleural effusion. Electronically Signed   By: Marijo Conception M.D.   On: 08/18/2020 16:06        Scheduled Meds: . aspirin EC  81 mg Oral Q breakfast  . atorvastatin  40 mg Oral Daily  . Chlorhexidine Gluconate Cloth  6 each Topical Daily  . gabapentin  300 mg Oral QHS  . heparin  5,000 Units Subcutaneous Q8H  . hydrocortisone sodium succinate  50 mg Intravenous Q12H  . insulin aspart  0-9 Units Subcutaneous Q4H  . ipratropium-albuterol  3 mL Nebulization Q6H  . melatonin  6 mg Oral QHS  . pantoprazole  40 mg Oral BID AC   Continuous Infusions: . ceFEPime (MAXIPIME) IV 2 g (08/20/20 0851)     LOS: 2 days    Time spent: 35 minutes    Michaeal Davis D Manuella Ghazi, DO [facility]  If 7PM-7AM, please contact night-coverage [email or web address] 08/20/2020, 11:50 AM

## 2020-08-21 DIAGNOSIS — R6521 Severe sepsis with septic shock: Secondary | ICD-10-CM | POA: Diagnosis not present

## 2020-08-21 DIAGNOSIS — A419 Sepsis, unspecified organism: Secondary | ICD-10-CM | POA: Diagnosis not present

## 2020-08-21 LAB — GLUCOSE, CAPILLARY
Glucose-Capillary: 108 mg/dL — ABNORMAL HIGH (ref 70–99)
Glucose-Capillary: 137 mg/dL — ABNORMAL HIGH (ref 70–99)
Glucose-Capillary: 151 mg/dL — ABNORMAL HIGH (ref 70–99)
Glucose-Capillary: 154 mg/dL — ABNORMAL HIGH (ref 70–99)
Glucose-Capillary: 172 mg/dL — ABNORMAL HIGH (ref 70–99)
Glucose-Capillary: 197 mg/dL — ABNORMAL HIGH (ref 70–99)

## 2020-08-21 LAB — CBC
HCT: 42.4 % (ref 39.0–52.0)
Hemoglobin: 13.1 g/dL (ref 13.0–17.0)
MCH: 25.7 pg — ABNORMAL LOW (ref 26.0–34.0)
MCHC: 30.9 g/dL (ref 30.0–36.0)
MCV: 83.1 fL (ref 80.0–100.0)
Platelets: 99 10*3/uL — ABNORMAL LOW (ref 150–400)
RBC: 5.1 MIL/uL (ref 4.22–5.81)
RDW: 16.1 % — ABNORMAL HIGH (ref 11.5–15.5)
WBC: 10.3 10*3/uL (ref 4.0–10.5)
nRBC: 0 % (ref 0.0–0.2)

## 2020-08-21 LAB — MAGNESIUM: Magnesium: 2 mg/dL (ref 1.7–2.4)

## 2020-08-21 LAB — BASIC METABOLIC PANEL
Anion gap: 11 (ref 5–15)
BUN: 18 mg/dL (ref 8–23)
CO2: 25 mmol/L (ref 22–32)
Calcium: 8.7 mg/dL — ABNORMAL LOW (ref 8.9–10.3)
Chloride: 102 mmol/L (ref 98–111)
Creatinine, Ser: 0.59 mg/dL — ABNORMAL LOW (ref 0.61–1.24)
GFR, Estimated: >60 mL/min (ref >60)
Glucose, Bld: 141 mg/dL — ABNORMAL HIGH (ref 70–99)
Potassium: 3.3 mmol/L — ABNORMAL LOW (ref 3.5–5.1)
Sodium: 138 mmol/L (ref 135–145)

## 2020-08-21 LAB — PROCALCITONIN: Procalcitonin: 33.76 ng/mL

## 2020-08-21 MED ORDER — POTASSIUM CHLORIDE CRYS ER 20 MEQ PO TBCR
40.0000 meq | EXTENDED_RELEASE_TABLET | Freq: Once | ORAL | Status: AC
  Administered 2020-08-21: 40 meq via ORAL
  Filled 2020-08-21: qty 2

## 2020-08-21 MED ORDER — DILTIAZEM HCL 30 MG PO TABS: 30.0000 mg | ORAL_TABLET | Freq: Three times a day (TID) | ORAL | Status: DC

## 2020-08-21 NOTE — Progress Notes (Signed)
PROGRESS NOTE    MARTICE DOTY  JGO:[id number] DOB: 28-Mar-1956 DOA: 08/18/2020 PCP: Asencion Noble, MD   Brief Narrative:  Mr. Danzy is a 65 yo male with a history of COPD (requiring 3L O2 at home), recurrent pneumonia, and history of mycobacterium avium complex who presented to [facility] on 2/10 with increasing SOB, cough, fever, and vomiting. Found to be hypoxic by EMS.Admitted for septic shock.Started on Solu-Medrol, IVF, vancomycin, cefepime. InICU, currently requiring Levophed and 13L HFNC.   -He currently has stable blood pressure readings and is on some stress dose hydrocortisone as ordered by PCCM, but has been weaned off of Levophed.  He on 9 L high flow nasal cannula oxygen which will need further weaning to his baseline of 3L.  Assessment & Plan:   Principal Problem:   Septic shock ([place]) Active Problems:   HTN (hypertension)   Type 2 diabetes mellitus without complication, without long-term current use of insulin ([name])   Liver fibrosis   Mycobacterium avium infection ([place])   COPD GOLD III   Chronic respiratory failure with hypoxia ([name])   Acute on chronic respiratory failure with hypoxia ([name])   Lobar pneumonia ([name])   Acute on chronic hypoxemic respiratory failure as well as septic shock present on admission secondary to left lower lobe pneumonia -Continue on cefepime with no growth on blood cultures noted -Appreciate pulmonology evaluation -Noted effusion with infiltrate on chest x-ray redemonstrated on 2/12, plan to obtain repeat chest x-ray by 2/14 or sooner if needed -Flutter valve, incentive spirometry, up in chair -Continue hydrocortisone and plan to wean off by 2/14 or sooner as BP elevates  AKI-resolved -Continue to monitor labs  Type 2 diabetes -Continue current regimen with SSI and monitor -No significant hyperglycemia noted -Holding home oral medications for the time being  Hypertension -Currently with softer blood pressure readings, hold  diltiazem and tamsulosin -Monitor on telemetry  Depression/anxiety -Plan to resume Xanax and Zoloft  History of COPD -No sign of acute exacerbation currently noted -Plan to follow-up with pulmonology outpatient  NASH cirrhosis  DVT prophylaxis: Heparin Code Status: Full Family Communication: Wife at bedside Disposition Plan:  Status is: Inpatient  Remains inpatient appropriate because:IV treatments appropriate due to intensity of illness or inability to take PO and Inpatient level of care appropriate due to severity of illness   Dispo: The patient is from: Home  Anticipated d/c is to: Home  Anticipated d/c date is: 2-3 days  Patient currently is not medically stable to d/c.              Difficult to place patient No   Consultants:   Pulmonology  Procedures:   As below  Antimicrobials:  Anti-infectives (From admission, onward)   Start     Dose/Rate Route Frequency Ordered Stop   08/19/20 1700  vancomycin (VANCOREADY) IVPB 1250 mg/250 mL  Status:  Discontinued        1,250 mg 166.7 mL/hr over 90 Minutes Intravenous Every 24 hours 08/18/20 1542 08/19/20 0800   08/19/20 0000  ceFEPIme (MAXIPIME) 2 g in sodium chloride 0.9 % 100 mL IVPB        2 g 200 mL/hr over 30 Minutes Intravenous Every 8 hours 08/18/20 1543     08/18/20 1700  vancomycin (VANCOREADY) IVPB 1750 mg/350 mL        1,750 mg 175 mL/hr over 120 Minutes Intravenous  Once 08/18/20 1538 08/18/20 1924   08/18/20 1600  ceFEPIme (MAXIPIME) 2 g in sodium chloride 0.9 %  100 mL IVPB  Status:  Discontinued        2 g 200 mL/hr over 30 Minutes Intravenous Every 8 hours 08/18/20 1542 08/18/20 1543   08/18/20 1545  ceFEPIme (MAXIPIME) 2 g in sodium chloride 0.9 % 100 mL IVPB        2 g 200 mL/hr over 30 Minutes Intravenous  Once 08/18/20 1535 08/18/20 1642   08/18/20 1545  metroNIDAZOLE (FLAGYL) IVPB 500 mg        500 mg 100 mL/hr over 60 Minutes Intravenous  Once  08/18/20 1535 08/18/20 1721   08/18/20 1545  vancomycin (VANCOCIN) IVPB 1000 mg/200 mL premix  Status:  Discontinued        1,000 mg 200 mL/hr over 60 Minutes Intravenous  Once 08/18/20 1535 08/18/20 1537       Subjective: Patient seen and evaluated today with no new acute complaints or concerns. No acute concerns or events noted overnight.  Objective: Vitals:   08/21/20 1000 08/21/20 1100 08/21/20 1135 08/21/20 1200  BP: 102/62 (!) 103/55  (!) 86/56  Pulse: 95 93 91 95  Resp: (!) 32 (!) 26 (!) 29 17  Temp:   97.9 F (36.6 C)   TempSrc:   Oral   SpO2: 91% 92% 94% 92%  Weight:      Height:        Intake/Output Summary (Last 24 hours) at 08/21/2020 1305 Last data filed at 08/21/2020 0631 Gross per 24 hour  Intake 300.07 ml  Output 2850 ml  Net -2549.93 ml   Filed Weights   08/18/20 1543 08/19/20 0330  Weight: 75.2 kg 76.8 kg    Examination:  General exam: Appears calm and comfortable  Respiratory system: Clear to auscultation. Respiratory effort normal.  Currently on 9 L nasal cannula oxygen. Cardiovascular system: S1 & S2 heard, RRR.  Gastrointestinal system: Abdomen is soft Central nervous system: Alert and awake Extremities: No edema Skin: No significant lesions noted Psychiatry: Flat affect.    Data Reviewed: I have personally reviewed following labs and imaging studies  CBC: Recent Labs  Lab 08/18/20 1548 08/19/20 0451 08/20/20 0533 08/21/20 0311  WBC 5.8 13.8* 7.4 10.3  NEUTROABS 3.0  --   --   --   HGB 16.4 14.5 13.0 13.1  HCT 52.5* 47.6 41.4 42.4  MCV 83.5 84.7 84.0 83.1  PLT 108* 113* 82* 99*   Basic Metabolic Panel: Recent Labs  Lab 08/18/20 1548 08/19/20 0451 08/20/20 0533 08/21/20 0311  NA 134* 140 137 138  K 3.6 4.5 3.8 3.3*  CL 102 107 102 102  CO2 21* _0 GLUCOSE 178* 192* 154* 141*  BUN _1 CREATININE 1.52* 0.91 0.72 0.59*  CALCIUM 8.5* 8.5* 8.6* 8.7*  MG  --   --  2.0 2.0   GFR: Estimated Creatinine  Clearance: 87.2 mL/min (A) (by C-G formula based on SCr of 0.59 mg/dL (L)). Liver Function Tests: Recent Labs  Lab 08/18/20 1548 08/19/20 0451 08/20/20 0533  AST 20 32 21  ALT _2 ALKPHOS 74 40 45  BILITOT 2.7* 1.6* 1.4*  PROT 7.5 6.7 6.7  ALBUMIN 3.6 3.0* 2.8*   No results for input(s): LIPASE, AMYLASE in the last 168 hours. No results for input(s): AMMONIA in the last 168 hours. Coagulation Profile: Recent Labs  Lab 08/18/20 1548  INR 1.2   Cardiac Enzymes: No results for input(s): CKTOTAL, CKMB, CKMBINDEX, TROPONINI in the last 168 hours. BNP (last 3  results) No results for input(s): PROBNP in the last 8760 hours. HbA1C: Recent Labs    08/18/20 1717  HGBA1C 7.3*   CBG: Recent Labs  Lab 08/20/20 1641 08/20/20 2047 08/21/20 0313 08/21/20 0814 08/21/20 1134  GLUCAP 132* 139* 137* 108* 197*   Lipid Profile: No results for input(s): CHOL, HDL, LDLCALC, TRIG, CHOLHDL, LDLDIRECT in the last 72 hours. Thyroid Function Tests: No results for input(s): TSH, T4TOTAL, FREET4, T3FREE, THYROIDAB in the last 72 hours. Anemia Panel: No results for input(s): VITAMINB12, FOLATE, FERRITIN, TIBC, IRON, RETICCTPCT in the last 72 hours. Sepsis Labs: Recent Labs  Lab 08/18/20 1548 08/18/20 1717 08/20/20 0533 08/21/20 0311  PROCALCITON  --   --  57.96 33.76  LATICACIDVEN 4.0* 1.9  --   --     Recent Results (from the past 240 hour(s))  Resp Panel by RT-PCR (Flu A&B, Covid) Nasopharyngeal Swab     Status: None   Collection Time: 08/18/20  3:35 PM   Specimen: Nasopharyngeal Swab; Nasopharyngeal(NP) swabs in vial transport medium  Result Value Ref Range Status   SARS Coronavirus 2 by RT PCR NEGATIVE NEGATIVE Final    Comment: (NOTE) SARS-CoV-2 target nucleic acids are NOT DETECTED.  The SARS-CoV-2 RNA is generally detectable in upper respiratory specimens during the acute phase of infection. The lowest concentration of SARS-CoV-2 viral copies this assay can detect  is 138 copies/mL. A negative result does not preclude SARS-Cov-2 infection and should not be used as the sole basis for treatment or other patient management decisions. A negative result may occur with  improper specimen collection/handling, submission of specimen other than nasopharyngeal swab, presence of viral mutation(s) within the areas targeted by this assay, and inadequate number of viral copies(<138 copies/mL). A negative result must be combined with clinical observations, patient history, and epidemiological information. The expected result is Negative.  Fact Sheet for Patients:  EntrepreneurPulse.com.au  Fact Sheet for Healthcare Providers:  IncredibleEmployment.be  This test is no t yet approved or cleared by the [place] FDA and  has been authorized for detection and/or diagnosis of SARS-CoV-2 by FDA under an Emergency Use Authorization (EUA). This EUA will remain  in effect (meaning this test can be used) for the duration of the COVID-19 declaration under Section 564(b)(1) of the Act, 21 U.S.C.section 360bbb-3(b)(1), unless the authorization is terminated  or revoked sooner.       Influenza A by PCR NEGATIVE NEGATIVE Final   Influenza B by PCR NEGATIVE NEGATIVE Final    Comment: (NOTE) The Xpert Xpress SARS-CoV-2/FLU/RSV plus assay is intended as an aid in the diagnosis of influenza from Nasopharyngeal swab specimens and should not be used as a sole basis for treatment. Nasal washings and aspirates are unacceptable for Xpert Xpress SARS-CoV-2/FLU/RSV testing.  Fact Sheet for Patients: EntrepreneurPulse.com.au  Fact Sheet for Healthcare Providers: IncredibleEmployment.be  This test is not yet approved or cleared by the [place] FDA and has been authorized for detection and/or diagnosis of SARS-CoV-2 by FDA under an Emergency Use Authorization (EUA). This EUA will remain in effect  (meaning this test can be used) for the duration of the COVID-19 declaration under Section 564(b)(1) of the Act, 21 U.S.C. section 360bbb-3(b)(1), unless the authorization is terminated or revoked.  Performed at [facility], [address] [place], [place] [address]   MRSA PCR Screening     Status: None   Collection Time: 08/18/20  3:35 PM   Specimen: Nasopharyngeal  Result Value Ref Range Status   MRSA by  PCR NEGATIVE NEGATIVE Final    Comment:        The GeneXpert MRSA Assay (FDA approved for NASAL specimens only), is one component of a comprehensive MRSA colonization surveillance program. It is not intended to diagnose MRSA infection nor to guide or monitor treatment for MRSA infections. Performed at [facility], [address] [place], [place] [address]   Blood Culture (routine x 2)     Status: None (Preliminary result)   Collection Time: 08/18/20  3:48 PM   Specimen: BLOOD LEFT FOREARM  Result Value Ref Range Status   Specimen Description BLOOD LEFT FOREARM  Final   Special Requests   Final    BOTTLES DRAWN AEROBIC AND ANAEROBIC Blood Culture adequate volume   Culture   Final    NO GROWTH 2 DAYS Performed at [facility], [address], [place], [place] [address]    Report Status PENDING  Incomplete  Blood Culture (routine x 2)     Status: None (Preliminary result)   Collection Time: 08/18/20  3:48 PM   Specimen: BLOOD RIGHT WRIST  Result Value Ref Range Status   Specimen Description BLOOD RIGHT WRIST  Final   Special Requests   Final    BOTTLES DRAWN AEROBIC AND ANAEROBIC Blood Culture adequate volume   Culture   Final    NO GROWTH 2 DAYS Performed at [facility], [address] [place],  [address]    Report Status PENDING  Incomplete         Radiology Studies: DG Chest Port 1 View  Result Date: 08/20/2020 CLINICAL DATA:  65 year old male with shortness of breath, weakness. Evidence of left lung pneumonia and effusion on recent portable chest.  EXAM: PORTABLE CHEST 1 VIEW COMPARISON:  Portable chest 08/18/2020 and earlier. FINDINGS: Portable AP upright view at 0613 hours. Underlying large lung volumes. Stable cardiac size and mediastinal contours. Coarse, chronic pulmonary interstitial opacity in both lungs appears stable since last year with large calcified right upper lobe granuloma. Superimposed confluent airspace disease in the left lower lung with probable pleural effusion. The degree of left lung opacity has mildly progressed since yesterday. Only the apex appears completely spared now. Paucity of bowel gas. Stable visualized osseous structures. IMPRESSION: Chronic lung disease with superimposed confluent left lower lung pneumonia, associated pleural effusion which have mildly progressed from yesterday. Electronically Signed   By: Genevie Ann M.D.   On: 08/20/2020 07:49        Scheduled Meds: . aspirin EC  81 mg Oral Q breakfast  . atorvastatin  40 mg Oral Daily  . Chlorhexidine Gluconate Cloth  6 each Topical Daily  . gabapentin  300 mg Oral QHS  . heparin  5,000 Units Subcutaneous Q8H  . hydrocortisone sodium succinate  50 mg Intravenous Q12H  . insulin aspart  0-9 Units Subcutaneous Q4H  . melatonin  6 mg Oral QHS  . pantoprazole  40 mg Oral BID AC  . potassium chloride  40 mEq Oral Once   Continuous Infusions: . ceFEPime (MAXIPIME) IV 2 g (08/21/20 0729)     LOS: 3 days    Time spent: 35 minutes    Khyrin Trevathan D Manuella Ghazi, DO [facility]  If 7PM-7AM, please contact night-coverage [email or web address] 08/21/2020, 1:05 PM

## 2020-08-21 NOTE — Progress Notes (Signed)
Pharmacy Antibiotic Note  [name] is a 65 y.o. male admitted on 08/18/2020 with sepsis.  Pharmacy has been consulted for vancomycin and cefepime  dosing.  Plan: Continue cefepime 2000 mg IV every 8 hours. Monitor labs, c/s, and patient improvement.   Height: 5' 7"  (170.2 cm) Weight: 76.8 kg (169 lb 5 oz) IBW/kg (Calculated) : 66.1  Temp (24hrs), Avg:97.9 F (36.6 C), Min:97.9 F (36.6 C), Max:97.9 F (36.6 C)  Recent Labs  Lab 08/18/20 1548 08/18/20 1717 08/19/20 0451 08/20/20 0533 08/21/20 0311  WBC 5.8  --  13.8* 7.4 10.3  CREATININE 1.52*  --  0.91 0.72 0.59*  LATICACIDVEN 4.0* 1.9  --   --   --     Estimated Creatinine Clearance: 87.2 mL/min (A) (by C-G formula based on SCr of 0.59 mg/dL (L)).    Allergies  Allergen Reactions  . Baclofen     lethargic   . Simvastatin Nausea Only    Antimicrobials this admission: cefepime 2/10>>  vancomycin 2/10>> 2/11 metronidazole 2/10 >>x1 dose in ED   Microbiology results: 2/10 BCx2: ngtd 2/10 UCx:  pending 2/10 sputum Cx: pending 2/10 MRSA PCR:  negative  Thank you for allowing pharmacy to be a part of this patient's care.  Ramond Craver 08/21/2020 8:28 AM

## 2020-08-22 ENCOUNTER — Inpatient Hospital Stay (HOSPITAL_COMMUNITY): Payer: HMO

## 2020-08-22 ENCOUNTER — Telehealth: Payer: Self-pay | Admitting: Internal Medicine

## 2020-08-22 DIAGNOSIS — A419 Sepsis, unspecified organism: Secondary | ICD-10-CM | POA: Diagnosis not present

## 2020-08-22 DIAGNOSIS — R6521 Severe sepsis with septic shock: Secondary | ICD-10-CM | POA: Diagnosis not present

## 2020-08-22 DIAGNOSIS — J189 Pneumonia, unspecified organism: Secondary | ICD-10-CM

## 2020-08-22 DIAGNOSIS — J9621 Acute and chronic respiratory failure with hypoxia: Secondary | ICD-10-CM | POA: Diagnosis not present

## 2020-08-22 LAB — CBC
HCT: 44.8 % (ref 39.0–52.0)
Hemoglobin: 13.9 g/dL (ref 13.0–17.0)
MCH: 25.9 pg — ABNORMAL LOW (ref 26.0–34.0)
MCHC: 31 g/dL (ref 30.0–36.0)
MCV: 83.6 fL (ref 80.0–100.0)
Platelets: 111 10*3/uL — ABNORMAL LOW (ref 150–400)
RBC: 5.36 MIL/uL (ref 4.22–5.81)
RDW: 16.3 % — ABNORMAL HIGH (ref 11.5–15.5)
WBC: 8.1 10*3/uL (ref 4.0–10.5)
nRBC: 0 % (ref 0.0–0.2)

## 2020-08-22 LAB — GLUCOSE, CAPILLARY
Glucose-Capillary: 131 mg/dL — ABNORMAL HIGH (ref 70–99)
Glucose-Capillary: 98 mg/dL (ref 70–99)
Glucose-Capillary: 207 mg/dL — ABNORMAL HIGH (ref 70–99)
Glucose-Capillary: 118 mg/dL — ABNORMAL HIGH (ref 70–99)
Glucose-Capillary: 135 mg/dL — ABNORMAL HIGH (ref 70–99)

## 2020-08-22 LAB — BASIC METABOLIC PANEL
Anion gap: 9 (ref 5–15)
BUN: 17 mg/dL (ref 8–23)
CO2: 27 mmol/L (ref 22–32)
Calcium: 8.6 mg/dL — ABNORMAL LOW (ref 8.9–10.3)
Chloride: 104 mmol/L (ref 98–111)
Creatinine, Ser: 0.55 mg/dL — ABNORMAL LOW (ref 0.61–1.24)
GFR, Estimated: >60 mL/min (ref >60)
Glucose, Bld: 134 mg/dL — ABNORMAL HIGH (ref 70–99)
Potassium: 3.4 mmol/L — ABNORMAL LOW (ref 3.5–5.1)
Sodium: 140 mmol/L (ref 135–145)

## 2020-08-22 LAB — PROCALCITONIN: Procalcitonin: 22.48 ng/mL

## 2020-08-22 LAB — MAGNESIUM: Magnesium: 2.1 mg/dL (ref 1.7–2.4)

## 2020-08-22 MED ORDER — GUAIFENESIN ER 600 MG PO TB12
600.0000 mg | ORAL_TABLET | Freq: Two times a day (BID) | ORAL | Status: DC
  Administered 2020-08-22 – 2020-08-23 (×3): 600 mg via ORAL
  Filled 2020-08-22 (×3): qty 1

## 2020-08-22 MED ORDER — SALINE SPRAY 0.65 % NA SOLN
1.0000 | NASAL | Status: DC | PRN
  Administered 2020-08-22: 1 via NASAL
  Filled 2020-08-22: qty 44

## 2020-08-22 MED ORDER — FLUTICASONE PROPIONATE 50 MCG/ACT NA SUSP
1.0000 | Freq: Every day | NASAL | Status: DC
  Administered 2020-08-22 – 2020-08-23 (×2): 1 via NASAL
  Filled 2020-08-22: qty 16

## 2020-08-22 MED ORDER — POTASSIUM CHLORIDE CRYS ER 20 MEQ PO TBCR
40.0000 meq | EXTENDED_RELEASE_TABLET | Freq: Once | ORAL | Status: AC
  Administered 2020-08-22: 40 meq via ORAL
  Filled 2020-08-22: qty 2

## 2020-08-22 NOTE — Progress Notes (Addendum)
PROGRESS NOTE    [name]  DGL:[id number] DOB: [birth year] DOA: 08/18/2020 PCP: Asencion Noble, MD   Brief Narrative:  [name] is a 65 yo male with a history of COPD (requiring 3L O2 at home), recurrent pneumonia, and history of mycobacterium avium complex who presented to [facility] on 2/10 with increasing SOB, cough, fever, and vomiting. Found to be hypoxic by EMS.Admitted for septic shock.Started on Solu-Medrol, IVF, vancomycin, cefepime. InICU, currently requiring Levophed and 13L HFNC.  -His oxygen requirements are coming down and he is currently on 7 L nasal cannula oxygen.  Hydrocortisone to be discontinued today as blood pressures are more stable.  Continue ongoing treatment.  Assessment & Plan:   Principal Problem:   Septic shock ([place]) Active Problems:   HTN (hypertension)   Type 2 diabetes mellitus without complication, without long-term current use of insulin ([name])   Liver fibrosis   Mycobacterium avium infection ([place])   COPD GOLD III   Chronic respiratory failure with hypoxia ([name])   Acute on chronic respiratory failure with hypoxia ([name])   Lobar pneumonia ([name])  Acute on chronic hypoxemic respiratory failure as well as septic shock present on admission secondary to left lower lobe pneumonia -Continue on cefepime with no growth on blood cultures noted -Appreciate ongoing pulmonology evaluation -Noted effusion on CXR 2/14, may need to consider chest CT? -Flutter valve, incentive spirometry, up in chair -DC hydrocortisone 2/14  Mild hypokalemia -Replete and re-eval in am  AKI-resolved -Continue to monitor labs  Type 2 diabetes -Continue current regimen with SSI and monitor -No significant hyperglycemia noted -Holding home oral medications for the time being  Hypertension -Currently with softer blood pressure readings, hold diltiazem and tamsulosin -Monitor on telemetry -Stable enough to DC hydrocortisone 2/14  Depression/anxiety -Plan to continue  Xanax and Zoloft  History of COPD -No sign of acute exacerbation currently noted -Plan to follow-up with pulmonology outpatient  NASHcirrhosis  DVT prophylaxis:Heparin Code Status:Full Family Communication:Wife at bedside updated 2/14 Disposition Plan: Status is: Inpatient  Remains inpatient appropriate because:IV treatments appropriate due to intensity of illness or inability to take PO and Inpatient level of care appropriate due to severity of illness   Dispo: The patient is from:Home Anticipated d/c is JJ:OACZ Anticipated d/c date is: 2-3 days Patient currently is not medically stable to d/c. Difficult to place patient No   Consultants:  Pulmonology  Procedures:  As below  Antimicrobials:  Anti-infectives (From admission, onward)   Start     Dose/Rate Route Frequency Ordered Stop   08/19/20 1700  vancomycin (VANCOREADY) IVPB 1250 mg/250 mL  Status:  Discontinued        1,250 mg 166.7 mL/hr over 90 Minutes Intravenous Every 24 hours 08/18/20 1542 08/19/20 0800   08/19/20 0000  ceFEPIme (MAXIPIME) 2 g in sodium chloride 0.9 % 100 mL IVPB        2 g 200 mL/hr over 30 Minutes Intravenous Every 8 hours 08/18/20 1543     08/18/20 1700  vancomycin (VANCOREADY) IVPB 1750 mg/350 mL        1,750 mg 175 mL/hr over 120 Minutes Intravenous  Once 08/18/20 1538 08/18/20 1924   08/18/20 1600  ceFEPIme (MAXIPIME) 2 g in sodium chloride 0.9 % 100 mL IVPB  Status:  Discontinued        2 g 200 mL/hr over 30 Minutes Intravenous Every 8 hours 08/18/20 1542 08/18/20 1543   08/18/20 1545  ceFEPIme (MAXIPIME) 2 g in sodium chloride 0.9 % 100  mL IVPB        2 g 200 mL/hr over 30 Minutes Intravenous  Once 08/18/20 1535 08/18/20 1642   08/18/20 1545  metroNIDAZOLE (FLAGYL) IVPB 500 mg        500 mg 100 mL/hr over 60 Minutes Intravenous  Once 08/18/20 1535 08/18/20 1721   08/18/20 1545  vancomycin (VANCOCIN) IVPB 1000  mg/200 mL premix  Status:  Discontinued        1,000 mg 200 mL/hr over 60 Minutes Intravenous  Once 08/18/20 1535 08/18/20 1537       Subjective: Patient seen and evaluated today with no new acute complaints or concerns. No acute concerns or events noted overnight.  Objective: Vitals:   08/22/20 0700 08/22/20 0743 08/22/20 0800 08/22/20 0832  BP:   118/75 118/75  Pulse: (!) 104  94 97  Resp: (!) 33  (!) 24 (!) 23  Temp:  (!) 97.3 F (36.3 C)    TempSrc:  Axillary    SpO2: 91%  96% 97%  Weight:      Height:        Intake/Output Summary (Last 24 hours) at 08/22/2020 1132 Last data filed at 08/22/2020 0022 Gross per 24 hour  Intake 300 ml  Output 1650 ml  Net -1350 ml   Filed Weights   08/18/20 1543 08/19/20 0330  Weight: 75.2 kg 76.8 kg    Examination:  General exam: Appears calm and comfortable  Respiratory system: Clear to auscultation. Respiratory effort normal. Currently on 7L [place]. Cardiovascular system: S1 & S2 heard, RRR.  Gastrointestinal system: Abdomen is soft Central nervous system: Alert and awake Extremities: No edema Skin: No significant lesions noted Psychiatry: Flat affect.    Data Reviewed: I have personally reviewed following labs and imaging studies  CBC: Recent Labs  Lab 08/18/20 1548 08/19/20 0451 08/20/20 0533 08/21/20 0311 08/22/20 0402  WBC 5.8 13.8* 7.4 10.3 8.1  NEUTROABS 3.0  --   --   --   --   HGB 16.4 14.5 13.0 13.1 13.9  HCT 52.5* 47.6 41.4 42.4 44.8  MCV 83.5 84.7 84.0 83.1 83.6  PLT 108* 113* 82* 99* 569*   Basic Metabolic Panel: Recent Labs  Lab 08/18/20 1548 08/19/20 0451 08/20/20 0533 08/21/20 0311 08/22/20 0402  NA 134* 140 137 138 140  K 3.6 4.5 3.8 3.3* 3.4*  CL 102 107 102 102 104  CO2 21* _0 GLUCOSE 178* 192* 154* 141* 134*  BUN _1 CREATININE 1.52* 0.91 0.72 0.59* 0.55*  CALCIUM 8.5* 8.5* 8.6* 8.7* 8.6*  MG  --   --  2.0 2.0 2.1   GFR: Estimated Creatinine Clearance: 87.2  mL/min (A) (by C-G formula based on SCr of 0.55 mg/dL (L)). Liver Function Tests: Recent Labs  Lab 08/18/20 1548 08/19/20 0451 08/20/20 0533  AST 20 32 21  ALT _2 ALKPHOS 74 40 45  BILITOT 2.7* 1.6* 1.4*  PROT 7.5 6.7 6.7  ALBUMIN 3.6 3.0* 2.8*   No results for input(s): LIPASE, AMYLASE in the last 168 hours. No results for input(s): AMMONIA in the last 168 hours. Coagulation Profile: Recent Labs  Lab 08/18/20 1548  INR 1.2   Cardiac Enzymes: No results for input(s): CKTOTAL, CKMB, CKMBINDEX, TROPONINI in the last 168 hours. BNP (last 3 results) No results for input(s): PROBNP in the last 8760 hours. HbA1C: No results for input(s): HGBA1C in the last 72 hours. CBG: Recent Labs  Lab  08/21/20 1620 08/21/20 2007 08/21/20 2346 08/22/20 0406 08/22/20 0744  GLUCAP 154* 151* 172* 131* 98   Lipid Profile: No results for input(s): CHOL, HDL, LDLCALC, TRIG, CHOLHDL, LDLDIRECT in the last 72 hours. Thyroid Function Tests: No results for input(s): TSH, T4TOTAL, FREET4, T3FREE, THYROIDAB in the last 72 hours. Anemia Panel: No results for input(s): VITAMINB12, FOLATE, FERRITIN, TIBC, IRON, RETICCTPCT in the last 72 hours. Sepsis Labs: Recent Labs  Lab 08/18/20 1548 08/18/20 1717 08/20/20 0533 08/21/20 0311 08/22/20 0402  PROCALCITON  --   --  57.96 33.76 22.48  LATICACIDVEN 4.0* 1.9  --   --   --     Recent Results (from the past 240 hour(s))  Resp Panel by RT-PCR (Flu A&B, Covid) Nasopharyngeal Swab     Status: None   Collection Time: 08/18/20  3:35 PM   Specimen: Nasopharyngeal Swab; Nasopharyngeal(NP) swabs in vial transport medium  Result Value Ref Range Status   SARS Coronavirus 2 by RT PCR NEGATIVE NEGATIVE Final    Comment: (NOTE) SARS-CoV-2 target nucleic acids are NOT DETECTED.  The SARS-CoV-2 RNA is generally detectable in upper respiratory specimens during the acute phase of infection. The lowest concentration of SARS-CoV-2 viral copies this  assay can detect is 138 copies/mL. A negative result does not preclude SARS-Cov-2 infection and should not be used as the sole basis for treatment or other patient management decisions. A negative result may occur with  improper specimen collection/handling, submission of specimen other than nasopharyngeal swab, presence of viral mutation(s) within the areas targeted by this assay, and inadequate number of viral copies(<138 copies/mL). A negative result must be combined with clinical observations, patient history, and epidemiological information. The expected result is Negative.  Fact Sheet for Patients:  EntrepreneurPulse.com.au  Fact Sheet for Healthcare Providers:  IncredibleEmployment.be  This test is no t yet approved or cleared by the [place] FDA and  has been authorized for detection and/or diagnosis of SARS-CoV-2 by FDA under an Emergency Use Authorization (EUA). This EUA will remain  in effect (meaning this test can be used) for the duration of the COVID-19 declaration under Section 564(b)(1) of the Act, 21 U.S.C.section 360bbb-3(b)(1), unless the authorization is terminated  or revoked sooner.       Influenza A by PCR NEGATIVE NEGATIVE Final   Influenza B by PCR NEGATIVE NEGATIVE Final    Comment: (NOTE) The Xpert Xpress SARS-CoV-2/FLU/RSV plus assay is intended as an aid in the diagnosis of influenza from Nasopharyngeal swab specimens and should not be used as a sole basis for treatment. Nasal washings and aspirates are unacceptable for Xpert Xpress SARS-CoV-2/FLU/RSV testing.  Fact Sheet for Patients: EntrepreneurPulse.com.au  Fact Sheet for Healthcare Providers: IncredibleEmployment.be  This test is not yet approved or cleared by the [place] FDA and has been authorized for detection and/or diagnosis of SARS-CoV-2 by FDA under an Emergency Use Authorization (EUA). This EUA will  remain in effect (meaning this test can be used) for the duration of the COVID-19 declaration under Section 564(b)(1) of the Act, 21 U.S.C. section 360bbb-3(b)(1), unless the authorization is terminated or revoked.  Performed at [facility], [address], [place], [place] [address]   MRSA PCR Screening     Status: None   Collection Time: 08/18/20  3:35 PM   Specimen: Nasopharyngeal  Result Value Ref Range Status   MRSA by PCR NEGATIVE NEGATIVE Final    Comment:        The GeneXpert MRSA Assay (FDA approved for NASAL  specimens only), is one component of a comprehensive MRSA colonization surveillance program. It is not intended to diagnose MRSA infection nor to guide or monitor treatment for MRSA infections. Performed at [facility] [address], [place], [place] [address]   Blood Culture (routine x 2)     Status: None (Preliminary result)   Collection Time: 08/18/20  3:48 PM   Specimen: BLOOD LEFT FOREARM  Result Value Ref Range Status   Specimen Description BLOOD LEFT FOREARM  Final   Special Requests   Final    BOTTLES DRAWN AEROBIC AND ANAEROBIC Blood Culture adequate volume   Culture   Final    NO GROWTH 4 DAYS Performed at [facility], [address] [place], [place] [address]    Report Status PENDING  Incomplete  Blood Culture (routine x 2)     Status: None (Preliminary result)   Collection Time: 08/18/20  3:48 PM   Specimen: BLOOD RIGHT WRIST  Result Value Ref Range Status   Specimen Description BLOOD RIGHT WRIST  Final   Special Requests   Final    BOTTLES DRAWN AEROBIC AND ANAEROBIC Blood Culture adequate volume   Culture   Final    NO GROWTH 4 DAYS Performed at [facility], [address] [place], [name] [address]    Report Status PENDING  Incomplete         Radiology Studies: DG Chest 1 View  Result Date: 08/22/2020 CLINICAL DATA:  Follow-up pneumonia Shortness of breath EXAM: CHEST  1 VIEW COMPARISON:  08/20/2020 FINDINGS: Heart size within  normal limits. Mild unchanged pulmonary vascular congestion. New left mid and basilar airspace opacity appears slightly worse compared to prior exam. Multiple healed right rib fractures again seen. IMPRESSION: Mild interval worsening of left lung airspace disease. Moderate left pleural effusion likely present. Electronically Signed   By: Miachel Roux M.D.   On: 08/22/2020 07:38   [place] CHEST (PLEURAL EFFUSION)  Result Date: 08/22/2020 CLINICAL DATA:  Abnormal chest radiograph, LEFT pleural effusion EXAM: CHEST ULTRASOUND COMPARISON:  Chest radiograph 08/22/2020 FINDINGS: No LEFT pleural effusion identified. IMPRESSION: No LEFT pleural effusion identified. Findings on chest radiograph are therefore likely due to a combination of atelectasis and consolidation in the LEFT lower lobe. Electronically Signed   By: Lavonia Dana M.D.   On: 08/22/2020 11:05        Scheduled Meds: . aspirin EC  81 mg Oral Q breakfast  . atorvastatin  40 mg Oral Daily  . Chlorhexidine Gluconate Cloth  6 each Topical Daily  . fluticasone  1 spray Each Nare Daily  . gabapentin  300 mg Oral QHS  . guaiFENesin  600 mg Oral BID  . heparin  5,000 Units Subcutaneous Q8H  . insulin aspart  0-9 Units Subcutaneous Q4H  . melatonin  6 mg Oral QHS  . pantoprazole  40 mg Oral BID AC  . potassium chloride  40 mEq Oral Once   Continuous Infusions: . ceFEPime (MAXIPIME) IV 2 g (08/22/20 0908)     LOS: 4 days    Time spent: 35 minutes    Alvino Lechuga D Manuella Ghazi, DO [facility]  If 7PM-7AM, please contact night-coverage [email or web address] 08/22/2020, 11:32 AM

## 2020-08-22 NOTE — Progress Notes (Signed)
NAME:  [name], MRN:  [id number], DOB:  [birth year], LOS: 4 ADMISSION DATE:  08/18/2020, CONSULTATION DATE:  2/11 REFERRING MD:  Zorita Pang  CHIEF COMPLAINT: sob/vomiting  Brief History:  53M with COPD GOLD III but 02 dep/ quit smoking 2012 MM phenotype  and prior pulmonary MAC isolation in 2019 who presented to [facility] ED pm 2/10  with dyspnea, cough and fever -- subsequently found to have LLL pneumonia on CXR. He was hypotensive and hypoxemic on arrival requiring 2.5L IVF resuscitation, norepinephrine infusion, and HFNC and rx per [facility] ICU on admit and formal PCCM consult.  Past Medical History:  Anxiety Chest pain COPD Depression Diabetes mellitus  Dyslipidemia Dysrhythmia Fracture (12/15)  GERD  Hypertension  NASH (nonalcoholic steatohepatitis) (2018) PONV (postoperative nausea and vomiting)  Splenomegaly.    Significant Hospital Events:     Consults:  PCCM 2/10   Procedures:    Significant Diagnostic Tests:  EGD 07/2016 portal gastropathy, normal esophagus alpha one AT screen 01/31/17 MM Level 169 - PFT's 06/18/2019 FEV1 1.12 (35 % ) ratio 0.46 p 0 % improvement from saba p ? prior to study with DLCO 11.91 (48%) corrects to 3.18 (754%) for alv volume  ST eval  08/05/20  SLP Diet Recommendations Regular solids;Thin liquid  Liquid Administration via Straw;Cup  Medication Administration Whole meds with liquid  Compensations Slow rate;Small sips/bites  Postural Changes Seated upright at 90 degrees;Remain semi-upright after after feeds/meals (Comment)  Fasting Cortisol  Am 2/11 >>>8.7   Micro Data:  MRSA  PCR   2/10  Neg  BC   X   2        2/10  >>>ng Urine strep      2/11  >>> neg  Antimicrobials:  Cefempime  2/10 Flagyl  2/10 Vanc   2/10   Scheduled Meds: . aspirin EC  81 mg Oral Q breakfast  . atorvastatin  40 mg Oral Daily  . Chlorhexidine Gluconate Cloth  6 each Topical Daily  . fluticasone  1 spray Each Nare Daily  . gabapentin  300 mg  Oral QHS  . guaiFENesin  600 mg Oral BID  . heparin  5,000 Units Subcutaneous Q8H  . insulin aspart  0-9 Units Subcutaneous Q4H  . melatonin  6 mg Oral QHS  . pantoprazole  40 mg Oral BID AC   Continuous Infusions: . ceFEPime (MAXIPIME) IV 2 g (08/22/20 0908)   PRN Meds:.acetaminophen **OR** acetaminophen, ALPRAZolam, guaiFENesin-dextromethorphan, ipratropium-albuterol, ondansetron **OR** ondansetron (ZOFRAN) IV, sodium chloride    Interim History / Subjective:   Denies chest pain, dyspnea at baseline Cough with clear sputum Afebrile On 7 L nasal cannula  Objective   Blood pressure 118/75, pulse 97, temperature (!) 97.3 F (36.3 C), temperature source Axillary, resp. rate (!) 23, height _0  (1.702 m), weight 76.8 kg, SpO2 97 %.        Intake/Output Summary (Last 24 hours) at 08/22/2020 1414 Last data filed at 08/22/2020 0022 Gross per 24 hour  Intake 300 ml  Output 1650 ml  Net -1350 ml   Filed Weights   08/18/20 1543 08/19/20 0330  Weight: 75.2 kg 76.8 kg    Examination:  General: acute and chronically ill wm, no distress HENT: No thrush, no JVD Lungs: Left basal crackles Cardiovascular: RRR NSR on monitor  Abdomen: mod distended, non tender Extremities: warm s cyanosis or clubbing or edema Neuro: alert/oriented x 3     I personally reviewed images and agree with radiology impression as  follows:  CXR:   2/14 Left basal infiltrate, obliteration of costophrenic angle   Labs show mild hypokalemia, decreasing leukocytosis and procalcitonin  Resolved Hospital Problem list     Septic shock   Assessment & Plan:    1) Acute on chronic hypoxemic Resp failure in pt with severe copd and probable recurrent aspiration pneumonia (h/o vomiting before decompensation) -Ultrasound chest obtained which does not show significant effusion  -Responding clinically -Complete antibiotics with oral course for total 10 days -I was able to dial down oxygen to 5 L, maintain  saturation 90% and above, on 3 L at home   2) Severe copd / gold III/ MM phenotype  - no change chronic rx/ no need for steroids here but will check cortisol to be sure he isn't developing relative adrenal insuff due to past steroid rx   3)  recurrent vomiting in diabetic on CCB/ anticholinergics leading to asp >>> d/c CCB  in favor of bisoprolol and consider gastroparesis w/u >>> also good tme to try off spiriva same issue   4) relative adrenal insufficiency -a.m. cortisol is low Can discontinue hydrocortisone now that off pressors  PCCM will be available as needed  Labs   CBC: Recent Labs  Lab 08/18/20 1548 08/19/20 0451 08/20/20 0533 08/21/20 0311 08/22/20 0402  WBC 5.8 13.8* 7.4 10.3 8.1  NEUTROABS 3.0  --   --   --   --   HGB 16.4 14.5 13.0 13.1 13.9  HCT 52.5* 47.6 41.4 42.4 44.8  MCV 83.5 84.7 84.0 83.1 83.6  PLT 108* 113* 82* 99* 111*    Basic Metabolic Panel: Recent Labs  Lab 08/18/20 1548 08/19/20 0451 08/20/20 0533 08/21/20 0311 08/22/20 0402  NA 134* 140 137 138 140  K 3.6 4.5 3.8 3.3* 3.4*  CL 102 107 102 102 104  CO2 21* _0 GLUCOSE 178* 192* 154* 141* 134*  BUN _1 CREATININE 1.52* 0.91 0.72 0.59* 0.55*  CALCIUM 8.5* 8.5* 8.6* 8.7* 8.6*  MG  --   --  2.0 2.0 2.1   GFR: Estimated Creatinine Clearance: 87.2 mL/min (A) (by C-G formula based on SCr of 0.55 mg/dL (L)). Recent Labs  Lab 08/18/20 1548 08/18/20 1717 08/19/20 0451 08/20/20 0533 08/21/20 0311 08/22/20 0402  PROCALCITON  --   --   --  57.96 33.76 22.48  WBC 5.8  --  13.8* 7.4 10.3 8.1  LATICACIDVEN 4.0* 1.9  --   --   --   --     Liver Function Tests: Recent Labs  Lab 08/18/20 1548 08/19/20 0451 08/20/20 0533  AST 20 32 21  ALT _2 ALKPHOS 74 40 45  BILITOT 2.7* 1.6* 1.4*  PROT 7.5 6.7 6.7  ALBUMIN 3.6 3.0* 2.8*   No results for input(s): LIPASE, AMYLASE in the last 168 hours. No results for input(s): AMMONIA in the last 168 hours.  ABG     Component Value Date/Time   PHART 7.292 (L) 08/18/2020 2130   PCO2ART 42.4 08/18/2020 2130   PO2ART 70.1 (L) 08/18/2020 2130   HCO3 19.4 (L) 08/18/2020 2130   TCO2 30 11/14/2012 0801   ACIDBASEDEF 5.6 (H) 08/18/2020 2130   O2SAT 91.2 08/18/2020 2130      Kara Mead MD. FCCP. [facility] Pager : [phone]  If no response to pager , please call [phone] until 7 pm After 7:00 pm call Elink  [phone]  08/22/2020   

## 2020-08-22 NOTE — Telephone Encounter (Signed)
Recvd' records from [place] [facility] forwarded 4 pages to Dr. Christinia Gully 2/14/22fbg

## 2020-08-23 ENCOUNTER — Telehealth: Payer: Self-pay

## 2020-08-23 DIAGNOSIS — R6521 Severe sepsis with septic shock: Secondary | ICD-10-CM | POA: Diagnosis not present

## 2020-08-23 DIAGNOSIS — A419 Sepsis, unspecified organism: Secondary | ICD-10-CM | POA: Diagnosis not present

## 2020-08-23 LAB — CBC
HCT: 45.4 % (ref 39.0–52.0)
Hemoglobin: 13.9 g/dL (ref 13.0–17.0)
MCH: 26 pg (ref 26.0–34.0)
MCHC: 30.6 g/dL (ref 30.0–36.0)
MCV: 84.9 fL (ref 80.0–100.0)
Platelets: 132 10*3/uL — ABNORMAL LOW (ref 150–400)
RBC: 5.35 MIL/uL (ref 4.22–5.81)
RDW: 16.6 % — ABNORMAL HIGH (ref 11.5–15.5)
WBC: 8.7 10*3/uL (ref 4.0–10.5)
nRBC: 0 % (ref 0.0–0.2)

## 2020-08-23 LAB — CULTURE, BLOOD (ROUTINE X 2)
Culture: NO GROWTH
Culture: NO GROWTH
Special Requests: ADEQUATE
Special Requests: ADEQUATE

## 2020-08-23 LAB — BASIC METABOLIC PANEL
Anion gap: 7 (ref 5–15)
BUN: 19 mg/dL (ref 8–23)
CO2: 27 mmol/L (ref 22–32)
Calcium: 8.5 mg/dL — ABNORMAL LOW (ref 8.9–10.3)
Chloride: 105 mmol/L (ref 98–111)
Creatinine, Ser: 0.78 mg/dL (ref 0.61–1.24)
GFR, Estimated: >60 mL/min (ref >60)
Glucose, Bld: 121 mg/dL — ABNORMAL HIGH (ref 70–99)
Potassium: 3.9 mmol/L (ref 3.5–5.1)
Sodium: 139 mmol/L (ref 135–145)

## 2020-08-23 LAB — GLUCOSE, CAPILLARY
Glucose-Capillary: 134 mg/dL — ABNORMAL HIGH (ref 70–99)
Glucose-Capillary: 136 mg/dL — ABNORMAL HIGH (ref 70–99)
Glucose-Capillary: 158 mg/dL — ABNORMAL HIGH (ref 70–99)
Glucose-Capillary: 213 mg/dL — ABNORMAL HIGH (ref 70–99)

## 2020-08-23 LAB — MAGNESIUM: Magnesium: 2 mg/dL (ref 1.7–2.4)

## 2020-08-23 LAB — PROCALCITONIN: Procalcitonin: 13.11 ng/mL

## 2020-08-23 MED ORDER — SALINE SPRAY 0.65 % NA SOLN: 1.0000 | NASAL | 0 refills | Status: DC | PRN

## 2020-08-23 MED ORDER — GUAIFENESIN-DM 100-10 MG/5ML PO SYRP: 5.0000 mL | ORAL_SOLUTION | ORAL | 0 refills | Status: DC | PRN

## 2020-08-23 MED ORDER — BISOPROLOL FUMARATE 5 MG PO TABS: 5.0000 mg | ORAL_TABLET | Freq: Every day | ORAL | 0 refills | Status: DC

## 2020-08-23 MED ORDER — AMOXICILLIN-POT CLAVULANATE 875-125 MG PO TABS: 1.0000 | ORAL_TABLET | Freq: Two times a day (BID) | ORAL | 0 refills | Status: AC

## 2020-08-23 NOTE — Progress Notes (Signed)
SATURATION QUALIFICATIONS: (This note is used to comply with regulatory documentation for home oxygen)  Patient Saturations on Room Air at Rest = 85%  Patient Saturations on Room Air while Ambulating = 85%  Patient Saturations on 3 Liters of oxygen while Ambulating = 91%  Please briefly explain why patient needs home oxygen: Patient chronically wears 3L [place] at home. Desaturation on room air.

## 2020-08-23 NOTE — Discharge Summary (Signed)
Physician Discharge Summary  [name] VHQ:[id number] DOB: September 16, 1955 DOA: 08/18/2020  PCP: Asencion Noble, MD  Admit date: 08/18/2020  Discharge date: 08/23/2020  Admitted From:Home  Disposition:  Home  Recommendations for Outpatient Follow-up:  1. Follow up with PCP in 1-2 weeks 2. Follow-up with pulmonology outpatient 3. Continue on Augmentin for 4 more days to finish course of treatment 4. Cardizem changed over to bisoprolol given recurrent vomiting issues with concern for gastroparesis  Home Health: None  Equipment/Devices: Home oxygen 3 L nasal cannula  Discharge Condition:Stable  CODE STATUS: Full  Diet recommendation: Heart Healthy/carb modified  Brief/Interim Summary: [name] is a 65 yo male with a history of COPD (requiring 3L O2 at home), recurrent pneumonia, and history of mycobacterium avium complex who presented to [facility] on 2/10 with increasing SOB, cough, fever, and vomiting. Found to be hypoxic by EMS.Admitted for septic shock.Started on Solu-Medrol, IVF, vancomycin, cefepime. InICU, and was requiring Levophed and 13L HFNC on admission.  His oxygen requirements have been weaned and he came off of his pressors with use of hydrocortisone in a short period of time.  He is now back to his baseline 3 L nasal cannula oxygen requirement and is stable for discharge on Augmentin for 4 more days to finish a complete 10-day course of treatment as recommended by pulmonology.  He will follow-up with pulmonology in the outpatient setting as recommended.  Discharge Diagnoses:  Principal Problem:   Septic shock ([place]) Active Problems:   HTN (hypertension)   Type 2 diabetes mellitus without complication, without long-term current use of insulin ([name])   Liver fibrosis   Mycobacterium avium infection ([place])   COPD GOLD III   Chronic respiratory failure with hypoxia ([place])   Community acquired pneumonia of left lower lobe of lung   Acute on chronic respiratory failure with  hypoxia ([name])   Lobar pneumonia ([place])  Principal discharge diagnosis: Acute on chronic hypoxemic respiratory failure as well as septic shock present on admission secondary to left lower lobe pneumonia.  Discharge Instructions  Discharge Instructions    Diet - low sodium heart healthy   Complete by: As directed    Increase activity slowly   Complete by: As directed      Allergies as of 08/23/2020      Reactions   Baclofen    lethargic    Simvastatin Nausea Only      Medication List    STOP taking these medications   diltiazem 240 MG 24 hr capsule Commonly known as: CARDIZEM CD   Spiriva Respimat 2.5 MCG/ACT Aers Generic drug: Tiotropium Bromide Monohydrate     TAKE these medications   acetaminophen 500 MG tablet Commonly known as: TYLENOL Take 1,000 mg by mouth daily as needed for headache.   albuterol (2.5 MG/3ML) 0.083% nebulizer solution Commonly known as: PROVENTIL Take 3 mLs (2.5 mg total) by nebulization every 2 (two) hours as needed for wheezing or shortness of breath (cough).   ProAir HFA 108 (90 Base) MCG/ACT inhaler Generic drug: albuterol Inhale 2 puffs into the lungs every 4 (four) hours as needed for wheezing or shortness of breath.   ALPRAZolam 0.25 MG tablet Commonly known as: XANAX Take 0.25 mg by mouth 2 (two) times daily as needed for sleep.   amoxicillin-clavulanate 875-125 MG tablet Commonly known as: Augmentin Take 1 tablet by mouth 2 (two) times daily for 4 days.   aspirin EC 81 MG tablet Take 1 tablet (81 mg total) by mouth daily  with breakfast.   atorvastatin 40 MG tablet Commonly known as: LIPITOR Take 1 tablet (40 mg total) by mouth daily.   bisoprolol 5 MG tablet Commonly known as: ZEBETA Take 1 tablet (5 mg total) by mouth daily.   budesonide-formoterol 160-4.5 MCG/ACT inhaler Commonly known as: Symbicort Inhale 2 puffs into the lungs 2 (two) times daily.   [employer] Vitamin C 250 MG tablet Generic drug: ascorbic acid Take 250  mg by mouth daily.   furosemide 20 MG tablet Commonly known as: LASIX Take 20 mg by mouth.   gabapentin 300 MG capsule Commonly known as: NEURONTIN Take 1 capsule (300 mg total) by mouth at bedtime.   glimepiride 2 MG tablet Commonly known as: AMARYL Take 1 tablet (2 mg total) by mouth daily with breakfast. Please do NOT take if you skip breakfast   guaiFENesin 600 MG 12 hr tablet Commonly known as: MUCINEX Take 1 tablet (600 mg total) by mouth 2 (two) times daily.   guaiFENesin-dextromethorphan 100-10 MG/5ML syrup Commonly known as: ROBITUSSIN DM Take 5 mLs by mouth every 4 (four) hours as needed for cough (chest congestion).   Jardiance 25 MG Tabs tablet Generic drug: empagliflozin Take 25 mg by mouth every evening.   multivitamin with minerals Tabs tablet Take 1 tablet by mouth at bedtime.   OneTouch Delica Lancets 96Q Misc   OneTouch Verio test strip Generic drug: glucose blood   OXYGEN Inhale 3 L into the lungs.   pantoprazole 40 MG tablet Commonly known as: PROTONIX Take 1 tablet (40 mg total) by mouth daily.   potassium chloride 10 MEQ CR capsule Commonly known as: MICRO-K Take 10 mEq by mouth daily.   sertraline 50 MG tablet Commonly known as: ZOLOFT Take 1 tablet (50 mg total) by mouth daily.   SitaGLIPtin-MetFORMIN HCl [id number] MG Tb24 Take 1 tablet by mouth daily.   sodium chloride 0.65 % Soln nasal spray Commonly known as: OCEAN Place 1 spray into both nostrils as needed for congestion.   tamsulosin 0.4 MG Caps capsule Commonly known as: FLOMAX Take 0.4 mg by mouth daily.   thiamine 100 MG tablet Take 1 tablet (100 mg total) by mouth daily.   VITAMIN D PO Take 1 tablet by mouth daily.   Vitamin D3 25 MCG (1000 UT) Caps Take 1 capsule by mouth daily.       Follow-up Information    Asencion Noble, MD. Schedule an appointment as soon as possible for a visit in 1 week(s).   Specialty: Internal Medicine Contact information: [address] [place] [address] [phone]        Tanda Rockers, MD Follow up in 1 week(s).   Specialty: Pulmonary Disease Contact information: [place] [address] [phone]              Allergies  Allergen Reactions  . Baclofen     lethargic   . Simvastatin Nausea Only    Consultations:  Pulmonology   Procedures/Studies: DG Chest 1 View  Result Date: 08/22/2020 CLINICAL DATA:  Follow-up pneumonia Shortness of breath EXAM: CHEST  1 VIEW COMPARISON:  08/20/2020 FINDINGS: Heart size within normal limits. Mild unchanged pulmonary vascular congestion. New left mid and basilar airspace opacity appears slightly worse compared to prior exam. Multiple healed right rib fractures again seen. IMPRESSION: Mild interval worsening of left lung airspace disease. Moderate left pleural effusion likely present. Electronically Signed   By: Miachel Roux M.D.   On: 08/22/2020  07:38   [place] CHEST (PLEURAL EFFUSION)  Result Date: 08/22/2020 CLINICAL DATA:  Abnormal chest radiograph, LEFT pleural effusion EXAM: CHEST ULTRASOUND COMPARISON:  Chest radiograph 08/22/2020 FINDINGS: No LEFT pleural effusion identified. IMPRESSION: No LEFT pleural effusion identified. Findings on chest radiograph are therefore likely due to a combination of atelectasis and consolidation in the LEFT lower lobe. Electronically Signed   By: Lavonia Dana M.D.   On: 08/22/2020 11:05   DG OP Swallowing Func-Medicare/Speech Path  Result Date: 08/05/2020 Objective Swallowing Evaluation: Type of Study: MBS-Modified Barium Swallow Study  Patient Details Name: ISSA KOSMICKI MRN: [id number] Date of Birth: 1955-11-22 Today's Date: 08/05/2020 Time: SLP Start Time (ACUTE ONLY): 1050 -SLP Stop Time (ACUTE ONLY): 1113 SLP Time Calculation (min) (ACUTE ONLY): 23 min Past Medical History: Past Medical History: Diagnosis Date . Anxiety  . Asthma  . Chest pain   a. 2003 Cath: nl cors. [name] COPD (chronic  obstructive pulmonary disease) ([place])  . Depression  . Diabetes mellitus  . Dyslipidemia  . Dysrhythmia  . Fracture 12/15  lower back "L-1" . GERD (gastroesophageal reflux disease)  . Hypertension  . NASH (nonalcoholic steatohepatitis) 2018  [name], undergoing routine cirrhosis care . PONV (postoperative nausea and vomiting)  . Splenomegaly  Past Surgical History: Past Surgical History: Procedure Laterality Date . BIOPSY  07/26/2016  Procedure: BIOPSY;  Surgeon: [name], MD;  Location: [facility];  Service: Endoscopy;;  gastric . CARDIAC CATHETERIZATION  10/22/2001  normal coronary arteriea (Dr. Domenic Moras) . COLONOSCOPY  08/31/2011  CVE:LFYBOFBP rectal and colon polyps-treated. Single tubular adenoma. Next TCS 08/2016 . COLONOSCOPY N/A 07/26/2016  Dr. [name]: diverticulosis in sigmoid and descending colon. 6 mm benign splenic flexure. surveillance 5 yeras . ESOPHAGOGASTRODUODENOSCOPY N/A 07/26/2016  Dr. [name]: normal esophagus, portal gastropathy, chronic gastritis, normal duodenum, screening EGD 2 years  . ESOPHAGOGASTRODUODENOSCOPY (EGD) WITH ESOPHAGEAL DILATION N/A 05/15/2013  ZWC:HENIDP esophagus-status post Venia Minks dilation/Portal gastropathy. Antral erosions-status post biopsy. extrinsic compression along lesser curvature likely secondary to splenomegaly. gastric bx benign . HERNIA REPAIR  03/2009 . POLYPECTOMY  07/26/2016  Procedure: POLYPECTOMY;  Surgeon: [name], MD;  Location: [facility];  Service: Endoscopy;;  colon . TRANSTHORACIC ECHOCARDIOGRAM  2011  EF 82-42%, stage 1 diastolic dysfunction, trace TR & pulm valve regurg  . VASECTOMY  6758 HPI: 65 year old male with biopsy-proven NASH in 2018, elastography with F2/F3 chronic thrombocytopenia following with hematology, splenomegaly, and portal gastropathy who has been undergoing routine cirrhosis monitoring.  Last EGD in 2018 with portal gastropathy with recommendations to repeat in 2020.  Most recent abdominal imaging includes CT A/P  with contrast October 2021 with normal-appearing liver.  Recent labs during hospitalizations (pneumonia/COPD exacerbation) in November and December with platelets in the 80-90 range, LFTs WNL, T bili slightly elevated at 1.4. He has had recurring PNA and reports of choking episodes. MBSS ordered to objectively assess the swallowing function.  Subjective: "I do have reflux." Assessment / Plan / Recommendation CHL IP CLINICAL IMPRESSIONS 08/05/2020 Clinical Impression Pt's oropharyngeal swallowing was noted to be within functional limits today. Pt did demonstrate two episodes of flash penetration (one episode of flash frank penetration) when drinking thin liquids with a straw; however this is likely a normal difference in swallowing. Pt demonstrates adequate hyolaryngeal excursion, laryngeal vestibule closure and good airway protection. Pt reports occasional episodes of emesis and globus sensation, SLP reviewed esophageal precautions and provided education of universal aspiration precautions. Note it is possible that Pt experiences episodic aspiration  or aspiration during episodes of emesis (related to GERD). Recommend Pt continue with current diet with implementation of esophageal precations and there are no further ST needs noted at this time, defer to GI to further recommend and treat esophageal related difficulties. SLP Visit Diagnosis Dysphagia, unspecified (R13.10) Attention and concentration deficit following -- Frontal lobe and executive function deficit following -- Impact on safety and function Mild aspiration risk   CHL IP TREATMENT RECOMMENDATION 08/05/2020 Treatment Recommendations No treatment recommended at this time   Prognosis 05/24/2020 Prognosis for Safe Diet Advancement Good Barriers to Reach Goals -- Barriers/Prognosis Comment -- CHL IP DIET RECOMMENDATION 08/05/2020 SLP Diet Recommendations Regular solids;Thin liquid Liquid Administration via Straw;Cup Medication Administration Whole meds with  liquid Compensations Slow rate;Small sips/bites Postural Changes Seated upright at 90 degrees;Remain semi-upright after after feeds/meals (Comment)   CHL IP OTHER RECOMMENDATIONS 08/05/2020 Recommended Consults -- Oral Care Recommendations Oral care BID Other Recommendations --   CHL IP FOLLOW UP RECOMMENDATIONS 05/24/2020 Follow up Recommendations None   No flowsheet data found.     CHL IP ORAL PHASE 08/05/2020 Oral Phase WFL Oral - Pudding Teaspoon -- Oral - Pudding Cup -- Oral - Honey Teaspoon -- Oral - Honey Cup -- Oral - Nectar Teaspoon -- Oral - Nectar Cup -- Oral - Nectar Straw -- Oral - Thin Teaspoon -- Oral - Thin Cup -- Oral - Thin Straw -- Oral - Puree -- Oral - Mech Soft -- Oral - Regular -- Oral - Multi-Consistency -- Oral - Pill -- Oral Phase - Comment --  CHL IP PHARYNGEAL PHASE 08/05/2020 Pharyngeal Phase WFL Pharyngeal- Pudding Teaspoon -- Pharyngeal -- Pharyngeal- Pudding Cup -- Pharyngeal -- Pharyngeal- Honey Teaspoon -- Pharyngeal -- Pharyngeal- Honey Cup -- Pharyngeal -- Pharyngeal- Nectar Teaspoon -- Pharyngeal -- Pharyngeal- Nectar Cup -- Pharyngeal -- Pharyngeal- Nectar Straw -- Pharyngeal -- Pharyngeal- Thin Teaspoon -- Pharyngeal -- Pharyngeal- Thin Cup -- Pharyngeal -- Pharyngeal- Thin Straw -- Pharyngeal -- Pharyngeal- Puree -- Pharyngeal -- Pharyngeal- Mechanical Soft -- Pharyngeal -- Pharyngeal- Regular -- Pharyngeal -- Pharyngeal- Multi-consistency -- Pharyngeal -- Pharyngeal- Pill -- Pharyngeal -- Pharyngeal Comment --  CHL IP CERVICAL ESOPHAGEAL PHASE 08/05/2020 Cervical Esophageal Phase WFL Pudding Teaspoon -- Pudding Cup -- Honey Teaspoon -- Honey Cup -- Nectar Teaspoon -- Nectar Cup -- Nectar Straw -- Thin Teaspoon -- Thin Cup -- Thin Straw -- Puree -- Mechanical Soft -- Regular -- Multi-consistency -- Pill -- Cervical Esophageal Comment -- Amelia H. Roddie Mc, CCC-SLP Speech Language Pathologist Wende Bushy 08/05/2020, 12:08 PM            CLINICAL DATA:  Dysphagia.  Aspiration EXAM: MODIFIED BARIUM SWALLOW TECHNIQUE: Different consistencies of barium were administered orally to the patient by the Speech Pathologist. Imaging of the pharynx was performed in the lateral projection. The radiologist was present in the fluoroscopy room for this study, providing personal supervision. FLUOROSCOPY TIME:  Fluoroscopy Time:  1 minutes 54 seconds Radiation Exposure Index (if provided by the fluoroscopic device): 28.8 mGy Number of Acquired Spot Images: multiple fluoroscopic screen captures COMPARISON:  None FINDINGS: Flash laryngeal penetration of thin barium by straw and cup. No aspiration. No residuals. Applesauce and cracker consistencies appeared normal with swallowing. Patient swallowed a 12.5 mm diameter barium tablet with thin barium without difficulty. The tablet passed to the stomach without obstruction. IMPRESSION: Flash laryngeal penetration of thin barium. Otherwise negative exam. Please refer to the Speech Pathologists report for complete details and recommendations. Electronically Signed   By: [name]  Thornton Papas M.D.   On: 08/05/2020 11:41   DG Chest Port 1 View  Result Date: 08/20/2020 CLINICAL DATA:  65 year old male with shortness of breath, weakness. Evidence of left lung pneumonia and effusion on recent portable chest. EXAM: PORTABLE CHEST 1 VIEW COMPARISON:  Portable chest 08/18/2020 and earlier. FINDINGS: Portable AP upright view at 0613 hours. Underlying large lung volumes. Stable cardiac size and mediastinal contours. Coarse, chronic pulmonary interstitial opacity in both lungs appears stable since last year with large calcified right upper lobe granuloma. Superimposed confluent airspace disease in the left lower lung with probable pleural effusion. The degree of left lung opacity has mildly progressed since yesterday. Only the apex appears completely spared now. Paucity of bowel gas. Stable visualized osseous structures. IMPRESSION: Chronic lung disease with  superimposed confluent left lower lung pneumonia, associated pleural effusion which have mildly progressed from yesterday. Electronically Signed   By: Genevie Ann M.D.   On: 08/20/2020 07:49   DG Chest Port 1 View  Result Date: 08/18/2020 CLINICAL DATA:  Shortness of breath, weakness. EXAM: PORTABLE CHEST 1 VIEW COMPARISON:  July 22, 2020. FINDINGS: The heart size and mediastinal contours are within normal limits. No pneumothorax is noted. Large left lower lobe airspace opacity is noted consistent with pneumonia. Small left pleural effusion is noted. Stable calcified granuloma is noted in right upper lobe. The visualized skeletal structures are unremarkable. IMPRESSION: Large left lower lobe pneumonia. Small left pleural effusion. Electronically Signed   By: Marijo Conception M.D.   On: 08/18/2020 16:06   Split night study  Result Date: 08/01/2020 Rigoberto Noel, MD     08/09/2020  8:32 AM Patient Name: Alferd Patee Date: 08/01/2020 Gender: Male D.O.B: October 27, 1955 Age (years): 15 Referring Provider: Kara Mead MD, ABSM Height (inches): 67 Interpreting Physician: Kara Mead MD, ABSM Weight (lbs): 162 RPSGT: Rosebud Poles BMI: 25 MRN: [id number] Neck Size: 16.00 <br> <br> CLINICAL INFORMATION Sleep Study Type: NPSG Indication for sleep study: loud snoring, daytime somnolence,COPD on oxygen Epworth Sleepiness Score: 1 SLEEP STUDY TECHNIQUE As per the AASM Manual for the Scoring of Sleep and Associated Events v2.3 (April 2016) with a hypopnea requiring 4% desaturations. The channels recorded and monitored were frontal, central and occipital EEG, electrooculogram (EOG), submentalis EMG (chin), nasal and oral airflow, thoracic and abdominal wall motion, anterior tibialis EMG, snore microphone, electrocardiogram, and pulse oximetry. MEDICATIONS Medications self-administered by patient taken the night of the study : N/A SLEEP ARCHITECTURE The study was initiated at 10:11:10 PM and ended at 4:42:06 AM. Sleep onset  time was 12.3 minutes and the sleep efficiency was 90.3%%. The total sleep time was 353 minutes. Stage REM latency was 121.0 minutes. The patient spent 4.1%% of the night in stage N1 sleep, 41.4%% in stage N2 sleep, 21.4%% in stage N3 and 33.1% in REM. Alpha intrusion was absent. Supine sleep was 3.82%. RESPIRATORY PARAMETERS The overall apnea/hypopnea index (AHI) was 0.8 per hour. There were 0 total apneas, including 0 obstructive, 0 central and 0 mixed apneas. There were 5 hypopneas and 9 RERAs. The AHI during Stage REM sleep was 0.5 per hour. AHI while supine was 17.8 per hour. The mean oxygen saturation was 87.8%. The minimum SpO2 during sleep was 71.0%. He spent 204 mins of TST with saturation less than 88% moderate snoring was noted during this study. CARDIAC DATA The 2 lead EKG demonstrated sinus rhythm. The mean heart rate was 78.7 beats per minute. Other EKG findings include: None. LEG MOVEMENT DATA The total  PLMS were 0 with a resulting PLMS index of 0.0. Associated arousal with leg movement index was 0.3 . IMPRESSIONS - No significant obstructive sleep apnea occurred during this study (AHI = 0.8/h). Minimal supine sleep was noted. - No significant central sleep apnea occurred during this study (CAI = 0.0/h). - Moderate oxygen desaturation was noted during this study (Min O2 = 71.0%). - The patient snored with moderate snoring volume. - No cardiac abnormalities were noted during this study. - Clinically significant periodic limb movements did not occur during sleep. No significant associated arousals. DIAGNOSIS - Nocturnal Hypoxemia (G47.36) RECOMMENDATIONS - Use oxygen during sleep. - Positional therapy avoiding supine position during sleep. - Avoid alcohol, sedatives and other CNS depressants that may worsen sleep apnea and disrupt normal sleep architecture. - Sleep hygiene should be reviewed to assess factors that may improve sleep quality. - Weight management and regular exercise should be initiated or  continued if appropriate. Kara Mead MD Board Certified in [place]  Result Date: 08/02/2020 Ordered by an unspecified provider.     Discharge Exam: Vitals:   08/23/20 1000 08/23/20 1100  BP: (!) 107/46   Pulse: (!) 102 (!) 109  Resp: (!) 23 (!) 25  Temp:    SpO2: 94% 92%   Vitals:   08/23/20 0803 08/23/20 0900 08/23/20 1000 08/23/20 1100  BP:  (!) 122/56 (!) 107/46   Pulse:  (!) 107 (!) 102 (!) 109  Resp:  (!) 24 (!) 23 (!) 25  Temp: 97.9 F (36.6 C)     TempSrc: Oral     SpO2:  95% 94% 92%  Weight:      Height:        General: Pt is alert, awake, not in acute distress Cardiovascular: RRR, S1/S2 +, no rubs, no gallops Respiratory: CTA bilaterally, no wheezing, no rhonchi, 3 L nasal cannula oxygen Abdominal: Soft, NT, ND, bowel sounds + Extremities: no edema, no cyanosis    The results of significant diagnostics from this hospitalization (including imaging, microbiology, ancillary and laboratory) are listed below for reference.     Microbiology: Recent Results (from the past 240 hour(s))  Resp Panel by RT-PCR (Flu A&B, Covid) Nasopharyngeal Swab     Status: None   Collection Time: 08/18/20  3:35 PM   Specimen: Nasopharyngeal Swab; Nasopharyngeal(NP) swabs in vial transport medium  Result Value Ref Range Status   SARS Coronavirus 2 by RT PCR NEGATIVE NEGATIVE Final    Comment: (NOTE) SARS-CoV-2 target nucleic acids are NOT DETECTED.  The SARS-CoV-2 RNA is generally detectable in upper respiratory specimens during the acute phase of infection. The lowest concentration of SARS-CoV-2 viral copies this assay can detect is 138 copies/mL. A negative result does not preclude SARS-Cov-2 infection and should not be used as the sole basis for treatment or other patient management decisions. A negative result may occur with  improper specimen collection/handling, submission of specimen other than nasopharyngeal swab, presence of viral  mutation(s) within the areas targeted by this assay, and inadequate number of viral copies(<138 copies/mL). A negative result must be combined with clinical observations, patient history, and epidemiological information. The expected result is Negative.  Fact Sheet for Patients:  EntrepreneurPulse.com.au  Fact Sheet for Healthcare Providers:  IncredibleEmployment.be  This test is no t yet approved or cleared by the [place] FDA and  has been authorized for detection and/or diagnosis of SARS-CoV-2 by FDA under an Emergency Use Authorization (EUA). This EUA will remain  in  effect (meaning this test can be used) for the duration of the COVID-19 declaration under Section 564(b)(1) of the Act, 21 U.S.C.section 360bbb-3(b)(1), unless the authorization is terminated  or revoked sooner.       Influenza A by PCR NEGATIVE NEGATIVE Final   Influenza B by PCR NEGATIVE NEGATIVE Final    Comment: (NOTE) The Xpert Xpress SARS-CoV-2/FLU/RSV plus assay is intended as an aid in the diagnosis of influenza from Nasopharyngeal swab specimens and should not be used as a sole basis for treatment. Nasal washings and aspirates are unacceptable for Xpert Xpress SARS-CoV-2/FLU/RSV testing.  Fact Sheet for Patients: EntrepreneurPulse.com.au  Fact Sheet for Healthcare Providers: IncredibleEmployment.be  This test is not yet approved or cleared by the [place] FDA and has been authorized for detection and/or diagnosis of SARS-CoV-2 by FDA under an Emergency Use Authorization (EUA). This EUA will remain in effect (meaning this test can be used) for the duration of the COVID-19 declaration under Section 564(b)(1) of the Act, 21 U.S.C. section 360bbb-3(b)(1), unless the authorization is terminated or revoked.  Performed at [facility], [address], [place], [place] [address]   MRSA PCR Screening     Status: None    Collection Time: 08/18/20  3:35 PM   Specimen: Nasopharyngeal  Result Value Ref Range Status   MRSA by PCR NEGATIVE NEGATIVE Final    Comment:        The GeneXpert MRSA Assay (FDA approved for NASAL specimens only), is one component of a comprehensive MRSA colonization surveillance program. It is not intended to diagnose MRSA infection nor to guide or monitor treatment for MRSA infections. Performed at [facility], [address], [place], [place] [address]   Blood Culture (routine x 2)     Status: None   Collection Time: 08/18/20  3:48 PM   Specimen: BLOOD LEFT FOREARM  Result Value Ref Range Status   Specimen Description BLOOD LEFT FOREARM  Final   Special Requests   Final    BOTTLES DRAWN AEROBIC AND ANAEROBIC Blood Culture adequate volume   Culture   Final    NO GROWTH 5 DAYS Performed at [facility], [address], [place], [place] [address]    Report Status 08/23/2020 FINAL  Final  Blood Culture (routine x 2)     Status: None   Collection Time: 08/18/20  3:48 PM   Specimen: BLOOD RIGHT WRIST  Result Value Ref Range Status   Specimen Description BLOOD RIGHT WRIST  Final   Special Requests   Final    BOTTLES DRAWN AEROBIC AND ANAEROBIC Blood Culture adequate volume   Culture   Final    NO GROWTH 5 DAYS Performed at [facility], [address] [place], [place] [address]    Report Status 08/23/2020 FINAL  Final     Labs: BNP (last 3 results) Recent Labs    05/23/20 1615  BNP 102.7*   Basic Metabolic Panel: Recent Labs  Lab 08/19/20 0451 08/20/20 0533 08/21/20 0311 08/22/20 0402 08/23/20 0441  NA 140 137 138 140 139  K 4.5 3.8 3.3* 3.4* 3.9  CL 107 102 102 104 105  CO2 _0 GLUCOSE 192* 154* 141* 134* 121*  BUN _1 CREATININE 0.91 0.72 0.59* 0.55* 0.78  CALCIUM 8.5* 8.6* 8.7* 8.6* 8.5*  MG  --  2.0 2.0 2.1 2.0   Liver Function Tests: Recent Labs  Lab 08/18/20 1548 08/19/20 0451 08/20/20 0533  AST 20 32  21  ALT _0 ALKPHOS 74 40 45  BILITOT 2.7* 1.6* 1.4*  PROT 7.5 6.7 6.7  ALBUMIN 3.6 3.0* 2.8*   No results for input(s): LIPASE, AMYLASE in the last 168 hours. No results for input(s): AMMONIA in the last 168 hours. CBC: Recent Labs  Lab 08/18/20 1548 08/19/20 0451 08/20/20 0533 08/21/20 0311 08/22/20 0402 08/23/20 0441  WBC 5.8 13.8* 7.4 10.3 8.1 8.7  NEUTROABS 3.0  --   --   --   --   --   HGB 16.4 14.5 13.0 13.1 13.9 13.9  HCT 52.5* 47.6 41.4 42.4 44.8 45.4  MCV 83.5 84.7 84.0 83.1 83.6 84.9  PLT 108* 113* 82* 99* 111* 132*   Cardiac Enzymes: No results for input(s): CKTOTAL, CKMB, CKMBINDEX, TROPONINI in the last 168 hours. BNP: Invalid input(s): POCBNP CBG: Recent Labs  Lab 08/22/20 2034 08/23/20 0011 08/23/20 0407 08/23/20 0752 08/23/20 1111  GLUCAP 135* 158* 134* 136* 213*   D-Dimer No results for input(s): DDIMER in the last 72 hours. Hgb A1c No results for input(s): HGBA1C in the last 72 hours. Lipid Profile No results for input(s): CHOL, HDL, LDLCALC, TRIG, CHOLHDL, LDLDIRECT in the last 72 hours. Thyroid function studies No results for input(s): TSH, T4TOTAL, T3FREE, THYROIDAB in the last 72 hours.  Invalid input(s): FREET3 Anemia work up No results for input(s): VITAMINB12, FOLATE, FERRITIN, TIBC, IRON, RETICCTPCT in the last 72 hours. Urinalysis    Component Value Date/Time   COLORURINE YELLOW 05/23/2020 1549   APPEARANCEUR CLEAR 05/23/2020 1549   LABSPEC 1.020 05/23/2020 1549   PHURINE 5.0 05/23/2020 1549   GLUCOSEU >=500 (A) 05/23/2020 1549   HGBUR NEGATIVE 05/23/2020 1549   BILIRUBINUR NEGATIVE 05/23/2020 1549   KETONESUR NEGATIVE 05/23/2020 1549   PROTEINUR NEGATIVE 05/23/2020 1549   NITRITE NEGATIVE 05/23/2020 1549   LEUKOCYTESUR NEGATIVE 05/23/2020 1549   Sepsis Labs Invalid input(s): PROCALCITONIN,  WBC,  LACTICIDVEN Microbiology Recent Results (from the past 240 hour(s))  Resp Panel by RT-PCR (Flu A&B, Covid) Nasopharyngeal Swab      Status: None   Collection Time: 08/18/20  3:35 PM   Specimen: Nasopharyngeal Swab; Nasopharyngeal(NP) swabs in vial transport medium  Result Value Ref Range Status   SARS Coronavirus 2 by RT PCR NEGATIVE NEGATIVE Final    Comment: (NOTE) SARS-CoV-2 target nucleic acids are NOT DETECTED.  The SARS-CoV-2 RNA is generally detectable in upper respiratory specimens during the acute phase of infection. The lowest concentration of SARS-CoV-2 viral copies this assay can detect is 138 copies/mL. A negative result does not preclude SARS-Cov-2 infection and should not be used as the sole basis for treatment or other patient management decisions. A negative result may occur with  improper specimen collection/handling, submission of specimen other than nasopharyngeal swab, presence of viral mutation(s) within the areas targeted by this assay, and inadequate number of viral copies(<138 copies/mL). A negative result must be combined with clinical observations, patient history, and epidemiological information. The expected result is Negative.  Fact Sheet for Patients:  EntrepreneurPulse.com.au  Fact Sheet for Healthcare Providers:  IncredibleEmployment.be  This test is no t yet approved or cleared by the [place] FDA and  has been authorized for detection and/or diagnosis of SARS-CoV-2 by FDA under an Emergency Use Authorization (EUA). This EUA will remain  in effect (meaning this test can be used) for the duration of the COVID-19 declaration under Section 564(b)(1) of the Act, 21 U.S.C.section 360bbb-3(b)(1), unless the authorization is terminated  or  revoked sooner.       Influenza A by PCR NEGATIVE NEGATIVE Final   Influenza B by PCR NEGATIVE NEGATIVE Final    Comment: (NOTE) The Xpert Xpress SARS-CoV-2/FLU/RSV plus assay is intended as an aid in the diagnosis of influenza from Nasopharyngeal swab specimens and should not be used as a sole basis  for treatment. Nasal washings and aspirates are unacceptable for Xpert Xpress SARS-CoV-2/FLU/RSV testing.  Fact Sheet for Patients: EntrepreneurPulse.com.au  Fact Sheet for Healthcare Providers: IncredibleEmployment.be  This test is not yet approved or cleared by the [place] FDA and has been authorized for detection and/or diagnosis of SARS-CoV-2 by FDA under an Emergency Use Authorization (EUA). This EUA will remain in effect (meaning this test can be used) for the duration of the COVID-19 declaration under Section 564(b)(1) of the Act, 21 U.S.C. section 360bbb-3(b)(1), unless the authorization is terminated or revoked.  Performed at [facility], [address] [place], [place] [address]   MRSA PCR Screening     Status: None   Collection Time: 08/18/20  3:35 PM   Specimen: Nasopharyngeal  Result Value Ref Range Status   MRSA by PCR NEGATIVE NEGATIVE Final    Comment:        The GeneXpert MRSA Assay (FDA approved for NASAL specimens only), is one component of a comprehensive MRSA colonization surveillance program. It is not intended to diagnose MRSA infection nor to guide or monitor treatment for MRSA infections. Performed at [facility], [address] [place], [place] [address]   Blood Culture (routine x 2)     Status: None   Collection Time: 08/18/20  3:48 PM   Specimen: BLOOD LEFT FOREARM  Result Value Ref Range Status   Specimen Description BLOOD LEFT FOREARM  Final   Special Requests   Final    BOTTLES DRAWN AEROBIC AND ANAEROBIC Blood Culture adequate volume   Culture   Final    NO GROWTH 5 DAYS Performed at [facility], [address], [place], [place] [address]    Report Status 08/23/2020 FINAL  Final  Blood Culture (routine x 2)     Status: None   Collection Time: 08/18/20  3:48 PM   Specimen: BLOOD RIGHT WRIST  Result Value Ref Range Status   Specimen Description BLOOD RIGHT WRIST  Final   Special Requests    Final    BOTTLES DRAWN AEROBIC AND ANAEROBIC Blood Culture adequate volume   Culture   Final    NO GROWTH 5 DAYS Performed at [facility], [address] [place], [place] [address]    Report Status 08/23/2020 FINAL  Final     Time coordinating discharge: 35 minutes  SIGNED:   Rodena Goldmann, DO [facility] 08/23/2020, 11:54 AM  If 7PM-7AM, please contact night-coverage [email or web address]

## 2020-08-23 NOTE — Telephone Encounter (Signed)
Called [name] and went over scheduled hospital follow up office visit with Dr Melvyn Novas in 2-4 weeks per Dr Elsworth Soho. [name] agreeable to Thursday 09/15/2020 at [place] at the [place] office with Dr Melvyn Novas. Offered to make appointment next week if [name] preferred but [name] declined at this time stating 09/15/20 would be ok. [name] advised to please call if he wanted to be seen sooner than 09/15/20 and [name] expressed full understanding. Nothing further needed at this time.

## 2020-08-23 NOTE — Telephone Encounter (Signed)
-----   Message from Rigoberto Noel, MD sent at 08/23/2020 11:26 AM EST ----- Hosp FU OV with MW in 2-4 weeks please  RA

## 2020-08-23 NOTE — Telephone Encounter (Signed)
Scheduled hospital follow up office visit with Dr Melvyn Novas per Dr Elsworth Soho for Thursday 09/15/20 at [place] at the [place] office. Appointment will print out on AVS when discharged from hospital. Nothing further needed at this time.

## 2020-08-23 NOTE — TOC Progression Note (Signed)
Transition of Care [facility]) - Progression Note    Patient Details  Name: [name] MRN: [id number] Date of Birth: September 07, 1955  Transition of Care [facility]) CM/SW Contact  Salome Arnt, [place] Phone Number: 08/23/2020, 11:51 AM  Clinical Narrative:  Pt reports his portable O2 tanks at home are empty. LCSW confirmed pt's O2 is through [employer] and [employer] will deliver portable tank to pt's room prior to d/c today.          Barriers to Discharge: Continued Medical Work up  Expected Discharge Plan and Services           Expected Discharge Date: 08/23/20                                     Social Determinants of Health (SDOH) Interventions    Readmission Risk Interventions Readmission Risk Prevention Plan 06/12/2020  Transportation Screening Complete  HRI or [place] Complete  Social Work Consult for [place] Planning/Counseling Complete  Palliative Care Screening Not Applicable  Medication Review [employer] photographer) Complete  Some recent data might be hidden

## 2020-08-23 NOTE — Evaluation (Signed)
Physical Therapy Evaluation Patient Details Name: [name] MRN: [id number] DOB: 04/11/1956 Today's Date: 08/23/2020   History of Present Illness  [name] is a 65 y.o. male with medical history significant for COPD with chronic respiratory failure on 3 L, ??  Mycobacterium avium infection, diabetes mellitus, hypertension, NASH.  Patient was brought to the ED with complaints of fever and vomiting that started last night.  Patient's wife is present at bedside and helps with the history.  She reports chronic difficulty breathing that was not significantly changed, but like yesterday patient had a fever of 101.4, so she gave him Tylenol and then he threw up.  He has chronic cough, she does not think has changed.    Clinical Impression  Patient functioning at baseline for functional mobility and gait.  Plan:  Patient discharged from physical therapy to care of nursing for ambulation daily as tolerated for length of stay.     Follow Up Recommendations No PT follow up    Equipment Recommendations  None recommended by PT    Recommendations for Other Services       Precautions / Restrictions Precautions Precautions: None Restrictions Weight Bearing Restrictions: No      Mobility  Bed Mobility Overal bed mobility: Independent                  Transfers Overall transfer level: Modified independent                  Ambulation/Gait Ambulation/Gait assistance: Modified independent (Device/Increase time) Gait Distance (Feet): 100 Feet Assistive device: None Gait Pattern/deviations: Decreased step length - right;Decreased step length - left;Decreased stride length;Drifts right/left Gait velocity: decreased   General Gait Details: slightly labored cadence with occasional drifting right/left without loss of balance, on 3 LPM with SpO2 at 91%  Stairs            Wheelchair Mobility    Modified Rankin (Stroke Patients Only)       Balance Overall  balance assessment: Mild deficits observed, not formally tested                                           Pertinent Vitals/Pain Pain Assessment: No/denies pain    Home Living Family/patient expects to be discharged to:: Private residence Living Arrangements: Spouse/significant other Available Help at Discharge: Family;Available PRN/intermittently Type of Home: Mobile home Home Access: Stairs to enter Entrance Stairs-Rails: Right;Left;Can reach both Entrance Stairs-Number of Steps: 3-4 Home Layout: One level Home Equipment: Walker - 2 wheels;Cane - single point;Wheelchair - [employer];Shower seat - built in      Prior Function Level of Independence: Needs assistance   Gait / Transfers Assistance Needed: household and short distanced community ambulator without AD, rarely drives  ADL's / Homemaking Assistance Needed: assisted by spouse for community ADL's        Hand Dominance        Extremity/Trunk Assessment   Upper Extremity Assessment Upper Extremity Assessment: Overall WFL for tasks assessed    Lower Extremity Assessment Lower Extremity Assessment: Overall WFL for tasks assessed    Cervical / Trunk Assessment Cervical / Trunk Assessment: Normal  Communication   Communication: No difficulties  Cognition Arousal/Alertness: Awake/alert Behavior During Therapy: WFL for tasks assessed/performed Overall Cognitive Status: Within Functional Limits for tasks assessed  General Comments      Exercises     Assessment/Plan    PT Assessment Patent does not need any further PT services  PT Problem List         PT Treatment Interventions      PT Goals (Current goals can be found in the Care Plan section)  Acute Rehab PT Goals Patient Stated Goal: return home with family to assist PT Goal Formulation: With patient Potential to Achieve Goals: Good    Frequency     Barriers  to discharge        Co-evaluation               AM-PAC PT "6 Clicks" Mobility  Outcome Measure Help needed turning from your back to your side while in a flat bed without using bedrails?: None Help needed moving from lying on your back to sitting on the side of a flat bed without using bedrails?: None Help needed moving to and from a bed to a chair (including a wheelchair)?: None Help needed standing up from a chair using your arms (e.g., wheelchair or bedside chair)?: None Help needed to walk in hospital room?: None Help needed climbing 3-5 steps with a railing? : None 6 Click Score: 24    End of Session Equipment Utilized During Treatment: Oxygen Activity Tolerance: Patient tolerated treatment well;Patient limited by fatigue Patient left: in bed;with call bell/phone within reach Nurse Communication: Mobility status PT Visit Diagnosis: Unsteadiness on feet (R26.81);Other abnormalities of gait and mobility (R26.89);Muscle weakness (generalized) (M62.81)    Time: 6415-8309 PT Time Calculation (min) (ACUTE ONLY): 20 min   Charges:   PT Evaluation $PT Eval Moderate Complexity: 1 Mod PT Treatments $Therapeutic Activity: 8-22 mins        11:58 AM, 08/23/20 Lonell Grandchild, MPT Physical Therapist with [facility] 336 [phone] office [phone] mobile phone

## 2020-08-24 DIAGNOSIS — J449 Chronic obstructive pulmonary disease, unspecified: Secondary | ICD-10-CM | POA: Diagnosis not present

## 2020-08-26 ENCOUNTER — Telehealth: Payer: Self-pay | Admitting: Internal Medicine

## 2020-08-26 NOTE — Telephone Encounter (Signed)
Recv'd records from [place] of [place] forwarded 6 pages to Dr. Christinia Gully 2/18/22fbg

## 2020-08-29 DIAGNOSIS — J449 Chronic obstructive pulmonary disease, unspecified: Secondary | ICD-10-CM | POA: Diagnosis not present

## 2020-08-31 DIAGNOSIS — E1140 Type 2 diabetes mellitus with diabetic neuropathy, unspecified: Secondary | ICD-10-CM | POA: Diagnosis not present

## 2020-08-31 DIAGNOSIS — J181 Lobar pneumonia, unspecified organism: Secondary | ICD-10-CM | POA: Diagnosis not present

## 2020-08-31 DIAGNOSIS — J189 Pneumonia, unspecified organism: Secondary | ICD-10-CM | POA: Diagnosis not present

## 2020-08-31 DIAGNOSIS — R7309 Other abnormal glucose: Secondary | ICD-10-CM | POA: Diagnosis not present

## 2020-09-03 ENCOUNTER — Other Ambulatory Visit: Payer: Self-pay

## 2020-09-03 ENCOUNTER — Encounter (HOSPITAL_COMMUNITY): Payer: Self-pay | Admitting: *Deleted

## 2020-09-03 ENCOUNTER — Emergency Department (HOSPITAL_COMMUNITY): Payer: HMO

## 2020-09-03 ENCOUNTER — Inpatient Hospital Stay (HOSPITAL_COMMUNITY)
Admission: EM | Admit: 2020-09-03 | Discharge: 2020-09-06 | DRG: 190 | Disposition: A | Discharge status: Home / Self Care | Payer: HMO | Attending: Internal Medicine | Admitting: Internal Medicine

## 2020-09-03 DIAGNOSIS — F419 Anxiety disorder, unspecified: Secondary | ICD-10-CM | POA: Diagnosis not present

## 2020-09-03 DIAGNOSIS — Z8701 Personal history of pneumonia (recurrent): Secondary | ICD-10-CM | POA: Diagnosis not present

## 2020-09-03 DIAGNOSIS — Z825 Family history of asthma and other chronic lower respiratory diseases: Secondary | ICD-10-CM

## 2020-09-03 DIAGNOSIS — R0602 Shortness of breath: Secondary | ICD-10-CM | POA: Diagnosis not present

## 2020-09-03 DIAGNOSIS — Z6823 Body mass index (BMI) 23.0-23.9, adult: Secondary | ICD-10-CM

## 2020-09-03 DIAGNOSIS — Z87891 Personal history of nicotine dependence: Secondary | ICD-10-CM

## 2020-09-03 DIAGNOSIS — E785 Hyperlipidemia, unspecified: Secondary | ICD-10-CM | POA: Diagnosis not present

## 2020-09-03 DIAGNOSIS — E440 Moderate protein-calorie malnutrition: Secondary | ICD-10-CM | POA: Diagnosis not present

## 2020-09-03 DIAGNOSIS — Z888 Allergy status to other drugs, medicaments and biological substances status: Secondary | ICD-10-CM | POA: Diagnosis not present

## 2020-09-03 DIAGNOSIS — Z20822 Contact with and (suspected) exposure to covid-19: Secondary | ICD-10-CM | POA: Diagnosis not present

## 2020-09-03 DIAGNOSIS — Z79899 Other long term (current) drug therapy: Secondary | ICD-10-CM

## 2020-09-03 DIAGNOSIS — E119 Type 2 diabetes mellitus without complications: Secondary | ICD-10-CM | POA: Diagnosis not present

## 2020-09-03 DIAGNOSIS — K760 Fatty (change of) liver, not elsewhere classified: Secondary | ICD-10-CM | POA: Diagnosis not present

## 2020-09-03 DIAGNOSIS — K7581 Nonalcoholic steatohepatitis (NASH): Secondary | ICD-10-CM | POA: Diagnosis present

## 2020-09-03 DIAGNOSIS — I10 Essential (primary) hypertension: Secondary | ICD-10-CM | POA: Diagnosis present

## 2020-09-03 DIAGNOSIS — J441 Chronic obstructive pulmonary disease with (acute) exacerbation: Principal | ICD-10-CM | POA: Diagnosis present

## 2020-09-03 DIAGNOSIS — Z8249 Family history of ischemic heart disease and other diseases of the circulatory system: Secondary | ICD-10-CM

## 2020-09-03 DIAGNOSIS — J9620 Acute and chronic respiratory failure, unspecified whether with hypoxia or hypercapnia: Secondary | ICD-10-CM | POA: Diagnosis present

## 2020-09-03 DIAGNOSIS — Z9981 Dependence on supplemental oxygen: Secondary | ICD-10-CM | POA: Diagnosis not present

## 2020-09-03 DIAGNOSIS — K219 Gastro-esophageal reflux disease without esophagitis: Secondary | ICD-10-CM | POA: Diagnosis not present

## 2020-09-03 DIAGNOSIS — Z7951 Long term (current) use of inhaled steroids: Secondary | ICD-10-CM | POA: Diagnosis not present

## 2020-09-03 DIAGNOSIS — J189 Pneumonia, unspecified organism: Secondary | ICD-10-CM | POA: Diagnosis present

## 2020-09-03 DIAGNOSIS — Z833 Family history of diabetes mellitus: Secondary | ICD-10-CM | POA: Diagnosis not present

## 2020-09-03 DIAGNOSIS — J9621 Acute and chronic respiratory failure with hypoxia: Secondary | ICD-10-CM | POA: Diagnosis present

## 2020-09-03 DIAGNOSIS — Z7982 Long term (current) use of aspirin: Secondary | ICD-10-CM | POA: Diagnosis not present

## 2020-09-03 DIAGNOSIS — R0603 Acute respiratory distress: Secondary | ICD-10-CM | POA: Diagnosis not present

## 2020-09-03 DIAGNOSIS — A419 Sepsis, unspecified organism: Secondary | ICD-10-CM | POA: Diagnosis not present

## 2020-09-03 DIAGNOSIS — F32A Depression, unspecified: Secondary | ICD-10-CM | POA: Diagnosis not present

## 2020-09-03 LAB — URINALYSIS, ROUTINE W REFLEX MICROSCOPIC
Bilirubin Urine: NEGATIVE
Glucose, UA: >=500 mg/dL — AB
Hgb urine dipstick: NEGATIVE
Ketones, ur: NEGATIVE mg/dL
Leukocytes,Ua: NEGATIVE
Nitrite: NEGATIVE
Protein, ur: 30 mg/dL — AB
Specific Gravity, Urine: 1.022 (ref 1.005–1.030)
pH: 5 (ref 5.0–8.0)

## 2020-09-03 LAB — CBC WITH DIFFERENTIAL/PLATELET
Abs Immature Granulocytes: 0.04 10*3/uL (ref 0.00–0.07)
Basophils Absolute: 0 10*3/uL (ref 0.0–0.1)
Basophils Relative: 0 %
Eosinophils Absolute: 0 10*3/uL (ref 0.0–0.5)
Eosinophils Relative: 0 %
HCT: 42.1 % (ref 39.0–52.0)
Hemoglobin: 12.9 g/dL — ABNORMAL LOW (ref 13.0–17.0)
Immature Granulocytes: 0 %
Lymphocytes Relative: 5 %
Lymphs Abs: 0.5 10*3/uL — ABNORMAL LOW (ref 0.7–4.0)
MCH: 25.6 pg — ABNORMAL LOW (ref 26.0–34.0)
MCHC: 30.6 g/dL (ref 30.0–36.0)
MCV: 83.7 fL (ref 80.0–100.0)
Monocytes Absolute: 0.4 10*3/uL (ref 0.1–1.0)
Monocytes Relative: 4 %
Neutro Abs: 9.1 10*3/uL — ABNORMAL HIGH (ref 1.7–7.7)
Neutrophils Relative %: 91 %
Platelets: 243 10*3/uL (ref 150–400)
RBC: 5.03 MIL/uL (ref 4.22–5.81)
RDW: 15.9 % — ABNORMAL HIGH (ref 11.5–15.5)
WBC: 10 10*3/uL (ref 4.0–10.5)
nRBC: 0 % (ref 0.0–0.2)

## 2020-09-03 LAB — COMPREHENSIVE METABOLIC PANEL
ALT: 35 U/L (ref 0–44)
AST: 20 U/L (ref 15–41)
Albumin: 3 g/dL — ABNORMAL LOW (ref 3.5–5.0)
Alkaline Phosphatase: 98 U/L (ref 38–126)
Anion gap: 7 (ref 5–15)
BUN: 12 mg/dL (ref 8–23)
CO2: 25 mmol/L (ref 22–32)
Calcium: 8.7 mg/dL — ABNORMAL LOW (ref 8.9–10.3)
Chloride: 100 mmol/L (ref 98–111)
Creatinine, Ser: 0.61 mg/dL (ref 0.61–1.24)
GFR, Estimated: >60 mL/min (ref >60)
Glucose, Bld: 115 mg/dL — ABNORMAL HIGH (ref 70–99)
Potassium: 4.1 mmol/L (ref 3.5–5.1)
Sodium: 132 mmol/L — ABNORMAL LOW (ref 135–145)
Total Bilirubin: 1.1 mg/dL (ref 0.3–1.2)
Total Protein: 9 g/dL — ABNORMAL HIGH (ref 6.5–8.1)

## 2020-09-03 LAB — LACTIC ACID, PLASMA
Lactic Acid, Venous: 1.1 mmol/L (ref 0.5–1.9)
Lactic Acid, Venous: 1.2 mmol/L (ref 0.5–1.9)

## 2020-09-03 LAB — CBG MONITORING, ED: Glucose-Capillary: 144 mg/dL — ABNORMAL HIGH (ref 70–99)

## 2020-09-03 LAB — RESP PANEL BY RT-PCR (FLU A&B, COVID) ARPGX2
Influenza A by PCR: NEGATIVE
Influenza B by PCR: NEGATIVE
SARS Coronavirus 2 by RT PCR: NEGATIVE

## 2020-09-03 LAB — PROTIME-INR
INR: 1.2 (ref 0.8–1.2)
Prothrombin Time: 14.7 seconds (ref 11.4–15.2)

## 2020-09-03 LAB — GLUCOSE, CAPILLARY: Glucose-Capillary: 292 mg/dL — ABNORMAL HIGH (ref 70–99)

## 2020-09-03 LAB — APTT: aPTT: 34 seconds (ref 24–36)

## 2020-09-03 MED ORDER — SODIUM CHLORIDE 0.9 % IV SOLN
2.0000 g | INTRAVENOUS | Status: DC
  Administered 2020-09-03 – 2020-09-05 (×3): 2 g via INTRAVENOUS
  Filled 2020-09-03 (×3): qty 20

## 2020-09-03 MED ORDER — INSULIN DETEMIR 100 UNIT/ML ~~LOC~~ SOLN
10.0000 [IU] | Freq: Every day | SUBCUTANEOUS | Status: DC
  Administered 2020-09-03 – 2020-09-05 (×3): 10 [IU] via SUBCUTANEOUS
  Filled 2020-09-03 (×5): qty 0.1

## 2020-09-03 MED ORDER — METHYLPREDNISOLONE SODIUM SUCC 125 MG IJ SOLR
60.0000 mg | Freq: Four times a day (QID) | INTRAMUSCULAR | Status: DC
  Administered 2020-09-03 – 2020-09-04 (×3): 60 mg via INTRAVENOUS
  Filled 2020-09-03 (×3): qty 2

## 2020-09-03 MED ORDER — ATORVASTATIN CALCIUM 40 MG PO TABS
40.0000 mg | ORAL_TABLET | Freq: Every day | ORAL | Status: DC
  Administered 2020-09-04 – 2020-09-06 (×3): 40 mg via ORAL
  Filled 2020-09-03 (×3): qty 1

## 2020-09-03 MED ORDER — AZITHROMYCIN 250 MG PO TABS
500.0000 mg | ORAL_TABLET | Freq: Every day | ORAL | Status: AC
  Administered 2020-09-04 – 2020-09-06 (×4): 500 mg via ORAL
  Filled 2020-09-03 (×4): qty 2

## 2020-09-03 MED ORDER — ASPIRIN EC 81 MG PO TBEC
81.0000 mg | DELAYED_RELEASE_TABLET | Freq: Every day | ORAL | Status: DC
  Administered 2020-09-04 – 2020-09-06 (×3): 81 mg via ORAL
  Filled 2020-09-03 (×3): qty 1

## 2020-09-03 MED ORDER — ALBUTEROL (5 MG/ML) CONTINUOUS INHALATION SOLN
10.0000 mg/h | INHALATION_SOLUTION | RESPIRATORY_TRACT | Status: AC
  Filled 2020-09-03: qty 20

## 2020-09-03 MED ORDER — PREDNISONE 20 MG PO TABS: 40.0000 mg | ORAL_TABLET | Freq: Every day | ORAL | Status: DC

## 2020-09-03 MED ORDER — ALBUTEROL SULFATE (2.5 MG/3ML) 0.083% IN NEBU
2.5000 mg | INHALATION_SOLUTION | RESPIRATORY_TRACT | Status: DC | PRN
Start: 2020-09-03 — End: 2020-09-06

## 2020-09-03 MED ORDER — FLUTICASONE FUROATE-VILANTEROL 200-25 MCG/INH IN AEPB: 1.0000 | INHALATION_SPRAY | Freq: Every day | RESPIRATORY_TRACT | Status: DC

## 2020-09-03 MED ORDER — FLUTICASONE FUROATE-VILANTEROL 200-25 MCG/INH IN AEPB
1.0000 | INHALATION_SPRAY | Freq: Every day | RESPIRATORY_TRACT | Status: DC
  Administered 2020-09-04 – 2020-09-06 (×3): 1 via RESPIRATORY_TRACT
  Filled 2020-09-03: qty 28

## 2020-09-03 MED ORDER — METHYLPREDNISOLONE SODIUM SUCC 125 MG IJ SOLR
125.0000 mg | Freq: Once | INTRAMUSCULAR | Status: AC
  Administered 2020-09-03: 125 mg via INTRAVENOUS
  Filled 2020-09-03: qty 2

## 2020-09-03 MED ORDER — ALBUTEROL (5 MG/ML) CONTINUOUS INHALATION SOLN
10.0000 mg/h | INHALATION_SOLUTION | RESPIRATORY_TRACT | Status: AC
  Administered 2020-09-03: 10 mg/h via RESPIRATORY_TRACT

## 2020-09-03 MED ORDER — IPRATROPIUM-ALBUTEROL 0.5-2.5 (3) MG/3ML IN SOLN
3.0000 mL | Freq: Four times a day (QID) | RESPIRATORY_TRACT | Status: DC
  Administered 2020-09-03 – 2020-09-06 (×11): 3 mL via RESPIRATORY_TRACT
  Filled 2020-09-03 (×11): qty 3

## 2020-09-03 MED ORDER — INSULIN ASPART 100 UNIT/ML ~~LOC~~ SOLN
0.0000 [IU] | Freq: Every day | SUBCUTANEOUS | Status: DC
  Administered 2020-09-03: 3 [IU] via SUBCUTANEOUS
  Administered 2020-09-04: 4 [IU] via SUBCUTANEOUS
  Administered 2020-09-05: 2 [IU] via SUBCUTANEOUS

## 2020-09-03 MED ORDER — AZITHROMYCIN 250 MG PO TABS
500.0000 mg | ORAL_TABLET | Freq: Every day | ORAL | Status: AC
  Administered 2020-09-03: 500 mg via ORAL
  Filled 2020-09-03: qty 2

## 2020-09-03 MED ORDER — INSULIN ASPART 100 UNIT/ML ~~LOC~~ SOLN
6.0000 [IU] | Freq: Three times a day (TID) | SUBCUTANEOUS | Status: DC
  Administered 2020-09-04 – 2020-09-06 (×7): 6 [IU] via SUBCUTANEOUS

## 2020-09-03 MED ORDER — POTASSIUM CHLORIDE CRYS ER 10 MEQ PO TBCR
10.0000 meq | EXTENDED_RELEASE_TABLET | Freq: Every day | ORAL | Status: DC
  Administered 2020-09-04 – 2020-09-06 (×3): 10 meq via ORAL
  Filled 2020-09-03 (×3): qty 1

## 2020-09-03 MED ORDER — ONDANSETRON HCL 4 MG PO TABS: 4.0000 mg | ORAL_TABLET | Freq: Four times a day (QID) | ORAL | Status: DC | PRN

## 2020-09-03 MED ORDER — PANTOPRAZOLE SODIUM 40 MG PO TBEC
40.0000 mg | DELAYED_RELEASE_TABLET | Freq: Every day | ORAL | Status: DC
  Administered 2020-09-04 – 2020-09-06 (×3): 40 mg via ORAL
  Filled 2020-09-03 (×3): qty 1

## 2020-09-03 MED ORDER — THIAMINE HCL 100 MG PO TABS
100.0000 mg | ORAL_TABLET | Freq: Every day | ORAL | Status: DC
  Administered 2020-09-04 – 2020-09-06 (×3): 100 mg via ORAL
  Filled 2020-09-03 (×4): qty 1

## 2020-09-03 MED ORDER — ALPRAZOLAM 0.25 MG PO TABS
0.2500 mg | ORAL_TABLET | Freq: Two times a day (BID) | ORAL | Status: DC | PRN
  Administered 2020-09-03 – 2020-09-06 (×6): 0.25 mg via ORAL
  Filled 2020-09-03 (×6): qty 1

## 2020-09-03 MED ORDER — SERTRALINE HCL 50 MG PO TABS
50.0000 mg | ORAL_TABLET | Freq: Every day | ORAL | Status: DC
  Administered 2020-09-04 – 2020-09-06 (×3): 50 mg via ORAL
  Filled 2020-09-03 (×3): qty 1

## 2020-09-03 MED ORDER — SODIUM CHLORIDE 0.9 % IV SOLN: INTRAVENOUS | Status: DC

## 2020-09-03 MED ORDER — GABAPENTIN 300 MG PO CAPS
300.0000 mg | ORAL_CAPSULE | Freq: Every day | ORAL | Status: DC
  Administered 2020-09-03 – 2020-09-05 (×3): 300 mg via ORAL
  Filled 2020-09-03 (×3): qty 1

## 2020-09-03 MED ORDER — INSULIN ASPART 100 UNIT/ML ~~LOC~~ SOLN
0.0000 [IU] | Freq: Three times a day (TID) | SUBCUTANEOUS | Status: DC
  Administered 2020-09-04 (×2): 4 [IU] via SUBCUTANEOUS
  Administered 2020-09-04: 7 [IU] via SUBCUTANEOUS
  Administered 2020-09-05 (×3): 4 [IU] via SUBCUTANEOUS
  Administered 2020-09-06: 3 [IU] via SUBCUTANEOUS

## 2020-09-03 MED ORDER — EMPAGLIFLOZIN 25 MG PO TABS
25.0000 mg | ORAL_TABLET | Freq: Every evening | ORAL | Status: DC
  Administered 2020-09-04 – 2020-09-05 (×2): 25 mg via ORAL
  Filled 2020-09-03 (×4): qty 1

## 2020-09-03 MED ORDER — ENOXAPARIN SODIUM 40 MG/0.4ML ~~LOC~~ SOLN
40.0000 mg | SUBCUTANEOUS | Status: DC
  Administered 2020-09-03 – 2020-09-05 (×3): 40 mg via SUBCUTANEOUS
  Filled 2020-09-03 (×3): qty 0.4

## 2020-09-03 MED ORDER — ONDANSETRON HCL 4 MG/2ML IJ SOLN: 4.0000 mg | Freq: Four times a day (QID) | INTRAMUSCULAR | Status: DC | PRN

## 2020-09-03 MED ORDER — SODIUM CHLORIDE 0.9 % IV SOLN
500.0000 mg | INTRAVENOUS | Status: AC
  Administered 2020-09-03: 500 mg via INTRAVENOUS
  Filled 2020-09-03: qty 500

## 2020-09-03 MED ORDER — TAMSULOSIN HCL 0.4 MG PO CAPS
0.4000 mg | ORAL_CAPSULE | Freq: Every day | ORAL | Status: DC
  Administered 2020-09-04 – 2020-09-06 (×3): 0.4 mg via ORAL
  Filled 2020-09-03 (×4): qty 1

## 2020-09-03 NOTE — ED Provider Notes (Signed)
[facility] EMERGENCY DEPARTMENT Provider Note   CSN: [id number] Arrival date & time: 09/03/20  1455     History Chief Complaint  Patient presents with  . Shortness of Breath    [name] is a 65 y.o. male.  HPI   This patient is a very ill-appearing 65 year old male.  Unfortunately he has severe COPD and is on 3 L of nasal cannula oxygen chronically.  My review of the medical record shows that this patient had been admitted to the hospital on February 10, he stayed until February 15 for what appeared to be COPD exacerbation pneumonia and severe sepsis if not septic shock.  He was on vasopressors in the ICU, he was successfully weaned off and discharged on an additional 4 days of Augmentin.  Over the last 10 days the patient has done overall very well however he in the last 24 hours has increasingly developed shortness of breath, cough, green mucus which she is coughing up and a tightness in his chest which is not relieved with using his albuterol treatments at home.  He had a home health nurse practitioner that made a visit to the house today, started him again on an antibiotic and on steroids, he is not aware of the names.  They then called him back and told him to come to the hospital because of persistent hypoxia.  On arrival the patient's oxygen level was 88% on 3 L, he is dyspneic and speaking in 2-3 word sentences using accessory muscles.  Past Medical History:  Diagnosis Date  . Anxiety   . Asthma   . Chest pain    a. 2003 Cath: nl cors.  [name] COPD (chronic obstructive pulmonary disease) ([place])   . Depression   . Diabetes mellitus   . Dyslipidemia   . Dysrhythmia   . Fracture 12/15   lower back "L-1"  . GERD (gastroesophageal reflux disease)   . Hypertension   . NASH (nonalcoholic steatohepatitis) 2018   [name], undergoing routine cirrhosis care  . PONV (postoperative nausea and vomiting)   . Splenomegaly     Patient Active Problem List   Diagnosis Date Noted   . Septic shock ([place]) 08/18/2020  . Lobar pneumonia ([place]) 05/25/2020  . Community acquired pneumonia of left lower lobe of lung 05/23/2020  . Acute on chronic respiratory failure with hypoxia ([place]) 05/23/2020  . Chronic respiratory failure with hypoxia ([place]) 02/04/2020  . Upper airway cough syndrome 12/10/2019  . Hives 07/30/2018  . Full body hives 07/29/2018  . Idiopathic angioedema 07/29/2018  . COPD GOLD III 07/29/2018  . Mycobacterium avium infection ([place]) 12/18/2017  . Goiter 10/25/2017  . Suspected Mycobacterial infectious disease 10/25/2017  . COPD with acute exacerbation ([place]) 10/19/2017  . Dehydration 10/19/2017  . NASH (nonalcoholic steatohepatitis) 08/05/2017  . Liver fibrosis 01/31/2017  . Hx of adenomatous colonic polyps 07/05/2016  . Hyperproteinemia 09/05/2014  . HTN (hypertension) 10/14/2013  . Dyslipidemia 10/14/2013  . Type 2 diabetes mellitus without complication, without long-term current use of insulin ([place]) 10/14/2013  . Leukopenia 08/08/2013  . Thrombocytopenia ([place]) 08/08/2013  . Lower abdominal pain 07/06/2013  . Abnormal LFTs 07/06/2013  . GERD (gastroesophageal reflux disease) 07/06/2013  . Unspecified constipation 07/06/2013  . Abdominal bloating 04/21/2013  . Abdominal pain, generalized 04/21/2013  . Non-alcoholic fatty liver disease 04/21/2013  . Splenomegaly 04/21/2013  . Lumbago 11/26/2011  . Muscle weakness (generalized) 11/26/2011  . Bilateral leg pain 11/06/2011  . Back pain with radiation 11/06/2011  .  DDD (degenerative disc disease), lumbar 11/06/2011    Past Surgical History:  Procedure Laterality Date  . BIOPSY  07/26/2016   Procedure: BIOPSY;  Surgeon: [name], MD;  Location: [facility] ENDO SU[facility];  Service: Endoscopy;;  gastric  . CARDIAC CATHETERIZATION  10/22/2001   normal coronary arteriea (Dr. Domenic Moras)  . COLONOSCOPY  08/31/2011   ULA:GTXMIWOE rectal and colon polyps-treated. Single tubular adenoma. Next TCS 08/2016  .  COLONOSCOPY N/A 07/26/2016   Dr. [name]: diverticulosis in sigmoid and descending colon. 6 mm benign splenic flexure. surveillance 5 yeras  . ESOPHAGOGASTRODUODENOSCOPY N/A 07/26/2016   Dr. [name]: normal esophagus, portal gastropathy, chronic gastritis, normal duodenum, screening EGD 2 years   . ESOPHAGOGASTRODUODENOSCOPY (EGD) WITH ESOPHAGEAL DILATION N/A 05/15/2013   HOZ:[id number] esophagus-status post Venia Minks dilation/Portal gastropathy. Antral erosions-status post biopsy. extrinsic compression along lesser curvature likely secondary to splenomegaly. gastric bx benign  . HERNIA REPAIR  03/2009  . POLYPECTOMY  07/26/2016   Procedure: POLYPECTOMY;  Surgeon: [name], MD;  Location: [facility];  Service: Endoscopy;;  colon  . TRANSTHORACIC ECHOCARDIOGRAM  2011   EF 00-37%, stage 1 diastolic dysfunction, trace TR & pulm valve regurg   . VASECTOMY  1989       Family History  Problem Relation Age of Onset  . Heart disease Mother   . Cancer Mother        lymph nodes  . Diabetes Mother   . Arthritis Other   . Lung disease Other   . Asthma Other   . Kidney disease Other   . Ovarian cancer Sister   . Diabetes Brother   . Heart disease Brother   . Hypertension Brother   . Diabetes Sister   . Diabetes Sister   . Cirrhosis Sister 57       no etoh  . Colon cancer Neg Hx     Social History   Tobacco Use  . Smoking status: Former Smoker    Packs/day: 1.25    Years: 36.00    Pack years: 45.00    Types: Cigarettes    Quit date: 04/2011    Years since quitting: 9.4  . Smokeless tobacco: Former Network engineer  . Vaping Use: Never used  Substance Use Topics  . Alcohol use: No  . Drug use: No    Home Medications Prior to Admission medications   Medication Sig Start Date End Date Taking? Authorizing Provider  acetaminophen (TYLENOL) 500 MG tablet Take 1,000 mg by mouth daily as needed for headache.    Yes [provider]  albuterol (PROVENTIL) (2.5 MG/3ML) 0.083%  nebulizer solution Take 3 mLs (2.5 mg total) by nebulization every 2 (two) hours as needed for wheezing or shortness of breath (cough). 06/13/20  Yes Emokpae, Courage, MD  ALPRAZolam [name]) 0.25 MG tablet Take 0.25 mg by mouth 2 (two) times daily as needed for sleep.    Yes [provider]  aspirin EC 81 MG tablet Take 1 tablet (81 mg total) by mouth daily with breakfast. 06/13/20  Yes Emokpae, Courage, MD  atorvastatin (LIPITOR) 40 MG tablet Take 1 tablet (40 mg total) by mouth daily. 06/13/20  Yes Emokpae, Courage, MD  bisoprolol (ZEBETA) 5 MG tablet Take 1 tablet (5 mg total) by mouth daily. 08/23/20 09/22/20 Yes Shah, Pratik D, DO  budesonide-formoterol (SYMBICORT) 160-4.5 MCG/ACT inhaler Inhale 2 puffs into the lungs 2 (two) times daily. 06/13/20  Yes Emokpae, Courage, MD  Cholecalciferol (VITAMIN D3) 25 MCG (1000 UT)  CAPS Take 1 capsule by mouth daily.   Yes [provider]  [employer] VITAMIN C 250 MG tablet Take 250 mg by mouth daily. 06/13/20  Yes [provider]  furosemide (LASIX) 20 MG tablet Take 20 mg by mouth daily.   Yes [provider]  gabapentin (NEURONTIN) 300 MG capsule Take 1 capsule (300 mg total) by mouth at bedtime. 06/13/20  Yes Roxan Hockey, MD  glimepiride (AMARYL) 2 MG tablet Take 1 tablet (2 mg total) by mouth daily with breakfast. Please do NOT take if you skip breakfast 06/13/20  Yes Emokpae, Courage, MD  guaiFENesin-dextromethorphan (ROBITUSSIN DM) 100-10 MG/5ML syrup Take 5 mLs by mouth every 4 (four) hours as needed for cough (chest congestion). 08/23/20  Yes Manuella Ghazi, Pratik D, DO  JARDIANCE 25 MG TABS tablet Take 25 mg by mouth every evening.  01/01/17  Yes [provider]  Multiple Vitamin (MULTIVITAMIN WITH MINERALS) TABS tablet Take 1 tablet by mouth at bedtime. 06/13/20  Yes Roxan Hockey, MD  [facility] DELICA LANCETS 68L MISC  08/26/18  Yes [provider]  [facility] VERIO test strip  08/26/18  Yes [provider]   OXYGEN Inhale 3 L into the lungs.   Yes [provider]  pantoprazole (PROTONIX) 40 MG tablet Take 1 tablet (40 mg total) by mouth daily. 07/14/20  Yes Aliene Altes S, PA-C  potassium chloride (MICRO-K) 10 MEQ CR capsule Take 10 mEq by mouth daily.   Yes [provider]  PROAIR HFA 108 (90 Base) MCG/ACT inhaler Inhale 2 puffs into the lungs every 4 (four) hours as needed for wheezing or shortness of breath. 06/13/20  Yes Emokpae, Courage, MD  sertraline (ZOLOFT) 50 MG tablet Take 1 tablet (50 mg total) by mouth daily. 06/13/20  Yes Emokpae, Courage, MD  SitaGLIPtin-MetFORMIN HCl [phone] MG TB24 Take 1 tablet by mouth daily.   Yes [provider]  sodium chloride (OCEAN) 0.65 % SOLN nasal spray Place 1 spray into both nostrils as needed for congestion. 08/23/20  Yes Manuella Ghazi, Pratik D, DO  tamsulosin (FLOMAX) 0.4 MG CAPS capsule Take 0.4 mg by mouth daily.   Yes [provider]  thiamine 100 MG tablet Take 1 tablet (100 mg total) by mouth daily. 06/14/20  Yes Emokpae, Courage, MD  guaiFENesin (MUCINEX) 600 MG 12 hr tablet Take 1 tablet (600 mg total) by mouth 2 (two) times daily. 06/13/20   Roxan Hockey, MD    Allergies    Baclofen and Simvastatin  Review of Systems   Review of Systems  All other systems reviewed and are negative.   Physical Exam Updated Vital Signs BP (!) 96/55 (BP Location: Left Arm)   Pulse 79   Temp 97.9 F (36.6 C)   Resp (!) 24   SpO2 94%   Physical Exam Vitals and nursing note reviewed.  Constitutional:      General: He is in acute distress.     Appearance: He is well-developed and well-nourished. He is ill-appearing.  HENT:     Head: Normocephalic and atraumatic.     Mouth/Throat:     Mouth: Oropharynx is clear and moist.     Pharynx: No oropharyngeal exudate.  Eyes:     General: No scleral icterus.       Right eye: No discharge.        Left eye: No discharge.     Extraocular Movements: EOM normal.      Conjunctiva/sclera: Conjunctivae normal.     Pupils: Pupils are  equal, round, and reactive to light.  Neck:     Thyroid: No thyromegaly.     Vascular: No JVD.  Cardiovascular:     Rate and Rhythm: Regular rhythm. Tachycardia present.     Pulses: Intact distal pulses.     Heart sounds: Normal heart sounds. No murmur heard. No friction rub. No gallop.      Comments: Borderline tachycardia, normal pulses at the radial arteries Pulmonary:     Effort: Tachypnea, accessory muscle usage and respiratory distress present.     Breath sounds: Wheezing and rales present.     Comments: Rales at the bases bilaterally Abdominal:     General: Bowel sounds are normal. There is no distension.     Palpations: Abdomen is soft. There is no mass.     Tenderness: There is no abdominal tenderness.     Comments: Small umbilical hernia, reducible and nontender  Musculoskeletal:        General: No tenderness or edema. Normal range of motion.     Cervical back: Normal range of motion and neck supple.     Right lower leg: No edema.     Left lower leg: No edema.  Lymphadenopathy:     Cervical: No cervical adenopathy.  Skin:    General: Skin is warm and dry.     Findings: No erythema or rash.  Neurological:     Mental Status: He is alert.     Coordination: Coordination normal.     Comments: The patient is able to get out of the chair and get onto the bed but with some great discomfort due to weakness and shortness of breath  Psychiatric:        Mood and Affect: Mood and affect normal.        Behavior: Behavior normal.     ED Results / Procedures / Treatments   Labs (all labs ordered are listed, but only abnormal results are displayed) Labs Reviewed  COMPREHENSIVE METABOLIC PANEL - Abnormal; Notable for the following components:      Result Value   Sodium 132 (*)    Glucose, Bld 115 (*)    Calcium 8.7 (*)    Total Protein 9.0 (*)    Albumin 3.0 (*)    All other components within normal limits   CBC WITH DIFFERENTIAL/PLATELET - Abnormal; Notable for the following components:   Hemoglobin 12.9 (*)    MCH 25.6 (*)    RDW 15.9 (*)    Neutro Abs 9.1 (*)    Lymphs Abs 0.5 (*)    All other components within normal limits  URINALYSIS, ROUTINE W REFLEX MICROSCOPIC - Abnormal; Notable for the following components:   Glucose, UA >=500 (*)    Protein, ur 30 (*)    Bacteria, UA RARE (*)    All other components within normal limits  RESP PANEL BY RT-PCR (FLU A&B, COVID) [id number]  CULTURE, BLOOD (ROUTINE X 2)  CULTURE, BLOOD (ROUTINE X 2)  URINE CULTURE  LACTIC ACID, PLASMA  PROTIME-INR  APTT  LACTIC ACID, PLASMA    EKG EKG Interpretation  Date/Time:  Saturday September 03 2020 16:11:04 EST Ventricular Rate:  80 PR Interval:    QRS Duration: 92 QT Interval:  406 QTC Calculation: 469 R Axis:   85 Text Interpretation: Sinus rhythm Borderline right axis deviation Since last tracing rate slower Confirmed by Noemi Chapel [phone]) on 09/03/2020 4:23:34 PM   Radiology DG Chest Port 1 View  Result Date: 09/03/2020 CLINICAL DATA:  Questionable sepsis.  History of COPD. EXAM: PORTABLE CHEST 1 VIEW COMPARISON:  August 22, 2020 FINDINGS: Cardiomediastinal silhouette is normal. Mediastinal contours appear intact. Improved aeration of the left lung with residual less prominent patchy airspace consolidation in the mid lung field. Milder peribronchial airspace consolidation throughout the right lung. No evidence of pleural effusions. Osseous structures are without acute abnormality. Soft tissues are grossly normal. IMPRESSION: 1. Improved aeration of the left lung with residual less prominent patchy airspace consolidation in the mid lung field. 2. Milder peribronchial airspace consolidation throughout the right lung. Electronically Signed   By: Fidela Salisbury M.D.   On: 09/03/2020 15:52    Procedures .Critical Care Performed by: Noemi Chapel, MD Authorized by: Noemi Chapel, MD    Critical care provider statement:    Critical care time (minutes):  35   Critical care time was exclusive of:  Separately billable procedures and treating other patients and teaching time   Critical care was necessary to treat or prevent imminent or life-threatening deterioration of the following conditions:  Respiratory failure   Critical care was time spent personally by me on the following activities:  Blood draw for specimens, development of treatment plan with patient or surrogate, discussions with consultants, evaluation of patient's response to treatment, examination of patient, obtaining history from patient or surrogate, ordering and performing treatments and interventions, ordering and review of laboratory studies, ordering and review of radiographic studies, pulse oximetry, re-evaluation of patient's condition and review of old charts Comments:           Medications Ordered in ED Medications  0.9 %  sodium chloride infusion ( Intravenous New Bag/Given 09/03/20 1605)  cefTRIAXone (ROCEPHIN) 2 g in sodium chloride 0.9 % 100 mL IVPB (0 g Intravenous Stopped 09/03/20 1700)  albuterol (PROVENTIL,VENTOLIN) solution continuous neb (10 mg/hr Nebulization Not Given 09/03/20 1609)  albuterol (PROVENTIL,VENTOLIN) solution continuous neb (10 mg/hr Nebulization New Bag/Given 09/03/20 1725)  azithromycin (ZITHROMAX) tablet 500 mg (500 mg Oral Given 09/03/20 1552)  methylPREDNISolone sodium succinate (SOLU-MEDROL) 125 mg/2 mL injection 125 mg (125 mg Intravenous Given 09/03/20 1552)    ED Course  I have reviewed the triage vital signs and the nursing notes.  Pertinent labs & imaging results that were available during my care of the patient were reviewed by me and considered in my medical decision making (see chart for details).  Clinical Course as of 09/03/20 1728  Sat Sep 03, 2020  1728 Culture: PENDING [BM]    Clinical Course User Index [BM] Noemi Chapel, MD   MDM  Rules/Calculators/A&P                          This patient is ill-appearing, he is critically ill with what appears to be acute hypoxic respiratory failure.  His respiratory rate is 28, he is afebrile, his blood pressure is 102/50 and oxygen level on 5 L is 92%.  We will obtain a chest x-ray, code sepsis activated, possibly recurrent pneumonia, will test for Covid, the patient would need to be admitted to high level of care.  Steroids Continuous nebulizer therapy Antibiotics  I have noted on the chest x-ray which I have personally viewed and interpreted, this is an anterior posterior portable chest x-ray.  There does appear to be a slight effusion on the left with an associated infiltrate in the left midlung.  This is similar to prior x-ray, the infiltrate is not quite as extensive.  There is some atelectasis  in the right lung, mediastinum appears normal, trachea midline, diaphragm normal, skin and soft tissues unremarkable.  Lab work shows no leukocytosis, metabolic panel unremarkable, lactic acid is normal at just over 1, minimal hyponatremia   Covid test is negative   Due to the patient's level of respiratory distress continuous nebulizer therapy was given, he has improved but still requiring 5 L by nasal cannula. Due to his acute hypoxic respiratory failure he will be admitted to the hospital. Discussed with Dr. Nehemiah Settle who will admit      Final Clinical Impression(s) / ED Diagnoses Final diagnoses:  Acute respiratory distress  COPD exacerbation ([place])  Community acquired pneumonia of left upper lobe of lung      Noemi Chapel, MD 09/03/20 1728

## 2020-09-03 NOTE — H&P (Signed)
History and Physical  [name] DOB: August 17, 1955 DOA: 09/03/2020  Referring physician: Dr Sabra Heck, ED physician PCP: Asencion Noble, MD  Outpatient Specialists:   Patient Coming From: home  Chief Complaint: SOB  HPI: [name] is a 65 y.o. male with a history of COPD, diabetes, hypertension, GERD, [name].  Patient seen for worsening shortness of breath since yesterday evening.  Worse with exertion and improved with rest.  Does have wheezing and coughing.  He was recently diagnosed with pneumonia and septic shock and was recently released from the hospital.  He has been on Augmentin for antibiotics.  No other palliating or provoking factors.  Does have sputum production with a lot of coughing.  Describes as greenish and thick.  Has been compliant with inhalers.  Emergency Department Course: Hypoxic to 87% on 3 L.  Improved to mid 90s on 4 to 5 L.  Checks x-ray shows improving infiltrates.  Patient started on Solu-Medrol, given nebulizer treatments.  Somewhat improved since presentation  Review of Systems:   Pt denies any fevers, chills, nausea, vomiting, diarrhea, constipation, abdominal pain, palpitations, headache, vision changes, lightheadedness, dizziness, melena, rectal bleeding.  Review of systems are otherwise negative  Past Medical History:  Diagnosis Date  . Anxiety   . Asthma   . Chest pain    a. 2003 Cath: nl cors.  [name] COPD (chronic obstructive pulmonary disease) ([place])   . Depression   . Diabetes mellitus   . Dyslipidemia   . Dysrhythmia   . Fracture 12/15   lower back "L-1"  . GERD (gastroesophageal reflux disease)   . Hypertension   . NASH (nonalcoholic steatohepatitis) 2018   [name], undergoing routine cirrhosis care  . PONV (postoperative nausea and vomiting)   . Splenomegaly    Past Surgical History:  Procedure Laterality Date  . BIOPSY  07/26/2016   Procedure: BIOPSY;  Surgeon: [name], MD;  [facility];  Service:  Endoscopy;;  gastric  . CARDIAC CATHETERIZATION  10/22/2001   normal coronary arteriea (Dr. Domenic Moras)  . COLONOSCOPY  08/31/2011   WVP:XTGGYIRS rectal and colon polyps-treated. Single tubular adenoma. Next TCS 08/2016  . COLONOSCOPY N/A 07/26/2016   Dr. [name]: diverticulosis in sigmoid and descending colon. 6 mm benign splenic flexure. surveillance 5 yeras  . ESOPHAGOGASTRODUODENOSCOPY N/A 07/26/2016   Dr. [name]: normal esophagus, portal gastropathy, chronic gastritis, normal duodenum, screening EGD 2 years   . ESOPHAGOGASTRODUODENOSCOPY (EGD) WITH ESOPHAGEAL DILATION N/A 05/15/2013   WNI:OEVOJJ esophagus-status post Venia Minks dilation/Portal gastropathy. Antral erosions-status post biopsy. extrinsic compression along lesser curvature likely secondary to splenomegaly. gastric bx benign  . HERNIA REPAIR  03/2009  . POLYPECTOMY  07/26/2016   Procedure: POLYPECTOMY;  Surgeon: [name], MD;  Location: [facility];  Service: Endoscopy;;  colon  . TRANSTHORACIC ECHOCARDIOGRAM  2011   EF 00-93%, stage 1 diastolic dysfunction, trace TR & pulm valve regurg   . VASECTOMY  1989   Social History:  reports that he quit smoking about 9 years ago. His smoking use included cigarettes. He has a 45.00 pack-year smoking history. He has quit using smokeless tobacco. He reports that he does not drink alcohol and does not use drugs. Patient lives at home  Allergies  Allergen Reactions  . Baclofen Other (See Comments)    lethargic   . Simvastatin Nausea Only    Family History  Problem Relation Age of Onset  . Heart disease Mother   . Cancer Mother  lymph nodes  . Diabetes Mother   . Arthritis Other   . Lung disease Other   . Asthma Other   . Kidney disease Other   . Ovarian cancer Sister   . Diabetes Brother   . Heart disease Brother   . Hypertension Brother   . Diabetes Sister   . Diabetes Sister   . Cirrhosis Sister 61       no etoh  . Colon cancer Neg Hx       Prior to Admission  medications   Medication Sig Start Date End Date Taking? Authorizing Provider  acetaminophen (TYLENOL) 500 MG tablet Take 1,000 mg by mouth daily as needed for headache.    Yes [provider]  albuterol (PROVENTIL) (2.5 MG/3ML) 0.083% nebulizer solution Take 3 mLs (2.5 mg total) by nebulization every 2 (two) hours as needed for wheezing or shortness of breath (cough). 06/13/20  Yes Emokpae, Courage, MD  ALPRAZolam [name]) 0.25 MG tablet Take 0.25 mg by mouth 2 (two) times daily as needed for sleep.    Yes [provider]  aspirin EC 81 MG tablet Take 1 tablet (81 mg total) by mouth daily with breakfast. 06/13/20  Yes Emokpae, Courage, MD  atorvastatin (LIPITOR) 40 MG tablet Take 1 tablet (40 mg total) by mouth daily. 06/13/20  Yes Emokpae, Courage, MD  bisoprolol (ZEBETA) 5 MG tablet Take 1 tablet (5 mg total) by mouth daily. 08/23/20 09/22/20 Yes Shah, Pratik D, DO  budesonide-formoterol (SYMBICORT) 160-4.5 MCG/ACT inhaler Inhale 2 puffs into the lungs 2 (two) times daily. 06/13/20  Yes Emokpae, Courage, MD  Cholecalciferol (VITAMIN D3) 25 MCG (1000 UT) CAPS Take 1 capsule by mouth daily.   Yes [provider]  [facility] VITAMIN C 250 MG tablet Take 250 mg by mouth daily. 06/13/20  Yes [provider]  furosemide (LASIX) 20 MG tablet Take 20 mg by mouth daily.   Yes [provider]  gabapentin (NEURONTIN) 300 MG capsule Take 1 capsule (300 mg total) by mouth at bedtime. 06/13/20  Yes Roxan Hockey, MD  glimepiride (AMARYL) 2 MG tablet Take 1 tablet (2 mg total) by mouth daily with breakfast. Please do NOT take if you skip breakfast 06/13/20  Yes Emokpae, Courage, MD  guaiFENesin-dextromethorphan (ROBITUSSIN DM) 100-10 MG/5ML syrup Take 5 mLs by mouth every 4 (four) hours as needed for cough (chest congestion). 08/23/20  Yes Manuella Ghazi, Pratik D, DO  JARDIANCE 25 MG TABS tablet Take 25 mg by mouth every evening.  01/01/17  Yes [provider]  Multiple Vitamin  (MULTIVITAMIN WITH MINERALS) TABS tablet Take 1 tablet by mouth at bedtime. 06/13/20  Yes Roxan Hockey, MD  [facility] DELICA LANCETS 78H MISC  08/26/18  Yes [provider]  [facility] VERIO test strip  08/26/18  Yes [provider]  OXYGEN Inhale 3 L into the lungs.   Yes [provider]  pantoprazole (PROTONIX) 40 MG tablet Take 1 tablet (40 mg total) by mouth daily. 07/14/20  Yes Aliene Altes S, PA-C  potassium chloride (MICRO-K) 10 MEQ CR capsule Take 10 mEq by mouth daily.   Yes [provider]  PROAIR HFA 108 (90 Base) MCG/ACT inhaler Inhale 2 puffs into the lungs every 4 (four) hours as needed for wheezing or shortness of breath. 06/13/20  Yes Emokpae, Courage, MD  sertraline (ZOLOFT) 50 MG tablet Take 1 tablet (50 mg total) by mouth daily. 06/13/20  Yes Emokpae, Courage, MD  SitaGLIPtin-MetFORMIN HCl [id number] MG TB24 Take 1 tablet by  mouth daily.   Yes [provider]  sodium chloride (OCEAN) 0.65 % SOLN nasal spray Place 1 spray into both nostrils as needed for congestion. 08/23/20  Yes Manuella Ghazi, Pratik D, DO  tamsulosin (FLOMAX) 0.4 MG CAPS capsule Take 0.4 mg by mouth daily.   Yes [provider]  thiamine 100 MG tablet Take 1 tablet (100 mg total) by mouth daily. 06/14/20  Yes Emokpae, Courage, MD  guaiFENesin (MUCINEX) 600 MG 12 hr tablet Take 1 tablet (600 mg total) by mouth 2 (two) times daily. 06/13/20   Roxan Hockey, MD    Physical Exam: BP (!) 100/49   Pulse 95   Temp 97.9 F (36.6 C)   Resp (!) 24   SpO2 96%   . General: Elderly male. Awake and alert and oriented x3. No acute cardiopulmonary distress.  [name] HEENT: Normocephalic atraumatic.  Right and left ears normal in appearance.  Pupils equal, round, reactive to light. Extraocular muscles are intact. Sclerae anicteric and noninjected.  Moist mucosal membranes. No mucosal lesions.  . Neck: Neck supple without lymphadenopathy. No carotid bruits. No masses palpated.   . Cardiovascular: Regular rate with normal S1-S2 sounds. No murmurs, rubs, gallops auscultated. No JVD.  [name] Respiratory: Rales bilaterally.  Expiratory wheezes.  No accessory muscle use. . Abdomen: Soft, nontender, nondistended. Active bowel sounds. No masses or hepatosplenomegaly  . Skin: No rashes, lesions, or ulcerations.  Dry, warm to touch. 2+ dorsalis pedis and radial pulses. . Musculoskeletal: No calf or leg pain. All major joints not erythematous nontender.  No upper or lower joint deformation.  Good ROM.  No contractures  . Psychiatric: Intact judgment and insight. Pleasant and cooperative. . Neurologic: No focal neurological deficits. Strength is 5/5 and symmetric in upper and lower extremities.  Cranial nerves II through XII are grossly intact.           Labs on Admission: I have personally reviewed following labs and imaging studies  CBC: Recent Labs  Lab 09/03/20 1535  WBC 10.0  NEUTROABS 9.1*  HGB 12.9*  HCT 42.1  MCV 83.7  PLT 270   Basic Metabolic Panel: Recent Labs  Lab 09/03/20 1535  NA 132*  K 4.1  CL 100  CO2 25  GLUCOSE 115*  BUN 12  CREATININE 0.61  CALCIUM 8.7*   GFR: CrCl cannot be calculated (Unknown ideal weight.). Liver Function Tests: Recent Labs  Lab 09/03/20 1535  AST 20  ALT 35  ALKPHOS 98  BILITOT 1.1  PROT 9.0*  ALBUMIN 3.0*   No results for input(s): LIPASE, AMYLASE in the last 168 hours. No results for input(s): AMMONIA in the last 168 hours. Coagulation Profile: Recent Labs  Lab 09/03/20 1535  INR 1.2   Cardiac Enzymes: No results for input(s): CKTOTAL, CKMB, CKMBINDEX, TROPONINI in the last 168 hours. BNP (last 3 results) No results for input(s): PROBNP in the last 8760 hours. HbA1C: No results for input(s): HGBA1C in the last 72 hours. CBG: Recent Labs  Lab 09/03/20 1737  GLUCAP 144*   Lipid Profile: No results for input(s): CHOL, HDL, LDLCALC, TRIG, CHOLHDL, LDLDIRECT in the last 72 hours. Thyroid Function  Tests: No results for input(s): TSH, T4TOTAL, FREET4, T3FREE, THYROIDAB in the last 72 hours. Anemia Panel: No results for input(s): VITAMINB12, FOLATE, FERRITIN, TIBC, IRON, RETICCTPCT in the last 72 hours. Urine analysis:    Component Value Date/Time   COLORURINE YELLOW 09/03/2020 1636   APPEARANCEUR CLEAR 09/03/2020 1636   LABSPEC 1.022 09/03/2020 1636  PHURINE 5.0 09/03/2020 1636   GLUCOSEU >=500 (A) 09/03/2020 1636   HGBUR NEGATIVE 09/03/2020 1636   BILIRUBINUR NEGATIVE 09/03/2020 1636   KETONESUR NEGATIVE 09/03/2020 1636   PROTEINUR 30 (A) 09/03/2020 1636   NITRITE NEGATIVE 09/03/2020 1636   LEUKOCYTESUR NEGATIVE 09/03/2020 1636   Sepsis Labs: @LABRCNTIP (procalcitonin:4,lacticidven:4) ) Recent Results (from the past 240 hour(s))  Resp Panel by RT-PCR (Flu A&B, Covid) Nasopharyngeal Swab     Status: None   Collection Time: 09/03/20  3:34 PM   Specimen: Nasopharyngeal Swab; Nasopharyngeal(NP) swabs in vial transport medium  Result Value Ref Range Status   SARS Coronavirus 2 by RT PCR NEGATIVE NEGATIVE Final    Comment: (NOTE) SARS-CoV-2 target nucleic acids are NOT DETECTED.  The SARS-CoV-2 RNA is generally detectable in upper respiratory specimens during the acute phase of infection. The lowest concentration of SARS-CoV-2 viral copies this assay can detect is 138 copies/mL. A negative result does not preclude SARS-Cov-2 infection and should not be used as the sole basis for treatment or other patient management decisions. A negative result may occur with  improper specimen collection/handling, submission of specimen other than nasopharyngeal swab, presence of viral mutation(s) within the areas targeted by this assay, and inadequate number of viral copies(<138 copies/mL). A negative result must be combined with clinical observations, patient history, and epidemiological information. The expected result is Negative.  Fact Sheet for Patients:   EntrepreneurPulse.com.au  Fact Sheet for Healthcare Providers:  IncredibleEmployment.be  This test is no t yet approved or cleared by the [place] FDA and  has been authorized for detection and/or diagnosis of SARS-CoV-2 by FDA under an Emergency Use Authorization (EUA). This EUA will remain  in effect (meaning this test can be used) for the duration of the COVID-19 declaration under Section 564(b)(1) of the Act, 21 U.S.C.section 360bbb-3(b)(1), unless the authorization is terminated  or revoked sooner.       Influenza A by PCR NEGATIVE NEGATIVE Final   Influenza B by PCR NEGATIVE NEGATIVE Final    Comment: (NOTE) The Xpert Xpress SARS-CoV-2/FLU/RSV plus assay is intended as an aid in the diagnosis of influenza from Nasopharyngeal swab specimens and should not be used as a sole basis for treatment. Nasal washings and aspirates are unacceptable for Xpert Xpress SARS-CoV-2/FLU/RSV testing.  Fact Sheet for Patients: EntrepreneurPulse.com.au  Fact Sheet for Healthcare Providers: IncredibleEmployment.be  This test is not yet approved or cleared by the [place] FDA and has been authorized for detection and/or diagnosis of SARS-CoV-2 by FDA under an Emergency Use Authorization (EUA). This EUA will remain in effect (meaning this test can be used) for the duration of the COVID-19 declaration under Section 564(b)(1) of the Act, 21 U.S.C. section 360bbb-3(b)(1), unless the authorization is terminated or revoked.  Performed at [facility], [address] [place], [place] [address]   Blood Culture (routine x 2)     Status: None (Preliminary result)   Collection Time: 09/03/20  3:39 PM   Specimen: Blood  Result Value Ref Range Status   Specimen Description   Final    BLOOD RIGHT HAND BOTTLES DRAWN AEROBIC AND ANAEROBIC   Special Requests   Final    Blood Culture adequate volume Performed at [facility], [address] [place], [place] [address]    Culture PENDING  Incomplete   Report Status PENDING  Incomplete  Blood Culture (routine x 2)     Status: None (Preliminary result)   Collection Time: 09/03/20  3:39 PM   Specimen:  Blood  Result Value Ref Range Status   Specimen Description BLOOD LEFT ARM BOTTLES DRAWN AEROBIC AND ANAEROBIC  Final   Special Requests   Final    Blood Culture adequate volume Performed at [facility], [address] [place], [place] [address]    Culture PENDING  Incomplete   Report Status PENDING  Incomplete     Radiological Exams on Admission: DG Chest Port 1 View  Result Date: 09/03/2020 CLINICAL DATA:  Questionable sepsis.  History of COPD. EXAM: PORTABLE CHEST 1 VIEW COMPARISON:  August 22, 2020 FINDINGS: Cardiomediastinal silhouette is normal. Mediastinal contours appear intact. Improved aeration of the left lung with residual less prominent patchy airspace consolidation in the mid lung field. Milder peribronchial airspace consolidation throughout the right lung. No evidence of pleural effusions. Osseous structures are without acute abnormality. Soft tissues are grossly normal. IMPRESSION: 1. Improved aeration of the left lung with residual less prominent patchy airspace consolidation in the mid lung field. 2. Milder peribronchial airspace consolidation throughout the right lung. Electronically Signed   By: Fidela Salisbury M.D.   On: 09/03/2020 15:52    EKG: Independently reviewed.  Sinus rhythm.  Borderline right axis deviation.  No acute ST changes  Assessment/Plan: Active Problems:   Non-alcoholic fatty liver disease   GERD (gastroesophageal reflux disease)   HTN (hypertension)   Type 2 diabetes mellitus without complication, without long-term current use of insulin ([name])   COPD with acute exacerbation ([place])   Community acquired pneumonia of left lower lobe of lung   Acute on chronic respiratory failure ([place])    This patient was discussed  with the ED physician, including pertinent vitals, physical exam findings, labs, and imaging.  We also discussed care given by the ED provider.  1. Acute on chronic respiratory failure 2 acute COPD exacerbation a. Admit b. Continue oxygen Antibiotics: Rocephin and azithromycin DuoNeb's every 6 scheduled with albuterol every 2 when necessary Continue inhaled steroids and LA bronchodilator Solu-Medrol 60 mg IV every 6 hours Mucinex 2. Community-acquired pneumonia a. Improved infiltrates 3. Type 2 diabetes a. We will hold oral hypoglycemics b. Start Levemir c. Sliding scale insulin d. CBGs 4. Hypertension a. Blood pressure is a little bit low b. We will hold Bisoprolol and lasix 5. GERD a. Continue PPI 6. NASH  DVT prophylaxis: Lovenox Consultants: None Code Status: Full code Family Communication: Wife present during interview and exam Disposition Plan: Patient should be able to return home following hospitalization   Truett Mainland, DO

## 2020-09-03 NOTE — ED Triage Notes (Signed)
History of COPD, short of breath worse for the past 2 days

## 2020-09-04 DIAGNOSIS — J9621 Acute and chronic respiratory failure with hypoxia: Secondary | ICD-10-CM

## 2020-09-04 LAB — BASIC METABOLIC PANEL
Anion gap: 9 (ref 5–15)
BUN: 16 mg/dL (ref 8–23)
CO2: 26 mmol/L (ref 22–32)
Calcium: 8.8 mg/dL — ABNORMAL LOW (ref 8.9–10.3)
Chloride: 103 mmol/L (ref 98–111)
Creatinine, Ser: 0.57 mg/dL — ABNORMAL LOW (ref 0.61–1.24)
GFR, Estimated: >60 mL/min (ref >60)
Glucose, Bld: 153 mg/dL — ABNORMAL HIGH (ref 70–99)
Potassium: 4.3 mmol/L (ref 3.5–5.1)
Sodium: 138 mmol/L (ref 135–145)

## 2020-09-04 LAB — GLUCOSE, CAPILLARY
Glucose-Capillary: 168 mg/dL — ABNORMAL HIGH (ref 70–99)
Glucose-Capillary: 244 mg/dL — ABNORMAL HIGH (ref 70–99)
Glucose-Capillary: 189 mg/dL — ABNORMAL HIGH (ref 70–99)
Glucose-Capillary: 329 mg/dL — ABNORMAL HIGH (ref 70–99)

## 2020-09-04 LAB — PROCALCITONIN: Procalcitonin: 0.37 ng/mL

## 2020-09-04 LAB — CBC
HCT: 41.5 % (ref 39.0–52.0)
Hemoglobin: 12.8 g/dL — ABNORMAL LOW (ref 13.0–17.0)
MCH: 25.9 pg — ABNORMAL LOW (ref 26.0–34.0)
MCHC: 30.8 g/dL (ref 30.0–36.0)
MCV: 83.8 fL (ref 80.0–100.0)
Platelets: 198 10*3/uL (ref 150–400)
RBC: 4.95 MIL/uL (ref 4.22–5.81)
RDW: 15.1 % (ref 11.5–15.5)
WBC: 4 10*3/uL (ref 4.0–10.5)
nRBC: 0 % (ref 0.0–0.2)

## 2020-09-04 MED ORDER — METHYLPREDNISOLONE SODIUM SUCC 125 MG IJ SOLR
60.0000 mg | Freq: Two times a day (BID) | INTRAMUSCULAR | Status: DC
  Administered 2020-09-04 – 2020-09-05 (×2): 60 mg via INTRAVENOUS
  Filled 2020-09-04 (×2): qty 2

## 2020-09-04 MED ORDER — BUDESONIDE 0.25 MG/2ML IN SUSP
0.2500 mg | Freq: Two times a day (BID) | RESPIRATORY_TRACT | Status: DC
  Administered 2020-09-04 – 2020-09-06 (×4): 0.25 mg via RESPIRATORY_TRACT
  Filled 2020-09-04 (×4): qty 2

## 2020-09-04 MED ORDER — INSULIN ASPART 100 UNIT/ML ~~LOC~~ SOLN
SUBCUTANEOUS | Status: AC
  Filled 2020-09-04: qty 1

## 2020-09-04 NOTE — Progress Notes (Signed)
PROGRESS NOTE    [name]  QTM:[id number] DOB: [birth year] DOA: 09/03/2020 PCP: Asencion Noble, MD   Brief Narrative:  Per HPI: [name] is a 65 y.o. male with a history of COPD, diabetes, hypertension, GERD, [name].  Patient seen for worsening shortness of breath since yesterday evening.  Worse with exertion and improved with rest.  Does have wheezing and coughing.  He was recently diagnosed with pneumonia and septic shock and was recently released from the hospital.  He has been on Augmentin for antibiotics.  No other palliating or provoking factors.  Does have sputum production with a lot of coughing.  Describes as greenish and thick.  Has been compliant with inhalers.  -Patient was admitted with acute on chronic hypoxemic respiratory failure secondary to acute COPD exacerbation.  He was recently discharged on 2/15 after treatment for septic shock related to left lower lobe pneumonia and was doing well at home until the day before admission.  Assessment & Plan:   Active Problems:   Non-alcoholic fatty liver disease   GERD (gastroesophageal reflux disease)   HTN (hypertension)   Type 2 diabetes mellitus without complication, without long-term current use of insulin ([name])   COPD with acute exacerbation ([name])   Community acquired pneumonia of left lower lobe of lung   Acute on chronic respiratory failure ([name])   Acute on chronic hypoxemic respiratory failure secondary to COPD exacerbation -Wears 3-4L [place] at home -Continue on steroids, reduce dose to IV 60 mg twice daily -Add Pulmicort -Continue azithromycin and Rocephin -Continue duo nebs every 6 hours -Mucinex -Flutter valve and incentive spirometry  DM2-no significant hyperglycemia -Monitor for steroid-induced hyperglycemia -Continue SSI -Holding home oral hypoglycemics  Hypertension -Soft BP noted -Hold home bisoprolol and Lasix until improved  GERD -PPI  NASH   DVT prophylaxis: Lovenox Code Status:  Full Family Communication: Patient will call wife Disposition Plan:  Status is: Inpatient  Remains inpatient appropriate because:IV treatments appropriate due to intensity of illness or inability to take PO and Inpatient level of care appropriate due to severity of illness   Dispo: The patient is from: Home              Anticipated d/c is to: Home              Patient currently is not medically stable to d/c.   Difficult to place patient No  Consultants:   None  Procedures:   See below  Antimicrobials:  Anti-infectives (From admission, onward)   Start     Dose/Rate Route Frequency Ordered Stop   09/04/20 2015  azithromycin (ZITHROMAX) tablet 500 mg       "Followed by" Linked Group Details   500 mg Oral Daily 09/03/20 2003 09/08/20 0959   09/03/20 1845  azithromycin (ZITHROMAX) 500 mg in sodium chloride 0.9 % 250 mL IVPB       "Followed by" Linked Group Details   500 mg 250 mL/hr over 60 Minutes Intravenous Every 24 hours 09/03/20 2003 09/03/20 2221   09/03/20 1515  cefTRIAXone (ROCEPHIN) 2 g in sodium chloride 0.9 % 100 mL IVPB        2 g 200 mL/hr over 30 Minutes Intravenous Every 24 hours 09/03/20 1512     09/03/20 1515  azithromycin (ZITHROMAX) tablet 500 mg        500 mg Oral Daily 09/03/20 1512 09/03/20 1552       Subjective: Patient seen and evaluated today with no new acute complaints or concerns.  No acute concerns or events noted overnight. He states he is feeling some better and is breathing better today.  Objective: Vitals:   09/04/20 0303 09/04/20 0406 09/04/20 0808 09/04/20 0848  BP:  93/64 105/70   Pulse:  91 87   Resp:  20 20   Temp:  (!) 97.4 F (36.3 C) (!) 97.5 F (36.4 C)   TempSrc:  Oral Oral   SpO2: 96% 93% 95% 96%  Weight:      Height:        Intake/Output Summary (Last 24 hours) at 09/04/2020 1026 Last data filed at 09/04/2020 0411 Gross per 24 hour  Intake 340 ml  Output 2 ml  Net 338 ml   Filed Weights   09/03/20 2033  Weight:  69 kg    Examination:  General exam: Appears calm and comfortable  Respiratory system: Clear to auscultation. Respiratory effort normal. Currently on 4L [place]. Cardiovascular system: S1 & S2 heard, RRR.  Gastrointestinal system: Abdomen is soft Central nervous system: Alert and awake Extremities: No edema Skin: No significant lesions noted Psychiatry: Flat affect.    Data Reviewed: I have personally reviewed following labs and imaging studies  CBC: Recent Labs  Lab 09/03/20 1535 09/04/20 0604  WBC 10.0 4.0  NEUTROABS 9.1*  --   HGB 12.9* 12.8*  HCT 42.1 41.5  MCV 83.7 83.8  PLT 243 716   Basic Metabolic Panel: Recent Labs  Lab 09/03/20 1535 09/04/20 0604  NA 132* 138  K 4.1 4.3  CL 100 103  CO2 25 26  GLUCOSE 115* 153*  BUN 12 16  CREATININE 0.61 0.57*  CALCIUM 8.7* 8.8*   GFR: Estimated Creatinine Clearance: 86.1 mL/min (A) (by C-G formula based on SCr of 0.57 mg/dL (L)). Liver Function Tests: Recent Labs  Lab 09/03/20 1535  AST 20  ALT 35  ALKPHOS 98  BILITOT 1.1  PROT 9.0*  ALBUMIN 3.0*   No results for input(s): LIPASE, AMYLASE in the last 168 hours. No results for input(s): AMMONIA in the last 168 hours. Coagulation Profile: Recent Labs  Lab 09/03/20 1535  INR 1.2   Cardiac Enzymes: No results for input(s): CKTOTAL, CKMB, CKMBINDEX, TROPONINI in the last 168 hours. BNP (last 3 results) No results for input(s): PROBNP in the last 8760 hours. HbA1C: No results for input(s): HGBA1C in the last 72 hours. CBG: Recent Labs  Lab 09/03/20 1737 09/03/20 2305 09/04/20 0845  GLUCAP 144* 292* 168*   Lipid Profile: No results for input(s): CHOL, HDL, LDLCALC, TRIG, CHOLHDL, LDLDIRECT in the last 72 hours. Thyroid Function Tests: No results for input(s): TSH, T4TOTAL, FREET4, T3FREE, THYROIDAB in the last 72 hours. Anemia Panel: No results for input(s): VITAMINB12, FOLATE, FERRITIN, TIBC, IRON, RETICCTPCT in the last 72 hours. Sepsis  Labs: Recent Labs  Lab 09/03/20 1535 09/03/20 1702 09/04/20 0604  PROCALCITON  --   --  0.37  LATICACIDVEN 1.1 1.2  --     Recent Results (from the past 240 hour(s))  Resp Panel by RT-PCR (Flu A&B, Covid) Nasopharyngeal Swab     Status: None   Collection Time: 09/03/20  3:34 PM   Specimen: Nasopharyngeal Swab; Nasopharyngeal(NP) swabs in vial transport medium  Result Value Ref Range Status   SARS Coronavirus 2 by RT PCR NEGATIVE NEGATIVE Final    Comment: (NOTE) SARS-CoV-2 target nucleic acids are NOT DETECTED.  The SARS-CoV-2 RNA is generally detectable in upper respiratory specimens during the acute phase of infection. The lowest concentration of SARS-CoV-2 viral  copies this assay can detect is 138 copies/mL. A negative result does not preclude SARS-Cov-2 infection and should not be used as the sole basis for treatment or other patient management decisions. A negative result may occur with  improper specimen collection/handling, submission of specimen other than nasopharyngeal swab, presence of viral mutation(s) within the areas targeted by this assay, and inadequate number of viral copies(<138 copies/mL). A negative result must be combined with clinical observations, patient history, and epidemiological information. The expected result is Negative.  Fact Sheet for Patients:  EntrepreneurPulse.com.au  Fact Sheet for Healthcare Providers:  IncredibleEmployment.be  This test is no t yet approved or cleared by the [place] FDA and  has been authorized for detection and/or diagnosis of SARS-CoV-2 by FDA under an Emergency Use Authorization (EUA). This EUA will remain  in effect (meaning this test can be used) for the duration of the COVID-19 declaration under Section 564(b)(1) of the Act, 21 U.S.C.section 360bbb-3(b)(1), unless the authorization is terminated  or revoked sooner.       Influenza A by PCR NEGATIVE NEGATIVE Final    Influenza B by PCR NEGATIVE NEGATIVE Final    Comment: (NOTE) The Xpert Xpress SARS-CoV-2/FLU/RSV plus assay is intended as an aid in the diagnosis of influenza from Nasopharyngeal swab specimens and should not be used as a sole basis for treatment. Nasal washings and aspirates are unacceptable for Xpert Xpress SARS-CoV-2/FLU/RSV testing.  Fact Sheet for Patients: EntrepreneurPulse.com.au  Fact Sheet for Healthcare Providers: IncredibleEmployment.be  This test is not yet approved or cleared by the [place] FDA and has been authorized for detection and/or diagnosis of SARS-CoV-2 by FDA under an Emergency Use Authorization (EUA). This EUA will remain in effect (meaning this test can be used) for the duration of the COVID-19 declaration under Section 564(b)(1) of the Act, 21 U.S.C. section 360bbb-3(b)(1), unless the authorization is terminated or revoked.  Performed at [facility], [address] [place], [place] [address]   Blood Culture (routine x 2)     Status: None (Preliminary result)   Collection Time: 09/03/20  3:39 PM   Specimen: BLOOD RIGHT HAND  Result Value Ref Range Status   Specimen Description   Final    BLOOD RIGHT HAND BOTTLES DRAWN AEROBIC AND ANAEROBIC   Special Requests Blood Culture adequate volume  Final   Culture   Final    NO GROWTH < 24 HOURS Performed at [facility], [address], [place], [place] [address]    Report Status PENDING  Incomplete  Blood Culture (routine x 2)     Status: None (Preliminary result)   Collection Time: 09/03/20  3:39 PM   Specimen: BLOOD LEFT ARM  Result Value Ref Range Status   Specimen Description BLOOD LEFT ARM BOTTLES DRAWN AEROBIC AND ANAEROBIC  Final   Special Requests Blood Culture adequate volume  Final   Culture   Final    NO GROWTH < 24 HOURS Performed at [facility], [address] [place], [place] [address]    Report Status PENDING  Incomplete         Radiology  Studies: DG Chest Port 1 View  Result Date: 09/03/2020 CLINICAL DATA:  Questionable sepsis.  History of COPD. EXAM: PORTABLE CHEST 1 VIEW COMPARISON:  August 22, 2020 FINDINGS: Cardiomediastinal silhouette is normal. Mediastinal contours appear intact. Improved aeration of the left lung with residual less prominent patchy airspace consolidation in the mid lung field. Milder peribronchial airspace consolidation throughout the right lung. No evidence of  pleural effusions. Osseous structures are without acute abnormality. Soft tissues are grossly normal. IMPRESSION: 1. Improved aeration of the left lung with residual less prominent patchy airspace consolidation in the mid lung field. 2. Milder peribronchial airspace consolidation throughout the right lung. Electronically Signed   By: Fidela Salisbury M.D.   On: 09/03/2020 15:52        Scheduled Meds: . aspirin EC  81 mg Oral Q breakfast  . atorvastatin  40 mg Oral Daily  . azithromycin  500 mg Oral Daily  . budesonide (PULMICORT) nebulizer solution  0.25 mg Nebulization BID  . empagliflozin  25 mg Oral QPM  . enoxaparin (LOVENOX) injection  40 mg Subcutaneous Q24H  . fluticasone furoate-vilanterol  1 puff Inhalation Daily  . gabapentin  300 mg Oral QHS  . insulin aspart  0-20 Units Subcutaneous TID WC  . insulin aspart  0-5 Units Subcutaneous QHS  . insulin aspart  6 Units Subcutaneous TID WC  . insulin detemir  10 Units Subcutaneous QHS  . ipratropium-albuterol  3 mL Nebulization Q6H  . methylPREDNISolone (SOLU-MEDROL) injection  60 mg Intravenous Q12H  . pantoprazole  40 mg Oral Daily  . potassium chloride  10 mEq Oral Daily  . sertraline  50 mg Oral Daily  . tamsulosin  0.4 mg Oral Daily  . thiamine  100 mg Oral Daily   Continuous Infusions: . cefTRIAXone (ROCEPHIN)  IV Stopped (09/03/20 1700)     LOS: 1 day    Time spent: 35 minutes    November Sypher Darleen Crocker, DO [facility]  If 7PM-7AM, please contact  night-coverage [email or web address] 09/04/2020, 10:26 AM

## 2020-09-05 DIAGNOSIS — J9621 Acute and chronic respiratory failure with hypoxia: Secondary | ICD-10-CM | POA: Diagnosis not present

## 2020-09-05 LAB — CBC
HCT: 41 % (ref 39.0–52.0)
Hemoglobin: 12.6 g/dL — ABNORMAL LOW (ref 13.0–17.0)
MCH: 25.8 pg — ABNORMAL LOW (ref 26.0–34.0)
MCHC: 30.7 g/dL (ref 30.0–36.0)
MCV: 84 fL (ref 80.0–100.0)
Platelets: 215 10*3/uL (ref 150–400)
RBC: 4.88 MIL/uL (ref 4.22–5.81)
RDW: 15.2 % (ref 11.5–15.5)
WBC: 6.5 10*3/uL (ref 4.0–10.5)
nRBC: 0 % (ref 0.0–0.2)

## 2020-09-05 LAB — GLUCOSE, CAPILLARY
Glucose-Capillary: 170 mg/dL — ABNORMAL HIGH (ref 70–99)
Glucose-Capillary: 164 mg/dL — ABNORMAL HIGH (ref 70–99)
Glucose-Capillary: 170 mg/dL — ABNORMAL HIGH (ref 70–99)
Glucose-Capillary: 220 mg/dL — ABNORMAL HIGH (ref 70–99)

## 2020-09-05 LAB — BASIC METABOLIC PANEL
Anion gap: 10 (ref 5–15)
BUN: 20 mg/dL (ref 8–23)
CO2: 27 mmol/L (ref 22–32)
Calcium: 9.2 mg/dL (ref 8.9–10.3)
Chloride: 105 mmol/L (ref 98–111)
Creatinine, Ser: 0.58 mg/dL — ABNORMAL LOW (ref 0.61–1.24)
GFR, Estimated: >60 mL/min (ref >60)
Glucose, Bld: 211 mg/dL — ABNORMAL HIGH (ref 70–99)
Potassium: 4.3 mmol/L (ref 3.5–5.1)
Sodium: 142 mmol/L (ref 135–145)

## 2020-09-05 LAB — URINE CULTURE: Culture: NO GROWTH

## 2020-09-05 LAB — MAGNESIUM: Magnesium: 2.2 mg/dL (ref 1.7–2.4)

## 2020-09-05 MED ORDER — METHYLPREDNISOLONE SODIUM SUCC 40 MG IJ SOLR
40.0000 mg | Freq: Two times a day (BID) | INTRAMUSCULAR | Status: DC
  Administered 2020-09-05 – 2020-09-06 (×2): 40 mg via INTRAVENOUS
  Filled 2020-09-05 (×2): qty 1

## 2020-09-05 MED ORDER — GLUCERNA SHAKE PO LIQD
237.0000 mL | Freq: Two times a day (BID) | ORAL | Status: DC
  Administered 2020-09-05 – 2020-09-06 (×2): 237 mL via ORAL

## 2020-09-05 NOTE — Progress Notes (Signed)
Initial Nutrition Assessment  DOCUMENTATION CODES:   Non-severe (moderate) malnutrition in context of acute illness/injury  INTERVENTION:  Heart Healthy/CHO modified diet  Glucerna Shake po BID, each supplement provides 220 kcal and 10 grams of protein. (between meals)    NUTRITION DIAGNOSIS:   Moderate Malnutrition related to acute illness,chronic illness (COPD, recurrent pneumonia, liver disease) as evidenced by mild fat depletion,moderate muscle depletion,energy intake < or equal to 75% for > or equal to 1 month, acute wt loss/15 lb < 1 month.  GOAL:  Patient will meet greater than or equal to 90% of their needs  MONITOR:   Labs,PO intake,Weight trends,Supplement acceptance  REASON FOR ASSESSMENT:   Malnutrition Screening Tool    ASSESSMENT: Patient is a 65 yo male of NASH, DM2, COPD and HTN. He presents with shortness of breath. Recent treatment with antibiotic due to pneumonia and septic shock. Acute on chronic respiratory failure-COPD exacerbation. [name] reports multiple episodes of pneumonia the past 6 months and says he "hopes to be fully recovered this time".  Patient says his appetite has been decreased until yesterday. He was able to eat very well. Yesterday meal intake at lunch/dinner 100%. He also ate well at breakfast per his report- 2 bowls of oatmeal and pancakes.   Weight 69 kg currently (nursing obtained re-wt to confirm. Reflects a loss of ~15 lb/8% in < 1 month. On 2/8-wt 75.2 kg (165 lb) which is usual range based pt report and hospital records.   Medications: Novolog, Jardiance, Levemir, Lipitor, Solumedrol, Klor-Con, Protonix, Thiamine.  Labs: BMP Latest Ref Rng & Units 09/05/2020 09/04/2020 09/03/2020  Glucose 70 - 99 mg/dL 211(H) 153(H) 115(H)  BUN 8 - 23 mg/dL 20 16 12   Creatinine 0.61 - 1.24 mg/dL 0.58(L) 0.57(L) 0.61  Sodium 135 - 145 mmol/L 142 138 132(L)  Potassium 3.5 - 5.1 mmol/L 4.3 4.3 4.1  Chloride 98 - 111 mmol/L 105 103 100  CO2 22 - 32  mmol/L 27 26 25   Calcium 8.9 - 10.3 mg/dL 9.2 8.8(L) 8.7(L)     NUTRITION - FOCUSED PHYSICAL EXAM:  Mild subcutaneous fat depletion orbital and upper arms, moderate muscle loss upper body (temporal and clavicle/ deltoid).   Diet Order:   Diet Order            Diet heart healthy/carb modified Room service appropriate? Yes; Fluid consistency: Thin  Diet effective now                 EDUCATION NEEDS:  Education needs have been addressed  Skin:  Skin Assessment: Reviewed RN Assessment  Last BM:  2/27  Height:   Ht Readings from Last 1 Encounters:  09/03/20 5' 7"  (1.702 m)    Weight:   Wt Readings from Last 1 Encounters:  09/03/20 69 kg    Ideal Body Weight:   67 kg  BMI:  Body mass index is 23.82 kg/m.  Estimated Nutritional Needs:   Kcal:  1932-2100  Protein:  90-95 gr  Fluid:  1.9-2.1 liters daily  Colman Cater MS,RD,CSG,LDN Pager: Shea Evans

## 2020-09-05 NOTE — Progress Notes (Signed)
PROGRESS NOTE    [name]  DVV:[id number] DOB: Jul 07, 1956 DOA: 09/03/2020 PCP: Asencion Noble, MD   Brief Narrative:  Per HPI: [name] a 65 y.o.malewith a history of COPD, diabetes, hypertension, GERD, [name]. Patient seen for worsening shortness of breath since yesterday evening. Worse with exertion and improved with rest. Does have wheezing and coughing. He was recently diagnosed with pneumonia and septic shock and was recently released from the hospital. He has been on Augmentin for antibiotics. No other palliating or provoking factors. Does have sputum production with a lot of coughing. Describes as greenish and thick. Has been compliant with inhalers.  -Patient was admitted with acute on chronic hypoxemic respiratory failure secondary to acute COPD exacerbation.  He was recently discharged on 2/15 after treatment for septic shock related to left lower lobe pneumonia and was doing well at home until the day before admission.   Assessment & Plan:   Active Problems:   Non-alcoholic fatty liver disease   GERD (gastroesophageal reflux disease)   HTN (hypertension)   Type 2 diabetes mellitus without complication, without long-term current use of insulin ([name])   COPD with acute exacerbation ([name])   Community acquired pneumonia of left lower lobe of lung   Acute on chronic respiratory failure ([name])   Acute on chronic hypoxemic respiratory failure secondary to COPD exacerbation -Wears 3-4L [place] at home -Continue on steroids, reduce dose to IV 40 mg twice daily -Add Pulmicort -Continue azithromycin and Rocephin -Continue duo nebs every 6 hours for now -Mucinex -Flutter valve and incentive spirometry -Patient has follow-up with pulmonology Dr. Melvyn Novas on 3/10  DM2-no significant hyperglycemia -Monitor for steroid-induced hyperglycemia -Continue SSI -Holding home oral hypoglycemics  Hypertension -Soft BP noted -Hold home bisoprolol and Lasix until improved  further  GERD -PPI  NASH   DVT prophylaxis: Lovenox Code Status: Full Family Communication: Patient will call wife Disposition Plan:  Status is: Inpatient  Remains inpatient appropriate because:IV treatments appropriate due to intensity of illness or inability to take PO and Inpatient level of care appropriate due to severity of illness   Dispo: The patient is from: Home  Anticipated d/c is to: Home  Patient currently is not medically stable to d/c.              Difficult to place patient No  Consultants:   None  Procedures:   See below  Antimicrobials:  Anti-infectives (From admission, onward)   Start     Dose/Rate Route Frequency Ordered Stop   09/04/20 2015  azithromycin (ZITHROMAX) tablet 500 mg       "Followed by" Linked Group Details   500 mg Oral Daily 09/03/20 2003 09/08/20 0959   09/03/20 1845  azithromycin (ZITHROMAX) 500 mg in sodium chloride 0.9 % 250 mL IVPB       "Followed by" Linked Group Details   500 mg 250 mL/hr over 60 Minutes Intravenous Every 24 hours 09/03/20 2003 09/03/20 2221   09/03/20 1515  cefTRIAXone (ROCEPHIN) 2 g in sodium chloride 0.9 % 100 mL IVPB        2 g 200 mL/hr over 30 Minutes Intravenous Every 24 hours 09/03/20 1512     09/03/20 1515  azithromycin (ZITHROMAX) tablet 500 mg        500 mg Oral Daily 09/03/20 1512 09/03/20 1552       Subjective: Patient seen and evaluated today with some ongoing coughing that is productive of greenish sputum.  He is still having some chest tightness and  shortness of breath, but this is slowly improving.  Objective: Vitals:   09/04/20 1920 09/04/20 2159 09/05/20 0203 09/05/20 0717  BP:  104/66    Pulse:  78    Resp:  18    Temp:  97.9 F (36.6 C)    TempSrc:      SpO2: 93% 95% 96% 97%  Weight:      Height:        Intake/Output Summary (Last 24 hours) at 09/05/2020 0908 Last data filed at 09/05/2020 0700 Gross per 24 hour  Intake 240 ml  Output 1 ml   Net 239 ml   Filed Weights   09/03/20 2033  Weight: 69 kg    Examination:  General exam: Appears calm and comfortable  Respiratory system: Minimal wheezing bilaterally.[name] Respiratory effort normal.  On 4 L nasal cannula oxygen. Cardiovascular system: S1 & S2 heard, RRR.  Gastrointestinal system: Abdomen is soft Central nervous system: Alert and awake Extremities: No edema Skin: No significant lesions noted Psychiatry: Flat affect.    Data Reviewed: I have personally reviewed following labs and imaging studies  CBC: Recent Labs  Lab 09/03/20 1535 09/04/20 0604 09/05/20 0618  WBC 10.0 4.0 6.5  NEUTROABS 9.1*  --   --   HGB 12.9* 12.8* 12.6*  HCT 42.1 41.5 41.0  MCV 83.7 83.8 84.0  PLT 243 198 161   Basic Metabolic Panel: Recent Labs  Lab 09/03/20 1535 09/04/20 0604 09/05/20 0618  NA 132* 138 142  K 4.1 4.3 4.3  CL 100 103 105  CO2 25 26 27   GLUCOSE 115* 153* 211*  BUN 12 16 20   CREATININE 0.61 0.57* 0.58*  CALCIUM 8.7* 8.8* 9.2  MG  --   --  2.2   GFR: Estimated Creatinine Clearance: 86.1 mL/min (A) (by C-G formula based on SCr of 0.58 mg/dL (L)). Liver Function Tests: Recent Labs  Lab 09/03/20 1535  AST 20  ALT 35  ALKPHOS 98  BILITOT 1.1  PROT 9.0*  ALBUMIN 3.0*   No results for input(s): LIPASE, AMYLASE in the last 168 hours. No results for input(s): AMMONIA in the last 168 hours. Coagulation Profile: Recent Labs  Lab 09/03/20 1535  INR 1.2   Cardiac Enzymes: No results for input(s): CKTOTAL, CKMB, CKMBINDEX, TROPONINI in the last 168 hours. BNP (last 3 results) No results for input(s): PROBNP in the last 8760 hours. HbA1C: No results for input(s): HGBA1C in the last 72 hours. CBG: Recent Labs  Lab 09/04/20 0845 09/04/20 1056 09/04/20 1706 09/04/20 2156 09/05/20 0757  GLUCAP 168* 244* 189* 329* 170*   Lipid Profile: No results for input(s): CHOL, HDL, LDLCALC, TRIG, CHOLHDL, LDLDIRECT in the last 72 hours. Thyroid Function  Tests: No results for input(s): TSH, T4TOTAL, FREET4, T3FREE, THYROIDAB in the last 72 hours. Anemia Panel: No results for input(s): VITAMINB12, FOLATE, FERRITIN, TIBC, IRON, RETICCTPCT in the last 72 hours. Sepsis Labs: Recent Labs  Lab 09/03/20 1535 09/03/20 1702 09/04/20 0604  PROCALCITON  --   --  0.37  LATICACIDVEN 1.1 1.2  --     Recent Results (from the past 240 hour(s))  Resp Panel by RT-PCR (Flu A&B, Covid) Nasopharyngeal Swab     Status: None   Collection Time: 09/03/20  3:34 PM   Specimen: Nasopharyngeal Swab; Nasopharyngeal(NP) swabs in vial transport medium  Result Value Ref Range Status   SARS Coronavirus 2 by RT PCR NEGATIVE NEGATIVE Final    Comment: (NOTE) SARS-CoV-2 target nucleic acids are NOT DETECTED.  The SARS-CoV-2 RNA is generally detectable in upper respiratory specimens during the acute phase of infection. The lowest concentration of SARS-CoV-2 viral copies this assay can detect is 138 copies/mL. A negative result does not preclude SARS-Cov-2 infection and should not be used as the sole basis for treatment or other patient management decisions. A negative result may occur with  improper specimen collection/handling, submission of specimen other than nasopharyngeal swab, presence of viral mutation(s) within the areas targeted by this assay, and inadequate number of viral copies(<138 copies/mL). A negative result must be combined with clinical observations, patient history, and epidemiological information. The expected result is Negative.  Fact Sheet for Patients:  EntrepreneurPulse.com.au  Fact Sheet for Healthcare Providers:  IncredibleEmployment.be  This test is no t yet approved or cleared by the [place] FDA and  has been authorized for detection and/or diagnosis of SARS-CoV-2 by FDA under an Emergency Use Authorization (EUA). This EUA will remain  in effect (meaning this test can be used) for the  duration of the COVID-19 declaration under Section 564(b)(1) of the Act, 21 U.S.C.section 360bbb-3(b)(1), unless the authorization is terminated  or revoked sooner.       Influenza A by PCR NEGATIVE NEGATIVE Final   Influenza B by PCR NEGATIVE NEGATIVE Final    Comment: (NOTE) The Xpert Xpress SARS-CoV-2/FLU/RSV plus assay is intended as an aid in the diagnosis of influenza from Nasopharyngeal swab specimens and should not be used as a sole basis for treatment. Nasal washings and aspirates are unacceptable for Xpert Xpress SARS-CoV-2/FLU/RSV testing.  Fact Sheet for Patients: EntrepreneurPulse.com.au  Fact Sheet for Healthcare Providers: IncredibleEmployment.be  This test is not yet approved or cleared by the [place] FDA and has been authorized for detection and/or diagnosis of SARS-CoV-2 by FDA under an Emergency Use Authorization (EUA). This EUA will remain in effect (meaning this test can be used) for the duration of the COVID-19 declaration under Section 564(b)(1) of the Act, 21 U.S.C. section 360bbb-3(b)(1), unless the authorization is terminated or revoked.  Performed at [facility], [address], [place], [place] [address]   Blood Culture (routine x 2)     Status: None (Preliminary result)   Collection Time: 09/03/20  3:39 PM   Specimen: BLOOD RIGHT HAND  Result Value Ref Range Status   Specimen Description   Final    BLOOD RIGHT HAND BOTTLES DRAWN AEROBIC AND ANAEROBIC   Special Requests Blood Culture adequate volume  Final   Culture   Final    NO GROWTH 2 DAYS Performed at [facility], [address] [place], [place] [address]    Report Status PENDING  Incomplete  Blood Culture (routine x 2)     Status: None (Preliminary result)   Collection Time: 09/03/20  3:39 PM   Specimen: BLOOD LEFT ARM  Result Value Ref Range Status   Specimen Description BLOOD LEFT ARM BOTTLES DRAWN AEROBIC AND ANAEROBIC  Final   Special  Requests Blood Culture adequate volume  Final   Culture   Final    NO GROWTH 2 DAYS Performed at [facility], [address], [place], [place] [address]    Report Status PENDING  Incomplete  Urine culture     Status: None   Collection Time: 09/03/20  4:36 PM   Specimen: Urine, Catheterized  Result Value Ref Range Status   Specimen Description   Final    URINE, CATHETERIZED Performed at [facility], [address] [place], [place] [address]    Special Requests  Final    NONE Performed at [facility], [address] [place], [place] [address]    Culture   Final    NO GROWTH Performed at [facility], [place] [address] [place], [place] [address]    Report Status 09/05/2020 FINAL  Final         Radiology Studies: DG Chest Port 1 View  Result Date: 09/03/2020 CLINICAL DATA:  Questionable sepsis.  History of COPD. EXAM: PORTABLE CHEST 1 VIEW COMPARISON:  August 22, 2020 FINDINGS: Cardiomediastinal silhouette is normal. Mediastinal contours appear intact. Improved aeration of the left lung with residual less prominent patchy airspace consolidation in the mid lung field. Milder peribronchial airspace consolidation throughout the right lung. No evidence of pleural effusions. Osseous structures are without acute abnormality. Soft tissues are grossly normal. IMPRESSION: 1. Improved aeration of the left lung with residual less prominent patchy airspace consolidation in the mid lung field. 2. Milder peribronchial airspace consolidation throughout the right lung. Electronically Signed   By: Fidela Salisbury M.D.   On: 09/03/2020 15:52        Scheduled Meds: . aspirin EC  81 mg Oral Q breakfast  . atorvastatin  40 mg Oral Daily  . azithromycin  500 mg Oral Daily  . budesonide (PULMICORT) nebulizer solution  0.25 mg Nebulization BID  . empagliflozin  25 mg Oral QPM  . enoxaparin (LOVENOX) injection  40 mg Subcutaneous Q24H  . fluticasone furoate-vilanterol  1 puff Inhalation  Daily  . gabapentin  300 mg Oral QHS  . insulin aspart  0-20 Units Subcutaneous TID WC  . insulin aspart  0-5 Units Subcutaneous QHS  . insulin aspart  6 Units Subcutaneous TID WC  . insulin detemir  10 Units Subcutaneous QHS  . ipratropium-albuterol  3 mL Nebulization Q6H  . methylPREDNISolone (SOLU-MEDROL) injection  40 mg Intravenous Q12H  . pantoprazole  40 mg Oral Daily  . potassium chloride  10 mEq Oral Daily  . sertraline  50 mg Oral Daily  . tamsulosin  0.4 mg Oral Daily  . thiamine  100 mg Oral Daily   Continuous Infusions: . cefTRIAXone (ROCEPHIN)  IV 2 g (09/04/20 1529)     LOS: 2 days    Time spent: 35 minutes    Baya Lentz Darleen Crocker, DO [facility]  If 7PM-7AM, please contact night-coverage [email or web address] 09/05/2020, 9:08 AM

## 2020-09-06 DIAGNOSIS — J9621 Acute and chronic respiratory failure with hypoxia: Secondary | ICD-10-CM | POA: Diagnosis not present

## 2020-09-06 LAB — GLUCOSE, CAPILLARY: Glucose-Capillary: 121 mg/dL — ABNORMAL HIGH (ref 70–99)

## 2020-09-06 MED ORDER — AZITHROMYCIN 500 MG PO TABS: 500.0000 mg | ORAL_TABLET | Freq: Every day | ORAL | 0 refills | Status: AC

## 2020-09-06 MED ORDER — PREDNISONE 10 MG PO TABS: ORAL_TABLET | ORAL | 0 refills | Status: AC

## 2020-09-06 NOTE — Discharge Summary (Signed)
Physician Discharge Summary  [name] AUQ:[id number] DOB: 27-Nov-1955 DOA: 09/03/2020  PCP: Asencion Noble, MD  Admit date: 09/03/2020  Discharge date: 09/06/2020  Admitted From:Home  Disposition:  Home  Recommendations for Outpatient Follow-up:  1. Follow up with PCP in 1-2 weeks 2. Follow-up with Dr. Melvyn Novas with pulmonology as scheduled on 3/10 3. Continue on home breathing treatments as needed 4. Continue on prednisone taper as prescribed 5. Continue on azithromycin for 3 more days  Home Health: None  Equipment/Devices: Has home nasal cannula 3-4 L  Discharge Condition:Stable  CODE STATUS: Full  Diet recommendation: Heart Healthy/carb modified  Brief/Interim Summary: Per HPI: Christopher Hohensee Mooreis a 65 y.o.malewith a history of COPD, diabetes, hypertension, GERD, [place]. Patient seen for worsening shortness of breath since yesterday evening. Worse with exertion and improved with rest. Does have wheezing and coughing. He was recently diagnosed with pneumonia and septic shock and was recently released from the hospital. He has been on Augmentin for antibiotics. No other palliating or provoking factors. Does have sputum production with a lot of coughing. Describes as greenish and thick. Has been compliant with inhalers.  -Patient was admitted with acute on chronic hypoxemic respiratory failure secondary to acute COPD exacerbation. He was recently discharged on 2/15 after treatment for septic shock related to left lower lobe pneumonia and was doing well at home until the day before admission.  He was treated with breathing treatments, antitussives, and IV steroids with significant improvement noted over the course of 3 days.  He was also kept empirically on Rocephin and azithromycin, but no significant bacterial pneumonia was suspected.  He is in stable condition for discharge and appears to be back to his usual baseline level of functioning.  He has remained on his usual 4 L nasal  cannula for oxygen support last 2 days.  Discharge Diagnoses:  Active Problems:   Non-alcoholic fatty liver disease   GERD (gastroesophageal reflux disease)   HTN (hypertension)   Type 2 diabetes mellitus without complication, without long-term current use of insulin ([name])   COPD with acute exacerbation ([place])   Community acquired pneumonia of left lower lobe of lung   Acute on chronic respiratory failure ([place])  Principal discharge diagnosis: Acute on chronic hypoxemic respiratory failure secondary to COPD exacerbation.  Discharge Instructions  Discharge Instructions    Diet - low sodium heart healthy   Complete by: As directed    Increase activity slowly   Complete by: As directed      Allergies as of 09/06/2020      Reactions   Baclofen Other (See Comments)   lethargic    Simvastatin Nausea Only      Medication List    TAKE these medications   acetaminophen 500 MG tablet Commonly known as: TYLENOL Take 1,000 mg by mouth daily as needed for headache.   albuterol (2.5 MG/3ML) 0.083% nebulizer solution Commonly known as: PROVENTIL Take 3 mLs (2.5 mg total) by nebulization every 2 (two) hours as needed for wheezing or shortness of breath (cough).   ProAir HFA 108 (90 Base) MCG/ACT inhaler Generic drug: albuterol Inhale 2 puffs into the lungs every 4 (four) hours as needed for wheezing or shortness of breath.   ALPRAZolam 0.25 MG tablet Commonly known as: XANAX Take 0.25 mg by mouth 2 (two) times daily as needed for sleep.   aspirin EC 81 MG tablet Take 1 tablet (81 mg total) by mouth daily with breakfast.   atorvastatin 40 MG tablet Commonly known as:  LIPITOR Take 1 tablet (40 mg total) by mouth daily.   azithromycin 500 MG tablet Commonly known as: Zithromax Take 1 tablet (500 mg total) by mouth daily for 3 days. Take 1 tablet daily for 3 days.   bisoprolol 5 MG tablet Commonly known as: ZEBETA Take 1 tablet (5 mg total) by mouth daily.    budesonide-formoterol 160-4.5 MCG/ACT inhaler Commonly known as: Symbicort Inhale 2 puffs into the lungs 2 (two) times daily.   [employer] Vitamin C 250 MG tablet Generic drug: ascorbic acid Take 250 mg by mouth daily.   furosemide 20 MG tablet Commonly known as: LASIX Take 20 mg by mouth daily.   gabapentin 300 MG capsule Commonly known as: NEURONTIN Take 1 capsule (300 mg total) by mouth at bedtime.   glimepiride 2 MG tablet Commonly known as: AMARYL Take 1 tablet (2 mg total) by mouth daily with breakfast. Please do NOT take if you skip breakfast   guaiFENesin 600 MG 12 hr tablet Commonly known as: MUCINEX Take 1 tablet (600 mg total) by mouth 2 (two) times daily.   guaiFENesin-dextromethorphan 100-10 MG/5ML syrup Commonly known as: ROBITUSSIN DM Take 5 mLs by mouth every 4 (four) hours as needed for cough (chest congestion).   Jardiance 25 MG Tabs tablet Generic drug: empagliflozin Take 25 mg by mouth every evening.   multivitamin with minerals Tabs tablet Take 1 tablet by mouth at bedtime.   OneTouch Delica Lancets 99J Misc   OneTouch Verio test strip Generic drug: glucose blood   OXYGEN Inhale 3 L into the lungs.   pantoprazole 40 MG tablet Commonly known as: PROTONIX Take 1 tablet (40 mg total) by mouth daily.   potassium chloride 10 MEQ CR capsule Commonly known as: MICRO-K Take 10 mEq by mouth daily.   predniSONE 10 MG tablet Commonly known as: DELTASONE Take 4 tablets (40 mg total) by mouth daily for 2 days, THEN 3 tablets (30 mg total) daily for 2 days, THEN 2 tablets (20 mg total) daily for 2 days, THEN 1 tablet (10 mg total) daily for 2 days. Start taking on: September 06, 2020   sertraline 50 MG tablet Commonly known as: ZOLOFT Take 1 tablet (50 mg total) by mouth daily.   SitaGLIPtin-MetFORMIN HCl [phone] MG Tb24 Take 1 tablet by mouth daily.   sodium chloride 0.65 % Soln nasal spray Commonly known as: OCEAN Place 1 spray into both nostrils as  needed for congestion.   tamsulosin 0.4 MG Caps capsule Commonly known as: FLOMAX Take 0.4 mg by mouth daily.   thiamine 100 MG tablet Take 1 tablet (100 mg total) by mouth daily.   Vitamin D3 25 MCG (1000 UT) Caps Take 1 capsule by mouth daily.       Follow-up Information    Asencion Noble, MD. Schedule an appointment as soon as possible for a visit in 1 week(s).   Specialty: Internal Medicine Contact information: [address] [place] [address] [phone]              Allergies  Allergen Reactions  . Baclofen Other (See Comments)    lethargic   . Simvastatin Nausea Only    Consultations:  None   Procedures/Studies: DG Chest 1 View  Result Date: 08/22/2020 CLINICAL DATA:  Follow-up pneumonia Shortness of breath EXAM: CHEST  1 VIEW COMPARISON:  08/20/2020 FINDINGS: Heart size within normal limits. Mild unchanged pulmonary vascular congestion. New left mid and basilar airspace opacity appears slightly worse compared to prior exam.  Multiple healed right rib fractures again seen. IMPRESSION: Mild interval worsening of left lung airspace disease. Moderate left pleural effusion likely present. Electronically Signed   By: Miachel Roux M.D.   On: 08/22/2020 07:38   [place] CHEST (PLEURAL EFFUSION)  Result Date: 08/22/2020 CLINICAL DATA:  Abnormal chest radiograph, LEFT pleural effusion EXAM: CHEST ULTRASOUND COMPARISON:  Chest radiograph 08/22/2020 FINDINGS: No LEFT pleural effusion identified. IMPRESSION: No LEFT pleural effusion identified. Findings on chest radiograph are therefore likely due to a combination of atelectasis and consolidation in the LEFT lower lobe. Electronically Signed   By: Lavonia Dana M.D.   On: 08/22/2020 11:05   DG Chest Port 1 View  Result Date: 09/03/2020 CLINICAL DATA:  Questionable sepsis.  History of COPD. EXAM: PORTABLE CHEST 1 VIEW COMPARISON:  August 22, 2020 FINDINGS: Cardiomediastinal silhouette is normal. Mediastinal contours  appear intact. Improved aeration of the left lung with residual less prominent patchy airspace consolidation in the mid lung field. Milder peribronchial airspace consolidation throughout the right lung. No evidence of pleural effusions. Osseous structures are without acute abnormality. Soft tissues are grossly normal. IMPRESSION: 1. Improved aeration of the left lung with residual less prominent patchy airspace consolidation in the mid lung field. 2. Milder peribronchial airspace consolidation throughout the right lung. Electronically Signed   By: Fidela Salisbury M.D.   On: 09/03/2020 15:52   DG Chest Port 1 View  Result Date: 08/20/2020 CLINICAL DATA:  65 year old male with shortness of breath, weakness. Evidence of left lung pneumonia and effusion on recent portable chest. EXAM: PORTABLE CHEST 1 VIEW COMPARISON:  Portable chest 08/18/2020 and earlier. FINDINGS: Portable AP upright view at 0613 hours. Underlying large lung volumes. Stable cardiac size and mediastinal contours. Coarse, chronic pulmonary interstitial opacity in both lungs appears stable since last year with large calcified right upper lobe granuloma. Superimposed confluent airspace disease in the left lower lung with probable pleural effusion. The degree of left lung opacity has mildly progressed since yesterday. Only the apex appears completely spared now. Paucity of bowel gas. Stable visualized osseous structures. IMPRESSION: Chronic lung disease with superimposed confluent left lower lung pneumonia, associated pleural effusion which have mildly progressed from yesterday. Electronically Signed   By: Genevie Ann M.D.   On: 08/20/2020 07:49   DG Chest Port 1 View  Result Date: 08/18/2020 CLINICAL DATA:  Shortness of breath, weakness. EXAM: PORTABLE CHEST 1 VIEW COMPARISON:  July 22, 2020. FINDINGS: The heart size and mediastinal contours are within normal limits. No pneumothorax is noted. Large left lower lobe airspace opacity is noted  consistent with pneumonia. Small left pleural effusion is noted. Stable calcified granuloma is noted in right upper lobe. The visualized skeletal structures are unremarkable. IMPRESSION: Large left lower lobe pneumonia. Small left pleural effusion. Electronically Signed   By: Marijo Conception M.D.   On: 08/18/2020 16:06      Discharge Exam: Vitals:   09/06/20 0502 09/06/20 0716  BP: 115/80   Pulse: 62   Resp: 19   Temp: 98.2 F (36.8 C)   SpO2: 95% 99%   Vitals:   09/05/20 2102 09/06/20 0211 09/06/20 0502 09/06/20 0716  BP: 124/75  115/80   Pulse: (!) 102  62   Resp: 20  19   Temp: 98.2 F (36.8 C)  98.2 F (36.8 C)   TempSrc:      SpO2: 90% 92% 95% 99%  Weight:      Height:        General: Pt  is alert, awake, not in acute distress Cardiovascular: RRR, S1/S2 +, no rubs, no gallops Respiratory: CTA bilaterally, no wheezing, no rhonchi, 4 L nasal cannula Abdominal: Soft, NT, ND, bowel sounds + Extremities: no edema, no cyanosis    The results of significant diagnostics from this hospitalization (including imaging, microbiology, ancillary and laboratory) are listed below for reference.     Microbiology: Recent Results (from the past 240 hour(s))  Resp Panel by RT-PCR (Flu A&B, Covid) Nasopharyngeal Swab     Status: None   Collection Time: 09/03/20  3:34 PM   Specimen: Nasopharyngeal Swab; Nasopharyngeal(NP) swabs in vial transport medium  Result Value Ref Range Status   SARS Coronavirus 2 by RT PCR NEGATIVE NEGATIVE Final    Comment: (NOTE) SARS-CoV-2 target nucleic acids are NOT DETECTED.  The SARS-CoV-2 RNA is generally detectable in upper respiratory specimens during the acute phase of infection. The lowest concentration of SARS-CoV-2 viral copies this assay can detect is 138 copies/mL. A negative result does not preclude SARS-Cov-2 infection and should not be used as the sole basis for treatment or other patient management decisions. A negative result may occur  with  improper specimen collection/handling, submission of specimen other than nasopharyngeal swab, presence of viral mutation(s) within the areas targeted by this assay, and inadequate number of viral copies(<138 copies/mL). A negative result must be combined with clinical observations, patient history, and epidemiological information. The expected result is Negative.  Fact Sheet for Patients:  EntrepreneurPulse.com.au  Fact Sheet for Healthcare Providers:  IncredibleEmployment.be  This test is no t yet approved or cleared by the [place] FDA and  has been authorized for detection and/or diagnosis of SARS-CoV-2 by FDA under an Emergency Use Authorization (EUA). This EUA will remain  in effect (meaning this test can be used) for the duration of the COVID-19 declaration under Section 564(b)(1) of the Act, 21 U.S.C.section 360bbb-3(b)(1), unless the authorization is terminated  or revoked sooner.       Influenza A by PCR NEGATIVE NEGATIVE Final   Influenza B by PCR NEGATIVE NEGATIVE Final    Comment: (NOTE) The Xpert Xpress SARS-CoV-2/FLU/RSV plus assay is intended as an aid in the diagnosis of influenza from Nasopharyngeal swab specimens and should not be used as a sole basis for treatment. Nasal washings and aspirates are unacceptable for Xpert Xpress SARS-CoV-2/FLU/RSV testing.  Fact Sheet for Patients: EntrepreneurPulse.com.au  Fact Sheet for Healthcare Providers: IncredibleEmployment.be  This test is not yet approved or cleared by the [place] FDA and has been authorized for detection and/or diagnosis of SARS-CoV-2 by FDA under an Emergency Use Authorization (EUA). This EUA will remain in effect (meaning this test can be used) for the duration of the COVID-19 declaration under Section 564(b)(1) of the Act, 21 U.S.C. section 360bbb-3(b)(1), unless the authorization is terminated  or revoked.  Performed at [facility], [address], [place], [place] [address]   Blood Culture (routine x 2)     Status: None (Preliminary result)   Collection Time: 09/03/20  3:39 PM   Specimen: BLOOD RIGHT HAND  Result Value Ref Range Status   Specimen Description   Final    BLOOD RIGHT HAND BOTTLES DRAWN AEROBIC AND ANAEROBIC   Special Requests Blood Culture adequate volume  Final   Culture   Final    NO GROWTH 3 DAYS Performed at [facility], [address] [place],  [address]    Report Status PENDING  Incomplete  Blood Culture (routine x 2)  Status: None (Preliminary result)   Collection Time: 09/03/20  3:39 PM   Specimen: BLOOD LEFT ARM  Result Value Ref Range Status   Specimen Description BLOOD LEFT ARM BOTTLES DRAWN AEROBIC AND ANAEROBIC  Final   Special Requests Blood Culture adequate volume  Final   Culture   Final    NO GROWTH 3 DAYS Performed at [facility], [address], [place], [place] [address]    Report Status PENDING  Incomplete  Urine culture     Status: None   Collection Time: 09/03/20  4:36 PM   Specimen: Urine, Catheterized  Result Value Ref Range Status   Specimen Description   Final    URINE, CATHETERIZED Performed at [facility], [address], [place], [place] [address]    Special Requests   Final    NONE Performed at [facility], [address], [place], [place] [address]    Culture   Final    NO GROWTH Performed at [facility], [place] [address], [place], [place] [address]    Report Status 09/05/2020 FINAL  Final     Labs: BNP (last 3 results) Recent Labs    05/23/20 1615  BNP 932.3*   Basic Metabolic Panel: Recent Labs  Lab 09/03/20 1535 09/04/20 0604 09/05/20 0618  NA 132* 138 142  K 4.1 4.3 4.3  CL 100 103 105  CO2 25 26 27   GLUCOSE 115* 153* 211*  BUN 12 16 20   CREATININE 0.61 0.57* 0.58*  CALCIUM 8.7* 8.8* 9.2  MG  --   --  2.2   Liver Function Tests: Recent Labs  Lab 09/03/20 1535  AST 20   ALT 35  ALKPHOS 98  BILITOT 1.1  PROT 9.0*  ALBUMIN 3.0*   No results for input(s): LIPASE, AMYLASE in the last 168 hours. No results for input(s): AMMONIA in the last 168 hours. CBC: Recent Labs  Lab 09/03/20 1535 09/04/20 0604 09/05/20 0618  WBC 10.0 4.0 6.5  NEUTROABS 9.1*  --   --   HGB 12.9* 12.8* 12.6*  HCT 42.1 41.5 41.0  MCV 83.7 83.8 84.0  PLT 243 198 215   Cardiac Enzymes: No results for input(s): CKTOTAL, CKMB, CKMBINDEX, TROPONINI in the last 168 hours. BNP: Invalid input(s): POCBNP CBG: Recent Labs  Lab 09/05/20 0757 09/05/20 1110 09/05/20 1554 09/05/20 2102 09/06/20 0741  GLUCAP 170* 164* 170* 220* 121*   D-Dimer No results for input(s): DDIMER in the last 72 hours. Hgb A1c No results for input(s): HGBA1C in the last 72 hours. Lipid Profile No results for input(s): CHOL, HDL, LDLCALC, TRIG, CHOLHDL, LDLDIRECT in the last 72 hours. Thyroid function studies No results for input(s): TSH, T4TOTAL, T3FREE, THYROIDAB in the last 72 hours.  Invalid input(s): FREET3 Anemia work up No results for input(s): VITAMINB12, FOLATE, FERRITIN, TIBC, IRON, RETICCTPCT in the last 72 hours. Urinalysis    Component Value Date/Time   COLORURINE YELLOW 09/03/2020 1636   APPEARANCEUR CLEAR 09/03/2020 1636   LABSPEC 1.022 09/03/2020 1636   PHURINE 5.0 09/03/2020 1636   GLUCOSEU >=500 (A) 09/03/2020 1636   HGBUR NEGATIVE 09/03/2020 1636   BILIRUBINUR NEGATIVE 09/03/2020 1636   KETONESUR NEGATIVE 09/03/2020 1636   PROTEINUR 30 (A) 09/03/2020 1636   NITRITE NEGATIVE 09/03/2020 1636   LEUKOCYTESUR NEGATIVE 09/03/2020 1636   Sepsis Labs Invalid input(s): PROCALCITONIN,  WBC,  LACTICIDVEN Microbiology Recent Results (from the past 240 hour(s))  Resp Panel by RT-PCR (Flu A&B, Covid) Nasopharyngeal Swab  Status: None   Collection Time: 09/03/20  3:34 PM   Specimen: Nasopharyngeal Swab; Nasopharyngeal(NP) swabs in vial transport medium  Result Value Ref Range  Status   SARS Coronavirus 2 by RT PCR NEGATIVE NEGATIVE Final    Comment: (NOTE) SARS-CoV-2 target nucleic acids are NOT DETECTED.  The SARS-CoV-2 RNA is generally detectable in upper respiratory specimens during the acute phase of infection. The lowest concentration of SARS-CoV-2 viral copies this assay can detect is 138 copies/mL. A negative result does not preclude SARS-Cov-2 infection and should not be used as the sole basis for treatment or other patient management decisions. A negative result may occur with  improper specimen collection/handling, submission of specimen other than nasopharyngeal swab, presence of viral mutation(s) within the areas targeted by this assay, and inadequate number of viral copies(<138 copies/mL). A negative result must be combined with clinical observations, patient history, and epidemiological information. The expected result is Negative.  Fact Sheet for Patients:  EntrepreneurPulse.com.au  Fact Sheet for Healthcare Providers:  IncredibleEmployment.be  This test is no t yet approved or cleared by the [place] FDA and  has been authorized for detection and/or diagnosis of SARS-CoV-2 by FDA under an Emergency Use Authorization (EUA). This EUA will remain  in effect (meaning this test can be used) for the duration of the COVID-19 declaration under Section 564(b)(1) of the Act, 21 U.S.C.section 360bbb-3(b)(1), unless the authorization is terminated  or revoked sooner.       Influenza A by PCR NEGATIVE NEGATIVE Final   Influenza B by PCR NEGATIVE NEGATIVE Final    Comment: (NOTE) The Xpert Xpress SARS-CoV-2/FLU/RSV plus assay is intended as an aid in the diagnosis of influenza from Nasopharyngeal swab specimens and should not be used as a sole basis for treatment. Nasal washings and aspirates are unacceptable for Xpert Xpress SARS-CoV-2/FLU/RSV testing.  Fact Sheet for  Patients: EntrepreneurPulse.com.au  Fact Sheet for Healthcare Providers: IncredibleEmployment.be  This test is not yet approved or cleared by the [place] FDA and has been authorized for detection and/or diagnosis of SARS-CoV-2 by FDA under an Emergency Use Authorization (EUA). This EUA will remain in effect (meaning this test can be used) for the duration of the COVID-19 declaration under Section 564(b)(1) of the Act, 21 U.S.C. section 360bbb-3(b)(1), unless the authorization is terminated or revoked.  Performed at [facility], [address] [place], [place] [address]   Blood Culture (routine x 2)     Status: None (Preliminary result)   Collection Time: 09/03/20  3:39 PM   Specimen: BLOOD RIGHT HAND  Result Value Ref Range Status   Specimen Description   Final    BLOOD RIGHT HAND BOTTLES DRAWN AEROBIC AND ANAEROBIC   Special Requests Blood Culture adequate volume  Final   Culture   Final    NO GROWTH 3 DAYS Performed at [facility], [address] [place], [place] [address]    Report Status PENDING  Incomplete  Blood Culture (routine x 2)     Status: None (Preliminary result)   Collection Time: 09/03/20  3:39 PM   Specimen: BLOOD LEFT ARM  Result Value Ref Range Status   Specimen Description BLOOD LEFT ARM BOTTLES DRAWN AEROBIC AND ANAEROBIC  Final   Special Requests Blood Culture adequate volume  Final   Culture   Final    NO GROWTH 3 DAYS Performed at [facility], [address], [place],  [address]    Report Status PENDING  Incomplete  Urine culture  Status: None   Collection Time: 09/03/20  4:36 PM   Specimen: Urine, Catheterized  Result Value Ref Range Status   Specimen Description   Final    URINE, CATHETERIZED Performed at [facility], [address] [place], [place] [address]    Special Requests   Final    NONE Performed at [facility], [address], [place], [place] [address]    Culture   Final    NO  GROWTH Performed at [facility], [facility] [address], [place], [place] [address]    Report Status 09/05/2020 FINAL  Final     Time coordinating discharge: 35 minutes  SIGNED:   Rodena Goldmann, DO [facility] 09/06/2020, 9:06 AM  If 7PM-7AM, please contact night-coverage [email or web address]

## 2020-09-08 LAB — CULTURE, BLOOD (ROUTINE X 2)
Culture: NO GROWTH
Culture: NO GROWTH
Special Requests: ADEQUATE
Special Requests: ADEQUATE

## 2020-09-12 DIAGNOSIS — J441 Chronic obstructive pulmonary disease with (acute) exacerbation: Secondary | ICD-10-CM | POA: Diagnosis not present

## 2020-09-12 DIAGNOSIS — B370 Candidal stomatitis: Secondary | ICD-10-CM | POA: Diagnosis not present

## 2020-09-15 ENCOUNTER — Other Ambulatory Visit: Payer: Self-pay

## 2020-09-15 ENCOUNTER — Ambulatory Visit: Payer: HMO | Admitting: Internal Medicine

## 2020-09-15 ENCOUNTER — Encounter: Payer: Self-pay | Admitting: Internal Medicine

## 2020-09-15 DIAGNOSIS — J9611 Chronic respiratory failure with hypoxia: Secondary | ICD-10-CM

## 2020-09-15 DIAGNOSIS — J449 Chronic obstructive pulmonary disease, unspecified: Secondary | ICD-10-CM | POA: Diagnosis not present

## 2020-09-15 MED ORDER — BREZTRI AEROSPHERE 160-9-4.8 MCG/ACT IN AERO: INHALATION_SPRAY | RESPIRATORY_TRACT | 11 refills | Status: DC

## 2020-09-15 MED ORDER — BREZTRI AEROSPHERE 160-9-4.8 MCG/ACT IN AERO: 2.0000 | INHALATION_SPRAY | Freq: Two times a day (BID) | RESPIRATORY_TRACT | 0 refills | Status: DC

## 2020-09-15 NOTE — Progress Notes (Signed)
Christopher Christopher, male    DOB: 22-Jul-1955     MRN: [id number]   Brief patient profile:  45 yowm MM/quit smoking 2012 with COPD GOLD Christopher Christopher 06/17/20 previously followed by Dr Christopher Christopher referred back to pulmonary clinic 12/09/2019 in [place].   History of Present Illness  12/09/2019  Pulmonary/ 1st Christopher eval/Christopher Christopher  symbicort 160/spiriva  Chief Complaint  Patient presents with  . Pulmonary Consult    Former patient of Dr Christopher Christopher.  Breathing has been worse over the past few wks. Recently treated with Doxy per PCP for COPD exacerbation. He is occ coughing up some green sputum.   flare 11/23/19  rx doxy 5/19 x 7 days  Dyspnea:  Baseline uses HC parking / MMRC3 = can't walk 100 yards even at a slow pace at a flat grade s stopping due to sob  On 2lpm  Cough: improved less purulent purulent  Sleep: wakes up coughing since onset /sometimes uses saba proair  SABA use: rarely daytime rec Continue protonix 40 mg Take 30-60 min before first meal of the day  GERD diet  Plan A = Automatic = Always=    Budesonide/formoterol (symbicort ) 2 puffs first thing in am and 12 hours later and use spiriva x 2 pffs after the morning symbicort (ok to change back to the powder after 2 weeks but will need to stop it if you start coughing  Work on inhaler technique  Plan B = Backup (to supplement plan A, not to replace it) Only use your albuterol inhaler as a rescue medication    Plan C = Crisis (instead of Plan B   Make sure you check your oxygen saturations at highest level of activity to be sure it stays over 90%   Prednisone 10 mg take  4 each am x 2 days,   2 each am x 2 days,  1 each am x 2 days and stop  For cough > mucinex dm 1200 mg every 12 hours as needed  Would wait another 2 weeks before you get your first shot for covid 19    02/03/2020  f/u ov/Christopher Christopher re: GOLD Christopher/ 02 dep/ rx symb /spriva powder  Chief Complaint  Patient presents with  . Acute Visit    shortness of breath with exertion, productive  cough with green phlegm  Dyspnea: abruptly worse x 48 h = MMRC3 = can't walk 100 yards even at a slow pace at a flat grade s stopping due to sob   Cough: worse with green mucus/ worse since changed back to dpi spiriva Sleeping: 2 am tends to cramp SABA use: none on day of ov  02: 2lpm  Up to 2.5 lpm  rec zpak Prednisone 10 mg take  4 each am x 2 days,   2 each am x 2 days,  1 each am x 2 days and stop  Change spiriva to respimat 2 puff each am  For cough mucinex dm up to 1200 mg every 12 hours and supplement hydocodone every 4 hours as needed  Get your covid vaccination asap We will call [employer] for a best fit analysis for portable 0xygen  In meantime, Make sure you check your oxygen saturations at highest level of activity to be sure it stays over 90% and adjust upward to maintain this level if needed but remember to turn it back to previous settings when you stop (to conserve your supply).    04/28/2020  f/u ov/Christopher Christopher/Christopher Christopher re: GOLD Christopher/ 02  dep maint rx spiriva/symbicort 160 Chief Complaint  Patient presents with  . Follow-up    Pt states dx with PNA on 04/17/20- treated with zpack.  He states his breathing is slowly improving since then.  He is using his albuterol inhaler 2 x per wk and he has not used neb.  Dyspnea:  [employer] x slow on 02 3lpm x 3 ailes , not checking sats   Cough: better, no more green mucus  Sleeping: 2 pillows bed is flat  SABA use: hfa couple times a week, not neb 02: 3lpm 24/7  rec Work on inhaler technique:   Make sure you check your oxygen saturations at highest level of activity to be sure it stays over 90% and adjust  02 flow upward to maintain this level if needed but remember to turn it back to previous settings when you stop (to conserve your supply).  If your medications are not affordable on your new plan bring in your drug formulary and I will pick the best option.     05/23/2020  Acutely ill ov/Christopher Christopher/Christopher Christopher re: new shaking chill  11/13 / and nasty color / new R cp  Chief Complaint  Patient presents with  . Follow-up    productive cough with green-brownish-red phlegm since saturday, shortness of breath with activity  Dyspnea: at rest  Cough: rattling/ honking cough  Sleeping: on side 2-3 pillows  SABA use: has not tried saba  02: 3lpm Pulsed  Does not have neb equipment, not sure he can get pantoprazole or symbicort  Thru his insurance company rec To ER    [name] eval 08/01/20 rec ST eval done 08/05/20: low risk asp SLP Diet Recommendations Regular solids;Thin liquid  Liquid Administration via Straw;Cup  Medication Administration Whole meds with liquid  Compensations Slow rate;Small sips/bites  Postural Changes Seated upright at 90 degrees;Remain semi-upright after after feeds/meals (Comment)      Admit date: 09/03/2020  Discharge date: 09/06/2020  Recommendations for Outpatient Follow-up:  1. Follow up with PCP in 1-2 weeks 2. Follow-up with Christopher Christopher with pulmonology as scheduled on 3/10 3. Continue on home breathing treatments as needed 4. Continue on prednisone taper as prescribed 5. Continue on azithromycin for 3 more days  Equipment/Devices: Has home nasal cannula 3-4 L  Diet recommendation: Heart Healthy/carb modified  Brief/Interim Summary: Per HPI: Christopher Christopher a 65 y.o.malewith a history of COPD, diabetes, hypertension, GERD, [place]. Patient seen for worsening shortness of breath since yesterday evening. Worse with exertion and improved with rest. Does have wheezing and coughing. He was recently diagnosed with pneumonia and septic shock and was recently released from the hospital. He has been on Augmentin for antibiotics. No other palliating or provoking factors. Does have sputum production with a lot of coughing. Describes as greenish and thick. Has been compliant with inhalers.  -Patient was admitted with acute on chronic hypoxemic respiratory failure secondary to acute COPD  exacerbation. He was recently discharged on 2/15 after treatment for septic shock related to left lower lobe pneumonia and was doing well at home until the day before admission.  He was treated with breathing treatments, antitussives, and IV steroids with significant improvement noted over the course of 3 days.  He was also kept empirically on Rocephin and azithromycin, but no significant bacterial pneumonia was suspected.  He is in stable condition for discharge and appears to be back to his usual baseline level of functioning.  He has remained on his usual 4 L nasal  cannula for oxygen support last 2 days.  Discharge Diagnoses:  Active Problems:   Non-alcoholic fatty liver disease   GERD (gastroesophageal reflux disease)   HTN (hypertension)   Type 2 diabetes mellitus without complication, without long-term current use of insulin ([name])   COPD with acute exacerbation ([place])   Community acquired pneumonia of left lower lobe of lung   Acute on chronic respiratory failure ([place])  Principal discharge diagnosis: Acute on chronic hypoxemic respiratory failure secondary to COPD exacerbation.       09/15/2020  f/u ov/[place] Christopher/Swati Granberry re:  Christopher Christopher Chief Complaint  Patient presents with  . Followup     Breathing has improved since the last visit here. He states he was hospitalized recently for COPD flare. He is using his albuterol inhaler 5 x per wk on average and neb 1-2 x per wk.  Dyspnea:  150 ft and flat / can do s stopping / s 02  Cough: minimal/  Sleeping: on 3 pillows  SABA JQB:HALP using much less  02: 3lpm hs and at rest but not with activity  Covid status: x 2 last never got 3 booster Lung cancer screening: n/a   No obvious day to day or daytime variability or assoc excess/ purulent sputum or mucus plugs or hemoptysis or cp or chest tightness, subjective wheeze or overt sinus or hb symptoms.   Sleeping without nocturnal  or early am exacerbation  of respiratory  c/o's or need  for noct saba. Also denies any obvious fluctuation of symptoms with weather or environmental changes or other aggravating or alleviating factors except as outlined above   No unusual exposure hx or h/o childhood pna/ asthma or knowledge of premature birth.  Current Allergies, Complete Past Medical History, Past Surgical History, Family History, and Social History were reviewed in [employer] record.  ROS  The following are not active complaints unless bolded Hoarseness, sore throat, dysphagia, dental problems, itching, sneezing,  nasal congestion or discharge of excess mucus or purulent secretions, ear ache,   fever, chills, sweats, unintended wt loss or wt gain, classically pleuritic or exertional cp,  orthopnea pnd or arm/hand swelling  or leg swelling, presyncope, palpitations, abdominal pain, anorexia, nausea, vomiting, diarrhea  or change in bowel habits or change in bladder habits, change in stools or change in urine, dysuria, hematuria,  rash, arthralgias, visual complaints, headache, numbness, weakness or ataxia or problems with walking or coordination,  change in mood or  memory.        Current Meds  Medication Sig  . acetaminophen (TYLENOL) 500 MG tablet Take 1,000 mg by mouth daily as needed for headache.   . albuterol (PROVENTIL) (2.5 MG/3ML) 0.083% nebulizer solution Take 3 mLs (2.5 mg total) by nebulization every 2 (two) hours as needed for wheezing or shortness of breath (cough).  . ALPRAZolam (XANAX) 0.25 MG tablet Take 0.25 mg by mouth 2 (two) times daily as needed for sleep.   [name] aspirin EC 81 MG tablet Take 1 tablet (81 mg total) by mouth daily with breakfast.  . bisoprolol (ZEBETA) 5 MG tablet Take 1 tablet (5 mg total) by mouth daily.  . budesonide-formoterol (SYMBICORT) 160-4.5 MCG/ACT inhaler Inhale 2 puffs into the lungs 2 (two) times daily.  . Cholecalciferol (VITAMIN D3) 25 MCG (1000 UT) CAPS Take 1 capsule by mouth daily.  . [employer] VITAMIN C 250 MG  tablet Take 250 mg by mouth daily.  . furosemide (LASIX) 20 MG tablet Take 20 mg by mouth  daily.  . gabapentin (NEURONTIN) 300 MG capsule Take 1 capsule (300 mg total) by mouth at bedtime.  [name] glimepiride (AMARYL) 2 MG tablet Take 1 tablet (2 mg total) by mouth daily with breakfast. Please do NOT take if you skip breakfast  . guaiFENesin (MUCINEX) 600 MG 12 hr tablet Take 1 tablet (600 mg total) by mouth 2 (two) times daily.  [name] guaiFENesin-dextromethorphan (ROBITUSSIN DM) 100-10 MG/5ML syrup Take 5 mLs by mouth every 4 (four) hours as needed for cough (chest congestion).  [name] JARDIANCE 25 MG TABS tablet Take 25 mg by mouth every evening.   . Multiple Vitamin (MULTIVITAMIN WITH MINERALS) TABS tablet Take 1 tablet by mouth at bedtime.  Glory Rosebush DELICA LANCETS 69S MISC   . ONETOUCH VERIO test strip   . OXYGEN Inhale 3 L into the lungs.  . pantoprazole (PROTONIX) 40 MG tablet Take 1 tablet (40 mg total) by mouth daily.  . potassium chloride (MICRO-K) 10 MEQ CR capsule Take 10 mEq by mouth daily.  [name] PROAIR HFA 108 (90 Base) MCG/ACT inhaler Inhale 2 puffs into the lungs every 4 (four) hours as needed for wheezing or shortness of breath.  . sertraline (ZOLOFT) 50 MG tablet Take 1 tablet (50 mg total) by mouth daily.  . SitaGLIPtin-MetFORMIN HCl ([phone] MG TB24 Take 1 tablet by mouth daily.  . sodium chloride (OCEAN) 0.65 % SOLN nasal spray Place 1 spray into both nostrils as needed for congestion.  . tamsulosin (FLOMAX) 0.4 MG CAPS capsule Take 0.4 mg by mouth daily.  [name] thiamine 100 MG tablet Take 1 tablet (100 mg total) by mouth daily.                          Past Medical History:  Diagnosis Date  . Anxiety   . Asthma   . Chest pain    a. 2003 Cath: nl cors.  [name] COPD (chronic obstructive pulmonary disease) ([place])   . Depression   . Diabetes mellitus   . Dyslipidemia   . Dysrhythmia   . Fracture 12/15   lower back "L-1"  . GERD (gastroesophageal reflux disease)   . Hypertension    . PONV (postoperative nausea and vomiting)   . Splenomegaly        Objective:    09/15/2020         154  05/23/2020      166 04/28/2020      166   12/30/19 162 lb (73.5 kg)  12/21/19 162 lb (73.5 kg)  12/09/19 162 lb (73.5 kg)    Vital signs reviewed  09/15/2020  - Note at rest 02 sats  98% on 3lpm pulsed  General appearance:   amb wm nad   HEENT : pt wearing mask not removed for exam due to covid -19 concerns.    NECK :  without JVD/Nodes/TM/ nl carotid upstrokes bilaterally   LUNGS: no acc muscle use,  Mod barrel  contour chest wall with bilateral  Distant bs s audible wheeze and  without cough on insp or exp maneuvers and mod  Hyperresonant  to  percussion bilaterally     CV:  RRR  no s3 or murmur or increase in P2, and  R> L pitting LE edema (at most 1+)  ABD:  soft and nontender with pos mid insp Hoover's  in the supine position. No bruits or organomegaly appreciated, bowel sounds nl  MS:     ext warm without deformities, calf  tenderness, cyanosis or clubbing No obvious joint restrictions   SKIN: warm and dry without lesions    NEURO:  alert, approp, nl sensorium with  no motor or cerebellar deficits apparent.             I personally reviewed images and agree with radiology impression as follows:  CXR:   09/03/20 1. Improved aeration of the left lung with residual less prominent patchy airspace consolidation in the mid lung field. 2. Milder peribronchial airspace consolidation throughout the right lung.     Assessment

## 2020-09-15 NOTE — Patient Instructions (Addendum)
Plan A = Automatic = Always=    breztri Take 2 puffs first thing in am and then another 2 puffs about 12 hours later.     Work on inhaler technique:  relax and gently blow all the way out then take a nice smooth deep breath back in, triggering the inhaler at same time you start breathing in.  Hold for up to 5 seconds if you can. Blow out thru nose. Rinse and gargle with water when done - remember the golfer doing the practice swings (empty symbicort)  Plan B = Backup (to supplement plan A, not to replace it) Only use your albuterol inhaler as a rescue medication to be used if you can't catch your breath by resting or doing a relaxed purse lip breathing pattern.  - The less you use it, the better it will work when you need it. - Ok to use the inhaler up to 2 puffs  every 4 hours if you must but call for appointment if use goes up over your usual need - Don't leave home without it !!  (think of it like the spare tire for your car)   Plan C = Crisis (instead of Plan B but only if Plan B stops working) - only use your albuterol nebulizer if you first try Plan B and it fails to help > ok to use the nebulizer up to every 4 hours but if start needing it regularly call for immediate appointment   Make sure you check your oxygen saturation  at your highest level of activity  to be sure it stays over 90% and adjust  02 flow upward to maintain this level if needed but remember to turn it back to previous settings when you stop (to conserve your supply).   6-8 in bed blocks under the head of the bed  Late add : ok to reduce bisoprolol to 5 mg one half daily if bp staying under 060 systolic   Please schedule a follow up visit in 3 months but call sooner if needed

## 2020-09-15 NOTE — Assessment & Plan Note (Signed)
Quit smoking 2012 - alpha one AT screen  01/31/17  MM  Level 169 - PFT's  06/18/2019  FEV1 1.12 (35 % ) ratio 0.46  p 0 % improvement from saba p ? prior to study with DLCO  11.91 (48%) corrects to 3.18 (754%)  for alv volume and FV curve classic concavity   - 12/09/2019  After extensive coaching inhaler device,  effectiveness =    90% with smi so try spiriva respimat 2 bid instead of DPI  - 09/15/2020  After extensive coaching inhaler device,  effectiveness =    75% > breztri trial    Group D in terms of symptom/risk and laba/lama/ICS  therefore appropriate rx at this point >>>  Breztri and approp saba:  I spent extra time with pt today reviewing appropriate use of albuterol for prn use on exertion with the following points: 1) saba is for relief of sob that does not improve by walking a slower pace or resting but rather if the pt does not improve after trying this first. 2) If the pt is convinced, as many are, that saba helps recover from activity faster then it's easy to tell if this is the case by re-challenging : ie stop, take the inhaler, then p 5 minutes try the exact same activity (intensity of workload) that just caused the symptoms and see if they are substantially diminished or not after saba 3) if there is an activity that reproducibly causes the symptoms, try the saba 15 min before the activity on alternate days   If in fact the saba really does help, then fine to continue to use it prn but advised may need to look closer at the maintenance regimen being used to achieve better control of airways disease with exertion.

## 2020-09-15 NOTE — Assessment & Plan Note (Addendum)
Started on 02 around 2019 As of 02/03/2020  2lpm 24/7  -  02/03/2020 referred to [employer] for Best fit for amb 02 > 3lpm pulsed tank -  04/28/2020   Walked 3lpm pulsed tank  approx   400 ft  @ moderate pace  stopped due to  Sob/tired  with sats still 95%    -  09/15/2020   Walked 3lpm cont approx   200 ft  @ slow/slt undsteady  pace  stopped due to  Sob/fatigue   With sats 91%  - at rest sats were 86% RA   Clearly needs 02 3lpm cont  with walking or titrate to >90% if needs more reviewed  Each maintenance medication was reviewed in detail including emphasizing most importantly the difference between maintenance and prns and under what circumstances the prns are to be triggered using an action plan format where appropriate.  Total time for H and P, chart review, counseling, reviewing hfa device(s) , directly observing portions of ambulatory 02 saturation study/ and generating customized AVS unique to this office visit / same day charting = > 30 min

## 2020-09-21 ENCOUNTER — Inpatient Hospital Stay: Payer: HMO | Admitting: Pulmonary Disease

## 2020-09-21 DIAGNOSIS — J449 Chronic obstructive pulmonary disease, unspecified: Secondary | ICD-10-CM | POA: Diagnosis not present

## 2020-09-30 DIAGNOSIS — J449 Chronic obstructive pulmonary disease, unspecified: Secondary | ICD-10-CM | POA: Diagnosis not present

## 2020-10-10 NOTE — Progress Notes (Signed)
Referring Provider: Asencion Noble, MD Primary Care Physician:  Asencion Noble, MD Primary GI Physician: Dr. [name]  Chief Complaint  Patient presents with  . Gastroesophageal Reflux    Doing ok  . NASH    HPI:   [name] is a 65 y.o. male presenting today to discuss scheduling EGD.  History significant for GERD, biopsy-proven [place] fibrosis, elastography F2/F3, chronic thrombocytopenia following with hematology, splenomegaly, portal gastropathy. Last EGD in 2018 with portal gastropathy, was due for repeat in 2020. Last colonoscopy in 2018, due again in 2023.  Most recent abdominal ultrasound 07/09/2020 with dense heterogenous hepatic parenchyma, splenomegaly.  Patient was last seen in our office 07/14/2020.  GERD well controlled on Protonix 40 mg daily.  Occasional solid food dysphagia occurring about once a week.  Noted lower extremity edema which began just after hospitalization in December.  He was started on furosemide in December and took 40 mg daily for 2 to 3 days then decrease to 20 mg daily with lower extremity edema essentially resolved.  No abdominal swelling, change in mental status, or overt GI bleeding.  Thought he had completed hep A/B vaccination with [facility] in [place].  He had recently been hospitalized in December with COPD exacerbation/acute respiratory failure with hypoxia.  Breathing was improving but not back to baseline.  He missed his follow-up with Dr. Melvyn Novas.  Plan to update cirrhosis labs, abdominal ultrasound, continue furosemide 20 mg daily, 2 g sodium diet, reach out to [facility] in [place] to follow-up on hep A/B vaccination, continue Protonix 40 mg daily.  He was overdue for EGD for variceal screening, but as his respiratory status has not returned to baseline, recommended following up with Dr. Melvyn Novas and we would see him back in 3 months to discuss scheduling EGD at that time.  According to [facility] records, patient received hep A and B vaccine on 08/05/2017.   Recommended updating hep a and B immunity status.  If no immunity, would restart series. Labs not completed.   Labs completed 1/11 and 07/22/2020 and stable.  Continued with mild thrombocytopenia with platelets 113.  LFTs, INR, AFP within normal limits. MELD 6.   Patient was admitted 2/10-2/15 with community-acquired pneumonia, sepsis, acute and chronic respiratory failure with hypoxia.  Again admitted 2/26-3/1 with community-acquired pneumonia, acute respiratory distress, COPD exacerbation.  He has had follow-up with Dr. Melvyn Novas on 09/15/2020.  Today: Since hospitalization, still feels a little weak. Breathing is close to baseline. On 3-4 L O2 chronically. With the O2, he feels ok. Without O2, he will get short of breath if walking very far. Intermittent cough. No fever or chills. Some discomfort in his chest if he takes his O2 off too long. This is chronic and unchanged.   GERD: Well controlled on Protonix 40 mg daily. No N/V. No abdominal pain.   Dysphagia: Solid foods get hung in his upper esophagus intermittently. Some regurgitation at times. Occurring about once a week. No trouble with soft foods. He can get choked on water at times like it goes down the wrong way.   Cirrhosis: Intermittent swelling in his legs. Sometimes he notices his abdomen will get tight, but thinks this may be because he eats or drinks too much. Other times, his abdomen is soft. He does eat ready made dinners and loves hot dogs. Doesn't salt his foods. No confusion. Occasionally forgets what he is saying, but this hasn't changed. Bruises easily. No bleeding. Denies brbpr or melena.   No constipation or diarrhea  typically. Bowels move well every day. Eats a lot of fruit.    Past Medical History:  Diagnosis Date  . Anxiety   . Asthma   . Chest pain    a. 2003 Cath: nl cors.  [name] COPD (chronic obstructive pulmonary disease) ([place])    chronically on 3-4L [place]  . Depression   . Diabetes mellitus   . Dyslipidemia   .  Dysrhythmia   . Fracture 12/15   lower back "L-1"  . GERD (gastroesophageal reflux disease)   . Hypertension   . NASH (nonalcoholic steatohepatitis) 2018   [name], undergoing routine cirrhosis care  . PONV (postoperative nausea and vomiting)   . Splenomegaly     Past Surgical History:  Procedure Laterality Date  . BIOPSY  07/26/2016   Procedure: BIOPSY;  Surgeon: [name], MD;  Location[facility];  Service: Endoscopy;;  gastric  . CARDIAC CATHETERIZATION  10/22/2001   normal coronary arteriea (Dr. Domenic Moras)  . COLONOSCOPY  08/31/2011   [facility]:[id number] rectal and colon polyps-treated. Single tubular adenoma. Next TCS 08/2016  . COLONOSCOPY N/A 07/26/2016   Dr. [name]: diverticulosis in sigmoid and descending colon. 6 mm benign splenic flexure. surveillance 5 yeras  . ESOPHAGOGASTRODUODENOSCOPY N/A 07/26/2016   Dr. [name]: normal esophagus, portal gastropathy, chronic gastritis, normal duodenum, screening EGD 2 years   . ESOPHAGOGASTRODUODENOSCOPY (EGD) WITH ESOPHAGEAL DILATION N/A 05/15/2013   FFM:[id number] esophagus-status post Venia Minks dilation/Portal gastropathy. Antral erosions-status post biopsy. extrinsic compression along lesser curvature likely secondary to splenomegaly. gastric bx benign  . HERNIA REPAIR  03/2009  . POLYPECTOMY  07/26/2016   Procedure: POLYPECTOMY;  Surgeon: [name], MD;  Location: [facility];  Service: Endoscopy;;  colon  . TRANSTHORACIC ECHOCARDIOGRAM  2011   EF 99-35%, stage 1 diastolic dysfunction, trace TR & pulm valve regurg   . VASECTOMY  1989    Current Outpatient Medications  Medication Sig Dispense Refill  . acetaminophen (TYLENOL) 500 MG tablet Take 1,000 mg by mouth daily as needed for headache.     . albuterol (PROVENTIL) (2.5 MG/3ML) 0.083% nebulizer solution Take 3 mLs (2.5 mg total) by nebulization every 2 (two) hours as needed for wheezing or shortness of breath (cough). 75 mL 1  . ALPRAZolam (XANAX) 0.25 MG tablet Take 0.25 mg  by mouth 2 (two) times daily as needed for sleep.     [name] aspirin EC 81 MG tablet Take 1 tablet (81 mg total) by mouth daily with breakfast. 30 tablet 5  . atorvastatin (LIPITOR) 40 MG tablet Take 40 mg by mouth daily.    . budesonide-formoterol (SYMBICORT) 160-4.5 MCG/ACT inhaler Inhale 2 puffs into the lungs 2 (two) times daily.    . Cholecalciferol (VITAMIN D3) 25 MCG (1000 UT) CAPS Take 1 capsule by mouth daily.    . CVS VITAMIN C 250 MG tablet Take 250 mg by mouth daily.    [name] diltiazem (DILACOR XR) 240 MG 24 hr capsule Take 240 mg by mouth daily.    [name] gabapentin (NEURONTIN) 300 MG capsule Take 1 capsule (300 mg total) by mouth at bedtime. 30 capsule 5  . glimepiride (AMARYL) 1 MG tablet Take 1 mg by mouth daily with breakfast.    . guaiFENesin (MUCINEX) 600 MG 12 hr tablet Take 1 tablet (600 mg total) by mouth 2 (two) times daily. (Patient taking differently: Take 600 mg by mouth 2 (two) times daily as needed.) 20 tablet 0  . guaiFENesin-dextromethorphan (ROBITUSSIN DM) 100-10 MG/5ML syrup  Take 5 mLs by mouth every 4 (four) hours as needed for cough (chest congestion). 118 mL 0  . JARDIANCE 25 MG TABS tablet Take 25 mg by mouth every evening.   3  . Multiple Vitamin (MULTIVITAMIN WITH MINERALS) TABS tablet Take 1 tablet by mouth at bedtime. 30 tablet 2  . ONETOUCH DELICA LANCETS 71G MISC     . ONETOUCH VERIO test strip     . OXYGEN Inhale 4 L into the lungs. 3-4 L    . pantoprazole (PROTONIX) 40 MG tablet Take 1 tablet (40 mg total) by mouth daily. 90 tablet 3  . PROAIR HFA 108 (90 Base) MCG/ACT inhaler Inhale 2 puffs into the lungs every 4 (four) hours as needed for wheezing or shortness of breath. 18 g 12  . sertraline (ZOLOFT) 50 MG tablet Take 1 tablet (50 mg total) by mouth daily. 30 tablet 3  . SitaGLIPtin-MetFORMIN HCl 5412585880 MG TB24 Take 1 tablet by mouth daily.    . sodium chloride (OCEAN) 0.65 % SOLN nasal spray Place 1 spray into both nostrils as needed for congestion. 60 mL 0  .  tamsulosin (FLOMAX) 0.4 MG CAPS capsule Take 0.4 mg by mouth daily.    [name] thiamine 100 MG tablet Take 1 tablet (100 mg total) by mouth daily. 30 tablet 2  . Tiotropium Bromide Monohydrate (SPIRIVA RESPIMAT) 2.5 MCG/ACT AERS Inhale 2 puffs into the lungs.    . bisoprolol (ZEBETA) 5 MG tablet Take 1 tablet (5 mg total) by mouth daily. 30 tablet 0   No current facility-administered medications for this visit.    Allergies as of 10/12/2020 - Review Complete 10/12/2020  Allergen Reaction Noted  . Baclofen Other (See Comments) 07/24/2016  . Simvastatin Nausea Only 10/05/2013    Family History  Problem Relation Age of Onset  . Heart disease Mother   . Cancer Mother        lymph nodes  . Diabetes Mother   . Arthritis Other   . Lung disease Other   . Asthma Other   . Kidney disease Other   . Ovarian cancer Sister   . Diabetes Brother   . Heart disease Brother   . Hypertension Brother   . Diabetes Sister   . Diabetes Sister   . Cirrhosis Sister 12       no etoh  . Colon cancer Neg Hx     Social History   Socioeconomic History  . Marital status: Married    Spouse name: Not on file  . Number of children: Not on file  . Years of education: Not on file  . Highest education level: Not on file  Occupational History  . Not on file  Tobacco Use  . Smoking status: Former Smoker    Packs/day: 1.25    Years: 36.00    Pack years: 45.00    Types: Cigarettes    Quit date: 04/2011    Years since quitting: 9.5  . Smokeless tobacco: Former Network engineer  . Vaping Use: Never used  Substance and Sexual Activity  . Alcohol use: No  . Drug use: No  . Sexual activity: Not on file  Other Topics Concern  . Not on file  Social History Narrative  . Not on file   Social Determinants of Health   Financial Resource Strain: Low Risk   . Difficulty of Paying Living Expenses: Not hard at all  Food Insecurity: No Food Insecurity  . Worried About [employer] of  Food in the Last Year:  Never true  . Ran Out of Food in the Last Year: Never true  Transportation Needs: No Transportation Needs  . Lack of Transportation (Medical): No  . Lack of Transportation (Non-Medical): No  Physical Activity: Sufficiently Active  . Days of Exercise per Week: 7 days  . Minutes of Exercise per Session: 30 min  Stress: No Stress Concern Present  . Feeling of Stress : Not at all  Social Connections: Moderately Integrated  . Frequency of Communication with Friends and Family: Three times a week  . Frequency of Social Gatherings with Friends and Family: Three times a week  . Attends Religious Services: More than 4 times per year  . Active Member of Clubs or Organizations: No  . Attends Archivist Meetings: Never  . Marital Status: Married    Review of Systems: Gen: Denies fever, chills, presyncope, syncope. CV: See HPI Resp: See HPI GI: See HPI Heme: See HPI  Physical Exam: BP 105/61   Pulse (!) 102   Temp (!) 97.3 F (36.3 C) (Temporal)   Ht _0  (1.702 m)   Wt 157 lb 3.2 oz (71.3 kg)   BMI 24.62 kg/m  General:   Alert and oriented. No distress noted. Pleasant and cooperative.  Nasal cannula in place. Head:  Normocephalic and atraumatic. Eyes:  Conjuctiva clear without scleral icterus. Heart:  S1, S2 present without murmurs appreciated. Lungs:  Slight scattered crackles. No wheezes, rales, or rhonchi. No distress.  Abdomen:  +BS. Abdomen is round but soft and non-tender. No rebound or guarding. No HSM or masses noted. Msk:  Symmetrical without gross deformities. Normal posture. Extremities:  Without edema. Neurologic:  Alert and  oriented x4 Psych: Normal mood and affect.   Assessment: 65 year old male with history of COPD, diabetes, HTN, anxiety/depression, biopsy-proven NASH fibrosis with F2/F3 undergoing routine cirrhosis care, splenomegaly, thrombocytopenia, and portal gastropathy presenting today for follow-up.  Nash fibrosis: Meld 8 based on labs in  January 2022.  Remains fairly well compensated.  He does report intermittent swelling in his lower extremities and occasionally "tight" abdomen.  On exam today, he has trace bilateral lower extremity edema.  Abdomen is round but very soft. Notably, he does eat frozen dinners and hotdogs routinely. No encephalopathy. [place] and labs are up to date. It is unclear if patient has completed hep A/B vaccine; series was started in 2019.  He is overdue for esophageal varices screening.  Last EGD in January 2018 with normal esophagus, portal gastropathy.    Discussed starting low dose diuretics, but patient prefers to hold off for now.  Counseled extensively on 2 g sodium diet.  Recommended he keep a dietary log for the next week to see how much sodium he is consuming each day and make adjustments as needed. We will also arrange for an EGD and evaluate for hep A/B immunity. Plan to see him back in 3 months to ensure peripheral edema is not progressing.    GERD: Well-controlled on Protonix 40 mg daily.  Advised to continue his current medications.  Dysphagia: Solid food dysphagia occurring about once a week.  This does require food regurgitation at times.  Occasionally, he also feels liquids may go down the wrong way.  Recent modified barium swallow with oropharyngeal swallowing within functional limits.  He had 2 episodes of flash penetration when drinking thin liquids suspected to be a normal difference in swallowing.  Notably, he has responded well to empiric dilation in the past.  We will plan to proceed with EGD with possible dilation.   Plan:  1.  Hep A antibody total, hep B surface antibody.  2.  EGD +/- dilation with propofol/MAC with Dr. [name] in the near future. The risks, benefits, and alternatives have been discussed with the patient in detail. The patient states understanding and desires to proceed.  ASA III/IV Propofol/MAC due to medical history.  No AM diabetes medications morning of procedure.    3.  Continue Protonix 40 mg daily.  4.  Dysphagia precautions:  Avoid tough textures, meats should be chopped finely.  Eat slowly, take small bites, chew thoroughly, drink plenty of liquids throughout meals.  If something were to get hung in his esophagus and not come up or go down, he was advised to proceed to the emergency room.  5.  Counseled extensively on 2 g sodium diet.  Recommended patient keep a dietary log for 1 week to try, sodium he is taking in and make adjustments as needed.  6.  Advised to monitor for increasing swelling in legs or abdomen and let us know if this occurs.  7.  Also advised to monitor for bright red blood per rectum, melena, change in mental status or confusion and let us know if this occurs.  8.  Follow-up in 3 months or sooner if needed.    Aliene Altes, PA-C [facility] 10/12/2020

## 2020-10-12 ENCOUNTER — Ambulatory Visit (INDEPENDENT_AMBULATORY_CARE_PROVIDER_SITE_OTHER): Payer: HMO | Admitting: Gastroenterology

## 2020-10-12 ENCOUNTER — Encounter: Payer: Self-pay | Admitting: Gastroenterology

## 2020-10-12 ENCOUNTER — Other Ambulatory Visit: Payer: Self-pay

## 2020-10-12 VITALS — BP 105/61 | HR 102 | Temp 97.3°F | Ht 67.0 in | Wt 157.2 lb

## 2020-10-12 DIAGNOSIS — K219 Gastro-esophageal reflux disease without esophagitis: Secondary | ICD-10-CM | POA: Diagnosis not present

## 2020-10-12 DIAGNOSIS — K7400 Hepatic fibrosis, unspecified: Secondary | ICD-10-CM | POA: Diagnosis not present

## 2020-10-12 DIAGNOSIS — K7581 Nonalcoholic steatohepatitis (NASH): Secondary | ICD-10-CM | POA: Diagnosis not present

## 2020-10-12 DIAGNOSIS — R1310 Dysphagia, unspecified: Secondary | ICD-10-CM | POA: Insufficient documentation

## 2020-10-12 NOTE — Patient Instructions (Signed)
Please have blood work completed at [facility].  We will arrange for you to have an upper endoscopy with possible dilation in the near future with Dr. [name]. Morning after procedure, do not take any diabetes medications.  If you have any changes in your breathing, please let us know.  Continue taking Protonix 40 mg daily for acid reflux.  To help with your trouble swallowing: Avoid tough textures. Meats should be chopped finely. Eat slowly, take small bites, chew thoroughly, drink plenty of liquids throughout your meals. If something gets hung in your esophagus and would not come up or go down, you should proceed to the emergency room.  For cirrhosis: - As we discussed, it is very important that you follow a strict low-sodium diet.  No more than 2000 mg of sodium per day. - Please look at the labels of all foods and liquids consumed.  Keep a dietary log for 1 week to track how much sodium you are taking in each day.  If it is more than 2000 mg, you will need to adjust your current diet. - Monitor for increased swelling in your legs or abdomen and let me know if this occurs. - Monitor for bright red blood per rectum, black stools, or change in mental status/confusion and let me know if this occurs.  We will plan to see back in 3 months.  Please call if you have any questions or concerns prior.   Aliene Altes, PA-C [facility]

## 2020-10-14 DIAGNOSIS — K7400 Hepatic fibrosis, unspecified: Secondary | ICD-10-CM | POA: Diagnosis not present

## 2020-10-14 DIAGNOSIS — K7581 Nonalcoholic steatohepatitis (NASH): Secondary | ICD-10-CM | POA: Diagnosis not present

## 2020-10-15 LAB — HEPATITIS B SURFACE ANTIBODY,QUALITATIVE: Hep B Surface Ab, Qual: NONREACTIVE

## 2020-10-15 LAB — HEPATITIS A ANTIBODY, TOTAL: hep A Total Ab: NEGATIVE

## 2020-10-22 DIAGNOSIS — J449 Chronic obstructive pulmonary disease, unspecified: Secondary | ICD-10-CM | POA: Diagnosis not present

## 2020-10-24 ENCOUNTER — Telehealth: Payer: Self-pay | Admitting: *Deleted

## 2020-10-24 NOTE — Telephone Encounter (Signed)
Called pt to schedule EGD +/-dil with propofol, Dr. [name], ASA 3/4, LMOVM to call back

## 2020-10-24 NOTE — Telephone Encounter (Signed)
Patient returned call. He has been scheduled for 6/6 at 9:00am. Aware will mail prep instructions with covid/pre-op appt. Confirmed address.

## 2020-10-25 ENCOUNTER — Encounter: Payer: Self-pay | Admitting: *Deleted

## 2020-11-22 DIAGNOSIS — Z79899 Other long term (current) drug therapy: Secondary | ICD-10-CM | POA: Diagnosis not present

## 2020-11-22 DIAGNOSIS — K219 Gastro-esophageal reflux disease without esophagitis: Secondary | ICD-10-CM | POA: Diagnosis not present

## 2020-11-22 DIAGNOSIS — K760 Fatty (change of) liver, not elsewhere classified: Secondary | ICD-10-CM | POA: Diagnosis not present

## 2020-11-22 DIAGNOSIS — E785 Hyperlipidemia, unspecified: Secondary | ICD-10-CM | POA: Diagnosis not present

## 2020-11-22 DIAGNOSIS — E1140 Type 2 diabetes mellitus with diabetic neuropathy, unspecified: Secondary | ICD-10-CM | POA: Diagnosis not present

## 2020-11-22 DIAGNOSIS — G9009 Other idiopathic peripheral autonomic neuropathy: Secondary | ICD-10-CM | POA: Diagnosis not present

## 2020-11-22 DIAGNOSIS — I10 Essential (primary) hypertension: Secondary | ICD-10-CM | POA: Diagnosis not present

## 2020-11-22 DIAGNOSIS — J9611 Chronic respiratory failure with hypoxia: Secondary | ICD-10-CM | POA: Diagnosis not present

## 2020-11-29 DIAGNOSIS — D696 Thrombocytopenia, unspecified: Secondary | ICD-10-CM | POA: Diagnosis not present

## 2020-11-29 DIAGNOSIS — J019 Acute sinusitis, unspecified: Secondary | ICD-10-CM | POA: Diagnosis not present

## 2020-11-29 DIAGNOSIS — E1140 Type 2 diabetes mellitus with diabetic neuropathy, unspecified: Secondary | ICD-10-CM | POA: Diagnosis not present

## 2020-11-29 DIAGNOSIS — J9611 Chronic respiratory failure with hypoxia: Secondary | ICD-10-CM | POA: Diagnosis not present

## 2020-11-29 DIAGNOSIS — R7309 Other abnormal glucose: Secondary | ICD-10-CM | POA: Diagnosis not present

## 2020-12-07 NOTE — Patient Instructions (Signed)
[name]  12/07/2020     @PREFPERIOPPHARMACY @   Your procedure is scheduled on 12/12/2020.   Report to [facility] at  0700  A.M.   Call this number if you have problems the morning of surgery:  [phone]   Remember:  Follow the diet instructions given to you by the office.                       Take these medicines the morning of surgery with A SIP OF WATER   Xanax(if needed), diltiazem, protonix, zoloft, flomax.  Use your nebulizer and your inhalers before you come and bring your rescue inhaler with you.  DO NOT take any medications for diabetes the morning of your procedure.     Please brush your teeth.  Do not wear jewelry, make-up or nail polish.  Do not wear lotions, powders, or perfumes, or deodorant.  Do not shave 48 hours prior to surgery.  Men may shave face and neck.  Do not bring valuables to the hospital.  [facility] is not responsible for any belongings or valuables.  Contacts, dentures or bridgework may not be worn into surgery.  Leave your suitcase in the car.  After surgery it may be brought to your room.  For patients admitted to the hospital, discharge time will be determined by your treatment team.  Patients discharged the day of surgery will not be allowed to drive home and must have someone with them for 24 hours.    Special instructions:  DO NOT smoke tobacco or vape for 24 hours before your procedure.  Please read over the following fact sheets that you were given. Anesthesia Post-op Instructions and Care and Recovery After Surgery       Upper Endoscopy, Adult, Care After This sheet gives you information about how to care for yourself after your procedure. Your health care provider may also give you more specific instructions. If you have problems or questions, contact your health care provider. What can I expect after the procedure? After the procedure, it is common to have:  A sore throat.  Mild stomach pain or  discomfort.  Bloating.  Nausea. Follow these instructions at home:  Follow instructions from your health care provider about what to eat or drink after your procedure.  Return to your normal activities as told by your health care provider. Ask your health care provider what activities are safe for you.  Take over-the-counter and prescription medicines only as told by your health care provider.  If you were given a sedative during the procedure, it can affect you for several hours. Do not drive or operate machinery until your health care provider says that it is safe.  Keep all follow-up visits as told by your health care provider. This is important.   Contact a health care provider if you have:  A sore throat that lasts longer than one day.  Trouble swallowing. Get help right away if:  You vomit blood or your vomit looks like coffee grounds.  You have: ? A fever. ? Bloody, black, or tarry stools. ? A severe sore throat or you cannot swallow. ? Difficulty breathing. ? Severe pain in your chest or abdomen. Summary  After the procedure, it is common to have a sore throat, mild stomach discomfort, bloating, and nausea.  If you were given a sedative during the procedure, it can affect you for several hours.  Do not drive or operate machinery until your health care provider says that it is safe.  Follow instructions from your health care provider about what to eat or drink after your procedure.  Return to your normal activities as told by your health care provider. This information is not intended to replace advice given to you by your health care provider. Make sure you discuss any questions you have with your health care provider. Document Revised: 06/23/2019 Document Reviewed: 11/25/2017 Elsevier Patient Education  2021 [place].  [email or web address].asge.org/home/for-patients/patient-information/understanding-eso-dilation-updated">  Esophageal Dilatation Esophageal dilatation,  also called esophageal dilation, is a procedure to widen or open a blocked or narrowed part of the esophagus. The esophagus is the part of the body that moves food and liquid from the mouth to the stomach. You may need this procedure if:  You have a buildup of scar tissue in your esophagus that makes it difficult, painful, or impossible to swallow. This can be caused by gastroesophageal reflux disease (GERD).  You have cancer of the esophagus.  There is a problem with how food moves through your esophagus. In some cases, you may need this procedure repeated at a later time to dilate the esophagus gradually. Tell a health care provider about:  Any allergies you have.  All medicines you are taking, including vitamins, herbs, eye drops, creams, and over-the-counter medicines.  Any problems you or family members have had with anesthetic medicines.  Any blood disorders you have.  Any surgeries you have had.  Any medical conditions you have.  Any antibiotic medicines you are required to take before dental procedures.  Whether you are pregnant or may be pregnant. What are the risks? Generally, this is a safe procedure. However, problems may occur, including:  Bleeding due to a tear in the lining of the esophagus.  A hole, or perforation, in the esophagus. What happens before the procedure?  Ask your health care provider about: ? Changing or stopping your regular medicines. This is especially important if you are taking diabetes medicines or blood thinners. ? Taking medicines such as aspirin and ibuprofen. These medicines can thin your blood. Do not take these medicines unless your health care provider tells you to take them. ? Taking over-the-counter medicines, vitamins, herbs, and supplements.  Follow instructions from your health care provider about eating or drinking restrictions.  Plan to have a responsible adult take you home from the hospital or clinic.  Plan to have a  responsible adult care for you for the time you are told after you leave the hospital or clinic. This is important. What happens during the procedure?  You may be given a medicine to help you relax (sedative).  A numbing medicine may be sprayed into the back of your throat, or you may gargle the medicine.  Your health care provider may perform the dilatation using various surgical instruments, such as: ? Simple dilators. This instrument is carefully placed in the esophagus to stretch it. ? Guided wire bougies. This involves using an endoscope to insert a wire into the esophagus. A dilator is passed over this wire to enlarge the esophagus. Then the wire is removed. ? Balloon dilators. An endoscope with a small balloon is inserted into the esophagus. The balloon is inflated to stretch the esophagus and open it up. The procedure may vary among health care providers and hospitals. What can I expect after the procedure?  Your blood pressure, heart rate, breathing rate, and blood oxygen level will be monitored until you leave  the hospital or clinic.  Your throat may feel slightly sore and numb. This will get better over time.  You will not be allowed to eat or drink until your throat is no longer numb.  When you are able to drink, urinate, and sit on the edge of the bed without nausea or dizziness, you may be able to return home. Follow these instructions at home:  Take over-the-counter and prescription medicines only as told by your health care provider.  If you were given a sedative during the procedure, it can affect you for several hours. Do not drive or operate machinery until your health care provider says that it is safe.  Plan to have a responsible adult care for you for the time you are told. This is important.  Follow instructions from your health care provider about any eating or drinking restrictions.  Do not use any products that contain nicotine or tobacco, such as cigarettes,  e-cigarettes, and chewing tobacco. If you need help quitting, ask your health care provider.  Keep all follow-up visits. This is important. Contact a health care provider if:  You have a fever.  You have pain that is not relieved by medicine. Get help right away if:  You have chest pain.  You have trouble breathing.  You have trouble swallowing.  You vomit blood.  You have black, tarry, or bloody stools. These symptoms may represent a serious problem that is an emergency. Do not wait to see if the symptoms will go away. Get medical help right away. Call your local emergency services (911 in the [place] Do not drive yourself to the hospital. Summary  Esophageal dilatation, also called esophageal dilation, is a procedure to widen or open a blocked or narrowed part of the esophagus.  Plan to have a responsible adult take you home from the hospital or clinic.  For this procedure, a numbing medicine may be sprayed into the back of your throat, or you may gargle the medicine.  Do not drive or operate machinery until your health care provider says that it is safe. This information is not intended to replace advice given to you by your health care provider. Make sure you discuss any questions you have with your health care provider. Document Revised: 11/11/2019 Document Reviewed: 11/11/2019 Elsevier Patient Education  2021 [place] After This sheet gives you information about how to care for yourself after your procedure. Your health care provider may also give you more specific instructions. If you have problems or questions, contact your health care provider. What can I expect after the procedure? After the procedure, it is common to have:  Tiredness.  Forgetfulness about what happened after the procedure.  Impaired judgment for important decisions.  Nausea or vomiting.  Some difficulty with balance. Follow these instructions at  home: For the time period you were told by your health care provider:  Rest as needed.  Do not participate in activities where you could fall or become injured.  Do not drive or use machinery.  Do not drink alcohol.  Do not take sleeping pills or medicines that cause drowsiness.  Do not make important decisions or sign legal documents.  Do not take care of children on your own.      Eating and drinking  Follow the diet that is recommended by your health care provider.  Drink enough fluid to keep your urine pale yellow.  If you vomit: ? Drink water, juice, or soup  when you can drink without vomiting. ? Make sure you have little or no nausea before eating solid foods. General instructions  Have a responsible adult stay with you for the time you are told. It is important to have someone help care for you until you are awake and alert.  Take over-the-counter and prescription medicines only as told by your health care provider.  If you have sleep apnea, surgery and certain medicines can increase your risk for breathing problems. Follow instructions from your health care provider about wearing your sleep device: ? Anytime you are sleeping, including during daytime naps. ? While taking prescription pain medicines, sleeping medicines, or medicines that make you drowsy.  Avoid smoking.  Keep all follow-up visits as told by your health care provider. This is important. Contact a health care provider if:  You keep feeling nauseous or you keep vomiting.  You feel light-headed.  You are still sleepy or having trouble with balance after 24 hours.  You develop a rash.  You have a fever.  You have redness or swelling around the IV site. Get help right away if:  You have trouble breathing.  You have new-onset confusion at home. Summary  For several hours after your procedure, you may feel tired. You may also be forgetful and have poor judgment.  Have a responsible adult  stay with you for the time you are told. It is important to have someone help care for you until you are awake and alert.  Rest as told. Do not drive or operate machinery. Do not drink alcohol or take sleeping pills.  Get help right away if you have trouble breathing, or if you suddenly become confused. This information is not intended to replace advice given to you by your health care provider. Make sure you discuss any questions you have with your health care provider. Document Revised: 03/10/2020 Document Reviewed: 05/28/2019 Elsevier Patient Education  2021 [employer].

## 2020-12-08 ENCOUNTER — Encounter: Payer: Self-pay | Admitting: Internal Medicine

## 2020-12-08 ENCOUNTER — Encounter (HOSPITAL_COMMUNITY): Payer: Self-pay

## 2020-12-08 ENCOUNTER — Other Ambulatory Visit: Payer: Self-pay

## 2020-12-08 ENCOUNTER — Ambulatory Visit: Payer: HMO | Admitting: Internal Medicine

## 2020-12-08 ENCOUNTER — Encounter (HOSPITAL_COMMUNITY)
Admission: RE | Admit: 2020-12-08 | Discharge: 2020-12-08 | Disposition: A | Discharge status: Home / Self Care | Payer: HMO | Source: Ambulatory Visit | Attending: Internal Medicine | Admitting: Internal Medicine

## 2020-12-08 ENCOUNTER — Other Ambulatory Visit (HOSPITAL_COMMUNITY)
Admission: RE | Admit: 2020-12-08 | Discharge: 2020-12-08 | Disposition: A | Discharge status: Home / Self Care | Payer: HMO | Source: Ambulatory Visit | Attending: Internal Medicine | Admitting: Internal Medicine

## 2020-12-08 DIAGNOSIS — J9611 Chronic respiratory failure with hypoxia: Secondary | ICD-10-CM

## 2020-12-08 DIAGNOSIS — Z01812 Encounter for preprocedural laboratory examination: Secondary | ICD-10-CM | POA: Insufficient documentation

## 2020-12-08 DIAGNOSIS — J449 Chronic obstructive pulmonary disease, unspecified: Secondary | ICD-10-CM | POA: Diagnosis not present

## 2020-12-08 LAB — COMPREHENSIVE METABOLIC PANEL
ALT: 27 U/L (ref 0–44)
AST: 20 U/L (ref 15–41)
Albumin: 4 g/dL (ref 3.5–5.0)
Alkaline Phosphatase: 95 U/L (ref 38–126)
Anion gap: 8 (ref 5–15)
BUN: 19 mg/dL (ref 8–23)
CO2: 25 mmol/L (ref 22–32)
Calcium: 9 mg/dL (ref 8.9–10.3)
Chloride: 103 mmol/L (ref 98–111)
Creatinine, Ser: 0.75 mg/dL (ref 0.61–1.24)
GFR, Estimated: >60 mL/min (ref >60)
Glucose, Bld: 243 mg/dL — ABNORMAL HIGH (ref 70–99)
Potassium: 3.8 mmol/L (ref 3.5–5.1)
Sodium: 136 mmol/L (ref 135–145)
Total Bilirubin: 0.8 mg/dL (ref 0.3–1.2)
Total Protein: 8 g/dL (ref 6.5–8.1)

## 2020-12-08 LAB — CBC WITH DIFFERENTIAL/PLATELET
Abs Immature Granulocytes: 0.03 10*3/uL (ref 0.00–0.07)
Basophils Absolute: 0.1 10*3/uL (ref 0.0–0.1)
Basophils Relative: 1 %
Eosinophils Absolute: 0.4 10*3/uL (ref 0.0–0.5)
Eosinophils Relative: 5 %
HCT: 47.4 % (ref 39.0–52.0)
Hemoglobin: 15 g/dL (ref 13.0–17.0)
Immature Granulocytes: 0 %
Lymphocytes Relative: 18 %
Lymphs Abs: 1.3 10*3/uL (ref 0.7–4.0)
MCH: 26 pg (ref 26.0–34.0)
MCHC: 31.6 g/dL (ref 30.0–36.0)
MCV: 82.3 fL (ref 80.0–100.0)
Monocytes Absolute: 0.5 10*3/uL (ref 0.1–1.0)
Monocytes Relative: 7 %
Neutro Abs: 4.6 10*3/uL (ref 1.7–7.7)
Neutrophils Relative %: 69 %
Platelets: 101 10*3/uL — ABNORMAL LOW (ref 150–400)
RBC: 5.76 MIL/uL (ref 4.22–5.81)
RDW: 15.5 % (ref 11.5–15.5)
WBC: 6.8 10*3/uL (ref 4.0–10.5)
nRBC: 0 % (ref 0.0–0.2)

## 2020-12-08 LAB — PROTIME-INR
INR: 1 (ref 0.8–1.2)
Prothrombin Time: 13.4 seconds (ref 11.4–15.2)

## 2020-12-08 NOTE — Assessment & Plan Note (Signed)
Quit smoking 2012 - alpha one AT screen  01/31/17  MM  Level 169 - PFT's  06/18/2019  FEV1 1.12 (35 % ) ratio 0.46  p 0 % improvement from saba p ? prior to study with DLCO  11.91 (48%) corrects to 3.18 (754%)  for alv volume and FV curve classic concavity   - 12/09/2019  After extensive coaching inhaler device,  effectiveness =    90% with smi so try spiriva respimat 2 bid instead of DPI  - 09/15/2020  After extensive coaching inhaler device,  effectiveness =    75% > breztri trial > preferreed sym/ spiriva smi - 12/08/2020  After extensive coaching inhaler device,  effectiveness = 80%   Group D in terms of symptom/risk and laba/lama/ICS  therefore appropriate rx at this point >>>  Ok to continue symb/spiriva smi

## 2020-12-08 NOTE — Patient Instructions (Addendum)
Plan A = Automatic = Always=    Symbicort Take 2 puffs first thing in am and then another 2 puffs about 12 hours later and spiriva respimat 2 puffs after am symbicort   Plan B = Backup (to supplement plan A, not to replace it) Only use your albuterol inhaler as a rescue medication to be used if you can't catch your breath by resting or doing a relaxed purse lip breathing pattern.  - The less you use it, the better it will work when you need it. - Ok to use the inhaler up to 2 puffs  every 4 hours if you must but call for appointment if use goes up over your usual need - Don't leave home without it !!  (think of it like the spare tire for your car)   Plan C = Crisis (instead of Plan B but only if Plan B stops working) - only use your albuterol nebulizer if you first try Plan B and it fails to help > ok to use the nebulizer up to every 4 hours but if start needing it regularly call for immediate appointment   Make sure you check your oxygen saturation  at your highest level of activity  to be sure it stays over 90% and adjust  02 flow upward to maintain this level if needed but remember to turn it back to previous settings when you stop (to conserve your supply).    Please schedule a follow up visit in 6 months but call sooner if needed

## 2020-12-08 NOTE — Progress Notes (Signed)
[name], male    DOB: August 15, 1955     MRN: [id number]   Brief patient profile:  76 yowm MM/quit smoking 2012 with COPD GOLD III criteria 06/17/20 previously followed by Dr [name] referred back to pulmonary clinic 12/09/2019 in [place].   History of Present Illness  12/09/2019  Pulmonary/ 1st office eval/[name]  symbicort 160/spiriva  Chief Complaint  Patient presents with  . Pulmonary Consult    Former patient of Dr [name].  Breathing has been worse over the past few wks. Recently treated with Doxy per PCP for COPD exacerbation. He is occ coughing up some green sputum.   flare 11/23/19  rx doxy 5/19 x 7 days  Dyspnea:  Baseline uses HC parking / MMRC3 = can't walk 100 yards even at a slow pace at a flat grade s stopping due to sob  On 2lpm  Cough: improved less purulent purulent  Sleep: wakes up coughing since onset /sometimes uses saba proair  SABA use: rarely daytime rec Continue protonix 40 mg Take 30-60 min before first meal of the day  GERD diet  Plan A = Automatic = Always=    Budesonide/formoterol (symbicort ) 2 puffs first thing in am and 12 hours later and use spiriva x 2 pffs after the morning symbicort (ok to change back to the powder after 2 weeks but will need to stop it if you start coughing  Work on inhaler technique  Plan B = Backup (to supplement plan A, not to replace it) Only use your albuterol inhaler as a rescue medication    Plan C = Crisis (instead of Plan B   Make sure you check your oxygen saturations at highest level of activity to be sure it stays over 90%   Prednisone 10 mg take  4 each am x 2 days,   2 each am x 2 days,  1 each am x 2 days and stop  For cough > mucinex dm 1200 mg every 12 hours as needed  Would wait another 2 weeks before you get your first shot for covid 19    02/03/2020  f/u ov/[name] re: GOLD III/ 02 dep/ rx symb /spriva powder  Chief Complaint  Patient presents with  . Acute Visit    shortness of breath with exertion, productive  cough with green phlegm  Dyspnea: abruptly worse x 48 h = MMRC3 = can't walk 100 yards even at a slow pace at a flat grade s stopping due to sob   Cough: worse with green mucus/ worse since changed back to dpi spiriva Sleeping: 2 am tends to cramp SABA use: none on day of ov  02: 2lpm  Up to 2.5 lpm  rec zpak Prednisone 10 mg take  4 each am x 2 days,   2 each am x 2 days,  1 each am x 2 days and stop  Change spiriva to respimat 2 puff each am  For cough mucinex dm up to 1200 mg every 12 hours and supplement hydocodone every 4 hours as needed  Get your covid vaccination asap We will call [employer] for a best fit analysis for portable 0xygen  In meantime, Make sure you check your oxygen saturations at highest level of activity to be sure it stays over 90% and adjust upward to maintain this level if needed but remember to turn it back to previous settings when you stop (to conserve your supply).    04/28/2020  f/u ov/[place] office/[name] re: GOLD III/ 02 dep  maint rx spiriva/symbicort 160 Chief Complaint  Patient presents with  . Follow-up    Pt states dx with PNA on 04/17/20- treated with zpack.  He states his breathing is slowly improving since then.  He is using his albuterol inhaler 2 x per wk and he has not used neb.  Dyspnea:  [employer] x slow on 02 3lpm x 3 ailes , not checking sats   Cough: better, no more green mucus  Sleeping: 2 pillows bed is flat  SABA use: hfa couple times a week, not neb 02: 3lpm 24/7  rec Work on inhaler technique:   Make sure you check your oxygen saturations at highest level of activity to be sure it stays over 90% and adjust  02 flow upward to maintain this level if needed but remember to turn it back to previous settings when you stop (to conserve your supply).  If your medications are not affordable on your new plan bring in your drug formulary and I will pick the best option.     05/23/2020  Acutely ill ov/[name] office/[name] re: new shaking chill  11/13 / and nasty color / new R cp  Chief Complaint  Patient presents with  . Follow-up    productive cough with green-brownish-red phlegm since saturday, shortness of breath with activity  Dyspnea: at rest  Cough: rattling/ honking cough  Sleeping: on side 2-3 pillows  SABA use: has not tried saba  02: 3lpm Pulsed  Does not have neb equipment, not sure he can get pantoprazole or symbicort  Thru his insurance company rec To ER    [name] eval 08/01/20 rec ST eval done 08/05/20: low risk asp SLP Diet Recommendations Regular solids;Thin liquid  Liquid Administration via Straw;Cup  Medication Administration Whole meds with liquid  Compensations Slow rate;Small sips/bites  Postural Changes Seated upright at 90 degrees;Remain semi-upright after after feeds/meals (Comment)       09/15/2020  f/u ov/[place] office/[name] re:  GOLD III Chief Complaint  Patient presents with  . Followup     Breathing has improved since the last visit here. He states he was hospitalized recently for COPD flare. He is using his albuterol inhaler 5 x per wk on average and neb 1-2 x per wk.  Dyspnea:  150 ft and flat / can do s stopping / s 02  Cough: minimal/  Sleeping: on 3 pillows  SABA AVW:PVXY using much less  02: 3lpm hs and at rest but not with activity  Covid status: x 2 last never got 3 booster Lung cancer screening: n/a rec Plan A = Automatic = Always=    breztri Take 2 puffs first thing in am and then another 2 puffs about 12 hours later.  Work on inhaler technique: Plan B = Backup (to supplement plan A Plan C = Crisis (instead of Plan B but only if Plan B stops working) - only use your albuterol nebulizer if you first try Plan B Make sure you check your oxygen saturation  at your highest level of activity  to be sure it stays over 90% and adjust  02 flow upward to maintain this level if needed but remember to turn it back to previous settings when you stop (to conserve your supply).  6-8 in bed  blocks under the head of the bed Late add : ok to reduce bisoprolol to 5 mg one half daily if bp staying under 801 systolic    12/11/5372  f/u ov/[place] office/Christopher Pelfrey re: GOLD III/ 02  dep maint rx spiriva/symbicort 160 Chief Complaint  Patient presents with  . Follow-up    Intermittent productive cough white cloudy phlegm  Dyspnea:  Indoor shopping on 3lpm does not check sats  Cough: better  Sleeping: bed  Blocks helping  SABA use: rarely albuterol mostly p ex  02: 3lpm hs  Covid status: vax x 3      No obvious day to day or daytime variability or assoc  mucus plugs or hemoptysis or cp or chest tightness, subjective wheeze or overt sinus or hb symptoms.   Sleeping  without nocturnal  or early am exacerbation  of respiratory  c/o's or need for noct saba. Also denies any obvious fluctuation of symptoms with weather or environmental changes or other aggravating or alleviating factors except as outlined above   No unusual exposure hx or h/o childhood pna/ asthma or knowledge of premature birth.  Current Allergies, Complete Past Medical History, Past Surgical History, Family History, and Social History were reviewed in [employer] record.  ROS  The following are not active complaints unless bolded Hoarseness, sore throat, dysphagia, dental problems, itching, sneezing,  nasal congestion or discharge of excess mucus or purulent secretions, ear ache,   fever, chills, sweats, unintended wt loss or wt gain, classically pleuritic or exertional cp,  orthopnea pnd or arm/hand swelling  or leg swelling, presyncope, palpitations, abdominal pain, anorexia, nausea, vomiting, diarrhea  or change in bowel habits or change in bladder habits, change in stools or change in urine, dysuria, hematuria,  rash, arthralgias, visual complaints, headache, numbness, weakness or ataxia or problems with walking or coordination,  change in mood or  memory.        Current Meds  Medication Sig  .  acetaminophen (TYLENOL) 500 MG tablet Take 1,000 mg by mouth daily as needed for headache.   . albuterol (PROVENTIL) (2.5 MG/3ML) 0.083% nebulizer solution Take 3 mLs (2.5 mg total) by nebulization every 2 (two) hours as needed for wheezing or shortness of breath (cough).  . ALPRAZolam (XANAX) 0.25 MG tablet Take 0.25 mg by mouth 2 (two) times daily as needed for sleep.   [name] aspirin EC 81 MG tablet Take 1 tablet (81 mg total) by mouth daily with breakfast.  . atorvastatin (LIPITOR) 40 MG tablet Take 40 mg by mouth daily.  . budesonide-formoterol (SYMBICORT) 160-4.5 MCG/ACT inhaler Inhale 2 puffs into the lungs 2 (two) times daily.  . Cholecalciferol (VITAMIN D3) 25 MCG (1000 UT) CAPS Take 1 capsule by mouth daily.  . [employer] VITAMIN C 250 MG tablet Take 250 mg by mouth daily.  [name] diltiazem (DILACOR XR) 240 MG 24 hr capsule Take 240 mg by mouth daily.  [name] gabapentin (NEURONTIN) 300 MG capsule Take 1 capsule (300 mg total) by mouth at bedtime.  [name] glimepiride (AMARYL) 1 MG tablet Take 1 mg by mouth daily with breakfast.  . guaiFENesin (MUCINEX) 600 MG 12 hr tablet Take 1 tablet (600 mg total) by mouth 2 (two) times daily. (Patient taking differently: Take 600 mg by mouth 2 (two) times daily as needed.)  . guaiFENesin-dextromethorphan (ROBITUSSIN DM) 100-10 MG/5ML syrup Take 5 mLs by mouth every 4 (four) hours as needed for cough (chest congestion).  [name] JARDIANCE 25 MG TABS tablet Take 25 mg by mouth every evening.   . Multiple Vitamin (MULTIVITAMIN WITH MINERALS) TABS tablet Take 1 tablet by mouth at bedtime.  Glory Rosebush DELICA LANCETS 62G MISC   . ONETOUCH VERIO test strip   . OXYGEN  Inhale 4 L into the lungs continuous. 3-4 L  . pantoprazole (PROTONIX) 40 MG tablet Take 1 tablet (40 mg total) by mouth daily.  [name] PROAIR HFA 108 (90 Base) MCG/ACT inhaler Inhale 2 puffs into the lungs every 4 (four) hours as needed for wheezing or shortness of breath.  . sertraline (ZOLOFT) 50 MG tablet Take 1 tablet (50 mg  total) by mouth daily.  . SitaGLIPtin-MetFORMIN HCl [phone] MG TB24 Take 1 tablet by mouth daily.  . sodium chloride (OCEAN) 0.65 % SOLN nasal spray Place 1 spray into both nostrils as needed for congestion.  . tamsulosin (FLOMAX) 0.4 MG CAPS capsule Take 0.4 mg by mouth daily.  [name] thiamine 100 MG tablet Take 1 tablet (100 mg total) by mouth daily.  . Tiotropium Bromide Monohydrate (SPIRIVA RESPIMAT) 2.5 MCG/ACT AERS Inhale 2 puffs into the lungs daily.             Past Medical History:  Diagnosis Date  . Anxiety   . Asthma   . Chest pain    a. 2003 Cath: nl cors.  [name] COPD (chronic obstructive pulmonary disease) ([place])   . Depression   . Diabetes mellitus   . Dyslipidemia   . Dysrhythmia   . Fracture 12/15   lower back "L-1"  . GERD (gastroesophageal reflux disease)   . Hypertension   . PONV (postoperative nausea and vomiting)   . Splenomegaly        Objective:     12/08/2020          163 09/15/2020        154  05/23/2020      166 04/28/2020      166   12/30/19 162 lb (73.5 kg)  12/21/19 162 lb (73.5 kg)  12/09/19 162 lb (73.5 kg)   Vital signs reviewed  12/08/2020  - Note at rest 02 sats  99% on 3lpm    General appearance:    slt hoarse amb wm nad / easily confused with details of care    HEENT : pt wearing mask not removed for exam due to covid -19 concerns.    NECK :  without JVD/Nodes/TM/ nl carotid upstrokes bilaterally   LUNGS: no acc muscle use,  Mod barrel  contour chest wall with bilateral  Distant bs s audible wheeze and  without cough on insp or exp maneuvers and mod  Hyperresonant  to  percussion bilaterally     CV:  RRR  no s3 or murmur or increase in P2, and no edema   ABD:  soft and nontender with pos mid insp Hoover's  in the supine position. No bruits or organomegaly appreciated, bowel sounds nl  MS:     ext warm without deformities, calf tenderness, cyanosis or clubbing No obvious joint restrictions   SKIN: warm and dry without lesions     NEURO:  alert, approp, nl sensorium with  no motor or cerebellar deficits apparent.                 I personally reviewed images and agree with radiology impression as follows:  CXR:   09/03/20 1. Improved aeration of the left lung with residual less prominent patchy airspace consolidation in the mid lung field. 2. Milder peribronchial airspace consolidation throughout the right lung.     Assessment

## 2020-12-12 ENCOUNTER — Ambulatory Visit (HOSPITAL_COMMUNITY): Payer: HMO | Admitting: Anesthesiology

## 2020-12-12 ENCOUNTER — Encounter (HOSPITAL_COMMUNITY)
Admission: RE | Disposition: A | Discharge status: Home / Self Care | Payer: Self-pay | Source: Home / Self Care | Attending: Internal Medicine

## 2020-12-12 ENCOUNTER — Encounter (HOSPITAL_COMMUNITY): Payer: Self-pay | Admitting: Internal Medicine

## 2020-12-12 ENCOUNTER — Ambulatory Visit (HOSPITAL_COMMUNITY)
Admission: RE | Admit: 2020-12-12 | Discharge: 2020-12-12 | Disposition: A | Discharge status: Home / Self Care | Payer: HMO | Attending: Internal Medicine | Admitting: Internal Medicine

## 2020-12-12 ENCOUNTER — Other Ambulatory Visit: Payer: Self-pay

## 2020-12-12 DIAGNOSIS — J441 Chronic obstructive pulmonary disease with (acute) exacerbation: Secondary | ICD-10-CM | POA: Diagnosis not present

## 2020-12-12 DIAGNOSIS — R1314 Dysphagia, pharyngoesophageal phase: Secondary | ICD-10-CM | POA: Diagnosis not present

## 2020-12-12 DIAGNOSIS — Z9981 Dependence on supplemental oxygen: Secondary | ICD-10-CM | POA: Insufficient documentation

## 2020-12-12 DIAGNOSIS — R1310 Dysphagia, unspecified: Secondary | ICD-10-CM

## 2020-12-12 DIAGNOSIS — K766 Portal hypertension: Secondary | ICD-10-CM | POA: Diagnosis not present

## 2020-12-12 DIAGNOSIS — K219 Gastro-esophageal reflux disease without esophagitis: Secondary | ICD-10-CM | POA: Diagnosis not present

## 2020-12-12 DIAGNOSIS — Z7984 Long term (current) use of oral hypoglycemic drugs: Secondary | ICD-10-CM | POA: Diagnosis not present

## 2020-12-12 DIAGNOSIS — Z7982 Long term (current) use of aspirin: Secondary | ICD-10-CM | POA: Insufficient documentation

## 2020-12-12 DIAGNOSIS — K7460 Unspecified cirrhosis of liver: Secondary | ICD-10-CM | POA: Diagnosis not present

## 2020-12-12 DIAGNOSIS — Z87891 Personal history of nicotine dependence: Secondary | ICD-10-CM | POA: Diagnosis not present

## 2020-12-12 DIAGNOSIS — K3189 Other diseases of stomach and duodenum: Secondary | ICD-10-CM | POA: Insufficient documentation

## 2020-12-12 DIAGNOSIS — Z79899 Other long term (current) drug therapy: Secondary | ICD-10-CM | POA: Diagnosis not present

## 2020-12-12 HISTORY — PX: MALONEY DILATION: SHX5535

## 2020-12-12 HISTORY — PX: ESOPHAGOGASTRODUODENOSCOPY (EGD) WITH PROPOFOL: SHX5813

## 2020-12-12 LAB — GLUCOSE, CAPILLARY
Glucose-Capillary: 130 mg/dL — ABNORMAL HIGH (ref 70–99)
Glucose-Capillary: 132 mg/dL — ABNORMAL HIGH (ref 70–99)

## 2020-12-12 SURGERY — ESOPHAGOGASTRODUODENOSCOPY (EGD) WITH PROPOFOL
Anesthesia: General

## 2020-12-12 MED ORDER — STERILE WATER FOR IRRIGATION IR SOLN
Status: DC | PRN
  Administered 2020-12-12: 100 mL

## 2020-12-12 MED ORDER — LACTATED RINGERS IV SOLN: INTRAVENOUS | Status: DC

## 2020-12-12 MED ORDER — LIDOCAINE VISCOUS HCL 2 % MT SOLN
OROMUCOSAL | Status: AC
  Filled 2020-12-12: qty 15

## 2020-12-12 MED ORDER — PROPOFOL 1000 MG/100ML IV EMUL
INTRAVENOUS | Status: AC
  Filled 2020-12-12: qty 100

## 2020-12-12 MED ORDER — LIDOCAINE VISCOUS HCL 2 % MT SOLN
15.0000 mL | Freq: Once | OROMUCOSAL | Status: AC
  Administered 2020-12-12: 3 mL via OROMUCOSAL

## 2020-12-12 MED ORDER — PROPOFOL 10 MG/ML IV BOLUS
INTRAVENOUS | Status: DC | PRN
  Administered 2020-12-12 (×4): 50 mg via INTRAVENOUS

## 2020-12-12 MED ORDER — LIDOCAINE HCL (PF) 2 % IJ SOLN
INTRAMUSCULAR | Status: AC
  Filled 2020-12-12: qty 5

## 2020-12-12 MED ORDER — LIDOCAINE HCL (CARDIAC) PF 100 MG/5ML IV SOSY
PREFILLED_SYRINGE | INTRAVENOUS | Status: DC | PRN
  Administered 2020-12-12: 50 mg via INTRAVENOUS

## 2020-12-12 NOTE — Anesthesia Preprocedure Evaluation (Signed)
Anesthesia Evaluation  Patient identified by MRN, date of birth, ID band Patient awake    Reviewed: Allergy & Precautions, NPO status , Patient's Chart, lab work & pertinent test results  History of Anesthesia Complications (+) PONV and history of anesthetic complications  Airway Mallampati: II  TM Distance: >3 FB Neck ROM: Full    Dental  (+) Edentulous Upper, Edentulous Lower   Pulmonary shortness of breath, with exertion and Long-Term Oxygen Therapy, asthma , pneumonia, resolved, COPD,  COPD inhaler and oxygen dependent, former smoker,    Pulmonary exam normal breath sounds clear to auscultation       Cardiovascular Exercise Tolerance: Poor hypertension, Pt. on medications Normal cardiovascular exam+ dysrhythmias  Rhythm:Regular Rate:Normal     Neuro/Psych PSYCHIATRIC DISORDERS Anxiety Depression    GI/Hepatic GERD  Medicated and Controlled,(+) Cirrhosis       , Hepatitis -  Endo/Other  diabetes, Well Controlled, Type 2, Oral Hypoglycemic Agents  Renal/GU      Musculoskeletal  (+) Arthritis  (back pain),   Abdominal   Peds  Hematology   Anesthesia Other Findings   Reproductive/Obstetrics                             Anesthesia Physical Anesthesia Plan  ASA: IV  Anesthesia Plan: General   Post-op Pain Management:    Induction:   PONV Risk Score and Plan: Propofol infusion  Airway Management Planned: Natural Airway and Nasal Cannula  Additional Equipment:   Intra-op Plan:   Post-operative Plan:   Informed Consent: I have reviewed the patients History and Physical, chart, labs and discussed the procedure including the risks, benefits and alternatives for the proposed anesthesia with the patient or authorized representative who has indicated his/her understanding and acceptance.     Dental advisory given  Plan Discussed with: CRNA and Surgeon  Anesthesia Plan  Comments:         Anesthesia Quick Evaluation

## 2020-12-12 NOTE — H&P (Signed)
@LOGO @   Primary Care Physician:  Asencion Noble, MD Primary Gastroenterologist:  Dr. [name]  Pre-Procedure History & Physical: HPI:  [name] is a 65 y.o. male here for your further evaluation of dysphagia and esophageal varices screening.  GERD well controlled.  Past Medical History:  Diagnosis Date  . Anxiety   . Asthma   . Chest pain    a. 2003 Cath: nl cors.  [name] COPD (chronic obstructive pulmonary disease) ([place])    chronically on 3-4L [place]  . Depression   . Diabetes mellitus   . Dyslipidemia   . Dysrhythmia   . Fracture 12/15   lower back "L-1"  . GERD (gastroesophageal reflux disease)   . Hypertension   . NASH (nonalcoholic steatohepatitis) 2018   [name], undergoing routine cirrhosis care  . PONV (postoperative nausea and vomiting)   . Splenomegaly     Past Surgical History:  Procedure Laterality Date  . BIOPSY  07/26/2016   Procedure: BIOPSY;  Surgeon: [name], MD;  Location: [facility];  Service: Endoscopy;;  gastric  . CARDIAC CATHETERIZATION  10/22/2001   normal coronary arteriea (Dr. Domenic Moras)  . COLONOSCOPY  08/31/2011   TML:YYTKPTWS rectal and colon polyps-treated. Single tubular adenoma. Next TCS 08/2016  . COLONOSCOPY N/A 07/26/2016   Dr. [name]: diverticulosis in sigmoid and descending colon. 6 mm benign splenic flexure. surveillance 5 yeras  . ESOPHAGOGASTRODUODENOSCOPY N/A 07/26/2016   Dr. [name]: normal esophagus, portal gastropathy, chronic gastritis, normal duodenum, screening EGD 2 years   . ESOPHAGOGASTRODUODENOSCOPY (EGD) WITH ESOPHAGEAL DILATION N/A 05/15/2013   FKC:LEXNTZ esophagus-status post Venia Minks dilation/Portal gastropathy. Antral erosions-status post biopsy. extrinsic compression along lesser curvature likely secondary to splenomegaly. gastric bx benign  . HERNIA REPAIR  03/2009  . POLYPECTOMY  07/26/2016   Procedure: POLYPECTOMY;  Surgeon: [name], MD;  Location: [facility];  Service: Endoscopy;;  colon  .  TRANSTHORACIC ECHOCARDIOGRAM  2011   EF 00-17%, stage 1 diastolic dysfunction, trace TR & pulm valve regurg   . VASECTOMY  1989    Prior to Admission medications   Medication Sig Start Date End Date Taking? Authorizing Provider  acetaminophen (TYLENOL) 500 MG tablet Take 1,000 mg by mouth daily as needed for headache.    Yes [provider]  albuterol (PROVENTIL) (2.5 MG/3ML) 0.083% nebulizer solution Take 3 mLs (2.5 mg total) by nebulization every 2 (two) hours as needed for wheezing or shortness of breath (cough). 06/13/20  Yes Emokpae, Courage, MD  ALPRAZolam [name]) 0.25 MG tablet Take 0.25 mg by mouth 2 (two) times daily as needed for sleep.    Yes [provider]  aspirin EC 81 MG tablet Take 1 tablet (81 mg total) by mouth daily with breakfast. 06/13/20  Yes Emokpae, Courage, MD  atorvastatin (LIPITOR) 40 MG tablet Take 40 mg by mouth daily.   Yes [provider]  budesonide-formoterol (SYMBICORT) 160-4.5 MCG/ACT inhaler Inhale 2 puffs into the lungs 2 (two) times daily.   Yes [provider]  Cholecalciferol (VITAMIN D3) 25 MCG (1000 UT) CAPS Take 1 capsule by mouth daily.   Yes [provider]  [employer]S VITAMIN C 250 MG tablet Take 250 mg by mouth daily. 06/13/20  Yes [provider]  diltiazem (DILACOR XR) 240 MG 24 hr capsule Take 240 mg by mouth daily.   Yes [provider]  gabapentin (NEURONTIN) 300 MG capsule Take 1 capsule (300 mg total) by mouth at bedtime. 06/13/20  Yes Emokpae,  Courage, MD  glimepiride (AMARYL) 1 MG tablet Take 1 mg by mouth daily with breakfast.   Yes [provider]  guaiFENesin (MUCINEX) 600 MG 12 hr tablet Take 1 tablet (600 mg total) by mouth 2 (two) times daily. Patient taking differently: Take 600 mg by mouth 2 (two) times daily as needed. 06/13/20  Yes Emokpae, Courage, MD  guaiFENesin-dextromethorphan (ROBITUSSIN DM) 100-10 MG/5ML syrup Take 5 mLs by mouth every 4 (four) hours as needed for  cough (chest congestion). 08/23/20  Yes Manuella Ghazi, Pratik D, DO  JARDIANCE 25 MG TABS tablet Take 25 mg by mouth every evening.  01/01/17  Yes [provider]  Multiple Vitamin (MULTIVITAMIN WITH MINERALS) TABS tablet Take 1 tablet by mouth at bedtime. 06/13/20  Yes Emokpae, Courage, MD  OXYGEN Inhale 4 L into the lungs continuous. 3-4 L   Yes [provider]  pantoprazole (PROTONIX) 40 MG tablet Take 1 tablet (40 mg total) by mouth daily. 07/14/20  Yes Erenest Rasher, PA-C  PROAIR HFA 108 [phone] Base) MCG/ACT inhaler Inhale 2 puffs into the lungs every 4 (four) hours as needed for wheezing or shortness of breath. 06/13/20  Yes Emokpae, Courage, MD  sertraline (ZOLOFT) 50 MG tablet Take 1 tablet (50 mg total) by mouth daily. 06/13/20  Yes Emokpae, Courage, MD  SitaGLIPtin-MetFORMIN HCl [phone] MG TB24 Take 1 tablet by mouth daily.   Yes [provider]  tamsulosin (FLOMAX) 0.4 MG CAPS capsule Take 0.4 mg by mouth daily.   Yes [provider]  thiamine 100 MG tablet Take 1 tablet (100 mg total) by mouth daily. 06/14/20  Yes Emokpae, Courage, MD  Tiotropium Bromide Monohydrate (SPIRIVA RESPIMAT) 2.5 MCG/ACT AERS Inhale 2 puffs into the lungs daily.   Yes [provider]  Jonetta Speak LANCETS 97Q MISC  08/26/18   [provider]  [facility] VERIO test strip  08/26/18   [provider]  sodium chloride (OCEAN) 0.65 % SOLN nasal spray Place 1 spray into both nostrils as needed for congestion. 08/23/20   Heath Lark D, DO    Allergies as of 10/24/2020 - Review Complete 10/12/2020  Allergen Reaction Noted  . Baclofen Other (See Comments) 07/24/2016  . Simvastatin Nausea Only 10/05/2013    Family History  Problem Relation Age of Onset  . Heart disease Mother   . Cancer Mother        lymph nodes  . Diabetes Mother   . Arthritis Other   . Lung disease Other   . Asthma Other   . Kidney disease Other   . Ovarian cancer Sister   . Diabetes Brother    . Heart disease Brother   . Hypertension Brother   . Diabetes Sister   . Diabetes Sister   . Cirrhosis Sister 51       no etoh  . Colon cancer Neg Hx     Social History   Socioeconomic History  . Marital status: Married    Spouse name: Not on file  . Number of children: Not on file  . Years of education: Not on file  . Highest education level: Not on file  Occupational History  . Not on file  Tobacco Use  . Smoking status: Former Smoker    Packs/day: 1.25    Years: 36.00    Pack years: 45.00    Types: Cigarettes    Quit date: 04/2011    Years since quitting: 9.6  . Smokeless tobacco: Former Network engineer  .  Vaping Use: Never used  Substance and Sexual Activity  . Alcohol use: No  . Drug use: No  . Sexual activity: Not on file  Other Topics Concern  . Not on file  Social History Narrative  . Not on file   Social Determinants of Health   Financial Resource Strain: Low Risk   . Difficulty of Paying Living Expenses: Not hard at all  Food Insecurity: No Food Insecurity  . Worried About Charity fundraiser in the Last Year: Never true  . Ran Out of Food in the Last Year: Never true  Transportation Needs: No Transportation Needs  . Lack of Transportation (Medical): No  . Lack of Transportation (Non-Medical): No  Physical Activity: Sufficiently Active  . Days of Exercise per Week: 7 days  . Minutes of Exercise per Session: 30 min  Stress: No Stress Concern Present  . Feeling of Stress : Not at all  Social Connections: Moderately Integrated  . Frequency of Communication with Friends and Family: Three times a week  . Frequency of Social Gatherings with Friends and Family: Three times a week  . Attends Religious Services: More than 4 times per year  . Active Member of Clubs or Organizations: No  . Attends Archivist Meetings: Never  . Marital Status: Married  Human resources officer Violence: Not At Risk  . Fear of Current or Ex-Partner: No  . Emotionally  Abused: No  . Physically Abused: No  . Sexually Abused: No    Review of Systems: See HPI, otherwise negative ROS  Physical Exam: BP 113/66   Pulse 76   Temp 97.6 F (36.4 C) (Oral)   Resp 18   SpO2 97%  General:   Alert,  Well-developed, well-nourished, pleasant and cooperative in NAD Neck:  Supple; no masses or thyromegaly. No significant cervical adenopathy. Lungs:  Clear throughout to auscultation.   No wheezes, crackles, or rhonchi. No acute distress. Heart:  Regular rate and rhythm; no murmurs, clicks, rubs,  or gallops. Abdomen: Non-distended, normal bowel sounds.  Soft and nontender without appreciable mass or hepatosplenomegaly.  Pulses:  Normal pulses noted. Extremities:  Without clubbing or edema.  Impression/Plan: 65 year old with well compensated cirrhosis now with esophageal dysphagia.  Need screening EGD and intervention for dysphagia as feasible/appropriate per plan. The risks, benefits, limitations, alternatives and imponderables have been reviewed with the patient. Potential for esophageal dilation, biopsy, etc. have also been reviewed.  Questions have been answered. All parties agreeable.     Notice: This dictation was prepared with Dragon dictation along with smaller phrase technology. Any transcriptional errors that result from this process are unintentional and may not be corrected upon review.

## 2020-12-12 NOTE — Discharge Instructions (Signed)
Monitored Anesthesia Care, Care After This sheet gives you information about how to care for yourself after your procedure. Your health care provider may also give you more specific instructions. If you have problems or questions, contact your health care provider. What can I expect after the procedure? After the procedure, it is common to have:  Tiredness.  Forgetfulness about what happened after the procedure.  Impaired judgment for important decisions.  Nausea or vomiting.  Some difficulty with balance. Follow these instructions at home: For the time period you were told by your health care provider:  Rest as needed.  Do not participate in activities where you could fall or become injured.  Do not drive or use machinery.  Do not drink alcohol.  Do not take sleeping pills or medicines that cause drowsiness.  Do not make important decisions or sign legal documents.  Do not take care of children on your own.      Eating and drinking  Follow the diet that is recommended by your health care provider.  Drink enough fluid to keep your urine pale yellow.  If you vomit: ? Drink water, juice, or soup when you can drink without vomiting. ? Make sure you have little or no nausea before eating solid foods. General instructions  Have a responsible adult stay with you for the time you are told. It is important to have someone help care for you until you are awake and alert.  Take over-the-counter and prescription medicines only as told by your health care provider.  If you have sleep apnea, surgery and certain medicines can increase your risk for breathing problems. Follow instructions from your health care provider about wearing your sleep device: ? Anytime you are sleeping, including during daytime naps. ? While taking prescription pain medicines, sleeping medicines, or medicines that make you drowsy.  Avoid smoking.  Keep all follow-up visits as told by your health care  provider. This is important. Contact a health care provider if:  You keep feeling nauseous or you keep vomiting.  You feel light-headed.  You are still sleepy or having trouble with balance after 24 hours.  You develop a rash.  You have a fever.  You have redness or swelling around the IV site. Get help right away if:  You have trouble breathing.  You have new-onset confusion at home. Summary  For several hours after your procedure, you may feel tired. You may also be forgetful and have poor judgment.  Have a responsible adult stay with you for the time you are told. It is important to have someone help care for you until you are awake and alert.  Rest as told. Do not drive or operate machinery. Do not drink alcohol or take sleeping pills.  Get help right away if you have trouble breathing, or if you suddenly become confused. This information is not intended to replace advice given to you by your health care provider. Make sure you discuss any questions you have with your health care provider. Document Revised: 03/10/2020 Document Reviewed: 05/28/2019 Elsevier Patient Education  2021 [place].   EGD Discharge instructions Please read the instructions outlined below and refer to this sheet in the next few weeks. These discharge instructions provide you with general information on caring for yourself after you leave the hospital. Your doctor may also give you specific instructions. While your treatment has been planned according to the most current medical practices available, unavoidable complications occasionally occur. If you have any problems or  questions after discharge, please call your doctor. ACTIVITY  You may resume your regular activity but move at a slower pace for the next 24 hours.   Take frequent rest periods for the next 24 hours.   Walking will help expel (get rid of) the air and reduce the bloated feeling in your abdomen.   No driving for 24 hours  (because of the anesthesia (medicine) used during the test).   You may shower.   Do not sign any important legal documents or operate any machinery for 24 hours (because of the anesthesia used during the test).  NUTRITION  Drink plenty of fluids.   You may resume your normal diet.   Begin with a light meal and progress to your normal diet.   Avoid alcoholic beverages for 24 hours or as instructed by your caregiver.  MEDICATIONS  You may resume your normal medications unless your caregiver tells you otherwise.  WHAT YOU CAN EXPECT TODAY  You may experience abdominal discomfort such as a feeling of fullness or "gas" pains.  FOLLOW-UP  Your doctor will discuss the results of your test with you.  SEEK IMMEDIATE MEDICAL ATTENTION IF ANY OF THE FOLLOWING OCCUR:  Excessive nausea (feeling sick to your stomach) and/or vomiting.   Severe abdominal pain and distention (swelling).   Trouble swallowing.   Temperature over 101 F (37.8 C).   Rectal bleeding or vomiting of blood.    Your esophagus was stretched today  Office visit with [place] in 6 months  At patient request, I called Katharine Look at [phone] -reviewed findings and recommendations

## 2020-12-12 NOTE — Op Note (Signed)
[facility] Patient Name: [name] Procedure Date: 12/12/2020 8:51 AM MRN: [id number] Date of Birth: August 03, 1955 Attending MD: Norvel Richards , MD CSN: [id number] Age: 65 Admit Type: Outpatient Procedure:                Upper GI endoscopy Indications:              Dysphagia Providers:                Norvel Richards, MD, Crystal Page, Raphael Gibney, Technician Referring MD:              Medicines:                Monitored Anesthesia Care Complications:            No immediate complications. Estimated Blood Loss:     Estimated blood loss was minimal. Estimated blood                            loss: none. Procedure:                Pre-Anesthesia Assessment:                           - Prior to the procedure, a History and Physical                            was performed, and patient medications and                            allergies were reviewed. The patient's tolerance of                            previous anesthesia was also reviewed. The risks                            and benefits of the procedure and the sedation                            options and risks were discussed with the patient.                            All questions were answered, and informed consent                            was obtained. Prior Anticoagulants: The patient has                            taken no previous anticoagulant or antiplatelet                            agents. ASA Grade Assessment: III - A patient with                            severe systemic  disease. After reviewing the risks                            and benefits, the patient was deemed in                            satisfactory condition to undergo the procedure.                           After obtaining informed consent, the endoscope was                            passed under direct vision. Throughout the                            procedure, the patient's blood pressure, pulse, and                             oxygen saturations were monitored continuously. The                            [id number]-H190 ([id number]) scope was introduced through the                            mouth, and advanced to the second part of duodenum.                            The upper GI endoscopy was accomplished without                            difficulty. The patient tolerated the procedure                            well. Scope In: 9:17:45 AM Scope Out: 9:24:43 AM Total Procedure Duration: 0 hours 6 minutes 58 seconds  Findings:      The examined esophagus was normal.      Mild portal hypertensive gastropathy was found in the gastric body.      The duodenal bulb and second portion of the duodenum were normal. The       scope was withdrawn. Dilation was performed with a Maloney dilator with       no resistance at 56 Fr. The dilation site was examined following       endoscope reinsertion and showed no change. Estimated blood loss: none. Impression:               - Normal esophagus. Dilated.                           - Portal hypertensive gastropathy.                           - Normal duodenal bulb and second portion of the                            duodenum.                           -  No specimens collected. Moderate Sedation:      Moderate (conscious) sedation was personally administered by an       anesthesia professional. The following parameters were monitored: oxygen       saturation, heart rate, blood pressure, respiratory rate, EKG, adequacy       of pulmonary ventilation, and response to care. Recommendation:           - Patient has a contact number available for                            emergencies. The signs and symptoms of potential                            delayed complications were discussed with the                            patient. Return to normal activities tomorrow.                            Written discharge instructions were provided to the                             patient.                           - Advance diet as tolerated.                           - Continue present medications.                           - Repeat upper endoscopy for screening purposes.                           - Return to GI clinic in 6 months. Procedure Code(s):        --- Professional ---                           [phone], Esophagogastroduodenoscopy, flexible,                            transoral; diagnostic, including collection of                            specimen(s) by brushing or washing, when performed                            (separate procedure)                           43450, Dilation of esophagus, by unguided sound or                            bougie, single or multiple passes Diagnosis Code(s):        --- Professional ---  K76.6, Portal hypertension                           K31.89, Other diseases of stomach and duodenum                           R13.10, Dysphagia, unspecified CPT copyright 2019 American Medical Association. All rights reserved. The codes documented in this report are preliminary and upon coder review may  be revised to meet current compliance requirements. Cristopher Estimable. Alfreda Hammad, MD Norvel Richards, MD 12/12/2020 9:36:12 AM This report has been signed electronically. Number of Addenda: 0

## 2020-12-12 NOTE — Anesthesia Postprocedure Evaluation (Signed)
Anesthesia Post Note  Patient: [name]  Procedure(s) Performed: ESOPHAGOGASTRODUODENOSCOPY (EGD) WITH PROPOFOL (N/A ) MALONEY DILATION (N/A )  Patient location during evaluation: Endoscopy Anesthesia Type: General Level of consciousness: awake and alert Pain management: pain level controlled Vital Signs Assessment: post-procedure vital signs reviewed and stable Respiratory status: spontaneous breathing, nonlabored ventilation, respiratory function stable and patient connected to nasal cannula oxygen Cardiovascular status: blood pressure returned to baseline and stable Postop Assessment: no apparent nausea or vomiting Anesthetic complications: no   No complications documented.   Last Vitals:  Vitals:   12/12/20 0744 12/12/20 0931  BP: 113/66 98/64  Pulse: 76 77  Resp: 18 16  Temp: 36.4 C (!) 36.3 C  SpO2: 97% 95%    Last Pain:  Vitals:   12/12/20 0931  TempSrc: Oral  PainSc: 0-No pain                 [name]

## 2020-12-12 NOTE — Transfer of Care (Signed)
Immediate Anesthesia Transfer of Care Note  Patient: [name]  Procedure(s) Performed: ESOPHAGOGASTRODUODENOSCOPY (EGD) WITH PROPOFOL (N/A ) MALONEY DILATION (N/A )  Patient Location: PACU  Anesthesia Type:General  Level of Consciousness: awake, alert  and oriented  Airway & Oxygen Therapy: Patient Spontanous Breathing  Post-op Assessment: Report given to RN, Post -op Vital signs reviewed and stable and Patient moving all extremities X 4  Post vital signs: Reviewed and stable  Last Vitals:  Vitals Value Taken Time  BP    Temp    Pulse    Resp    SpO2      Last Pain:  Vitals:   12/12/20 0911  TempSrc:   PainSc: 0-No pain         Complications: No complications documented.

## 2020-12-20 ENCOUNTER — Encounter (HOSPITAL_COMMUNITY): Payer: Self-pay | Admitting: Internal Medicine

## 2021-01-03 ENCOUNTER — Telehealth: Payer: Self-pay | Admitting: Internal Medicine

## 2021-01-03 NOTE — Telephone Encounter (Signed)
Letter mailed

## 2021-01-03 NOTE — Telephone Encounter (Signed)
RECALL FOR ULTRASOUND 

## 2021-01-04 DIAGNOSIS — E119 Type 2 diabetes mellitus without complications: Secondary | ICD-10-CM | POA: Diagnosis not present

## 2021-01-11 ENCOUNTER — Encounter: Payer: Self-pay | Admitting: Gastroenterology

## 2021-01-11 NOTE — Progress Notes (Signed)
Referring Provider: Asencion Noble, MD Primary Care Physician:  Asencion Noble, MD Primary GI Physician: Dr. [name]  Chief Complaint  Patient presents with   Follow-up    [facility] [name], Dysphagia    HPI:   [name] is a 65 y.o. male with history significant for GERD, biopsy-proven NASH fibrosis, elastography F2/F3, chronic thrombocytopenia following with hematology, splenomegaly, portal gastropathy, currently being followed for routine cirrhosis care.  MELD 6 in June 2022. Recently underwent EGD in June 2022 with normal esophagus s/p dilation, portal hypertensive gastropathy.  Due for repeat in 2024. Currently due for surveillance ultrasound.  Colonoscopy up-to-date in 2018, due for repeat in January 2023. He is presenting today for follow-up.   He was last seen in the office 10/12/2020.  GERD well controlled on Protonix 40 mg daily.  Reported solid food dysphagia requiring regurgitation at times.  Regarding cirrhosis, he noted intermittent swelling in his legs and occasionally feeling his abdomen was tight continued fatigue lingers and looks hotdogs.  No other signs or symptoms of decompensated liver disease.  On exam, he had trace bilateral LE edema, abdomen was soft.  Plan to update EGD, reevaluate for hepatitis A/B immunity as he was not sure if he completed vaccination.  Discussed low-dose diuretics, but patient preferred to hold off and work on dietary adjustments.  Otherwise, continue current medications and follow-up in 3 months.  Labs completed 10/14/2020 revealed no immunity to hepatitis A or B.  Recommended vaccination and prescription was mailed to patient. EGD completed 12/12/2020 as described above.  Today:  GERD: Well controlled on Protonix 40 gm daily.   Dysphagia: Resolved.   Nash fibrosis: No significant swelling in LE or abdomen. Feels this has improved. Starting cooking more home meals rather than eating pre-made foods and hot dogs. Admits to easy bruising. No bleeding. No change  in mental status or yellowing of eyes/skin.   No alcohol.  Occasional tylenol.  Received 2 Hep A/B vaccinations. Last dose will be in September.   Denies nausea, vomiting, abdominal pain, brbpr, or melena.   Past Medical History:  Diagnosis Date   Anxiety    Asthma    Chest pain    a. 2003 Cath: nl cors.   COPD (chronic obstructive pulmonary disease) ([name])    chronically on 3-4L [place]   Depression    Diabetes mellitus    Dyslipidemia    Dysrhythmia    Fracture 12/15   lower back "L-1"   GERD (gastroesophageal reflux disease)    Hypertension    NASH (nonalcoholic steatohepatitis) 2018   [name], undergoing routine cirrhosis care   PONV (postoperative nausea and vomiting)    Splenomegaly     Past Surgical History:  Procedure Laterality Date   BIOPSY  07/26/2016   Procedure: BIOPSY;  Surgeon: [name], MD;  Location: [facility];  Service: Endoscopy;;  gastric   CARDIAC CATHETERIZATION  10/22/2001   normal coronary arteriea (Dr. Domenic Moras)   COLONOSCOPY  08/31/2011   YIF:OYDXAJOI rectal and colon polyps-treated. Single tubular adenoma. Next TCS 08/2016   COLONOSCOPY N/A 07/26/2016   Dr. [name]: diverticulosis in sigmoid and descending colon. 6 mm benign splenic flexure. surveillance 5 yeras   ESOPHAGOGASTRODUODENOSCOPY N/A 07/26/2016   Dr. [name]: normal esophagus, portal gastropathy, chronic gastritis, normal duodenum, screening EGD 2 years    ESOPHAGOGASTRODUODENOSCOPY (EGD) WITH ESOPHAGEAL DILATION N/A 05/15/2013   NOM:VEHMCN esophagus-status post Venia Minks dilation/Portal gastropathy. Antral erosions-status post biopsy. extrinsic compression along lesser curvature likely  secondary to splenomegaly. gastric bx benign   ESOPHAGOGASTRODUODENOSCOPY (EGD) WITH PROPOFOL N/A 12/12/2020   Surgeon: [name], MD; ormal esophagus s/p dilation, portal hypertensive gastropathy.  Due for repeat in 2024.   HERNIA REPAIR  03/2009   MALONEY DILATION N/A 12/12/2020    Procedure: Venia Minks DILATION;  Surgeon: [name], MD;  Location: [facility];  Service: Endoscopy;  Laterality: N/A;   POLYPECTOMY  07/26/2016   Procedure: POLYPECTOMY;  Surgeon: [name], MD;  Location: [facility];  Service: Endoscopy;;  colon   TRANSTHORACIC ECHOCARDIOGRAM  2011   EF 00-93%, stage 1 diastolic dysfunction, trace TR & pulm valve regurg    VASECTOMY  1989    Current Outpatient Medications  Medication Sig Dispense Refill   acetaminophen (TYLENOL) 500 MG tablet Take 1,000 mg by mouth as needed for headache.     albuterol (PROVENTIL) (2.5 MG/3ML) 0.083% nebulizer solution Take 3 mLs (2.5 mg total) by nebulization every 2 (two) hours as needed for wheezing or shortness of breath (cough). (Patient taking differently: Take 2.5 mg by nebulization as needed for wheezing or shortness of breath (cough).) 75 mL 1   ALPRAZolam (XANAX) 0.25 MG tablet Take 0.25 mg by mouth 2 (two) times daily as needed for sleep.      aspirin EC 81 MG tablet Take 1 tablet (81 mg total) by mouth daily with breakfast. 30 tablet 5   atorvastatin (LIPITOR) 40 MG tablet Take 40 mg by mouth daily.     budesonide-formoterol (SYMBICORT) 160-4.5 MCG/ACT inhaler Inhale 2 puffs into the lungs 2 (two) times daily.     Cholecalciferol (VITAMIN D3) 25 MCG (1000 UT) CAPS Take 1 capsule by mouth daily.     [employer] VITAMIN C 250 MG tablet Take 250 mg by mouth daily.     diltiazem (DILACOR XR) 240 MG 24 hr capsule Take 240 mg by mouth daily.     gabapentin (NEURONTIN) 300 MG capsule Take 1 capsule (300 mg total) by mouth at bedtime. 30 capsule 5   glimepiride (AMARYL) 1 MG tablet Take 1 mg by mouth daily with breakfast.     guaiFENesin (MUCINEX) 600 MG 12 hr tablet Take 1 tablet (600 mg total) by mouth 2 (two) times daily. (Patient taking differently: Take 600 mg by mouth as needed.) 20 tablet 0   guaiFENesin-dextromethorphan (ROBITUSSIN DM) 100-10 MG/5ML syrup Take 5 mLs by mouth every 4 (four) hours as needed  for cough (chest congestion). (Patient taking differently: Take 5 mLs by mouth as needed for cough (chest congestion).) 118 mL 0   JARDIANCE 25 MG TABS tablet Take 25 mg by mouth every evening.   3   Multiple Vitamin (MULTIVITAMIN WITH MINERALS) TABS tablet Take 1 tablet by mouth at bedtime. 30 tablet 2   ONETOUCH DELICA LANCETS 81W MISC      ONETOUCH VERIO test strip      OXYGEN Inhale 2 L into the lungs continuous.     pantoprazole (PROTONIX) 40 MG tablet Take 1 tablet (40 mg total) by mouth daily. 90 tablet 3   PROAIR HFA 108 (90 Base) MCG/ACT inhaler Inhale 2 puffs into the lungs every 4 (four) hours as needed for wheezing or shortness of breath. 18 g 12   sertraline (ZOLOFT) 50 MG tablet Take 1 tablet (50 mg total) by mouth daily. 30 tablet 3   SitaGLIPtin-MetFORMIN HCl [phone] MG TB24 Take 1 tablet by mouth daily.     sodium chloride (OCEAN) 0.65 % SOLN nasal  spray Place 1 spray into both nostrils as needed for congestion. 60 mL 0   tamsulosin (FLOMAX) 0.4 MG CAPS capsule Take 0.4 mg by mouth daily.     thiamine 100 MG tablet Take 1 tablet (100 mg total) by mouth daily. 30 tablet 2   Tiotropium Bromide Monohydrate (SPIRIVA RESPIMAT) 2.5 MCG/ACT AERS Inhale 2 puffs into the lungs daily.     No current facility-administered medications for this visit.    Allergies as of 01/12/2021 - Review Complete 01/12/2021  Allergen Reaction Noted   Baclofen Other (See Comments) 07/24/2016   Simvastatin Nausea Only 10/05/2013    Family History  Problem Relation Age of Onset   Heart disease Mother    Cancer Mother        lymph nodes   Diabetes Mother    Arthritis Other    Lung disease Other    Asthma Other    Kidney disease Other    Ovarian cancer Sister    Diabetes Brother    Heart disease Brother    Hypertension Brother    Diabetes Sister    Diabetes Sister    Cirrhosis Sister 75       no etoh   Colon cancer Neg Hx     Social History   Socioeconomic History   Marital status:  Married    Spouse name: Not on file   Number of children: Not on file   Years of education: Not on file   Highest education level: Not on file  Occupational History   Not on file  Tobacco Use   Smoking status: Former    Packs/day: 1.25    Years: 36.00    Pack years: 45.00    Types: Cigarettes    Quit date: 04/2011    Years since quitting: 9.7   Smokeless tobacco: Former  Scientific laboratory technician Use: Never used  Substance and Sexual Activity   Alcohol use: No   Drug use: No   Sexual activity: Not on file  Other Topics Concern   Not on file  Social History Narrative   Not on file   Social Determinants of Health   Financial Resource Strain: Low Risk    Difficulty of Paying Living Expenses: Not hard at all  Food Insecurity: No Food Insecurity   Worried About Charity fundraiser in the Last Year: Never true   Arboriculturist in the Last Year: Never true  Transportation Needs: No Transportation Needs   Lack of Transportation (Medical): No   Lack of Transportation (Non-Medical): No  Physical Activity: Sufficiently Active   Days of Exercise per Week: 7 days   Minutes of Exercise per Session: 30 min  Stress: No Stress Concern Present   Feeling of Stress : Not at all  Social Connections: Moderately Integrated   Frequency of Communication with Friends and Family: Three times a week   Frequency of Social Gatherings with Friends and Family: Three times a week   Attends Religious Services: More than 4 times per year   Active Member of Clubs or Organizations: No   Attends Archivist Meetings: Never   Marital Status: Married    Review of Systems: Gen: Denies fever, chills, cold or flulike symptoms, presyncope, syncope. CV: Denies chest pain or palpitations. Resp: Chronic shortness of breath on 2 L O2. GI: See HPI Heme: See HPI  Physical Exam: BP 125/66   Pulse 97   Temp (!) 97.3 F (36.3 C) (Temporal)  Ht 5' 7"  (1.702 m)   Wt 166 lb 9.6 oz (75.6 kg)   BMI 26.09  kg/m  General:   Alert and oriented. No distress noted. Pleasant and cooperative. Nasal canula in place, on 2L O2.  Head:  Normocephalic and atraumatic. Eyes:  Conjuctiva clear without scleral icterus. Heart:  S1, S2 present without murmurs appreciated. Lungs:  Clear to auscultation bilaterally. No wheezes, rales, or rhonchi. No distress.  Abdomen:  +BS, abdomen is full/protuberant but remains soft and nontender.  No rebound or guarding. No HSM or masses noted. Msk:  Symmetrical without gross deformities. Normal posture. Extremities:  Without edema. Neurologic:  Alert and  oriented x4 Psych:  Normal mood and affect.    Assessment: 65 year old male with history of GERD, dysphagia, adenomatous colon polyps, biopsy-proven NASH fibrosis with F2/F3 on elastography, chronic thrombocytopenia, splenomegaly, portal gastropathy undergoing routine cirrhosis care who is presenting today for follow-up.  GERD: Well-controlled on Protonix 40 mg daily.  No alarm symptoms.  Dysphagia: Resolved s/p EGD with empiric dilation on 12/12/2020.  Nash fibrosis: Biopsy-proven NASH fibrosis in 2018.  Remains well compensated.  He does continue to bruise easily in the setting of chronic thrombocytopenia, but no overt GI bleeding.  No other signs or symptoms of decompensated disease.  MELD 6 on labs in June 2022.  EGD up-to-date in June 2022 with no esophageal varices but evidence of portal hypertensive gastropathy.  Currently due for routine RUQ ultrasound.  He is in the process of completing hepatitis A/B vaccinations with last dose scheduled for September 2022.  History of adenomatous colon polyps: Due for surveillance colonoscopy in January 2023.  We will see him back in January to arrange this.   Plan: 1.  RUQ ultrasound. 2.  Complete hepatitis A/B vaccine series. 3.  Strict low-sodium diet.  No more than 2000 mg daily. 4.  Advised to monitor for swelling in lower extremities/abdomen, rectal bleeding, black  stools, change in mental status, yellowing of the eyes or skin and let us know if this occurs. 5.  Due for routine cirrhosis labs in December 2022.  We will arrange for this. 6.  Due for surveillance in June 2024.  7.  Continue Protonix 40 mg daily. 8.  Colonoscopy due in January 2023.  Will discuss at follow-up. 9.  Follow-up in January 2023.    Aliene Altes, PA-C [facility] 01/12/2021

## 2021-01-12 ENCOUNTER — Other Ambulatory Visit: Payer: Self-pay

## 2021-01-12 ENCOUNTER — Ambulatory Visit: Payer: HMO | Admitting: Gastroenterology

## 2021-01-12 ENCOUNTER — Encounter: Payer: Self-pay | Admitting: Gastroenterology

## 2021-01-12 VITALS — BP 125/66 | HR 97 | Temp 97.3°F | Ht 67.0 in | Wt 166.6 lb

## 2021-01-12 DIAGNOSIS — Z86010 Personal history of colonic polyps: Secondary | ICD-10-CM

## 2021-01-12 DIAGNOSIS — K7400 Hepatic fibrosis, unspecified: Secondary | ICD-10-CM | POA: Diagnosis not present

## 2021-01-12 DIAGNOSIS — K219 Gastro-esophageal reflux disease without esophagitis: Secondary | ICD-10-CM | POA: Diagnosis not present

## 2021-01-12 DIAGNOSIS — R1310 Dysphagia, unspecified: Secondary | ICD-10-CM

## 2021-01-12 DIAGNOSIS — K7581 Nonalcoholic steatohepatitis (NASH): Secondary | ICD-10-CM | POA: Diagnosis not present

## 2021-01-12 NOTE — Patient Instructions (Signed)
We will arrange for you to have an ultrasound of your liver at [facility].  Complete your hepatitis A/B vaccination series as planned.  Follow a strict low-sodium diet.  No more than 2000 mg daily.  Monitor for swelling in her lower extremities, abdomen, rectal bleeding, black stools, change in mental status, yellowing of the eyes or skin and let us know if this occurs.  Continue Protonix 40 mg daily 30 minutes before breakfast.  We will arrange for you to have routine blood work in December 2022.  We will follow-up with you in January 2023 and will discuss scheduling her colonoscopy at that time.  It was great to see you today!  We have a great time fishing at the beach!  Aliene Altes, PA-C [facility]

## 2021-01-18 ENCOUNTER — Other Ambulatory Visit: Payer: Self-pay

## 2021-01-18 ENCOUNTER — Ambulatory Visit (HOSPITAL_COMMUNITY)
Admission: RE | Admit: 2021-01-18 | Discharge: 2021-01-18 | Disposition: A | Discharge status: Home / Self Care | Payer: HMO | Source: Ambulatory Visit | Attending: Gastroenterology | Admitting: Gastroenterology

## 2021-01-18 DIAGNOSIS — K7689 Other specified diseases of liver: Secondary | ICD-10-CM | POA: Diagnosis not present

## 2021-01-18 DIAGNOSIS — K7581 Nonalcoholic steatohepatitis (NASH): Secondary | ICD-10-CM | POA: Insufficient documentation

## 2021-01-18 DIAGNOSIS — K7400 Hepatic fibrosis, unspecified: Secondary | ICD-10-CM | POA: Diagnosis not present

## 2021-02-10 ENCOUNTER — Other Ambulatory Visit (HOSPITAL_COMMUNITY): Payer: Self-pay

## 2021-02-10 DIAGNOSIS — D696 Thrombocytopenia, unspecified: Secondary | ICD-10-CM

## 2021-02-13 ENCOUNTER — Other Ambulatory Visit: Payer: Self-pay

## 2021-02-13 ENCOUNTER — Inpatient Hospital Stay (HOSPITAL_COMMUNITY): Payer: HMO | Attending: Hematology

## 2021-02-13 DIAGNOSIS — Z7951 Long term (current) use of inhaled steroids: Secondary | ICD-10-CM | POA: Insufficient documentation

## 2021-02-13 DIAGNOSIS — Z87891 Personal history of nicotine dependence: Secondary | ICD-10-CM | POA: Insufficient documentation

## 2021-02-13 DIAGNOSIS — D696 Thrombocytopenia, unspecified: Secondary | ICD-10-CM | POA: Diagnosis not present

## 2021-02-13 DIAGNOSIS — E119 Type 2 diabetes mellitus without complications: Secondary | ICD-10-CM | POA: Insufficient documentation

## 2021-02-13 DIAGNOSIS — I10 Essential (primary) hypertension: Secondary | ICD-10-CM | POA: Diagnosis not present

## 2021-02-13 DIAGNOSIS — E785 Hyperlipidemia, unspecified: Secondary | ICD-10-CM | POA: Insufficient documentation

## 2021-02-13 DIAGNOSIS — Z7984 Long term (current) use of oral hypoglycemic drugs: Secondary | ICD-10-CM | POA: Insufficient documentation

## 2021-02-13 DIAGNOSIS — Z79899 Other long term (current) drug therapy: Secondary | ICD-10-CM | POA: Diagnosis not present

## 2021-02-13 DIAGNOSIS — J449 Chronic obstructive pulmonary disease, unspecified: Secondary | ICD-10-CM | POA: Diagnosis not present

## 2021-02-13 DIAGNOSIS — Z7982 Long term (current) use of aspirin: Secondary | ICD-10-CM | POA: Diagnosis not present

## 2021-02-13 LAB — COMPREHENSIVE METABOLIC PANEL
ALT: 34 U/L (ref 0–44)
AST: 23 U/L (ref 15–41)
Albumin: 4.3 g/dL (ref 3.5–5.0)
Alkaline Phosphatase: 89 U/L (ref 38–126)
Anion gap: 6 (ref 5–15)
BUN: 14 mg/dL (ref 8–23)
CO2: 27 mmol/L (ref 22–32)
Calcium: 8.9 mg/dL (ref 8.9–10.3)
Chloride: 103 mmol/L (ref 98–111)
Creatinine, Ser: 0.75 mg/dL (ref 0.61–1.24)
GFR, Estimated: >60 mL/min (ref >60)
Glucose, Bld: 144 mg/dL — ABNORMAL HIGH (ref 70–99)
Potassium: 4.5 mmol/L (ref 3.5–5.1)
Sodium: 136 mmol/L (ref 135–145)
Total Bilirubin: 1.2 mg/dL (ref 0.3–1.2)
Total Protein: 8.4 g/dL — ABNORMAL HIGH (ref 6.5–8.1)

## 2021-02-13 LAB — CBC WITH DIFFERENTIAL/PLATELET
Abs Immature Granulocytes: 0.03 10*3/uL (ref 0.00–0.07)
Basophils Absolute: 0 10*3/uL (ref 0.0–0.1)
Basophils Relative: 1 %
Eosinophils Absolute: 0.2 10*3/uL (ref 0.0–0.5)
Eosinophils Relative: 4 %
HCT: 52 % (ref 39.0–52.0)
Hemoglobin: 16.3 g/dL (ref 13.0–17.0)
Immature Granulocytes: 1 %
Lymphocytes Relative: 24 %
Lymphs Abs: 1.3 10*3/uL (ref 0.7–4.0)
MCH: 25.9 pg — ABNORMAL LOW (ref 26.0–34.0)
MCHC: 31.3 g/dL (ref 30.0–36.0)
MCV: 82.7 fL (ref 80.0–100.0)
Monocytes Absolute: 0.4 10*3/uL (ref 0.1–1.0)
Monocytes Relative: 8 %
Neutro Abs: 3.4 10*3/uL (ref 1.7–7.7)
Neutrophils Relative %: 62 %
Platelets: 82 10*3/uL — ABNORMAL LOW (ref 150–400)
RBC: 6.29 MIL/uL — ABNORMAL HIGH (ref 4.22–5.81)
RDW: 17 % — ABNORMAL HIGH (ref 11.5–15.5)
WBC: 5.4 10*3/uL (ref 4.0–10.5)
nRBC: 0 % (ref 0.0–0.2)

## 2021-02-20 ENCOUNTER — Ambulatory Visit (HOSPITAL_COMMUNITY): Payer: HMO | Admitting: Hematology

## 2021-02-20 ENCOUNTER — Inpatient Hospital Stay (HOSPITAL_COMMUNITY): Payer: HMO | Admitting: Nurse Practitioner

## 2021-02-20 ENCOUNTER — Other Ambulatory Visit: Payer: Self-pay

## 2021-02-20 VITALS — BP 119/66 | HR 92 | Temp 98.1°F | Resp 20 | Wt 167.1 lb

## 2021-02-20 DIAGNOSIS — D696 Thrombocytopenia, unspecified: Secondary | ICD-10-CM | POA: Diagnosis not present

## 2021-02-20 NOTE — Patient Instructions (Signed)
[place] at [facility] Discharge Instructions  You were seen today by Beckey Rutter, NP. She reviewed your recent lab work. She recommended continued observation, but on a more frequent basis. Follow-up in about 3 months.   Thank you for choosing [place] at [facility] to provide your oncology and hematology care.  To afford each patient quality time with our provider, please arrive at least 15 minutes before your scheduled appointment time.   If you have a lab appointment with the [facility] please come in thru the Main Entrance and check in at the main information desk.  You need to re-schedule your appointment should you arrive 10 or more minutes late.  We strive to give you quality time with our providers, and arriving late affects you and other patients whose appointments are after yours.  Also, if you no show three or more times for appointments you may be dismissed from the clinic at the providers discretion.     Again, thank you for choosing [facility].  Our hope is that these requests will decrease the amount of time that you wait before being seen by our physicians.       _____________________________________________________________  Should you have questions after your visit to [facility], please contact our office at [phone] and follow the prompts.  Our office hours are 8:00 a.m. and 4:30 p.m. Monday - Friday.  Please note that voicemails left after 4:00 p.m. may not be returned until the following business day.  We are closed weekends and major holidays.  You do have access to a nurse 24-7, just call the main number to the clinic [phone] and do not press any options, hold on the line and a nurse will answer the phone.    For prescription refill requests, have your pharmacy contact our office and allow 72 hours.    Due to Covid, you will need to wear a mask upon entering the hospital. If you do  not have a mask, a mask will be given to you at the Main Entrance upon arrival. For doctor visits, patients may have 1 support person age 40 or older with them. For treatment visits, patients can not have anyone with them due to social distancing guidelines and our immunocompromised population.

## 2021-02-20 NOTE — Progress Notes (Signed)
[name], [name] [id number]   Virtual Visit Progress Note  I connected with Gloriajean Dell on 02/20/21 at 10:40 AM EDT by video enabled telemedicine visit and verified that I am speaking with the correct person using two identifiers.   I discussed the limitations, risks, security and privacy concerns of performing an evaluation and management service by telemedicine and the availability of in-person appointments. I also discussed with the patient that there may be a patient responsible charge related to this service. The patient expressed understanding and agreed to proceed.   Other persons participating in the visit and their role in the encounter: NP, RN, Patient   Patient's location: [facility]  Provider's location: [facility]   CLINIC:  Medical Oncology/Hematology  PCP:  Asencion Noble, MD [address] [place] [address] [phone]   REASON FOR VISIT: Follow-up for thrombocytopenia   CURRENT THERAPY: Observation   INTERVAL HISTORY:  [name] 65 y.o. male returns for routine follow-up for thrombocytopenia. He denies new bleeding. Continues to bruise easily. No petechial rashes or purpura.  No melena or hematochezia.  No nausea, vomiting, abdominal pain.  Persistent fatigue.  REVIEW OF SYSTEMS:  Review of Systems  Constitutional:  Negative for appetite change, fatigue and unexpected weight change.  HENT:   Negative for mouth sores, sore throat and trouble swallowing.   Respiratory:  Negative for chest tightness and shortness of breath.        Chronic oxygen use  Cardiovascular:  Negative for leg swelling.  Gastrointestinal:  Negative for abdominal pain, constipation, diarrhea, nausea and vomiting.  Genitourinary:  Negative for bladder incontinence and dysuria.   Musculoskeletal:  Negative for flank pain and neck stiffness.  Skin:  Negative for itching, rash and wound.  Neurological:  Negative for dizziness, headaches,  light-headedness and numbness.  Hematological:  Negative for adenopathy. Bruises/bleeds easily.  Psychiatric/Behavioral:  Positive for depression. Negative for confusion and sleep disturbance. The patient is nervous/anxious.     PAST MEDICAL/SURGICAL HISTORY:  Past Medical History:  Diagnosis Date   Anxiety    Asthma    Chest pain    a. 2003 Cath: nl cors.   COPD (chronic obstructive pulmonary disease) ([name])    chronically on 3-4L [place]   Depression    Diabetes mellitus    Dyslipidemia    Dysrhythmia    Fracture 12/15   lower back "L-1"   GERD (gastroesophageal reflux disease)    Hypertension    NASH (nonalcoholic steatohepatitis) 2018   [name], undergoing routine cirrhosis care   PONV (postoperative nausea and vomiting)    Splenomegaly    Past Surgical History:  Procedure Laterality Date   BIOPSY  07/26/2016   Procedure: BIOPSY;  Surgeon: [name], MD;  Location: [facility];  Service: Endoscopy;;  gastric   CARDIAC CATHETERIZATION  10/22/2001   normal coronary arteriea (Dr. Domenic Moras)   COLONOSCOPY  08/31/2011   OEU:[id number] rectal and colon polyps-treated. Single tubular adenoma. Next TCS 08/2016   COLONOSCOPY N/A 07/26/2016   Dr. [name]: diverticulosis in sigmoid and descending colon. 6 mm benign splenic flexure. surveillance 5 yeras   ESOPHAGOGASTRODUODENOSCOPY N/A 07/26/2016   Dr. [name]: normal esophagus, portal gastropathy, chronic gastritis, normal duodenum, screening EGD 2 years    ESOPHAGOGASTRODUODENOSCOPY (EGD) WITH ESOPHAGEAL DILATION N/A 05/15/2013   RXV:QMGQQP esophagus-status post Venia Minks dilation/Portal gastropathy. Antral erosions-status post biopsy. extrinsic compression along lesser curvature likely secondary to splenomegaly. gastric bx benign  ESOPHAGOGASTRODUODENOSCOPY (EGD) WITH PROPOFOL N/A 12/12/2020   Surgeon: [name], MD; ormal esophagus s/p dilation, portal hypertensive gastropathy.  Due for repeat in 2024.   HERNIA REPAIR   03/2009   MALONEY DILATION N/A 12/12/2020   Procedure: Venia Minks DILATION;  Surgeon: [name], MD;  Location: [facility];  Service: Endoscopy;  Laterality: N/A;   POLYPECTOMY  07/26/2016   Procedure: POLYPECTOMY;  Surgeon: [name], MD;  Location: [facility];  Service: Endoscopy;;  colon   TRANSTHORACIC ECHOCARDIOGRAM  2011   EF 84-53%, stage 1 diastolic dysfunction, trace TR & pulm valve regurg    VASECTOMY  1989     SOCIAL HISTORY:  Social History   Socioeconomic History   Marital status: Married    Spouse name: Not on file   Number of children: Not on file   Years of education: Not on file   Highest education level: Not on file  Occupational History   Not on file  Tobacco Use   Smoking status: Former    Packs/day: 1.25    Years: 36.00    Pack years: 45.00    Types: Cigarettes    Quit date: 04/2011    Years since quitting: 9.8   Smokeless tobacco: Former  Scientific laboratory technician Use: Never used  Substance and Sexual Activity   Alcohol use: No   Drug use: No   Sexual activity: Not on file  Other Topics Concern   Not on file  Social History Narrative   Not on file   Social Determinants of Health   Financial Resource Strain: Low Risk    Difficulty of Paying Living Expenses: Not hard at all  Food Insecurity: No Food Insecurity   Worried About Charity fundraiser in the Last Year: Never true   Arboriculturist in the Last Year: Never true  Transportation Needs: No Transportation Needs   Lack of Transportation (Medical): No   Lack of Transportation (Non-Medical): No  Physical Activity: Sufficiently Active   Days of Exercise per Week: 7 days   Minutes of Exercise per Session: 30 min  Stress: No Stress Concern Present   Feeling of Stress : Not at all  Social Connections: Moderately Integrated   Frequency of Communication with Friends and Family: Three times a week   Frequency of Social Gatherings with Friends and Family: Three times a week   Attends  Religious Services: More than 4 times per year   Active Member of Clubs or Organizations: No   Attends Archivist Meetings: Never   Marital Status: Married  Human resources officer Violence: Not At Risk   Fear of Current or Ex-Partner: No   Emotionally Abused: No   Physically Abused: No   Sexually Abused: No    FAMILY HISTORY:  Family History  Problem Relation Age of Onset   Heart disease Mother    Cancer Mother        lymph nodes   Diabetes Mother    Arthritis Other    Lung disease Other    Asthma Other    Kidney disease Other    Ovarian cancer Sister    Diabetes Brother    Heart disease Brother    Hypertension Brother    Diabetes Sister    Diabetes Sister    Cirrhosis Sister 38       no etoh   Colon cancer Neg Hx     CURRENT MEDICATIONS:  Outpatient Encounter Medications as of  02/20/2021  Medication Sig Note   acetaminophen (TYLENOL) 500 MG tablet Take 1,000 mg by mouth as needed for headache.    albuterol (PROVENTIL) (2.5 MG/3ML) 0.083% nebulizer solution Take 3 mLs (2.5 mg total) by nebulization every 2 (two) hours as needed for wheezing or shortness of breath (cough). (Patient taking differently: Take 2.5 mg by nebulization as needed for wheezing or shortness of breath (cough).)    ALPRAZolam (XANAX) 0.25 MG tablet Take 0.25 mg by mouth 2 (two) times daily as needed for sleep.     aspirin EC 81 MG tablet Take 1 tablet (81 mg total) by mouth daily with breakfast.    atorvastatin (LIPITOR) 40 MG tablet Take 40 mg by mouth daily.    Blood Glucose Monitoring Suppl (ONETOUCH VERIO) w/Device KIT 2 (two) times daily.    budesonide-formoterol (SYMBICORT) 160-4.5 MCG/ACT inhaler Inhale 2 puffs into the lungs 2 (two) times daily.    Cholecalciferol (VITAMIN D3) 25 MCG (1000 UT) CAPS Take 1 capsule by mouth daily.    [employer] VITAMIN C 250 MG tablet Take 250 mg by mouth daily.    diltiazem (CARDIZEM CD) 240 MG 24 hr capsule Take 240 mg by mouth daily.    gabapentin (NEURONTIN)  300 MG capsule Take 1 capsule (300 mg total) by mouth at bedtime.    glimepiride (AMARYL) 1 MG tablet Take 1 mg by mouth daily with breakfast.    guaiFENesin (MUCINEX) 600 MG 12 hr tablet Take 1 tablet (600 mg total) by mouth 2 (two) times daily. (Patient taking differently: Take 600 mg by mouth as needed.)    guaiFENesin-dextromethorphan (ROBITUSSIN DM) 100-10 MG/5ML syrup Take 5 mLs by mouth every 4 (four) hours as needed for cough (chest congestion). (Patient taking differently: Take 5 mLs by mouth as needed for cough (chest congestion).)    JARDIANCE 25 MG TABS tablet Take 25 mg by mouth every evening.     Multiple Vitamin (MULTIVITAMIN WITH MINERALS) TABS tablet Take 1 tablet by mouth at bedtime.    ONETOUCH DELICA LANCETS 40J MISC     ONETOUCH VERIO test strip     OXYGEN Inhale 2 L into the lungs continuous.    pantoprazole (PROTONIX) 40 MG tablet Take 1 tablet (40 mg total) by mouth daily.    PROAIR HFA 108 (90 Base) MCG/ACT inhaler Inhale 2 puffs into the lungs every 4 (four) hours as needed for wheezing or shortness of breath.    sertraline (ZOLOFT) 50 MG tablet Take 1 tablet (50 mg total) by mouth daily.    SitaGLIPtin-MetFORMIN HCl [phone] MG TB24 Take 1 tablet by mouth daily.    sodium chloride (OCEAN) 0.65 % SOLN nasal spray Place 1 spray into both nostrils as needed for congestion.    tamsulosin (FLOMAX) 0.4 MG CAPS capsule Take 0.4 mg by mouth daily.    thiamine 100 MG tablet Take 1 tablet (100 mg total) by mouth daily. 11/30/2020: B-1   Tiotropium Bromide Monohydrate (SPIRIVA RESPIMAT) 2.5 MCG/ACT AERS Inhale 2 puffs into the lungs daily.    [DISCONTINUED] diltiazem (DILACOR XR) 240 MG 24 hr capsule Take 240 mg by mouth daily.    No facility-administered encounter medications on file as of 02/20/2021.    ALLERGIES:  Allergies  Allergen Reactions   Baclofen Other (See Comments)    lethargic    Simvastatin Nausea Only     PHYSICAL EXAM:  ECOG Performance status:  1 Vitals:   02/20/21 1054  BP: 119/66  Pulse: 92  Resp: 20  Temp: 98.1 F (36.7 C)  SpO2: 96%   Filed Weights   02/20/21 1054  Weight: 167 lb 1.7 oz (75.8 kg)   Physical Exam Vitals reviewed.  Constitutional:      Appearance: He is not ill-appearing.  Pulmonary:     Effort: No respiratory distress.  Skin:    Coloration: Skin is not pale.  Neurological:     Mental Status: He is alert and oriented to person, place, and time.  Psychiatric:        Mood and Affect: Mood normal.        Behavior: Behavior normal.     LABORATORY DATA:  I have reviewed the labs as listed.  CBC    Component Value Date/Time   WBC 5.4 02/13/2021 1302   RBC 6.29 (H) 02/13/2021 1302   HGB 16.3 02/13/2021 1302   HCT 52.0 02/13/2021 1302   PLT 82 (L) 02/13/2021 1302   MCV 82.7 02/13/2021 1302   MCH 25.9 (L) 02/13/2021 1302   MCHC 31.3 02/13/2021 1302   RDW 17.0 (H) 02/13/2021 1302   LYMPHSABS 1.3 02/13/2021 1302   MONOABS 0.4 02/13/2021 1302   EOSABS 0.2 02/13/2021 1302   BASOSABS 0.0 02/13/2021 1302   CMP Latest Ref Rng & Units 02/13/2021 12/08/2020 09/05/2020  Glucose 70 - 99 mg/dL 144(H) 243(H) 211(H)  BUN 8 - 23 mg/dL 14 19 20   Creatinine 0.61 - 1.24 mg/dL 0.75 0.75 0.58(L)  Sodium 135 - 145 mmol/L 136 136 142  Potassium 3.5 - 5.1 mmol/L 4.5 3.8 4.3  Chloride 98 - 111 mmol/L 103 103 105  CO2 22 - 32 mmol/L 27 25 27   Calcium 8.9 - 10.3 mg/dL 8.9 9.0 9.2  Total Protein 6.5 - 8.1 g/dL 8.4(H) 8.0 -  Total Bilirubin 0.3 - 1.2 mg/dL 1.2 0.8 -  Alkaline Phos 38 - 126 U/L 89 95 -  AST 15 - 41 U/L 23 20 -  ALT 0 - 44 U/L 34 27 -    ASSESSMENT & PLAN:  1.  Mild to moderate thrombocytopenia: -He has thrombocytopenia since 2010.  White count and hemoglobin are normal. -PET scan on 08/26/2017 for follow-up lung nodule showed fatty liver with normal spleen. -Liver biopsy on 02/21/2017 showed moderate to severe steatosis. -Etiology of thrombocytopenia includes liver disease versus ITP. -He takes  an aspirin 81 mg daily.  He reports easy bruising on the upper extremities which is stable. - US abdomen RUQ from 01/20/21 showed echogenic liver consistent with steatosis. No focal lesions or acute findings. Previously, imaging from 07/19/20 showed splenomegaly with spleen measuring 15.2 cm with volume of 702.7cc.  - Labs from 02/13/21 showed platelet count of 82.- Clinically, he has mild bruising which is likely secondary to combination of aspirin and thrombocytopenia. He has not had spontaneous bleeding of gums, nose, or petechia or purpura.  - blood smear previously noted normal appearing platelets.  - continue observation. Consider additional intervention is platelet count drops less than 50,000.   2.  Lung nodules: - Patient smoked 30 years, at least 1-1/2 packs/day.  He quit 7 to 8 years ago. - CT low-dose on 02/03/2020 was lung RADS 2.   - Patient had CT angio while hospitalized on 04/17/2020 which showed interval improvement of groundglass nodularity in the right middle lobe.   - Recommend annual LDCT  RTC in 3 months with labs, see MD   I discussed the assessment and treatment plan with the patient. The patient was provided an opportunity to ask questions  and all were answered. The patient agreed with the plan and demonstrated an understanding of the instructions.   The patient was advised to call back or seek an in-person evaluation if the symptoms worsen or if the condition fails to improve as anticipated.   I spent 20 minutes face-to-face video visit time dedicated to the care of this patient on the date of this encounter to include pre-visit review of hematology notes, labs, face-to-face time with the patient, and post visit ordering of testing/documentation.   Beckey Rutter, DNP, AGNP-C [place] [phone]

## 2021-02-24 DIAGNOSIS — E785 Hyperlipidemia, unspecified: Secondary | ICD-10-CM | POA: Diagnosis not present

## 2021-02-24 DIAGNOSIS — K219 Gastro-esophageal reflux disease without esophagitis: Secondary | ICD-10-CM | POA: Diagnosis not present

## 2021-02-24 DIAGNOSIS — J449 Chronic obstructive pulmonary disease, unspecified: Secondary | ICD-10-CM | POA: Diagnosis not present

## 2021-02-24 DIAGNOSIS — Z79899 Other long term (current) drug therapy: Secondary | ICD-10-CM | POA: Diagnosis not present

## 2021-02-24 DIAGNOSIS — E1140 Type 2 diabetes mellitus with diabetic neuropathy, unspecified: Secondary | ICD-10-CM | POA: Diagnosis not present

## 2021-02-24 DIAGNOSIS — I10 Essential (primary) hypertension: Secondary | ICD-10-CM | POA: Diagnosis not present

## 2021-03-09 ENCOUNTER — Other Ambulatory Visit: Payer: Self-pay

## 2021-03-09 ENCOUNTER — Ambulatory Visit (HOSPITAL_COMMUNITY)
Admission: RE | Admit: 2021-03-09 | Discharge: 2021-03-09 | Disposition: A | Discharge status: Home / Self Care | Payer: HMO | Source: Ambulatory Visit | Attending: Internal Medicine | Admitting: Internal Medicine

## 2021-03-09 ENCOUNTER — Other Ambulatory Visit: Payer: Self-pay | Admitting: Internal Medicine

## 2021-03-09 ENCOUNTER — Other Ambulatory Visit (HOSPITAL_COMMUNITY): Payer: Self-pay | Admitting: Internal Medicine

## 2021-03-09 DIAGNOSIS — S0990XA Unspecified injury of head, initial encounter: Secondary | ICD-10-CM | POA: Insufficient documentation

## 2021-03-09 DIAGNOSIS — R7309 Other abnormal glucose: Secondary | ICD-10-CM | POA: Diagnosis not present

## 2021-03-09 DIAGNOSIS — R519 Headache, unspecified: Secondary | ICD-10-CM | POA: Diagnosis not present

## 2021-03-09 DIAGNOSIS — E1140 Type 2 diabetes mellitus with diabetic neuropathy, unspecified: Secondary | ICD-10-CM | POA: Diagnosis not present

## 2021-03-09 DIAGNOSIS — E785 Hyperlipidemia, unspecified: Secondary | ICD-10-CM | POA: Diagnosis not present

## 2021-03-09 DIAGNOSIS — K4030 Unilateral inguinal hernia, with obstruction, without gangrene, not specified as recurrent: Secondary | ICD-10-CM | POA: Diagnosis not present

## 2021-03-10 ENCOUNTER — Encounter: Payer: Self-pay | Admitting: Internal Medicine

## 2021-03-15 ENCOUNTER — Other Ambulatory Visit: Payer: Self-pay | Admitting: Family Medicine

## 2021-03-15 DIAGNOSIS — K4090 Unilateral inguinal hernia, without obstruction or gangrene, not specified as recurrent: Secondary | ICD-10-CM

## 2021-03-30 ENCOUNTER — Encounter: Payer: Self-pay | Admitting: General Surgery

## 2021-03-30 ENCOUNTER — Ambulatory Visit: Payer: HMO | Admitting: General Surgery

## 2021-03-30 ENCOUNTER — Other Ambulatory Visit: Payer: Self-pay

## 2021-03-30 VITALS — BP 111/68 | HR 86 | Temp 98.0°F | Resp 18 | Ht 67.0 in | Wt 171.0 lb

## 2021-03-30 DIAGNOSIS — K4090 Unilateral inguinal hernia, without obstruction or gangrene, not specified as recurrent: Secondary | ICD-10-CM

## 2021-03-30 NOTE — Progress Notes (Signed)
[name]; [id number]; Dec 31, 1955   HPI Patient is a 65 year old white male who was referred to my care by Dr. Asencion Noble for evaluation and treatment of a left inguinal hernia.  Patient states he has had a known left inguinal hernia for many years.  He is status post a right inguinal herniorrhaphy by me in 2010.  He states that occasionally he has left groin pain when he is lifting something heavy.  He does lead a sedate life as he is on 3 L nasal cannula O2 for COPD.  He states his COPD is stable.  He denies any nausea or vomiting.  He denies any constant pain in the left groin.  He also has an umbilical hernia that has not caused him issues.  He states it does stick out all the time. Past Medical History:  Diagnosis Date   Anxiety    Asthma    Chest pain    a. 2003 Cath: nl cors.   COPD (chronic obstructive pulmonary disease) ([name])    chronically on 3-4L [place]   Depression    Diabetes mellitus    Dyslipidemia    Dysrhythmia    Fracture 12/15   lower back "L-1"   GERD (gastroesophageal reflux disease)    Hypertension    NASH (nonalcoholic steatohepatitis) 2018   [name], undergoing routine cirrhosis care   PONV (postoperative nausea and vomiting)    Splenomegaly     Past Surgical History:  Procedure Laterality Date   BIOPSY  07/26/2016   Procedure: BIOPSY;  Surgeon: [name], MD;  [facility]: [facility];  Service: Endoscopy;;  gastric   CARDIAC CATHETERIZATION  10/22/2001   normal coronary arteriea (Dr. Domenic Moras)   COLONOSCOPY  08/31/2011   [id number] rectal and colon polyps-treated. Single tubular adenoma. Next TCS 08/2016   COLONOSCOPY N/A 07/26/2016   Dr. [name]: diverticulosis in sigmoid and descending colon. 6 mm benign splenic flexure. surveillance 5 yeras   ESOPHAGOGASTRODUODENOSCOPY N/A 07/26/2016   Dr. [name]: normal esophagus, portal gastropathy, chronic gastritis, normal duodenum, screening EGD 2 years    ESOPHAGOGASTRODUODENOSCOPY (EGD) WITH ESOPHAGEAL  DILATION N/A 05/15/2013   [id number]:[id number] esophagus-status post Venia Minks dilation/Portal gastropathy. Antral erosions-status post biopsy. extrinsic compression along lesser curvature likely secondary to splenomegaly. gastric bx benign   ESOPHAGOGASTRODUODENOSCOPY (EGD) WITH PROPOFOL N/A 12/12/2020   Surgeon: [name], MD; ormal esophagus s/p dilation, portal hypertensive gastropathy.  Due for repeat in 2024.   HERNIA REPAIR  03/2009   MALONEY DILATION N/A 12/12/2020   Procedure: Venia Minks DILATION;  Surgeon: [name], MD;  [facility]: [facility];  Service: Endoscopy;  Laterality: N/A;   POLYPECTOMY  07/26/2016   Procedure: POLYPECTOMY;  Surgeon: [name], MD;  [facility]: [facility];  Service: Endoscopy;;  colon   TRANSTHORACIC ECHOCARDIOGRAM  2011   EF 47-42%, stage 1 diastolic dysfunction, trace TR & pulm valve regurg    VASECTOMY  1989    Family History  Problem Relation Age of Onset   Heart disease Mother    Cancer Mother        lymph nodes   Diabetes Mother    Arthritis Other    Lung disease Other    Asthma Other    Kidney disease Other    Ovarian cancer Sister    Diabetes Brother    Heart disease Brother    Hypertension Brother    Diabetes Sister    Diabetes Sister    Cirrhosis Sister 49  no etoh   Colon cancer Neg Hx     Current Outpatient Medications on File Prior to Visit  Medication Sig Dispense Refill   acetaminophen (TYLENOL) 500 MG tablet Take 1,000 mg by mouth as needed for headache.     albuterol (PROVENTIL) (2.5 MG/3ML) 0.083% nebulizer solution Take 3 mLs (2.5 mg total) by nebulization every 2 (two) hours as needed for wheezing or shortness of breath (cough). (Patient taking differently: Take 2.5 mg by nebulization as needed for wheezing or shortness of breath (cough).) 75 mL 1   ALPRAZolam (XANAX) 0.25 MG tablet Take 0.25 mg by mouth 2 (two) times daily as needed for sleep.      aspirin EC 81 MG tablet Take 1 tablet (81 mg total) by mouth  daily with breakfast. 30 tablet 5   atorvastatin (LIPITOR) 40 MG tablet Take 40 mg by mouth daily.     Blood Glucose Monitoring Suppl (ONETOUCH VERIO) w/Device KIT 2 (two) times daily.     budesonide-formoterol (SYMBICORT) 160-4.5 MCG/ACT inhaler Inhale 2 puffs into the lungs 2 (two) times daily.     Cholecalciferol (VITAMIN D3) 25 MCG (1000 UT) CAPS Take 1 capsule by mouth daily.     [employer] VITAMIN C 250 MG tablet Take 250 mg by mouth daily.     diltiazem (CARDIZEM CD) 240 MG 24 hr capsule Take 240 mg by mouth daily.     gabapentin (NEURONTIN) 300 MG capsule Take 1 capsule (300 mg total) by mouth at bedtime. 30 capsule 5   glimepiride (AMARYL) 1 MG tablet Take 1 mg by mouth daily with breakfast.     guaiFENesin (MUCINEX) 600 MG 12 hr tablet Take 1 tablet (600 mg total) by mouth 2 (two) times daily. (Patient taking differently: Take 600 mg by mouth as needed.) 20 tablet 0   guaiFENesin-dextromethorphan (ROBITUSSIN DM) 100-10 MG/5ML syrup Take 5 mLs by mouth every 4 (four) hours as needed for cough (chest congestion). (Patient taking differently: Take 5 mLs by mouth as needed for cough (chest congestion).) 118 mL 0   JARDIANCE 25 MG TABS tablet Take 25 mg by mouth every evening.   3   Multiple Vitamin (MULTIVITAMIN WITH MINERALS) TABS tablet Take 1 tablet by mouth at bedtime. 30 tablet 2   ONETOUCH DELICA LANCETS 16X MISC      ONETOUCH VERIO test strip      OXYGEN Inhale 2 L into the lungs continuous.     pantoprazole (PROTONIX) 40 MG tablet Take 1 tablet (40 mg total) by mouth daily. 90 tablet 3   PROAIR HFA 108 (90 Base) MCG/ACT inhaler Inhale 2 puffs into the lungs every 4 (four) hours as needed for wheezing or shortness of breath. 18 g 12   sertraline (ZOLOFT) 50 MG tablet Take 1 tablet (50 mg total) by mouth daily. 30 tablet 3   SitaGLIPtin-MetFORMIN HCl [phone] MG TB24 Take 1 tablet by mouth daily.     sodium chloride (OCEAN) 0.65 % SOLN nasal spray Place 1 spray into both nostrils as needed  for congestion. 60 mL 0   tamsulosin (FLOMAX) 0.4 MG CAPS capsule Take 0.4 mg by mouth daily.     thiamine 100 MG tablet Take 1 tablet (100 mg total) by mouth daily. 30 tablet 2   Tiotropium Bromide Monohydrate (SPIRIVA RESPIMAT) 2.5 MCG/ACT AERS Inhale 2 puffs into the lungs daily.     No current facility-administered medications on file prior to visit.    Allergies  Allergen Reactions   Baclofen Other (  See Comments)    lethargic    Simvastatin Nausea Only    Social History   Substance and Sexual Activity  Alcohol Use No    Social History   Tobacco Use  Smoking Status Former   Packs/day: 1.25   Years: 36.00   Pack years: 45.00   Types: Cigarettes   Quit date: 04/2011   Years since quitting: 9.9  Smokeless Tobacco Former    Review of Systems  Constitutional: Negative.   HENT:  Positive for sinus pain.   Eyes: Negative.   Respiratory:  Positive for cough, shortness of breath and wheezing.   Cardiovascular: Negative.   Gastrointestinal:  Positive for abdominal pain.  Genitourinary:  Positive for frequency.  Skin: Negative.   Neurological: Negative.   Endo/Heme/Allergies: Negative.   Psychiatric/Behavioral: Negative.     Objective   Vitals:   03/30/21 0948  BP: 111/68  Pulse: 86  Resp: 18  Temp: 98 F (36.7 C)  SpO2: 93%    Physical Exam Vitals reviewed.  Constitutional:      Appearance: Normal appearance. He is not ill-appearing.  HENT:     Head: Normocephalic and atraumatic.  Cardiovascular:     Rate and Rhythm: Normal rate and regular rhythm.     Heart sounds: Normal heart sounds. No murmur heard.   No friction rub. No gallop.  Pulmonary:     Effort: Pulmonary effort is normal. No respiratory distress.     Breath sounds: Normal breath sounds. No stridor. No wheezing, rhonchi or rales.     Comments: Wearing nasal cannula. Abdominal:     General: Bowel sounds are normal. There is no distension.     Palpations: Abdomen is soft. There is no mass.      Tenderness: There is no abdominal tenderness. There is no guarding or rebound.     Hernia: A hernia is present.     Comments: Patient does have an easily reducible left inguinal hernia.  An easily reducible umbilical hernia is also present.  Skin:    General: Skin is warm and dry.  Neurological:     Mental Status: He is alert and oriented to person, place, and time.   Primary care notes reviewed Assessment  Left inguinal hernia, umbilical hernia, COPD with home oxygen requirements Plan  I told the patient that he is at increased risk for complications due to his COPD.  I do not think that his hernia needs to be immediately fixed as he is not too symptomatic.  I told the patient and his wife that I would only recommend repair when he is significantly affecting his daily lifestyle.  He understands and agrees.  May need to do any surgery under spinal anesthetic given his COPD.  Literature was given.  He will return to my care should he becomes more symptomatic.  Follow-up as needed.

## 2021-04-07 DIAGNOSIS — I10 Essential (primary) hypertension: Secondary | ICD-10-CM | POA: Diagnosis not present

## 2021-04-07 DIAGNOSIS — E1140 Type 2 diabetes mellitus with diabetic neuropathy, unspecified: Secondary | ICD-10-CM | POA: Diagnosis not present

## 2021-04-07 DIAGNOSIS — E785 Hyperlipidemia, unspecified: Secondary | ICD-10-CM | POA: Diagnosis not present

## 2021-05-08 DIAGNOSIS — E1140 Type 2 diabetes mellitus with diabetic neuropathy, unspecified: Secondary | ICD-10-CM | POA: Diagnosis not present

## 2021-05-08 DIAGNOSIS — I10 Essential (primary) hypertension: Secondary | ICD-10-CM | POA: Diagnosis not present

## 2021-05-08 DIAGNOSIS — E785 Hyperlipidemia, unspecified: Secondary | ICD-10-CM | POA: Diagnosis not present

## 2021-05-22 DIAGNOSIS — R0602 Shortness of breath: Secondary | ICD-10-CM | POA: Diagnosis not present

## 2021-05-23 NOTE — Progress Notes (Signed)
[name], [place] [address]   CLINIC:  Medical Oncology/Hematology  PCP:  Asencion Noble, MD [address] [place] [address] [phone]   REASON FOR VISIT:  Follow-up for thrombocytopenia  CURRENT THERAPY: Observation  INTERVAL HISTORY:  [name] 65 y.o. male returns for routine follow-up of his thrombocytopenia.  He was last evaluated via telemedicine visit by NP Beckey Rutter on 02/20/2021.  At today's visit, he reports feeling fairly well.  No recent hospitalizations, surgeries, or changes in baseline health status.  He was sick with an upper respiratory infection last week, but is feeling better after finishing his levofloxacin and prednisone.  He reports that he has scant episodes of epistaxis related to dry nasal cavity from submental oxygen via nasal cannula.  He denies any major episodes of epistaxis.  No other bleeding episodes such as hematuria, bleeding gums, hematemesis, hematochezia, or melena. He admits to easy bruising but denies any petechial rash. No nausea, vomiting, abdominal pain. He reports chronic fatigue, with energy about 65% and stable at his baseline. He denies any B symptoms such as fever, chills, night sweats, unintentional weight loss.  He is on chronic supplemental oxygen due to his underlying COPD.  He has 65% energy and 100% appetite. He endorses that he is maintaining a stable weight.    REVIEW OF SYSTEMS:  Review of Systems  Constitutional:  Positive for fatigue (Chronic and at baseline). Negative for appetite change, chills, diaphoresis, fever and unexpected weight change.  HENT:   Negative for lump/mass and nosebleeds.   Eyes:  Negative for eye problems.  Respiratory:  Positive for shortness of breath (Chronic and at baseline). Negative for cough and hemoptysis.   Cardiovascular:  Negative for chest pain, leg swelling and palpitations.  Gastrointestinal:  Negative for abdominal pain,  blood in stool, constipation, diarrhea, nausea and vomiting.  Genitourinary:  Negative for hematuria.   Skin: Negative.   Neurological:  Negative for dizziness, headaches and light-headedness.  Hematological:  Does not bruise/bleed easily.     PAST MEDICAL/SURGICAL HISTORY:  Past Medical History:  Diagnosis Date   Anxiety    Asthma    Chest pain    a. 2003 Cath: nl cors.   COPD (chronic obstructive pulmonary disease) ([name])    chronically on 3-4L [place]   Depression    Diabetes mellitus    Dyslipidemia    Dysrhythmia    Fracture 12/15   lower back "L-1"   GERD (gastroesophageal reflux disease)    Hypertension    NASH (nonalcoholic steatohepatitis) 2018   [name], undergoing routine cirrhosis care   PONV (postoperative nausea and vomiting)    Splenomegaly    Past Surgical History:  Procedure Laterality Date   BIOPSY  07/26/2016   Procedure: BIOPSY;  Surgeon: [name], MD;  [facility];  Service: Endoscopy;;  gastric   CARDIAC CATHETERIZATION  10/22/2001   normal coronary arteriea (Dr. Domenic Moras)   COLONOSCOPY  08/31/2011   [id number] rectal and colon polyps-treated. Single tubular adenoma. Next TCS 08/2016   COLONOSCOPY N/A 07/26/2016   Dr. [name]: diverticulosis in sigmoid and descending colon. 6 mm benign splenic flexure. surveillance 5 yeras   ESOPHAGOGASTRODUODENOSCOPY N/A 07/26/2016   Dr. [name]: normal esophagus, portal gastropathy, chronic gastritis, normal duodenum, screening EGD 2 years    ESOPHAGOGASTRODUODENOSCOPY (EGD) WITH ESOPHAGEAL DILATION N/A 05/15/2013   [id number] esophagus-status post Venia Minks dilation/Portal gastropathy. Antral erosions-status post biopsy. extrinsic compression along lesser curvature  likely secondary to splenomegaly. gastric bx benign   ESOPHAGOGASTRODUODENOSCOPY (EGD) WITH PROPOFOL N/A 12/12/2020   Surgeon: [name], MD; ormal esophagus s/p dilation, portal hypertensive gastropathy.  Due for repeat in 2024.    HERNIA REPAIR  03/2009   MALONEY DILATION N/A 12/12/2020   Procedure: Venia Minks DILATION;  Surgeon: [name], MD;  [facility];  Service: Endoscopy;  Laterality: N/A;   POLYPECTOMY  07/26/2016   Procedure: POLYPECTOMY;  Surgeon: [name], MD;  [facility];  Service: Endoscopy;;  colon   TRANSTHORACIC ECHOCARDIOGRAM  2011   EF 68-25%, stage 1 diastolic dysfunction, trace TR & pulm valve regurg    VASECTOMY  1989     SOCIAL HISTORY:  Social History   Socioeconomic History   Marital status: Married    Spouse name: Not on file   Number of children: Not on file   Years of education: Not on file   Highest education level: Not on file  Occupational History   Not on file  Tobacco Use   Smoking status: Former    Packs/day: 1.25    Years: 36.00    Pack years: 45.00    Types: Cigarettes    Quit date: 04/2011    Years since quitting: 10.1   Smokeless tobacco: Former  Scientific laboratory technician Use: Never used  Substance and Sexual Activity   Alcohol use: No   Drug use: No   Sexual activity: Not on file  Other Topics Concern   Not on file  Social History Narrative   Not on file   Social Determinants of Health   Financial Resource Strain: Low Risk    Difficulty of Paying Living Expenses: Not hard at all  Food Insecurity: No Food Insecurity   Worried About Charity fundraiser in the Last Year: Never true   Arboriculturist in the Last Year: Never true  Transportation Needs: No Transportation Needs   Lack of Transportation (Medical): No   Lack of Transportation (Non-Medical): No  Physical Activity: Sufficiently Active   Days of Exercise per Week: 7 days   Minutes of Exercise per Session: 30 min  Stress: No Stress Concern Present   Feeling of Stress : Not at all  Social Connections: Moderately Integrated   Frequency of Communication with Friends and Family: Three times a week   Frequency of Social Gatherings with Friends and Family: Three times a  week   Attends Religious Services: More than 4 times per year   Active Member of Clubs or Organizations: No   Attends Archivist Meetings: Never   Marital Status: Married  Human resources officer Violence: Not At Risk   Fear of Current or Ex-Partner: No   Emotionally Abused: No   Physically Abused: No   Sexually Abused: No    FAMILY HISTORY:  Family History  Problem Relation Age of Onset   Heart disease Mother    Cancer Mother        lymph nodes   Diabetes Mother    Arthritis Other    Lung disease Other    Asthma Other    Kidney disease Other    Ovarian cancer Sister    Diabetes Brother    Heart disease Brother    Hypertension Brother    Diabetes Sister    Diabetes Sister    Cirrhosis Sister 48       no etoh   Colon cancer Neg Hx  CURRENT MEDICATIONS:  Outpatient Encounter Medications as of 05/24/2021  Medication Sig Note   acetaminophen (TYLENOL) 500 MG tablet Take 1,000 mg by mouth as needed for headache.    albuterol (PROVENTIL) (2.5 MG/3ML) 0.083% nebulizer solution Take 3 mLs (2.5 mg total) by nebulization every 2 (two) hours as needed for wheezing or shortness of breath (cough). (Patient taking differently: Take 2.5 mg by nebulization as needed for wheezing or shortness of breath (cough).)    ALPRAZolam (XANAX) 0.25 MG tablet Take 0.25 mg by mouth 2 (two) times daily as needed for sleep.     aspirin EC 81 MG tablet Take 1 tablet (81 mg total) by mouth daily with breakfast.    atorvastatin (LIPITOR) 40 MG tablet Take 40 mg by mouth daily.    Blood Glucose Monitoring Suppl (ONETOUCH VERIO) w/Device KIT 2 (two) times daily.    budesonide-formoterol (SYMBICORT) 160-4.5 MCG/ACT inhaler Inhale 2 puffs into the lungs 2 (two) times daily.    Cholecalciferol (VITAMIN D3) 25 MCG (1000 UT) CAPS Take 1 capsule by mouth daily.    [employer]S VITAMIN C 250 MG tablet Take 250 mg by mouth daily.    diltiazem (CARDIZEM CD) 240 MG 24 hr capsule Take 240 mg by mouth daily.     gabapentin (NEURONTIN) 300 MG capsule Take 1 capsule (300 mg total) by mouth at bedtime.    glimepiride (AMARYL) 1 MG tablet Take 1 mg by mouth daily with breakfast.    guaiFENesin (MUCINEX) 600 MG 12 hr tablet Take 1 tablet (600 mg total) by mouth 2 (two) times daily. (Patient taking differently: Take 600 mg by mouth as needed.)    guaiFENesin-dextromethorphan (ROBITUSSIN DM) 100-10 MG/5ML syrup Take 5 mLs by mouth every 4 (four) hours as needed for cough (chest congestion). (Patient taking differently: Take 5 mLs by mouth as needed for cough (chest congestion).)    JARDIANCE 25 MG TABS tablet Take 25 mg by mouth every evening.     Multiple Vitamin (MULTIVITAMIN WITH MINERALS) TABS tablet Take 1 tablet by mouth at bedtime.    ONETOUCH DELICA LANCETS 88K MISC     ONETOUCH VERIO test strip     OXYGEN Inhale 2 L into the lungs continuous.    pantoprazole (PROTONIX) 40 MG tablet Take 1 tablet (40 mg total) by mouth daily.    PROAIR HFA 108 (90 Base) MCG/ACT inhaler Inhale 2 puffs into the lungs every 4 (four) hours as needed for wheezing or shortness of breath.    sertraline (ZOLOFT) 50 MG tablet Take 1 tablet (50 mg total) by mouth daily.    SitaGLIPtin-MetFORMIN HCl [phone] MG TB24 Take 1 tablet by mouth daily.    sodium chloride (OCEAN) 0.65 % SOLN nasal spray Place 1 spray into both nostrils as needed for congestion.    tamsulosin (FLOMAX) 0.4 MG CAPS capsule Take 0.4 mg by mouth daily.    thiamine 100 MG tablet Take 1 tablet (100 mg total) by mouth daily. 11/30/2020: B-1   Tiotropium Bromide Monohydrate (SPIRIVA RESPIMAT) 2.5 MCG/ACT AERS Inhale 2 puffs into the lungs daily.    No facility-administered encounter medications on file as of 05/24/2021.    ALLERGIES:  Allergies  Allergen Reactions   Baclofen Other (See Comments)    lethargic    Simvastatin Nausea Only     PHYSICAL EXAM:  ECOG PERFORMANCE STATUS: 2 - Symptomatic, <50% confined to bed  There were no vitals filed for this  visit. There were no vitals filed for this visit. Physical  Exam Constitutional:      Appearance: Normal appearance.     Comments: Nasal cannula in place, on 2 L submental oxygen  HENT:     Head: Normocephalic and atraumatic.     Mouth/Throat:     Mouth: Mucous membranes are moist.  Eyes:     Extraocular Movements: Extraocular movements intact.     Pupils: Pupils are equal, round, and reactive to light.  Cardiovascular:     Rate and Rhythm: Normal rate and regular rhythm.     Pulses: Normal pulses.     Heart sounds: Normal heart sounds.  Pulmonary:     Effort: Pulmonary effort is normal.     Comments: Decreased breath sounds in posterior lung fields, poor air movement.  Faint bibasilar crackles. Abdominal:     General: Bowel sounds are normal.     Palpations: Abdomen is soft.     Tenderness: There is no abdominal tenderness.  Musculoskeletal:        General: No swelling.     Right lower leg: No edema.     Left lower leg: No edema.  Lymphadenopathy:     Cervical: No cervical adenopathy.  Skin:    General: Skin is warm and dry.  Neurological:     General: No focal deficit present.     Mental Status: He is alert and oriented to person, place, and time.  Psychiatric:        Mood and Affect: Mood normal.        Behavior: Behavior normal.     LABORATORY DATA:  I have reviewed the labs as listed.  CBC    Component Value Date/Time   WBC 5.4 02/13/2021 1302   RBC 6.29 (H) 02/13/2021 1302   HGB 16.3 02/13/2021 1302   HCT 52.0 02/13/2021 1302   PLT 82 (L) 02/13/2021 1302   MCV 82.7 02/13/2021 1302   MCH 25.9 (L) 02/13/2021 1302   MCHC 31.3 02/13/2021 1302   RDW 17.0 (H) 02/13/2021 1302   LYMPHSABS 1.3 02/13/2021 1302   MONOABS 0.4 02/13/2021 1302   EOSABS 0.2 02/13/2021 1302   BASOSABS 0.0 02/13/2021 1302   CMP Latest Ref Rng & Units 02/13/2021 12/08/2020 09/05/2020  Glucose 70 - 99 mg/dL 144(H) 243(H) 211(H)  BUN 8 - 23 mg/dL _0 Creatinine 0.61 - 1.24 mg/dL 0.75  0.75 0.58(L)  Sodium 135 - 145 mmol/L 136 136 142  Potassium 3.5 - 5.1 mmol/L 4.5 3.8 4.3  Chloride 98 - 111 mmol/L 103 103 105  CO2 22 - 32 mmol/L _1 Calcium 8.9 - 10.3 mg/dL 8.9 9.0 9.2  Total Protein 6.5 - 8.1 g/dL 8.4(H) 8.0 -  Total Bilirubin 0.3 - 1.2 mg/dL 1.2 0.8 -  Alkaline Phos 38 - 126 U/L 89 95 -  AST 15 - 41 U/L 23 20 -  ALT 0 - 44 U/L 34 27 -    DIAGNOSTIC IMAGING:  I have independently reviewed the relevant imaging and discussed with the patient.  ASSESSMENT & PLAN: 1.  Mild to moderate thrombocytopenia: - He has thrombocytopenia since 2010.  White count and hemoglobin are normal. - Normal B12 and folate.  SPEP negative for M spike. - PET scan on 08/26/2017 for follow-up lung nodule showed fatty liver with normal spleen. - Liver biopsy on 02/21/2017 showed moderate to severe steatosis. - US abdomen RUQ from 01/20/21 showed echogenic liver consistent with steatosis. No focal lesions or acute findings. Previously, imaging from 07/19/20 showed splenomegaly with  spleen measuring 15.2 cm with volume of 702.7cc.  - Blood smear previously noted normal appearing platelets.  - Differential diagnosis of of thrombocytopenia includes liver disease versus ITP very MDS - Mild bruising which is likely secondary to combination of aspirin and thrombocytopenia.  Scant epistaxis from nasal cannula.  He has not had spontaneous bleeding of gums, nose, or petechia or purpura.  . - Most recent labs (05/24/2021): Platelets 110, otherwise normal CBC.  CMP essentially unremarkable. - PLAN: Platelets remain essentially stable at baseline.  No indication for treatment of thrombocytopenia at this time. - We will consider additional treatment if platelets are persistently less than 30. - Repeat labs and RTC in 6 months.  2.  Lung nodules with history of tobacco use - Patient smoked 30 years, at least 1-1/2 packs/day.  He quit in October 2012. - CT low-dose on 02/03/2020 was lung RADS 2.   -  Patient had CT angio while hospitalized on 04/17/2020 which showed interval improvement of groundglass nodularity in the right middle lobe.   - PLAN: Patient is due for annual LDCT, which we will continue for as long as he is eligible (until October 2027).   PLAN SUMMARY & DISPOSITION: LDCT chest for lung cancer screening Labs in 6 months RTC after labs  All questions were answered. The patient knows to call the clinic with any problems, questions or concerns.  Medical decision making: Low  Time spent on visit: I spent 15 minutes counseling the patient face to face. The total time spent in the appointment was 20 minutes and more than 50% was on counseling.   Harriett Rush, PA-C  05/24/2021 11:08 AM

## 2021-05-24 ENCOUNTER — Inpatient Hospital Stay (HOSPITAL_COMMUNITY): Payer: HMO | Admitting: Physician Assistant

## 2021-05-24 ENCOUNTER — Inpatient Hospital Stay (HOSPITAL_COMMUNITY): Payer: HMO | Attending: Hematology

## 2021-05-24 ENCOUNTER — Other Ambulatory Visit: Payer: Self-pay

## 2021-05-24 VITALS — BP 113/69 | HR 79 | Temp 97.8°F | Resp 18 | Wt 161.0 lb

## 2021-05-24 DIAGNOSIS — Z7982 Long term (current) use of aspirin: Secondary | ICD-10-CM | POA: Diagnosis not present

## 2021-05-24 DIAGNOSIS — J449 Chronic obstructive pulmonary disease, unspecified: Secondary | ICD-10-CM | POA: Diagnosis not present

## 2021-05-24 DIAGNOSIS — Z7984 Long term (current) use of oral hypoglycemic drugs: Secondary | ICD-10-CM | POA: Insufficient documentation

## 2021-05-24 DIAGNOSIS — D696 Thrombocytopenia, unspecified: Secondary | ICD-10-CM

## 2021-05-24 DIAGNOSIS — Z87891 Personal history of nicotine dependence: Secondary | ICD-10-CM

## 2021-05-24 DIAGNOSIS — R918 Other nonspecific abnormal finding of lung field: Secondary | ICD-10-CM | POA: Insufficient documentation

## 2021-05-24 DIAGNOSIS — Z79899 Other long term (current) drug therapy: Secondary | ICD-10-CM | POA: Diagnosis not present

## 2021-05-24 DIAGNOSIS — Z7951 Long term (current) use of inhaled steroids: Secondary | ICD-10-CM | POA: Insufficient documentation

## 2021-05-24 LAB — CBC WITH DIFFERENTIAL/PLATELET
Abs Immature Granulocytes: 0.04 10*3/uL (ref 0.00–0.07)
Basophils Absolute: 0 10*3/uL (ref 0.0–0.1)
Basophils Relative: 1 %
Eosinophils Absolute: 0.2 10*3/uL (ref 0.0–0.5)
Eosinophils Relative: 3 %
HCT: 49.1 % (ref 39.0–52.0)
Hemoglobin: 15.5 g/dL (ref 13.0–17.0)
Immature Granulocytes: 1 %
Lymphocytes Relative: 20 %
Lymphs Abs: 1.2 10*3/uL (ref 0.7–4.0)
MCH: 26.8 pg (ref 26.0–34.0)
MCHC: 31.6 g/dL (ref 30.0–36.0)
MCV: 84.8 fL (ref 80.0–100.0)
Monocytes Absolute: 0.6 10*3/uL (ref 0.1–1.0)
Monocytes Relative: 10 %
Neutro Abs: 4.1 10*3/uL (ref 1.7–7.7)
Neutrophils Relative %: 65 %
Platelets: 110 10*3/uL — ABNORMAL LOW (ref 150–400)
RBC: 5.79 MIL/uL (ref 4.22–5.81)
RDW: 14.7 % (ref 11.5–15.5)
WBC: 6.1 10*3/uL (ref 4.0–10.5)
nRBC: 0 % (ref 0.0–0.2)

## 2021-05-24 LAB — COMPREHENSIVE METABOLIC PANEL
ALT: 28 U/L (ref 0–44)
AST: 17 U/L (ref 15–41)
Albumin: 4.1 g/dL (ref 3.5–5.0)
Alkaline Phosphatase: 95 U/L (ref 38–126)
Anion gap: 7 (ref 5–15)
BUN: 19 mg/dL (ref 8–23)
CO2: 29 mmol/L (ref 22–32)
Calcium: 8.5 mg/dL — ABNORMAL LOW (ref 8.9–10.3)
Chloride: 101 mmol/L (ref 98–111)
Creatinine, Ser: 0.8 mg/dL (ref 0.61–1.24)
GFR, Estimated: >60 mL/min (ref >60)
Glucose, Bld: 118 mg/dL — ABNORMAL HIGH (ref 70–99)
Potassium: 3.4 mmol/L — ABNORMAL LOW (ref 3.5–5.1)
Sodium: 137 mmol/L (ref 135–145)
Total Bilirubin: 0.9 mg/dL (ref 0.3–1.2)
Total Protein: 7.9 g/dL (ref 6.5–8.1)

## 2021-05-24 NOTE — Patient Instructions (Signed)
[place] at [facility] Discharge Instructions  You were seen today by Tarri Abernethy PA-C for your low platelets, which is most likely related to your fatty liver disease.  For the time being, your platelets are only mildly low and are remaining stable within the same range.  You do not need any treatment for your low platelets at this time, but you may notice that you bruise easier than you used to.  Please let us know if you have any major bleeding events such as severe nosebleeds, blood in your urine, or blood in your bowel movements.  We will schedule you for a low-dose CT scan of your chest due to your history of tobacco use (quit in 2012).  This will help [place] to detect if you have any early signs of lung cancer, and it should be repeated annually until 2027 (15 years after you quit smoking).  LABS: Return in 6 months for repeat labs  OTHER TESTS: Low-dose CT scan of the chest for lung cancer screening  MEDICATIONS: No changes to home medications  FOLLOW-UP APPOINTMENT: Office visit in 6 months, after labs   Thank you for choosing [place] at [facility] to provide your oncology and hematology care.  To afford each patient quality time with our provider, please arrive at least 15 minutes before your scheduled appointment time.   If you have a lab appointment with the [place] please come in thru the Main Entrance and check in at the main information desk.  You need to re-schedule your appointment should you arrive 10 or more minutes late.  We strive to give you quality time with our providers, and arriving late affects you and other patients whose appointments are after yours.  Also, if you no show three or more times for appointments you may be dismissed from the clinic at the providers discretion.     Again, thank you for choosing [facility].  Our hope is that these requests will decrease the amount of time that you  wait before being seen by our physicians.       _____________________________________________________________  Should you have questions after your visit to [facility], please contact our office at [phone] and follow the prompts.  Our office hours are 8:00 a.m. and 4:30 p.m. Monday - Friday.  Please note that voicemails left after 4:00 p.m. may not be returned until the following business day.  We are closed weekends and major holidays.  You do have access to a nurse 24-7, just call the main number to the clinic [phone] and do not press any options, hold on the line and a nurse will answer the phone.    For prescription refill requests, have your pharmacy contact our office and allow 72 hours.    Due to Covid, you will need to wear a mask upon entering the hospital. If you do not have a mask, a mask will be given to you at the Main Entrance upon arrival. For doctor visits, patients may have 1 support person age 23 or older with them. For treatment visits, patients can not have anyone with them due to social distancing guidelines and our immunocompromised population.

## 2021-06-02 ENCOUNTER — Telehealth: Payer: Self-pay | Admitting: Internal Medicine

## 2021-06-02 NOTE — Telephone Encounter (Signed)
Recv'd records from [facility] [place] forwarded 5 pages to Dr. Christinia Gully 11/25/22fbg

## 2021-06-06 ENCOUNTER — Ambulatory Visit: Payer: HMO | Admitting: Internal Medicine

## 2021-06-06 ENCOUNTER — Ambulatory Visit (HOSPITAL_COMMUNITY)
Admission: RE | Admit: 2021-06-06 | Discharge: 2021-06-06 | Disposition: A | Discharge status: Home / Self Care | Payer: HMO | Source: Ambulatory Visit | Attending: Internal Medicine | Admitting: Internal Medicine

## 2021-06-06 ENCOUNTER — Encounter: Payer: Self-pay | Admitting: Internal Medicine

## 2021-06-06 ENCOUNTER — Other Ambulatory Visit: Payer: Self-pay

## 2021-06-06 DIAGNOSIS — R0609 Other forms of dyspnea: Secondary | ICD-10-CM | POA: Insufficient documentation

## 2021-06-06 DIAGNOSIS — J9611 Chronic respiratory failure with hypoxia: Secondary | ICD-10-CM

## 2021-06-06 DIAGNOSIS — R0602 Shortness of breath: Secondary | ICD-10-CM | POA: Diagnosis not present

## 2021-06-06 DIAGNOSIS — J449 Chronic obstructive pulmonary disease, unspecified: Secondary | ICD-10-CM

## 2021-06-06 DIAGNOSIS — D696 Thrombocytopenia, unspecified: Secondary | ICD-10-CM

## 2021-06-06 MED ORDER — PREDNISONE 10 MG PO TABS: ORAL_TABLET | ORAL | 0 refills | Status: DC

## 2021-06-06 MED ORDER — AZITHROMYCIN 250 MG PO TABS: ORAL_TABLET | ORAL | 0 refills | Status: DC

## 2021-06-06 NOTE — Progress Notes (Signed)
[name], male    DOB: 1956/01/28     MRN: [id number]   Brief patient profile:  71 yowm MM/quit smoking 2012 with COPD GOLD III criteria 06/17/20 previously followed by Dr [name] referred back to pulmonary clinic 12/09/2019 in [place].   History of Present Illness  12/09/2019  Pulmonary/ 1st office eval/Tiena Manansala  symbicort 160/spiriva  Chief Complaint  Patient presents with   Pulmonary Consult    Former patient of Dr [name].  Breathing has been worse over the past few wks. Recently treated with Doxy per PCP for COPD exacerbation. He is occ coughing up some green sputum.   flare 11/23/19  rx doxy 5/19 x 7 days  Dyspnea:  Baseline uses HC parking / MMRC3 = can't walk 100 yards even at a slow pace at a flat grade s stopping due to sob  On 2lpm  Cough: improved less purulent purulent  Sleep: wakes up coughing since onset /sometimes uses saba proair  SABA use: rarely daytime rec Continue protonix 40 mg Take 30-60 min before first meal of the day  GERD diet  Plan A = Automatic = Always=    Budesonide/formoterol (symbicort ) 2 puffs first thing in am and 12 hours later and use spiriva x 2 pffs after the morning symbicort (ok to change back to the powder after 2 weeks but will need to stop it if you start coughing  Work on inhaler technique  Plan B = Backup (to supplement plan A, not to replace it) Only use your albuterol inhaler as a rescue medication    Plan C = Crisis (instead of Plan B   Make sure you check your oxygen saturations at highest level of activity to be sure it stays over 90%   Prednisone 10 mg take  4 each am x 2 days,   2 each am x 2 days,  1 each am x 2 days and stop  For cough > mucinex dm 1200 mg every 12 hours as needed  Would wait another 2 weeks before you get your first shot for covid 19    02/03/2020  f/u ov/Vara Mairena re: GOLD III/ 02 dep/ rx symb /spriva powder  Chief Complaint  Patient presents with   Acute Visit    shortness of breath with exertion, productive  cough with green phlegm  Dyspnea: abruptly worse x 48 h = MMRC3 = can't walk 100 yards even at a slow pace at a flat grade s stopping due to sob   Cough: worse with green mucus/ worse since changed back to dpi spiriva Sleeping: 2 am tends to cramp SABA use: none on day of ov  02: 2lpm  Up to 2.5 lpm  rec zpak Prednisone 10 mg take  4 each am x 2 days,   2 each am x 2 days,  1 each am x 2 days and stop  Change spiriva to respimat 2 puff each am  For cough mucinex dm up to 1200 mg every 12 hours and supplement hydocodone every 4 hours as needed  Get your covid vaccination asap We will call adapt for a best fit analysis for portable 0xygen  In meantime, Make sure you check your oxygen saturations at highest level of activity to be sure it stays over 90% and adjust upward to maintain this level if needed but remember to turn it back to previous settings when you stop (to conserve your supply).    04/28/2020  f/u ov/[place] office/Jenniefer Salak re: GOLD III/ 02 dep  maint rx spiriva/symbicort 160 Chief Complaint  Patient presents with   Follow-up    Pt states dx with PNA on 04/17/20- treated with zpack.  He states his breathing is slowly improving since then.  He is using his albuterol inhaler 2 x per wk and he has not used neb.  Dyspnea:  [employer] x slow on 02 3lpm x 3 ailes , not checking sats   Cough: better, no more green mucus  Sleeping: 2 pillows bed is flat  SABA use: hfa couple times a week, not neb 02: 3lpm 24/7  rec Work on inhaler technique:   Make sure you check your oxygen saturations at highest level of activity to be sure it stays over 90% and adjust  02 flow upward to maintain this level if needed but remember to turn it back to previous settings when you stop (to conserve your supply).  If your medications are not affordable on your new plan bring in your drug formulary and I will pick the best option.     05/23/2020  Acutely ill ov/[place] office/Jacquis Paxton re: new shaking chill  11/13 / and nasty color / new R cp  Chief Complaint  Patient presents with   Follow-up    productive cough with green-brownish-red phlegm since saturday, shortness of breath with activity  Dyspnea: at rest  Cough: rattling/ honking cough  Sleeping: on side 2-3 pillows  SABA use: has not tried saba  02: 3lpm Pulsed  Does not have neb equipment, not sure he can get pantoprazole or symbicort  Thru his insurance company rec To ER    [name] eval 08/01/20 rec ST eval done 08/05/20: low risk asp SLP Diet Recommendations Regular solids;Thin liquid  Liquid Administration via Straw;Cup  Medication Administration Whole meds with liquid  Compensations Slow rate;Small sips/bites  Postural Changes Seated upright at 90 degrees;Remain semi-upright after after feeds/meals (Comment)       09/15/2020  f/u ov/[place] office/Maurisha Mongeau re:  GOLD III Chief Complaint  Patient presents with   Followup     Breathing has improved since the last visit here. He states he was hospitalized recently for COPD flare. He is using his albuterol inhaler 5 x per wk on average and neb 1-2 x per wk.  Dyspnea:  150 ft and flat / can do s stopping / s 02  Cough: minimal/  Sleeping: on 3 pillows  SABA BWI:OMBT using much less  02: 3lpm hs and at rest but not with activity  Covid status: x 2 last never got 3 booster Lung cancer screening: n/a rec Plan A = Automatic = Always=    breztri Take 2 puffs first thing in am and then another 2 puffs about 12 hours later.  Work on inhaler technique: Plan B = Backup (to supplement plan A Plan C = Crisis (instead of Plan B but only if Plan B stops working) - only use your albuterol nebulizer if you first try Plan B Make sure you check your oxygen saturation  at your highest level of activity  to be sure it stays over 90% and adjust  02 flow upward to maintain this level if needed but remember to turn it back to previous settings when you stop (to conserve your supply).  6-8 in bed  blocks under the head of the bed Late add : ok to reduce bisoprolol to 5 mg one half daily if bp staying under 597 systolic    10/08/6382  f/u ov/[place] office/Miia Blanks re: GOLD III/ 02  dep maint rx spiriva/symbicort 160 Chief Complaint  Patient presents with   Follow-up    Intermittent productive cough white cloudy phlegm  Dyspnea:  Indoor shopping on 3lpm does not check sats  Cough: better  Sleeping: bed  Blocks helping  SABA use: rarely albuterol mostly p ex  02: 3lpm hs  Covid status: vax x 3  Rec Plan A = Automatic = Always=    Symbicort Take 2 puffs first thing in am and then another 2 puffs about 12 hours later and spiriva respimat 2 puffs after am symbicort  Plan B = Backup (to supplement plan A, not to replace it) Only use your albuterol inhaler as a rescue medication  Plan C = Crisis (instead of Plan B but only if Plan B stops working) - only use your albuterol nebulizer if you first try Plan B  Make sure you check your oxygen saturation  at your highest level of activity  to be sure it stays over 90% and adjust  02 flow upward to maintain this level if needed but remember to turn it back to previous settings when you stop (to conserve your supply).   Sick early November 2022 > house call with cxr by insurance rx levoquin then doxy 11/18 and pred 05/30/21 and felt better then worse again x 11/27  06/06/2021  f/u ov/[place] office/Renada Cronin re: GOLD 3/ 02 dep / maint on spiriva/ symbicort 160   Chief Complaint  Patient presents with   Follow-up    Feels breathing has worsened since last OV.  3lpm cont. At home 2.5 lpm cont. When out of house to conserve O2  Dyspnea:  sob at rest on 3lpm 24/7  Cough: slt green then clear  Sleeping: bed blocks / 3 pillows new  SABA use: 2-3 x per weeks/ no neb  02: 3lpm 24/7  Covid status: vax x 3  Lung cancer screening: scheduled for 06/28/21  Chills s sweats x 24 h Recurrent swelling R > L  Fleeting L cp cleared up p levoquin   No  obvious day to day or daytime variability or ongoing assoc excess/ purulent sputum or mucus plugs or hemoptysis or cp or chest tightness, subjective wheeze or overt sinus or hb symptoms.     Also denies any obvious fluctuation of symptoms with weather or environmental changes or other aggravating or alleviating factors except as outlined above   No unusual exposure hx or h/o childhood pna/ asthma or knowledge of premature birth.  Current Allergies, Complete Past Medical History, Past Surgical History, Family History, and Social History were reviewed in [employer] record.  ROS  The following are not active complaints unless bolded Hoarseness, sore throat, dysphagia, dental problems, itching, sneezing,  nasal congestion or discharge of excess mucus or purulent secretions, ear ache,   fever, chills, sweats, unintended wt loss or wt gain, classically pleuritic or exertional cp,  orthopnea pnd or arm/hand swelling  or leg swelling R > L , presyncope, palpitations, abdominal pain, anorexia, nausea, vomiting, diarrhea  or change in bowel habits or change in bladder habits, change in stools or change in urine, dysuria, hematuria,  rash, arthralgias, visual complaints, headache, numbness, weakness or ataxia or problems with walking or coordination,  change in mood or  memory.        Current Meds  Medication Sig   acetaminophen (TYLENOL) 500 MG tablet Take 1,000 mg by mouth as needed for headache.   albuterol (PROVENTIL) (2.5 MG/3ML) 0.083% nebulizer solution Take  3 mLs (2.5 mg total) by nebulization every 2 (two) hours as needed for wheezing or shortness of breath (cough). (Patient taking differently: Take 2.5 mg by nebulization as needed for wheezing or shortness of breath (cough).)   ALPRAZolam (XANAX) 0.25 MG tablet Take 0.25 mg by mouth 2 (two) times daily as needed for sleep.    aspirin EC 81 MG tablet Take 1 tablet (81 mg total) by mouth daily with breakfast.   atorvastatin  (LIPITOR) 40 MG tablet Take 40 mg by mouth daily.   Blood Glucose Monitoring Suppl (ONETOUCH VERIO) w/Device KIT 2 (two) times daily.   budesonide-formoterol (SYMBICORT) 160-4.5 MCG/ACT inhaler Inhale 2 puffs into the lungs 2 (two) times daily.   Cholecalciferol (VITAMIN D3) 25 MCG (1000 UT) CAPS Take 1 capsule by mouth daily.   [employer] VITAMIN C 250 MG tablet Take 250 mg by mouth daily.   diltiazem (CARDIZEM CD) 240 MG 24 hr capsule Take 240 mg by mouth daily.   gabapentin (NEURONTIN) 300 MG capsule Take 1 capsule (300 mg total) by mouth at bedtime.   glimepiride (AMARYL) 1 MG tablet Take 1 mg by mouth daily with breakfast.   guaiFENesin (MUCINEX) 600 MG 12 hr tablet Take 1 tablet (600 mg total) by mouth 2 (two) times daily. (Patient taking differently: Take 600 mg by mouth as needed.)   guaiFENesin-dextromethorphan (ROBITUSSIN DM) 100-10 MG/5ML syrup Take 5 mLs by mouth every 4 (four) hours as needed for cough (chest congestion). (Patient taking differently: Take 5 mLs by mouth as needed for cough (chest congestion).)   JARDIANCE 25 MG TABS tablet Take 25 mg by mouth every evening.    Multiple Vitamin (MULTIVITAMIN WITH MINERALS) TABS tablet Take 1 tablet by mouth at bedtime.   ONETOUCH DELICA LANCETS 00T MISC    ONETOUCH VERIO test strip    OXYGEN Inhale 2 L into the lungs continuous.   pantoprazole (PROTONIX) 40 MG tablet Take 1 tablet (40 mg total) by mouth daily.   PROAIR HFA 108 (90 Base) MCG/ACT inhaler Inhale 2 puffs into the lungs every 4 (four) hours as needed for wheezing or shortness of breath.   sertraline (ZOLOFT) 50 MG tablet Take 1 tablet (50 mg total) by mouth daily.   SitaGLIPtin-MetFORMIN HCl 3162439317 MG TB24 Take 1 tablet by mouth daily.   sodium chloride (OCEAN) 0.65 % SOLN nasal spray Place 1 spray into both nostrils as needed for congestion.   tamsulosin (FLOMAX) 0.4 MG CAPS capsule Take 0.4 mg by mouth daily.   thiamine 100 MG tablet Take 1 tablet (100 mg total) by mouth  daily.   Tiotropium Bromide Monohydrate (SPIRIVA RESPIMAT) 2.5 MCG/ACT AERS Inhale 2 puffs into the lungs daily.                 Past Medical History:  Diagnosis Date   Anxiety    Asthma    Chest pain    a. 2003 Cath: nl cors.   COPD (chronic obstructive pulmonary disease) ([place])    Depression    Diabetes mellitus    Dyslipidemia    Dysrhythmia    Fracture 12/15   lower back "L-1"   GERD (gastroesophageal reflux disease)    Hypertension    PONV (postoperative nausea and vomiting)    Splenomegaly        Objective:    06/06/2021      162 12/08/2020          163 09/15/2020        154  05/23/2020      166 04/28/2020      166   12/30/19 162 lb (73.5 kg)  12/21/19 162 lb (73.5 kg)  12/09/19 162 lb (73.5 kg)   Vital signs reviewed  06/06/2021  - Note at rest 02 sats  92% on 2.5 pm    General appearance:    amb wm more chronically than acutely ill at present though mild increased wob at rest   HEENT : pt wearing mask not removed for exam due to covid - 19 concerns.    NECK :  without JVD/Nodes/TM/ nl carotid upstrokes bilaterally   LUNGS: no acc muscle use,  Mild barrel  contour chest wall with bilateral  Distant isnp/exp rhonchi  and  without cough on insp or exp maneuvers  and mild  Hyperresonant  to  percussion bilaterally     CV:  RRR  no s3 or murmur or increase in P2, and  R > L pedal edema ABD:  mod distended but soft and nontender with pos end  insp Hoover's  in the supine position. No bruits or organomegaly appreciated, bowel sounds nl  MS:   Nl gait/  ext warm without deformities, calf tenderness, cyanosis or clubbing No obvious joint restrictions   SKIN: warm and dry without lesions    NEURO:  alert, approp, nl sensorium with  no motor or cerebellar deficits apparent.         CXR PA and Lateral:   06/06/2021 :    I personally reviewed images and agree with radiology impression as follows:     Mild bilateral midlung opacities are noted concerning  for possible pneumonia. My impression:  improved over priors   Labs ordered/ reviewed:      Chemistry      Component Value Date/Time   NA 139 06/06/2021 0900   K 4.2 06/06/2021 0900   CL 97 06/06/2021 0900   CO2 27 06/06/2021 0900   BUN 11 06/06/2021 0900   CREATININE 0.92 06/06/2021 0900   CREATININE 0.86 04/24/2013 0756      Component Value Date/Time   CALCIUM 9.7 06/06/2021 0900   ALKPHOS 140 (H) 06/06/2021 0900   ALKPHOS 41 06/22/2011 0000   AST 25 06/06/2021 0900   AST 22 06/22/2011 0000   ALT 28 06/06/2021 0900   BILITOT 1.4 (H) 06/06/2021 0900   BILITOT 0.5 06/22/2011 0000        Lab Results  Component Value Date   WBC 5.5 06/06/2021   HGB 16.5 06/06/2021   HCT 50.8 06/06/2021   MCV 81 06/06/2021   PLT 92 (LL) 06/06/2021       EOS                                                              0.2                                    06/06/2021   Lab Results  Component Value Date   DDIMER 0.46 06/06/2021      Lab Results  Component Value Date   TSH 3.030 06/06/2021     BNP    06/06/2021   = 8  Lab Results  Component Value Date   ESRSEDRATE 11 06/06/2021   ESRSEDRATE 50 (H) 10/29/2017   ESRSEDRATE 5 10/25/2017           Assessment

## 2021-06-06 NOTE — Patient Instructions (Addendum)
Plan A = Automatic = Always=    Symbicort Take 2 puffs first thing in am and then another 2 puffs about 12 hours later and spiriva respimat 2 puffs after am symbicort   Plan B = Backup (to supplement plan A, not to replace it) Only use your albuterol inhaler as a rescue medication to be used if you can't catch your breath by resting or doing a relaxed purse lip breathing pattern.  - The less you use it, the better it will work when you need it. - Ok to use the inhaler up to 2 puffs  every 4 hours if you must but call for appointment if use goes up over your usual need - Don't leave home without it !!  (think of it like the spare tire for your car)   Plan C = Crisis (instead of Plan B but only if Plan B stops working) - only use your albuterol nebulizer if you first try Plan B and it fails to help > ok to use the nebulizer up to every 4 hours but if start needing it regularly call for immediate appointment   Make sure you check your oxygen saturation  at your highest level of activity  to be sure it stays over 90% and adjust  02 flow upward to maintain this level if needed but remember to turn it back to previous settings when you stop (to conserve your supply).    Zpak   Prednisone 10 mg take  4 each am x 2 days,   2 each am x 2 days,  1 each am x 2 days and stop   Please remember to go to the lab department   for your tests - we will call you with the results when they are available.      Please remember to go to the  x-ray department  @  [facility] for your tests - we will call you with the results when they are available     If condition worsens go directly to ER   Please schedule a follow up visit in 3 months but call sooner if needed

## 2021-06-07 ENCOUNTER — Telehealth: Payer: Self-pay | Admitting: Internal Medicine

## 2021-06-07 DIAGNOSIS — J9611 Chronic respiratory failure with hypoxia: Secondary | ICD-10-CM | POA: Diagnosis not present

## 2021-06-07 DIAGNOSIS — N400 Enlarged prostate without lower urinary tract symptoms: Secondary | ICD-10-CM | POA: Diagnosis not present

## 2021-06-07 NOTE — Telephone Encounter (Signed)
Recv'd records from [place] [facility] forwarded 4 pages to Dr. Christinia Gully 11/30/22fbg

## 2021-06-08 DIAGNOSIS — E1140 Type 2 diabetes mellitus with diabetic neuropathy, unspecified: Secondary | ICD-10-CM | POA: Diagnosis not present

## 2021-06-09 ENCOUNTER — Encounter: Payer: Self-pay | Admitting: Internal Medicine

## 2021-06-09 LAB — CBC WITH DIFFERENTIAL/PLATELET
Basophils Absolute: 0 10*3/uL (ref 0.0–0.2)
Basos: 1 %
EOS (ABSOLUTE): 0.2 10*3/uL (ref 0.0–0.4)
Eos: 3 %
Hematocrit: 50.8 % (ref 37.5–51.0)
Hemoglobin: 16.5 g/dL (ref 13.0–17.7)
Immature Grans (Abs): 0 10*3/uL (ref 0.0–0.1)
Immature Granulocytes: 1 %
Lymphocytes Absolute: 0.8 10*3/uL (ref 0.7–3.1)
Lymphs: 14 %
MCH: 26.2 pg — ABNORMAL LOW (ref 26.6–33.0)
MCHC: 32.5 g/dL (ref 31.5–35.7)
MCV: 81 fL (ref 79–97)
Monocytes Absolute: 0.5 10*3/uL (ref 0.1–0.9)
Monocytes: 8 %
Neutrophils Absolute: 4 10*3/uL (ref 1.4–7.0)
Neutrophils: 73 %
Platelets: 92 10*3/uL — CL (ref 150–450)
RBC: 6.3 x10E6/uL — ABNORMAL HIGH (ref 4.14–5.80)
RDW: 14.5 % (ref 11.6–15.4)
WBC: 5.5 10*3/uL (ref 3.4–10.8)

## 2021-06-09 LAB — BASIC METABOLIC PANEL
BUN/Creatinine Ratio: 12 (ref 10–24)
BUN: 11 mg/dL (ref 8–27)
CO2: 27 mmol/L (ref 20–29)
Calcium: 9.7 mg/dL (ref 8.6–10.2)
Chloride: 97 mmol/L (ref 96–106)
Creatinine, Ser: 0.92 mg/dL (ref 0.76–1.27)
Glucose: 134 mg/dL — ABNORMAL HIGH (ref 70–99)
Potassium: 4.2 mmol/L (ref 3.5–5.2)
Sodium: 139 mmol/L (ref 134–144)
eGFR: 92 mL/min/{1.73_m2} (ref >59)

## 2021-06-09 LAB — HEPATIC FUNCTION PANEL
ALT: 28 IU/L (ref 0–44)
AST: 25 IU/L (ref 0–40)
Albumin: 4.6 g/dL (ref 3.8–4.8)
Alkaline Phosphatase: 140 IU/L — ABNORMAL HIGH (ref 44–121)
Bilirubin Total: 1.4 mg/dL — ABNORMAL HIGH (ref 0.0–1.2)
Bilirubin, Direct: 0.32 mg/dL (ref 0.00–0.40)
Total Protein: 7.8 g/dL (ref 6.0–8.5)

## 2021-06-09 LAB — BRAIN NATRIURETIC PEPTIDE: BNP: 7.8 pg/mL (ref 0.0–100.0)

## 2021-06-09 LAB — D-DIMER, QUANTITATIVE: D-DIMER: 0.46 mg/L FEU (ref 0.00–0.49)

## 2021-06-09 LAB — TSH: TSH: 3.03 u[IU]/mL (ref 0.450–4.500)

## 2021-06-09 LAB — SEDIMENTATION RATE: Sed Rate: 11 mm/hr (ref 0–30)

## 2021-06-09 NOTE — Assessment & Plan Note (Signed)
Quit smoking 2012 - alpha one AT screen  01/31/17  MM  Level 169 - PFT's  06/18/2019  FEV1 1.12 (35 % ) ratio 0.46  p 0 % improvement from saba p ? prior to study with DLCO  11.91 (48%) corrects to 3.18 (754%)  for alv volume and FV curve classic concavity   - 12/09/2019  After extensive coaching inhaler device,  effectiveness =    90% with smi so try spiriva respimat 2 bid instead of DPI  - 09/15/2020  After extensive coaching inhaler device,  effectiveness =    75% > breztri trial > preferreed sym/ spiriva smi - 12/08/2020  After extensive coaching inhaler device,  effectiveness = 80%        Group D in terms of symptom/risk and laba/lama/ICS  therefore appropriate rx at this point >>>  Continue smbicort/spiriva      Flare of copd superimposed on severe copd - doubt acute pna and in fact cxr improed vs priors s evidence chf/ PE so rx with zpak/ pred and ABC plan -  see avs for instructions unique to this ov

## 2021-06-09 NOTE — Assessment & Plan Note (Signed)
Lab Results  Component Value Date   PLT 92 (LL) 06/06/2021   PLT 110 (L) 05/24/2021   PLT 82 (L) 02/13/2021   PLT 101 (L) 12/08/2020     Secondary to hypersplenism/ f/u planned

## 2021-06-09 NOTE — Assessment & Plan Note (Signed)
Started on 02 around 2019 As of 02/03/2020  2lpm 24/7  -  02/03/2020 referred to adapt for Best fit for amb 02 > 3lpm pulsed tank -  04/28/2020   Walked 3lpm pulsed tank  approx   400 ft  @ moderate pace  stopped due to  Sob/tired  with sats still 95%    -  09/15/2020   Walked 3lpm cont approx   200 ft  @ slow/slt undsteady  pace  stopped due to  Sob/fatigue   With sats 91% at rest sats were 86% RA   12/08/2020   Walked RA  approx   500 ft  @ moderate pace  stopped due to  End of study, a little sob, sats still 90%   rec as of 12/08/2020   =  3lpm hs but daytime titrate to sats > 90%    Each maintenance medication was reviewed in detail including emphasizing most importantly the difference between maintenance and prns and under what circumstances the prns are to be triggered using an action plan format where appropriate.  Total time for H and P, chart review, counseling, reviewing hfa and smi device(s) , directly observing portions of ambulatory 02 saturation study/ and generating customized AVS unique to this office visit / same day charting  > 30 min

## 2021-06-09 NOTE — Assessment & Plan Note (Signed)
Started on 02 around 2019 As of 02/03/2020  2lpm 24/7  -  02/03/2020 referred to [employer] for Best fit for amb 02 > 3lpm pulsed tank -  04/28/2020   Walked 3lpm pulsed tank  approx   400 ft  @ moderate pace  stopped due to  Sob/tired  with sats still 95%    -  09/15/2020   Walked 3lpm cont approx   200 ft  @ slow/slt undsteady  pace  stopped due to  Sob/fatigue   With sats 91% at rest sats were 86% RA   12/08/2020   Walked RA  approx   500 ft  @ moderate pace  stopped due to  End of study, a little sob, sats still 90%   Adequate control on present rx, reviewed in detail with pt > no change in rx needed  = 3lpm but ok to titrate to keep sats > 90%          Each maintenance medication was reviewed in detail including emphasizing most importantly the difference between maintenance and prns and under what circumstances the prns are to be triggered using an action plan format where appropriate.  Total time for H and P, chart review, counseling, reviewing hfa/smi /02 device(s) and generating customized AVS unique to this office visit / same day charting  > 30 min

## 2021-06-15 DIAGNOSIS — E1140 Type 2 diabetes mellitus with diabetic neuropathy, unspecified: Secondary | ICD-10-CM | POA: Diagnosis not present

## 2021-06-15 DIAGNOSIS — Z23 Encounter for immunization: Secondary | ICD-10-CM | POA: Diagnosis not present

## 2021-06-15 DIAGNOSIS — I10 Essential (primary) hypertension: Secondary | ICD-10-CM | POA: Diagnosis not present

## 2021-06-15 DIAGNOSIS — R7309 Other abnormal glucose: Secondary | ICD-10-CM | POA: Diagnosis not present

## 2021-06-15 DIAGNOSIS — D696 Thrombocytopenia, unspecified: Secondary | ICD-10-CM | POA: Diagnosis not present

## 2021-06-28 ENCOUNTER — Ambulatory Visit (HOSPITAL_COMMUNITY)
Admission: RE | Admit: 2021-06-28 | Discharge: 2021-06-28 | Disposition: A | Discharge status: Home / Self Care | Payer: HMO | Source: Ambulatory Visit | Attending: Physician Assistant | Admitting: Physician Assistant

## 2021-06-28 ENCOUNTER — Other Ambulatory Visit: Payer: Self-pay

## 2021-06-28 DIAGNOSIS — Z87891 Personal history of nicotine dependence: Secondary | ICD-10-CM | POA: Diagnosis not present

## 2021-06-30 DIAGNOSIS — J189 Pneumonia, unspecified organism: Secondary | ICD-10-CM | POA: Diagnosis not present

## 2021-07-19 NOTE — H&P (View-Only) (Signed)
Referring Provider: Asencion Noble, MD Primary Care Physician:  Asencion Noble, MD Primary GI Physician: Dr. [name]  Chief Complaint  Patient presents with   Hemorrhoids    Occ bleeding when wipes    NASH   Gastroesophageal Reflux    ok    HPI:   [name] is a 66 y.o. male with history significant for GERD, biopsy-proven NASH fibrosis, elastography F2/F3, chronic thrombocytopenia following with hematology, splenomegaly, portal gastropathy, currently being followed for routine cirrhosis care.  MELD 6 in June 2022. Recently underwent EGD in June 2022 with normal esophagus s/p dilation, portal hypertensive gastropathy.  Due for repeat in 2024. Colonoscopy in 2018, due for surveillance at this time. He is presenting today for routine follow-up. We also received a referral from Dr. Willey Blade for hemorrhoids.   Last seen in our office 01/12/2021.  GERD was well controlled on Protonix 40 mg daily.  No dysphagia.  Reported prior mild swelling in his lower extremities and abdomen had improved with cooking at home rather than eating premade foods and hot dogs.  No HE or jaundice.  Admitted to easy bruising.  He had received 2 hep A/B vaccinations with last dose due in September.  He was scheduled for routine ultrasound, advised to continue low-sodium diet and complete hep A/B vaccine series, plan to arrange routine cirrhosis labs in December, continue Protonix daily, follow-up in 6 months and discuss scheduling colonoscopy at that time.  RUQ ultrasound consistent with steatosis, no focal liver lesions.  Labs were not completed.  Today: Reports he is having intermittent hemorrhoid flares a couple times a month.  Hemorrhoids are prolapsed most all the time, but intermittently become more swollen with associated toilet tissue hematochezia with bowel movements.  This occurs a couple times a month.  Denies any bright red blood in the stool or in toilet water.  Sits about 5 minutes or less to have a bowel  movement.  Stools are soft.  Patient showed me a picture of prolapsed hemorrhoids on his phone today.  Denies rectal pain.  GERD:  Well controlled on Protonix 40 mg daily.  Denies dysphagia, nausea, vomiting, abdominal pain.  NASH: Denies abdominal swelling/increasing distention.  He has noticed swelling in his right lower extremity for the last 3 or more weeks with no swelling in his left lower extremity.  Denies any associated pain, redness, or recent injury to the area.  Continues to bruise easily and has had some intermittent nosebleeds.  Has plans to follow-up with ear nose and throat in March as he feels there is something wrong with his nose.  He is having some pain.  Aside from toilet tissue hematochezia, no other rectal bleeding or melena.  Completed Hep A/B vaccination.    Had pneumonia in December, but doing much better.  Feels well on 3 L supplemental O2 via nasal cannula.  No significant cough.  No chest pain.     Past Medical History:  Diagnosis Date   Anxiety    Asthma    Chest pain    a. 2003 Cath: nl cors.   COPD (chronic obstructive pulmonary disease) ([place])    chronically on 3-4L [place]   Depression    Diabetes mellitus    Dyslipidemia    Dysrhythmia    Fracture 06/2014   lower back "L-1"   GERD (gastroesophageal reflux disease)    Hypertension    NASH (nonalcoholic steatohepatitis) 2018   [name], undergoing routine cirrhosis care; completed Hep A/B  vaccine series September 2022   PONV (postoperative nausea and vomiting)    Splenomegaly    Thrombocytopenia ([place])     Past Surgical History:  Procedure Laterality Date   BIOPSY  07/26/2016   Procedure: BIOPSY;  Surgeon: [name], MD;  Location: [facility];  Service: Endoscopy;;  gastric   CARDIAC CATHETERIZATION  10/22/2001   normal coronary arteriea (Dr. Domenic Moras)   COLONOSCOPY  08/31/2011   [id number] rectal and colon polyps-treated. Single tubular adenoma. Next TCS 08/2016   COLONOSCOPY N/A  07/26/2016   Dr. [name]: diverticulosis in sigmoid and descending colon. 6 mm benign splenic flexure. surveillance 5 yeras   ESOPHAGOGASTRODUODENOSCOPY N/A 07/26/2016   Dr. [name]: normal esophagus, portal gastropathy, chronic gastritis, normal duodenum, screening EGD 2 years    ESOPHAGOGASTRODUODENOSCOPY (EGD) WITH ESOPHAGEAL DILATION N/A 05/15/2013   [name] esophagus-status post Venia Minks dilation/Portal gastropathy. Antral erosions-status post biopsy. extrinsic compression along lesser curvature likely secondary to splenomegaly. gastric bx benign   ESOPHAGOGASTRODUODENOSCOPY (EGD) WITH PROPOFOL N/A 12/12/2020   Surgeon: [name], MD; ormal esophagus s/p dilation, portal hypertensive gastropathy.  Due for repeat in 2024.   HERNIA REPAIR  03/2009   MALONEY DILATION N/A 12/12/2020   Procedure: Venia Minks DILATION;  Surgeon: [name], MD;  Location: [facility];  Service: Endoscopy;  Laterality: N/A;   POLYPECTOMY  07/26/2016   Procedure: POLYPECTOMY;  Surgeon: [name], MD;  Location: [facility];  Service: Endoscopy;;  colon   TRANSTHORACIC ECHOCARDIOGRAM  2011   EF 43-32%, stage 1 diastolic dysfunction, trace TR & pulm valve regurg    VASECTOMY  1989    Current Outpatient Medications  Medication Sig Dispense Refill   acetaminophen (TYLENOL) 500 MG tablet Take 1,000 mg by mouth as needed for headache.     albuterol (PROVENTIL) (2.5 MG/3ML) 0.083% nebulizer solution Take 3 mLs (2.5 mg total) by nebulization every 2 (two) hours as needed for wheezing or shortness of breath (cough). (Patient taking differently: Take 2.5 mg by nebulization as needed for wheezing or shortness of breath (cough).) 75 mL 1   ALPRAZolam (XANAX) 0.25 MG tablet Take 0.25 mg by mouth 2 (two) times daily as needed for sleep.      aspirin EC 81 MG tablet Take 1 tablet (81 mg total) by mouth daily with breakfast. 30 tablet 5   atorvastatin (LIPITOR) 40 MG tablet Take 40 mg by mouth daily.     Blood  Glucose Monitoring Suppl (ONETOUCH VERIO) w/Device KIT 2 (two) times daily.     budesonide-formoterol (SYMBICORT) 160-4.5 MCG/ACT inhaler Inhale 2 puffs into the lungs 2 (two) times daily.     Cholecalciferol (VITAMIN D3) 25 MCG (1000 UT) CAPS Take 1 capsule by mouth daily.     [employer] VITAMIN C 250 MG tablet Take 250 mg by mouth daily.     diltiazem (CARDIZEM CD) 240 MG 24 hr capsule Take 240 mg by mouth daily.     gabapentin (NEURONTIN) 300 MG capsule Take 1 capsule (300 mg total) by mouth at bedtime. 30 capsule 5   glimepiride (AMARYL) 1 MG tablet Take 1 mg by mouth daily with breakfast.     guaiFENesin (MUCINEX) 600 MG 12 hr tablet Take 1 tablet (600 mg total) by mouth 2 (two) times daily. (Patient taking differently: Take 600 mg by mouth as needed.) 20 tablet 0   guaiFENesin-dextromethorphan (ROBITUSSIN DM) 100-10 MG/5ML syrup Take 5 mLs by mouth every 4 (four) hours as needed for cough (chest congestion). (  Patient taking differently: Take 5 mLs by mouth as needed for cough (chest congestion).) 118 mL 0   JARDIANCE 25 MG TABS tablet Take 25 mg by mouth every evening.   3   metFORMIN (GLUCOPHAGE) 1000 MG tablet Take 1,000 mg by mouth daily. Takes with evening meal.     Multiple Vitamin (MULTIVITAMIN WITH MINERALS) TABS tablet Take 1 tablet by mouth at bedtime. 30 tablet 2   ONETOUCH DELICA LANCETS 83M MISC      ONETOUCH VERIO test strip      OXYGEN Inhale 3 L into the lungs continuous.     pantoprazole (PROTONIX) 40 MG tablet Take 1 tablet (40 mg total) by mouth daily. 90 tablet 3   PROAIR HFA 108 (90 Base) MCG/ACT inhaler Inhale 2 puffs into the lungs every 4 (four) hours as needed for wheezing or shortness of breath. 18 g 12   sertraline (ZOLOFT) 50 MG tablet Take 1 tablet (50 mg total) by mouth daily. 30 tablet 3   SitaGLIPtin-MetFORMIN HCl [id number] MG TB24 Take 1 tablet by mouth daily.     sodium chloride (OCEAN) 0.65 % SOLN nasal spray Place 1 spray into both nostrils as needed for  congestion. 60 mL 0   tamsulosin (FLOMAX) 0.4 MG CAPS capsule Take 0.4 mg by mouth daily.     thiamine 100 MG tablet Take 1 tablet (100 mg total) by mouth daily. 30 tablet 2   Tiotropium Bromide Monohydrate (SPIRIVA RESPIMAT) 2.5 MCG/ACT AERS Inhale 2 puffs into the lungs daily.     polyethylene glycol-electrolytes (NULYTELY) 420 g solution As directed 4000 mL 0   No current facility-administered medications for this visit.    Allergies as of 07/20/2021 - Review Complete 07/20/2021  Allergen Reaction Noted   Baclofen Other (See Comments) 07/24/2016   Simvastatin Nausea Only 10/05/2013    Family History  Problem Relation Age of Onset   Heart disease Mother    Cancer Mother        lymph nodes   Diabetes Mother    Arthritis Other    Lung disease Other    Asthma Other    Kidney disease Other    Ovarian cancer Sister    Diabetes Brother    Heart disease Brother    Hypertension Brother    Diabetes Sister    Diabetes Sister    Cirrhosis Sister 75       no etoh   Colon cancer Neg Hx     Social History   Socioeconomic History   Marital status: Married    Spouse name: Not on file   Number of children: Not on file   Years of education: Not on file   Highest education level: Not on file  Occupational History   Not on file  Tobacco Use   Smoking status: Former    Packs/day: 1.25    Years: 36.00    Pack years: 45.00    Types: Cigarettes    Quit date: 04/2011    Years since quitting: 10.2   Smokeless tobacco: Former  Scientific laboratory technician Use: Never used  Substance and Sexual Activity   Alcohol use: No   Drug use: No   Sexual activity: Not on file  Other Topics Concern   Not on file  Social History Narrative   Not on file   Social Determinants of Health   Financial Resource Strain: Low Risk    Difficulty of Paying Living Expenses: Not hard at all  Food Insecurity:  No Food Insecurity   Worried About Charity fundraiser in the Last Year: Never true   Ran Out of  Food in the Last Year: Never true  Transportation Needs: No Transportation Needs   Lack of Transportation (Medical): No   Lack of Transportation (Non-Medical): No  Physical Activity: Sufficiently Active   Days of Exercise per Week: 7 days   Minutes of Exercise per Session: 30 min  Stress: No Stress Concern Present   Feeling of Stress : Not at all  Social Connections: Moderately Integrated   Frequency of Communication with Friends and Family: Three times a week   Frequency of Social Gatherings with Friends and Family: Three times a week   Attends Religious Services: More than 4 times per year   Active Member of Clubs or Organizations: No   Attends Archivist Meetings: Never   Marital Status: Married    Review of Systems: Gen: Denies fever, chills, cold or flulike symptoms, presyncope, syncope. CV: Denies chest pain, palpitations Resp: See HPI GI: See HPI Heme: See HPI  Physical Exam: BP 108/65    Pulse 98    Temp (!) 97.1 F (36.2 C) (Temporal)    Ht _0  (1.702 m)    Wt 163 lb 6.4 oz (74.1 kg)    BMI 25.59 kg/m  General:   Alert and oriented. No distress noted. Pleasant and cooperative.  Head:  Normocephalic and atraumatic. Eyes:  Conjuctiva clear without scleral icterus. Mouth:  Oral mucosa pink and moist. Good dentition. No lesions. Heart:  S1, S2 present without murmurs appreciated. Lungs:  Clear to auscultation bilaterally. No wheezes, rales, or rhonchi. No distress.  Nasal cannula in place on 3 L. Abdomen:  +BS.abdomen is protuberant, but remains soft.  Mild TTP in RUQ.  No rebound or guarding. No HSM or masses noted. Msk:  Symmetrical without gross deformities. Normal posture. Extremities:  With 1+ pitting edema in RLE from foot to 3/4 the way up his shin. No edema of LLE. Neurologic:  Alert and  oriented x4 Psych:  Normal mood and affect.    Assessment: 66 year old male with history of COPD with chronic respiratory failure on supplemental oxygen (3L), type  2 diabetes, HTN, GERD, biopsy-proven [place] fibrosis, elastography F2/F3, chronic thrombocytopenia following with hematology, splenomegaly, portal gastropathy, and currently being followed for routine cirrhosis care.  EGD in June 2022 with normal esophagus s/p dilation, portal hypertensive gastropathy, due for repeat in 2024.  Colonoscopy in January 2018, currently due for surveillance.  He is presenting today for routine follow-up with chief complaint of hemorrhoids with intermittent rectal bleeding.  Rectal bleeding: Intermittent toilet tissue hematochezia couple times a month in the setting of hemorrhoids that have become prolapsed, currently using preparation H as needed with good response.  No rectal exam performed today, but patient does show me a picture of prolapsed hemorrhoids on his phone.  Denies rectal pain.  Blood is bright red and only on toilet tissue.  No blood in toilet water or mixed in his stool.  No abdominal pain or unintentional weight loss.   Toilet tissue hematochezia likely secondary to hemorrhoids, but cannot rule out other colonic sources as he does have history of polyps and is currently due for surveillance colonoscopy.  We will treat hemorrhoids symptomatically and arrange colonoscopy in the near future. Would not be a candidate for hemorrhoid banding in the setting of cirrhosis.   History of adenomatous colon polyps: Due for surveillance colonoscopy.  Last colonoscopy in January  2018 with single 6 mm benign polyp removed, recommended surveillance in 5 years.   [name] Fibrosis/Early Cirrhosis: Biopsy-proven NASH fibrosis in 2018.  Has remained fairly well compensated to this point.  He does continue to bruise easily and has had some epistaxis intermittently in the setting of chronic thrombocytopenia.  More recently with intermittent toilet tissue hematochezia likely secondary to hemorrhoids as per above.  No melena.  No other signs or symptoms of decompensated disease.  MELD 6 on  labs in June 2022. EGD up-to-date in June 2022 with no esophageal varices but evidence of portal hypertensive gastropathy.   He has completed hepatitis A/B vaccine series as of September 2022. Currently due for routine RUQ ultrasound and labs.   GERD:  Well-controlled on Protonix 40 mg daily.  No alarm symptoms.  Right lower extremity edema: Unilateral 1+ pitting edema of right lower extremity extending from foot to 3/4 the way up his shin.  Per patient, this has been present for about 3 weeks.  No associated pain or erythema.  No recent injury.  No edema of left lower extremity.  We will plan for venous ultrasound to rule out DVT.   Plan:  CBC, CMP, INR, AFP RUQ ultrasound. Venous ultrasound right lower extremity. Colonoscopy with propofol with Dr. Abbey Chatters (in the absence of Dr. [name]) in the near future. Patient is agreeable for Dr. Abbey Chatters to perform his procedure. The risks, benefits, and alternatives have been discussed with the patient in detail. The patient states understanding and desires to proceed. ASA 3/4 See separate instructions for diabetes medication adjustments. Use Preparation H twice daily x7-10 days for hemorrhoid flares. Start Benefiber 2 teaspoons daily x2 weeks then increase to twice daily. Limit toilet time to 2-3 minutes. Avoid straining. Continue Protonix 40 mg daily. Nutrition Recommendations High-protein diet from a primarily plant-based diet. Avoid red meat.  No raw or undercooked meat, seafood, or shellfish. Low-fat/cholesterol/carbohydrate diet. Limit sodium to no more than 2000 mg/day including everything that you eat and drink. Continue to be as active as you are able.  Follow-up in 6 months or sooner if needed.    Aliene Altes, PA-C [facility] 07/20/2021

## 2021-07-19 NOTE — Progress Notes (Signed)
Referring Provider: Asencion Noble, MD Primary Care Physician:  Asencion Noble, MD Primary GI Physician: Dr. [name]  Chief Complaint  Patient presents with   Hemorrhoids    Occ bleeding when wipes    NASH   Gastroesophageal Reflux    ok    HPI:   [name] is a 66 y.o. male with history significant for GERD, biopsy-proven NASH fibrosis, elastography F2/F3, chronic thrombocytopenia following with hematology, splenomegaly, portal gastropathy, currently being followed for routine cirrhosis care.  MELD 6 in June 2022. Recently underwent EGD in June 2022 with normal esophagus s/p dilation, portal hypertensive gastropathy.  Due for repeat in 2024. Colonoscopy in 2018, due for surveillance at this time. He is presenting today for routine follow-up. We also received a referral from Dr. Willey Blade for hemorrhoids.   Last seen in our office 01/12/2021.  GERD was well controlled on Protonix 40 mg daily.  No dysphagia.  Reported prior mild swelling in his lower extremities and abdomen had improved with cooking at home rather than eating premade foods and hot dogs.  No HE or jaundice.  Admitted to easy bruising.  He had received 2 hep A/B vaccinations with last dose due in September.  He was scheduled for routine ultrasound, advised to continue low-sodium diet and complete hep A/B vaccine series, plan to arrange routine cirrhosis labs in December, continue Protonix daily, follow-up in 6 months and discuss scheduling colonoscopy at that time.  RUQ ultrasound consistent with steatosis, no focal liver lesions.  Labs were not completed.  Today: Reports he is having intermittent hemorrhoid flares a couple times a month.  Hemorrhoids are prolapsed most all the time, but intermittently become more swollen with associated toilet tissue hematochezia with bowel movements.  This occurs a couple times a month.  Denies any bright red blood in the stool or in toilet water.  Sits about 5 minutes or less to have a bowel  movement.  Stools are soft.  Patient showed me a picture of prolapsed hemorrhoids on his phone today.  Denies rectal pain.  GERD:  Well controlled on Protonix 40 mg daily.  Denies dysphagia, nausea, vomiting, abdominal pain.  NASH: Denies abdominal swelling/increasing distention.  He has noticed swelling in his right lower extremity for the last 3 or more weeks with no swelling in his left lower extremity.  Denies any associated pain, redness, or recent injury to the area.  Continues to bruise easily and has had some intermittent nosebleeds.  Has plans to follow-up with ear nose and throat in March as he feels there is something wrong with his nose.  He is having some pain.  Aside from toilet tissue hematochezia, no other rectal bleeding or melena.  Completed Hep A/B vaccination.    Had pneumonia in December, but doing much better.  Feels well on 3 L supplemental O2 via nasal cannula.  No significant cough.  No chest pain.     Past Medical History:  Diagnosis Date   Anxiety    Asthma    Chest pain    a. 2003 Cath: nl cors.   COPD (chronic obstructive pulmonary disease) ([place])    chronically on 3-4L [place]   Depression    Diabetes mellitus    Dyslipidemia    Dysrhythmia    Fracture 06/2014   lower back "L-1"   GERD (gastroesophageal reflux disease)    Hypertension    NASH (nonalcoholic steatohepatitis) 2018   [name], undergoing routine cirrhosis care; completed Hep A/B  vaccine series September 2022   PONV (postoperative nausea and vomiting)    Splenomegaly    Thrombocytopenia ([place])     Past Surgical History:  Procedure Laterality Date   BIOPSY  07/26/2016   Procedure: BIOPSY;  Surgeon: [name], MD;  [facility]: [facility];  Service: Endoscopy;;  gastric   CARDIAC CATHETERIZATION  10/22/2001   normal coronary arteriea (Dr. Domenic Moras)   COLONOSCOPY  08/31/2011   AQT:[id number] rectal and colon polyps-treated. Single tubular adenoma. Next TCS 08/2016   COLONOSCOPY N/A  07/26/2016   Dr. [name]: diverticulosis in sigmoid and descending colon. 6 mm benign splenic flexure. surveillance 5 yeras   ESOPHAGOGASTRODUODENOSCOPY N/A 07/26/2016   Dr. [name]: normal esophagus, portal gastropathy, chronic gastritis, normal duodenum, screening EGD 2 years    ESOPHAGOGASTRODUODENOSCOPY (EGD) WITH ESOPHAGEAL DILATION N/A 05/15/2013   KTG:YBWLSL esophagus-status post Venia Minks dilation/Portal gastropathy. Antral erosions-status post biopsy. extrinsic compression along lesser curvature likely secondary to splenomegaly. gastric bx benign   ESOPHAGOGASTRODUODENOSCOPY (EGD) WITH PROPOFOL N/A 12/12/2020   Surgeon: [name], MD; ormal esophagus s/p dilation, portal hypertensive gastropathy.  Due for repeat in 2024.   HERNIA REPAIR  03/2009   MALONEY DILATION N/A 12/12/2020   Procedure: Venia Minks DILATION;  Surgeon: [name], MD;  [facility];  Service: Endoscopy;  Laterality: N/A;   POLYPECTOMY  07/26/2016   Procedure: POLYPECTOMY;  Surgeon: [name], MD;  [facility]: [facility];  Service: Endoscopy;;  colon   TRANSTHORACIC ECHOCARDIOGRAM  2011   EF 37-34%, stage 1 diastolic dysfunction, trace TR & pulm valve regurg    VASECTOMY  1989    Current Outpatient Medications  Medication Sig Dispense Refill   acetaminophen (TYLENOL) 500 MG tablet Take 1,000 mg by mouth as needed for headache.     albuterol (PROVENTIL) (2.5 MG/3ML) 0.083% nebulizer solution Take 3 mLs (2.5 mg total) by nebulization every 2 (two) hours as needed for wheezing or shortness of breath (cough). (Patient taking differently: Take 2.5 mg by nebulization as needed for wheezing or shortness of breath (cough).) 75 mL 1   ALPRAZolam (XANAX) 0.25 MG tablet Take 0.25 mg by mouth 2 (two) times daily as needed for sleep.      aspirin EC 81 MG tablet Take 1 tablet (81 mg total) by mouth daily with breakfast. 30 tablet 5   atorvastatin (LIPITOR) 40 MG tablet Take 40 mg by mouth daily.     Blood  Glucose Monitoring Suppl (ONETOUCH VERIO) w/Device KIT 2 (two) times daily.     budesonide-formoterol (SYMBICORT) 160-4.5 MCG/ACT inhaler Inhale 2 puffs into the lungs 2 (two) times daily.     Cholecalciferol (VITAMIN D3) 25 MCG (1000 UT) CAPS Take 1 capsule by mouth daily.     [employer] VITAMIN C 250 MG tablet Take 250 mg by mouth daily.     diltiazem (CARDIZEM CD) 240 MG 24 hr capsule Take 240 mg by mouth daily.     gabapentin (NEURONTIN) 300 MG capsule Take 1 capsule (300 mg total) by mouth at bedtime. 30 capsule 5   glimepiride (AMARYL) 1 MG tablet Take 1 mg by mouth daily with breakfast.     guaiFENesin (MUCINEX) 600 MG 12 hr tablet Take 1 tablet (600 mg total) by mouth 2 (two) times daily. (Patient taking differently: Take 600 mg by mouth as needed.) 20 tablet 0   guaiFENesin-dextromethorphan (ROBITUSSIN DM) 100-10 MG/5ML syrup Take 5 mLs by mouth every 4 (four) hours as needed for cough (chest congestion). (  Patient taking differently: Take 5 mLs by mouth as needed for cough (chest congestion).) 118 mL 0   JARDIANCE 25 MG TABS tablet Take 25 mg by mouth every evening.   3   metFORMIN (GLUCOPHAGE) 1000 MG tablet Take 1,000 mg by mouth daily. Takes with evening meal.     Multiple Vitamin (MULTIVITAMIN WITH MINERALS) TABS tablet Take 1 tablet by mouth at bedtime. 30 tablet 2   ONETOUCH DELICA LANCETS 83M MISC      ONETOUCH VERIO test strip      OXYGEN Inhale 3 L into the lungs continuous.     pantoprazole (PROTONIX) 40 MG tablet Take 1 tablet (40 mg total) by mouth daily. 90 tablet 3   PROAIR HFA 108 (90 Base) MCG/ACT inhaler Inhale 2 puffs into the lungs every 4 (four) hours as needed for wheezing or shortness of breath. 18 g 12   sertraline (ZOLOFT) 50 MG tablet Take 1 tablet (50 mg total) by mouth daily. 30 tablet 3   SitaGLIPtin-MetFORMIN HCl [id number] MG TB24 Take 1 tablet by mouth daily.     sodium chloride (OCEAN) 0.65 % SOLN nasal spray Place 1 spray into both nostrils as needed for  congestion. 60 mL 0   tamsulosin (FLOMAX) 0.4 MG CAPS capsule Take 0.4 mg by mouth daily.     thiamine 100 MG tablet Take 1 tablet (100 mg total) by mouth daily. 30 tablet 2   Tiotropium Bromide Monohydrate (SPIRIVA RESPIMAT) 2.5 MCG/ACT AERS Inhale 2 puffs into the lungs daily.     polyethylene glycol-electrolytes (NULYTELY) 420 g solution As directed 4000 mL 0   No current facility-administered medications for this visit.    Allergies as of 07/20/2021 - Review Complete 07/20/2021  Allergen Reaction Noted   Baclofen Other (See Comments) 07/24/2016   Simvastatin Nausea Only 10/05/2013    Family History  Problem Relation Age of Onset   Heart disease Mother    Cancer Mother        lymph nodes   Diabetes Mother    Arthritis Other    Lung disease Other    Asthma Other    Kidney disease Other    Ovarian cancer Sister    Diabetes Brother    Heart disease Brother    Hypertension Brother    Diabetes Sister    Diabetes Sister    Cirrhosis Sister 75       no etoh   Colon cancer Neg Hx     Social History   Socioeconomic History   Marital status: Married    Spouse name: Not on file   Number of children: Not on file   Years of education: Not on file   Highest education level: Not on file  Occupational History   Not on file  Tobacco Use   Smoking status: Former    Packs/day: 1.25    Years: 36.00    Pack years: 45.00    Types: Cigarettes    Quit date: 04/2011    Years since quitting: 10.2   Smokeless tobacco: Former  Scientific laboratory technician Use: Never used  Substance and Sexual Activity   Alcohol use: No   Drug use: No   Sexual activity: Not on file  Other Topics Concern   Not on file  Social History Narrative   Not on file   Social Determinants of Health   Financial Resource Strain: Low Risk    Difficulty of Paying Living Expenses: Not hard at all  Food Insecurity:  No Food Insecurity   Worried About Charity fundraiser in the Last Year: Never true   Ran Out of  Food in the Last Year: Never true  Transportation Needs: No Transportation Needs   Lack of Transportation (Medical): No   Lack of Transportation (Non-Medical): No  Physical Activity: Sufficiently Active   Days of Exercise per Week: 7 days   Minutes of Exercise per Session: 30 min  Stress: No Stress Concern Present   Feeling of Stress : Not at all  Social Connections: Moderately Integrated   Frequency of Communication with Friends and Family: Three times a week   Frequency of Social Gatherings with Friends and Family: Three times a week   Attends Religious Services: More than 4 times per year   Active Member of Clubs or Organizations: No   Attends Archivist Meetings: Never   Marital Status: Married    Review of Systems: Gen: Denies fever, chills, cold or flulike symptoms, presyncope, syncope. CV: Denies chest pain, palpitations Resp: See HPI GI: See HPI Heme: See HPI  Physical Exam: BP 108/65    Pulse 98    Temp (!) 97.1 F (36.2 C) (Temporal)    Ht _0  (1.702 m)    Wt 163 lb 6.4 oz (74.1 kg)    BMI 25.59 kg/m  General:   Alert and oriented. No distress noted. Pleasant and cooperative.  Head:  Normocephalic and atraumatic. Eyes:  Conjuctiva clear without scleral icterus. Mouth:  Oral mucosa pink and moist. Good dentition. No lesions. Heart:  S1, S2 present without murmurs appreciated. Lungs:  Clear to auscultation bilaterally. No wheezes, rales, or rhonchi. No distress.  Nasal cannula in place on 3 L. Abdomen:  +BS.abdomen is protuberant, but remains soft.  Mild TTP in RUQ.  No rebound or guarding. No HSM or masses noted. Msk:  Symmetrical without gross deformities. Normal posture. Extremities:  With 1+ pitting edema in RLE from foot to 3/4 the way up his shin. No edema of LLE. Neurologic:  Alert and  oriented x4 Psych:  Normal mood and affect.    Assessment: 66 year old male with history of COPD with chronic respiratory failure on supplemental oxygen (3L), type  2 diabetes, HTN, GERD, biopsy-proven [place] fibrosis, elastography F2/F3, chronic thrombocytopenia following with hematology, splenomegaly, portal gastropathy, and currently being followed for routine cirrhosis care.  EGD in June 2022 with normal esophagus s/p dilation, portal hypertensive gastropathy, due for repeat in 2024.  Colonoscopy in January 2018, currently due for surveillance.  He is presenting today for routine follow-up with chief complaint of hemorrhoids with intermittent rectal bleeding.  Rectal bleeding: Intermittent toilet tissue hematochezia couple times a month in the setting of hemorrhoids that have become prolapsed, currently using preparation H as needed with good response.  No rectal exam performed today, but patient does show me a picture of prolapsed hemorrhoids on his phone.  Denies rectal pain.  Blood is bright red and only on toilet tissue.  No blood in toilet water or mixed in his stool.  No abdominal pain or unintentional weight loss.   Toilet tissue hematochezia likely secondary to hemorrhoids, but cannot rule out other colonic sources as he does have history of polyps and is currently due for surveillance colonoscopy.  We will treat hemorrhoids symptomatically and arrange colonoscopy in the near future. Would not be a candidate for hemorrhoid banding in the setting of cirrhosis.   History of adenomatous colon polyps: Due for surveillance colonoscopy.  Last colonoscopy in January  2018 with single 6 mm benign polyp removed, recommended surveillance in 5 years.   [name] Fibrosis/Early Cirrhosis: Biopsy-proven NASH fibrosis in 2018.  Has remained fairly well compensated to this point.  He does continue to bruise easily and has had some epistaxis intermittently in the setting of chronic thrombocytopenia.  More recently with intermittent toilet tissue hematochezia likely secondary to hemorrhoids as per above.  No melena.  No other signs or symptoms of decompensated disease.  MELD 6 on  labs in June 2022. EGD up-to-date in June 2022 with no esophageal varices but evidence of portal hypertensive gastropathy.   He has completed hepatitis A/B vaccine series as of September 2022. Currently due for routine RUQ ultrasound and labs.   GERD:  Well-controlled on Protonix 40 mg daily.  No alarm symptoms.  Right lower extremity edema: Unilateral 1+ pitting edema of right lower extremity extending from foot to 3/4 the way up his shin.  Per patient, this has been present for about 3 weeks.  No associated pain or erythema.  No recent injury.  No edema of left lower extremity.  We will plan for venous ultrasound to rule out DVT.   Plan:  CBC, CMP, INR, AFP RUQ ultrasound. Venous ultrasound right lower extremity. Colonoscopy with propofol with Dr. Abbey Chatters (in the absence of Dr. [name]) in the near future. Patient is agreeable for Dr. Abbey Chatters to perform his procedure. The risks, benefits, and alternatives have been discussed with the patient in detail. The patient states understanding and desires to proceed. ASA 3/4 See separate instructions for diabetes medication adjustments. Use Preparation H twice daily x7-10 days for hemorrhoid flares. Start Benefiber 2 teaspoons daily x2 weeks then increase to twice daily. Limit toilet time to 2-3 minutes. Avoid straining. Continue Protonix 40 mg daily. Nutrition Recommendations High-protein diet from a primarily plant-based diet. Avoid red meat.  No raw or undercooked meat, seafood, or shellfish. Low-fat/cholesterol/carbohydrate diet. Limit sodium to no more than 2000 mg/day including everything that you eat and drink. Continue to be as active as you are able.  Follow-up in 6 months or sooner if needed.    Aliene Altes, PA-C [facility] 07/20/2021

## 2021-07-20 ENCOUNTER — Encounter: Payer: Self-pay | Admitting: Gastroenterology

## 2021-07-20 ENCOUNTER — Encounter: Payer: Self-pay | Admitting: *Deleted

## 2021-07-20 ENCOUNTER — Other Ambulatory Visit: Payer: Self-pay

## 2021-07-20 ENCOUNTER — Ambulatory Visit: Payer: HMO | Admitting: Gastroenterology

## 2021-07-20 VITALS — BP 108/65 | HR 98 | Temp 97.1°F | Ht 67.0 in | Wt 163.4 lb

## 2021-07-20 DIAGNOSIS — K219 Gastro-esophageal reflux disease without esophagitis: Secondary | ICD-10-CM | POA: Diagnosis not present

## 2021-07-20 DIAGNOSIS — K7400 Hepatic fibrosis, unspecified: Secondary | ICD-10-CM

## 2021-07-20 DIAGNOSIS — Z86010 Personal history of colonic polyps: Secondary | ICD-10-CM | POA: Diagnosis not present

## 2021-07-20 DIAGNOSIS — K649 Unspecified hemorrhoids: Secondary | ICD-10-CM | POA: Insufficient documentation

## 2021-07-20 DIAGNOSIS — R600 Localized edema: Secondary | ICD-10-CM | POA: Diagnosis not present

## 2021-07-20 DIAGNOSIS — K625 Hemorrhage of anus and rectum: Secondary | ICD-10-CM | POA: Insufficient documentation

## 2021-07-20 DIAGNOSIS — K7581 Nonalcoholic steatohepatitis (NASH): Secondary | ICD-10-CM | POA: Diagnosis not present

## 2021-07-20 MED ORDER — PEG 3350-KCL-NA BICARB-NACL 420 G PO SOLR: ORAL | 0 refills | Status: DC

## 2021-07-20 NOTE — Patient Instructions (Addendum)
We will arrange for you to have an ultrasound of your right lower leg at [facility] to evaluate the swelling you are having and rule out blood clot.  We will also arrange for you to have an ultrasound of your abdomen for routine screening due to history of liver fibrosis/early cirrhosis.  Please have labs completed at [facility].  We will arrange for you to have a colonoscopy in the near future with Dr. Abbey Chatters and Dr. Roseanne Kaufman absence. 1 day prior to your procedure: Take one half dose of glimepiride, one half dose of metformin, and take Jardiance and Janumet as prescribed. Day of your procedure: Do not take any morning diabetes medications.  While you are on clear liquids in preparation for your colonoscopy, keep a close check on your blood sugars and correct any low blood sugars with improved sugary clear liquids.  For hemorrhoids:  When your hemorrhoids flare, use Preparation H twice daily for 7-10 days. Limit toilet time to 2-3 minutes. Avoid straining. Start Benefiber 2 teaspoons daily x2 weeks then increase to twice daily if tolerated.  For GERD: Continue Protonix 40 mg daily 30 minutes before breakfast.  General Nutrition Recommendations In the Setting Of Liver Fibrosis/Early Cirrhosis:  High-protein diet from a primarily plant-based diet. Avoid red meat.  No raw or undercooked meat, seafood, or shellfish. Low-fat/cholesterol/carbohydrate diet. Limit sodium to no more than 2000 mg/day including everything that you eat and drink. Continue to be as active as you are able.    It was good to see you again today!  We will plan to see you back in 6 months, but do not hesitate to call if you have any questions or concerns prior to your next visit.  Aliene Altes, PA-C [facility]

## 2021-07-23 ENCOUNTER — Other Ambulatory Visit: Payer: Self-pay | Admitting: Gastroenterology

## 2021-07-23 DIAGNOSIS — K219 Gastro-esophageal reflux disease without esophagitis: Secondary | ICD-10-CM

## 2021-07-25 ENCOUNTER — Ambulatory Visit (HOSPITAL_COMMUNITY)
Admission: RE | Admit: 2021-07-25 | Discharge: 2021-07-25 | Disposition: A | Discharge status: Home / Self Care | Payer: HMO | Source: Ambulatory Visit | Attending: Gastroenterology | Admitting: Gastroenterology

## 2021-07-25 ENCOUNTER — Other Ambulatory Visit: Payer: Self-pay

## 2021-07-25 DIAGNOSIS — K7581 Nonalcoholic steatohepatitis (NASH): Secondary | ICD-10-CM | POA: Diagnosis not present

## 2021-07-25 DIAGNOSIS — R600 Localized edema: Secondary | ICD-10-CM | POA: Diagnosis not present

## 2021-07-25 DIAGNOSIS — K7400 Hepatic fibrosis, unspecified: Secondary | ICD-10-CM | POA: Insufficient documentation

## 2021-07-25 DIAGNOSIS — M7989 Other specified soft tissue disorders: Secondary | ICD-10-CM | POA: Diagnosis not present

## 2021-07-28 ENCOUNTER — Encounter: Payer: Self-pay | Admitting: *Deleted

## 2021-07-31 DIAGNOSIS — K7581 Nonalcoholic steatohepatitis (NASH): Secondary | ICD-10-CM | POA: Diagnosis not present

## 2021-07-31 DIAGNOSIS — K625 Hemorrhage of anus and rectum: Secondary | ICD-10-CM | POA: Diagnosis not present

## 2021-07-31 DIAGNOSIS — K7400 Hepatic fibrosis, unspecified: Secondary | ICD-10-CM | POA: Diagnosis not present

## 2021-08-01 LAB — PROTIME-INR
INR: 1 (ref 0.9–1.2)
Prothrombin Time: 10.9 s (ref 9.1–12.0)

## 2021-08-01 LAB — CBC WITH DIFFERENTIAL/PLATELET
Basophils Absolute: 0 10*3/uL (ref 0.0–0.2)
Basos: 1 %
EOS (ABSOLUTE): 0.2 10*3/uL (ref 0.0–0.4)
Eos: 5 %
Hematocrit: 48.3 % (ref 37.5–51.0)
Hemoglobin: 15.6 g/dL (ref 13.0–17.7)
Immature Grans (Abs): 0 10*3/uL (ref 0.0–0.1)
Immature Granulocytes: 0 %
Lymphocytes Absolute: 1 10*3/uL (ref 0.7–3.1)
Lymphs: 31 %
MCH: 26.4 pg — ABNORMAL LOW (ref 26.6–33.0)
MCHC: 32.3 g/dL (ref 31.5–35.7)
MCV: 82 fL (ref 79–97)
Monocytes Absolute: 0.4 10*3/uL (ref 0.1–0.9)
Monocytes: 11 %
Neutrophils Absolute: 1.7 10*3/uL (ref 1.4–7.0)
Neutrophils: 52 %
Platelets: 100 10*3/uL — CL (ref 150–450)
RBC: 5.92 x10E6/uL — ABNORMAL HIGH (ref 4.14–5.80)
RDW: 15.7 % — ABNORMAL HIGH (ref 11.6–15.4)
WBC: 3.3 10*3/uL — ABNORMAL LOW (ref 3.4–10.8)

## 2021-08-01 LAB — CMP14+EGFR
ALT: 32 IU/L (ref 0–44)
AST: 19 IU/L (ref 0–40)
Albumin/Globulin Ratio: 1.5 (ref 1.2–2.2)
Albumin: 4.7 g/dL (ref 3.8–4.8)
Alkaline Phosphatase: 95 IU/L (ref 44–121)
BUN/Creatinine Ratio: 14 (ref 10–24)
BUN: 12 mg/dL (ref 8–27)
Bilirubin Total: 0.6 mg/dL (ref 0.0–1.2)
CO2: 27 mmol/L (ref 20–29)
Calcium: 10 mg/dL (ref 8.6–10.2)
Chloride: 103 mmol/L (ref 96–106)
Creatinine, Ser: 0.88 mg/dL (ref 0.76–1.27)
Globulin, Total: 3.2 g/dL (ref 1.5–4.5)
Glucose: 157 mg/dL — ABNORMAL HIGH (ref 70–99)
Potassium: 4.3 mmol/L (ref 3.5–5.2)
Sodium: 144 mmol/L (ref 134–144)
Total Protein: 7.9 g/dL (ref 6.0–8.5)
eGFR: 95 mL/min/{1.73_m2} (ref >59)

## 2021-08-01 LAB — AFP TUMOR MARKER: AFP, Serum, Tumor Marker: 2.2 ng/mL (ref 0.0–8.4)

## 2021-08-01 NOTE — Patient Instructions (Signed)
KHUP SAPIA  08/01/2021     @PREFPERIOPPHARMACY @   Your procedure is scheduled on  08/07/2021.   Report to [facility] at  0830 A.M.   Call this number if you have problems the morning of surgery:  [phone]   Remember:  Follow the diet and prep instructions given to you by the office.    Take these medicines the morning of surgery with A SIP OF WATER                xanax(if needed), diltiazem, protonix, zoloft, flomax.     Do not wear jewelry, make-up or nail polish.  Do not wear lotions, powders, or perfumes, or deodorant.  Do not shave 48 hours prior to surgery.  Men may shave face and neck.  Do not bring valuables to the hospital.  [facility] is not responsible for any belongings or valuables.  Contacts, dentures or bridgework may not be worn into surgery.  Leave your suitcase in the car.  After surgery it may be brought to your room.  For patients admitted to the hospital, discharge time will be determined by your treatment team.  Patients discharged the day of surgery will not be allowed to drive home and must have someone with them for 24 hours.    Special instructions:   DO NOT smoke tobacco or vape for 24 hours before your procedure.  Please read over the following fact sheets that you were given. Anesthesia Post-op Instructions and Care and Recovery After Surgery      Colonoscopy, Adult, Care After This sheet gives you information about how to care for yourself after your procedure. Your health care provider may also give you more specific instructions. If you have problems or questions, contact your health care provider. What can I expect after the procedure? After the procedure, it is common to have: A small amount of blood in your stool for 24 hours after the procedure. Some gas. Mild cramping or bloating of your abdomen. Follow these instructions at home: Eating and drinking  Drink enough fluid to keep your urine pale yellow. Follow  instructions from your health care provider about eating or drinking restrictions. Resume your normal diet as instructed by your health care provider. Avoid heavy or fried foods that are hard to digest. Activity Rest as told by your health care provider. Avoid sitting for a long time without moving. Get up to take short walks every 1-2 hours. This is important to improve blood flow and breathing. Ask for help if you feel weak or unsteady. Return to your normal activities as told by your health care provider. Ask your health care provider what activities are safe for you. Managing cramping and bloating  Try walking around when you have cramps or feel bloated. Apply heat to your abdomen as told by your health care provider. Use the heat source that your health care provider recommends, such as a moist heat pack or a heating pad. Place a towel between your skin and the heat source. Leave the heat on for 20-30 minutes. Remove the heat if your skin turns bright red. This is especially important if you are unable to feel pain, heat, or cold. You may have a greater risk of getting burned. General instructions If you were given a sedative during the procedure, it can affect you for several hours. Do not drive or operate machinery until your health care provider says that it is safe. For the  first 24 hours after the procedure: Do not sign important documents. Do not drink alcohol. Do your regular daily activities at a slower pace than normal. Eat soft foods that are easy to digest. Take over-the-counter and prescription medicines only as told by your health care provider. Keep all follow-up visits as told by your health care provider. This is important. Contact a health care provider if: You have blood in your stool 2-3 days after the procedure. Get help right away if you have: More than a small spotting of blood in your stool. Large blood clots in your stool. Swelling of your abdomen. Nausea or  vomiting. A fever. Increasing pain in your abdomen that is not relieved with medicine. Summary After the procedure, it is common to have a small amount of blood in your stool. You may also have mild cramping and bloating of your abdomen. If you were given a sedative during the procedure, it can affect you for several hours. Do not drive or operate machinery until your health care provider says that it is safe. Get help right away if you have a lot of blood in your stool, nausea or vomiting, a fever, or increased pain in your abdomen. This information is not intended to replace advice given to you by your health care provider. Make sure you discuss any questions you have with your health care provider. Document Revised: 05/01/2019 Document Reviewed: 01/19/2019 Elsevier Patient Education  [place] After This sheet gives you information about how to care for yourself after your procedure. Your health care provider may also give you more specific instructions. If you have problems or questions, contact your health care provider. What can I expect after the procedure? After the procedure, it is common to have: Tiredness. Forgetfulness about what happened after the procedure. Impaired judgment for important decisions. Nausea or vomiting. Some difficulty with balance. Follow these instructions at home: For the time period you were told by your health care provider:   Rest as needed. Do not participate in activities where you could fall or become injured. Do not drive or use machinery. Do not drink alcohol. Do not take sleeping pills or medicines that cause drowsiness. Do not make important decisions or sign legal documents. Do not take care of children on your own. Eating and drinking Follow the diet that is recommended by your health care provider. Drink enough fluid to keep your urine pale yellow. If you vomit: Drink water, juice, or soup when  you can drink without vomiting. Make sure you have little or no nausea before eating solid foods. General instructions Have a responsible adult stay with you for the time you are told. It is important to have someone help care for you until you are awake and alert. Take over-the-counter and prescription medicines only as told by your health care provider. If you have sleep apnea, surgery and certain medicines can increase your risk for breathing problems. Follow instructions from your health care provider about wearing your sleep device: Anytime you are sleeping, including during daytime naps. While taking prescription pain medicines, sleeping medicines, or medicines that make you drowsy. Avoid smoking. Keep all follow-up visits as told by your health care provider. This is important. Contact a health care provider if: You keep feeling nauseous or you keep vomiting. You feel light-headed. You are still sleepy or having trouble with balance after 24 hours. You develop a rash. You have a fever. You have redness or swelling around the  IV site. Get help right away if: You have trouble breathing. You have new-onset confusion at home. Summary For several hours after your procedure, you may feel tired. You may also be forgetful and have poor judgment. Have a responsible adult stay with you for the time you are told. It is important to have someone help care for you until you are awake and alert. Rest as told. Do not drive or operate machinery. Do not drink alcohol or take sleeping pills. Get help right away if you have trouble breathing, or if you suddenly become confused. This information is not intended to replace advice given to you by your health care provider. Make sure you discuss any questions you have with your health care provider. Document Revised: 03/10/2020 Document Reviewed: 05/28/2019 Elsevier Patient Education  2022 [employer].

## 2021-08-02 ENCOUNTER — Other Ambulatory Visit: Payer: Self-pay

## 2021-08-02 ENCOUNTER — Encounter (HOSPITAL_COMMUNITY): Payer: Self-pay

## 2021-08-02 ENCOUNTER — Encounter (HOSPITAL_COMMUNITY)
Admission: RE | Admit: 2021-08-02 | Discharge: 2021-08-02 | Disposition: A | Discharge status: Home / Self Care | Payer: HMO | Source: Ambulatory Visit | Attending: Internal Medicine | Admitting: Internal Medicine

## 2021-08-07 ENCOUNTER — Encounter (HOSPITAL_COMMUNITY)
Admission: RE | Disposition: A | Discharge status: Home / Self Care | Payer: Self-pay | Source: Home / Self Care | Attending: Internal Medicine

## 2021-08-07 ENCOUNTER — Ambulatory Visit (HOSPITAL_COMMUNITY): Payer: HMO | Admitting: Anesthesiology

## 2021-08-07 ENCOUNTER — Ambulatory Visit (HOSPITAL_COMMUNITY)
Admission: RE | Admit: 2021-08-07 | Discharge: 2021-08-07 | Disposition: A | Discharge status: Home / Self Care | Payer: HMO | Attending: Internal Medicine | Admitting: Internal Medicine

## 2021-08-07 ENCOUNTER — Other Ambulatory Visit: Payer: Self-pay

## 2021-08-07 ENCOUNTER — Encounter (HOSPITAL_COMMUNITY): Payer: Self-pay

## 2021-08-07 DIAGNOSIS — R161 Splenomegaly, not elsewhere classified: Secondary | ICD-10-CM | POA: Diagnosis not present

## 2021-08-07 DIAGNOSIS — J449 Chronic obstructive pulmonary disease, unspecified: Secondary | ICD-10-CM | POA: Diagnosis not present

## 2021-08-07 DIAGNOSIS — E119 Type 2 diabetes mellitus without complications: Secondary | ICD-10-CM | POA: Insufficient documentation

## 2021-08-07 DIAGNOSIS — K219 Gastro-esophageal reflux disease without esophagitis: Secondary | ICD-10-CM | POA: Diagnosis not present

## 2021-08-07 DIAGNOSIS — Z1211 Encounter for screening for malignant neoplasm of colon: Secondary | ICD-10-CM | POA: Diagnosis not present

## 2021-08-07 DIAGNOSIS — R0600 Dyspnea, unspecified: Secondary | ICD-10-CM | POA: Insufficient documentation

## 2021-08-07 DIAGNOSIS — J441 Chronic obstructive pulmonary disease with (acute) exacerbation: Secondary | ICD-10-CM | POA: Diagnosis not present

## 2021-08-07 DIAGNOSIS — Z7984 Long term (current) use of oral hypoglycemic drugs: Secondary | ICD-10-CM | POA: Insufficient documentation

## 2021-08-07 DIAGNOSIS — Z86010 Personal history of colonic polyps: Secondary | ICD-10-CM | POA: Diagnosis not present

## 2021-08-07 DIAGNOSIS — I10 Essential (primary) hypertension: Secondary | ICD-10-CM | POA: Diagnosis not present

## 2021-08-07 DIAGNOSIS — D123 Benign neoplasm of transverse colon: Secondary | ICD-10-CM | POA: Diagnosis not present

## 2021-08-07 DIAGNOSIS — K648 Other hemorrhoids: Secondary | ICD-10-CM | POA: Insufficient documentation

## 2021-08-07 DIAGNOSIS — D696 Thrombocytopenia, unspecified: Secondary | ICD-10-CM | POA: Diagnosis not present

## 2021-08-07 DIAGNOSIS — Z09 Encounter for follow-up examination after completed treatment for conditions other than malignant neoplasm: Secondary | ICD-10-CM | POA: Insufficient documentation

## 2021-08-07 DIAGNOSIS — Z79899 Other long term (current) drug therapy: Secondary | ICD-10-CM | POA: Diagnosis not present

## 2021-08-07 DIAGNOSIS — K7460 Unspecified cirrhosis of liver: Secondary | ICD-10-CM | POA: Diagnosis not present

## 2021-08-07 DIAGNOSIS — K766 Portal hypertension: Secondary | ICD-10-CM | POA: Diagnosis not present

## 2021-08-07 DIAGNOSIS — K7581 Nonalcoholic steatohepatitis (NASH): Secondary | ICD-10-CM | POA: Diagnosis not present

## 2021-08-07 DIAGNOSIS — Z87891 Personal history of nicotine dependence: Secondary | ICD-10-CM | POA: Diagnosis not present

## 2021-08-07 DIAGNOSIS — K635 Polyp of colon: Secondary | ICD-10-CM | POA: Diagnosis not present

## 2021-08-07 HISTORY — PX: COLONOSCOPY WITH PROPOFOL: SHX5780

## 2021-08-07 HISTORY — PX: POLYPECTOMY: SHX5525

## 2021-08-07 LAB — GLUCOSE, CAPILLARY: Glucose-Capillary: 117 mg/dL — ABNORMAL HIGH (ref 70–99)

## 2021-08-07 SURGERY — COLONOSCOPY WITH PROPOFOL
Anesthesia: General

## 2021-08-07 MED ORDER — PROPOFOL 10 MG/ML IV BOLUS
INTRAVENOUS | Status: DC | PRN
  Administered 2021-08-07 (×2): 50 mg via INTRAVENOUS
  Administered 2021-08-07: 100 mg via INTRAVENOUS

## 2021-08-07 MED ORDER — LIDOCAINE HCL (CARDIAC) PF 100 MG/5ML IV SOSY
PREFILLED_SYRINGE | INTRAVENOUS | Status: DC | PRN
  Administered 2021-08-07: 50 mg via INTRAVENOUS

## 2021-08-07 MED ORDER — LACTATED RINGERS IV SOLN: INTRAVENOUS | Status: DC

## 2021-08-07 NOTE — Interval H&P Note (Signed)
History and Physical Interval Note:  08/07/2021 10:06 AM  [name]  has presented today for surgery, with the diagnosis of RB, hx colon polyps, hemorrhoids.  The various methods of treatment have been discussed with the patient and family. After consideration of risks, benefits and other options for treatment, the patient has consented to  Procedure(s) with comments: COLONOSCOPY WITH PROPOFOL (N/A) - 10:30am as a surgical intervention.  The patient's history has been reviewed, patient examined, no change in status, stable for surgery.  I have reviewed the patient's chart and labs.  Questions were answered to the patient's satisfaction.     Eloise Harman

## 2021-08-07 NOTE — Discharge Instructions (Addendum)
°  Colonoscopy Discharge Instructions  Read the instructions outlined below and refer to this sheet in the next few weeks. These discharge instructions provide you with general information on caring for yourself after you leave the hospital. Your doctor may also give you specific instructions. While your treatment has been planned according to the most current medical practices available, unavoidable complications occasionally occur.   ACTIVITY You may resume your regular activity, but move at a slower pace for the next 24 hours.  Take frequent rest periods for the next 24 hours.  Walking will help get rid of the air and reduce the bloated feeling in your belly (abdomen).  No driving for 24 hours (because of the medicine (anesthesia) used during the test).   Do not sign any important legal documents or operate any machinery for 24 hours (because of the anesthesia used during the test).  NUTRITION Drink plenty of fluids.  You may resume your normal diet as instructed by your doctor.  Begin with a light meal and progress to your normal diet. Heavy or fried foods are harder to digest and may make you feel sick to your stomach (nauseated).  Avoid alcoholic beverages for 24 hours or as instructed.  MEDICATIONS You may resume your normal medications unless your doctor tells you otherwise.  WHAT YOU CAN EXPECT TODAY Some feelings of bloating in the abdomen.  Passage of more gas than usual.  Spotting of blood in your stool or on the toilet paper.  IF YOU HAD POLYPS REMOVED DURING THE COLONOSCOPY: No aspirin products for 7 days or as instructed.  No alcohol for 7 days or as instructed.  Eat a soft diet for the next 24 hours.  FINDING OUT THE RESULTS OF YOUR TEST Not all test results are available during your visit. If your test results are not back during the visit, make an appointment with your caregiver to find out the results. Do not assume everything is normal if you have not heard from your  caregiver or the medical facility. It is important for you to follow up on all of your test results.  SEEK IMMEDIATE MEDICAL ATTENTION IF: You have more than a spotting of blood in your stool.  Your belly is swollen (abdominal distention).  You are nauseated or vomiting.  You have a temperature over 101.  You have abdominal pain or discomfort that is severe or gets worse throughout the day.   Your colonoscopy revealed 3 polyp(s) which I removed successfully. Await pathology results, my office will contact you. I recommend repeating colonoscopy in 5 years for surveillance purposes. Otherwise follow up with GI [facility]ourning) in 6 months.    I hope you have a great rest of your week!  Elon Alas. Abbey Chatters, D.O. [facility]

## 2021-08-07 NOTE — Anesthesia Postprocedure Evaluation (Signed)
Anesthesia Post Note  Patient: [name]  Procedure(s) Performed: COLONOSCOPY WITH PROPOFOL POLYPECTOMY  Patient location during evaluation: Phase II Anesthesia Type: General Level of consciousness: awake and alert and oriented Pain management: pain level controlled Vital Signs Assessment: post-procedure vital signs reviewed and stable Respiratory status: spontaneous breathing, nonlabored ventilation and respiratory function stable Cardiovascular status: blood pressure returned to baseline and stable Postop Assessment: no apparent nausea or vomiting Anesthetic complications: no   No notable events documented.   Last Vitals:  Vitals:   08/07/21 0930 08/07/21 1046  BP: 134/75 99/62  Pulse:  79  Resp: (!) 22 15  Temp: 36.6 C 36.6 C  SpO2: 96% 98%    Last Pain:  Vitals:   08/07/21 1046  TempSrc: Oral  PainSc: 0-No pain                 Renad Jenniges C Azaela Caracci

## 2021-08-07 NOTE — Anesthesia Procedure Notes (Signed)
Date/Time: 08/07/2021 10:31 AM Performed by: Orlie Dakin, CRNA Pre-anesthesia Checklist: Patient identified, Emergency Drugs available, Suction available and Patient being monitored Patient Re-evaluated:Patient Re-evaluated prior to induction Oxygen Delivery Method: Nasal cannula Induction Type: IV induction Placement Confirmation: positive ETCO2

## 2021-08-07 NOTE — Anesthesia Preprocedure Evaluation (Signed)
Anesthesia Evaluation  Patient identified by MRN, date of birth, ID band Patient awake    Reviewed: Allergy & Precautions, NPO status , Patient's Chart, lab work & pertinent test results  History of Anesthesia Complications (+) PONV and history of anesthetic complications  Airway Mallampati: II  TM Distance: >3 FB Neck ROM: Full    Dental  (+) Upper Dentures, Missing   Pulmonary shortness of breath and Long-Term Oxygen Therapy, asthma , pneumonia, COPD,  oxygen dependent, former smoker,    Pulmonary exam normal breath sounds clear to auscultation       Cardiovascular hypertension, + DOE  Normal cardiovascular exam+ dysrhythmias  Rhythm:Regular Rate:Normal     Neuro/Psych PSYCHIATRIC DISORDERS Anxiety Depression negative neurological ROS     GI/Hepatic GERD  Medicated,(+) Hepatitis -  Endo/Other  negative endocrine ROSdiabetes, Well Controlled, Type 2, Oral Hypoglycemic Agents  Renal/GU negative Renal ROS  negative genitourinary   Musculoskeletal  (+) Arthritis , Osteoarthritis,    Abdominal   Peds negative pediatric ROS (+)  Hematology negative hematology ROS (+)   Anesthesia Other Findings   Reproductive/Obstetrics negative OB ROS                           Anesthesia Physical Anesthesia Plan  ASA: 4  Anesthesia Plan: General   Post-op Pain Management: Minimal or no pain anticipated   Induction: Intravenous  PONV Risk Score and Plan:   Airway Management Planned: Nasal Cannula, Natural Airway and Simple Face Mask  Additional Equipment:   Intra-op Plan:   Post-operative Plan:   Informed Consent: I have reviewed the patients History and Physical, chart, labs and discussed the procedure including the risks, benefits and alternatives for the proposed anesthesia with the patient or authorized representative who has indicated his/her understanding and acceptance.     Dental  advisory given  Plan Discussed with: CRNA and Surgeon  Anesthesia Plan Comments:         Anesthesia Quick Evaluation

## 2021-08-07 NOTE — Op Note (Addendum)
[facility] Patient Name: [name] Procedure Date: 08/07/2021 10:24 AM MRN: [id number] Date of Birth: 1955-10-16 Attending MD: Elon Alas. Abbey Chatters DO CSN: [id number] Age: 66 Admit Type: Outpatient Procedure:                Colonoscopy Indications:              High risk colon cancer surveillance: Personal                            history of colonic polyps Providers:                Elon Alas. Abbey Chatters, DO, Hughie Closs RN, RN, Rosina Lowenstein, RN, Aram Candela Referring MD:              Medicines:                See the Anesthesia note for documentation of the                            administered medications Complications:            No immediate complications. Estimated Blood Loss:     Estimated blood loss was minimal. Procedure:                Pre-Anesthesia Assessment:                           - The anesthesia plan was to use monitored                            anesthesia care (MAC).                           After obtaining informed consent, the colonoscope                            was passed under direct vision. Throughout the                            procedure, the patient's blood pressure, pulse, and                            oxygen saturations were monitored continuously. The                            [id number]-HQ190L ([id number]) scope was introduced through                            the anus and advanced to the the cecum, identified                            by appendiceal orifice and ileocecal valve. The                            colonoscopy was performed without  difficulty. The                            patient tolerated the procedure well. The quality                            of the bowel preparation was evaluated using the                            BBPS [facility] Bowel Preparation Scale) with scores                            of: Right Colon = 3, Transverse Colon = 3 and Left                            Colon = 3 (entire mucosa seen  well with no residual                            staining, small fragments of stool or opaque                            liquid). The total BBPS score equals 9. Scope In: 10:30:37 AM Scope Out: 10:44:55 AM Scope Withdrawal Time: 0 hours 11 minutes 40 seconds  Total Procedure Duration: 0 hours 14 minutes 18 seconds  Findings:      The perianal and digital rectal examinations were normal.      Non-bleeding internal hemorrhoids were found during endoscopy.      Three sessile polyps were found in the transverse colon. The polyps were       3 to 6 mm in size. These polyps were removed with a cold snare.       Resection and retrieval were complete.      The exam was otherwise without abnormality. Impression:               - Non-bleeding internal hemorrhoids.                           - Three 3 to 6 mm polyps in the transverse colon,                            removed with a cold snare. Resected and retrieved.                           - The examination was otherwise normal. Moderate Sedation:      Per Anesthesia Care Recommendation:           - Patient has a contact number available for                            emergencies. The signs and symptoms of potential                            delayed complications were discussed with the  patient. Return to normal activities tomorrow.                            Written discharge instructions were provided to the                            patient.                           - Resume previous diet.                           - Continue present medications.                           - Await pathology results.                           - Repeat colonoscopy in 5 years for surveillance.                           - Return to GI clinic in 6 months. Procedure Code(s):        --- Professional ---                           [phone], Colonoscopy, flexible; with removal of                            tumor(s), polyp(s), or other  lesion(s) by snare                            technique Diagnosis Code(s):        --- Professional ---                           Z86.010, Personal history of colonic polyps                           K63.5, Polyp of colon                           K64.8, Other hemorrhoids CPT copyright 2019 American Medical Association. All rights reserved. The codes documented in this report are preliminary and upon coder review may  be revised to meet current compliance requirements. Elon Alas. Abbey Chatters, DO [place] Abbey Chatters, DO 08/07/2021 10:47:56 AM This report has been signed electronically. Number of Addenda: 0

## 2021-08-07 NOTE — Transfer of Care (Signed)
Immediate Anesthesia Transfer of Care Note  Patient: [name]  Procedure(s) Performed: COLONOSCOPY WITH PROPOFOL POLYPECTOMY  Patient Location: Short Stay  Anesthesia Type:General  Level of Consciousness: awake, alert  and oriented  Airway & Oxygen Therapy: Patient Spontanous Breathing  Post-op Assessment: Report given to RN and Post -op Vital signs reviewed and stable  Post vital signs: Reviewed and stable  Last Vitals:  Vitals Value Taken Time  BP    Temp    Pulse    Resp    SpO2      Last Pain:  Vitals:   08/07/21 1026  TempSrc:   PainSc: 0-No pain      Patients Stated Pain Goal: 6 (98/72/15 [id number])  Complications: No notable events documented.

## 2021-08-08 ENCOUNTER — Telehealth: Payer: Self-pay | Admitting: Internal Medicine

## 2021-08-08 NOTE — Telephone Encounter (Signed)
Recall for ruq ultrasound

## 2021-08-08 NOTE — Telephone Encounter (Signed)
Pt had [place] abd RUQ 07/25/21. Next [place] due 01/2022.

## 2021-08-09 DIAGNOSIS — E1140 Type 2 diabetes mellitus with diabetic neuropathy, unspecified: Secondary | ICD-10-CM | POA: Diagnosis not present

## 2021-08-09 DIAGNOSIS — I10 Essential (primary) hypertension: Secondary | ICD-10-CM | POA: Diagnosis not present

## 2021-08-09 DIAGNOSIS — E785 Hyperlipidemia, unspecified: Secondary | ICD-10-CM | POA: Diagnosis not present

## 2021-08-09 LAB — SURGICAL PATHOLOGY

## 2021-08-10 ENCOUNTER — Encounter (HOSPITAL_COMMUNITY): Payer: Self-pay | Admitting: Internal Medicine

## 2021-09-01 ENCOUNTER — Encounter: Payer: Self-pay | Admitting: Internal Medicine

## 2021-09-01 ENCOUNTER — Ambulatory Visit: Payer: HMO | Admitting: Internal Medicine

## 2021-09-01 ENCOUNTER — Other Ambulatory Visit: Payer: Self-pay

## 2021-09-01 DIAGNOSIS — J449 Chronic obstructive pulmonary disease, unspecified: Secondary | ICD-10-CM | POA: Diagnosis not present

## 2021-09-01 DIAGNOSIS — J9611 Chronic respiratory failure with hypoxia: Secondary | ICD-10-CM | POA: Diagnosis not present

## 2021-09-01 NOTE — Progress Notes (Signed)
[name], male    DOB: 06/13/1956     MRN: [id number]   Brief patient profile:  48 yowm MM/quit smoking 2012 with COPD GOLD III criteria 06/17/20 previously followed by Dr [name] referred back to pulmonary clinic 12/09/2019 in [facility].   History of Present Illness  12/09/2019  Pulmonary/ 1st office eval/Laelani Vasko  symbicort 160/spiriva  Chief Complaint  Patient presents with   Pulmonary Consult    Former patient of Dr [name].  Breathing has been worse over the past few wks. Recently treated with Doxy per PCP for COPD exacerbation. He is occ coughing up some green sputum.   flare 11/23/19  rx doxy 5/19 x 7 days  Dyspnea:  Baseline uses [facility] parking / MMRC3 = can't walk 100 yards even at a slow pace at a flat grade s stopping due to sob  On 2lpm  Cough: improved less purulent purulent  Sleep: wakes up coughing since onset /sometimes uses saba proair  SABA use: rarely daytime rec Continue protonix 40 mg Take 30-60 min before first meal of the day  GERD diet  Plan A = Automatic = Always=    Budesonide/formoterol (symbicort ) 2 puffs first thing in am and 12 hours later and use spiriva x 2 pffs after the morning symbicort (ok to change back to the powder after 2 weeks but will need to stop it if you start coughing  Work on inhaler technique  Plan B = Backup (to supplement plan A, not to replace it) Only use your albuterol inhaler as a rescue medication    Plan C = Crisis (instead of Plan B   Make sure you check your oxygen saturations at highest level of activity to be sure it stays over 90%   Prednisone 10 mg take  4 each am x 2 days,   2 each am x 2 days,  1 each am x 2 days and stop  For cough > mucinex dm 1200 mg every 12 hours as needed  Would wait another 2 weeks before you get your first shot for covid 19    02/03/2020  f/u ov/Rodrigus Kilker re: GOLD III/ 02 dep/ rx symb /spriva powder  Chief Complaint  Patient presents with   Acute Visit    shortness of breath with exertion, productive  cough with green phlegm  Dyspnea: abruptly worse x 48 h = MMRC3 = can't walk 100 yards even at a slow pace at a flat grade s stopping due to sob   Cough: worse with green mucus/ worse since changed back to dpi spiriva Sleeping: 2 am tends to cramp SABA use: none on day of ov  02: 2lpm  Up to 2.5 lpm  rec zpak Prednisone 10 mg take  4 each am x 2 days,   2 each am x 2 days,  1 each am x 2 days and stop  Change spiriva to respimat 2 puff each am  For cough mucinex dm up to 1200 mg every 12 hours and supplement hydocodone every 4 hours as needed  Get your covid vaccination asap We will call [employer] for a best fit analysis for portable 0xygen  In meantime, Make sure you check your oxygen saturations at highest level of activity to be sure it stays over 90% and adjust upward to maintain this level if needed but remember to turn it back to previous settings when you stop (to conserve your supply).    [name] eval 08/01/20 rec ST eval done 08/05/20: low risk  asp SLP Diet Recommendations Regular solids;Thin liquid  Liquid Administration via Straw;Cup  Medication Administration Whole meds with liquid  Compensations Slow rate;Small sips/bites  Postural Changes Seated upright at 90 degrees;Remain semi-upright after after feeds/meals (Comment)    06/06/2021  f/u ov/[place] office/Rease Wence re: GOLD 3/ 02 dep / maint on spiriva/ symbicort 160   Chief Complaint  Patient presents with   Follow-up    Feels breathing has worsened since last OV.  3lpm cont. At home 2.5 lpm cont. When out of house to conserve O2  Dyspnea:  sob at rest on 3lpm 24/7  Cough: slt green then clear  Sleeping: bed blocks / 3 pillows new  SABA use: 2-3 x per weeks/ no neb  02: 3lpm 24/7  Covid status: vax x 3  Lung cancer screening: scheduled for 06/28/21  Chills s sweats x 24 h Recurrent swelling R > L  Fleeting L cp cleared up p levoquin Rec Plan A = Automatic = Always=    Symbicort Take 2 puffs first thing in am and then  another 2 puffs about 12 hours later and spiriva respimat 2 puffs after am symbicort  Plan B = Backup (to supplement plan A, not to replace it) Only use your albuterol inhaler as a rescue medication  Plan C = Crisis (instead of Plan B but only if Plan B stops working) - only use your albuterol nebulizer if you first try Plan B and it fails to help Make sure you check your oxygen saturation  at your highest level of activity  to be sure it stays over 90%  Zpak  Prednisone 10 mg take  4 each am x 2 days,   2 each am x 2 days,  1 each am x 2 days and stop Please schedule a follow up visit in 3 months but call sooner if needed    09/01/2021  f/u ov/[place] office/Khristopher Kapaun re: GOLD 3 COPD  maint on symb/spriva/02   Chief Complaint  Patient presents with   Follow-up    Breathing has improved. 3LO2 all of the time cont.   Dyspnea:  walks to [place] and back = > 100 ft and flat on 02 3lpm cont  Cough: none/ some sneezing  Sleeping: bed blocks/ 3 pillows SABA use: rarely  02: 3lpm sleeping       No obvious day to day or daytime variability or assoc excess/ purulent sputum or mucus plugs or hemoptysis or cp or chest tightness, subjective wheeze or overt sinus or hb symptoms.   Sleeping  without nocturnal  or early am exacerbation  of respiratory  c/o's or need for noct saba. Also denies any obvious fluctuation of symptoms with weather or environmental changes or other aggravating or alleviating factors except as outlined above   No unusual exposure hx or h/o childhood pna/ asthma or knowledge of premature birth.  Current Allergies, Complete Past Medical History, Past Surgical History, Family History, and Social History were reviewed in [employer] record.  ROS  The following are not active complaints unless bolded Hoarseness, sore throat, dysphagia, dental problems, itching, sneezing,  nasal congestion or discharge of excess mucus or purulent secretions, ear ache,   fever,  chills, sweats, unintended wt loss or wt gain, classically pleuritic or exertional cp,  orthopnea pnd or arm/hand swelling  or leg swelling, presyncope, palpitations, abdominal pain, anorexia, nausea, vomiting, diarrhea  or change in bowel habits or change in bladder habits, change in stools or change in urine, dysuria,  hematuria,  rash, arthralgias, visual complaints, headache, numbness, weakness or ataxia or problems with walking or coordination,  change in mood or  memory.        Current Meds  Medication Sig   acetaminophen (TYLENOL) 500 MG tablet Take 1,000 mg by mouth every 6 (six) hours as needed for headache or moderate pain.   albuterol (PROVENTIL) (2.5 MG/3ML) 0.083% nebulizer solution Take 3 mLs (2.5 mg total) by nebulization every 2 (two) hours as needed for wheezing or shortness of breath (cough).   ALPRAZolam (XANAX) 0.25 MG tablet Take 0.25 mg by mouth 2 (two) times daily as needed for sleep.    aspirin EC 81 MG tablet Take 1 tablet (81 mg total) by mouth daily with breakfast.   atorvastatin (LIPITOR) 40 MG tablet Take 40 mg by mouth daily.   Blood Glucose Monitoring Suppl (ONETOUCH VERIO) w/Device KIT 2 (two) times daily.   budesonide-formoterol (SYMBICORT) 160-4.5 MCG/ACT inhaler Inhale 2 puffs into the lungs 2 (two) times daily.   Cholecalciferol (VITAMIN D3) 25 MCG (1000 UT) CAPS Take 1,000 Units by mouth daily.   [employer] VITAMIN C 250 MG tablet Take 250 mg by mouth daily.   diltiazem (CARDIZEM CD) 240 MG 24 hr capsule Take 240 mg by mouth daily.   gabapentin (NEURONTIN) 300 MG capsule Take 1 capsule (300 mg total) by mouth at bedtime.   glimepiride (AMARYL) 1 MG tablet Take 1 mg by mouth daily with breakfast.   guaiFENesin (MUCINEX) 600 MG 12 hr tablet Take 1 tablet (600 mg total) by mouth 2 (two) times daily. (Patient taking differently: Take 600 mg by mouth 2 (two) times daily as needed.)   guaiFENesin-dextromethorphan (ROBITUSSIN DM) 100-10 MG/5ML syrup Take 5 mLs by mouth every 4  (four) hours as needed for cough (chest congestion).   JARDIANCE 25 MG TABS tablet Take 25 mg by mouth every evening.    metFORMIN (GLUCOPHAGE) 1000 MG tablet Take 1,000 mg by mouth daily with supper.   Multiple Vitamin (MULTIVITAMIN WITH MINERALS) TABS tablet Take 1 tablet by mouth at bedtime.   ONETOUCH DELICA LANCETS 05W MISC    ONETOUCH VERIO test strip    OXYGEN Inhale 3 L into the lungs continuous.   pantoprazole (PROTONIX) 40 MG tablet TAKE 1 TABLET BY MOUTH EVERY DAY   PROAIR HFA 108 (90 Base) MCG/ACT inhaler Inhale 2 puffs into the lungs every 4 (four) hours as needed for wheezing or shortness of breath.   sertraline (ZOLOFT) 50 MG tablet Take 1 tablet (50 mg total) by mouth daily.   SitaGLIPtin-MetFORMIN HCl [phone] MG TB24 Take 1 tablet by mouth daily.   sodium chloride (OCEAN) 0.65 % SOLN nasal spray Place 1 spray into both nostrils as needed for congestion.   tamsulosin (FLOMAX) 0.4 MG CAPS capsule Take 0.4 mg by mouth daily.   thiamine 100 MG tablet Take 1 tablet (100 mg total) by mouth daily.   Tiotropium Bromide Monohydrate (SPIRIVA RESPIMAT) 2.5 MCG/ACT AERS Inhale 2 puffs into the lungs daily.                 Past Medical History:  Diagnosis Date   Anxiety    Asthma    Chest pain    a. 2003 Cath: nl cors.   COPD (chronic obstructive pulmonary disease) ([place])    Depression    Diabetes mellitus    Dyslipidemia    Dysrhythmia    Fracture 12/15   lower back "L-1"   GERD (gastroesophageal reflux disease)  Hypertension    PONV (postoperative nausea and vomiting)    Splenomegaly        Objective:    09/01/2021        167  06/06/2021      162 12/08/2020          163 09/15/2020        154  05/23/2020      166 04/28/2020      166   12/30/19 162 lb (73.5 kg)  12/21/19 162 lb (73.5 kg)  12/09/19 162 lb (73.5 kg)    Vital signs reviewed  09/01/2021  - Note at rest 02 sats  100% on 3lpm    General appearance:    amb wm nad   HEENT : pt wearing mask not  removed for exam due to covid -19 concerns.    NECK :  without JVD/Nodes/TM/ nl carotid upstrokes bilaterally   LUNGS: no acc muscle use,  Mod barrel  contour chest wall with bilateral  Distant bs s audible wheeze and  without cough on insp or exp maneuvers and mod  Hyperresonant  to  percussion bilaterally     CV:  RRR  no s3 or murmur or increase in P2, and Trace   R > L pedal edema     ABD:  soft and nontender with pos mid insp Hoover's  in the supine position. No bruits or organomegaly appreciated, bowel sounds nl  MS:     ext warm without deformities, calf tenderness, cyanosis or clubbing No obvious joint restrictions   SKIN: warm and dry without lesions    NEURO:  alert, approp, nl sensorium with  no motor or cerebellar deficits apparent.         I personally reviewed images and agree with radiology impression as follows:   Chest LDSCT  06/28/21 Central airways are patent. Centrilobular and paraseptal emphysema. No consolidation, pleural effusion or pneumothorax. Unchanged large calcified nodule of the left upper lobe. Clustered nodular opacities of the bilateral lower lungs, overall decreased in size and number when compared with prior exam.     Assessment

## 2021-09-01 NOTE — Assessment & Plan Note (Signed)
Started on 02 around 2019 As of 02/03/2020  2lpm 24/7  -  02/03/2020 referred to [facility] for Best fit for amb 02 > 3lpm pulsed tank -  04/28/2020   Walked 3lpm pulsed tank  approx   400 ft  @ moderate pace  stopped due to  Sob/tired  with sats still 95%    -  09/15/2020   Walked 3lpm cont approx   200 ft  @ slow/slt undsteady  pace  stopped due to  Sob/fatigue   With sats 91% at rest sats were 86% RA   12/08/2020   Walked RA  approx   500 ft  @ moderate pace  stopped due to  End of study, a little sob, sats still 90%   rec as of 09/01/2021   =  3lpm hs but daytime titrate to sats > 90%   Specifically: Make sure you check your oxygen saturation  AT  your highest level of activity (not after you stop)   to be sure it stays over 90% and adjust  02 flow upward to maintain this level if needed but remember to turn it back to previous settings when you stop (to conserve your supply).           Each maintenance medication was reviewed in detail including emphasizing most importantly the difference between maintenance and prns and under what circumstances the prns are to be triggered using an action plan format where appropriate.  Total time for H and P, chart review, counseling, reviewing hfa/smi/02 device(s) and generating customized AVS unique to this office visit / same day charting = 28 min

## 2021-09-01 NOTE — Assessment & Plan Note (Signed)
Quit smoking 2012 - alpha one AT screen  01/31/17  MM  Level 169 - PFT's  06/18/2019  FEV1 1.12 (35 % ) ratio 0.46  p 0 % improvement from saba p ? prior to study with DLCO  11.91 (48%) corrects to 3.18 (754%)  for alv volume and FV curve classic concavity   - 12/09/2019  After extensive coaching inhaler device,  effectiveness =    90% with smi so try spiriva respimat 2 bid instead of DPI  - 09/15/2020  After extensive coaching inhaler device,  effectiveness =    75% > breztri trial > preferreed sym/ spiriva smi - 12/08/2020  After extensive coaching inhaler device,  effectiveness = 80%    Group D in terms of symptom/risk and laba/lama/ICS  therefore appropriate rx at this point >>>  Symbicort/ spiriva and prn saba

## 2021-09-01 NOTE — Patient Instructions (Addendum)
Make sure you check your oxygen saturation  AT  your highest level of activity (not after you stop)   to be sure it stays over 90% and adjust  02 flow upward to maintain this level if needed but remember to turn it back to previous settings when you stop (to conserve your supply).   No change in medications    Please schedule a follow up visit in 12  months but call sooner if needed

## 2021-09-04 ENCOUNTER — Other Ambulatory Visit: Payer: Self-pay | Admitting: *Deleted

## 2021-09-04 DIAGNOSIS — Z87891 Personal history of nicotine dependence: Secondary | ICD-10-CM

## 2021-09-05 DIAGNOSIS — E785 Hyperlipidemia, unspecified: Secondary | ICD-10-CM | POA: Diagnosis not present

## 2021-09-05 DIAGNOSIS — E1140 Type 2 diabetes mellitus with diabetic neuropathy, unspecified: Secondary | ICD-10-CM | POA: Diagnosis not present

## 2021-09-05 DIAGNOSIS — I10 Essential (primary) hypertension: Secondary | ICD-10-CM | POA: Diagnosis not present

## 2021-09-06 DIAGNOSIS — Z79899 Other long term (current) drug therapy: Secondary | ICD-10-CM | POA: Diagnosis not present

## 2021-09-06 DIAGNOSIS — E1140 Type 2 diabetes mellitus with diabetic neuropathy, unspecified: Secondary | ICD-10-CM | POA: Diagnosis not present

## 2021-09-13 DIAGNOSIS — J0100 Acute maxillary sinusitis, unspecified: Secondary | ICD-10-CM | POA: Diagnosis not present

## 2021-09-13 DIAGNOSIS — E0800 Diabetes mellitus due to underlying condition with hyperosmolarity without nonketotic hyperglycemic-hyperosmolar coma (NKHHC): Secondary | ICD-10-CM | POA: Diagnosis not present

## 2021-09-13 DIAGNOSIS — J9611 Chronic respiratory failure with hypoxia: Secondary | ICD-10-CM | POA: Diagnosis not present

## 2021-09-13 DIAGNOSIS — Z23 Encounter for immunization: Secondary | ICD-10-CM | POA: Diagnosis not present

## 2021-09-20 ENCOUNTER — Other Ambulatory Visit: Payer: Self-pay | Admitting: Otolaryngology

## 2021-09-20 ENCOUNTER — Other Ambulatory Visit (HOSPITAL_COMMUNITY): Payer: Self-pay | Admitting: Otolaryngology

## 2021-09-20 DIAGNOSIS — R040 Epistaxis: Secondary | ICD-10-CM | POA: Diagnosis not present

## 2021-09-20 DIAGNOSIS — J343 Hypertrophy of nasal turbinates: Secondary | ICD-10-CM | POA: Diagnosis not present

## 2021-09-20 DIAGNOSIS — J342 Deviated nasal septum: Secondary | ICD-10-CM | POA: Diagnosis not present

## 2021-09-20 DIAGNOSIS — J310 Chronic rhinitis: Secondary | ICD-10-CM | POA: Diagnosis not present

## 2021-09-20 DIAGNOSIS — J320 Chronic maxillary sinusitis: Secondary | ICD-10-CM

## 2021-09-28 ENCOUNTER — Other Ambulatory Visit: Payer: Self-pay

## 2021-09-28 ENCOUNTER — Ambulatory Visit (HOSPITAL_COMMUNITY)
Admission: RE | Admit: 2021-09-28 | Discharge: 2021-09-28 | Disposition: A | Discharge status: Home / Self Care | Payer: HMO | Source: Ambulatory Visit | Attending: Otolaryngology | Admitting: Otolaryngology

## 2021-09-28 DIAGNOSIS — J342 Deviated nasal septum: Secondary | ICD-10-CM | POA: Diagnosis not present

## 2021-09-28 DIAGNOSIS — J320 Chronic maxillary sinusitis: Secondary | ICD-10-CM | POA: Diagnosis not present

## 2021-09-28 DIAGNOSIS — J329 Chronic sinusitis, unspecified: Secondary | ICD-10-CM | POA: Diagnosis not present

## 2021-10-26 DIAGNOSIS — J343 Hypertrophy of nasal turbinates: Secondary | ICD-10-CM | POA: Diagnosis not present

## 2021-10-26 DIAGNOSIS — J342 Deviated nasal septum: Secondary | ICD-10-CM | POA: Diagnosis not present

## 2021-10-26 DIAGNOSIS — J322 Chronic ethmoidal sinusitis: Secondary | ICD-10-CM | POA: Diagnosis not present

## 2021-10-26 DIAGNOSIS — J320 Chronic maxillary sinusitis: Secondary | ICD-10-CM | POA: Diagnosis not present

## 2021-10-27 ENCOUNTER — Other Ambulatory Visit: Payer: Self-pay | Admitting: Otolaryngology

## 2021-11-03 DIAGNOSIS — D692 Other nonthrombocytopenic purpura: Secondary | ICD-10-CM | POA: Diagnosis not present

## 2021-11-03 DIAGNOSIS — Z6827 Body mass index (BMI) 27.0-27.9, adult: Secondary | ICD-10-CM | POA: Diagnosis not present

## 2021-11-03 DIAGNOSIS — R600 Localized edema: Secondary | ICD-10-CM | POA: Diagnosis not present

## 2021-11-03 DIAGNOSIS — J449 Chronic obstructive pulmonary disease, unspecified: Secondary | ICD-10-CM | POA: Diagnosis not present

## 2021-11-03 DIAGNOSIS — K7400 Hepatic fibrosis, unspecified: Secondary | ICD-10-CM | POA: Diagnosis not present

## 2021-11-03 DIAGNOSIS — I4891 Unspecified atrial fibrillation: Secondary | ICD-10-CM | POA: Diagnosis not present

## 2021-11-03 DIAGNOSIS — Z9981 Dependence on supplemental oxygen: Secondary | ICD-10-CM | POA: Diagnosis not present

## 2021-11-03 DIAGNOSIS — Z7982 Long term (current) use of aspirin: Secondary | ICD-10-CM | POA: Diagnosis not present

## 2021-11-03 DIAGNOSIS — Z87891 Personal history of nicotine dependence: Secondary | ICD-10-CM | POA: Diagnosis not present

## 2021-11-03 DIAGNOSIS — K7581 Nonalcoholic steatohepatitis (NASH): Secondary | ICD-10-CM | POA: Diagnosis not present

## 2021-11-03 DIAGNOSIS — Z515 Encounter for palliative care: Secondary | ICD-10-CM | POA: Diagnosis not present

## 2021-11-10 ENCOUNTER — Telehealth: Payer: Self-pay | Admitting: Internal Medicine

## 2021-11-10 MED ORDER — BUDESONIDE-FORMOTEROL FUMARATE 160-4.5 MCG/ACT IN AERO: 2.0000 | INHALATION_SPRAY | Freq: Two times a day (BID) | RESPIRATORY_TRACT | 3 refills | Status: DC

## 2021-11-10 MED ORDER — SPIRIVA RESPIMAT 2.5 MCG/ACT IN AERS: 2.0000 | INHALATION_SPRAY | Freq: Every day | RESPIRATORY_TRACT | 3 refills | Status: DC

## 2021-11-10 MED ORDER — PROAIR HFA 108 (90 BASE) MCG/ACT IN AERS: 2.0000 | INHALATION_SPRAY | RESPIRATORY_TRACT | 12 refills | Status: DC | PRN

## 2021-11-10 NOTE — Telephone Encounter (Signed)
Call and spoke with patient. Patient verified medications that needed to be refilled and verified pharmacy. Refills have been sent in to patients pharmacy. Nothing further needed.  ?

## 2021-11-17 ENCOUNTER — Inpatient Hospital Stay (HOSPITAL_COMMUNITY): Payer: HMO | Attending: Hematology

## 2021-11-17 DIAGNOSIS — R918 Other nonspecific abnormal finding of lung field: Secondary | ICD-10-CM | POA: Insufficient documentation

## 2021-11-17 DIAGNOSIS — Z7984 Long term (current) use of oral hypoglycemic drugs: Secondary | ICD-10-CM | POA: Diagnosis not present

## 2021-11-17 DIAGNOSIS — Z7982 Long term (current) use of aspirin: Secondary | ICD-10-CM | POA: Diagnosis not present

## 2021-11-17 DIAGNOSIS — R61 Generalized hyperhidrosis: Secondary | ICD-10-CM | POA: Diagnosis not present

## 2021-11-17 DIAGNOSIS — Z87891 Personal history of nicotine dependence: Secondary | ICD-10-CM | POA: Insufficient documentation

## 2021-11-17 DIAGNOSIS — D696 Thrombocytopenia, unspecified: Secondary | ICD-10-CM | POA: Diagnosis not present

## 2021-11-17 DIAGNOSIS — Z7951 Long term (current) use of inhaled steroids: Secondary | ICD-10-CM | POA: Diagnosis not present

## 2021-11-17 DIAGNOSIS — E119 Type 2 diabetes mellitus without complications: Secondary | ICD-10-CM | POA: Insufficient documentation

## 2021-11-17 DIAGNOSIS — R5383 Other fatigue: Secondary | ICD-10-CM | POA: Diagnosis not present

## 2021-11-17 DIAGNOSIS — J449 Chronic obstructive pulmonary disease, unspecified: Secondary | ICD-10-CM | POA: Insufficient documentation

## 2021-11-17 DIAGNOSIS — Z79899 Other long term (current) drug therapy: Secondary | ICD-10-CM | POA: Insufficient documentation

## 2021-11-17 LAB — CBC WITH DIFFERENTIAL/PLATELET
Abs Immature Granulocytes: 0.04 10*3/uL (ref 0.00–0.07)
Basophils Absolute: 0.1 10*3/uL (ref 0.0–0.1)
Basophils Relative: 1 %
Eosinophils Absolute: 0.7 10*3/uL — ABNORMAL HIGH (ref 0.0–0.5)
Eosinophils Relative: 9 %
HCT: 49.1 % (ref 39.0–52.0)
Hemoglobin: 15.3 g/dL (ref 13.0–17.0)
Immature Granulocytes: 1 %
Lymphocytes Relative: 21 %
Lymphs Abs: 1.4 10*3/uL (ref 0.7–4.0)
MCH: 26.1 pg (ref 26.0–34.0)
MCHC: 31.2 g/dL (ref 30.0–36.0)
MCV: 83.6 fL (ref 80.0–100.0)
Monocytes Absolute: 0.5 10*3/uL (ref 0.1–1.0)
Monocytes Relative: 7 %
Neutro Abs: 4.2 10*3/uL (ref 1.7–7.7)
Neutrophils Relative %: 61 %
Platelets: 92 10*3/uL — ABNORMAL LOW (ref 150–400)
RBC: 5.87 MIL/uL — ABNORMAL HIGH (ref 4.22–5.81)
RDW: 14.9 % (ref 11.5–15.5)
WBC: 6.9 10*3/uL (ref 4.0–10.5)
nRBC: 0 % (ref 0.0–0.2)

## 2021-11-17 LAB — COMPREHENSIVE METABOLIC PANEL
ALT: 38 U/L (ref 0–44)
AST: 25 U/L (ref 15–41)
Albumin: 4.3 g/dL (ref 3.5–5.0)
Alkaline Phosphatase: 103 U/L (ref 38–126)
Anion gap: 7 (ref 5–15)
BUN: 20 mg/dL (ref 8–23)
CO2: 27 mmol/L (ref 22–32)
Calcium: 9.3 mg/dL (ref 8.9–10.3)
Chloride: 101 mmol/L (ref 98–111)
Creatinine, Ser: 0.88 mg/dL (ref 0.61–1.24)
GFR, Estimated: >60 mL/min (ref >60)
Glucose, Bld: 131 mg/dL — ABNORMAL HIGH (ref 70–99)
Potassium: 4.6 mmol/L (ref 3.5–5.1)
Sodium: 135 mmol/L (ref 135–145)
Total Bilirubin: 1 mg/dL (ref 0.3–1.2)
Total Protein: 8.7 g/dL — ABNORMAL HIGH (ref 6.5–8.1)

## 2021-11-17 LAB — VITAMIN B12: Vitamin B-12: 1269 pg/mL — ABNORMAL HIGH (ref 180–914)

## 2021-11-18 LAB — HOMOCYSTEINE: Homocysteine: 8.4 umol/L (ref 0.0–17.2)

## 2021-11-20 LAB — FOLATE: Folate: >40 ng/mL (ref >5.9)

## 2021-11-23 NOTE — Progress Notes (Signed)
[name], [place] [id number]   CLINIC:  Medical Oncology/Hematology  PCP:  Asencion Noble, MD [address] [place] [address] [phone]   REASON FOR VISIT:  Follow-up for thrombocytopenia  CURRENT THERAPY: Surveillance  INTERVAL HISTORY:  [name] 66 y.o. male returns for routine follow-up of his thrombocytopenia.  He was last seen by Tarri Abernethy PA-C on 05/24/2021.  At today's visit, he reports feeling fair.  No recent hospitalizations, surgeries, or changes in baseline health status.  He reports that he has scant episodes of epistaxis related to dry nasal cavity from submental oxygen via nasal cannula.  He denies any major episodes of epistaxis.  No other bleeding episodes such as hematuria, bleeding gums, hematemesis, hematochezia, or melena. He admits to easy bruising but denies any petechial rash. No nausea, vomiting, abdominal pain. He reports chronic fatigue, with energy about  40%, which he feels is slightly worse than usual.  He tires easily. He has occasional night sweats. He denies any B symptoms such as fever, chills, or unintentional weight loss. He is on chronic supplemental oxygen due to his underlying COPD.  He has 40% energy and 100% appetite. He endorses that he is maintaining a stable weight.   REVIEW OF SYSTEMS:  Review of Systems  Constitutional:  Positive for diaphoresis and fatigue. Negative for appetite change, chills, fever and unexpected weight change.  HENT:   Negative for lump/mass and nosebleeds.   Eyes:  Negative for eye problems.  Respiratory:  Positive for cough and shortness of breath. Negative for hemoptysis.   Cardiovascular:  Negative for chest pain, leg swelling and palpitations.  Gastrointestinal:  Negative for abdominal pain, blood in stool, constipation, diarrhea, nausea and vomiting.  Genitourinary:  Negative for hematuria.   Skin: Negative.   Neurological:  Positive for  numbness. Negative for dizziness, headaches and light-headedness.  Hematological:  Does not bruise/bleed easily.  Psychiatric/Behavioral:  Positive for sleep disturbance.      PAST MEDICAL/SURGICAL HISTORY:  Past Medical History:  Diagnosis Date   Anxiety    Asthma    Chest pain    a. 2003 Cath: nl cors.   COPD (chronic obstructive pulmonary disease) ([place])    chronically on 3-4L [place]   Depression    Diabetes mellitus    Dyslipidemia    Dysrhythmia    Fracture 06/2014   lower back "L-1"   GERD (gastroesophageal reflux disease)    Hypertension    NASH (nonalcoholic steatohepatitis) 2018   [name], undergoing routine cirrhosis care; [name]   PONV (postoperative nausea and vomiting)    Splenomegaly    Thrombocytopenia ([place])    Past Surgical History:  Procedure Laterality Date   BIOPSY  07/26/2016   Procedure: BIOPSY;  Surgeon: [name], MD;  [facility];  Service: Endoscopy;;  gastric   CARDIAC CATHETERIZATION  10/22/2001   normal coronary arteriea (Dr. Domenic Moras)   COLONOSCOPY  08/31/2011   ION:[id number] rectal and colon polyps-treated. Single tubular adenoma. Next TCS 08/2016   COLONOSCOPY N/A 07/26/2016   Dr. [name]: diverticulosis in sigmoid and descending colon. 6 mm benign splenic flexure. surveillance 5 yeras   COLONOSCOPY WITH PROPOFOL N/A 08/07/2021   Procedure: COLONOSCOPY WITH PROPOFOL;  Surgeon: Eloise Harman, DO;  [facility];  Service: Endoscopy;  Laterality: N/A;  10:30am   ESOPHAGOGASTRODUODENOSCOPY N/A 07/26/2016   Dr. [name]: normal esophagus, portal gastropathy, chronic gastritis,  normal duodenum, screening EGD 2 years    ESOPHAGOGASTRODUODENOSCOPY (EGD) WITH ESOPHAGEAL DILATION N/A 05/15/2013   ERX:[id number] esophagus-status post Venia Minks dilation/Portal gastropathy. Antral erosions-status post biopsy. extrinsic compression along lesser curvature likely secondary to splenomegaly. gastric  bx benign   ESOPHAGOGASTRODUODENOSCOPY (EGD) WITH PROPOFOL N/A 12/12/2020   Surgeon: [name], MD; ormal esophagus s/p dilation, portal hypertensive gastropathy.  Due for repeat in 2024.   HERNIA REPAIR  03/2009   MALONEY DILATION N/A 12/12/2020   Procedure: Venia Minks DILATION;  Surgeon: [name], MD;  [facility];  Service: Endoscopy;  Laterality: N/A;   POLYPECTOMY  07/26/2016   Procedure: POLYPECTOMY;  Surgeon: [name], MD;  [facility];  Service: Endoscopy;;  colon   POLYPECTOMY  08/07/2021   Procedure: POLYPECTOMY;  Surgeon: Eloise Harman, DO;  [facility];  Service: Endoscopy;;   TRANSTHORACIC ECHOCARDIOGRAM  2011   EF 76-19%, stage 1 diastolic dysfunction, trace TR & pulm valve regurg    VASECTOMY  1989     SOCIAL HISTORY:  Social History   Socioeconomic History   Marital status: Married    Spouse name: Not on file   Number of children: Not on file   Years of education: Not on file   Highest education level: Not on file  Occupational History   Not on file  Tobacco Use   Smoking status: Former    Packs/day: 1.25    Years: 36.00    Pack years: 45.00    Types: Cigarettes    Quit date: 04/2011    Years since quitting: 10.6   Smokeless tobacco: Former  Scientific laboratory technician Use: Never used  Substance and Sexual Activity   Alcohol use: No   Drug use: No   Sexual activity: Not on file  Other Topics Concern   Not on file  Social History Narrative   Not on file   Social Determinants of Health   Financial Resource Strain: Not on file  Food Insecurity: Not on file  Transportation Needs: Not on file  Physical Activity: Not on file  Stress: Not on file  Social Connections: Not on file  Intimate Partner Violence: Not on file    FAMILY HISTORY:  Family History  Problem Relation Age of Onset   Heart disease Mother    Cancer Mother        lymph nodes   Diabetes Mother    Arthritis Other    Lung disease  Other    Asthma Other    Kidney disease Other    Ovarian cancer Sister    Diabetes Brother    Heart disease Brother    Hypertension Brother    Diabetes Sister    Diabetes Sister    Cirrhosis Sister 76       no etoh   Colon cancer Neg Hx     CURRENT MEDICATIONS:  Outpatient Encounter Medications as of 11/24/2021  Medication Sig   acetaminophen (TYLENOL) 500 MG tablet Take 1,000 mg by mouth every 6 (six) hours as needed for headache or moderate pain.   albuterol (PROVENTIL) (2.5 MG/3ML) 0.083% nebulizer solution Take 3 mLs (2.5 mg total) by nebulization every 2 (two) hours as needed for wheezing or shortness of breath (cough). (Patient not taking: Reported on 11/23/2021)   ALPRAZolam (XANAX) 0.25 MG tablet Take 0.25 mg by mouth 2 (two) times daily as needed for sleep or anxiety.   aspirin EC 81 MG tablet Take 1 tablet (  81 mg total) by mouth daily with breakfast.   atorvastatin (LIPITOR) 40 MG tablet Take 40 mg by mouth daily.   Blood Glucose Monitoring Suppl (ONETOUCH VERIO) w/Device KIT 2 (two) times daily.   budesonide-formoterol (SYMBICORT) 160-4.5 MCG/ACT inhaler Inhale 2 puffs into the lungs 2 (two) times daily.   Cholecalciferol (VITAMIN D3) 25 MCG (1000 UT) CAPS Take 1,000 Units by mouth daily.   [employer] VITAMIN C 250 MG tablet Take 250 mg by mouth daily.   diltiazem (CARDIZEM CD) 240 MG 24 hr capsule Take 240 mg by mouth daily.   fluticasone (FLONASE) 50 MCG/ACT nasal spray Place 1 spray into both nostrils daily as needed for allergies or rhinitis.   gabapentin (NEURONTIN) 300 MG capsule Take 1 capsule (300 mg total) by mouth at bedtime.   glimepiride (AMARYL) 1 MG tablet Take 1 mg by mouth daily with breakfast.   guaiFENesin (MUCINEX) 600 MG 12 hr tablet Take 1 tablet (600 mg total) by mouth 2 (two) times daily. (Patient not taking: Reported on 11/23/2021)   guaiFENesin-dextromethorphan (ROBITUSSIN DM) 100-10 MG/5ML syrup Take 5 mLs by mouth every 4 (four) hours as needed for cough  (chest congestion). (Patient not taking: Reported on 11/23/2021)   JARDIANCE 25 MG TABS tablet Take 25 mg by mouth every evening.    metFORMIN (GLUCOPHAGE) 1000 MG tablet Take 1,000 mg by mouth daily with supper.   Multiple Vitamin (MULTIVITAMIN WITH MINERALS) TABS tablet Take 1 tablet by mouth at bedtime.   ONETOUCH DELICA LANCETS 72C MISC    ONETOUCH VERIO test strip    OXYGEN Inhale 3 L into the lungs continuous.   pantoprazole (PROTONIX) 40 MG tablet TAKE 1 TABLET BY MOUTH EVERY DAY   PROAIR HFA 108 (90 Base) MCG/ACT inhaler Inhale 2 puffs into the lungs every 4 (four) hours as needed for wheezing or shortness of breath.   sertraline (ZOLOFT) 50 MG tablet Take 1 tablet (50 mg total) by mouth daily.   SitaGLIPtin-MetFORMIN HCl [phone] MG TB24 Take 1 tablet by mouth daily.   sodium chloride (OCEAN) 0.65 % SOLN nasal spray Place 1 spray into both nostrils as needed for congestion. (Patient not taking: Reported on 11/23/2021)   tamsulosin (FLOMAX) 0.4 MG CAPS capsule Take 0.4 mg by mouth daily.   thiamine 100 MG tablet Take 1 tablet (100 mg total) by mouth daily.   Tiotropium Bromide Monohydrate (SPIRIVA RESPIMAT) 2.5 MCG/ACT AERS Inhale 2 puffs into the lungs daily.   No facility-administered encounter medications on file as of 11/24/2021.    ALLERGIES:  Allergies  Allergen Reactions   Baclofen Other (See Comments)    lethargic    Simvastatin Nausea Only     PHYSICAL EXAM:  ECOG PERFORMANCE STATUS: 2 - Symptomatic, <50% confined to bed  There were no vitals filed for this visit. There were no vitals filed for this visit. Physical Exam Constitutional:      Appearance: Normal appearance.     Comments: Nasal cannula in place, on 3 L submental oxygen  HENT:     Head: Normocephalic and atraumatic.     Mouth/Throat:     Mouth: Mucous membranes are moist.  Eyes:     Extraocular Movements: Extraocular movements intact.     Pupils: Pupils are equal, round, and reactive to light.   Cardiovascular:     Rate and Rhythm: Normal rate and regular rhythm.     Pulses: Normal pulses.     Heart sounds: Normal heart sounds.  Pulmonary:  Effort: Pulmonary effort is normal.     Comments: Decreased breath sounds in posterior lung fields, poor air movement.  Abdominal:     General: Bowel sounds are normal.     Palpations: Abdomen is soft.     Tenderness: There is no abdominal tenderness.  Musculoskeletal:        General: No swelling.     Right lower leg: No edema.     Left lower leg: No edema.  Lymphadenopathy:     Cervical: No cervical adenopathy.  Skin:    General: Skin is warm and dry.     Findings: Bruising present.  Neurological:     General: No focal deficit present.     Mental Status: He is alert and oriented to person, place, and time.  Psychiatric:        Mood and Affect: Mood normal.        Behavior: Behavior normal.     LABORATORY DATA:  I have reviewed the labs as listed.  CBC    Component Value Date/Time   WBC 6.9 11/17/2021 0840   RBC 5.87 (H) 11/17/2021 0840   HGB 15.3 11/17/2021 0840   HGB 15.6 07/31/2021 0747   HCT 49.1 11/17/2021 0840   HCT 48.3 07/31/2021 0747   PLT 92 (L) 11/17/2021 0840   PLT 100 (LL) 07/31/2021 0747   MCV 83.6 11/17/2021 0840   MCV 82 07/31/2021 0747   MCH 26.1 11/17/2021 0840   MCHC 31.2 11/17/2021 0840   RDW 14.9 11/17/2021 0840   RDW 15.7 (H) 07/31/2021 0747   LYMPHSABS 1.4 11/17/2021 0840   LYMPHSABS 1.0 07/31/2021 0747   MONOABS 0.5 11/17/2021 0840   EOSABS 0.7 (H) 11/17/2021 0840   EOSABS 0.2 07/31/2021 0747   BASOSABS 0.1 11/17/2021 0840   BASOSABS 0.0 07/31/2021 0747      Latest Ref Rng & Units 11/17/2021    8:40 AM 07/31/2021    7:47 AM 06/06/2021    9:00 AM  CMP  Glucose 70 - 99 mg/dL 131   157   134    BUN 8 - 23 mg/dL 20   12   11     Creatinine 0.61 - 1.24 mg/dL 0.88   0.88   0.92    Sodium 135 - 145 mmol/L 135   144   139    Potassium 3.5 - 5.1 mmol/L 4.6   4.3   4.2    Chloride 98 - 111  mmol/L 101   103   97    CO2 22 - 32 mmol/L 27   27   27     Calcium 8.9 - 10.3 mg/dL 9.3   10.0   9.7    Total Protein 6.5 - 8.1 g/dL 8.7   7.9   7.8    Total Bilirubin 0.3 - 1.2 mg/dL 1.0   0.6   1.4    Alkaline Phos 38 - 126 U/L 103   95   140    AST 15 - 41 U/L 25   19   25     ALT 0 - 44 U/L 38   32   28      DIAGNOSTIC IMAGING:  I have independently reviewed the relevant imaging and discussed with the patient.  ASSESSMENT & PLAN: Mild to moderate thrombocytopenia: - He has thrombocytopenia since 2010.  White count and hemoglobin are normal. - Normal B12 and folate.  SPEP negative for M spike. - PET scan on 08/26/2017 for follow-up lung nodule showed  fatty liver with normal spleen. - Liver biopsy on 02/21/2017 showed moderate to severe steatosis. - US abdomen RUQ from 01/20/2021 showed echogenic liver consistent with steatosis. No focal lesions or acute findings. Previously, imaging from 07/19/20 showed splenomegaly with spleen measuring 15.2 cm with volume of 702.7cc. - US abdomen (07/25/2021): Craniocaudal 11 cm in length - Blood smear previously noted normal appearing platelets.  - Mild bruising which is likely secondary to combination of aspirin and thrombocytopenia.  Scant epistaxis from nasal cannula.  He has not had spontaneous bleeding of gums, nose, or petechia or purpura.   - Most recent labs (11/17/2021): Platelets 92, otherwise normal CBC.  CMP essentially unremarkable.  Normal B12 and folate. - Differential diagnosis of of thrombocytopenia includes liver disease versus chronic ITP versus MDS - PLAN: Platelets remain essentially stable at baseline.  No indication for treatment of thrombocytopenia at this time.  If counts worsen from baseline, would recommend bone marrow biopsy. - We will consider additional treatment if platelets are persistently less than 30. - Repeat labs and RTC in 6 months    2.  Lung nodules with history of tobacco use - Patient smoked 30 years, at least  1-1/2 packs/day.  He quit in October 2012. - CT low-dose on 02/03/2020 was lung RADS 2.   - Patient had CT angio while hospitalized on 04/17/2020 which showed interval improvement of groundglass nodularity in the right middle lobe. - CT chest low-dose (06/28/2021): Clustered nodular opacities of the bilateral lower lungs, overall decreased in size and number when compared with prior exam.  Lung RADS 2, benign appearance or behavior. - PLAN: Continue annual LDCT as long as he is eligible (until October 2027).  3.  Fatigue - Worsening fatigue and night sweats - PLAN: We will check testosterone, TSH, iron levels     PLAN SUMMARY & DISPOSITION: Labs TODAY (testosterone, TSH, iron and ferritin) Labs in 6 months (CBC, CMP, LDH) RTC in 6 months, 1 week after labs  All questions were answered. The patient knows to call the clinic with any problems, questions or concerns.  Medical decision making: Moderate  Time spent on visit: I spent 20 minutes counseling the patient face to face. The total time spent in the appointment was 30 minutes and more than 50% was on counseling.   Harriett Rush, PA-C  11/24/2021 8:43 AM

## 2021-11-24 ENCOUNTER — Encounter (HOSPITAL_COMMUNITY): Payer: Self-pay

## 2021-11-24 ENCOUNTER — Inpatient Hospital Stay (HOSPITAL_COMMUNITY): Payer: HMO | Admitting: Physician Assistant

## 2021-11-24 ENCOUNTER — Inpatient Hospital Stay (HOSPITAL_COMMUNITY): Payer: HMO

## 2021-11-24 ENCOUNTER — Other Ambulatory Visit (HOSPITAL_COMMUNITY): Payer: Self-pay | Admitting: Physician Assistant

## 2021-11-24 VITALS — BP 116/67 | HR 91 | Temp 98.0°F | Resp 19 | Ht 68.0 in | Wt 170.0 lb

## 2021-11-24 DIAGNOSIS — R5383 Other fatigue: Secondary | ICD-10-CM | POA: Diagnosis not present

## 2021-11-24 DIAGNOSIS — D696 Thrombocytopenia, unspecified: Secondary | ICD-10-CM | POA: Diagnosis not present

## 2021-11-24 DIAGNOSIS — E611 Iron deficiency: Secondary | ICD-10-CM

## 2021-11-24 LAB — IRON AND TIBC
Iron: 61 ug/dL (ref 45–182)
Saturation Ratios: 14 % — ABNORMAL LOW (ref 17.9–39.5)
TIBC: 423 ug/dL (ref 250–450)
UIBC: 362 ug/dL

## 2021-11-24 LAB — FERRITIN: Ferritin: 25 ng/mL (ref 24–336)

## 2021-11-24 LAB — TSH: TSH: 4.199 u[IU]/mL (ref 0.350–4.500)

## 2021-11-24 NOTE — Patient Instructions (Signed)
[facility] Discharge Instructions  You were seen today by Tarri Abernethy PA-C for your low platelets, which is most likely related to your fatty liver disease.  For the time being, your platelets are only mildly low and are remaining stable within the same range.  You do not need any treatment for your low platelets at this time, but you may notice that you bruise easier than you used to.  Please let us know if you have any major bleeding events such as severe nosebleeds, blood in your urine, or blood in your bowel movements.  We will check labs today to look for causes of your fatigue.  We will call you with these results so that you can follow up with your primary care doctor.  LABS: Return in 6 months for repeat labs  MEDICATIONS: No changes to home medications  FOLLOW-UP APPOINTMENT: Office visit in 6 months, after labs   Thank you for choosing [facility] to provide your oncology and hematology care.  To afford each patient quality time with our provider, please arrive at least 15 minutes before your scheduled appointment time.   If you have a lab appointment with the [facility] please come in thru the Main Entrance and check in at the main information desk.  You need to re-schedule your appointment should you arrive 10 or more minutes late.  We strive to give you quality time with our providers, and arriving late affects you and other patients whose appointments are after yours.  Also, if you no show three or more times for appointments you may be dismissed from the clinic at the providers discretion.     Again, thank you for choosing [facility].  Our hope is that these requests will decrease the amount of time that you wait before being seen by our physicians.       _____________________________________________________________  Should you have questions after your visit to [facility], please contact our office at [phone] and follow the prompts.  Our office hours are 8:00 a.m. and 4:30 p.m. Monday - Friday.  Please note that voicemails left after 4:00 p.m. may not be returned until the following business day.  We are closed weekends and major holidays.  You do have access to a nurse 24-7, just call the main number to the clinic [phone] and do not press any options, hold on the line and a nurse will answer the phone.    For prescription refill requests, have your pharmacy contact our office and allow 72 hours.    Due to Covid, you will need to wear a mask upon entering the hospital. If you do not have a mask, a mask will be given to you at the Main Entrance upon arrival. For doctor visits, patients may have 1 support person age 52 or older with them. For treatment visits, patients can not have anyone with them due to social distancing guidelines and our immunocompromised population.

## 2021-11-24 NOTE — Progress Notes (Signed)
Surgical Instructions    Your procedure is scheduled on 12/01/21.  Report to [facility] Main Entrance "A" at 11:30 A.M., then check in with the Admitting office.  Call this number if you have problems the morning of surgery:  [phone]   If you have any questions prior to your surgery date call [phone]: Open Monday-Friday 8am-4pm    Remember:  Do not eat or drink after midnight the night before your surgery     Take these medicines the morning of surgery with A SIP OF WATER:  atorvastatin (LIPITOR) budesonide-formoterol (SYMBICORT)  diltiazem (CARDIZEM CD)  guaiFENesin (MUCINEX)  pantoprazole (PROTONIX)  PROAIR inhaler- bring with you the day of surgery sertraline (ZOLOFT)  tamsulosin (FLOMAX)  Tiotropium Bromide Monohydrate (SPIRIVA RESPIMAT)  IF NEEDED: acetaminophen (TYLENOL)  albuterol (PROVENTIL) (2.5 MG/3ML) nebulizer ALPRAZolam (XANAX)  fluticasone (FLONASE) guaiFENesin-dextromethorphan (ROBITUSSIN DM) sodium chloride (OCEAN) nasal spray  As of today, STOP taking any Aspirin (unless otherwise instructed by your surgeon) Aleve, Naproxen, Ibuprofen, Motrin, Advil, Goody's, BC's, all herbal medications, fish oil, and all vitamins.   WHAT DO I DO ABOUT MY DIABETES MEDICATION?   Do not take oral diabetes medicines (pills) the morning of surgery.  THE DAY BEFORE SURGERY, do not take [place].   THE MORNING OF SURGERY, do not take glimepiride (AMARYL), JARDIANCE, metFORMIN (GLUCOPHAGE) or SitaGLIPtin-MetFORMIN HCl   The day of surgery, do not take other diabetes injectables, including Byetta (exenatide), Bydureon (exenatide ER), Victoza (liraglutide), or Trulicity (dulaglutide).  If your CBG is greater than 220 mg/dL, you may take  of your sliding scale (correction) dose of insulin.   HOW TO MANAGE YOUR DIABETES BEFORE AND AFTER SURGERY  Why is it important to control my blood sugar before and after surgery? Improving blood sugar levels before and after  surgery helps healing and can limit problems. A way of improving blood sugar control is eating a healthy diet by:  Eating less sugar and carbohydrates  Increasing activity/exercise  Talking with your doctor about reaching your blood sugar goals High blood sugars (greater than 180 mg/dL) can raise your risk of infections and slow your recovery, so you will need to focus on controlling your diabetes during the weeks before surgery. Make sure that the doctor who takes care of your diabetes knows about your planned surgery including the date and location.  How do I manage my blood sugar before surgery? Check your blood sugar at least 4 times a day, starting 2 days before surgery, to make sure that the level is not too high or low.  Check your blood sugar the morning of your surgery when you wake up and every 2 hours until you get to the Short Stay unit.  If your blood sugar is less than 70 mg/dL, you will need to treat for low blood sugar: Do not take insulin. Treat a low blood sugar (less than 70 mg/dL) with  cup of clear juice (cranberry or apple), 4 glucose tablets, OR glucose gel. Recheck blood sugar in 15 minutes after treatment (to make sure it is greater than 70 mg/dL). If your blood sugar is not greater than 70 mg/dL on recheck, call [phone] for further instructions. Report your blood sugar to the short stay nurse when you get to Short Stay.  If you are admitted to the hospital after surgery: Your blood sugar will be checked by the staff and you will probably be given insulin after surgery (instead of oral diabetes medicines) to make sure you have good blood  sugar levels. The goal for blood sugar control after surgery is 80-180 mg/dL.        Do not wear jewelry or makeup Do not wear lotions, powders, perfumes/colognes, or deodorant. Men may shave face and neck. Do not bring valuables to the hospital. Do not wear nail polish, gel polish, artificial nails, or any other type of  covering on natural nails (fingers and toes) If you have artificial nails or gel coating that need to be removed by a nail salon, please have this removed prior to surgery. Artificial nails or gel coating may interfere with anesthesia's ability to adequately monitor your vital signs.  [facility] is not responsible for any belongings or valuables. .   Do NOT Smoke (Tobacco/Vaping)  24 hours prior to your procedure  If you use a CPAP at night, you may bring your mask for your overnight stay.   Contacts, glasses, hearing aids, dentures or partials may not be worn into surgery, please bring cases for these belongings   For patients admitted to the hospital, discharge time will be determined by your treatment team.   Patients discharged the day of surgery will not be allowed to drive home, and someone needs to stay with them for 24 hours.   SURGICAL WAITING ROOM VISITATION Patients having surgery or a procedure in a hospital may have two support people. Children under the age of 63 must have an adult with them who is not the patient. They may stay in the waiting area during the procedure and may switch out with other visitors. If the patient needs to stay at the hospital during part of their recovery, the visitor guidelines for inpatient rooms apply.  Please refer to the [facility] website for the visitor guidelines for Inpatients (after your surgery is over and you are in a regular room).       Special instructions:    Oral Hygiene is also important to reduce your risk of infection.  Remember - BRUSH YOUR TEETH THE MORNING OF SURGERY WITH YOUR REGULAR TOOTHPASTE   [facility]- Preparing For Surgery  Before surgery, you can play an important role. Because skin is not sterile, your skin needs to be as free of germs as possible. You can reduce the number of germs on your skin by washing with CHG (chlorahexidine gluconate) Soap before surgery.  CHG is an antiseptic cleaner which kills  germs and bonds with the skin to continue killing germs even after washing.     Please do not use if you have an allergy to CHG or antibacterial soaps. If your skin becomes reddened/irritated stop using the CHG.  Do not shave (including legs and underarms) for at least 48 hours prior to first CHG shower. It is OK to shave your face.  Please follow these instructions carefully.     Shower the NIGHT BEFORE SURGERY and the MORNING OF SURGERY with CHG Soap.   If you chose to wash your hair, wash your hair first as usual with your normal shampoo. After you shampoo, rinse your hair and body thoroughly to remove the shampoo.  Then [employer] and genitals (private parts) with your normal soap and rinse thoroughly to remove soap.  After that Use CHG Soap as you would any other liquid soap. You can apply CHG directly to the skin and wash gently with a scrungie or a clean washcloth.   Apply the CHG Soap to your body ONLY FROM THE NECK DOWN.  Do not use on open wounds  or open sores. Avoid contact with your eyes, ears, mouth and genitals (private parts). Wash Face and genitals (private parts)  with your normal soap.   Wash thoroughly, paying special attention to the area where your surgery will be performed.  Thoroughly rinse your body with warm water from the neck down.  DO NOT shower/wash with your normal soap after using and rinsing off the CHG Soap.  Pat yourself dry with a CLEAN TOWEL.  Wear CLEAN PAJAMAS to bed the night before surgery  Place CLEAN SHEETS on your bed the night before your surgery  DO NOT SLEEP WITH PETS.   Day of Surgery: Take a shower with CHG soap. Wear Clean/Comfortable clothing the morning of surgery Do not apply any deodorants/lotions.   Remember to brush your teeth WITH YOUR REGULAR TOOTHPASTE.    If you received a COVID test during your pre-op visit, it is requested that you wear a mask when out in public, stay away from anyone that may not be feeling well, and  notify your surgeon if you develop symptoms. If you have been in contact with anyone that has tested positive in the last 10 days, please notify your surgeon.    Please read over the following fact sheets that you were given.

## 2021-11-25 LAB — TESTOSTERONE: Testosterone: 263 ng/dL — ABNORMAL LOW (ref 264–916)

## 2021-11-26 LAB — METHYLMALONIC ACID, SERUM: Methylmalonic Acid, Quantitative: 161 nmol/L (ref 0–378)

## 2021-11-27 ENCOUNTER — Encounter (HOSPITAL_COMMUNITY): Payer: Self-pay

## 2021-11-27 ENCOUNTER — Other Ambulatory Visit (HOSPITAL_COMMUNITY): Payer: HMO

## 2021-11-27 ENCOUNTER — Other Ambulatory Visit: Payer: Self-pay

## 2021-11-27 ENCOUNTER — Encounter (HOSPITAL_COMMUNITY)
Admission: RE | Admit: 2021-11-27 | Discharge: 2021-11-27 | Disposition: A | Discharge status: Home / Self Care | Payer: HMO | Source: Ambulatory Visit | Attending: Otolaryngology | Admitting: Otolaryngology

## 2021-11-27 VITALS — BP 113/71 | HR 89 | Temp 97.7°F | Resp 18 | Ht 67.0 in | Wt 169.0 lb

## 2021-11-27 DIAGNOSIS — D696 Thrombocytopenia, unspecified: Secondary | ICD-10-CM | POA: Insufficient documentation

## 2021-11-27 DIAGNOSIS — J449 Chronic obstructive pulmonary disease, unspecified: Secondary | ICD-10-CM | POA: Diagnosis not present

## 2021-11-27 DIAGNOSIS — I10 Essential (primary) hypertension: Secondary | ICD-10-CM | POA: Diagnosis not present

## 2021-11-27 DIAGNOSIS — J9610 Chronic respiratory failure, unspecified whether with hypoxia or hypercapnia: Secondary | ICD-10-CM | POA: Insufficient documentation

## 2021-11-27 DIAGNOSIS — E119 Type 2 diabetes mellitus without complications: Secondary | ICD-10-CM

## 2021-11-27 DIAGNOSIS — Z01818 Encounter for other preprocedural examination: Secondary | ICD-10-CM | POA: Insufficient documentation

## 2021-11-27 HISTORY — DX: Pneumonia, unspecified organism: J18.9

## 2021-11-27 HISTORY — DX: Unspecified osteoarthritis, unspecified site: M19.90

## 2021-11-27 HISTORY — DX: Concussion with loss of consciousness status unknown, initial encounter: S06.0XAA

## 2021-11-27 LAB — HEMOGLOBIN A1C
Hgb A1c MFr Bld: 6.8 % — ABNORMAL HIGH (ref 4.8–5.6)
Mean Plasma Glucose: 148.46 mg/dL

## 2021-11-27 LAB — GLUCOSE, CAPILLARY: Glucose-Capillary: 150 mg/dL — ABNORMAL HIGH (ref 70–99)

## 2021-11-27 NOTE — Progress Notes (Addendum)
PCP - Asencion Noble Cardiologist - Denies Pulmonologist Dr. Melvyn Novas  PPM/ICD - Denies  Chest x-ray - Not indicated EKG - 11/27/21 Stress Test - Denies ECHO - 12/27/17 Cardiac Cath - 10/22/01  Sleep Study - Yes no OSA 3L O2 dependent  DM - Type II CBG at PAT appt 150 Fasting Blood Sugar - CBG averages 113-153 Checks Blood Sugar __2-3___ times a day  Aspirin Instructions: Per patient instructed to stop on Thursday 11/23/21 was last dose  ERAS Protcol -No  Anesthesia review: Yes cardiac history and O2 dependent  Patient denies shortness of breath, fever, cough and chest pain at PAT appointment   All instructions explained to the patient, with a verbal understanding of the material. Patient agrees to go over the instructions while at home for a better understanding. The opportunity to ask questions was provided.

## 2021-11-27 NOTE — Progress Notes (Signed)
RN POOL:  Please call to let [name] know that his testosterone levels are slightly low. This may be part of what is making him fatigued (along with his low iron).  He should discuss his testosterone levels with his primary care doctor for further testing and possible treatment.

## 2021-11-27 NOTE — Progress Notes (Signed)
Patient called.  Unable to reach patient.

## 2021-11-28 NOTE — Progress Notes (Signed)
Per Tarri Abernethy PA, patient was prescribed Iron supplement 325 mg daily and take the supplement with a glass of orange juice for absorption. Called patient and explained to patient and patient understood.

## 2021-11-28 NOTE — Anesthesia Preprocedure Evaluation (Addendum)
Anesthesia Evaluation  Patient identified by MRN, date of birth, ID band Patient awake    Reviewed: Allergy & Precautions, NPO status , Patient's Chart, lab work & pertinent test results  History of Anesthesia Complications (+) PONV and history of anesthetic complications  Airway Mallampati: IV  TM Distance: >3 FB Neck ROM: Full    Dental  (+) Edentulous Upper, Missing, Dental Advisory Given   Pulmonary asthma (rescue inhaler once a week, took all others inhalers this AM) , COPD (3-4LPM ),  COPD inhaler and oxygen dependent, former smoker,  Former smoker, quit 2012, 45 pack year history    Pulmonary exam normal breath sounds clear to auscultation       Cardiovascular hypertension (118/71 in preop), Pt. on medications + DOE  Normal cardiovascular exam Rhythm:Regular Rate:Normal  TTE 12/27/2017: - Left ventricle: The cavity size was normal. Wall thickness was  normal. Systolic function was normal. The estimated ejection  fraction was in the range of 55% to 60%. Indeterminate diastolic  function. Wall motion was normal; there were no regional wall  motion abnormalities.  - Atrial septum: No defect or patent foramen ovale was identified.     Neuro/Psych PSYCHIATRIC DISORDERS Anxiety Depression negative neurological ROS     GI/Hepatic GERD  Medicated and Controlled,(+) Cirrhosis       , Hepatitis - (NASH)Plt 115 today    Endo/Other  diabetes, Well Controlled, Type 2, Oral Hypoglycemic Agentsa1c 6.8 FS 114  Renal/GU negative Renal ROS  negative genitourinary   Musculoskeletal  (+) Arthritis , Osteoarthritis,    Abdominal   Peds  Hematology  (+) Blood dyscrasia, , Chronic thrombocytopenia- last plt 96 in July 2023, 115 today   Anesthesia Other Findings   Reproductive/Obstetrics negative OB ROS                          Anesthesia Physical Anesthesia Plan  ASA: 4  Anesthesia  Plan: General   Post-op Pain Management: Tylenol PO (pre-op)*   Induction: Intravenous  PONV Risk Score and Plan: 4 or greater and Ondansetron, Dexamethasone, Midazolam and Treatment may vary due to age or medical condition  Airway Management Planned: Oral ETT  Additional Equipment: None  Intra-op Plan:   Post-operative Plan: Extubation in OR  Informed Consent: I have reviewed the patients History and Physical, chart, labs and discussed the procedure including the risks, benefits and alternatives for the proposed anesthesia with the patient or authorized representative who has indicated his/her understanding and acceptance.     Dental advisory given  Plan Discussed with: CRNA  Anesthesia Plan Comments: (  )       Anesthesia Quick Evaluation

## 2021-11-28 NOTE — Progress Notes (Signed)
Anesthesia Chart Review:  Follows with pulmonology for hx of COPD GOLD 3, chronic respiratory failure with oxygen.  He is maintained on Symbicort, Spiriva, supplemental O2.  Uses 3 L nightly and 2 L during the day (he has been instructed to titrate during the day to maintain sats greater than 90%).  Last seen by Dr. Melvyn Novas 09/01/2021, stable at that time, no changes to management, recommended follow-up in 12 months.  Follows with hematology for history of mild to moderate thrombocytopenia since 2010.  Non-insulin-dependent DM2, well controlled, A1c 6.8 on preop labs.  CMP and CBC from 11/17/2021 reviewed, platelets mildly low at 92 (consistent with history of thrombocytopenia), otherwise unremarkable.  EKG 11/27/2021: NSR.  Rate 82.  TTE 12/27/2017: - Left ventricle: The cavity size was normal. Wall thickness was    normal. Systolic function was normal. The estimated ejection    fraction was in the range of 55% to 60%. Indeterminate diastolic    function. Wall motion was normal; there were no regional wall    motion abnormalities.  - Atrial septum: No defect or patent foramen ovale was identified.     [name] [facility] Short Stay Center/Anesthesiology Phone [phone] 11/28/2021 11:49 AM

## 2021-11-30 ENCOUNTER — Emergency Department (HOSPITAL_COMMUNITY): Payer: HMO

## 2021-11-30 ENCOUNTER — Emergency Department (HOSPITAL_COMMUNITY)
Admission: EM | Admit: 2021-11-30 | Discharge: 2021-11-30 | Disposition: A | Discharge status: Home / Self Care | Payer: HMO | Attending: Emergency Medicine | Admitting: Emergency Medicine

## 2021-11-30 ENCOUNTER — Encounter (HOSPITAL_COMMUNITY): Payer: Self-pay | Admitting: Emergency Medicine

## 2021-11-30 ENCOUNTER — Other Ambulatory Visit: Payer: Self-pay

## 2021-11-30 DIAGNOSIS — J449 Chronic obstructive pulmonary disease, unspecified: Secondary | ICD-10-CM | POA: Diagnosis not present

## 2021-11-30 DIAGNOSIS — Z7982 Long term (current) use of aspirin: Secondary | ICD-10-CM | POA: Insufficient documentation

## 2021-11-30 DIAGNOSIS — Z20822 Contact with and (suspected) exposure to covid-19: Secondary | ICD-10-CM | POA: Insufficient documentation

## 2021-11-30 DIAGNOSIS — J189 Pneumonia, unspecified organism: Secondary | ICD-10-CM

## 2021-11-30 DIAGNOSIS — Z7984 Long term (current) use of oral hypoglycemic drugs: Secondary | ICD-10-CM | POA: Insufficient documentation

## 2021-11-30 DIAGNOSIS — D696 Thrombocytopenia, unspecified: Secondary | ICD-10-CM | POA: Insufficient documentation

## 2021-11-30 DIAGNOSIS — I10 Essential (primary) hypertension: Secondary | ICD-10-CM | POA: Insufficient documentation

## 2021-11-30 DIAGNOSIS — Z79899 Other long term (current) drug therapy: Secondary | ICD-10-CM | POA: Insufficient documentation

## 2021-11-30 DIAGNOSIS — R824 Acetonuria: Secondary | ICD-10-CM | POA: Insufficient documentation

## 2021-11-30 DIAGNOSIS — R42 Dizziness and giddiness: Secondary | ICD-10-CM | POA: Insufficient documentation

## 2021-11-30 DIAGNOSIS — E1165 Type 2 diabetes mellitus with hyperglycemia: Secondary | ICD-10-CM | POA: Diagnosis not present

## 2021-11-30 DIAGNOSIS — R531 Weakness: Secondary | ICD-10-CM | POA: Diagnosis not present

## 2021-11-30 DIAGNOSIS — J181 Lobar pneumonia, unspecified organism: Secondary | ICD-10-CM | POA: Insufficient documentation

## 2021-11-30 DIAGNOSIS — J439 Emphysema, unspecified: Secondary | ICD-10-CM | POA: Diagnosis not present

## 2021-11-30 DIAGNOSIS — R0602 Shortness of breath: Secondary | ICD-10-CM | POA: Diagnosis not present

## 2021-11-30 DIAGNOSIS — R059 Cough, unspecified: Secondary | ICD-10-CM | POA: Diagnosis not present

## 2021-11-30 LAB — CBC WITH DIFFERENTIAL/PLATELET
Abs Immature Granulocytes: 0.04 10*3/uL (ref 0.00–0.07)
Basophils Absolute: 0 10*3/uL (ref 0.0–0.1)
Basophils Relative: 0 %
Eosinophils Absolute: 0 10*3/uL (ref 0.0–0.5)
Eosinophils Relative: 0 %
HCT: 52.1 % — ABNORMAL HIGH (ref 39.0–52.0)
Hemoglobin: 16.8 g/dL (ref 13.0–17.0)
Immature Granulocytes: 1 %
Lymphocytes Relative: 14 %
Lymphs Abs: 0.7 10*3/uL (ref 0.7–4.0)
MCH: 27.1 pg (ref 26.0–34.0)
MCHC: 32.2 g/dL (ref 30.0–36.0)
MCV: 84.2 fL (ref 80.0–100.0)
Monocytes Absolute: 0.5 10*3/uL (ref 0.1–1.0)
Monocytes Relative: 11 %
Neutro Abs: 3.7 10*3/uL (ref 1.7–7.7)
Neutrophils Relative %: 74 %
Platelets: 85 10*3/uL — ABNORMAL LOW (ref 150–400)
RBC: 6.19 MIL/uL — ABNORMAL HIGH (ref 4.22–5.81)
RDW: 15.2 % (ref 11.5–15.5)
WBC: 5.1 10*3/uL (ref 4.0–10.5)
nRBC: 0 % (ref 0.0–0.2)

## 2021-11-30 LAB — COMPREHENSIVE METABOLIC PANEL
ALT: 39 U/L (ref 0–44)
AST: 22 U/L (ref 15–41)
Albumin: 4.2 g/dL (ref 3.5–5.0)
Alkaline Phosphatase: 74 U/L (ref 38–126)
Anion gap: 8 (ref 5–15)
BUN: 23 mg/dL (ref 8–23)
CO2: 25 mmol/L (ref 22–32)
Calcium: 8.7 mg/dL — ABNORMAL LOW (ref 8.9–10.3)
Chloride: 101 mmol/L (ref 98–111)
Creatinine, Ser: 0.8 mg/dL (ref 0.61–1.24)
GFR, Estimated: >60 mL/min (ref >60)
Glucose, Bld: 149 mg/dL — ABNORMAL HIGH (ref 70–99)
Potassium: 4 mmol/L (ref 3.5–5.1)
Sodium: 134 mmol/L — ABNORMAL LOW (ref 135–145)
Total Bilirubin: 2.1 mg/dL — ABNORMAL HIGH (ref 0.3–1.2)
Total Protein: 8.9 g/dL — ABNORMAL HIGH (ref 6.5–8.1)

## 2021-11-30 LAB — URINALYSIS, ROUTINE W REFLEX MICROSCOPIC
Bacteria, UA: NONE SEEN
Bilirubin Urine: NEGATIVE
Glucose, UA: >=500 mg/dL — AB
Hgb urine dipstick: NEGATIVE
Ketones, ur: 20 mg/dL — AB
Leukocytes,Ua: NEGATIVE
Nitrite: NEGATIVE
Protein, ur: NEGATIVE mg/dL
Specific Gravity, Urine: 1.028 (ref 1.005–1.030)
pH: 5 (ref 5.0–8.0)

## 2021-11-30 LAB — LACTIC ACID, PLASMA: Lactic Acid, Venous: 1.3 mmol/L (ref 0.5–1.9)

## 2021-11-30 LAB — SARS CORONAVIRUS 2 BY RT PCR: SARS Coronavirus 2 by RT PCR: NEGATIVE

## 2021-11-30 LAB — LIPASE, BLOOD: Lipase: 31 U/L (ref 11–51)

## 2021-11-30 MED ORDER — SODIUM CHLORIDE 0.9 % IV BOLUS
1000.0000 mL | Freq: Once | INTRAVENOUS | Status: AC
  Administered 2021-11-30: 1000 mL via INTRAVENOUS

## 2021-11-30 MED ORDER — ACETAMINOPHEN 500 MG PO TABS
1000.0000 mg | ORAL_TABLET | Freq: Once | ORAL | Status: AC
  Administered 2021-11-30: 1000 mg via ORAL
  Filled 2021-11-30: qty 2

## 2021-11-30 MED ORDER — MECLIZINE HCL 12.5 MG PO TABS
25.0000 mg | ORAL_TABLET | Freq: Once | ORAL | Status: AC
  Administered 2021-11-30: 25 mg via ORAL
  Filled 2021-11-30: qty 2

## 2021-11-30 MED ORDER — ONDANSETRON HCL 4 MG/2ML IJ SOLN
4.0000 mg | Freq: Once | INTRAMUSCULAR | Status: AC
  Administered 2021-11-30: 4 mg via INTRAVENOUS
  Filled 2021-11-30: qty 2

## 2021-11-30 MED ORDER — SODIUM CHLORIDE 0.9 % IV SOLN
1.0000 g | Freq: Once | INTRAVENOUS | Status: AC
  Administered 2021-11-30: 1 g via INTRAVENOUS
  Filled 2021-11-30: qty 10

## 2021-11-30 MED ORDER — DOXYCYCLINE HYCLATE 100 MG PO TABS
100.0000 mg | ORAL_TABLET | Freq: Once | ORAL | Status: AC
  Administered 2021-11-30: 100 mg via ORAL
  Filled 2021-11-30: qty 1

## 2021-11-30 MED ORDER — AZITHROMYCIN 250 MG PO TABS: 250.0000 mg | ORAL_TABLET | Freq: Every day | ORAL | 0 refills | Status: DC

## 2021-11-30 NOTE — Discharge Instructions (Addendum)
Take the entire course of the antibiotics prescribed.  Continue your other home medicines including your Symbicort and Spiriva.

## 2021-11-30 NOTE — ED Notes (Signed)
Pt given water, sprite, graham crackers, and saltine crackers.

## 2021-11-30 NOTE — ED Triage Notes (Signed)
Pt having flu-like symptoms for 2 days.  Decrease intake of food and water with N/V.

## 2021-11-30 NOTE — ED Provider Notes (Signed)
[facility] EMERGENCY DEPARTMENT Provider Note   CSN: [id number] Arrival date & time: 11/30/21  6761     History  Chief Complaint  Patient presents with   Flu-Like Symptoms    [name] is a 66 y.o. male with a history including hypertension, type 2 diabetes, [name] induced steatohepatitis, GERD, COPD presenting for evaluation of a 2-day history of nausea and vomiting, general body aches,  intermittent headache, cough productive of yellow sputum and dizziness, describing a room spinning quality when he coughs or changes position (reports history of peripheral vertigo). He denies ear pain, pressure or tinnitus.   He denies fevers but currently feels chilled.  He denies shortness of breath or chest pain, also denies abdominal pain or diarrhea.  He has advanced COPD and wears oxygen 24/7.  His vomit has been nonbloody.  He has had very poor intake for the past 2 days and feels very weak.  Wife gives significant amount of his history.  She did give him an at home COVID test prior to arrival which was negative.    Of note patient is scheduled for a septoplasty with Dr. Benjamine Mola tomorrow.  The history is provided by the patient.      Home Medications Prior to Admission medications   Medication Sig Start Date End Date Taking? Authorizing Provider  azithromycin (ZITHROMAX) 250 MG tablet Take 1 tablet (250 mg total) by mouth daily. Take first 2 tablets together, then 1 every day until finished. 11/30/21  Yes Idol, Almyra Free, PA-C  acetaminophen (TYLENOL) 500 MG tablet Take 1,000 mg by mouth every 6 (six) hours as needed for headache or moderate pain.    [provider]  albuterol (PROVENTIL) (2.5 MG/3ML) 0.083% nebulizer solution Take 3 mLs (2.5 mg total) by nebulization every 2 (two) hours as needed for wheezing or shortness of breath (cough). 06/13/20   Roxan Hockey, MD  ALPRAZolam [name]) 0.25 MG tablet Take 0.25 mg by mouth 2 (two) times daily as needed for sleep or anxiety.     [provider]  aspirin EC 81 MG tablet Take 1 tablet (81 mg total) by mouth daily with breakfast. 06/13/20   Emokpae, Courage, MD  atorvastatin (LIPITOR) 40 MG tablet Take 40 mg by mouth daily.    [provider]  Blood Glucose Monitoring Suppl (ONETOUCH VERIO) w/Device KIT 2 (two) times daily. 09/27/20   [provider]  budesonide-formoterol (SYMBICORT) 160-4.5 MCG/ACT inhaler Inhale 2 puffs into the lungs 2 (two) times daily. 11/10/21   Tanda Rockers, MD  Cholecalciferol (VITAMIN D3) 25 MCG (1000 UT) CAPS Take 1,000 Units by mouth daily.    [provider]  [employer] VITAMIN C 250 MG tablet Take 250 mg by mouth daily. 06/13/20   [provider]  diltiazem (CARDIZEM CD) 240 MG 24 hr capsule Take 240 mg by mouth daily. 01/12/21   [provider]  ferrous sulfate 325 (65 FE) MG tablet Take 325 mg by mouth daily with breakfast.    [provider]  fluticasone (FLONASE) 50 MCG/ACT nasal spray Place 1 spray into both nostrils daily as needed for allergies or rhinitis.    [provider]  gabapentin (NEURONTIN) 300 MG capsule Take 1 capsule (300 mg total) by mouth at bedtime. 06/13/20   Roxan Hockey, MD  glimepiride (AMARYL) 1 MG tablet Take 1 mg by mouth daily with breakfast.    [provider]  guaiFENesin (MUCINEX) 600 MG 12 hr tablet Take 1 tablet (600 mg total) by  mouth 2 (two) times daily. 06/13/20   Roxan Hockey, MD  guaiFENesin-dextromethorphan (ROBITUSSIN DM) 100-10 MG/5ML syrup Take 5 mLs by mouth every 4 (four) hours as needed for cough (chest congestion). 08/23/20   Manuella Ghazi, Pratik D, DO  JARDIANCE 25 MG TABS tablet Take 25 mg by mouth every evening.  01/01/17   [provider]  metFORMIN (GLUCOPHAGE) 1000 MG tablet Take 1,000 mg by mouth daily with supper. 06/20/21   [provider]  Multiple Vitamin (MULTIVITAMIN WITH MINERALS) TABS tablet Take 1 tablet by mouth at bedtime. 06/13/20   Roxan Hockey,  MD  [facility] DELICA LANCETS 74B MISC  08/26/18   [provider]  [facility] VERIO test strip  08/26/18   [provider]  OXYGEN Inhale 3 L into the lungs continuous.    [provider]  pantoprazole (PROTONIX) 40 MG tablet TAKE 1 TABLET BY MOUTH EVERY DAY 07/27/21   [name], PA-C  PROAIR HFA 108 [phone] Base) MCG/ACT inhaler Inhale 2 puffs into the lungs every 4 (four) hours as needed for wheezing or shortness of breath. 11/10/21   Tanda Rockers, MD  sertraline (ZOLOFT) 50 MG tablet Take 1 tablet (50 mg total) by mouth daily. 06/13/20   Roxan Hockey, MD  SitaGLIPtin-MetFORMIN HCl [id number] MG TB24 Take 1 tablet by mouth daily.    [provider]  sodium chloride (OCEAN) 0.65 % SOLN nasal spray Place 1 spray into both nostrils as needed for congestion. 08/23/20   Manuella Ghazi, Pratik D, DO  tamsulosin (FLOMAX) 0.4 MG CAPS capsule Take 0.4 mg by mouth daily.    [provider]  thiamine 100 MG tablet Take 1 tablet (100 mg total) by mouth daily. 06/14/20   Roxan Hockey, MD  Tiotropium Bromide Monohydrate (SPIRIVA RESPIMAT) 2.5 MCG/ACT AERS Inhale 2 puffs into the lungs daily. 11/10/21   Tanda Rockers, MD      Allergies    Baclofen and Simvastatin    Review of Systems   Review of Systems  Constitutional:  Positive for chills. Negative for fever.  HENT:  Negative for congestion and sore throat.   Eyes: Negative.   Respiratory:  Positive for cough. Negative for chest tightness and shortness of breath.        He reports his shortness of breath is baseline for him.  Cardiovascular:  Negative for chest pain.  Gastrointestinal:  Positive for nausea and vomiting. Negative for abdominal pain.  Genitourinary: Negative.   Musculoskeletal:  Positive for myalgias. Negative for arthralgias, joint swelling and neck pain.  Skin: Negative.  Negative for rash and wound.  Neurological:  Positive for headaches. Negative for dizziness, weakness, light-headedness and  numbness.       Reports he has had headaches but does not have one currently.  Psychiatric/Behavioral: Negative.     Physical Exam Updated Vital Signs BP 117/78   Pulse 92   Temp 98.1 F (36.7 C)   Resp 20   Ht 5' 7" (1.702 m)   Wt 77.1 kg   SpO2 97%   BMI 26.63 kg/m  Physical Exam Vitals and nursing note reviewed.  Constitutional:      Appearance: He is well-developed.  HENT:     Head: Normocephalic and atraumatic.     Mouth/Throat:     Mouth: Mucous membranes are dry.  Eyes:     Conjunctiva/sclera: Conjunctivae normal.  Cardiovascular:     Rate and Rhythm: Normal rate and regular rhythm.     Heart sounds: Normal heart  sounds.  Pulmonary:     Effort: Pulmonary effort is normal.     Breath sounds: Normal breath sounds. No wheezing.  Abdominal:     General: Bowel sounds are normal.     Palpations: Abdomen is soft.     Tenderness: There is no abdominal tenderness.  Musculoskeletal:        General: Normal range of motion.     Cervical back: Normal range of motion.  Skin:    General: Skin is warm and dry.  Neurological:     Mental Status: He is alert.    ED Results / Procedures / Treatments   Labs (all labs ordered are listed, but only abnormal results are displayed) Labs Reviewed  CBC WITH DIFFERENTIAL/PLATELET - Abnormal; Notable for the following components:      Result Value   RBC 6.19 (*)    HCT 52.1 (*)    Platelets 85 (*)    All other components within normal limits  COMPREHENSIVE METABOLIC PANEL - Abnormal; Notable for the following components:   Sodium 134 (*)    Glucose, Bld 149 (*)    Calcium 8.7 (*)    Total Protein 8.9 (*)    Total Bilirubin 2.1 (*)    All other components within normal limits  URINALYSIS, ROUTINE W REFLEX MICROSCOPIC - Abnormal; Notable for the following components:   Glucose, UA >=500 (*)    Ketones, ur 20 (*)    All other components within normal limits  SARS CORONAVIRUS 2 BY RT PCR  LIPASE, BLOOD  LACTIC ACID, PLASMA     EKG None  Radiology DG Chest Portable 1 View  Result Date: 11/30/2021 CLINICAL DATA:  Provided history: Cough. Weakness. Shortness of breath. EXAM: PORTABLE CHEST 1 VIEW COMPARISON:  Prior chest radiographs 06/06/2021 and earlier. FINDINGS: Heart size within normal limits. Aortic atherosclerosis. Known emphysema. Chronic prominence of the interstitial lung markings within portions of the right upper and right lower lobes. Bandlike opacity within the left upper lobe, new from prior exams. No evidence of pleural effusion or pneumothorax. No acute bony abnormality identified. IMPRESSION: Bandlike opacity within the left upper lobe, new from prior exams. This may reflect atelectasis or pneumonia. Redemonstrated chronic prominence of the interstitial lung markings within portions of the right upper and right lower lobes. Calcified granulomas within the right upper lobe. Aortic Atherosclerosis (ICD10-I70.0) and Emphysema (ICD10-J43.9). Electronically Signed   By: Kellie Simmering D.O.   On: 11/30/2021 10:26    Procedures Procedures    Medications Ordered in ED Medications  sodium chloride 0.9 % bolus 1,000 mL (0 mLs Intravenous Stopped 11/30/21 1156)  ondansetron (ZOFRAN) injection 4 mg (4 mg Intravenous Given 11/30/21 1004)  meclizine (ANTIVERT) tablet 25 mg (25 mg Oral Given 11/30/21 1004)  cefTRIAXone (ROCEPHIN) 1 g in sodium chloride 0.9 % 100 mL IVPB (0 g Intravenous Stopped 11/30/21 1319)  doxycycline (VIBRA-TABS) tablet 100 mg (100 mg Oral Given 11/30/21 1158)  acetaminophen (TYLENOL) tablet 1,000 mg (1,000 mg Oral Given 11/30/21 1310)    ED Course/ Medical Decision Making/ A&P                           Medical Decision Making Pt with flu lie sx including myalgia, cough, chills, n/v, intermittent headache, hx of DM, HTN, COPD, reported frequent hx of pneumonia. Diff including new pneumonia, Covid 19, influenza or other viral process.  With n/v but no abd pain, doubt intrabdominal process,  benign abd exam  reassuring.  Pt was able to maintain PO challenge and felt better at time of dc.  No hypoxia here.  Return precautions outlined.      Amount and/or Complexity of Data Reviewed Labs: ordered.    Details: Significant labs including a negative lactic acid, he does have thrombocytopenia which is a chronic finding for this patient.  He has an elevated glucose at 149, patient is diabetic. electrolytes reassuring.  Ketones in urine with suggested dehydration,  this is not dka with no anion gap or low C02. Radiology: ordered.    Details: cxr atelectasis vs pneumonia -presentation most suggestive of pneumonia  Risk OTC drugs. Prescription drug management.           Final Clinical Impression(s) / ED Diagnoses Final diagnoses:  Community acquired pneumonia of left upper lobe of lung    Rx / DC Orders ED Discharge Orders          Ordered    azithromycin (ZITHROMAX) 250 MG tablet  Daily        11/30/21 1431              Evalee Jefferson, PA-C 12/01/21 1307    Wyvonnia Dusky, MD 12/01/21 1422

## 2021-12-01 ENCOUNTER — Telehealth: Payer: Self-pay | Admitting: Internal Medicine

## 2021-12-01 DIAGNOSIS — J189 Pneumonia, unspecified organism: Secondary | ICD-10-CM

## 2021-12-01 HISTORY — DX: Pneumonia, unspecified organism: J18.9

## 2021-12-01 NOTE — Telephone Encounter (Signed)
Called and spoke with patients wife Katharine Look. Added patient to Friday 6/2 in [facility] with Dr. Melvyn Novas at 2 pm. Nothing further needed

## 2021-12-01 NOTE — Telephone Encounter (Signed)
Ok to add on to Friday's schedule

## 2021-12-01 NOTE — Telephone Encounter (Signed)
Pt' wife called stating pt needed follow-up from ER visit for pneumonia.  No appts for next week and preferred not to go to [facility] office.  Next available 7/7 in [place].  Will be going out of town on 6/9 for 9 days.  Please advise.   Dr. Melvyn Novas please advise, are you okay waiting to see patient until July or should we add on/ Double book in [facility] office?

## 2021-12-04 LAB — TESTOSTERONE, % FREE: Testosterone-% Free: 1 % — ABNORMAL HIGH (ref 0.2–0.7)

## 2021-12-06 DIAGNOSIS — E785 Hyperlipidemia, unspecified: Secondary | ICD-10-CM | POA: Diagnosis not present

## 2021-12-06 DIAGNOSIS — E1140 Type 2 diabetes mellitus with diabetic neuropathy, unspecified: Secondary | ICD-10-CM | POA: Diagnosis not present

## 2021-12-06 DIAGNOSIS — I10 Essential (primary) hypertension: Secondary | ICD-10-CM | POA: Diagnosis not present

## 2021-12-08 ENCOUNTER — Encounter: Payer: Self-pay | Admitting: Internal Medicine

## 2021-12-08 ENCOUNTER — Ambulatory Visit: Payer: HMO | Admitting: Internal Medicine

## 2021-12-08 DIAGNOSIS — J449 Chronic obstructive pulmonary disease, unspecified: Secondary | ICD-10-CM

## 2021-12-08 DIAGNOSIS — J9611 Chronic respiratory failure with hypoxia: Secondary | ICD-10-CM | POA: Diagnosis not present

## 2021-12-08 MED ORDER — AMOXICILLIN-POT CLAVULANATE 875-125 MG PO TABS: 1.0000 | ORAL_TABLET | Freq: Two times a day (BID) | ORAL | 0 refills | Status: DC

## 2021-12-08 MED ORDER — PREDNISONE 10 MG PO TABS: ORAL_TABLET | ORAL | 0 refills | Status: DC

## 2021-12-08 MED ORDER — ALBUTEROL SULFATE (2.5 MG/3ML) 0.083% IN NEBU: 2.5000 mg | INHALATION_SOLUTION | RESPIRATORY_TRACT | 12 refills | Status: DC | PRN

## 2021-12-08 NOTE — Progress Notes (Signed)
[name], male    DOB: 10-09-1955     MRN: [id number]   Brief patient profile:  60 yowm MM/quit smoking 2012 with COPD GOLD III criteria 06/17/20 previously followed by Dr [name] referred back to pulmonary clinic 12/09/2019 in [place].   History of Present Illness  12/09/2019  Pulmonary/ 1st office eval/[name]  symbicort 160/spiriva  Chief Complaint  Patient presents with   Pulmonary Consult    Former patient of Dr [name].  Breathing has been worse over the past few wks. Recently treated with Doxy per PCP for COPD exacerbation. He is occ coughing up some green sputum.   flare 11/23/19  rx doxy 5/19 x 7 days  Dyspnea:  Baseline uses HC parking / MMRC3 = can't walk 100 yards even at a slow pace at a flat grade s stopping due to sob  On 2lpm  Cough: improved less purulent purulent  Sleep: wakes up coughing since onset /sometimes uses saba proair  SABA use: rarely daytime rec Continue protonix 40 mg Take 30-60 min before first meal of the day  GERD diet  Plan A = Automatic = Always=    Budesonide/formoterol (symbicort ) 2 puffs first thing in am and 12 hours later and use spiriva x 2 pffs after the morning symbicort (ok to change back to the powder after 2 weeks but will need to stop it if you start coughing  Work on inhaler technique  Plan B = Backup (to supplement plan A, not to replace it) Only use your albuterol inhaler as a rescue medication    Plan C = Crisis (instead of Plan B   Make sure you check your oxygen saturations at highest level of activity to be sure it stays over 90%   Prednisone 10 mg take  4 each am x 2 days,   2 each am x 2 days,  1 each am x 2 days and stop  For cough > mucinex dm 1200 mg every 12 hours as needed  Would wait another 2 weeks before you get your first shot for covid 19    02/03/2020  f/u ov/[name] re: GOLD III/ 02 dep/ rx symb /spriva powder  Chief Complaint  Patient presents with   Acute Visit    shortness of breath with exertion, productive  cough with green phlegm  Dyspnea: abruptly worse x 48 h = MMRC3 = can't walk 100 yards even at a slow pace at a flat grade s stopping due to sob   Cough: worse with green mucus/ worse since changed back to dpi spiriva Sleeping: 2 am tends to cramp SABA use: none on day of ov  02: 2lpm  Up to 2.5 lpm  rec zpak Prednisone 10 mg take  4 each am x 2 days,   2 each am x 2 days,  1 each am x 2 days and stop  Change spiriva to respimat 2 puff each am  For cough mucinex dm up to 1200 mg every 12 hours and supplement hydocodone every 4 hours as needed  Get your covid vaccination asap We will call [employer] for a best fit analysis for portable 0xygen  In meantime, Make sure you check your oxygen saturations at highest level of activity to be sure it stays over 90% and adjust upward to maintain this level if needed but remember to turn it back to previous settings when you stop (to conserve your supply).    [name] eval 08/01/20 rec ST eval done 08/05/20: low risk  asp SLP Diet Recommendations Regular solids;Thin liquid  Liquid Administration via Straw;Cup  Medication Administration Whole meds with liquid  Compensations Slow rate;Small sips/bites  Postural Changes Seated upright at 90 degrees;Remain semi-upright after after feeds/meals (Comment)    06/06/2021  f/u ov/[place] office/[name] re: GOLD 3/ 02 dep / maint on spiriva/ symbicort 160   Chief Complaint  Patient presents with   Follow-up    Feels breathing has worsened since last OV.  3lpm cont. At home 2.5 lpm cont. When out of house to conserve O2  Dyspnea:  sob at rest on 3lpm 24/7  Cough: slt green then clear  Sleeping: bed blocks / 3 pillows new  SABA use: 2-3 x per weeks/ no neb  02: 3lpm 24/7  Covid status: vax x 3  Lung cancer screening: scheduled for 06/28/21  Chills s sweats x 24 h Recurrent swelling R > L  Fleeting L cp cleared up p levoquin Rec Plan A = Automatic = Always=    Symbicort Take 2 puffs first thing in am and then  another 2 puffs about 12 hours later and spiriva respimat 2 puffs after am symbicort  Plan B = Backup (to supplement plan A, not to replace it) Only use your albuterol inhaler as a rescue medication  Plan C = Crisis (instead of Plan B but only if Plan B stops working) - only use your albuterol nebulizer if you first try Plan B and it fails to help Make sure you check your oxygen saturation  at your highest level of activity  to be sure it stays over 90%  Zpak  Prednisone 10 mg take  4 each am x 2 days,   2 each am x 2 days,  1 each am x 2 days and stop Please schedule a follow up visit in 3 months but call sooner if needed    09/01/2021  f/u ov/[place] office/[name] re: GOLD 3 COPD  maint on symb/spriva/02   Chief Complaint  Patient presents with   Follow-up    Breathing has improved. 3LO2 all of the time cont.   Dyspnea:  walks to MB and back = > 100 ft and flat on 02 3lpm cont  Cough: none/ some sneezing  Sleeping: bed blocks/ 3 pillows SABA use: rarely  02: 3lpm sleeping  Rec Titrate amb 02 F/u q 12 m  11/29/21 vertigo vomiting >  ER 5/24 rx zpak and missed surgery on 12/02/22 rescheduled for Aug 2023 for chronic severe L nasal congestion with very small osteo     12/08/2021  post f/u ov/[place] office/[name] re: worse cough p zpak maint on symb/spiriva   Chief Complaint  Patient presents with   Hospitalization Follow-up    Ed for pneumonia at [place] on 5/25. Still coughing up green sputum and having SOB   Aches and vomiting resolved/ ha gone, still can't breathe thru L nose  Dyspnea:  still able to get to mb and back on 3lpm  Cough: honking cough/ robitussin and flutter not helping> purulent sputum   Sleeping: cough disturbs sleeps SABA use: only used hfa  once or twice  in last 24 h prior to OV  / doesn't have neb  Covid status: x 3 vax      No obvious day to day or daytime variability or assoc  mucus plugs or hemoptysis or cp or chest tightness, subjective wheeze or overt    hb symptoms on ppi q am    sl without nocturnal  or early  am exacerbation  of respiratory  c/o's or need for noct saba. Also denies any obvious fluctuation of symptoms with weather or environmental changes or other aggravating or alleviating factors except as outlined above   No unusual exposure hx or h/o childhood pna/ asthma or knowledge of premature birth.  Current Allergies, Complete Past Medical History, Past Surgical History, Family History, and Social History were reviewed in [employer] record.  ROS  The following are not active complaints unless bolded Hoarseness, sore throat, dysphagia, dental problems, itching, sneezing,  nasal congestion or discharge of excess mucus or purulent secretions, ear ache,   fever, chills, sweats, unintended wt loss or wt gain, classically pleuritic or exertional cp,  orthopnea pnd or arm/hand swelling  or leg swelling, presyncope, palpitations, abdominal pain, anorexia, nausea, vomiting, diarrhea  or change in bowel habits or change in bladder habits, change in stools or change in urine, dysuria, hematuria,  rash, arthralgias, visual complaints, headache, numbness, weakness or ataxia or problems with walking or coordination,  change in mood or  memory.        Current Meds  Medication Sig   acetaminophen (TYLENOL) 500 MG tablet Take 1,000 mg by mouth every 6 (six) hours as needed for headache or moderate pain.   albuterol (PROVENTIL) (2.5 MG/3ML) 0.083% nebulizer solution Take 3 mLs (2.5 mg total) by nebulization every 2 (two) hours as needed for wheezing or shortness of breath (cough).   ALPRAZolam (XANAX) 0.25 MG tablet Take 0.25 mg by mouth 2 (two) times daily as needed for sleep or anxiety.   aspirin EC 81 MG tablet Take 1 tablet (81 mg total) by mouth daily with breakfast.   atorvastatin (LIPITOR) 40 MG tablet Take 40 mg by mouth daily.   Blood Glucose Monitoring Suppl (ONETOUCH VERIO) w/Device KIT 2 (two) times daily.    budesonide-formoterol (SYMBICORT) 160-4.5 MCG/ACT inhaler Inhale 2 puffs into the lungs 2 (two) times daily.   Cholecalciferol (VITAMIN D3) 25 MCG (1000 UT) CAPS Take 1,000 Units by mouth daily.   [facility] VITAMIN C 250 MG tablet Take 250 mg by mouth daily.   diltiazem (CARDIZEM CD) 240 MG 24 hr capsule Take 240 mg by mouth daily.   ferrous sulfate 325 (65 FE) MG tablet Take 325 mg by mouth daily with breakfast.   fluticasone (FLONASE) 50 MCG/ACT nasal spray Place 1 spray into both nostrils daily as needed for allergies or rhinitis.   gabapentin (NEURONTIN) 300 MG capsule Take 1 capsule (300 mg total) by mouth at bedtime.   glimepiride (AMARYL) 1 MG tablet Take 1 mg by mouth daily with breakfast.   guaiFENesin (MUCINEX) 600 MG 12 hr tablet Take 1 tablet (600 mg total) by mouth 2 (two) times daily.   guaiFENesin-dextromethorphan (ROBITUSSIN DM) 100-10 MG/5ML syrup Take 5 mLs by mouth every 4 (four) hours as needed for cough (chest congestion).   JARDIANCE 25 MG TABS tablet Take 25 mg by mouth every evening.    metFORMIN (GLUCOPHAGE) 1000 MG tablet Take 1,000 mg by mouth daily with supper.   Multiple Vitamin (MULTIVITAMIN WITH MINERALS) TABS tablet Take 1 tablet by mouth at bedtime.   ONETOUCH DELICA LANCETS 37C MISC    ONETOUCH VERIO test strip    OXYGEN Inhale 3 L into the lungs continuous.   pantoprazole (PROTONIX) 40 MG tablet TAKE 1 TABLET BY MOUTH EVERY DAY   PROAIR HFA 108 (90 Base) MCG/ACT inhaler Inhale 2 puffs into the lungs every 4 (four) hours as needed for wheezing  or shortness of breath.   sertraline (ZOLOFT) 50 MG tablet Take 1 tablet (50 mg total) by mouth daily.   SitaGLIPtin-MetFORMIN HCl [phone] MG TB24 Take 1 tablet by mouth daily.   sodium chloride (OCEAN) 0.65 % SOLN nasal spray Place 1 spray into both nostrils as needed for congestion.   tamsulosin (FLOMAX) 0.4 MG CAPS capsule Take 0.4 mg by mouth daily.   thiamine 100 MG tablet Take 1 tablet (100 mg total) by mouth daily.    Tiotropium Bromide Monohydrate (SPIRIVA RESPIMAT) 2.5 MCG/ACT AERS Inhale 2 puffs into the lungs daily.                  Past Medical History:  Diagnosis Date   Anxiety    Asthma    Chest pain    a. 2003 Cath: nl cors.   COPD (chronic obstructive pulmonary disease) ([place])    Depression    Diabetes mellitus    Dyslipidemia    Dysrhythmia    Fracture 12/15   lower back "L-1"   GERD (gastroesophageal reflux disease)    Hypertension    PONV (postoperative nausea and vomiting)    Splenomegaly        Objective:    Wt   12/08/2021          164 09/01/2021        167  06/06/2021      162 12/08/2020          163 09/15/2020        154  05/23/2020      166 04/28/2020      166   12/30/19 162 lb (73.5 kg)  12/21/19 162 lb (73.5 kg)  12/09/19 162 lb (73.5 kg)   Vital signs reviewed  12/08/2021  - Note at rest 02 sats  95% on 3lpm    General appearance:    amb somber wm nad    HEENT :  Oropharynx  clear/ upper full dentures  Nasal turbintes severe edema/ erythema/mp secretions on L    NECK :  without JVD/Nodes/TM/ nl carotid upstrokes bilaterally   LUNGS: no acc muscle use,  Mod barrel  contour chest wall with bilateral  Distant bs and a few insp exp rhonchi R base  but without cough on insp or exp maneuvers and mod  Hyperresonant  to  percussion bilaterally     CV:  RRR  no s3 or murmur or increase in P2, and no edema   ABD:  soft and nontender with pos mid insp Hoover's  in the supine position. No bruits or organomegaly appreciated, bowel sounds nl  MS:   Ext warm without deformities or   obvious joint restrictions , calf tenderness, cyanosis or clubbing  SKIN: warm and dry without lesions    NEURO:  alert, approp, nl sensorium with  no motor or cerebellar deficits apparent.             I personally reviewed images and agree with radiology impression as follows:  CXR:   pa and lateral  11/30/21 Bandlike opacity within the left upper lobe, new from prior  exams. This may reflect atelectasis or pneumonia.  Redemonstrated chronic prominence of the interstitial lung markings within portions of the right upper and right lower lobes.  Calcified granulomas within the right upper lobe.  Aortic Atherosclerosis (ICD10-I70.0) and Emphysema (ICD10-J43.9).      Assessment

## 2021-12-08 NOTE — Assessment & Plan Note (Signed)
Started on 02 around 2019 As of 02/03/2020  2lpm 24/7  -  02/03/2020 referred to adapt for Best fit for amb 02 > 3lpm pulsed tank -  04/28/2020   Walked 3lpm pulsed tank  approx   400 ft  @ moderate pace  stopped due to  Sob/tired  with sats still 95%    -  09/15/2020   Walked 3lpm cont approx   200 ft  @ slow/slt undsteady  pace  stopped due to  Sob/fatigue   With sats 91% at rest sats were 86% RA   12/08/2020   Walked RA  approx   500 ft  @ moderate pace  stopped due to  End of study, a little sob, sats still 90%   rec as of 12/08/2021   =  3lpm hs but daytime titrate to sats > 90%   Again advised Make sure you check your oxygen saturation at your highest level of activity to be sure it stays over 90% and keep track of it at least once a week, more often if breathing getting worse, and let me know if losing ground.           Each maintenance medication was reviewed in detail including emphasizing most importantly the difference between maintenance and prns and under what circumstances the prns are to be triggered using an action plan format where appropriate.  Total time for H and P, chart review, counseling, reviewing hfa/neb/smi/02 device(s) and generating customized AVS unique to this office visit / same day charting  > 40 min acute ov

## 2021-12-08 NOTE — Assessment & Plan Note (Addendum)
Quit smoking 2012 - alpha one AT screen  01/31/17  MM  Level 169 - PFT's  06/18/2019  FEV1 1.12 (35 % ) ratio 0.46  p 0 % improvement from saba p ? prior to study with DLCO  11.91 (48%) corrects to 3.18 (754%)  for alv volume and FV curve classic concavity   - 12/09/2019  After extensive coaching inhaler device,  effectiveness =    90% with smi so try spiriva respimat 2 bid instead of DPI  - 09/15/2020  After extensive coaching inhaler device,  effectiveness =    75% > breztri trial > preferreed sym/ spiriva smi - 12/08/2020  After extensive coaching inhaler device,  effectiveness = 80%      Acute flare in setting of viral infection with flare of sinusitis which is acute on chronic and has now missed scheduled sinus surgery so rec  Augmentin 875 mg take one pill twice daily  X 10 days   Afrin before flonase for no more than 10 days NS irrigation as much as possible Control cough with G DM/ flutter and max gerd rx to include noct h2 while coughing  Prednisone 10 mg take  4 each am x 2 days,   2 each am x 2 days,  1 each am x 2 days and stop  F/u post sinus surgery but call prn in meantime if worsen again.

## 2021-12-08 NOTE — Patient Instructions (Addendum)
For cough > mucinex dm 1200 mg every 12 hours / use flutter valve as much as you can and add Pepcid 20 mg one hour before bed   I emphasized that nasal steroids  like flose have no immediate benefit in terms of improving symptoms.  To help them reached the target tissue, the patient should use Afrin two puffs every 12 hours applied one min before using the nasal steroids.  Afrin should be stopped after no more than 5 days.  If the symptoms worsen, Afrin can be restarted after 5 days off of therapy to prevent rebound congestion from overuse of Afrin.  I also emphasized that in no way are nasal steroids a concern in terms of "addiction".   Augmentin 875 mg take one pill twice daily  X 10 days - take at breakfast and supper with large glass of water.  It would help reduce the usual side effects (diarrhea and yeast infections) if you ate cultured yogurt at lunch.   Prednisone 10 mg take  4 each am x 2 days,   2 each am x 2 days,  1 each am x 2 days and stop   Make sure you check your oxygen saturation  AT  your highest level of activity (not after you stop)   to be sure it stays over 90% and adjust  02 flow upward to maintain this level if needed but remember to turn it back to previous settings when you stop (to conserve your supply).   Remember to use PROAIR up to every 4 hours if needed and if not helpful > nebulizer  up to every 4 hours if needed    Please schedule a follow up visit in 3 months but call sooner if needed

## 2021-12-29 DIAGNOSIS — Z79899 Other long term (current) drug therapy: Secondary | ICD-10-CM | POA: Diagnosis not present

## 2021-12-29 DIAGNOSIS — I10 Essential (primary) hypertension: Secondary | ICD-10-CM | POA: Diagnosis not present

## 2021-12-29 DIAGNOSIS — J9611 Chronic respiratory failure with hypoxia: Secondary | ICD-10-CM | POA: Diagnosis not present

## 2021-12-29 DIAGNOSIS — E1140 Type 2 diabetes mellitus with diabetic neuropathy, unspecified: Secondary | ICD-10-CM | POA: Diagnosis not present

## 2021-12-29 DIAGNOSIS — K760 Fatty (change of) liver, not elsewhere classified: Secondary | ICD-10-CM | POA: Diagnosis not present

## 2021-12-29 DIAGNOSIS — D696 Thrombocytopenia, unspecified: Secondary | ICD-10-CM | POA: Diagnosis not present

## 2021-12-29 DIAGNOSIS — Z515 Encounter for palliative care: Secondary | ICD-10-CM | POA: Diagnosis not present

## 2021-12-29 DIAGNOSIS — F419 Anxiety disorder, unspecified: Secondary | ICD-10-CM | POA: Diagnosis not present

## 2021-12-29 DIAGNOSIS — N400 Enlarged prostate without lower urinary tract symptoms: Secondary | ICD-10-CM | POA: Diagnosis not present

## 2022-01-05 DIAGNOSIS — D696 Thrombocytopenia, unspecified: Secondary | ICD-10-CM | POA: Diagnosis not present

## 2022-01-05 DIAGNOSIS — E1140 Type 2 diabetes mellitus with diabetic neuropathy, unspecified: Secondary | ICD-10-CM | POA: Diagnosis not present

## 2022-01-05 DIAGNOSIS — R739 Hyperglycemia, unspecified: Secondary | ICD-10-CM | POA: Diagnosis not present

## 2022-01-05 DIAGNOSIS — E785 Hyperlipidemia, unspecified: Secondary | ICD-10-CM | POA: Diagnosis not present

## 2022-01-05 DIAGNOSIS — I10 Essential (primary) hypertension: Secondary | ICD-10-CM | POA: Diagnosis not present

## 2022-01-11 ENCOUNTER — Encounter: Payer: Self-pay | Admitting: Internal Medicine

## 2022-01-11 ENCOUNTER — Telehealth: Payer: Self-pay | Admitting: Internal Medicine

## 2022-01-11 NOTE — Telephone Encounter (Signed)
RECALL FOR ULTRASOUND 

## 2022-01-11 NOTE — Telephone Encounter (Signed)
Recall sent 

## 2022-01-18 ENCOUNTER — Telehealth: Payer: Self-pay | Admitting: Internal Medicine

## 2022-01-18 DIAGNOSIS — K7581 Nonalcoholic steatohepatitis (NASH): Secondary | ICD-10-CM

## 2022-01-18 NOTE — Telephone Encounter (Signed)
Called pt. Aware of [place] appt details.

## 2022-01-18 NOTE — Telephone Encounter (Signed)
Pt is aware of VV with [facility] and said he also received a letter to schedule an [place]. Please call [phone]

## 2022-01-19 DIAGNOSIS — J449 Chronic obstructive pulmonary disease, unspecified: Secondary | ICD-10-CM | POA: Diagnosis not present

## 2022-01-25 NOTE — Progress Notes (Addendum)
Primary Care Physician:  Asencion Noble, MD  Primary GI: Dr. [name]  Patient Location: Home   Provider Location: [facility] office   Reason for Visit: Routine follow-up   Persons present on the virtual encounter, with roles: Aliene Altes, PA-C (Provider), [name] (patient)   Total time (minutes) spent on medical discussion: 7 minutes  Virtual Visit via video note Due to COVID-19, visit is conducted virtually and was requested by patient.   I connected with [name] on 01/26/22 at  9:00 AM EDT by video and verified that I am speaking with the correct person using two identifiers.   I discussed the limitations, risks, security and privacy concerns of performing an evaluation and management service by video and the availability of in person appointments. I also discussed with the patient that there may be a patient responsible charge related to this service. The patient expressed understanding and agreed to proceed.  Chief Complaint  Patient presents with   Follow-up     History of Present Illness: [name] is a 66 y.o. male with history significant for GERD, adenomatous colon polyps, hemorrhoids with intermittent rectal bleeding, biopsy-proven NASH fibrosis, elastography F2/F3, chronic thrombocytopenia following with hematology, splenomegaly, portal gastropathy, currently being followed for routine cirrhosis care, presenting today for follow-up.   Prior EGD June 2022 with normal esophagus s/p dilation, portal hypertensive gastropathy.  Due for repeat in 2024.  Recent colonoscopy 08/07/2021: Nonbleeding internal hemorrhoids, three 3-6 mm polyps in the transverse colon resected and retrieved, otherwise normal exam.  Pathology with 1 tubular adenoma, otherwise benign colonic mucosa.  Recommended 5-year repeat.  He was last seen in our office 07/20/2021.  GERD is well controlled on Protonix 40 mg daily.  Reported intermittent toilet tissue hematochezia a couple times a month in  the setting of prolapsed hemorrhoids.  He remains fairly well compensated for NASH fibrosis/cirrhosis.  On exam, he was found to have unilateral 1+ pitting edema of the right lower extremity extending from foot to three quarters way up to his shin.  Plan to obtain ultrasound to rule out DVT, update routine labs and [place] screening, proceed with colonoscopy (results above).  Venous ultrasound was negative for DVT. [place] screening negative.  Today:  NASH:  MELD Na: 6 (07/31/21) Abdominal US scheduled 7/21:   Doing well at this time.  Reports chronic swelling in his right foot that is unchanged.  States this is same as when I saw him in the office last time.  He has follow-up with his PCP in September and plans to discuss at that time.  Denies any pain or redness of the right foot.  Denies swelling in his left leg or abdominal distention.  Denies yellowing of the eyes or skin, mental status changes, itching.  Bruises easily.  No bleeding.   GERD:  Well controlled on Protonix 40 mg daily.  Denies nausea, vomiting, abdominal pain, dysphagia.  Rectal bleeding:  Reports no bleeding lately.  His bowels are moving well also starting Benefiber daily.  No black stools.  He is currently taking Augmentin for sinus infection.  He is feeling better, but has continued to have some drainage and intermittent cough.  He did have some worsening shortness of breath at initial diagnosis, but that is improving.  He is maintaining on his baseline 3 L supplemental O2 via nasal cannula.  States O2 saturations were 94% this morning.    Past Medical History:  Diagnosis Date   Anxiety    Arthritis  Asthma    Chest pain    a. 2003 Cath: nl cors.   Concussion    COPD (chronic obstructive pulmonary disease) ([place])    chronically on 3-4L    Depression    Diabetes mellitus    Dyslipidemia    Dysrhythmia    Fracture 06/2014   lower back "L-1"   GERD (gastroesophageal reflux disease)    Hypertension    NASH  (nonalcoholic steatohepatitis) 2018   [name], undergoing routine cirrhosis care; [name]   Pneumonia    PONV (postoperative nausea and vomiting)    Splenomegaly    Thrombocytopenia ([place])      Past Surgical History:  Procedure Laterality Date   BIOPSY  07/26/2016   Procedure: BIOPSY;  Surgeon: [name], MD;  [facility];  Service: Endoscopy;;  gastric   CARDIAC CATHETERIZATION  10/22/2001   normal coronary arteriea (Dr. Domenic Moras)   COLONOSCOPY  08/31/2011   BXU:XYBFXOVA rectal and colon polyps-treated. Single tubular adenoma. Next TCS 08/2016   COLONOSCOPY N/A 07/26/2016   Dr. [name]: diverticulosis in sigmoid and descending colon. 6 mm benign splenic flexure. surveillance 5 yeras   COLONOSCOPY WITH PROPOFOL N/A 08/07/2021   Surgeon: Hurshel Keys K, DO;  Nonbleeding internal hemorrhoids, three 3-6 mm polyps in the transverse colon resected and retrieved, otherwise normal exam.  Pathology with 1 tubular adenoma, otherwise benign colonic mucosa.  Recommended 5-year repeat.   ESOPHAGOGASTRODUODENOSCOPY N/A 07/26/2016   Dr. [name]: normal esophagus, portal gastropathy, chronic gastritis, normal duodenum, screening EGD 2 years    ESOPHAGOGASTRODUODENOSCOPY (EGD) WITH ESOPHAGEAL DILATION N/A 05/15/2013   NVB:TYOMAY esophagus-status post Venia Minks dilation/Portal gastropathy. Antral erosions-status post biopsy. extrinsic compression along lesser curvature likely secondary to splenomegaly. gastric bx benign   ESOPHAGOGASTRODUODENOSCOPY (EGD) WITH PROPOFOL N/A 12/12/2020   Surgeon: [name], MD; ormal esophagus s/p dilation, portal hypertensive gastropathy.  Due for repeat in 2024.   HERNIA REPAIR  03/2009   MALONEY DILATION N/A 12/12/2020   Procedure: Venia Minks DILATION;  Surgeon: [name], MD;  [facility];  Service: Endoscopy;  Laterality: N/A;   POLYPECTOMY  07/26/2016   Procedure: POLYPECTOMY;  Surgeon:  [name], MD;  [facility];  Service: Endoscopy;;  colon   POLYPECTOMY  08/07/2021   Procedure: POLYPECTOMY;  Surgeon: Eloise Harman, DO;  [facility];  Service: Endoscopy;;   TRANSTHORACIC ECHOCARDIOGRAM  2011   EF 04-59%, stage 1 diastolic dysfunction, trace TR & pulm valve regurg    VASECTOMY  1989     Current Meds  Medication Sig   acetaminophen (TYLENOL) 500 MG tablet Take 1,000 mg by mouth every 6 (six) hours as needed for headache or moderate pain.   albuterol (PROVENTIL) (2.5 MG/3ML) 0.083% nebulizer solution Take 3 mLs (2.5 mg total) by nebulization every 2 (two) hours as needed for wheezing or shortness of breath (cough).   albuterol (PROVENTIL) (2.5 MG/3ML) 0.083% nebulizer solution Take 3 mLs (2.5 mg total) by nebulization every 4 (four) hours as needed for wheezing or shortness of breath.   ALPRAZolam (XANAX) 0.25 MG tablet Take 0.25 mg by mouth 2 (two) times daily as needed for sleep or anxiety.   amoxicillin-clavulanate (AUGMENTIN) 875-125 MG tablet Take 1 tablet by mouth 2 (two) times daily.   aspirin EC 81 MG tablet Take 1 tablet (81 mg total) by mouth daily with breakfast.   atorvastatin (LIPITOR) 40 MG tablet Take 40 mg  by mouth daily.   Blood Glucose Monitoring Suppl (ONETOUCH VERIO) w/Device KIT 2 (two) times daily.   budesonide-formoterol (SYMBICORT) 160-4.5 MCG/ACT inhaler Inhale 2 puffs into the lungs 2 (two) times daily.   Cholecalciferol (VITAMIN D3) 25 MCG (1000 UT) CAPS Take 1,000 Units by mouth daily.   [employer] VITAMIN C 250 MG tablet Take 250 mg by mouth daily.   diltiazem (CARDIZEM CD) 240 MG 24 hr capsule Take 240 mg by mouth daily.   ferrous sulfate 325 (65 FE) MG tablet Take 325 mg by mouth daily with breakfast.   fluticasone (FLONASE) 50 MCG/ACT nasal spray Place 1 spray into both nostrils daily as needed for allergies or rhinitis.   gabapentin (NEURONTIN) 300 MG capsule Take 1 capsule (300 mg total) by mouth at bedtime.    glimepiride (AMARYL) 1 MG tablet Take 1 mg by mouth daily with breakfast.   JARDIANCE 25 MG TABS tablet Take 25 mg by mouth every evening.    metFORMIN (GLUCOPHAGE) 1000 MG tablet Take 1,000 mg by mouth daily with supper.   Multiple Vitamin (MULTIVITAMIN WITH MINERALS) TABS tablet Take 1 tablet by mouth at bedtime.   ONETOUCH DELICA LANCETS 10F MISC    ONETOUCH VERIO test strip    OXYGEN Inhale 3 L into the lungs continuous.   pantoprazole (PROTONIX) 40 MG tablet TAKE 1 TABLET BY MOUTH EVERY DAY   PROAIR HFA 108 (90 Base) MCG/ACT inhaler Inhale 2 puffs into the lungs every 4 (four) hours as needed for wheezing or shortness of breath.   sertraline (ZOLOFT) 50 MG tablet Take 1 tablet (50 mg total) by mouth daily.   SitaGLIPtin-MetFORMIN HCl 9345671101 MG TB24 Take 1 tablet by mouth daily.   sodium chloride (OCEAN) 0.65 % SOLN nasal spray Place 1 spray into both nostrils as needed for congestion.   tamsulosin (FLOMAX) 0.4 MG CAPS capsule Take 0.4 mg by mouth daily.   thiamine 100 MG tablet Take 1 tablet (100 mg total) by mouth daily.   Tiotropium Bromide Monohydrate (SPIRIVA RESPIMAT) 2.5 MCG/ACT AERS Inhale 2 puffs into the lungs daily.   Wheat Dextrin (BENEFIBER DRINK MIX PO) Take by mouth.     Family History  Problem Relation Age of Onset   Heart disease Mother    Cancer Mother        lymph nodes   Diabetes Mother    Arthritis Other    Lung disease Other    Asthma Other    Kidney disease Other    Ovarian cancer Sister    Diabetes Brother    Heart disease Brother    Hypertension Brother    Diabetes Sister    Diabetes Sister    Cirrhosis Sister 30       no etoh   Colon cancer Neg Hx     Social History   Socioeconomic History   Marital status: Married    Spouse name: Katharine Look   Number of children: 1   Years of education: Not on file   Highest education level: Not on file  Occupational History   Not on file  Tobacco Use   Smoking status: Former    Packs/day: 1.25    Years:  36.00    Total pack years: 45.00    Types: Cigarettes    Quit date: 04/2011    Years since quitting: 10.8   Smokeless tobacco: Former  Scientific laboratory technician Use: Never used  Substance and Sexual Activity   Alcohol use: No   Drug  use: No   Sexual activity: Not Currently  Other Topics Concern   Not on file  Social History Narrative   Not on file   Social Determinants of Health   Financial Resource Strain: Low Risk  (08/16/2020)   Overall Financial Resource Strain (CARDIA)    Difficulty of Paying Living Expenses: Not hard at all  Food Insecurity: No Food Insecurity (08/16/2020)   Hunger Vital Sign    Worried About Running Out of Food in the Last Year: Never true    Ran Out of Food in the Last Year: Never true  Transportation Needs: No Transportation Needs (08/16/2020)   PRAPARE - Hydrologist (Medical): No    Lack of Transportation (Non-Medical): No  Physical Activity: Sufficiently Active (08/16/2020)   Exercise Vital Sign    Days of Exercise per Week: 7 days    Minutes of Exercise per Session: 30 min  Stress: No Stress Concern Present (08/16/2020)   [place]    Feeling of Stress : Not at all  Social Connections: Moderately Integrated (08/16/2020)   Social Connection and Isolation Panel [NHANES]    Frequency of Communication with Friends and Family: Three times a week    Frequency of Social Gatherings with Friends and Family: Three times a week    Attends Religious Services: More than 4 times per year    Active Member of Clubs or Organizations: No    Attends Archivist Meetings: Never    Marital Status: Married       Review of Systems: Gen: See HPI routine follow-up CV: Denies chest pain, palpitations. Resp: Admits to chronic shortness of breath and intermittent cough, some worsening recently in the setting of sinus infection. GI: see HPI Heme: See  HPI  Observations/Objective: No distress. Alert and oriented. Pleasant. Well nourished. Normal mood and affect.  Nasal cannula in place.  Unable to perform complete physical exam due to video encounter.    Assessment:  66 year old male with history of COPD with chronic respiratory failure on supplemental oxygen (3L), type 2 diabetes, HTN, GERD, biopsy-proven [place] fibrosis, elastography F2/F3, chronic thrombocytopenia following with hematology, splenomegaly, portal gastropathy, and currently being followed for routine cirrhosis care.  EGD in June 2022 with normal esophagus s/p dilation, portal hypertensive gastropathy, due for repeat in 2024.  Colonoscopy recently updated in January 2023, due for surveillance in 2028.  He is presenting today for routine follow-up.  [name] fibrosis/early cirrhosis: Biopsy-proven NASH fibrosis in 2018.  Has remained fairly well compensated to this point.  He does continue to bruise easily in the setting of thrombocytopenia.  No significant bleeding.  He has had several months of swelling in his right foot without change.  Denies erythema or soreness.  Venous ultrasound in January 2023 was negative for DVT.  Etiology unclear, but I do not suspect this is related to cirrhosis as he has no swelling in his left lower extremity or ascites.  No other signs or symptoms of decompensated disease. MELD Na 14 July 2021.  EGD up-to-date in June 2022 with no esophageal varices but evidence of portal hypertensive gastropathy, due for surveillance in 2024.  He has completed hepatitis A/B vaccine series.  He is scheduled for RUQ ultrasound today and will also have labs updated at that time.  GERD: Well-controlled on Protonix 40 mg daily.  No alarm symptoms.  Rectal bleeding: Chronic history of intermittent toilet tissue hematochezia in the setting of hemorrhoids.  Reports no recent symptoms after starting Benefiber daily.  Bowels are moving well.  We will continue to monitor for  recurrent symptoms and utilize Preparation H as needed.   Plan: Keep appointment for abdominal ultrasound today. CBC, BMP, HFP, INR, AFP. Nutrition: High-protein diet from a primarily plant-based diet. Avoid red meat.  No raw or undercooked meat, seafood, or shellfish. Low-fat/cholesterol/carbohydrate diet. Limit sodium to no more than 2000 mg/day including everything that you eat and drink. Continue Protonix 40 mg daily. Continue Benefiber daily. Use Preparation H as needed for rectal bleeding related to hemorrhoids. Recommended follow-up with PCP on swelling in right foot. Follow-up in office in 6 months.     I discussed the assessment and treatment plan with the patient. The patient was provided an opportunity to ask questions and all were answered. The patient agreed with the plan and demonstrated an understanding of the instructions.   The patient was advised to call back or seek an in-person evaluation if the symptoms worsen or if the condition fails to improve as anticipated.  I provided 7 minutes of video-face-to-face time during this encounter.  Addendum:  Received notification that additional documentation was needed to clarify the need for AFP. This patient has NASH fibrosis/early cirrhosis. He is undergoing routine [name] screening every 6 months with [place]. AFP is also part of [place] screening to correlate with US findings.   Aliene Altes, PA-C [facility]  01/26/2022

## 2022-01-26 ENCOUNTER — Encounter: Payer: Self-pay | Admitting: Gastroenterology

## 2022-01-26 ENCOUNTER — Ambulatory Visit (HOSPITAL_COMMUNITY)
Admission: RE | Admit: 2022-01-26 | Discharge: 2022-01-26 | Disposition: A | Discharge status: Home / Self Care | Payer: HMO | Source: Ambulatory Visit | Attending: Gastroenterology | Admitting: Gastroenterology

## 2022-01-26 ENCOUNTER — Other Ambulatory Visit (HOSPITAL_COMMUNITY)
Admission: RE | Admit: 2022-01-26 | Discharge: 2022-01-26 | Disposition: A | Discharge status: Home / Self Care | Payer: HMO | Source: Ambulatory Visit | Attending: Gastroenterology | Admitting: Gastroenterology

## 2022-01-26 ENCOUNTER — Telehealth (INDEPENDENT_AMBULATORY_CARE_PROVIDER_SITE_OTHER): Payer: PPO | Admitting: Gastroenterology

## 2022-01-26 ENCOUNTER — Telehealth: Payer: Self-pay | Admitting: *Deleted

## 2022-01-26 ENCOUNTER — Telehealth: Payer: Self-pay | Admitting: Gastroenterology

## 2022-01-26 VITALS — Ht 67.0 in | Wt 163.0 lb

## 2022-01-26 DIAGNOSIS — R161 Splenomegaly, not elsewhere classified: Secondary | ICD-10-CM | POA: Diagnosis not present

## 2022-01-26 DIAGNOSIS — K625 Hemorrhage of anus and rectum: Secondary | ICD-10-CM | POA: Insufficient documentation

## 2022-01-26 DIAGNOSIS — K219 Gastro-esophageal reflux disease without esophagitis: Secondary | ICD-10-CM | POA: Diagnosis not present

## 2022-01-26 DIAGNOSIS — Z1289 Encounter for screening for malignant neoplasm of other sites: Secondary | ICD-10-CM | POA: Insufficient documentation

## 2022-01-26 DIAGNOSIS — N281 Cyst of kidney, acquired: Secondary | ICD-10-CM | POA: Diagnosis not present

## 2022-01-26 DIAGNOSIS — K760 Fatty (change of) liver, not elsewhere classified: Secondary | ICD-10-CM | POA: Diagnosis not present

## 2022-01-26 DIAGNOSIS — K7581 Nonalcoholic steatohepatitis (NASH): Secondary | ICD-10-CM | POA: Insufficient documentation

## 2022-01-26 LAB — BASIC METABOLIC PANEL
Anion gap: 10 (ref 5–15)
BUN: 12 mg/dL (ref 8–23)
CO2: 27 mmol/L (ref 22–32)
Calcium: 8.9 mg/dL (ref 8.9–10.3)
Chloride: 103 mmol/L (ref 98–111)
Creatinine, Ser: 0.79 mg/dL (ref 0.61–1.24)
GFR, Estimated: >60 mL/min (ref >60)
Glucose, Bld: 103 mg/dL — ABNORMAL HIGH (ref 70–99)
Potassium: 3.8 mmol/L (ref 3.5–5.1)
Sodium: 140 mmol/L (ref 135–145)

## 2022-01-26 LAB — CBC
HCT: 47.3 % (ref 39.0–52.0)
Hemoglobin: 15.2 g/dL (ref 13.0–17.0)
MCH: 27.6 pg (ref 26.0–34.0)
MCHC: 32.1 g/dL (ref 30.0–36.0)
MCV: 86 fL (ref 80.0–100.0)
Platelets: 96 10*3/uL — ABNORMAL LOW (ref 150–400)
RBC: 5.5 MIL/uL (ref 4.22–5.81)
RDW: 15.3 % (ref 11.5–15.5)
WBC: 6.7 10*3/uL (ref 4.0–10.5)
nRBC: 0 % (ref 0.0–0.2)

## 2022-01-26 LAB — PROTIME-INR
INR: 1 (ref 0.8–1.2)
Prothrombin Time: 13.3 seconds (ref 11.4–15.2)

## 2022-01-26 LAB — HEPATIC FUNCTION PANEL
ALT: 34 U/L (ref 0–44)
AST: 21 U/L (ref 15–41)
Albumin: 4.4 g/dL (ref 3.5–5.0)
Alkaline Phosphatase: 99 U/L (ref 38–126)
Bilirubin, Direct: 0.1 mg/dL (ref 0.0–0.2)
Indirect Bilirubin: 0.7 mg/dL (ref 0.3–0.9)
Total Bilirubin: 0.8 mg/dL (ref 0.3–1.2)
Total Protein: 8.6 g/dL — ABNORMAL HIGH (ref 6.5–8.1)

## 2022-01-26 NOTE — Telephone Encounter (Signed)
Noted  

## 2022-01-26 NOTE — Telephone Encounter (Signed)
Gloriajean Dell, you are scheduled for a virtual visit with your provider today.  Just as we do with appointments in the office, we must obtain your consent to participate.  Your consent will be active for this visit and any virtual visit you may have with one of our providers in the next 365 days.  If you have a MyChart account, I can also send a copy of this consent to you electronically.  All virtual visits are billed to your insurance company just like a traditional visit in the office.  As this is a virtual visit, video technology does not allow for your provider to perform a traditional examination.  This may limit your provider's ability to fully assess your condition.  If your provider identifies any concerns that need to be evaluated in person or the need to arrange testing such as labs, EKG, etc, we will make arrangements to do so.  Although advances in technology are sophisticated, we cannot ensure that it will always work on either your end or our end.  If the connection with a video visit is poor, we may have to switch to a telephone visit.  With either a video or telephone visit, we are not always able to ensure that we have a secure connection.   I need to obtain your verbal consent now.   Are you willing to proceed with your visit today? Patient consents to virtual visit.

## 2022-01-26 NOTE — Telephone Encounter (Signed)
Courtney:  Patient needs labs completed at [facility].  I have placed orders.  He plans to have blood work completed today after his ultrasound.  Please be sure to fax orders to [facility].  Stacey:  Please arrange routine follow-up in 6 months.  Recommend in person visit.

## 2022-01-26 NOTE — Patient Instructions (Addendum)
Keep your appointment for your ultrasound at [facility] today.  Have labs completed at [facility] after your ultrasound.  Nutrition:  High-protein diet from a primarily plant-based diet. Avoid red meat.  No raw or undercooked meat, seafood, or shellfish. Low-fat/cholesterol/carbohydrate diet. Limit sodium to no more than 2000 mg/day including everything that you eat and drink.  Continue Protonix 40 mg daily 30 minutes before breakfast.  Continue Benefiber daily.  If you have any recurrent bleeding related to hemorrhoids, you may use Preparation H twice daily for 7-10 days.  Please follow-up with your primary care doctor on the persistent swelling in your right foot.  We will follow-up with you in 6 months.  Do not hesitate to call if you have any questions or concerns prior to your next visit.  It was great to see you again today!  Aliene Altes, PA-C [facility]

## 2022-01-27 LAB — AFP TUMOR MARKER: AFP, Serum, Tumor Marker: <1.8 ng/mL (ref 0.0–8.4)

## 2022-02-14 DIAGNOSIS — Z6826 Body mass index (BMI) 26.0-26.9, adult: Secondary | ICD-10-CM | POA: Diagnosis not present

## 2022-02-14 DIAGNOSIS — D692 Other nonthrombocytopenic purpura: Secondary | ICD-10-CM | POA: Diagnosis not present

## 2022-02-14 DIAGNOSIS — Z7982 Long term (current) use of aspirin: Secondary | ICD-10-CM | POA: Diagnosis not present

## 2022-02-14 DIAGNOSIS — J449 Chronic obstructive pulmonary disease, unspecified: Secondary | ICD-10-CM | POA: Diagnosis not present

## 2022-02-14 DIAGNOSIS — Z515 Encounter for palliative care: Secondary | ICD-10-CM | POA: Diagnosis not present

## 2022-02-14 DIAGNOSIS — Z87891 Personal history of nicotine dependence: Secondary | ICD-10-CM | POA: Diagnosis not present

## 2022-02-14 DIAGNOSIS — Z9981 Dependence on supplemental oxygen: Secondary | ICD-10-CM | POA: Diagnosis not present

## 2022-02-16 NOTE — Progress Notes (Signed)
This patient has NASH fibrosis/early cirrhosis. He is undergoing routine [name] screening every 6 months with [place]. AFP is also part of [place] screening to correlate with US findings.

## 2022-03-01 ENCOUNTER — Other Ambulatory Visit: Payer: Self-pay

## 2022-03-01 ENCOUNTER — Encounter (HOSPITAL_COMMUNITY): Payer: Self-pay | Admitting: Otolaryngology

## 2022-03-01 NOTE — Progress Notes (Signed)
Anesthesia note:  Bowel prep reminder:NA  PCP - Dr. Asencion Noble Cardiologist -none Other-   Chest x-ray - 11/30/21-epic EKG - 11/27/21-epic Stress Test - no ECHO - 12/27/17-epic Cardiac Cath - 2003  Pacemaker/ICD device last checked:NA  Sleep Study - no CPAP -   Fasting Blood Sugar - 120-200 Checks Blood Sugar _BID____  Blood Thinner:no stopped due to nose bleeds Blood Thinner Instructions: Aspirin Instructions: Last Dose:  Anesthesia review:   Patient denies shortness of breath, fever, cough and chest pain at PAT appointment Pt reports no SOB.  Patient verbalized understanding of instructions that were given to them at the PAT appointment. Patient was also instructed that they will need to review over the PAT instructions again at home before surgery. Yes I notified Pharmacy for a med rec.

## 2022-03-02 ENCOUNTER — Ambulatory Visit (HOSPITAL_BASED_OUTPATIENT_CLINIC_OR_DEPARTMENT_OTHER): Payer: HMO | Admitting: Anesthesiology

## 2022-03-02 ENCOUNTER — Encounter (HOSPITAL_COMMUNITY)
Admission: RE | Disposition: A | Discharge status: Home / Self Care | Payer: Self-pay | Source: Home / Self Care | Attending: Otolaryngology

## 2022-03-02 ENCOUNTER — Encounter (HOSPITAL_COMMUNITY): Payer: Self-pay | Admitting: Otolaryngology

## 2022-03-02 ENCOUNTER — Observation Stay (HOSPITAL_COMMUNITY)
Admission: RE | Admit: 2022-03-02 | Discharge: 2022-03-04 | Disposition: A | Discharge status: Home / Self Care | Payer: HMO | Attending: Otolaryngology | Admitting: Otolaryngology

## 2022-03-02 ENCOUNTER — Other Ambulatory Visit: Payer: Self-pay

## 2022-03-02 ENCOUNTER — Other Ambulatory Visit: Payer: Self-pay | Admitting: Internal Medicine

## 2022-03-02 ENCOUNTER — Ambulatory Visit (HOSPITAL_COMMUNITY): Payer: HMO | Admitting: Physician Assistant

## 2022-03-02 DIAGNOSIS — R7309 Other abnormal glucose: Secondary | ICD-10-CM | POA: Insufficient documentation

## 2022-03-02 DIAGNOSIS — E119 Type 2 diabetes mellitus without complications: Secondary | ICD-10-CM

## 2022-03-02 DIAGNOSIS — J449 Chronic obstructive pulmonary disease, unspecified: Secondary | ICD-10-CM | POA: Insufficient documentation

## 2022-03-02 DIAGNOSIS — J342 Deviated nasal septum: Secondary | ICD-10-CM

## 2022-03-02 DIAGNOSIS — J339 Nasal polyp, unspecified: Secondary | ICD-10-CM | POA: Diagnosis not present

## 2022-03-02 DIAGNOSIS — I10 Essential (primary) hypertension: Secondary | ICD-10-CM | POA: Diagnosis not present

## 2022-03-02 DIAGNOSIS — Z87891 Personal history of nicotine dependence: Secondary | ICD-10-CM | POA: Diagnosis not present

## 2022-03-02 DIAGNOSIS — J338 Other polyp of sinus: Secondary | ICD-10-CM | POA: Diagnosis not present

## 2022-03-02 DIAGNOSIS — J320 Chronic maxillary sinusitis: Secondary | ICD-10-CM | POA: Insufficient documentation

## 2022-03-02 DIAGNOSIS — J3489 Other specified disorders of nose and nasal sinuses: Secondary | ICD-10-CM | POA: Insufficient documentation

## 2022-03-02 DIAGNOSIS — J343 Hypertrophy of nasal turbinates: Secondary | ICD-10-CM

## 2022-03-02 DIAGNOSIS — J322 Chronic ethmoidal sinusitis: Secondary | ICD-10-CM | POA: Insufficient documentation

## 2022-03-02 DIAGNOSIS — F418 Other specified anxiety disorders: Secondary | ICD-10-CM | POA: Diagnosis not present

## 2022-03-02 DIAGNOSIS — J328 Other chronic sinusitis: Secondary | ICD-10-CM

## 2022-03-02 DIAGNOSIS — Z9889 Other specified postprocedural states: Secondary | ICD-10-CM

## 2022-03-02 DIAGNOSIS — J329 Chronic sinusitis, unspecified: Secondary | ICD-10-CM | POA: Diagnosis not present

## 2022-03-02 HISTORY — PX: ETHMOIDECTOMY: SHX5197

## 2022-03-02 HISTORY — PX: NASAL SEPTOPLASTY W/ TURBINOPLASTY: SHX2070

## 2022-03-02 HISTORY — PX: MAXILLARY ANTROSTOMY: SHX2003

## 2022-03-02 HISTORY — PX: SINUS ENDO WITH FUSION: SHX5329

## 2022-03-02 LAB — GLUCOSE, CAPILLARY
Glucose-Capillary: 114 mg/dL — ABNORMAL HIGH (ref 70–99)
Glucose-Capillary: 101 mg/dL — ABNORMAL HIGH (ref 70–99)
Glucose-Capillary: 163 mg/dL — ABNORMAL HIGH (ref 70–99)

## 2022-03-02 LAB — CBC
HCT: 44.9 % (ref 39.0–52.0)
Hemoglobin: 14.5 g/dL (ref 13.0–17.0)
MCH: 26.9 pg (ref 26.0–34.0)
MCHC: 32.3 g/dL (ref 30.0–36.0)
MCV: 83.1 fL (ref 80.0–100.0)
Platelets: 115 10*3/uL — ABNORMAL LOW (ref 150–400)
RBC: 5.4 MIL/uL (ref 4.22–5.81)
RDW: 14.5 % (ref 11.5–15.5)
WBC: 4.8 10*3/uL (ref 4.0–10.5)
nRBC: 0 % (ref 0.0–0.2)

## 2022-03-02 LAB — BASIC METABOLIC PANEL
Anion gap: 8 (ref 5–15)
BUN: 16 mg/dL (ref 8–23)
CO2: 25 mmol/L (ref 22–32)
Calcium: 9 mg/dL (ref 8.9–10.3)
Chloride: 107 mmol/L (ref 98–111)
Creatinine, Ser: 0.77 mg/dL (ref 0.61–1.24)
GFR, Estimated: >60 mL/min (ref >60)
Glucose, Bld: 122 mg/dL — ABNORMAL HIGH (ref 70–99)
Potassium: 3.8 mmol/L (ref 3.5–5.1)
Sodium: 140 mmol/L (ref 135–145)

## 2022-03-02 SURGERY — SEPTOPLASTY, NOSE, WITH NASAL TURBINATE REDUCTION
Anesthesia: General | Site: Nose

## 2022-03-02 MED ORDER — ORAL CARE MOUTH RINSE: 15.0000 mL | Freq: Once | OROMUCOSAL | Status: AC

## 2022-03-02 MED ORDER — 0.9 % SODIUM CHLORIDE (POUR BTL) OPTIME
TOPICAL | Status: DC | PRN
  Administered 2022-03-02: 1000 mL

## 2022-03-02 MED ORDER — OXYMETAZOLINE HCL 0.05 % NA SOLN
NASAL | Status: AC
  Filled 2022-03-02: qty 30

## 2022-03-02 MED ORDER — KCL IN DEXTROSE-NACL 20-5-0.45 MEQ/L-%-% IV SOLN
INTRAVENOUS | Status: DC
Start: 2022-03-02 — End: 2022-03-04
  Filled 2022-03-02 (×4): qty 1000

## 2022-03-02 MED ORDER — SITAGLIP PHOS-METFORMIN HCL ER 100-1000 MG PO TB24: 1.0000 | ORAL_TABLET | Freq: Every morning | ORAL | Status: DC

## 2022-03-02 MED ORDER — PANTOPRAZOLE SODIUM 40 MG PO TBEC
40.0000 mg | DELAYED_RELEASE_TABLET | Freq: Every morning | ORAL | Status: DC
  Administered 2022-03-03 – 2022-03-04 (×2): 40 mg via ORAL
  Filled 2022-03-02 (×2): qty 1

## 2022-03-02 MED ORDER — FERROUS SULFATE 325 (65 FE) MG PO TABS
325.0000 mg | ORAL_TABLET | Freq: Every morning | ORAL | Status: DC
  Administered 2022-03-03 – 2022-03-04 (×2): 325 mg via ORAL
  Filled 2022-03-02 (×2): qty 1

## 2022-03-02 MED ORDER — ALBUTEROL SULFATE (2.5 MG/3ML) 0.083% IN NEBU: 2.5000 mg | INHALATION_SOLUTION | RESPIRATORY_TRACT | Status: DC | PRN

## 2022-03-02 MED ORDER — ACETAMINOPHEN 650 MG RE SUPP: 650.0000 mg | RECTAL | Status: DC | PRN

## 2022-03-02 MED ORDER — ALBUTEROL SULFATE (2.5 MG/3ML) 0.083% IN NEBU
INHALATION_SOLUTION | RESPIRATORY_TRACT | Status: AC
  Administered 2022-03-02: 2.5 mg via RESPIRATORY_TRACT
  Filled 2022-03-02: qty 3

## 2022-03-02 MED ORDER — HYDROMORPHONE HCL 1 MG/ML IJ SOLN
0.2500 mg | INTRAMUSCULAR | Status: DC | PRN
  Administered 2022-03-02: 0.5 mg via INTRAVENOUS

## 2022-03-02 MED ORDER — AMISULPRIDE (ANTIEMETIC) 5 MG/2ML IV SOLN: 10.0000 mg | Freq: Once | INTRAVENOUS | Status: DC | PRN

## 2022-03-02 MED ORDER — CHLORHEXIDINE GLUCONATE 0.12 % MT SOLN
OROMUCOSAL | Status: AC
  Administered 2022-03-02: 15 mL via OROMUCOSAL
  Filled 2022-03-02: qty 15

## 2022-03-02 MED ORDER — OXYMETAZOLINE HCL 0.05 % NA SOLN
NASAL | Status: DC | PRN
  Administered 2022-03-02: 1

## 2022-03-02 MED ORDER — HYDROMORPHONE HCL 1 MG/ML IJ SOLN
INTRAMUSCULAR | Status: AC
  Filled 2022-03-02: qty 1

## 2022-03-02 MED ORDER — ONDANSETRON HCL 4 MG/2ML IJ SOLN
INTRAMUSCULAR | Status: AC
Start: 2022-03-02 — End: ?
  Filled 2022-03-02: qty 2

## 2022-03-02 MED ORDER — ONDANSETRON HCL 4 MG/2ML IJ SOLN: 4.0000 mg | Freq: Once | INTRAMUSCULAR | Status: DC | PRN

## 2022-03-02 MED ORDER — METFORMIN HCL 500 MG PO TABS
1000.0000 mg | ORAL_TABLET | Freq: Every day | ORAL | Status: DC
  Administered 2022-03-02 – 2022-03-03 (×2): 1000 mg via ORAL
  Filled 2022-03-02 (×2): qty 2

## 2022-03-02 MED ORDER — ONDANSETRON HCL 4 MG/2ML IJ SOLN
4.0000 mg | INTRAMUSCULAR | Status: DC | PRN
  Administered 2022-03-02 – 2022-03-03 (×3): 4 mg via INTRAVENOUS
  Filled 2022-03-02 (×3): qty 2

## 2022-03-02 MED ORDER — OXYCODONE-ACETAMINOPHEN 5-325 MG PO TABS
1.0000 | ORAL_TABLET | ORAL | Status: DC | PRN
  Filled 2022-03-02: qty 2

## 2022-03-02 MED ORDER — LACTATED RINGERS IV SOLN: INTRAVENOUS | Status: DC

## 2022-03-02 MED ORDER — ONDANSETRON HCL 4 MG PO TABS: 4.0000 mg | ORAL_TABLET | ORAL | Status: DC | PRN

## 2022-03-02 MED ORDER — DEXAMETHASONE SODIUM PHOSPHATE 10 MG/ML IJ SOLN
INTRAMUSCULAR | Status: DC | PRN
  Administered 2022-03-02: 10 mg via INTRAVENOUS

## 2022-03-02 MED ORDER — OXYCODONE HCL 5 MG PO TABS: 5.0000 mg | ORAL_TABLET | Freq: Once | ORAL | Status: DC | PRN

## 2022-03-02 MED ORDER — FENTANYL CITRATE (PF) 250 MCG/5ML IJ SOLN
INTRAMUSCULAR | Status: DC | PRN
  Administered 2022-03-02: 100 ug via INTRAVENOUS

## 2022-03-02 MED ORDER — SUGAMMADEX SODIUM 200 MG/2ML IV SOLN
INTRAVENOUS | Status: DC | PRN
  Administered 2022-03-02: 200 mg via INTRAVENOUS

## 2022-03-02 MED ORDER — ZOLPIDEM TARTRATE 5 MG PO TABS: 5.0000 mg | ORAL_TABLET | Freq: Every evening | ORAL | Status: DC | PRN

## 2022-03-02 MED ORDER — FENTANYL CITRATE (PF) 250 MCG/5ML IJ SOLN
INTRAMUSCULAR | Status: AC
  Filled 2022-03-02: qty 5

## 2022-03-02 MED ORDER — LIDOCAINE 2% (20 MG/ML) 5 ML SYRINGE
INTRAMUSCULAR | Status: DC | PRN
  Administered 2022-03-02: 60 mg via INTRAVENOUS

## 2022-03-02 MED ORDER — CEFAZOLIN SODIUM-DEXTROSE 2-4 GM/100ML-% IV SOLN
INTRAVENOUS | Status: AC
  Filled 2022-03-02: qty 100

## 2022-03-02 MED ORDER — ATORVASTATIN CALCIUM 40 MG PO TABS
40.0000 mg | ORAL_TABLET | Freq: Every morning | ORAL | Status: DC
  Administered 2022-03-02 – 2022-03-04 (×3): 40 mg via ORAL
  Filled 2022-03-02 (×3): qty 1

## 2022-03-02 MED ORDER — METFORMIN HCL 500 MG PO TABS
1000.0000 mg | ORAL_TABLET | Freq: Every day | ORAL | Status: DC
  Administered 2022-03-04: 1000 mg via ORAL
  Filled 2022-03-02 (×2): qty 2

## 2022-03-02 MED ORDER — TAMSULOSIN HCL 0.4 MG PO CAPS
0.4000 mg | ORAL_CAPSULE | Freq: Every morning | ORAL | Status: DC
  Administered 2022-03-03 – 2022-03-04 (×2): 0.4 mg via ORAL
  Filled 2022-03-02 (×2): qty 1

## 2022-03-02 MED ORDER — MORPHINE SULFATE (PF) 2 MG/ML IV SOLN
2.0000 mg | INTRAVENOUS | Status: DC | PRN
  Administered 2022-03-02 – 2022-03-03 (×2): 2 mg via INTRAVENOUS
  Filled 2022-03-02 (×2): qty 1

## 2022-03-02 MED ORDER — ALPRAZOLAM 0.25 MG PO TABS
0.2500 mg | ORAL_TABLET | Freq: Two times a day (BID) | ORAL | Status: DC | PRN
  Administered 2022-03-02 – 2022-03-04 (×3): 0.25 mg via ORAL
  Filled 2022-03-02 (×3): qty 1

## 2022-03-02 MED ORDER — OXYCODONE HCL 5 MG/5ML PO SOLN: 5.0000 mg | Freq: Once | ORAL | Status: DC | PRN

## 2022-03-02 MED ORDER — PROPOFOL 10 MG/ML IV BOLUS
INTRAVENOUS | Status: AC
  Filled 2022-03-02: qty 20

## 2022-03-02 MED ORDER — ROCURONIUM BROMIDE 10 MG/ML (PF) SYRINGE
PREFILLED_SYRINGE | INTRAVENOUS | Status: AC
  Filled 2022-03-02: qty 10

## 2022-03-02 MED ORDER — ALBUTEROL SULFATE (2.5 MG/3ML) 0.083% IN NEBU: 2.5000 mg | INHALATION_SOLUTION | Freq: Once | RESPIRATORY_TRACT | Status: AC

## 2022-03-02 MED ORDER — CEFAZOLIN SODIUM-DEXTROSE 2-3 GM-%(50ML) IV SOLR
INTRAVENOUS | Status: DC | PRN
  Administered 2022-03-02: 2 g via INTRAVENOUS

## 2022-03-02 MED ORDER — EMPAGLIFLOZIN 25 MG PO TABS
25.0000 mg | ORAL_TABLET | Freq: Every evening | ORAL | Status: DC
  Administered 2022-03-02 – 2022-03-03 (×2): 25 mg via ORAL
  Filled 2022-03-02 (×2): qty 1

## 2022-03-02 MED ORDER — CHLORHEXIDINE GLUCONATE 0.12 % MT SOLN: 15.0000 mL | Freq: Once | OROMUCOSAL | Status: DC

## 2022-03-02 MED ORDER — SERTRALINE HCL 50 MG PO TABS
50.0000 mg | ORAL_TABLET | Freq: Every morning | ORAL | Status: DC
  Administered 2022-03-03 – 2022-03-04 (×2): 50 mg via ORAL
  Filled 2022-03-02 (×2): qty 1

## 2022-03-02 MED ORDER — INSULIN ASPART 100 UNIT/ML IJ SOLN: 0.0000 [IU] | INTRAMUSCULAR | Status: DC | PRN

## 2022-03-02 MED ORDER — ROCURONIUM BROMIDE 10 MG/ML (PF) SYRINGE
PREFILLED_SYRINGE | INTRAVENOUS | Status: DC | PRN
  Administered 2022-03-02 (×2): 50 mg via INTRAVENOUS

## 2022-03-02 MED ORDER — ACETAMINOPHEN 160 MG/5ML PO SOLN
650.0000 mg | ORAL | Status: DC | PRN
  Administered 2022-03-03: 650 mg via ORAL
  Filled 2022-03-02: qty 20.3

## 2022-03-02 MED ORDER — LIDOCAINE-EPINEPHRINE 1 %-1:100000 IJ SOLN
INTRAMUSCULAR | Status: AC
  Filled 2022-03-02: qty 1

## 2022-03-02 MED ORDER — LINAGLIPTIN 5 MG PO TABS
5.0000 mg | ORAL_TABLET | Freq: Every day | ORAL | Status: DC
  Administered 2022-03-04: 5 mg via ORAL
  Filled 2022-03-02 (×2): qty 1

## 2022-03-02 MED ORDER — LIDOCAINE 2% (20 MG/ML) 5 ML SYRINGE
INTRAMUSCULAR | Status: AC
  Filled 2022-03-02: qty 5

## 2022-03-02 MED ORDER — SODIUM CHLORIDE 0.9 % IR SOLN
Status: DC | PRN
  Administered 2022-03-02: 1000 mL

## 2022-03-02 MED ORDER — ALBUMIN HUMAN 5 % IV SOLN: INTRAVENOUS | Status: DC | PRN

## 2022-03-02 MED ORDER — PROPOFOL 10 MG/ML IV BOLUS
INTRAVENOUS | Status: DC | PRN
  Administered 2022-03-02: 150 mg via INTRAVENOUS

## 2022-03-02 MED ORDER — ONDANSETRON HCL 4 MG/2ML IJ SOLN
INTRAMUSCULAR | Status: DC | PRN
  Administered 2022-03-02: 4 mg via INTRAVENOUS

## 2022-03-02 MED ORDER — DEXAMETHASONE SODIUM PHOSPHATE 10 MG/ML IJ SOLN
INTRAMUSCULAR | Status: AC
  Filled 2022-03-02: qty 1

## 2022-03-02 MED ORDER — ACETAMINOPHEN 500 MG PO TABS
ORAL_TABLET | ORAL | Status: AC
  Administered 2022-03-02: 1000 mg via ORAL
  Filled 2022-03-02: qty 2

## 2022-03-02 MED ORDER — BACITRACIN ZINC 500 UNIT/GM EX OINT
TOPICAL_OINTMENT | CUTANEOUS | Status: DC | PRN
  Administered 2022-03-02: 1 via TOPICAL

## 2022-03-02 MED ORDER — DILTIAZEM HCL ER COATED BEADS 240 MG PO CP24
240.0000 mg | ORAL_CAPSULE | Freq: Every morning | ORAL | Status: DC
  Administered 2022-03-03 – 2022-03-04 (×2): 240 mg via ORAL
  Filled 2022-03-02 (×2): qty 1

## 2022-03-02 MED ORDER — BACITRACIN-NEOMYCIN-POLYMYXIN OINTMENT TUBE
TOPICAL_OINTMENT | CUTANEOUS | Status: AC
  Filled 2022-03-02: qty 14.17

## 2022-03-02 MED ORDER — CHLORHEXIDINE GLUCONATE 0.12 % MT SOLN: 15.0000 mL | Freq: Once | OROMUCOSAL | Status: AC

## 2022-03-02 MED ORDER — BACITRACIN ZINC 500 UNIT/GM EX OINT
TOPICAL_OINTMENT | CUTANEOUS | Status: AC
  Filled 2022-03-02: qty 28.35

## 2022-03-02 MED ORDER — ORAL CARE MOUTH RINSE: 15.0000 mL | Freq: Once | OROMUCOSAL | Status: DC

## 2022-03-02 MED ORDER — GLIMEPIRIDE 1 MG PO TABS
1.0000 mg | ORAL_TABLET | Freq: Every day | ORAL | Status: DC
  Administered 2022-03-04: 1 mg via ORAL
  Filled 2022-03-02 (×2): qty 1

## 2022-03-02 MED ORDER — ACETAMINOPHEN 500 MG PO TABS: 1000.0000 mg | ORAL_TABLET | Freq: Once | ORAL | Status: AC

## 2022-03-02 MED ORDER — LIDOCAINE-EPINEPHRINE 1 %-1:100000 IJ SOLN
INTRAMUSCULAR | Status: DC | PRN
  Administered 2022-03-02: 4 mL

## 2022-03-02 MED ORDER — DEXMEDETOMIDINE (PRECEDEX) IN NS 20 MCG/5ML (4 MCG/ML) IV SYRINGE
PREFILLED_SYRINGE | INTRAVENOUS | Status: DC | PRN
  Administered 2022-03-02: 4 ug via INTRAVENOUS

## 2022-03-02 MED ORDER — MOMETASONE FURO-FORMOTEROL FUM 200-5 MCG/ACT IN AERO
2.0000 | INHALATION_SPRAY | Freq: Two times a day (BID) | RESPIRATORY_TRACT | Status: DC
  Filled 2022-03-02: qty 8.8

## 2022-03-02 MED ORDER — GABAPENTIN 300 MG PO CAPS
300.0000 mg | ORAL_CAPSULE | Freq: Every day | ORAL | Status: DC
Start: 2022-03-02 — End: 2022-03-04
  Administered 2022-03-02 – 2022-03-03 (×2): 300 mg via ORAL
  Filled 2022-03-02 (×2): qty 1

## 2022-03-02 SURGICAL SUPPLY — 59 items
AGENT HMST KT MTR STRL THRMB (HEMOSTASIS)
BAG COUNTER SPONGE SURGICOUNT (BAG) ×3 IMPLANT
BAG SPNG CNTER NS LX DISP (BAG) ×2
BLADE RAD40 ROTATE 4M 4 5PK (BLADE) IMPLANT
BLADE RAD60 ROTATE M4 4 5PK (BLADE) IMPLANT
BLADE ROTATE TRICUT 4X13 M4 (BLADE) ×3 IMPLANT
BLADE SURG 15 STRL LF DISP TIS (BLADE) IMPLANT
BLADE SURG 15 STRL SS (BLADE)
BLADE TRICUT ROTATE M4 4 5PK (BLADE) IMPLANT
CANISTER SUCT 3000ML PPV (MISCELLANEOUS) ×3 IMPLANT
CNTNR URN SCR LID CUP LEK RST (MISCELLANEOUS) ×3 IMPLANT
COAGULATOR SUCT SWTCH 10FR 6 (ELECTROSURGICAL) ×3 IMPLANT
CONT SPEC 4OZ STRL OR WHT (MISCELLANEOUS) ×2
COVER SURGICAL LIGHT HANDLE (MISCELLANEOUS) IMPLANT
DRAPE HALF SHEET 40X57 (DRAPES) IMPLANT
DRAPE SHEET LG 3/4 BI-LAMINATE (DRAPES) IMPLANT
DRESSING NASAL POPE 10X1.5X2.5 (GAUZE/BANDAGES/DRESSINGS) IMPLANT
DRSG NASAL POPE 10X1.5X2.5 (GAUZE/BANDAGES/DRESSINGS) ×4
DRSG NASOPORE 8CM (GAUZE/BANDAGES/DRESSINGS) IMPLANT
ELECT COATED BLADE 2.86 ST (ELECTRODE) IMPLANT
ELECT REM PT RETURN 9FT ADLT (ELECTROSURGICAL) ×2
ELECTRODE REM PT RTRN 9FT ADLT (ELECTROSURGICAL) ×3 IMPLANT
FILTER ARTHROSCOPY CONVERTOR (FILTER) ×3 IMPLANT
GAUZE 4X4 16PLY ~~LOC~~+RFID DBL (SPONGE) ×3 IMPLANT
GAUZE SPONGE 2X2 8PLY STRL LF (GAUZE/BANDAGES/DRESSINGS) ×3 IMPLANT
GAUZE SPONGE 4X4 12PLY STRL (GAUZE/BANDAGES/DRESSINGS) IMPLANT
GLOVE ECLIPSE 7.5 STRL STRAW (GLOVE) ×3 IMPLANT
GOWN STRL REUS W/ TWL LRG LVL3 (GOWN DISPOSABLE) ×6 IMPLANT
GOWN STRL REUS W/TWL LRG LVL3 (GOWN DISPOSABLE) ×4
KIT BASIN OR (CUSTOM PROCEDURE TRAY) ×3 IMPLANT
KIT TURNOVER KIT B (KITS) ×3 IMPLANT
NDL HYPO 25GX1X1/2 BEV (NEEDLE) ×3 IMPLANT
NDL SPNL 25GX3.5 QUINCKE BL (NEEDLE) ×3 IMPLANT
NEEDLE HYPO 25GX1X1/2 BEV (NEEDLE) ×2 IMPLANT
NEEDLE SPNL 25GX3.5 QUINCKE BL (NEEDLE) ×2 IMPLANT
NS IRRIG 1000ML POUR BTL (IV SOLUTION) ×3 IMPLANT
PACK EENT II TURBAN DRAPE (CUSTOM PROCEDURE TRAY) ×3 IMPLANT
PAD ARMBOARD 7.5X6 YLW CONV (MISCELLANEOUS) ×6 IMPLANT
PENCIL SMOKE EVACUATOR (MISCELLANEOUS) ×3 IMPLANT
POSITIONER HEAD DONUT 9IN (MISCELLANEOUS) ×3 IMPLANT
SOL ANTI FOG 6CC (MISCELLANEOUS) IMPLANT
SOLUTION ANTI FOG 6CC (MISCELLANEOUS)
SPLINT NASAL DOYLE BI-VL (GAUZE/BANDAGES/DRESSINGS) ×3 IMPLANT
SPONGE NEURO XRAY DETECT 1X3 (DISPOSABLE) ×3 IMPLANT
SURGIFLO W/THROMBIN 8M KIT (HEMOSTASIS) IMPLANT
SUT CHROMIC 4 0 SH 27 (SUTURE) ×3 IMPLANT
SUT PLAIN 4 0 ~~LOC~~ 1 (SUTURE) ×3 IMPLANT
SUT PROLENE 3 0 PS 2 (SUTURE) ×3 IMPLANT
SYR CONTROL 10ML LL (SYRINGE) IMPLANT
TOWEL GREEN STERILE FF (TOWEL DISPOSABLE) ×3 IMPLANT
TRACKER ENT INSTRUMENT (MISCELLANEOUS) ×3 IMPLANT
TRACKER ENT PATIENT (MISCELLANEOUS) ×3 IMPLANT
TRAY ENT MC OR (CUSTOM PROCEDURE TRAY) ×3 IMPLANT
TUBE CONNECTING 12X1/4 (SUCTIONS) ×3 IMPLANT
TUBE SALEM SUMP 16 FR W/ARV (TUBING) ×3 IMPLANT
TUBING EXTENTION W/L.L. (IV SETS) ×3 IMPLANT
TUBING STRAIGHTSHOT EPS 5PK (TUBING) ×3 IMPLANT
WATER STERILE IRR 1000ML POUR (IV SOLUTION) ×3 IMPLANT
YANKAUER SUCT BULB TIP NO VENT (SUCTIONS) ×3 IMPLANT

## 2022-03-02 NOTE — H&P (Signed)
Cc: Recurrent sinusitis, chronic nasal obstruction  HPI: The patient is a 66 year old male who returns today for his follow-up evaluation.  The patient was previously seen for recurrent sinusitis, chronic nasal obstruction, and recurrent epistaxis.  He was previously treated with multiple courses of antibiotics and allergy medications.  At his last visit, he was noted to have nasal mucosal congestion, severe nasal septal deviation, and bilateral inferior turbinate hypertrophy.  His CT scan showed bilateral chronic maxillary and ethmoid sinus disease, with narrowed right and occluded left ostiomeatal units.  His CT also showed marked rightward septal deviation.  The patient returns today complaining of persistent symptoms.  He continues to have severe difficulty breathing through his nose.  He also complains of frequent facial pressure and pain.  He is interested in more definitive treatment.  Exam: General: Communicates without difficulty, well nourished, no acute distress. Head: Normocephalic, no evidence injury, no tenderness, facial buttresses intact without stepoff. Face/sinus: No tenderness to palpation and percussion. Facial movement is normal and symmetric. Eyes: PERRL, EOMI. No scleral icterus, conjunctivae clear. Neuro: CN II exam reveals vision grossly intact.  No nystagmus at any point of gaze. Ears: Auricles well formed without lesions.  Ear canals are intact without mass or lesion.  No erythema or edema is appreciated.  The TMs are intact without fluid. Nose: External evaluation reveals normal support and skin without lesions.  Dorsum is intact.  Anterior rhinoscopy reveals congested mucosa over anterior aspect of inferior turbinates and deviated septum.  No purulence noted. Oral:  Oral cavity and oropharynx are intact, symmetric, without erythema or edema.  Mucosa is moist without lesions. Neck: Full range of motion without pain.  There is no significant lymphadenopathy.  No masses palpable.   Thyroid bed within normal limits to palpation.  Parotid glands and submandibular glands equal bilaterally without mass.  Trachea is midline. Neuro:  CN 2-12 grossly intact. Gait normal. A flexible scope was inserted into the right nasal cavity.  Endoscopy of the interior nasal cavity, superior, inferior, and middle meatus was performed. The sphenoid-ethmoid recess was examined.  Polypoid tissue was noted to obstruct the middle meatus.  Diffuse edematous mucosa was noted.  Severe nasal septal deviation noted. Nasopharynx was clear.  Turbinates were hypertrophied but without mass. The procedure was repeated on the contralateral side with similar findings.  The patient tolerated the procedure well.  Assessment: 1.  Bilateral chronic maxillary and ethmoid sinusitis, with occlusion of bilateral ostiomeatal complexes. 2.  Diffuse nasal mucosal congestion, severe nasal septal deviation, and bilateral inferior turbinate hypertrophy.   Plan: 1.  The nasal endoscopy findings and the CT images are reviewed with the patient. 2.  Continue with Flonase nasal spray and nasal saline irrigation daily. 3.  In light of his persistent symptoms, the patient may benefit from surgical intervention with septoplasty, turbinate reduction, and bilateral endoscopic sinus surgery to address his maxillary and ethmoid diseases. 4.  The risk, benefits, and details of the procedures discussed.  Questions are invited and answered. 5.  The patient would like to proceed with the procedures.

## 2022-03-02 NOTE — Op Note (Signed)
DATE OF PROCEDURE: 03/02/2022  OPERATIVE REPORT   SURGEON: Leta Baptist, MD   PREOPERATIVE DIAGNOSES:  1. Severe nasal septal deviation.  2. Bilateral inferior turbinate hypertrophy.  3. Chronic nasal obstruction. 4. Bilateral chronic maxillary and ethmoid sinusitis/polyposis.  POSTOPERATIVE DIAGNOSES:  1. Severe nasal septal deviation.  2. Bilateral inferior turbinate hypertrophy.  3. Chronic nasal obstruction. 4. Bilateral chronic maxillary and ethmoid sinusitis/polyposis.  PROCEDURE PERFORMED:  1. Septoplasty.  2. Bilateral partial inferior turbinate resection.  3.  Bilateral endoscopic total ethmoidectomy. 4.  Bilateral endoscopic maxillary antrostomy with polyp removal. 5.  FUSION stereotactic image guidance.  ANESTHESIA: General endotracheal tube anesthesia.   COMPLICATIONS: None.   ESTIMATED BLOOD LOSS: 600 mL.   INDICATION FOR PROCEDURE: [name] is a 66 y.o. male with a history of recurrent sinusitis, chronic nasal obstruction, and recurrent epistaxis.  He was previously treated with multiple courses of antibiotics, steroids, and allergy medications.  At his last visit, he was noted to have nasal mucosal congestion, severe nasal septal deviation, and bilateral inferior turbinate hypertrophy.  His CT scan showed bilateral chronic maxillary and ethmoid sinus disease, with narrowed right and occluded left ostiomeatal units.  His CT also showed marked rightward septal deviation.  The patient continued to complain of persistent symptoms.  He continued to have severe difficulty breathing through his nose.  He also complained of frequent facial pressure and pain. Based on the above findings, the decision was made for the patient to undergo the above-stated procedures. The risks, benefits, alternatives, and details of the procedures were discussed with the patient. Questions were invited and answered. Informed consent was obtained.   DESCRIPTION OF PROCEDURE: The patient was taken  to the operating room and placed supine on the operating table. General endotracheal tube anesthesia was administered by the anesthesiologist. The patient was positioned, and prepped and draped in the standard fashion for nasal surgery. Pledgets soaked with Afrin were placed in both nasal cavities for decongestion. The pledgets were subsequently removed. The FUSION stereotactic image guidance marker was placed. The image guidance system was functional throughout the case.  Examination of the nasal cavity revealed a severe nasal septal deviation. 1% lidocaine with 1:100,000 epinephrine was injected onto the nasal septum bilaterally. A hemitransfixion incision was made on the left side. The mucosal flap was carefully elevated on the left side. A cartilaginous incision was made 1 cm superior to the caudal margin of the nasal septum. Mucosal flap was also elevated on the right side in the similar fashion. It should be noted that due to the severe septal deviation, the deviated portion of the cartilaginous and bony septum had to be removed in piecemeal fashion. Once the deviated portions were removed, a straight midline septum was achieved. The septum was then quilted with 4-0 plain gut sutures. The hemitransfixion incision was closed with interrupted 4-0 chromic sutures.   The inferior one half of both hypertrophied inferior turbinate was crossclamped with a Kelly clamp. The inferior one half of each inferior turbinate was then resected with a pair of cross cutting scissors. Hemostasis was achieved with a suction cautery device.   Using a 0 endoscope, the left nasal cavity was examined. A large middle turbinate was noted. Using Tru-Cut forceps, the inferior one third of the middle turbinate was resected. Polypoid tissue was noted within the middle meatus. The polypoid tissue was removed using a combination of microdebrider and Blakesley forceps. The uncinate process was resected with a freer elevator. The  maxillary antrum was entered  and enlarged using a combination of backbiter and microdebrider. Polypoid tissue was removed from the left maxillary sinus.  Attention was then focused on the ethmoid sinuses. The bony partitions of the anterior and posterior ethmoid cavities were taken down. Polypoid tissue was noted and removed.  The sinuses were copiously irrigated with saline solution.  The same procedure was repeated on the right side without exception. More polyps were noted on the right side. All polyps were removed.  Nasal pore packing was applied to the ethmoid cavities bilaterally.  The care of the patient was turned over to the anesthesiologist. The patient was awakened from anesthesia without difficulty. The patient was extubated and transferred to the recovery room in good condition.   OPERATIVE FINDINGS: Severe nasal septal deviation and bilateral inferior turbinate hypertrophy.  Bilateral chronic maxillary and ethmoid sinusitis and polyposis.  SPECIMEN: Bilateral sinus contents.   FOLLOWUP CARE: The patient be observed overnight due to his oxygen requirement.  He has a history of severe COPD.  He will most likely be discharged home on postop day #1.   Joel Mericle Raynelle Bring, MD

## 2022-03-02 NOTE — Anesthesia Procedure Notes (Signed)
Procedure Name: Intubation Date/Time: 03/02/2022 2:05 PM  Performed by: Maude Leriche, CRNAPre-anesthesia Checklist: Patient identified, Emergency Drugs available, Suction available and Patient being monitored Patient Re-evaluated:Patient Re-evaluated prior to induction Oxygen Delivery Method: Circle system utilized Preoxygenation: Pre-oxygenation with 100% oxygen Induction Type: IV induction Ventilation: Mask ventilation without difficulty Laryngoscope Size: Miller and 2 Grade View: Grade I Tube type: Oral Tube size: 7.5 mm Number of attempts: 1 Airway Equipment and Method: Stylet Placement Confirmation: ETT inserted through vocal cords under direct vision, positive ETCO2 and breath sounds checked- equal and bilateral Secured at: 24 cm Tube secured with: Tape Dental Injury: Teeth and Oropharynx as per pre-operative assessment

## 2022-03-02 NOTE — Transfer of Care (Signed)
Immediate Anesthesia Transfer of Care Note  Patient: [name]  Procedure(s) Performed: NASAL SEPTOPLASTY WITH TURBINATE REDUCTION (Bilateral: Nose) ETHMOIDECTOMY (Bilateral: Nose) MAXILLARY ANTROSTOMY (Bilateral: Nose) SINUS ENDO WITH FUSION (Nose)  Patient Location: PACU  Anesthesia Type:General  Level of Consciousness: awake, alert  and oriented  Airway & Oxygen Therapy: Patient Spontanous Breathing and Patient connected to face mask oxygen  Post-op Assessment: Report given to RN and Post -op Vital signs reviewed and stable  Post vital signs: Reviewed and stable  Last Vitals:  Vitals Value Taken Time  BP 121/61 03/02/22 1615  Temp    Pulse 91 03/02/22 1618  Resp 12 03/02/22 1618  SpO2 94 % 03/02/22 1618  Vitals shown include unvalidated device data.  Last Pain:  Vitals:   03/02/22 1139  TempSrc:   PainSc: 0-No pain         Complications: No notable events documented.

## 2022-03-02 NOTE — Discharge Instructions (Signed)

## 2022-03-03 DIAGNOSIS — J342 Deviated nasal septum: Secondary | ICD-10-CM | POA: Diagnosis not present

## 2022-03-03 LAB — GLUCOSE, CAPILLARY
Glucose-Capillary: 145 mg/dL — ABNORMAL HIGH (ref 70–99)
Glucose-Capillary: 128 mg/dL — ABNORMAL HIGH (ref 70–99)
Glucose-Capillary: 190 mg/dL — ABNORMAL HIGH (ref 70–99)
Glucose-Capillary: 231 mg/dL — ABNORMAL HIGH (ref 70–99)

## 2022-03-03 MED ORDER — AMOXICILLIN 875 MG PO TABS: 875.0000 mg | ORAL_TABLET | Freq: Two times a day (BID) | ORAL | 0 refills | Status: AC

## 2022-03-03 MED ORDER — OXYCODONE-ACETAMINOPHEN 5-325 MG PO TABS: 1.0000 | ORAL_TABLET | ORAL | 0 refills | Status: AC | PRN

## 2022-03-03 MED ORDER — SODIUM CHLORIDE 0.9 % IV SOLN
INTRAVENOUS | Status: DC
Start: 2022-03-03 — End: 2022-03-04

## 2022-03-03 NOTE — Plan of Care (Signed)
  Problem: Pain Managment: Goal: General experience of comfort will improve Outcome: Progressing   Problem: Skin Integrity: Goal: Risk for impaired skin integrity will decrease Outcome: Progressing   

## 2022-03-03 NOTE — Discharge Summary (Signed)
Physician Discharge Summary  Patient ID: [name] MRN: [id number] DOB/AGE: 07-Nov-1955 66 y.o.  Admit date: 03/02/2022 Discharge date: 03/03/2022  Admission Diagnoses: Chronic sinusitis, chronic nasal obstruction  Discharge Diagnoses: Chronic sinusitis, chronic nasal obstruction Principal Problem:   S/P functional endoscopic sinus surgery   Discharged Condition: good  Hospital Course: Pt had an uneventful overnight stay. Oxygen stable. No stridor. Nasal bleeding has decreased.  Consults: None  Significant Diagnostic Studies: None  Treatments: surgery: Sinus surgery, septoplasty  Discharge Exam: Blood pressure 103/60, pulse 96, temperature 98.3 F (36.8 C), temperature source Oral, resp. rate 17, height 5' 7"  (1.702 m), weight 72.1 kg, SpO2 92 %.    Disposition: Discharge disposition: 01-Home or Self Care       Discharge Instructions     Activity as tolerated - No restrictions   Complete by: As directed    Diet general   Complete by: As directed    No wound care   Complete by: As directed       Allergies as of 03/03/2022       Reactions   Aspirin Other (See Comments)   Epistaxis    Lioresal [baclofen] Other (See Comments)   Lethargy     Zocor [simvastatin] Nausea Only        Medication List     STOP taking these medications    amoxicillin-clavulanate 875-125 MG tablet Commonly known as: AUGMENTIN   aspirin EC 81 MG tablet       TAKE these medications    acetaminophen 500 MG tablet Commonly known as: TYLENOL Take 1,000 mg by mouth every 6 (six) hours as needed for headache or moderate pain.   albuterol 108 (90 Base) MCG/ACT inhaler Commonly known as: VENTOLIN HFA Inhale 2 puffs into the lungs every 6 (six) hours as needed for shortness of breath.   albuterol (2.5 MG/3ML) 0.083% nebulizer solution Commonly known as: PROVENTIL Take 3 mLs (2.5 mg total) by nebulization every 2 (two) hours as needed for wheezing or shortness of breath  (cough).   ALPRAZolam 0.25 MG tablet Commonly known as: XANAX Take 0.25 mg by mouth 2 (two) times daily as needed for sleep or anxiety.   amoxicillin 875 MG tablet Commonly known as: AMOXIL Take 1 tablet (875 mg total) by mouth 2 (two) times daily for 3 days.   atorvastatin 40 MG tablet Commonly known as: LIPITOR Take 40 mg by mouth in the morning.   diltiazem 240 MG 24 hr capsule Commonly known as: CARDIZEM CD Take 240 mg by mouth in the morning.   ferrous sulfate 325 (65 FE) MG tablet Take 325 mg by mouth in the morning.   fluticasone 50 MCG/ACT nasal spray Commonly known as: FLONASE Place 1 spray into both nostrils daily as needed for allergies or rhinitis.   gabapentin 300 MG capsule Commonly known as: NEURONTIN Take 1 capsule (300 mg total) by mouth at bedtime.   glimepiride 1 MG tablet Commonly known as: AMARYL Take 1 mg by mouth daily with breakfast.   Jardiance 25 MG Tabs tablet Generic drug: empagliflozin Take 25 mg by mouth every evening.   metFORMIN 1000 MG tablet Commonly known as: GLUCOPHAGE Take 1,000 mg by mouth daily with supper.   multivitamin with minerals Tabs tablet Take 1 tablet by mouth at bedtime.   nystatin 100000 UNIT/ML suspension Commonly known as: MYCOSTATIN Take 5 mLs by mouth 4 (four) times daily as needed (thrush).   oxyCODONE-acetaminophen 5-325 MG tablet Commonly known as: Percocet Take 1  tablet by mouth every 4 (four) hours as needed for up to 3 days for severe pain.   OXYGEN Inhale 3 L into the lungs continuous.   pantoprazole 40 MG tablet Commonly known as: PROTONIX TAKE 1 TABLET BY MOUTH EVERY DAY What changed: when to take this   sertraline 50 MG tablet Commonly known as: ZOLOFT Take 1 tablet (50 mg total) by mouth daily. What changed: when to take this   SitaGLIPtin-MetFORMIN HCl [id number] MG Tb24 Take 1 tablet by mouth in the morning.   Spiriva Respimat 2.5 MCG/ACT Aers Generic drug: Tiotropium Bromide  Monohydrate INHALE 2 PUFFS BY MOUTH INTO THE LUNGS DAILY What changed: See the new instructions.   Symbicort 160-4.5 MCG/ACT inhaler Generic drug: budesonide-formoterol INHALE 2 PUFFS INTO THE LUNGS TWICE A DAY What changed: See the new instructions.   tamsulosin 0.4 MG Caps capsule Commonly known as: FLOMAX Take 0.4 mg by mouth in the morning.   thiamine 100 MG tablet Commonly known as: VITAMIN B1 Take 1 tablet (100 mg total) by mouth daily. What changed: when to take this   vitamin C 250 MG tablet Commonly known as: ASCORBIC ACID Take 250 mg by mouth in the morning.   Vitamin D3 25 MCG (1000 UT) Caps Take 1,000 Units by mouth in the morning.        Follow-up Information     Leta Baptist, MD Follow up on 03/05/2022.   Specialty: Otolaryngology Why: at [employer] information: [address] [place] [address] [phone]                 Signed: Burley Saver 03/03/2022, 7:39 AM

## 2022-03-03 NOTE — Anesthesia Postprocedure Evaluation (Signed)
Anesthesia Post Note  Patient: [name]  Procedure(s) Performed: NASAL SEPTOPLASTY WITH TURBINATE REDUCTION (Bilateral: Nose) ETHMOIDECTOMY (Bilateral: Nose) MAXILLARY ANTROSTOMY (Bilateral: Nose) SINUS ENDO WITH FUSION (Nose)     Patient location during evaluation: PACU Anesthesia Type: General Level of consciousness: awake and alert Pain management: pain level controlled Vital Signs Assessment: post-procedure vital signs reviewed and stable Respiratory status: spontaneous breathing, nonlabored ventilation, respiratory function stable and patient connected to nasal cannula oxygen Cardiovascular status: blood pressure returned to baseline and stable Postop Assessment: no apparent nausea or vomiting Anesthetic complications: no   No notable events documented.  Last Vitals:  Vitals:   03/03/22 0221 03/03/22 0433  BP: 105/66 103/60  Pulse: 89 96  Resp: 16 17  Temp: 36.5 C 36.8 C  SpO2: 90% 92%    Last Pain:  Vitals:   03/03/22 0433  TempSrc: Oral  PainSc:                  [place]

## 2022-03-03 NOTE — TOC Initial Note (Signed)
Transition of Care [facility]) - Initial/Assessment Note    Patient Details  Name: [name] MRN: [id number] Date of Birth: March 11, 1956  Transition [facility]) CM/SW Contact:    Bartholomew Crews, RN Phone Number: [phone] 03/03/2022, 3:47 PM  Clinical Narrative:                  Spoke with patient and spouse at the bedside. DC discontinued d/t patient needing improved symptom management. Spouse expressed concerns about nausea and unable to keep oral intake down. Spouse is hopeful that patient will be able to transition home tomorrow. Portable oxygen tank at bedside. Home oxygen provided by [employer]. Spouse to provide transportation. No TOC needs identified at this time.   Expected Discharge Plan: Home/Self Care Barriers to Discharge: Continued Medical Work up   Patient Goals and CMS Choice Patient states their goals for this hospitalization and ongoing recovery are:: home with wife when nausea improves and can tolerate oral intake CMS Medicare.gov Compare Post Acute Care list provided to:: Patient Choice offered to / list presented to : NA  Expected Discharge Plan and Services Expected Discharge Plan: Home/Self Care In-house Referral: NA Discharge Planning Services: CM Consult     Expected Discharge Date: 03/03/22               DME Arranged: N/A DME Agency: NA       HH Arranged: NA HH Agency: NA        Prior Living Arrangements/Services   Lives with:: Self, Spouse Patient language and need for interpreter reviewed:: Yes        Need for Family Participation in Patient Care: Yes (Comment) Care giver support system in place?: Yes (comment)   Criminal Activity/Legal Involvement Pertinent to Current Situation/Hospitalization: No - Comment as needed  Activities of Daily Living Home Assistive Devices/Equipment: Oxygen, Eyeglasses, CBG Meter, Blood pressure cuff, Cane (specify quad or straight), Wheelchair, Environmental consultant (specify type), Shower chair without back ADL Screening  (condition at time of admission) Patient's cognitive ability adequate to safely complete daily activities?: Yes Is the patient deaf or have difficulty hearing?: No Does the patient have difficulty seeing, even when wearing glasses/contacts?: No Does the patient have difficulty concentrating, remembering, or making decisions?: No Patient able to express need for assistance with ADLs?: Yes Does the patient have difficulty dressing or bathing?: No Independently performs ADLs?: Yes (appropriate for developmental age) Does the patient have difficulty walking or climbing stairs?: Yes (can go up flight of stairs, just has to go slow) Weakness of Legs: Right Weakness of Arms/Hands: None  Permission Sought/Granted Permission sought to share information with : Family Supports    Share Information with NAME: sandra     Permission granted to share info w Relationship: wife     Emotional Assessment Appearance:: Appears stated age Attitude/Demeanor/Rapport: Engaged Affect (typically observed): Accepting Orientation: : Oriented to Self, Oriented to Place, Oriented to  Time, Oriented to Situation Alcohol / Substance Use: Not Applicable Psych Involvement: No (comment)  Admission diagnosis:  S/P functional endoscopic sinus surgery [Z98.890] Patient Active Problem List   Diagnosis Date Noted   S/P functional endoscopic sinus surgery 03/02/2022   Hemorrhoids 07/20/2021   Rectal bleeding 07/20/2021   DOE (dyspnea on exertion) 06/06/2021   Dysphagia 10/12/2020   Acute on chronic respiratory failure ([place]) 09/03/2020   Septic shock ([place]) 08/18/2020   Lobar pneumonia ([place]) 05/25/2020   Community acquired pneumonia of left lower lobe of lung 05/23/2020   Acute on chronic respiratory failure  with hypoxia ([place]) 05/23/2020   Chronic respiratory failure with hypoxia ([name]) 02/04/2020   Upper airway cough syndrome 12/10/2019   Hives 07/30/2018   Full body hives 07/29/2018   Idiopathic angioedema  07/29/2018   COPD GOLD III 07/29/2018   Mycobacterium avium infection ([place]) 12/18/2017   Goiter 10/25/2017   Suspected Mycobacterial infectious disease 10/25/2017   COPD with acute exacerbation ([place]) 10/19/2017   Dehydration 10/19/2017   NASH (nonalcoholic steatohepatitis) 08/05/2017   Liver fibrosis 01/31/2017   H/O adenomatous polyp of colon 07/05/2016   Hyperproteinemia 09/05/2014   HTN (hypertension) 10/14/2013   Dyslipidemia 10/14/2013   Type 2 diabetes mellitus without complication, without long-term current use of insulin ([name]) 10/14/2013   Leukopenia 08/08/2013   Thrombocytopenia ([place]) 08/08/2013   Lower abdominal pain 07/06/2013   Abnormal LFTs 07/06/2013   Gastroesophageal reflux disease 07/06/2013   Unspecified constipation 07/06/2013   Abdominal bloating 04/21/2013   Abdominal pain, generalized 57/50/5183   Non-alcoholic fatty liver disease 04/21/2013   Splenomegaly 04/21/2013   Lumbago 11/26/2011   Muscle weakness (generalized) 11/26/2011   Bilateral leg pain 11/06/2011   Back pain with radiation 11/06/2011   DDD (degenerative disc disease), lumbar 11/06/2011   PCP:  Asencion Noble, MD Pharmacy:   [id number] [place] [address]Phone: [phone]: [phone]    Social Determinants of Health (SDOH) Interventions    Readmission Risk Interventions    06/12/2020    4:29 PM  Readmission Risk Prevention Plan  Transportation Screening Complete  HRI or Home Care Consult Complete  Social Work Consult for [place]/Counseling Complete  Palliative Care Screening Not Applicable  Medication Review (Press photographer Complete

## 2022-03-03 NOTE — Plan of Care (Signed)

## 2022-03-03 NOTE — Care Management Obs Status (Signed)
[place] NOTIFICATION   Patient Details  Name: JABARIE POP MRN: [id number] Date of Birth: [birth year]   Medicare Observation Status Notification Given:  Yes    Bartholomew Crews, RN 03/03/2022, 3:45 PM

## 2022-03-03 NOTE — Plan of Care (Signed)
  Problem: Nutrition: Goal: Adequate nutrition will be maintained Outcome: Progressing   Problem: Pain Managment: Goal: General experience of comfort will improve Outcome: Progressing   Problem: Safety: Goal: Ability to remain free from injury will improve Outcome: Progressing   

## 2022-03-03 NOTE — Progress Notes (Signed)
Medicated x2 for complaints of nose/head pain, partial effects noted. Xanax prn given at hs for sleep-effective. Nasal packing changed x4 this shift, with drainage decreased this am. Zofran given x2 for reports of nausea-effective. O2 sats maintaining between 92-95%, O2 decreased to 5 liters via mask. Report given to Dr. Benjamine Mola this am, pt pending discharge, primary nurse made aware.

## 2022-03-04 ENCOUNTER — Encounter (HOSPITAL_COMMUNITY): Payer: Self-pay | Admitting: Otolaryngology

## 2022-03-04 DIAGNOSIS — J342 Deviated nasal septum: Secondary | ICD-10-CM | POA: Diagnosis not present

## 2022-03-04 LAB — GLUCOSE, CAPILLARY: Glucose-Capillary: 216 mg/dL — ABNORMAL HIGH (ref 70–99)

## 2022-03-04 MED ORDER — HYDROCODONE-ACETAMINOPHEN 5-325 MG PO TABS: 1.0000 | ORAL_TABLET | Freq: Four times a day (QID) | ORAL | 0 refills | Status: AC | PRN

## 2022-03-04 NOTE — Plan of Care (Signed)
  Problem: Education: Goal: Knowledge of General Education information will improve Description: Including pain rating scale, medication(s)/side effects and non-pharmacologic comfort measures 03/04/2022 1022 by Santa Lighter, RN Outcome: Adequate for Discharge 03/04/2022 1022 by Santa Lighter, RN Outcome: Progressing   Problem: Health Behavior/Discharge Planning: Goal: Ability to manage health-related needs will improve 03/04/2022 1022 by Santa Lighter, RN Outcome: Adequate for Discharge 03/04/2022 1022 by Santa Lighter, RN Outcome: Progressing   Problem: Clinical Measurements: Goal: Ability to maintain clinical measurements within normal limits will improve Outcome: Adequate for Discharge Goal: Will remain free from infection Outcome: Adequate for Discharge Goal: Diagnostic test results will improve Outcome: Adequate for Discharge Goal: Respiratory complications will improve Outcome: Adequate for Discharge Goal: Cardiovascular complication will be avoided Outcome: Adequate for Discharge   Problem: Activity: Goal: Risk for activity intolerance will decrease Outcome: Adequate for Discharge   Problem: Nutrition: Goal: Adequate nutrition will be maintained Outcome: Adequate for Discharge   Problem: Coping: Goal: Level of anxiety will decrease Outcome: Adequate for Discharge   Problem: Elimination: Goal: Will not experience complications related to bowel motility Outcome: Adequate for Discharge Goal: Will not experience complications related to urinary retention Outcome: Adequate for Discharge   Problem: Pain Managment: Goal: General experience of comfort will improve Outcome: Adequate for Discharge   Problem: Safety: Goal: Ability to remain free from injury will improve Outcome: Adequate for Discharge   Problem: Skin Integrity: Goal: Risk for impaired skin integrity will decrease Outcome: Adequate for Discharge

## 2022-03-04 NOTE — Progress Notes (Signed)
Nsg Discharge Note  Admit Date:  03/02/2022 Discharge date: 03/04/2022   [name] to be D/C'd Home per MD order.  AVS completed.   Removed IV-CDI. Reviewed d/c paperwork with patient and wife. Answered all questions. NT wheeled stable patient and belongings to main entrance where he was picked up by his wife.Patient/caregiver able to verbalize understanding.  Discharge Medication: Allergies as of 03/04/2022       Reactions   Aspirin Other (See Comments)   Epistaxis    Lioresal [baclofen] Other (See Comments)   Lethargy     Zocor [simvastatin] Nausea Only        Medication List     STOP taking these medications    amoxicillin-clavulanate 875-125 MG tablet Commonly known as: AUGMENTIN   aspirin EC 81 MG tablet       TAKE these medications    acetaminophen 500 MG tablet Commonly known as: TYLENOL Take 1,000 mg by mouth every 6 (six) hours as needed for headache or moderate pain.   albuterol 108 (90 Base) MCG/ACT inhaler Commonly known as: VENTOLIN HFA Inhale 2 puffs into the lungs every 6 (six) hours as needed for shortness of breath.   albuterol (2.5 MG/3ML) 0.083% nebulizer solution Commonly known as: PROVENTIL Take 3 mLs (2.5 mg total) by nebulization every 2 (two) hours as needed for wheezing or shortness of breath (cough).   ALPRAZolam 0.25 MG tablet Commonly known as: XANAX Take 0.25 mg by mouth 2 (two) times daily as needed for sleep or anxiety.   amoxicillin 875 MG tablet Commonly known as: AMOXIL Take 1 tablet (875 mg total) by mouth 2 (two) times daily for 3 days.   atorvastatin 40 MG tablet Commonly known as: LIPITOR Take 40 mg by mouth in the morning.   diltiazem 240 MG 24 hr capsule Commonly known as: CARDIZEM CD Take 240 mg by mouth in the morning.   ferrous sulfate 325 (65 FE) MG tablet Take 325 mg by mouth in the morning.   fluticasone 50 MCG/ACT nasal spray Commonly known as: FLONASE Place 1 spray into both nostrils daily as needed  for allergies or rhinitis.   gabapentin 300 MG capsule Commonly known as: NEURONTIN Take 1 capsule (300 mg total) by mouth at bedtime.   glimepiride 1 MG tablet Commonly known as: AMARYL Take 1 mg by mouth daily with breakfast.   HYDROcodone-acetaminophen 5-325 MG tablet Commonly known as: NORCO/VICODIN Take 1 tablet by mouth every 6 (six) hours as needed for up to 3 days for severe pain.   Jardiance 25 MG Tabs tablet Generic drug: empagliflozin Take 25 mg by mouth every evening.   metFORMIN 1000 MG tablet Commonly known as: GLUCOPHAGE Take 1,000 mg by mouth daily with supper.   multivitamin with minerals Tabs tablet Take 1 tablet by mouth at bedtime.   nystatin 100000 UNIT/ML suspension Commonly known as: MYCOSTATIN Take 5 mLs by mouth 4 (four) times daily as needed (thrush).   oxyCODONE-acetaminophen 5-325 MG tablet Commonly known as: Percocet Take 1 tablet by mouth every 4 (four) hours as needed for up to 3 days for severe pain.   OXYGEN Inhale 3 L into the lungs continuous.   pantoprazole 40 MG tablet Commonly known as: PROTONIX TAKE 1 TABLET BY MOUTH EVERY DAY What changed: when to take this   sertraline 50 MG tablet Commonly known as: ZOLOFT Take 1 tablet (50 mg total) by mouth daily. What changed: when to take this   SitaGLIPtin-MetFORMIN HCl [phone] MG Tb24 Take  1 tablet by mouth in the morning.   Spiriva Respimat 2.5 MCG/ACT Aers Generic drug: Tiotropium Bromide Monohydrate INHALE 2 PUFFS BY MOUTH INTO THE LUNGS DAILY What changed: See the new instructions.   Symbicort 160-4.5 MCG/ACT inhaler Generic drug: budesonide-formoterol INHALE 2 PUFFS INTO THE LUNGS TWICE A DAY What changed: See the new instructions.   tamsulosin 0.4 MG Caps capsule Commonly known as: FLOMAX Take 0.4 mg by mouth in the morning.   thiamine 100 MG tablet Commonly known as: VITAMIN B1 Take 1 tablet (100 mg total) by mouth daily. What changed: when to take this    vitamin C 250 MG tablet Commonly known as: ASCORBIC ACID Take 250 mg by mouth in the morning.   Vitamin D3 25 MCG (1000 UT) Caps Take 1,000 Units by mouth in the morning.        Discharge Assessment: Vitals:   03/04/22 0641 03/04/22 0826  BP: 121/62 (!) 101/54  Pulse: 79 88  Resp:  17  Temp:  (!) 97.5 F (36.4 C)  SpO2:  97%   Skin clean, dry and intact without evidence of skin break down, no evidence of skin tears noted. IV catheter discontinued intact. Site without signs and symptoms of complications - no redness or edema noted at insertion site, patient denies c/o pain - only slight tenderness at site.  Dressing with slight pressure applied.  D/c Instructions-Education: Discharge instructions given to patient/family with verbalized understanding. D/c education completed with patient/family including follow up instructions, medication list, d/c activities limitations if indicated, with other d/c instructions as indicated by MD - patient able to verbalize understanding, all questions fully answered. Patient instructed to return to ED, call 911, or call MD for any changes in condition.  Patient escorted via [place], and D/C home via private auto.  Santa Lighter, RN 03/04/2022 11:27 AM

## 2022-03-04 NOTE — Plan of Care (Signed)
  Problem: Education: Goal: Knowledge of General Education information will improve Description Including pain rating scale, medication(s)/side effects and non-pharmacologic comfort measures Outcome: Progressing   Problem: Health Behavior/Discharge Planning: Goal: Ability to manage health-related needs will improve Outcome: Progressing   

## 2022-03-05 DIAGNOSIS — J320 Chronic maxillary sinusitis: Secondary | ICD-10-CM | POA: Diagnosis not present

## 2022-03-05 DIAGNOSIS — J322 Chronic ethmoidal sinusitis: Secondary | ICD-10-CM | POA: Diagnosis not present

## 2022-03-05 DIAGNOSIS — J338 Other polyp of sinus: Secondary | ICD-10-CM | POA: Diagnosis not present

## 2022-03-06 LAB — SURGICAL PATHOLOGY

## 2022-03-09 ENCOUNTER — Ambulatory Visit: Payer: HMO | Admitting: Internal Medicine

## 2022-03-23 DIAGNOSIS — J320 Chronic maxillary sinusitis: Secondary | ICD-10-CM | POA: Diagnosis not present

## 2022-03-23 DIAGNOSIS — J338 Other polyp of sinus: Secondary | ICD-10-CM | POA: Diagnosis not present

## 2022-03-23 DIAGNOSIS — J322 Chronic ethmoidal sinusitis: Secondary | ICD-10-CM | POA: Diagnosis not present

## 2022-03-27 DIAGNOSIS — E1140 Type 2 diabetes mellitus with diabetic neuropathy, unspecified: Secondary | ICD-10-CM | POA: Diagnosis not present

## 2022-04-03 DIAGNOSIS — E1140 Type 2 diabetes mellitus with diabetic neuropathy, unspecified: Secondary | ICD-10-CM | POA: Diagnosis not present

## 2022-04-03 DIAGNOSIS — R7309 Other abnormal glucose: Secondary | ICD-10-CM | POA: Diagnosis not present

## 2022-04-03 DIAGNOSIS — I10 Essential (primary) hypertension: Secondary | ICD-10-CM | POA: Diagnosis not present

## 2022-04-03 DIAGNOSIS — Z23 Encounter for immunization: Secondary | ICD-10-CM | POA: Diagnosis not present

## 2022-04-03 DIAGNOSIS — J440 Chronic obstructive pulmonary disease with acute lower respiratory infection: Secondary | ICD-10-CM | POA: Diagnosis not present

## 2022-04-05 ENCOUNTER — Encounter: Payer: Self-pay | Admitting: Internal Medicine

## 2022-04-05 ENCOUNTER — Ambulatory Visit: Payer: HMO | Admitting: Internal Medicine

## 2022-04-05 DIAGNOSIS — J449 Chronic obstructive pulmonary disease, unspecified: Secondary | ICD-10-CM

## 2022-04-05 DIAGNOSIS — J9611 Chronic respiratory failure with hypoxia: Secondary | ICD-10-CM

## 2022-04-05 MED ORDER — PREDNISONE 10 MG PO TABS: ORAL_TABLET | ORAL | 0 refills | Status: DC

## 2022-04-05 MED ORDER — BREZTRI AEROSPHERE 160-9-4.8 MCG/ACT IN AERO: 2.0000 | INHALATION_SPRAY | Freq: Two times a day (BID) | RESPIRATORY_TRACT | 11 refills | Status: DC

## 2022-04-05 MED ORDER — FAMOTIDINE 20 MG PO TABS: ORAL_TABLET | ORAL | Status: DC

## 2022-04-05 NOTE — Progress Notes (Signed)
Gloriajean Dell, male    DOB: Jan 14, 1956     MRN: [id number]   Brief patient profile:  70 yowm MM/quit smoking 2012 with COPD GOLD III criteria 06/17/20 previously followed by Dr [name] referred back to pulmonary clinic 12/09/2019 in [place].   History of Present Illness  12/09/2019  Pulmonary/ 1st office eval/Gilliam Hawkes  symbicort 160/spiriva  Chief Complaint  Patient presents with   Pulmonary Consult    Former patient of Dr [name].  Breathing has been worse over the past few wks. Recently treated with Doxy per PCP for COPD exacerbation. He is occ coughing up some green sputum.   flare 11/23/19  rx doxy 5/19 x 7 days  Dyspnea:  Baseline uses HC parking / MMRC3 = can't walk 100 yards even at a slow pace at a flat grade s stopping due to sob  On 2lpm  Cough: improved less purulent purulent  Sleep: wakes up coughing since onset /sometimes uses saba proair  SABA use: rarely daytime rec Continue protonix 40 mg Take 30-60 min before first meal of the day  GERD diet  Plan A = Automatic = Always=    Budesonide/formoterol (symbicort ) 2 puffs first thing in am and 12 hours later and use spiriva x 2 pffs after the morning symbicort (ok to change back to the powder after 2 weeks but will need to stop it if you start coughing  Work on inhaler technique  Plan B = Backup (to supplement plan A, not to replace it) Only use your albuterol inhaler as a rescue medication    Plan C = Crisis (instead of Plan B   Make sure you check your oxygen saturations at highest level of activity to be sure it stays over 90%   Prednisone 10 mg take  4 each am x 2 days,   2 each am x 2 days,  1 each am x 2 days and stop  For cough > mucinex dm 1200 mg every 12 hours as needed  Would wait another 2 weeks before you get your first shot for covid 19    02/03/2020  f/u ov/Tamaj Jurgens re: GOLD III/ 02 dep/ rx symb /spriva powder  Chief Complaint  Patient presents with   Acute Visit    shortness of breath with exertion, productive  cough with green phlegm  Dyspnea: abruptly worse x 48 h = MMRC3 = can't walk 100 yards even at a slow pace at a flat grade s stopping due to sob   Cough: worse with green mucus/ worse since changed back to dpi spiriva Sleeping: 2 am tends to cramp SABA use: none on day of ov  02: 2lpm  Up to 2.5 lpm  rec zpak Prednisone 10 mg take  4 each am x 2 days,   2 each am x 2 days,  1 each am x 2 days and stop  Change spiriva to respimat 2 puff each am  For cough mucinex dm up to 1200 mg every 12 hours and supplement hydocodone every 4 hours as needed  Get your covid vaccination asap We will call [employer] for a best fit analysis for portable 0xygen  In meantime, Make sure you check your oxygen saturations at highest level of activity to be sure it stays over 90% and adjust upward to maintain this level if needed but remember to turn it back to previous settings when you stop (to conserve your supply).    [name] eval 08/01/20 rec ST eval done 08/05/20: low risk  asp SLP Diet Recommendations Regular solids;Thin liquid  Liquid Administration via Straw;Cup  Medication Administration Whole meds with liquid  Compensations Slow rate;Small sips/bites  Postural Changes Seated upright at 90 degrees;Remain semi-upright after after feeds/meals (Comment)    06/06/2021  f/u ov/[place] office/Audi Wettstein re: GOLD 3/ 02 dep / maint on spiriva/ symbicort 160   Chief Complaint  Patient presents with   Follow-up    Feels breathing has worsened since last OV.  3lpm cont. At home 2.5 lpm cont. When out of house to conserve O2  Dyspnea:  sob at rest on 3lpm 24/7  Cough: slt green then clear  Sleeping: bed blocks / 3 pillows new  SABA use: 2-3 x per weeks/ no neb  02: 3lpm 24/7  Covid status: vax x 3  Lung cancer screening: scheduled for 06/28/21  Chills s sweats x 24 h Recurrent swelling R > L  Fleeting L cp cleared up p levoquin Rec Plan A = Automatic = Always=    Symbicort Take 2 puffs first thing in am and then  another 2 puffs about 12 hours later and spiriva respimat 2 puffs after am symbicort  Plan B = Backup (to supplement plan A, not to replace it) Only use your albuterol inhaler as a rescue medication  Plan C = Crisis (instead of Plan B but only if Plan B stops working) - only use your albuterol nebulizer if you first try Plan B and it fails to help Make sure you check your oxygen saturation  at your highest level of activity  to be sure it stays over 90%  Zpak  Prednisone 10 mg take  4 each am x 2 days,   2 each am x 2 days,  1 each am x 2 days and stop Please schedule a follow up visit in 3 months but call sooner if needed       11/29/21 vertigo vomiting >  ER 5/24 rx zpak and missed surgery on 12/02/22 rescheduled for Aug 2023 for chronic severe L nasal congestion with very small osteo      12/08/2021  post f/u ov/[place] office/Delayna Sparlin re: worse cough p zpak maint on symb/spiriva   Chief Complaint  Patient presents with   Hospitalization Follow-up    Ed for pneumonia at [facility] on 5/25. Still coughing up green sputum and having SOB   Aches and vomiting resolved/ ha gone, still can't breathe thru L nose  Dyspnea:  still able to get to mb and back on 3lpm  Cough: honking cough/ robitussin and flutter not helping> purulent sputum   Sleeping: cough disturbs sleeps SABA use: only used hfa  once or twice  in last 24 h prior to OV  / doesn't have neb  Covid status: x 3 vax  Rec For cough > mucinex dm 1200 mg every 12 hours / use flutter valve as much as you can and add Pepcid 20 mg one hour before bed  I emphasized that nasal steroids  like flose have no immediate benefit I  Augmentin 875 mg take one pill twice daily  X 10 days   Prednisone 10 mg take  4 each am x 2 days,   2 each am x 2 days,  1 each am x 2 days and stop  Make sure you check your oxygen saturation  AT  your highest level of activity (not after you stop)   to be sure it stays over 90%   Remember to use PROAIR up to every 4  hours  if needed and if not helpful > nebulizer  up to every 4 hours if needed    [name] 03/02/22 FESS   04/05/2022  f/u ov/[place] office/Danalee Flath re: GOLD 3 copd maint on symb/spiriva = 240 dollars Chief Complaint  Patient presents with   Follow-up    Saw Dr. Benjamine Mola and had surgery and is doing much better with his breathing    Dyspnea:  mailbox and back using 02 sometimes/  Cough: overall better since sinus surgery  Sleeping: noct better on bed blocks  SABA use: much less hfa/ never neb  02: 3lpm hs / sitting  and walks off it   Lung cancer screening: scheduled    No obvious day to day or daytime variability or assoc excess/ purulent sputum or mucus plugs or hemoptysis or cp or chest tightness, subjective wheeze or overt sinus or hb symptoms.   Sleeping  without nocturnal  or early am exacerbation  of respiratory  c/o's or need for noct saba. Also denies any obvious fluctuation of symptoms with weather or environmental changes or other aggravating or alleviating factors except as outlined above   No unusual exposure hx or h/o childhood pna/ asthma or knowledge of premature birth.  Current Allergies, Complete Past Medical History, Past Surgical History, Family History, and Social History were reviewed in [employer] record.  ROS  The following are not active complaints unless bolded Hoarseness, sore throat, dysphagia, dental problems, itching, sneezing,  nasal congestion or discharge of excess mucus or purulent secretions, ear ache,   fever, chills, sweats, unintended wt loss or wt gain, classically pleuritic or exertional cp,  orthopnea pnd or arm/hand swelling  or leg swelling, presyncope, palpitations, abdominal pain, anorexia, nausea, vomiting, diarrhea  or change in bowel habits or change in bladder habits, change in stools or change in urine, dysuria, hematuria,  rash, arthralgias, visual complaints, headache, numbness, weakness or ataxia or problems with walking or  coordination,  change in mood or  memory.        Current Meds  Medication Sig   acetaminophen (TYLENOL) 500 MG tablet Take 1,000 mg by mouth every 6 (six) hours as needed for headache or moderate pain.   albuterol (PROVENTIL) (2.5 MG/3ML) 0.083% nebulizer solution Take 3 mLs (2.5 mg total) by nebulization every 2 (two) hours as needed for wheezing or shortness of breath (cough).   albuterol (VENTOLIN HFA) 108 (90 Base) MCG/ACT inhaler Inhale 2 puffs into the lungs every 6 (six) hours as needed for shortness of breath.   ALPRAZolam (XANAX) 0.25 MG tablet Take 0.25 mg by mouth 2 (two) times daily as needed for sleep or anxiety.   amoxicillin (AMOXIL) 875 MG tablet Take 875 mg by mouth 2 (two) times daily.   atorvastatin (LIPITOR) 40 MG tablet Take 40 mg by mouth in the morning.   Cholecalciferol (VITAMIN D3) 25 MCG (1000 UT) CAPS Take 1,000 Units by mouth in the morning.   diltiazem (CARDIZEM CD) 240 MG 24 hr capsule Take 240 mg by mouth in the morning.   ferrous sulfate 325 (65 FE) MG tablet Take 325 mg by mouth in the morning.   fluticasone (FLONASE) 50 MCG/ACT nasal spray Place 1 spray into both nostrils daily as needed for allergies or rhinitis.   gabapentin (NEURONTIN) 300 MG capsule Take 1 capsule (300 mg total) by mouth at bedtime.   glimepiride (AMARYL) 1 MG tablet Take 1 mg by mouth daily with breakfast.   JARDIANCE 25 MG TABS tablet  Take 25 mg by mouth every evening.    metFORMIN (GLUCOPHAGE) 1000 MG tablet Take 1,000 mg by mouth daily with supper.   Multiple Vitamin (MULTIVITAMIN WITH MINERALS) TABS tablet Take 1 tablet by mouth at bedtime.   nystatin (MYCOSTATIN) 100000 UNIT/ML suspension Take 5 mLs by mouth 4 (four) times daily as needed (thrush).   OXYGEN Inhale 3 L into the lungs continuous.   pantoprazole (PROTONIX) 40 MG tablet TAKE 1 TABLET BY MOUTH EVERY DAY (Patient taking differently: Take 40 mg by mouth in the morning.)   sertraline (ZOLOFT) 50 MG tablet Take 1 tablet (50  mg total) by mouth daily. (Patient taking differently: Take 50 mg by mouth in the morning.)   SitaGLIPtin-MetFORMIN HCl [phone] MG TB24 Take 1 tablet by mouth in the morning.   SYMBICORT 160-4.5 MCG/ACT inhaler INHALE 2 PUFFS INTO THE LUNGS TWICE A DAY   tamsulosin (FLOMAX) 0.4 MG CAPS capsule Take 0.4 mg by mouth in the morning.   thiamine 100 MG tablet Take 1 tablet (100 mg total) by mouth daily. (Patient taking differently: Take 100 mg by mouth in the morning.)   Tiotropium Bromide Monohydrate (SPIRIVA RESPIMAT) 2.5 MCG/ACT AERS INHALE 2 PUFFS BY MOUTH INTO THE LUNGS DAILY   vitamin C (ASCORBIC ACID) 250 MG tablet Take 250 mg by mouth in the morning.                   Past Medical History:  Diagnosis Date   Anxiety    Asthma    Chest pain    a. 2003 Cath: nl cors.   COPD (chronic obstructive pulmonary disease) ([place])    Depression    Diabetes mellitus    Dyslipidemia    Dysrhythmia    Fracture 12/15   lower back "L-1"   GERD (gastroesophageal reflux disease)    Hypertension    PONV (postoperative nausea and vomiting)    Splenomegaly        Objective:    Wt   04/05/2022        156 12/08/2021          164 09/01/2021        167  06/06/2021      162 12/08/2020          163 09/15/2020        154  05/23/2020      166 04/28/2020      166   12/30/19 162 lb (73.5 kg)  12/21/19 162 lb (73.5 kg)  12/09/19 162 lb (73.5 kg)   Vital signs reviewed  04/05/2022  - Note at rest 02 sats  97% on 3lpm cont but did fine on RA x 300 ft   General appearance:    amb slt hoarse wm / congested soundin cough       HEENT :  Oropharynx  clear/ full upper denture  Nasal turbinates nl    NECK :  without JVD/Nodes/TM/ nl carotid upstrokes bilaterally   LUNGS: no acc muscle use,  Mod barrel  contour chest wall with bilateral  min exp rhonchi  s audible wheeze and  without cough on insp or exp maneuvers and mod  Hyperresonant  to  percussion bilaterally     CV:  RRR  no s3 or murmur or  increase in P2, and no edema   ABD:  soft and nontender with pos mid insp Hoover's  in the supine position. No bruits or organomegaly appreciated, bowel sounds nl  MS:   Ext  warm without deformities or   obvious joint restrictions , calf tenderness, cyanosis or clubbing  SKIN: warm and dry without lesions    NEURO:  alert, approp, nl sensorium with  no motor or cerebellar deficits apparent.               Assessment

## 2022-04-05 NOTE — Patient Instructions (Addendum)
Plan A = Automatic = Always=    Breztri Take 2 puffs first thing in am and then another 2 puffs about 12 hours later.     Plan B = Backup (to supplement plan A, not to replace it) Only use your albuterol inhaler as a rescue medication to be used if you can't catch your breath by resting or doing a relaxed purse lip breathing pattern.  - The less you use it, the better it will work when you need it. - Ok to use the inhaler up to 2 puffs  every 4 hours if you must but call for appointment if use goes up over your usual need - Don't leave home without it !!  (think of it like the spare tire for your car)   Plan C = Crisis (instead of Plan B but only if Plan B stops working) - only use your albuterol nebulizer if you first try Plan B and it fails to help > ok to use the nebulizer up to every 4 hours but if start needing it regularly call for immediate appointment   Prednisone 10 mg take  4 each am x 2 days,   2 each am x 2 days,  1 each am x 2 days and stop   For cough mucinex dm 1200 mg every 12 hours as needed   Pantoprazole (protonix) 40 mg   Take  30-60 min before first meal of the day and Pepcid (famotidine)  20 mg (OTC) after supper until return to office - this is the best way to tell whether stomach acid is contributing to your problem.    GERD (REFLUX)  is an extremely common cause of respiratory symptoms just like yours , many times with no obvious heartburn at all.    It can be treated with medication, but also with lifestyle changes including elevation of the head of your bed (ideally with 6 -8inch blocks under the headboard of your bed),  Smoking cessation, avoidance of late meals, excessive alcohol, and avoid fatty foods, chocolate, peppermint, colas, red wine, and acidic juices such as orange juice.  NO MINT OR MENTHOL PRODUCTS SO NO COUGH DROPS  USE SUGARLESS CANDY INSTEAD (Jolley ranchers or Stover's or Life Savers) or even ice chips will also do - the key is to swallow to prevent  all throat clearing. NO OIL BASED VITAMINS - use powdered substitutes.  Avoid fish oil when coughing.  If cough not better add another gabapentin 300 mg in am    Make sure you check your oxygen saturation  AT  your highest level of activity (not after you stop)   to be sure it stays over 90% and adjust  02 flow upward to maintain this level if needed but remember to turn it back to previous settings when you stop (to conserve your supply).   Please schedule a follow up office visit in 6 weeks, call sooner if needed

## 2022-04-06 ENCOUNTER — Encounter: Payer: Self-pay | Admitting: Internal Medicine

## 2022-04-06 NOTE — Assessment & Plan Note (Addendum)
Started on 02 around 2019 As of 02/03/2020  2lpm 24/7  -  02/03/2020 referred to [facility] for Best fit for amb 02 > 3lpm pulsed tank -  04/28/2020   Walked 3lpm pulsed tank  approx   400 ft  @ moderate pace  stopped due to  Sob/tired  with sats still 95%    -  09/15/2020   Walked 3lpm cont approx   200 ft  @ slow/slt undsteady  pace  stopped due to  Sob/fatigue   With sats 91% at rest sats were 86% RA   12/08/2020   Walked RA  approx   500 ft  @ moderate pace  stopped due to  End of study, a little sob, sats still 90%  - 04/05/2022 patient walked at a moderate pace on room air all three laps @ 150 ft each. Desat to 87% at end of third lap. Patient placed back on 3LO2 cont at end of walk   rec 3lpm hs and with heavy exertion titrate to keep > 90%   Each maintenance medication was reviewed in detail including emphasizing most importantly the difference between maintenance and prns and under what circumstances the prns are to be triggered using an action plan format where appropriate.  Total time for H and P, chart review, counseling, reviewing hfa/neb/02 device(s) , directly observing portions of ambulatory 02 saturation study/ and generating customized AVS unique to this office visit / same day charting = 34 min

## 2022-04-06 NOTE — Assessment & Plan Note (Addendum)
Quit smoking 2012 - alpha one AT screen  01/31/17  MM  Level 169 - PFT's  06/18/2019  FEV1 1.12 (35 % ) ratio 0.46  p 0 % improvement from saba p ? prior to study with DLCO  11.91 (48%) corrects to 3.18 (754%)  for alv volume and FV curve classic concavity   - 12/09/2019  After extensive coaching inhaler device,  effectiveness =    90% with smi so try spiriva respimat 2 bid instead of DPI  - 09/15/2020  After extensive coaching inhaler device,  effectiveness =    75% > breztri trial > preferreed sym/ spiriva smi - 12/08/2020  After extensive coaching inhaler device,  effectiveness = 80% - [name] 03/02/22 FESS     Better p sinus surgery but still some residual rhonchi > rec Prednisone 10 mg take  4 each am x 2 days,   2 each am x 2 days,  1 each am x 2 days and stop  - continue gerd rx/ diet    Group D (now reclassified as E) in terms of symptom/risk and laba/lama/ICS  therefore appropriate rx at this point >>>  Try breztri 2 bid to see if just as effective and cheaper if not resume symb/spiriva   04/05/2022  After extensive coaching inhaler device,  effectiveness =    75%   ABC action planned reviewed- see avs for instructions unique to this ov

## 2022-04-16 DIAGNOSIS — J320 Chronic maxillary sinusitis: Secondary | ICD-10-CM | POA: Diagnosis not present

## 2022-04-16 DIAGNOSIS — J322 Chronic ethmoidal sinusitis: Secondary | ICD-10-CM | POA: Diagnosis not present

## 2022-04-16 DIAGNOSIS — J338 Other polyp of sinus: Secondary | ICD-10-CM | POA: Diagnosis not present

## 2022-05-16 ENCOUNTER — Telehealth: Payer: Self-pay | Admitting: Internal Medicine

## 2022-05-16 NOTE — Telephone Encounter (Signed)
Patient came in to [facility] office this morning with complaints of:   Chills, vomiting, body aches, nausea and increased fatigue.  May have had fever but states he didn't check his temp.  Has not taken a flu or covid test  Denies increased SOB, wheezing.   Dr. Melvyn Novas please advise, patient wants to know if he should still come in for appt tomorrow at 8:45 and also if we can send something in for him.

## 2022-05-16 NOTE — Telephone Encounter (Signed)
ATC patient. LVMTCB. 

## 2022-05-16 NOTE — Telephone Encounter (Signed)
Sorry to hear that but he should definitely cancel, need to to UC or ER for flu and covid testing asap

## 2022-05-16 NOTE — Telephone Encounter (Signed)
Called and notified patient of recommendations from Dr. Melvyn Novas. He voiced understanding. Cancelled appt for tomorrow 11/9. Patient will call back to reschedule

## 2022-05-17 ENCOUNTER — Ambulatory Visit: Payer: HMO | Admitting: Internal Medicine

## 2022-05-18 ENCOUNTER — Inpatient Hospital Stay (HOSPITAL_COMMUNITY): Payer: HMO | Attending: Hematology

## 2022-05-25 ENCOUNTER — Inpatient Hospital Stay: Payer: HMO | Admitting: Physician Assistant

## 2022-06-11 ENCOUNTER — Other Ambulatory Visit: Payer: Self-pay | Admitting: Gastroenterology

## 2022-06-11 DIAGNOSIS — K219 Gastro-esophageal reflux disease without esophagitis: Secondary | ICD-10-CM

## 2022-06-28 ENCOUNTER — Ambulatory Visit (HOSPITAL_COMMUNITY)
Admission: RE | Admit: 2022-06-28 | Discharge: 2022-06-28 | Disposition: A | Discharge status: Home / Self Care | Payer: PPO | Source: Ambulatory Visit | Attending: Internal Medicine | Admitting: Internal Medicine

## 2022-06-28 DIAGNOSIS — Z87891 Personal history of nicotine dependence: Secondary | ICD-10-CM | POA: Insufficient documentation

## 2022-07-03 ENCOUNTER — Telehealth: Payer: Self-pay | Admitting: Acute Care

## 2022-07-03 DIAGNOSIS — Z87891 Personal history of nicotine dependence: Secondary | ICD-10-CM

## 2022-07-03 DIAGNOSIS — R918 Other nonspecific abnormal finding of lung field: Secondary | ICD-10-CM

## 2022-07-03 NOTE — Telephone Encounter (Signed)
Received call report from [facility] with [facility] on patient's lung cancer screen CT done on 06/28/22. Sarah, please review the result/impression copied below:  IMPRESSION: 1. Multiple pulmonary nodules, including many of which are new, as above, largest of which is technically categorized as Lung-RADS 4AS, suspicious. Follow up low-dose chest CT without contrast in 3 months (please use the following order, "CT CHEST LCS NODULE FOLLOW-UP W/O CM") is recommended. Alternatively, PET may be considered when there is a solid component 20m or larger. 2. The "S" modifier above refers to potentially clinically significant non lung cancer related findings. Specifically, there is aortic atherosclerosis, in addition to left main and 2 vessel coronary artery disease. Please note that although the presence of coronary artery calcium documents the presence of coronary artery disease, the severity of this disease and any potential stenosis cannot be assessed on this non-gated CT examination. Assessment for potential risk factor modification, dietary therapy or pharmacologic therapy may be warranted, if clinically indicated. 3. Mild diffuse bronchial wall thickening with moderate centrilobular and paraseptal emphysema; imaging findings suggestive of underlying COPD. 4. The appearance of the lung bases may suggest developing interstitial lung disease. Outpatient referral to Pulmonology for further clinical evaluation is recommended.  Please advise, thank you.  Also routing to lung nodule pool.

## 2022-07-03 NOTE — Telephone Encounter (Signed)
Lung Cancer Screening CT 06/28/22:   Lungs/Pleura: Multiple pulmonary nodules are again noted throughout the lungs bilaterally, largest of which is a new pulmonary nodule in the periphery of the left lower lobe near the base (axial image 214), with a volume derived mean diameter of 7.7 mm. Many of the new pulmonary nodules are peripheral and in a peribronchovascular distribution, suggestive of potential infectious or inflammatory etiology. No acute consolidative airspace disease. No pleural effusions. Diffuse bronchial wall thickening with moderate centrilobular and paraseptal emphysema. Scattered areas of septal thickening, thickening of the peribronchovascular interstitium and regional areas of architectural distortion are noted, most evident in the mid to lower lungs bilaterally  IMPRESSION: 1. Multiple pulmonary nodules, including many of which are new, as above, largest of which is technically categorized as Lung-RADS 4AS, suspicious. Follow up low-dose chest CT without contrast in 3 months (please use the following order, "CT CHEST LCS NODULE FOLLOW-UP W/O CM") is recommended. Alternatively, PET may be considered when there is a solid component 61m or larger.  Dr WMelvyn Novas SJudson Rochis out of the office this week. Can you review this scan and advise on recommendations?

## 2022-07-05 NOTE — Telephone Encounter (Signed)
Spoke with patient by phone, using two patient identifiers, to discuss results of LDCT.  Emphysema and atherosclerosis, as previously noted.  New pulmonary nodules with recommendation to repeat LDCT in 3 months.  Patient confirms he has been "sick" with chest congestion for several weeks.  Has completed 'two rounds of antibiotics' and is feeling much better.  Patient agrees to repeating CT chest to monitor nodules in 3 months.  Order placed and results/plan faxed to PCP.  Patient had no questions.

## 2022-07-05 NOTE — Telephone Encounter (Signed)
Left voicemail message for pt to call back regarding lung screening CT results.   Per Dr Melvyn Novas and Dr Valeta Harms ok to repeat CT in 3 months.

## 2022-07-09 ENCOUNTER — Other Ambulatory Visit: Payer: Self-pay | Admitting: Internal Medicine

## 2022-07-12 DIAGNOSIS — J9611 Chronic respiratory failure with hypoxia: Secondary | ICD-10-CM | POA: Diagnosis not present

## 2022-07-12 DIAGNOSIS — R7309 Other abnormal glucose: Secondary | ICD-10-CM | POA: Diagnosis not present

## 2022-07-12 DIAGNOSIS — E1140 Type 2 diabetes mellitus with diabetic neuropathy, unspecified: Secondary | ICD-10-CM | POA: Diagnosis not present

## 2022-07-12 DIAGNOSIS — J440 Chronic obstructive pulmonary disease with acute lower respiratory infection: Secondary | ICD-10-CM | POA: Diagnosis not present

## 2022-08-14 ENCOUNTER — Other Ambulatory Visit: Payer: Self-pay | Admitting: Internal Medicine

## 2022-08-28 NOTE — Progress Notes (Unsigned)
Referring Provider: Asencion Noble, MD Primary Care Physician:  Asencion Noble, MD Primary GI Physician: Dr. [name]  Chief Complaint  Patient presents with   Follow-up    No GI issues to discuss.    HPI:   [name] is a 67 y.o. male  with history significant for chronic respiratory failure on supplemental oxygen (3L), type 2 diabetes, HTN, GERD, adenomatous colon polyps, hemorrhoids with intermittent rectal bleeding, biopsy-proven NASH fibrosis, elastography F2/F3, chronic thrombocytopenia following with hematology, splenomegaly, portal gastropathy, currently being followed for routine cirrhosis care, presenting today for follow-up.   Prior EGD June 2022 with normal esophagus s/p dilation, portal hypertensive gastropathy.  Due for repeat in 2024.   Last colonoscopy 08/07/2021: Nonbleeding internal hemorrhoids, three 3-6 mm polyps in the transverse colon resected and retrieved, otherwise normal exam.  Pathology with 1 tubular adenoma, otherwise benign colonic mucosa.  Recommended 5-year repeat.  Last seen via virtual visit 01/26/22. No symptoms of decompensated liver disease. GERD well controlled on Protonix 40 mg daily.  No significant lower GI symptoms. He was due for routine labs and [place] screening.   Labs completed 01/26/2022.  Remarkable for platelets 96 (stable), otherwise no significant abnormalities.  MELD 6.  Abdominal ultrasound 01/26/2022 with fatty liver, splenomegaly, 1.3 left renal cyst, small hyperechoic focus in the midportion of the left kidney may suggest partial volume averaging of renal sinus fat or obstructing calculus.  Today:   NASH:  Due for EGD in June. Due for routine labs and ultrasound at this time.  Denies abdominal distention, lower extremity edema, scleral icterus, jaundice, mental status changes/confusion.  Admits to easy bruising.  No bleeding.  GERD:  Well-controlled on pantoprazole 40 mg daily.  No nausea, vomiting, abdominal pain,  dysphagia.   Bowels are moving well with Benefiber. No brbpr or melena.    Past Medical History:  Diagnosis Date   Anxiety    Arthritis    Asthma    Chest pain    a. 2003 Cath: nl cors.   Concussion    COPD (chronic obstructive pulmonary disease) ([place])    chronically on 3-4L [place]   Depression    Diabetes mellitus    Dyslipidemia    Dysrhythmia    Fracture 06/2014   lower back "L-1"   GERD (gastroesophageal reflux disease)    Hypertension    NASH (nonalcoholic steatohepatitis) 2018   [name], undergoing routine cirrhosis care; [name]   Pneumonia 12/01/2021   PONV (postoperative nausea and vomiting)    Splenomegaly    Thrombocytopenia ([place])     Past Surgical History:  Procedure Laterality Date   BIOPSY  07/26/2016   Procedure: BIOPSY;  Surgeon: [name], MD;  [facility];  Service: Endoscopy;;  gastric   CARDIAC CATHETERIZATION  10/22/2001   normal coronary arteriea (Dr. Domenic Moras)   COLONOSCOPY  08/31/2011   [id number] rectal and colon polyps-treated. Single tubular adenoma. Next TCS 08/2016   COLONOSCOPY N/A 07/26/2016   Dr. [name]: diverticulosis in sigmoid and descending colon. 6 mm benign splenic flexure. surveillance 5 yeras   COLONOSCOPY WITH PROPOFOL N/A 08/07/2021   Surgeon: Hurshel Keys K, DO;  Nonbleeding internal hemorrhoids, three 3-6 mm polyps in the transverse colon resected and retrieved, otherwise normal exam.  Pathology with 1 tubular adenoma, otherwise benign colonic mucosa.  Recommended 5-year repeat.   ESOPHAGOGASTRODUODENOSCOPY N/A 07/26/2016   Dr. [name]: normal esophagus, portal gastropathy, chronic gastritis, normal duodenum, screening  EGD 2 years    ESOPHAGOGASTRODUODENOSCOPY (EGD) WITH ESOPHAGEAL DILATION N/A 05/15/2013   [id number] esophagus-status post Venia Minks dilation/Portal gastropathy. Antral erosions-status post biopsy. extrinsic compression along lesser curvature likely  secondary to splenomegaly. gastric bx benign   ESOPHAGOGASTRODUODENOSCOPY (EGD) WITH PROPOFOL N/A 12/12/2020   Surgeon: [name], MD; ormal esophagus s/p dilation, portal hypertensive gastropathy.  Due for repeat in 2024.   ETHMOIDECTOMY Bilateral 03/02/2022   Procedure: ETHMOIDECTOMY;  Surgeon: Leta Baptist, MD;  Location: [facility];  Service: ENT;  Laterality: Bilateral;   HERNIA REPAIR  03/2009   MALONEY DILATION N/A 12/12/2020   Procedure: Venia Minks DILATION;  Surgeon: [name], MD;  [facility];  Service: Endoscopy;  Laterality: N/A;   MAXILLARY ANTROSTOMY Bilateral 03/02/2022   Procedure: MAXILLARY ANTROSTOMY;  Surgeon: Leta Baptist, MD;  Location: [facility];  Service: ENT;  Laterality: Bilateral;   NASAL SEPTOPLASTY W/ TURBINOPLASTY Bilateral 03/02/2022   Procedure: NASAL SEPTOPLASTY WITH TURBINATE REDUCTION;  Surgeon: Leta Baptist, MD;  Location: [facility];  Service: ENT;  Laterality: Bilateral;   POLYPECTOMY  07/26/2016   Procedure: POLYPECTOMY;  Surgeon: [name], MD;  [facility];  Service: Endoscopy;;  colon   POLYPECTOMY  08/07/2021   Procedure: POLYPECTOMY;  Surgeon: Eloise Harman, DO;  [facility];  Service: Endoscopy;;   SINUS ENDO WITH FUSION N/A 03/02/2022   Procedure: SINUS ENDO WITH FUSION;  Surgeon: Leta Baptist, MD;  Location: [facility];  Service: ENT;  Laterality: N/A;   TRANSTHORACIC ECHOCARDIOGRAM  2011   EF [id number], stage 1 diastolic dysfunction, trace TR & pulm valve regurg    VASECTOMY  1989    Current Outpatient Medications  Medication Sig Dispense Refill   acetaminophen (TYLENOL) 500 MG tablet Take 1,000 mg by mouth every 6 (six) hours as needed for headache or moderate pain.     albuterol (PROVENTIL) (2.5 MG/3ML) 0.083% nebulizer solution Take 3 mLs (2.5 mg total) by nebulization every 2 (two) hours as needed for wheezing or shortness of breath (cough). 75 mL 1   albuterol (VENTOLIN HFA) 108 (90 Base) MCG/ACT inhaler Inhale 2 puffs into  the lungs every 6 (six) hours as needed for shortness of breath.     ALPRAZolam (XANAX) 0.25 MG tablet Take 0.25 mg by mouth 2 (two) times daily as needed for sleep or anxiety.     atorvastatin (LIPITOR) 40 MG tablet Take 40 mg by mouth in the morning.     benzonatate (TESSALON) 200 MG capsule Take 200 mg by mouth 3 (three) times daily as needed for cough.     Budeson-Glycopyrrol-Formoterol (BREZTRI AEROSPHERE) 160-9-4.8 MCG/ACT AERO Inhale 2 puffs into the lungs 2 (two) times daily. 10.7 g 11   Cholecalciferol (VITAMIN D3) 25 MCG (1000 UT) CAPS Take 1,000 Units by mouth in the morning.     diltiazem (CARDIZEM CD) 240 MG 24 hr capsule Take 240 mg by mouth in the morning.     doxycycline (VIBRAMYCIN) 100 MG capsule Take 100 mg by mouth 2 (two) times daily.     ferrous sulfate 325 (65 FE) MG tablet Take 325 mg by mouth in the morning.     fluticasone (FLONASE) 50 MCG/ACT nasal spray Place 1 spray into both nostrils daily as needed for allergies or rhinitis.     gabapentin (NEURONTIN) 300 MG capsule Take 1 capsule (300 mg total) by mouth at bedtime. 30 capsule 5   glimepiride (AMARYL) 1 MG tablet Take 1 mg by mouth daily  with breakfast.     metFORMIN (GLUCOPHAGE) 1000 MG tablet Take 1,000 mg by mouth daily with supper.     Multiple Vitamin (MULTIVITAMIN WITH MINERALS) TABS tablet Take 1 tablet by mouth at bedtime. 30 tablet 2   OXYGEN Inhale 3 L into the lungs continuous.     pantoprazole (PROTONIX) 40 MG tablet Take 1 tablet (40 mg total) by mouth in the morning. 90 tablet 3   pioglitazone (ACTOS) 15 MG tablet Take 15 mg by mouth daily.     sertraline (ZOLOFT) 50 MG tablet Take 1 tablet (50 mg total) by mouth daily. (Patient taking differently: Take 50 mg by mouth in the morning.) 30 tablet 3   tamsulosin (FLOMAX) 0.4 MG CAPS capsule Take 0.4 mg by mouth in the morning.     thiamine 100 MG tablet Take 1 tablet (100 mg total) by mouth daily. (Patient taking differently: Take 100 mg by mouth in the  morning.) 30 tablet 2   vitamin C (ASCORBIC ACID) 250 MG tablet Take 250 mg by mouth in the morning.     No current facility-administered medications for this visit.    Allergies as of 08/30/2022 - Review Complete 08/30/2022  Allergen Reaction Noted   Aspirin Other (See Comments) 03/01/2022   Lioresal [baclofen] Other (See Comments) 07/24/2016   Zocor [simvastatin] Nausea Only 10/05/2013    Family History  Problem Relation Age of Onset   Heart disease Mother    Cancer Mother        lymph nodes   Diabetes Mother    Arthritis Other    Lung disease Other    Asthma Other    Kidney disease Other    Ovarian cancer Sister    Diabetes Brother    Heart disease Brother    Hypertension Brother    Diabetes Sister    Diabetes Sister    Cirrhosis Sister 61       no etoh   Colon cancer Neg Hx     Social History   Socioeconomic History   Marital status: Married    Spouse name: Katharine Look   Number of children: 1   Years of education: Not on file   Highest education level: Not on file  Occupational History   Not on file  Tobacco Use   Smoking status: Former    Packs/day: 1.25    Years: 36.00    Total pack years: 45.00    Types: Cigarettes    Quit date: 04/2011    Years since quitting: 11.4   Smokeless tobacco: Former  Scientific laboratory technician Use: Never used  Substance and Sexual Activity   Alcohol use: No   Drug use: No   Sexual activity: Not Currently  Other Topics Concern   Not on file  Social History Narrative   Not on file   Social Determinants of Health   Financial Resource Strain: Low Risk  (08/16/2020)   Overall Financial Resource Strain (CARDIA)    Difficulty of Paying Living Expenses: Not hard at all  Food Insecurity: No Food Insecurity (08/16/2020)   Hunger Vital Sign    Worried About Running Out of Food in the Last Year: Never true    [place] in the Last Year: Never true  Transportation Needs: No Transportation Needs (08/16/2020)   PRAPARE - Armed forces logistics/support/administrative officer (Medical): No    Lack of Transportation (Non-Medical): No  Physical Activity: Sufficiently Active (08/16/2020)   Exercise Vital Sign  Days of Exercise per Week: 7 days    Minutes of Exercise per Session: 30 min  Stress: No Stress Concern Present (08/16/2020)   [place]    Feeling of Stress : Not at all  Social Connections: Moderately Integrated (08/16/2020)   Social Connection and Isolation Panel [NHANES]    Frequency of Communication with Friends and Family: Three times a week    Frequency of Social Gatherings with Friends and Family: Three times a week    Attends Religious Services: More than 4 times per year    Active Member of Clubs or Organizations: No    Attends Archivist Meetings: Never    Marital Status: Married    Review of Systems: Gen: Denies fever, chills, presyncope, syncope.  Admits to recent URI, improving. CV: Denies chest pain, palpitations. Resp: Denies dyspnea on 3 L O2.  No cough. GI: See HPI Heme: See HPI  Physical Exam: BP 109/67 (BP Location: Right Arm, Patient Position: Sitting, Cuff Size: Normal)   Pulse 98   Temp 97.8 F (36.6 C) (Oral)   Ht 5' 7"$  (1.702 m)   Wt 160 lb 6.4 oz (72.8 kg)   SpO2 96%   BMI 25.12 kg/m  General:   Alert and oriented. No distress noted. Pleasant and cooperative.  Head:  Normocephalic and atraumatic. Eyes:  Conjuctiva clear without scleral icterus. Heart:  S1, S2 present without murmurs appreciated. Lungs:  Clear to auscultation bilaterally. No wheezes, rales, or rhonchi. No distress.  Abdomen:  +BS, soft, non-tender and non-distended. No rebound or guarding. No HSM or masses noted. Msk:  Symmetrical without gross deformities. Normal posture. Extremities:  Without edema. Neurologic:  Alert and  oriented x4 Psych:  Normal mood and affect.    Assessment:  67 y.o. male  with history significant for chronic respiratory  failure on supplemental oxygen (3L), type 2 diabetes, HTN, GERD, adenomatous colon polyps, hemorrhoids with intermittent rectal bleeding, biopsy-proven NASH fibrosis, elastography F2/F3, chronic thrombocytopenia following with hematology, splenomegaly, portal gastropathy, currently being followed for routine cirrhosis care, presenting today for follow-up.   [name] fibrosis/early cirrhosis: Biopsy-proven NASH fibrosis in 2018.  Has remained fairly well compensated to this point.  He does continue to bruise easily in the setting of thrombocytopenia.  No significant bleeding.  Otherwise, no symptoms of decompensated liver disease. MELD Na 6 in July 2023. Due for routine labs and [place] at this time.  He will be due for EGD in June this year.  Last EGD in June 2022 with no esophageal varices but evidence of portal hypertensive gastropathy. He has completed hepatitis A/B vaccine series.   GERD:  Well-controlled on Protonix 40 mg daily. No alarm symptoms.    Plan:  RUQ [place] CBC, HFP, BMP, INR, AFP Continue Protonix 40 mg daily. 2 g sodium diet restriction. Follow-up in June to discuss scheduling EGD.   Aliene Altes, PA-C [facility] 08/30/2022

## 2022-08-30 ENCOUNTER — Encounter: Payer: Self-pay | Admitting: Gastroenterology

## 2022-08-30 ENCOUNTER — Other Ambulatory Visit (HOSPITAL_COMMUNITY)
Admission: RE | Admit: 2022-08-30 | Discharge: 2022-08-30 | Disposition: A | Discharge status: Home / Self Care | Payer: PPO | Source: Ambulatory Visit | Attending: Gastroenterology | Admitting: Gastroenterology

## 2022-08-30 ENCOUNTER — Ambulatory Visit (INDEPENDENT_AMBULATORY_CARE_PROVIDER_SITE_OTHER): Payer: PPO | Admitting: Gastroenterology

## 2022-08-30 VITALS — BP 109/67 | HR 98 | Temp 97.8°F | Ht 67.0 in | Wt 160.4 lb

## 2022-08-30 DIAGNOSIS — K7460 Unspecified cirrhosis of liver: Secondary | ICD-10-CM | POA: Diagnosis not present

## 2022-08-30 DIAGNOSIS — K219 Gastro-esophageal reflux disease without esophagitis: Secondary | ICD-10-CM | POA: Diagnosis not present

## 2022-08-30 DIAGNOSIS — K7581 Nonalcoholic steatohepatitis (NASH): Secondary | ICD-10-CM | POA: Insufficient documentation

## 2022-08-30 DIAGNOSIS — K7400 Hepatic fibrosis, unspecified: Secondary | ICD-10-CM | POA: Insufficient documentation

## 2022-08-30 LAB — BASIC METABOLIC PANEL
Anion gap: 10 (ref 5–15)
BUN: 15 mg/dL (ref 8–23)
CO2: 28 mmol/L (ref 22–32)
Calcium: 9.4 mg/dL (ref 8.9–10.3)
Chloride: 99 mmol/L (ref 98–111)
Creatinine, Ser: 0.73 mg/dL (ref 0.61–1.24)
GFR, Estimated: >60 mL/min (ref >60)
Glucose, Bld: 110 mg/dL — ABNORMAL HIGH (ref 70–99)
Potassium: 3.6 mmol/L (ref 3.5–5.1)
Sodium: 137 mmol/L (ref 135–145)

## 2022-08-30 LAB — CBC
HCT: 40.9 % (ref 39.0–52.0)
Hemoglobin: 13.2 g/dL (ref 13.0–17.0)
MCH: 26.6 pg (ref 26.0–34.0)
MCHC: 32.3 g/dL (ref 30.0–36.0)
MCV: 82.3 fL (ref 80.0–100.0)
Platelets: 128 10*3/uL — ABNORMAL LOW (ref 150–400)
RBC: 4.97 MIL/uL (ref 4.22–5.81)
RDW: 16.7 % — ABNORMAL HIGH (ref 11.5–15.5)
WBC: 5.3 10*3/uL (ref 4.0–10.5)
nRBC: 0 % (ref 0.0–0.2)

## 2022-08-30 LAB — HEPATIC FUNCTION PANEL
ALT: 29 U/L (ref 0–44)
AST: 27 U/L (ref 15–41)
Albumin: 3.6 g/dL (ref 3.5–5.0)
Alkaline Phosphatase: 91 U/L (ref 38–126)
Bilirubin, Direct: 0.1 mg/dL (ref 0.0–0.2)
Indirect Bilirubin: 0.5 mg/dL (ref 0.3–0.9)
Total Bilirubin: 0.6 mg/dL (ref 0.3–1.2)
Total Protein: 7.9 g/dL (ref 6.5–8.1)

## 2022-08-30 LAB — PROTIME-INR
INR: 1 (ref 0.8–1.2)
Prothrombin Time: 13.1 seconds (ref 11.4–15.2)

## 2022-08-30 NOTE — Patient Instructions (Signed)
Please have blood work completed at [facility].  We will arrange for you to have an ultrasound of your liver at [facility] taking pantoprazole 40 mg daily 30 minutes before breakfast.  Monitor your sodium intake.  In general, it is recommended that you do not consume more than 2000 mg of sodium per day due to your history of [name] fibrosis/early cirrhosis.  You are due for an upper endoscopy in June.  Will plan to see back in the office in June to discuss getting this scheduled.  It was great to see you again today!  Aliene Altes, PA-C [facility]

## 2022-09-01 LAB — AFP TUMOR MARKER: AFP, Serum, Tumor Marker: <1.8 ng/mL (ref 0.0–8.4)

## 2022-09-05 NOTE — Progress Notes (Unsigned)
[name], male    DOB: 1955-07-17     MRN: [id number]   Brief patient profile:  89 yowm MM/quit smoking 2012 with COPD GOLD III criteria 06/17/20 previously followed by Dr [name] referred back to pulmonary clinic 12/09/2019 in [place].   History of Present Illness  12/09/2019  Pulmonary/ 1st office eval/Kynan Peasley  symbicort 160/spiriva  Chief Complaint  Patient presents with   Pulmonary Consult    Former patient of Dr [name].  Breathing has been worse over the past few wks. Recently treated with Doxy per PCP for COPD exacerbation. He is occ coughing up some green sputum.   flare 11/23/19  rx doxy 5/19 x 7 days  Dyspnea:  Baseline uses HC parking / MMRC3 = can't walk 100 yards even at a slow pace at a flat grade s stopping due to sob  On 2lpm  Cough: improved less purulent purulent  Sleep: wakes up coughing since onset /sometimes uses saba proair  SABA use: rarely daytime rec Continue protonix 40 mg Take 30-60 min before first meal of the day  GERD diet  Plan A = Automatic = Always=    Budesonide/formoterol (symbicort ) 2 puffs first thing in am and 12 hours later and use spiriva x 2 pffs after the morning symbicort (ok to change back to the powder after 2 weeks but will need to stop it if you start coughing  Work on inhaler technique  Plan B = Backup (to supplement plan A, not to replace it) Only use your albuterol inhaler as a rescue medication    Plan C = Crisis (instead of Plan B   Make sure you check your oxygen saturations at highest level of activity to be sure it stays over 90%   Prednisone 10 mg take  4 each am x 2 days,   2 each am x 2 days,  1 each am x 2 days and stop  For cough > mucinex dm 1200 mg every 12 hours as needed  Would wait another 2 weeks before you get your first shot for covid 19    02/03/2020  f/u ov/Shauntell Iglesia re: GOLD III/ 02 dep/ rx symb /spriva powder  Chief Complaint  Patient presents with   Acute Visit    shortness of breath with exertion, productive  cough with green phlegm  Dyspnea: abruptly worse x 48 h = MMRC3 = can't walk 100 yards even at a slow pace at a flat grade s stopping due to sob   Cough: worse with green mucus/ worse since changed back to dpi spiriva Sleeping: 2 am tends to cramp SABA use: none on day of ov  02: 2lpm  Up to 2.5 lpm  rec zpak Prednisone 10 mg take  4 each am x 2 days,   2 each am x 2 days,  1 each am x 2 days and stop  Change spiriva to respimat 2 puff each am  For cough mucinex dm up to 1200 mg every 12 hours and supplement hydocodone every 4 hours as needed  Get your covid vaccination asap We will call [employer] for a best fit analysis for portable 0xygen  In meantime, Make sure you check your oxygen saturations at highest level of activity to be sure it stays over 90% and adjust upward to maintain this level if needed but remember to turn it back to previous settings when you stop (to conserve your supply).    [name] eval 08/01/20 rec ST eval done 08/05/20: low risk  asp SLP Diet Recommendations Regular solids;Thin liquid  Liquid Administration via Straw;Cup  Medication Administration Whole meds with liquid  Compensations Slow rate;Small sips/bites  Postural Changes Seated upright at 90 degrees;Remain semi-upright after after feeds/meals (Comment)    06/06/2021  f/u ov/[place] office/Algernon Mundie re: GOLD 3/ 02 dep / maint on spiriva/ symbicort 160   Chief Complaint  Patient presents with   Follow-up    Feels breathing has worsened since last OV.  3lpm cont. At home 2.5 lpm cont. When out of house to conserve O2  Dyspnea:  sob at rest on 3lpm 24/7  Cough: slt green then clear  Sleeping: bed blocks / 3 pillows new  SABA use: 2-3 x per weeks/ no neb  02: 3lpm 24/7  Covid status: vax x 3  Lung cancer screening: scheduled for 06/28/21  Chills s sweats x 24 h Recurrent swelling R > L  Fleeting L cp cleared up p levoquin Rec Plan A = Automatic = Always=    Symbicort Take 2 puffs first thing in am and then  another 2 puffs about 12 hours later and spiriva respimat 2 puffs after am symbicort  Plan B = Backup (to supplement plan A, not to replace it) Only use your albuterol inhaler as a rescue medication  Plan C = Crisis (instead of Plan B but only if Plan B stops working) - only use your albuterol nebulizer if you first try Plan B and it fails to help Make sure you check your oxygen saturation  at your highest level of activity  to be sure it stays over 90%  Zpak  Prednisone 10 mg take  4 each am x 2 days,   2 each am x 2 days,  1 each am x 2 days and stop Please schedule a follow up visit in 3 months but call sooner if needed    11/29/21 vertigo vomiting >  ER 5/24 rx zpak and missed surgery on 12/02/22 rescheduled for Aug 2023 for chronic severe L nasal congestion with very small osteo     [name] 03/02/22 FESS   04/05/2022  f/u ov/[place] office/Harbor Vanover re: GOLD 3 copd maint on symb/spiriva = 240 dollars Chief Complaint  Patient presents with   Follow-up    Saw Dr. Benjamine Mola and had surgery and is doing much better with his breathing    Dyspnea:  mailbox and back using 02 sometimes/  Cough: overall better since sinus surgery  Sleeping: noct better on bed blocks  SABA use: much less hfa/ never neb  02: 3lpm hs / sitting  and walks off it   Lung cancer screening: 06/28/22 Multiple pulmonary nodules, including many of which are new, as above, largest of which is technically categorized as Lung-RADS 4AS, suspicious. Follow up low-dose chest CT without contrast in 3 months> scheduled for 09/27/22  rec Plan A = Automatic = Always=    Breztri Take 2 puffs first thing in am and then another 2 puffs about 12 hours later.   Plan B = Backup (to supplement plan A, not to replace it) Only use your albuterol inhaler as a rescue medication  Plan C = Crisis (instead of Plan B but only if Plan B stops working) - only use your albuterol nebulizer if you first try Plan B and it fails to help Prednisone 10 mg take   4 each am x 2 days,   2 each am x 2 days,  1 each am x 2 days and stop  For cough  mucinex dm 1200 mg every 12 hours as needed  Pantoprazole (protonix) 40 mg   Take  30-60 min before first meal of the day and Pepcid (famotidine)  20 mg (OTC) after supper until return to office GERD diet reviewed, bed blocks rec  If cough not better add another gabapentin 300 mg in am   Make sure you check your oxygen saturation  AT  your highest level of activity (not after you stop)   to be sure it stays over 90% a     09/06/2022  f/u ov/[place] office/Janelly Switalski re: *** maint on ***  No chief complaint on file.   Dyspnea:  *** Cough: *** Sleeping: *** SABA use: *** 02: *** Covid status: *** Lung cancer screening: ***   No obvious day to day or daytime variability or assoc excess/ purulent sputum or mucus plugs or hemoptysis or cp or chest tightness, subjective wheeze or overt sinus or hb symptoms.   *** without nocturnal  or early am exacerbation  of respiratory  c/o's or need for noct saba. Also denies any obvious fluctuation of symptoms with weather or environmental changes or other aggravating or alleviating factors except as outlined above   No unusual exposure hx or h/o childhood pna/ asthma or knowledge of premature birth.  Current Allergies, Complete Past Medical History, Past Surgical History, Family History, and Social History were reviewed in [employer] record.  ROS  The following are not active complaints unless bolded Hoarseness, sore throat, dysphagia, dental problems, itching, sneezing,  nasal congestion or discharge of excess mucus or purulent secretions, ear ache,   fever, chills, sweats, unintended wt loss or wt gain, classically pleuritic or exertional cp,  orthopnea pnd or arm/hand swelling  or leg swelling, presyncope, palpitations, abdominal pain, anorexia, nausea, vomiting, diarrhea  or change in bowel habits or change in bladder habits, change in stools or  change in urine, dysuria, hematuria,  rash, arthralgias, visual complaints, headache, numbness, weakness or ataxia or problems with walking or coordination,  change in mood or  memory.        No outpatient medications have been marked as taking for the 09/06/22 encounter (Appointment) with Tanda Rockers, MD.                 Past Medical History:  Diagnosis Date   Anxiety    Asthma    Chest pain    a. 2003 Cath: nl cors.   COPD (chronic obstructive pulmonary disease) ([name])    Depression    Diabetes mellitus    Dyslipidemia    Dysrhythmia    Fracture 12/15   lower back "L-1"   GERD (gastroesophageal reflux disease)    Hypertension    PONV (postoperative nausea and vomiting)    Splenomegaly        Objective:    Wt   09/06/2022         ***  04/05/2022        156 12/08/2021          164 09/01/2021        167  06/06/2021      162 12/08/2020          163 09/15/2020        154  05/23/2020      166 04/28/2020      166   12/30/19 162 lb (73.5 kg)  12/21/19 162 lb (73.5 kg)  12/09/19 162 lb (73.5 kg)    Vital signs reviewed  09/06/2022  - Note at rest 02 sats  ***% on ***   General appearance:    ***      Mod barr***              Assessment

## 2022-09-06 ENCOUNTER — Ambulatory Visit (INDEPENDENT_AMBULATORY_CARE_PROVIDER_SITE_OTHER): Payer: PPO | Admitting: Internal Medicine

## 2022-09-06 ENCOUNTER — Encounter: Payer: Self-pay | Admitting: Radiology

## 2022-09-06 ENCOUNTER — Encounter: Payer: Self-pay | Admitting: Internal Medicine

## 2022-09-06 VITALS — BP 128/74 | HR 78 | Ht 67.0 in | Wt 163.4 lb

## 2022-09-06 DIAGNOSIS — J9611 Chronic respiratory failure with hypoxia: Secondary | ICD-10-CM | POA: Diagnosis not present

## 2022-09-06 DIAGNOSIS — Z87891 Personal history of nicotine dependence: Secondary | ICD-10-CM

## 2022-09-06 DIAGNOSIS — J449 Chronic obstructive pulmonary disease, unspecified: Secondary | ICD-10-CM

## 2022-09-06 MED ORDER — FAMOTIDINE 20 MG PO TABS: ORAL_TABLET | ORAL | 11 refills | Status: DC

## 2022-09-06 NOTE — Assessment & Plan Note (Signed)
Quit 2012 so eligible for LDSCT until 2027   - LDSCT reviewed from 06/28/22 > for repeat 09/27/22   He has MPN's likely many inflammatory therefore serial CT better than PET in this setting/ advised

## 2022-09-06 NOTE — Assessment & Plan Note (Signed)
Quit smoking 2012 - alpha one AT screen  01/31/17  MM  Level 169 - PFT's  06/18/2019  FEV1 1.12 (35 % ) ratio 0.46  p 0 % improvement from saba p ? prior to study with DLCO  11.91 (48%) corrects to 3.18 (754%)  for alv volume and FV curve classic concavity   - 12/09/2019  After extensive coaching inhaler device,  effectiveness =    90% with smi so try spiriva respimat 2 bid instead of DPI  - 12/08/2020  After extensive coaching inhaler device,  effectiveness = 80%      - [name] 03/02/22 FESS  - 04/05/2022  After extensive coaching inhaler device,  effectiveness =    75% try change back to [facility] to see if effective and cheaper than symbiocort/spiriva rx  - 09/06/2022  After extensive coaching inhaler device,  effectiveness =    80% (short ti) > continue breztri and approp saba    Group D (now reclassified as E) in terms of symptom/risk and laba/lama/ICS  therefore appropriate rx at this point >>>  breztri plus prn saba hfa/neb=  ABC plan   Improving p recent flare precipitated by apparent rhinitis/ sinusitis flare and unable to use nettipot but has f/u with [name] already arranged   For cough added mucinex dm 1200 mg bid and pepcid 20 mg after supper to prevent UACS/ cyclcal coughing

## 2022-09-06 NOTE — Assessment & Plan Note (Addendum)
Started on 02 around 2019 As of 02/03/2020  2lpm 24/7  -  02/03/2020 referred to [employer] for Best fit for amb 02 > 3lpm pulsed tank -  04/28/2020   Walked 3lpm pulsed tank  approx   400 ft  @ moderate pace  stopped due to  Sob/tired  with sats still 95%    -  09/15/2020   Walked 3lpm cont approx   200 ft  @ slow/slt undsteady  pace  stopped due to  Sob/fatigue   With sats 91% at rest sats were 86% RA   12/08/2020   Walked RA  approx   500 ft  @ moderate pace  stopped due to  End of study, a little sob, sats still 90%  - 04/05/2022 patient walked at a moderate pace on room air all three laps @ 150 ft each. Desat to 87% at end of third lap. Patient placed back on 3LO2 cont at end of walk  - 09/06/2022   Walked on 2lpm cont   x  3  lap(s) =  approx 450  ft  @ moderate pace, stopped due to end of study  with lowest 02 sats 93%    Reviewed: Make sure you check your oxygen saturation  AT  your highest level of activity (not after you stop)   to be sure it stays over 90% and adjust  02 flow upward to maintain this level if needed but remember to turn it back to previous settings when you stop (to conserve your supply).   F/u  q 71m sooner prn   Each maintenance medication was reviewed in detail including emphasizing most importantly the difference between maintenance and prns and under what circumstances the prns are to be triggered using an action plan format where appropriate.  Total time for H and P, chart review, counseling, reviewing hfa/neb/02 device(s) , directly observing portions of ambulatory 02 saturation study/ and generating customized AVS unique to this office visit / same day charting > 30 min semi annual eval / rx review

## 2022-09-06 NOTE — Patient Instructions (Addendum)
Work on inhaler technique:  relax and gently blow all the way out then take a nice smooth full deep breath back in, triggering the inhaler at same time you start breathing in.  Hold breath in for at least  5 seconds if you can. Blow out breztri thru nose. Rinse and gargle with water when done.  If mouth or throat bother you at all,  try brushing teeth/gums/tongue with arm and hammer toothpaste/ make a slurry and gargle and spit out.     For cough > mucinex dm 1200  mg every 12 hours as needed   When cough flares for any reason > add pepcid 20 mg after supper   Make sure you check your oxygen saturation  AT  your highest level of activity (not after you stop)   to be sure it stays over 90% and adjust  02 flow upward to maintain this level if needed but remember to turn it back to previous settings when you stop (to conserve your supply).    Please schedule a follow up visit in 6  months but call sooner if needed

## 2022-09-10 ENCOUNTER — Ambulatory Visit (HOSPITAL_COMMUNITY)
Admission: RE | Admit: 2022-09-10 | Discharge: 2022-09-10 | Disposition: A | Discharge status: Home / Self Care | Payer: PPO | Source: Ambulatory Visit | Attending: Gastroenterology | Admitting: Gastroenterology

## 2022-09-10 DIAGNOSIS — K7581 Nonalcoholic steatohepatitis (NASH): Secondary | ICD-10-CM | POA: Insufficient documentation

## 2022-09-10 DIAGNOSIS — K7460 Unspecified cirrhosis of liver: Secondary | ICD-10-CM | POA: Diagnosis not present

## 2022-09-12 DIAGNOSIS — E1140 Type 2 diabetes mellitus with diabetic neuropathy, unspecified: Secondary | ICD-10-CM | POA: Diagnosis not present

## 2022-09-27 ENCOUNTER — Ambulatory Visit (HOSPITAL_COMMUNITY)
Admission: RE | Admit: 2022-09-27 | Discharge: 2022-09-27 | Disposition: A | Discharge status: Home / Self Care | Payer: PPO | Source: Ambulatory Visit | Attending: Acute Care | Admitting: Acute Care

## 2022-09-27 DIAGNOSIS — R918 Other nonspecific abnormal finding of lung field: Secondary | ICD-10-CM

## 2022-09-27 DIAGNOSIS — J439 Emphysema, unspecified: Secondary | ICD-10-CM | POA: Diagnosis not present

## 2022-09-27 DIAGNOSIS — Z87891 Personal history of nicotine dependence: Secondary | ICD-10-CM | POA: Diagnosis not present

## 2022-09-27 DIAGNOSIS — R911 Solitary pulmonary nodule: Secondary | ICD-10-CM | POA: Diagnosis not present

## 2022-10-03 ENCOUNTER — Telehealth: Payer: Self-pay | Admitting: Acute Care

## 2022-10-03 DIAGNOSIS — Z87891 Personal history of nicotine dependence: Secondary | ICD-10-CM

## 2022-10-03 DIAGNOSIS — R918 Other nonspecific abnormal finding of lung field: Secondary | ICD-10-CM

## 2022-10-03 NOTE — Addendum Note (Signed)
Addended by: Doroteo Glassman D on: 10/03/2022 02:39 PM   Modules accepted: Orders

## 2022-10-03 NOTE — Telephone Encounter (Signed)
Left VM and call back number to call for review of LDCT results

## 2022-10-03 NOTE — Telephone Encounter (Signed)
Spoke with pt and reviewed CT results. Nodule seen that we would like to look at again in 6 months with a repeat Chest CT.  Emphysema/ coronary calcifications seen. Pt is on statin drug.  Pt verbalized understanding. Results/ plans faxed to PCP. Order placed for 6 mth nodule f/u CT.

## 2022-10-15 DIAGNOSIS — J322 Chronic ethmoidal sinusitis: Secondary | ICD-10-CM | POA: Diagnosis not present

## 2022-10-15 DIAGNOSIS — J320 Chronic maxillary sinusitis: Secondary | ICD-10-CM | POA: Diagnosis not present

## 2022-10-15 DIAGNOSIS — J338 Other polyp of sinus: Secondary | ICD-10-CM | POA: Diagnosis not present

## 2022-11-09 DIAGNOSIS — D696 Thrombocytopenia, unspecified: Secondary | ICD-10-CM | POA: Diagnosis not present

## 2022-11-09 DIAGNOSIS — Z515 Encounter for palliative care: Secondary | ICD-10-CM | POA: Diagnosis not present

## 2022-11-09 DIAGNOSIS — K7400 Hepatic fibrosis, unspecified: Secondary | ICD-10-CM | POA: Diagnosis not present

## 2022-11-09 DIAGNOSIS — K7581 Nonalcoholic steatohepatitis (NASH): Secondary | ICD-10-CM | POA: Diagnosis not present

## 2022-11-09 DIAGNOSIS — K766 Portal hypertension: Secondary | ICD-10-CM | POA: Diagnosis not present

## 2022-11-18 ENCOUNTER — Other Ambulatory Visit: Payer: Self-pay | Admitting: Internal Medicine

## 2022-12-27 ENCOUNTER — Other Ambulatory Visit (HOSPITAL_COMMUNITY): Payer: Self-pay | Admitting: Internal Medicine

## 2022-12-27 ENCOUNTER — Ambulatory Visit (HOSPITAL_COMMUNITY)
Admission: RE | Admit: 2022-12-27 | Discharge: 2022-12-27 | Disposition: A | Discharge status: Home / Self Care | Payer: PPO | Source: Ambulatory Visit | Attending: Internal Medicine | Admitting: Internal Medicine

## 2022-12-27 DIAGNOSIS — R110 Nausea: Secondary | ICD-10-CM | POA: Diagnosis not present

## 2022-12-27 DIAGNOSIS — R059 Cough, unspecified: Secondary | ICD-10-CM | POA: Diagnosis not present

## 2022-12-27 DIAGNOSIS — J441 Chronic obstructive pulmonary disease with (acute) exacerbation: Secondary | ICD-10-CM | POA: Diagnosis not present

## 2023-01-11 DIAGNOSIS — J449 Chronic obstructive pulmonary disease, unspecified: Secondary | ICD-10-CM | POA: Diagnosis not present

## 2023-01-11 DIAGNOSIS — J9611 Chronic respiratory failure with hypoxia: Secondary | ICD-10-CM | POA: Diagnosis not present

## 2023-01-11 DIAGNOSIS — Z79899 Other long term (current) drug therapy: Secondary | ICD-10-CM | POA: Diagnosis not present

## 2023-01-11 DIAGNOSIS — E1140 Type 2 diabetes mellitus with diabetic neuropathy, unspecified: Secondary | ICD-10-CM | POA: Diagnosis not present

## 2023-01-18 DIAGNOSIS — R7309 Other abnormal glucose: Secondary | ICD-10-CM | POA: Diagnosis not present

## 2023-01-18 DIAGNOSIS — D696 Thrombocytopenia, unspecified: Secondary | ICD-10-CM | POA: Diagnosis not present

## 2023-01-18 DIAGNOSIS — E785 Hyperlipidemia, unspecified: Secondary | ICD-10-CM | POA: Diagnosis not present

## 2023-01-18 DIAGNOSIS — K7581 Nonalcoholic steatohepatitis (NASH): Secondary | ICD-10-CM | POA: Diagnosis not present

## 2023-01-18 DIAGNOSIS — R809 Proteinuria, unspecified: Secondary | ICD-10-CM | POA: Diagnosis not present

## 2023-01-18 DIAGNOSIS — E1122 Type 2 diabetes mellitus with diabetic chronic kidney disease: Secondary | ICD-10-CM | POA: Diagnosis not present

## 2023-01-23 ENCOUNTER — Other Ambulatory Visit: Payer: Self-pay | Admitting: *Deleted

## 2023-01-23 ENCOUNTER — Telehealth: Payer: Self-pay | Admitting: *Deleted

## 2023-01-23 DIAGNOSIS — K7581 Nonalcoholic steatohepatitis (NASH): Secondary | ICD-10-CM

## 2023-01-23 NOTE — Telephone Encounter (Signed)
Reviewed.  Labs look good overall.  Platelets remain low, but stable at 105,000.  LFTs within normal limits.  Kidney function and electrolytes within normal limits.  The only thing that we are missing for routine blood work regarding his liver disease would be an INR.  He can go ahead and have this completed at [facility].  Please arrange.  Diagnosis is NASH.

## 2023-01-23 NOTE — Telephone Encounter (Signed)
Received labs from Dr. Alonza Smoker office. Placed in your box.

## 2023-01-23 NOTE — Telephone Encounter (Signed)
Spoke to pt informed him of results and recommendations. Pt voiced understanding. Labs entered into Epic.

## 2023-01-28 DIAGNOSIS — K7581 Nonalcoholic steatohepatitis (NASH): Secondary | ICD-10-CM | POA: Diagnosis not present

## 2023-01-29 LAB — PROTIME-INR
INR: 1.1 (ref 0.9–1.2)
Prothrombin Time: 11.5 s (ref 9.1–12.0)

## 2023-03-06 NOTE — Progress Notes (Unsigned)
Marcelene Butte, male    DOB: 09-Feb-1956     MRN: [id number]   Brief patient profile:  86 yowm MM/quit smoking 2012 with COPD GOLD III criteria 06/17/20 previously followed by Dr Juanetta Gosling referred back to pulmonary clinic 12/09/2019 in [place].   History of Present Illness  12/09/2019  Pulmonary/ 1st office eval/Dustyn Armbrister  symbicort 160/spiriva  Chief Complaint  Patient presents with   Pulmonary Consult    Former patient of Dr Juanetta Gosling.  Breathing has been worse over the past few wks. Recently treated with Doxy per PCP for COPD exacerbation. He is occ coughing up some green sputum.   flare 11/23/19  rx doxy 5/19 x 7 days  Dyspnea:  Baseline uses [employer] parking / M[employer] = can't walk 100 yards even at a slow pace at a flat grade s stopping due to sob  On 2lpm  Cough: improved less purulent purulent  Sleep: wakes up coughing since onset /sometimes uses saba proair  SABA use: rarely daytime rec Continue protonix 40 mg Take 30-60 min before first meal of the day  GERD diet  Plan A = Automatic = Always=    Budesonide/formoterol (symbicort ) 2 puffs first thing in am and 12 hours later and use spiriva x 2 pffs after the morning symbicort (ok to change back to the powder after 2 weeks but will need to stop it if you start coughing  Work on inhaler technique  Plan B = Backup (to supplement plan A, not to replace it) Only use your albuterol inhaler as a rescue medication    Plan C = Crisis (instead of Plan B   Make sure you check your oxygen saturations at highest level of activity to be sure it stays over 90%   Prednisone 10 mg take  4 each am x 2 days,   2 each am x 2 days,  1 each am x 2 days and stop  For cough > mucinex dm 1200 mg every 12 hours as needed  Would wait another 2 weeks before you get your first shot for covid 19    [name] eval 08/01/20 rec ST eval done 08/05/20: low risk asp SLP Diet Recommendations Regular solids;Thin liquid  Liquid Administration via Straw;Cup  Medication Administration  Whole meds with liquid  Compensations Slow rate;Small sips/bites  Postural Changes Seated upright at 90 degrees;Remain semi-upright after after feeds/meals (Comment)     [name] 03/02/22 FESS   04/05/2022  f/u ov/[place] office/Cleotis Sparr re: GOLD 3 copd maint on symb/spiriva = 240 dollars Chief Complaint  Patient presents with   Follow-up    Saw Dr. Suszanne Conners and had surgery and is doing much better with his breathing   Dyspnea:  mailbox and back using 02 sometimes/  Cough: overall better since sinus surgery  Sleeping: noct better on bed blocks  SABA use: much less hfa/ never neb  02: 3lpm hs / sitting  and walks off it rec Plan A = Automatic = Always=    Breztri Take 2 puffs first thing in am and then another 2 puffs about 12 hours later.   Plan B = Backup (to supplement plan A, not to replace it) Only use your albuterol inhaler as a rescue medication  Plan C = Crisis (instead of Plan B but only if Plan B stops working) - only use your albuterol nebulizer if you first try Plan B and it fails to help Prednisone 10 mg take  4 each am x 2 days,   2  each am x 2 days,  1 each am x 2 days and stop  For cough mucinex dm 1200 mg every 12 hours as needed  Pantoprazole (protonix) 40 mg   Take  30-60 min before first meal of the day and Pepcid (famotidine)  20 mg (OTC) after supper until return to office GERD diet reviewed, bed blocks rec  If cough not better add another gabapentin 300 mg in am   Make sure you check your oxygen saturation  AT  your highest level of activity (not after you stop)   to be sure it stays over 90%       09/06/2022  f/u ov/[place] office/Disa Riedlinger re: GOLD 3 copd /02 hs and prn  maint on breztri / humidified 02/ recurrent sinus infections not able to use [employer] / worse since 3 weeks prior to OV  rx by Emelda Fear with cloudy and tessalon  Chief Complaint  Patient presents with   Follow-up    Breathing was doing okay but then patient got sick and he is still trying to recover his  breathing   Dyspnea:  mb and back a football field flat / on 2lpm  Cough: was coughing just at hs/ then 3 weeks worse > mucus clear  Sleeping: bed blocks and pillows "almost upright"= 60 degrees  x years  SABA use: just once hfa / once neb  02: 3lpm / 3lpm cont walking  Covid status: vax all but last one  Lung cancer screening: in program  Rec Work on inhaler technique:   For cough > mucinex dm 1200  mg every 12 hours as needed When cough flares for any reason > add pepcid 20 mg after supper  Make sure you check your oxygen saturation  AT  your highest level of activity (not after you stop)   to be sure it stays over 90%       03/07/2023  f/u ov/[place] office/Pearse Shiffler re: GOLD 3 / 02 hs and prn  MPNS  maint on breztri  / not taking pepcid any more Chief Complaint  Patient presents with   COPD    GOLD III  Dyspnea:  mb x 300 ft flat can do s stopping  Cough: worse at hs and in am p stirring but no excess / purulent sputum Sleeping: bed blocks and pillows s   resp cc  SABA use: hfa x once a week/ neb hardly any  02: 3lpm at hs , prn daytime   Lung cancer screening: due 03/2023    No obvious day to day or daytime variability or assoc   mucus plugs or hemoptysis or cp or chest tightness, subjective wheeze or overt sinus or hb symptoms.    Also denies any obvious fluctuation of symptoms with weather or environmental changes or other aggravating or alleviating factors except as outlined above   No unusual exposure hx or h/o childhood pna/ asthma or knowledge of premature birth.  Current Allergies, Complete Past Medical History, Past Surgical History, Family History, and Social History were reviewed in [employer] record.  ROS  The following are not active complaints unless bolded Hoarseness, sore throat, dysphagia, dental problems, itching, sneezing,  nasal congestion or discharge of excess mucus or purulent secretions, ear ache,   fever, chills, sweats,  unintended wt loss or wt gain, classically pleuritic or exertional cp,  orthopnea pnd or arm/hand swelling  or leg swelling, presyncope, palpitations, abdominal pain, anorexia, nausea, vomiting, diarrhea  or change in bowel habits or  change in bladder habits, change in stools or change in urine, dysuria, hematuria,  rash, arthralgias, visual complaints, headache, numbness, weakness or ataxia or problems with walking or coordination,  change in mood or  memory.        Current Meds  Medication Sig   acetaminophen (TYLENOL) 500 MG tablet Take 1,000 mg by mouth every 6 (six) hours as needed for headache or moderate pain.   albuterol (PROVENTIL) (2.5 MG/3ML) 0.083% nebulizer solution Take 3 mLs (2.5 mg total) by nebulization every 2 (two) hours as needed for wheezing or shortness of breath (cough).   albuterol (VENTOLIN HFA) 108 (90 Base) MCG/ACT inhaler INHALE 2 PUFFS INTO THE LUNGS EVERY 4 HOURS AS NEEDED FOR WHEEZING OR SHORTNESS OF BREATH.   ALPRAZolam (XANAX) 0.25 MG tablet Take 0.25 mg by mouth 2 (two) times daily as needed for sleep or anxiety.   atorvastatin (LIPITOR) 40 MG tablet Take 40 mg by mouth in the morning.   Budeson-Glycopyrrol-Formoterol (BREZTRI AEROSPHERE) 160-9-4.8 MCG/ACT AERO Inhale 2 puffs into the lungs 2 (two) times daily.   Cholecalciferol (VITAMIN D3) 25 MCG (1000 UT) CAPS Take 1,000 Units by mouth in the morning.   diltiazem (CARDIZEM CD) 240 MG 24 hr capsule Take 240 mg by mouth in the morning.   famotidine (PEPCID) 20 MG tablet One after supper   ferrous sulfate 325 (65 FE) MG tablet Take 325 mg by mouth in the morning.   fluticasone (FLONASE) 50 MCG/ACT nasal spray Place 1 spray into both nostrils daily as needed for allergies or rhinitis.   gabapentin (NEURONTIN) 300 MG capsule Take 1 capsule (300 mg total) by mouth at bedtime.   glimepiride (AMARYL) 1 MG tablet Take 1 mg by mouth daily with breakfast.   losartan (COZAAR) 25 MG tablet Take 25 mg by mouth daily.    metFORMIN (GLUCOPHAGE) 1000 MG tablet Take 1,000 mg by mouth daily with supper.   Multiple Vitamin (MULTIVITAMIN WITH MINERALS) TABS tablet Take 1 tablet by mouth at bedtime.   ondansetron (ZOFRAN-ODT) 4 MG disintegrating tablet Take 4 mg by mouth every 6 (six) hours as needed.   OXYGEN Inhale 3 L into the lungs continuous.   pantoprazole (PROTONIX) 40 MG tablet Take 1 tablet (40 mg total) by mouth in the morning.   pioglitazone (ACTOS) 15 MG tablet Take 15 mg by mouth daily.   sertraline (ZOLOFT) 50 MG tablet Take 1 tablet (50 mg total) by mouth daily. (Patient taking differently: Take 50 mg by mouth in the morning.)   tamsulosin (FLOMAX) 0.4 MG CAPS capsule Take 0.4 mg by mouth in the morning.   thiamine 100 MG tablet Take 1 tablet (100 mg total) by mouth daily. (Patient taking differently: Take 100 mg by mouth in the morning.)   vitamin C (ASCORBIC ACID) 250 MG tablet Take 250 mg by mouth in the morning.            Past Medical History:  Diagnosis Date   Anxiety    Asthma    Chest pain    a. 2003 Cath: nl cors.   COPD (chronic obstructive pulmonary disease) ([name])    Depression    Diabetes mellitus    Dyslipidemia    Dysrhythmia    Fracture 12/15   lower back "L-1"   GERD (gastroesophageal reflux disease)    Hypertension    PONV (postoperative nausea and vomiting)    Splenomegaly        Objective:    Wt   03/07/2023  167   09/06/2022        163   04/05/2022        156 12/08/2021          164 09/01/2021        167  06/06/2021      162 12/08/2020          163 09/15/2020        154  05/23/2020      166 04/28/2020      166   12/30/19 162 lb (73.5 kg)  12/21/19 162 lb (73.5 kg)  12/09/19 162 lb (73.5 kg)    Vital signs reviewed  03/07/2023  - Note at rest 02 sats  93% on 3lpm cont   General appearance:    amb slt hoarse wm nad    HEENT :  Oropharynx  wearing mask       NECK :  without JVD/Nodes/TM/ nl carotid upstrokes bilaterally   LUNGS: no acc muscle use,   Mod barrel  contour chest wall with bilateral  Distantlater exp  wheeze and  without cough on insp or exp maneuvers and mod  Hyperresonant  to  percussion bilaterally     CV:  RRR  no s3 or murmur or increase in P2, and no edema   ABD:  obese soft and nontender    MS:   Ext warm without deformities or   obvious joint restrictions , calf tenderness, cyanosis or clubbing  SKIN: warm and dry without lesions    NEURO:  alert, approp, nl sensorium with  no motor or cerebellar deficits apparent.                  Assessment

## 2023-03-07 ENCOUNTER — Encounter: Payer: Self-pay | Admitting: Internal Medicine

## 2023-03-07 ENCOUNTER — Other Ambulatory Visit: Payer: Self-pay | Admitting: Internal Medicine

## 2023-03-07 ENCOUNTER — Ambulatory Visit: Payer: PPO | Admitting: Internal Medicine

## 2023-03-07 VITALS — BP 116/66 | HR 103 | Ht 67.0 in | Wt 167.0 lb

## 2023-03-07 DIAGNOSIS — J449 Chronic obstructive pulmonary disease, unspecified: Secondary | ICD-10-CM | POA: Diagnosis not present

## 2023-03-07 DIAGNOSIS — J9611 Chronic respiratory failure with hypoxia: Secondary | ICD-10-CM | POA: Diagnosis not present

## 2023-03-07 NOTE — Patient Instructions (Addendum)
Add pepcid 20 mg an hour before bedtime (automatically)  Make sure you check your oxygen saturation  AT  your highest level of activity (not after you stop)   to be sure it stays over 90% and adjust  02 flow upward to maintain this level if needed but remember to turn it back to previous settings when you stop (to conserve your supply).   Also  Ok to try albuterol 15 min before an activity (on alternating days)  that you know would usually make you short of breath and see if it makes any difference and if makes none then don't take albuterol after activity unless you can't catch your breath as this means it's the resting that helps, not the albuterol.      Please schedule a follow up visit in 6  months but call sooner if needed

## 2023-03-07 NOTE — Assessment & Plan Note (Addendum)
Quit smoking 2012 - alpha one AT screen  01/31/17  MM  Level 169 - PFT's  06/18/2019  FEV1 1.12 (35 % ) ratio 0.46  p 0 % improvement from saba p ? prior to study with DLCO  11.91 (48%) corrects to 3.18 (754%)  for alv volume and FV curve classic concavity   - [name] 03/02/22 FESS  - 03/07/2023  After extensive coaching inhaler device,  effectiveness =  80% (short ti) > continue breztri   Group D (now reclassified as E) in terms of symptom/risk and laba/lama/ICS  therefore appropriate rx at this point >>>  breztri plus approp saba and add pepcid 20 mg qs for noct/am coughing  Re SABA :  I spent extra time with pt today reviewing appropriate use of albuterol for prn use on exertion with the following points: 1) saba is for relief of sob that does not improve by walking a slower pace or resting but rather if the pt does not improve after trying this first. 2) If the pt is convinced, as many are, that saba helps recover from activity faster then it's easy to tell if this is the case by re-challenging : ie stop, take the inhaler, then p 5 minutes try the exact same activity (intensity of workload) that just caused the symptoms and see if they are substantially diminished or not after saba 3) if there is an activity that reproducibly causes the symptoms, try the saba 15 min before the activity on alternate days   If in fact the saba really does help, then fine to continue to use it prn but advised may need to look closer at the maintenance regimen being used to achieve better control of airways disease with exertion.

## 2023-03-07 NOTE — Assessment & Plan Note (Signed)
Started on 02 around 2019 As of 02/03/2020  2lpm 24/7  -  02/03/2020 referred to adapt for Best fit for amb 02 > 3lpm pulsed tank -  04/28/2020   Walked 3lpm pulsed tank  approx   400 ft  @ moderate pace  stopped due to  Sob/tired  with sats still 95%    -  09/15/2020   Walked 3lpm cont approx   200 ft  @ slow/slt undsteady  pace  stopped due to  Sob/fatigue   With sats 91% at rest sats were 86% RA   12/08/2020   Walked RA  approx   500 ft  @ moderate pace  stopped due to  End of study, a little sob, sats still 90%  - 04/05/2022 patient walked at a moderate pace on room air all three laps @ 150 ft each. Desat to 87% at end of third lap. Patient placed back on 3LO2 cont at end of walk  - 09/06/2022   Walked on 2lpm cont   x  3  lap(s) =  approx 450  ft  @ moderate pace, stopped due to end of study  with lowest 02 sats 93%   - 03/07/2023   Walked on 3lpm   x  2  lap(s) =  approx 300  ft  @ nl  pace, stopped due to sob with lowest 02 sats 88%   Rec: Saba prior to ex as per copd eval  Adjust 02 or pace to maintain sats > 90% Proceed with CT chest as planned this month   Each maintenance medication was reviewed in detail including emphasizing most importantly the difference between maintenance and prns and under what circumstances the prns are to be triggered using an action plan format where appropriate.  Total time for H and P, chart review, counseling, reviewing hfa/neb/02 /pulse ox  device(s) , directly observing portions of ambulatory 02 saturation study/ and generating customized AVS unique to this office visit / same day charting  > 30 min for   refractory respiratory  symptoms

## 2023-03-08 DIAGNOSIS — Z515 Encounter for palliative care: Secondary | ICD-10-CM | POA: Diagnosis not present

## 2023-03-08 DIAGNOSIS — Z7984 Long term (current) use of oral hypoglycemic drugs: Secondary | ICD-10-CM | POA: Diagnosis not present

## 2023-03-08 DIAGNOSIS — F1390 Sedative, hypnotic, or anxiolytic use, unspecified, uncomplicated: Secondary | ICD-10-CM | POA: Diagnosis not present

## 2023-03-08 DIAGNOSIS — E1121 Type 2 diabetes mellitus with diabetic nephropathy: Secondary | ICD-10-CM | POA: Diagnosis not present

## 2023-03-08 DIAGNOSIS — I4891 Unspecified atrial fibrillation: Secondary | ICD-10-CM | POA: Diagnosis not present

## 2023-03-08 DIAGNOSIS — I700 Atherosclerosis of aorta: Secondary | ICD-10-CM | POA: Diagnosis not present

## 2023-03-08 DIAGNOSIS — D6869 Other thrombophilia: Secondary | ICD-10-CM | POA: Diagnosis not present

## 2023-03-08 DIAGNOSIS — J449 Chronic obstructive pulmonary disease, unspecified: Secondary | ICD-10-CM | POA: Diagnosis not present

## 2023-03-08 DIAGNOSIS — Z6826 Body mass index (BMI) 26.0-26.9, adult: Secondary | ICD-10-CM | POA: Diagnosis not present

## 2023-04-01 ENCOUNTER — Ambulatory Visit (HOSPITAL_COMMUNITY)
Admission: RE | Admit: 2023-04-01 | Discharge: 2023-04-01 | Disposition: A | Discharge status: Home / Self Care | Payer: PPO | Source: Ambulatory Visit | Attending: Acute Care | Admitting: Acute Care

## 2023-04-01 DIAGNOSIS — R918 Other nonspecific abnormal finding of lung field: Secondary | ICD-10-CM | POA: Insufficient documentation

## 2023-04-01 DIAGNOSIS — Z87891 Personal history of nicotine dependence: Secondary | ICD-10-CM | POA: Insufficient documentation

## 2023-04-15 ENCOUNTER — Encounter (INDEPENDENT_AMBULATORY_CARE_PROVIDER_SITE_OTHER): Payer: Self-pay | Admitting: Otolaryngology

## 2023-04-15 ENCOUNTER — Telehealth: Payer: Self-pay | Admitting: Acute Care

## 2023-04-15 ENCOUNTER — Ambulatory Visit (INDEPENDENT_AMBULATORY_CARE_PROVIDER_SITE_OTHER): Payer: PPO | Admitting: Otolaryngology

## 2023-04-15 VITALS — Ht 67.0 in | Wt 168.0 lb

## 2023-04-15 DIAGNOSIS — R0981 Nasal congestion: Secondary | ICD-10-CM | POA: Diagnosis not present

## 2023-04-15 DIAGNOSIS — J320 Chronic maxillary sinusitis: Secondary | ICD-10-CM | POA: Insufficient documentation

## 2023-04-15 DIAGNOSIS — J322 Chronic ethmoidal sinusitis: Secondary | ICD-10-CM | POA: Insufficient documentation

## 2023-04-15 DIAGNOSIS — J310 Chronic rhinitis: Secondary | ICD-10-CM

## 2023-04-15 NOTE — Progress Notes (Signed)
Patient ID: [name], male   DOB: 10/02/1955, 67 y.o.   MRN: [id number]  Follow-up: Chronic maxillary and ethmoid sinusitis/polyposis  Procedure:  Flexible Nasal Endoscopy  Indication: The patient is a 67 year old male who returns today for his follow-up evaluation.  The patient has a history of chronic maxillary and ethmoid sinusitis and polyposis.  He underwent bilateral endoscopic sinus surgery, septoplasty, and turbinate reduction in August 2023.  The patient returns today reporting improvement in his nasal breathing.  He had 2 episodes of sinusitis over the past 6 months.  He was successfully treated with oral antibiotics.  He reports occasional nasal congestion, especially during the allergy months.  Currently he denies any facial pain, fever, or visual change.  Anesthesia: None  Description: Risks, benefits, and alternatives of flexible endoscopy were explained to the patient.  Specific mention was made of the risk of throat numbness with difficulty swallowing, possible bleeding from the nose and mouth, and pain from the procedure.  The patient gave oral consent to proceed.  The flexible scope was inserted into the right nasal cavity.  Endoscopy of the interior nasal cavity, superior, inferior, and middle meatus was performed. The sphenoid-ethmoid recess was examined. Edematous mucosa was noted.  No polyp, mass, or lesion was appreciated.  The sinus openings were patent.  Olfactory cleft was clear.  Nasopharynx was clear.  Turbinates were well-healed. The procedure was repeated on the contralateral side with similar findings.  The patient tolerated the procedure well.   Assessment: 1.  Chronic rhinitis with nasal mucosal congestion. 2.  The patient's maxillary and ethmoid sinuses are widely patent secondary to his previous surgery. 3.  No recurrent polyposis or acute infection is noted today.  Plan: 1.  The nasal endoscopy findings are reviewed with the patient. 2.  Flonase nasal spray  daily. 3.  Nasal saline irrigation as needed. 4.  The patient will return for reevaluation in 6 months.

## 2023-04-15 NOTE — Telephone Encounter (Signed)
Tiffany calling with call report. For CT scan.Tiffany phone number is [phone]. 

## 2023-04-15 NOTE — Telephone Encounter (Signed)
IMPRESSION: 1. Lung-RADS 4Bs, suspicious. Additional imaging evaluation or consultation with Pulmonology or Thoracic Surgery recommended. 2. S modifier above refers to the presence new superimposed areas of consolidative change are identified within the basilar right upper lobe, and peripheral lung bases. Findings are favored to represent an infectious or inflammatory process. Recommend repeat imaging in 3 months following appropriate antibiotic therapy and resolution of any symptoms the patient may be undergoing. Please use the following order CT CHEST LCS NODULE FOLLOW-UP W/O CM ). 3. Coronary artery calcifications. 4. Increase caliber of the main pulmonary artery measures 3.5 cm. This may be seen in the setting of pulmonary arterial hypertension. 5. Aortic Atherosclerosis (ICD10-I70.0) and Emphysema (ICD10-J43.9).

## 2023-04-17 ENCOUNTER — Telehealth: Payer: Self-pay | Admitting: Acute Care

## 2023-04-17 DIAGNOSIS — R911 Solitary pulmonary nodule: Secondary | ICD-10-CM

## 2023-04-17 DIAGNOSIS — E1129 Type 2 diabetes mellitus with other diabetic kidney complication: Secondary | ICD-10-CM | POA: Diagnosis not present

## 2023-04-17 DIAGNOSIS — R809 Proteinuria, unspecified: Secondary | ICD-10-CM | POA: Diagnosis not present

## 2023-04-17 DIAGNOSIS — K7581 Nonalcoholic steatohepatitis (NASH): Secondary | ICD-10-CM | POA: Diagnosis not present

## 2023-04-17 DIAGNOSIS — Z79899 Other long term (current) drug therapy: Secondary | ICD-10-CM | POA: Diagnosis not present

## 2023-04-17 NOTE — Telephone Encounter (Signed)
See other phone note

## 2023-04-17 NOTE — Telephone Encounter (Signed)
Called and spoke to pt. Appt scheduled to see Dr. Sherene Sires on 10/23 in [name], patient is aware. 3 month follow up scan order placed. Plan and results sent to PCP.   Will forward to Dr. Sherene Sires and Maralyn Sago as [name].

## 2023-04-17 NOTE — Telephone Encounter (Signed)
I have called the patient with the results of his low dose Ct Chest. I explained that his scan was read as a LR 4 B. There is a presence new superimposed areas of consolidative change are identified within the basilar right upper lobe, and peripheral lung bases. Findings are favored to represent an infectious or inflammatory process. Recommend repeat imaging in 3 months after antibiotic therapy.  Sherre Lain, and [place], please see if we can get him in to see Dr. Sherene Sires before the follow up scan to make sure he does not need antibiotic therapy.  Please place order for 3 month follow up due end of Dec. 2024, and fax results to PCP with plan of care. . Thanks so much

## 2023-04-17 NOTE — Telephone Encounter (Signed)
I have attempted to call the patient with the results of their  Low Dose CT Chest Lung cancer screening scan. There was no answer. I have left a HIPPA compliant VM requesting the patient call the office for the scan results. I included the office contact information in the message. We will await his return call. If no return call we will continue to call until patient is contacted.    It is okay to let the patient know when he calls back that his scan was read as a 4B however that radiology feels this is most likely infectious or inflammatory changes.  Check to see if he has been sick since the last scan.  Plan will be for 50-month follow-up which will be due after 07/01/2023.  Best plan will probably be to schedule between the 26 and 30 December so this remains on this calendar year insurance .  Please fax results to PCP and let them know plan is for 25-month follow-up as radiology feels this is infectious or inflammatory.  Dr. Sherene Sires I have included you on this note to make you aware that we are doing a 23-month follow-up as it looks like infectious/inflammatory changes are the cause of the new consolidations noted.

## 2023-04-23 ENCOUNTER — Ambulatory Visit: Payer: PPO | Admitting: General Surgery

## 2023-04-23 ENCOUNTER — Encounter: Payer: Self-pay | Admitting: General Surgery

## 2023-04-23 VITALS — BP 116/67 | HR 90 | Temp 97.7°F | Resp 18 | Ht 67.0 in | Wt 173.0 lb

## 2023-04-23 DIAGNOSIS — R911 Solitary pulmonary nodule: Secondary | ICD-10-CM | POA: Diagnosis not present

## 2023-04-23 DIAGNOSIS — J440 Chronic obstructive pulmonary disease with acute lower respiratory infection: Secondary | ICD-10-CM | POA: Diagnosis not present

## 2023-04-23 DIAGNOSIS — K4090 Unilateral inguinal hernia, without obstruction or gangrene, not specified as recurrent: Secondary | ICD-10-CM

## 2023-04-23 DIAGNOSIS — E1122 Type 2 diabetes mellitus with diabetic chronic kidney disease: Secondary | ICD-10-CM | POA: Diagnosis not present

## 2023-04-23 NOTE — Progress Notes (Signed)
Christopher [name]; [id number]; 1955/07/12   HPI Patient is a 67 year old white male who referred himself to my care for evaluation and treatment of left groin pain.  He thinks he has a left inguinal hernia.  He states he started having left groin pain several months ago.  Since that time, it seems to be easing.  It is made worse with straining or sitting up from a supine position.  No bulge has been noted.  No nausea or vomiting are noted.  He does have significant COPD with the need for 3 to 4 L of home O2.  His ambulation is somewhat limited secondary to his COPD.  He is followed by Dr. Sherene Sires of pulmonology.  He is status post a right inguinal repair in the remote past. Past Medical History:  Diagnosis Date   Anxiety    Arthritis    Asthma    Chest pain    a. 2003 Cath: nl cors.   Concussion    COPD (chronic obstructive pulmonary disease) ([name])    chronically on 3-4L [name]   Depression    Diabetes mellitus    Dyslipidemia    Dysrhythmia    Fracture 06/2014   lower back "L-1"   GERD (gastroesophageal reflux disease)    Hypertension    NASH (nonalcoholic steatohepatitis) 2018   [name], undergoing routine cirrhosis care; [name]   Pneumonia 12/01/2021   PONV (postoperative nausea and vomiting)    Splenomegaly    Thrombocytopenia ([name])     Past Surgical History:  Procedure Laterality Date   BIOPSY  07/26/2016   Procedure: BIOPSY;  Surgeon: [name], MD;  Location: [facility];  Service: Endoscopy;;  gastric   CARDIAC CATHETERIZATION  10/22/2001   normal coronary arteriea (Dr. Aram Candela)   COLONOSCOPY  08/31/2011   [id number] rectal and colon polyps-treated. Single tubular adenoma. Next TCS 08/2016   COLONOSCOPY N/A 07/26/2016   Dr. [name]: diverticulosis in sigmoid and descending colon. 6 mm benign splenic flexure. surveillance 5 yeras   COLONOSCOPY WITH PROPOFOL N/A 08/07/2021   Surgeon: [name], DO;  Nonbleeding  internal hemorrhoids, three 3-6 mm polyps in the transverse colon resected and retrieved, otherwise normal exam.  Pathology with 1 tubular adenoma, otherwise benign colonic mucosa.  Recommended 5-year repeat.   ESOPHAGOGASTRODUODENOSCOPY N/A 07/26/2016   Dr. [name]: normal esophagus, portal gastropathy, chronic gastritis, normal duodenum, screening EGD 2 years    ESOPHAGOGASTRODUODENOSCOPY (EGD) WITH ESOPHAGEAL DILATION N/A 05/15/2013   [name] esophagus-status post Elease Hashimoto dilation/Portal gastropathy. Antral erosions-status post biopsy. extrinsic compression along lesser curvature likely secondary to splenomegaly. gastric bx benign   ESOPHAGOGASTRODUODENOSCOPY (EGD) WITH PROPOFOL N/A 12/12/2020   Surgeon: [name], MD; ormal esophagus s/p dilation, portal hypertensive gastropathy.  Due for repeat in 2024.   ETHMOIDECTOMY Bilateral 03/02/2022   Procedure: ETHMOIDECTOMY;  Surgeon: [name], MD;  Location: [facility] OR;  Service: ENT;  Laterality: Bilateral;   HERNIA REPAIR  03/2009   MALONEY DILATION N/A 12/12/2020   Procedure: Elease Hashimoto DILATION;  Surgeon: [name], MD;  [facility]: [facility];  Service: Endoscopy;  Laterality: N/A;   MAXILLARY ANTROSTOMY Bilateral 03/02/2022   Procedure: MAXILLARY ANTROSTOMY;  Surgeon: [name], MD;  Location: [facility];  Service: ENT;  Laterality: Bilateral;   NASAL SEPTOPLASTY W/ TURBINOPLASTY Bilateral 03/02/2022   Procedure: NASAL SEPTOPLASTY WITH TURBINATE REDUCTION;  Surgeon: [name], MD;  Location: [facility] OR;  Service: ENT;  Laterality: Bilateral;  POLYPECTOMY  07/26/2016   Procedure: POLYPECTOMY;  Surgeon: [name], MD;  [facility]: [facility];  Service: Endoscopy;;  colon   POLYPECTOMY  08/07/2021   Procedure: POLYPECTOMY;  Surgeon: [name], DO;  Location: [facility];  Service: Endoscopy;;   SINUS ENDO WITH FUSION N/A 03/02/2022   Procedure: SINUS ENDO WITH FUSION;  Surgeon: [name], MD;  Location: [facility];  Service: ENT;  Laterality:  N/A;   TRANSTHORACIC ECHOCARDIOGRAM  2011   EF 50-55%, stage 1 diastolic dysfunction, trace TR & pulm valve regurg    VASECTOMY  1989    Family History  Problem Relation Age of Onset   Heart disease Mother    Cancer Mother        lymph nodes   Diabetes Mother    Arthritis Other    Lung disease Other    Asthma Other    Kidney disease Other    Ovarian cancer Sister    Diabetes Brother    Heart disease Brother    Hypertension Brother    Diabetes Sister    Diabetes Sister    Cirrhosis Sister 76       no etoh   Colon cancer Neg Hx     Current Outpatient Medications on File Prior to Visit  Medication Sig Dispense Refill   acetaminophen (TYLENOL) 500 MG tablet Take 1,000 mg by mouth every 6 (six) hours as needed for headache or moderate pain.     albuterol (PROVENTIL) (2.5 MG/3ML) 0.083% nebulizer solution Take 3 mLs (2.5 mg total) by nebulization every 2 (two) hours as needed for wheezing or shortness of breath (cough). 75 mL 1   albuterol (VENTOLIN HFA) 108 (90 Base) MCG/ACT inhaler INHALE 2 PUFFS INTO THE LUNGS EVERY 4 HOURS AS NEEDED FOR WHEEZING OR SHORTNESS OF BREATH. 8.5 each 12   ALPRAZolam (XANAX) 0.25 MG tablet Take 0.25 mg by mouth 2 (two) times daily as needed for sleep or anxiety.     atorvastatin (LIPITOR) 40 MG tablet Take 40 mg by mouth in the morning.     Budeson-Glycopyrrol-Formoterol (BREZTRI AEROSPHERE) 160-9-4.8 MCG/ACT AERO Inhale 2 puffs into the lungs 2 (two) times daily. 10.7 g 11   budesonide-formoterol (SYMBICORT) 160-4.5 MCG/ACT inhaler INHALE 2 PUFFS INTO THE LUNGS TWICE A DAY 10.2 g 11   Cholecalciferol (VITAMIN D3) 25 MCG (1000 UT) CAPS Take 1,000 Units by mouth in the morning.     diltiazem (CARDIZEM CD) 240 MG 24 hr capsule Take 240 mg by mouth in the morning.     famotidine (PEPCID) 20 MG tablet One after supper 30 tablet 11   ferrous sulfate 325 (65 FE) MG tablet Take 325 mg by mouth in the morning.     fluticasone (FLONASE) 50 MCG/ACT nasal spray  Place 1 spray into both nostrils daily as needed for allergies or rhinitis.     gabapentin (NEURONTIN) 300 MG capsule Take 1 capsule (300 mg total) by mouth at bedtime. 30 capsule 5   glimepiride (AMARYL) 1 MG tablet Take 1 mg by mouth daily with breakfast.     losartan (COZAAR) 25 MG tablet Take 25 mg by mouth daily.     metFORMIN (GLUCOPHAGE) 1000 MG tablet Take 1,000 mg by mouth daily with supper.     Multiple Vitamin (MULTIVITAMIN WITH MINERALS) TABS tablet Take 1 tablet by mouth at bedtime. 30 tablet 2   ondansetron (ZOFRAN-ODT) 4 MG disintegrating tablet Take 4 mg by mouth every 6 (six) hours as needed.  OXYGEN Inhale 3 L into the lungs continuous.     pantoprazole (PROTONIX) 40 MG tablet Take 1 tablet (40 mg total) by mouth in the morning. 90 tablet 3   pioglitazone (ACTOS) 15 MG tablet Take 15 mg by mouth daily.     sertraline (ZOLOFT) 50 MG tablet Take 1 tablet (50 mg total) by mouth daily. (Patient taking differently: Take 50 mg by mouth in the morning.) 30 tablet 3   tamsulosin (FLOMAX) 0.4 MG CAPS capsule Take 0.4 mg by mouth in the morning.     thiamine 100 MG tablet Take 1 tablet (100 mg total) by mouth daily. (Patient taking differently: Take 100 mg by mouth in the morning.) 30 tablet 2   vitamin C (ASCORBIC ACID) 250 MG tablet Take 250 mg by mouth in the morning.     No current facility-administered medications on file prior to visit.    Allergies  Allergen Reactions   Aspirin Other (See Comments)    Epistaxis    Lioresal [Baclofen] Other (See Comments)    Lethargy     Zocor [Simvastatin] Nausea Only    Social History   Substance and Sexual Activity  Alcohol Use No    Social History   Tobacco Use  Smoking Status Former   Current packs/day: 0.00   Average packs/day: 1.3 packs/day for 36.0 years (45.0 ttl pk-yrs)   Types: Cigarettes   Start date: 04/1975   Quit date: 04/2011   Years since quitting: 12.0  Smokeless Tobacco Former    Review of Systems   Constitutional: Negative.   HENT:  Positive for sinus pain.   Eyes: Negative.   Respiratory:  Positive for cough, shortness of breath and wheezing.   Cardiovascular: Negative.   Gastrointestinal:  Positive for abdominal pain.  Genitourinary: Negative.   Musculoskeletal:  Positive for back pain and joint pain.  Skin: Negative.   Neurological:  Positive for sensory change.  Endo/Heme/Allergies:  Bruises/bleeds easily.    Objective   Vitals:   04/23/23 1247  BP: 116/67  Pulse: 90  Resp: 18  Temp: 97.7 F (36.5 C)  SpO2: 91%    Physical Exam Vitals reviewed.  Constitutional:      Appearance: Normal appearance. He is not ill-appearing.  HENT:     Head: Normocephalic and atraumatic.  Cardiovascular:     Rate and Rhythm: Normal rate and regular rhythm.     Heart sounds: Normal heart sounds. No murmur heard.    No friction rub. No gallop.  Pulmonary:     Effort: Pulmonary effort is normal. No respiratory distress.     Breath sounds: Normal breath sounds. No stridor. No wheezing, rhonchi or rales.     Comments: Barrel chest noted Abdominal:     General: There is no distension.     Palpations: Abdomen is soft. There is no mass.     Tenderness: There is no abdominal tenderness. There is no guarding or rebound.     Hernia: A hernia is present.     Comments: Small indistinct left inguinal hernia present.  There is laxity of the right inguinal ring.  Genitourinary:    Testes: Normal.  Skin:    General: Skin is warm and dry.  Neurological:     Mental Status: He is alert and oriented to person, place, and time.    Pulmonology notes reviewed Assessment  Left groin pain which may be secondary to muscular strain versus a small left inguinal hernia.  Multiple comorbidities including significant pulmonary  disease Plan  I told the patient at this point I would not recommend a left inguinal herniorrhaphy with mesh as he would need to be under general anesthesia and his pulmonary  status is very precarious.  He understands and agrees.  I think the risk of incarceration is very low.  I told him we could revisit this in the future should his symptoms worsen.  Should he require surgery, he would need clearance from pulmonology.  Will follow-up prn.

## 2023-04-30 NOTE — Progress Notes (Unsigned)
[name], male    DOB: [birth year]     MRN: [id number]   Brief patient profile:  11 yowm MM/quit smoking 2012 with COPD GOLD III criteria 06/17/20 previously followed by Dr [name] referred back to pulmonary clinic 12/09/2019 in [place].   History of Present Illness  12/09/2019  Pulmonary/ 1st [name] eval/[name]  symbicort 160/spiriva  Chief Complaint  Patient presents with   Pulmonary Consult    Former patient of Dr [name].  Breathing has been worse over the past few wks. Recently treated with Doxy per PCP for COPD exacerbation. He is occ coughing up some green sputum.   flare 11/23/19  rx doxy 5/19 x 7 days  Dyspnea:  Baseline uses [employer] parking / MMR[employer]3 = can't walk 100 yards even at a slow pace at a flat grade s stopping due to sob  On 2lpm  Cough: improved less purulent purulent  Sleep: wakes up coughing since onset /sometimes uses saba proair  SABA use: rarely daytime rec Continue protonix 40 mg Take 30-60 min before first meal of the day  GERD diet  Plan A = Automatic = Always=    Budesonide/formoterol (symbicort ) 2 puffs first thing in am and 12 hours later and use spiriva x 2 pffs after the morning symbicort (ok to change back to the powder after 2 weeks but will need to stop it if you start coughing  Work on inhaler technique  Plan B = Backup (to supplement plan A, not to replace it) Only use your albuterol inhaler as a rescue medication    Plan C = Crisis (instead of Plan B   Make sure you check your oxygen saturations at highest level of activity to be sure it stays over 90%   Prednisone 10 mg take  4 each am x 2 days,   2 each am x 2 days,  1 each am x 2 days and stop  For cough > mucinex dm 1200 mg every 12 hours as needed  Would wait another 2 weeks before you get your first shot for covid 19    [name] eval 08/01/20 rec ST eval done 08/05/20: low risk asp SLP Diet Recommendations Regular solids;Thin liquid  Liquid Administration via Straw;Cup  Medication Administration  Whole meds with liquid  Compensations Slow rate;Small sips/bites  Postural Changes Seated upright at 90 degrees;Remain semi-upright after after feeds/meals (Comment)     [name] 03/02/22 FESS   04/05/2022  f/u ov/[name]/[name] re: GOLD 3 copd maint on symb/spiriva = 240 dollars Chief Complaint  Patient presents with   Follow-up    Saw Dr. Suszanne [name] and had surgery and is doing much better with his breathing   Dyspnea:  mailbox and back using 02 sometimes/  Cough: overall better since sinus surgery  Sleeping: noct better on bed blocks  SABA use: much less hfa/ never neb  02: 3lpm hs / sitting  and walks off it rec Plan A = Automatic = Always=    Breztri Take 2 puffs first thing in am and then another 2 puffs about 12 hours later.   Plan B = Backup (to supplement plan A, not to replace it) Only use your albuterol inhaler as a rescue medication  Plan C = Crisis (instead of Plan B but only if Plan B stops working) - only use your albuterol nebulizer if you first try Plan B and it fails to help Prednisone 10 mg take  4 each am x 2 days,   2  each am x 2 days,  1 each am x 2 days and stop  For cough mucinex dm 1200 mg every 12 hours as needed  Pantoprazole (protonix) 40 mg   Take  30-60 min before first meal of the day and Pepcid (famotidine)  20 mg (OTC) after supper until return to [name] GERD diet reviewed, bed blocks rec  If cough not better add another gabapentin 300 mg in am   Make sure you check your oxygen saturation  AT  your highest level of activity (not after you stop)   to be sure it stays over 90%       09/06/2022  f/u ov/[name]/[name] re: GOLD 3 copd /02 hs and prn  maint on breztri / humidified 02/ recurrent sinus infections not able to use [employer] / worse since 3 weeks prior to OV  rx by [name] with cloudy and tessalon  Chief Complaint  Patient presents with   Follow-up    Breathing was doing okay but then patient got sick and he is still trying to recover his  breathing   Dyspnea:  mb and back a football field flat / on 2lpm  Cough: was coughing just at hs/ then 3 weeks worse > mucus clear  Sleeping: bed blocks and pillows "almost upright"= 60 degrees  x years  SABA use: just once hfa / once neb  02: 3lpm / 3lpm cont walking  Covid status: vax all but last one  Lung cancer screening: in program  Rec Work on inhaler technique:   For cough > mucinex dm 1200  mg every 12 hours as needed When cough flares for any reason > add pepcid 20 mg after supper  Make sure you check your oxygen saturation  AT  your highest level of activity (not after you stop)   to be sure it stays over 90%       03/07/2023  f/u ov/[name]/[name] re: GOLD 3 / 02 hs and prn  MPNS  maint on breztri  / not taking pepcid any more Chief Complaint  Patient presents with   COPD    GOLD III  Dyspnea:  mb x 300 ft flat can do s stopping  Cough: worse at hs and in am p stirring but no excess / purulent sputum Sleeping: bed blocks and pillows s   resp cc  SABA use: hfa x once a week/ neb hardly any  02: 3lpm at hs , prn daytime   Lung cancer screening: due 03/2023  Rec Add pepcid 20 mg an hour before bedtime (automatically) Make sure you check your oxygen saturation  AT  your highest level of activity (not after you stop)   to be sure it stays over 90%  Also  Ok to try albuterol 15 min before an activity (on alternating days)  that you know would usually make you short of breath      Please schedule a follow up visit in 6  months but call sooner if needed    05/01/2023  f/u ov/[name]/[name] re: COPD GOLD 3/ 02 hs and prn  maint on ***  No chief complaint on file.   Dyspnea:  *** Cough: *** Sleeping: ***   resp cc  SABA use: *** 02: ***  Lung cancer screening: ***   No obvious day to day or daytime variability or assoc excess/ purulent sputum or mucus plugs or hemoptysis or cp or chest tightness, subjective wheeze or overt sinus or hb symptoms.  Also denies any obvious fluctuation of symptoms with weather or environmental changes or other aggravating or alleviating factors except as outlined above   No unusual exposure hx or h/o childhood pna/ asthma or knowledge of premature birth.  Current Allergies, Complete Past Medical History, Past Surgical History, Family History, and Social History were reviewed in [employer] record.  ROS  The following are not active complaints unless bolded Hoarseness, sore throat, dysphagia, dental problems, itching, sneezing,  nasal congestion or discharge of excess mucus or purulent secretions, ear ache,   fever, chills, sweats, unintended wt loss or wt gain, classically pleuritic or exertional cp,  orthopnea pnd or arm/hand swelling  or leg swelling, presyncope, palpitations, abdominal pain, anorexia, nausea, vomiting, diarrhea  or change in bowel habits or change in bladder habits, change in stools or change in urine, dysuria, hematuria,  rash, arthralgias, visual complaints, headache, numbness, weakness or ataxia or problems with walking or coordination,  change in mood or  memory.        No outpatient medications have been marked as taking for the 05/01/23 encounter (Appointment) with Nyoka Cowden, MD.               Past Medical History:  Diagnosis Date   Anxiety    Asthma    Chest pain    a. 2003 Cath: nl cors.   COPD (chronic obstructive pulmonary disease) ([name])    Depression    Diabetes mellitus    Dyslipidemia    Dysrhythmia    Fracture 12/15   lower back "L-1"   GERD (gastroesophageal reflux disease)    Hypertension    PONV (postoperative nausea and vomiting)    Splenomegaly        Objective:    Wt   05/01/2023       ***  03/07/2023        167   09/06/2022        163   04/05/2022        156 12/08/2021          164 09/01/2021        167  06/06/2021      162 12/08/2020          163 09/15/2020        154  05/23/2020      166 04/28/2020      166    12/30/19 162 lb (73.5 kg)  12/21/19 162 lb (73.5 kg)  12/09/19 162 lb (73.5 kg)   Vital signs reviewed  05/01/2023  - Note at rest 02 sats  ***% on ***   General appearance:    ***   Mod barr ***                Assessment

## 2023-05-01 ENCOUNTER — Encounter: Payer: Self-pay | Admitting: Internal Medicine

## 2023-05-01 ENCOUNTER — Ambulatory Visit: Payer: PPO | Admitting: Internal Medicine

## 2023-05-01 VITALS — BP 116/63 | HR 80 | Ht 67.0 in | Wt 170.0 lb

## 2023-05-01 DIAGNOSIS — Z87891 Personal history of nicotine dependence: Secondary | ICD-10-CM | POA: Diagnosis not present

## 2023-05-01 DIAGNOSIS — J9611 Chronic respiratory failure with hypoxia: Secondary | ICD-10-CM | POA: Diagnosis not present

## 2023-05-01 DIAGNOSIS — J449 Chronic obstructive pulmonary disease, unspecified: Secondary | ICD-10-CM

## 2023-05-01 NOTE — Assessment & Plan Note (Signed)
Quit smoking 2012 - alpha one AT screen  01/31/17  MM  Level 169 - PFT's  06/18/2019  FEV1 1.12 (35 % ) ratio 0.46  p 0 % improvement from saba p ? prior to study with DLCO  11.91 (48%) corrects to 3.18 (754%)  for alv volume and FV curve classic concavity   - [name] 03/02/22 FESS  - 03/07/2023  continue breztri - 05/01/2023  After extensive coaching inhaler device,  effectiveness =    85% hfa/ continue breztri and approp saba   No change rx needed

## 2023-05-01 NOTE — Assessment & Plan Note (Signed)
Quit 2012 so eligible for LDSCT until 2027  - LDSCT 04/01/23 abn ? In setting of aecopd > rec repeat at 3 m  CT results reviewed with pt >>> Too small for PET or bx, not suspicious enough for excisional bx > really only option for now is follow the Fleischner society guidelines as rec by radiology = 3 m comparison ct hopefully when not acutely ill next time/ advised   Each maintenance medication was reviewed in detail including emphasizing most importantly the difference between maintenance and prns and under what circumstances the prns are to be triggered using an action plan format where appropriate.  Total time for H and P, chart review, counseling, reviewing hfa/02/pulse ox device(s) , directly observing portions of ambulatory 02 saturation study/ and generating customized AVS unique to this office visit / same day charting > 30 min

## 2023-05-01 NOTE — Assessment & Plan Note (Signed)
Started on 02 around 2019 As of 02/03/2020  2lpm 24/7  -  02/03/2020 referred to [facility] for Best fit for amb 02 > 3lpm pulsed tank -  04/28/2020   Walked 3lpm pulsed tank  approx   400 ft  @ moderate pace  stopped due to  Sob/tired  with sats still 95%    -  09/15/2020   Walked 3lpm cont approx   200 ft  @ slow/slt undsteady  pace  stopped due to  Sob/fatigue   With sats 91% at rest sats were 86% RA   12/08/2020   Walked RA  approx   500 ft  @ moderate pace  stopped due to  End of study, a little sob, sats still 90%  - 04/05/2022 patient walked at a moderate pace on room air all three laps @ 150 ft each. Desat to 87% at end of third lap. Patient placed back on 3LO2 cont at end of walk  - 09/06/2022   Walked on 2lpm cont   x  3  lap(s) =  approx 450  ft  @ moderate pace, stopped due to end of study  with lowest 02 sats 93%   - 03/07/2023   Walked on 3lpm   x  2  lap(s) =  approx 300  ft  @ nl  pace, stopped due to sob with lowest 02 sats 88%   -  05/01/2023   Walked on 2lpom con  x  3  lap(s) =  approx 450  ft  @ mod to slow pace, stopped due to sob  with lowest 02 sats 91%      Eligble for POC if available but advised: Make sure you check your oxygen saturation  AT  your highest level of activity (not after you stop)   to be sure it stays over 90% and adjust  02 flow upward to maintain this level if needed but remember to turn it back to previous settings when you stop (to conserve your supply).

## 2023-05-01 NOTE — Patient Instructions (Addendum)
For cough/ congestion > mucinex or mucinex dm up to maximum of  1200 mg every 12 hours as needed   We can order best fit for portable 02 thru [facility]  Remember to make sure you check your oxygen saturation  AT  your highest level of activity (not after you stop)   to be sure it stays over 90% and adjust  02 flow upward to maintain this level if needed but remember to turn it back to previous settings when you stop (to conserve your supply).   Please schedule a follow up visit in 6  months but call sooner if needed

## 2023-05-07 ENCOUNTER — Other Ambulatory Visit: Payer: Self-pay | Admitting: Internal Medicine

## 2023-05-08 ENCOUNTER — Other Ambulatory Visit: Payer: Self-pay | Admitting: Gastroenterology

## 2023-05-08 ENCOUNTER — Telehealth: Payer: Self-pay | Admitting: *Deleted

## 2023-05-08 DIAGNOSIS — K219 Gastro-esophageal reflux disease without esophagitis: Secondary | ICD-10-CM

## 2023-05-08 MED ORDER — PANTOPRAZOLE SODIUM 40 MG PO TBEC: 40.0000 mg | DELAYED_RELEASE_TABLET | Freq: Every morning | ORAL | 0 refills | Status: DC

## 2023-05-08 NOTE — Telephone Encounter (Signed)
Rx sent.   Can we get patient scheduled for routine follow-up?

## 2023-05-08 NOTE — Telephone Encounter (Signed)
Refill request for pantoprazole. Send to [facility] in [place]. Pt last OV 08/30/2022

## 2023-05-10 NOTE — Telephone Encounter (Signed)
Left patient a message to scehdule a routine OV

## 2023-05-10 NOTE — Telephone Encounter (Signed)
Patient is calling about his  refill for [facility] contact [facility] on [address], [place] and call patient at [phone]

## 2023-05-13 NOTE — Progress Notes (Unsigned)
Referring Provider: Carylon Perches, MD Primary Care Physician:  Carylon Perches, MD Primary GI Physician: Dr. [name]  Chief Complaint  Patient presents with   Follow-up    Follow up. Check up for fatty liver. Not having any problems.     HPI:   [name] is a 67 y.o. male  with history significant for chronic respiratory failure on supplemental oxygen (3L), type 2 diabetes, HTN, GERD, adenomatous colon polyps, hemorrhoids with intermittent rectal bleeding, biopsy-proven NASH fibrosis in 2018, elastography [name], chronic thrombocytopenia following with hematology, splenomegaly, portal gastropathy, currently being followed for routine cirrhosis care, presenting today for follow-up and to discuss scheduling surveillance EGD.    Prior EGD June 2022 with normal esophagus s/p dilation, portal hypertensive gastropathy.  Due for repeat in 2024.   Last colonoscopy 08/07/2021: Nonbleeding internal hemorrhoids, three 3-6 mm polyps in the transverse colon resected and retrieved, otherwise normal exam.  Pathology with 1 tubular adenoma, otherwise benign colonic mucosa.  Recommended 5-year repeat.   Last ultrasound 09/10/2022 with increased hepatic parenchymal echogenicity suggestive of steatosis.  Last labs 08/30/22: MELD 3.0,  6   Today:  Cirrhosis:  Notes some chronic swelling in his right foot.  Reports history of an injury years ago with getting his foot pinned.  States he does not eat salt as he does not like it.  Nothing routinely out of a can or deli meats.  Denies abdominal distention, mental status changes, yellowing of the eyes or skin, BRBPR, melena.   GERD:  Doing well with pantoprazole 40 mg daily and famotidine 20 mg at night.  No dysphagia.  Bowels are moving well.  No other GI concerns.   Reports respiratory status is at baseline.  Uses 2-3 L supplemental O2 via nasal cannula.   Past Medical History:  Diagnosis Date   Anxiety    Arthritis    Asthma    Chest pain    a.  2003 Cath: nl cors.   Concussion    COPD (chronic obstructive pulmonary disease) ([name])    chronically on 3-4L [place]   Depression    Diabetes mellitus    Dyslipidemia    Dysrhythmia    Fracture 06/2014   lower back "L-1"   GERD (gastroesophageal reflux disease)    Hypertension    NASH (nonalcoholic steatohepatitis) 2018   [name], undergoing routine cirrhosis care; [name]   Pneumonia 12/01/2021   PONV (postoperative nausea and vomiting)    Splenomegaly    Thrombocytopenia ([name])     Past Surgical History:  Procedure Laterality Date   BIOPSY  07/26/2016   Procedure: BIOPSY;  Surgeon: [name], MD;  Location: [facility];  Service: Endoscopy;;  gastric   CARDIAC CATHETERIZATION  10/22/2001   normal coronary arteriea (Dr. Aram Candela)   COLONOSCOPY  08/31/2011   [id number] rectal and colon polyps-treated. Single tubular adenoma. Next TCS 08/2016   COLONOSCOPY N/A 07/26/2016   Dr. [name]: diverticulosis in sigmoid and descending colon. 6 mm benign splenic flexure. surveillance 5 yeras   COLONOSCOPY WITH PROPOFOL N/A 08/07/2021   Surgeon: [name], DO;  Nonbleeding internal hemorrhoids, three 3-6 mm polyps in the transverse colon resected and retrieved, otherwise normal exam.  Pathology with 1 tubular adenoma, otherwise benign colonic mucosa.  Recommended 5-year repeat.   ESOPHAGOGASTRODUODENOSCOPY N/A 07/26/2016   Dr. [name]: normal esophagus, portal gastropathy, chronic gastritis, normal duodenum, screening EGD 2 years    ESOPHAGOGASTRODUODENOSCOPY (  EGD) WITH ESOPHAGEAL DILATION N/A 05/15/2013   [id number] esophagus-status post Elease Hashimoto dilation/Portal gastropathy. Antral erosions-status post biopsy. extrinsic compression along lesser curvature likely secondary to splenomegaly. gastric bx benign   ESOPHAGOGASTRODUODENOSCOPY (EGD) WITH PROPOFOL N/A 12/12/2020   Surgeon: [name], MD; ormal esophagus s/p dilation, portal  hypertensive gastropathy.  Due for repeat in 2024.   ETHMOIDECTOMY Bilateral 03/02/2022   Procedure: ETHMOIDECTOMY;  Surgeon: [name], MD;  Location: [facility];  Service: ENT;  Laterality: Bilateral;   HERNIA REPAIR  03/2009   MALONEY DILATION N/A 12/12/2020   Procedure: Elease Hashimoto DILATION;  Surgeon: [name], MD;  Location: [facility];  Service: Endoscopy;  Laterality: N/A;   MAXILLARY ANTROSTOMY Bilateral 03/02/2022   Procedure: MAXILLARY ANTROSTOMY;  Surgeon: [name], MD;  Location: [facility];  Service: ENT;  Laterality: Bilateral;   NASAL SEPTOPLASTY W/ TURBINOPLASTY Bilateral 03/02/2022   Procedure: NASAL SEPTOPLASTY WITH TURBINATE REDUCTION;  Surgeon: [name], MD;  Location: [facility];  Service: ENT;  Laterality: Bilateral;   POLYPECTOMY  07/26/2016   Procedure: POLYPECTOMY;  Surgeon: [name], MD;  Location: [facility];  Service: Endoscopy;;  colon   POLYPECTOMY  08/07/2021   Procedure: POLYPECTOMY;  Surgeon: [name], DO;  Location: [facility];  Service: Endoscopy;;   SINUS ENDO WITH FUSION N/A 03/02/2022   Procedure: SINUS ENDO WITH FUSION;  Surgeon: [name], MD;  Location: [facility];  Service: ENT;  Laterality: N/A;   TRANSTHORACIC ECHOCARDIOGRAM  2011   EF 50-55%, stage 1 diastolic dysfunction, trace TR & pulm valve regurg    VASECTOMY  1989    Current Outpatient Medications  Medication Sig Dispense Refill   acetaminophen (TYLENOL) 500 MG tablet Take 1,000 mg by mouth every 6 (six) hours as needed for headache or moderate pain.     albuterol (PROVENTIL) (2.5 MG/3ML) 0.083% nebulizer solution Take 3 mLs (2.5 mg total) by nebulization every 2 (two) hours as needed for wheezing or shortness of breath (cough). 75 mL 1   albuterol (VENTOLIN HFA) 108 (90 Base) MCG/ACT inhaler INHALE 2 PUFFS INTO THE LUNGS EVERY 4 HOURS AS NEEDED FOR WHEEZING OR SHORTNESS OF BREATH. 8.5 each 12   ALPRAZolam (XANAX) 0.25 MG tablet Take 0.25 mg by mouth 2 (two) times daily as needed for sleep  or anxiety.     atorvastatin (LIPITOR) 40 MG tablet Take 40 mg by mouth in the morning.     BREZTRI AEROSPHERE 160-9-4.8 MCG/ACT AERO INHALE 2 PUFFS INTO THE LUNGS TWICE A DAY 10.7 each 11   Cholecalciferol (VITAMIN D3) 25 MCG (1000 UT) CAPS Take 1,000 Units by mouth in the morning.     diltiazem (CARDIZEM CD) 240 MG 24 hr capsule Take 240 mg by mouth in the morning.     famotidine (PEPCID) 20 MG tablet One after supper 30 tablet 11   ferrous sulfate 325 (65 FE) MG tablet Take 325 mg by mouth in the morning.     fluticasone (FLONASE) 50 MCG/ACT nasal spray Place 1 spray into both nostrils daily as needed for allergies or rhinitis.     gabapentin (NEURONTIN) 300 MG capsule Take 1 capsule (300 mg total) by mouth at bedtime. 30 capsule 5   glimepiride (AMARYL) 1 MG tablet Take 1 mg by mouth daily with breakfast.     losartan (COZAAR) 25 MG tablet Take 25 mg by mouth daily.     metFORMIN (GLUCOPHAGE) 1000 MG tablet Take 1,000 mg by mouth daily with supper.  Multiple Vitamin (MULTIVITAMIN WITH MINERALS) TABS tablet Take 1 tablet by mouth at bedtime. 30 tablet 2   ondansetron (ZOFRAN-ODT) 4 MG disintegrating tablet Take 4 mg by mouth every 6 (six) hours as needed.     OXYGEN Inhale 3 L into the lungs continuous.     pantoprazole (PROTONIX) 40 MG tablet Take 1 tablet (40 mg total) by mouth in the morning. 90 tablet 0   pioglitazone (ACTOS) 15 MG tablet Take 15 mg by mouth daily.     sertraline (ZOLOFT) 50 MG tablet Take 1 tablet (50 mg total) by mouth daily. (Patient taking differently: Take 50 mg by mouth in the morning.) 30 tablet 3   tamsulosin (FLOMAX) 0.4 MG CAPS capsule Take 0.4 mg by mouth in the morning.     vitamin C (ASCORBIC ACID) 250 MG tablet Take 250 mg by mouth in the morning.     No current facility-administered medications for this visit.    Allergies as of 05/15/2023 - Review Complete 05/15/2023  Allergen Reaction Noted   Aspirin Other (See Comments) 03/01/2022   Lioresal  [baclofen] Other (See Comments) 07/24/2016   Zocor [simvastatin] Nausea Only 10/05/2013    Family History  Problem Relation Age of Onset   Heart disease Mother    Cancer Mother        lymph nodes   Diabetes Mother    Arthritis Other    Lung disease Other    Asthma Other    Kidney disease Other    Ovarian cancer Sister    Diabetes Brother    Heart disease Brother    Hypertension Brother    Diabetes Sister    Diabetes Sister    Cirrhosis Sister 32       no etoh   Colon cancer Neg Hx     Social History   Socioeconomic History   Marital status: Married    Spouse name: Dois Davenport   Number of children: 1   Years of education: Not on file   Highest education level: Not on file  Occupational History   Not on file  Tobacco Use   Smoking status: Former    Current packs/day: 0.00    Average packs/day: 1.3 packs/day for 36.0 years (45.0 ttl pk-yrs)    Types: Cigarettes    Start date: 04/1975    Quit date: 04/2011    Years since quitting: 12.1   Smokeless tobacco: Former  Building services engineer status: Never Used  Substance and Sexual Activity   Alcohol use: No   Drug use: No   Sexual activity: Not Currently  Other Topics Concern   Not on file  Social History Narrative   Not on file   Social Determinants of Health   Financial Resource Strain: Low Risk  (08/16/2020)   Overall Financial Resource Strain (CARDIA)    Difficulty of Paying Living Expenses: Not hard at all  Food Insecurity: No Food Insecurity (08/16/2020)   Hunger Vital Sign    Worried About Running Out of Food in the Last Year: Never true    Ran Out of Food in the Last Year: Never true  Transportation Needs: No Transportation Needs (08/16/2020)   PRAPARE - Administrator, Civil Service (Medical): No    Lack of Transportation (Non-Medical): No  Physical Activity: Sufficiently Active (08/16/2020)   Exercise Vital Sign    Days of Exercise per Week: 7 days    Minutes of Exercise per Session: 30 min   Stress: No  Stress Concern Present (08/16/2020)   [employer] of Occupational Health - Occupational Stress Questionnaire    Feeling of Stress : Not at all  Social Connections: Moderately Integrated (08/16/2020)   Social Connection and Isolation Panel [NHANES]    Frequency of Communication with Friends and Family: Three times a week    Frequency of Social Gatherings with Friends and Family: Three times a week    Attends Religious Services: More than 4 times per year    Active Member of Clubs or Organizations: No    Attends Banker Meetings: Never    Marital Status: Married    Review of Systems: Gen: Denies fever, chills, cold or flulike symptoms, presyncope, syncope. CV: Denies chest pain, palpitations. Resp: No shortness of breath on supplemental O2.  No cough. GI: See HPI  Heme: See HPI  Physical Exam: BP 136/67 (BP Location: Right Arm, Patient Position: Sitting, Cuff Size: Normal)   Pulse (!) 101   Temp (!) 97.5 F (36.4 C) (Temporal)   Ht 5\' 7"  (1.702 m)   Wt 171 lb 12.8 oz (77.9 kg)   BMI 26.91 kg/m  General:   Alert and oriented. No distress noted. Pleasant and cooperative.  Head:  Normocephalic and atraumatic. Eyes:  Conjuctiva clear without scleral icterus. Heart:  S1, S2 present without murmurs appreciated. Lungs:  Clear to auscultation bilaterally. No wheezes, rales, or rhonchi. No distress.  Abdomen:  +BS, full, soft, non-tender.No rebound or guarding. No HSM or masses noted. Msk:  Symmetrical without gross deformities. Normal posture. Extremities:  With 2+ pitting pedal edema bilaterally with 1+ edema extending up to mid shin.  Neurologic:  Alert and  oriented x4 Psych:  Normal mood and affect.    Assessment:  67 y.o. male  with history significant for chronic respiratory failure on supplemental oxygen (2-3L), type 2 diabetes, HTN, GERD, adenomatous colon polyps, hemorrhoids with intermittent rectal bleeding, biopsy-proven NASH fibrosis in 2018,  elastography [name], chronic thrombocytopenia following with hematology, splenomegaly, portal gastropathy, currently being followed for routine cirrhosis care, presenting today for follow-up and to discuss scheduling surveillance EGD.    NASH/Liver fibrosis/suspected cirrhosis:  No definitive cirrhosis on imaging, but clinically presenting as cirrhosis with evidence of portal hypertension. He has previously been fairly well compensated though he does have evidence of 1-2+ bilateral lower extremity pitting edema on exam today.  Otherwise, no symptoms of decompensation.  MELD 3.0 was 6 in February 2024. Will plan to update labs. As long as Cr/electrolytes within normal limits, we will plan to start low-dose diuretics.  He is also due for surveillance ultrasound as well as surveillance EGD.  Last EGD in June 2022 without varices but evidence of portal hypertensive gastropathy with recommendations to repeat in 2 years.  GERD: Well-controlled on pantoprazole 40 mg daily and famotidine 20 mg at bedtime.    Plan:  Proceed with upper endoscopy with propofol by Dr. [name] in near future. The risks, benefits, and alternatives have been discussed with the patient in detail. The patient states understanding and desires to proceed.  ASA 3 Abdominal ultrasound CBC, BMP, HFP, INR, AFP Pending lab results, we will plan to start Lasix and spironolactone for lower extremity edema. Nutrition recommendations:  High-protein diet from a primarily plant-based diet. Avoid red meat.  No raw or undercooked meat, seafood, or shellfish. Low-fat/cholesterol/carbohydrate diet. Limit sodium to no more than 2000 mg/day including everything that you eat and drink. Recommend at least 30 minutes of aerobic and resistance exercise 3  days/week as he is able. Continue pantoprazole 40 mg daily. Continue famotidine 20 mg at bedtime. Follow-up in 3 months or sooner if needed.   Ermalinda Memos, PA-C [facility]  Gastroenterology 05/15/2023

## 2023-05-13 NOTE — H&P (View-Only) (Signed)
Referring Provider: Carylon Perches, MD Primary Care Physician:  Carylon Perches, MD Primary GI Physician: Dr. [name]  Chief Complaint  Patient presents with   Follow-up    Follow up. Check up for fatty liver. Not having any problems.     HPI:   [name] is a 67 y.o. male  with history significant for chronic respiratory failure on supplemental oxygen (3L), type 2 diabetes, HTN, GERD, adenomatous colon polyps, hemorrhoids with intermittent rectal bleeding, biopsy-proven NASH fibrosis in 2018, elastography [name], chronic thrombocytopenia following with hematology, splenomegaly, portal gastropathy, currently being followed for routine cirrhosis care, presenting today for follow-up and to discuss scheduling surveillance EGD.    Prior EGD June 2022 with normal esophagus s/p dilation, portal hypertensive gastropathy.  Due for repeat in 2024.   Last colonoscopy 08/07/2021: Nonbleeding internal hemorrhoids, three 3-6 mm polyps in the transverse colon resected and retrieved, otherwise normal exam.  Pathology with 1 tubular adenoma, otherwise benign colonic mucosa.  Recommended 5-year repeat.   Last ultrasound 09/10/2022 with increased hepatic parenchymal echogenicity suggestive of steatosis.  Last labs 08/30/22: MELD 3.0,  6   Today:  Cirrhosis:  Notes some chronic swelling in his right foot.  Reports history of an injury years ago with getting his foot pinned.  States he does not eat salt as he does not like it.  Nothing routinely out of a can or deli meats.  Denies abdominal distention, mental status changes, yellowing of the eyes or skin, BRBPR, melena.   GERD:  Doing well with pantoprazole 40 mg daily and famotidine 20 mg at night.  No dysphagia.  Bowels are moving well.  No other GI concerns.   Reports respiratory status is at baseline.  Uses 2-3 L supplemental O2 via nasal cannula.   Past Medical History:  Diagnosis Date   Anxiety    Arthritis    Asthma    Chest pain    a.  2003 Cath: nl cors.   Concussion    COPD (chronic obstructive pulmonary disease) ([name])    chronically on 3-4L [place]   Depression    Diabetes mellitus    Dyslipidemia    Dysrhythmia    Fracture 06/2014   lower back "L-1"   GERD (gastroesophageal reflux disease)    Hypertension    NASH (nonalcoholic steatohepatitis) 2018   [name], undergoing routine cirrhosis care; [name]   Pneumonia 12/01/2021   PONV (postoperative nausea and vomiting)    Splenomegaly    Thrombocytopenia ([name])     Past Surgical History:  Procedure Laterality Date   BIOPSY  07/26/2016   Procedure: BIOPSY;  Surgeon: [name], MD;  [facility];  Service: Endoscopy;;  gastric   CARDIAC CATHETERIZATION  10/22/2001   normal coronary arteriea (Dr. Aram Candela)   COLONOSCOPY  08/31/2011   OEU:MPNTIRWE rectal and colon polyps-treated. Single tubular adenoma. Next TCS 08/2016   COLONOSCOPY N/A 07/26/2016   Dr. [name]: diverticulosis in sigmoid and descending colon. 6 mm benign splenic flexure. surveillance 5 yeras   COLONOSCOPY WITH PROPOFOL N/A 08/07/2021   Surgeon: [name], DO;  Nonbleeding internal hemorrhoids, three 3-6 mm polyps in the transverse colon resected and retrieved, otherwise normal exam.  Pathology with 1 tubular adenoma, otherwise benign colonic mucosa.  Recommended 5-year repeat.   ESOPHAGOGASTRODUODENOSCOPY N/A 07/26/2016   Dr. [name]: normal esophagus, portal gastropathy, chronic gastritis, normal duodenum, screening EGD 2 years    ESOPHAGOGASTRODUODENOSCOPY (  EGD) WITH ESOPHAGEAL DILATION N/A 05/15/2013   NWG:NFAOZH esophagus-status post Elease Hashimoto dilation/Portal gastropathy. Antral erosions-status post biopsy. extrinsic compression along lesser curvature likely secondary to splenomegaly. gastric bx benign   ESOPHAGOGASTRODUODENOSCOPY (EGD) WITH PROPOFOL N/A 12/12/2020   Surgeon: [name], MD; ormal esophagus s/p dilation, portal  hypertensive gastropathy.  Due for repeat in 2024.   ETHMOIDECTOMY Bilateral 03/02/2022   Procedure: ETHMOIDECTOMY;  Surgeon: [name], MD;  Location: [facility] OR;  Service: ENT;  Laterality: Bilateral;   HERNIA REPAIR  03/2009   MALONEY DILATION N/A 12/12/2020   Procedure: Elease Hashimoto DILATION;  Surgeon: [name], MD;  [facility];  Service: Endoscopy;  Laterality: N/A;   MAXILLARY ANTROSTOMY Bilateral 03/02/2022   Procedure: MAXILLARY ANTROSTOMY;  Surgeon: [name], MD;  Location: [facility] OR;  Service: ENT;  Laterality: Bilateral;   NASAL SEPTOPLASTY W/ TURBINOPLASTY Bilateral 03/02/2022   Procedure: NASAL SEPTOPLASTY WITH TURBINATE REDUCTION;  Surgeon: [name], MD;  Location: [facility] OR;  Service: ENT;  Laterality: Bilateral;   POLYPECTOMY  07/26/2016   Procedure: POLYPECTOMY;  Surgeon: [name], MD;  [facility];  Service: Endoscopy;;  colon   POLYPECTOMY  08/07/2021   Procedure: POLYPECTOMY;  Surgeon: [name], DO;  [facility];  Service: Endoscopy;;   SINUS ENDO WITH FUSION N/A 03/02/2022   Procedure: SINUS ENDO WITH FUSION;  Surgeon: [name], MD;  Location: [facility] OR;  Service: ENT;  Laterality: N/A;   TRANSTHORACIC ECHOCARDIOGRAM  2011   EF 50-55%, stage 1 diastolic dysfunction, trace TR & pulm valve regurg    VASECTOMY  1989    Current Outpatient Medications  Medication Sig Dispense Refill   acetaminophen (TYLENOL) 500 MG tablet Take 1,000 mg by mouth every 6 (six) hours as needed for headache or moderate pain.     albuterol (PROVENTIL) (2.5 MG/3ML) 0.083% nebulizer solution Take 3 mLs (2.5 mg total) by nebulization every 2 (two) hours as needed for wheezing or shortness of breath (cough). 75 mL 1   albuterol (VENTOLIN HFA) 108 (90 Base) MCG/ACT inhaler INHALE 2 PUFFS INTO THE LUNGS EVERY 4 HOURS AS NEEDED FOR WHEEZING OR SHORTNESS OF BREATH. 8.5 each 12   ALPRAZolam (XANAX) 0.25 MG tablet Take 0.25 mg by mouth 2 (two) times daily as needed for sleep  or anxiety.     atorvastatin (LIPITOR) 40 MG tablet Take 40 mg by mouth in the morning.     BREZTRI AEROSPHERE 160-9-4.8 MCG/ACT AERO INHALE 2 PUFFS INTO THE LUNGS TWICE A DAY 10.7 each 11   Cholecalciferol (VITAMIN D3) 25 MCG (1000 UT) CAPS Take 1,000 Units by mouth in the morning.     diltiazem (CARDIZEM CD) 240 MG 24 hr capsule Take 240 mg by mouth in the morning.     famotidine (PEPCID) 20 MG tablet One after supper 30 tablet 11   ferrous sulfate 325 (65 FE) MG tablet Take 325 mg by mouth in the morning.     fluticasone (FLONASE) 50 MCG/ACT nasal spray Place 1 spray into both nostrils daily as needed for allergies or rhinitis.     gabapentin (NEURONTIN) 300 MG capsule Take 1 capsule (300 mg total) by mouth at bedtime. 30 capsule 5   glimepiride (AMARYL) 1 MG tablet Take 1 mg by mouth daily with breakfast.     losartan (COZAAR) 25 MG tablet Take 25 mg by mouth daily.     metFORMIN (GLUCOPHAGE) 1000 MG tablet Take 1,000 mg by mouth daily with supper.  Multiple Vitamin (MULTIVITAMIN WITH MINERALS) TABS tablet Take 1 tablet by mouth at bedtime. 30 tablet 2   ondansetron (ZOFRAN-ODT) 4 MG disintegrating tablet Take 4 mg by mouth every 6 (six) hours as needed.     OXYGEN Inhale 3 L into the lungs continuous.     pantoprazole (PROTONIX) 40 MG tablet Take 1 tablet (40 mg total) by mouth in the morning. 90 tablet 0   pioglitazone (ACTOS) 15 MG tablet Take 15 mg by mouth daily.     sertraline (ZOLOFT) 50 MG tablet Take 1 tablet (50 mg total) by mouth daily. (Patient taking differently: Take 50 mg by mouth in the morning.) 30 tablet 3   tamsulosin (FLOMAX) 0.4 MG CAPS capsule Take 0.4 mg by mouth in the morning.     vitamin C (ASCORBIC ACID) 250 MG tablet Take 250 mg by mouth in the morning.     No current facility-administered medications for this visit.    Allergies as of 05/15/2023 - Review Complete 05/15/2023  Allergen Reaction Noted   Aspirin Other (See Comments) 03/01/2022   Lioresal  [baclofen] Other (See Comments) 07/24/2016   Zocor [simvastatin] Nausea Only 10/05/2013    Family History  Problem Relation Age of Onset   Heart disease Mother    Cancer Mother        lymph nodes   Diabetes Mother    Arthritis Other    Lung disease Other    Asthma Other    Kidney disease Other    Ovarian cancer Sister    Diabetes Brother    Heart disease Brother    Hypertension Brother    Diabetes Sister    Diabetes Sister    Cirrhosis Sister 78       no etoh   Colon cancer Neg Hx     Social History   Socioeconomic History   Marital status: Married    Spouse name: Dois Davenport   Number of children: 1   Years of education: Not on file   Highest education level: Not on file  Occupational History   Not on file  Tobacco Use   Smoking status: Former    Current packs/day: 0.00    Average packs/day: 1.3 packs/day for 36.0 years (45.0 ttl pk-yrs)    Types: Cigarettes    Start date: 04/1975    Quit date: 04/2011    Years since quitting: 12.1   Smokeless tobacco: Former  Building services engineer status: Never Used  Substance and Sexual Activity   Alcohol use: No   Drug use: No   Sexual activity: Not Currently  Other Topics Concern   Not on file  Social History Narrative   Not on file   Social Determinants of Health   Financial Resource Strain: Low Risk  (08/16/2020)   Overall Financial Resource Strain (CARDIA)    Difficulty of Paying Living Expenses: Not hard at all  Food Insecurity: No Food Insecurity (08/16/2020)   Hunger Vital Sign    Worried About Running Out of Food in the Last Year: Never true    Ran Out of Food in the Last Year: Never true  Transportation Needs: No Transportation Needs (08/16/2020)   PRAPARE - Administrator, Civil Service (Medical): No    Lack of Transportation (Non-Medical): No  Physical Activity: Sufficiently Active (08/16/2020)   Exercise Vital Sign    Days of Exercise per Week: 7 days    Minutes of Exercise per Session: 30 min   Stress: No  Stress Concern Present (08/16/2020)   [employer] of Occupational Health - Occupational Stress Questionnaire    Feeling of Stress : Not at all  Social Connections: Moderately Integrated (08/16/2020)   Social Connection and Isolation Panel [NHANES]    Frequency of Communication with Friends and Family: Three times a week    Frequency of Social Gatherings with Friends and Family: Three times a week    Attends Religious Services: More than 4 times per year    Active Member of Clubs or Organizations: No    Attends Banker Meetings: Never    Marital Status: Married    Review of Systems: Gen: Denies fever, chills, cold or flulike symptoms, presyncope, syncope. CV: Denies chest pain, palpitations. Resp: No shortness of breath on supplemental O2.  No cough. GI: See HPI  Heme: See HPI  Physical Exam: BP 136/67 (BP Location: Right Arm, Patient Position: Sitting, Cuff Size: Normal)   Pulse (!) 101   Temp (!) 97.5 F (36.4 C) (Temporal)   Ht 5\' 7"  (1.702 m)   Wt 171 lb 12.8 oz (77.9 kg)   BMI 26.91 kg/m  General:   Alert and oriented. No distress noted. Pleasant and cooperative.  Head:  Normocephalic and atraumatic. Eyes:  Conjuctiva clear without scleral icterus. Heart:  S1, S2 present without murmurs appreciated. Lungs:  Clear to auscultation bilaterally. No wheezes, rales, or rhonchi. No distress.  Abdomen:  +BS, full, soft, non-tender.No rebound or guarding. No HSM or masses noted. Msk:  Symmetrical without gross deformities. Normal posture. Extremities:  With 2+ pitting pedal edema bilaterally with 1+ edema extending up to mid shin.  Neurologic:  Alert and  oriented x4 Psych:  Normal mood and affect.    Assessment:  67 y.o. male  with history significant for chronic respiratory failure on supplemental oxygen (2-3L), type 2 diabetes, HTN, GERD, adenomatous colon polyps, hemorrhoids with intermittent rectal bleeding, biopsy-proven NASH fibrosis in 2018,  elastography [name], chronic thrombocytopenia following with hematology, splenomegaly, portal gastropathy, currently being followed for routine cirrhosis care, presenting today for follow-up and to discuss scheduling surveillance EGD.    NASH/Liver fibrosis/suspected cirrhosis:  No definitive cirrhosis on imaging, but clinically presenting as cirrhosis with evidence of portal hypertension. He has previously been fairly well compensated though he does have evidence of 1-2+ bilateral lower extremity pitting edema on exam today.  Otherwise, no symptoms of decompensation.  MELD 3.0 was 6 in February 2024. Will plan to update labs. As long as Cr/electrolytes within normal limits, we will plan to start low-dose diuretics.  He is also due for surveillance ultrasound as well as surveillance EGD.  Last EGD in June 2022 without varices but evidence of portal hypertensive gastropathy with recommendations to repeat in 2 years.  GERD: Well-controlled on pantoprazole 40 mg daily and famotidine 20 mg at bedtime.    Plan:  Proceed with upper endoscopy with propofol by Dr. [name] in near future. The risks, benefits, and alternatives have been discussed with the patient in detail. The patient states understanding and desires to proceed.  ASA 3 Abdominal ultrasound CBC, BMP, HFP, INR, AFP Pending lab results, we will plan to start Lasix and spironolactone for lower extremity edema. Nutrition recommendations:  High-protein diet from a primarily plant-based diet. Avoid red meat.  No raw or undercooked meat, seafood, or shellfish. Low-fat/cholesterol/carbohydrate diet. Limit sodium to no more than 2000 mg/day including everything that you eat and drink. Recommend at least 30 minutes of aerobic and resistance exercise 3  days/week as he is able. Continue pantoprazole 40 mg daily. Continue famotidine 20 mg at bedtime. Follow-up in 3 months or sooner if needed.   Ermalinda Memos, PA-C [facility]  Gastroenterology 05/15/2023

## 2023-05-15 ENCOUNTER — Encounter: Payer: Self-pay | Admitting: Gastroenterology

## 2023-05-15 ENCOUNTER — Encounter: Payer: Self-pay | Admitting: *Deleted

## 2023-05-15 ENCOUNTER — Other Ambulatory Visit: Payer: Self-pay | Admitting: Gastroenterology

## 2023-05-15 ENCOUNTER — Ambulatory Visit: Payer: PPO | Admitting: Gastroenterology

## 2023-05-15 ENCOUNTER — Telehealth: Payer: Self-pay | Admitting: *Deleted

## 2023-05-15 VITALS — BP 136/67 | HR 101 | Temp 97.5°F | Ht 67.0 in | Wt 171.8 lb

## 2023-05-15 DIAGNOSIS — K3189 Other diseases of stomach and duodenum: Secondary | ICD-10-CM | POA: Diagnosis not present

## 2023-05-15 DIAGNOSIS — K7400 Hepatic fibrosis, unspecified: Secondary | ICD-10-CM

## 2023-05-15 DIAGNOSIS — R600 Localized edema: Secondary | ICD-10-CM | POA: Diagnosis not present

## 2023-05-15 DIAGNOSIS — K7581 Nonalcoholic steatohepatitis (NASH): Secondary | ICD-10-CM

## 2023-05-15 DIAGNOSIS — K219 Gastro-esophageal reflux disease without esophagitis: Secondary | ICD-10-CM | POA: Diagnosis not present

## 2023-05-15 NOTE — Patient Instructions (Addendum)
Please have blood work plated at [facility] or [facility].  We will arrange for you to have an ultrasound of your abdomen.  We will arrange to have an upper endoscopy with Dr. [name] at [facility].  Pending her blood work results, we will plan to start you on Lasix and spironolactone for the swelling that you have in your lower legs.  Nutrition Recommendations:   High-protein diet from a primarily plant-based diet. Avoid red meat.  No raw or undercooked meat, seafood, or shellfish. Low-fat/cholesterol/carbohydrate diet. Limit sodium to no more than 2000 mg/day including everything that you eat and drink. Recommend at least 30 minutes of aerobic and resistance exercise 3 days/week.  I will plan to see you back in 3 months or sooner if needed.   Ermalinda Memos, PA-C [facility] Gastroenterology

## 2023-05-15 NOTE — Telephone Encounter (Signed)
LMOVM to return call.  [place] scheduled for Friday 05/24/23, arrive at 8:15 am to check in, NPO after midnight.  Need to schedule EGD, ASA 3 w/Dr.Rourk

## 2023-05-16 ENCOUNTER — Other Ambulatory Visit: Payer: Self-pay | Admitting: Gastroenterology

## 2023-05-16 DIAGNOSIS — R600 Localized edema: Secondary | ICD-10-CM

## 2023-05-16 DIAGNOSIS — K7400 Hepatic fibrosis, unspecified: Secondary | ICD-10-CM

## 2023-05-16 DIAGNOSIS — K7581 Nonalcoholic steatohepatitis (NASH): Secondary | ICD-10-CM

## 2023-05-16 MED ORDER — FUROSEMIDE 20 MG PO TABS: 20.0000 mg | ORAL_TABLET | Freq: Every day | ORAL | 3 refills | Status: DC

## 2023-05-16 MED ORDER — SPIRONOLACTONE 25 MG PO TABS: 25.0000 mg | ORAL_TABLET | Freq: Every day | ORAL | 3 refills | Status: DC

## 2023-05-16 NOTE — Patient Instructions (Addendum)
[name]  05/16/2023     @PREFPERIOPPHARMACY @   Your procedure is scheduled on  05/22/2023.   Report to [facility] at  0730 A.M.   Call this number if you have problems the morning of surgery:  [phone]  If you experience any cold or flu symptoms such as cough, fever, chills, shortness of breath, etc. between now and your scheduled surgery, please notify us at the above number.   Remember:     Use your nebulizer and your inhaler before you come and bring your rescue inhaler with you.    Follow the diet instructions given to you by the office.    You may drink clear liquids until 0530 am on 05/22/2023.        Clear liquids allowed are:                    Water, Juice (No red color; non-citric and without pulp; diabetics please choose diet or no sugar options), Carbonated beverages (diabetics please choose diet or no sugar options), Clear Tea (No creamer, milk, or cream, including half & half and powdered creamer), Black Coffee Only (No creamer, milk or cream, including half & half and powdered creamer), and Clear Sports drink (No red color; diabetics please choose diet or no sugar options)    Take these medicines the morning of surgery with A SIP OF WATER              xanax(if needed), diltiazem, pepcid, pantoprazole, sertraline, tamsulosin, zofran (if needed).     Do not wear jewelry, make-up or nail polish, including gel polish,  artificial nails, or any other type of covering on natural nails (fingers and  toes).  Do not wear lotions, powders, or perfumes, or deodorant.  Do not shave 48 hours prior to surgery.  Men may shave face and neck.  Do not bring valuables to the hospital.  [facility] is not responsible for any belongings or valuables.  Contacts, dentures or bridgework may not be worn into surgery.  Leave your suitcase in the car.  After surgery it may be brought to your room.  For patients admitted to the hospital, discharge time will be  determined by your treatment team.  Patients discharged the day of surgery will not be allowed to drive home and must have someone with them for 24 hours.    Special instructions:   DO NOT smoke tobacco or vape for 24 hours before your procedure.  Please read over the following fact sheets that you were given. Anesthesia Post-op Instructions and Care and Recovery After Surgery       Upper Endoscopy, Adult, Care After After the procedure, it is common to have a sore throat. It is also common to have: Mild stomach pain or discomfort. Bloating. Nausea. Follow these instructions at home: The instructions below may help you care for yourself at home. Your health care provider may give you more instructions. If you have questions, ask your health care provider. If you were given a sedative during the procedure, it can affect you for several hours. Do not drive or operate machinery until your health care provider says that it is safe. If you will be going home right after the procedure, plan to have a responsible adult: Take you home from the hospital or clinic. You will not be allowed to drive. Care for you for the time you are told. Follow instructions from your health care  provider about what you may eat and drink. Return to your normal activities as told by your health care provider. Ask your health care provider what activities are safe for you. Take over-the-counter and prescription medicines only as told by your health care provider. Contact a health care provider if you: Have a sore throat that lasts longer than one day. Have trouble swallowing. Have a fever. Get help right away if you: Vomit blood or your vomit looks like coffee grounds. Have bloody, black, or tarry stools. Have a very bad sore throat or you cannot swallow. Have difficulty breathing or very bad pain in your chest or abdomen. These symptoms may be an emergency. Get help right away. Call 911. Do not wait to see  if the symptoms will go away. Do not drive yourself to the hospital. Summary After the procedure, it is common to have a sore throat, mild stomach discomfort, bloating, and nausea. If you were given a sedative during the procedure, it can affect you for several hours. Do not drive until your health care provider says that it is safe. Follow instructions from your health care provider about what you may eat and drink. Return to your normal activities as told by your health care provider. This information is not intended to replace advice given to you by your health care provider. Make sure you discuss any questions you have with your health care provider. Document Revised: 10/04/2021 Document Reviewed: 10/04/2021 Elsevier Patient Education  2024 Elsevier Inc. Monitored Anesthesia Care, Care After The following information offers guidance on how to care for yourself after your procedure. Your health care provider may also give you more specific instructions. If you have problems or questions, contact your health care provider. What can I expect after the procedure? After the procedure, it is common to have: Tiredness. Little or no memory about what happened during or after the procedure. Impaired judgment when it comes to making decisions. Nausea or vomiting. Some trouble with balance. Follow these instructions at home: For the time period you were told by your health care provider:  Rest. Do not participate in activities where you could fall or become injured. Do not drive or use machinery. Do not drink alcohol. Do not take sleeping pills or medicines that cause drowsiness. Do not make important decisions or sign legal documents. Do not take care of children on your own. Medicines Take over-the-counter and prescription medicines only as told by your health care provider. If you were prescribed antibiotics, take them as told by your health care provider. Do not stop using the antibiotic  even if you start to feel better. Eating and drinking Follow instructions from your health care provider about what you may eat and drink. Drink enough fluid to keep your urine pale yellow. If you vomit: Drink clear fluids slowly and in small amounts as you are able. Clear fluids include water, ice chips, low-calorie sports drinks, and fruit juice that has water added to it (diluted fruit juice). Eat light and bland foods in small amounts as you are able. These foods include bananas, applesauce, rice, lean meats, toast, and crackers. General instructions  Have a responsible adult stay with you for the time you are told. It is important to have someone help care for you until you are awake and alert. If you have sleep apnea, surgery and some medicines can increase your risk for breathing problems. Follow instructions from your health care provider about wearing your sleep device: When you are sleeping.  This includes during daytime naps. While taking prescription pain medicines, sleeping medicines, or medicines that make you drowsy. Do not use any products that contain nicotine or tobacco. These products include cigarettes, chewing tobacco, and vaping devices, such as e-cigarettes. If you need help quitting, ask your health care provider. Contact a health care provider if: You feel nauseous or vomit every time you eat or drink. You feel light-headed. You are still sleepy or having trouble with balance after 24 hours. You get a rash. You have a fever. You have redness or swelling around the IV site. Get help right away if: You have trouble breathing. You have new confusion after you get home. These symptoms may be an emergency. Get help right away. Call 911. Do not wait to see if the symptoms will go away. Do not drive yourself to the hospital. This information is not intended to replace advice given to you by your health care provider. Make sure you discuss any questions you have with your  health care provider. Document Revised: 11/20/2021 Document Reviewed: 11/20/2021 Elsevier Patient Education  2024 [employer].

## 2023-05-17 ENCOUNTER — Encounter (HOSPITAL_COMMUNITY)
Admission: RE | Admit: 2023-05-17 | Discharge: 2023-05-17 | Disposition: A | Discharge status: Home / Self Care | Payer: PPO | Source: Ambulatory Visit | Attending: Internal Medicine | Admitting: Internal Medicine

## 2023-05-17 ENCOUNTER — Encounter (HOSPITAL_COMMUNITY): Payer: Self-pay

## 2023-05-17 VITALS — BP 136/67 | HR 99 | Temp 97.5°F | Resp 18 | Ht 67.0 in | Wt 171.8 lb

## 2023-05-17 DIAGNOSIS — I10 Essential (primary) hypertension: Secondary | ICD-10-CM

## 2023-05-17 DIAGNOSIS — E119 Type 2 diabetes mellitus without complications: Secondary | ICD-10-CM

## 2023-05-17 DIAGNOSIS — Z01810 Encounter for preprocedural cardiovascular examination: Secondary | ICD-10-CM | POA: Insufficient documentation

## 2023-05-17 HISTORY — DX: Dyspnea, unspecified: R06.00

## 2023-05-17 LAB — BASIC METABOLIC PANEL
BUN/Creatinine Ratio: 16 (ref 10–24)
BUN: 12 mg/dL (ref 8–27)
CO2: 26 mmol/L (ref 20–29)
Calcium: 9.3 mg/dL (ref 8.6–10.2)
Chloride: 102 mmol/L (ref 96–106)
Creatinine, Ser: 0.77 mg/dL (ref 0.76–1.27)
Glucose: 119 mg/dL — ABNORMAL HIGH (ref 70–99)
Potassium: 4.3 mmol/L (ref 3.5–5.2)
Sodium: 142 mmol/L (ref 134–144)
eGFR: 98 mL/min/{1.73_m2} (ref >59)

## 2023-05-17 LAB — CBC WITH DIFFERENTIAL/PLATELET
Basophils Absolute: 0 10*3/uL (ref 0.0–0.2)
Basos: 1 %
EOS (ABSOLUTE): 0.5 10*3/uL — ABNORMAL HIGH (ref 0.0–0.4)
Eos: 9 %
Hematocrit: 43.3 % (ref 37.5–51.0)
Hemoglobin: 13.6 g/dL (ref 13.0–17.7)
Immature Grans (Abs): 0 10*3/uL (ref 0.0–0.1)
Immature Granulocytes: 0 %
Lymphocytes Absolute: 1.2 10*3/uL (ref 0.7–3.1)
Lymphs: 23 %
MCH: 27 pg (ref 26.6–33.0)
MCHC: 31.4 g/dL — ABNORMAL LOW (ref 31.5–35.7)
MCV: 86 fL (ref 79–97)
Monocytes Absolute: 0.4 10*3/uL (ref 0.1–0.9)
Monocytes: 7 %
Neutrophils Absolute: 2.9 10*3/uL (ref 1.4–7.0)
Neutrophils: 60 %
Platelets: 104 10*3/uL — ABNORMAL LOW (ref 150–450)
RBC: 5.03 x10E6/uL (ref 4.14–5.80)
RDW: 14.2 % (ref 11.6–15.4)
WBC: 5 10*3/uL (ref 3.4–10.8)

## 2023-05-17 LAB — HEPATIC FUNCTION PANEL
ALT: 27 [IU]/L (ref 0–44)
AST: 23 [IU]/L (ref 0–40)
Albumin: 4.5 g/dL (ref 3.9–4.9)
Alkaline Phosphatase: 129 [IU]/L — ABNORMAL HIGH (ref 44–121)
Bilirubin Total: 0.7 mg/dL (ref 0.0–1.2)
Bilirubin, Direct: 0.25 mg/dL (ref 0.00–0.40)
Total Protein: 8 g/dL (ref 6.0–8.5)

## 2023-05-17 LAB — AFP TUMOR MARKER: AFP, Serum, Tumor Marker: <1.8 ng/mL (ref 0.0–8.4)

## 2023-05-17 LAB — PROTIME-INR
INR: 1 (ref 0.9–1.2)
Prothrombin Time: 11.6 s (ref 9.1–12.0)

## 2023-05-17 LAB — SPECIMEN STATUS REPORT

## 2023-05-21 ENCOUNTER — Other Ambulatory Visit: Payer: Self-pay | Admitting: *Deleted

## 2023-05-21 DIAGNOSIS — K7581 Nonalcoholic steatohepatitis (NASH): Secondary | ICD-10-CM

## 2023-05-22 ENCOUNTER — Ambulatory Visit (HOSPITAL_COMMUNITY): Payer: PPO | Admitting: Anesthesiology

## 2023-05-22 ENCOUNTER — Encounter (HOSPITAL_COMMUNITY)
Admission: RE | Disposition: A | Discharge status: Home / Self Care | Payer: Self-pay | Source: Home / Self Care | Attending: Internal Medicine

## 2023-05-22 ENCOUNTER — Ambulatory Visit (HOSPITAL_COMMUNITY)
Admission: RE | Admit: 2023-05-22 | Discharge: 2023-05-22 | Disposition: A | Discharge status: Home / Self Care | Payer: PPO | Attending: Internal Medicine | Admitting: Internal Medicine

## 2023-05-22 ENCOUNTER — Encounter (HOSPITAL_COMMUNITY): Payer: Self-pay | Admitting: Internal Medicine

## 2023-05-22 DIAGNOSIS — K219 Gastro-esophageal reflux disease without esophagitis: Secondary | ICD-10-CM | POA: Insufficient documentation

## 2023-05-22 DIAGNOSIS — K7581 Nonalcoholic steatohepatitis (NASH): Secondary | ICD-10-CM | POA: Insufficient documentation

## 2023-05-22 DIAGNOSIS — K3189 Other diseases of stomach and duodenum: Secondary | ICD-10-CM

## 2023-05-22 DIAGNOSIS — J449 Chronic obstructive pulmonary disease, unspecified: Secondary | ICD-10-CM | POA: Diagnosis not present

## 2023-05-22 DIAGNOSIS — E119 Type 2 diabetes mellitus without complications: Secondary | ICD-10-CM | POA: Diagnosis not present

## 2023-05-22 DIAGNOSIS — K7460 Unspecified cirrhosis of liver: Secondary | ICD-10-CM | POA: Insufficient documentation

## 2023-05-22 DIAGNOSIS — Z7984 Long term (current) use of oral hypoglycemic drugs: Secondary | ICD-10-CM | POA: Insufficient documentation

## 2023-05-22 DIAGNOSIS — K766 Portal hypertension: Secondary | ICD-10-CM | POA: Diagnosis not present

## 2023-05-22 DIAGNOSIS — J9610 Chronic respiratory failure, unspecified whether with hypoxia or hypercapnia: Secondary | ICD-10-CM | POA: Insufficient documentation

## 2023-05-22 DIAGNOSIS — Z13810 Encounter for screening for upper gastrointestinal disorder: Secondary | ICD-10-CM | POA: Insufficient documentation

## 2023-05-22 DIAGNOSIS — Z87891 Personal history of nicotine dependence: Secondary | ICD-10-CM | POA: Insufficient documentation

## 2023-05-22 DIAGNOSIS — F32A Depression, unspecified: Secondary | ICD-10-CM | POA: Insufficient documentation

## 2023-05-22 DIAGNOSIS — Z9981 Dependence on supplemental oxygen: Secondary | ICD-10-CM | POA: Diagnosis not present

## 2023-05-22 DIAGNOSIS — K7400 Hepatic fibrosis, unspecified: Secondary | ICD-10-CM | POA: Diagnosis not present

## 2023-05-22 DIAGNOSIS — Z79899 Other long term (current) drug therapy: Secondary | ICD-10-CM | POA: Diagnosis not present

## 2023-05-22 DIAGNOSIS — J4489 Other specified chronic obstructive pulmonary disease: Secondary | ICD-10-CM | POA: Insufficient documentation

## 2023-05-22 DIAGNOSIS — M7989 Other specified soft tissue disorders: Secondary | ICD-10-CM | POA: Insufficient documentation

## 2023-05-22 DIAGNOSIS — F419 Anxiety disorder, unspecified: Secondary | ICD-10-CM | POA: Diagnosis not present

## 2023-05-22 DIAGNOSIS — I10 Essential (primary) hypertension: Secondary | ICD-10-CM | POA: Insufficient documentation

## 2023-05-22 HISTORY — PX: ESOPHAGOGASTRODUODENOSCOPY (EGD) WITH PROPOFOL: SHX5813

## 2023-05-22 LAB — GLUCOSE, CAPILLARY: Glucose-Capillary: 144 mg/dL — ABNORMAL HIGH (ref 70–99)

## 2023-05-22 SURGERY — ESOPHAGOGASTRODUODENOSCOPY (EGD) WITH PROPOFOL
Anesthesia: General

## 2023-05-22 MED ORDER — PROPOFOL 10 MG/ML IV BOLUS
INTRAVENOUS | Status: DC | PRN
  Administered 2023-05-22: 60 mg via INTRAVENOUS

## 2023-05-22 MED ORDER — LIDOCAINE HCL (CARDIAC) PF 100 MG/5ML IV SOSY
PREFILLED_SYRINGE | INTRAVENOUS | Status: DC | PRN
  Administered 2023-05-22: 50 mg via INTRAVENOUS

## 2023-05-22 MED ORDER — PROPOFOL 500 MG/50ML IV EMUL
INTRAVENOUS | Status: DC | PRN
  Administered 2023-05-22: 150 ug/kg/min via INTRAVENOUS

## 2023-05-22 MED ORDER — LIDOCAINE HCL (PF) 2 % IJ SOLN
INTRAMUSCULAR | Status: AC
  Filled 2023-05-22: qty 5

## 2023-05-22 MED ORDER — PROPOFOL 500 MG/50ML IV EMUL
INTRAVENOUS | Status: AC
  Filled 2023-05-22: qty 50

## 2023-05-22 MED ORDER — LACTATED RINGERS IV SOLN: INTRAVENOUS | Status: DC | PRN

## 2023-05-22 NOTE — Transfer of Care (Signed)
Immediate Anesthesia Transfer of Care Note  Patient: HALVOR RILLEY  Procedure(s) Performed: ESOPHAGOGASTRODUODENOSCOPY (EGD) WITH PROPOFOL  Patient Location: Short Stay  Anesthesia Type:General  Level of Consciousness: awake, alert , and oriented  Airway & Oxygen Therapy: Patient Spontanous Breathing and Patient connected to nasal cannula oxygen  Post-op Assessment: Report given to RN, Post -op Vital signs reviewed and stable, Patient moving all extremities X 4, and Patient able to stick tongue midline  Post vital signs: Reviewed and stable  Last Vitals:  Vitals Value Taken Time  BP 81/42 05/22/23 0937  Temp 36.6 C 05/22/23 0937  Pulse 67 05/22/23 0937  Resp 16 05/22/23 0937  SpO2 97 % 05/22/23 0937    Last Pain:  Vitals:   05/22/23 0803  TempSrc: Oral  PainSc: 0-No pain      Patients Stated Pain Goal: 5 (05/22/23 0803)  Complications: No notable events documented.

## 2023-05-22 NOTE — Discharge Instructions (Addendum)
EGD Discharge instructions Please read the instructions outlined below and refer to this sheet in the next few weeks. These discharge instructions provide you with general information on caring for yourself after you leave the hospital. Your doctor may also give you specific instructions. While your treatment has been planned according to the most current medical practices available, unavoidable complications occasionally occur. If you have any problems or questions after discharge, please call your doctor. ACTIVITY You may resume your regular activity but move at a slower pace for the next 24 hours.  Take frequent rest periods for the next 24 hours.  Walking will help expel (get rid of) the air and reduce the bloated feeling in your abdomen.  No driving for 24 hours (because of the anesthesia (medicine) used during the test).  You may shower.  Do not sign any important legal documents or operate any machinery for 24 hours (because of the anesthesia used during the test).  NUTRITION Drink plenty of fluids.  You may resume your normal diet.  Begin with a light meal and progress to your normal diet.  Avoid alcoholic beverages for 24 hours or as instructed by your caregiver.  MEDICATIONS You may resume your normal medications unless your caregiver tells you otherwise.  WHAT YOU CAN EXPECT TODAY You may experience abdominal discomfort such as a feeling of fullness or "gas" pains.  FOLLOW-UP Your doctor will discuss the results of your test with you.  SEEK IMMEDIATE MEDICAL ATTENTION IF ANY OF THE FOLLOWING OCCUR: Excessive nausea (feeling sick to your stomach) and/or vomiting.  Severe abdominal pain and distention (swelling).  Trouble swallowing.  Temperature over 101 F (37.8 C).  Rectal bleeding or vomiting of blood.       No esophageal varices found today.  Recommend repeat endoscopy in 2 years  Keep office visit with [place] in 3 months   at patient request, I called Dois Davenport at  [phone] -

## 2023-05-22 NOTE — Op Note (Signed)
[facility] Patient Name: [name] Procedure Date: 05/22/2023 8:39 AM MRN: [id number] Date of Birth: June 10, 1956 Attending MD: Gennette Pac , MD, [id number] CSN: [id number] Age: 67 Admit Type: Outpatient Procedure:                Upper GI endoscopy Indications:              Screening procedure, Cirrhosis rule out esophageal                            varices Providers:                Gennette Pac, MD, Nena Polio, RN, Zena Amos Referring MD:              Medicines:                Propofol per Anesthesia Complications:            No immediate complications. Estimated Blood Loss:     Estimated blood loss: none. Procedure:                Pre-Anesthesia Assessment:                           - Prior to the procedure, a History and Physical                            was performed, and patient medications and                            allergies were reviewed. The patient's tolerance of                            previous anesthesia was also reviewed. The risks                            and benefits of the procedure and the sedation                            options and risks were discussed with the patient.                            All questions were answered, and informed consent                            was obtained. Prior Anticoagulants: The patient has                            taken no anticoagulant or antiplatelet agents. ASA                            Grade Assessment: III - A patient with severe  systemic disease. After reviewing the risks and                            benefits, the patient was deemed in satisfactory                            condition to undergo the procedure.                           After obtaining informed consent, the endoscope was                            passed under direct vision. Throughout the                            procedure, the patient's blood pressure,  pulse, and                            oxygen saturations were monitored continuously. The                            [id number]-[id number] ([id number]) scope was introduced through the                            mouth, and advanced to the second part of duodenum.                            The upper GI endoscopy was accomplished without                            difficulty. The patient tolerated the procedure                            well. Scope In: 9:25:31 AM Scope Out: 9:28:58 AM Total Procedure Duration: 0 hours 3 minutes 27 seconds  Findings:      The examined esophagus was normal.      Mild portal hypertensive gastropathy was found in the entire examined       stomach.      The duodenal bulb and second portion of the duodenum were normal. Impression:               - Normal esophagus.                           - Portal hypertensive gastropathy.                           - Normal duodenal bulb and second portion of the                            duodenum.                           - No specimens collected. Moderate Sedation:      Moderate (conscious) sedation was personally administered by an  anesthesia professional. The following parameters were monitored: oxygen       saturation, heart rate, blood pressure, respiratory rate, EKG, adequacy       of pulmonary ventilation, and response to care. Recommendation:           - Patient has a contact number available for                            emergencies. The signs and symptoms of potential                            delayed complications were discussed with the                            patient. Return to normal activities tomorrow.                            Written discharge instructions were provided to the                            patient.                           - Resume previous diet.                           - Continue present medications.                           - Repeat upper endoscopy in 2 years for                             surveillance.                           - Return to GI office in 3 months. Procedure Code(s):        --- Professional ---                           [phone], Esophagogastroduodenoscopy, flexible,                            transoral; diagnostic, including collection of                            specimen(s) by brushing or washing, when performed                            (separate procedure) Diagnosis Code(s):        --- Professional ---                           K76.6, Portal hypertension                           K31.89, Other diseases of stomach and duodenum  Z13.810, Encounter for screening for upper                            gastrointestinal disorder                           K74.60, Unspecified cirrhosis of liver CPT copyright 2022 [employer]. All rights reserved. The codes documented in this report are preliminary and upon coder review may  be revised to meet current compliance requirements. Gerrit Friends. Despina Boan, MD Gennette Pac, MD 05/22/2023 9:42:58 AM This report has been signed electronically. Number of Addenda: 0

## 2023-05-22 NOTE — Anesthesia Postprocedure Evaluation (Signed)
Anesthesia Post Note  Patient: SHAWNTAY WHITTET  Procedure(s) Performed: ESOPHAGOGASTRODUODENOSCOPY (EGD) WITH PROPOFOL  Patient location during evaluation: PACU Anesthesia Type: General Level of consciousness: awake and alert Pain management: pain level controlled Vital Signs Assessment: post-procedure vital signs reviewed and stable Respiratory status: spontaneous breathing, nonlabored ventilation, respiratory function stable and patient connected to nasal cannula oxygen Cardiovascular status: blood pressure returned to baseline and stable Postop Assessment: no apparent nausea or vomiting Anesthetic complications: no   There were no known notable events for this encounter.   Last Vitals:  Vitals:   05/22/23 0937 05/22/23 0940  BP: (!) 81/42 102/61  Pulse: 67 70  Resp: 16   Temp: 36.6 C   SpO2: 97% 100%    Last Pain:  Vitals:   05/22/23 0940  TempSrc:   PainSc: 0-No pain                 Sabrina Keough L Alois Mincer

## 2023-05-22 NOTE — Anesthesia Preprocedure Evaluation (Addendum)
Anesthesia Evaluation  Patient identified by MRN, date of birth, ID band Patient awake    Reviewed: Allergy & Precautions, NPO status , Patient's Chart, lab work & pertinent test results  History of Anesthesia Complications (+) PONV and history of anesthetic complications  Airway Mallampati: II  TM Distance: >3 FB Neck ROM: Full    Dental  (+) Upper Dentures, Missing, Dental Advisory Given Many lower teeth missing. Denies loose:   Pulmonary shortness of breath and Long-Term Oxygen Therapy, asthma , pneumonia, COPD,  oxygen dependent, former smoker   Pulmonary exam normal breath sounds clear to auscultation       Cardiovascular hypertension, + DOE  Normal cardiovascular exam+ dysrhythmias  Rhythm:Regular Rate:Normal  Chest pain reported   Neuro/Psych  PSYCHIATRIC DISORDERS Anxiety Depression    negative neurological ROS     GI/Hepatic ,GERD  Medicated,,(+) Hepatitis -  Endo/Other  diabetes, Well Controlled, Type 2, Oral Hypoglycemic Agents    Renal/GU negative Renal ROS  negative genitourinary   Musculoskeletal  (+) Arthritis , Osteoarthritis,    Abdominal   Peds negative pediatric ROS (+)  Hematology negative hematology ROS (+)   Anesthesia Other Findings   Reproductive/Obstetrics negative OB ROS                             Anesthesia Physical Anesthesia Plan  ASA: 4  Anesthesia Plan: General   Post-op Pain Management: Minimal or no pain anticipated   Induction: Intravenous  PONV Risk Score and Plan:   Airway Management Planned: Nasal Cannula, Natural Airway and Simple Face Mask  Additional Equipment: None  Intra-op Plan:   Post-operative Plan:   Informed Consent: I have reviewed the patients History and Physical, chart, labs and discussed the procedure including the risks, benefits and alternatives for the proposed anesthesia with the patient or authorized representative  who has indicated his/her understanding and acceptance.     Dental advisory given  Plan Discussed with: CRNA and Surgeon  Anesthesia Plan Comments:         Anesthesia Quick Evaluation

## 2023-05-22 NOTE — Interval H&P Note (Signed)
History and Physical Interval Note:  05/22/2023 9:10 AM  [name]  has presented today for surgery, with the diagnosis of NASH,GERD.  The various methods of treatment have been discussed with the patient and family. After consideration of risks, benefits and other options for treatment, the patient has consented to  Procedure(s) with comments: ESOPHAGOGASTRODUODENOSCOPY (EGD) WITH PROPOFOL (N/A) - 9:30 am, asa 3 as a surgical intervention.  The patient's history has been reviewed, patient examined, no change in status, stable for surgery.  I have reviewed the patient's chart and labs.  Questions were answered to the patient's satisfaction.     [name]     No change.  Denies dysphagia.  Surveillance  EGD per plan.  The risks, benefits, limitations, alternatives and imponderables have been reviewed with the patient. Potential for esophageal dilation, biopsy, etc. have also been reviewed.  Questions have been answered. All parties agreeable.

## 2023-05-24 ENCOUNTER — Other Ambulatory Visit (HOSPITAL_COMMUNITY)
Admission: RE | Admit: 2023-05-24 | Discharge: 2023-05-24 | Disposition: A | Discharge status: Home / Self Care | Payer: PPO | Source: Ambulatory Visit | Attending: Gastroenterology | Admitting: Gastroenterology

## 2023-05-24 ENCOUNTER — Ambulatory Visit (HOSPITAL_COMMUNITY)
Admission: RE | Admit: 2023-05-24 | Discharge: 2023-05-24 | Disposition: A | Discharge status: Home / Self Care | Payer: PPO | Source: Ambulatory Visit | Attending: Gastroenterology | Admitting: Gastroenterology

## 2023-05-24 DIAGNOSIS — K7581 Nonalcoholic steatohepatitis (NASH): Secondary | ICD-10-CM | POA: Insufficient documentation

## 2023-05-24 DIAGNOSIS — K7400 Hepatic fibrosis, unspecified: Secondary | ICD-10-CM | POA: Insufficient documentation

## 2023-05-24 DIAGNOSIS — K7460 Unspecified cirrhosis of liver: Secondary | ICD-10-CM | POA: Diagnosis not present

## 2023-05-24 DIAGNOSIS — K7689 Other specified diseases of liver: Secondary | ICD-10-CM | POA: Diagnosis not present

## 2023-05-24 LAB — BASIC METABOLIC PANEL
Anion gap: 9 (ref 5–15)
BUN: 18 mg/dL (ref 8–23)
CO2: 30 mmol/L (ref 22–32)
Calcium: 9.4 mg/dL (ref 8.9–10.3)
Chloride: 100 mmol/L (ref 98–111)
Creatinine, Ser: 0.99 mg/dL (ref 0.61–1.24)
GFR, Estimated: >60 mL/min (ref >60)
Glucose, Bld: 139 mg/dL — ABNORMAL HIGH (ref 70–99)
Potassium: 5 mmol/L (ref 3.5–5.1)
Sodium: 139 mmol/L (ref 135–145)

## 2023-05-28 ENCOUNTER — Ambulatory Visit (INDEPENDENT_AMBULATORY_CARE_PROVIDER_SITE_OTHER): Payer: PPO

## 2023-05-28 ENCOUNTER — Encounter (HOSPITAL_COMMUNITY): Payer: Self-pay | Admitting: Internal Medicine

## 2023-06-08 ENCOUNTER — Ambulatory Visit
Admission: RE | Admit: 2023-06-08 | Discharge: 2023-06-08 | Disposition: A | Discharge status: Home / Self Care | Payer: PPO | Source: Ambulatory Visit | Attending: Internal Medicine | Admitting: Internal Medicine

## 2023-06-08 ENCOUNTER — Emergency Department (HOSPITAL_COMMUNITY): Payer: PPO

## 2023-06-08 ENCOUNTER — Encounter (HOSPITAL_COMMUNITY): Payer: Self-pay

## 2023-06-08 ENCOUNTER — Inpatient Hospital Stay (HOSPITAL_COMMUNITY)
Admission: EM | Admit: 2023-06-08 | Discharge: 2023-06-11 | DRG: 193 | Disposition: A | Discharge status: Home / Self Care | Payer: PPO | Attending: Family Medicine | Admitting: Family Medicine

## 2023-06-08 ENCOUNTER — Other Ambulatory Visit: Payer: Self-pay

## 2023-06-08 VITALS — BP 81/52 | HR 100 | Temp 97.3°F | Resp 26

## 2023-06-08 DIAGNOSIS — E785 Hyperlipidemia, unspecified: Secondary | ICD-10-CM | POA: Diagnosis not present

## 2023-06-08 DIAGNOSIS — K7581 Nonalcoholic steatohepatitis (NASH): Secondary | ICD-10-CM | POA: Diagnosis present

## 2023-06-08 DIAGNOSIS — Z9981 Dependence on supplemental oxygen: Secondary | ICD-10-CM | POA: Diagnosis not present

## 2023-06-08 DIAGNOSIS — J439 Emphysema, unspecified: Secondary | ICD-10-CM | POA: Diagnosis not present

## 2023-06-08 DIAGNOSIS — Z1152 Encounter for screening for COVID-19: Secondary | ICD-10-CM

## 2023-06-08 DIAGNOSIS — R000 Tachycardia, unspecified: Secondary | ICD-10-CM

## 2023-06-08 DIAGNOSIS — Z8249 Family history of ischemic heart disease and other diseases of the circulatory system: Secondary | ICD-10-CM

## 2023-06-08 DIAGNOSIS — I2510 Atherosclerotic heart disease of native coronary artery without angina pectoris: Secondary | ICD-10-CM | POA: Diagnosis not present

## 2023-06-08 DIAGNOSIS — D649 Anemia, unspecified: Secondary | ICD-10-CM | POA: Diagnosis not present

## 2023-06-08 DIAGNOSIS — I959 Hypotension, unspecified: Secondary | ICD-10-CM | POA: Diagnosis present

## 2023-06-08 DIAGNOSIS — Z8041 Family history of malignant neoplasm of ovary: Secondary | ICD-10-CM

## 2023-06-08 DIAGNOSIS — K219 Gastro-esophageal reflux disease without esophagitis: Secondary | ICD-10-CM | POA: Diagnosis present

## 2023-06-08 DIAGNOSIS — J9601 Acute respiratory failure with hypoxia: Secondary | ICD-10-CM | POA: Diagnosis not present

## 2023-06-08 DIAGNOSIS — E119 Type 2 diabetes mellitus without complications: Secondary | ICD-10-CM

## 2023-06-08 DIAGNOSIS — J441 Chronic obstructive pulmonary disease with (acute) exacerbation: Secondary | ICD-10-CM | POA: Diagnosis present

## 2023-06-08 DIAGNOSIS — Z833 Family history of diabetes mellitus: Secondary | ICD-10-CM

## 2023-06-08 DIAGNOSIS — Z860100 Personal history of colon polyps, unspecified: Secondary | ICD-10-CM

## 2023-06-08 DIAGNOSIS — I10 Essential (primary) hypertension: Secondary | ICD-10-CM | POA: Diagnosis not present

## 2023-06-08 DIAGNOSIS — E1165 Type 2 diabetes mellitus with hyperglycemia: Secondary | ICD-10-CM | POA: Diagnosis present

## 2023-06-08 DIAGNOSIS — R0602 Shortness of breath: Secondary | ICD-10-CM | POA: Diagnosis not present

## 2023-06-08 DIAGNOSIS — Z79899 Other long term (current) drug therapy: Secondary | ICD-10-CM | POA: Diagnosis not present

## 2023-06-08 DIAGNOSIS — J449 Chronic obstructive pulmonary disease, unspecified: Secondary | ICD-10-CM

## 2023-06-08 DIAGNOSIS — E878 Other disorders of electrolyte and fluid balance, not elsewhere classified: Secondary | ICD-10-CM | POA: Diagnosis present

## 2023-06-08 DIAGNOSIS — J440 Chronic obstructive pulmonary disease with acute lower respiratory infection: Secondary | ICD-10-CM | POA: Diagnosis present

## 2023-06-08 DIAGNOSIS — F419 Anxiety disorder, unspecified: Secondary | ICD-10-CM | POA: Diagnosis not present

## 2023-06-08 DIAGNOSIS — Z825 Family history of asthma and other chronic lower respiratory diseases: Secondary | ICD-10-CM | POA: Diagnosis not present

## 2023-06-08 DIAGNOSIS — J9621 Acute and chronic respiratory failure with hypoxia: Secondary | ICD-10-CM | POA: Diagnosis not present

## 2023-06-08 DIAGNOSIS — Z888 Allergy status to other drugs, medicaments and biological substances status: Secondary | ICD-10-CM | POA: Diagnosis not present

## 2023-06-08 DIAGNOSIS — J984 Other disorders of lung: Secondary | ICD-10-CM | POA: Diagnosis not present

## 2023-06-08 DIAGNOSIS — N179 Acute kidney failure, unspecified: Secondary | ICD-10-CM | POA: Diagnosis not present

## 2023-06-08 DIAGNOSIS — J168 Pneumonia due to other specified infectious organisms: Secondary | ICD-10-CM | POA: Diagnosis not present

## 2023-06-08 DIAGNOSIS — D696 Thrombocytopenia, unspecified: Secondary | ICD-10-CM | POA: Diagnosis not present

## 2023-06-08 DIAGNOSIS — Z87891 Personal history of nicotine dependence: Secondary | ICD-10-CM

## 2023-06-08 DIAGNOSIS — J189 Pneumonia, unspecified organism: Secondary | ICD-10-CM | POA: Diagnosis not present

## 2023-06-08 DIAGNOSIS — E871 Hypo-osmolality and hyponatremia: Secondary | ICD-10-CM | POA: Diagnosis present

## 2023-06-08 DIAGNOSIS — Z7984 Long term (current) use of oral hypoglycemic drugs: Secondary | ICD-10-CM

## 2023-06-08 DIAGNOSIS — R918 Other nonspecific abnormal finding of lung field: Secondary | ICD-10-CM | POA: Diagnosis not present

## 2023-06-08 DIAGNOSIS — D72829 Elevated white blood cell count, unspecified: Secondary | ICD-10-CM | POA: Diagnosis present

## 2023-06-08 LAB — BLOOD GAS, VENOUS
Acid-Base Excess: 5.8 mmol/L — ABNORMAL HIGH (ref 0.0–2.0)
Bicarbonate: 31.2 mmol/L — ABNORMAL HIGH (ref 20.0–28.0)
O2 Saturation: 79.2 %
Patient temperature: 37.1
pCO2, Ven: 47 mm[Hg] (ref 44–60)
pH, Ven: 7.43 (ref 7.25–7.43)
pO2, Ven: 44 mm[Hg] (ref 32–45)

## 2023-06-08 LAB — RESP PANEL BY RT-PCR (RSV, FLU A&B, COVID)  RVPGX2
Influenza A by PCR: NEGATIVE
Influenza B by PCR: NEGATIVE
Resp Syncytial Virus by PCR: NEGATIVE
SARS Coronavirus 2 by RT PCR: NEGATIVE

## 2023-06-08 LAB — CBC WITH DIFFERENTIAL/PLATELET
Abs Immature Granulocytes: 0.11 10*3/uL — ABNORMAL HIGH (ref 0.00–0.07)
Basophils Absolute: 0.1 10*3/uL (ref 0.0–0.1)
Basophils Relative: 0 %
Eosinophils Absolute: 0.1 10*3/uL (ref 0.0–0.5)
Eosinophils Relative: 0 %
HCT: 43 % (ref 39.0–52.0)
Hemoglobin: 14 g/dL (ref 13.0–17.0)
Immature Granulocytes: 1 %
Lymphocytes Relative: 7 %
Lymphs Abs: 1.1 10*3/uL (ref 0.7–4.0)
MCH: 27.5 pg (ref 26.0–34.0)
MCHC: 32.6 g/dL (ref 30.0–36.0)
MCV: 84.3 fL (ref 80.0–100.0)
Monocytes Absolute: 1.1 10*3/uL — ABNORMAL HIGH (ref 0.1–1.0)
Monocytes Relative: 7 %
Neutro Abs: 12.6 10*3/uL — ABNORMAL HIGH (ref 1.7–7.7)
Neutrophils Relative %: 85 %
Platelets: 126 10*3/uL — ABNORMAL LOW (ref 150–400)
RBC: 5.1 MIL/uL (ref 4.22–5.81)
RDW: 14 % (ref 11.5–15.5)
WBC: 15.1 10*3/uL — ABNORMAL HIGH (ref 4.0–10.5)
nRBC: 0 % (ref 0.0–0.2)

## 2023-06-08 LAB — COMPREHENSIVE METABOLIC PANEL
ALT: 26 U/L (ref 0–44)
AST: 17 U/L (ref 15–41)
Albumin: 3.6 g/dL (ref 3.5–5.0)
Alkaline Phosphatase: 87 U/L (ref 38–126)
Anion gap: 11 (ref 5–15)
BUN: 16 mg/dL (ref 8–23)
CO2: 27 mmol/L (ref 22–32)
Calcium: 9.2 mg/dL (ref 8.9–10.3)
Chloride: 94 mmol/L — ABNORMAL LOW (ref 98–111)
Creatinine, Ser: 1.27 mg/dL — ABNORMAL HIGH (ref 0.61–1.24)
GFR, Estimated: >60 mL/min (ref >60)
Glucose, Bld: 176 mg/dL — ABNORMAL HIGH (ref 70–99)
Potassium: 5.1 mmol/L (ref 3.5–5.1)
Sodium: 132 mmol/L — ABNORMAL LOW (ref 135–145)
Total Bilirubin: 1.7 mg/dL — ABNORMAL HIGH (ref <1.2)
Total Protein: 8.3 g/dL — ABNORMAL HIGH (ref 6.5–8.1)

## 2023-06-08 LAB — LACTIC ACID, PLASMA: Lactic Acid, Venous: 1.2 mmol/L (ref 0.5–1.9)

## 2023-06-08 LAB — GLUCOSE, CAPILLARY: Glucose-Capillary: 237 mg/dL — ABNORMAL HIGH (ref 70–99)

## 2023-06-08 LAB — BRAIN NATRIURETIC PEPTIDE: B Natriuretic Peptide: 17 pg/mL (ref 0.0–100.0)

## 2023-06-08 LAB — PROTIME-INR
INR: 1 (ref 0.8–1.2)
Prothrombin Time: 13.8 s (ref 11.4–15.2)

## 2023-06-08 LAB — TROPONIN I (HIGH SENSITIVITY)
Troponin I (High Sensitivity): 3 ng/L (ref <18)
Troponin I (High Sensitivity): 3 ng/L (ref <18)

## 2023-06-08 LAB — MRSA NEXT GEN BY PCR, NASAL: MRSA by PCR Next Gen: DETECTED — AB

## 2023-06-08 LAB — APTT: aPTT: 33 s (ref 24–36)

## 2023-06-08 MED ORDER — ALBUTEROL SULFATE (2.5 MG/3ML) 0.083% IN NEBU
2.5000 mg | INHALATION_SOLUTION | Freq: Once | RESPIRATORY_TRACT | Status: AC
  Administered 2023-06-08: 2.5 mg via RESPIRATORY_TRACT
  Filled 2023-06-08: qty 3

## 2023-06-08 MED ORDER — PANTOPRAZOLE SODIUM 40 MG PO TBEC
40.0000 mg | DELAYED_RELEASE_TABLET | Freq: Every morning | ORAL | Status: DC
  Administered 2023-06-09 – 2023-06-11 (×3): 40 mg via ORAL
  Filled 2023-06-08 (×3): qty 1

## 2023-06-08 MED ORDER — INSULIN ASPART 100 UNIT/ML IJ SOLN
0.0000 [IU] | Freq: Every day | INTRAMUSCULAR | Status: DC
  Administered 2023-06-08: 2 [IU] via SUBCUTANEOUS
  Administered 2023-06-09 – 2023-06-10 (×2): 5 [IU] via SUBCUTANEOUS

## 2023-06-08 MED ORDER — SERTRALINE HCL 50 MG PO TABS
50.0000 mg | ORAL_TABLET | Freq: Every morning | ORAL | Status: DC
  Administered 2023-06-09 – 2023-06-11 (×3): 50 mg via ORAL
  Filled 2023-06-08 (×3): qty 1

## 2023-06-08 MED ORDER — SODIUM CHLORIDE 0.9 % IV SOLN: INTRAVENOUS | Status: AC

## 2023-06-08 MED ORDER — METHYLPREDNISOLONE SODIUM SUCC 125 MG IJ SOLR
125.0000 mg | Freq: Once | INTRAMUSCULAR | Status: AC
  Administered 2023-06-08: 125 mg via INTRAVENOUS
  Filled 2023-06-08: qty 2

## 2023-06-08 MED ORDER — ACETAMINOPHEN 325 MG PO TABS: 650.0000 mg | ORAL_TABLET | Freq: Four times a day (QID) | ORAL | Status: DC | PRN

## 2023-06-08 MED ORDER — SODIUM CHLORIDE 0.9 % IV SOLN: 2.0000 g | INTRAVENOUS | Status: DC

## 2023-06-08 MED ORDER — ALBUTEROL SULFATE (2.5 MG/3ML) 0.083% IN NEBU: 2.5000 mg | INHALATION_SOLUTION | RESPIRATORY_TRACT | Status: DC | PRN

## 2023-06-08 MED ORDER — LOSARTAN POTASSIUM 50 MG PO TABS
25.0000 mg | ORAL_TABLET | Freq: Every day | ORAL | Status: DC
  Administered 2023-06-09 – 2023-06-11 (×3): 25 mg via ORAL
  Filled 2023-06-08 (×3): qty 1

## 2023-06-08 MED ORDER — ONDANSETRON HCL 4 MG/2ML IJ SOLN: 4.0000 mg | Freq: Four times a day (QID) | INTRAMUSCULAR | Status: DC | PRN

## 2023-06-08 MED ORDER — SODIUM CHLORIDE 0.9 % IV SOLN
2.0000 g | INTRAVENOUS | Status: DC
  Administered 2023-06-09 – 2023-06-11 (×3): 2 g via INTRAVENOUS
  Filled 2023-06-08 (×3): qty 20

## 2023-06-08 MED ORDER — INSULIN ASPART 100 UNIT/ML IJ SOLN
0.0000 [IU] | Freq: Three times a day (TID) | INTRAMUSCULAR | Status: DC
  Administered 2023-06-09: 15 [IU] via SUBCUTANEOUS
  Administered 2023-06-09 (×2): 5 [IU] via SUBCUTANEOUS
  Administered 2023-06-10 (×3): 8 [IU] via SUBCUTANEOUS
  Administered 2023-06-11: 2 [IU] via SUBCUTANEOUS

## 2023-06-08 MED ORDER — VANCOMYCIN HCL 1250 MG/250ML IV SOLN: 1250.0000 mg | INTRAVENOUS | Status: DC

## 2023-06-08 MED ORDER — POLYETHYLENE GLYCOL 3350 17 G PO PACK: 17.0000 g | PACK | Freq: Every day | ORAL | Status: DC | PRN

## 2023-06-08 MED ORDER — VANCOMYCIN HCL 1750 MG/350ML IV SOLN
1750.0000 mg | Freq: Once | INTRAVENOUS | Status: AC
  Administered 2023-06-08: 1750 mg via INTRAVENOUS
  Filled 2023-06-08: qty 350

## 2023-06-08 MED ORDER — INSULIN GLARGINE-YFGN 100 UNIT/ML ~~LOC~~ SOLN
15.0000 [IU] | Freq: Every day | SUBCUTANEOUS | Status: DC
  Administered 2023-06-08: 15 [IU] via SUBCUTANEOUS
  Filled 2023-06-08 (×2): qty 0.15

## 2023-06-08 MED ORDER — ENOXAPARIN SODIUM 40 MG/0.4ML IJ SOSY
40.0000 mg | PREFILLED_SYRINGE | INTRAMUSCULAR | Status: DC
  Administered 2023-06-08 – 2023-06-10 (×3): 40 mg via SUBCUTANEOUS
  Filled 2023-06-08 (×3): qty 0.4

## 2023-06-08 MED ORDER — DILTIAZEM HCL ER COATED BEADS 120 MG PO CP24
240.0000 mg | ORAL_CAPSULE | Freq: Every morning | ORAL | Status: DC
  Administered 2023-06-09 – 2023-06-11 (×3): 240 mg via ORAL
  Filled 2023-06-08: qty 2
  Filled 2023-06-08 (×2): qty 1

## 2023-06-08 MED ORDER — SODIUM CHLORIDE 0.9 % IV SOLN
1.0000 g | Freq: Once | INTRAVENOUS | Status: AC
  Administered 2023-06-08: 1 g via INTRAVENOUS
  Filled 2023-06-08 (×2): qty 10

## 2023-06-08 MED ORDER — TAMSULOSIN HCL 0.4 MG PO CAPS
0.4000 mg | ORAL_CAPSULE | Freq: Every morning | ORAL | Status: DC
  Administered 2023-06-09 – 2023-06-11 (×3): 0.4 mg via ORAL
  Filled 2023-06-08 (×3): qty 1

## 2023-06-08 MED ORDER — SODIUM CHLORIDE 0.9 % IV BOLUS
1000.0000 mL | Freq: Once | INTRAVENOUS | Status: AC
Start: 2023-06-08 — End: 2023-06-08
  Administered 2023-06-08: 1000 mL via INTRAVENOUS

## 2023-06-08 MED ORDER — PREDNISONE 20 MG PO TABS
40.0000 mg | ORAL_TABLET | Freq: Every day | ORAL | Status: DC
  Administered 2023-06-10 – 2023-06-11 (×2): 40 mg via ORAL
  Filled 2023-06-08 (×2): qty 2

## 2023-06-08 MED ORDER — FLUTICASONE FUROATE-VILANTEROL 100-25 MCG/ACT IN AEPB
1.0000 | INHALATION_SPRAY | Freq: Every day | RESPIRATORY_TRACT | Status: DC
  Administered 2023-06-09 – 2023-06-11 (×3): 1 via RESPIRATORY_TRACT
  Filled 2023-06-08: qty 28

## 2023-06-08 MED ORDER — IOHEXOL 350 MG/ML SOLN
75.0000 mL | Freq: Once | INTRAVENOUS | Status: AC | PRN
  Administered 2023-06-08: 75 mL via INTRAVENOUS

## 2023-06-08 MED ORDER — ALPRAZOLAM 0.25 MG PO TABS
0.2500 mg | ORAL_TABLET | Freq: Two times a day (BID) | ORAL | Status: DC | PRN
  Administered 2023-06-08 – 2023-06-10 (×5): 0.25 mg via ORAL
  Filled 2023-06-08 (×5): qty 1

## 2023-06-08 MED ORDER — METHYLPREDNISOLONE SODIUM SUCC 125 MG IJ SOLR
60.0000 mg | Freq: Two times a day (BID) | INTRAMUSCULAR | Status: AC
  Administered 2023-06-09 (×2): 60 mg via INTRAVENOUS
  Filled 2023-06-08 (×2): qty 2

## 2023-06-08 MED ORDER — IPRATROPIUM-ALBUTEROL 0.5-2.5 (3) MG/3ML IN SOLN
3.0000 mL | Freq: Once | RESPIRATORY_TRACT | Status: AC
Start: 2023-06-08 — End: 2023-06-08
  Administered 2023-06-08: 3 mL via RESPIRATORY_TRACT
  Filled 2023-06-08: qty 3

## 2023-06-08 MED ORDER — ACETAMINOPHEN 650 MG RE SUPP: 650.0000 mg | Freq: Four times a day (QID) | RECTAL | Status: DC | PRN

## 2023-06-08 MED ORDER — FAMOTIDINE 20 MG PO TABS
20.0000 mg | ORAL_TABLET | Freq: Every day | ORAL | Status: DC
  Administered 2023-06-08 – 2023-06-10 (×3): 20 mg via ORAL
  Filled 2023-06-08 (×3): qty 1

## 2023-06-08 MED ORDER — SODIUM CHLORIDE 0.9 % IV SOLN: 500.0000 mg | INTRAVENOUS | Status: DC

## 2023-06-08 MED ORDER — GABAPENTIN 300 MG PO CAPS
300.0000 mg | ORAL_CAPSULE | Freq: Every day | ORAL | Status: DC
  Administered 2023-06-08 – 2023-06-10 (×3): 300 mg via ORAL
  Filled 2023-06-08 (×3): qty 1

## 2023-06-08 MED ORDER — SODIUM CHLORIDE 0.9 % IV BOLUS (SEPSIS)
1000.0000 mL | Freq: Once | INTRAVENOUS | Status: AC
Start: 2023-06-08 — End: 2023-06-08
  Administered 2023-06-08: 1000 mL via INTRAVENOUS

## 2023-06-08 MED ORDER — ATORVASTATIN CALCIUM 40 MG PO TABS
40.0000 mg | ORAL_TABLET | Freq: Every morning | ORAL | Status: DC
  Administered 2023-06-09 – 2023-06-11 (×3): 40 mg via ORAL
  Filled 2023-06-08 (×3): qty 1

## 2023-06-08 MED ORDER — SODIUM CHLORIDE 0.9 % IV SOLN
500.0000 mg | INTRAVENOUS | Status: DC
  Administered 2023-06-08 – 2023-06-10 (×3): 500 mg via INTRAVENOUS
  Filled 2023-06-08 (×3): qty 5

## 2023-06-08 MED ORDER — IPRATROPIUM-ALBUTEROL 0.5-2.5 (3) MG/3ML IN SOLN
3.0000 mL | Freq: Four times a day (QID) | RESPIRATORY_TRACT | Status: DC
  Administered 2023-06-08 – 2023-06-09 (×5): 3 mL via RESPIRATORY_TRACT
  Filled 2023-06-08 (×5): qty 3

## 2023-06-08 MED ORDER — ONDANSETRON HCL 4 MG PO TABS: 4.0000 mg | ORAL_TABLET | Freq: Four times a day (QID) | ORAL | Status: DC | PRN

## 2023-06-08 NOTE — Discharge Instructions (Signed)
Go directly to the emergency department for further evaluation.

## 2023-06-08 NOTE — ED Notes (Signed)
Patient is being discharged from the Urgent Care and sent to the Emergency Department via POV . Per provider, patient is in need of higher level of care due to hypoxia and hypotension. Patient is aware and verbalizes understanding of plan of care.  Vitals:   06/08/23 1425  BP: (!) 81/52  Pulse: 100  Resp: (!) 26  Temp: (!) 97.3 F (36.3 C)  SpO2: 91%

## 2023-06-08 NOTE — ED Provider Notes (Signed)
[place] [facility]ERGENCY DEPARTMENT AT [facility] Provider Note   CSN: [id number] Arrival date & time: 06/08/23  1438     History  Chief Complaint  Patient presents with   Shortness of Breath    [name] is a 67 y.o. male.  HPI   67 year old male presents emergency department with complaints of cough, shortness of breath.  Patient states he has been with symptoms for the past 2 weeks.  Reports history of COPD is on 3 L at baseline.  Was originally treated for COPD exacerbation 2 weeks ago and placed on prednisone and doxycycline.  States that he felt better after treatment of this.  States that 5 days ago, went back to primary care for worsening symptoms again and was placed on Levaquin.  States his symptoms have not improved this time while being on Levaquin.  Reports continued shortness of breath, cough described as productive.  Denies any fever, abdominal pain, nausea, vomiting.  States that a couple days ago, developed generalized bodyaches.  Past medical history significant for COPD, chronic respiratory failure with hypoxia on 3 L nasal cannula, nash, GERD, hypertension, hyperlipidemia, diabetes mellitus,  Home Medications Prior to Admission medications   Medication Sig Start Date End Date Taking? Authorizing Provider  acetaminophen (TYLENOL) 500 MG tablet Take 1,000 mg by mouth every 6 (six) hours as needed for headache or moderate pain.   Yes [provider]  albuterol (PROVENTIL) (2.5 MG/3ML) 0.083% nebulizer solution Take 3 mLs (2.5 mg total) by nebulization every 2 (two) hours as needed for wheezing or shortness of breath (cough). 06/13/20  Yes Emokpae, Courage, MD  albuterol (VENTOLIN HFA) 108 (90 Base) MCG/ACT inhaler INHALE 2 PUFFS INTO THE LUNGS EVERY 4 HOURS AS NEEDED FOR WHEEZING OR SHORTNESS OF BREATH. 11/18/22  Yes Nyoka Cowden, MD  ALPRAZolam Prudy Feeler) 0.25 MG tablet Take 0.25 mg by mouth 2 (two) times daily as needed for sleep or anxiety.   Yes  [provider]  atorvastatin (LIPITOR) 40 MG tablet Take 40 mg by mouth in the morning.   Yes [provider]  BREZTRI AEROSPHERE 160-9-4.8 MCG/ACT AERO INHALE 2 PUFFS INTO THE LUNGS TWICE A DAY 05/10/23  Yes Nyoka Cowden, MD  Cholecalciferol (VITAMIN D3) 25 MCG (1000 UT) CAPS Take 1,000 Units by mouth in the morning.   Yes [provider]  diltiazem (CARDIZEM CD) 240 MG 24 hr capsule Take 240 mg by mouth in the morning. 01/12/21  Yes [provider]  famotidine (PEPCID) 20 MG tablet One after supper 09/06/22  Yes Nyoka Cowden, MD  ferrous sulfate 325 (65 FE) MG tablet Take 325 mg by mouth in the morning.   Yes [provider]  fluticasone (FLONASE) 50 MCG/ACT nasal spray Place 1 spray into both nostrils daily as needed for allergies or rhinitis.   Yes [provider]  furosemide (LASIX) 20 MG tablet Take 1 tablet (20 mg total) by mouth daily. 05/16/23 05/15/24 Yes Ermalinda Memos S, PA-C  gabapentin (NEURONTIN) 300 MG capsule Take 1 capsule (300 mg total) by mouth at bedtime. 06/13/20  Yes Emokpae, Courage, MD  glimepiride (AMARYL) 1 MG tablet Take 1 mg by mouth daily with breakfast.   Yes [provider]  levofloxacin (LEVAQUIN) 500 MG tablet Take 500 mg by mouth daily. 06/03/23  Yes [provider]  losartan (COZAAR) 25 MG tablet Take 25 mg by mouth daily. 01/18/23  Yes [provider]  metFORMIN (GLUCOPHAGE) 1000 MG tablet Take  1,000 mg by mouth daily with supper. 06/20/21  Yes [provider]  Multiple Vitamin (MULTIVITAMIN WITH MINERALS) TABS tablet Take 1 tablet by mouth at bedtime. 06/13/20  Yes Emokpae, Courage, MD  OXYGEN Inhale 3 L into the lungs continuous.   Yes [provider]  pantoprazole (PROTONIX) 40 MG tablet Take 1 tablet (40 mg total) by mouth in the morning. 05/08/23  Yes Ermalinda Memos S, PA-C  pioglitazone (ACTOS) 15 MG tablet Take 15 mg by mouth daily. 07/01/22  Yes [provider]  pyridOXINE (B-6) 50 MG tablet Take 50 mg by mouth daily.   Yes [provider]  sertraline (ZOLOFT) 50 MG tablet Take 1 tablet (50 mg total) by mouth daily. Patient taking differently: Take 50 mg by mouth in the morning. 06/13/20  Yes Emokpae, Courage, MD  spironolactone (ALDACTONE) 25 MG tablet Take 1 tablet (25 mg total) by mouth daily. 05/16/23 05/15/24 Yes Letta Median, PA-C  tamsulosin (FLOMAX) 0.4 MG CAPS capsule Take 0.4 mg by mouth in the morning.   Yes [provider]  vitamin C (ASCORBIC ACID) 250 MG tablet Take 250 mg by mouth in the morning.   Yes [provider]  Wheat Dextrin (BENEFIBER PO) Take 1 Capful by mouth daily.   Yes [provider]      Allergies    Lioresal [baclofen] and Zocor [simvastatin]    Review of Systems   Review of Systems  All other systems reviewed and are negative.   Physical Exam Updated Vital Signs BP (!) 98/59   Pulse 92   Temp 98.2 F (36.8 C) (Oral)   Resp 17   Ht 5\' 7"  (1.702 m)   Wt 77.1 kg   SpO2 98%   BMI 26.63 kg/m  Physical Exam Vitals and nursing note reviewed.  Constitutional:      General: He is in acute distress.     Appearance: He is well-developed. He is ill-appearing.  HENT:     Head: Normocephalic and atraumatic.  Eyes:     Conjunctiva/sclera: Conjunctivae normal.  Cardiovascular:     Rate and Rhythm: Normal rate and regular rhythm.     Heart sounds: No murmur heard. Pulmonary:     Effort: Tachypnea and respiratory distress present.     Breath sounds: Wheezing and rales present.     Comments: Diffuse wheeze/Rales on exam. Abdominal:     Palpations: Abdomen is soft.     Tenderness: There is no abdominal tenderness.  Musculoskeletal:        General: No swelling.     Cervical back: Neck supple.     Right lower leg: No edema.     Left lower leg: No edema.  Skin:    General: Skin is warm and dry.     Capillary Refill: Capillary refill takes less than 2  seconds.  Neurological:     Mental Status: He is alert.  Psychiatric:        Mood and Affect: Mood normal.     ED Results / Procedures / Treatments   Labs (all labs ordered are listed, but only abnormal results are displayed) Labs Reviewed  COMPREHENSIVE METABOLIC PANEL - Abnormal; Notable for the following components:      Result Value   Sodium 132 (*)    Chloride 94 (*)    Glucose, Bld 176 (*)    Creatinine, Ser 1.27 (*)    Total Protein 8.3 (*)    Total Bilirubin 1.7 (*)  All other components within normal limits  CBC WITH DIFFERENTIAL/PLATELET - Abnormal; Notable for the following components:   WBC 15.1 (*)    Platelets 126 (*)    Neutro Abs 12.6 (*)    Monocytes Absolute 1.1 (*)    Abs Immature Granulocytes 0.11 (*)    All other components within normal limits  BLOOD GAS, VENOUS - Abnormal; Notable for the following components:   Bicarbonate 31.2 (*)    Acid-Base Excess 5.8 (*)    All other components within normal limits  RESP PANEL BY RT-PCR (RSV, FLU A&B, COVID)  RVPGX2  CULTURE, BLOOD (ROUTINE X 2)  CULTURE, BLOOD (ROUTINE X 2)  LACTIC ACID, PLASMA  PROTIME-INR  APTT  BRAIN NATRIURETIC PEPTIDE  URINALYSIS, W/ REFLEX TO CULTURE (INFECTION SUSPECTED)  TROPONIN I (HIGH SENSITIVITY)  TROPONIN I (HIGH SENSITIVITY)    EKG EKG Interpretation Date/Time:  Saturday June 08 2023 14:47:35 EST Ventricular Rate:  95 PR Interval:  112 QRS Duration:  106 QT Interval:  341 QTC Calculation: 427 R Axis:   84  Text Interpretation: Sinus rhythm Borderline short PR interval Borderline right axis deviation Poor data quality in current ECG precludes serial comparison Confirmed by Pricilla Loveless [phone]) on 06/08/2023 3:04:30 PM  Radiology CT Angio Chest PE W/Cm &/Or Wo Cm  Result Date: 06/08/2023 CLINICAL DATA:  Chest pain, cough, shortness of breath with hypoxia and hypotension EXAM: CT ANGIOGRAPHY CHEST WITH CONTRAST TECHNIQUE: Multidetector CT imaging of the  chest was performed using the standard protocol during bolus administration of intravenous contrast. Multiplanar CT image reconstructions and MIPs were obtained to evaluate the vascular anatomy. RADIATION DOSE REDUCTION: This exam was performed according to the departmental dose-optimization program which includes automated exposure control, adjustment of the mA and/or kV according to patient size and/or use of iterative reconstruction technique. CONTRAST:  75mL OMNIPAQUE IOHEXOL 350 MG/ML SOLN COMPARISON:  Same day chest radiograph, CT chest dated 04/01/2023, CT abdomen and pelvis dated 04/17/2020 FINDINGS: Cardiovascular: The study is high quality for the evaluation of pulmonary embolism. There are no filling defects in the central, lobar, segmental or subsegmental pulmonary artery branches to suggest acute pulmonary embolism. Great vessels are normal in course and caliber. Normal heart size. No significant pericardial fluid/thickening. Coronary artery calcifications and aortic atherosclerosis. Mediastinum/Nodes: Imaged thyroid gland without nodules meeting criteria for imaging follow-up by size. Normal esophagus. No pathologically enlarged axillary, supraclavicular, mediastinal, or hilar lymph nodes. Lungs/Pleura: The central airways are patent. Apical predominant paraseptal emphysema. Scattered calcified granulomata, as before. New irregular consolidation along the medial left apex. Adjacent scattered peribronchovascular ground-glass densities. Slightly decreased irregular consolidation along the lingula and anterior left lower lobe. Chronic-appearing scarring, architectural distortion, and bronchiectasis are otherwise unchanged. No pneumothorax. No pleural effusion. Upper abdomen: Subcentimeter hypoattenuating focus along the peripheral spleen (4:85), small to characterize, but unchanged from 2021, likely benign. Musculoskeletal: No acute or abnormal lytic or blastic osseous lesions. Old right rib fractures.  Multilevel degenerative changes of the thoracic spine. Unchanged superior endplate compression of T11. Unchanged anterior wedging of L1 dating back to 2021. Review of the MIP images confirms the above findings. IMPRESSION: 1. No evidence of pulmonary embolism. 2. New irregular consolidation along the medial left apex with adjacent scattered peribronchovascular ground-glass densities, likely infectious/inflammatory. 3. Slightly decreased irregular consolidation along the lingula and anterior left lower lobe superimposed on unchanged chronic-appearing scarring, architectural distortion, and bronchiectasis. 4. Aortic Atherosclerosis (ICD10-I70.0) and Emphysema (ICD10-J43.9). Coronary artery calcifications. Assessment for potential risk factor modification, dietary therapy or  pharmacologic therapy may be warranted, if clinically indicated. Electronically Signed   By: Agustin Cree M.D.   On: 06/08/2023 16:28   DG Chest Portable 1 View  Result Date: 06/08/2023 CLINICAL DATA:  Shortness of breath EXAM: PORTABLE CHEST 1 VIEW COMPARISON:  Chest x-ray 12/27/2022.  Chest CT 04/01/2023. FINDINGS: The cardiomediastinal silhouette is within normal limits. Calcified granulomas are again seen in the right upper lobe. Scattered parenchymal interstitial opacities in linear opacity in the right mid lung appear unchanged from prior. Emphysematous changes are again noted. There is no new focal lung infiltrate. Costophrenic angles are clear. There is no pneumothorax or acute fracture. IMPRESSION: 1. No acute cardiopulmonary process. 2. Emphysematous changes. 3. Scattered parenchymal interstitial opacities and linear opacity in the right mid lung appear unchanged from prior, favored as chronic. Electronically Signed   By: Darliss Cheney M.D.   On: 06/08/2023 15:24    Procedures .Critical Care  Performed by: Peter Garter, PA Authorized by: Peter Garter, PA   Critical care provider statement:    Critical care time  (minutes):  51   Critical care was necessary to treat or prevent imminent or life-threatening deterioration of the following conditions:  Respiratory failure and sepsis   Critical care was time spent personally by me on the following activities:  Development of treatment plan with patient or surrogate, discussions with consultants, evaluation of patient's response to treatment, examination of patient, ordering and review of laboratory studies, ordering and review of radiographic studies, ordering and performing treatments and interventions, pulse oximetry, re-evaluation of patient's condition and review of old charts   I assumed direction of critical care for this patient from another provider in my specialty: no     Care discussed with: admitting provider       Medications Ordered in ED Medications  0.9 %  sodium chloride infusion ( Intravenous New Bag/Given 06/08/23 1758)  ceFEPIme (MAXIPIME) 1 g in sodium chloride 0.9 % 100 mL IVPB (1 g Intravenous New Bag/Given 06/08/23 1800)  vancomycin (VANCOREADY) IVPB 1750 mg/350 mL (1,750 mg Intravenous New Bag/Given 06/08/23 1707)  vancomycin (VANCOREADY) IVPB 1250 mg/250 mL (has no administration in time range)  ipratropium-albuterol (DUONEB) 0.5-2.5 (3) MG/3ML nebulizer solution 3 mL (3 mLs Nebulization Given 06/08/23 1523)  albuterol (PROVENTIL) (2.5 MG/3ML) 0.083% nebulizer solution 2.5 mg (2.5 mg Nebulization Given 06/08/23 1523)  iohexol (OMNIPAQUE) 350 MG/ML injection 75 mL (75 mLs Intravenous Contrast Given 06/08/23 1558)  sodium chloride 0.9 % bolus 1,000 mL (0 mLs Intravenous Stopped 06/08/23 1758)  sodium chloride 0.9 % bolus 1,000 mL (0 mLs Intravenous Stopped 06/08/23 1758)  methylPREDNISolone sodium succinate (SOLU-MEDROL) 125 mg/2 mL injection 125 mg (125 mg Intravenous Given 06/08/23 1703)    ED Course/ Medical Decision Making/ A&P Clinical Course as of 06/08/23 1811  Sat Jun 08, 2023  1739 Consulted hospitalist Dr. Gust Rung who  agreed with admission and assume further treatment/care. [CR]    Clinical Course User Index [CR] Peter Garter, PA                                 Medical Decision Making Amount and/or Complexity of Data Reviewed Labs: ordered. Radiology: ordered.  Risk Prescription drug management.   This patient presents to the ED for concern of cough, shortness of breath, this involves an extensive number of treatment options, and is a complaint that carries with it a high risk of complications and  morbidity.  The differential diagnosis includes COPD, asthma, pneumonia, PE, ACS, CHF, pericarditis/myocarditis/tamponade, viral URI, other   Co morbidities that complicate the patient evaluation  See HPI   Additional history obtained:  Additional history obtained from EMR External records from outside source obtained and reviewed including hospital records   Lab Tests:  I Ordered, and personally interpreted labs.  The pertinent results include: Leukocytosis of 15.1.  No evidence of anemia.  Thrombocytopenia platelets 126.  Hyponatremia as well as hypochloremia 1 30-94 respectively.  Patient with evidence of AKI with creatinine 1.27 from baseline around 0.9.  No transaminitis.  Lactic acid within normal limits.  Troponin of 3.  BNP normal.  VBG with normal pH.  APTT and PT/INR within normal limits.   Imaging Studies ordered:  I ordered imaging studies including chest x-ray, CT angio chest PE I independently visualized and interpreted imaging which showed  Chest x-ray: No acute process.  Chronic changes as above. CT angio chest PE: No evidence of PE.  Consolidation medial left apex.  Decreased irregular consolidation along the lingula.  Atherosclerosis.  Emphysema. I agree with the radiologist interpretation  Cardiac Monitoring: / EKG:  The patient was maintained on a cardiac monitor.  I personally viewed and interpreted the cardiac monitored which showed an underlying rhythm of: Sinus  rhythm   Consultations Obtained:  See ED course  Problem List / ED Course / Critical interventions / Medication management  Acute on chronic respiratory failure with hypoxia, pneumonia, I ordered medication including Solu-Medrol, normal saline, albuterol, DuoNeb, vancomycin, cefepime  Reevaluation of the patient after these medicines showed that the patient improved I have reviewed the patients home medicines and have made adjustments as needed   Social Determinants of Health:  Former cigarette use.  Denies illicit drug use.   Test / Admission - Considered:  Acute on chronic respiratory failure with hypoxia, pneumonia, Vitals signs significant for initial O2 sat 89 to 91% on patient's 3 L while resting.  Initial tachycardia with a heart rate of 101.  Patient subsequent developed of hypotension with blood pressures dropping 89/67 with some improvement after IV fluid administered to around 109/65. Otherwise within normal range and stable throughout visit. Laboratory/imaging studies significant for: See above 67 year old male presents emergency department with complaints of cough, shortness of breath the past couple weeks.  Has trialed outpatient treatment of symptoms with prednisone and doxycycline and subsequently Levaquin without significant improvement prompting visit to the emergency department.  Patient with history of chronic respiratory failure with hypoxia on 3 L nasal cannula regularly.  Was found to be slightly hypoxic from baseline 89 to 91% on his at home 3 L.  On exam, patient with diffuse wheeze/Rales auscultated bilateral lung fields.  Labs concerning for leukocytosis of 15.  CT imaging concerning for pneumonia.  Given patient's acute on chronic respiratory failure, meeting SIRS criteria, evidence of pneumonia, admission deemed most appropriate. Sepsis protocol followed.  Treatment plan were discussed at length with patient and they knowledge understanding was agreeable to said  plan.  Appropriate consultations were made as described in the ED course.  Patient was stable upon admission to the hospital.         Final Clinical Impression(s) / ED Diagnoses Final diagnoses:  Acute on chronic respiratory failure with hypoxia ([name])  Pneumonia due to infectious organism, unspecified laterality, unspecified part of lung    Rx / DC Orders ED Discharge Orders     None  Peter Garter, [place] 06/08/23 1811    Pricilla Loveless, MD 06/09/23 [phone]

## 2023-06-08 NOTE — Progress Notes (Signed)
Pharmacy Antibiotic Note  [name] is a 66 y.o. male admitted on 06/08/2023 with pneumonia. Symptoms include a productive cough and shortness of breath. Patient has a history of COPD and is on 3 L at baseline. He was treated for a COPD exacerbation 2 weeks ago and was feeling better; however, 5 days ago he was put on Levaquin by his PCP for worsening symptoms. Pharmacy has been consulted for vancomycin dosing.  Lactic acid 1.2 WBC 15.1 Afebrile CT chest: New irregular consolidation along the medial left apex with adjacent scattered peribronchovascular ground-glass densities, likely infectious/inflammatory  Plan: Day 1 of antibiotics Give vancomycin 1750 mg IV x1 followed by 1250 mg IV Q24H. Goal AUC 400-550. Expected AUC: 467.2 Expected Css min: 11.1 SCr used: 1.27  Weight used: IBW, Vd used: 0.72 (BMI 26.63) Continue to monitor renal function and follow culture results   Height: 5\' 7"  (170.2 cm) Weight: 77.1 kg (170 lb) IBW/kg (Calculated) : 66.1  Temp (24hrs), Avg:97.8 F (36.6 C), Min:97.3 F (36.3 C), Max:98.2 F (36.8 C)  Recent Labs  Lab 06/08/23 1510  WBC 15.1*  CREATININE 1.27*  LATICACIDVEN 1.2    Estimated Creatinine Clearance: 52.8 mL/min (A) (by C-G formula based on SCr of 1.27 mg/dL (H)).    Allergies  Allergen Reactions   Lioresal [Baclofen] Other (See Comments)    Lethargy     Zocor [Simvastatin] Nausea Only    Antimicrobials this admission: 11/30 Vanc >>  11/30 Azitho x 1 11/30 Ceftriaxone x 1  Dose adjustments this admission: None  Microbiology results: 11/30 BCx: ordered   Thank you for allowing pharmacy to be a part of this patient's care.  Merryl Hacker, PharmD Clinical Pharmacist 06/08/2023 4:40 PM

## 2023-06-08 NOTE — ED Triage Notes (Signed)
Pt to er, pt states that he is here for nausea and vomiting.  States that he has abd pain and leg pain, states that he has generalized pain.  Pt states that he also has a cough and some sob.

## 2023-06-08 NOTE — Progress Notes (Signed)
1843 Patient transported on BiPAP to ICU#1 without incident.

## 2023-06-08 NOTE — ED Triage Notes (Signed)
Per wife, pt had some congestion, nausea, chest pain, and SOB.      PCP put pt on antibiotics but pt states it did not provide any relief.

## 2023-06-08 NOTE — H&P (Signed)
History and Physical    Patient: [name] LKG:[id number] DOB: [birth year] DOA: 06/08/2023 DOS: the patient was seen and examined on 06/08/2023 PCP: Carylon Perches, MD  Patient coming from: Home  Chief Complaint:  Chief Complaint  Patient presents with   Shortness of Breath   HPI: BERK BASOM is a 67 y.o. male with medical history significant of diabetes, hypertension, COPD with chronic oxygen use of 3 to 4 L, NASH.  Patient presents with 2 weeks of shortness of breath that have been increasing.  Was initially treated for COPD exacerbation with prednisone and doxycycline.  He felt somewhat better, then became worse again and was placed on Levaquin and not steroids.  He has continued to have increasing shortness of breath and productive cough.  No fevers, abdominal pain, nausea, vomiting.   On arrival here, he was noted to be severely hypoxic and was placed on BiPAP with good improvement of his oxygen.  Review of Systems: As mentioned in the history of present illness. All other systems reviewed and are negative. Past Medical History:  Diagnosis Date   Anxiety    Arthritis    Asthma    Chest pain    a. 2003 Cath: nl cors.   Concussion    COPD (chronic obstructive pulmonary disease) ([name])    chronically on 3-4L [place]   Depression    Diabetes mellitus    Dyslipidemia    Tachycardia   Dyspnea    Dysrhythmia    Fracture 06/2014   lower back "L-1"   GERD (gastroesophageal reflux disease)    Hypertension    NASH (nonalcoholic steatohepatitis) 2018   [name], undergoing routine cirrhosis care; [name]   Pneumonia 12/01/2021   PONV (postoperative nausea and vomiting)    Splenomegaly    Thrombocytopenia ([name])    Past Surgical History:  Procedure Laterality Date   BIOPSY  07/26/2016   Procedure: BIOPSY;  Surgeon: [name], MD;  [facility]: [facility];  Service: Endoscopy;;  gastric   CARDIAC CATHETERIZATION  10/22/2001    normal coronary arteriea (Dr. Aram Candela)   COLONOSCOPY  08/31/2011   [id number] rectal and colon polyps-treated. Single tubular adenoma. Next TCS 08/2016   COLONOSCOPY N/A 07/26/2016   Dr. [name]: diverticulosis in sigmoid and descending colon. 6 mm benign splenic flexure. surveillance 5 yeras   COLONOSCOPY WITH PROPOFOL N/A 08/07/2021   Surgeon: [name], DO;  Nonbleeding internal hemorrhoids, three 3-6 mm polyps in the transverse colon resected and retrieved, otherwise normal exam.  Pathology with 1 tubular adenoma, otherwise benign colonic mucosa.  Recommended 5-year repeat.   ESOPHAGOGASTRODUODENOSCOPY N/A 07/26/2016   Dr. [name]: normal esophagus, portal gastropathy, chronic gastritis, normal duodenum, screening EGD 2 years    ESOPHAGOGASTRODUODENOSCOPY (EGD) WITH ESOPHAGEAL DILATION N/A 05/15/2013   ZDG:[id number] esophagus-status post Elease Hashimoto dilation/Portal gastropathy. Antral erosions-status post biopsy. extrinsic compression along lesser curvature likely secondary to splenomegaly. gastric bx benign   ESOPHAGOGASTRODUODENOSCOPY (EGD) WITH PROPOFOL N/A 12/12/2020   Surgeon: [name], MD; ormal esophagus s/p dilation, portal hypertensive gastropathy.  Due for repeat in 2024.   ESOPHAGOGASTRODUODENOSCOPY (EGD) WITH PROPOFOL N/A 05/22/2023   Procedure: ESOPHAGOGASTRODUODENOSCOPY (EGD) WITH PROPOFOL;  Surgeon: [name], MD;  [facility];  Service: Endoscopy;  Laterality: N/A;  9:30 am, asa 3   ETHMOIDECTOMY Bilateral 03/02/2022   Procedure: ETHMOIDECTOMY;  Surgeon: [name], MD;  [facility] OR;  Service: ENT;  Laterality: Bilateral;   HERNIA  REPAIR  03/2009   MALONEY DILATION N/A 12/12/2020   Procedure: Elease Hashimoto DILATION;  Surgeon: [name], MD;  [facility]: [facility];  Service: Endoscopy;  Laterality: N/A;   MAXILLARY ANTROSTOMY Bilateral 03/02/2022   Procedure: MAXILLARY ANTROSTOMY;  Surgeon: [name], MD;  Location: [facility] OR;  Service: ENT;  Laterality:  Bilateral;   NASAL SEPTOPLASTY W/ TURBINOPLASTY Bilateral 03/02/2022   Procedure: NASAL SEPTOPLASTY WITH TURBINATE REDUCTION;  Surgeon: [name], MD;  Location: [facility] OR;  Service: ENT;  Laterality: Bilateral;   POLYPECTOMY  07/26/2016   Procedure: POLYPECTOMY;  Surgeon: [name], MD;  [facility];  Service: Endoscopy;;  colon   POLYPECTOMY  08/07/2021   Procedure: POLYPECTOMY;  Surgeon: [name], DO;  Location: [facility];  Service: Endoscopy;;   SINUS ENDO WITH FUSION N/A 03/02/2022   Procedure: SINUS ENDO WITH FUSION;  Surgeon: [name], MD;  Location: [facility] OR;  Service: ENT;  Laterality: N/A;   TRANSTHORACIC ECHOCARDIOGRAM  2011   EF 50-55%, stage 1 diastolic dysfunction, trace TR & pulm valve regurg    VASECTOMY  1989   Social History:  reports that he quit smoking about 12 years ago. His smoking use included cigarettes. He started smoking about 48 years ago. He has a 45 pack-year smoking history. He has never used smokeless tobacco. He reports that he does not drink alcohol and does not use drugs.  Allergies  Allergen Reactions   Lioresal [Baclofen] Other (See Comments)    Lethargy     Zocor [Simvastatin] Nausea Only    Family History  Problem Relation Age of Onset   Heart disease Mother    Cancer Mother        lymph nodes   Diabetes Mother    Arthritis Other    Lung disease Other    Asthma Other    Kidney disease Other    Ovarian cancer Sister    Diabetes Brother    Heart disease Brother    Hypertension Brother    Diabetes Sister    Diabetes Sister    Cirrhosis Sister 56       no etoh   Colon cancer Neg Hx     Prior to Admission medications   Medication Sig Start Date End Date Taking? Authorizing Provider  acetaminophen (TYLENOL) 500 MG tablet Take 1,000 mg by mouth every 6 (six) hours as needed for headache or moderate pain.   Yes [provider]  albuterol (PROVENTIL) (2.5 MG/3ML) 0.083% nebulizer solution Take 3 mLs (2.5 mg  total) by nebulization every 2 (two) hours as needed for wheezing or shortness of breath (cough). 06/13/20  Yes Emokpae, Courage, MD  albuterol (VENTOLIN HFA) 108 (90 Base) MCG/ACT inhaler INHALE 2 PUFFS INTO THE LUNGS EVERY 4 HOURS AS NEEDED FOR WHEEZING OR SHORTNESS OF BREATH. 11/18/22  Yes Nyoka Cowden, MD  ALPRAZolam Prudy Feeler) 0.25 MG tablet Take 0.25 mg by mouth 2 (two) times daily as needed for sleep or anxiety.   Yes [provider]  atorvastatin (LIPITOR) 40 MG tablet Take 40 mg by mouth in the morning.   Yes [provider]  BREZTRI AEROSPHERE 160-9-4.8 MCG/ACT AERO INHALE 2 PUFFS INTO THE LUNGS TWICE A DAY 05/10/23  Yes Nyoka Cowden, MD  Cholecalciferol (VITAMIN D3) 25 MCG (1000 UT) CAPS Take 1,000 Units by mouth in the morning.   Yes [provider]  diltiazem (CARDIZEM CD) 240 MG 24 hr capsule Take 240 mg by mouth in  the morning. 01/12/21  Yes [provider]  famotidine (PEPCID) 20 MG tablet One after supper 09/06/22  Yes Nyoka Cowden, MD  ferrous sulfate 325 (65 FE) MG tablet Take 325 mg by mouth in the morning.   Yes [provider]  fluticasone (FLONASE) 50 MCG/ACT nasal spray Place 1 spray into both nostrils daily as needed for allergies or rhinitis.   Yes [provider]  furosemide (LASIX) 20 MG tablet Take 1 tablet (20 mg total) by mouth daily. 05/16/23 05/15/24 Yes Ermalinda Memos S, PA-C  gabapentin (NEURONTIN) 300 MG capsule Take 1 capsule (300 mg total) by mouth at bedtime. 06/13/20  Yes Emokpae, Courage, MD  glimepiride (AMARYL) 1 MG tablet Take 1 mg by mouth daily with breakfast.   Yes [provider]  levofloxacin (LEVAQUIN) 500 MG tablet Take 500 mg by mouth daily. 06/03/23  Yes [provider]  losartan (COZAAR) 25 MG tablet Take 25 mg by mouth daily. 01/18/23  Yes [provider]  metFORMIN (GLUCOPHAGE) 1000 MG tablet Take 1,000 mg by mouth daily with supper. 06/20/21  Yes [provider]  Multiple Vitamin (MULTIVITAMIN WITH MINERALS) TABS tablet Take 1 tablet by mouth at bedtime. 06/13/20  Yes Emokpae, Courage, MD  OXYGEN Inhale 3 L into the lungs continuous.   Yes [provider]  pantoprazole (PROTONIX) 40 MG tablet Take 1 tablet (40 mg total) by mouth in the morning. 05/08/23  Yes Ermalinda Memos S, PA-C  pioglitazone (ACTOS) 15 MG tablet Take 15 mg by mouth daily. 07/01/22  Yes [provider]  pyridOXINE (B-6) 50 MG tablet Take 50 mg by mouth daily.   Yes [provider]  sertraline (ZOLOFT) 50 MG tablet Take 1 tablet (50 mg total) by mouth daily. Patient taking differently: Take 50 mg by mouth in the morning. 06/13/20  Yes Emokpae, Courage, MD  spironolactone (ALDACTONE) 25 MG tablet Take 1 tablet (25 mg total) by mouth daily. 05/16/23 05/15/24 Yes Letta Median, PA-C  tamsulosin (FLOMAX) 0.4 MG CAPS capsule Take 0.4 mg by mouth in the morning.   Yes [provider]  vitamin C (ASCORBIC ACID) 250 MG tablet Take 250 mg by mouth in the morning.   Yes [provider]  Wheat Dextrin (BENEFIBER PO) Take 1 Capful by mouth daily.   Yes [provider]    Physical Exam: Vitals:   06/08/23 1730 06/08/23 1745 06/08/23 1800 06/08/23 1825  BP: (!) 103/59 (!) 104/59 103/62 (!) 109/57  Pulse: 89 88 87 91  Resp: 17 (!) 26 19 (!) 21  Temp:    98.1 F (36.7 C)  TempSrc:    Oral  SpO2: 98% 100% 98% 98%  Weight:      Height:       General: Older male. Awake and alert and oriented x3. No acute cardiopulmonary distress.  HEENT: Normocephalic atraumatic.  Right and left ears normal in appearance.  Pupils equal, round, reactive to light. Extraocular muscles are intact. Sclerae anicteric and noninjected.  Moist mucosal membranes. No mucosal lesions.  Neck: Neck supple without lymphadenopathy. No carotid bruits. No masses palpated.  Cardiovascular: Regular rate with normal S1-S2 sounds. No murmurs, rubs, gallops auscultated. No JVD.   Respiratory: Rales in the left upper and lower lobe.  No accessory muscle use. Abdomen: Soft, nontender, nondistended. Active bowel sounds. No masses or hepatosplenomegaly  Skin: No rashes, lesions, or ulcerations.  Dry, warm to touch. 2+ dorsalis pedis and radial pulses. Musculoskeletal: No calf or  leg pain. All major joints not erythematous nontender.  No upper or lower joint deformation.  Good ROM.  No contractures  Psychiatric: Intact judgment and insight. Pleasant and cooperative. Neurologic: No focal neurological deficits. Strength is 5/5 and symmetric in upper and lower extremities.  Cranial nerves II through XII are grossly intact.  Data Reviewed: Results for orders placed or performed during the hospital encounter of 06/08/23 (from the past 24 hour(s))  Comprehensive metabolic panel     Status: Abnormal   Collection Time: 06/08/23  3:10 PM  Result Value Ref Range   Sodium 132 (L) 135 - 145 mmol/L   Potassium 5.1 3.5 - 5.1 mmol/L   Chloride 94 (L) 98 - 111 mmol/L   CO2 27 22 - 32 mmol/L   Glucose, Bld 176 (H) 70 - 99 mg/dL   BUN 16 8 - 23 mg/dL   Creatinine, Ser 1.61 (H) 0.61 - 1.24 mg/dL   Calcium 9.2 8.9 - 09.6 mg/dL   Total Protein 8.3 (H) 6.5 - 8.1 g/dL   Albumin 3.6 3.5 - 5.0 g/dL   AST 17 15 - 41 U/L   ALT 26 0 - 44 U/L   Alkaline Phosphatase 87 38 - 126 U/L   Total Bilirubin 1.7 (H) <1.2 mg/dL   GFR, Estimated >04 >54 mL/min   Anion gap 11 5 - 15  CBC with Differential     Status: Abnormal   Collection Time: 06/08/23  3:10 PM  Result Value Ref Range   WBC 15.1 (H) 4.0 - 10.5 K/uL   RBC 5.10 4.22 - 5.81 MIL/uL   Hemoglobin 14.0 13.0 - 17.0 g/dL   HCT 09.8 11.9 - 14.7 %   MCV 84.3 80.0 - 100.0 fL   MCH 27.5 26.0 - 34.0 pg   MCHC 32.6 30.0 - 36.0 g/dL   RDW 82.9 56.2 - 13.0 %   Platelets 126 (L) 150 - 400 K/uL   nRBC 0.0 0.0 - 0.2 %   Neutrophils Relative % 85 %   Neutro Abs 12.6 (H) 1.7 - 7.7 K/uL   Lymphocytes Relative 7 %   Lymphs Abs 1.1 0.7 - 4.0 K/uL    Monocytes Relative 7 %   Monocytes Absolute 1.1 (H) 0.1 - 1.0 K/uL   Eosinophils Relative 0 %   Eosinophils Absolute 0.1 0.0 - 0.5 K/uL   Basophils Relative 0 %   Basophils Absolute 0.1 0.0 - 0.1 K/uL   Immature Granulocytes 1 %   Abs Immature Granulocytes 0.11 (H) 0.00 - 0.07 K/uL  Lactic acid, plasma     Status: None   Collection Time: 06/08/23  3:10 PM  Result Value Ref Range   Lactic Acid, Venous 1.2 0.5 - 1.9 mmol/L  Resp panel by RT-PCR (RSV, Flu A&B, Covid) Anterior Nasal Swab     Status: None   Collection Time: 06/08/23  3:10 PM   Specimen: Anterior Nasal Swab  Result Value Ref Range   SARS Coronavirus 2 by RT PCR NEGATIVE NEGATIVE   Influenza A by PCR NEGATIVE NEGATIVE   Influenza B by PCR NEGATIVE NEGATIVE   Resp Syncytial Virus by PCR NEGATIVE NEGATIVE  Troponin I (High Sensitivity)     Status: None   Collection Time: 06/08/23  3:10 PM  Result Value Ref Range   Troponin I (High Sensitivity) 3 <18 ng/L  Protime-INR     Status: None   Collection Time: 06/08/23  3:10 PM  Result Value Ref Range   Prothrombin Time 13.8 11.4 -  15.2 seconds   INR 1.0 0.8 - 1.2  APTT     Status: None   Collection Time: 06/08/23  3:10 PM  Result Value Ref Range   aPTT 33 24 - 36 seconds  Blood Culture (routine x 2)     Status: None (Preliminary result)   Collection Time: 06/08/23  3:10 PM   Specimen: BLOOD RIGHT FOREARM  Result Value Ref Range   Specimen Description      BLOOD RIGHT FOREARM BOTTLES DRAWN AEROBIC AND ANAEROBIC   Special Requests      Blood Culture results may not be optimal due to an inadequate volume of blood received in culture bottles Performed at [facility], [address], [place], [place] [address]    Culture PENDING    Report Status PENDING   Brain natriuretic peptide     Status: None   Collection Time: 06/08/23  3:10 PM  Result Value Ref Range   B Natriuretic Peptide 17.0 0.0 - 100.0 pg/mL  Blood gas, venous (at [facility] and [facility])     Status: Abnormal   Collection  Time: 06/08/23  3:14 PM  Result Value Ref Range   pH, Ven 7.43 7.25 - 7.43   pCO2, Ven 47 44 - 60 mmHg   pO2, Ven 44 32 - 45 mmHg   Bicarbonate 31.2 (H) 20.0 - 28.0 mmol/L   Acid-Base Excess 5.8 (H) 0.0 - 2.0 mmol/L   O2 Saturation 79.2 %   Patient temperature 37.1    Collection site BLOOD RIGHT FOREARM    Drawn by DEVOLT T   Troponin I (High Sensitivity)     Status: None   Collection Time: 06/08/23  5:02 PM  Result Value Ref Range   Troponin I (High Sensitivity) 3 <18 ng/L  Blood Culture (routine x 2)     Status: None (Preliminary result)   Collection Time: 06/08/23  5:02 PM   Specimen: BLOOD LEFT FOREARM  Result Value Ref Range   Specimen Description      BLOOD LEFT FOREARM BOTTLES DRAWN AEROBIC AND ANAEROBIC   Special Requests      Blood Culture results may not be optimal due to an inadequate volume of blood received in culture bottles Performed at [facility], [address] [place], [place] [address]    Culture PENDING    Report Status PENDING     CT Angio Chest PE W/Cm &/Or Wo Cm  Result Date: 06/08/2023 CLINICAL DATA:  Chest pain, cough, shortness of breath with hypoxia and hypotension EXAM: CT ANGIOGRAPHY CHEST WITH CONTRAST TECHNIQUE: Multidetector CT imaging of the chest was performed using the standard protocol during bolus administration of intravenous contrast. Multiplanar CT image reconstructions and MIPs were obtained to evaluate the vascular anatomy. RADIATION DOSE REDUCTION: This exam was performed according to the departmental dose-optimization program which includes automated exposure control, adjustment of the mA and/or kV according to patient size and/or use of iterative reconstruction technique. CONTRAST:  75mL OMNIPAQUE IOHEXOL 350 MG/ML SOLN COMPARISON:  Same day chest radiograph, CT chest dated 04/01/2023, CT abdomen and pelvis dated 04/17/2020 FINDINGS: Cardiovascular: The study is high quality for the evaluation of pulmonary embolism. There are no filling  defects in the central, lobar, segmental or subsegmental pulmonary artery branches to suggest acute pulmonary embolism. Great vessels are normal in course and caliber. Normal heart size. No significant pericardial fluid/thickening. Coronary artery calcifications and aortic atherosclerosis. Mediastinum/Nodes: Imaged thyroid gland without nodules meeting criteria for imaging follow-up by size. Normal esophagus. No pathologically  enlarged axillary, supraclavicular, mediastinal, or hilar lymph nodes. Lungs/Pleura: The central airways are patent. Apical predominant paraseptal emphysema. Scattered calcified granulomata, as before. New irregular consolidation along the medial left apex. Adjacent scattered peribronchovascular ground-glass densities. Slightly decreased irregular consolidation along the lingula and anterior left lower lobe. Chronic-appearing scarring, architectural distortion, and bronchiectasis are otherwise unchanged. No pneumothorax. No pleural effusion. Upper abdomen: Subcentimeter hypoattenuating focus along the peripheral spleen (4:85), small to characterize, but unchanged from 2021, likely benign. Musculoskeletal: No acute or abnormal lytic or blastic osseous lesions. Old right rib fractures. Multilevel degenerative changes of the thoracic spine. Unchanged superior endplate compression of T11. Unchanged anterior wedging of L1 dating back to 2021. Review of the MIP images confirms the above findings. IMPRESSION: 1. No evidence of pulmonary embolism. 2. New irregular consolidation along the medial left apex with adjacent scattered peribronchovascular ground-glass densities, likely infectious/inflammatory. 3. Slightly decreased irregular consolidation along the lingula and anterior left lower lobe superimposed on unchanged chronic-appearing scarring, architectural distortion, and bronchiectasis. 4. Aortic Atherosclerosis (ICD10-I70.0) and Emphysema (ICD10-J43.9). Coronary artery calcifications. Assessment  for potential risk factor modification, dietary therapy or pharmacologic therapy may be warranted, if clinically indicated. Electronically Signed   By: Agustin Cree M.D.   On: 06/08/2023 16:28   DG Chest Portable 1 View  Result Date: 06/08/2023 CLINICAL DATA:  Shortness of breath EXAM: PORTABLE CHEST 1 VIEW COMPARISON:  Chest x-ray 12/27/2022.  Chest CT 04/01/2023. FINDINGS: The cardiomediastinal silhouette is within normal limits. Calcified granulomas are again seen in the right upper lobe. Scattered parenchymal interstitial opacities in linear opacity in the right mid lung appear unchanged from prior. Emphysematous changes are again noted. There is no new focal lung infiltrate. Costophrenic angles are clear. There is no pneumothorax or acute fracture. IMPRESSION: 1. No acute cardiopulmonary process. 2. Emphysematous changes. 3. Scattered parenchymal interstitial opacities and linear opacity in the right mid lung appear unchanged from prior, favored as chronic. Electronically Signed   By: Darliss Cheney M.D.   On: 06/08/2023 15:24     Assessment and Plan: No notes have been filed under this hospital service. Service: Hospitalist  Principal Problem:   Acute on chronic respiratory failure with hypoxia ([name]) Active Problems:   Gastroesophageal reflux disease   HTN (hypertension)   Type 2 diabetes mellitus without complication, without long-term current use of insulin ([name])   COPD with acute exacerbation ([name])   CAP (community acquired pneumonia)   AKI (acute kidney injury) ([name])  Acute on chronic respiratory failure with hypoxia Continue BiPAP at for now.  Will wean as able Community-acquired pneumonia Antibiotics: Rocephin and azithromycin Robitussin Blood cultures drawn in the emergency department Sputum cultures CBC tomorrow Strep and Legionella antigen by urine Influenza screen and COVID screen negative COPD with acute exacerbation Antibiotics: As above DuoNeb's every 6 scheduled with  albuterol every 2 when necessary Continue inhaled steroids and LA bronchodilator Solu-Medrol 60 mg IV every 12 hours Mucinex AKI Gentle IV fluids Type 2 diabetes Hold oral hypoglycemics Hypertension   Advance Care Planning:   Code Status: Full Code confirmed by patient  Consults: None  Family Communication: None  Severity of Illness: The appropriate patient status for this patient is INPATIENT. Inpatient status is judged to be reasonable and necessary in order to provide the required intensity of service to ensure the patient's safety. The patient's presenting symptoms, physical exam findings, and initial radiographic and laboratory data in the context of their chronic comorbidities is felt to place them at high risk for further  clinical deterioration. Furthermore, it is not anticipated that the patient will be medically stable for discharge from the hospital within 2 midnights of admission.   * I certify that at the point of admission it is my clinical judgment that the patient will require inpatient hospital care spanning beyond 2 midnights from the point of admission due to high intensity of service, high risk for further deterioration and high frequency of surveillance required.*  Author: Levie Heritage, DO 06/08/2023 6:42 PM  For on call review [email or web address].

## 2023-06-08 NOTE — ED Notes (Signed)
Offered EMS, pt/family declined. Called report to [facility] ED spoke with Bonita Quin, RN.

## 2023-06-08 NOTE — Progress Notes (Signed)
CODE SEPSIS - PHARMACY COMMUNICATION  **Broad Spectrum Antibiotics should be administered within 1 hour of Sepsis diagnosis**  Time Code Sepsis Called/Page Received: 1636  Antibiotics Ordered: Azithromycin, ceftriaxone, vancomycin  Time of 1st antibiotic administration: 1707  Additional action taken by pharmacy: None  If necessary, Name of Provider/Nurse Contacted: None    Merryl Hacker ,PharmD Clinical Pharmacist  06/08/2023  4:37 PM

## 2023-06-09 DIAGNOSIS — J9621 Acute and chronic respiratory failure with hypoxia: Secondary | ICD-10-CM | POA: Diagnosis not present

## 2023-06-09 LAB — URINALYSIS, W/ REFLEX TO CULTURE (INFECTION SUSPECTED)
Bacteria, UA: NONE SEEN
Bilirubin Urine: NEGATIVE
Glucose, UA: >=500 mg/dL — AB
Hgb urine dipstick: NEGATIVE
Ketones, ur: NEGATIVE mg/dL
Leukocytes,Ua: NEGATIVE
Nitrite: NEGATIVE
Protein, ur: NEGATIVE mg/dL
Specific Gravity, Urine: 1.013 (ref 1.005–1.030)
pH: 6 (ref 5.0–8.0)

## 2023-06-09 LAB — BASIC METABOLIC PANEL
Anion gap: 8 (ref 5–15)
BUN: 15 mg/dL (ref 8–23)
CO2: 23 mmol/L (ref 22–32)
Calcium: 7.9 mg/dL — ABNORMAL LOW (ref 8.9–10.3)
Chloride: 106 mmol/L (ref 98–111)
Creatinine, Ser: 0.72 mg/dL (ref 0.61–1.24)
GFR, Estimated: >60 mL/min (ref >60)
Glucose, Bld: 254 mg/dL — ABNORMAL HIGH (ref 70–99)
Potassium: 4.3 mmol/L (ref 3.5–5.1)
Sodium: 137 mmol/L (ref 135–145)

## 2023-06-09 LAB — CBC
HCT: 37.5 % — ABNORMAL LOW (ref 39.0–52.0)
Hemoglobin: 12 g/dL — ABNORMAL LOW (ref 13.0–17.0)
MCH: 27.5 pg (ref 26.0–34.0)
MCHC: 32 g/dL (ref 30.0–36.0)
MCV: 86 fL (ref 80.0–100.0)
Platelets: 85 10*3/uL — ABNORMAL LOW (ref 150–400)
RBC: 4.36 MIL/uL (ref 4.22–5.81)
RDW: 13.8 % (ref 11.5–15.5)
WBC: 5.8 10*3/uL (ref 4.0–10.5)
nRBC: 0 % (ref 0.0–0.2)

## 2023-06-09 LAB — STREP PNEUMONIAE URINARY ANTIGEN: Strep Pneumo Urinary Antigen: NEGATIVE

## 2023-06-09 LAB — GLUCOSE, CAPILLARY
Glucose-Capillary: 206 mg/dL — ABNORMAL HIGH (ref 70–99)
Glucose-Capillary: 360 mg/dL — ABNORMAL HIGH (ref 70–99)
Glucose-Capillary: 244 mg/dL — ABNORMAL HIGH (ref 70–99)
Glucose-Capillary: 420 mg/dL — ABNORMAL HIGH (ref 70–99)

## 2023-06-09 LAB — HIV ANTIBODY (ROUTINE TESTING W REFLEX): HIV Screen 4th Generation wRfx: NONREACTIVE

## 2023-06-09 LAB — HEMOGLOBIN A1C
Hgb A1c MFr Bld: 7.2 % — ABNORMAL HIGH (ref 4.8–5.6)
Mean Plasma Glucose: 159.94 mg/dL

## 2023-06-09 MED ORDER — VANCOMYCIN HCL IN DEXTROSE 1-5 GM/200ML-% IV SOLN
1000.0000 mg | Freq: Two times a day (BID) | INTRAVENOUS | Status: DC
  Administered 2023-06-09 – 2023-06-11 (×4): 1000 mg via INTRAVENOUS
  Filled 2023-06-09 (×4): qty 200

## 2023-06-09 MED ORDER — DM-GUAIFENESIN ER 30-600 MG PO TB12
1.0000 | ORAL_TABLET | Freq: Two times a day (BID) | ORAL | Status: DC
  Administered 2023-06-09 – 2023-06-11 (×4): 1 via ORAL
  Filled 2023-06-09 (×4): qty 1

## 2023-06-09 MED ORDER — MUPIROCIN 2 % EX OINT
1.0000 | TOPICAL_OINTMENT | Freq: Two times a day (BID) | CUTANEOUS | Status: DC
  Administered 2023-06-09 – 2023-06-10 (×4): 1 via NASAL
  Filled 2023-06-09: qty 22

## 2023-06-09 MED ORDER — IPRATROPIUM-ALBUTEROL 0.5-2.5 (3) MG/3ML IN SOLN
3.0000 mL | Freq: Three times a day (TID) | RESPIRATORY_TRACT | Status: DC
  Administered 2023-06-10 – 2023-06-11 (×4): 3 mL via RESPIRATORY_TRACT
  Filled 2023-06-09 (×4): qty 3

## 2023-06-09 MED ORDER — INSULIN GLARGINE-YFGN 100 UNIT/ML ~~LOC~~ SOLN
12.0000 [IU] | Freq: Two times a day (BID) | SUBCUTANEOUS | Status: DC
  Administered 2023-06-09 – 2023-06-10 (×3): 12 [IU] via SUBCUTANEOUS
  Filled 2023-06-09 (×6): qty 0.12

## 2023-06-09 MED ORDER — INSULIN GLARGINE-YFGN 100 UNIT/ML ~~LOC~~ SOLN
12.0000 [IU] | Freq: Two times a day (BID) | SUBCUTANEOUS | Status: DC
  Filled 2023-06-09 (×2): qty 0.12

## 2023-06-09 MED ORDER — CHLORHEXIDINE GLUCONATE CLOTH 2 % EX PADS
6.0000 | MEDICATED_PAD | Freq: Every day | CUTANEOUS | Status: DC
Start: 2023-06-09 — End: 2023-06-11
  Administered 2023-06-09 – 2023-06-10 (×2): 6 via TOPICAL

## 2023-06-09 NOTE — Progress Notes (Signed)
Removed patient from Bipap at this time and placed on 3lpm [place].

## 2023-06-09 NOTE — Progress Notes (Signed)
   06/09/23 2115  Provider Notification  Provider Name/Title Dr. Thomes Dinning  Date Provider Notified 06/09/23  Time Provider Notified 2115  Method of Notification Page  Notification Reason Critical Result  Test performed and critical result CBG 420  Date Critical Result Received 06/09/23  Time Critical Result Received 2105  Provider response Other (Comment) (Novolog 5 units sq given)

## 2023-06-09 NOTE — Progress Notes (Signed)
PROGRESS NOTE  [name], is a 67 y.o. male, DOB - 1956/06/02, NWG:[id number]  Admit date - 06/08/2023   Admitting Physician Christopher Heritage, DO  Outpatient Primary MD for the patient is Christopher Perches, MD  LOS - 1  Chief Complaint  Patient presents with   Shortness of Breath       Brief Narrative:  67 y.o. male with medical history significant of diabetes, hypertension, COPD with chronic oxygen use of 3 to 4 L, NASH admitted on 06/08/2023 with acute on chronic hypoxic respiratory failure secondary to pneumonia    -Assessment and Plan: 1)CAP--- cough, dyspnea and increased oxygen requirement persist -Continue Rocephin/azithromycin -Mucolytics and bronchodilators as ordered WBC 15.1 >> 5.8  2) acute on chronic hypoxic respiratory failure--at baseline patient uses 3 to 4 L of oxygen -Hypoxia worsened and required BiPAP on and off -Anticipate that O2 requirement will improve back to baseline with treatment of #1 above  3)DM2- .  A1c 7.2 reflecting uncontrolled DM with hyperglycemia PTA -Continue Semglee Use Novolog/Humalog Sliding scale insulin with Accu-Cheks/Fingersticks as ordered   4) acute anemia--Hgb is down to 12.0 from 14.1 on admission baseline usually around 13-14 -Suspect hemodilution related from IV fluids -No bleeding concerns  5)AKI----acute kidney injury  -  creatinine on admission= 1.27  ,  baseline creatinine = 0.7 to 0.9   ,  creatinine is now= back to baseline with hydration, - renally adjust medications, avoid nephrotoxic agents / dehydration  / hypotension  6)HTN--stable, continue Cardizem and losartan  Status is: Inpatient   Disposition: The patient is from: Home              Anticipated d/c is to: Home              Anticipated d/c date is: 2 days              Patient currently is not medically stable to d/c. Barriers: Not Clinically Stable-   Code Status :  -  Code Status: Full Code   Family Communication:    (patient is alert, awake and  coherent)  Discussed with wife and daughter at bedside  DVT Prophylaxis  :   - SCDs   enoxaparin (LOVENOX) injection 40 mg Start: 06/08/23 2200   Lab Results  Component Value Date   PLT 85 (L) 06/09/2023    Inpatient Medications  Scheduled Meds:  atorvastatin  40 mg Oral q AM   Chlorhexidine Gluconate Cloth  6 each Topical Daily   diltiazem  240 mg Oral q AM   enoxaparin (LOVENOX) injection  40 mg Subcutaneous Q24H   famotidine  20 mg Oral QHS   fluticasone furoate-vilanterol  1 puff Inhalation Daily   gabapentin  300 mg Oral QHS   insulin aspart  0-15 Units Subcutaneous TID WC   insulin aspart  0-5 Units Subcutaneous QHS   insulin glargine-yfgn  12 Units Subcutaneous BID   ipratropium-albuterol  3 mL Nebulization Q6H   losartan  25 mg Oral Daily   mupirocin ointment  1 Application Nasal BID   pantoprazole  40 mg Oral q AM   [START ON 06/10/2023] predniSONE  40 mg Oral Q breakfast   sertraline  50 mg Oral q AM   tamsulosin  0.4 mg Oral q AM   Continuous Infusions:  azithromycin 500 mg (06/08/23 2107)   cefTRIAXone (ROCEPHIN)  IV 2 g (06/09/23 0304)   vancomycin Stopped (06/09/23 1700)   PRN Meds:.acetaminophen **OR** acetaminophen, albuterol, ALPRAZolam, ondansetron **OR** ondansetron (  ZOFRAN) IV, polyethylene glycol   Anti-infectives (From admission, onward)    Start     Dose/Rate Route Frequency Ordered Stop   06/09/23 1700  vancomycin (VANCOREADY) IVPB 1250 mg/250 mL  Status:  Discontinued        1,250 mg 166.7 mL/hr over 90 Minutes Intravenous Every 24 hours 06/08/23 1648 06/09/23 1354   06/09/23 1500  vancomycin (VANCOCIN) IVPB 1000 mg/200 mL premix        1,000 mg 200 mL/hr over 60 Minutes Intravenous Every 12 hours 06/09/23 1353     06/09/23 0200  cefTRIAXone (ROCEPHIN) 2 g in sodium chloride 0.9 % 100 mL IVPB        2 g 200 mL/hr over 30 Minutes Intravenous Every 24 hours 06/08/23 1939 06/14/23 0159   06/08/23 2030  azithromycin (ZITHROMAX) 500 mg in sodium  chloride 0.9 % 250 mL IVPB        500 mg 250 mL/hr over 60 Minutes Intravenous Every 24 hours 06/08/23 1939 06/13/23 2029   06/08/23 1645  cefTRIAXone (ROCEPHIN) 2 g in sodium chloride 0.9 % 100 mL IVPB  Status:  Discontinued        2 g 200 mL/hr over 30 Minutes Intravenous Every 24 hours 06/08/23 1636 06/08/23 1639   06/08/23 1645  azithromycin (ZITHROMAX) 500 mg in sodium chloride 0.9 % 250 mL IVPB  Status:  Discontinued        500 mg 250 mL/hr over 60 Minutes Intravenous Every 24 hours 06/08/23 1636 06/08/23 1639   06/08/23 1645  ceFEPIme (MAXIPIME) 1 g in sodium chloride 0.9 % 100 mL IVPB        1 g 200 mL/hr over 30 Minutes Intravenous  Once 06/08/23 1639 06/09/23 0722   06/08/23 1645  vancomycin (VANCOREADY) IVPB 1750 mg/350 mL        1,750 mg 175 mL/hr over 120 Minutes Intravenous  Once 06/08/23 1641 06/09/23 1308      Subjective: [name] today has no fevers, no emesis,  No chest pain,   -Discussed with wife and daughter at bedside, questions answered -Dyspnea, cough and increased oxygen requirement persist   Objective: Vitals:   06/09/23 1400 06/09/23 1402 06/09/23 1500 06/09/23 1505  BP: (!) 113/56  118/63   Pulse: 92  98   Resp: 20     Temp:    97.8 F (36.6 C)  TempSrc:    Oral  SpO2: 99% 96% 97%   Weight:      Height:        Intake/Output Summary (Last 24 hours) at 06/09/2023 1812 Last data filed at 06/09/2023 1757 Gross per 24 hour  Intake 2874.11 ml  Output 2450 ml  Net 424.11 ml   Filed Weights   06/08/23 1450 06/08/23 1453  Weight: 77.1 kg 77.1 kg    Physical Exam  Gen:- Awake Alert, some conversational dyspnea HEENT:- [name].AT, No sclera icterus Nose- [name] 5L/min Neck-Supple Neck,No JVD,.  Lungs-diminished breath sounds, few scattered rhonchi on the left  CV- S1, S2 normal, regular  Abd-  +ve B.Sounds, Abd Soft, No tenderness,    Extremity/Skin:- No  edema, pedal pulses present  Psych-affect is appropriate, oriented x3 Neuro-no new focal  deficits, no tremors  Data Reviewed: I have personally reviewed following labs and imaging studies  CBC: Recent Labs  Lab 06/08/23 1510 06/09/23 0512  WBC 15.1* 5.8  NEUTROABS 12.6*  --   HGB 14.0 12.0*  HCT 43.0 37.5*  MCV 84.3 86.0  PLT 126* 85*  Basic Metabolic Panel: Recent Labs  Lab 06/08/23 1510 06/09/23 0512  NA 132* 137  K 5.1 4.3  CL 94* 106  CO2 27 23  GLUCOSE 176* 254*  BUN 16 15  CREATININE 1.27* 0.72  CALCIUM 9.2 7.9*   GFR: Estimated Creatinine Clearance: 83.8 mL/min (by C-G formula based on SCr of 0.72 mg/dL). Liver Function Tests: Recent Labs  Lab 06/08/23 1510  AST 17  ALT 26  ALKPHOS 87  BILITOT 1.7*  PROT 8.3*  ALBUMIN 3.6   HbA1C: Recent Labs    06/09/23 0512  HGBA1C 7.2*   Recent Results (from the past 240 hour(s))  Resp panel by RT-PCR (RSV, Flu A&B, Covid) Anterior Nasal Swab     Status: None   Collection Time: 06/08/23  3:10 PM   Specimen: Anterior Nasal Swab  Result Value Ref Range Status   SARS Coronavirus 2 by RT PCR NEGATIVE NEGATIVE Final    Comment: (NOTE) SARS-CoV-2 target nucleic acids are NOT DETECTED.  The SARS-CoV-2 RNA is generally detectable in upper respiratory specimens during the acute phase of infection. The lowest concentration of SARS-CoV-2 viral copies this assay can detect is 138 copies/mL. A negative result does not preclude SARS-Cov-2 infection and should not be used as the sole basis for treatment or other patient management decisions. A negative result may occur with  improper specimen collection/handling, submission of specimen other than nasopharyngeal swab, presence of viral mutation(s) within the areas targeted by this assay, and inadequate number of viral copies(<138 copies/mL). A negative result must be combined with clinical observations, patient history, and epidemiological information. The expected result is Negative.  Fact Sheet for Patients:   BloggerCourse.com  Fact Sheet for Healthcare Providers:  SeriousBroker.it  This test is no t yet approved or cleared by the [place] FDA and  has been authorized for detection and/or diagnosis of SARS-CoV-2 by FDA under an Emergency Use Authorization (EUA). This EUA will remain  in effect (meaning this test can be used) for the duration of the COVID-19 declaration under Section 564(b)(1) of the Act, 21 U.S.C.section 360bbb-3(b)(1), unless the authorization is terminated  or revoked sooner.       Influenza A by PCR NEGATIVE NEGATIVE Final   Influenza B by PCR NEGATIVE NEGATIVE Final    Comment: (NOTE) The Xpert Xpress SARS-CoV-2/FLU/RSV plus assay is intended as an aid in the diagnosis of influenza from Nasopharyngeal swab specimens and should not be used as a sole basis for treatment. Nasal washings and aspirates are unacceptable for Xpert Xpress SARS-CoV-2/FLU/RSV testing.  Fact Sheet for Patients: BloggerCourse.com  Fact Sheet for Healthcare Providers: SeriousBroker.it  This test is not yet approved or cleared by the [place] FDA and has been authorized for detection and/or diagnosis of SARS-CoV-2 by FDA under an Emergency Use Authorization (EUA). This EUA will remain in effect (meaning this test can be used) for the duration of the COVID-19 declaration under Section 564(b)(1) of the Act, 21 U.S.C. section 360bbb-3(b)(1), unless the authorization is terminated or revoked.     Resp Syncytial Virus by PCR NEGATIVE NEGATIVE Final    Comment: (NOTE) Fact Sheet for Patients: BloggerCourse.com  Fact Sheet for Healthcare Providers: SeriousBroker.it  This test is not yet approved or cleared by the [place] FDA and has been authorized for detection and/or diagnosis of SARS-CoV-2 by FDA under an Emergency Use  Authorization (EUA). This EUA will remain in effect (meaning this test can be used) for the duration of the COVID-19 declaration under Section  564(b)(1) of the Act, 21 U.S.C. section 360bbb-3(b)(1), unless the authorization is terminated or revoked.  Performed at [facility], [address] [place], [place] [address]   Blood Culture (routine x 2)     Status: None (Preliminary result)   Collection Time: 06/08/23  3:10 PM   Specimen: BLOOD RIGHT FOREARM  Result Value Ref Range Status   Specimen Description   Final    BLOOD RIGHT FOREARM BOTTLES DRAWN AEROBIC AND ANAEROBIC   Special Requests   Final    Blood Culture results may not be optimal due to an inadequate volume of blood received in culture bottles   Culture   Final    NO GROWTH < 24 HOURS Performed at [facility], [address], [place], [place] [address]    Report Status PENDING  Incomplete  Blood Culture (routine x 2)     Status: None (Preliminary result)   Collection Time: 06/08/23  5:02 PM   Specimen: BLOOD LEFT FOREARM  Result Value Ref Range Status   Specimen Description   Final    BLOOD LEFT FOREARM BOTTLES DRAWN AEROBIC AND ANAEROBIC   Special Requests   Final    Blood Culture results may not be optimal due to an inadequate volume of blood received in culture bottles   Culture   Final    NO GROWTH < 24 HOURS Performed at [facility], [address], [place], [place] [address]    Report Status PENDING  Incomplete  MRSA Next Gen by PCR, Nasal     Status: Abnormal   Collection Time: 06/08/23  6:45 PM   Specimen: Nasal Mucosa; Nasal Swab  Result Value Ref Range Status   MRSA by PCR Next Gen DETECTED (A) NOT DETECTED Final    Comment: RESULT CALLED TO, READ BACK BY AND VERIFIED WITH: ROBERT MAYNARD [id number], VIRAY,J (NOTE) The GeneXpert MRSA Assay (FDA approved for NASAL specimens only), is one component of a comprehensive MRSA colonization surveillance program. It is not intended to diagnose MRSA infection  nor to guide or monitor treatment for MRSA infections. Test performance is not FDA approved in patients less than 25 years old. Performed at [facility], [address] [place], [place] [address]     Radiology Studies: CT Angio Chest PE W/Cm &/Or Wo Cm  Result Date: 06/08/2023 CLINICAL DATA:  Chest pain, cough, shortness of breath with hypoxia and hypotension EXAM: CT ANGIOGRAPHY CHEST WITH CONTRAST TECHNIQUE: Multidetector CT imaging of the chest was performed using the standard protocol during bolus administration of intravenous contrast. Multiplanar CT image reconstructions and MIPs were obtained to evaluate the vascular anatomy. RADIATION DOSE REDUCTION: This exam was performed according to the departmental dose-optimization program which includes automated exposure control, adjustment of the mA and/or kV according to patient size and/or use of iterative reconstruction technique. CONTRAST:  75mL OMNIPAQUE IOHEXOL 350 MG/ML SOLN COMPARISON:  Same day chest radiograph, CT chest dated 04/01/2023, CT abdomen and pelvis dated 04/17/2020 FINDINGS: Cardiovascular: The study is high quality for the evaluation of pulmonary embolism. There are no filling defects in the central, lobar, segmental or subsegmental pulmonary artery branches to suggest acute pulmonary embolism. Great vessels are normal in course and caliber. Normal heart size. No significant pericardial fluid/thickening. Coronary artery calcifications and aortic atherosclerosis. Mediastinum/Nodes: Imaged thyroid gland without nodules meeting criteria for imaging follow-up by size. Normal esophagus. No pathologically enlarged axillary, supraclavicular, mediastinal, or hilar lymph nodes. Lungs/Pleura: The central airways are patent. Apical predominant paraseptal emphysema. Scattered calcified granulomata,  as before. New irregular consolidation along the medial left apex. Adjacent scattered peribronchovascular ground-glass densities. Slightly decreased  irregular consolidation along the lingula and anterior left lower lobe. Chronic-appearing scarring, architectural distortion, and bronchiectasis are otherwise unchanged. No pneumothorax. No pleural effusion. Upper abdomen: Subcentimeter hypoattenuating focus along the peripheral spleen (4:85), small to characterize, but unchanged from 2021, likely benign. Musculoskeletal: No acute or abnormal lytic or blastic osseous lesions. Old right rib fractures. Multilevel degenerative changes of the thoracic spine. Unchanged superior endplate compression of T11. Unchanged anterior wedging of L1 dating back to 2021. Review of the MIP images confirms the above findings. IMPRESSION: 1. No evidence of pulmonary embolism. 2. New irregular consolidation along the medial left apex with adjacent scattered peribronchovascular ground-glass densities, likely infectious/inflammatory. 3. Slightly decreased irregular consolidation along the lingula and anterior left lower lobe superimposed on unchanged chronic-appearing scarring, architectural distortion, and bronchiectasis. 4. Aortic Atherosclerosis (ICD10-I70.0) and Emphysema (ICD10-J43.9). Coronary artery calcifications. Assessment for potential risk factor modification, dietary therapy or pharmacologic therapy may be warranted, if clinically indicated. Electronically Signed   By: Agustin Cree M.D.   On: 06/08/2023 16:28   DG Chest Portable 1 View  Result Date: 06/08/2023 CLINICAL DATA:  Shortness of breath EXAM: PORTABLE CHEST 1 VIEW COMPARISON:  Chest x-ray 12/27/2022.  Chest CT 04/01/2023. FINDINGS: The cardiomediastinal silhouette is within normal limits. Calcified granulomas are again seen in the right upper lobe. Scattered parenchymal interstitial opacities in linear opacity in the right mid lung appear unchanged from prior. Emphysematous changes are again noted. There is no new focal lung infiltrate. Costophrenic angles are clear. There is no pneumothorax or acute fracture.  IMPRESSION: 1. No acute cardiopulmonary process. 2. Emphysematous changes. 3. Scattered parenchymal interstitial opacities and linear opacity in the right mid lung appear unchanged from prior, favored as chronic. Electronically Signed   By: Darliss Cheney M.D.   On: 06/08/2023 15:24    Scheduled Meds:  atorvastatin  40 mg Oral q AM   Chlorhexidine Gluconate Cloth  6 each Topical Daily   diltiazem  240 mg Oral q AM   enoxaparin (LOVENOX) injection  40 mg Subcutaneous Q24H   famotidine  20 mg Oral QHS   fluticasone furoate-vilanterol  1 puff Inhalation Daily   gabapentin  300 mg Oral QHS   insulin aspart  0-15 Units Subcutaneous TID WC   insulin aspart  0-5 Units Subcutaneous QHS   insulin glargine-yfgn  12 Units Subcutaneous BID   ipratropium-albuterol  3 mL Nebulization Q6H   losartan  25 mg Oral Daily   mupirocin ointment  1 Application Nasal BID   pantoprazole  40 mg Oral q AM   [START ON 06/10/2023] predniSONE  40 mg Oral Q breakfast   sertraline  50 mg Oral q AM   tamsulosin  0.4 mg Oral q AM   Continuous Infusions:  azithromycin 500 mg (06/08/23 2107)   cefTRIAXone (ROCEPHIN)  IV 2 g (06/09/23 0304)   vancomycin Stopped (06/09/23 1700)    LOS: 1 day   Shon Hale M.D on 06/09/2023 at 6:12 PM  Go to [email or web address] - for contact info  [facility] - Office  [phone]  If 7PM-7AM, please contact night-coverage [email or web address] 06/09/2023, 6:12 PM  \

## 2023-06-09 NOTE — Progress Notes (Signed)
Pharmacy Antibiotic Note  [name] is a 67 y.o. male admitted on 06/08/2023 with pneumonia. Symptoms include a productive cough and shortness of breath. Patient has a history of COPD and is on 3 L at baseline. He was treated for a COPD exacerbation 2 weeks ago and was feeling better; however, 5 days ago he was put on Levaquin by his PCP for worsening symptoms. Pharmacy has been consulted for vancomycin dosing.  Lactic acid 1.2 WBC 15.1 Afebrile CT chest: New irregular consolidation along the medial left apex with adjacent scattered peribronchovascular ground-glass densities, likely infectious/inflammatory  Plan: Day 1 of antibiotics Adjust vancomycin to 1000 mg IV q12H eAUC 487, Cmax 31, Cmin 14 Scr 0.8, IBW, Vd 0.72 Continue to monitor renal function and follow culture results   Height: 5\' 7"  (170.2 cm) Weight: 77.1 kg (170 lb) IBW/kg (Calculated) : 66.1  Temp (24hrs), Avg:97.7 F (36.5 C), Min:96 F (35.6 C), Max:98.5 F (36.9 C)  Recent Labs  Lab 06/08/23 1510 06/09/23 0512  WBC 15.1* 5.8  CREATININE 1.27* 0.72  LATICACIDVEN 1.2  --     Estimated Creatinine Clearance: 83.8 mL/min (by C-G formula based on SCr of 0.72 mg/dL).    Allergies  Allergen Reactions   Lioresal [Baclofen] Other (See Comments)    Lethargy     Zocor [Simvastatin] Nausea Only    Antimicrobials this admission: 11/30 Vanc >>  11/30 Azitho x 1 11/30 Ceftriaxone x 1  Dose adjustments this admission: N/A  Microbiology results: 11/30 BCx: NGTD 11/30 MRSA PCR: detected 11/30 Flu/Covid/RSV: negative   Thank you for allowing pharmacy to be a part of this patient's care.  Orson Aloe, PharmD Clinical Pharmacist 06/09/2023 1:54 PM

## 2023-06-10 DIAGNOSIS — J9621 Acute and chronic respiratory failure with hypoxia: Secondary | ICD-10-CM | POA: Diagnosis not present

## 2023-06-10 LAB — GLUCOSE, CAPILLARY
Glucose-Capillary: 257 mg/dL — ABNORMAL HIGH (ref 70–99)
Glucose-Capillary: 291 mg/dL — ABNORMAL HIGH (ref 70–99)
Glucose-Capillary: 270 mg/dL — ABNORMAL HIGH (ref 70–99)
Glucose-Capillary: 317 mg/dL — ABNORMAL HIGH (ref 70–99)

## 2023-06-10 LAB — CREATININE, SERUM
Creatinine, Ser: 0.79 mg/dL (ref 0.61–1.24)
GFR, Estimated: >60 mL/min (ref >60)

## 2023-06-10 MED ORDER — INFLUENZA VAC A&B SURF ANT ADJ 0.5 ML IM SUSY: 0.5000 mL | PREFILLED_SYRINGE | INTRAMUSCULAR | Status: DC

## 2023-06-10 NOTE — Plan of Care (Signed)
  Problem: Education: Goal: Knowledge of General Education information will improve Description: Including pain rating scale, medication(s)/side effects and non-pharmacologic comfort measures Outcome: Progressing   Problem: Clinical Measurements: Goal: Ability to maintain clinical measurements within normal limits will improve Outcome: Progressing   Problem: Clinical Measurements: Goal: Will remain free from infection Outcome: Progressing   Problem: Clinical Measurements: Goal: Diagnostic test results will improve Outcome: Progressing   Problem: Clinical Measurements: Goal: Respiratory complications will improve Outcome: Progressing   Problem: Clinical Measurements: Goal: Cardiovascular complication will be avoided Outcome: Progressing   Problem: Activity: Goal: Risk for activity intolerance will decrease Outcome: Progressing   Problem: Nutrition: Goal: Adequate nutrition will be maintained Outcome: Progressing   Problem: Coping: Goal: Level of anxiety will decrease Outcome: Progressing   Problem: Elimination: Goal: Will not experience complications related to bowel motility Outcome: Progressing   Problem: Elimination: Goal: Will not experience complications related to urinary retention Outcome: Progressing   Problem: Pain Management: Goal: General experience of comfort will improve Outcome: Progressing   Problem: Safety: Goal: Ability to remain free from injury will improve Outcome: Progressing   Problem: Skin Integrity: Goal: Risk for impaired skin integrity will decrease Outcome: Progressing   Problem: Education: Goal: Knowledge of disease or condition will improve Outcome: Progressing   Problem: Education: Goal: Knowledge of the prescribed therapeutic regimen will improve Outcome: Progressing   Problem: Activity: Goal: Ability to tolerate increased activity will improve Outcome: Progressing   Problem: Activity: Goal: Will verbalize the  importance of balancing activity with adequate rest periods Outcome: Progressing   Problem: Respiratory: Goal: Ability to maintain a clear airway will improve Outcome: Progressing   Problem: Respiratory: Goal: Levels of oxygenation will improve Outcome: Progressing   Problem: Respiratory: Goal: Ability to maintain adequate ventilation will improve Outcome: Progressing

## 2023-06-10 NOTE — TOC CM/SW Note (Signed)
Transition of Care [facility]) - Inpatient Brief Assessment   Patient Details  Name: KENAY CONN MRN: [id number] Date of Birth: November 05, 1955  Transition of Care [facility]) CM/SW Contact:    Villa Herb, LCSWA Phone Number: 06/10/2023, 10:19 AM   Clinical Narrative: Patient wear 2L O2 at home supplied through [employer].   Transition of Care Department [facility]) has reviewed patient and no TOC needs have been identified at this time. We will continue to monitor patient advancement through interdisciplinary progression rounds. If new patient transition needs arise, please place a TOC consult.  Transition of Care Asessment: Insurance and Status: Insurance coverage has been reviewed Patient has primary care physician: Yes Home environment has been reviewed: From home Prior level of function:: Independent Prior/Current Home Services: No current home services Social Determinants of Health Reivew: SDOH reviewed no interventions necessary Readmission risk has been reviewed: Yes Transition of care needs: no transition of care needs at this time

## 2023-06-10 NOTE — Progress Notes (Signed)
PROGRESS NOTE  [name], is a 67 y.o. male, DOB - January 16, 1956, OZH:[id number]  Admit date - 06/08/2023   Admitting Physician No admitting provider for patient encounter.  Outpatient Primary MD for the patient is Carylon Perches, MD  LOS - 2  Chief Complaint  Patient presents with   Shortness of Breath       Brief Narrative:  67 y.o. male with medical history significant of diabetes, hypertension, COPD with chronic oxygen use of 3 to 4 L, NASH admitted on 06/08/2023 with acute on chronic hypoxic respiratory failure secondary to pneumonia    -Assessment and Plan: 1)CAP--- cough, dyspnea and increased oxygen requirement persist -06/10/23 became dyspneic and tachycardic and wheezy after ambulating short distance on the unit WBC 15.1 >> 5.8 --Continue Rocephin/azithromycin -Mucolytics and bronchodilators as ordered -Transfer out of stepdown unit to telemetry unit  2) acute on chronic hypoxic respiratory failure--at baseline patient uses 3 to 4 L of oxygen -Hypoxia worsened and required BiPAP on and off -Anticipate that O2 requirement will improve back to baseline with treatment of #1 above 06/10/23 became dyspneic and tachycardic and wheezy after ambulating short distance on the unit  3)DM2- .  A1c 7.2 reflecting uncontrolled DM with hyperglycemia PTA -Continue Semglee Use Novolog/Humalog Sliding scale insulin with Accu-Cheks/Fingersticks as ordered   4) acute anemia--Hgb is down to 12.0 from 14.1 on admission baseline usually around 13-14 -Suspect hemodilution related from IV fluids -No bleeding concerns  5)AKI----acute kidney injury  -  creatinine on admission= 1.27  ,  baseline creatinine = 0.7 to 0.9   ,  creatinine is now back to baseline with hydration, - renally adjust medications, avoid nephrotoxic agents / dehydration  / hypotension  6)HTN--stable, continue Cardizem and losartan  Status is: Inpatient   Disposition: The patient is from: Home               Anticipated d/c is to: Home              Anticipated d/c date is: 1 day              Patient currently is not medically stable to d/c. Barriers: Not Clinically Stable-   Code Status :  -  Code Status: Full Code   Family Communication:    (patient is alert, awake and coherent)  Discussed with wife  at bedside  DVT Prophylaxis  :   - SCDs   enoxaparin (LOVENOX) injection 40 mg Start: 06/08/23 2200   Lab Results  Component Value Date   PLT 85 (L) 06/09/2023   Inpatient Medications  Scheduled Meds:  atorvastatin  40 mg Oral q AM   Chlorhexidine Gluconate Cloth  6 each Topical Daily   dextromethorphan-guaiFENesin  1 tablet Oral BID   diltiazem  240 mg Oral q AM   enoxaparin (LOVENOX) injection  40 mg Subcutaneous Q24H   famotidine  20 mg Oral QHS   fluticasone furoate-vilanterol  1 puff Inhalation Daily   gabapentin  300 mg Oral QHS   insulin aspart  0-15 Units Subcutaneous TID WC   insulin aspart  0-5 Units Subcutaneous QHS   insulin glargine-yfgn  12 Units Subcutaneous BID   ipratropium-albuterol  3 mL Nebulization TID   losartan  25 mg Oral Daily   mupirocin ointment  1 Application Nasal BID   pantoprazole  40 mg Oral q AM   predniSONE  40 mg Oral Q breakfast   sertraline  50 mg Oral q AM   tamsulosin  0.4  mg Oral q AM   Continuous Infusions:  azithromycin Stopped (06/10/23 0718)   cefTRIAXone (ROCEPHIN)  IV Stopped (06/10/23 1051)   vancomycin 1,000 mg (06/10/23 1435)   PRN Meds:.acetaminophen **OR** acetaminophen, albuterol, ALPRAZolam, ondansetron **OR** ondansetron (ZOFRAN) IV, polyethylene glycol   Anti-infectives (From admission, onward)    Start     Dose/Rate Route Frequency Ordered Stop   06/09/23 1700  vancomycin (VANCOREADY) IVPB 1250 mg/250 mL  Status:  Discontinued        1,250 mg 166.7 mL/hr over 90 Minutes Intravenous Every 24 hours 06/08/23 1648 06/09/23 1354   06/09/23 1500  vancomycin (VANCOCIN) IVPB 1000 mg/200 mL premix        1,000 mg 200 mL/hr  over 60 Minutes Intravenous Every 12 hours 06/09/23 1353     06/09/23 0200  cefTRIAXone (ROCEPHIN) 2 g in sodium chloride 0.9 % 100 mL IVPB        2 g 200 mL/hr over 30 Minutes Intravenous Every 24 hours 06/08/23 1939 06/14/23 0159   06/08/23 2030  azithromycin (ZITHROMAX) 500 mg in sodium chloride 0.9 % 250 mL IVPB        500 mg 250 mL/hr over 60 Minutes Intravenous Every 24 hours 06/08/23 1939 06/13/23 2029   06/08/23 1645  cefTRIAXone (ROCEPHIN) 2 g in sodium chloride 0.9 % 100 mL IVPB  Status:  Discontinued        2 g 200 mL/hr over 30 Minutes Intravenous Every 24 hours 06/08/23 1636 06/08/23 1639   06/08/23 1645  azithromycin (ZITHROMAX) 500 mg in sodium chloride 0.9 % 250 mL IVPB  Status:  Discontinued        500 mg 250 mL/hr over 60 Minutes Intravenous Every 24 hours 06/08/23 1636 06/08/23 1639   06/08/23 1645  ceFEPIme (MAXIPIME) 1 g in sodium chloride 0.9 % 100 mL IVPB        1 g 200 mL/hr over 30 Minutes Intravenous  Once 06/08/23 1639 06/09/23 0722   06/08/23 1645  vancomycin (VANCOREADY) IVPB 1750 mg/350 mL        1,750 mg 175 mL/hr over 120 Minutes Intravenous  Once 06/08/23 1641 06/09/23 1610      Subjective: [name] today has no fevers, no emesis,  No chest pain,   became dyspneic and tachycardic and wheezy after ambulating short distance on the unit - Cough and dyspnea persist   Objective: Vitals:   06/10/23 0800 06/10/23 0919 06/10/23 1208 06/10/23 1319  BP: (!) 106/59 (!) 109/48    Pulse: 97     Resp: (!) 21     Temp:   (!) 97.4 F (36.3 C)   TempSrc:   Oral   SpO2: 96%   92%  Weight:      Height:        Intake/Output Summary (Last 24 hours) at 06/10/2023 1444 Last data filed at 06/10/2023 0847 Gross per 24 hour  Intake 1138.2 ml  Output 2400 ml  Net -1261.8 ml   Filed Weights   06/08/23 1450 06/08/23 1453 06/10/23 0600  Weight: 77.1 kg 77.1 kg 74.6 kg   Physical Exam  Gen:- Awake Alert, became dyspneic and tachycardic and wheezy after  ambulating short distance on the unit HEENT:- [place].AT, No sclera icterus Nose-  4L/min Neck-Supple Neck,No JVD,.  Lungs-diminished breath sounds, wheezes,   CV- S1, S2 normal, regular  Abd-  +ve B.Sounds, Abd Soft, No tenderness,    Extremity/Skin:- No  edema, pedal pulses present  Psych-affect is appropriate, oriented x3 Neuro-no  new focal deficits, no tremors  Data Reviewed: I have personally reviewed following labs and imaging studies  CBC: Recent Labs  Lab 06/08/23 1510 06/09/23 0512  WBC 15.1* 5.8  NEUTROABS 12.6*  --   HGB 14.0 12.0*  HCT 43.0 37.5*  MCV 84.3 86.0  PLT 126* 85*   Basic Metabolic Panel: Recent Labs  Lab 06/08/23 1510 06/09/23 0512 06/10/23 0325  NA 132* 137  --   K 5.1 4.3  --   CL 94* 106  --   CO2 27 23  --   GLUCOSE 176* 254*  --   BUN 16 15  --   CREATININE 1.27* 0.72 0.79  CALCIUM 9.2 7.9*  --    GFR: Estimated Creatinine Clearance: 83.8 mL/min (by C-G formula based on SCr of 0.79 mg/dL). Liver Function Tests: Recent Labs  Lab 06/08/23 1510  AST 17  ALT 26  ALKPHOS 87  BILITOT 1.7*  PROT 8.3*  ALBUMIN 3.6   HbA1C: Recent Labs    06/09/23 0512  HGBA1C 7.2*   Recent Results (from the past 240 hour(s))  Resp panel by RT-PCR (RSV, Flu A&B, Covid) Anterior Nasal Swab     Status: None   Collection Time: 06/08/23  3:10 PM   Specimen: Anterior Nasal Swab  Result Value Ref Range Status   SARS Coronavirus 2 by RT PCR NEGATIVE NEGATIVE Final    Comment: (NOTE) SARS-CoV-2 target nucleic acids are NOT DETECTED.  The SARS-CoV-2 RNA is generally detectable in upper respiratory specimens during the acute phase of infection. The lowest concentration of SARS-CoV-2 viral copies this assay can detect is 138 copies/mL. A negative result does not preclude SARS-Cov-2 infection and should not be used as the sole basis for treatment or other patient management decisions. A negative result may occur with  improper specimen  collection/handling, submission of specimen other than nasopharyngeal swab, presence of viral mutation(s) within the areas targeted by this assay, and inadequate number of viral copies(<138 copies/mL). A negative result must be combined with clinical observations, patient history, and epidemiological information. The expected result is Negative.  Fact Sheet for Patients:  BloggerCourse.com  Fact Sheet for Healthcare Providers:  SeriousBroker.it  This test is no t yet approved or cleared by the [place] FDA and  has been authorized for detection and/or diagnosis of SARS-CoV-2 by FDA under an Emergency Use Authorization (EUA). This EUA will remain  in effect (meaning this test can be used) for the duration of the COVID-19 declaration under Section 564(b)(1) of the Act, 21 U.S.C.section 360bbb-3(b)(1), unless the authorization is terminated  or revoked sooner.       Influenza A by PCR NEGATIVE NEGATIVE Final   Influenza B by PCR NEGATIVE NEGATIVE Final    Comment: (NOTE) The Xpert Xpress SARS-CoV-2/FLU/RSV plus assay is intended as an aid in the diagnosis of influenza from Nasopharyngeal swab specimens and should not be used as a sole basis for treatment. Nasal washings and aspirates are unacceptable for Xpert Xpress SARS-CoV-2/FLU/RSV testing.  Fact Sheet for Patients: BloggerCourse.com  Fact Sheet for Healthcare Providers: SeriousBroker.it  This test is not yet approved or cleared by the [place] FDA and has been authorized for detection and/or diagnosis of SARS-CoV-2 by FDA under an Emergency Use Authorization (EUA). This EUA will remain in effect (meaning this test can be used) for the duration of the COVID-19 declaration under Section 564(b)(1) of the Act, 21 U.S.C. section 360bbb-3(b)(1), unless the authorization is terminated or revoked.  Resp Syncytial  Virus by PCR NEGATIVE NEGATIVE Final    Comment: (NOTE) Fact Sheet for Patients: BloggerCourse.com  Fact Sheet for Healthcare Providers: SeriousBroker.it  This test is not yet approved or cleared by the [place] FDA and has been authorized for detection and/or diagnosis of SARS-CoV-2 by FDA under an Emergency Use Authorization (EUA). This EUA will remain in effect (meaning this test can be used) for the duration of the COVID-19 declaration under Section 564(b)(1) of the Act, 21 U.S.C. section 360bbb-3(b)(1), unless the authorization is terminated or revoked.  Performed at [facility], [address] [place], [place] [address]   Blood Culture (routine x 2)     Status: None (Preliminary result)   Collection Time: 06/08/23  3:10 PM   Specimen: BLOOD RIGHT FOREARM  Result Value Ref Range Status   Specimen Description   Final    BLOOD RIGHT FOREARM BOTTLES DRAWN AEROBIC AND ANAEROBIC   Special Requests   Final    Blood Culture results may not be optimal due to an inadequate volume of blood received in culture bottles   Culture   Final    NO GROWTH 2 DAYS Performed at [facility], [address], [place], [place] [address]    Report Status PENDING  Incomplete  Blood Culture (routine x 2)     Status: None (Preliminary result)   Collection Time: 06/08/23  5:02 PM   Specimen: BLOOD LEFT FOREARM  Result Value Ref Range Status   Specimen Description   Final    BLOOD LEFT FOREARM BOTTLES DRAWN AEROBIC AND ANAEROBIC   Special Requests   Final    Blood Culture results may not be optimal due to an inadequate volume of blood received in culture bottles   Culture   Final    NO GROWTH 2 DAYS Performed at [facility], [address], [place], [place] [address]    Report Status PENDING  Incomplete  MRSA Next Gen by PCR, Nasal     Status: Abnormal   Collection Time: 06/08/23  6:45 PM   Specimen: Nasal Mucosa; Nasal Swab  Result Value  Ref Range Status   MRSA by PCR Next Gen DETECTED (A) NOT DETECTED Final    Comment: RESULT CALLED TO, READ BACK BY AND VERIFIED WITH: ROBERT MAYNARD [id number], VIRAY,J (NOTE) The GeneXpert MRSA Assay (FDA approved for NASAL specimens only), is one component of a comprehensive MRSA colonization surveillance program. It is not intended to diagnose MRSA infection nor to guide or monitor treatment for MRSA infections. Test performance is not FDA approved in patients less than 27 years old. Performed at [facility], [address], [place], [place] [address]     Radiology Studies: CT Angio Chest PE W/Cm &/Or Wo Cm  Result Date: 06/08/2023 CLINICAL DATA:  Chest pain, cough, shortness of breath with hypoxia and hypotension EXAM: CT ANGIOGRAPHY CHEST WITH CONTRAST TECHNIQUE: Multidetector CT imaging of the chest was performed using the standard protocol during bolus administration of intravenous contrast. Multiplanar CT image reconstructions and MIPs were obtained to evaluate the vascular anatomy. RADIATION DOSE REDUCTION: This exam was performed according to the departmental dose-optimization program which includes automated exposure control, adjustment of the mA and/or kV according to patient size and/or use of iterative reconstruction technique. CONTRAST:  75mL OMNIPAQUE IOHEXOL 350 MG/ML SOLN COMPARISON:  Same day chest radiograph, CT chest dated 04/01/2023, CT abdomen and pelvis dated 04/17/2020 FINDINGS: Cardiovascular: The study is high quality for the evaluation of pulmonary embolism. There are  no filling defects in the central, lobar, segmental or subsegmental pulmonary artery branches to suggest acute pulmonary embolism. Great vessels are normal in course and caliber. Normal heart size. No significant pericardial fluid/thickening. Coronary artery calcifications and aortic atherosclerosis. Mediastinum/Nodes: Imaged thyroid gland without nodules meeting criteria for imaging follow-up by size.  Normal esophagus. No pathologically enlarged axillary, supraclavicular, mediastinal, or hilar lymph nodes. Lungs/Pleura: The central airways are patent. Apical predominant paraseptal emphysema. Scattered calcified granulomata, as before. New irregular consolidation along the medial left apex. Adjacent scattered peribronchovascular ground-glass densities. Slightly decreased irregular consolidation along the lingula and anterior left lower lobe. Chronic-appearing scarring, architectural distortion, and bronchiectasis are otherwise unchanged. No pneumothorax. No pleural effusion. Upper abdomen: Subcentimeter hypoattenuating focus along the peripheral spleen (4:85), small to characterize, but unchanged from 2021, likely benign. Musculoskeletal: No acute or abnormal lytic or blastic osseous lesions. Old right rib fractures. Multilevel degenerative changes of the thoracic spine. Unchanged superior endplate compression of T11. Unchanged anterior wedging of L1 dating back to 2021. Review of the MIP images confirms the above findings. IMPRESSION: 1. No evidence of pulmonary embolism. 2. New irregular consolidation along the medial left apex with adjacent scattered peribronchovascular ground-glass densities, likely infectious/inflammatory. 3. Slightly decreased irregular consolidation along the lingula and anterior left lower lobe superimposed on unchanged chronic-appearing scarring, architectural distortion, and bronchiectasis. 4. Aortic Atherosclerosis (ICD10-I70.0) and Emphysema (ICD10-J43.9). Coronary artery calcifications. Assessment for potential risk factor modification, dietary therapy or pharmacologic therapy may be warranted, if clinically indicated. Electronically Signed   By: Agustin Cree M.D.   On: 06/08/2023 16:28   DG Chest Portable 1 View  Result Date: 06/08/2023 CLINICAL DATA:  Shortness of breath EXAM: PORTABLE CHEST 1 VIEW COMPARISON:  Chest x-ray 12/27/2022.  Chest CT 04/01/2023. FINDINGS: The  cardiomediastinal silhouette is within normal limits. Calcified granulomas are again seen in the right upper lobe. Scattered parenchymal interstitial opacities in linear opacity in the right mid lung appear unchanged from prior. Emphysematous changes are again noted. There is no new focal lung infiltrate. Costophrenic angles are clear. There is no pneumothorax or acute fracture. IMPRESSION: 1. No acute cardiopulmonary process. 2. Emphysematous changes. 3. Scattered parenchymal interstitial opacities and linear opacity in the right mid lung appear unchanged from prior, favored as chronic. Electronically Signed   By: Darliss Cheney M.D.   On: 06/08/2023 15:24    Scheduled Meds:  atorvastatin  40 mg Oral q AM   Chlorhexidine Gluconate Cloth  6 each Topical Daily   dextromethorphan-guaiFENesin  1 tablet Oral BID   diltiazem  240 mg Oral q AM   enoxaparin (LOVENOX) injection  40 mg Subcutaneous Q24H   famotidine  20 mg Oral QHS   fluticasone furoate-vilanterol  1 puff Inhalation Daily   gabapentin  300 mg Oral QHS   insulin aspart  0-15 Units Subcutaneous TID WC   insulin aspart  0-5 Units Subcutaneous QHS   insulin glargine-yfgn  12 Units Subcutaneous BID   ipratropium-albuterol  3 mL Nebulization TID   losartan  25 mg Oral Daily   mupirocin ointment  1 Application Nasal BID   pantoprazole  40 mg Oral q AM   predniSONE  40 mg Oral Q breakfast   sertraline  50 mg Oral q AM   tamsulosin  0.4 mg Oral q AM   Continuous Infusions:  azithromycin Stopped (06/10/23 0718)   cefTRIAXone (ROCEPHIN)  IV Stopped (06/10/23 1051)   vancomycin 1,000 mg (06/10/23 1435)    LOS: 2 days   Christopher Biehn  Tarena [name] M.D on 06/10/2023 at 2:44 PM  Go to [email or web address] - for contact info  [facility] - Office  [phone]  If 7PM-7AM, please contact night-coverage [email or web address] 06/10/2023, 2:44 PM  \

## 2023-06-11 ENCOUNTER — Telehealth: Payer: Self-pay | Admitting: *Deleted

## 2023-06-11 ENCOUNTER — Telehealth: Payer: Self-pay

## 2023-06-11 DIAGNOSIS — J9621 Acute and chronic respiratory failure with hypoxia: Secondary | ICD-10-CM | POA: Diagnosis not present

## 2023-06-11 LAB — LEGIONELLA PNEUMOPHILA SEROGP 1 UR AG: L. pneumophila Serogp 1 Ur Ag: NEGATIVE

## 2023-06-11 LAB — GLUCOSE, CAPILLARY: Glucose-Capillary: 145 mg/dL — ABNORMAL HIGH (ref 70–99)

## 2023-06-11 MED ORDER — ACETAMINOPHEN 325 MG PO TABS: 650.0000 mg | ORAL_TABLET | Freq: Four times a day (QID) | ORAL | Status: AC | PRN

## 2023-06-11 MED ORDER — PREDNISONE 20 MG PO TABS: 20.0000 mg | ORAL_TABLET | Freq: Every day | ORAL | 0 refills | Status: AC

## 2023-06-11 MED ORDER — CEFDINIR 300 MG PO CAPS: 300.0000 mg | ORAL_CAPSULE | Freq: Two times a day (BID) | ORAL | 0 refills | Status: AC

## 2023-06-11 MED ORDER — AZITHROMYCIN 500 MG PO TABS: 500.0000 mg | ORAL_TABLET | Freq: Every day | ORAL | 0 refills | Status: AC

## 2023-06-11 MED ORDER — FAMOTIDINE 20 MG PO TABS: 20.0000 mg | ORAL_TABLET | Freq: Two times a day (BID) | ORAL | 11 refills | Status: DC

## 2023-06-11 MED ORDER — GUAIFENESIN ER 600 MG PO TB12: 600.0000 mg | ORAL_TABLET | Freq: Two times a day (BID) | ORAL | 2 refills | Status: DC

## 2023-06-11 MED ORDER — ALBUTEROL SULFATE (2.5 MG/3ML) 0.083% IN NEBU: 2.5000 mg | INHALATION_SOLUTION | RESPIRATORY_TRACT | 1 refills | Status: DC | PRN

## 2023-06-11 MED ORDER — ALBUTEROL SULFATE HFA 108 (90 BASE) MCG/ACT IN AERS: 2.0000 | INHALATION_SPRAY | RESPIRATORY_TRACT | 12 refills | Status: DC | PRN

## 2023-06-11 NOTE — Progress Notes (Signed)
Ng Discharge Note  Admit Date:  06/08/2023 Discharge date: 06/11/2023   OBI OGAZ to be D/C'd Home per MD order.  AVS completed. Patient/caregiver able to verbalize understanding.  Discharge Medication: Allergies as of 06/11/2023       Reactions   Lioresal [baclofen] Other (See Comments)   Lethargy     Zocor [simvastatin] Nausea Only        Medication List     STOP taking these medications    levofloxacin 500 MG tablet Commonly known as: LEVAQUIN       TAKE these medications    acetaminophen 325 MG tablet Commonly known as: TYLENOL Take 2 tablets (650 mg total) by mouth every 6 (six) hours as needed for mild pain (pain score 1-3) (or Fever >/= 101). What changed:  medication strength how much to take reasons to take this   albuterol (2.5 MG/3ML) 0.083% nebulizer solution Commonly known as: PROVENTIL Take 3 mLs (2.5 mg total) by nebulization every 2 (two) hours as needed for wheezing or shortness of breath (cough).   albuterol 108 (90 Base) MCG/ACT inhaler Commonly known as: VENTOLIN HFA Inhale 2 puffs into the lungs every 4 (four) hours as needed for wheezing or shortness of breath.   ALPRAZolam 0.25 MG tablet Commonly known as: XANAX Take 0.25 mg by mouth 2 (two) times daily as needed for sleep or anxiety.   atorvastatin 40 MG tablet Commonly known as: LIPITOR Take 40 mg by mouth in the morning.   azithromycin 500 MG tablet Commonly known as: ZITHROMAX Take 1 tablet (500 mg total) by mouth daily for 3 days.   BENEFIBER PO Take 1 Capful by mouth daily.   Breztri Aerosphere 160-9-4.8 MCG/ACT Aero Generic drug: Budeson-Glycopyrrol-Formoterol INHALE 2 PUFFS INTO THE LUNGS TWICE A DAY   cefdinir 300 MG capsule Commonly known as: OMNICEF Take 1 capsule (300 mg total) by mouth 2 (two) times daily for 5 days.   diltiazem 240 MG 24 hr capsule Commonly known as: CARDIZEM CD Take 240 mg by mouth in the morning.   famotidine 20 MG tablet Commonly  known as: Pepcid Take 1 tablet (20 mg total) by mouth 2 (two) times daily. What changed:  how much to take how to take this when to take this additional instructions   ferrous sulfate 325 (65 FE) MG tablet Take 325 mg by mouth in the morning.   fluticasone 50 MCG/ACT nasal spray Commonly known as: FLONASE Place 1 spray into both nostrils daily as needed for allergies or rhinitis.   furosemide 20 MG tablet Commonly known as: Lasix Take 1 tablet (20 mg total) by mouth daily.   gabapentin 300 MG capsule Commonly known as: NEURONTIN Take 1 capsule (300 mg total) by mouth at bedtime.   glimepiride 1 MG tablet Commonly known as: AMARYL Take 1 mg by mouth daily with breakfast.   guaiFENesin 600 MG 12 hr tablet Commonly known as: Mucinex Take 1 tablet (600 mg total) by mouth 2 (two) times daily.   losartan 25 MG tablet Commonly known as: COZAAR Take 25 mg by mouth daily.   metFORMIN 1000 MG tablet Commonly known as: GLUCOPHAGE Take 1,000 mg by mouth daily with supper.   multivitamin with minerals Tabs tablet Take 1 tablet by mouth at bedtime.   OXYGEN Inhale 3 L into the lungs continuous.   pantoprazole 40 MG tablet Commonly known as: PROTONIX Take 1 tablet (40 mg total) by mouth in the morning.   pioglitazone 15 MG tablet Commonly  known as: ACTOS Take 15 mg by mouth daily.   predniSONE 20 MG tablet Commonly known as: DELTASONE Take 1 tablet (20 mg total) by mouth daily with breakfast for 5 days.   pyridOXINE 50 MG tablet Commonly known as: B-6 Take 50 mg by mouth daily.   sertraline 50 MG tablet Commonly known as: ZOLOFT Take 1 tablet (50 mg total) by mouth daily. What changed: when to take this   spironolactone 25 MG tablet Commonly known as: Aldactone Take 1 tablet (25 mg total) by mouth daily.   tamsulosin 0.4 MG Caps capsule Commonly known as: FLOMAX Take 0.4 mg by mouth in the morning.   vitamin C 250 MG tablet Commonly known as: ASCORBIC  ACID Take 250 mg by mouth in the morning.   Vitamin D3 25 MCG (1000 UT) Caps Take 1,000 Units by mouth in the morning.        Discharge Assessment: Vitals:   06/11/23 0409 06/11/23 0707  BP: (!) 98/59   Pulse: 89   Resp:    Temp:    SpO2: 93% 94%   Skin clean, dry and intact without evidence of skin break down, no evidence of skin tears noted. IV catheter discontinued intact. Site without signs and symptoms of complications - no redness or edema noted at insertion site, patient denies c/o pain - only slight tenderness at site.  Dressing with slight pressure applied.  D/c Instructions-Education: Discharge instructions given to patient/family with verbalized understanding. D/c education completed with patient/family including follow up instructions, medication list, d/c activities limitations if indicated, with other d/c instructions as indicated by MD - patient able to verbalize understanding, all questions fully answered. Patient instructed to return to ED, call 911, or call MD for any changes in condition.  Patient escorted via WC, and D/C home via private auto.  Cristal Ford, LPN [id number]/0/4540 98:11 AM

## 2023-06-11 NOTE — Consult Note (Signed)
[facility] Liaison Note  06/11/2023  KARIEM BRISBANE Nov 08, 1955 [id number]  Location: RN Hospital Liaison screened the patient remotely at [facility].  Insurance: [employer]   [name] is a 67 y.o. male who is a Primary Care Patient of Carylon Perches, MD. The patient was screened for  readmission hospitalization with noted medium risk score for unplanned readmission risk with 2 IP in 6 months.  The patient was assessed for potential Care Management service needs for post hospital transition for care coordination. Review of patient's electronic medical record reveals patient was admitted Acute on chronic respiratory with hypoxia. Spoke with pt's spouse DPR Dois Davenport) and introduced VBCI services. Offered a post hospital prevention follow up call post discharged. Pt going home with self care and his supportive spouse. Wife receptive to a scheduled follow up call. Also contacted [facility] spoke with [facility] and verified pt is engaged with their care management program. Will contact CMA with VBCI for TOC as this provider does not have TOC assigned for this office. No other request or issues to address at this time.  Plan: [facility] Liaison will continue to follow progress and disposition to asess for post hospital community care coordination/management needs.  Referral request for community care coordination: anticipate Transitions of Care Team follow up.   VBCI Care Management/Population Health does not replace or interfere with any arrangements made by the Inpatient Transition of Care team.   For questions contact:   Elliot Cousin, RN, [facility] Liaison [place]   [facility], Population Health Office Hours MTWF  8:00 am-6:00 pm Direct Dial: [phone] mobile [phone] [Office toll free line] Office Hours are M-F 8:30 - 5 pm [email or web address]
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
## 2023-06-11 NOTE — Progress Notes (Signed)
  Care Coordination  Outreach Note  06/11/2023 Name: JHON JUDE MRN: [id number] DOB: 1955-11-05   Care Coordination Outreach Attempts: An unsuccessful telephone outreach was attempted today to offer the patient information about available care coordination services.  Follow Up Plan:  Additional outreach attempts will be made to offer the patient care coordination information and services.   Encounter Outcome:  No Answer  Gwenevere Ghazi  Care Coordination Care Guide  Direct Dial: [phone]

## 2023-06-11 NOTE — Care Management Important Message (Signed)
Important Message  Patient Details  Name: [name] MRN: [id number] Date of Birth: [birth year]   Important Message Given:  N/A - LOS <3 / Initial given by admissions     Corey Harold 06/11/2023, 10:26 AM

## 2023-06-11 NOTE — Inpatient Diabetes Management (Signed)
Inpatient Diabetes Program Recommendations  AACE/ADA: New Consensus Statement on Inpatient Glycemic Control   Target Ranges:  Prepandial:   less than 140 mg/dL      Peak postprandial:   less than 180 mg/dL (1-2 hours)      Critically ill patients:  140 - 180 mg/dL    Latest Reference Range & Units 06/10/23 07:24 06/10/23 11:34 06/10/23 16:44 06/10/23 20:00 06/11/23 07:20  Glucose-Capillary 70 - 99 mg/dL 482 (H) 500 (H) 370 (H) 317 (H) 145 (H)    Review of Glycemic Control  Diabetes history: DM2 Outpatient Diabetes medications: Amaryl 1 mg daily, Metformin 1000 mg QPM, Actos 15 mg daily Current orders for Inpatient glycemic control: Semglee 12 units BID, Novolog 0-15 units TID with meals, Novolog 0-5 units at bedtime; Prednisone 40 mg QAM  Inpatient Diabetes Program Recommendations:    Insulin: If steroids are continued, please consider ordering Novolog 4 units TID with meals for meal coverage if patient eats at least 50% of meals.  Thanks, Orlando Penner, RN, MSN, CDCES Diabetes Coordinator Inpatient Diabetes Program [phone] (Team Pager from 8am to 5pm)

## 2023-06-11 NOTE — Discharge Summary (Signed)
[name], is a 67 y.o. male  DOB 02/26/1956  MRN [id number].  Admission date:  06/08/2023  Admitting Physician  No admitting provider for patient encounter.  Discharge Date:  06/11/2023   Primary MD  Carylon Perches, MD  Recommendations for primary care physician for things to follow:  1)You need oxygen at home at 3 L via nasal cannula continuously while awake and while asleep--- smoking or having open fires around oxygen can cause fire, significant injury and death  2)Follow up with Carylon Perches, MD --Primary Care Physican in about a week or so for recheck and reevaluation  3)Please Take antibiotics as prescribed  Admission Diagnosis  Acute on chronic respiratory failure with hypoxia ([name]) [J96.21] Pneumonia due to infectious organism, unspecified laterality, unspecified part of lung [J18.9]   Discharge Diagnosis  Acute on chronic respiratory failure with hypoxia ([name]) [J96.21] Pneumonia due to infectious organism, unspecified laterality, unspecified part of lung [J18.9]    Principal Problem:   Acute on chronic respiratory failure with hypoxia ([name]) Active Problems:   Gastroesophageal reflux disease   HTN (hypertension)   Type 2 diabetes mellitus without complication, without long-term current use of insulin ([name])   COPD with acute exacerbation ([name])   CAP (community acquired pneumonia)   AKI (acute kidney injury) ([name])      Past Medical History:  Diagnosis Date   Anxiety    Arthritis    Asthma    Chest pain    a. 2003 Cath: nl cors.   Concussion    COPD (chronic obstructive pulmonary disease) ([name])    chronically on 3-4L [place]   Depression    Diabetes mellitus    Dyslipidemia    Tachycardia   Dyspnea    Dysrhythmia    Fracture 06/2014   lower back "L-1"   GERD (gastroesophageal reflux disease)    Hypertension    NASH (nonalcoholic steatohepatitis) 2018   [name], undergoing  routine cirrhosis care; [name]   Pneumonia 12/01/2021   PONV (postoperative nausea and vomiting)    Splenomegaly    Thrombocytopenia ([name])     Past Surgical History:  Procedure Laterality Date   BIOPSY  07/26/2016   Procedure: BIOPSY;  Surgeon: [name], MD;  [facility];  Service: Endoscopy;;  gastric   CARDIAC CATHETERIZATION  10/22/2001   normal coronary arteriea (Dr. Aram Candela)   COLONOSCOPY  08/31/2011   WUJ:[id number] rectal and colon polyps-treated. Single tubular adenoma. Next TCS 08/2016   COLONOSCOPY N/A 07/26/2016   Dr. [name]: diverticulosis in sigmoid and descending colon. 6 mm benign splenic flexure. surveillance 5 yeras   COLONOSCOPY WITH PROPOFOL N/A 08/07/2021   Surgeon: [name], DO;  Nonbleeding internal hemorrhoids, three 3-6 mm polyps in the transverse colon resected and retrieved, otherwise normal exam.  Pathology with 1 tubular adenoma, otherwise benign colonic mucosa.  Recommended 5-year repeat.   ESOPHAGOGASTRODUODENOSCOPY N/A 07/26/2016   Dr. [name]: normal esophagus, portal gastropathy, chronic gastritis, normal duodenum, screening EGD 2  years    ESOPHAGOGASTRODUODENOSCOPY (EGD) WITH ESOPHAGEAL DILATION N/A 05/15/2013   ZOX:WRUEAV esophagus-status post Elease Hashimoto dilation/Portal gastropathy. Antral erosions-status post biopsy. extrinsic compression along lesser curvature likely secondary to splenomegaly. gastric bx benign   ESOPHAGOGASTRODUODENOSCOPY (EGD) WITH PROPOFOL N/A 12/12/2020   Surgeon: [name], MD; ormal esophagus s/p dilation, portal hypertensive gastropathy.  Due for repeat in 2024.   ESOPHAGOGASTRODUODENOSCOPY (EGD) WITH PROPOFOL N/A 05/22/2023   Procedure: ESOPHAGOGASTRODUODENOSCOPY (EGD) WITH PROPOFOL;  Surgeon: [name], MD;  [facility];  Service: Endoscopy;  Laterality: N/A;  9:30 am, asa 3   ETHMOIDECTOMY Bilateral 03/02/2022   Procedure: ETHMOIDECTOMY;   Surgeon: [name], MD;  Location: [facility] OR;  Service: ENT;  Laterality: Bilateral;   HERNIA REPAIR  03/2009   MALONEY DILATION N/A 12/12/2020   Procedure: Elease Hashimoto DILATION;  Surgeon: [name], MD;  [facility];  Service: Endoscopy;  Laterality: N/A;   MAXILLARY ANTROSTOMY Bilateral 03/02/2022   Procedure: MAXILLARY ANTROSTOMY;  Surgeon: [name], MD;  Location: [facility] OR;  Service: ENT;  Laterality: Bilateral;   NASAL SEPTOPLASTY W/ TURBINOPLASTY Bilateral 03/02/2022   Procedure: NASAL SEPTOPLASTY WITH TURBINATE REDUCTION;  Surgeon: [name], MD;  Location: [facility] OR;  Service: ENT;  Laterality: Bilateral;   POLYPECTOMY  07/26/2016   Procedure: POLYPECTOMY;  Surgeon: [name], MD;  [facility];  Service: Endoscopy;;  colon   POLYPECTOMY  08/07/2021   Procedure: POLYPECTOMY;  Surgeon: [name], DO;  [facility];  Service: Endoscopy;;   SINUS ENDO WITH FUSION N/A 03/02/2022   Procedure: SINUS ENDO WITH FUSION;  Surgeon: [name], MD;  Location: [facility];  Service: ENT;  Laterality: N/A;   TRANSTHORACIC ECHOCARDIOGRAM  2011   EF 50-55%, stage 1 diastolic dysfunction, trace TR & pulm valve regurg    VASECTOMY  1989     HPI  from the history and physical done on the day of admission:  HPI: [name] is a 67 y.o. male with medical history significant of diabetes, hypertension, COPD with chronic oxygen use of 3 to 4 L, NASH.  Patient presents with 2 weeks of shortness of breath that have been increasing.  Was initially treated for COPD exacerbation with prednisone and doxycycline.  He felt somewhat better, then became worse again and was placed on Levaquin and not steroids.  He has continued to have increasing shortness of breath and productive cough.  No fevers, abdominal pain, nausea, vomiting.    On arrival here, he was noted to be severely hypoxic and was placed on BiPAP with good improvement of his oxygen.   Review of Systems: As mentioned in the  history of present illness. All other systems reviewed and are negative.   Hospital Course:     Brief Narrative:  67 y.o. male with medical history significant of diabetes, hypertension, COPD with chronic oxygen use of 3 to 4 L, NASH admitted on 06/08/2023 with acute on chronic hypoxic respiratory failure secondary to pneumonia    -Assessment and Plan: 1)CAP--- cough and dyspnea has improved -No significant dyspnea on exertion - oxygen requirement is back to baseline WBC 15.1 >> 5.8 --- Treated with Rocephin/azithromycin -Discharge on Omnicef and azithromycin -Mucolytics and bronchodilators    2) acute on chronic hypoxic respiratory failure--at baseline patient uses 3 to 4 L of oxygen --Weaned off BiPAP -Respiratory status has improved significantly, see #1 above -Discharge on p.o. prednisone -Oxygen requirement is back to baseline   3)DM2- .  A1c 7.2 reflecting uncontrolled DM with hyperglycemia PTA -Restart Actos, glimepiride and metformin   4) acute anemia--Hgb is down to 12.0 from 14.1 on admission baseline usually around 13-14 -Suspect hemodilution related from IV fluids -No bleeding concerns   5)AKI----acute kidney injury  -  creatinine on admission= 1.27  ,  baseline creatinine = 0.7 to 0.9   ,  creatinine is now back to baseline with hydration, - renally adjust medications, avoid nephrotoxic agents / dehydration  / hypotension   6)HTN--stable, continue Cardizem and losartan   Disposition: The patient is from: Home              Anticipated d/c is to: Home  Discharge Condition: stable  Follow UP   Follow-up Information     Carylon Perches, MD. Schedule an appointment as soon as possible for a visit in 1 week(s).   Specialty: Internal Medicine Contact information: [address] [place] [address] [phone]                 Diet and Activity recommendation:  As advised  Discharge Instructions    Discharge Instructions     Call MD for:   difficulty breathing, headache or visual disturbances   Complete by: As directed    Call MD for:  persistant dizziness or light-headedness   Complete by: As directed    Call MD for:  persistant nausea and vomiting   Complete by: As directed    Call MD for:  temperature >100.4   Complete by: As directed    Diet - low sodium heart healthy   Complete by: As directed    Diet Carb Modified   Complete by: As directed    Discharge instructions   Complete by: As directed    1)You need oxygen at home at 3 L via nasal cannula continuously while awake and while asleep--- smoking or having open fires around oxygen can cause fire, significant injury and death  2)Follow up with Carylon Perches, MD --Primary Care Physican in about a week or so for recheck and reevaluation  3)Please Take antibiotics as prescribed   Increase activity slowly   Complete by: As directed         Discharge Medications     Allergies as of 06/11/2023       Reactions   Lioresal [baclofen] Other (See Comments)   Lethargy     Zocor [simvastatin] Nausea Only        Medication List     STOP taking these medications    levofloxacin 500 MG tablet Commonly known as: LEVAQUIN       TAKE these medications    acetaminophen 325 MG tablet Commonly known as: TYLENOL Take 2 tablets (650 mg total) by mouth every 6 (six) hours as needed for mild pain (pain score 1-3) (or Fever >/= 101). What changed:  medication strength how much to take reasons to take this   albuterol (2.5 MG/3ML) 0.083% nebulizer solution Commonly known as: PROVENTIL Take 3 mLs (2.5 mg total) by nebulization every 2 (two) hours as needed for wheezing or shortness of breath (cough).   albuterol 108 (90 Base) MCG/ACT inhaler Commonly known as: VENTOLIN HFA Inhale 2 puffs into the lungs every 4 (four) hours as needed for wheezing or shortness of breath.   ALPRAZolam 0.25 MG tablet Commonly known as: XANAX Take 0.25 mg by mouth 2 (two) times  daily as needed for sleep or anxiety.   atorvastatin 40 MG tablet Commonly known as:  LIPITOR Take 40 mg by mouth in the morning.   azithromycin 500 MG tablet Commonly known as: ZITHROMAX Take 1 tablet (500 mg total) by mouth daily for 3 days.   BENEFIBER PO Take 1 Capful by mouth daily.   Breztri Aerosphere 160-9-4.8 MCG/ACT Aero Generic drug: Budeson-Glycopyrrol-Formoterol INHALE 2 PUFFS INTO THE LUNGS TWICE A DAY   cefdinir 300 MG capsule Commonly known as: OMNICEF Take 1 capsule (300 mg total) by mouth 2 (two) times daily for 5 days.   diltiazem 240 MG 24 hr capsule Commonly known as: CARDIZEM CD Take 240 mg by mouth in the morning.   famotidine 20 MG tablet Commonly known as: Pepcid Take 1 tablet (20 mg total) by mouth 2 (two) times daily. What changed:  how much to take how to take this when to take this additional instructions   ferrous sulfate 325 (65 FE) MG tablet Take 325 mg by mouth in the morning.   fluticasone 50 MCG/ACT nasal spray Commonly known as: FLONASE Place 1 spray into both nostrils daily as needed for allergies or rhinitis.   furosemide 20 MG tablet Commonly known as: Lasix Take 1 tablet (20 mg total) by mouth daily.   gabapentin 300 MG capsule Commonly known as: NEURONTIN Take 1 capsule (300 mg total) by mouth at bedtime.   glimepiride 1 MG tablet Commonly known as: AMARYL Take 1 mg by mouth daily with breakfast.   guaiFENesin 600 MG 12 hr tablet Commonly known as: Mucinex Take 1 tablet (600 mg total) by mouth 2 (two) times daily.   losartan 25 MG tablet Commonly known as: COZAAR Take 25 mg by mouth daily.   metFORMIN 1000 MG tablet Commonly known as: GLUCOPHAGE Take 1,000 mg by mouth daily with supper.   multivitamin with minerals Tabs tablet Take 1 tablet by mouth at bedtime.   OXYGEN Inhale 3 L into the lungs continuous.   pantoprazole 40 MG tablet Commonly known as: PROTONIX Take 1 tablet (40 mg total) by mouth in the  morning.   pioglitazone 15 MG tablet Commonly known as: ACTOS Take 15 mg by mouth daily.   predniSONE 20 MG tablet Commonly known as: DELTASONE Take 1 tablet (20 mg total) by mouth daily with breakfast for 5 days.   pyridOXINE 50 MG tablet Commonly known as: B-6 Take 50 mg by mouth daily.   sertraline 50 MG tablet Commonly known as: ZOLOFT Take 1 tablet (50 mg total) by mouth daily. What changed: when to take this   spironolactone 25 MG tablet Commonly known as: Aldactone Take 1 tablet (25 mg total) by mouth daily.   tamsulosin 0.4 MG Caps capsule Commonly known as: FLOMAX Take 0.4 mg by mouth in the morning.   vitamin C 250 MG tablet Commonly known as: ASCORBIC ACID Take 250 mg by mouth in the morning.   Vitamin D3 25 MCG (1000 UT) Caps Take 1,000 Units by mouth in the morning.        Major procedures and Radiology Reports - PLEASE review detailed and final reports for all details, in brief -   CT Angio Chest PE W/Cm &/Or Wo Cm  Result Date: 06/08/2023 CLINICAL DATA:  Chest pain, cough, shortness of breath with hypoxia and hypotension EXAM: CT ANGIOGRAPHY CHEST WITH CONTRAST TECHNIQUE: Multidetector CT imaging of the chest was performed using the standard protocol during bolus administration of intravenous contrast. Multiplanar CT image reconstructions and MIPs were obtained to evaluate the vascular anatomy. RADIATION DOSE REDUCTION: This exam was performed  according to the departmental dose-optimization program which includes automated exposure control, adjustment of the mA and/or kV according to patient size and/or use of iterative reconstruction technique. CONTRAST:  75mL OMNIPAQUE IOHEXOL 350 MG/ML SOLN COMPARISON:  Same day chest radiograph, CT chest dated 04/01/2023, CT abdomen and pelvis dated 04/17/2020 FINDINGS: Cardiovascular: The study is high quality for the evaluation of pulmonary embolism. There are no filling defects in the central, lobar, segmental or  subsegmental pulmonary artery branches to suggest acute pulmonary embolism. Great vessels are normal in course and caliber. Normal heart size. No significant pericardial fluid/thickening. Coronary artery calcifications and aortic atherosclerosis. Mediastinum/Nodes: Imaged thyroid gland without nodules meeting criteria for imaging follow-up by size. Normal esophagus. No pathologically enlarged axillary, supraclavicular, mediastinal, or hilar lymph nodes. Lungs/Pleura: The central airways are patent. Apical predominant paraseptal emphysema. Scattered calcified granulomata, as before. New irregular consolidation along the medial left apex. Adjacent scattered peribronchovascular ground-glass densities. Slightly decreased irregular consolidation along the lingula and anterior left lower lobe. Chronic-appearing scarring, architectural distortion, and bronchiectasis are otherwise unchanged. No pneumothorax. No pleural effusion. Upper abdomen: Subcentimeter hypoattenuating focus along the peripheral spleen (4:85), small to characterize, but unchanged from 2021, likely benign. Musculoskeletal: No acute or abnormal lytic or blastic osseous lesions. Old right rib fractures. Multilevel degenerative changes of the thoracic spine. Unchanged superior endplate compression of T11. Unchanged anterior wedging of L1 dating back to 2021. Review of the MIP images confirms the above findings. IMPRESSION: 1. No evidence of pulmonary embolism. 2. New irregular consolidation along the medial left apex with adjacent scattered peribronchovascular ground-glass densities, likely infectious/inflammatory. 3. Slightly decreased irregular consolidation along the lingula and anterior left lower lobe superimposed on unchanged chronic-appearing scarring, architectural distortion, and bronchiectasis. 4. Aortic Atherosclerosis (ICD10-I70.0) and Emphysema (ICD10-J43.9). Coronary artery calcifications. Assessment for potential risk factor modification,  dietary therapy or pharmacologic therapy may be warranted, if clinically indicated. Electronically Signed   By: Agustin Cree M.D.   On: 06/08/2023 16:28   DG Chest Portable 1 View  Result Date: 06/08/2023 CLINICAL DATA:  Shortness of breath EXAM: PORTABLE CHEST 1 VIEW COMPARISON:  Chest x-ray 12/27/2022.  Chest CT 04/01/2023. FINDINGS: The cardiomediastinal silhouette is within normal limits. Calcified granulomas are again seen in the right upper lobe. Scattered parenchymal interstitial opacities in linear opacity in the right mid lung appear unchanged from prior. Emphysematous changes are again noted. There is no new focal lung infiltrate. Costophrenic angles are clear. There is no pneumothorax or acute fracture. IMPRESSION: 1. No acute cardiopulmonary process. 2. Emphysematous changes. 3. Scattered parenchymal interstitial opacities and linear opacity in the right mid lung appear unchanged from prior, favored as chronic. Electronically Signed   By: Darliss Cheney M.D.   On: 06/08/2023 15:24   US Abdomen Complete  Result Date: 05/25/2023 CLINICAL DATA:  Cirrhosis EXAM: ABDOMEN ULTRASOUND COMPLETE COMPARISON:  Ultrasound abdomen 01/26/2022 FINDINGS: Gallbladder: No gallstones or wall thickening visualized. No sonographic Murphy sign noted by sonographer. Common bile duct: Diameter: 4 mm Liver: Coarsened echogenicity and nodular contour. No focal lesion. Portal vein is patent on color Doppler imaging with normal direction of blood flow towards the liver. IVC: No abnormality visualized. Pancreas: Visualized portion unremarkable. Spleen: Size and appearance within normal limits. Right Kidney: Length: 12.0 cm. Echogenicity within normal limits. No mass or hydronephrosis visualized. Possible 5 mm stone right kidney. Left Kidney: Length: 11.7 cm. Normal renal cortical thickness and echogenicity. Partially exophytic 1 cm cyst. No imaging follow-up needed. Abdominal aorta: No aneurysm visualized. Other findings: None.  IMPRESSION: 1. Cirrhotic morphology of the liver. No focal lesion. 2. Possible 5 mm stone right kidney. Electronically Signed   By: Annia Belt M.D.   On: 05/25/2023 22:32    Micro Results    Recent Results (from the past 240 hour(s))  Resp panel by RT-PCR (RSV, Flu A&B, Covid) Anterior Nasal Swab     Status: None   Collection Time: 06/08/23  3:10 PM   Specimen: Anterior Nasal Swab  Result Value Ref Range Status   SARS Coronavirus 2 by RT PCR NEGATIVE NEGATIVE Final    Comment: (NOTE) SARS-CoV-2 target nucleic acids are NOT DETECTED.  The SARS-CoV-2 RNA is generally detectable in upper respiratory specimens during the acute phase of infection. The lowest concentration of SARS-CoV-2 viral copies this assay can detect is 138 copies/mL. A negative result does not preclude SARS-Cov-2 infection and should not be used as the sole basis for treatment or other patient management decisions. A negative result may occur with  improper specimen collection/handling, submission of specimen other than nasopharyngeal swab, presence of viral mutation(s) within the areas targeted by this assay, and inadequate number of viral copies(<138 copies/mL). A negative result must be combined with clinical observations, patient history, and epidemiological information. The expected result is Negative.  Fact Sheet for Patients:  BloggerCourse.com  Fact Sheet for Healthcare Providers:  SeriousBroker.it  This test is no t yet approved or cleared by the [place] FDA and  has been authorized for detection and/or diagnosis of SARS-CoV-2 by FDA under an Emergency Use Authorization (EUA). This EUA will remain  in effect (meaning this test can be used) for the duration of the COVID-19 declaration under Section 564(b)(1) of the Act, 21 U.S.C.section 360bbb-3(b)(1), unless the authorization is terminated  or revoked sooner.       Influenza A by PCR NEGATIVE  NEGATIVE Final   Influenza B by PCR NEGATIVE NEGATIVE Final    Comment: (NOTE) The Xpert Xpress SARS-CoV-2/FLU/RSV plus assay is intended as an aid in the diagnosis of influenza from Nasopharyngeal swab specimens and should not be used as a sole basis for treatment. Nasal washings and aspirates are unacceptable for Xpert Xpress SARS-CoV-2/FLU/RSV testing.  Fact Sheet for Patients: BloggerCourse.com  Fact Sheet for Healthcare Providers: SeriousBroker.it  This test is not yet approved or cleared by the [place] FDA and has been authorized for detection and/or diagnosis of SARS-CoV-2 by FDA under an Emergency Use Authorization (EUA). This EUA will remain in effect (meaning this test can be used) for the duration of the COVID-19 declaration under Section 564(b)(1) of the Act, 21 U.S.C. section 360bbb-3(b)(1), unless the authorization is terminated or revoked.     Resp Syncytial Virus by PCR NEGATIVE NEGATIVE Final    Comment: (NOTE) Fact Sheet for Patients: BloggerCourse.com  Fact Sheet for Healthcare Providers: SeriousBroker.it  This test is not yet approved or cleared by the [place] FDA and has been authorized for detection and/or diagnosis of SARS-CoV-2 by FDA under an Emergency Use Authorization (EUA). This EUA will remain in effect (meaning this test can be used) for the duration of the COVID-19 declaration under Section 564(b)(1) of the Act, 21 U.S.C. section 360bbb-3(b)(1), unless the authorization is terminated or revoked.  Performed at [facility], [address] [place], [place] [address]   Blood Culture (routine x 2)     Status: None (Preliminary result)   Collection Time: 06/08/23  3:10 PM   Specimen: BLOOD RIGHT FOREARM  Result Value Ref Range Status  Specimen Description   Final    BLOOD RIGHT FOREARM BOTTLES DRAWN AEROBIC AND ANAEROBIC   Special  Requests   Final    Blood Culture results may not be optimal due to an inadequate volume of blood received in culture bottles   Culture   Final    NO GROWTH 2 DAYS Performed at [facility], [address] [place], [place] [address]    Report Status PENDING  Incomplete  Blood Culture (routine x 2)     Status: None (Preliminary result)   Collection Time: 06/08/23  5:02 PM   Specimen: BLOOD LEFT FOREARM  Result Value Ref Range Status   Specimen Description   Final    BLOOD LEFT FOREARM BOTTLES DRAWN AEROBIC AND ANAEROBIC   Special Requests   Final    Blood Culture results may not be optimal due to an inadequate volume of blood received in culture bottles   Culture   Final    NO GROWTH 2 DAYS Performed at [facility], [address] [place], [place] [address]    Report Status PENDING  Incomplete  MRSA Next Gen by PCR, Nasal     Status: Abnormal   Collection Time: 06/08/23  6:45 PM   Specimen: Nasal Mucosa; Nasal Swab  Result Value Ref Range Status   MRSA by PCR Next Gen DETECTED (A) NOT DETECTED Final    Comment: RESULT CALLED TO, READ BACK BY AND VERIFIED WITH: ROBERT MAYNARD [id number], VIRAY,J (NOTE) The GeneXpert MRSA Assay (FDA approved for NASAL specimens only), is one component of a comprehensive MRSA colonization surveillance program. It is not intended to diagnose MRSA infection nor to guide or monitor treatment for MRSA infections. Test performance is not FDA approved in patients less than 28 years old. Performed at [facility], [address], [place], [place] [address]    Today   Subjective    [name] today has no new complaints  No fever  Or chills  -Cough and dyspnea has improved -Oxygen requirement is back to baseline        -Wife at bedside, questions answered  Patient has been seen and examined prior to discharge   Objective   Blood pressure (!) 98/59, pulse 89, temperature 97.8 F (36.6 C), resp. rate 20, height 5\' 7"  (1.702 m), weight 77.2 kg,  SpO2 94%.   Intake/Output Summary (Last 24 hours) at 06/11/2023 1042 Last data filed at 06/11/2023 0956 Gross per 24 hour  Intake 1101.23 ml  Output 400 ml  Net 701.23 ml    Exam Gen:- Awake Alert, no acute distress  HEENT:- [place].AT, No sclera icterus Nose- [place] 2L/min Neck-Supple Neck,No JVD,.  Lungs-improved air movement, no wheezing CV- S1, S2 normal, regular Abd-  +ve B.Sounds, Abd Soft, No tenderness,    Extremity/Skin:- No  edema,   good pulses Psych-affect is appropriate, oriented x3 Neuro-no new focal deficits, no tremors    Data Review   CBC w Diff:  Lab Results  Component Value Date   WBC 5.8 06/09/2023   HGB 12.0 (L) 06/09/2023   HGB 13.6 05/15/2023   HCT 37.5 (L) 06/09/2023   HCT 43.3 05/15/2023   PLT 85 (L) 06/09/2023   PLT 104 (L) 05/15/2023   LYMPHOPCT 7 06/08/2023   BANDSPCT 4 08/18/2020   MONOPCT 7 06/08/2023   EOSPCT 0 06/08/2023   BASOPCT 0 06/08/2023   CMP:  Lab Results  Component Value Date   NA 137 06/09/2023   NA 142 05/15/2023  K 4.3 06/09/2023   CL 106 06/09/2023   CO2 23 06/09/2023   BUN 15 06/09/2023   BUN 12 05/15/2023   CREATININE 0.79 06/10/2023   CREATININE 0.86 04/24/2013   PROT 8.3 (H) 06/08/2023   PROT 8.0 05/15/2023   ALBUMIN 3.6 06/08/2023   ALBUMIN 4.5 05/15/2023   BILITOT 1.7 (H) 06/08/2023   BILITOT 0.7 05/15/2023   BILITOT 0.5 06/22/2011   ALKPHOS 87 06/08/2023   ALKPHOS 41 06/22/2011   AST 17 06/08/2023   AST 22 06/22/2011   ALT 26 06/08/2023   Total Discharge time is about 33 minutes  Shon Hale M.D on 06/11/2023 at 10:42 AM  Go to [email or web address] -  for contact info  [facility] - Office  [phone]

## 2023-06-11 NOTE — ED Provider Notes (Signed)
[facility]    CSN: [id number] Arrival date & time: 06/08/23  1344      History   Chief Complaint Chief Complaint  Patient presents with   Dizziness    Dizzy. Nauseous. Achy. Coughing. Snotty. Possible fever. I think I have pneumonia. - Entered by patient    HPI [name] is a 67 y.o. male.   Patient presenting today with his wife for evaluation of some ongoing issues with congestion, chest pain, shortness of breath, cough, nausea.  Has been on a course of oral antibiotics for the past week with no relief.  States he is on continuous O2 around 3 L at all times but has been short of breath despite this at this time.  History of asthma, COPD, pneumonia, chronic respiratory failure.  Denies fever, dizziness, mental status changes, vomiting, diarrhea.  Using typical inhaler and nebulizer regimen with minimal relief.    Past Medical History:  Diagnosis Date   Anxiety    Arthritis    Asthma    Chest pain    a. 2003 Cath: nl cors.   Concussion    COPD (chronic obstructive pulmonary disease) ([name])    chronically on 3-4L [place]   Depression    Diabetes mellitus    Dyslipidemia    Tachycardia   Dyspnea    Dysrhythmia    Fracture 06/2014   lower back "L-1"   GERD (gastroesophageal reflux disease)    Hypertension    NASH (nonalcoholic steatohepatitis) 2018   [name], undergoing routine cirrhosis care; [name]   Pneumonia 12/01/2021   PONV (postoperative nausea and vomiting)    Splenomegaly    Thrombocytopenia ([name])     Patient Active Problem List   Diagnosis Date Noted   CAP (community acquired pneumonia) 06/08/2023   AKI (acute kidney injury) ([name]) 06/08/2023   Chronic maxillary sinusitis 04/15/2023   Chronic ethmoidal sinusitis 04/15/2023   Former cigarette smoker 09/06/2022   S/P functional endoscopic sinus surgery 03/02/2022   Hemorrhoids 07/20/2021   Rectal bleeding 07/20/2021   DOE (dyspnea on  exertion) 06/06/2021   Dysphagia 10/12/2020   Acute on chronic respiratory failure ([name]) 09/03/2020   Septic shock ([name]) 08/18/2020   Lobar pneumonia ([name]) 05/25/2020   Community acquired pneumonia of left lower lobe of lung 05/23/2020   Acute on chronic respiratory failure with hypoxia ([name]) 05/23/2020   Chronic respiratory failure with hypoxia ([name]) 02/04/2020   Upper airway cough syndrome 12/10/2019   Hives 07/30/2018   Full body hives 07/29/2018   Idiopathic angioedema 07/29/2018   COPD GOLD III 07/29/2018   Mycobacterium avium infection ([name]) 12/18/2017   Goiter 10/25/2017   Suspected Mycobacterial infectious disease 10/25/2017   COPD with acute exacerbation ([name]) 10/19/2017   Dehydration 10/19/2017   NASH (nonalcoholic steatohepatitis) 08/05/2017   Liver fibrosis 01/31/2017   H/O adenomatous polyp of colon 07/05/2016   Hyperproteinemia 09/05/2014   HTN (hypertension) 10/14/2013   Dyslipidemia 10/14/2013   Type 2 diabetes mellitus without complication, without long-term current use of insulin ([name]) 10/14/2013   Leukopenia 08/08/2013   Thrombocytopenia ([name]) 08/08/2013   Lower abdominal pain 07/06/2013   Abnormal LFTs 07/06/2013   Gastroesophageal reflux disease 07/06/2013   Constipation 07/06/2013   Abdominal bloating 04/21/2013   Abdominal pain, generalized 04/21/2013   Non-alcoholic fatty liver disease 04/21/2013   Splenomegaly 04/21/2013   Lumbago 11/26/2011   Muscle weakness (generalized) 11/26/2011   Bilateral leg pain 11/06/2011   Back pain  with radiation 11/06/2011   DDD (degenerative disc disease), lumbar 11/06/2011    Past Surgical History:  Procedure Laterality Date   BIOPSY  07/26/2016   Procedure: BIOPSY;  Surgeon: [name], MD;  [facility];  Service: Endoscopy;;  gastric   CARDIAC CATHETERIZATION  10/22/2001   normal coronary arteriea (Dr. Aram Candela)   COLONOSCOPY  08/31/2011   JOA:[id number] rectal and colon polyps-treated. Single  tubular adenoma. Next TCS 08/2016   COLONOSCOPY N/A 07/26/2016   Dr. [name]: diverticulosis in sigmoid and descending colon. 6 mm benign splenic flexure. surveillance 5 yeras   COLONOSCOPY WITH PROPOFOL N/A 08/07/2021   Surgeon: [name], DO;  Nonbleeding internal hemorrhoids, three 3-6 mm polyps in the transverse colon resected and retrieved, otherwise normal exam.  Pathology with 1 tubular adenoma, otherwise benign colonic mucosa.  Recommended 5-year repeat.   ESOPHAGOGASTRODUODENOSCOPY N/A 07/26/2016   Dr. [name]: normal esophagus, portal gastropathy, chronic gastritis, normal duodenum, screening EGD 2 years    ESOPHAGOGASTRODUODENOSCOPY (EGD) WITH ESOPHAGEAL DILATION N/A 05/15/2013   ZSW:[id number] esophagus-status post Elease Hashimoto dilation/Portal gastropathy. Antral erosions-status post biopsy. extrinsic compression along lesser curvature likely secondary to splenomegaly. gastric bx benign   ESOPHAGOGASTRODUODENOSCOPY (EGD) WITH PROPOFOL N/A 12/12/2020   Surgeon: [name], MD; ormal esophagus s/p dilation, portal hypertensive gastropathy.  Due for repeat in 2024.   ESOPHAGOGASTRODUODENOSCOPY (EGD) WITH PROPOFOL N/A 05/22/2023   Procedure: ESOPHAGOGASTRODUODENOSCOPY (EGD) WITH PROPOFOL;  Surgeon: [name], MD;  [facility];  Service: Endoscopy;  Laterality: N/A;  9:30 am, asa 3   ETHMOIDECTOMY Bilateral 03/02/2022   Procedure: ETHMOIDECTOMY;  Surgeon: [name], MD;  Location[facility] OR;  Service: ENT;  Laterality: Bilateral;   HERNIA REPAIR  03/2009   MALONEY DILATION N/A 12/12/2020   Procedure: Elease Hashimoto DILATION;  Surgeon: [name], MD;  [facility];  Service: Endoscopy;  Laterality: N/A;   MAXILLARY ANTROSTOMY Bilateral 03/02/2022   Procedure: MAXILLARY ANTROSTOMY;  Surgeon: [name], MD;  Location: [facility] OR;  Service: ENT;  Laterality: Bilateral;   NASAL SEPTOPLASTY W/ TURBINOPLASTY Bilateral 03/02/2022   Procedure: NASAL SEPTOPLASTY WITH TURBINATE  REDUCTION;  Surgeon: [name], MD;  Location: [facility] OR;  Service: ENT;  Laterality: Bilateral;   POLYPECTOMY  07/26/2016   Procedure: POLYPECTOMY;  Surgeon: [name], MD;  [facility];  Service: Endoscopy;;  colon   POLYPECTOMY  08/07/2021   Procedure: POLYPECTOMY;  Surgeon: [name], DO;  [facility];  Service: Endoscopy;;   SINUS ENDO WITH FUSION N/A 03/02/2022   Procedure: SINUS ENDO WITH FUSION;  Surgeon: [name], MD;  Location: [facility] OR;  Service: ENT;  Laterality: N/A;   TRANSTHORACIC ECHOCARDIOGRAM  2011   EF 50-55%, stage 1 diastolic dysfunction, trace TR & pulm valve regurg    VASECTOMY  1989       Home Medications    Prior to Admission medications   Medication Sig Start Date End Date Taking? Authorizing Provider  acetaminophen (TYLENOL) 325 MG tablet Take 2 tablets (650 mg total) by mouth every 6 (six) hours as needed for mild pain (pain score 1-3) (or Fever >/= 101). 06/11/23   Shon Hale, MD  albuterol (PROVENTIL) (2.5 MG/3ML) 0.083% nebulizer solution Take 3 mLs (2.5 mg total) by nebulization every 2 (two) hours as needed for wheezing or shortness of breath (cough). 06/11/23   Shon Hale, MD  albuterol (VENTOLIN HFA) 108 (90 Base) MCG/ACT inhaler Inhale 2 puffs into the lungs  every 4 (four) hours as needed for wheezing or shortness of breath. 06/11/23   Shon Hale, MD  ALPRAZolam Prudy Feeler) 0.25 MG tablet Take 0.25 mg by mouth 2 (two) times daily as needed for sleep or anxiety.    [provider]  atorvastatin (LIPITOR) 40 MG tablet Take 40 mg by mouth in the morning.    [provider]  azithromycin (ZITHROMAX) 500 MG tablet Take 1 tablet (500 mg total) by mouth daily for 3 days. 06/11/23 06/14/23  Shon Hale, MD  BREZTRI AEROSPHERE 160-9-4.8 MCG/ACT AERO INHALE 2 PUFFS INTO THE LUNGS TWICE A DAY 05/10/23   Nyoka Cowden, MD  cefdinir (OMNICEF) 300 MG capsule Take 1 capsule (300 mg total) by mouth 2 (two)  times daily for 5 days. 06/11/23 06/16/23  Shon Hale, MD  Cholecalciferol (VITAMIN D3) 25 MCG (1000 UT) CAPS Take 1,000 Units by mouth in the morning.    [provider]  diltiazem (CARDIZEM CD) 240 MG 24 hr capsule Take 240 mg by mouth in the morning. 01/12/21   [provider]  famotidine (PEPCID) 20 MG tablet Take 1 tablet (20 mg total) by mouth 2 (two) times daily. 06/11/23   Shon Hale, MD  ferrous sulfate 325 (65 FE) MG tablet Take 325 mg by mouth in the morning.    [provider]  fluticasone (FLONASE) 50 MCG/ACT nasal spray Place 1 spray into both nostrils daily as needed for allergies or rhinitis.    [provider]  furosemide (LASIX) 20 MG tablet Take 1 tablet (20 mg total) by mouth daily. 05/16/23 05/15/24  Letta Median, PA-C  gabapentin (NEURONTIN) 300 MG capsule Take 1 capsule (300 mg total) by mouth at bedtime. 06/13/20   Shon Hale, MD  glimepiride (AMARYL) 1 MG tablet Take 1 mg by mouth daily with breakfast.    [provider]  guaiFENesin (MUCINEX) 600 MG 12 hr tablet Take 1 tablet (600 mg total) by mouth 2 (two) times daily. 06/11/23 06/10/24  Shon Hale, MD  losartan (COZAAR) 25 MG tablet Take 25 mg by mouth daily. 01/18/23   [provider]  metFORMIN (GLUCOPHAGE) 1000 MG tablet Take 1,000 mg by mouth daily with supper. 06/20/21   [provider]  Multiple Vitamin (MULTIVITAMIN WITH MINERALS) TABS tablet Take 1 tablet by mouth at bedtime. 06/13/20   Shon Hale, MD  OXYGEN Inhale 3 L into the lungs continuous.    [provider]  pantoprazole (PROTONIX) 40 MG tablet Take 1 tablet (40 mg total) by mouth in the morning. 05/08/23   Letta Median, PA-C  pioglitazone (ACTOS) 15 MG tablet Take 15 mg by mouth daily. 07/01/22   [provider]  predniSONE (DELTASONE) 20 MG tablet Take 1 tablet (20 mg total) by mouth daily with breakfast for 5 days. 06/11/23 06/16/23  Shon Hale, MD  pyridOXINE (B-6) 50 MG tablet Take 50 mg by mouth daily.    [provider]  sertraline (ZOLOFT) 50 MG tablet Take 1 tablet (50 mg total) by mouth daily. Patient taking differently: Take 50 mg by mouth in the morning. 06/13/20   Shon Hale, MD  spironolactone (ALDACTONE) 25 MG tablet Take 1 tablet (25 mg total) by mouth daily. 05/16/23 05/15/24  Letta Median, PA-C  tamsulosin (FLOMAX) 0.4 MG CAPS capsule Take 0.4 mg by mouth in the morning.    [provider]  vitamin C (ASCORBIC ACID) 250 MG tablet Take 250 mg by mouth in the morning.  [provider]  Wheat Dextrin (BENEFIBER PO) Take 1 Capful by mouth daily.    [provider]    Family History Family History  Problem Relation Age of Onset   Heart disease Mother    Cancer Mother        lymph nodes   Diabetes Mother    Arthritis Other    Lung disease Other    Asthma Other    Kidney disease Other    Ovarian cancer Sister    Diabetes Brother    Heart disease Brother    Hypertension Brother    Diabetes Sister    Diabetes Sister    Cirrhosis Sister 42       no etoh   Colon cancer Neg Hx     Social History Social History   Tobacco Use   Smoking status: Former    Current packs/day: 0.00    Average packs/day: 1.3 packs/day for 36.0 years (45.0 ttl pk-yrs)    Types: Cigarettes    Start date: 04/1975    Quit date: 04/2011    Years since quitting: 12.1   Smokeless tobacco: Never  Vaping Use   Vaping status: Never Used  Substance Use Topics   Alcohol use: No   Drug use: No     Allergies   Lioresal [baclofen] and Zocor [simvastatin]   Review of Systems Review of Systems Per HPI  Physical Exam Triage Vital Signs ED Triage Vitals [06/08/23 1425]  Encounter Vitals Group     BP (!) 81/52     Systolic BP Percentile      Diastolic BP Percentile      Pulse Rate 100     Resp (!) 26     Temp (!) 97.3 F (36.3 C)     Temp Source Temporal     SpO2 91 %      Weight      Height      Head Circumference      Peak Flow      Pain Score      Pain Loc      Pain Education      Exclude from Growth Chart    No data found.  Updated Vital Signs BP (!) 81/52 (BP Location: Right Arm)   Pulse 100   Temp (!) 97.3 F (36.3 C) (Temporal)   Resp (!) 26   SpO2 91%   Visual Acuity Right Eye Distance:   Left Eye Distance:   Bilateral Distance:    Right Eye Near:   Left Eye Near:    Bilateral Near:      Physical exam significantly abbreviated today as the decision was already made to go to the emergency department. Physical Exam Vitals and nursing note reviewed.  Constitutional:      Appearance: He is well-developed. He is ill-appearing.  HENT:     Head: Atraumatic.  Eyes:     Conjunctiva/sclera: Conjunctivae normal.  Cardiovascular:     Rate and Rhythm: Regular rhythm. Tachycardia present.  Pulmonary:     Effort: Pulmonary effort is normal.     Comments: On 3 L via nasal cannula Musculoskeletal:        General: No signs of injury.     Cervical back: Normal range of motion.  Skin:    General: Skin is warm and dry.  Neurological:     Mental Status: He is oriented to person, place, and time.  Psychiatric:        Behavior: Behavior normal.  UC Treatments / Results  Labs (all labs ordered are listed, but only abnormal results are displayed) Labs Reviewed - No data to display  EKG   Radiology No results found.  Procedures Procedures (including critical care time)  Medications Ordered in UC Medications - No data to display  Initial Impression / Assessment and Plan / UC Course  I have reviewed the triage vital signs and the nursing notes.  Pertinent labs & imaging results that were available during my care of the patient were reviewed by me and considered in my medical decision making (see chart for details).     In triage, patient was hypotensive, tachypneic, hypoxic despite 3 L of continuous O2 via nasal cannula in  place.  Oxygen saturations on O2 ranging from 87% to 91% while in clinic.  Discussed given unstable vital signs, he needed further evaluation in the emergency department.  Recommended EMS transport given this instability.  His significant other who is with him today wishes to drive him via private vehicle which he prefers.  Declines EMS transport.  Final Clinical Impressions(s) / UC Diagnoses   Final diagnoses:  Acute respiratory failure with hypoxia ([name])  Hypotension, unspecified hypotension type  Tachycardia  Chronic obstructive pulmonary disease, unspecified COPD type [facility])     Discharge Instructions      Go directly to the emergency department for further evaluation    ED Prescriptions   None    PDMP not reviewed this encounter.   Particia Nearing, [place] 06/11/23 [phone]

## 2023-06-12 ENCOUNTER — Telehealth: Payer: Self-pay

## 2023-06-12 NOTE — Transitions of Care (Post Inpatient/ED Visit) (Signed)
   06/12/2023  Name: [name] MRN: [id number] DOB: 1955-09-06  Today's TOC FU Call Status: Today's TOC FU Call Status:: Unsuccessful Call (1st Attempt) Unsuccessful Call (1st Attempt) Date: 06/12/23  Attempted to reach the patient regarding the most recent Inpatient/ED visit.  Follow Up Plan: Additional outreach attempts will be made to reach the patient to complete the Transitions of Care (Post Inpatient/ED visit) call.   Susa Loffler , BSN, RN Care Management Coordinator [place]   [facility] [email or web address] Direct Dial: [phone]

## 2023-06-13 ENCOUNTER — Telehealth: Payer: Self-pay

## 2023-06-13 LAB — CULTURE, BLOOD (ROUTINE X 2)

## 2023-06-13 NOTE — Transitions of Care (Post Inpatient/ED Visit) (Signed)
06/13/2023  Name: [name] MRN: [id number] DOB: [birth year]  Today's TOC FU Call Status: Today's TOC FU Call Status:: Successful TOC FU Call Completed TOC FU Call Complete Date: 06/13/23 Patient's Name and Date of Birth confirmed.  Transition Care Management Follow-up Telephone Call How have you been since you were released from the hospital?: Better (reports that he is feeling better. Reports wearing oxygen.) Any questions or concerns?: No  Items Reviewed: Did you receive and understand the discharge instructions provided?: Yes Medications obtained,verified, and reconciled?: Yes (Medications Reviewed) Any new allergies since your discharge?: No Dietary orders reviewed?: Yes Type of Diet Ordered:: low salt heart healthy diet, carb modified. Do you have support at home?: Yes People in Home: spouse Name of Support/Comfort Primary Source: Dois Davenport  Medications Reviewed Today: Medications Reviewed Today     Reviewed by Earlie Server, RN (Registered Nurse) on 06/13/23 at 1204  Med List Status: <None>   Medication Order Taking? Sig Documenting Provider Last Dose Status Informant  acetaminophen (TYLENOL) 325 MG tablet [id number] Yes Take 2 tablets (650 mg total) by mouth every 6 (six) hours as needed for mild pain (pain score 1-3) (or Fever >/= 101). Shon Hale, MD Taking Active   albuterol (PROVENTIL) (2.5 MG/3ML) 0.083% nebulizer solution [id number] Yes Take 3 mLs (2.5 mg total) by nebulization every 2 (two) hours as needed for wheezing or shortness of breath (cough). Shon Hale, MD Taking Active   albuterol (VENTOLIN HFA) 108 (90 Base) MCG/ACT inhaler [id number] Yes Inhale 2 puffs into the lungs every 4 (four) hours as needed for wheezing or shortness of breath. Shon Hale, MD Taking Active   ALPRAZolam Prudy Feeler) 0.25 MG tablet [id number] Yes Take 0.25 mg by mouth 2 (two) times daily as needed for sleep or anxiety. [provider] Taking Active Self,  Spouse/Significant Other  atorvastatin (LIPITOR) 40 MG tablet [id number] Yes Take 40 mg by mouth in the morning. [provider] Taking Active Self, Spouse/Significant Other  azithromycin (ZITHROMAX) 500 MG tablet [id number] Yes Take 1 tablet (500 mg total) by mouth daily for 3 days. Shon Hale, MD Taking Active   BREZTRI AEROSPHERE 160-9-4.8 MCG/ACT AERO [id number] Yes INHALE 2 PUFFS INTO THE LUNGS TWICE A DAY Nyoka Cowden, MD Taking Active Self, Spouse/Significant Other  cefdinir (OMNICEF) 300 MG capsule [id number] Yes Take 1 capsule (300 mg total) by mouth 2 (two) times daily for 5 days. Shon Hale, MD Taking Active   Cholecalciferol (VITAMIN D3) 25 MCG (1000 UT) CAPS [id number] Yes Take 1,000 Units by mouth in the morning. [provider] Taking Active Self, Spouse/Significant Other  diltiazem (CARDIZEM CD) 240 MG 24 hr capsule [id number] Yes Take 240 mg by mouth in the morning. [provider] Taking Active Self, Spouse/Significant Other  famotidine (PEPCID) 20 MG tablet [id number] Yes Take 1 tablet (20 mg total) by mouth 2 (two) times daily. Shon Hale, MD Taking Active   ferrous sulfate 325 (65 FE) MG tablet [id number] Yes Take 325 mg by mouth in the morning. [provider] Taking Active Self, Spouse/Significant Other  fluticasone (FLONASE) 50 MCG/ACT nasal spray [id number] Yes Place 1 spray into both nostrils daily as needed for allergies or rhinitis. [provider] Taking Active Self, Spouse/Significant Other  furosemide (LASIX) 20 MG tablet [id number] Yes Take 1 tablet (20 mg total) by mouth daily. Letta Median, PA-C Taking Active Self, Spouse/Significant Other  gabapentin (NEURONTIN) 300 MG capsule [id number] Yes Take 1 capsule (300 mg total) by mouth at bedtime. Emokpae,  Courage, MD Taking Active Self, Spouse/Significant Other  glimepiride (AMARYL) 1 MG tablet [id number] Yes Take 1 mg by mouth daily with breakfast. [provider] Taking Active Self, Spouse/Significant Other  guaiFENesin (MUCINEX) 600 MG 12 hr tablet [id number] Yes Take 1 tablet (600 mg total) by mouth 2 (two) times daily. Shon Hale, MD Taking Active   losartan (COZAAR) 25 MG tablet [id number] Yes Take 25 mg by mouth daily. [provider] Taking Active Self, Spouse/Significant Other  metFORMIN (GLUCOPHAGE) 1000 MG tablet [id number] Yes Take 1,000 mg by mouth daily with supper. [provider] Taking Active Self, Spouse/Significant Other  Multiple Vitamin (MULTIVITAMIN WITH MINERALS) TABS tablet [id number] Yes Take 1 tablet by mouth at bedtime. Shon Hale, MD Taking Active Self, Spouse/Significant Other  OXYGEN [id number] Yes Inhale 3 L into the lungs continuous. [provider] Taking Active Self, Spouse/Significant Other           Med Note [facility], [facility]   Mon Jun 13, 2020 12:00 PM)    pantoprazole (PROTONIX) 40 MG tablet [id number] Yes Take 1 tablet (40 mg total) by mouth in the morning. Letta Median, PA-C Taking Active Self, Spouse/Significant Other  pioglitazone (ACTOS) 15 MG tablet [id number] Yes Take 15 mg by mouth daily. [provider] Taking Active Self, Spouse/Significant Other  predniSONE (DELTASONE) 20 MG tablet [id number] Yes Take 1 tablet (20 mg total) by mouth daily with breakfast for 5 days. Shon Hale, MD Taking Active   pyridOXINE (B-6) 50 MG tablet [id number] Yes Take 50 mg by mouth daily. [provider] Taking Active Self, Spouse/Significant Other  sertraline (ZOLOFT) 50 MG tablet [id number] Yes Take 1 tablet (50 mg total) by mouth daily.  Patient taking differently: Take 50 mg by mouth in the morning.   Shon Hale, MD Taking Active Self, Spouse/Significant Other  spironolactone (ALDACTONE) 25 MG tablet [id number] Yes Take 1 tablet (25 mg total) by mouth daily. Letta Median, PA-C Taking Active Self, Spouse/Significant Other  tamsulosin (FLOMAX) 0.4 MG  CAPS capsule [id number] Yes Take 0.4 mg by mouth in the morning. [provider] Taking Active Self, Spouse/Significant Other  vitamin C (ASCORBIC ACID) 250 MG tablet [id number] Yes Take 250 mg by mouth in the morning. [provider] Taking Active Self, Spouse/Significant Other  Wheat Dextrin (BENEFIBER PO) [id number] Yes Take 1 Capful by mouth daily. [provider] Taking Active Self, Spouse/Significant Other            Home Care and Equipment/Supplies: Were Home Health Services Ordered?: No Any new equipment or medical supplies ordered?: No  Functional Questionnaire: Do you need assistance with bathing/showering or dressing?: No Do you need assistance with meal preparation?: No Do you need assistance with eating?: No Do you have difficulty maintaining continence: No Do you need assistance with getting out of bed/getting out of a chair/moving?: No Do you have difficulty managing or taking your medications?: No  Follow up appointments reviewed: PCP Follow-up appointment confirmed?: No (Patient will call MD today to  make an appointment) MD Provider Line Number:[phone] Given: No Specialist Hospital Follow-up appointment confirmed?: No Do you need transportation to your follow-up appointment?: No Do you understand care options if your condition(s) worsen?: Yes-patient verbalized understanding  SDOH Interventions Today    Flowsheet Row Most Recent Value  SDOH Interventions   Food Insecurity Interventions Intervention Not Indicated  Housing Interventions Intervention Not Indicated  Transportation Interventions Intervention Not Indicated  Utilities Interventions Intervention Not Indicated      Patient reports  that he is doing well. Reports that he will call his PCP and make his own appointment. Patient verbalizes understanding of his medications and is taking them as prescribed.    Patient reports that he is not smoking and has not smoked for 10  years.  Patient interested in portable oxygen and has discussed this with Dr. Sherene Sires. Patient will call back to Dr. Rolin Barry office to inquire about next steps.  Offered patient 30 day TOC program and patient declined.   Provided my contact information and patient will call me if needed in the future.  Lonia Chimera, RN, BSN, CEN [employer]- Transition of Care Team.  [facility] [phone]

## 2023-06-14 NOTE — Progress Notes (Signed)
Patient declined TOC program 06/13/23 during [name] call  Closing referral   [facility] Coordination Care Guide  Direct Dial: [phone]

## 2023-06-15 ENCOUNTER — Other Ambulatory Visit: Payer: Self-pay | Admitting: Gastroenterology

## 2023-06-15 DIAGNOSIS — K7400 Hepatic fibrosis, unspecified: Secondary | ICD-10-CM

## 2023-06-15 DIAGNOSIS — R600 Localized edema: Secondary | ICD-10-CM

## 2023-06-15 DIAGNOSIS — K7581 Nonalcoholic steatohepatitis (NASH): Secondary | ICD-10-CM

## 2023-06-18 DIAGNOSIS — Z23 Encounter for immunization: Secondary | ICD-10-CM | POA: Diagnosis not present

## 2023-06-18 DIAGNOSIS — J189 Pneumonia, unspecified organism: Secondary | ICD-10-CM | POA: Diagnosis not present

## 2023-06-18 DIAGNOSIS — J9601 Acute respiratory failure with hypoxia: Secondary | ICD-10-CM | POA: Diagnosis not present

## 2023-07-02 ENCOUNTER — Ambulatory Visit (HOSPITAL_COMMUNITY)
Admission: RE | Admit: 2023-07-02 | Discharge: 2023-07-02 | Disposition: A | Discharge status: Home / Self Care | Payer: PPO | Source: Ambulatory Visit | Attending: Acute Care | Admitting: Acute Care

## 2023-07-02 DIAGNOSIS — R911 Solitary pulmonary nodule: Secondary | ICD-10-CM | POA: Insufficient documentation

## 2023-07-02 DIAGNOSIS — F17210 Nicotine dependence, cigarettes, uncomplicated: Secondary | ICD-10-CM | POA: Diagnosis not present

## 2023-07-04 DIAGNOSIS — E1165 Type 2 diabetes mellitus with hyperglycemia: Secondary | ICD-10-CM | POA: Diagnosis not present

## 2023-07-04 DIAGNOSIS — Z515 Encounter for palliative care: Secondary | ICD-10-CM | POA: Diagnosis not present

## 2023-07-16 ENCOUNTER — Telehealth: Payer: Self-pay | Admitting: Acute Care

## 2023-07-16 NOTE — Telephone Encounter (Signed)
 Spoke with Tiffany at [facility] radiology regarding call report:   IMPRESSION: Lung-RADS 4B, suspicious. Waxing and waning of nodular consolidative opacities in both lungs over multiple prior lung cancer screening chest CTs. Likely infectious/inflammatory, additional imaging evaluation or consultation with Pulmonology or Thoracic Surgery recommended. Ultimately, this patient may not be a good candidate for lung cancer screening.   Prominence of the pulmonary arteries suggests pulmonary arterial hypertension.   Emphysema (ICD10-J43.9) and Aortic Atherosclerosis (ICD10-170.0)

## 2023-07-16 NOTE — Telephone Encounter (Signed)
 Tiffany calling with call report. For CT scan.Tiffany phone number is [phone].

## 2023-07-18 ENCOUNTER — Telehealth: Payer: Self-pay | Admitting: Acute Care

## 2023-07-18 NOTE — Telephone Encounter (Signed)
 See telephone note from 07/18/23

## 2023-07-18 NOTE — Telephone Encounter (Signed)
 I have attempted to call the patient with the results of their  Low Dose CT Chest Lung cancer screening scan. There was no answer. I have left a HIPPA compliant VM requesting the patient call the office for the scan results. I included the office contact information in the message. We will await his return call. If no return call we will continue to call until patient is contacted.    Ladies, we need to get him scheduled with Dr. Darlean in [place] to treat the infectious inflammatory findings and then rescan after treatment. He has waxing and waning nodules that we need to get cleared up so we can get a clear picture of his lungs. They have been read as a 4 B, but he has had progression of infection , so needs treating.   Fax results to PCP and let them know plan is for follow up with Dr. Darlean to treat findings and then rescan by screening program to re-evaluate. Thanks so much

## 2023-07-22 ENCOUNTER — Other Ambulatory Visit: Payer: Self-pay

## 2023-07-22 NOTE — Telephone Encounter (Signed)
 Attempted to reach patient. No answer. LVM requesting call back to review results.

## 2023-07-22 NOTE — Telephone Encounter (Signed)
 Patient returned call. Informed him of the results and recommendations per Lauraine Lites NP. Patient verbalized understanding. Patient has been scheduled to see Dr. Darlean in [place] on 1/17 for treatment. Will hold message to order repeat scan after treatment.   Will forward to Dr. Darlean as RICK.

## 2023-07-24 ENCOUNTER — Telehealth: Payer: Self-pay | Admitting: Acute Care

## 2023-07-24 DIAGNOSIS — R911 Solitary pulmonary nodule: Secondary | ICD-10-CM

## 2023-07-24 DIAGNOSIS — J9621 Acute and chronic respiratory failure with hypoxia: Secondary | ICD-10-CM

## 2023-07-24 DIAGNOSIS — R0609 Other forms of dyspnea: Secondary | ICD-10-CM

## 2023-07-24 NOTE — Telephone Encounter (Signed)
 I have attempted to call the patient with the results of their  Low Dose CT Chest Lung cancer screening scan. There was no answer. I have left a HIPPA compliant VM requesting the patient call the office for the scan results. I included the office contact information in the message. We will await his return call. If no return call we will continue to call until patient is contacted.    This patient has waxing and waning nodules. We need to sent him a letter to have him  call the office so we can review the results of the scan. We need to see if he has been sick.Thanks  OK to fax results to PCP. Let them know we are trying to get in touch with patient  to go over the results. Most likely plan will be for a 3 month follow up to re-evaluate, but need to know if he has been sick. Thanks  .

## 2023-07-25 NOTE — Progress Notes (Signed)
[name], male    DOB: [birth year]     MRN: [id number]   Brief patient profile:  61 yowm MM/quit smoking 2012 with COPD GOLD 3 criteria 06/17/20 previously followed by Dr Juanetta Gosling referred back to pulmonary clinic 12/09/2019 in [place].   History of Present Illness  12/09/2019  Pulmonary/ 1st office eval/[name]  symbicort 160/spiriva  Chief Complaint  Patient presents with   Pulmonary Consult    Former patient of Dr Juanetta Gosling.  Breathing has been worse over the past few wks. Recently treated with Doxy per PCP for COPD exacerbation. He is occ coughing up some green sputum.   flare 11/23/19  rx doxy 5/19 x 7 days  Dyspnea:  Baseline uses [employer] parking / MMR[employer]3 = can't walk 100 yards even at a slow pace at a flat grade s stopping due to sob  On 2lpm  Cough: improved less purulent purulent  Sleep: wakes up coughing since onset /sometimes uses saba proair  SABA use: rarely daytime rec Continue protonix 40 mg Take 30-60 min before first meal of the day  GERD diet  Plan A = Automatic = Always=    Budesonide/formoterol (symbicort ) 2 puffs first thing in am and 12 hours later and use spiriva x 2 pffs after the morning symbicort (ok to change back to the powder after 2 weeks but will need to stop it if you start coughing  Work on inhaler technique  Plan B = Backup (to supplement plan A, not to replace it) Only use your albuterol inhaler as a rescue medication    Plan C = Crisis (instead of Plan B   Make sure you check your oxygen saturations at highest level of activity to be sure it stays over 90%   Prednisone 10 mg take  4 each am x 2 days,   2 each am x 2 days,  1 each am x 2 days and stop  For cough > mucinex dm 1200 mg every 12 hours as needed  Would wait another 2 weeks before you get your first shot for covid 19    [name] eval 08/01/20 rec ST eval done 08/05/20: low risk asp SLP Diet Recommendations Regular solids;Thin liquid  Liquid Administration via Straw;Cup  Medication Administration  Whole meds with liquid  Compensations Slow rate;Small sips/bites  Postural Changes Seated upright at 90 degrees;Remain semi-upright after after feeds/meals (Comment)     [name] 03/02/22 FESS   04/05/2022  f/u ov/[place] office/[name] re: GOLD 3 copd maint on symb/spiriva = 240 dollars Chief Complaint  Patient presents with   Follow-up    Saw Dr. Suszanne [name] and had surgery and is doing much better with his breathing   Dyspnea:  mailbox and back using 02 sometimes/  Cough: overall better since sinus surgery  Sleeping: noct better on bed blocks  SABA use: much less hfa/ never neb  02: 3lpm hs / sitting  and walks off it rec Plan A = Automatic = Always=    Breztri Take 2 puffs first thing in am and then another 2 puffs about 12 hours later.   Plan B = Backup (to supplement plan A, not to replace it) Only use your albuterol inhaler as a rescue medication  Plan C = Crisis (instead of Plan B but only if Plan B stops working) - only use your albuterol nebulizer if you first try Plan B and it fails to help Prednisone 10 mg take  4 each am x 2 days,   2  each am x 2 days,  1 each am x 2 days and stop  For cough mucinex dm 1200 mg every 12 hours as needed  Pantoprazole (protonix) 40 mg   Take  30-60 min before first meal of the day and Pepcid (famotidine)  20 mg (OTC) after supper until return to office GERD diet reviewed, bed blocks rec  If cough not better add another gabapentin 300 mg in am   Make sure you check your oxygen saturation  AT  your highest level of activity (not after you stop)   to be sure it stays over 90%       03/07/2023  f/u ov/[place] office/[name] re: GOLD 3 / 02 hs and prn  MPNS  maint on breztri  / not taking pepcid any more Chief Complaint  Patient presents with   COPD    GOLD III  Dyspnea:  mb x 300 ft flat can do s stopping  Cough: worse at hs and in am p stirring but no excess / purulent sputum Sleeping: bed blocks and pillows s resp cc  SABA use: hfa x once a week/ neb  hardly any  02: 3lpm at hs , prn daytime  Lung cancer screening: due 03/2023  Rec Add pepcid 20 mg an hour before bedtime (automatically) Make sure you check your oxygen saturation  AT  your highest level of activity (not after you stop)   to be sure it stays over 90%  Also  Ok to try albuterol 15 min before an activity (on alternating days)  that you know would usually make you short of breath      Had LDSCT in Sept 2024 "? During [facility] {"  05/01/2023  f/u ov/[place] office/[name] re: COPD GOLD 3/ 02 hs and prn  maint on Breztri   Chief Complaint  Patient presents with   COPD    Gold III  Dyspnea:  mb and back sometimes s 02  Cough:resolved p abx per Dr Ouida Sills  Sleeping:  bed blocks/ 3 pillows s resp cc    resp cc  SABA use: 2 x this  02: 3lpm at hs  Rec For cough/ congestion > mucinex or mucinex dm up to maximum of  1200 mg every 12 hours as needed  We can order best fit for portable 02 thru [facility] Remember to make sure you check your oxygen saturation  AT  your highest level of activity (not after you stop)   to be sure it stays over 90%    Admission date:  06/08/2023     Discharge Date:  06/11/2023    Discharge Diagnosis  Acute on chronic respiratory failure with hypoxia ([name]) [J96.21] Pneumonia due to infectious organism, unspecified laterality, unspecified part of lung [J18.9]     Principal Problem:   Acute on chronic respiratory failure with hypoxia ([name])   Gastroesophageal reflux disease   HTN (hypertension)   Type 2 diabetes mellitus without complication, without long-term current use of insulin ([name])   COPD with acute exacerbation ([name])   CAP (community acquired pneumonia)   AKI (acute kidney injury) ([name])     Never got back to baseline since d/c 06/11/23 in terms of sob/cough   07/26/2023  f/u ov/[place] office/[name] re: GOLD 3 / 02 HS  maint on breztri   Chief Complaint  Patient presents with   Follow-up  Worse since finished prednisone  x around a week prior  to OV  more congested / green mucus  ? Best rx  was levaquin per wife  Dyspnea:  room to room since Jul 12 2023 mostly tired /some presyncope as well standing / eating and drinking ok  Cough:worse at hs and off and on all night  Sleeping: bed is flat with bunch of pillows    SABA use: last used neb> 12 h prior to OV  and no hfa on day of ov either 02: 3lpm 24/7  sometimes 4lpm but not really titrating to sats    No obvious patterns in day to day or daytime variability or assoc  mucus plugs or hemoptysis or cp or chest tightness, subjective wheeze or overt sinus or hb symptoms.    Also denies any obvious fluctuation of symptoms with weather or environmental changes or other aggravating or alleviating factors except as outlined above   No unusual exposure hx or h/o childhood pna/ asthma or knowledge of premature birth.  Current Allergies, Complete Past Medical History, Past Surgical History, Family History, and Social History were reviewed in [employer] record.  ROS  The following are not active complaints unless bolded Hoarseness, sore throat, dysphagia, dental problems, itching, sneezing,  nasal congestion or discharge of excess mucus or purulent secretions, ear ache,   fever, chills, sweats, unintended wt loss or wt gain, classically pleuritic or exertional cp,  orthopnea pnd or arm/hand swelling  or leg swelling, presyncope, palpitations, abdominal pain, anorexia, nausea, vomiting, diarrhea  or change in bowel habits or change in bladder habits, change in stools or change in urine, dysuria, hematuria,  rash, arthralgias, visual complaints, headache, numbness, weakness or ataxia or problems with walking or coordination,  change in mood or  memory.          Past Medical History:  Diagnosis Date   Anxiety    Asthma    Chest pain    a. 2003 Cath: nl cors.   COPD (chronic obstructive pulmonary disease) ([name])    Depression    Diabetes mellitus    Dyslipidemia     Dysrhythmia    Fracture 12/15   lower back "L-1"   GERD (gastroesophageal reflux disease)    Hypertension    PONV (postoperative nausea and vomiting)    Splenomegaly        Objective:    Wt   07/26/2023        162  05/01/2023      170   03/07/2023        167   09/06/2022        163   04/05/2022        156 12/08/2021          164 09/01/2021        167  06/06/2021      162 12/08/2020          163 09/15/2020        154  05/23/2020      166 04/28/2020      166   12/30/19 162 lb (73.5 kg)  12/21/19 162 lb (73.5 kg)  12/09/19 162 lb (73.5 kg)   Vital signs reviewed  07/26/2023  - Note at rest 02 sats  93% on 3   General appearance:  frail chronic > acutely ill amb wm nad  HEENT :  Oropharynx  clear/ top dentures   Nasal turbinates nl    NECK :  without JVD/Nodes/TM/ nl carotid upstrokes bilaterally   LUNGS: no acc muscle use,  Mod barrel  contour chest wall with bilateral   insp/  exp rhonchi esp bases and  with cough on insp  maneuvers and mod  Hyperresonant  to  percussion bilaterally     CV:  RRR  no s3 or murmur or increase in P2, and no edema   ABD:  protuberant ? Ascites but soft and nontender    MS:   Ext warm without deformities or   obvious joint restrictions , calf tenderness, cyanosis or clubbing  SKIN: warm and dry without lesions    NEURO:  alert, approp, nl sensorium with  no motor or cerebellar deficits apparent.          CXR PA and Lateral:   07/26/2023 :    I personally reviewed images and impression is as follows:     Reticulo-nodular lung dz / copd no def as dz    I personally reviewed images and agree with radiology impression as follows:   Chest LDSCT     07/02/23  Centrilobular and paraseptal emphysema evident. Bronchial wall thickening with scattered areas of bronchiectasis again noted. Interval progression of collapse/consolidative opacity in the inferior right lower lobe along the minor fissure. Multiple new nodular areas of consolidative  opacity were seen on the previous screening CT. Some of these have decreased in the interval while others remain stable. New areas of nodular consolidative disease are seen in the left upper lobe today (6.3 mm on image 78 in 13.4 mm on image 101. Nodular consolidative opacities in the peripheral lung bases and lingula are similar to prior. No pleural effusion.             Assessment

## 2023-07-25 NOTE — Telephone Encounter (Signed)
Letter printed and sent to mychart to contact office and review results.

## 2023-07-26 ENCOUNTER — Encounter: Payer: Self-pay | Admitting: Internal Medicine

## 2023-07-26 ENCOUNTER — Ambulatory Visit (HOSPITAL_COMMUNITY)
Admission: RE | Admit: 2023-07-26 | Discharge: 2023-07-26 | Disposition: A | Discharge status: Home / Self Care | Payer: PPO | Source: Ambulatory Visit | Attending: Internal Medicine | Admitting: Internal Medicine

## 2023-07-26 ENCOUNTER — Ambulatory Visit (INDEPENDENT_AMBULATORY_CARE_PROVIDER_SITE_OTHER): Payer: PPO | Admitting: Internal Medicine

## 2023-07-26 VITALS — BP 80/52 | HR 62 | Temp 97.4°F | Ht 67.0 in | Wt 162.0 lb

## 2023-07-26 DIAGNOSIS — Z87891 Personal history of nicotine dependence: Secondary | ICD-10-CM

## 2023-07-26 DIAGNOSIS — R918 Other nonspecific abnormal finding of lung field: Secondary | ICD-10-CM | POA: Insufficient documentation

## 2023-07-26 DIAGNOSIS — R0609 Other forms of dyspnea: Secondary | ICD-10-CM | POA: Diagnosis not present

## 2023-07-26 DIAGNOSIS — I951 Orthostatic hypotension: Secondary | ICD-10-CM | POA: Diagnosis not present

## 2023-07-26 DIAGNOSIS — J9611 Chronic respiratory failure with hypoxia: Secondary | ICD-10-CM

## 2023-07-26 DIAGNOSIS — J449 Chronic obstructive pulmonary disease, unspecified: Secondary | ICD-10-CM

## 2023-07-26 DIAGNOSIS — R059 Cough, unspecified: Secondary | ICD-10-CM | POA: Diagnosis not present

## 2023-07-26 MED ORDER — PREDNISONE 10 MG PO TABS: ORAL_TABLET | ORAL | 0 refills | Status: DC

## 2023-07-26 MED ORDER — LEVOFLOXACIN 500 MG PO TABS
500.0000 mg | ORAL_TABLET | Freq: Every day | ORAL | 0 refills | Status: DC
Start: 2023-07-26 — End: 2023-08-08

## 2023-07-26 NOTE — Assessment & Plan Note (Addendum)
Quit smoking 2012 - alpha one AT screen  01/31/17  MM  Level 169 - PFT's  06/18/2019  FEV1 1.12 (35 % ) ratio 0.46  p 0 % improvement from saba p ? prior to study with DLCO  11.91 (48%) corrects to 3.18 (754%)  for alv volume and FV curve classic concavity   - [name] 03/02/22 FESS  - 03/07/2023  continue breztri - 07/26/2023  After extensive coaching inhaler device,  effectiveness =    75% (short Ti)   Acting now more like obst bronchiectasis than typical copd and need to consider changing over to symb 80/ spririva 2.5  to lower the ICS component but first try rx as follows:  Levaquin 500 mg daily x 10 days  - stop if have any pain ankle tendons   Prednisone 10 mg x 2 each am until better then  1 daily x 5 days and stop   For cough/ congestion > mucinex or mucinex dm  up to maximum of  1200 mg every 12 hours and use the flutter valve as much as you can    Regroup in 2 weeks - next step may be  collect mucus for opportunistic gnr and atypical TB

## 2023-07-26 NOTE — Assessment & Plan Note (Signed)
Started on 02 around 2019 As of 02/03/2020  2lpm 24/7  -  02/03/2020 referred to [facility] for Best fit for amb 02 > 3lpm pulsed tank -  04/28/2020   Walked 3lpm pulsed tank  approx   400 ft  @ moderate pace  stopped due to  Sob/tired  with sats still 95%    -  09/15/2020   Walked 3lpm cont approx   200 ft  @ slow/slt undsteady  pace  stopped due to  Sob/fatigue   With sats 91% at rest sats were 86% RA   12/08/2020   Walked RA  approx   500 ft  @ moderate pace  stopped due to  End of study, a little sob, sats still 90%  - 04/05/2022 patient walked at a moderate pace on room air all three laps @ 150 ft each. Desat to 87% at end of third lap. Patient placed back on 3LO2 cont at end of walk  - 09/06/2022   Walked on 2lpm cont   x  3  lap(s) =  approx 450  ft  @ moderate pace, stopped due to end of study  with lowest 02 sats 93%   - 03/07/2023   Walked on 3lpm   x  2  lap(s) =  approx 300  ft  @ nl  pace, stopped due to sob with lowest 02 sats 88%   -  05/01/2023   Walked on 2lpom con  x  3  lap(s) =  approx 450  ft  @ mod to slow pace, stopped due to sob  with lowest 02 sats 91%      Again rec: Make sure you check your oxygen saturation  AT  your highest level of activity (not after you stop)   to be sure it stays over 90% and adjust  02 flow upward to maintain this level if needed but remember to turn it back to previous settings when you stop (to conserve your supply).

## 2023-07-26 NOTE — Assessment & Plan Note (Signed)
Quit 2012 so eligible for LDSCT until 2027  - LDSCT 04/01/23 abn ? In setting of aecopd > done 07/02/23 c/w inflammatory changes wax and wane and not a good candidate for continued screening cts per radiology   Cancel continued scheduled scanning for now

## 2023-07-26 NOTE — Assessment & Plan Note (Addendum)
Noted 07/26/2023 ? Adrenal insuff   Check labs for anemia/ pre renal azotemia / hold ARB since on aldactone/ consider reduce cardizem.  Can't reduce/ d/c flomax as continues to have incont of urine ? Overflow > referred back to renal.  F/u 2 weeks with all meds in hand using a trust but verify approach to confirm accurate Medication  Reconciliation The principal here is that until we are certain that the  patients are doing what we've asked, it makes no sense to ask them to do more.   Each maintenance medication was reviewed in detail including emphasizing most importantly the difference between maintenance and prns and under what circumstances the prns are to be triggered using an action plan format where appropriate.  Total time for H and P, chart review, counseling, reviewing hfa/ neb/ 02 /pulse ox / flutter  device(s) , directly observing portions of ambulatory 02 saturation study/ and generating customized AVS unique to this office visit / same day charting  > 40 min for multiple  refractory respiratory  symptoms of uncertain etiology

## 2023-07-26 NOTE — Patient Instructions (Addendum)
Levaquin 500 mg daily x 10 days  - stop if have any pain ankle tendons   Prednisone 10 mg x 2 each am until better then  1 daily x 5 days and stop    Please remember to go to the  x-ray department  @  [facility] for your tests - we will call you with the results when they are available     Stop losartan for now   We may need to stop your  ACTOS in future as it may be contributing with your breathing problems    Please schedule a follow up office visit in  2 weeks, sooner if needed  Late add:   next step may be  collect mucus for opportunistic gnr and atypical TB - ask about flutter use next ov also  Bring all meds next ov / consider addison's from freq steroid exp

## 2023-07-26 NOTE — Addendum Note (Signed)
Addended by: Christen Butter on: 07/26/2023 01:48 PM   Modules accepted: Orders

## 2023-07-27 LAB — BASIC METABOLIC PANEL
BUN/Creatinine Ratio: 13 (ref 10–24)
BUN: 15 mg/dL (ref 8–27)
CO2: 24 mmol/L (ref 20–29)
Calcium: 10.1 mg/dL (ref 8.6–10.2)
Chloride: 106 mmol/L (ref 96–106)
Creatinine, Ser: 1.17 mg/dL (ref 0.76–1.27)
Glucose: 98 mg/dL (ref 70–99)
Potassium: 4.4 mmol/L (ref 3.5–5.2)
Sodium: 142 mmol/L (ref 134–144)
eGFR: 68 mL/min/{1.73_m2} (ref >59)

## 2023-07-27 LAB — CBC WITH DIFFERENTIAL/PLATELET
Basophils Absolute: 0.1 10*3/uL (ref 0.0–0.2)
Basos: 1 %
EOS (ABSOLUTE): 0.2 10*3/uL (ref 0.0–0.4)
Eos: 4 %
Hematocrit: 37.8 % (ref 37.5–51.0)
Hemoglobin: 12.2 g/dL — ABNORMAL LOW (ref 13.0–17.7)
Immature Grans (Abs): 0 10*3/uL (ref 0.0–0.1)
Immature Granulocytes: 0 %
Lymphocytes Absolute: 1.7 10*3/uL (ref 0.7–3.1)
Lymphs: 28 %
MCH: 29.1 pg (ref 26.6–33.0)
MCHC: 32.3 g/dL (ref 31.5–35.7)
MCV: 90 fL (ref 79–97)
Monocytes Absolute: 0.5 10*3/uL (ref 0.1–0.9)
Monocytes: 8 %
Neutrophils Absolute: 3.7 10*3/uL (ref 1.4–7.0)
Neutrophils: 59 %
Platelets: 211 10*3/uL (ref 150–450)
RBC: 4.19 x10E6/uL (ref 4.14–5.80)
RDW: 13 % (ref 11.6–15.4)
WBC: 6.1 10*3/uL (ref 3.4–10.8)

## 2023-07-27 LAB — BRAIN NATRIURETIC PEPTIDE: BNP: 7.6 pg/mL (ref 0.0–100.0)

## 2023-07-27 LAB — D-DIMER, QUANTITATIVE: D-DIMER: 0.41 mg{FEU}/L (ref 0.00–0.49)

## 2023-07-27 LAB — SEDIMENTATION RATE: Sed Rate: 32 mm/h — ABNORMAL HIGH (ref 0–30)

## 2023-07-27 LAB — TSH: TSH: 2.09 u[IU]/mL (ref 0.450–4.500)

## 2023-07-29 ENCOUNTER — Encounter: Payer: Self-pay | Admitting: Gastroenterology

## 2023-08-01 ENCOUNTER — Other Ambulatory Visit: Payer: Self-pay | Admitting: Internal Medicine

## 2023-08-01 ENCOUNTER — Other Ambulatory Visit: Payer: Self-pay | Admitting: Gastroenterology

## 2023-08-01 DIAGNOSIS — K7400 Hepatic fibrosis, unspecified: Secondary | ICD-10-CM

## 2023-08-01 DIAGNOSIS — R600 Localized edema: Secondary | ICD-10-CM

## 2023-08-01 DIAGNOSIS — K7581 Nonalcoholic steatohepatitis (NASH): Secondary | ICD-10-CM

## 2023-08-01 LAB — CORTISOL: Cortisol: 11.7 ug/dL (ref 6.2–19.4)

## 2023-08-01 LAB — SPECIMEN STATUS REPORT

## 2023-08-02 DIAGNOSIS — E1129 Type 2 diabetes mellitus with other diabetic kidney complication: Secondary | ICD-10-CM | POA: Diagnosis not present

## 2023-08-08 ENCOUNTER — Encounter (HOSPITAL_COMMUNITY): Payer: Self-pay

## 2023-08-08 ENCOUNTER — Emergency Department (HOSPITAL_COMMUNITY): Payer: PPO

## 2023-08-08 ENCOUNTER — Inpatient Hospital Stay (HOSPITAL_COMMUNITY)
Admission: EM | Admit: 2023-08-08 | Discharge: 2023-08-10 | DRG: 193 | Disposition: A | Discharge status: Home / Self Care | Payer: PPO | Source: Ambulatory Visit | Attending: Family Medicine | Admitting: Family Medicine

## 2023-08-08 ENCOUNTER — Other Ambulatory Visit: Payer: Self-pay

## 2023-08-08 ENCOUNTER — Encounter: Payer: Self-pay | Admitting: Internal Medicine

## 2023-08-08 ENCOUNTER — Ambulatory Visit: Payer: PPO | Admitting: Internal Medicine

## 2023-08-08 VITALS — BP 91/51 | HR 132 | Temp 101.6°F | Ht 67.0 in | Wt 167.0 lb

## 2023-08-08 DIAGNOSIS — D72823 Leukemoid reaction: Secondary | ICD-10-CM | POA: Diagnosis not present

## 2023-08-08 DIAGNOSIS — J449 Chronic obstructive pulmonary disease, unspecified: Secondary | ICD-10-CM

## 2023-08-08 DIAGNOSIS — R918 Other nonspecific abnormal finding of lung field: Secondary | ICD-10-CM | POA: Diagnosis not present

## 2023-08-08 DIAGNOSIS — I959 Hypotension, unspecified: Secondary | ICD-10-CM

## 2023-08-08 DIAGNOSIS — J9611 Chronic respiratory failure with hypoxia: Secondary | ICD-10-CM | POA: Diagnosis not present

## 2023-08-08 DIAGNOSIS — J320 Chronic maxillary sinusitis: Secondary | ICD-10-CM | POA: Diagnosis present

## 2023-08-08 DIAGNOSIS — Z1152 Encounter for screening for COVID-19: Secondary | ICD-10-CM

## 2023-08-08 DIAGNOSIS — Z981 Arthrodesis status: Secondary | ICD-10-CM

## 2023-08-08 DIAGNOSIS — J189 Pneumonia, unspecified organism: Principal | ICD-10-CM | POA: Diagnosis present

## 2023-08-08 DIAGNOSIS — K7581 Nonalcoholic steatohepatitis (NASH): Secondary | ICD-10-CM | POA: Diagnosis present

## 2023-08-08 DIAGNOSIS — R531 Weakness: Secondary | ICD-10-CM | POA: Diagnosis not present

## 2023-08-08 DIAGNOSIS — K7460 Unspecified cirrhosis of liver: Secondary | ICD-10-CM | POA: Diagnosis present

## 2023-08-08 DIAGNOSIS — Z9981 Dependence on supplemental oxygen: Secondary | ICD-10-CM

## 2023-08-08 DIAGNOSIS — Z860100 Personal history of colon polyps, unspecified: Secondary | ICD-10-CM

## 2023-08-08 DIAGNOSIS — R000 Tachycardia, unspecified: Secondary | ICD-10-CM | POA: Diagnosis present

## 2023-08-08 DIAGNOSIS — K7400 Hepatic fibrosis, unspecified: Secondary | ICD-10-CM | POA: Diagnosis present

## 2023-08-08 DIAGNOSIS — K219 Gastro-esophageal reflux disease without esophagitis: Secondary | ICD-10-CM | POA: Diagnosis not present

## 2023-08-08 DIAGNOSIS — J441 Chronic obstructive pulmonary disease with (acute) exacerbation: Secondary | ICD-10-CM | POA: Diagnosis not present

## 2023-08-08 DIAGNOSIS — J9601 Acute respiratory failure with hypoxia: Secondary | ICD-10-CM | POA: Diagnosis present

## 2023-08-08 DIAGNOSIS — I952 Hypotension due to drugs: Secondary | ICD-10-CM | POA: Diagnosis not present

## 2023-08-08 DIAGNOSIS — J9621 Acute and chronic respiratory failure with hypoxia: Principal | ICD-10-CM | POA: Diagnosis present

## 2023-08-08 DIAGNOSIS — J432 Centrilobular emphysema: Secondary | ICD-10-CM | POA: Diagnosis not present

## 2023-08-08 DIAGNOSIS — I10 Essential (primary) hypertension: Secondary | ICD-10-CM | POA: Diagnosis present

## 2023-08-08 DIAGNOSIS — J440 Chronic obstructive pulmonary disease with acute lower respiratory infection: Secondary | ICD-10-CM | POA: Diagnosis present

## 2023-08-08 DIAGNOSIS — D696 Thrombocytopenia, unspecified: Secondary | ICD-10-CM | POA: Diagnosis present

## 2023-08-08 DIAGNOSIS — J439 Emphysema, unspecified: Secondary | ICD-10-CM | POA: Diagnosis present

## 2023-08-08 DIAGNOSIS — R058 Other specified cough: Secondary | ICD-10-CM | POA: Diagnosis not present

## 2023-08-08 DIAGNOSIS — Z87891 Personal history of nicotine dependence: Secondary | ICD-10-CM | POA: Diagnosis not present

## 2023-08-08 DIAGNOSIS — E785 Hyperlipidemia, unspecified: Secondary | ICD-10-CM | POA: Diagnosis present

## 2023-08-08 DIAGNOSIS — T380X5A Adverse effect of glucocorticoids and synthetic analogues, initial encounter: Secondary | ICD-10-CM | POA: Diagnosis not present

## 2023-08-08 DIAGNOSIS — Z79899 Other long term (current) drug therapy: Secondary | ICD-10-CM

## 2023-08-08 DIAGNOSIS — Z833 Family history of diabetes mellitus: Secondary | ICD-10-CM

## 2023-08-08 DIAGNOSIS — Z7984 Long term (current) use of oral hypoglycemic drugs: Secondary | ICD-10-CM

## 2023-08-08 DIAGNOSIS — E1165 Type 2 diabetes mellitus with hyperglycemia: Secondary | ICD-10-CM | POA: Diagnosis present

## 2023-08-08 DIAGNOSIS — D72829 Elevated white blood cell count, unspecified: Secondary | ICD-10-CM | POA: Diagnosis present

## 2023-08-08 DIAGNOSIS — J322 Chronic ethmoidal sinusitis: Secondary | ICD-10-CM | POA: Diagnosis present

## 2023-08-08 DIAGNOSIS — J9620 Acute and chronic respiratory failure, unspecified whether with hypoxia or hypercapnia: Secondary | ICD-10-CM | POA: Diagnosis present

## 2023-08-08 DIAGNOSIS — E119 Type 2 diabetes mellitus without complications: Secondary | ICD-10-CM

## 2023-08-08 DIAGNOSIS — Z8249 Family history of ischemic heart disease and other diseases of the circulatory system: Secondary | ICD-10-CM

## 2023-08-08 DIAGNOSIS — R590 Localized enlarged lymph nodes: Secondary | ICD-10-CM | POA: Diagnosis not present

## 2023-08-08 DIAGNOSIS — T501X5A Adverse effect of loop [high-ceiling] diuretics, initial encounter: Secondary | ICD-10-CM | POA: Diagnosis not present

## 2023-08-08 DIAGNOSIS — T461X5A Adverse effect of calcium-channel blockers, initial encounter: Secondary | ICD-10-CM | POA: Diagnosis not present

## 2023-08-08 DIAGNOSIS — A419 Sepsis, unspecified organism: Secondary | ICD-10-CM | POA: Diagnosis not present

## 2023-08-08 DIAGNOSIS — Z825 Family history of asthma and other chronic lower respiratory diseases: Secondary | ICD-10-CM

## 2023-08-08 DIAGNOSIS — Z888 Allergy status to other drugs, medicaments and biological substances status: Secondary | ICD-10-CM

## 2023-08-08 DIAGNOSIS — R0682 Tachypnea, not elsewhere classified: Secondary | ICD-10-CM | POA: Diagnosis not present

## 2023-08-08 DIAGNOSIS — M6281 Muscle weakness (generalized): Secondary | ICD-10-CM | POA: Diagnosis present

## 2023-08-08 DIAGNOSIS — I2510 Atherosclerotic heart disease of native coronary artery without angina pectoris: Secondary | ICD-10-CM | POA: Diagnosis not present

## 2023-08-08 DIAGNOSIS — R0602 Shortness of breath: Secondary | ICD-10-CM | POA: Diagnosis not present

## 2023-08-08 LAB — COMPREHENSIVE METABOLIC PANEL
ALT: 37 U/L (ref 0–44)
AST: 20 U/L (ref 15–41)
Albumin: 3.9 g/dL (ref 3.5–5.0)
Alkaline Phosphatase: 74 U/L (ref 38–126)
Anion gap: 10 (ref 5–15)
BUN: 18 mg/dL (ref 8–23)
CO2: 27 mmol/L (ref 22–32)
Calcium: 9.4 mg/dL (ref 8.9–10.3)
Chloride: 99 mmol/L (ref 98–111)
Creatinine, Ser: 0.78 mg/dL (ref 0.61–1.24)
GFR, Estimated: >60 mL/min (ref >60)
Glucose, Bld: 182 mg/dL — ABNORMAL HIGH (ref 70–99)
Potassium: 4.1 mmol/L (ref 3.5–5.1)
Sodium: 136 mmol/L (ref 135–145)
Total Bilirubin: 1.6 mg/dL — ABNORMAL HIGH (ref 0.0–1.2)
Total Protein: 7.2 g/dL (ref 6.5–8.1)

## 2023-08-08 LAB — URINALYSIS, W/ REFLEX TO CULTURE (INFECTION SUSPECTED)
Bacteria, UA: NONE SEEN
Bilirubin Urine: NEGATIVE
Glucose, UA: 50 mg/dL — AB
Hgb urine dipstick: NEGATIVE
Ketones, ur: NEGATIVE mg/dL
Leukocytes,Ua: NEGATIVE
Nitrite: NEGATIVE
Protein, ur: NEGATIVE mg/dL
Specific Gravity, Urine: 1.041 — ABNORMAL HIGH (ref 1.005–1.030)
pH: 5 (ref 5.0–8.0)

## 2023-08-08 LAB — PROTIME-INR
INR: 1 (ref 0.8–1.2)
Prothrombin Time: 13.2 s (ref 11.4–15.2)

## 2023-08-08 LAB — CBC WITH DIFFERENTIAL/PLATELET
Abs Immature Granulocytes: 0.07 10*3/uL (ref 0.00–0.07)
Basophils Absolute: 0 10*3/uL (ref 0.0–0.1)
Basophils Relative: 0 %
Eosinophils Absolute: 0.1 10*3/uL (ref 0.0–0.5)
Eosinophils Relative: 1 %
HCT: 42 % (ref 39.0–52.0)
Hemoglobin: 13.8 g/dL (ref 13.0–17.0)
Immature Granulocytes: 1 %
Lymphocytes Relative: 10 %
Lymphs Abs: 1.3 10*3/uL (ref 0.7–4.0)
MCH: 27.9 pg (ref 26.0–34.0)
MCHC: 32.9 g/dL (ref 30.0–36.0)
MCV: 85 fL (ref 80.0–100.0)
Monocytes Absolute: 0.8 10*3/uL (ref 0.1–1.0)
Monocytes Relative: 7 %
Neutro Abs: 10.2 10*3/uL — ABNORMAL HIGH (ref 1.7–7.7)
Neutrophils Relative %: 81 %
Platelets: 128 10*3/uL — ABNORMAL LOW (ref 150–400)
RBC: 4.94 MIL/uL (ref 4.22–5.81)
RDW: 15.3 % (ref 11.5–15.5)
WBC: 12.5 10*3/uL — ABNORMAL HIGH (ref 4.0–10.5)
nRBC: 0 % (ref 0.0–0.2)

## 2023-08-08 LAB — RESP PANEL BY RT-PCR (RSV, FLU A&B, COVID)  RVPGX2
Influenza A by PCR: NEGATIVE
Influenza B by PCR: NEGATIVE
Resp Syncytial Virus by PCR: NEGATIVE
SARS Coronavirus 2 by RT PCR: NEGATIVE

## 2023-08-08 LAB — CBG MONITORING, ED
Glucose-Capillary: 201 mg/dL — ABNORMAL HIGH (ref 70–99)
Glucose-Capillary: 334 mg/dL — ABNORMAL HIGH (ref 70–99)

## 2023-08-08 LAB — LACTIC ACID, PLASMA: Lactic Acid, Venous: 1.8 mmol/L (ref 0.5–1.9)

## 2023-08-08 MED ORDER — SODIUM CHLORIDE 0.9 % IV SOLN
500.0000 mg | INTRAVENOUS | Status: DC
  Administered 2023-08-09 – 2023-08-10 (×2): 500 mg via INTRAVENOUS
  Filled 2023-08-08 (×2): qty 5

## 2023-08-08 MED ORDER — PANTOPRAZOLE SODIUM 40 MG PO TBEC
40.0000 mg | DELAYED_RELEASE_TABLET | Freq: Every morning | ORAL | Status: DC
  Administered 2023-08-09 – 2023-08-10 (×2): 40 mg via ORAL
  Filled 2023-08-08 (×2): qty 1

## 2023-08-08 MED ORDER — IOHEXOL 350 MG/ML SOLN
75.0000 mL | Freq: Once | INTRAVENOUS | Status: AC | PRN
  Administered 2023-08-08: 75 mL via INTRAVENOUS

## 2023-08-08 MED ORDER — ONDANSETRON HCL 4 MG/2ML IJ SOLN: 4.0000 mg | Freq: Four times a day (QID) | INTRAMUSCULAR | Status: DC | PRN

## 2023-08-08 MED ORDER — METHYLPREDNISOLONE SODIUM SUCC 125 MG IJ SOLR
60.0000 mg | Freq: Two times a day (BID) | INTRAMUSCULAR | Status: DC
  Administered 2023-08-08 – 2023-08-09 (×2): 60 mg via INTRAVENOUS
  Filled 2023-08-08 (×2): qty 2

## 2023-08-08 MED ORDER — BUDESONIDE 0.25 MG/2ML IN SUSP
0.2500 mg | Freq: Two times a day (BID) | RESPIRATORY_TRACT | Status: DC
  Administered 2023-08-08 – 2023-08-10 (×4): 0.25 mg via RESPIRATORY_TRACT
  Filled 2023-08-08 (×4): qty 2

## 2023-08-08 MED ORDER — INSULIN ASPART 100 UNIT/ML IJ SOLN
0.0000 [IU] | Freq: Every day | INTRAMUSCULAR | Status: DC
  Administered 2023-08-08: 4 [IU] via SUBCUTANEOUS
  Administered 2023-08-09: 5 [IU] via SUBCUTANEOUS
  Filled 2023-08-08: qty 1

## 2023-08-08 MED ORDER — LACTATED RINGERS IV BOLUS
1000.0000 mL | Freq: Once | INTRAVENOUS | Status: AC
  Administered 2023-08-08: 1000 mL via INTRAVENOUS

## 2023-08-08 MED ORDER — IPRATROPIUM-ALBUTEROL 0.5-2.5 (3) MG/3ML IN SOLN
3.0000 mL | Freq: Three times a day (TID) | RESPIRATORY_TRACT | Status: DC
  Administered 2023-08-08 – 2023-08-10 (×6): 3 mL via RESPIRATORY_TRACT
  Filled 2023-08-08 (×6): qty 3

## 2023-08-08 MED ORDER — INSULIN ASPART 100 UNIT/ML IJ SOLN
6.0000 [IU] | Freq: Three times a day (TID) | INTRAMUSCULAR | Status: DC
  Administered 2023-08-09 (×2): 6 [IU] via SUBCUTANEOUS

## 2023-08-08 MED ORDER — DILTIAZEM HCL ER COATED BEADS 240 MG PO CP24: 240.0000 mg | ORAL_CAPSULE | Freq: Every morning | ORAL | Status: DC

## 2023-08-08 MED ORDER — ONDANSETRON HCL 4 MG PO TABS: 4.0000 mg | ORAL_TABLET | Freq: Four times a day (QID) | ORAL | Status: DC | PRN

## 2023-08-08 MED ORDER — GUAIFENESIN ER 600 MG PO TB12
1200.0000 mg | ORAL_TABLET | Freq: Two times a day (BID) | ORAL | Status: DC
  Administered 2023-08-08 – 2023-08-10 (×4): 1200 mg via ORAL
  Filled 2023-08-08 (×4): qty 2

## 2023-08-08 MED ORDER — ARFORMOTEROL TARTRATE 15 MCG/2ML IN NEBU
15.0000 ug | INHALATION_SOLUTION | Freq: Two times a day (BID) | RESPIRATORY_TRACT | Status: DC
  Administered 2023-08-08 – 2023-08-10 (×4): 15 ug via RESPIRATORY_TRACT
  Filled 2023-08-08 (×4): qty 2

## 2023-08-08 MED ORDER — DILTIAZEM HCL ER COATED BEADS 120 MG PO CP24
120.0000 mg | ORAL_CAPSULE | Freq: Every morning | ORAL | Status: DC
  Administered 2023-08-09 – 2023-08-10 (×2): 120 mg via ORAL
  Filled 2023-08-08 (×2): qty 1

## 2023-08-08 MED ORDER — ATORVASTATIN CALCIUM 40 MG PO TABS
40.0000 mg | ORAL_TABLET | Freq: Every morning | ORAL | Status: DC
  Administered 2023-08-09 – 2023-08-10 (×2): 40 mg via ORAL
  Filled 2023-08-08 (×2): qty 1

## 2023-08-08 MED ORDER — FAMOTIDINE 20 MG PO TABS
20.0000 mg | ORAL_TABLET | Freq: Every day | ORAL | Status: DC
  Administered 2023-08-08 – 2023-08-09 (×2): 20 mg via ORAL
  Filled 2023-08-08 (×2): qty 1

## 2023-08-08 MED ORDER — SODIUM CHLORIDE 0.9 % IV SOLN
1.0000 g | Freq: Once | INTRAVENOUS | Status: DC
  Filled 2023-08-08: qty 10

## 2023-08-08 MED ORDER — ACETAMINOPHEN 325 MG PO TABS: 650.0000 mg | ORAL_TABLET | Freq: Four times a day (QID) | ORAL | Status: DC | PRN

## 2023-08-08 MED ORDER — BISACODYL 5 MG PO TBEC
5.0000 mg | DELAYED_RELEASE_TABLET | Freq: Every day | ORAL | Status: DC | PRN
Start: 2023-08-08 — End: 2023-08-10

## 2023-08-08 MED ORDER — ENOXAPARIN SODIUM 40 MG/0.4ML IJ SOSY
40.0000 mg | PREFILLED_SYRINGE | INTRAMUSCULAR | Status: DC
  Administered 2023-08-09 – 2023-08-10 (×2): 40 mg via SUBCUTANEOUS
  Filled 2023-08-08 (×2): qty 0.4

## 2023-08-08 MED ORDER — FUROSEMIDE 20 MG PO TABS
20.0000 mg | ORAL_TABLET | Freq: Every day | ORAL | Status: DC
  Administered 2023-08-09: 20 mg via ORAL
  Filled 2023-08-08: qty 1

## 2023-08-08 MED ORDER — FERROUS SULFATE 325 (65 FE) MG PO TABS
325.0000 mg | ORAL_TABLET | Freq: Every morning | ORAL | Status: DC
  Administered 2023-08-09 – 2023-08-10 (×2): 325 mg via ORAL
  Filled 2023-08-08 (×2): qty 1

## 2023-08-08 MED ORDER — OXYCODONE HCL 5 MG PO TABS: 5.0000 mg | ORAL_TABLET | Freq: Four times a day (QID) | ORAL | Status: DC | PRN

## 2023-08-08 MED ORDER — ACETAMINOPHEN 650 MG RE SUPP: 650.0000 mg | Freq: Four times a day (QID) | RECTAL | Status: DC | PRN

## 2023-08-08 MED ORDER — TAMSULOSIN HCL 0.4 MG PO CAPS
0.4000 mg | ORAL_CAPSULE | Freq: Every morning | ORAL | Status: DC
  Administered 2023-08-09 – 2023-08-10 (×2): 0.4 mg via ORAL
  Filled 2023-08-08 (×2): qty 1

## 2023-08-08 MED ORDER — SODIUM CHLORIDE 0.9 % IV SOLN
500.0000 mg | Freq: Once | INTRAVENOUS | Status: AC
  Administered 2023-08-08: 500 mg via INTRAVENOUS
  Filled 2023-08-08: qty 5

## 2023-08-08 MED ORDER — ALPRAZOLAM 0.25 MG PO TABS
0.2500 mg | ORAL_TABLET | Freq: Two times a day (BID) | ORAL | Status: DC | PRN
  Filled 2023-08-08: qty 1

## 2023-08-08 MED ORDER — INSULIN ASPART 100 UNIT/ML IJ SOLN
0.0000 [IU] | Freq: Three times a day (TID) | INTRAMUSCULAR | Status: DC
  Administered 2023-08-08 – 2023-08-09 (×3): 7 [IU] via SUBCUTANEOUS
  Administered 2023-08-09 – 2023-08-10 (×2): 11 [IU] via SUBCUTANEOUS
  Filled 2023-08-08: qty 1

## 2023-08-08 MED ORDER — CEFTRIAXONE SODIUM 2 G IJ SOLR
2.0000 g | INTRAMUSCULAR | Status: DC
  Administered 2023-08-08 – 2023-08-09 (×2): 2 g via INTRAVENOUS
  Filled 2023-08-08 (×2): qty 20

## 2023-08-08 MED ORDER — SERTRALINE HCL 50 MG PO TABS
50.0000 mg | ORAL_TABLET | Freq: Every day | ORAL | Status: DC
  Administered 2023-08-09 – 2023-08-10 (×2): 50 mg via ORAL
  Filled 2023-08-08 (×2): qty 1

## 2023-08-08 MED ORDER — FENTANYL CITRATE PF 50 MCG/ML IJ SOSY: 12.5000 ug | PREFILLED_SYRINGE | INTRAMUSCULAR | Status: DC | PRN

## 2023-08-08 NOTE — ED Provider Notes (Signed)
[place] EMERGENCY DEPARTMENT AT [facility] Provider Note   CSN: [id number] Arrival date & time: 08/08/23  1040     History  Chief Complaint  Patient presents with   Shortness of Breath    [name] is a 68 y.o. male.  He has a history of COPD is on oxygen.  Recently finished course of Levaquin and prednisone.  Felt dizzy lightheaded and nauseous today weak and still with cough sometimes productive.  Sats were 84% on 3 L at pulmonary clinic.  They felt he needed further care and brought emergently over to the ED.  Patient denies any chest pain.  He has had some loose stools.  Does not think he has had a fever.  No hemoptysis.  The history is provided by the patient.  Shortness of Breath Severity:  Severe Onset quality:  Gradual Timing:  Constant Progression:  Worsening Chronicity:  Recurrent Worsened by:  Activity and coughing Ineffective treatments:  Oxygen Associated symptoms: cough and sputum production   Associated symptoms: no abdominal pain, no chest pain, no headaches and no vomiting        Home Medications Prior to Admission medications   Medication Sig Start Date End Date Taking? Authorizing Provider  acetaminophen (TYLENOL) 325 MG tablet Take 2 tablets (650 mg total) by mouth every 6 (six) hours as needed for mild pain (pain score 1-3) (or Fever >/= 101). 06/11/23   Shon Hale, MD  albuterol (PROVENTIL) (2.5 MG/3ML) 0.083% nebulizer solution Take 3 mLs (2.5 mg total) by nebulization every 2 (two) hours as needed for wheezing or shortness of breath (cough). 06/11/23   Shon Hale, MD  albuterol (VENTOLIN HFA) 108 (90 Base) MCG/ACT inhaler Inhale 2 puffs into the lungs every 4 (four) hours as needed for wheezing or shortness of breath. 06/11/23   Shon Hale, MD  ALPRAZolam Prudy Feeler) 0.25 MG tablet Take 0.25 mg by mouth 2 (two) times daily as needed for sleep or anxiety.    [provider]  atorvastatin (LIPITOR) 40 MG tablet Take  40 mg by mouth in the morning.    [provider]  BREZTRI AEROSPHERE 160-9-4.8 MCG/ACT AERO INHALE 2 PUFFS INTO THE LUNGS TWICE A DAY 05/10/23   Nyoka Cowden, MD  Cholecalciferol (VITAMIN D3) 25 MCG (1000 UT) CAPS Take 1,000 Units by mouth in the morning.    [provider]  diltiazem (CARDIZEM CD) 240 MG 24 hr capsule Take 240 mg by mouth in the morning. 01/12/21   [provider]  famotidine (PEPCID) 20 MG tablet TAKE 1 TABLET BY MOUTH EVERY DAY AFTER SUPPER 08/06/23   Nyoka Cowden, MD  ferrous sulfate 325 (65 FE) MG tablet Take 325 mg by mouth in the morning.    [provider]  fluticasone (FLONASE) 50 MCG/ACT nasal spray Place 1 spray into both nostrils daily as needed for allergies or rhinitis.    [provider]  furosemide (LASIX) 20 MG tablet TAKE 1 TABLET BY MOUTH EVERY DAY 08/01/23   Letta Median, PA-C  gabapentin (NEURONTIN) 300 MG capsule Take 1 capsule (300 mg total) by mouth at bedtime. 06/13/20   Shon Hale, MD  glimepiride (AMARYL) 1 MG tablet Take 1 mg by mouth daily with breakfast.    [provider]  guaiFENesin (MUCINEX) 600 MG 12 hr tablet Take 1 tablet (600 mg total) by mouth 2 (two) times daily. 06/11/23 06/10/24  Shon Hale, MD  levofloxacin (LEVAQUIN) 500 MG tablet Take 1 tablet (  500 mg total) by mouth daily. 07/26/23   Nyoka Cowden, MD  losartan (COZAAR) 25 MG tablet Take 25 mg by mouth daily. 01/18/23   [provider]  metFORMIN (GLUCOPHAGE) 1000 MG tablet Take 1,000 mg by mouth daily with supper. 06/20/21   [provider]  Multiple Vitamin (MULTIVITAMIN WITH MINERALS) TABS tablet Take 1 tablet by mouth at bedtime. 06/13/20   Shon Hale, MD  OXYGEN Inhale 3 L into the lungs continuous.    [provider]  pantoprazole (PROTONIX) 40 MG tablet Take 1 tablet (40 mg total) by mouth in the morning. 05/08/23   Letta Median, PA-C  pioglitazone (ACTOS) 15 MG tablet Take  15 mg by mouth daily. 07/01/22   [provider]  predniSONE (DELTASONE) 10 MG tablet 2 daily until better then 1 daily x  5 days and stop 07/26/23   Nyoka Cowden, MD  pyridOXINE (B-6) 50 MG tablet Take 50 mg by mouth daily.    [provider]  sertraline (ZOLOFT) 50 MG tablet Take 1 tablet (50 mg total) by mouth daily. Patient taking differently: Take 50 mg by mouth in the morning. 06/13/20   Shon Hale, MD  spironolactone (ALDACTONE) 25 MG tablet TAKE 1 TABLET (25 MG TOTAL) BY MOUTH DAILY. 06/17/23 06/16/24  Letta Median, PA-C  tamsulosin (FLOMAX) 0.4 MG CAPS capsule Take 0.4 mg by mouth in the morning.    [provider]  vitamin C (ASCORBIC ACID) 250 MG tablet Take 250 mg by mouth in the morning.    [provider]  Wheat Dextrin (BENEFIBER PO) Take 1 Capful by mouth daily.    [provider]      Allergies    Lioresal [baclofen] and Zocor [simvastatin]    Review of Systems   Review of Systems  Constitutional:  Positive for fatigue.  Respiratory:  Positive for cough, sputum production and shortness of breath.   Cardiovascular:  Negative for chest pain.  Gastrointestinal:  Positive for diarrhea and nausea. Negative for abdominal pain and vomiting.  Neurological:  Positive for dizziness and light-headedness. Negative for headaches.    Physical Exam Updated Vital Signs BP 103/67 (BP Location: Right Arm)   Pulse (!) 126   Temp (!) 100.5 F (38.1 C) (Oral)   Resp (!) 24   Ht 5\' 7"  (1.702 m)   Wt 75.7 kg   SpO2 96%   BMI 26.14 kg/m  Physical Exam Vitals and nursing note reviewed.  Constitutional:      General: He is in acute distress.     Appearance: He is well-developed.  HENT:     Head: Normocephalic and atraumatic.  Eyes:     Conjunctiva/sclera: Conjunctivae normal.  Cardiovascular:     Rate and Rhythm: Regular rhythm. Tachycardia present.     Heart sounds: No murmur heard. Pulmonary:     Effort: Tachypnea and  accessory muscle usage present. No respiratory distress.     Breath sounds: Decreased breath sounds present.  Abdominal:     Palpations: Abdomen is soft.     Tenderness: There is no abdominal tenderness.  Musculoskeletal:        General: No swelling.     Cervical back: Neck supple.     Right lower leg: No tenderness. No edema.     Left lower leg: No tenderness. No edema.  Skin:    General: Skin is dry.     Capillary Refill: Capillary refill takes less than 2 seconds.  Neurological:  General: No focal deficit present.     Mental Status: He is alert.     ED Results / Procedures / Treatments   Labs (all labs ordered are listed, but only abnormal results are displayed) Labs Reviewed  COMPREHENSIVE METABOLIC PANEL - Abnormal; Notable for the following components:      Result Value   Glucose, Bld 182 (*)    Total Bilirubin 1.6 (*)    All other components within normal limits  CBC WITH DIFFERENTIAL/PLATELET - Abnormal; Notable for the following components:   WBC 12.5 (*)    Platelets 128 (*)    Neutro Abs 10.2 (*)    All other components within normal limits  URINALYSIS, W/ REFLEX TO CULTURE (INFECTION SUSPECTED) - Abnormal; Notable for the following components:   Specific Gravity, Urine 1.041 (*)    Glucose, UA 50 (*)    All other components within normal limits  CBG MONITORING, ED - Abnormal; Notable for the following components:   Glucose-Capillary 201 (*)    All other components within normal limits  RESP PANEL BY RT-PCR (RSV, FLU A&B, COVID)  [id number]  CULTURE, BLOOD (ROUTINE X 2)  CULTURE, BLOOD (ROUTINE X 2)  LACTIC ACID, PLASMA  PROTIME-INR  COMPREHENSIVE METABOLIC PANEL  MAGNESIUM  CBC WITH DIFFERENTIAL/PLATELET  BRAIN NATRIURETIC PEPTIDE    EKG EKG Interpretation Date/Time:  Thursday August 08 2023 10:50:23 EST Ventricular Rate:  130 PR Interval:  148 QRS Duration:  74 QT Interval:  338 QTC Calculation: 497 R Axis:   73  Text Interpretation: Sinus  tachycardia Possible Left atrial enlargement Nonspecific ST abnormality Abnormal ECG When compared with ECG of 08-Jun-2023 15:08, rate is faster and nonspecific ST now Confirmed by Meridee Score [phone]) on 08/08/2023 10:53:26 AM  Radiology CT Angio Chest PE W/Cm &/Or Wo Cm Result Date: 08/08/2023 CLINICAL DATA:  Fever, tachycardia, tachypnea, and productive cough EXAM: CT ANGIOGRAPHY CHEST WITH CONTRAST TECHNIQUE: Multidetector CT imaging of the chest was performed using the standard protocol during bolus administration of intravenous contrast. Multiplanar CT image reconstructions and MIPs were obtained to evaluate the vascular anatomy. RADIATION DOSE REDUCTION: This exam was performed according to the departmental dose-optimization program which includes automated exposure control, adjustment of the mA and/or kV according to patient size and/or use of iterative reconstruction technique. CONTRAST:  75mL OMNIPAQUE IOHEXOL 350 MG/ML SOLN COMPARISON:  Same day chest radiograph, CT chest dated 07/02/2023, 06/28/2022 FINDINGS: Cardiovascular: The study is high quality for the evaluation of pulmonary embolism. There are no filling defects in the central, lobar, segmental or subsegmental pulmonary artery branches to suggest acute pulmonary embolism. Great vessels are normal in course and caliber. Normal heart size. No significant pericardial fluid/thickening. Coronary artery calcifications and aortic atherosclerosis. Mediastinum/Nodes: Imaged thyroid gland without nodules meeting criteria for imaging follow-up by size. Normal esophagus. 10 mm left hilar lymphadenopathy (4:51). Lungs/Pleura: The central airways are patent. Trace layering secretions within the trachea extending into bilateral lower lobe bronchi. Moderate bronchial wall thickening of the left lower lobe. Similar upper lung predominant centrilobular and paraseptal emphysema, right-greater-than-left. Unchanged right upper lobe calcified granulomata. New  multifocal ground-glass opacities in the lingula and medial right middle and lower lobes. New peripheral predominant nodularity, ground-glass opacities, and irregular consolidation involving the left lower lobe. Example, subpleural left lower lobe nodule measures 2.1 x 1.2 cm (6:100). No pneumothorax. No pleural effusion. Upper abdomen: Unchanged subcentimeter hypodensity in the peripheral upper spleen appears unchanged compared to 06/28/2022, likely benign. Musculoskeletal: No acute or abnormal lytic or  blastic osseous lesions. Unchanged superior endplate deformity of T9 and wedging of T11. Review of the MIP images confirms the above findings. IMPRESSION: 1. No evidence of pulmonary embolism. 2. New peripheral predominant nodularity, ground-glass opacities, and irregular consolidation involving the left lower lobe, likely infectious/inflammatory. Additional new multifocal ground-glass opacities in the lingula and medial right middle and lower lobes, also likely infectious/inflammatory. 3. Mild left hilar lymphadenopathy, likely reactive. 4. Aortic Atherosclerosis (ICD10-I70.0) and Emphysema (ICD10-J43.9). Coronary artery calcifications. Assessment for potential risk factor modification, dietary therapy or pharmacologic therapy may be warranted, if clinically indicated. Electronically Signed   By: Agustin Cree M.D.   On: 08/08/2023 13:33   DG Chest 2 View Result Date: 08/08/2023 CLINICAL DATA:  Suspected sepsis.  Tachypnea.  Productive cough. EXAM: CHEST - 2 VIEW COMPARISON:  07/26/2023. FINDINGS: Essentially stable exam. Redemonstration of increased interstitial markings, which corresponds to emphysematous changes and fibrosis/scarring on the recent chest CT scan. There are stable 3 calcified granulomas overlying the right upper lung zone. There are nonspecific opacities overlying the right lower lung zone, laterally which also appear grossly similar to the prior CT scan. No acute dense consolidation or lung  collapse seen. Correlate clinically to determine the need for additional imaging with chest CT scan. Bilateral costophrenic angles are clear. Stable cardio-mediastinal silhouette. No acute osseous abnormalities. The soft tissues are within normal limits. IMPRESSION: *Chronic nonspecific opacities throughout bilateral lungs, as discussed above. No acute dense consolidation or lung collapse seen. Correlate clinically to determine the need for additional imaging with chest CT scan. Electronically Signed   By: Jules Schick M.D.   On: 08/08/2023 11:47    Procedures Procedures    Medications Ordered in ED Medications  azithromycin (ZITHROMAX) 500 mg in sodium chloride 0.9 % 250 mL IVPB (has no administration in time range)  cefTRIAXone (ROCEPHIN) 2 g in sodium chloride 0.9 % 100 mL IVPB (2 g Intravenous New Bag/Given 08/08/23 1652)  atorvastatin (LIPITOR) tablet 40 mg (has no administration in time range)  furosemide (LASIX) tablet 20 mg (has no administration in time range)  ALPRAZolam (XANAX) tablet 0.25 mg (has no administration in time range)  sertraline (ZOLOFT) tablet 50 mg (has no administration in time range)  pantoprazole (PROTONIX) EC tablet 40 mg (has no administration in time range)  famotidine (PEPCID) tablet 20 mg (has no administration in time range)  tamsulosin (FLOMAX) capsule 0.4 mg (has no administration in time range)  ferrous sulfate tablet 325 mg (has no administration in time range)  budesonide (PULMICORT) nebulizer solution 0.25 mg (has no administration in time range)  arformoterol (BROVANA) nebulizer solution 15 mcg (has no administration in time range)  ipratropium-albuterol (DUONEB) 0.5-2.5 (3) MG/3ML nebulizer solution 3 mL (3 mLs Nebulization Given 08/08/23 1521)  insulin aspart (novoLOG) injection 0-20 Units (7 Units Subcutaneous Given 08/08/23 1726)  insulin aspart (novoLOG) injection 0-5 Units (has no administration in time range)  insulin aspart (novoLOG) injection  6 Units (6 Units Subcutaneous Not Given 08/08/23 1726)  enoxaparin (LOVENOX) injection 40 mg (has no administration in time range)  methylPREDNISolone sodium succinate (SOLU-MEDROL) 125 mg/2 mL injection 60 mg (60 mg Intravenous Given 08/08/23 1651)  acetaminophen (TYLENOL) tablet 650 mg (has no administration in time range)    Or  acetaminophen (TYLENOL) suppository 650 mg (has no administration in time range)  oxyCODONE (Oxy IR/ROXICODONE) immediate release tablet 5 mg (has no administration in time range)  fentaNYL (SUBLIMAZE) injection 12.5 mcg (has no administration in time range)  guaiFENesin (MUCINEX) 12  hr tablet 1,200 mg (1,200 mg Oral Given 08/08/23 1648)  bisacodyl (DULCOLAX) EC tablet 5 mg (has no administration in time range)  ondansetron (ZOFRAN) tablet 4 mg (has no administration in time range)    Or  ondansetron (ZOFRAN) injection 4 mg (has no administration in time range)  diltiazem (CARDIZEM CD) 24 hr capsule 120 mg (has no administration in time range)  iohexol (OMNIPAQUE) 350 MG/ML injection 75 mL (75 mLs Intravenous Contrast Given 08/08/23 1241)  azithromycin (ZITHROMAX) 500 mg in sodium chloride 0.9 % 250 mL IVPB (0 mg Intravenous Stopped 08/08/23 1616)  lactated ringers bolus 1,000 mL (0 mLs Intravenous Stopped 08/08/23 1655)    ED Course/ Medical Decision Making/ A&P Clinical Course as of 08/08/23 1727  Thu Aug 08, 2023  1152 X-ray showing diffuse interstitial opacities no focal consolidation.  Awaiting radiology reading. [MB]  1255 Patient's wife at bedside and I updated her on current results and plan [MB]  1426 Discussed with Dr. Laural Benes [facility] hospitalist who will evaluate patient for admission [MB]    Clinical Course User Index [MB] Terrilee Files, MD                                 Medical Decision Making Amount and/or Complexity of Data Reviewed Labs: ordered. Radiology: ordered.  Risk Prescription drug management. Decision regarding  hospitalization.   This patient complains of general weakness cough shortness of breath; this involves an extensive number of treatment Options and is a complaint that carries with it a high risk of complications and morbidity. The differential includes infection, sepsis, COPD, dehydration, metabolic derangement  I ordered, reviewed and interpreted labs, which included CBC with mildly elevated white count, chemistries elevated glucose, COVID and flu negative, lactate normal, blood culture sent I ordered medication IV fluids IV antibiotics and reviewed PMP when indicated. I ordered imaging studies which included chest x-ray and CT angio chest and I independently    visualized and interpreted imaging which showed increased opacities possible infection Additional history obtained from patient's wife Previous records obtained and reviewed in epic including pulmonary notes from today I consulted Dr. Laural Benes [facility] hospitalist and discussed lab and imaging findings and discussed disposition.  Cardiac monitoring reviewed, sinus tachycardia Social determinants considered, no significant barriers Critical Interventions: None  After the interventions stated above, I reevaluated the patient and found patient to be hemodynamically stable although blood pressure still soft Admission and further testing considered, he would benefit from mission to the hospital for further workup and management.  Patient in agreement with plan for admission.         Final Clinical Impression(s) / ED Diagnoses Final diagnoses:  Community acquired pneumonia, unspecified laterality  Pulmonary emphysema, unspecified emphysema type ([name])  General weakness    Rx / DC Orders ED Discharge Orders     None         Terrilee Files, MD 08/08/23 1730

## 2023-08-08 NOTE — H&P (Signed)
History and Physical  [facility]  [name] ZOX:[id number] DOB: 06/26/1956 DOA: 08/08/2023  PCP: Christopher Perches, MD  Patient coming from: sent from pulmonary clinic  Level of care: Stepdown  I have personally briefly reviewed patient's old medical records in [facility]  Chief Complaint: weakness, malaise, SOB  HPI: [name] is a 68 year old male former longtime smoker with Gold III COPD, chronic hypoxic respiratory failure on 3-4 L/min of oxygen, type 2 diabetes mellitus, hypertension, NASH liver disease, liver fibrosis,  chronic thrombocytopenia, dyslipidemia, dysrhythmia, tachycardia, chronic sinusitis, generalized weakness who was seen at his pulmonary clinic today and subsequently sent to ED with concern for sepsis.  He was following up from 07/26/2023 office visit where he was treated for acute COPD exacerbation with prednisone and levofloxacin.  He returned today with symptoms of worsening shortness of breath associated with fever and nausea rattling cough with orange sputum production he was hypoxic at 84% on 3 L/min oxygen.  He appeared weak and he was also noted to be tachycardic and was sent to the ED.  His workup in the ED was negative for pulmonary embolus.  The CTA chest did show findings of an atypical pneumonia and advanced COPD/emphysema.  Patient reported having some loose stools.  Patient denies hemoptysis.  Admission requested for further management of acute on chronic respiratory failure and COPD exacerbation that has failed outpatient management.   Past Medical History:  Diagnosis Date   Anxiety    Arthritis    Asthma    Chest pain    a. 2003 Cath: nl cors.   Concussion    COPD (chronic obstructive pulmonary disease) ([name])    chronically on 3-4L [place]   Depression    Diabetes mellitus    Dyslipidemia    Tachycardia   Dyspnea    Dysrhythmia    Fracture 06/2014   lower back "L-1"   GERD (gastroesophageal reflux disease)    Hypertension    NASH  (nonalcoholic steatohepatitis) 2018   [name], undergoing routine cirrhosis care; [name]   Pneumonia 12/01/2021   PONV (postoperative nausea and vomiting)    Splenomegaly    Thrombocytopenia ([name])     Past Surgical History:  Procedure Laterality Date   BIOPSY  07/26/2016   Procedure: BIOPSY;  Surgeon: [name], MD;  [facility];  Service: Endoscopy;;  gastric   CARDIAC CATHETERIZATION  10/22/2001   normal coronary arteriea (Dr. Aram Candela)   COLONOSCOPY  08/31/2011   [id number] rectal and colon polyps-treated. Single tubular adenoma. Next TCS 08/2016   COLONOSCOPY N/A 07/26/2016   Dr. [name]: diverticulosis in sigmoid and descending colon. 6 mm benign splenic flexure. surveillance 5 yeras   COLONOSCOPY WITH PROPOFOL N/A 08/07/2021   Surgeon: [name], DO;  Nonbleeding internal hemorrhoids, three 3-6 mm polyps in the transverse colon resected and retrieved, otherwise normal exam.  Pathology with 1 tubular adenoma, otherwise benign colonic mucosa.  Recommended 5-year repeat.   ESOPHAGOGASTRODUODENOSCOPY N/A 07/26/2016   Dr. [name]: normal esophagus, portal gastropathy, chronic gastritis, normal duodenum, screening EGD 2 years    ESOPHAGOGASTRODUODENOSCOPY (EGD) WITH ESOPHAGEAL DILATION N/A 05/15/2013   AOZ:HYQMVH esophagus-status post Elease Hashimoto dilation/Portal gastropathy. Antral erosions-status post biopsy. extrinsic compression along lesser curvature likely secondary to splenomegaly. gastric bx benign   ESOPHAGOGASTRODUODENOSCOPY (EGD) WITH PROPOFOL N/A 12/12/2020   Surgeon: [name], MD; ormal esophagus s/p dilation, portal hypertensive gastropathy.  Due for repeat  in 2024.   ESOPHAGOGASTRODUODENOSCOPY (EGD) WITH PROPOFOL N/A 05/22/2023   Procedure: ESOPHAGOGASTRODUODENOSCOPY (EGD) WITH PROPOFOL;  Surgeon: [name], MD;  [facility];  Service: Endoscopy;  Laterality: N/A;  9:30 am, asa 3    ETHMOIDECTOMY Bilateral 03/02/2022   Procedure: ETHMOIDECTOMY;  Surgeon: [name], MD;  Location: [facility] OR;  Service: ENT;  Laterality: Bilateral;   HERNIA REPAIR  03/2009   MALONEY DILATION N/A 12/12/2020   Procedure: Elease Hashimoto DILATION;  Surgeon: [name], MD;  [facility];  Service: Endoscopy;  Laterality: N/A;   MAXILLARY ANTROSTOMY Bilateral 03/02/2022   Procedure: MAXILLARY ANTROSTOMY;  Surgeon: [name], MD;  Location: [facility] OR;  Service: ENT;  Laterality: Bilateral;   NASAL SEPTOPLASTY W/ TURBINOPLASTY Bilateral 03/02/2022   Procedure: NASAL SEPTOPLASTY WITH TURBINATE REDUCTION;  Surgeon: [name], MD;  [name] OR;  Service: ENT;  Laterality: Bilateral;   POLYPECTOMY  07/26/2016   Procedure: POLYPECTOMY;  Surgeon: [name], MD;  [facility];  Service: Endoscopy;;  colon   POLYPECTOMY  08/07/2021   Procedure: POLYPECTOMY;  Surgeon: [name], DO;  [facility];  Service: Endoscopy;;   SINUS ENDO WITH FUSION N/A 03/02/2022   Procedure: SINUS ENDO WITH FUSION;  Surgeon: [name], MD;  Location: [facility] OR;  Service: ENT;  Laterality: N/A;   TRANSTHORACIC ECHOCARDIOGRAM  2011   EF 50-55%, stage 1 diastolic dysfunction, trace TR & pulm valve regurg    VASECTOMY  1989     reports that he quit smoking about 12 years ago. His smoking use included cigarettes. He started smoking about 48 years ago. He has a 45 pack-year smoking history. He has never used smokeless tobacco. He reports that he does not drink alcohol and does not use drugs.  Allergies  Allergen Reactions   Lioresal [Baclofen] Other (See Comments)    Lethargy     Zocor [Simvastatin] Nausea Only    Family History  Problem Relation Age of Onset   Heart disease Mother    Cancer Mother        lymph nodes   Diabetes Mother    Arthritis Other    Lung disease Other    Asthma Other    Kidney disease Other    Ovarian cancer Sister    Diabetes Brother    Heart disease Brother     Hypertension Brother    Diabetes Sister    Diabetes Sister    Cirrhosis Sister 6       no etoh   Colon cancer Neg Hx     Prior to Admission medications   Medication Sig Start Date End Date Taking? Authorizing Provider  acetaminophen (TYLENOL) 325 MG tablet Take 2 tablets (650 mg total) by mouth every 6 (six) hours as needed for mild pain (pain score 1-3) (or Fever >/= 101). 06/11/23  Yes Emokpae, Courage, MD  albuterol (PROVENTIL) (2.5 MG/3ML) 0.083% nebulizer solution Take 3 mLs (2.5 mg total) by nebulization every 2 (two) hours as needed for wheezing or shortness of breath (cough). 06/11/23  Yes Emokpae, Courage, MD  albuterol (VENTOLIN HFA) 108 (90 Base) MCG/ACT inhaler Inhale 2 puffs into the lungs every 4 (four) hours as needed for wheezing or shortness of breath. 06/11/23  Yes Emokpae, Courage, MD  ALPRAZolam Prudy Feeler) 0.25 MG tablet Take 0.25 mg by mouth 2 (two) times daily as needed for sleep or anxiety.   Yes [provider]  atorvastatin (LIPITOR) 40 MG tablet Take  40 mg by mouth in the morning.   Yes [provider]  BREZTRI AEROSPHERE 160-9-4.8 MCG/ACT AERO INHALE 2 PUFFS INTO THE LUNGS TWICE A DAY 05/10/23  Yes Nyoka Cowden, MD  diltiazem (CARDIZEM CD) 240 MG 24 hr capsule Take 240 mg by mouth in the morning. 01/12/21  Yes [provider]  fluticasone (FLONASE) 50 MCG/ACT nasal spray Place 1 spray into both nostrils daily as needed for allergies or rhinitis.   Yes [provider]  furosemide (LASIX) 20 MG tablet TAKE 1 TABLET BY MOUTH EVERY DAY 08/01/23  Yes Ermalinda Memos S, PA-C  gabapentin (NEURONTIN) 300 MG capsule Take 1 capsule (300 mg total) by mouth at bedtime. 06/13/20  Yes Emokpae, Courage, MD  glimepiride (AMARYL) 1 MG tablet Take 1 mg by mouth daily with breakfast.   Yes [provider]  guaiFENesin (MUCINEX) 600 MG 12 hr tablet Take 1 tablet (600 mg total) by mouth 2 (two) times daily. 06/11/23 06/10/24 Yes Emokpae, Courage, MD   losartan (COZAAR) 25 MG tablet Take 25 mg by mouth daily. 01/18/23  Yes [provider]  metFORMIN (GLUCOPHAGE) 1000 MG tablet Take 1,000 mg by mouth daily with supper. 06/20/21  Yes [provider]  pioglitazone (ACTOS) 15 MG tablet Take 15 mg by mouth daily. 07/01/22  Yes [provider]  Cholecalciferol (VITAMIN D3) 25 MCG (1000 UT) CAPS Take 1,000 Units by mouth in the morning.    [provider]  famotidine (PEPCID) 20 MG tablet TAKE 1 TABLET BY MOUTH EVERY DAY AFTER SUPPER 08/06/23   Nyoka Cowden, MD  ferrous sulfate 325 (65 FE) MG tablet Take 325 mg by mouth in the morning.    [provider]  levofloxacin (LEVAQUIN) 500 MG tablet Take 1 tablet (500 mg total) by mouth daily. 07/26/23   Nyoka Cowden, MD  Multiple Vitamin (MULTIVITAMIN WITH MINERALS) TABS tablet Take 1 tablet by mouth at bedtime. 06/13/20   Shon Hale, MD  OXYGEN Inhale 3 L into the lungs continuous.    [provider]  pantoprazole (PROTONIX) 40 MG tablet Take 1 tablet (40 mg total) by mouth in the morning. 05/08/23   Letta Median, PA-C  pyridOXINE (B-6) 50 MG tablet Take 50 mg by mouth daily.    [provider]  sertraline (ZOLOFT) 50 MG tablet Take 1 tablet (50 mg total) by mouth daily. 06/13/20   Shon Hale, MD  spironolactone (ALDACTONE) 25 MG tablet TAKE 1 TABLET (25 MG TOTAL) BY MOUTH DAILY. 06/17/23 06/16/24  Letta Median, PA-C  tamsulosin (FLOMAX) 0.4 MG CAPS capsule Take 0.4 mg by mouth in the morning.    [provider]  vitamin C (ASCORBIC ACID) 250 MG tablet Take 250 mg by mouth in the morning.    [provider]  Wheat Dextrin (BENEFIBER PO) Take 1 Capful by mouth daily.    [provider]    Physical Exam: Vitals:   08/08/23 1430 08/08/23 1445 08/08/23 1500 08/08/23 1521  BP: (!) 89/37 (!) 86/38 (!) 83/46   Pulse: (!) 106 (!) 106 (!) 106   Resp: (!) 27 (!) 26 (!) 21   Temp:      TempSrc:       SpO2: 90% 92% 93% 97%  Weight:      Height:        Constitutional: chronically ill appearing male, appears very weak but nontoxic, NAD, calm, but appears Uncomfortable Eyes: PERRL, lids and conjunctivae normal ENMT: Mucous membranes  are moist. Posterior pharynx clear of any exudate or lesions.  Neck: normal, supple, no masses, no thyromegaly Respiratory: diffuse bilateral expiratory wheezing heard, moderate increased work of breathing.  Cardiovascular: normal s1, s2 sounds, no murmurs / rubs / gallops. No extremity edema. 2+ pedal pulses. No carotid bruits.  Abdomen: no tenderness, no masses palpated. No hepatosplenomegaly. Bowel sounds positive.  Musculoskeletal: no clubbing / cyanosis. No joint deformity upper and lower extremities. Good ROM, no contractures. Normal muscle tone.  Skin: no rashes, lesions, ulcers. No induration Neurologic: CN 2-12 grossly intact. Sensation intact, DTR normal. Strength 5/5 in all 4.  Psychiatric: Normal judgment and insight. Alert and oriented x 3. Normal mood.   Labs on Admission: I have personally reviewed following labs and imaging studies  CBC: Recent Labs  Lab 08/08/23 1121  WBC 12.5*  NEUTROABS 10.2*  HGB 13.8  HCT 42.0  MCV 85.0  PLT 128*   Basic Metabolic Panel: Recent Labs  Lab 08/08/23 1121  NA 136  K 4.1  CL 99  CO2 27  GLUCOSE 182*  BUN 18  CREATININE 0.78  CALCIUM 9.4   GFR: Estimated Creatinine Clearance: 83.8 mL/min (by C-G formula based on SCr of 0.78 mg/dL). Liver Function Tests: Recent Labs  Lab 08/08/23 1121  AST 20  ALT 37  ALKPHOS 74  BILITOT 1.6*  PROT 7.2  ALBUMIN 3.9   No results for input(s): "LIPASE", "AMYLASE" in the last 168 hours. No results for input(s): "AMMONIA" in the last 168 hours. Coagulation Profile: Recent Labs  Lab 08/08/23 1121  INR 1.0   Cardiac Enzymes: No results for input(s): "CKTOTAL", "CKMB", "CKMBINDEX", "TROPONINI" in the last 168 hours. BNP (last 3 results) No  results for input(s): "PROBNP" in the last 8760 hours. HbA1C: No results for input(s): "HGBA1C" in the last 72 hours. CBG: No results for input(s): "GLUCAP" in the last 168 hours. Lipid Profile: No results for input(s): "CHOL", "HDL", "LDLCALC", "TRIG", "CHOLHDL", "LDLDIRECT" in the last 72 hours. Thyroid Function Tests: No results for input(s): "TSH", "T4TOTAL", "FREET4", "T3FREE", "THYROIDAB" in the last 72 hours. Anemia Panel: No results for input(s): "VITAMINB12", "FOLATE", "FERRITIN", "TIBC", "IRON", "RETICCTPCT" in the last 72 hours. Urine analysis:    Component Value Date/Time   COLORURINE YELLOW 08/08/2023 1010   APPEARANCEUR CLEAR 08/08/2023 1010   LABSPEC 1.041 (H) 08/08/2023 1010   PHURINE 5.0 08/08/2023 1010   GLUCOSEU 50 (A) 08/08/2023 1010   HGBUR NEGATIVE 08/08/2023 1010   BILIRUBINUR NEGATIVE 08/08/2023 1010   KETONESUR NEGATIVE 08/08/2023 1010   PROTEINUR NEGATIVE 08/08/2023 1010   NITRITE NEGATIVE 08/08/2023 1010   LEUKOCYTESUR NEGATIVE 08/08/2023 1010    Radiological Exams on Admission: CT Angio Chest PE W/Cm &/Or Wo Cm Result Date: 08/08/2023 CLINICAL DATA:  Fever, tachycardia, tachypnea, and productive cough EXAM: CT ANGIOGRAPHY CHEST WITH CONTRAST TECHNIQUE: Multidetector CT imaging of the chest was performed using the standard protocol during bolus administration of intravenous contrast. Multiplanar CT image reconstructions and MIPs were obtained to evaluate the vascular anatomy. RADIATION DOSE REDUCTION: This exam was performed according to the departmental dose-optimization program which includes automated exposure control, adjustment of the mA and/or kV according to patient size and/or use of iterative reconstruction technique. CONTRAST:  75mL OMNIPAQUE IOHEXOL 350 MG/ML SOLN COMPARISON:  Same day chest radiograph, CT chest dated 07/02/2023, 06/28/2022 FINDINGS: Cardiovascular: The study is high quality for the evaluation of pulmonary embolism. There are no  filling defects in the central, lobar, segmental or subsegmental pulmonary artery branches to suggest  acute pulmonary embolism. Great vessels are normal in course and caliber. Normal heart size. No significant pericardial fluid/thickening. Coronary artery calcifications and aortic atherosclerosis. Mediastinum/Nodes: Imaged thyroid gland without nodules meeting criteria for imaging follow-up by size. Normal esophagus. 10 mm left hilar lymphadenopathy (4:51). Lungs/Pleura: The central airways are patent. Trace layering secretions within the trachea extending into bilateral lower lobe bronchi. Moderate bronchial wall thickening of the left lower lobe. Similar upper lung predominant centrilobular and paraseptal emphysema, right-greater-than-left. Unchanged right upper lobe calcified granulomata. New multifocal ground-glass opacities in the lingula and medial right middle and lower lobes. New peripheral predominant nodularity, ground-glass opacities, and irregular consolidation involving the left lower lobe. Example, subpleural left lower lobe nodule measures 2.1 x 1.2 cm (6:100). No pneumothorax. No pleural effusion. Upper abdomen: Unchanged subcentimeter hypodensity in the peripheral upper spleen appears unchanged compared to 06/28/2022, likely benign. Musculoskeletal: No acute or abnormal lytic or blastic osseous lesions. Unchanged superior endplate deformity of T9 and wedging of T11. Review of the MIP images confirms the above findings. IMPRESSION: 1. No evidence of pulmonary embolism. 2. New peripheral predominant nodularity, ground-glass opacities, and irregular consolidation involving the left lower lobe, likely infectious/inflammatory. Additional new multifocal ground-glass opacities in the lingula and medial right middle and lower lobes, also likely infectious/inflammatory. 3. Mild left hilar lymphadenopathy, likely reactive. 4. Aortic Atherosclerosis (ICD10-I70.0) and Emphysema (ICD10-J43.9). Coronary artery  calcifications. Assessment for potential risk factor modification, dietary therapy or pharmacologic therapy may be warranted, if clinically indicated. Electronically Signed   By: Agustin Cree M.D.   On: 08/08/2023 13:33   DG Chest 2 View Result Date: 08/08/2023 CLINICAL DATA:  Suspected sepsis.  Tachypnea.  Productive cough. EXAM: CHEST - 2 VIEW COMPARISON:  07/26/2023. FINDINGS: Essentially stable exam. Redemonstration of increased interstitial markings, which corresponds to emphysematous changes and fibrosis/scarring on the recent chest CT scan. There are stable 3 calcified granulomas overlying the right upper lung zone. There are nonspecific opacities overlying the right lower lung zone, laterally which also appear grossly similar to the prior CT scan. No acute dense consolidation or lung collapse seen. Correlate clinically to determine the need for additional imaging with chest CT scan. Bilateral costophrenic angles are clear. Stable cardio-mediastinal silhouette. No acute osseous abnormalities. The soft tissues are within normal limits. IMPRESSION: *Chronic nonspecific opacities throughout bilateral lungs, as discussed above. No acute dense consolidation or lung collapse seen. Correlate clinically to determine the need for additional imaging with chest CT scan. Electronically Signed   By: Jules Schick M.D.   On: 08/08/2023 11:47   EKG: Independently reviewed. Sinus tachycardia   Assessment/Plan Principal Problem:   COPD with acute exacerbation ([name]) Active Problems:   Muscle weakness (generalized)   Gastroesophageal reflux disease   Thrombocytopenia ([name])   HTN (hypertension)   Dyslipidemia   Type 2 diabetes mellitus without complication, without long-term current use of insulin ([name])   Liver fibrosis   NASH (nonalcoholic steatohepatitis)   COPD GOLD III   Upper airway cough syndrome   Chronic respiratory failure with hypoxia ([name])   Acute on chronic respiratory failure ([name])   Former  cigarette smoker   Chronic maxillary sinusitis   Chronic ethmoidal sinusitis   Multiple pulmonary nodules determined by computed tomography of lung   Leukocytosis   Sinus tachycardia   Acute respiratory failure with hypoxia  - secondary to COPD exacerbation  - failed outpatient management - increasing home oxygen requirement noted - ordered nightly and PRN bipap therapy - admitted to stepdown ICU given  hypotension  Hypotension  - secondary to BP meds taken today and lasix taken today - bolus with IV fluid - stepdown ICU for closer monitoring - reduced dose of diltiazem CD, may need to hold if persists  Atypical pneumonia  - seen on CTA chest  - continue ceftriaxone and azithromycin  - follow blood cultures  Leukocytosis - leukemoid reaction from recent steroids   Sinus tachycardia  - secondary to bronchodilators and illness - treating supportively - reduced diltiazem CD dose in setting of hypotension   NASH liver disease Liver fibrosis Chronic thrombocytopenia  - monitor CBC closely  - hold enoxaparin for platelet less than 100  Type 2 DM  - anticipating some steroid induced hyperglycemia - added SSI and prandial coverage - frequent CBG monitoring ordered  GERD - pantoprazole ordered for GI protection   Critical Care Procedure Note Authorized and Performed by: Maryln Manuel MD  Total Critical Care time:  70 mins Due to a high probability of clinically significant, life threatening deterioration, the patient required my highest level of preparedness to intervene emergently and I personally spent this critical care time directly and personally managing the patient.  This critical care time included obtaining a history; examining the patient, pulse oximetry; ordering and review of studies; arranging urgent treatment with development of a management plan; evaluation of patient's response of treatment; frequent reassessment; and discussions with other providers.  This  critical care time was performed to assess and manage the high probability of imminent and life threatening deterioration that could result in multi-organ failure.  It was exclusive of separately billable procedures and treating other patients and teaching time.   DVT prophylaxis: enoxaparin   Code Status: Full   Family Communication:   Disposition Plan:   Consults called:   Admission status: INP  Level of care: Stepdown Standley Dakins MD [facility] How to contact the [facility] Attending or Consulting provider 7A - 7P or covering provider during after hours 7P -7A, for this patient?  Check the care team in [facility] and look for a) attending/consulting [facility] provider listed and b) the [facility] team listed Log into [email or web address] and use [place]'s universal password to access. If you do not have the password, please contact the hospital operator. Locate the [facility] provider you are looking for under [facility] and page to a number that you can be directly reached. If you still have difficulty reaching the provider, please page the [facility] (Director on Call) for the Hospitalists listed on amion for assistance.   If 7PM-7AM, please contact night-coverage [email or web address] Password [facility]  08/08/2023, 4:34 PM

## 2023-08-08 NOTE — ED Notes (Signed)
Patient transported to X-ray

## 2023-08-08 NOTE — ED Notes (Signed)
Pt aware that we need a UA. Pt given a urinal at bedside.

## 2023-08-08 NOTE — Progress Notes (Signed)
RT Note:@1930  Spoke with pt about at bedtime Bipap order. Pt states he does not wear CPAP/BiPap at home. Pt states he has worn in the past for increased WOB but his breathing feels better now and he doesn't feel he needs it. 2300: Checked on Pt for need of Bipap, Pt resting comfortably on 4L [place] SpO2 94% RR 16-18  no distress noted.

## 2023-08-08 NOTE — Hospital Course (Signed)
68 year old male former longtime smoker with Gold III COPD, chronic hypoxic respiratory failure on 3-4 L/min of oxygen, type 2 diabetes mellitus, hypertension, NASH liver disease, liver fibrosis,  chronic thrombocytopenia, dyslipidemia, dysrhythmia, tachycardia, chronic sinusitis, generalized weakness who was seen at his pulmonary clinic today and subsequently sent to ED with concern for sepsis.  He was following up from 07/26/2023 office visit where he was treated for acute COPD exacerbation with prednisone and levofloxacin.  He returned today with symptoms of worsening shortness of breath associated with fever and nausea rattling cough with orange sputum production he was hypoxic at 84% on 3 L/min oxygen.  He appeared weak and he was also noted to be tachycardic and was sent to the ED.  His workup in the ED was negative for pulmonary embolus.  The CTA chest did show findings of an atypical pneumonia and advanced COPD/emphysema.  Patient reported having some loose stools.  Patient denies hemoptysis.  Admission requested for further management of acute on chronic respiratory failure and COPD exacerbation that has failed outpatient management.

## 2023-08-08 NOTE — Progress Notes (Signed)
[name], male    DOB: [birth year]     MRN: [id number]   Brief patient profile:  22 yowm MM/quit smoking 2012 with COPD GOLD 3 criteria 06/17/20 previously followed by [name] referred back to pulmonary clinic 12/09/2019 in [place].   History of Present Illness  12/09/2019  Pulmonary/ 1st office eval/[name]  symbicort 160/spiriva  Chief Complaint  Patient presents with   Pulmonary Consult    Former patient of [name].  Breathing has been worse over the past few wks. Recently treated with Doxy per PCP for COPD exacerbation. He is occ coughing up some green sputum.   flare 11/23/19  rx doxy 5/19 x 7 days  Dyspnea:  Baseline uses HC parking / MMRC3 = can't walk 100 yards even at a slow pace at a flat grade s stopping due to sob  On 2lpm  Cough: improved less purulent purulent  Sleep: wakes up coughing since onset /sometimes uses saba proair  SABA use: rarely daytime rec Continue protonix 40 mg Take 30-60 min before first meal of the day  GERD diet  Plan A = Automatic = Always=    Budesonide/formoterol (symbicort ) 2 puffs first thing in am and 12 hours later and use spiriva x 2 pffs after the morning symbicort (ok to change back to the powder after 2 weeks but will need to stop it if you start coughing  Work on inhaler technique  Plan B = Backup (to supplement plan A, not to replace it) Only use your albuterol inhaler as a rescue medication    Plan C = Crisis (instead of Plan B   Make sure you check your oxygen saturations at highest level of activity to be sure it stays over 90%   Prednisone 10 mg take  4 each am x 2 days,   2 each am x 2 days,  1 each am x 2 days and stop  For cough > mucinex dm 1200 mg every 12 hours as needed  Would wait another 2 weeks before you get your first shot for covid 19    [name] eval 08/01/20 rec ST eval done 08/05/20: low risk asp SLP Diet Recommendations Regular solids;Thin liquid  Liquid Administration via Straw;Cup  Medication Administration  Whole meds with liquid  Compensations Slow rate;Small sips/bites  Postural Changes Seated upright at 90 degrees;Remain semi-upright after after feeds/meals (Comment)     [name] 03/02/22 FESS   04/05/2022  f/u ov/[place] office/[name] re: GOLD 3 copd maint on symb/spiriva = 240 dollars Chief Complaint  Patient presents with   Follow-up    Saw Christopher. Suszanne [name] and had surgery and is doing much better with his breathing   Dyspnea:  mailbox and back using 02 sometimes/  Cough: overall better since sinus surgery  Sleeping: noct better on bed blocks  SABA use: much less hfa/ never neb  02: 3lpm hs / sitting  and walks off it rec Plan A = Automatic = Always=    Breztri Take 2 puffs first thing in am and then another 2 puffs about 12 hours later.   Plan B = Backup (to supplement plan A, not to replace it) Only use your albuterol inhaler as a rescue medication  Plan C = Crisis (instead of Plan B but only if Plan B stops working) - only use your albuterol nebulizer if you first try Plan B and it fails to help Prednisone 10 mg take  4 each am x 2 days,   2  each am x 2 days,  1 each am x 2 days and stop  For cough mucinex dm 1200 mg every 12 hours as needed  Pantoprazole (protonix) 40 mg   Take  30-60 min before first meal of the day and Pepcid (famotidine)  20 mg (OTC) after supper until return to office GERD diet reviewed, bed blocks rec  If cough not better add another gabapentin 300 mg in am   Make sure you check your oxygen saturation  AT  your highest level of activity (not after you stop)   to be sure it stays over 90%       03/07/2023  f/u ov/[place] office/[name] re: GOLD 3 / 02 hs and prn  MPNS  maint on breztri  / not taking pepcid any more Chief Complaint  Patient presents with   COPD    GOLD III  Dyspnea:  mb x 300 ft flat can do s stopping  Cough: worse at hs and in am p stirring but no excess / purulent sputum Sleeping: bed blocks and pillows s resp cc  SABA use: hfa x once a week/ neb  hardly any  02: 3lpm at hs , prn daytime  Lung cancer screening: due 03/2023  Rec Add pepcid 20 mg an hour before bedtime (automatically) Make sure you check your oxygen saturation  AT  your highest level of activity (not after you stop)   to be sure it stays over 90%  Also  Ok to try albuterol 15 min before an activity (on alternating days)  that you know would usually make you short of breath      Had LDSCT in Sept 2024 "? During [facility] {"  05/01/2023  f/u ov/[place] office/[name] re: COPD GOLD 3/ 02 hs and prn  maint on Breztri   Chief Complaint  Patient presents with   COPD    Gold III  Dyspnea:  mb and back sometimes s 02  Cough:resolved p abx per [name]  Sleeping:  bed blocks/ 3 pillows s resp cc    resp cc  SABA use: 2 x this  02: 3lpm at hs  Rec For cough/ congestion > mucinex or mucinex dm up to maximum of  1200 mg every 12 hours as needed  We can order best fit for portable 02 thru [employer] Remember to make sure you check your oxygen saturation  AT  your highest level of activity (not after you stop)   to be sure it stays over 90%    Admission date:  06/08/2023     Discharge Date:  06/11/2023    Discharge Diagnosis  Acute on chronic respiratory failure with hypoxia ([name]) [J96.21] Pneumonia due to infectious organism, unspecified laterality, unspecified part of lung [J18.9]     Principal Problem:   Acute on chronic respiratory failure with hypoxia ([name])   Gastroesophageal reflux disease   HTN (hypertension)   Type 2 diabetes mellitus without complication, without long-term current use of insulin ([name])   COPD with acute exacerbation ([name])   CAP (community acquired pneumonia)   AKI (acute kidney injury) ([name])     Never got back to baseline since d/c 06/11/23 in terms of sob/cough   07/26/2023  f/u ov/[place] office/[name] re: GOLD 3 / 02 HS  maint on breztri   Chief Complaint  Patient presents with   Follow-up  Worse since finished prednisone  x around a week prior  to OV  more congested / green mucus  ? Best rx  was levaquin per wife  Dyspnea:  room to room since Jul 12 2023 mostly tired /some presyncope as well standing / eating and drinking ok  Cough:worse at hs and off and on all night  Sleeping: bed is flat with bunch of pillows    SABA use: last used neb> 12 h prior to OV  and no hfa on day of ov either 02: 3lpm 24/7  sometimes 4lpm but not really titrating to sats  Rec Levaquin 500 mg daily x 10 days  - stop if have any pain ankle tendons  Prednisone 10 mg x 2 each am until better then  1 daily x 5 days and stop  Stop losartan for now  We may need to stop your  ACTOS in future as it may be contributing with your breathing problems  Please schedule a follow up office visit in  2 weeks, sooner if needed Late add:   next step may be  collect mucus for opportunistic gnr and atypical TB - ask about flutter use next ov also Bring all meds next ov / consider addison's from freq steroid exp    08/08/2023  f/u ov/[place] office/Jaydy Fitzhenry re: GOLD 3 copd  maint on breztri / acutely ill this am but came in for f/u of  of last ov/ had been feeling better.  Chief Complaint  Patient presents with   Follow-up    2 week follow up   Dyspnea:  worse this am assoc with fever/ nausea  Cough: rattling/ orange   02: 84% on 3 lpm      Past Medical History:  Diagnosis Date   Anxiety    Asthma    Chest pain    a. 2003 Cath: nl cors.   COPD (chronic obstructive pulmonary disease) ([name])    Depression    Diabetes mellitus    Dyslipidemia    Dysrhythmia    Fracture 12/15   lower back "L-1"   GERD (gastroesophageal reflux disease)    Hypertension    PONV (postoperative nausea and vomiting)    Splenomegaly        Objective:    Wt   08/08/2023        167  07/26/2023        162  05/01/2023      170   03/07/2023        167   09/06/2022        163   04/05/2022        156 12/08/2021          164 09/01/2021        167  06/06/2021      162 12/08/2020           163 09/15/2020        154  05/23/2020      166 04/28/2020      166   12/30/19 162 lb (73.5 kg)  12/21/19 162 lb (73.5 kg)  12/09/19 162 lb (73.5 kg)   Vital signs reviewed  08/08/2023  - Note at rest 02 sats  84% on 3lpm   General appearance:    acutely ill appearing though still ambulatory   HEENT :  Oropharynx  clear      NECK :  without JVD/Nodes/TM/ nl carotid upstrokes bilaterally   LUNGS: no acc muscle use,  Mod barrel  contour chest wall with bilateral junky insp/exp rhonchi and  without cough on insp or exp maneuvers and mod  Hyperresonant  to  percussion bilaterally     CV:  RRR  no s3 or murmur or increase in P2, and no edema   ABD:  soft and nontender with pos mid insp Hoover's  in the supine position. No bruits or organomegaly appreciated, bowel sounds nl  MS:   Ext warm without deformities or   obvious joint restrictions , calf tenderness, cyanosis or clubbing  SKIN: warm and dry without lesions    NEURO:  alert, approp, nl sensorium with  no motor or cerebellar deficits apparent.                        Assessment

## 2023-08-08 NOTE — Assessment & Plan Note (Addendum)
Started on 02 around 2019 As of 02/03/2020  2lpm 24/7  -  02/03/2020 referred to [facility] for Best fit for amb 02 > 3lpm pulsed tank -  04/28/2020   Walked 3lpm pulsed tank  approx   400 ft  @ moderate pace  stopped due to  Sob/tired  with sats still 95%    -  09/15/2020   Walked 3lpm cont approx   200 ft  @ slow/slt undsteady  pace  stopped due to  Sob/fatigue   With sats 91% at rest sats were 86% RA   12/08/2020   Walked RA  approx   500 ft  @ moderate pace  stopped due to  End of study, a little sob, sats still 90%  - 04/05/2022 patient walked at a moderate pace on room air all three laps @ 150 ft each. Desat to 87% at end of third lap. Patient placed back on 3LO2 cont at end of walk  - 09/06/2022   Walked on 2lpm cont   x  3  lap(s) =  approx 450  ft  @ moderate pace, stopped due to end of study  with lowest 02 sats 93%   - 03/07/2023   Walked on 3lpm   x  2  lap(s) =  approx 300  ft  @ nl  pace, stopped due to sob with lowest 02 sats 88%   -  05/01/2023   Walked on 2lpom con  x  3  lap(s) =  approx 450  ft  @ mod to slow pace, stopped due to sob  with lowest 02 sats 91%      Desaturating at rest now on 3lpm cont > increased to 4 and transport to ER by W/c on 4lpm s interruption of 02 delivery.     Total time for H and P, chart review, counseling,  and generating customized AVS unique to this office visit / same day charting = 32 min

## 2023-08-08 NOTE — ED Triage Notes (Signed)
Pt arrived via wheelchair from Pulmonology appoint this morning due to possible sepsis. Pt tachycardic, tachypnic and febril in Triage. Pt has productive cough, presents on 4L Nasal Cannula.

## 2023-08-08 NOTE — Assessment & Plan Note (Signed)
Quit smoking 2012/MM - alpha one AT screen  01/31/17  MM  Level 169 - PFT's  06/18/2019  FEV1 1.12 (35 % ) ratio 0.46  p 0 % improvement from saba p ? prior to study with DLCO  11.91 (48%) corrects to 3.18 (754%)  for alv volume and FV curve classic concavity   - [name] 03/02/22 FESS  - 03/07/2023  continue breztri - 05/01/2023  After extensive coaching inhaler device,  effectiveness =    85% hfa/ continue breztri and approp saba   Acute decompensation / febrile, suggests pna or viremia > to ER for eval

## 2023-08-08 NOTE — Assessment & Plan Note (Addendum)
Just finished a round of prednisone and levaquin and now acutely ill this am with sob/cough/ congestion/nausea but no vomiting cp or abd pain   >>> transport by w/c to ER for sepsis w/u.   Discussed in detail all the  indications, usual  risks and alternatives  relative to the benefits with patient who agrees to proceed with w/u as outlined.

## 2023-08-09 ENCOUNTER — Encounter (HOSPITAL_COMMUNITY): Payer: Self-pay | Admitting: Family Medicine

## 2023-08-09 DIAGNOSIS — R058 Other specified cough: Secondary | ICD-10-CM | POA: Diagnosis not present

## 2023-08-09 DIAGNOSIS — I10 Essential (primary) hypertension: Secondary | ICD-10-CM | POA: Diagnosis not present

## 2023-08-09 DIAGNOSIS — Z1152 Encounter for screening for COVID-19: Secondary | ICD-10-CM | POA: Diagnosis not present

## 2023-08-09 DIAGNOSIS — J449 Chronic obstructive pulmonary disease, unspecified: Secondary | ICD-10-CM

## 2023-08-09 DIAGNOSIS — T380X5A Adverse effect of glucocorticoids and synthetic analogues, initial encounter: Secondary | ICD-10-CM | POA: Diagnosis not present

## 2023-08-09 DIAGNOSIS — T461X5A Adverse effect of calcium-channel blockers, initial encounter: Secondary | ICD-10-CM | POA: Diagnosis not present

## 2023-08-09 DIAGNOSIS — D72823 Leukemoid reaction: Secondary | ICD-10-CM | POA: Diagnosis not present

## 2023-08-09 DIAGNOSIS — R000 Tachycardia, unspecified: Secondary | ICD-10-CM | POA: Diagnosis not present

## 2023-08-09 DIAGNOSIS — E785 Hyperlipidemia, unspecified: Secondary | ICD-10-CM | POA: Diagnosis not present

## 2023-08-09 DIAGNOSIS — Z87891 Personal history of nicotine dependence: Secondary | ICD-10-CM | POA: Diagnosis not present

## 2023-08-09 DIAGNOSIS — I2510 Atherosclerotic heart disease of native coronary artery without angina pectoris: Secondary | ICD-10-CM | POA: Diagnosis not present

## 2023-08-09 DIAGNOSIS — R0602 Shortness of breath: Secondary | ICD-10-CM | POA: Diagnosis not present

## 2023-08-09 DIAGNOSIS — J189 Pneumonia, unspecified organism: Secondary | ICD-10-CM | POA: Diagnosis not present

## 2023-08-09 DIAGNOSIS — J440 Chronic obstructive pulmonary disease with acute lower respiratory infection: Secondary | ICD-10-CM | POA: Diagnosis not present

## 2023-08-09 DIAGNOSIS — J320 Chronic maxillary sinusitis: Secondary | ICD-10-CM | POA: Diagnosis not present

## 2023-08-09 DIAGNOSIS — J432 Centrilobular emphysema: Secondary | ICD-10-CM | POA: Diagnosis not present

## 2023-08-09 DIAGNOSIS — E1165 Type 2 diabetes mellitus with hyperglycemia: Secondary | ICD-10-CM | POA: Diagnosis not present

## 2023-08-09 DIAGNOSIS — J9601 Acute respiratory failure with hypoxia: Secondary | ICD-10-CM | POA: Diagnosis present

## 2023-08-09 DIAGNOSIS — D696 Thrombocytopenia, unspecified: Secondary | ICD-10-CM | POA: Diagnosis not present

## 2023-08-09 DIAGNOSIS — J439 Emphysema, unspecified: Secondary | ICD-10-CM | POA: Diagnosis not present

## 2023-08-09 DIAGNOSIS — R918 Other nonspecific abnormal finding of lung field: Secondary | ICD-10-CM | POA: Diagnosis not present

## 2023-08-09 DIAGNOSIS — R531 Weakness: Secondary | ICD-10-CM | POA: Diagnosis not present

## 2023-08-09 DIAGNOSIS — Z79899 Other long term (current) drug therapy: Secondary | ICD-10-CM | POA: Diagnosis not present

## 2023-08-09 DIAGNOSIS — T501X5A Adverse effect of loop [high-ceiling] diuretics, initial encounter: Secondary | ICD-10-CM | POA: Diagnosis not present

## 2023-08-09 DIAGNOSIS — R0682 Tachypnea, not elsewhere classified: Secondary | ICD-10-CM | POA: Diagnosis not present

## 2023-08-09 DIAGNOSIS — R590 Localized enlarged lymph nodes: Secondary | ICD-10-CM | POA: Diagnosis not present

## 2023-08-09 DIAGNOSIS — K7581 Nonalcoholic steatohepatitis (NASH): Secondary | ICD-10-CM | POA: Diagnosis not present

## 2023-08-09 DIAGNOSIS — I952 Hypotension due to drugs: Secondary | ICD-10-CM | POA: Diagnosis not present

## 2023-08-09 DIAGNOSIS — J322 Chronic ethmoidal sinusitis: Secondary | ICD-10-CM | POA: Diagnosis not present

## 2023-08-09 DIAGNOSIS — K219 Gastro-esophageal reflux disease without esophagitis: Secondary | ICD-10-CM | POA: Diagnosis not present

## 2023-08-09 DIAGNOSIS — M6281 Muscle weakness (generalized): Secondary | ICD-10-CM | POA: Diagnosis not present

## 2023-08-09 DIAGNOSIS — K7400 Hepatic fibrosis, unspecified: Secondary | ICD-10-CM | POA: Diagnosis not present

## 2023-08-09 DIAGNOSIS — K7460 Unspecified cirrhosis of liver: Secondary | ICD-10-CM | POA: Diagnosis not present

## 2023-08-09 DIAGNOSIS — J9621 Acute and chronic respiratory failure with hypoxia: Secondary | ICD-10-CM | POA: Diagnosis not present

## 2023-08-09 DIAGNOSIS — Z9981 Dependence on supplemental oxygen: Secondary | ICD-10-CM | POA: Diagnosis not present

## 2023-08-09 DIAGNOSIS — J441 Chronic obstructive pulmonary disease with (acute) exacerbation: Secondary | ICD-10-CM | POA: Diagnosis not present

## 2023-08-09 LAB — GLUCOSE, CAPILLARY
Glucose-Capillary: 276 mg/dL — ABNORMAL HIGH (ref 70–99)
Glucose-Capillary: 262 mg/dL — ABNORMAL HIGH (ref 70–99)
Glucose-Capillary: 232 mg/dL — ABNORMAL HIGH (ref 70–99)
Glucose-Capillary: 211 mg/dL — ABNORMAL HIGH (ref 70–99)
Glucose-Capillary: 356 mg/dL — ABNORMAL HIGH (ref 70–99)

## 2023-08-09 LAB — CBC WITH DIFFERENTIAL/PLATELET
Abs Immature Granulocytes: 0.05 10*3/uL (ref 0.00–0.07)
Basophils Absolute: 0 10*3/uL (ref 0.0–0.1)
Basophils Relative: 0 %
Eosinophils Absolute: 0 10*3/uL (ref 0.0–0.5)
Eosinophils Relative: 0 %
HCT: 37.7 % — ABNORMAL LOW (ref 39.0–52.0)
Hemoglobin: 12.3 g/dL — ABNORMAL LOW (ref 13.0–17.0)
Immature Granulocytes: 1 %
Lymphocytes Relative: 8 %
Lymphs Abs: 0.6 10*3/uL — ABNORMAL LOW (ref 0.7–4.0)
MCH: 28.5 pg (ref 26.0–34.0)
MCHC: 32.6 g/dL (ref 30.0–36.0)
MCV: 87.3 fL (ref 80.0–100.0)
Monocytes Absolute: 0.2 10*3/uL (ref 0.1–1.0)
Monocytes Relative: 2 %
Neutro Abs: 7 10*3/uL (ref 1.7–7.7)
Neutrophils Relative %: 89 %
Platelets: 108 10*3/uL — ABNORMAL LOW (ref 150–400)
RBC: 4.32 MIL/uL (ref 4.22–5.81)
RDW: 15.4 % (ref 11.5–15.5)
WBC: 7.9 10*3/uL (ref 4.0–10.5)
nRBC: 0 % (ref 0.0–0.2)

## 2023-08-09 LAB — COMPREHENSIVE METABOLIC PANEL
ALT: 27 U/L (ref 0–44)
AST: 15 U/L (ref 15–41)
Albumin: 3 g/dL — ABNORMAL LOW (ref 3.5–5.0)
Alkaline Phosphatase: 54 U/L (ref 38–126)
Anion gap: 9 (ref 5–15)
BUN: 19 mg/dL (ref 8–23)
CO2: 30 mmol/L (ref 22–32)
Calcium: 8.8 mg/dL — ABNORMAL LOW (ref 8.9–10.3)
Chloride: 99 mmol/L (ref 98–111)
Creatinine, Ser: 0.79 mg/dL (ref 0.61–1.24)
GFR, Estimated: >60 mL/min (ref >60)
Glucose, Bld: 310 mg/dL — ABNORMAL HIGH (ref 70–99)
Potassium: 4.3 mmol/L (ref 3.5–5.1)
Sodium: 138 mmol/L (ref 135–145)
Total Bilirubin: 1.4 mg/dL — ABNORMAL HIGH (ref 0.0–1.2)
Total Protein: 6.3 g/dL — ABNORMAL LOW (ref 6.5–8.1)

## 2023-08-09 LAB — BRAIN NATRIURETIC PEPTIDE: B Natriuretic Peptide: 88 pg/mL (ref 0.0–100.0)

## 2023-08-09 LAB — MRSA NEXT GEN BY PCR, NASAL: MRSA by PCR Next Gen: DETECTED — AB

## 2023-08-09 LAB — MAGNESIUM: Magnesium: 1.7 mg/dL (ref 1.7–2.4)

## 2023-08-09 MED ORDER — METHYLPREDNISOLONE SODIUM SUCC 40 MG IJ SOLR: 40.0000 mg | Freq: Two times a day (BID) | INTRAMUSCULAR | Status: DC

## 2023-08-09 MED ORDER — METHYLPREDNISOLONE SODIUM SUCC 40 MG IJ SOLR
40.0000 mg | Freq: Every day | INTRAMUSCULAR | Status: DC
  Administered 2023-08-10: 40 mg via INTRAVENOUS
  Filled 2023-08-09: qty 1

## 2023-08-09 MED ORDER — LACTATED RINGERS IV BOLUS
500.0000 mL | Freq: Once | INTRAVENOUS | Status: AC
  Administered 2023-08-09: 500 mL via INTRAVENOUS

## 2023-08-09 MED ORDER — INSULIN ASPART 100 UNIT/ML IJ SOLN
10.0000 [IU] | Freq: Three times a day (TID) | INTRAMUSCULAR | Status: DC
  Administered 2023-08-09: 10 [IU] via SUBCUTANEOUS

## 2023-08-09 MED ORDER — CHLORHEXIDINE GLUCONATE CLOTH 2 % EX PADS
6.0000 | MEDICATED_PAD | Freq: Every day | CUTANEOUS | Status: DC
  Administered 2023-08-09: 6 via TOPICAL

## 2023-08-09 MED ORDER — MUPIROCIN 2 % EX OINT
TOPICAL_OINTMENT | Freq: Two times a day (BID) | CUTANEOUS | Status: DC
  Filled 2023-08-09: qty 22

## 2023-08-09 MED ORDER — SODIUM CHLORIDE 0.9 % IV BOLUS
500.0000 mL | Freq: Once | INTRAVENOUS | Status: AC
  Administered 2023-08-09: 500 mL via INTRAVENOUS

## 2023-08-09 NOTE — TOC Initial Note (Addendum)
Transition of Care [facility]) - Initial/Assessment Note    Patient Details  Name: [name] MRN: [id number] Date of Birth: 1955-12-16  [facility] CM/SW Contact:    Villa Herb, LCSWA Phone Number: 08/09/2023, 12:59 PM  Clinical Narrative:                 Pt is high risk for readmission. CSW spoke with pt to complete assessment. Pt lives with his spouse. Pt is independent in completing his ADLs. Pt drives when needed. Pt has a cane and walker to use if needed but typically ambulates without them. Pt has a nurse from [employer] that comes out monthly. Pt states he has home O2 supplied through [employer]. TOC to follow.   Expected Discharge Plan: Home/Self Care Barriers to Discharge: Continued Medical Work up   Patient Goals and CMS Choice Patient states their goals for this hospitalization and ongoing recovery are:: return home CMS Medicare.gov Compare Post Acute Care list provided to:: Patient Choice offered to / list presented to : Patient      Expected Discharge Plan and Services In-house Referral: Clinical Social Work Discharge Planning Services: CM Consult   Living arrangements for the past 2 months: Single Family Home                                      Prior Living Arrangements/Services Living arrangements for the past 2 months: Single Family Home Lives with:: Spouse Patient language and need for interpreter reviewed:: Yes Do you feel safe going back to the place where you live?: Yes      Need for Family Participation in Patient Care: Yes (Comment) Care giver support system in place?: Yes (comment) Current home services: DME Criminal Activity/Legal Involvement Pertinent to Current Situation/Hospitalization: No - Comment as needed  Activities of Daily Living   ADL Screening (condition at time of admission) Independently performs ADLs?: Yes (appropriate for developmental age) Is the patient deaf or have difficulty hearing?: No Does the patient  have difficulty seeing, even when wearing glasses/contacts?: No Does the patient have difficulty concentrating, remembering, or making decisions?: No  Permission Sought/Granted                  Emotional Assessment Appearance:: Appears stated age Attitude/Demeanor/Rapport: Engaged Affect (typically observed): Accepting Orientation: : Oriented to Self, Oriented to Place, Oriented to  Time, Oriented to Situation Alcohol / Substance Use: Not Applicable Psych Involvement: No (comment)  Admission diagnosis:  General weakness [R53.1] COPD with acute exacerbation ([name]) [J44.1] Acute on chronic respiratory failure ([name]) [J96.20] Pulmonary emphysema, unspecified emphysema type ([name]) [J43.9] Community acquired pneumonia, unspecified laterality [J18.9] Acute respiratory failure with hypoxia ([name]) [J96.01] Patient Active Problem List   Diagnosis Date Noted   Acute respiratory failure with hypoxia ([name]) 08/09/2023   Sepsis ([name]) 08/08/2023   Leukocytosis 08/08/2023   Sinus tachycardia 08/08/2023   Multiple pulmonary nodules determined by computed tomography of lung 07/26/2023   Orthostasis 07/26/2023   CAP (community acquired pneumonia) 06/08/2023   AKI (acute kidney injury) ([name]) 06/08/2023   Chronic maxillary sinusitis 04/15/2023   Chronic ethmoidal sinusitis 04/15/2023   Former cigarette smoker 09/06/2022   S/P functional endoscopic sinus surgery 03/02/2022   Hemorrhoids 07/20/2021   Rectal bleeding 07/20/2021   DOE (dyspnea on exertion) 06/06/2021   Dysphagia 10/12/2020   Acute on chronic respiratory failure ([name]) 09/03/2020   Septic shock ([name]) 08/18/2020  Lobar pneumonia ([name]) 05/25/2020   Community acquired pneumonia of left lower lobe of lung 05/23/2020   Acute on chronic respiratory failure with hypoxia ([name]) 05/23/2020   Chronic respiratory failure with hypoxia ([name]) 02/04/2020   Upper airway cough syndrome 12/10/2019   Hives 07/30/2018   Full body hives 07/29/2018    Idiopathic angioedema 07/29/2018   COPD GOLD III 07/29/2018   Mycobacterium avium infection ([name]) 12/18/2017   Goiter 10/25/2017   Suspected Mycobacterial infectious disease 10/25/2017   COPD with acute exacerbation ([name]) 10/19/2017   Dehydration 10/19/2017   NASH (nonalcoholic steatohepatitis) 08/05/2017   Liver fibrosis 01/31/2017   H/O adenomatous polyp of colon 07/05/2016   Hyperproteinemia 09/05/2014   HTN (hypertension) 10/14/2013   Dyslipidemia 10/14/2013   Type 2 diabetes mellitus without complication, without long-term current use of insulin ([name]) 10/14/2013   Leukopenia 08/08/2013   Thrombocytopenia ([name]) 08/08/2013   Lower abdominal pain 07/06/2013   Abnormal LFTs 07/06/2013   Gastroesophageal reflux disease 07/06/2013   Constipation 07/06/2013   Abdominal bloating 04/21/2013   Abdominal pain, generalized 04/21/2013   Non-alcoholic fatty liver disease 04/21/2013   Splenomegaly 04/21/2013   Lumbago 11/26/2011   Muscle weakness (generalized) 11/26/2011   Bilateral leg pain 11/06/2011   Back pain with radiation 11/06/2011   DDD (degenerative disc disease), lumbar 11/06/2011   PCP:  Carylon Perches, MD Pharmacy:   [id number] - [place], [place] - [address] AT [facility] [address] [place] [address] Phone: [phone] Fax: [phone]     Social Drivers of Health (SDOH) Social History: SDOH Screenings   Food Insecurity: No Food Insecurity (08/09/2023)  Housing: Low Risk  (08/09/2023)  Transportation Needs: No Transportation Needs (08/09/2023)  Utilities: Not At Risk (08/09/2023)  Alcohol Screen: Low Risk  (08/16/2020)  Depression (PHQ2-9): Low Risk  (08/16/2020)  Financial Resource Strain: Low Risk  (08/16/2020)  Physical Activity: Sufficiently Active (08/16/2020)  Social Connections: Moderately Integrated (08/09/2023)  Stress: No Stress Concern Present (08/16/2020)  Tobacco Use: Medium Risk (08/09/2023)   SDOH Interventions:     Readmission Risk  Interventions    08/09/2023   12:59 PM  Readmission Risk Prevention Plan  Transportation Screening Complete  HRI or Home Care Consult Complete  Social Work Consult for Recovery Care Planning/Counseling Complete  Palliative Care Screening Not Applicable  Medication Review Oceanographer) Complete

## 2023-08-09 NOTE — Evaluation (Signed)
Physical Therapy Evaluation Patient Details Name: [name] MRN: [id number] DOB: Feb 10, 1956 Today's Date: 08/09/2023  History of Present Illness  Per Christopher:JWJXBJ W [name] is a 68 year old male former longtime smoker with Gold III COPD, chronic hypoxic respiratory failure on 3-4 L/min of oxygen, type 2 diabetes mellitus, hypertension, NASH liver disease, liver fibrosis,  chronic thrombocytopenia, dyslipidemia, dysrhythmia, tachycardia, chronic sinusitis, generalized weakness who was seen at his pulmonary clinic today and subsequently sent to ED with concern for sepsis.  He was following up from 07/26/2023 office visit where he was treated for acute COPD exacerbation with prednisone and levofloxacin.  He returned today with symptoms of worsening shortness of breath associated with fever and nausea rattling cough with orange sputum production he was hypoxic at 84% on 3 L/min oxygen.  He appeared weak and he was also noted to be tachycardic and was sent to the ED.  His workup in the ED was negative for pulmonary embolus.  The CTA chest did show findings of an atypical pneumonia and advanced COPD/emphysema.  Patient reported having some loose stools.  Patient denies hemoptysis.  Admission requested for further management of acute on chronic respiratory failure and COPD exacerbation that has failed outpatient management.  Clinical Impression  Pt normally is on 3L O2 at home.  Pt states he has all equipment that he needs at home.  Pt was I in bed mobility.  Mod I with ambulation using a RW.  Pt able to ambulate 60 ft with 3L O2 with noted increased SOB but pt was able to talk and able to regain his normal breath in a minute.  Pt issues appear to be more respiratory than physical in nature.  Advised pt to walk up and down his hall at home once an hour to build up his activity toleranct.         If plan is discharge home, recommend the following: Help with stairs or ramp for entrance   Can travel by private  vehicle    yes    Equipment Recommendations None recommended by PT  Recommendations for Other Services   none    Functional Status Assessment Patient has had a recent decline in their functional status and demonstrates the ability to make significant improvements in function in a reasonable and predictable amount of time.     Precautions / Restrictions Precautions Precautions: None Restrictions Weight Bearing Restrictions Per Provider Order: No      Mobility  Bed Mobility Overal bed mobility: Independent                  Transfers Overall transfer level: Independent                      Ambulation/Gait Ambulation/Gait assistance: Modified independent (Device/Increase time) Gait Distance (Feet): 60 Feet Assistive device: Rolling walker (2 wheels) Gait Pattern/deviations: WFL(Within Functional Limits)               Pertinent Vitals/Pain Pain Assessment Pain Assessment: No/denies pain    Home Living Family/patient expects to be discharged to:: Private residence Living Arrangements: Spouse/significant other Available Help at Discharge: Family;Available 24 hours/day Type of Home: House Home Access: Stairs to enter Entrance Stairs-Rails: Right;Left;Can reach both Entrance Stairs-Number of Steps: 4   Home Layout: One level Home Equipment: Agricultural consultant (2 wheels);Cane - single point;Rollator (4 wheels);BSC/3in1;Shower seat - built in      Prior Function Prior Level of Function : Independent/Modified Independent  Extremity/Trunk Assessment        Lower Extremity Assessment Lower Extremity Assessment: Overall WFL for tasks assessed       Communication   Communication Communication: No apparent difficulties Cueing Techniques: Verbal cues  Cognition Arousal: Alert   Overall Cognitive Status: Within Functional Limits for tasks assessed                                                  Assessment/Plan    PT Assessment Patient does not need any further PT services  PT Problem List  Unsteady on feet       PT Treatment Interventions   Eval    PT Goals (Current goals can be found in the Care Plan section)  Acute Rehab PT Goals Patient Stated Goal: to go home PT Goal Formulation: With patient Time For Goal Achievement: 08/10/23 Potential to Achieve Goals: Good            AM-PAC PT "6 Clicks" Mobility  Outcome Measure Help needed turning from your back to your side while in a flat bed without using bedrails?: None Help needed moving from lying on your back to sitting on the side of a flat bed without using bedrails?: None Help needed moving to and from a bed to a chair (including a wheelchair)?: None Help needed standing up from a chair using your arms (e.g., wheelchair or bedside chair)?: None Help needed to walk in hospital room?: A Little Help needed climbing 3-5 steps with a railing? : A Little 6 Click Score: 22    End of Session Equipment Utilized During Treatment: Gait belt Activity Tolerance: Patient tolerated treatment well Patient left: in bed Nurse Communication: Mobility status PT Visit Diagnosis: Unsteadiness on feet (R26.81)    Time: 1610-9604 PT Time Calculation (min) (ACUTE ONLY): 18 min   Charges:   PT Evaluation $PT Eval Moderate Complexity: 1 Mod   PT General Charges $$ ACUTE PT VISIT: 1 Visit         Virgina Organ, PT CLT [phone]  08/09/2023, 2:22 PM

## 2023-08-09 NOTE — Progress Notes (Signed)
Lab result of patient positive for MRSA in the nares. Mupirocin ordered per protocol.

## 2023-08-09 NOTE — Progress Notes (Signed)
eLink Physician-Brief Progress Note Patient Name: [name] DOB: 01-May-1956 MRN: [id number]   Date of Service  08/09/2023  HPI/Events of Note  Patient admitted by the hospitalist service for acute on chronic respiratory failure secondary to acute exacerbation of COPD.  eICU Interventions  New Patient Evaluation.        [name] 08/09/2023, 1:19 AM

## 2023-08-09 NOTE — Care Management Obs Status (Signed)
MEDICARE OBSERVATION STATUS NOTIFICATION   Patient Details  Name: [name] MRN: [id number] Date of Birth: Aug 16, 1955   Medicare Observation Status Notification Given:  Yes    [name] 08/09/2023, 9:39 AM

## 2023-08-09 NOTE — Plan of Care (Signed)

## 2023-08-09 NOTE — Progress Notes (Signed)
Patient does not use BIPAP at home to sleep with and does not wish to wear one here. No unit in room at this time.

## 2023-08-09 NOTE — Progress Notes (Signed)
PROGRESS NOTE   [name]  ZOX:[id number] DOB: 26-Nov-1955 DOA: 08/08/2023 PCP: Carylon Perches, MD   Chief Complaint  Patient presents with   Shortness of Breath   Level of care: Med-Surg  Brief Admission History:  68 year old male former longtime smoker with Gold III COPD, chronic hypoxic respiratory failure on 3-4 L/min of oxygen, type 2 diabetes mellitus, hypertension, NASH liver disease, liver fibrosis,  chronic thrombocytopenia, dyslipidemia, dysrhythmia, tachycardia, chronic sinusitis, generalized weakness who was seen at his pulmonary clinic today and subsequently sent to ED with concern for sepsis.  He was following up from 07/26/2023 office visit where he was treated for acute COPD exacerbation with prednisone and levofloxacin.  He returned today with symptoms of worsening shortness of breath associated with fever and nausea rattling cough with orange sputum production he was hypoxic at 84% on 3 L/min oxygen.  He appeared weak and he was also noted to be tachycardic and was sent to the ED.  His workup in the ED was negative for pulmonary embolus.  The CTA chest did show findings of an atypical pneumonia and advanced COPD/emphysema.  Patient reported having some loose stools.  Patient denies hemoptysis.  Admission requested for further management of acute on chronic respiratory failure and COPD exacerbation that has failed outpatient management.   Assessment and Plan:  Acute respiratory failure with hypoxia  - secondary to COPD exacerbation  - failed outpatient management - increasing home oxygen requirement noted - continue nightly bipap therapy - transfer to floor today    Hypotension  - reduced home BP meds and stopped lasix today  - bolus with IV fluid - may have to add midodrine if persistent   Atypical pneumonia  - seen on CTA chest  - continue ceftriaxone and azithromycin  - follow blood cultures - no growth to date    Leukocytosis - leukemoid reaction from recent  steroids    Sinus tachycardia  - secondary to bronchodilators and illness - treating supportively - reduced diltiazem CD dose in setting of hypotension    NASH liver disease Liver fibrosis Chronic thrombocytopenia  - monitor CBC closely  - hold enoxaparin for platelet less than 100   Type 2 DM  - anticipating some steroid induced hyperglycemia - added SSI and prandial coverage - frequent CBG monitoring ordered  CBG (last 3)  Recent Labs    08/09/23 0252 08/09/23 0736 08/09/23 1117  GLUCAP 276* 262* 232*   GERD - pantoprazole ordered for GI protection   DVT prophylaxis: enoxaparin  Code Status: Full  Family Communication:  Disposition: anticipate home in 1-2 days if continues to improve    Consultants:   Procedures:   Antimicrobials:  AZM 1/30>>  CTX  1/30>>  Subjective: Patient says he is breathing a little better but still not back to his baseline.  He reports that he has no chest pain.  Objective: Vitals:   08/09/23 1252 08/09/23 1333 08/09/23 1355 08/09/23 1422  BP: (!) 89/49 (!) 102/58  (!) 99/54  Pulse:    (!) 109  Resp:    20  Temp:    98.3 F (36.8 C)  TempSrc:    Oral  SpO2:   94% 94%  Weight:      Height:        Intake/Output Summary (Last 24 hours) at 08/09/2023 1525 Last data filed at 08/09/2023 0800 Gross per 24 hour  Intake 2325.7 ml  Output 400 ml  Net 1925.7 ml   [employer]  08/08/23 1053  Weight: 75.7 kg   Examination:  General exam: Appears calm and comfortable  Respiratory system: better air movement some expiratory wheezing heard.  Cardiovascular system: normal S1 & S2 heard. No JVD, murmurs, rubs, gallops or clicks. No pedal edema. Gastrointestinal system: Abdomen is nondistended, soft and nontender. No organomegaly or masses felt. Normal bowel sounds heard. Central nervous system: Alert and oriented. No focal neurological deficits. Extremities: Symmetric 5 x 5 power. Skin: No rashes, lesions or ulcers. Psychiatry:  Judgement and insight appear normal. Mood & affect appropriate.   Data Reviewed: I have personally reviewed following labs and imaging studies  CBC: Recent Labs  Lab 08/08/23 1121 08/09/23 0538  WBC 12.5* 7.9  NEUTROABS 10.2* 7.0  HGB 13.8 12.3*  HCT 42.0 37.7*  MCV 85.0 87.3  PLT 128* 108*    Basic Metabolic Panel: Recent Labs  Lab 08/08/23 1121 08/09/23 0538  NA 136 138  K 4.1 4.3  CL 99 99  CO2 27 30  GLUCOSE 182* 310*  BUN 18 19  CREATININE 0.78 0.79  CALCIUM 9.4 8.8*  MG  --  1.7    CBG: Recent Labs  Lab 08/08/23 1717 08/08/23 2312 08/09/23 0252 08/09/23 0736 08/09/23 1117  GLUCAP 201* 334* 276* 262* 232*    Recent Results (from the past 240 hours)  Culture, blood (Routine x 2)     Status: None (Preliminary result)   Collection Time: 08/08/23 11:21 AM   Specimen: BLOOD  Result Value Ref Range Status   Specimen Description BLOOD BLOOD RIGHT HAND  Final   Special Requests   Final    BOTTLES DRAWN AEROBIC AND ANAEROBIC Blood Culture adequate volume   Culture   Final    NO GROWTH < 24 HOURS Performed at [facility], [address] [place], [place] [address]    Report Status PENDING  Incomplete  Culture, blood (Routine x 2)     Status: None (Preliminary result)   Collection Time: 08/08/23 11:21 AM   Specimen: BLOOD  Result Value Ref Range Status   Specimen Description BLOOD BLOOD LEFT ARM  Final   Special Requests   Final    BOTTLES DRAWN AEROBIC AND ANAEROBIC Blood Culture adequate volume   Culture   Final    NO GROWTH < 24 HOURS Performed at [facility], [address], [place], [place] [address]    Report Status PENDING  Incomplete  Resp panel by RT-PCR (RSV, Flu A&B, Covid) Anterior Nasal Swab     Status: None   Collection Time: 08/08/23 11:37 AM   Specimen: Anterior Nasal Swab  Result Value Ref Range Status   SARS Coronavirus 2 by RT PCR NEGATIVE NEGATIVE Final    Comment: (NOTE) SARS-CoV-2 target nucleic acids are NOT DETECTED.  The  SARS-CoV-2 RNA is generally detectable in upper respiratory specimens during the acute phase of infection. The lowest concentration of SARS-CoV-2 viral copies this assay can detect is 138 copies/mL. A negative result does not preclude SARS-Cov-2 infection and should not be used as the sole basis for treatment or other patient management decisions. A negative result may occur with  improper specimen collection/handling, submission of specimen other than nasopharyngeal swab, presence of viral mutation(s) within the areas targeted by this assay, and inadequate number of viral copies(<138 copies/mL). A negative result must be combined with clinical observations, patient history, and epidemiological information. The expected result is Negative.  Fact Sheet for Patients:  BloggerCourse.com  Fact Sheet for Healthcare Providers:  SeriousBroker.it  This test is no t yet approved or cleared by the [place] and  has been authorized for detection and/or diagnosis of SARS-CoV-2 by FDA under an Emergency Use Authorization (EUA). This EUA will remain  in effect (meaning this test can be used) for the duration of the COVID-19 declaration under Section 564(b)(1) of the Act, 21 U.S.C.section 360bbb-3(b)(1), unless the authorization is terminated  or revoked sooner.       Influenza A by PCR NEGATIVE NEGATIVE Final   Influenza B by PCR NEGATIVE NEGATIVE Final    Comment: (NOTE) The Xpert Xpress SARS-CoV-2/FLU/RSV plus assay is intended as an aid in the diagnosis of influenza from Nasopharyngeal swab specimens and should not be used as a sole basis for treatment. Nasal washings and aspirates are unacceptable for Xpert Xpress SARS-CoV-2/FLU/RSV testing.  Fact Sheet for Patients: BloggerCourse.com  Fact Sheet for Healthcare Providers: SeriousBroker.it  This test is not yet approved or  cleared by the [place] FDA and has been authorized for detection and/or diagnosis of SARS-CoV-2 by FDA under an Emergency Use Authorization (EUA). This EUA will remain in effect (meaning this test can be used) for the duration of the COVID-19 declaration under Section 564(b)(1) of the Act, 21 U.S.C. section 360bbb-3(b)(1), unless the authorization is terminated or revoked.     Resp Syncytial Virus by PCR NEGATIVE NEGATIVE Final    Comment: (NOTE) Fact Sheet for Patients: BloggerCourse.com  Fact Sheet for Healthcare Providers: SeriousBroker.it  This test is not yet approved or cleared by the [place] FDA and has been authorized for detection and/or diagnosis of SARS-CoV-2 by FDA under an Emergency Use Authorization (EUA). This EUA will remain in effect (meaning this test can be used) for the duration of the COVID-19 declaration under Section 564(b)(1) of the Act, 21 U.S.C. section 360bbb-3(b)(1), unless the authorization is terminated or revoked.  Performed at [facility], [address] [place], [place] [address]   MRSA Next Gen by PCR, Nasal     Status: Abnormal   Collection Time: 08/09/23 12:39 AM   Specimen: Nasal Mucosa; Nasal Swab  Result Value Ref Range Status   MRSA by PCR Next Gen DETECTED (A) NOT DETECTED Final    Comment: REPEATED TO VERIFY CRITICAL RESULT CALLED TO, READ BACK BY AND VERIFIED WITH: ASHLEY SHELTON AT [id number] BY A. SNYDER (NOTE) The GeneXpert MRSA Assay (FDA approved for NASAL specimens only), is one component of a comprehensive MRSA colonization surveillance program. It is not intended to diagnose MRSA infection nor to guide or monitor treatment for MRSA infections. Test performance is not FDA approved in patients less than 33 years old. Performed at [facility], [address], [place], [place] [address]      Radiology Studies: CT Angio Chest PE W/Cm &/Or Wo Cm Result Date:  08/08/2023 CLINICAL DATA:  Fever, tachycardia, tachypnea, and productive cough EXAM: CT ANGIOGRAPHY CHEST WITH CONTRAST TECHNIQUE: Multidetector CT imaging of the chest was performed using the standard protocol during bolus administration of intravenous contrast. Multiplanar CT image reconstructions and MIPs were obtained to evaluate the vascular anatomy. RADIATION DOSE REDUCTION: This exam was performed according to the departmental dose-optimization program which includes automated exposure control, adjustment of the mA and/or kV according to patient size and/or use of iterative reconstruction technique. CONTRAST:  75mL OMNIPAQUE IOHEXOL 350 MG/ML SOLN COMPARISON:  Same day chest radiograph, CT chest dated 07/02/2023, 06/28/2022 FINDINGS: Cardiovascular: The study is high quality for the evaluation of pulmonary embolism. There are no filling  defects in the central, lobar, segmental or subsegmental pulmonary artery branches to suggest acute pulmonary embolism. Great vessels are normal in course and caliber. Normal heart size. No significant pericardial fluid/thickening. Coronary artery calcifications and aortic atherosclerosis. Mediastinum/Nodes: Imaged thyroid gland without nodules meeting criteria for imaging follow-up by size. Normal esophagus. 10 mm left hilar lymphadenopathy (4:51). Lungs/Pleura: The central airways are patent. Trace layering secretions within the trachea extending into bilateral lower lobe bronchi. Moderate bronchial wall thickening of the left lower lobe. Similar upper lung predominant centrilobular and paraseptal emphysema, right-greater-than-left. Unchanged right upper lobe calcified granulomata. New multifocal ground-glass opacities in the lingula and medial right middle and lower lobes. New peripheral predominant nodularity, ground-glass opacities, and irregular consolidation involving the left lower lobe. Example, subpleural left lower lobe nodule measures 2.1 x 1.2 cm (6:100). No  pneumothorax. No pleural effusion. Upper abdomen: Unchanged subcentimeter hypodensity in the peripheral upper spleen appears unchanged compared to 06/28/2022, likely benign. Musculoskeletal: No acute or abnormal lytic or blastic osseous lesions. Unchanged superior endplate deformity of T9 and wedging of T11. Review of the MIP images confirms the above findings. IMPRESSION: 1. No evidence of pulmonary embolism. 2. New peripheral predominant nodularity, ground-glass opacities, and irregular consolidation involving the left lower lobe, likely infectious/inflammatory. Additional new multifocal ground-glass opacities in the lingula and medial right middle and lower lobes, also likely infectious/inflammatory. 3. Mild left hilar lymphadenopathy, likely reactive. 4. Aortic Atherosclerosis (ICD10-I70.0) and Emphysema (ICD10-J43.9). Coronary artery calcifications. Assessment for potential risk factor modification, dietary therapy or pharmacologic therapy may be warranted, if clinically indicated. Electronically Signed   By: Agustin Cree M.D.   On: 08/08/2023 13:33   DG Chest 2 View Result Date: 08/08/2023 CLINICAL DATA:  Suspected sepsis.  Tachypnea.  Productive cough. EXAM: CHEST - 2 VIEW COMPARISON:  07/26/2023. FINDINGS: Essentially stable exam. Redemonstration of increased interstitial markings, which corresponds to emphysematous changes and fibrosis/scarring on the recent chest CT scan. There are stable 3 calcified granulomas overlying the right upper lung zone. There are nonspecific opacities overlying the right lower lung zone, laterally which also appear grossly similar to the prior CT scan. No acute dense consolidation or lung collapse seen. Correlate clinically to determine the need for additional imaging with chest CT scan. Bilateral costophrenic angles are clear. Stable cardio-mediastinal silhouette. No acute osseous abnormalities. The soft tissues are within normal limits. IMPRESSION: *Chronic nonspecific  opacities throughout bilateral lungs, as discussed above. No acute dense consolidation or lung collapse seen. Correlate clinically to determine the need for additional imaging with chest CT scan. Electronically Signed   By: Jules Schick M.D.   On: 08/08/2023 11:47    Scheduled Meds:  arformoterol  15 mcg Nebulization BID   atorvastatin  40 mg Oral q AM   budesonide (PULMICORT) nebulizer solution  0.25 mg Nebulization BID   Chlorhexidine Gluconate Cloth  6 each Topical Daily   diltiazem  120 mg Oral q AM   enoxaparin (LOVENOX) injection  40 mg Subcutaneous Q24H   famotidine  20 mg Oral QHS   ferrous sulfate  325 mg Oral q AM   guaiFENesin  1,200 mg Oral BID   insulin aspart  0-20 Units Subcutaneous TID WC   insulin aspart  0-5 Units Subcutaneous QHS   insulin aspart  6 Units Subcutaneous TID WC   ipratropium-albuterol  3 mL Nebulization TID   [START ON 08/10/2023] methylPREDNISolone (SOLU-MEDROL) injection  40 mg Intravenous Daily   mupirocin ointment   Nasal BID   pantoprazole  40  mg Oral q AM   sertraline  50 mg Oral Daily   tamsulosin  0.4 mg Oral q AM   Continuous Infusions:  azithromycin 500 mg (08/09/23 1047)   cefTRIAXone (ROCEPHIN)  IV Stopped (08/08/23 1730)    LOS: 0 days   Time spent: 58 mins  Angle Karel Laural Benes, MD How to contact the [facility] Attending or Consulting provider 7A - 7P or covering provider during after hours 7P -7A, for this patient?  Check the care team in [facility] and look for a) attending/consulting [facility] provider listed and b) the [facility] team listed Log into [email or web address] to find provider on call.  Locate the [facility] provider you are looking for under [facility] and page to a number that you can be directly reached. If you still have difficulty reaching the provider, please page the [facility] (Director on Call) for the Hospitalists listed on amion for assistance.  08/09/2023, 3:25 PM

## 2023-08-10 DIAGNOSIS — J441 Chronic obstructive pulmonary disease with (acute) exacerbation: Secondary | ICD-10-CM | POA: Diagnosis not present

## 2023-08-10 DIAGNOSIS — J9621 Acute and chronic respiratory failure with hypoxia: Secondary | ICD-10-CM | POA: Diagnosis not present

## 2023-08-10 DIAGNOSIS — K7581 Nonalcoholic steatohepatitis (NASH): Secondary | ICD-10-CM | POA: Diagnosis not present

## 2023-08-10 DIAGNOSIS — Z87891 Personal history of nicotine dependence: Secondary | ICD-10-CM

## 2023-08-10 LAB — CBC WITH DIFFERENTIAL/PLATELET
Abs Immature Granulocytes: 0.11 10*3/uL — ABNORMAL HIGH (ref 0.00–0.07)
Basophils Absolute: 0 10*3/uL (ref 0.0–0.1)
Basophils Relative: 0 %
Eosinophils Absolute: 0 10*3/uL (ref 0.0–0.5)
Eosinophils Relative: 0 %
HCT: 37.6 % — ABNORMAL LOW (ref 39.0–52.0)
Hemoglobin: 11.8 g/dL — ABNORMAL LOW (ref 13.0–17.0)
Immature Granulocytes: 1 %
Lymphocytes Relative: 8 %
Lymphs Abs: 0.7 10*3/uL (ref 0.7–4.0)
MCH: 27.8 pg (ref 26.0–34.0)
MCHC: 31.4 g/dL (ref 30.0–36.0)
MCV: 88.5 fL (ref 80.0–100.0)
Monocytes Absolute: 0.5 10*3/uL (ref 0.1–1.0)
Monocytes Relative: 5 %
Neutro Abs: 7.8 10*3/uL — ABNORMAL HIGH (ref 1.7–7.7)
Neutrophils Relative %: 86 %
Platelets: 127 10*3/uL — ABNORMAL LOW (ref 150–400)
RBC: 4.25 MIL/uL (ref 4.22–5.81)
RDW: 15.2 % (ref 11.5–15.5)
WBC: 9.2 10*3/uL (ref 4.0–10.5)
nRBC: 0 % (ref 0.0–0.2)

## 2023-08-10 LAB — GLUCOSE, CAPILLARY
Glucose-Capillary: 316 mg/dL — ABNORMAL HIGH (ref 70–99)
Glucose-Capillary: 252 mg/dL — ABNORMAL HIGH (ref 70–99)
Glucose-Capillary: 240 mg/dL — ABNORMAL HIGH (ref 70–99)

## 2023-08-10 LAB — COMPREHENSIVE METABOLIC PANEL
ALT: 26 U/L (ref 0–44)
AST: 15 U/L (ref 15–41)
Albumin: 3.3 g/dL — ABNORMAL LOW (ref 3.5–5.0)
Alkaline Phosphatase: 58 U/L (ref 38–126)
Anion gap: 9 (ref 5–15)
BUN: 21 mg/dL (ref 8–23)
CO2: 27 mmol/L (ref 22–32)
Calcium: 8.9 mg/dL (ref 8.9–10.3)
Chloride: 102 mmol/L (ref 98–111)
Creatinine, Ser: 0.74 mg/dL (ref 0.61–1.24)
GFR, Estimated: >60 mL/min (ref >60)
Glucose, Bld: 331 mg/dL — ABNORMAL HIGH (ref 70–99)
Potassium: 4.2 mmol/L (ref 3.5–5.1)
Sodium: 138 mmol/L (ref 135–145)
Total Bilirubin: 0.9 mg/dL (ref 0.0–1.2)
Total Protein: 6.8 g/dL (ref 6.5–8.1)

## 2023-08-10 LAB — MAGNESIUM: Magnesium: 2.1 mg/dL (ref 1.7–2.4)

## 2023-08-10 MED ORDER — DOXYCYCLINE HYCLATE 100 MG PO CAPS: 100.0000 mg | ORAL_CAPSULE | Freq: Two times a day (BID) | ORAL | 0 refills | Status: AC

## 2023-08-10 MED ORDER — ALBUTEROL SULFATE (2.5 MG/3ML) 0.083% IN NEBU: 2.5000 mg | INHALATION_SOLUTION | RESPIRATORY_TRACT | 2 refills | Status: AC | PRN

## 2023-08-10 MED ORDER — DILTIAZEM HCL ER COATED BEADS 120 MG PO CP24: 120.0000 mg | ORAL_CAPSULE | Freq: Every morning | ORAL | 1 refills | Status: DC

## 2023-08-10 MED ORDER — PREDNISONE 20 MG PO TABS
ORAL_TABLET | ORAL | 0 refills | Status: DC
Start: 2023-08-11 — End: 2023-10-10

## 2023-08-10 MED ORDER — INSULIN ASPART 100 UNIT/ML IJ SOLN
12.0000 [IU] | Freq: Three times a day (TID) | INTRAMUSCULAR | Status: DC
  Administered 2023-08-10: 12 [IU] via SUBCUTANEOUS

## 2023-08-10 MED ORDER — GUAIFENESIN-CODEINE 100-10 MG/5ML PO SOLN: 5.0000 mL | Freq: Three times a day (TID) | ORAL | 0 refills | Status: DC | PRN

## 2023-08-10 MED ORDER — MELATONIN 3 MG PO TABS: 3.0000 mg | ORAL_TABLET | Freq: Every day | ORAL | Status: DC

## 2023-08-10 NOTE — Progress Notes (Signed)
   08/09/23 2106  Assess: MEWS Score  Temp 98.9 F (37.2 C)  BP 104/64  Pulse Rate (!) 111  Resp 20  SpO2 94 %  O2 Device Nasal Cannula  O2 Flow Rate (L/min) 3 L/min  Assess: MEWS Score  MEWS Temp 0  MEWS Systolic 0  MEWS Pulse 2  MEWS RR 0  MEWS LOC 0  MEWS Score 2  MEWS Score Color Yellow  Assess: if the MEWS score is Yellow or Red  Were vital signs accurate and taken at a resting state? Yes  Does the patient meet 2 or more of the SIRS criteria? Yes  Does the patient have a confirmed or suspected source of infection? Yes  MEWS guidelines implemented  No, previously yellow, continue vital signs every 4 hours  Notify: Charge Nurse/RN  Name of Charge Nurse/RN Notified Roque Lias, RN  Assess: SIRS CRITERIA  SIRS Temperature  0  SIRS Respirations  0  SIRS Pulse 1  SIRS WBC 0  SIRS Score Sum  1

## 2023-08-10 NOTE — Discharge Instructions (Signed)
 IMPORTANT INFORMATION: PAY CLOSE ATTENTION   PHYSICIAN DISCHARGE INSTRUCTIONS  Follow with Primary care provider  Carylon Perches, MD  and other consultants as instructed by your Hospitalist Physician  SEEK MEDICAL CARE OR RETURN TO EMERGENCY ROOM IF SYMPTOMS COME BACK, WORSEN OR NEW PROBLEM DEVELOPS   Please note: You were cared for by a hospitalist during your hospital stay. Every effort will be made to forward records to your primary care provider.  You can request that your primary care provider send for your hospital records if they have not received them.  Once you are discharged, your primary care physician will handle any further medical issues. Please note that NO REFILLS for any discharge medications will be authorized once you are discharged, as it is imperative that you return to your primary care physician (or establish a relationship with a primary care physician if you do not have one) for your post hospital discharge needs so that they can reassess your need for medications and monitor your lab values.  Please get a complete blood count and chemistry panel checked by your Primary MD at your next visit, and again as instructed by your Primary MD.  Get Medicines reviewed and adjusted: Please take all your medications with you for your next visit with your Primary MD  Laboratory/radiological data: Please request your Primary MD to go over all hospital tests and procedure/radiological results at the follow up, please ask your primary care provider to get all Hospital records sent to his/her office.  In some cases, they will be blood work, cultures and biopsy results pending at the time of your discharge. Please request that your primary care provider follow up on these results.  If you are diabetic, please bring your blood sugar readings with you to your follow up appointment with primary care.    Please call and make your follow up appointments as soon as possible.    Also Note the  following: If you experience worsening of your admission symptoms, develop shortness of breath, life threatening emergency, suicidal or homicidal thoughts you must seek medical attention immediately by calling 911 or calling your MD immediately  if symptoms less severe.  You must read complete instructions/literature along with all the possible adverse reactions/side effects for all the Medicines you take and that have been prescribed to you. Take any new Medicines after you have completely understood and accpet all the possible adverse reactions/side effects.   Do not drive when taking Pain medications or sleeping medications (Benzodiazepines)  Do not take more than prescribed Pain, Sleep and Anxiety Medications. It is not advisable to combine anxiety,sleep and pain medications without talking with your primary care practitioner  Special Instructions: If you have smoked or chewed Tobacco  in the last 2 yrs please stop smoking, stop any regular Alcohol  and or any Recreational drug use.  Wear Seat belts while driving.  Do not drive if taking any narcotic, mind altering or controlled substances or recreational drugs or alcohol.

## 2023-08-10 NOTE — Plan of Care (Signed)

## 2023-08-10 NOTE — Progress Notes (Signed)
PT Cancellation Note  Patient Details Name: [name] MRN: [id number] DOB: 04-09-1956   Cancelled Treatment:    Reason Eval/Treat Not Completed: Other (comment) (New PT order placed. Pt was evaluated yesterday and is modified independent for all activity. See previous note for mobility details. PT to signed off on new order.)   [name] 08/10/2023, 7:51 AM

## 2023-08-10 NOTE — Discharge Summary (Signed)
Physician Discharge Summary  [name] ZOX:[id number] DOB: 15-Apr-1956 DOA: 08/08/2023  PCP: Carylon Perches, MD Pulmonary: Sherene Sires   Admit date: 08/08/2023 Discharge date: 08/10/2023  Admitted From:   Home  Disposition:  Home   Recommendations for Outpatient Follow-up:  Follow up with PCP in 1 weeks Follow up with pulmonary in 1-2 weeks   Discharge Condition: STABLE   CODE STATUS: FULL DIET:  resume previous home diet    Brief Hospitalization Summary: Please see all hospital notes, images, labs for full details of the hospitalization. Admission provider HPI:  68 year old male former longtime smoker with Gold III COPD, chronic hypoxic respiratory failure on 3-4 L/min of oxygen, type 2 diabetes mellitus, hypertension, NASH liver disease, liver fibrosis,  chronic thrombocytopenia, dyslipidemia, dysrhythmia, tachycardia, chronic sinusitis, generalized weakness who was seen at his pulmonary clinic today and subsequently sent to ED with concern for sepsis.  He was following up from 07/26/2023 office visit where he was treated for acute COPD exacerbation with prednisone and levofloxacin.  He returned today with symptoms of worsening shortness of breath associated with fever and nausea rattling cough with orange sputum production he was hypoxic at 84% on 3 L/min oxygen.  He appeared weak and he was also noted to be tachycardic and was sent to the ED.  His workup in the ED was negative for pulmonary embolus.  The CTA chest did show findings of an atypical pneumonia and advanced COPD/emphysema.  Patient reported having some loose stools.  Patient denies hemoptysis.  Admission requested for further management of acute on chronic respiratory failure and COPD exacerbation that has failed outpatient management.  Hospital Course by problem list  Acute respiratory failure with hypoxia  - secondary to COPD exacerbation  - failed outpatient management - now back to home oxygen requirement baseline 3L/min    Hypotension - reduced home BP meds and stopped lasix today  - bolus with IV fluid - may have to add midodrine if persistent   Atypical pneumonia  - seen on CTA chest  - treated with ceftriaxone and azithromycin  - follow blood cultures - no growth to date - discharge on oral doxycycline    Leukocytosis - leukemoid reaction from steroids    Sinus tachycardia  - secondary to bronchodilators and illness - treated supportively - reduced diltiazem CD dose in setting of hypotension    NASH liver disease Liver fibrosis Chronic thrombocytopenia  - monitor CBC closely  - hold enoxaparin for platelet less than 100   Type 2 DM  - anticipated some steroid induced hyperglycemia - added SSI and prandial coverage - frequent CBG monitoring ordered  CBG (last 3)  Recent Labs (last 2 labs)       Recent Labs    08/09/23 0252 08/09/23 0736 08/09/23 1117  GLUCAP 276* 262* 232*      GERD - pantoprazole ordered for GI protection    Discharge Diagnoses:  Principal Problem:   COPD with acute exacerbation ([name]) Active Problems:   Muscle weakness (generalized)   Gastroesophageal reflux disease   Thrombocytopenia ([name])   HTN (hypertension)   Dyslipidemia   Type 2 diabetes mellitus without complication, without long-term current use of insulin ([name])   Liver fibrosis   NASH (nonalcoholic steatohepatitis)   COPD GOLD III   Upper airway cough syndrome   Chronic respiratory failure with hypoxia ([name])   Acute on chronic respiratory failure ([name])   Former cigarette smoker   Chronic maxillary sinusitis   Chronic ethmoidal sinusitis  Multiple pulmonary nodules determined by computed tomography of lung   Leukocytosis   Sinus tachycardia   Acute respiratory failure with hypoxia [facility])   Discharge Instructions:  Allergies as of 08/10/2023       Reactions   Lioresal [baclofen] Other (See Comments)   Lethargy     Zocor [simvastatin] Nausea Only        Medication List     TAKE  these medications    acetaminophen 325 MG tablet Commonly known as: TYLENOL Take 2 tablets (650 mg total) by mouth every 6 (six) hours as needed for mild pain (pain score 1-3) (or Fever >/= 101).   albuterol 108 (90 Base) MCG/ACT inhaler Commonly known as: VENTOLIN HFA Inhale 2 puffs into the lungs every 4 (four) hours as needed for wheezing or shortness of breath.   albuterol (2.5 MG/3ML) 0.083% nebulizer solution Commonly known as: PROVENTIL Take 3 mLs (2.5 mg total) by nebulization every 2 (two) hours as needed for wheezing or shortness of breath (cough).   ALPRAZolam 0.25 MG tablet Commonly known as: XANAX Take 0.25 mg by mouth 2 (two) times daily as needed for sleep or anxiety.   atorvastatin 40 MG tablet Commonly known as: LIPITOR Take 40 mg by mouth in the morning.   BENEFIBER PO Take 1 Capful by mouth daily.   Breztri Aerosphere 160-9-4.8 MCG/ACT Aero Generic drug: Budeson-Glycopyrrol-Formoterol INHALE 2 PUFFS INTO THE LUNGS TWICE A DAY   diltiazem 120 MG 24 hr capsule Commonly known as: CARDIZEM CD Take 1 capsule (120 mg total) by mouth in the morning. Start taking on: August 11, 2023 What changed:  medication strength how much to take   doxycycline 100 MG capsule Commonly known as: VIBRAMYCIN Take 1 capsule (100 mg total) by mouth 2 (two) times daily for 3 days.   famotidine 20 MG tablet Commonly known as: PEPCID TAKE 1 TABLET BY MOUTH EVERY DAY AFTER SUPPER   ferrous sulfate 325 (65 FE) MG tablet Take 325 mg by mouth in the morning.   fluticasone 50 MCG/ACT nasal spray Commonly known as: FLONASE Place 1 spray into both nostrils daily as needed for allergies or rhinitis.   furosemide 20 MG tablet Commonly known as: LASIX TAKE 1 TABLET BY MOUTH EVERY DAY   gabapentin 300 MG capsule Commonly known as: NEURONTIN Take 1 capsule (300 mg total) by mouth at bedtime.   glimepiride 1 MG tablet Commonly known as: AMARYL Take 1 mg by mouth daily with  breakfast.   guaiFENesin 600 MG 12 hr tablet Commonly known as: Mucinex Take 1 tablet (600 mg total) by mouth 2 (two) times daily.   guaiFENesin-codeine 100-10 MG/5ML syrup Take 5 mLs by mouth 3 (three) times daily as needed for cough.   metFORMIN 1000 MG tablet Commonly known as: GLUCOPHAGE Take 1,000 mg by mouth daily with supper.   multivitamin with minerals Tabs tablet Take 1 tablet by mouth at bedtime.   OXYGEN Inhale 3 L into the lungs continuous.   pantoprazole 40 MG tablet Commonly known as: PROTONIX Take 1 tablet (40 mg total) by mouth in the morning.   pioglitazone 15 MG tablet Commonly known as: ACTOS Take 15 mg by mouth daily.   predniSONE 20 MG tablet Commonly known as: DELTASONE Take 2 PO QAM x3days,1 PO QAM x3days Start taking on: August 11, 2023   pyridOXINE 50 MG tablet Commonly known as: B-6 Take 50 mg by mouth daily.   sertraline 50 MG tablet Commonly known as: ZOLOFT Take 1  tablet (50 mg total) by mouth daily.   spironolactone 25 MG tablet Commonly known as: ALDACTONE TAKE 1 TABLET (25 MG TOTAL) BY MOUTH DAILY.   tamsulosin 0.4 MG Caps capsule Commonly known as: FLOMAX Take 0.4 mg by mouth in the morning.   vitamin C 250 MG tablet Commonly known as: ASCORBIC ACID Take 250 mg by mouth in the morning.   Vitamin D3 25 MCG (1000 UT) Caps Take 1,000 Units by mouth in the morning.        Follow-up Information     Carylon Perches, MD. Schedule an appointment as soon as possible for a visit in 1 week(s).   Specialty: Internal Medicine Why: Hospital Follow Up Contact information: [address] [place] [address] [phone]                Allergies  Allergen Reactions   Lioresal [Baclofen] Other (See Comments)    Lethargy     Zocor [Simvastatin] Nausea Only   Allergies as of 08/10/2023       Reactions   Lioresal [baclofen] Other (See Comments)   Lethargy     Zocor [simvastatin] Nausea Only        Medication  List     TAKE these medications    acetaminophen 325 MG tablet Commonly known as: TYLENOL Take 2 tablets (650 mg total) by mouth every 6 (six) hours as needed for mild pain (pain score 1-3) (or Fever >/= 101).   albuterol 108 (90 Base) MCG/ACT inhaler Commonly known as: VENTOLIN HFA Inhale 2 puffs into the lungs every 4 (four) hours as needed for wheezing or shortness of breath.   albuterol (2.5 MG/3ML) 0.083% nebulizer solution Commonly known as: PROVENTIL Take 3 mLs (2.5 mg total) by nebulization every 2 (two) hours as needed for wheezing or shortness of breath (cough).   ALPRAZolam 0.25 MG tablet Commonly known as: XANAX Take 0.25 mg by mouth 2 (two) times daily as needed for sleep or anxiety.   atorvastatin 40 MG tablet Commonly known as: LIPITOR Take 40 mg by mouth in the morning.   BENEFIBER PO Take 1 Capful by mouth daily.   Breztri Aerosphere 160-9-4.8 MCG/ACT Aero Generic drug: Budeson-Glycopyrrol-Formoterol INHALE 2 PUFFS INTO THE LUNGS TWICE A DAY   diltiazem 120 MG 24 hr capsule Commonly known as: CARDIZEM CD Take 1 capsule (120 mg total) by mouth in the morning. Start taking on: August 11, 2023 What changed:  medication strength how much to take   doxycycline 100 MG capsule Commonly known as: VIBRAMYCIN Take 1 capsule (100 mg total) by mouth 2 (two) times daily for 3 days.   famotidine 20 MG tablet Commonly known as: PEPCID TAKE 1 TABLET BY MOUTH EVERY DAY AFTER SUPPER   ferrous sulfate 325 (65 FE) MG tablet Take 325 mg by mouth in the morning.   fluticasone 50 MCG/ACT nasal spray Commonly known as: FLONASE Place 1 spray into both nostrils daily as needed for allergies or rhinitis.   furosemide 20 MG tablet Commonly known as: LASIX TAKE 1 TABLET BY MOUTH EVERY DAY   gabapentin 300 MG capsule Commonly known as: NEURONTIN Take 1 capsule (300 mg total) by mouth at bedtime.   glimepiride 1 MG tablet Commonly known as: AMARYL Take 1 mg by  mouth daily with breakfast.   guaiFENesin 600 MG 12 hr tablet Commonly known as: Mucinex Take 1 tablet (600 mg total) by mouth 2 (two) times daily.   guaiFENesin-codeine 100-10 MG/5ML syrup Take 5 mLs  by mouth 3 (three) times daily as needed for cough.   metFORMIN 1000 MG tablet Commonly known as: GLUCOPHAGE Take 1,000 mg by mouth daily with supper.   multivitamin with minerals Tabs tablet Take 1 tablet by mouth at bedtime.   OXYGEN Inhale 3 L into the lungs continuous.   pantoprazole 40 MG tablet Commonly known as: PROTONIX Take 1 tablet (40 mg total) by mouth in the morning.   pioglitazone 15 MG tablet Commonly known as: ACTOS Take 15 mg by mouth daily.   predniSONE 20 MG tablet Commonly known as: DELTASONE Take 2 PO QAM x3days,1 PO QAM x3days Start taking on: August 11, 2023   pyridOXINE 50 MG tablet Commonly known as: B-6 Take 50 mg by mouth daily.   sertraline 50 MG tablet Commonly known as: ZOLOFT Take 1 tablet (50 mg total) by mouth daily.   spironolactone 25 MG tablet Commonly known as: ALDACTONE TAKE 1 TABLET (25 MG TOTAL) BY MOUTH DAILY.   tamsulosin 0.4 MG Caps capsule Commonly known as: FLOMAX Take 0.4 mg by mouth in the morning.   vitamin C 250 MG tablet Commonly known as: ASCORBIC ACID Take 250 mg by mouth in the morning.   Vitamin D3 25 MCG (1000 UT) Caps Take 1,000 Units by mouth in the morning.        Procedures/Studies: CT Angio Chest PE W/Cm &/Or Wo Cm Result Date: 08/08/2023 CLINICAL DATA:  Fever, tachycardia, tachypnea, and productive cough EXAM: CT ANGIOGRAPHY CHEST WITH CONTRAST TECHNIQUE: Multidetector CT imaging of the chest was performed using the standard protocol during bolus administration of intravenous contrast. Multiplanar CT image reconstructions and MIPs were obtained to evaluate the vascular anatomy. RADIATION DOSE REDUCTION: This exam was performed according to the departmental dose-optimization program which includes  automated exposure control, adjustment of the mA and/or kV according to patient size and/or use of iterative reconstruction technique. CONTRAST:  75mL OMNIPAQUE IOHEXOL 350 MG/ML SOLN COMPARISON:  Same day chest radiograph, CT chest dated 07/02/2023, 06/28/2022 FINDINGS: Cardiovascular: The study is high quality for the evaluation of pulmonary embolism. There are no filling defects in the central, lobar, segmental or subsegmental pulmonary artery branches to suggest acute pulmonary embolism. Great vessels are normal in course and caliber. Normal heart size. No significant pericardial fluid/thickening. Coronary artery calcifications and aortic atherosclerosis. Mediastinum/Nodes: Imaged thyroid gland without nodules meeting criteria for imaging follow-up by size. Normal esophagus. 10 mm left hilar lymphadenopathy (4:51). Lungs/Pleura: The central airways are patent. Trace layering secretions within the trachea extending into bilateral lower lobe bronchi. Moderate bronchial wall thickening of the left lower lobe. Similar upper lung predominant centrilobular and paraseptal emphysema, right-greater-than-left. Unchanged right upper lobe calcified granulomata. New multifocal ground-glass opacities in the lingula and medial right middle and lower lobes. New peripheral predominant nodularity, ground-glass opacities, and irregular consolidation involving the left lower lobe. Example, subpleural left lower lobe nodule measures 2.1 x 1.2 cm (6:100). No pneumothorax. No pleural effusion. Upper abdomen: Unchanged subcentimeter hypodensity in the peripheral upper spleen appears unchanged compared to 06/28/2022, likely benign. Musculoskeletal: No acute or abnormal lytic or blastic osseous lesions. Unchanged superior endplate deformity of T9 and wedging of T11. Review of the MIP images confirms the above findings. IMPRESSION: 1. No evidence of pulmonary embolism. 2. New peripheral predominant nodularity, ground-glass opacities, and  irregular consolidation involving the left lower lobe, likely infectious/inflammatory. Additional new multifocal ground-glass opacities in the lingula and medial right middle and lower lobes, also likely infectious/inflammatory. 3. Mild left hilar lymphadenopathy, likely  reactive. 4. Aortic Atherosclerosis (ICD10-I70.0) and Emphysema (ICD10-J43.9). Coronary artery calcifications. Assessment for potential risk factor modification, dietary therapy or pharmacologic therapy may be warranted, if clinically indicated. Electronically Signed   By: Agustin Cree M.D.   On: 08/08/2023 13:33   DG Chest 2 View Result Date: 08/08/2023 CLINICAL DATA:  Suspected sepsis.  Tachypnea.  Productive cough. EXAM: CHEST - 2 VIEW COMPARISON:  07/26/2023. FINDINGS: Essentially stable exam. Redemonstration of increased interstitial markings, which corresponds to emphysematous changes and fibrosis/scarring on the recent chest CT scan. There are stable 3 calcified granulomas overlying the right upper lung zone. There are nonspecific opacities overlying the right lower lung zone, laterally which also appear grossly similar to the prior CT scan. No acute dense consolidation or lung collapse seen. Correlate clinically to determine the need for additional imaging with chest CT scan. Bilateral costophrenic angles are clear. Stable cardio-mediastinal silhouette. No acute osseous abnormalities. The soft tissues are within normal limits. IMPRESSION: *Chronic nonspecific opacities throughout bilateral lungs, as discussed above. No acute dense consolidation or lung collapse seen. Correlate clinically to determine the need for additional imaging with chest CT scan. Electronically Signed   By: Jules Schick M.D.   On: 08/08/2023 11:47   DG Chest 2 View Result Date: 07/31/2023 CLINICAL DATA:  Cough for a few weeks.  History of COPD. EXAM: CHEST - 2 VIEW COMPARISON:  06/08/2023.  CT, 07/02/2023. FINDINGS: Cardiac silhouette is normal in size and  configuration. No mediastinal or hilar masses. No evidence of adenopathy. Chronic linear and interstitial opacities, most evident in the right upper lobe and lung bases, stable consistent with inter studies show fibrosis. There stable granuloma in the right upper lobe. No lung consolidation to suggest pneumonia. No evidence of pulmonary edema. No pleural effusion or pneumothorax. Skeletal structures demonstrate a mild compression fracture of T11 and a moderate compression fracture of L1, stable from prior exams. No acute findings. IMPRESSION: 1. No acute cardiopulmonary disease. 2. Stable chronic interstitial scarring and healed granuloma. Electronically Signed   By: Amie Portland M.D.   On: 07/31/2023 13:38     Subjective: Pt reports cough but has been breathing much better.  Nebs have been helping.  No SOB. No CP  Discharge Exam: Vitals:   08/10/23 0539 08/10/23 0821  BP: 95/63   Pulse: 91   Resp: 20   Temp: 97.8 F (36.6 C)   SpO2: 96% 96%   Vitals:   08/09/23 2106 08/10/23 0227 08/10/23 0539 08/10/23 0821  BP: 104/64 96/60 95/63    Pulse: (!) 111 96 91   Resp: 20 20 20    Temp: 98.9 F (37.2 C) 97.9 F (36.6 C) 97.8 F (36.6 C)   TempSrc: Oral Oral Oral   SpO2: 94% 95% 96% 96%  Weight:      Height:       General: Pt is alert, awake, not in acute distress Cardiovascular: normal S1/S2 +, no rubs, no gallops Respiratory: CTA bilaterally, no wheezing, no rhonchi Abdominal: Soft, NT, ND, bowel sounds + Extremities: no edema, no cyanosis   The results of significant diagnostics from this hospitalization (including imaging, microbiology, ancillary and laboratory) are listed below for reference.     Microbiology: Recent Results (from the past 240 hours)  Culture, blood (Routine x 2)     Status: None (Preliminary result)   Collection Time: 08/08/23 11:21 AM   Specimen: BLOOD  Result Value Ref Range Status   Specimen Description BLOOD BLOOD RIGHT HAND  Final   Special  Requests    Final    BOTTLES DRAWN AEROBIC AND ANAEROBIC Blood Culture adequate volume   Culture   Final    NO GROWTH 2 DAYS Performed at [facility], [address] [place], [place] [address]    Report Status PENDING  Incomplete  Culture, blood (Routine x 2)     Status: None (Preliminary result)   Collection Time: 08/08/23 11:21 AM   Specimen: BLOOD  Result Value Ref Range Status   Specimen Description BLOOD BLOOD LEFT ARM  Final   Special Requests   Final    BOTTLES DRAWN AEROBIC AND ANAEROBIC Blood Culture adequate volume   Culture   Final    NO GROWTH 2 DAYS Performed at [facility], [address] [place], [place] [address]    Report Status PENDING  Incomplete  Resp panel by RT-PCR (RSV, Flu A&B, Covid) Anterior Nasal Swab     Status: None   Collection Time: 08/08/23 11:37 AM   Specimen: Anterior Nasal Swab  Result Value Ref Range Status   SARS Coronavirus 2 by RT PCR NEGATIVE NEGATIVE Final    Comment: (NOTE) SARS-CoV-2 target nucleic acids are NOT DETECTED.  The SARS-CoV-2 RNA is generally detectable in upper respiratory specimens during the acute phase of infection. The lowest concentration of SARS-CoV-2 viral copies this assay can detect is 138 copies/mL. A negative result does not preclude SARS-Cov-2 infection and should not be used as the sole basis for treatment or other patient management decisions. A negative result may occur with  improper specimen collection/handling, submission of specimen other than nasopharyngeal swab, presence of viral mutation(s) within the areas targeted by this assay, and inadequate number of viral copies(<138 copies/mL). A negative result must be combined with clinical observations, patient history, and epidemiological information. The expected result is Negative.  Fact Sheet for Patients:  BloggerCourse.com  Fact Sheet for Healthcare Providers:  SeriousBroker.it  This test is no t yet  approved or cleared by the [place] FDA and  has been authorized for detection and/or diagnosis of SARS-CoV-2 by FDA under an Emergency Use Authorization (EUA). This EUA will remain  in effect (meaning this test can be used) for the duration of the COVID-19 declaration under Section 564(b)(1) of the Act, 21 U.S.C.section 360bbb-3(b)(1), unless the authorization is terminated  or revoked sooner.       Influenza A by PCR NEGATIVE NEGATIVE Final   Influenza B by PCR NEGATIVE NEGATIVE Final    Comment: (NOTE) The Xpert Xpress SARS-CoV-2/FLU/RSV plus assay is intended as an aid in the diagnosis of influenza from Nasopharyngeal swab specimens and should not be used as a sole basis for treatment. Nasal washings and aspirates are unacceptable for Xpert Xpress SARS-CoV-2/FLU/RSV testing.  Fact Sheet for Patients: BloggerCourse.com  Fact Sheet for Healthcare Providers: SeriousBroker.it  This test is not yet approved or cleared by the [place] FDA and has been authorized for detection and/or diagnosis of SARS-CoV-2 by FDA under an Emergency Use Authorization (EUA). This EUA will remain in effect (meaning this test can be used) for the duration of the COVID-19 declaration under Section 564(b)(1) of the Act, 21 U.S.C. section 360bbb-3(b)(1), unless the authorization is terminated or revoked.     Resp Syncytial Virus by PCR NEGATIVE NEGATIVE Final    Comment: (NOTE) Fact Sheet for Patients: BloggerCourse.com  Fact Sheet for Healthcare Providers: SeriousBroker.it  This test is not yet approved or cleared by the [place] FDA and has been authorized for detection and/or diagnosis of  SARS-CoV-2 by FDA under an Emergency Use Authorization (EUA). This EUA will remain in effect (meaning this test can be used) for the duration of the COVID-19 declaration under Section 564(b)(1) of  the Act, 21 U.S.C. section 360bbb-3(b)(1), unless the authorization is terminated or revoked.  Performed at [facility], [address], [place], [place] [address]   MRSA Next Gen by PCR, Nasal     Status: Abnormal   Collection Time: 08/09/23 12:39 AM   Specimen: Nasal Mucosa; Nasal Swab  Result Value Ref Range Status   MRSA by PCR Next Gen DETECTED (A) NOT DETECTED Final    Comment: REPEATED TO VERIFY CRITICAL RESULT CALLED TO, READ BACK BY AND VERIFIED WITH: ASHLEY SHELTON AT [id number] BY A. SNYDER (NOTE) The GeneXpert MRSA Assay (FDA approved for NASAL specimens only), is one component of a comprehensive MRSA colonization surveillance program. It is not intended to diagnose MRSA infection nor to guide or monitor treatment for MRSA infections. Test performance is not FDA approved in patients less than 44 years old. Performed at [facility], [address] [place], [place] [address]      Labs: BNP (last 3 results) Recent Labs    06/08/23 1510 07/26/23 1146 08/09/23 0538  BNP 17.0 7.6 88.0   Basic Metabolic Panel: Recent Labs  Lab 08/08/23 1121 08/09/23 0538 08/10/23 0415  NA 136 138 138  K 4.1 4.3 4.2  CL 99 99 102  CO2 27 30 27   GLUCOSE 182* 310* 331*  BUN 18 19 21   CREATININE 0.78 0.79 0.74  CALCIUM 9.4 8.8* 8.9  MG  --  1.7 2.1   Liver Function Tests: Recent Labs  Lab 08/08/23 1121 08/09/23 0538 08/10/23 0415  AST 20 15 15   ALT 37 27 26  ALKPHOS 74 54 58  BILITOT 1.6* 1.4* 0.9  PROT 7.2 6.3* 6.8  ALBUMIN 3.9 3.0* 3.3*   No results for input(s): "LIPASE", "AMYLASE" in the last 168 hours. No results for input(s): "AMMONIA" in the last 168 hours. CBC: Recent Labs  Lab 08/08/23 1121 08/09/23 0538 08/10/23 0415  WBC 12.5* 7.9 9.2  NEUTROABS 10.2* 7.0 7.8*  HGB 13.8 12.3* 11.8*  HCT 42.0 37.7* 37.6*  MCV 85.0 87.3 88.5  PLT 128* 108* 127*   Cardiac Enzymes: No results for input(s): "CKTOTAL", "CKMB", "CKMBINDEX", "TROPONINI" in the last  168 hours. BNP: Invalid input(s): "POCBNP" CBG: Recent Labs  Lab 08/09/23 1117 08/09/23 1624 08/09/23 2100 08/10/23 0231 08/10/23 0712  GLUCAP 232* 211* 356* 316* 252*   D-Dimer No results for input(s): "DDIMER" in the last 72 hours. Hgb A1c No results for input(s): "HGBA1C" in the last 72 hours. Lipid Profile No results for input(s): "CHOL", "HDL", "LDLCALC", "TRIG", "CHOLHDL", "LDLDIRECT" in the last 72 hours. Thyroid function studies No results for input(s): "TSH", "T4TOTAL", "T3FREE", "THYROIDAB" in the last 72 hours.  Invalid input(s): "FREET3" Anemia work up No results for input(s): "VITAMINB12", "FOLATE", "FERRITIN", "TIBC", "IRON", "RETICCTPCT" in the last 72 hours. Urinalysis    Component Value Date/Time   COLORURINE YELLOW 08/08/2023 1010   APPEARANCEUR CLEAR 08/08/2023 1010   LABSPEC 1.041 (H) 08/08/2023 1010   PHURINE 5.0 08/08/2023 1010   GLUCOSEU 50 (A) 08/08/2023 1010   HGBUR NEGATIVE 08/08/2023 1010   BILIRUBINUR NEGATIVE 08/08/2023 1010   KETONESUR NEGATIVE 08/08/2023 1010   PROTEINUR NEGATIVE 08/08/2023 1010   NITRITE NEGATIVE 08/08/2023 1010   LEUKOCYTESUR NEGATIVE 08/08/2023 1010   Sepsis Labs Recent Labs  Lab 08/08/23 1121 08/09/23 0538 08/10/23  0415  WBC 12.5* 7.9 9.2   Microbiology Recent Results (from the past 240 hours)  Culture, blood (Routine x 2)     Status: None (Preliminary result)   Collection Time: 08/08/23 11:21 AM   Specimen: BLOOD  Result Value Ref Range Status   Specimen Description BLOOD BLOOD RIGHT HAND  Final   Special Requests   Final    BOTTLES DRAWN AEROBIC AND ANAEROBIC Blood Culture adequate volume   Culture   Final    NO GROWTH 2 DAYS Performed at [facility], [address] [place], [place] [address]    Report Status PENDING  Incomplete  Culture, blood (Routine x 2)     Status: None (Preliminary result)   Collection Time: 08/08/23 11:21 AM   Specimen: BLOOD  Result Value Ref Range Status   Specimen  Description BLOOD BLOOD LEFT ARM  Final   Special Requests   Final    BOTTLES DRAWN AEROBIC AND ANAEROBIC Blood Culture adequate volume   Culture   Final    NO GROWTH 2 DAYS Performed at [facility], [address] [place], [place] [address]    Report Status PENDING  Incomplete  Resp panel by RT-PCR (RSV, Flu A&B, Covid) Anterior Nasal Swab     Status: None   Collection Time: 08/08/23 11:37 AM   Specimen: Anterior Nasal Swab  Result Value Ref Range Status   SARS Coronavirus 2 by RT PCR NEGATIVE NEGATIVE Final    Comment: (NOTE) SARS-CoV-2 target nucleic acids are NOT DETECTED.  The SARS-CoV-2 RNA is generally detectable in upper respiratory specimens during the acute phase of infection. The lowest concentration of SARS-CoV-2 viral copies this assay can detect is 138 copies/mL. A negative result does not preclude SARS-Cov-2 infection and should not be used as the sole basis for treatment or other patient management decisions. A negative result may occur with  improper specimen collection/handling, submission of specimen other than nasopharyngeal swab, presence of viral mutation(s) within the areas targeted by this assay, and inadequate number of viral copies(<138 copies/mL). A negative result must be combined with clinical observations, patient history, and epidemiological information. The expected result is Negative.  Fact Sheet for Patients:  BloggerCourse.com  Fact Sheet for Healthcare Providers:  SeriousBroker.it  This test is no t yet approved or cleared by the [place] FDA and  has been authorized for detection and/or diagnosis of SARS-CoV-2 by FDA under an Emergency Use Authorization (EUA). This EUA will remain  in effect (meaning this test can be used) for the duration of the COVID-19 declaration under Section 564(b)(1) of the Act, 21 U.S.C.section 360bbb-3(b)(1), unless the authorization is terminated  or revoked  sooner.       Influenza A by PCR NEGATIVE NEGATIVE Final   Influenza B by PCR NEGATIVE NEGATIVE Final    Comment: (NOTE) The Xpert Xpress SARS-CoV-2/FLU/RSV plus assay is intended as an aid in the diagnosis of influenza from Nasopharyngeal swab specimens and should not be used as a sole basis for treatment. Nasal washings and aspirates are unacceptable for Xpert Xpress SARS-CoV-2/FLU/RSV testing.  Fact Sheet for Patients: BloggerCourse.com  Fact Sheet for Healthcare Providers: SeriousBroker.it  This test is not yet approved or cleared by the [place] FDA and has been authorized for detection and/or diagnosis of SARS-CoV-2 by FDA under an Emergency Use Authorization (EUA). This EUA will remain in effect (meaning this test can be used) for the duration of the COVID-19 declaration under Section 564(b)(1) of the Act, 21 U.S.C. section  360bbb-3(b)(1), unless the authorization is terminated or revoked.     Resp Syncytial Virus by PCR NEGATIVE NEGATIVE Final    Comment: (NOTE) Fact Sheet for Patients: BloggerCourse.com  Fact Sheet for Healthcare Providers: SeriousBroker.it  This test is not yet approved or cleared by the [place] FDA and has been authorized for detection and/or diagnosis of SARS-CoV-2 by FDA under an Emergency Use Authorization (EUA). This EUA will remain in effect (meaning this test can be used) for the duration of the COVID-19 declaration under Section 564(b)(1) of the Act, 21 U.S.C. section 360bbb-3(b)(1), unless the authorization is terminated or revoked.  Performed at [facility], [address], [place], [place] [address]   MRSA Next Gen by PCR, Nasal     Status: Abnormal   Collection Time: 08/09/23 12:39 AM   Specimen: Nasal Mucosa; Nasal Swab  Result Value Ref Range Status   MRSA by PCR Next Gen DETECTED (A) NOT DETECTED Final    Comment:  REPEATED TO VERIFY CRITICAL RESULT CALLED TO, READ BACK BY AND VERIFIED WITH: ASHLEY SHELTON AT [phone] [id number] BY A. SNYDER (NOTE) The GeneXpert MRSA Assay (FDA approved for NASAL specimens only), is one component of a comprehensive MRSA colonization surveillance program. It is not intended to diagnose MRSA infection nor to guide or monitor treatment for MRSA infections. Test performance is not FDA approved in patients less than 67 years old. Performed at [facility], [address] [place], [place] [address]    Time coordinating discharge: 41 mins  SIGNED:  Standley Dakins, MD  [facility] 08/10/2023, 10:07 AM How to contact the [facility] Attending or Consulting provider 7A - 7P or covering provider during after hours 7P -7A, for this patient?  Check the care team in [facility] and look for a) attending/consulting TR[facility] provider listed and b) the [facility] team listed Log into [email or web address] and use [place]'s universal password to access. If you do not have the password, please contact the hospital operator. Locate the [facility] provider you are looking for under [facility] and page to a number that you can be directly reached. If you still have difficulty reaching the provider, please page the [facility] (Director on Call) for the Hospitalists listed on amion for assistance.

## 2023-08-12 ENCOUNTER — Telehealth: Payer: Self-pay

## 2023-08-12 NOTE — Telephone Encounter (Signed)
Spoke with patient. Reviewed CT results from 07/02/2023 that resulted on 07/16/2023. Advised repeat CT in 3 months was recommended. Since scan patient has been sick, was seen by Dr. Sherene Sires on 07/26/2023 and 08/08/2023. He was hospitalized 08/08/2023 for pneumonia. CT angio chest completed 08/08/2023.   Maralyn Sago, will you look at recent CT results and see if the plan is still a 3 month f/u? That would be 09/30/2023 if you agree.   Thank you!  Revonda Standard

## 2023-08-12 NOTE — Transitions of Care (Post Inpatient/ED Visit) (Signed)
   08/12/2023  Name: [name] MRN: [id number] DOB: Jul 23, 1955  Today's TOC FU Call Status: Today's TOC FU Call Status:: Unsuccessful Call (1st Attempt) Unsuccessful Call (1st Attempt) Date: 08/12/23  Attempted to reach the patient regarding the most recent Inpatient/ED visit. Patient was called in an Outreach attempt to offer VBCI  30-day TOC program. Pt is eligible for program due to potential risk for readmission and/or high utilization. Unfortunately, I was not able to speak with the patient in regards to recent hospital discharge     Patient's voicemail has  a generic greeting. To maintain HIPAA compliance, left message with VBCI CM contact information and a request for a call back .   Follow Up Plan: Additional outreach attempts will be made to reach the patient to complete the Transitions of Care (Post Inpatient/ED visit) call.    Susa Loffler , BSN, RN [facility]   VBCI-Population Health RN Care Manager Direct Dial [phone]  Fax: [phone] Website: [email or web address]

## 2023-08-13 ENCOUNTER — Telehealth: Payer: Self-pay

## 2023-08-13 LAB — CULTURE, BLOOD (ROUTINE X 2)
Culture: NO GROWTH
Culture: NO GROWTH
Special Requests: ADEQUATE
Special Requests: ADEQUATE

## 2023-08-13 NOTE — Transitions of Care (Post Inpatient/ED Visit) (Signed)
   08/13/2023  Name: KING PINZON MRN: [id number] DOB: June 18, 1956  Today's TOC FU Call Status: Today's TOC FU Call Status:: Unsuccessful Call (2nd Attempt) Unsuccessful Call (1st Attempt) Date: 08/12/23 Unsuccessful Call (2nd Attempt) Date: 08/13/23  Attempted to reach the patient regarding the most recent Inpatient/ED visit. Patient was called in an Outreach attempt to offer VBCI  30-day TOC program. Pt is eligible for program due to potential risk for readmission and/or high utilization. Unfortunately, I was not able to speak with the patient in regards to recent hospital discharge     Patient's voicemail has  a generic greeting. To maintain HIPAA compliance, left message with VBCI CM contact information only  and a request for a call back .   Follow Up Plan: Additional outreach attempts will be made to reach the patient to complete the Transitions of Care (Post Inpatient/ED visit) call.   Bari Mayans , BSN, RN [facility]   VBCI-Population Health RN Care Manager Direct Dial [phone]  Fax: [phone] Website: [email or web address]

## 2023-08-14 ENCOUNTER — Telehealth: Payer: Self-pay

## 2023-08-14 NOTE — Transitions of Care (Post Inpatient/ED Visit) (Addendum)
 08/14/2023  Name: [name] MRN: [id number] DOB: [birth year]  Today's TOC FU Call Status: Today's TOC FU Call Status:: Successful TOC FU Call Completed Declined 30 day program or any follow up calls for now.  Patient's Name and Date of Birth confirmed.  Transition Care Management Follow-up Telephone Call Date of Discharge: 08/10/23 [facility] Type of Discharge: Inpatient Admission Primary Inpatient Discharge Diagnosis:: Acute Respiratory Failure with hypoxia, seconday to COPD exacerbartion, Community acquired pnuemonia. How have you been since you were released from the hospital?: Better Any questions or concerns?: No  Items Reviewed: Did you receive and understand the discharge instructions provided?: Yes Any new allergies since your discharge?: No Dietary orders reviewed?:  (Follows a low salt diet and general heart health on his own.) Do you have support at home?: Yes People in Home: spouse, child(ren), adult Name of Support/Comfort Primary Source: [name], [name] (Spouse)  [phone] (Mobile)  Medications Reviewed Today: Medications Reviewed Today     Reviewed by Carolee Heron NOVAK, RN (Case Manager) on 08/14/23 at 1004  Med List Status: <None>   Medication Order Taking? Sig Documenting Provider Last Dose Status Informant  acetaminophen  (TYLENOL ) 325 MG tablet [id number] Yes Take 2 tablets (650 mg total) by mouth every 6 (six) hours as needed for mild pain (pain score 1-3) (or Fever >/= 101). Pearlean Manus, MD Taking Active Spouse/Significant Other, Self  albuterol  (PROVENTIL ) (2.5 MG/3ML) 0.083% nebulizer solution [id number] Yes Take 3 mLs (2.5 mg total) by nebulization every 2 (two) hours as needed for wheezing or shortness of breath (cough). Vicci Afton CROME, MD Taking Active   albuterol  (VENTOLIN  HFA) 108 (90 Base) MCG/ACT inhaler [id number] Yes Inhale 2 puffs into the lungs every 4 (four) hours as needed for wheezing or shortness of breath.  Pearlean Manus, MD Taking Active Spouse/Significant Other, Self  ALPRAZolam  (XANAX ) 0.25 MG tablet [id number] Yes Take 0.25 mg by mouth 2 (two) times daily as needed for sleep or anxiety. [provider] Taking Active Self, Spouse/Significant Other  atorvastatin  (LIPITOR) 40 MG tablet [id number] No Take 40 mg by mouth in the morning.  Patient not taking: Reported on 08/14/2023   [provider] Not Taking Active Self, Spouse/Significant Other  BREZTRI  AEROSPHERE 160-9-4.8 MCG/ACT AERO [id number] Yes INHALE 2 PUFFS INTO THE LUNGS TWICE A DAY [name], Ozell NOVAK, MD Taking Active Self, Spouse/Significant Other  Cholecalciferol  (VITAMIN D3) 25 MCG (1000 UT) CAPS [id number] Yes Take 1,000 Units by mouth in the morning. [provider] Taking Active Self, Spouse/Significant Other  diltiazem  (CARDIZEM  CD) 120 MG 24 hr capsule [id number] Yes Take 1 capsule (120 mg total) by mouth in the morning. Vicci Afton CROME, MD Taking Active   famotidine  (PEPCID ) 20 MG tablet [id number] Yes TAKE 1 TABLET BY MOUTH EVERY DAY AFTER SUPPER [name], Michael B, MD Taking Active Spouse/Significant Other, Self  ferrous sulfate  325 (65 FE) MG tablet [id number] Yes Take 325 mg by mouth in the morning. [provider] Taking Active Self, Spouse/Significant Other  fluticasone  (FLONASE ) 50 MCG/ACT nasal spray [id number] Yes Place 1 spray into both nostrils daily as needed for allergies or rhinitis. [provider] Taking Active Self, Spouse/Significant Other  furosemide  (LASIX ) 20 MG tablet [id number] Yes TAKE 1 TABLET BY MOUTH EVERY DAY Rudy Josette RAMAN, PA-C Taking Active Spouse/Significant Other, Self  gabapentin  (NEURONTIN ) 300 MG capsule [id number] Yes Take 1 capsule (300 mg total) by mouth at bedtime. Pearlean Manus, MD Taking Active Self, Spouse/Significant Other  glimepiride  (AMARYL ) 1 MG tablet  [id number] Yes Take 1 mg by mouth daily with breakfast. [provider] Taking Active Self,  Spouse/Significant Other  guaiFENesin  (MUCINEX ) 600 MG 12 hr tablet [id number] Yes Take 1 tablet (600 mg total) by mouth 2 (two) times daily. Pearlean Manus, MD Taking Active Spouse/Significant Other, Self  guaiFENesin -codeine  100-10 MG/5ML syrup [id number] Yes Take 5 mLs by mouth 3 (three) times daily as needed for cough. Vicci Afton CROME, MD Taking Active   metFORMIN  (GLUCOPHAGE ) 1000 MG tablet [id number] Yes Take 1,000 mg by mouth daily with supper. [provider] Taking Active Self, Spouse/Significant Other  Multiple Vitamin (MULTIVITAMIN WITH MINERALS) TABS tablet [id number] Yes Take 1 tablet by mouth at bedtime. Pearlean Manus, MD Taking Active Self, Spouse/Significant Other  OXYGEN  [id number] Yes Inhale 3 L into the lungs continuous. [provider] Taking Active Self, Spouse/Significant Other           Med Note [facility], COUR[facility]   Mon Jun 13, 2020 12:00 PM)    pantoprazole  (PROTONIX ) 40 MG tablet [id number] Yes Take 1 tablet (40 mg total) by mouth in the morning. Rudy Josette RAMAN, PA-C Taking Active Self, Spouse/Significant Other  pioglitazone (ACTOS) 15 MG tablet [id number] Yes Take 15 mg by mouth daily. [provider] Taking Active Self, Spouse/Significant Other  predniSONE  (DELTASONE ) 20 MG tablet [id number] Yes Take 2 PO QAM x3days,1 PO QAM x3days Johnson, Clanford L, MD Taking Active   pyridOXINE (B-6) 50 MG tablet [id number] Yes Take 50 mg by mouth daily. [provider] Taking Active Self, Spouse/Significant Other  sertraline  (ZOLOFT ) 50 MG tablet [id number] Yes Take 1 tablet (50 mg total) by mouth daily. Pearlean Manus, MD Taking Active Self, Spouse/Significant Other  spironolactone  (ALDACTONE ) 25 MG tablet [id number] Yes TAKE 1 TABLET (25 MG TOTAL) BY MOUTH DAILY. Rudy Josette RAMAN, PA-C Taking Active Spouse/Significant Other, Self  tamsulosin  (FLOMAX ) 0.4 MG CAPS capsule [id number] Yes Take 0.4 mg by mouth in the morning. [provider]  Taking Active Self, Spouse/Significant Other  vitamin C  (ASCORBIC ACID ) 250 MG tablet [id number] Yes Take 250 mg by mouth in the morning. [provider] Taking Active Self, Spouse/Significant Other  Wheat Dextrin (BENEFIBER PO) [id number] Yes Take 1 Capful by mouth daily. [provider] Taking Active Self, Spouse/Significant Other            Home Care and Equipment/Supplies: Were Home Health Services Ordered?: No Any new equipment or medical supplies ordered?: No  Functional Questionnaire: Do you need assistance with bathing/showering or dressing?: No Do you need assistance with meal preparation?: No Do you need assistance with eating?: No Do you have difficulty maintaining continence: Yes (Urgency) Do you need assistance with getting out of bed/getting out of a chair/moving?: No Do you have difficulty managing or taking your medications?: No  Follow up appointments reviewed: PCP Follow-up appointment confirmed?: Yes Date of PCP follow-up appointment?: 08/16/23 Follow-up Provider: Dr Gaither [facility] Follow-up appointment confirmed?: Yes Date of Specialist follow-up appointment?:  (Patient will call today to confirm date but they had contacted patient already. TBD but he felt it was in the next 2 weeks.) Follow-Up Specialty Provider:: Dr Ozell America Do you need transportation to your follow-up appointment?: No Do you understand care options if your condition(s) worsen?: Yes-patient verbalized understanding  SDOH Interventions Today    Flowsheet Row Most Recent Value  SDOH Interventions   Food Insecurity Interventions Intervention Not Indicated  Housing Interventions Intervention Not Indicated  Transportation Interventions Intervention Not Indicated  Utilities Interventions Intervention Not Indicated  TOC Interventions Today    Flowsheet Row Most Recent Value  TOC Interventions   TOC Interventions Discussed/Reviewed TOC Interventions  Discussed  [Reviewed AVS, discharge instructions, when to call provider or 911 if in doubt. Has PCP appointment schedule 08/16/23. Pulmonary specialist. Dr CHRISTELLA Harms office contacted pt for follow up but he could not remember when and will call the office today.]       Interventions Today    Flowsheet Row Most Recent Value  Chronic Disease   Chronic disease during today's visit Congestive Heart Failure (CHF), Chronic Obstructive Pulmonary Disease (COPD)  General Interventions   General Interventions Discussed/Reviewed General Interventions Discussed, General Interventions Reviewed, Doctor Visits, Durable Medical Equipment (DME), Community Resources  [Reviewed how to contact HTA for any additonal benefit resources if needed. Patient verbalized he had contact information and that HTA had reviewed his benefits with him.]  Doctor Visits Discussed/Reviewed Doctor Visits Discussed, PCP, Specialist  Durable Medical Equipment (DME) Oxygen   PCP/Specialist Visits Compliance with follow-up visit  Exercise Interventions   Exercise Discussed/Reviewed Assistive device use and maintanence  [Patient is on chronic oxygen  at home at 3L/[place] and is feeling better but still in recovery mode. No HH follow up was ordered. Activity as tolerated per patient.]  Education Interventions   Education Provided Provided Education  Provided Verbal Education On Medication, When to see the doctor, Development Worker, Community, Nutrition, [employer], Other  [Reviewed understanding of when to call provider or when to call 911 or go to ED.]  Mental Health Interventions   Mental Health Discussed/Reviewed Mental Health Reviewed  Nutrition Interventions   Nutrition Discussed/Reviewed Nutrition Reviewed  [Patient follows health health and low salt diet.]  Pharmacy Interventions   Pharmacy Dicussed/Reviewed Medications and their functions, Medication Adherence, Pharmacy Topics Discussed  Safety Interventions   Safety Discussed/Reviewed  Safety Discussed, Home Safety  Home Safety Assistive Devices  [Home oygen via [employer], patient has contact number and was on this PTA.]  Advanced Directive Interventions   Advanced Directives Discussed/Reviewed Advanced Directives Discussed  [Reviewed, does not have ACP documents but stated he could   handle that himself when resources offered duirng this call.]       08/14/23: Successful post discharge [facility] outreach call completed with patient telephonically. Patient is engaged in self care management, has basic understanding of medications and no issues with medication compliance or adherence per his answers to questions. Has all medications written down and were all reviewed with patient. No new allergies since discharge. Has PCP appointment already schedule for 08/16/23 ay 0945 am with Dr. Gaither Langton. Stated pulmonary specialist follow up appointment, that office had contacted him since discharge but he could not remember date/time and was going to call the office again today to re-confirm, but he felt it was within the next two weeks per discharge follow up plan on AVS. He also had the contact number for the office of Dr. Ozell America, pulmonary specialist. Patient was sent home with an antibiotic for 3 days which he has completed. This was noted on Medication list/review.  No SDOH needs, but patient was consulted/reviewed regarding potential other benefits offered by insurance provider. Patient indicated he had been apprised of benefits by HTA provider and did have contact number in case other needs or questions concerning benefits should arise. Patient declined 30 day program, though was appreciative of post discharge outreach. He feels has a good handle with the support of spouse on his self care management, medication regime, when to call the provider or  call 911. He will follow up on specialist appointment for pulmonary.  Of note: Per [facility] inpatient notes, patient has a RN visit once a month from [employer].     Bing Edison MSN, RN RN [employer] [facility]  VBCI-Population Health Office Hours Wed/Thur  8:00 am-6:00 pm Direct SECURE Dial: [phone] Main Phone [phone]  Fax: [phone] [email or web address]

## 2023-08-16 DIAGNOSIS — J9601 Acute respiratory failure with hypoxia: Secondary | ICD-10-CM | POA: Diagnosis not present

## 2023-08-16 DIAGNOSIS — E1129 Type 2 diabetes mellitus with other diabetic kidney complication: Secondary | ICD-10-CM | POA: Diagnosis not present

## 2023-08-16 DIAGNOSIS — Z23 Encounter for immunization: Secondary | ICD-10-CM | POA: Diagnosis not present

## 2023-08-16 DIAGNOSIS — J189 Pneumonia, unspecified organism: Secondary | ICD-10-CM | POA: Diagnosis not present

## 2023-08-29 IMAGING — CT CT HEAD W/O CM
4 series · 17 of 47 positions shown, 19 images · non-contrast
Comparison: None.

CLINICAL DATA: Fall 1.5 weeks ago.  Headache.  Head injury.

EXAM:
CT HEAD WITHOUT CONTRAST
TECHNIQUE: Contiguous axial images were obtained from the base of the skull
through the vertex without intravenous contrast.

[Series 2: head w o · axial · 0.48mm/px · z∈[+175,+285]mm · 7 of 30 slices shown, 9 images]
[im 4/30  brain]
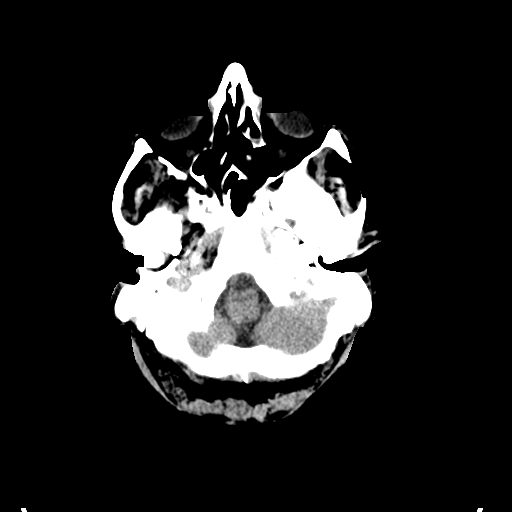
[im 4/30  bone]
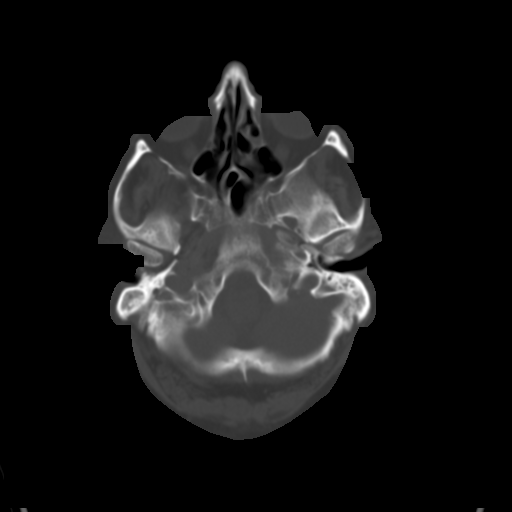
[im 8/30  brain]
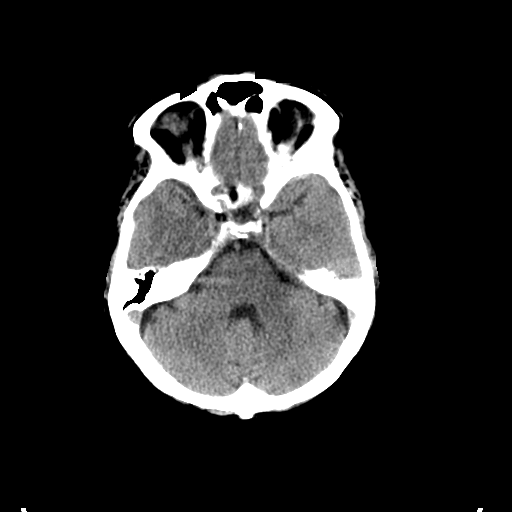
[im 11/30  brain]
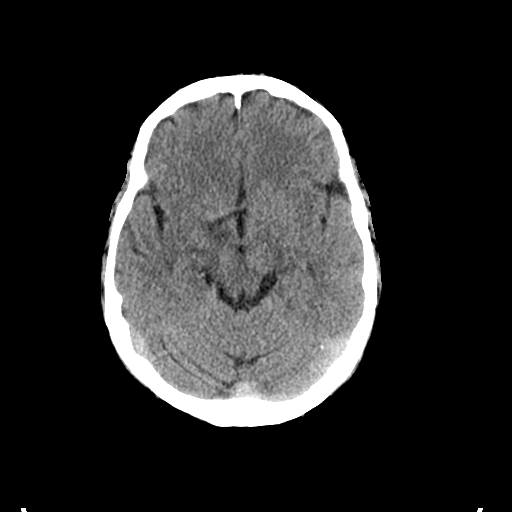
[im 15/30  brain]
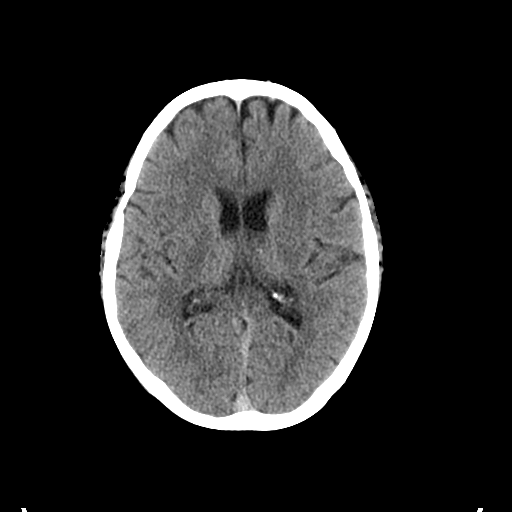
[im 19/30  brain]
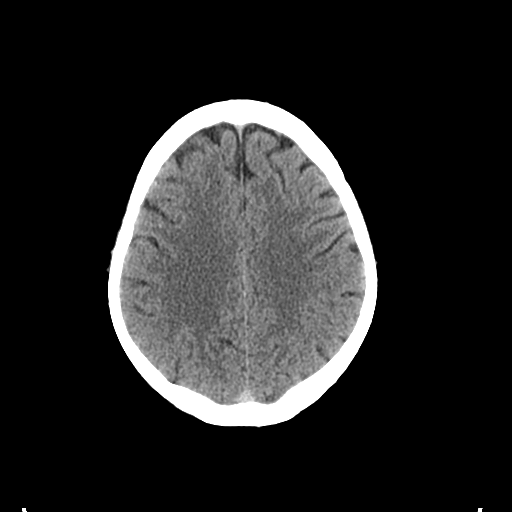
[im 19/30  bone]
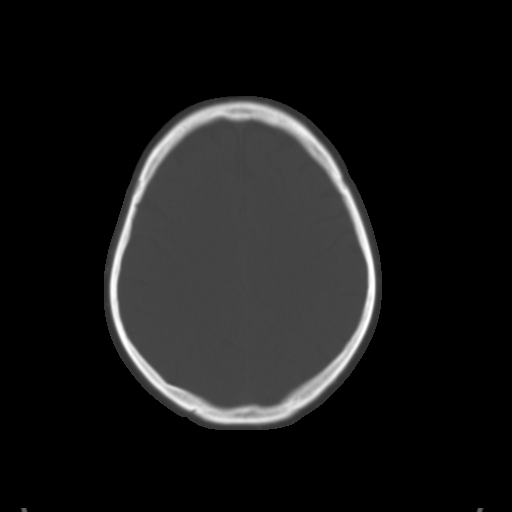
[im 22/30  brain]
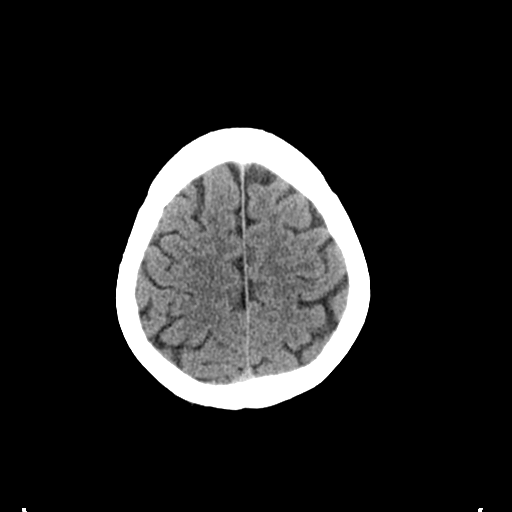
[im 26/30  brain]
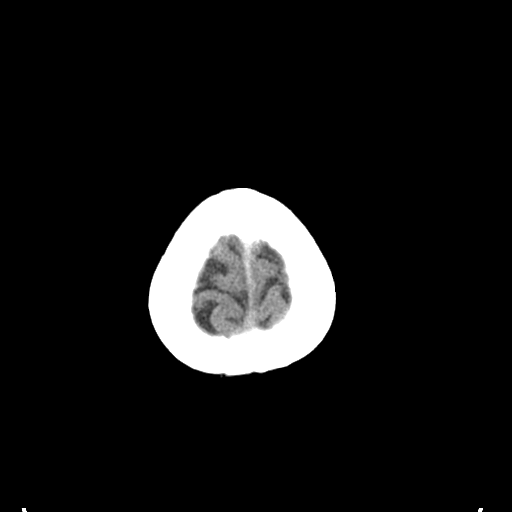

[Series 3: head bone · axial · 0.48mm/px · z∈[+174,+226]mm · 4 of 75 slices shown]
[im 8/75  bone]
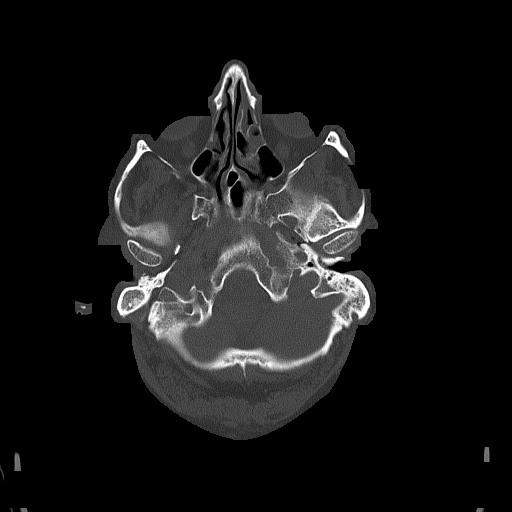
[im 15/75  bone]
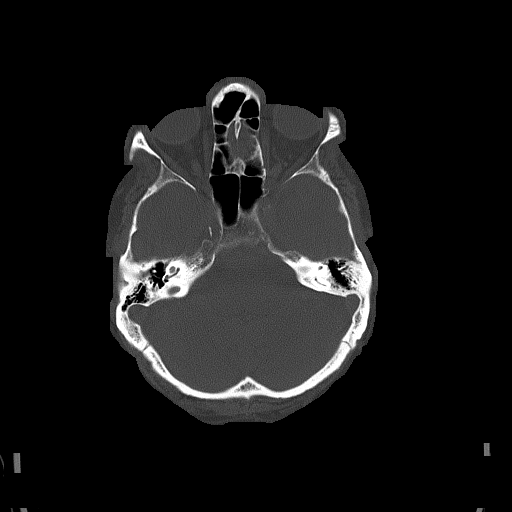
[im 23/75  bone]
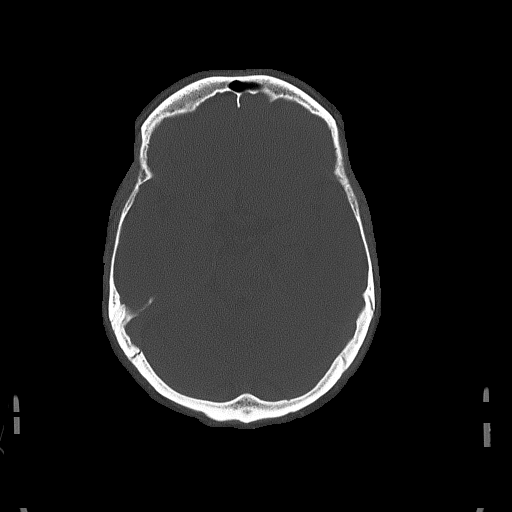
[im 34/75  bone]
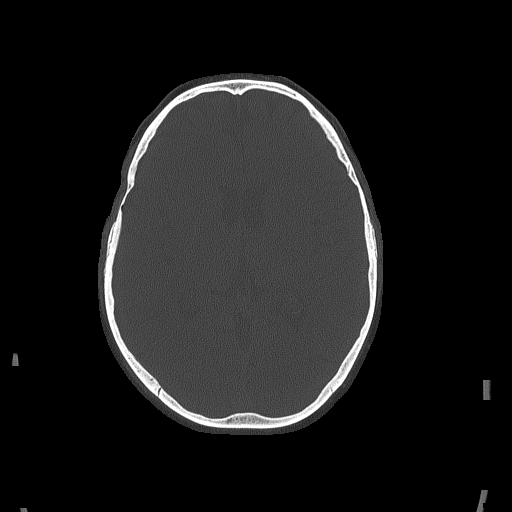

[Series 4: coronal soft · coronal · 0.31mm/px · 3 of 66 slices shown]
[im 22/66  brain]
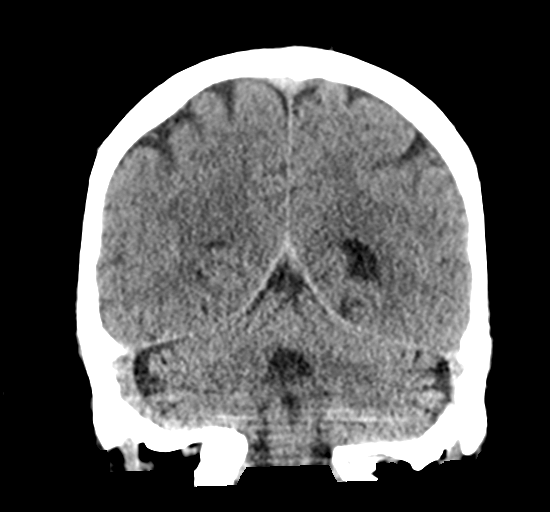
[im 29/66  brain]
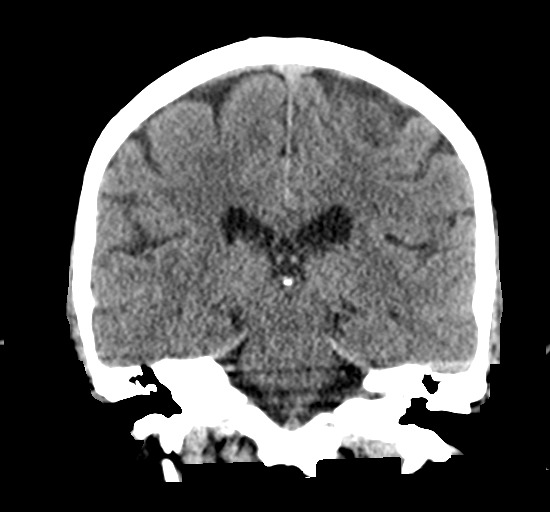
[im 37/66  brain]
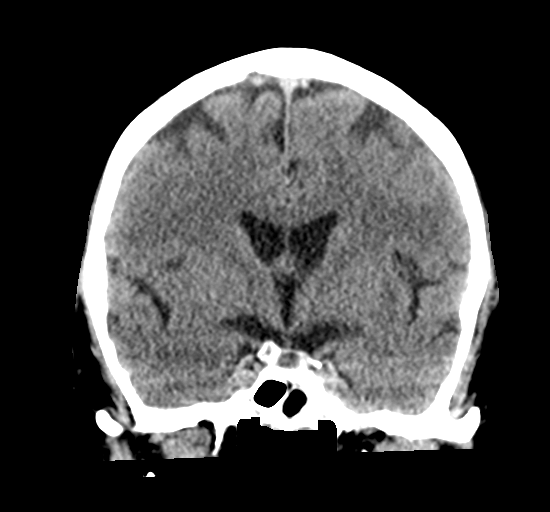

[Series 5: sagittal soft · sagittal · 0.31mm/px · 3 of 58 slices shown]
[im 20/58  brain]
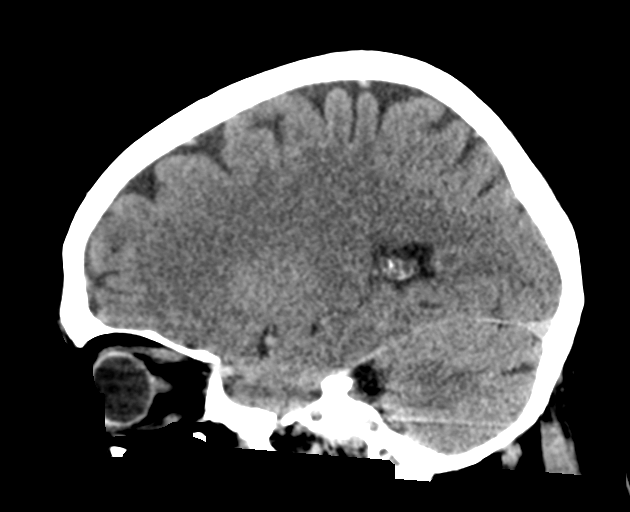
[im 29/58  brain]
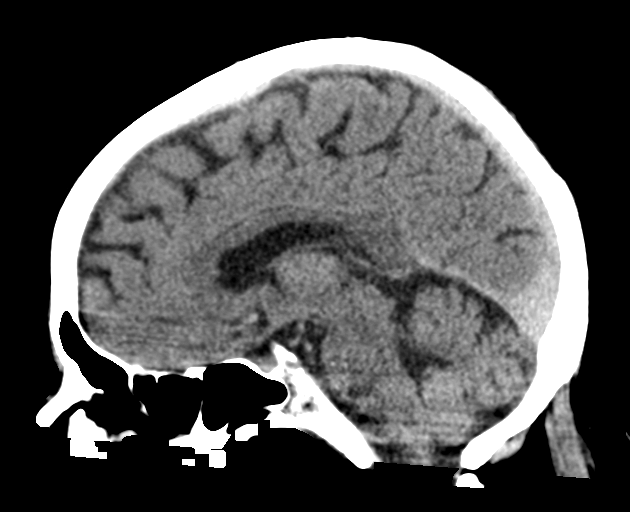
[im 39/58  brain]
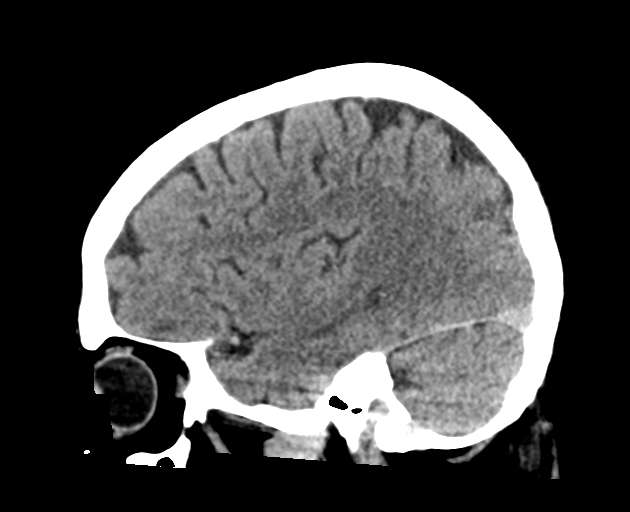

[17 of 47 positions shown; findings below may reference images not displayed]

FINDINGS: Brain: No evidence of acute infarction, hemorrhage, hydrocephalus,
extra-axial collection or mass lesion/mass effect.

Vascular: Negative for hyperdense vessel

Skull: Negative

Sinuses/Orbits: Negative

Other: None
IMPRESSION: Negative CT head

## 2023-08-30 NOTE — Progress Notes (Signed)
 Referring Provider: Carylon Perches, MD Primary Care Physician:  Carylon Perches, MD Primary GI Physician: Dr. [name]  Chief Complaint  Patient presents with   Follow-up    Follow up on NASH. Pt was in hospital Dec and Jan    HPI:   [name] is a 68 y.o. male with history significant for chronic respiratory failure on supplemental oxygen (2-3L), type 2 diabetes, HTN, GERD, adenomatous colon polyps, hemorrhoids with intermittent rectal bleeding, biopsy-proven NASH fibrosis in 2018, elastography [name], chronic thrombocytopenia following with hematology, splenomegaly, portal gastropathy, currently being followed for routine cirrhosis care, presenting today for follow-up of cirrhosis and peripheral edema.   Last seen in the office 05/15/23. GERD well controlled on pantoprazole 40 mg daily and famotidine 20 mg at bedtime. Noted 1-2+ LE edema on exam, otherwise doing well from cirrhosis standpoint. Recommended updating labs and if CR/electrolytes normal, start diuretics. Also recommended surveillance EGD and [place].   Labs 05/14/24: Mild alk phos elevation at 129. Otherwise labs stable. Recommended starting Lasix 20 mg daily and spironolactone 25 mg daily.   BMP 05/23/24 with CR and electrolytes wnl.   Abdominal US 05/24/23:  1. Cirrhotic morphology of the liver. No focal lesion. 2. Possible 5 mm stone right kidney.  EGD 05/22/23: - Normal esophagus.  - Portal hypertensive gastropathy.  - Normal duodenal bulb and second portion of the duodenum. - Recommended repeat EGD in 2 years.    In the interim, patient has been admitted x 2 with acute on chronic respiratory failure with pneumonia.    Today:  Ascites/peripheral edema: Increased swelling in bilateral lower extremities, right greater than left though this is chronic for him.  Swelling started in the last few weeks.  He has also been out of spironolactone for the last 3 weeks or so which she did not realize until he was coming to this  appointment today.  He has continued taking Lasix 20 mg daily.  Denies any increasing abdominal distention.  No abdominal pain.  Paracentesis: Never History of SBP:  No Encephalopathy:   None   MELD 3.0: 9 on 08/08/23  [place]: As per above. AFP: Normal in November 2024.  Hep A/B vaccination: Completed vaccination in 2019 but appears he only received 1 dose. No immunity on labs in 2022.  EGD:  As per above.  BB: No   No alcohol.  No tylenol recently.    Past Medical History:  Diagnosis Date   Anxiety    Arthritis    Asthma    Chest pain    a. 2003 Cath: nl cors.   Cirrhosis ([name])    secondary to MASH   Concussion    COPD (chronic obstructive pulmonary disease) ([name])    chronically on 3-4L [place]   Depression    Diabetes mellitus    Dyslipidemia    Tachycardia   Dyspnea    Dysrhythmia    Fracture 06/2014   lower back "L-1"   GERD (gastroesophageal reflux disease)    Hypertension    NASH (nonalcoholic steatohepatitis) 2018   [name], undergoing routine cirrhosis care; [name]   Pneumonia 12/01/2021   PONV (postoperative nausea and vomiting)    Splenomegaly    Thrombocytopenia ([name])     Past Surgical History:  Procedure Laterality Date   BIOPSY  07/26/2016   Procedure: BIOPSY;  Surgeon: [name], MD;  [facility]: [facility];  Service: Endoscopy;;  gastric   CARDIAC  CATHETERIZATION  10/22/2001   normal coronary arteriea (Dr. Aram Candela)   COLONOSCOPY  08/31/2011   [id number] rectal and colon polyps-treated. Single tubular adenoma. Next TCS 08/2016   COLONOSCOPY N/A 07/26/2016   Dr. [name]: diverticulosis in sigmoid and descending colon. 6 mm benign splenic flexure. surveillance 5 yeras   COLONOSCOPY WITH PROPOFOL N/A 08/07/2021   Surgeon: [name], DO;  Nonbleeding internal hemorrhoids, three 3-6 mm polyps in the transverse colon resected and retrieved, otherwise normal exam.  Pathology with 1 tubular  adenoma, otherwise benign colonic mucosa.  Recommended 5-year repeat.   ESOPHAGOGASTRODUODENOSCOPY N/A 07/26/2016   Dr. [name]: normal esophagus, portal gastropathy, chronic gastritis, normal duodenum, screening EGD 2 years    ESOPHAGOGASTRODUODENOSCOPY (EGD) WITH ESOPHAGEAL DILATION N/A 05/15/2013   [id number] esophagus-status post Elease Hashimoto dilation/Portal gastropathy. Antral erosions-status post biopsy. extrinsic compression along lesser curvature likely secondary to splenomegaly. gastric bx benign   ESOPHAGOGASTRODUODENOSCOPY (EGD) WITH PROPOFOL N/A 12/12/2020   Surgeon: [name], MD; ormal esophagus s/p dilation, portal hypertensive gastropathy.  Due for repeat in 2024.   ESOPHAGOGASTRODUODENOSCOPY (EGD) WITH PROPOFOL N/A 05/22/2023   Procedure: ESOPHAGOGASTRODUODENOSCOPY (EGD) WITH PROPOFOL;  Surgeon: [name], MD;  [facility];  Service: Endoscopy;  Laterality: N/A;  9:30 am, asa 3   ETHMOIDECTOMY Bilateral 03/02/2022   Procedure: ETHMOIDECTOMY;  Surgeon: [name], MD;  Location[facility];  Service: ENT;  Laterality: Bilateral;   HERNIA REPAIR  03/2009   MALONEY DILATION N/A 12/12/2020   Procedure: Elease Hashimoto DILATION;  Surgeon: [name], MD;  [facility]: [facility];  Service: Endoscopy;  Laterality: N/A;   MAXILLARY ANTROSTOMY Bilateral 03/02/2022   Procedure: MAXILLARY ANTROSTOMY;  Surgeon: [name], MD;  Location: [facility];  Service: ENT;  Laterality: Bilateral;   NASAL SEPTOPLASTY W/ TURBINOPLASTY Bilateral 03/02/2022   Procedure: NASAL SEPTOPLASTY WITH TURBINATE REDUCTION;  Surgeon: [name], MD;  Location: [facility] OR;  Service: ENT;  Laterality: Bilateral;   POLYPECTOMY  07/26/2016   Procedure: POLYPECTOMY;  Surgeon: [name], MD;  [facility];  Service: Endoscopy;;  colon   POLYPECTOMY  08/07/2021   Procedure: POLYPECTOMY;  Surgeon: [name], DO;  [facility]: [facility];  Service: Endoscopy;;   SINUS ENDO WITH FUSION N/A 03/02/2022    Procedure: SINUS ENDO WITH FUSION;  Surgeon: [name], MD;  Location: [facility];  Service: ENT;  Laterality: N/A;   TRANSTHORACIC ECHOCARDIOGRAM  2011   EF 50-55%, stage 1 diastolic dysfunction, trace TR & pulm valve regurg    VASECTOMY  1989    Current Outpatient Medications  Medication Sig Dispense Refill   acetaminophen (TYLENOL) 325 MG tablet Take 2 tablets (650 mg total) by mouth every 6 (six) hours as needed for mild pain (pain score 1-3) (or Fever >/= 101).     albuterol (PROVENTIL) (2.5 MG/3ML) 0.083% nebulizer solution Take 3 mLs (2.5 mg total) by nebulization every 2 (two) hours as needed for wheezing or shortness of breath (cough). 75 mL 2   albuterol (VENTOLIN HFA) 108 (90 Base) MCG/ACT inhaler Inhale 2 puffs into the lungs every 4 (four) hours as needed for wheezing or shortness of breath. 8 each 12   ALPRAZolam (XANAX) 0.25 MG tablet Take 0.25 mg by mouth 2 (two) times daily as needed for sleep or anxiety.     atorvastatin (LIPITOR) 40 MG tablet Take 40 mg by mouth in the morning.     BREZTRI AEROSPHERE 160-9-4.8 MCG/ACT AERO INHALE 2 PUFFS INTO THE  LUNGS TWICE A DAY 10.7 each 11   Cholecalciferol (VITAMIN D3) 25 MCG (1000 UT) CAPS Take 1,000 Units by mouth in the morning.     diltiazem (CARDIZEM CD) 120 MG 24 hr capsule Take 1 capsule (120 mg total) by mouth in the morning. 30 capsule 1   doxycycline (VIBRAMYCIN) 100 MG capsule Take 100 mg by mouth 2 (two) times daily. Order was to take for 3 days post discharge on 08/10/23. Patient has completed the antibiotic as ordered on medication review. COMPLETED COURSE.     famotidine (PEPCID) 20 MG tablet TAKE 1 TABLET BY MOUTH EVERY DAY AFTER SUPPER 90 tablet 0   ferrous sulfate 325 (65 FE) MG tablet Take 325 mg by mouth in the morning.     fluticasone (FLONASE) 50 MCG/ACT nasal spray Place 1 spray into both nostrils daily as needed for allergies or rhinitis.     furosemide (LASIX) 20 MG tablet TAKE 1 TABLET BY MOUTH EVERY DAY 90 tablet 1    gabapentin (NEURONTIN) 300 MG capsule Take 1 capsule (300 mg total) by mouth at bedtime. 30 capsule 5   glimepiride (AMARYL) 1 MG tablet Take 1 mg by mouth daily with breakfast.     metFORMIN (GLUCOPHAGE) 1000 MG tablet Take 1,000 mg by mouth daily with supper.     Multiple Vitamin (MULTIVITAMIN WITH MINERALS) TABS tablet Take 1 tablet by mouth at bedtime. 30 tablet 2   OXYGEN Inhale 3 L into the lungs continuous.     pantoprazole (PROTONIX) 40 MG tablet Take 1 tablet (40 mg total) by mouth in the morning. 90 tablet 0   pioglitazone (ACTOS) 15 MG tablet Take 15 mg by mouth daily.     pyridOXINE (B-6) 50 MG tablet Take 50 mg by mouth daily.     sertraline (ZOLOFT) 50 MG tablet Take 1 tablet (50 mg total) by mouth daily. 30 tablet 3   tamsulosin (FLOMAX) 0.4 MG CAPS capsule Take 0.4 mg by mouth in the morning.     vitamin C (ASCORBIC ACID) 250 MG tablet Take 250 mg by mouth in the morning.     Wheat Dextrin (BENEFIBER PO) Take 1 Capful by mouth daily.     guaiFENesin (MUCINEX) 600 MG 12 hr tablet Take 1 tablet (600 mg total) by mouth 2 (two) times daily. (Patient not taking: Reported on 09/02/2023) 60 tablet 2   guaiFENesin-codeine 100-10 MG/5ML syrup Take 5 mLs by mouth 3 (three) times daily as needed for cough. (Patient not taking: Reported on 09/02/2023) 120 mL 0   predniSONE (DELTASONE) 20 MG tablet Take 2 PO QAM x3days,1 PO QAM x3days (Patient not taking: Reported on 09/02/2023) 9 tablet 0   spironolactone (ALDACTONE) 25 MG tablet Take 1 tablet (25 mg total) by mouth daily. 90 tablet 1   No current facility-administered medications for this visit.    Allergies as of 09/02/2023 - Review Complete 09/02/2023  Allergen Reaction Noted   Lioresal [baclofen] Other (See Comments) 07/24/2016   Zocor [simvastatin] Nausea Only 10/05/2013    Family History  Problem Relation Age of Onset   Heart disease Mother    Cancer Mother        lymph nodes   Diabetes Mother    Arthritis Other    Lung  disease Other    Asthma Other    Kidney disease Other    Ovarian cancer Sister    Diabetes Brother    Heart disease Brother    Hypertension Brother    Diabetes  Sister    Diabetes Sister    Cirrhosis Sister 28       no etoh   Colon cancer Neg Hx     Social History   Socioeconomic History   Marital status: Married    Spouse name: Dois Davenport   Number of children: 1   Years of education: Not on file   Highest education level: Not on file  Occupational History   Not on file  Tobacco Use   Smoking status: Former    Current packs/day: 0.00    Average packs/day: 1.3 packs/day for 36.0 years (45.0 ttl pk-yrs)    Types: Cigarettes    Start date: 04/1975    Quit date: 04/2011    Years since quitting: 12.4   Smokeless tobacco: Never  Vaping Use   Vaping status: Never Used  Substance and Sexual Activity   Alcohol use: No   Drug use: No   Sexual activity: Not Currently  Other Topics Concern   Not on file  Social History Narrative   Not on file   Social Drivers of Health   Financial Resource Strain: Low Risk  (08/16/2020)   Overall Financial Resource Strain (CARDIA)    Difficulty of Paying Living Expenses: Not hard at all  Food Insecurity: No Food Insecurity (08/14/2023)   Hunger Vital Sign    Worried About Running Out of Food in the Last Year: Never true    Ran Out of Food in the Last Year: Never true  Transportation Needs: No Transportation Needs (08/14/2023)   PRAPARE - Administrator, Civil Service (Medical): No    Lack of Transportation (Non-Medical): No  Physical Activity: Sufficiently Active (08/16/2020)   Exercise Vital Sign    Days of Exercise per Week: 7 days    Minutes of Exercise per Session: 30 min  Stress: No Stress Concern Present (08/16/2020)   [employer] of Occupational Health - Occupational Stress Questionnaire    Feeling of Stress : Not at all  Social Connections: Moderately Integrated (08/14/2023)   Social Connection and Isolation Panel [NHANES]     Frequency of Communication with Friends and Family: Three times a week    Frequency of Social Gatherings with Friends and Family: Three times a week    Attends Religious Services: More than 4 times per year    Active Member of Clubs or Organizations: No    Attends Banker Meetings: Never    Marital Status: Married    Review of Systems: Gen: Denies fever, chills, cold or flulike symptoms, presyncope, syncope..  CV: Denies chest pain, palpitations. Resp: Denies dyspnea, cough. GI: See HPI Heme: See HPI  Physical Exam: BP (!) 97/56   Pulse (!) 105   Temp 98.6 F (37 C)   Ht 5\' 7"  (1.702 m)   Wt 169 lb 9.6 oz (76.9 kg)   BMI 26.56 kg/m  General:   Alert and oriented. No distress noted. Pleasant and cooperative.  Head:  Normocephalic and atraumatic. Eyes:  Conjuctiva clear without scleral icterus. Heart:  S1, S2 present without murmurs appreciated. Lungs:  Clear to auscultation bilaterally. No wheezes, rales, or rhonchi. No distress.  Abdomen:  +BS, soft, non-tender. Abdomen is round and full with large AP diameter, but chronic without significant change. No rebound or guarding. No HSM or masses noted. Msk:  Symmetrical without gross deformities. Normal posture. Extremities:  With 2+ bilateral LE pitting edema, R mildly>L. No calf pain.  Neurologic:  Alert and  oriented x4 Psych:  Normal mood and affect.    Assessment:  68 y.o. male with history significant for chronic respiratory failure on supplemental oxygen (2-3L), type 2 diabetes, HTN, GERD, adenomatous colon polyps, hemorrhoids with intermittent rectal bleeding,   biopsy-proven NASH fibrosis in 2018, now with cirrhosis, associated thrombocytopenia following with hematology, splenomegaly, portal gastropathy, presenting today for follow-up of cirrhosis and peripheral edema.   Cirrhosis/LE edema:  Secondary to MASH. MELD 3.0 was 9 in January.  EGD up to date with PHG, no varices, due for surveillance in  November 2026. Ultrasound up-to-date in November 2024 with no focal liver lesion.  Clinically has been doing well aside from development of LE edema, started on Lasix 20 mg and spironolactone 25 mg daily in November 2024 with clinical improvement, but recently with recurrent LE edema, possibly secondary to being out of spironolactone for the last 3 weeks.  Will have him resume spironolactone, repeat BMP in 1 week, and see him back in 2 weeks for reevaluation.  May need to escalate diuretic therapy depending on clinical response.   Regarding hepatitis A/B immunization.  Patient was vaccinated 2019, but labs in 2022 showed no immunity.  Recommended receiving hepatitis A/B vaccination series with PCP.   Plan:  Resume spironolactone 25 mg daily. Continue Lasix 20 mg daily. Repeat BMP in 1 week. Nutrition:  High-protein diet from a primarily plant-based diet. Avoid red meat.  No raw or undercooked meat, seafood, or shellfish. Low-fat/cholesterol/carbohydrate diet. Limit sodium to no more than 2000 mg/day including everything that you eat and drink. Recommend at least 30 minutes of aerobic and resistance exercise 3 days/week. Complete hepatitis A/B vaccine series with PCP. Follow-up in 2 weeks.   Ermalinda Memos, PA-C [facility] 09/02/2023

## 2023-09-02 ENCOUNTER — Other Ambulatory Visit: Payer: Self-pay | Admitting: Gastroenterology

## 2023-09-02 ENCOUNTER — Encounter: Payer: Self-pay | Admitting: Gastroenterology

## 2023-09-02 ENCOUNTER — Ambulatory Visit (INDEPENDENT_AMBULATORY_CARE_PROVIDER_SITE_OTHER): Payer: PPO | Admitting: Gastroenterology

## 2023-09-02 VITALS — BP 97/56 | HR 105 | Temp 98.6°F | Ht 67.0 in | Wt 169.6 lb

## 2023-09-02 DIAGNOSIS — K7581 Nonalcoholic steatohepatitis (NASH): Secondary | ICD-10-CM | POA: Diagnosis not present

## 2023-09-02 DIAGNOSIS — R600 Localized edema: Secondary | ICD-10-CM | POA: Diagnosis not present

## 2023-09-02 DIAGNOSIS — K219 Gastro-esophageal reflux disease without esophagitis: Secondary | ICD-10-CM

## 2023-09-02 DIAGNOSIS — K7460 Unspecified cirrhosis of liver: Secondary | ICD-10-CM

## 2023-09-02 MED ORDER — SPIRONOLACTONE 25 MG PO TABS: 25.0000 mg | ORAL_TABLET | Freq: Every day | ORAL | 1 refills | Status: DC

## 2023-09-02 NOTE — Patient Instructions (Signed)
 Resume spironolactone 25 mg daily.  I have sent a new prescription to your pharmacy.  Please have repeat BMP in 1 week at [facility].  Nutrition Recommendations:  High-protein diet from a primarily plant-based diet. Avoid red meat.  No raw or undercooked meat, seafood, or shellfish. Low-fat/cholesterol/carbohydrate diet. Limit sodium to no more than 2000 mg/day including everything that you eat and drink. Recommend at least 30 minutes of aerobic and resistance exercise 3 days/week.  I will plan to see back in 2 weeks to reevaluate the swelling in her lower extremities.  Ermalinda Memos, PA-C [facility]

## 2023-09-05 ENCOUNTER — Telehealth: Payer: Self-pay

## 2023-09-05 NOTE — Telephone Encounter (Signed)
Error

## 2023-09-08 NOTE — Progress Notes (Deleted)
 [name], male    DOB: 28-Oct-1955     MRN: [id number]   Brief patient profile:  19 yowm MM/quit smoking 2012 with COPD GOLD 3 criteria 06/17/20 previously followed by Dr Juanetta Gosling referred back to pulmonary clinic 12/09/2019 in [place].   History of Present Illness  12/09/2019  Pulmonary/ 1st office eval/Dreyden Rohrman  symbicort 160/spiriva  Chief Complaint  Patient presents with   Pulmonary Consult    Former patient of Dr Juanetta Gosling.  Breathing has been worse over the past few wks. Recently treated with Doxy per PCP for COPD exacerbation. He is occ coughing up some green sputum.   flare 11/23/19  rx doxy 5/19 x 7 days  Dyspnea:  Baseline uses [facility] parking / MMRC3 = can't walk 100 yards even at a slow pace at a flat grade s stopping due to sob  On 2lpm  Cough: improved less purulent purulent  Sleep: wakes up coughing since onset /sometimes uses saba proair  SABA use: rarely daytime rec Continue protonix 40 mg Take 30-60 min before first meal of the day  GERD diet  Plan A = Automatic = Always=    Budesonide/formoterol (symbicort ) 2 puffs first thing in am and 12 hours later and use spiriva x 2 pffs after the morning symbicort (ok to change back to the powder after 2 weeks but will need to stop it if you start coughing  Work on inhaler technique  Plan B = Backup (to supplement plan A, not to replace it) Only use your albuterol inhaler as a rescue medication    Plan C = Crisis (instead of Plan B   Make sure you check your oxygen saturations at highest level of activity to be sure it stays over 90%   Prednisone 10 mg take  4 each am x 2 days,   2 each am x 2 days,  1 each am x 2 days and stop  For cough > mucinex dm 1200 mg every 12 hours as needed  Would wait another 2 weeks before you get your first shot for covid 19    [name] eval 08/01/20 rec ST eval done 08/05/20: low risk asp SLP Diet Recommendations Regular solids;Thin liquid  Liquid Administration via Straw;Cup  Medication Administration  Whole meds with liquid  Compensations Slow rate;Small sips/bites  Postural Changes Seated upright at 90 degrees;Remain semi-upright after after feeds/meals (Comment)     [name] 03/02/22 FESS   04/05/2022  f/u ov/[place] office/Weltha Cathy re: GOLD 3 copd maint on symb/spiriva = 240 dollars Chief Complaint  Patient presents with   Follow-up    Saw Dr. Suszanne Conners and had surgery and is doing much better with his breathing   Dyspnea:  mailbox and back using 02 sometimes/  Cough: overall better since sinus surgery  Sleeping: noct better on bed blocks  SABA use: much less hfa/ never neb  02: 3lpm hs / sitting  and walks off it rec Plan A = Automatic = Always=    Breztri Take 2 puffs first thing in am and then another 2 puffs about 12 hours later.   Plan B = Backup (to supplement plan A, not to replace it) Only use your albuterol inhaler as a rescue medication  Plan C = Crisis (instead of Plan B but only if Plan B stops working) - only use your albuterol nebulizer if you first try Plan B and it fails to help Prednisone 10 mg take  4 each am x 2 days,   2  each am x 2 days,  1 each am x 2 days and stop  For cough mucinex dm 1200 mg every 12 hours as needed  Pantoprazole (protonix) 40 mg   Take  30-60 min before first meal of the day and Pepcid (famotidine)  20 mg (OTC) after supper until return to office GERD diet reviewed, bed blocks rec  If cough not better add another gabapentin 300 mg in am   Make sure you check your oxygen saturation  AT  your highest level of activity (not after you stop)   to be sure it stays over 90%       03/07/2023  f/u ov/[place] office/Ron Beske re: GOLD 3 / 02 hs and prn  MPNS  maint on breztri  / not taking pepcid any more Chief Complaint  Patient presents with   COPD    GOLD III  Dyspnea:  mb x 300 ft flat can do s stopping  Cough: worse at hs and in am p stirring but no excess / purulent sputum Sleeping: bed blocks and pillows s resp cc  SABA use: hfa x once a week/ neb  hardly any  02: 3lpm at hs , prn daytime  Lung cancer screening: due 03/2023  Rec Add pepcid 20 mg an hour before bedtime (automatically) Make sure you check your oxygen saturation  AT  your highest level of activity (not after you stop)   to be sure it stays over 90%  Also  Ok to try albuterol 15 min before an activity (on alternating days)  that you know would usually make you short of breath      Had LDSCT in Sept 2024 "? During [facility] {"  05/01/2023  f/u ov/[place] office/Hennessey Cantrell re: COPD GOLD 3/ 02 hs and prn  maint on Breztri   Chief Complaint  Patient presents with   COPD    Gold III  Dyspnea:  mb and back sometimes s 02  Cough:resolved p abx per Dr Ouida Sills  Sleeping:  bed blocks/ 3 pillows s resp cc    resp cc  SABA use: 2 x this  02: 3lpm at hs  Rec For cough/ congestion > mucinex or mucinex dm up to maximum of  1200 mg every 12 hours as needed  We can order best fit for portable 02 thru [employer] Remember to make sure you check your oxygen saturation  AT  your highest level of activity (not after you stop)   to be sure it stays over 90%    Admission date:  06/08/2023     Discharge Date:  06/11/2023    Discharge Diagnosis  Acute on chronic respiratory failure with hypoxia ([name]) [J96.21] Pneumonia due to infectious organism, unspecified laterality, unspecified part of lung [J18.9]     Principal Problem:   Acute on chronic respiratory failure with hypoxia ([name])   Gastroesophageal reflux disease   HTN (hypertension)   Type 2 diabetes mellitus without complication, without long-term current use of insulin ([name])   COPD with acute exacerbation ([name])   CAP (community acquired pneumonia)   AKI (acute kidney injury) ([name])     Never got back to baseline since d/c 06/11/23 in terms of sob/cough   07/26/2023  f/u ov/[place] office/Adiba Fargnoli re: GOLD 3 / 02 HS  maint on breztri   Chief Complaint  Patient presents with   Follow-up  Worse since finished prednisone  x around a week prior  to OV  more congested / green mucus  ? Best rx  was levaquin per wife  Dyspnea:  room to room since Jul 12 2023 mostly tired /some presyncope as well standing / eating and drinking ok  Cough:worse at hs and off and on all night  Sleeping: bed is flat with bunch of pillows    SABA use: last used neb> 12 h prior to OV  and no hfa on day of ov either 02: 3lpm 24/7  sometimes 4lpm but not really titrating to sats  Rec Levaquin 500 mg daily x 10 days  - stop if have any pain ankle tendons  Prednisone 10 mg x 2 each am until better then  1 daily x 5 days and stop  Stop losartan for now  We may need to stop your  ACTOS in future as it may be contributing with your breathing problems  Please schedule a follow up office visit in  2 weeks, sooner if needed Late add:   next step may be  collect mucus for opportunistic gnr and atypical TB - ask about flutter use next ov also Bring all meds next ov / consider addison's from freq steroid exp    08/08/2023  f/u ov/[place] office/Alli Jasmer re: GOLD 3 copd  maint on breztri / acutely ill this am but came in for f/u of  of last ov/ had been feeling better.  Chief Complaint  Patient presents with   Follow-up    2 week follow up   Dyspnea:  worse this am assoc with fever/ nausea  Cough: rattling/ orange   02: 84% on 3 lpm  Rec    09/10/2023  f/u ov/[place] office/[place] re: GOLD 3 copd  maint on ***  No chief complaint on file.   Dyspnea:  *** Cough: *** Sleeping: ***   resp cc  SABA use: *** 02: ***  Lung cancer screening: ***   No obvious day to day or daytime variability or assoc excess/ purulent sputum or mucus plugs or hemoptysis or cp or chest tightness, subjective wheeze or overt sinus or hb symptoms.    Also denies any obvious fluctuation of symptoms with weather or environmental changes or other aggravating or alleviating factors except as outlined above   No unusual exposure hx or h/o childhood pna/ asthma or knowledge of premature  birth.  Current Allergies, Complete Past Medical History, Past Surgical History, Family History, and Social History were reviewed in [employer] record.  ROS  The following are not active complaints unless bolded Hoarseness, sore throat, dysphagia, dental problems, itching, sneezing,  nasal congestion or discharge of excess mucus or purulent secretions, ear ache,   fever, chills, sweats, unintended wt loss or wt gain, classically pleuritic or exertional cp,  orthopnea pnd or arm/hand swelling  or leg swelling, presyncope, palpitations, abdominal pain, anorexia, nausea, vomiting, diarrhea  or change in bowel habits or change in bladder habits, change in stools or change in urine, dysuria, hematuria,  rash, arthralgias, visual complaints, headache, numbness, weakness or ataxia or problems with walking or coordination,  change in mood or  memory.        No outpatient medications have been marked as taking for the 09/10/23 encounter (Appointment) with Nyoka Cowden, MD.           Past Medical History:  Diagnosis Date   Anxiety    Asthma    Chest pain    a. 2003 Cath: nl cors.   COPD (chronic obstructive pulmonary disease) ([name])    Depression    Diabetes mellitus  Dyslipidemia    Dysrhythmia    Fracture 12/15   lower back "L-1"   GERD (gastroesophageal reflux disease)    Hypertension    PONV (postoperative nausea and vomiting)    Splenomegaly        Objective:    Wt   09/10/2023           ***  08/08/2023        167  07/26/2023        162  05/01/2023      170   03/07/2023        167   09/06/2022        163   04/05/2022        156 12/08/2021          164 09/01/2021        167  06/06/2021      162 12/08/2020          163 09/15/2020        154  05/23/2020      166 04/28/2020      166   12/30/19 162 lb (73.5 kg)  12/21/19 162 lb (73.5 kg)  12/09/19 162 lb (73.5 kg)   Vital signs reviewed  09/10/2023  - Note at rest 02 sats  ***% on ***   General appearance:     ***   Mod bar***                        Assessment

## 2023-09-10 ENCOUNTER — Telehealth: Payer: Self-pay | Admitting: Internal Medicine

## 2023-09-10 ENCOUNTER — Inpatient Hospital Stay: Payer: PPO | Admitting: Internal Medicine

## 2023-09-10 NOTE — Telephone Encounter (Signed)
 Spoke with patient regarding the new 10/10/23 appointment with Dr. Bradly Bienenstock from 09/10/23 due to office ffooding and water damage---offered [facility] office appointment --patient did not wish to travel

## 2023-09-11 DIAGNOSIS — K7581 Nonalcoholic steatohepatitis (NASH): Secondary | ICD-10-CM | POA: Diagnosis not present

## 2023-09-11 DIAGNOSIS — R600 Localized edema: Secondary | ICD-10-CM | POA: Diagnosis not present

## 2023-09-12 LAB — BASIC METABOLIC PANEL
BUN/Creatinine Ratio: 15 (ref 10–24)
BUN: 13 mg/dL (ref 8–27)
CO2: 23 mmol/L (ref 20–29)
Calcium: 9.4 mg/dL (ref 8.6–10.2)
Chloride: 104 mmol/L (ref 96–106)
Creatinine, Ser: 0.87 mg/dL (ref 0.76–1.27)
Glucose: 170 mg/dL — ABNORMAL HIGH (ref 70–99)
Potassium: 4.9 mmol/L (ref 3.5–5.2)
Sodium: 144 mmol/L (ref 134–144)
eGFR: 94 mL/min/{1.73_m2} (ref >59)

## 2023-09-13 ENCOUNTER — Inpatient Hospital Stay: Payer: PPO | Admitting: Internal Medicine

## 2023-09-15 NOTE — Progress Notes (Deleted)
 Referring Provider: Carylon Perches, MD Primary Care Physician:  Carylon Perches, MD Primary GI Physician: Dr. [name]  No chief complaint on file.   HPI:   [name] is a 68 y.o. male with history significant for chronic respiratory failure on supplemental oxygen (2-3L), type 2 diabetes, HTN, GERD, adenomatous colon polyps, hemorrhoids with intermittent rectal bleeding, biopsy-proven NASH fibrosis in 2018, elastography [name], chronic thrombocytopenia following with hematology, splenomegaly, portal gastropathy, currently being followed for routine cirrhosis care, presenting today for follow-up of cirrhosis and peripheral edema.   Lasix 20 mg daily and spironolactone 25 mg daily was started 05/14/2024 due to 1-2+ pitting edema in lower extremities.  Last seen in the office 09/02/2023.  Reported increased swelling in his bilateral lower extremities the right greater than left which was a chronic issue for him.  Noted he had been doing well until a few weeks ago and did not realize he was out of his spironolactone for the last 3 weeks until he came to his office visit.  Recommended continuing Lasix and resuming spironolactone 25 mg daily, BMP in 1 week, follow-up in 2 weeks for reevaluation.  Also needed to complete hep A/B vaccine series with PCP.   BMP 09/11/23 wnl aside from glucose 170.    Today:     EGD 05/22/23: - Normal esophagus.  - Portal hypertensive gastropathy.  - Normal duodenal bulb and second portion of the duodenum. - Recommended repeat EGD in 2 years.   Abdominal US 05/24/23:  1. Cirrhotic morphology of the liver. No focal lesion. 2. Possible 5 mm stone right kidney.  AFP: Normal in November 2024.   Paracentesis: Never History of SBP:  No Encephalopathy:   None  Hep A/B vaccination: Completed vaccination in 2019 but appears he only received 1 dose. No immunity on labs in 2022. ***   MELD 3.0: 9 on 08/08/23  Past Medical History:  Diagnosis Date   Anxiety     Arthritis    Asthma    Chest pain    a. 2003 Cath: nl cors.   Cirrhosis ([name])    secondary to MASH   Concussion    COPD (chronic obstructive pulmonary disease) ([name])    chronically on 3-4L [place]   Depression    Diabetes mellitus    Dyslipidemia    Tachycardia   Dyspnea    Dysrhythmia    Fracture 06/2014   lower back "L-1"   GERD (gastroesophageal reflux disease)    Hypertension    NASH (nonalcoholic steatohepatitis) 2018   [name], undergoing routine cirrhosis care; [name]   Pneumonia 12/01/2021   PONV (postoperative nausea and vomiting)    Splenomegaly    Thrombocytopenia ([name])     Past Surgical History:  Procedure Laterality Date   BIOPSY  07/26/2016   Procedure: BIOPSY;  Surgeon: [name], MD;  [facility];  Service: Endoscopy;;  gastric   CARDIAC CATHETERIZATION  10/22/2001   normal coronary arteriea (Dr. Aram Candela)   COLONOSCOPY  08/31/2011   AVW:[id number] rectal and colon polyps-treated. Single tubular adenoma. Next TCS 08/2016   COLONOSCOPY N/A 07/26/2016   Dr. [name]: diverticulosis in sigmoid and descending colon. 6 mm benign splenic flexure. surveillance 5 yeras   COLONOSCOPY WITH PROPOFOL N/A 08/07/2021   Surgeon: [name], DO;  Nonbleeding internal hemorrhoids, three 3-6 mm polyps in the transverse colon resected and retrieved, otherwise normal exam.  Pathology with 1 tubular  adenoma, otherwise benign colonic mucosa.  Recommended 5-year repeat.   ESOPHAGOGASTRODUODENOSCOPY N/A 07/26/2016   Dr. [name]: normal esophagus, portal gastropathy, chronic gastritis, normal duodenum, screening EGD 2 years    ESOPHAGOGASTRODUODENOSCOPY (EGD) WITH ESOPHAGEAL DILATION N/A 05/15/2013   OZD:[id number] esophagus-status post Elease Hashimoto dilation/Portal gastropathy. Antral erosions-status post biopsy. extrinsic compression along lesser curvature likely secondary to splenomegaly. gastric bx benign    ESOPHAGOGASTRODUODENOSCOPY (EGD) WITH PROPOFOL N/A 12/12/2020   Surgeon: [name], MD; ormal esophagus s/p dilation, portal hypertensive gastropathy.  Due for repeat in 2024.   ESOPHAGOGASTRODUODENOSCOPY (EGD) WITH PROPOFOL N/A 05/22/2023   Procedure: ESOPHAGOGASTRODUODENOSCOPY (EGD) WITH PROPOFOL;  Surgeon: [name], MD;  [facility];  Service: Endoscopy;  Laterality: N/A;  9:30 am, asa 3   ETHMOIDECTOMY Bilateral 03/02/2022   Procedure: ETHMOIDECTOMY;  Surgeon: [name], MD;  Location: [facility] OR;  Service: ENT;  Laterality: Bilateral;   HERNIA REPAIR  03/2009   MALONEY DILATION N/A 12/12/2020   Procedure: Elease Hashimoto DILATION;  Surgeon: [name], MD;  [facility];  Service: Endoscopy;  Laterality: N/A;   MAXILLARY ANTROSTOMY Bilateral 03/02/2022   Procedure: MAXILLARY ANTROSTOMY;  Surgeon: [name], MD;  Location: [facility] OR;  Service: ENT;  Laterality: Bilateral;   NASAL SEPTOPLASTY W/ TURBINOPLASTY Bilateral 03/02/2022   Procedure: NASAL SEPTOPLASTY WITH TURBINATE REDUCTION;  Surgeon: [name], MD;  Location: [facility] OR;  Service: ENT;  Laterality: Bilateral;   POLYPECTOMY  07/26/2016   Procedure: POLYPECTOMY;  Surgeon: [name], MD;  [facility];  Service: Endoscopy;;  colon   POLYPECTOMY  08/07/2021   Procedure: POLYPECTOMY;  Surgeon: [name], DO;  [facility];  Service: Endoscopy;;   SINUS ENDO WITH FUSION N/A 03/02/2022   Procedure: SINUS ENDO WITH FUSION;  Surgeon: [name], MD;  Location: [facility] OR;  Service: ENT;  Laterality: N/A;   TRANSTHORACIC ECHOCARDIOGRAM  2011   EF 50-55%, stage 1 diastolic dysfunction, trace TR & pulm valve regurg    VASECTOMY  1989    Current Outpatient Medications  Medication Sig Dispense Refill   acetaminophen (TYLENOL) 325 MG tablet Take 2 tablets (650 mg total) by mouth every 6 (six) hours as needed for mild pain (pain score 1-3) (or Fever >/= 101).     albuterol (PROVENTIL) (2.5 MG/3ML)  0.083% nebulizer solution Take 3 mLs (2.5 mg total) by nebulization every 2 (two) hours as needed for wheezing or shortness of breath (cough). 75 mL 2   albuterol (VENTOLIN HFA) 108 (90 Base) MCG/ACT inhaler Inhale 2 puffs into the lungs every 4 (four) hours as needed for wheezing or shortness of breath. 8 each 12   ALPRAZolam (XANAX) 0.25 MG tablet Take 0.25 mg by mouth 2 (two) times daily as needed for sleep or anxiety.     atorvastatin (LIPITOR) 40 MG tablet Take 40 mg by mouth in the morning.     BREZTRI AEROSPHERE 160-9-4.8 MCG/ACT AERO INHALE 2 PUFFS INTO THE LUNGS TWICE A DAY 10.7 each 11   Cholecalciferol (VITAMIN D3) 25 MCG (1000 UT) CAPS Take 1,000 Units by mouth in the morning.     diltiazem (CARDIZEM CD) 120 MG 24 hr capsule Take 1 capsule (120 mg total) by mouth in the morning. 30 capsule 1   doxycycline (VIBRAMYCIN) 100 MG capsule Take 100 mg by mouth 2 (two) times daily. Order was to take for 3 days post discharge on 08/10/23. Patient has completed the antibiotic as ordered on medication review. COMPLETED  COURSE.     famotidine (PEPCID) 20 MG tablet TAKE 1 TABLET BY MOUTH EVERY DAY AFTER SUPPER 90 tablet 0   ferrous sulfate 325 (65 FE) MG tablet Take 325 mg by mouth in the morning.     fluticasone (FLONASE) 50 MCG/ACT nasal spray Place 1 spray into both nostrils daily as needed for allergies or rhinitis.     furosemide (LASIX) 20 MG tablet TAKE 1 TABLET BY MOUTH EVERY DAY 90 tablet 1   gabapentin (NEURONTIN) 300 MG capsule Take 1 capsule (300 mg total) by mouth at bedtime. 30 capsule 5   glimepiride (AMARYL) 1 MG tablet Take 1 mg by mouth daily with breakfast.     guaiFENesin (MUCINEX) 600 MG 12 hr tablet Take 1 tablet (600 mg total) by mouth 2 (two) times daily. (Patient not taking: Reported on 09/02/2023) 60 tablet 2   guaiFENesin-codeine 100-10 MG/5ML syrup Take 5 mLs by mouth 3 (three) times daily as needed for cough. (Patient not taking: Reported on 09/02/2023) 120 mL 0   metFORMIN  (GLUCOPHAGE) 1000 MG tablet Take 1,000 mg by mouth daily with supper.     Multiple Vitamin (MULTIVITAMIN WITH MINERALS) TABS tablet Take 1 tablet by mouth at bedtime. 30 tablet 2   OXYGEN Inhale 3 L into the lungs continuous.     pantoprazole (PROTONIX) 40 MG tablet TAKE 1 TABLET (40 MG TOTAL) BY MOUTH IN THE MORNING 90 tablet 1   pioglitazone (ACTOS) 15 MG tablet Take 15 mg by mouth daily.     predniSONE (DELTASONE) 20 MG tablet Take 2 PO QAM x3days,1 PO QAM x3days (Patient not taking: Reported on 09/02/2023) 9 tablet 0   pyridOXINE (B-6) 50 MG tablet Take 50 mg by mouth daily.     sertraline (ZOLOFT) 50 MG tablet Take 1 tablet (50 mg total) by mouth daily. 30 tablet 3   spironolactone (ALDACTONE) 25 MG tablet Take 1 tablet (25 mg total) by mouth daily. 90 tablet 1   tamsulosin (FLOMAX) 0.4 MG CAPS capsule Take 0.4 mg by mouth in the morning.     vitamin C (ASCORBIC ACID) 250 MG tablet Take 250 mg by mouth in the morning.     Wheat Dextrin (BENEFIBER PO) Take 1 Capful by mouth daily.     No current facility-administered medications for this visit.    Allergies as of 09/18/2023 - Review Complete 09/02/2023  Allergen Reaction Noted   Lioresal [baclofen] Other (See Comments) 07/24/2016   Zocor [simvastatin] Nausea Only 10/05/2013    Family History  Problem Relation Age of Onset   Heart disease Mother    Cancer Mother        lymph nodes   Diabetes Mother    Arthritis Other    Lung disease Other    Asthma Other    Kidney disease Other    Ovarian cancer Sister    Diabetes Brother    Heart disease Brother    Hypertension Brother    Diabetes Sister    Diabetes Sister    Cirrhosis Sister 48       no etoh   Colon cancer Neg Hx     Social History   Socioeconomic History   Marital status: Married    Spouse name: Dois Davenport   Number of children: 1   Years of education: Not on file   Highest education level: Not on file  Occupational History   Not on file  Tobacco Use   Smoking  status: Former    Current  packs/day: 0.00    Average packs/day: 1.3 packs/day for 36.0 years (45.0 ttl pk-yrs)    Types: Cigarettes    Start date: 04/1975    Quit date: 04/2011    Years since quitting: 12.4   Smokeless tobacco: Never  Vaping Use   Vaping status: Never Used  Substance and Sexual Activity   Alcohol use: No   Drug use: No   Sexual activity: Not Currently  Other Topics Concern   Not on file  Social History Narrative   Not on file   Social Drivers of Health   Financial Resource Strain: Low Risk  (08/16/2020)   Overall Financial Resource Strain (CARDIA)    Difficulty of Paying Living Expenses: Not hard at all  Food Insecurity: No Food Insecurity (08/14/2023)   Hunger Vital Sign    Worried About Running Out of Food in the Last Year: Never true    Ran Out of Food in the Last Year: Never true  Transportation Needs: No Transportation Needs (08/14/2023)   PRAPARE - Administrator, Civil Service (Medical): No    Lack of Transportation (Non-Medical): No  Physical Activity: Sufficiently Active (08/16/2020)   Exercise Vital Sign    Days of Exercise per Week: 7 days    Minutes of Exercise per Session: 30 min  Stress: No Stress Concern Present (08/16/2020)   [employer] of Occupational Health - Occupational Stress Questionnaire    Feeling of Stress : Not at all  Social Connections: Moderately Integrated (08/14/2023)   Social Connection and Isolation Panel [NHANES]    Frequency of Communication with Friends and Family: Three times a week    Frequency of Social Gatherings with Friends and Family: Three times a week    Attends Religious Services: More than 4 times per year    Active Member of Clubs or Organizations: No    Attends Banker Meetings: Never    Marital Status: Married    Review of Systems: Gen: Denies fever, chills, anorexia. Denies fatigue, weakness, weight loss.  CV: Denies chest pain, palpitations, syncope, peripheral edema, and  claudication. Resp: Denies dyspnea at rest, cough, wheezing, coughing up blood, and pleurisy. GI: Denies vomiting blood, jaundice, and fecal incontinence.   Denies dysphagia or odynophagia. Derm: Denies rash, itching, dry skin Psych: Denies depression, anxiety, memory loss, confusion. No homicidal or suicidal ideation.  Heme: Denies bruising, bleeding, and enlarged lymph nodes.  Physical Exam: There were no vitals taken for this visit. General:   Alert and oriented. No distress noted. Pleasant and cooperative.  Head:  Normocephalic and atraumatic. Eyes:  Conjuctiva clear without scleral icterus. Heart:  S1, S2 present without murmurs appreciated. Lungs:  Clear to auscultation bilaterally. No wheezes, rales, or rhonchi. No distress.  Abdomen:  +BS, soft, non-tender and non-distended. No rebound or guarding. No HSM or masses noted. Msk:  Symmetrical without gross deformities. Normal posture. Extremities:  Without edema. Neurologic:  Alert and  oriented x4 Psych:  Normal mood and affect.    Assessment:     Plan:  ***   Ermalinda Memos, PA-C [facility] 09/18/2023

## 2023-09-18 ENCOUNTER — Ambulatory Visit: Payer: PPO | Admitting: Gastroenterology

## 2023-10-02 ENCOUNTER — Telehealth: Payer: Self-pay

## 2023-10-02 NOTE — Telephone Encounter (Signed)
 LVM for patient to call and schedule 3 month f/u scan. He prefers [facility] and the scan is due this week.

## 2023-10-06 NOTE — Progress Notes (Unsigned)
 [name], male    DOB: Feb 20, 1956     MRN: [id number]   Brief patient profile:  68yowm MM/quit smoking 2012 with COPD GOLD 3 criteria 06/17/20 previously followed by Dr [name] referred back to pulmonary clinic 12/09/2019 in [place].   History of Present Illness  12/09/2019  Pulmonary/ 1st office eval/[name]  symbicort 160/spiriva  Chief Complaint  Patient presents with   Pulmonary Consult    Former patient of Dr [name].  Breathing has been worse over the past few wks. Recently treated with Doxy per PCP for COPD exacerbation. He is occ coughing up some green sputum.   flare 11/23/19  rx doxy 5/19 x 7 days  Dyspnea:  Baseline uses [facility] parking / MMR[employer]3 = can't walk 100 yards even at a slow pace at a flat grade s stopping due to sob  On 2lpm  Cough: improved less purulent purulent  Sleep: wakes up coughing since onset /sometimes uses saba proair  SABA use: rarely daytime rec Continue protonix 40 mg Take 30-60 min before first meal of the day  GERD diet  Plan A = Automatic = Always=    Budesonide/formoterol (symbicort ) 2 puffs first thing in am and 12 hours later and use spiriva x 2 pffs after the morning symbicort (ok to change back to the powder after 2 weeks but will need to stop it if you start coughing  Work on inhaler technique  Plan B = Backup (to supplement plan A, not to replace it) Only use your albuterol inhaler as a rescue medication    Plan C = Crisis (instead of Plan B   Make sure you check your oxygen saturations at highest level of activity to be sure it stays over 90%   Prednisone 10 mg take  4 each am x 2 days,   2 each am x 2 days,  1 each am x 2 days and stop  For cough > mucinex dm 1200 mg every 12 hours as needed  Would wait another 2 weeks before you get your first shot for covid 19    [name] eval 08/01/20 rec ST eval done 08/05/20: low risk asp SLP Diet Recommendations Regular solids;Thin liquid  Liquid Administration via Straw;Cup  Medication Administration  Whole meds with liquid  Compensations Slow rate;Small sips/bites  Postural Changes Seated upright at 90 degrees;Remain semi-upright after after feeds/meals (Comment)     [name] 03/02/22 FESS   04/05/2022  f/u ov/[name] office/[name] re: GOLD 3 copd maint on symb/spiriva = 240 dollars Chief Complaint  Patient presents with   Follow-up    Saw Dr. Suszanne [name] and had surgery and is doing much better with his breathing   Dyspnea:  mailbox and back using 02 sometimes/  Cough: overall better since sinus surgery  Sleeping: noct better on bed blocks  SABA use: much less hfa/ never neb  02: 3lpm hs / sitting  and walks off it rec Plan A = Automatic = Always=    Breztri Take 2 puffs first thing in am and then another 2 puffs about 12 hours later.   Plan B = Backup (to supplement plan A, not to replace it) Only use your albuterol inhaler as a rescue medication  Plan C = Crisis (instead of Plan B but only if Plan B stops working) - only use your albuterol nebulizer if you first try Plan B and it fails to help Prednisone 10 mg take  4 each am x 2 days,   2 each  am x 2 days,  1 each am x 2 days and stop  For cough mucinex dm 1200 mg every 12 hours as needed  Pantoprazole (protonix) 40 mg   Take  30-60 min before first meal of the day and Pepcid (famotidine)  20 mg (OTC) after supper until return to office GERD diet reviewed, bed blocks rec  If cough not better add another gabapentin 300 mg in am   Make sure you check your oxygen saturation  AT  your highest level of activity (not after you stop)   to be sure it stays over 90%       03/07/2023  f/u ov/[place] office/[name] re: GOLD 3 / 02 hs and prn  MPNS  maint on breztri  / not taking pepcid any more Chief Complaint  Patient presents with   COPD    GOLD III  Dyspnea:  mb x 300 ft flat can do s stopping  Cough: worse at hs and in am p stirring but no excess / purulent sputum Sleeping: bed blocks and pillows s resp cc  SABA use: hfa x once a week/ neb  hardly any  02: 3lpm at hs , prn daytime  Lung cancer screening: due 03/2023  Rec Add pepcid 20 mg an hour before bedtime (automatically) Make sure you check your oxygen saturation  AT  your highest level of activity (not after you stop)   to be sure it stays over 90%  Also  Ok to try albuterol 15 min before an activity (on alternating days)  that you know would usually make you short of breath      Had LDSCT in Sept 2024 "? During [facility] {"  05/01/2023  f/u ov/[place] office/[name] re: COPD GOLD 3/ 02 hs and prn  maint on Breztri   Chief Complaint  Patient presents with   COPD    Gold III  Dyspnea:  mb and back sometimes s 02  Cough:resolved p abx per Dr [name]  Sleeping:  bed blocks/ 3 pillows s resp cc    resp cc  SABA use: 2 x this  02: 3lpm at hs  Rec For cough/ congestion > mucinex or mucinex dm up to maximum of  1200 mg every 12 hours as needed  We can order best fit for portable 02 thru [name] Remember to make sure you check your oxygen saturation  AT  your highest level of activity (not after you stop)   to be sure it stays over 90%    Admission date:  06/08/2023     Discharge Date:  06/11/2023    Discharge Diagnosis  Acute on chronic respiratory failure with hypoxia ([name]) [J96.21] Pneumonia due to infectious organism, unspecified laterality, unspecified part of lung [J18.9]     Principal Problem:   Acute on chronic respiratory failure with hypoxia ([name])   Gastroesophageal reflux disease   HTN (hypertension)   Type 2 diabetes mellitus without complication, without long-term current use of insulin ([name])   COPD with acute exacerbation ([name])   CAP (community acquired pneumonia)   AKI (acute kidney injury) ([name])     Never got back to baseline since d/c 06/11/23 in terms of sob/cough   07/26/2023  f/u ov/[place] office/[name] re: GOLD 3 / 02 HS  maint on breztri   Chief Complaint  Patient presents with   Follow-up  Worse since finished prednisone  x around a week prior  to OV  more congested / green mucus  ? Best rx was  levaquin per wife  Dyspnea:  room to room since Jul 12 2023 mostly tired /some presyncope as well standing / eating and drinking ok  Cough:worse at hs and off and on all night  Sleeping: bed is flat with bunch of pillows    SABA use: last used neb> 12 h prior to OV  and no hfa on day of ov either 02: 3lpm 24/7  sometimes 4lpm but not really titrating to sats  Rec Levaquin 500 mg daily x 10 days  - stop if have any pain ankle tendons  Prednisone 10 mg x 2 each am until better then  1 daily x 5 days and stop  Stop losartan for now  We may need to stop your  ACTOS in future as it may be contributing with your breathing problems  Please schedule a follow up office visit in  2 weeks, sooner if needed Late add:   next step may be  collect mucus for opportunistic gnr and atypical TB - ask about flutter use next ov also Bring all meds next ov / consider addison's from freq steroid exp    08/08/2023  f/u ov/[place] office/Rosanne Wohlfarth re: GOLD 3 copd  maint on breztri / acutely ill this am but came in for f/u of  of last ov/ had been feeling better.  Chief Complaint  Patient presents with   Follow-up    2 week follow up   Dyspnea:  worse this am assoc with fever/ nausea  Cough: rattling/ orange   02: 84% on 3 lpm  Rec Admit date: 08/08/2023 Discharge date: 08/10/2023   Recommendations for Outpatient Follow-up:  Follow up with PCP in 1 weeks Follow up with pulmonary in 1-2 weeks    Discharge Condition: STABLE   CODE STATUS: FULL DIET:  resume previous home diet     Brief Hospitalization Summary: Please see all hospital notes, images, labs for full details of the hospitalization. Admission provider HPI:  68 year old male former longtime smoker with Gold III COPD, chronic hypoxic respiratory failure on 3-4 L/min of oxygen, type 2 diabetes mellitus, hypertension, NASH liver disease, liver fibrosis,  chronic thrombocytopenia, dyslipidemia,  dysrhythmia, tachycardia, chronic sinusitis, generalized weakness who was seen at his pulmonary clinic today and subsequently sent to ED with concern for sepsis.  He was following up from 07/26/2023 office visit where he was treated for acute COPD exacerbation with prednisone and levofloxacin.  He returned today with symptoms of worsening shortness of breath associated with fever and nausea rattling cough with orange sputum production he was hypoxic at 84% on 3 L/min oxygen.  He appeared weak and he was also noted to be tachycardic and was sent to the ED.  His workup in the ED was negative for pulmonary embolus.  The CTA chest did show findings of an atypical pneumonia and advanced COPD/emphysema.  Patient reported having some loose stools.    Hospital Course by problem list   Acute respiratory failure with hypoxia  - secondary to COPD exacerbation  - failed outpatient management - now back to home oxygen requirement baseline 3L/min   Hypotension - reduced home BP meds and stopped lasix today  - bolus with IV fluid - may have to add midodrine if persistent   Atypical pneumonia  - seen on CTA chest  - treated with ceftriaxone and azithromycin  - follow blood cultures - no growth   - discharge on oral doxycycline    Leukocytosis - leukemoid reaction from steroids  Sinus tachycardia  - secondary to bronchodilators and illness - treated supportively - reduced diltiazem CD dose in setting of hypotension    NASH liver disease Liver fibrosis Chronic thrombocytopenia  - monitor CBC closely  - hold enoxaparin for platelet less than 100   Type 2 DM  - anticipated some steroid induced hyperglycemia - added SSI and prandial coverage - frequent CBG monitoring ordered      10/10/2023 hosp f/u  f/u ov/[place] office/Dain Laseter re: GOLD 3 copd.02 dep  maint on Breztri   Cc cough   Dyspnea:  walking back in the house on 3lpm but not checking  Cough: changed to dark brown x one week assoc  with nasal congestion Sleeping: 30 degrees HOB / 3 pillows some cough at hs then settles down  and flares in am at usual hour  SABA use:  just hfa * 02: 3lpm 24/7  Has f/u with [name]/ not using nettipot      No obvious day to day or daytime variability or assoc   mucus plugs or hemoptysis or cp or chest tightness, subjective wheeze or overt   hb symptoms.    Also denies any obvious fluctuation of symptoms with weather or environmental changes or other aggravating or alleviating factors except as outlined above   No unusual exposure hx or h/o childhood pna/ asthma or knowledge of premature birth.  Current Allergies, Complete Past Medical History, Past Surgical History, Family History, and Social History were reviewed in [employer] record.  ROS  The following are not active complaints unless bolded Hoarseness, sore throat, dysphagia, dental problems, itching, sneezing,  nasal congestion or discharge of excess mucus or purulent secretions, ear ache,   fever, chills, sweats, unintended wt loss or wt gain, classically pleuritic or exertional cp,  orthopnea pnd or arm/hand swelling  or leg swelling, presyncope, palpitations, abdominal pain, anorexia, nausea, vomiting, diarrhea  or change in bowel habits or change in bladder habits, change in stools or change in urine, dysuria, hematuria,  rash, arthralgias, visual complaints, headache, numbness, weakness or ataxia or problems with walking or coordination,  change in mood or  memory.        Current Meds  Medication Sig   acetaminophen (TYLENOL) 325 MG tablet Take 2 tablets (650 mg total) by mouth every 6 (six) hours as needed for mild pain (pain score 1-3) (or Fever >/= 101).   albuterol (PROVENTIL) (2.5 MG/3ML) 0.083% nebulizer solution Take 3 mLs (2.5 mg total) by nebulization every 2 (two) hours as needed for wheezing or shortness of breath (cough).   albuterol (VENTOLIN HFA) 108 (90 Base) MCG/ACT inhaler Inhale 2 puffs  into the lungs every 4 (four) hours as needed for wheezing or shortness of breath.   ALPRAZolam (XANAX) 0.25 MG tablet Take 0.25 mg by mouth 2 (two) times daily as needed for sleep or anxiety.   atorvastatin (LIPITOR) 40 MG tablet Take 40 mg by mouth in the morning.   BREZTRI AEROSPHERE 160-9-4.8 MCG/ACT AERO INHALE 2 PUFFS INTO THE LUNGS TWICE A DAY   Cholecalciferol (VITAMIN D3) 25 MCG (1000 UT) CAPS Take 1,000 Units by mouth in the morning.   diltiazem (CARDIZEM CD) 240 MG 24 hr capsule Take 240 mg by mouth daily.   famotidine (PEPCID) 20 MG tablet TAKE 1 TABLET BY MOUTH EVERY DAY AFTER SUPPER   ferrous sulfate 325 (65 FE) MG tablet Take 325 mg by mouth in the morning.   fluticasone (FLONASE) 50 MCG/ACT nasal spray Place 1  spray into both nostrils daily as needed for allergies or rhinitis.   furosemide (LASIX) 20 MG tablet TAKE 1 TABLET BY MOUTH EVERY DAY   gabapentin (NEURONTIN) 300 MG capsule Take 1 capsule (300 mg total) by mouth at bedtime.   glimepiride (AMARYL) 1 MG tablet Take 1 mg by mouth daily with breakfast.   guaiFENesin (MUCINEX) 600 MG 12 hr tablet Take 1 tablet (600 mg total) by mouth 2 (two) times daily.   metFORMIN (GLUCOPHAGE) 1000 MG tablet Take 1,000 mg by mouth daily with supper.   Multiple Vitamin (MULTIVITAMIN WITH MINERALS) TABS tablet Take 1 tablet by mouth at bedtime.   OXYGEN Inhale 3 L into the lungs continuous.   pantoprazole (PROTONIX) 40 MG tablet TAKE 1 TABLET (40 MG TOTAL) BY MOUTH IN THE MORNING   pioglitazone (ACTOS) 15 MG tablet Take 15 mg by mouth daily.   pyridOXINE (B-6) 50 MG tablet Take 50 mg by mouth daily.   sertraline (ZOLOFT) 50 MG tablet Take 1 tablet (50 mg total) by mouth daily.   spironolactone (ALDACTONE) 25 MG tablet Take 1 tablet (25 mg total) by mouth daily.   tamsulosin (FLOMAX) 0.4 MG CAPS capsule Take 0.4 mg by mouth in the morning.   vitamin C (ASCORBIC ACID) 250 MG tablet Take 250 mg by mouth in the morning.   Wheat Dextrin  (BENEFIBER PO) Take 1 Capful by mouth daily.   [DISCONTINUED] losartan (COZAAR) 25 MG tablet Take 25 mg by mouth daily.           Past Medical History:  Diagnosis Date   Anxiety    Asthma    Chest pain    a. 2003 Cath: nl cors.   COPD (chronic obstructive pulmonary disease) ([name])    Depression    Diabetes mellitus    Dyslipidemia    Dysrhythmia    Fracture 12/15   lower back "L-1"   GERD (gastroesophageal reflux disease)    Hypertension    PONV (postoperative nausea and vomiting)    Splenomegaly        Objective:    Wt   10/10/2023          168 08/08/2023        167  07/26/2023        162  05/01/2023      170   03/07/2023        167   09/06/2022        163   04/05/2022        156 12/08/2021          164 09/01/2021        167  06/06/2021      162 12/08/2020          163 09/15/2020        154  05/23/2020      166 04/28/2020      166   12/30/19 162 lb (73.5 kg)  12/21/19 162 lb (73.5 kg)  12/09/19 162 lb (73.5 kg)   Vital signs reviewed  10/10/2023  - Note at rest 02 sats  92% on  3lpm CONT    General appearance:    amb wm nad / nasal tone to voice    HEENT :  Oropharynx  clear   Nasal turbinates nl    NECK :  without JVD/Nodes/TM/ nl carotid upstrokes bilaterally   LUNGS: no acc muscle use,  Mod barrel  contour chest wall with bilateral  Distant insp/ exp rhonchi  and  without cough on insp or exp maneuvers and mod  Hyperresonant  to  percussion bilaterally     CV:  RRR  no s3 or murmur or increase in P2, and no edema   ABD:  soft and nontender   MS:   Ext warm without deformities or   obvious joint restrictions , calf tenderness, cyanosis or clubbing  SKIN: warm and dry without lesions    NEURO:  alert, approp, nl sensorium with  no motor or cerebellar deficits apparent.                            Assessment

## 2023-10-07 ENCOUNTER — Telehealth: Payer: Self-pay

## 2023-10-07 NOTE — Telephone Encounter (Signed)
 3 Month Nodule F/U scan scheduled for 10/14/2023.

## 2023-10-08 NOTE — Progress Notes (Deleted)
 Referring Provider: Carylon Perches, MD Primary Care Physician:  Carylon Perches, MD Primary GI Physician: Dr. [name]  No chief complaint on file.   HPI:   [name] is a 68 y.o. male presenting today with a history of with history significant for chronic respiratory failure on supplemental oxygen (2-3L), type 2 diabetes, HTN, GERD, adenomatous colon polyps, hemorrhoids with intermittent rectal bleeding, biopsy-proven NASH fibrosis in 2018, now being followed for cirrhosis,  also following with hematology for thrombocytopenia.  He is presenting today for follow-up of cirrhosis/LE edema.  Diuretics including Lasix 20 mg and spironolactone 25 mg were started in November 2024 due to LE edema.  Last seen in the office 09/02/2023 reporting increased bilateral LE edema for the last few weeks.  Realized that he ran out of spironolactone about 3 weeks ago after he presented to our office.  He was still taking Lasix 20 mg daily.  Denied abdominal distention.  Recommended continuing Lasix and resuming spironolactone 25 mg daily with 2-week follow-up.  May need to consider escalating diuretic therapy depending on clinical response.  Labs 09/11/2023 showed creatinine and electrolytes within normal limits.  Patient canceled his follow-up on 09/18/2023.   Today:    Ascites/peripheral edema:  Diuretics:  Paracentesis: Never History of SBP: No Encephalopathy:  ***  MELD 3.0: 9 on 08/08/2023 [place]: 05/24/2023-no focal liver lesion. AFP: Normal in November 2024. Hep A/B vaccination:  Completed vaccination in 2019 but appears he only received 1 dose. No immunity on labs in 2022. *** EGD: 05/22/2023-no varices, portal hypertensive gastropathy.  Repeat EGD in 2 years. BB: No  Past Medical History:  Diagnosis Date   Anxiety    Arthritis    Asthma    Chest pain    a. 2003 Cath: nl cors.   Cirrhosis ([name])    secondary to MASH   Concussion    COPD (chronic obstructive pulmonary disease) ([name])     chronically on 3-4L [place]   Depression    Diabetes mellitus    Dyslipidemia    Tachycardia   Dyspnea    Dysrhythmia    Fracture 06/2014   lower back "L-1"   GERD (gastroesophageal reflux disease)    Hypertension    NASH (nonalcoholic steatohepatitis) 2018   [name], undergoing routine cirrhosis care; [name]   Pneumonia 12/01/2021   PONV (postoperative nausea and vomiting)    Splenomegaly    Thrombocytopenia ([name])     Past Surgical History:  Procedure Laterality Date   BIOPSY  07/26/2016   Procedure: BIOPSY;  Surgeon: [name], MD;  [facility]: [facility];  Service: Endoscopy;;  gastric   CARDIAC CATHETERIZATION  10/22/2001   normal coronary arteriea (Dr. Aram Candela)   COLONOSCOPY  08/31/2011   [id number] rectal and colon polyps-treated. Single tubular adenoma. Next TCS 08/2016   COLONOSCOPY N/A 07/26/2016   Dr. [name]: diverticulosis in sigmoid and descending colon. 6 mm benign splenic flexure. surveillance 5 yeras   COLONOSCOPY WITH PROPOFOL N/A 08/07/2021   Surgeon: [name], DO;  Nonbleeding internal hemorrhoids, three 3-6 mm polyps in the transverse colon resected and retrieved, otherwise normal exam.  Pathology with 1 tubular adenoma, otherwise benign colonic mucosa.  Recommended 5-year repeat.   ESOPHAGOGASTRODUODENOSCOPY N/A 07/26/2016   Dr. [name]: normal esophagus, portal gastropathy, chronic gastritis, normal duodenum, screening EGD 2 years    ESOPHAGOGASTRODUODENOSCOPY (EGD) WITH ESOPHAGEAL DILATION N/A 05/15/2013   [id number] esophagus-status post Elease Hashimoto dilation/Portal gastropathy. Antral  erosions-status post biopsy. extrinsic compression along lesser curvature likely secondary to splenomegaly. gastric bx benign   ESOPHAGOGASTRODUODENOSCOPY (EGD) WITH PROPOFOL N/A 12/12/2020   Surgeon: [name], MD; ormal esophagus s/p dilation, portal hypertensive gastropathy.  Due for repeat in 2024.    ESOPHAGOGASTRODUODENOSCOPY (EGD) WITH PROPOFOL N/A 05/22/2023   Procedure: ESOPHAGOGASTRODUODENOSCOPY (EGD) WITH PROPOFOL;  Surgeon: [name], MD;  [facility]: [facility];  Service: Endoscopy;  Laterality: N/A;  9:30 am, asa 3   ETHMOIDECTOMY Bilateral 03/02/2022   Procedure: ETHMOIDECTOMY;  Surgeon: [name], MD;  Location: [facility] OR;  Service: ENT;  Laterality: Bilateral;   HERNIA REPAIR  03/2009   MALONEY DILATION N/A 12/12/2020   Procedure: Elease Hashimoto DILATION;  Surgeon: [name], MD;  Location: [facility];  Service: Endoscopy;  Laterality: N/A;   MAXILLARY ANTROSTOMY Bilateral 03/02/2022   Procedure: MAXILLARY ANTROSTOMY;  Surgeon: [name], MD;  Location: [facility] OR;  Service: ENT;  Laterality: Bilateral;   NASAL SEPTOPLASTY W/ TURBINOPLASTY Bilateral 03/02/2022   Procedure: NASAL SEPTOPLASTY WITH TURBINATE REDUCTION;  Surgeon: [name], MD;  Location: [facility] OR;  Service: ENT;  Laterality: Bilateral;   POLYPECTOMY  07/26/2016   Procedure: POLYPECTOMY;  Surgeon: [name], MD;  [facility]: [facility];  Service: Endoscopy;;  colon   POLYPECTOMY  08/07/2021   Procedure: POLYPECTOMY;  Surgeon: [name], DO;  Location: [facility];  Service: Endoscopy;;   SINUS ENDO WITH FUSION N/A 03/02/2022   Procedure: SINUS ENDO WITH FUSION;  Surgeon: [name], MD;  Location: [facility];  Service: ENT;  Laterality: N/A;   TRANSTHORACIC ECHOCARDIOGRAM  2011   EF 50-55%, stage 1 diastolic dysfunction, trace TR & pulm valve regurg    VASECTOMY  1989    Current Outpatient Medications  Medication Sig Dispense Refill   acetaminophen (TYLENOL) 325 MG tablet Take 2 tablets (650 mg total) by mouth every 6 (six) hours as needed for mild pain (pain score 1-3) (or Fever >/= 101).     albuterol (PROVENTIL) (2.5 MG/3ML) 0.083% nebulizer solution Take 3 mLs (2.5 mg total) by nebulization every 2 (two) hours as needed for wheezing or shortness of breath (cough). 75 mL 2   albuterol (VENTOLIN HFA) 108 (90  Base) MCG/ACT inhaler Inhale 2 puffs into the lungs every 4 (four) hours as needed for wheezing or shortness of breath. 8 each 12   ALPRAZolam (XANAX) 0.25 MG tablet Take 0.25 mg by mouth 2 (two) times daily as needed for sleep or anxiety.     atorvastatin (LIPITOR) 40 MG tablet Take 40 mg by mouth in the morning.     BREZTRI AEROSPHERE 160-9-4.8 MCG/ACT AERO INHALE 2 PUFFS INTO THE LUNGS TWICE A DAY 10.7 each 11   Cholecalciferol (VITAMIN D3) 25 MCG (1000 UT) CAPS Take 1,000 Units by mouth in the morning.     diltiazem (CARDIZEM CD) 120 MG 24 hr capsule Take 1 capsule (120 mg total) by mouth in the morning. 30 capsule 1   doxycycline (VIBRAMYCIN) 100 MG capsule Take 100 mg by mouth 2 (two) times daily. Order was to take for 3 days post discharge on 08/10/23. Patient has completed the antibiotic as ordered on medication review. COMPLETED COURSE.     famotidine (PEPCID) 20 MG tablet TAKE 1 TABLET BY MOUTH EVERY DAY AFTER SUPPER 90 tablet 0   ferrous sulfate 325 (65 FE) MG tablet Take 325 mg by mouth in the morning.     fluticasone (FLONASE) 50 MCG/ACT nasal spray  Place 1 spray into both nostrils daily as needed for allergies or rhinitis.     furosemide (LASIX) 20 MG tablet TAKE 1 TABLET BY MOUTH EVERY DAY 90 tablet 1   gabapentin (NEURONTIN) 300 MG capsule Take 1 capsule (300 mg total) by mouth at bedtime. 30 capsule 5   glimepiride (AMARYL) 1 MG tablet Take 1 mg by mouth daily with breakfast.     guaiFENesin (MUCINEX) 600 MG 12 hr tablet Take 1 tablet (600 mg total) by mouth 2 (two) times daily. (Patient not taking: Reported on 09/02/2023) 60 tablet 2   guaiFENesin-codeine 100-10 MG/5ML syrup Take 5 mLs by mouth 3 (three) times daily as needed for cough. (Patient not taking: Reported on 09/02/2023) 120 mL 0   metFORMIN (GLUCOPHAGE) 1000 MG tablet Take 1,000 mg by mouth daily with supper.     Multiple Vitamin (MULTIVITAMIN WITH MINERALS) TABS tablet Take 1 tablet by mouth at bedtime. 30 tablet 2   OXYGEN  Inhale 3 L into the lungs continuous.     pantoprazole (PROTONIX) 40 MG tablet TAKE 1 TABLET (40 MG TOTAL) BY MOUTH IN THE MORNING 90 tablet 1   pioglitazone (ACTOS) 15 MG tablet Take 15 mg by mouth daily.     predniSONE (DELTASONE) 20 MG tablet Take 2 PO QAM x3days,1 PO QAM x3days (Patient not taking: Reported on 09/02/2023) 9 tablet 0   pyridOXINE (B-6) 50 MG tablet Take 50 mg by mouth daily.     sertraline (ZOLOFT) 50 MG tablet Take 1 tablet (50 mg total) by mouth daily. 30 tablet 3   spironolactone (ALDACTONE) 25 MG tablet Take 1 tablet (25 mg total) by mouth daily. 90 tablet 1   tamsulosin (FLOMAX) 0.4 MG CAPS capsule Take 0.4 mg by mouth in the morning.     vitamin C (ASCORBIC ACID) 250 MG tablet Take 250 mg by mouth in the morning.     Wheat Dextrin (BENEFIBER PO) Take 1 Capful by mouth daily.     No current facility-administered medications for this visit.    Allergies as of 10/09/2023 - Review Complete 09/02/2023  Allergen Reaction Noted   Lioresal [baclofen] Other (See Comments) 07/24/2016   Zocor [simvastatin] Nausea Only 10/05/2013    Family History  Problem Relation Age of Onset   Heart disease Mother    Cancer Mother        lymph nodes   Diabetes Mother    Arthritis Other    Lung disease Other    Asthma Other    Kidney disease Other    Ovarian cancer Sister    Diabetes Brother    Heart disease Brother    Hypertension Brother    Diabetes Sister    Diabetes Sister    Cirrhosis Sister 58       no etoh   Colon cancer Neg Hx     Social History   Socioeconomic History   Marital status: Married    Spouse name: Dois Davenport   Number of children: 1   Years of education: Not on file   Highest education level: Not on file  Occupational History   Not on file  Tobacco Use   Smoking status: Former    Current packs/day: 0.00    Average packs/day: 1.3 packs/day for 36.0 years (45.0 ttl pk-yrs)    Types: Cigarettes    Start date: 04/1975    Quit date: 04/2011    Years  since quitting: 12.5   Smokeless tobacco: Never  Vaping Use  Vaping status: Never Used  Substance and Sexual Activity   Alcohol use: No   Drug use: No   Sexual activity: Not Currently  Other Topics Concern   Not on file  Social History Narrative   Not on file   Social Drivers of Health   Financial Resource Strain: Low Risk  (08/16/2020)   Overall Financial Resource Strain (CARDIA)    Difficulty of Paying Living Expenses: Not hard at all  Food Insecurity: No Food Insecurity (08/14/2023)   Hunger Vital Sign    Worried About Running Out of Food in the Last Year: Never true    Ran Out of Food in the Last Year: Never true  Transportation Needs: No Transportation Needs (08/14/2023)   PRAPARE - Administrator, Civil Service (Medical): No    Lack of Transportation (Non-Medical): No  Physical Activity: Sufficiently Active (08/16/2020)   Exercise Vital Sign    Days of Exercise per Week: 7 days    Minutes of Exercise per Session: 30 min  Stress: No Stress Concern Present (08/16/2020)   [employer] of Occupational Health - Occupational Stress Questionnaire    Feeling of Stress : Not at all  Social Connections: Moderately Integrated (08/14/2023)   Social Connection and Isolation Panel [NHANES]    Frequency of Communication with Friends and Family: Three times a week    Frequency of Social Gatherings with Friends and Family: Three times a week    Attends Religious Services: More than 4 times per year    Active Member of Clubs or Organizations: No    Attends Banker Meetings: Never    Marital Status: Married    Review of Systems: Gen: Denies fever, chills, anorexia. Denies fatigue, weakness, weight loss.  CV: Denies chest pain, palpitations, syncope, peripheral edema, and claudication. Resp: Denies dyspnea at rest, cough, wheezing, coughing up blood, and pleurisy. GI: Denies vomiting blood, jaundice, and fecal incontinence.   Denies dysphagia or odynophagia. Derm:  Denies rash, itching, dry skin Psych: Denies depression, anxiety, memory loss, confusion. No homicidal or suicidal ideation.  Heme: Denies bruising, bleeding, and enlarged lymph nodes.  Physical Exam: There were no vitals taken for this visit. General:   Alert and oriented. No distress noted. Pleasant and cooperative.  Head:  Normocephalic and atraumatic. Eyes:  Conjuctiva clear without scleral icterus. Heart:  S1, S2 present without murmurs appreciated. Lungs:  Clear to auscultation bilaterally. No wheezes, rales, or rhonchi. No distress.  Abdomen:  +BS, soft, non-tender and non-distended. No rebound or guarding. No HSM or masses noted. Msk:  Symmetrical without gross deformities. Normal posture. Extremities:  Without edema. Neurologic:  Alert and  oriented x4 Psych:  Normal mood and affect.    Assessment:     Plan:  ***   Ermalinda Memos, PA-C [facility] 10/09/2023

## 2023-10-09 ENCOUNTER — Ambulatory Visit: Admitting: Gastroenterology

## 2023-10-09 NOTE — Telephone Encounter (Signed)
 CT scheduled

## 2023-10-10 ENCOUNTER — Ambulatory Visit: Admitting: Internal Medicine

## 2023-10-10 ENCOUNTER — Encounter: Payer: Self-pay | Admitting: Internal Medicine

## 2023-10-10 VITALS — BP 103/57 | HR 102 | Ht 67.0 in | Wt 168.0 lb

## 2023-10-10 DIAGNOSIS — J9611 Chronic respiratory failure with hypoxia: Secondary | ICD-10-CM

## 2023-10-10 DIAGNOSIS — J449 Chronic obstructive pulmonary disease, unspecified: Secondary | ICD-10-CM | POA: Diagnosis not present

## 2023-10-10 MED ORDER — AMOXICILLIN-POT CLAVULANATE 875-125 MG PO TABS: 1.0000 | ORAL_TABLET | Freq: Two times a day (BID) | ORAL | 0 refills | Status: DC

## 2023-10-10 NOTE — Patient Instructions (Addendum)
 Augmentin 875 mg take one pill twice daily  X 10 days - take at breakfast and supper with large glass of water.  It would help reduce the usual side effects (diarrhea and yeast infections) if you took a  probiotic at lunch.  For cough/ congestion > mucinex or mucinex dm  up to maximum of  1200 mg every 12 hours and use the flutter valve as much as you can    Keep appt with Dr Suszanne Conners  and [facility]   Make sure you check your oxygen saturation  AT  your highest level of activity (not after you stop)   to be sure it stays over 90% and adjust  02 flow upward to maintain this level if needed but remember to turn it back to previous settings when you stop (to conserve your supply).   Please schedule a follow up visit in 3 months but call sooner if needed

## 2023-10-11 NOTE — Assessment & Plan Note (Signed)
 Quit smoking 2012/MM - alpha one AT screen  01/31/17  MM  Level 169 - PFT's  06/18/2019  FEV1 1.12 (35 % ) ratio 0.46  p 0 % improvement from saba p ? prior to study with DLCO  11.91 (48%) corrects to 3.18 (754%)  for alv volume and FV curve classic concavity   - [name] 03/02/22 FESS  - 03/07/2023  continue breztri - 05/01/2023  After extensive coaching inhaler device,  effectiveness =    85% hfa/ continue breztri and approp saba   I believe his flares of copd pna and persistent cough may all be related to poorly controlled rhintis/ sinusitis and not a resistant or unusual organism so rec   Augmentin x 10 day and f/u ent ([name]) p compelte  Mucinex/ flutter valve reviewed  No change breztri and approp saba  No change GERD RX  Q 3 m f/u in pumonary clinic/ sooner prn

## 2023-10-11 NOTE — Assessment & Plan Note (Signed)
 Started on 02 around 2019 As of 02/03/2020  2lpm 24/7  -  02/03/2020 referred to adapt for Best fit for amb 02 > 3lpm pulsed tank -  04/28/2020   Walked 3lpm pulsed tank  approx   400 ft  @ moderate pace  stopped due to  Sob/tired  with sats still 95%    -  09/15/2020   Walked 3lpm cont approx   200 ft  @ slow/slt undsteady  pace  stopped due to  Sob/fatigue   With sats 91% at rest sats were 86% RA   12/08/2020   Walked RA  approx   500 ft  @ moderate pace  stopped due to  End of study, a little sob, sats still 90%  - 04/05/2022 patient walked at a moderate pace on room air all three laps @ 150 ft each. Desat to 87% at end of third lap. Patient placed back on 3LO2 cont at end of walk  - 09/06/2022   Walked on 2lpm cont   x  3  lap(s) =  approx 450  ft  @ moderate pace, stopped due to end of study  with lowest 02 sats 93%   - 03/07/2023   Walked on 3lpm   x  2  lap(s) =  approx 300  ft  @ nl  pace, stopped due to sob with lowest 02 sats 88%   -  05/01/2023   Walked on 2lpom con  x  3  lap(s) =  approx 450  ft  @ mod to slow pace, stopped due to sob  with lowest 02 sats 91%    Presently 3lpm 24/7 but with exertion may need higher flow to keep > 90% at all times  Each maintenance medication was reviewed in detail including emphasizing most importantly the difference between maintenance and prns and under what circumstances the prns are to be triggered using an action plan format where appropriate.  Total time for H and P, chart review, counseling, reviewing hfa/ neb/ 02/pulse ox/ flutter  device(s) , directly observing portions of ambulatory 02 saturation study/ and generating customized AVS unique to this office visit / same day charting = 35 min post hosp pt with   multiple chronic  refractory respiratory  symptoms of uncertain etiology

## 2023-10-14 ENCOUNTER — Ambulatory Visit (HOSPITAL_COMMUNITY)
Admission: RE | Admit: 2023-10-14 | Discharge: 2023-10-14 | Disposition: A | Discharge status: Home / Self Care | Source: Ambulatory Visit | Attending: Internal Medicine | Admitting: Internal Medicine

## 2023-10-14 DIAGNOSIS — R911 Solitary pulmonary nodule: Secondary | ICD-10-CM | POA: Diagnosis not present

## 2023-10-14 DIAGNOSIS — R918 Other nonspecific abnormal finding of lung field: Secondary | ICD-10-CM | POA: Diagnosis not present

## 2023-10-14 DIAGNOSIS — J432 Centrilobular emphysema: Secondary | ICD-10-CM | POA: Diagnosis not present

## 2023-10-16 NOTE — Progress Notes (Unsigned)
 Referring Provider: Carylon Perches, MD Primary Care Physician:  Carylon Perches, MD Primary GI Physician: Dr. [name]  No chief complaint on file.   HPI:   [name] is a 68 y.o. male presenting today with a history of with history significant for chronic respiratory failure on supplemental oxygen (2-3L), type 2 diabetes, HTN, GERD, adenomatous colon polyps, hemorrhoids with intermittent rectal bleeding, biopsy-proven NASH fibrosis in 2018, now being followed for cirrhosis,  also following with hematology for thrombocytopenia.  He is presenting today for follow-up of cirrhosis/LE edema.   Diuretics including Lasix 20 mg and spironolactone 25 mg were started in November 2024 due to LE edema.   Last seen in the office 09/02/2023 reporting increased bilateral LE edema for the last few weeks.  Realized that he ran out of spironolactone about 3 weeks ago after he presented to our office.  He was still taking Lasix 20 mg daily.  Denied abdominal distention.  Recommended continuing Lasix and resuming spironolactone 25 mg daily with 2-week follow-up.  May need to consider escalating diuretic therapy depending on clinical response.   Labs 09/11/2023 showed creatinine and electrolytes within normal limits.   Patient canceled his follow-up on 09/18/2023.     Today:      Ascites/peripheral edema:  Diuretics:  Paracentesis: Never History of SBP: No Encephalopathy:  ***   MELD 3.0: 9 on 08/08/2023 [place]: 05/24/2023-no focal liver lesion. AFP: Normal in November 2024. Hep A/B vaccination:  Completed vaccination in 2019 but appears he only received 1 dose. No immunity on labs in 2022. *** EGD: 05/22/2023-no varices, portal hypertensive gastropathy.  Repeat EGD in 2 years. BB: No  Past Medical History:  Diagnosis Date   Anxiety    Arthritis    Asthma    Chest pain    a. 2003 Cath: nl cors.   Cirrhosis ([name])    secondary to MASH   Concussion    COPD (chronic obstructive pulmonary disease) ([name])     chronically on 3-4L [place]   Depression    Diabetes mellitus    Dyslipidemia    Tachycardia   Dyspnea    Dysrhythmia    Fracture 06/2014   lower back "L-1"   GERD (gastroesophageal reflux disease)    Hypertension    NASH (nonalcoholic steatohepatitis) 2018   [name], undergoing routine cirrhosis care; [name]   Pneumonia 12/01/2021   PONV (postoperative nausea and vomiting)    Splenomegaly    Thrombocytopenia ([name])     Past Surgical History:  Procedure Laterality Date   BIOPSY  07/26/2016   Procedure: BIOPSY;  Surgeon: [name], MD;  [facility]: [facility];  Service: Endoscopy;;  gastric   CARDIAC CATHETERIZATION  10/22/2001   normal coronary arteriea (Dr. Aram Candela)   COLONOSCOPY  08/31/2011   WUJ:[id number] rectal and colon polyps-treated. Single tubular adenoma. Next TCS 08/2016   COLONOSCOPY N/A 07/26/2016   Dr. [name]: diverticulosis in sigmoid and descending colon. 6 mm benign splenic flexure. surveillance 5 yeras   COLONOSCOPY WITH PROPOFOL N/A 08/07/2021   Surgeon: [name], DO;  Nonbleeding internal hemorrhoids, three 3-6 mm polyps in the transverse colon resected and retrieved, otherwise normal exam.  Pathology with 1 tubular adenoma, otherwise benign colonic mucosa.  Recommended 5-year repeat.   ESOPHAGOGASTRODUODENOSCOPY N/A 07/26/2016   Dr. [name]: normal esophagus, portal gastropathy, chronic gastritis, normal duodenum, screening EGD 2 years    ESOPHAGOGASTRODUODENOSCOPY (EGD) WITH ESOPHAGEAL DILATION N/A 05/15/2013  WUJ:WJXBJY esophagus-status post Elease Hashimoto dilation/Portal gastropathy. Antral erosions-status post biopsy. extrinsic compression along lesser curvature likely secondary to splenomegaly. gastric bx benign   ESOPHAGOGASTRODUODENOSCOPY (EGD) WITH PROPOFOL N/A 12/12/2020   Surgeon: [name], MD; ormal esophagus s/p dilation, portal hypertensive gastropathy.  Due for repeat in 2024.    ESOPHAGOGASTRODUODENOSCOPY (EGD) WITH PROPOFOL N/A 05/22/2023   Procedure: ESOPHAGOGASTRODUODENOSCOPY (EGD) WITH PROPOFOL;  Surgeon: [name], MD;  [facility];  Service: Endoscopy;  Laterality: N/A;  9:30 am, asa 3   ETHMOIDECTOMY Bilateral 03/02/2022   Procedure: ETHMOIDECTOMY;  Surgeon: [name], MD;  Location: [facility] OR;  Service: ENT;  Laterality: Bilateral;   HERNIA REPAIR  03/2009   MALONEY DILATION N/A 12/12/2020   Procedure: Elease Hashimoto DILATION;  Surgeon: [name], MD;  [facility]: [facility];  Service: Endoscopy;  Laterality: N/A;   MAXILLARY ANTROSTOMY Bilateral 03/02/2022   Procedure: MAXILLARY ANTROSTOMY;  Surgeon: [name], MD;  Location: [facility] OR;  Service: ENT;  Laterality: Bilateral;   NASAL SEPTOPLASTY W/ TURBINOPLASTY Bilateral 03/02/2022   Procedure: NASAL SEPTOPLASTY WITH TURBINATE REDUCTION;  Surgeon: [name], MD;  Location: [facility] OR;  Service: ENT;  Laterality: Bilateral;   POLYPECTOMY  07/26/2016   Procedure: POLYPECTOMY;  Surgeon: [name], MD;  [facility];  Service: Endoscopy;;  colon   POLYPECTOMY  08/07/2021   Procedure: POLYPECTOMY;  Surgeon: [name], DO;  [facility]: [facility];  Service: Endoscopy;;   SINUS ENDO WITH FUSION N/A 03/02/2022   Procedure: SINUS ENDO WITH FUSION;  Surgeon: [name], MD;  Location: [facility];  Service: ENT;  Laterality: N/A;   TRANSTHORACIC ECHOCARDIOGRAM  2011   EF 50-55%, stage 1 diastolic dysfunction, trace TR & pulm valve regurg    VASECTOMY  1989    Current Outpatient Medications  Medication Sig Dispense Refill   acetaminophen (TYLENOL) 325 MG tablet Take 2 tablets (650 mg total) by mouth every 6 (six) hours as needed for mild pain (pain score 1-3) (or Fever >/= 101).     albuterol (PROVENTIL) (2.5 MG/3ML) 0.083% nebulizer solution Take 3 mLs (2.5 mg total) by nebulization every 2 (two) hours as needed for wheezing or shortness of breath (cough). 75 mL 2   albuterol (VENTOLIN HFA) 108 (90  Base) MCG/ACT inhaler Inhale 2 puffs into the lungs every 4 (four) hours as needed for wheezing or shortness of breath. 8 each 12   ALPRAZolam (XANAX) 0.25 MG tablet Take 0.25 mg by mouth 2 (two) times daily as needed for sleep or anxiety.     amoxicillin-clavulanate (AUGMENTIN) 875-125 MG tablet Take 1 tablet by mouth 2 (two) times daily. 20 tablet 0   atorvastatin (LIPITOR) 40 MG tablet Take 40 mg by mouth in the morning.     BREZTRI AEROSPHERE 160-9-4.8 MCG/ACT AERO INHALE 2 PUFFS INTO THE LUNGS TWICE A DAY 10.7 each 11   Cholecalciferol (VITAMIN D3) 25 MCG (1000 UT) CAPS Take 1,000 Units by mouth in the morning.     diltiazem (CARDIZEM CD) 240 MG 24 hr capsule Take 240 mg by mouth daily.     famotidine (PEPCID) 20 MG tablet TAKE 1 TABLET BY MOUTH EVERY DAY AFTER SUPPER 90 tablet 0   ferrous sulfate 325 (65 FE) MG tablet Take 325 mg by mouth in the morning.     fluticasone (FLONASE) 50 MCG/ACT nasal spray Place 1 spray into both nostrils daily as needed for allergies or rhinitis.     furosemide (LASIX) 20 MG  tablet TAKE 1 TABLET BY MOUTH EVERY DAY 90 tablet 1   gabapentin (NEURONTIN) 300 MG capsule Take 1 capsule (300 mg total) by mouth at bedtime. 30 capsule 5   glimepiride (AMARYL) 1 MG tablet Take 1 mg by mouth daily with breakfast.     guaiFENesin (MUCINEX) 600 MG 12 hr tablet Take 1 tablet (600 mg total) by mouth 2 (two) times daily. 60 tablet 2   metFORMIN (GLUCOPHAGE) 1000 MG tablet Take 1,000 mg by mouth daily with supper.     Multiple Vitamin (MULTIVITAMIN WITH MINERALS) TABS tablet Take 1 tablet by mouth at bedtime. 30 tablet 2   OXYGEN Inhale 3 L into the lungs continuous.     pantoprazole (PROTONIX) 40 MG tablet TAKE 1 TABLET (40 MG TOTAL) BY MOUTH IN THE MORNING 90 tablet 1   pioglitazone (ACTOS) 15 MG tablet Take 15 mg by mouth daily.     pyridOXINE (B-6) 50 MG tablet Take 50 mg by mouth daily.     sertraline (ZOLOFT) 50 MG tablet Take 1 tablet (50 mg total) by mouth daily. 30  tablet 3   spironolactone (ALDACTONE) 25 MG tablet Take 1 tablet (25 mg total) by mouth daily. 90 tablet 1   tamsulosin (FLOMAX) 0.4 MG CAPS capsule Take 0.4 mg by mouth in the morning.     vitamin C (ASCORBIC ACID) 250 MG tablet Take 250 mg by mouth in the morning.     Wheat Dextrin (BENEFIBER PO) Take 1 Capful by mouth daily.     No current facility-administered medications for this visit.    Allergies as of 10/17/2023 - Review Complete 10/10/2023  Allergen Reaction Noted   Lioresal [baclofen] Other (See Comments) 07/24/2016   Zocor [simvastatin] Nausea Only 10/05/2013    Family History  Problem Relation Age of Onset   Heart disease Mother    Cancer Mother        lymph nodes   Diabetes Mother    Arthritis Other    Lung disease Other    Asthma Other    Kidney disease Other    Ovarian cancer Sister    Diabetes Brother    Heart disease Brother    Hypertension Brother    Diabetes Sister    Diabetes Sister    Cirrhosis Sister 32       no etoh   Colon cancer Neg Hx     Social History   Socioeconomic History   Marital status: Married    Spouse name: Dois Davenport   Number of children: 1   Years of education: Not on file   Highest education level: Not on file  Occupational History   Not on file  Tobacco Use   Smoking status: Former    Current packs/day: 0.00    Average packs/day: 1.3 packs/day for 36.0 years (45.0 ttl pk-yrs)    Types: Cigarettes    Start date: 04/1975    Quit date: 04/2011    Years since quitting: 12.5   Smokeless tobacco: Never  Vaping Use   Vaping status: Never Used  Substance and Sexual Activity   Alcohol use: No   Drug use: No   Sexual activity: Not Currently  Other Topics Concern   Not on file  Social History Narrative   Not on file   Social Drivers of Health   Financial Resource Strain: Low Risk  (08/16/2020)   Overall Financial Resource Strain (CARDIA)    Difficulty of Paying Living Expenses: Not hard at all  Food Insecurity:  No Food  Insecurity (08/14/2023)   Hunger Vital Sign    Worried About Running Out of Food in the Last Year: Never true    Ran Out of Food in the Last Year: Never true  Transportation Needs: No Transportation Needs (08/14/2023)   PRAPARE - Administrator, Civil Service (Medical): No    Lack of Transportation (Non-Medical): No  Physical Activity: Sufficiently Active (08/16/2020)   Exercise Vital Sign    Days of Exercise per Week: 7 days    Minutes of Exercise per Session: 30 min  Stress: No Stress Concern Present (08/16/2020)   [employer] of Occupational Health - Occupational Stress Questionnaire    Feeling of Stress : Not at all  Social Connections: Moderately Integrated (08/14/2023)   Social Connection and Isolation Panel [NHANES]    Frequency of Communication with Friends and Family: Three times a week    Frequency of Social Gatherings with Friends and Family: Three times a week    Attends Religious Services: More than 4 times per year    Active Member of Clubs or Organizations: No    Attends Banker Meetings: Never    Marital Status: Married    Review of Systems: Gen: Denies fever, chills, anorexia. Denies fatigue, weakness, weight loss.  CV: Denies chest pain, palpitations, syncope, peripheral edema, and claudication. Resp: Denies dyspnea at rest, cough, wheezing, coughing up blood, and pleurisy. GI: Denies vomiting blood, jaundice, and fecal incontinence.   Denies dysphagia or odynophagia. Derm: Denies rash, itching, dry skin Psych: Denies depression, anxiety, memory loss, confusion. No homicidal or suicidal ideation.  Heme: Denies bruising, bleeding, and enlarged lymph nodes.  Physical Exam: There were no vitals taken for this visit. General:   Alert and oriented. No distress noted. Pleasant and cooperative.  Head:  Normocephalic and atraumatic. Eyes:  Conjuctiva clear without scleral icterus. Heart:  S1, S2 present without murmurs appreciated. Lungs:  Clear  to auscultation bilaterally. No wheezes, rales, or rhonchi. No distress.  Abdomen:  +BS, soft, non-tender and non-distended. No rebound or guarding. No HSM or masses noted. Msk:  Symmetrical without gross deformities. Normal posture. Extremities:  Without edema. Neurologic:  Alert and  oriented x4 Psych:  Normal mood and affect.    Assessment:     Plan:  ***   Ermalinda Memos, PA-C [facility] 10/17/2023

## 2023-10-17 ENCOUNTER — Ambulatory Visit (INDEPENDENT_AMBULATORY_CARE_PROVIDER_SITE_OTHER): Admitting: Gastroenterology

## 2023-10-17 ENCOUNTER — Encounter: Payer: Self-pay | Admitting: Gastroenterology

## 2023-10-17 VITALS — BP 104/62 | HR 105 | Temp 97.7°F | Ht 67.0 in | Wt 171.6 lb

## 2023-10-17 DIAGNOSIS — R600 Localized edema: Secondary | ICD-10-CM | POA: Diagnosis not present

## 2023-10-17 DIAGNOSIS — K7581 Nonalcoholic steatohepatitis (NASH): Secondary | ICD-10-CM | POA: Diagnosis not present

## 2023-10-17 DIAGNOSIS — K7460 Unspecified cirrhosis of liver: Secondary | ICD-10-CM | POA: Diagnosis not present

## 2023-10-17 MED ORDER — FUROSEMIDE 40 MG PO TABS
40.0000 mg | ORAL_TABLET | Freq: Every day | ORAL | 3 refills | Status: DC
Start: 2023-10-17 — End: 2024-01-14

## 2023-10-17 MED ORDER — SPIRONOLACTONE 50 MG PO TABS
50.0000 mg | ORAL_TABLET | Freq: Every day | ORAL | 3 refills | Status: DC
Start: 2023-10-17 — End: 2024-01-14

## 2023-10-17 NOTE — Patient Instructions (Signed)
 Increase Lasix to 40 mg daily and increase spironolactone to 50 mg daily.  I have sent updated prescriptions to your pharmacy.   You will need to have blood work completed 1 week after you start the updated dose of Lasix and spironolactone.  Continue to follow a strict low-sodium diet.  No more than 2000 mg of sodium per day.  I will plan to see you back in the office in 4 weeks or sooner if needed.  Ermalinda Memos, PA-C [facility]

## 2023-10-18 ENCOUNTER — Ambulatory Visit (INDEPENDENT_AMBULATORY_CARE_PROVIDER_SITE_OTHER): Payer: PPO | Admitting: Otolaryngology

## 2023-10-18 ENCOUNTER — Encounter (INDEPENDENT_AMBULATORY_CARE_PROVIDER_SITE_OTHER): Payer: Self-pay

## 2023-10-18 VITALS — BP 108/63 | HR 97 | Ht 67.0 in | Wt 170.0 lb

## 2023-10-18 DIAGNOSIS — R0981 Nasal congestion: Secondary | ICD-10-CM

## 2023-10-18 DIAGNOSIS — J322 Chronic ethmoidal sinusitis: Secondary | ICD-10-CM | POA: Diagnosis not present

## 2023-10-18 DIAGNOSIS — J310 Chronic rhinitis: Secondary | ICD-10-CM | POA: Diagnosis not present

## 2023-10-18 DIAGNOSIS — J320 Chronic maxillary sinusitis: Secondary | ICD-10-CM

## 2023-10-18 NOTE — Progress Notes (Signed)
 Patient ID: [name], male   DOB: 07/11/1955, 68 y.o.   MRN: [id number]  Follow-up: Chronic maxillary and ethmoid sinusitis/polyposis  HPI: The patient is a 68 year old male who returns today for his follow-up evaluation.  The patient has a history of chronic maxillary and ethmoid sinusitis and polyposis.  He underwent bilateral endoscopic sinus surgery, septoplasty, and turbinate reduction in August 2023.  The patient returns today reporting recent sinus pressure.  He is currently on amoxicillin.  He has no other sinusitis since his last visit.  Exam: General: Communicates without difficulty, well nourished, no acute distress. Head: Normocephalic, no evidence injury, no tenderness, facial buttresses intact without stepoff. Face/sinus: No tenderness to palpation and percussion. Facial movement is normal and symmetric. Eyes: PERRL, EOMI. No scleral icterus, conjunctivae clear. Neuro: CN II exam reveals vision grossly intact.  No nystagmus at any point of gaze. Ears: Auricles well formed without lesions.  Ear canals are intact without mass or lesion.  No erythema or edema is appreciated.  The TMs are intact without fluid. Nose: External evaluation reveals normal support and skin without lesions.  Dorsum is intact.  Anterior rhinoscopy reveals congested mucosa over anterior aspect of inferior turbinates and intact septum.  No purulence noted. Oral:  Oral cavity and oropharynx are intact, symmetric, without erythema or edema.  Mucosa is moist without lesions. Neck: Full range of motion without pain.  There is no significant lymphadenopathy.  No masses palpable.  Thyroid bed within normal limits to palpation.  Parotid glands and submandibular glands equal bilaterally without mass.  Trachea is midline. Neuro:  CN 2-12 grossly intact.   Assessment: 1.  Chronic rhinitis with nasal mucosal congestion. 2.  The patient's maxillary and ethmoid sinuses are patent secondary to his previous surgery. 3.  No  recurrent polyposis or acute infection is noted today.  Plan: 1.  The physical exam findings are reviewed with the patient. 2.  Complete his current course of amoxicillin. 3.  Flonase nasal spray daily. 4.  Nasal saline irrigation as needed. 5.  The patient will return for reevaluation in 6 months.

## 2023-10-23 ENCOUNTER — Encounter: Payer: Self-pay | Admitting: Gastroenterology

## 2023-11-05 DIAGNOSIS — R600 Localized edema: Secondary | ICD-10-CM | POA: Diagnosis not present

## 2023-11-05 DIAGNOSIS — K7460 Unspecified cirrhosis of liver: Secondary | ICD-10-CM | POA: Diagnosis not present

## 2023-11-06 LAB — BASIC METABOLIC PANEL WITH GFR
BUN/Creatinine Ratio: 28 — ABNORMAL HIGH (ref 10–24)
BUN: 35 mg/dL — ABNORMAL HIGH (ref 8–27)
CO2: 25 mmol/L (ref 20–29)
Calcium: 10.4 mg/dL — ABNORMAL HIGH (ref 8.6–10.2)
Chloride: 93 mmol/L — ABNORMAL LOW (ref 96–106)
Creatinine, Ser: 1.25 mg/dL (ref 0.76–1.27)
Glucose: 320 mg/dL — ABNORMAL HIGH (ref 70–99)
Potassium: 5.1 mmol/L (ref 3.5–5.2)
Sodium: 135 mmol/L (ref 134–144)
eGFR: 63 mL/min/{1.73_m2} (ref >59)

## 2023-11-12 ENCOUNTER — Other Ambulatory Visit: Payer: Self-pay | Admitting: *Deleted

## 2023-11-12 DIAGNOSIS — K7460 Unspecified cirrhosis of liver: Secondary | ICD-10-CM | POA: Diagnosis not present

## 2023-11-12 DIAGNOSIS — E1129 Type 2 diabetes mellitus with other diabetic kidney complication: Secondary | ICD-10-CM | POA: Diagnosis not present

## 2023-11-13 ENCOUNTER — Other Ambulatory Visit: Payer: Self-pay

## 2023-11-13 LAB — BASIC METABOLIC PANEL WITH GFR
BUN/Creatinine Ratio: 17 (ref 10–24)
BUN: 15 mg/dL (ref 8–27)
CO2: 26 mmol/L (ref 20–29)
Calcium: 9.1 mg/dL (ref 8.6–10.2)
Chloride: 99 mmol/L (ref 96–106)
Creatinine, Ser: 0.89 mg/dL (ref 0.76–1.27)
Glucose: 236 mg/dL — ABNORMAL HIGH (ref 70–99)
Potassium: 4.6 mmol/L (ref 3.5–5.2)
Sodium: 139 mmol/L (ref 134–144)
eGFR: 93 mL/min/{1.73_m2} (ref >59)

## 2023-11-14 ENCOUNTER — Telehealth: Payer: Self-pay | Admitting: Acute Care

## 2023-11-14 NOTE — Telephone Encounter (Signed)
 I have attempted to call the patient with the results of their  Low Dose CT Chest Lung cancer screening scan. There was no answer. I have left a HIPPA compliant VM requesting the patient call the office for the scan results. I included the office contact information in the message. We will await his return call. If no return call we will continue to call until patient is contacted.    Ladies, please asked this patient if he would like to be referred to see Dr. Waylan Haggard or [facility] regarding the finding of interstitial lung disease. Additionally he has atherosclerotic lesions in his heart ,enlarged pulmonary trunk and minimally enlarged heart ,  emphysema, if he has not already been referred to cardiology I think perhaps he may need to be.  I do not see an echo in his history.  He is on statin therapy.  Please asked patient if he would like referral to cardiology for evaluation.  Please fax results to PCP let them know original scan was read as a lung RADS 2 annual follow-up which will be due in April 2026.  Please order low-dose CT.  Thanks so much

## 2023-11-14 NOTE — Progress Notes (Signed)
 Referring Provider: Artemisa Bile, MD Primary Care Physician:  Artemisa Bile, MD Primary GI Physician: Dr. Riley Cheadle  Chief Complaint  Patient presents with   Follow-up    Here for cirrhosis follow up, states that the increase in the spironolactone  and furosemide  is making him feel weak/tired, more so than he did before the increase.     HPI:   [name] is a 68 y.o. male presenting today with history significant for chronic respiratory failure on supplemental oxygen  (2-3L), type 2 diabetes, HTN, GERD, adenomatous colon polyps, hemorrhoids with intermittent rectal bleeding, biopsy-proven NASH fibrosis in 2018, now being followed for cirrhosis,  also following with hematology for thrombocytopenia.  He is presenting today for follow-up of cirrhosis/LE edema.   Diuretics including Lasix  20 mg and spironolactone  25 mg were started in November 2024 due to LE edema.   Lasix  increased to 40 mg daily and spironolactone  to 50 mg daily due to 2+ pedal edema and 1+ edema up to knees on 10/17/23.   BMP 4/29 with BUN 35, potassium 5.1, sodium 135, creatinine 1.25 which was up from 0.87.  Calcium  10.4.  BMP 11/12/2023 with BUN 15, creatinine 0.89, sodium 139, potassium 4.6, calcium  9.1.   Today:  For the last 2 weeks, he has been feeling more tired, having more cramping in his legs.  He did increase his water  intake.  Not sure if this is related to the fluid pills, but also notes that his blood sugars have been much higher than normal ranging in the upper 200s.  He has follow-up with Dr. Hildy Lowers next week.  A1c on 11/12/23 was 9.1.  States has not changed anything.  He is taking his medications as prescribed. No new medications.  He does eat a lot of fruit, but this is not necessarily new.  Swelling improved some, but still there. Just mostly in his feet.  Denies any increase in abdominal distention. No mental status changes/confusion.  Bowels are moving well, 1-2 BMs per day.  No BRBPR or melena.  No  abdominal pain, nausea, vomiting, heartburn symptoms.   Paracentesis: Never History of SBP: No Encephalopathy:  None.    MELD 3.0: 9 on 08/08/2023 US : 05/24/2023-no focal liver lesion. AFP: Normal in November 2024. Hep A/B vaccination:  Completed vaccination in 2019 but appears he only received 1 dose. No immunity on labs in 2022.  EGD: 05/22/2023-no varices, portal hypertensive gastropathy.  Repeat EGD in 2 years. BB: No   Past Medical History:  Diagnosis Date   Anxiety    Arthritis    Asthma    Chest pain    a. 2003 Cath: nl cors.   Cirrhosis ([name])    secondary to MASH   Concussion    COPD (chronic obstructive pulmonary disease) ([name])    chronically on 3-4L [place]   Depression    Diabetes mellitus    Dyslipidemia    Tachycardia   Dyspnea    Dysrhythmia    Fracture 06/2014   lower back "L-1"   GERD (gastroesophageal reflux disease)    Hypertension    NASH (nonalcoholic steatohepatitis) 2018   [name], undergoing routine cirrhosis care; [name]   Pneumonia 12/01/2021   PONV (postoperative nausea and vomiting)    Splenomegaly    Thrombocytopenia ([name])     Past Surgical History:  Procedure Laterality Date   BIOPSY  07/26/2016   Procedure: BIOPSY;  Surgeon: Suzette Espy, MD;  [facility];  Service: Endoscopy;;  gastric   CARDIAC CATHETERIZATION  10/22/2001   normal coronary arteriea (Dr. Laymon Priest)   COLONOSCOPY  08/31/2011   [id number] rectal and colon polyps-treated. Single tubular adenoma. Next TCS 08/2016   COLONOSCOPY N/A 07/26/2016   Dr. Riley Cheadle: diverticulosis in sigmoid and descending colon. 6 mm benign splenic flexure. surveillance 5 yeras   COLONOSCOPY WITH PROPOFOL  N/A 08/07/2021   Surgeon: Goble Last K, DO;  Nonbleeding internal hemorrhoids, three 3-6 mm polyps in the transverse colon resected and retrieved, otherwise normal exam.  Pathology with 1 tubular adenoma, otherwise benign colonic  mucosa.  Recommended 5-year repeat.   ESOPHAGOGASTRODUODENOSCOPY N/A 07/26/2016   Dr. Riley Cheadle: normal esophagus, portal gastropathy, chronic gastritis, normal duodenum, screening EGD 2 years    ESOPHAGOGASTRODUODENOSCOPY (EGD) WITH ESOPHAGEAL DILATION N/A 05/15/2013   [id number] esophagus-status post Londa Rival dilation/Portal gastropathy. Antral erosions-status post biopsy. extrinsic compression along lesser curvature likely secondary to splenomegaly. gastric bx benign   ESOPHAGOGASTRODUODENOSCOPY (EGD) WITH PROPOFOL  N/A 12/12/2020   Surgeon: Suzette Espy, MD; ormal esophagus s/p dilation, portal hypertensive gastropathy.  Due for repeat in 2024.   ESOPHAGOGASTRODUODENOSCOPY (EGD) WITH PROPOFOL  N/A 05/22/2023   Procedure: ESOPHAGOGASTRODUODENOSCOPY (EGD) WITH PROPOFOL ;  Surgeon: Suzette Espy, MD;  [facility];  Service: Endoscopy;  Laterality: N/A;  9:30 am, asa 3   ETHMOIDECTOMY Bilateral 03/02/2022   Procedure: ETHMOIDECTOMY;  Surgeon: Reynold Caves, MD;  Location: [facility] OR;  Service: ENT;  Laterality: Bilateral;   HERNIA REPAIR  03/2009   MALONEY DILATION N/A 12/12/2020   Procedure: Londa Rival DILATION;  Surgeon: Suzette Espy, MD;  [facility];  Service: Endoscopy;  Laterality: N/A;   MAXILLARY ANTROSTOMY Bilateral 03/02/2022   Procedure: MAXILLARY ANTROSTOMY;  Surgeon: Reynold Caves, MD;  Location: [facility];  Service: ENT;  Laterality: Bilateral;   NASAL SEPTOPLASTY W/ TURBINOPLASTY Bilateral 03/02/2022   Procedure: NASAL SEPTOPLASTY WITH TURBINATE REDUCTION;  Surgeon: Reynold Caves, MD;  Location: [facility];  Service: ENT;  Laterality: Bilateral;   POLYPECTOMY  07/26/2016   Procedure: POLYPECTOMY;  Surgeon: Suzette Espy, MD;  [facility];  Service: Endoscopy;;  colon   POLYPECTOMY  08/07/2021   Procedure: POLYPECTOMY;  Surgeon: Vinetta Greening, DO;  [facility];  Service: Endoscopy;;   SINUS ENDO WITH FUSION N/A 03/02/2022   Procedure: SINUS ENDO WITH FUSION;   Surgeon: Reynold Caves, MD;  Location: [facility];  Service: ENT;  Laterality: N/A;   TRANSTHORACIC ECHOCARDIOGRAM  2011   EF 50-55%, stage 1 diastolic dysfunction, trace TR & pulm valve regurg    VASECTOMY  1989    Current Outpatient Medications  Medication Sig Dispense Refill   acetaminophen  (TYLENOL ) 325 MG tablet Take 2 tablets (650 mg total) by mouth every 6 (six) hours as needed for mild pain (pain score 1-3) (or Fever >/= 101).     albuterol  (PROVENTIL ) (2.5 MG/3ML) 0.083% nebulizer solution Take 3 mLs (2.5 mg total) by nebulization every 2 (two) hours as needed for wheezing or shortness of breath (cough). 75 mL 2   albuterol  (VENTOLIN  HFA) 108 (90 Base) MCG/ACT inhaler Inhale 2 puffs into the lungs every 4 (four) hours as needed for wheezing or shortness of breath. 8 each 12   ALPRAZolam  (XANAX ) 0.25 MG tablet Take 0.25 mg by mouth 2 (two) times daily as needed for sleep or anxiety.     atorvastatin  (LIPITOR) 40 MG tablet Take 40 mg by mouth in the morning.     BREZTRI   AEROSPHERE 160-9-4.8 MCG/ACT AERO INHALE 2 PUFFS INTO THE LUNGS TWICE A DAY 10.7 each 11   Cholecalciferol  (VITAMIN D3) 25 MCG (1000 UT) CAPS Take 1,000 Units by mouth in the morning.     diltiazem  (CARDIZEM  CD) 240 MG 24 hr capsule Take 240 mg by mouth daily.     famotidine  (PEPCID ) 20 MG tablet TAKE 1 TABLET BY MOUTH EVERY DAY AFTER SUPPER 90 tablet 0   ferrous sulfate  325 (65 FE) MG tablet Take 325 mg by mouth in the morning.     fluticasone  (FLONASE ) 50 MCG/ACT nasal spray Place 1 spray into both nostrils daily as needed for allergies or rhinitis.     furosemide  (LASIX ) 40 MG tablet Take 1 tablet (40 mg total) by mouth daily. 30 tablet 3   gabapentin  (NEURONTIN ) 300 MG capsule Take 1 capsule (300 mg total) by mouth at bedtime. 30 capsule 5   glimepiride  (AMARYL ) 1 MG tablet Take 1 mg by mouth daily with breakfast.     metFORMIN  (GLUCOPHAGE ) 1000 MG tablet Take 1,000 mg by mouth daily with supper.     Multiple Vitamin  (MULTIVITAMIN WITH MINERALS) TABS tablet Take 1 tablet by mouth at bedtime. 30 tablet 2   OXYGEN  Inhale 3 L into the lungs continuous.     pantoprazole  (PROTONIX ) 40 MG tablet TAKE 1 TABLET (40 MG TOTAL) BY MOUTH IN THE MORNING 90 tablet 1   pioglitazone (ACTOS) 15 MG tablet Take 15 mg by mouth daily.     Probiotic Product (PROBIOTIC DAILY PO) Take by mouth.     pyridOXINE (B-6) 50 MG tablet Take 50 mg by mouth daily.     sertraline  (ZOLOFT ) 50 MG tablet Take 1 tablet (50 mg total) by mouth daily. 30 tablet 3   spironolactone  (ALDACTONE ) 50 MG tablet Take 1 tablet (50 mg total) by mouth daily. 30 tablet 3   tamsulosin  (FLOMAX ) 0.4 MG CAPS capsule Take 0.4 mg by mouth in the morning.     vitamin C  (ASCORBIC ACID ) 250 MG tablet Take 250 mg by mouth in the morning.     Wheat Dextrin (BENEFIBER PO) Take 1 Capful by mouth daily.     No current facility-administered medications for this visit.    Allergies as of 11/15/2023 - Review Complete 11/15/2023  Allergen Reaction Noted   Lioresal [baclofen] Other (See Comments) 07/24/2016   Zocor [simvastatin] Nausea Only 10/05/2013    Family History  Problem Relation Age of Onset   Heart disease Mother    Cancer Mother        lymph nodes   Diabetes Mother    Arthritis Other    Lung disease Other    Asthma Other    Kidney disease Other    Ovarian cancer Sister    Diabetes Brother    Heart disease Brother    Hypertension Brother    Diabetes Sister    Diabetes Sister    Cirrhosis Sister 17       no etoh   Colon cancer Neg Hx     Social History   Socioeconomic History   Marital status: Married    Spouse name: Adell Hones   Number of children: 1   Years of education: Not on file   Highest education level: Not on file  Occupational History   Not on file  Tobacco Use   Smoking status: Former    Current packs/day: 0.00    Average packs/day: 1.3 packs/day for 36.0 years (45.0 ttl pk-yrs)  Types: Cigarettes    Start date: 04/1975     Quit date: 04/2011    Years since quitting: 12.6   Smokeless tobacco: Never  Vaping Use   Vaping status: Never Used  Substance and Sexual Activity   Alcohol use: No   Drug use: No   Sexual activity: Not Currently  Other Topics Concern   Not on file  Social History Narrative   Not on file   Social Drivers of Health   Financial Resource Strain: Low Risk  (08/16/2020)   Overall Financial Resource Strain (CARDIA)    Difficulty of Paying Living Expenses: Not hard at all  Food Insecurity: No Food Insecurity (08/14/2023)   Hunger Vital Sign    Worried About Running Out of Food in the Last Year: Never true    Ran Out of Food in the Last Year: Never true  Transportation Needs: No Transportation Needs (08/14/2023)   PRAPARE - Administrator, Civil Service (Medical): No    Lack of Transportation (Non-Medical): No  Physical Activity: Sufficiently Active (08/16/2020)   Exercise Vital Sign    Days of Exercise per Week: 7 days    Minutes of Exercise per Session: 30 min  Stress: No Stress Concern Present (08/16/2020)   [employer] of Occupational Health - Occupational Stress Questionnaire    Feeling of Stress : Not at all  Social Connections: Moderately Integrated (08/14/2023)   Social Connection and Isolation Panel [NHANES]    Frequency of Communication with Friends and Family: Three times a week    Frequency of Social Gatherings with Friends and Family: Three times a week    Attends Religious Services: More than 4 times per year    Active Member of Clubs or Organizations: No    Attends Banker Meetings: Never    Marital Status: Married    Review of Systems: Gen: Denies fever, chills, cold or flulike symptoms, presyncope, syncope. CV: Denies chest pain, palpitations. Resp: Denies dyspnea, cough.  GI: See HPI Heme: See HPI  Physical Exam: BP 120/71 (BP Location: Right Arm, Patient Position: Sitting, Cuff Size: Normal)   Pulse (!) 109   Temp 97.8 F (36.6 C)  (Oral)   Ht 5\' 7"  (1.702 m)   Wt 167 lb (75.8 kg)   SpO2 93%   BMI 26.16 kg/m  General:   Alert and oriented. No distress noted. Pleasant and cooperative.  Head:  Normocephalic and atraumatic. Eyes:  Conjuctiva clear without scleral icterus. Heart:  S1, S2 present without murmurs appreciated. Lungs:  Clear to auscultation bilaterally. No wheezes, rales, or rhonchi. No distress.  Abdomen:  +BS, distended with large AP diameter (chronic), soft, non-tender. No rebound or guarding.  Msk:  Symmetrical without gross deformities. Normal posture. Extremities:  With 2+ pedal edema, 1+ ankle edema bilaterally.  Neurologic:  Alert and  oriented x4 Psych:  Normal mood and affect.    Assessment:  68 y.o. male presenting today with history significant for chronic respiratory failure on supplemental oxygen  (2-3L), type 2 diabetes, HTN, GERD, adenomatous colon polyps, hemorrhoids with intermittent rectal bleeding, biopsy-proven NASH fibrosis in 2018, now being followed for cirrhosis,  also following with hematology for thrombocytopenia.  He is presenting today for follow-up of cirrhosis/LE edema.   Cirrhosis/LE edema:  Secondary to MASH. MELD 3.0 was 9 in January.  EGD up to date with PHG, no varices, due for surveillance in November 2026. Currently due for US  for [facility] screening. Regarding hepatitis A/B immunization. Patient was vaccinated  2019, but labs in 2022 showed no immunity. Recommended receiving hepatitis A/B vaccination   Clinically, patient has been doing well aside from development of LE edema, started on Lasix  20 mg and spironolactone  25 mg daily in November 2024 with clinical improvement. Lasix  increased to 40 mg daily and spironolactone  to 50 mg daily due to 2+ pedal edema and 1+ edema up to knees on 10/17/23. Peripheral edema improved but still with 2+ pedal edema and 1+ ankle edema; however, due to patient reporting increasing fatigue and intermittent leg cramps, we will hold off on  adjusting diuretics at this time.  Most recent labs 11/12/2023 showed normal electrolytes and kidney function.  I will check magnesium levels for him.  Fatigue: New onset over the last 2 weeks.  Etiology unclear.  No medication changes recently aside from increasing diuretics, but labs on 11/12/2023 showed normal electrolytes and creatinine.  He is currently dealing with uncontrolled diabetes which is a new issue for him.  This could be contributing to his fatigue.  He has follow-up with PCP next week regarding this.  Notably, A1c on 5/6 was 9.1.  He denies any other symptoms such as cold or flulike symptoms, overt GI bleeding, chest pain, worsening shortness of breath.  I will go ahead and update CBC, HFP, INR, B12.  We discussed the possibility of decreasing diuretics to see if he has any improvement in his symptoms, but patient states he will continue his current medications for now.   Plan:  CBC, HFP, INR, magnesium, B12 Abdominal ultrasound for HC screening and assess for any degree of ascites as in setting of large AP diameter, it is hard to tell and patient's wife asking about this.  Continue taking Lasix  40 mg and spironolactone  50 mg daily for now. Patient will let me know if he has any worsening fatigue or cramps and would like to decrease dosage.  Complete hepatitis A/B vaccine series. Nutrition:  High-protein diet from a primarily plant-based diet. Avoid red meat.  No raw or undercooked meat, seafood, or shellfish. Low-fat/cholesterol/carbohydrate diet. Limit sodium to no more than 2000 mg/day including everything that you eat and drink. Recommend at least 30 minutes of aerobic and resistance exercise 3 days/week as he is able.  Keep upcoming appointment with PCP for diabetes management. Follow-up in 3 months or sooner if needed.    Shana Daring, PA-C [facility] 11/15/2023

## 2023-11-15 ENCOUNTER — Other Ambulatory Visit: Payer: Self-pay

## 2023-11-15 ENCOUNTER — Ambulatory Visit (INDEPENDENT_AMBULATORY_CARE_PROVIDER_SITE_OTHER): Admitting: Gastroenterology

## 2023-11-15 ENCOUNTER — Encounter: Payer: Self-pay | Admitting: Gastroenterology

## 2023-11-15 ENCOUNTER — Encounter: Payer: Self-pay | Admitting: *Deleted

## 2023-11-15 VITALS — BP 120/71 | HR 109 | Temp 97.8°F | Ht 67.0 in | Wt 167.0 lb

## 2023-11-15 DIAGNOSIS — K7581 Nonalcoholic steatohepatitis (NASH): Secondary | ICD-10-CM | POA: Diagnosis not present

## 2023-11-15 DIAGNOSIS — K7460 Unspecified cirrhosis of liver: Secondary | ICD-10-CM

## 2023-11-15 DIAGNOSIS — Z122 Encounter for screening for malignant neoplasm of respiratory organs: Secondary | ICD-10-CM

## 2023-11-15 DIAGNOSIS — R5383 Other fatigue: Secondary | ICD-10-CM | POA: Diagnosis not present

## 2023-11-15 DIAGNOSIS — R600 Localized edema: Secondary | ICD-10-CM

## 2023-11-15 DIAGNOSIS — F17210 Nicotine dependence, cigarettes, uncomplicated: Secondary | ICD-10-CM

## 2023-11-15 DIAGNOSIS — Z87891 Personal history of nicotine dependence: Secondary | ICD-10-CM

## 2023-11-15 NOTE — Telephone Encounter (Signed)
 Called and spoke with patient. Reviewed recent CT results. He is in agreement to annual Lung CT. He would like a referral to a Cardiology office that has a location in [place], if possible. He is taking a statin however he has not had an echo. Pt is open to seeing Dr. Waylan Haggard or Dr. Aubery Blare to be evaluated for ILD. Advises he just had a visit with Dr. Waymond Hailey 10/2023. Next f/u due for July. Patient would like to know if he could discuss this with Dr. Waymond Hailey at his f/u appt prior to being scheduled with another provider. Pt is willing to do either. Results sent to PCP and annual order placed.   Sarah-Can you send a cardiology referral for him? I did tell him that [facility] has an office in [place].   Margretta Shi, RN

## 2023-11-15 NOTE — Patient Instructions (Addendum)
 Please have labs completed at [facility].   We will arrange for you to have an ultrasound of your abdomen.   Continue taking Lasix  40 mg and spironolactone  50 mg daily for now. If you start feeling worse, increased cramping, etc, let me know and we can try reducing the fluid pills to see if this is the cause.   Have Hepatitis A/B vaccine completed.   Nutrition:  High-protein diet from a primarily plant-based diet. Avoid red meat.  No raw or undercooked meat, seafood, or shellfish. Low-fat/cholesterol/carbohydrate diet. Limit sodium to no more than 2000 mg/day including everything that you eat and drink. Recommend at least 30 minutes of aerobic and resistance exercise 3 days/week.  I will see you back in 3 months or sooner if needed.   Shana Daring, PA-C [facility]

## 2023-11-19 DIAGNOSIS — R5383 Other fatigue: Secondary | ICD-10-CM | POA: Diagnosis not present

## 2023-11-19 DIAGNOSIS — K7460 Unspecified cirrhosis of liver: Secondary | ICD-10-CM | POA: Diagnosis not present

## 2023-11-20 DIAGNOSIS — E1122 Type 2 diabetes mellitus with diabetic chronic kidney disease: Secondary | ICD-10-CM | POA: Diagnosis not present

## 2023-11-20 DIAGNOSIS — J440 Chronic obstructive pulmonary disease with acute lower respiratory infection: Secondary | ICD-10-CM | POA: Diagnosis not present

## 2023-11-20 LAB — CBC WITH DIFFERENTIAL/PLATELET
Basophils Absolute: 0 10*3/uL (ref 0.0–0.2)
Basos: 1 %
EOS (ABSOLUTE): 0.2 10*3/uL (ref 0.0–0.4)
Eos: 3 %
Hematocrit: 44.5 % (ref 37.5–51.0)
Hemoglobin: 14.1 g/dL (ref 13.0–17.7)
Immature Grans (Abs): 0 10*3/uL (ref 0.0–0.1)
Immature Granulocytes: 0 %
Lymphocytes Absolute: 1.5 10*3/uL (ref 0.7–3.1)
Lymphs: 27 %
MCH: 27.8 pg (ref 26.6–33.0)
MCHC: 31.7 g/dL (ref 31.5–35.7)
MCV: 88 fL (ref 79–97)
Monocytes Absolute: 0.6 10*3/uL (ref 0.1–0.9)
Monocytes: 10 %
Neutrophils Absolute: 3.2 10*3/uL (ref 1.4–7.0)
Neutrophils: 59 %
Platelets: 108 10*3/uL — ABNORMAL LOW (ref 150–450)
RBC: 5.08 x10E6/uL (ref 4.14–5.80)
RDW: 14.1 % (ref 11.6–15.4)
WBC: 5.5 10*3/uL (ref 3.4–10.8)

## 2023-11-20 LAB — HEPATIC FUNCTION PANEL
ALT: 78 IU/L — ABNORMAL HIGH (ref 0–44)
AST: 40 IU/L (ref 0–40)
Albumin: 4.7 g/dL (ref 3.9–4.9)
Alkaline Phosphatase: 139 IU/L — ABNORMAL HIGH (ref 44–121)
Bilirubin Total: 0.6 mg/dL (ref 0.0–1.2)
Bilirubin, Direct: 0.21 mg/dL (ref 0.00–0.40)
Total Protein: 7.9 g/dL (ref 6.0–8.5)

## 2023-11-20 LAB — PROTIME-INR
INR: 1 (ref 0.9–1.2)
Prothrombin Time: 11.1 s (ref 9.1–12.0)

## 2023-11-20 LAB — MAGNESIUM: Magnesium: 1.7 mg/dL (ref 1.6–2.3)

## 2023-11-20 LAB — VITAMIN B12: Vitamin B-12: 1821 pg/mL — ABNORMAL HIGH (ref 232–1245)

## 2023-11-21 ENCOUNTER — Ambulatory Visit: Payer: Self-pay | Admitting: Gastroenterology

## 2023-11-21 DIAGNOSIS — D485 Neoplasm of uncertain behavior of skin: Secondary | ICD-10-CM | POA: Diagnosis not present

## 2023-11-21 DIAGNOSIS — Q825 Congenital non-neoplastic nevus: Secondary | ICD-10-CM | POA: Diagnosis not present

## 2023-11-21 DIAGNOSIS — L570 Actinic keratosis: Secondary | ICD-10-CM | POA: Diagnosis not present

## 2023-11-21 DIAGNOSIS — L814 Other melanin hyperpigmentation: Secondary | ICD-10-CM | POA: Diagnosis not present

## 2023-11-21 DIAGNOSIS — L820 Inflamed seborrheic keratosis: Secondary | ICD-10-CM | POA: Diagnosis not present

## 2023-11-25 ENCOUNTER — Ambulatory Visit (HOSPITAL_COMMUNITY)
Admission: RE | Admit: 2023-11-25 | Discharge: 2023-11-25 | Disposition: A | Discharge status: Home / Self Care | Source: Ambulatory Visit | Attending: Gastroenterology | Admitting: Gastroenterology

## 2023-11-25 DIAGNOSIS — K7460 Unspecified cirrhosis of liver: Secondary | ICD-10-CM | POA: Diagnosis not present

## 2023-11-25 DIAGNOSIS — R161 Splenomegaly, not elsewhere classified: Secondary | ICD-10-CM | POA: Diagnosis not present

## 2023-12-06 DIAGNOSIS — K7460 Unspecified cirrhosis of liver: Secondary | ICD-10-CM | POA: Diagnosis not present

## 2023-12-06 DIAGNOSIS — R531 Weakness: Secondary | ICD-10-CM | POA: Diagnosis not present

## 2023-12-06 DIAGNOSIS — R5382 Chronic fatigue, unspecified: Secondary | ICD-10-CM | POA: Diagnosis not present

## 2023-12-06 DIAGNOSIS — J449 Chronic obstructive pulmonary disease, unspecified: Secondary | ICD-10-CM | POA: Diagnosis not present

## 2023-12-06 DIAGNOSIS — R945 Abnormal results of liver function studies: Secondary | ICD-10-CM | POA: Diagnosis not present

## 2023-12-06 DIAGNOSIS — Z79899 Other long term (current) drug therapy: Secondary | ICD-10-CM | POA: Diagnosis not present

## 2023-12-08 ENCOUNTER — Other Ambulatory Visit: Payer: Self-pay | Admitting: Internal Medicine

## 2023-12-09 ENCOUNTER — Telehealth: Payer: Self-pay

## 2023-12-09 MED ORDER — ALBUTEROL SULFATE HFA 108 (90 BASE) MCG/ACT IN AERS: 2.0000 | INHALATION_SPRAY | RESPIRATORY_TRACT | 2 refills | Status: AC | PRN

## 2023-12-09 NOTE — Telephone Encounter (Signed)
 Spoke to patient to confirm Dr. Waymond Hailey still works within [employer]. Sent in a refill of his medication.

## 2023-12-09 NOTE — Telephone Encounter (Signed)
  Copied from CRM [id number]. Topic: Clinical - Medication Question >> Dec 09, 2023 11:12 AM Isabell A wrote: Reason for CRM: Patient states he received a text from the pharmacy that Dr.[name] is no longer in the [facility]. Requesting refills for albuterol  (VENTOLIN  HFA) 108 (90 Base) MCG/ACT inhaler     [id number] - [place], [place] - [address] AT [facility] [address], [place] [address] Phone: [phone]  Fax: [phone]

## 2023-12-16 DIAGNOSIS — Z79899 Other long term (current) drug therapy: Secondary | ICD-10-CM | POA: Diagnosis not present

## 2023-12-16 DIAGNOSIS — E1129 Type 2 diabetes mellitus with other diabetic kidney complication: Secondary | ICD-10-CM | POA: Diagnosis not present

## 2023-12-16 DIAGNOSIS — K7460 Unspecified cirrhosis of liver: Secondary | ICD-10-CM | POA: Diagnosis not present

## 2023-12-17 DIAGNOSIS — N189 Chronic kidney disease, unspecified: Secondary | ICD-10-CM | POA: Diagnosis not present

## 2023-12-17 DIAGNOSIS — J449 Chronic obstructive pulmonary disease, unspecified: Secondary | ICD-10-CM | POA: Diagnosis not present

## 2023-12-17 DIAGNOSIS — E875 Hyperkalemia: Secondary | ICD-10-CM | POA: Diagnosis not present

## 2024-01-11 ENCOUNTER — Other Ambulatory Visit: Payer: Self-pay | Admitting: Gastroenterology

## 2024-01-11 DIAGNOSIS — R600 Localized edema: Secondary | ICD-10-CM

## 2024-01-12 ENCOUNTER — Other Ambulatory Visit: Payer: Self-pay | Admitting: Gastroenterology

## 2024-01-12 DIAGNOSIS — R600 Localized edema: Secondary | ICD-10-CM

## 2024-01-13 NOTE — Progress Notes (Deleted)
 [name], male    DOB: Aug 15, 1955     MRN: [id number]   Brief patient profile:  66 yowm MM/quit smoking 2012 with COPD GOLD 3 criteria 06/17/20 previously followed by Dr Vonzell referred back to pulmonary clinic 12/09/2019 in [place].   History of Present Illness  12/09/2019  Pulmonary/ 1st office eval/Terrisha Lopata  symbicort  160/spiriva   Chief Complaint  Patient presents with   Pulmonary Consult    Former patient of Dr Vonzell.  Breathing has been worse over the past few wks. Recently treated with Doxy per PCP for COPD exacerbation. He is occ coughing up some green sputum.   flare 11/23/19  rx doxy 5/19 x 7 days  Dyspnea:  Baseline uses [facility] parking / [employer] = can't walk 100 yards even at a slow pace at a flat grade s stopping due to sob  On 2lpm  Cough: improved less purulent purulent  Sleep: wakes up coughing since onset /sometimes uses saba proair   SABA use: rarely daytime rec Continue protonix  40 mg Take 30-60 min before first meal of the day  GERD diet  Plan A = Automatic = Always=    Budesonide /formoterol  (symbicort  ) 2 puffs first thing in am and 12 hours later and use spiriva  x 2 pffs after the morning symbicort  (ok to change back to the powder after 2 weeks but will need to stop it if you start coughing  Work on inhaler technique  Plan B = Backup (to supplement plan A, not to replace it) Only use your albuterol  inhaler as a rescue medication    Plan C = Crisis (instead of Plan B   Make sure you check your oxygen  saturations at highest level of activity to be sure it stays over 90%   Prednisone  10 mg take  4 each am x 2 days,   2 each am x 2 days,  1 each am x 2 days and stop  For cough > mucinex  dm 1200 mg every 12 hours as needed  Would wait another 2 weeks before you get your first shot for covid 19    [name] eval 08/01/20 rec ST eval done 08/05/20: low risk asp SLP Diet Recommendations Regular solids;Thin liquid  Liquid Administration via Straw;Cup  Medication Administration  Whole meds with liquid  Compensations Slow rate;Small sips/bites  Postural Changes Seated upright at 90 degrees;Remain semi-upright after after feeds/meals (Comment)     [name] 03/02/22 FESS   04/05/2022  f/u ov/[place] office/[name] re: GOLD 3 copd maint on symb/spiriva  = 240 dollars Chief Complaint  Patient presents with   Follow-up    Saw Dr. Karis and had surgery and is doing much better with his breathing   Dyspnea:  mailbox and back using 02 sometimes/  Cough: overall better since sinus surgery  Sleeping: noct better on bed blocks  SABA use: much less hfa/ never neb  02: 3lpm hs / sitting  and walks off it rec Plan A = Automatic = Always=    Breztri  Take 2 puffs first thing in am and then another 2 puffs about 12 hours later.   Plan B = Backup (to supplement plan A, not to replace it) Only use your albuterol  inhaler as a rescue medication  Plan C = Crisis (instead of Plan B but only if Plan B stops working) - only use your albuterol  nebulizer if you first try Plan B and it fails to help Prednisone  10 mg take  4 each am x 2 days,   2  each am x 2 days,  1 each am x 2 days and stop  For cough mucinex  dm 1200 mg every 12 hours as needed  Pantoprazole  (protonix ) 40 mg   Take  30-60 min before first meal of the day and Pepcid  (famotidine )  20 mg (OTC) after supper until return to office GERD diet reviewed, bed blocks rec  If cough not better add another gabapentin  300 mg in am   Make sure you check your oxygen  saturation  AT  your highest level of activity (not after you stop)   to be sure it stays over 90%       03/07/2023  f/u ov/[place] office/[name] re: GOLD 3 / 02 hs and prn  MPNS  maint on breztri   / not taking pepcid  any more Chief Complaint  Patient presents with   COPD    GOLD III  Dyspnea:  mb x 300 ft flat can do s stopping  Cough: worse at hs and in am p stirring but no excess / purulent sputum Sleeping: bed blocks and pillows s resp cc  SABA use: hfa x once a week/ neb  hardly any  02: 3lpm at hs , prn daytime  Lung cancer screening: due 03/2023  Rec Add pepcid  20 mg an hour before bedtime (automatically) Make sure you check your oxygen  saturation  AT  your highest level of activity (not after you stop)   to be sure it stays over 90%  Also  Ok to try albuterol  15 min before an activity (on alternating days)  that you know would usually make you short of breath      Had LDSCT in Sept 2024 ? During [facility] {  05/01/2023  f/u ov/[place] office/[name] re: COPD GOLD 3/ 02 hs and prn  maint on Breztri    Chief Complaint  Patient presents with   COPD    Gold III  Dyspnea:  mb and back sometimes s 02  Cough:resolved p abx per Dr Sheryle  Sleeping:  bed blocks/ 3 pillows s resp cc    resp cc  SABA use: 2 x this  02: 3lpm at hs  Rec For cough/ congestion > mucinex  or mucinex  dm up to maximum of  1200 mg every 12 hours as needed  We can order best fit for portable 02 thru [employer] Remember to make sure you check your oxygen  saturation  AT  your highest level of activity (not after you stop)   to be sure it stays over 90%    Admission date:  06/08/2023     Discharge Date:  06/11/2023    Discharge Diagnosis  Acute on chronic respiratory failure with hypoxia ([name]) [J96.21] Pneumonia due to infectious organism, unspecified laterality, unspecified part of lung [J18.9]     Principal Problem:   Acute on chronic respiratory failure with hypoxia ([name])   Gastroesophageal reflux disease   HTN (hypertension)   Type 2 diabetes mellitus without complication, without long-term current use of insulin  ([name])   COPD with acute exacerbation ([name])   CAP (community acquired pneumonia)   AKI (acute kidney injury) ([name])     Never got back to baseline since d/c 06/11/23 in terms of sob/cough   07/26/2023  f/u ov/[place] office/[name] re: GOLD 3 / 02 HS  maint on breztri    Chief Complaint  Patient presents with   Follow-up  Worse since finished prednisone   x around a week prior  to OV  more congested / green mucus  ? Best rx  was levaquin  per wife  Dyspnea:  room to room since Jul 12 2023 mostly tired /some presyncope as well standing / eating and drinking ok  Cough:worse at hs and off and on all night  Sleeping: bed is flat with bunch of pillows    SABA use: last used neb> 12 h prior to OV  and no hfa on day of ov either 02: 3lpm 24/7  sometimes 4lpm but not really titrating to sats  Rec Levaquin  500 mg daily x 10 days  - stop if have any pain ankle tendons  Prednisone  10 mg x 2 each am until better then  1 daily x 5 days and stop  Stop losartan  for now  We may need to stop your  ACTOS in future as it may be contributing with your breathing problems  Please schedule a follow up office visit in  2 weeks, sooner if needed Late add:   next step may be  collect mucus for opportunistic gnr and atypical TB - ask about flutter use next ov also Bring all meds next ov / consider addison's from freq steroid exp       10/10/2023 hosp f/u  f/u ov/[place] office/[name] re: GOLD 3 copd.02 dep  maint on Breztri    Cc cough   Dyspnea:  walking back in the house on 3lpm but not checking  Cough: changed to dark brown x one week assoc with nasal congestion Sleeping: 30 degrees HOB / 3 pillows some cough at hs then settles down  and flares in am at usual hour  SABA use:  just hfa * 02: 3lpm 24/7  Has f/u with [name]/ not using nettipot  Rec Augmentin  875 mg take one pill twice daily  X 10 days   For cough/ congestion > mucinex  or mucinex  dm  up to maximum of  1200 mg every 12 hours and use the flutter valve as much as you can   Keep appt with Dr Karis   Make sure you check your oxygen  saturation  AT  your highest level of activity (not after you stop)   to be sure it stays over 90%      01/14/2024  f/u ov/[name] re: GOLD 3 copd/ 02 dep    maint on ***  No chief complaint on file.   Dyspnea:  *** Cough: *** Sleeping: *** resp cc  SABA use: *** 02: ***  Lung cancer screening :   10/14/23  RADS 2/ emphysema    No obvious day to day or daytime variability or assoc excess/ purulent sputum or mucus plugs or hemoptysis or cp or chest tightness, subjective wheeze or overt sinus or hb symptoms.    Also denies any obvious fluctuation of symptoms with weather or environmental changes or other aggravating or alleviating factors except as outlined above   No unusual exposure hx or h/o childhood pna/ asthma or knowledge of premature birth.  Current Allergies, Complete Past Medical History, Past Surgical History, Family History, and Social History were reviewed in [employer] record.  ROS  The following are not active complaints unless bolded Hoarseness, sore throat, dysphagia, dental problems, itching, sneezing,  nasal congestion or discharge of excess mucus or purulent secretions, ear ache,   fever, chills, sweats, unintended wt loss or wt gain, classically pleuritic or exertional cp,  orthopnea pnd or arm/hand swelling  or leg swelling, presyncope, palpitations, abdominal pain, anorexia, nausea, vomiting, diarrhea  or change in bowel habits or change in bladder habits, change  in stools or change in urine, dysuria, hematuria,  rash, arthralgias, visual complaints, headache, numbness, weakness or ataxia or problems with walking or coordination,  change in mood or  memory.        No outpatient medications have been marked as taking for the 01/14/24 encounter (Appointment) with Rupal Childress B, MD.            Past Medical History:  Diagnosis Date   Anxiety    Asthma    Chest pain    a. 2003 Cath: nl cors.   COPD (chronic obstructive pulmonary disease) ([name])    Depression    Diabetes mellitus    Dyslipidemia    Dysrhythmia    Fracture 12/15   lower back L-1   GERD (gastroesophageal reflux disease)    Hypertension    PONV (postoperative nausea and vomiting)    Splenomegaly        Objective:    Wt   01/14/2024           ***  10/10/2023           168 08/08/2023        167  07/26/2023        162  05/01/2023      170   03/07/2023        167   09/06/2022        163   04/05/2022        156 12/08/2021          164 09/01/2021        167  06/06/2021      162 12/08/2020          163 09/15/2020        154  05/23/2020      166 04/28/2020      166   12/30/19 162 lb (73.5 kg)  12/21/19 162 lb (73.5 kg)  12/09/19 162 lb (73.5 kg)   Vital signs reviewed  01/14/2024  - Note at rest 02 sats  ***% on ***   General appearance:    ***    Mod barr***                    Assessment

## 2024-01-14 ENCOUNTER — Ambulatory Visit: Admitting: Internal Medicine

## 2024-01-26 NOTE — Progress Notes (Unsigned)
 [name], male    DOB: 10-13-1955     MRN: [id number]   Brief patient profile:  60 yowm MM/quit smoking 2012 with COPD GOLD 3 criteria 06/17/20 previously followed by Dr Vonzell referred back to pulmonary clinic 12/09/2019 in [place].   History of Present Illness  12/09/2019  Pulmonary/ 1st office eval/[name]  symbicort  160/spiriva   Chief Complaint  Patient presents with   Pulmonary Consult    Former patient of Dr Vonzell.  Breathing has been worse over the past few wks. Recently treated with Doxy per PCP for COPD exacerbation. He is occ coughing up some green sputum.   flare 11/23/19  rx doxy 5/19 x 7 days  Dyspnea:  Baseline uses [facility] parking / [employer] = can't walk 100 yards even at a slow pace at a flat grade s stopping due to sob  On 2lpm  Cough: improved less purulent purulent  Sleep: wakes up coughing since onset /sometimes uses saba proair   SABA use: rarely daytime rec Continue protonix  40 mg Take 30-60 min before first meal of the day  GERD diet  Plan A = Automatic = Always=    Budesonide /formoterol  (symbicort  ) 2 puffs first thing in am and 12 hours later and use spiriva  x 2 pffs after the morning symbicort  (ok to change back to the powder after 2 weeks but will need to stop it if you start coughing  Work on inhaler technique  Plan [name] = Backup (to supplement plan A, not to replace it) Only use your albuterol  inhaler as a rescue medication    Plan C = Crisis (instead of Plan [name]   Make sure you check your oxygen  saturations at highest level of activity to be sure it stays over 90%   Prednisone  10 mg take  4 each am x 2 days,   2 each am x 2 days,  1 each am x 2 days and stop  For cough > mucinex  dm 1200 mg every 12 hours as needed  Would wait another 2 weeks before you get your first shot for covid 19    [name] eval 08/01/20 rec ST eval done 08/05/20: low risk asp SLP Diet Recommendations Regular solids;Thin liquid  Liquid Administration via Straw;Cup  Medication Administration  Whole meds with liquid  Compensations Slow rate;Small sips/bites  Postural Changes Seated upright at 90 degrees;Remain semi-upright after after feeds/meals (Comment)     [name] 03/02/22 FESS   04/05/2022  f/u ov/[place] office/[name] re: GOLD 3 copd maint on symb/spiriva  = 240 dollars Chief Complaint  Patient presents with   Follow-up    Saw Dr. Karis and had surgery and is doing much better with his breathing   Dyspnea:  mailbox and back using 02 sometimes/  Cough: overall better since sinus surgery  Sleeping: noct better on bed blocks  SABA use: much less hfa/ never neb  02: 3lpm hs / sitting  and walks off it rec Plan A = Automatic = Always=    Breztri  Take 2 puffs first thing in am and then another 2 puffs about 12 hours later.   Plan [name] = Backup (to supplement plan A, not to replace it) Only use your albuterol  inhaler as a rescue medication  Plan C = Crisis (instead of Plan [name] but only if Plan [name] stops working) - only use your albuterol  nebulizer if you first try Plan [name] and it fails to help Prednisone  10 mg take  4 each am x 2 days,   2  each am x 2 days,  1 each am x 2 days and stop  For cough mucinex  dm 1200 mg every 12 hours as needed  Pantoprazole  (protonix ) 40 mg   Take  30-60 min before first meal of the day and Pepcid  (famotidine )  20 mg (OTC) after supper until return to office GERD diet reviewed, bed blocks rec  If cough not better add another gabapentin  300 mg in am   Make sure you check your oxygen  saturation  AT  your highest level of activity (not after you stop)   to be sure it stays over 90%       03/07/2023  f/u ov/[place] office/[name] re: GOLD 3 / 02 hs and prn  MPNS  maint on breztri   / not taking pepcid  any more Chief Complaint  Patient presents with   COPD    GOLD III  Dyspnea:  mb x 300 ft flat can do s stopping  Cough: worse at hs and in am p stirring but no excess / purulent sputum Sleeping: bed blocks and pillows s resp cc  SABA use: hfa x once a week/ neb  hardly any  02: 3lpm at hs , prn daytime  Lung cancer screening: due 03/2023  Rec Add pepcid  20 mg an hour before bedtime (automatically) Make sure you check your oxygen  saturation  AT  your highest level of activity (not after you stop)   to be sure it stays over 90%  Also  Ok to try albuterol  15 min before an activity (on alternating days)  that you know would usually make you short of breath      Had LDSCT in Sept 2024 ? During [facility] {  05/01/2023  f/u ov/[place] office/[name] re: COPD GOLD 3/ 02 hs and prn  maint on Breztri    Chief Complaint  Patient presents with   COPD    Gold III  Dyspnea:  mb and back sometimes s 02  Cough:resolved p abx per Dr Sheryle  Sleeping:  bed blocks/ 3 pillows s resp cc    resp cc  SABA use: 2 x this  02: 3lpm at hs  Rec For cough/ congestion > mucinex  or mucinex  dm up to maximum of  1200 mg every 12 hours as needed  We can order best fit for portable 02 thru [employer] Remember to make sure you check your oxygen  saturation  AT  your highest level of activity (not after you stop)   to be sure it stays over 90%    Admission date:  06/08/2023     Discharge Date:  06/11/2023    Discharge Diagnosis  Acute on chronic respiratory failure with hypoxia ([name]) [J96.21] Pneumonia due to infectious organism, unspecified laterality, unspecified part of lung [J18.9]     Principal Problem:   Acute on chronic respiratory failure with hypoxia ([name])   Gastroesophageal reflux disease   HTN (hypertension)   Type 2 diabetes mellitus without complication, without long-term current use of insulin  ([name])   COPD with acute exacerbation ([name])   CAP (community acquired pneumonia)   AKI (acute kidney injury) ([name])     Never got back to baseline since d/c 06/11/23 in terms of sob/cough   07/26/2023  f/u ov/[place] office/[name] re: GOLD 3 / 02 HS  maint on breztri    Chief Complaint  Patient presents with   Follow-up  Worse since finished prednisone   x around a week prior  to OV  more congested / green mucus  ? Best rx  was levaquin  per wife  Dyspnea:  room to room since Jul 12 2023 mostly tired /some presyncope as well standing / eating and drinking ok  Cough:worse at hs and off and on all night  Sleeping: bed is flat with bunch of pillows    SABA use: last used neb> 12 h prior to OV  and no hfa on day of ov either 02: 3lpm 24/7  sometimes 4lpm but not really titrating to sats  Rec Levaquin  500 mg daily x 10 days  - stop if have any pain ankle tendons  Prednisone  10 mg x 2 each am until better then  1 daily x 5 days and stop  Stop losartan  for now  We may need to stop your  ACTOS in future as it may be contributing with your breathing problems  Please schedule a follow up office visit in  2 weeks, sooner if needed Late add:   next step may be  collect mucus for opportunistic gnr and atypical TB - ask about flutter use next ov also Bring all meds next ov / consider addison's from freq steroid exp       10/10/2023 hosp f/u  f/u ov/[place] office/[name] re: GOLD 3 copd.02 dep  maint on Breztri    Cc cough   Dyspnea:  walking back in the house on 3lpm but not checking  Cough: changed to dark brown x one week assoc with nasal congestion Sleeping: 30 degrees HOB / 3 pillows some cough at hs then settles down  and flares in am at usual hour  SABA use:  just hfa * 02: 3lpm 24/7  Has f/u with [name]/ not using nettipot  Rec Augmentin  875 mg take one pill twice daily  X 10 days   For cough/ congestion > mucinex  or mucinex  dm  up to maximum of  1200 mg every 12 hours and use the flutter valve as much as you can   Keep appt with Dr Karis   Make sure you check your oxygen  saturation  AT  your highest level of activity (not after you stop)   to be sure it stays over 90%      01/28/2024  f/u ov/[name] re: GOLD 3 copd/ 02 dep    maint on ***  No chief complaint on file.   Dyspnea:  *** Cough: *** Sleeping: *** resp cc  SABA use: *** 02: ***  Lung cancer screening  :  10/14/23  RADS 2/ emphysema    No obvious day to day or daytime variability or assoc excess/ purulent sputum or mucus plugs or hemoptysis or cp or chest tightness, subjective wheeze or overt sinus or hb symptoms.    Also denies any obvious fluctuation of symptoms with weather or environmental changes or other aggravating or alleviating factors except as outlined above   No unusual exposure hx or h/o childhood pna/ asthma or knowledge of premature birth.  Current Allergies, Complete Past Medical History, Past Surgical History, Family History, and Social History were reviewed in [employer] record.  ROS  The following are not active complaints unless bolded Hoarseness, sore throat, dysphagia, dental problems, itching, sneezing,  nasal congestion or discharge of excess mucus or purulent secretions, ear ache,   fever, chills, sweats, unintended wt loss or wt gain, classically pleuritic or exertional cp,  orthopnea pnd or arm/hand swelling  or leg swelling, presyncope, palpitations, abdominal pain, anorexia, nausea, vomiting, diarrhea  or change in bowel habits or change in bladder habits, change  in stools or change in urine, dysuria, hematuria,  rash, arthralgias, visual complaints, headache, numbness, weakness or ataxia or problems with walking or coordination,  change in mood or  memory.        No outpatient medications have been marked as taking for the 01/28/24 encounter (Appointment) with Christopher Toulouse B, MD.            Past Medical History:  Diagnosis Date   Anxiety    Asthma    Chest pain    a. 2003 Cath: nl cors.   COPD (chronic obstructive pulmonary disease) ([name])    Depression    Diabetes mellitus    Dyslipidemia    Dysrhythmia    Fracture 12/15   lower back L-1   GERD (gastroesophageal reflux disease)    Hypertension    PONV (postoperative nausea and vomiting)    Splenomegaly        Objective:    Wt   01/28/2024           ***  10/10/2023           168 08/08/2023        167  07/26/2023        162  05/01/2023      170   03/07/2023        167   09/06/2022        163   04/05/2022        156 12/08/2021          164 09/01/2021        167  06/06/2021      162 12/08/2020          163 09/15/2020        154  05/23/2020      166 04/28/2020      166   12/30/19 162 lb (73.5 kg)  12/21/19 162 lb (73.5 kg)  12/09/19 162 lb (73.5 kg)   Vital signs reviewed  01/28/2024  - Note at rest 02 sats  ***% on ***   General appearance:    ***    Mod barr***                    Assessment

## 2024-01-28 ENCOUNTER — Ambulatory Visit: Admitting: Internal Medicine

## 2024-01-28 ENCOUNTER — Encounter: Payer: Self-pay | Admitting: Internal Medicine

## 2024-01-28 VITALS — BP 98/56 | HR 115 | Ht 67.0 in | Wt 166.0 lb

## 2024-01-28 DIAGNOSIS — E119 Type 2 diabetes mellitus without complications: Secondary | ICD-10-CM

## 2024-01-28 DIAGNOSIS — Z87891 Personal history of nicotine dependence: Secondary | ICD-10-CM | POA: Diagnosis not present

## 2024-01-28 DIAGNOSIS — J449 Chronic obstructive pulmonary disease, unspecified: Secondary | ICD-10-CM

## 2024-01-28 DIAGNOSIS — J9611 Chronic respiratory failure with hypoxia: Secondary | ICD-10-CM

## 2024-01-28 MED ORDER — PREDNISONE 10 MG PO TABS
ORAL_TABLET | ORAL | 0 refills | Status: DC
Start: 2024-01-28 — End: 2024-03-16

## 2024-01-28 NOTE — Patient Instructions (Addendum)
 I would strongly advise a trial off of Actos due to tendency to fluid retention   Work on inhaler technique:  relax and gently blow all the way out then take a nice smooth full deep breath back in, triggering the inhaler at same time you start breathing in.  Hold breath in for at least  5 seconds if you can. Blow out breztri   thru nose. Rinse and gargle with water  when done.  If mouth or throat bother you at all,  try brushing teeth/gums/tongue with arm and hammer toothpaste/ make a slurry and gargle and spit out.       Plan A = Automatic = Always=    Breztri  Take 2 puffs first thing in am and then another 2 puffs about 12 hours later.  Add  ohtuvayre  nebulizer first thing in am and again in evening   Plan B = Backup (to supplement plan A, not to replace it) Use your albuterol  inhaler as a rescue medication to be used if you can't catch your breath by resting or slowing your pace  or doing a relaxed purse lip breathing pattern.  - The less you use it, the better it will work when you need it. - Ok to use the inhaler up to 2 puffs  every 4 hours if you must but call for appointment if use goes up over your usual need - Don't leave home without it !!  (think of it like the spare tire or starter fluid for your car)   Plan C = Crisis (instead of Plan B but only if Plan B stops working) - only use your albuterol  nebulizer if you first try Plan B and it fails to help > ok to use the nebulizer up to every 4 hours but if start needing it regularly call for immediate appointment   Plan D = Deltasone   if ABC not working  - prednisone   10 mg x 2 until better then 1 daily x 5 days and stop   Make sure you check your oxygen  saturation  AT  your highest level of activity (not after you stop)   to be sure it stays over 90% and adjust  02 flow upward to maintain this level if needed but remember to turn it back to previous settings when you stop (to conserve your supply).   Referring you to pulmonary rehab  at [facility]    Please schedule a follow up office visit in 6 weeks, call sooner if needed

## 2024-01-29 NOTE — Assessment & Plan Note (Signed)
 Quit smoking 2012/MM - alpha one AT screen  01/31/17  MM  Level 169 - PFT's  06/18/2019  FEV1 1.12 (35 % ) ratio 0.46  p 0 % improvement from saba p ? prior to study with DLCO  11.91 (48%) corrects to 3.18 (754%)  for alv volume and FV curve classic concavity   - [name] 03/02/22 FESS  - 03/07/2023  continue breztri  - 05/01/2023    continue breztri  and approp saba  -01/28/2024  After extensive coaching inhaler device,  effectiveness =    75% (Ti short) for hfa - 01/28/2024 Ohtuvayre  ordered/ rec Pulmonary rehab due to refractory doe/pred dependency   Group D (now reclassified as E) in terms of symptom/risk and laba/lama/ICS  therefore appropriate rx at this point >>>  breztri  but overusing neb saba and prednisone  with very poor insight into overall management  and progressive decline in level of function> try pulmonary rehab

## 2024-01-29 NOTE — Assessment & Plan Note (Signed)
 Would strongly urge d/c ACTOS at this point given his tendency to fluid retention but this needs to be done in concert with his other DM meds  Discussed in detail all the  indications, usual  risks and alternatives  relative to the benefits with patient who agrees to proceed with Rx as outlined.             Each maintenance medication was reviewed in detail including emphasizing most importantly the difference between maintenance and prns and under what circumstances the prns are to be triggered using an action plan format where appropriate.  Total time for H and P, chart review, counseling, reviewing hfa/neb/02/pulse ox  device(s) and generating customized AVS unique to this office visit / same day charting = 37 min   for multiple  refractory respiratory  symptoms

## 2024-01-29 NOTE — Assessment & Plan Note (Signed)
 Started on 02 around 2019 As of 02/03/2020  2lpm 24/7  -  02/03/2020 referred to [employer] for Best fit for amb 02 > 3lpm pulsed tank -  04/28/2020   Walked 3lpm pulsed tank  approx   400 ft  @ moderate pace  stopped due to  Sob/tired  with sats still 95%    -  09/15/2020   Walked 3lpm cont approx   200 ft  @ slow/slt undsteady  pace  stopped due to  Sob/fatigue   With sats 91% at rest sats were 86% RA   12/08/2020   Walked RA  approx   500 ft  @ moderate pace  stopped due to  End of study, a little sob, sats still 90%  - 04/05/2022 patient walked at a moderate pace on room air all three laps @ 150 ft each. Desat to 87% at end of third lap. Patient placed back on 3LO2 cont at end of walk  - 09/06/2022   Walked on 2lpm cont   x  3  lap(s) =  approx 450  ft  @ moderate pace, stopped due to end of study  with lowest 02 sats 93%   - 03/07/2023   Walked on 3lpm   x  2  lap(s) =  approx 300  ft  @ nl  pace, stopped due to sob with lowest 02 sats 88%   -  05/01/2023   Walked on 2lpom con  x  3  lap(s) =  approx 450  ft  @ mod to slow pace, stopped due to sob  with lowest 02 sats 91%      Continues to fail to adjust 02 to adequate flows to provide sats > 90%/ repeated instructions with wife present.

## 2024-01-30 DIAGNOSIS — E1129 Type 2 diabetes mellitus with other diabetic kidney complication: Secondary | ICD-10-CM | POA: Diagnosis not present

## 2024-01-30 DIAGNOSIS — Z79899 Other long term (current) drug therapy: Secondary | ICD-10-CM | POA: Diagnosis not present

## 2024-01-30 DIAGNOSIS — R945 Abnormal results of liver function studies: Secondary | ICD-10-CM | POA: Diagnosis not present

## 2024-01-30 DIAGNOSIS — K7460 Unspecified cirrhosis of liver: Secondary | ICD-10-CM | POA: Diagnosis not present

## 2024-01-30 DIAGNOSIS — R5382 Chronic fatigue, unspecified: Secondary | ICD-10-CM | POA: Diagnosis not present

## 2024-01-30 DIAGNOSIS — J449 Chronic obstructive pulmonary disease, unspecified: Secondary | ICD-10-CM | POA: Diagnosis not present

## 2024-01-31 ENCOUNTER — Telehealth: Payer: Self-pay

## 2024-01-31 NOTE — Telephone Encounter (Signed)
 Received Ohtuvayre new start paperwork. Completed form and faxed with clinicals and insurance card copy to [facility]   Phone#: [phone] Fax#: [phone]

## 2024-02-03 ENCOUNTER — Telehealth: Payer: Self-pay | Admitting: Internal Medicine

## 2024-02-03 DIAGNOSIS — L821 Other seborrheic keratosis: Secondary | ICD-10-CM | POA: Diagnosis not present

## 2024-02-03 DIAGNOSIS — L570 Actinic keratosis: Secondary | ICD-10-CM | POA: Diagnosis not present

## 2024-02-03 DIAGNOSIS — D1801 Hemangioma of skin and subcutaneous tissue: Secondary | ICD-10-CM | POA: Diagnosis not present

## 2024-02-03 DIAGNOSIS — Q825 Congenital non-neoplastic nevus: Secondary | ICD-10-CM | POA: Diagnosis not present

## 2024-02-03 NOTE — Telephone Encounter (Signed)
 Copied from CRM #[id number]. Topic: Clinical - Order For Equipment >> Jan 28, 2024  2:55 PM Celestine FALCON wrote: Reason for CRM: Marcey with [employer] stated he faxed, and will be faxing again, a form needed to be signed by the pt's provider Dr. Darlean to replace equipment with [employer]. Fax number [phone].  Signed document faxed to [phone]

## 2024-02-04 DIAGNOSIS — S81801A Unspecified open wound, right lower leg, initial encounter: Secondary | ICD-10-CM | POA: Diagnosis not present

## 2024-02-06 DIAGNOSIS — E1129 Type 2 diabetes mellitus with other diabetic kidney complication: Secondary | ICD-10-CM | POA: Diagnosis not present

## 2024-02-06 DIAGNOSIS — R601 Generalized edema: Secondary | ICD-10-CM | POA: Diagnosis not present

## 2024-02-06 DIAGNOSIS — J449 Chronic obstructive pulmonary disease, unspecified: Secondary | ICD-10-CM | POA: Diagnosis not present

## 2024-02-11 NOTE — Telephone Encounter (Signed)
 Submitted a Prior Authorization request to [facility] [employer] for OHTUVAYRE  via CoverMyMeds. Will update once we receive a response.  Key: [employer]

## 2024-02-13 NOTE — Progress Notes (Unsigned)
 Referring Provider: Sheryle Carwin, MD Primary Care Physician:  Sheryle Carwin, MD Primary GI Physician: Dr. Shaaron  No chief complaint on file.   HPI:   [name] is a 68 y.o. male presenting today with history significant for chronic respiratory failure on supplemental oxygen  (2-3L), type 2 diabetes, HTN, GERD, adenomatous colon polyps, hemorrhoids with intermittent rectal bleeding, biopsy-proven NASH fibrosis in 2018, now being followed for cirrhosis,  also following with hematology for thrombocytopenia.  He is presenting today for follow-up of cirrhosis.    Diuretics including Lasix  20 mg and spironolactone  25 mg were started in November 2024 due to LE edema.    Lasix  increased to 40 mg daily and spironolactone  to 50 mg daily due to 2+ pedal edema and 1+ edema up to knees on 10/17/23.    Last seen in the office 11/15/2022.  Reported increased fatigue and leg cramping for the last 2 weeks.  Reported taking all of his medications as prescribed and had not changed anything such as diet, etc.  He did note that blood sugars had been much higher than normal ranging in the upper 200s and recent A1c was 9.1.  He had upcoming follow-up with PCP.  Reported LE edema improved, primarily in his feet.  No other concerns.  He had just had blood work on 5/6 that showed creatinine and electrolytes.  Plan to update routine other cirrhosis labs, magnesium, B12, ultrasound for [name] screening, continue current medications, complete hep A/B vaccine series, 89-month follow-up.  Labs completed showing normal CBC mild bump in alk phos 139 and ALT to 78, magnesium 1.7, INR 1.  B12 elevated at 1821.   Today:       Paracentesis: Never History of SBP: No Encephalopathy:  None.    MELD 3.0: 6 on 11/19/23. US : 11/25/2023-no focal liver lesion. AFP: Normal in November 2024. Hep A/B vaccination:  Completed vaccination in 2019 but appears he only received 1 dose. No immunity on labs in 2022. *** EGD: 05/22/2023-no  varices, portal hypertensive gastropathy.  Repeat EGD in 2 years. BB: No  Past Medical History:  Diagnosis Date   Anxiety    Arthritis    Asthma    Chest pain    a. 2003 Cath: nl cors.   Cirrhosis ([name])    secondary to MASH   Concussion    COPD (chronic obstructive pulmonary disease) ([name])    chronically on 3-4L [place]   Depression    Diabetes mellitus    Dyslipidemia    Tachycardia   Dyspnea    Dysrhythmia    Fracture 06/2014   lower back L-1   GERD (gastroesophageal reflux disease)    Hypertension    NASH (nonalcoholic steatohepatitis) 2018   [name], undergoing routine cirrhosis care; [name]   Pneumonia 12/01/2021   PONV (postoperative nausea and vomiting)    Splenomegaly    Thrombocytopenia ([name])     Past Surgical History:  Procedure Laterality Date   BIOPSY  07/26/2016   Procedure: BIOPSY;  Surgeon: Lamar CHRISTELLA Shaaron, MD;  [facility];  Service: Endoscopy;;  gastric   CARDIAC CATHETERIZATION  10/22/2001   normal coronary arteriea (Dr. MICAEL Ona)   COLONOSCOPY  08/31/2011   MFM:Floupeoz rectal and colon polyps-treated. Single tubular adenoma. Next TCS 08/2016   COLONOSCOPY N/A 07/26/2016   Dr. Shaaron: diverticulosis in sigmoid and descending colon. 6 mm benign splenic flexure. surveillance 5 yeras   COLONOSCOPY WITH PROPOFOL  N/A  08/07/2021   Surgeon: Cindie Dunnings K, DO;  Nonbleeding internal hemorrhoids, three 3-6 mm polyps in the transverse colon resected and retrieved, otherwise normal exam.  Pathology with 1 tubular adenoma, otherwise benign colonic mucosa.  Recommended 5-year repeat.   ESOPHAGOGASTRODUODENOSCOPY N/A 07/26/2016   Dr. Shaaron: normal esophagus, portal gastropathy, chronic gastritis, normal duodenum, screening EGD 2 years    ESOPHAGOGASTRODUODENOSCOPY (EGD) WITH ESOPHAGEAL DILATION N/A 05/15/2013   MFM:Wnmfjo esophagus-status post Agapito dilation/Portal gastropathy. Antral erosions-status post  biopsy. extrinsic compression along lesser curvature likely secondary to splenomegaly. gastric bx benign   ESOPHAGOGASTRODUODENOSCOPY (EGD) WITH PROPOFOL  N/A 12/12/2020   Surgeon: Shaaron Lamar HERO, MD; ormal esophagus s/p dilation, portal hypertensive gastropathy.  Due for repeat in 2024.   ESOPHAGOGASTRODUODENOSCOPY (EGD) WITH PROPOFOL  N/A 05/22/2023   Procedure: ESOPHAGOGASTRODUODENOSCOPY (EGD) WITH PROPOFOL ;  Surgeon: Shaaron Lamar HERO, MD;  [facility];  Service: Endoscopy;  Laterality: N/A;  9:30 am, asa 3   ETHMOIDECTOMY Bilateral 03/02/2022   Procedure: ETHMOIDECTOMY;  Surgeon: Karis Clunes, MD;  Location: [facility] OR;  Service: ENT;  Laterality: Bilateral;   HERNIA REPAIR  03/2009   MALONEY DILATION N/A 12/12/2020   Procedure: AGAPITO DILATION;  Surgeon: Shaaron Lamar HERO, MD;  [facility];  Service: Endoscopy;  Laterality: N/A;   MAXILLARY ANTROSTOMY Bilateral 03/02/2022   Procedure: MAXILLARY ANTROSTOMY;  Surgeon: Karis Clunes, MD;  Location: [facility] OR;  Service: ENT;  Laterality: Bilateral;   NASAL SEPTOPLASTY W/ TURBINOPLASTY Bilateral 03/02/2022   Procedure: NASAL SEPTOPLASTY WITH TURBINATE REDUCTION;  Surgeon: Karis Clunes, MD;  Location: [facility] OR;  Service: ENT;  Laterality: Bilateral;   POLYPECTOMY  07/26/2016   Procedure: POLYPECTOMY;  Surgeon: Lamar HERO Shaaron, MD;  [facility];  Service: Endoscopy;;  colon   POLYPECTOMY  08/07/2021   Procedure: POLYPECTOMY;  Surgeon: Cindie Dunnings POUR, DO;  [facility];  Service: Endoscopy;;   SINUS ENDO WITH FUSION N/A 03/02/2022   Procedure: SINUS ENDO WITH FUSION;  Surgeon: Karis Clunes, MD;  Location: [facility] OR;  Service: ENT;  Laterality: N/A;   TRANSTHORACIC ECHOCARDIOGRAM  2011   EF 50-55%, stage 1 diastolic dysfunction, trace TR & pulm valve regurg    VASECTOMY  1989    Current Outpatient Medications  Medication Sig Dispense Refill   acetaminophen  (TYLENOL ) 325 MG tablet Take 2 tablets (650 mg total) by mouth every 6 (six)  hours as needed for mild pain (pain score 1-3) (or Fever >/= 101).     albuterol  (PROVENTIL ) (2.5 MG/3ML) 0.083% nebulizer solution Take 3 mLs (2.5 mg total) by nebulization every 2 (two) hours as needed for wheezing or shortness of breath (cough). 75 mL 2   albuterol  (VENTOLIN  HFA) 108 (90 Base) MCG/ACT inhaler Inhale 2 puffs into the lungs every 4 (four) hours as needed for wheezing or shortness of breath. 8 g 2   ALPRAZolam  (XANAX ) 0.25 MG tablet Take 0.25 mg by mouth 2 (two) times daily as needed for sleep or anxiety.     atorvastatin  (LIPITOR) 40 MG tablet Take 40 mg by mouth in the morning.     BREZTRI  AEROSPHERE 160-9-4.8 MCG/ACT AERO INHALE 2 PUFFS INTO THE LUNGS TWICE A DAY 10.7 each 11   Cholecalciferol  (VITAMIN D3) 25 MCG (1000 UT) CAPS Take 1,000 Units by mouth in the morning.     diltiazem  (CARDIZEM  CD) 240 MG 24 hr capsule Take 240 mg by mouth daily.     famotidine  (PEPCID ) 20 MG tablet TAKE 1 TABLET BY MOUTH EVERY  DAY AFTER SUPPER 90 tablet 0   ferrous sulfate  325 (65 FE) MG tablet Take 325 mg by mouth in the morning.     fluticasone  (FLONASE ) 50 MCG/ACT nasal spray Place 1 spray into both nostrils daily as needed for allergies or rhinitis.     furosemide  (LASIX ) 40 MG tablet TAKE 1 TABLET BY MOUTH EVERY DAY 90 tablet 1   gabapentin  (NEURONTIN ) 300 MG capsule Take 1 capsule (300 mg total) by mouth at bedtime. 30 capsule 5   glimepiride  (AMARYL ) 1 MG tablet Take 1 mg by mouth daily with breakfast.     insulin  glargine, 2 Unit Dial, (TOUJEO  MAX) 300 UNIT/ML Solostar Pen Inject into the skin.     metFORMIN  (GLUCOPHAGE ) 1000 MG tablet Take 1,000 mg by mouth daily with supper.     Multiple Vitamin (MULTIVITAMIN WITH MINERALS) TABS tablet Take 1 tablet by mouth at bedtime. 30 tablet 2   OXYGEN  Inhale 3 L into the lungs continuous.     pantoprazole  (PROTONIX ) 40 MG tablet TAKE 1 TABLET (40 MG TOTAL) BY MOUTH IN THE MORNING 90 tablet 1   pioglitazone (ACTOS) 15 MG tablet Take 15 mg by mouth  daily.     predniSONE  (DELTASONE ) 10 MG tablet Take 2 each am until breathing better then 1 daily x 5 days and stop 100 tablet 0   Probiotic Product (PROBIOTIC DAILY PO) Take by mouth.     pyridOXINE (B-6) 50 MG tablet Take 50 mg by mouth daily.     sertraline  (ZOLOFT ) 50 MG tablet Take 1 tablet (50 mg total) by mouth daily. 30 tablet 3   tamsulosin  (FLOMAX ) 0.4 MG CAPS capsule Take 0.4 mg by mouth in the morning.     vitamin C  (ASCORBIC ACID ) 250 MG tablet Take 250 mg by mouth in the morning.     Wheat Dextrin (BENEFIBER PO) Take 1 Capful by mouth daily.     No current facility-administered medications for this visit.    Allergies as of 02/14/2024 - Review Complete 01/28/2024  Allergen Reaction Noted   Lioresal [baclofen] Other (See Comments) 07/24/2016   Zocor [simvastatin] Nausea Only 10/05/2013    Family History  Problem Relation Age of Onset   Heart disease Mother    Cancer Mother        lymph nodes   Diabetes Mother    Arthritis Other    Lung disease Other    Asthma Other    Kidney disease Other    Ovarian cancer Sister    Diabetes Brother    Heart disease Brother    Hypertension Brother    Diabetes Sister    Diabetes Sister    Cirrhosis Sister 49       no etoh   Colon cancer Neg Hx     Social History   Socioeconomic History   Marital status: Married    Spouse name: Nena   Number of children: 1   Years of education: Not on file   Highest education level: Not on file  Occupational History   Not on file  Tobacco Use   Smoking status: Former    Current packs/day: 0.00    Average packs/day: 1.3 packs/day for 36.0 years (45.0 ttl pk-yrs)    Types: Cigarettes    Start date: 04/1975    Quit date: 04/2011    Years since quitting: 12.8   Smokeless tobacco: Never  Vaping Use   Vaping status: Never Used  Substance and Sexual Activity   Alcohol use:  No   Drug use: No   Sexual activity: Not Currently  Other Topics Concern   Not on file  Social History  Narrative   Not on file   Social Drivers of Health   Financial Resource Strain: Low Risk  (08/16/2020)   Overall Financial Resource Strain (CARDIA)    Difficulty of Paying Living Expenses: Not hard at all  Food Insecurity: No Food Insecurity (08/14/2023)   Hunger Vital Sign    Worried About Running Out of Food in the Last Year: Never true    Ran Out of Food in the Last Year: Never true  Transportation Needs: No Transportation Needs (08/14/2023)   PRAPARE - Administrator, Civil Service (Medical): No    Lack of Transportation (Non-Medical): No  Physical Activity: Sufficiently Active (08/16/2020)   Exercise Vital Sign    Days of Exercise per Week: 7 days    Minutes of Exercise per Session: 30 min  Stress: No Stress Concern Present (08/16/2020)   [employer] of Occupational Health - Occupational Stress Questionnaire    Feeling of Stress : Not at all  Social Connections: Moderately Integrated (08/14/2023)   Social Connection and Isolation Panel    Frequency of Communication with Friends and Family: Three times a week    Frequency of Social Gatherings with Friends and Family: Three times a week    Attends Religious Services: More than 4 times per year    Active Member of Clubs or Organizations: No    Attends Banker Meetings: Never    Marital Status: Married    Review of Systems: Gen: Denies fever, chills, anorexia. Denies fatigue, weakness, weight loss.  CV: Denies chest pain, palpitations, syncope, peripheral edema, and claudication. Resp: Denies dyspnea at rest, cough, wheezing, coughing up blood, and pleurisy. GI: Denies vomiting blood, jaundice, and fecal incontinence.   Denies dysphagia or odynophagia. Derm: Denies rash, itching, dry skin Psych: Denies depression, anxiety, memory loss, confusion. No homicidal or suicidal ideation.  Heme: Denies bruising, bleeding, and enlarged lymph nodes.  Physical Exam: There were no vitals taken for this  visit. General:   Alert and oriented. No distress noted. Pleasant and cooperative.  Head:  Normocephalic and atraumatic. Eyes:  Conjuctiva clear without scleral icterus. Heart:  S1, S2 present without murmurs appreciated. Lungs:  Clear to auscultation bilaterally. No wheezes, rales, or rhonchi. No distress.  Abdomen:  +BS, soft, non-tender and non-distended. No rebound or guarding. No HSM or masses noted. Msk:  Symmetrical without gross deformities. Normal posture. Extremities:  Without edema. Neurologic:  Alert and  oriented x4 Psych:  Normal mood and affect.    Assessment:     Plan:  ***   Josette Centers, PA-C [facility] 02/14/2024

## 2024-02-13 NOTE — Telephone Encounter (Signed)
 Received notification from [employer] regarding a prior authorization for OHTUVAYRE . Authorization has been APPROVED from 02/11/2024 to 07/08/2024. Approval letter sent to scan center.  Authorization # [id number]  Sherry Pennant, PharmD, MPH, BCPS, CPP Clinical Pharmacist (Rheumatology and Pulmonology)

## 2024-02-14 ENCOUNTER — Encounter: Payer: Self-pay | Admitting: Gastroenterology

## 2024-02-14 ENCOUNTER — Ambulatory Visit: Admitting: Gastroenterology

## 2024-02-14 VITALS — BP 122/66 | HR 108 | Temp 97.6°F | Ht 67.0 in | Wt 173.4 lb

## 2024-02-14 DIAGNOSIS — K3189 Other diseases of stomach and duodenum: Secondary | ICD-10-CM

## 2024-02-14 DIAGNOSIS — K7581 Nonalcoholic steatohepatitis (NASH): Secondary | ICD-10-CM

## 2024-02-14 DIAGNOSIS — D649 Anemia, unspecified: Secondary | ICD-10-CM | POA: Diagnosis not present

## 2024-02-14 DIAGNOSIS — Z8719 Personal history of other diseases of the digestive system: Secondary | ICD-10-CM | POA: Diagnosis not present

## 2024-02-14 DIAGNOSIS — D696 Thrombocytopenia, unspecified: Secondary | ICD-10-CM

## 2024-02-14 DIAGNOSIS — K7460 Unspecified cirrhosis of liver: Secondary | ICD-10-CM

## 2024-02-14 DIAGNOSIS — Z860101 Personal history of adenomatous and serrated colon polyps: Secondary | ICD-10-CM | POA: Diagnosis not present

## 2024-02-14 NOTE — Patient Instructions (Addendum)
 Please have blood work completed at [facility].   Continue Lasix  40 mg daily.   If your potassium and kidney function look okay, I will plan to restart low-dose spironolactone  25 mg daily due to increased swelling in your lower extremities.  Be sure to follow a strict low-sodium diet.  More than 2000 mg of sodium per day.  If you have evidence of iron deficiency anemia on your blood work, we will need updated colonoscopy and an upper endoscopy.  Nutrition:  High-protein diet from a primarily plant-based diet. Avoid red meat.  No raw or undercooked meat, seafood, or shellfish. Low-fat/cholesterol/carbohydrate diet.  Josette Centers, PA-C [facility] Gastroenterology

## 2024-02-15 LAB — CBC WITH DIFFERENTIAL/PLATELET
Basophils Absolute: 0 x10E3/uL (ref 0.0–0.2)
Basos: 1 %
EOS (ABSOLUTE): 0.1 x10E3/uL (ref 0.0–0.4)
Eos: 2 %
Hematocrit: 39.1 % (ref 37.5–51.0)
Hemoglobin: 12.8 g/dL — ABNORMAL LOW (ref 13.0–17.7)
Immature Grans (Abs): 0.1 x10E3/uL (ref 0.0–0.1)
Immature Granulocytes: 2 %
Lymphocytes Absolute: 1.1 x10E3/uL (ref 0.7–3.1)
Lymphs: 17 %
MCH: 28 pg (ref 26.6–33.0)
MCHC: 32.7 g/dL (ref 31.5–35.7)
MCV: 86 fL (ref 79–97)
Monocytes Absolute: 0.5 x10E3/uL (ref 0.1–0.9)
Monocytes: 7 %
Neutrophils Absolute: 5 x10E3/uL (ref 1.4–7.0)
Neutrophils: 71 %
Platelets: 96 x10E3/uL — CL (ref 150–450)
RBC: 4.57 x10E6/uL (ref 4.14–5.80)
RDW: 15.4 % (ref 11.6–15.4)
WBC: 6.9 x10E3/uL (ref 3.4–10.8)

## 2024-02-15 LAB — IRON,TIBC AND FERRITIN PANEL
Ferritin: 132 ng/mL (ref 30–400)
Iron Saturation: 38 % (ref 15–55)
Iron: 112 ug/dL (ref 38–169)
Total Iron Binding Capacity: 298 ug/dL (ref 250–450)
UIBC: 186 ug/dL (ref 111–343)

## 2024-02-15 LAB — BASIC METABOLIC PANEL WITH GFR
BUN/Creatinine Ratio: 27 — ABNORMAL HIGH (ref 10–24)
BUN: 24 mg/dL (ref 8–27)
CO2: 28 mmol/L (ref 20–29)
Calcium: 9.3 mg/dL (ref 8.6–10.2)
Chloride: 102 mmol/L (ref 96–106)
Creatinine, Ser: 0.89 mg/dL (ref 0.76–1.27)
Glucose: 173 mg/dL — ABNORMAL HIGH (ref 70–99)
Potassium: 4 mmol/L (ref 3.5–5.2)
Sodium: 144 mmol/L (ref 134–144)
eGFR: 93 mL/min/1.73 (ref >59)

## 2024-02-16 ENCOUNTER — Ambulatory Visit: Payer: Self-pay | Admitting: Gastroenterology

## 2024-02-16 DIAGNOSIS — R600 Localized edema: Secondary | ICD-10-CM

## 2024-02-16 MED ORDER — FUROSEMIDE 40 MG PO TABS
ORAL_TABLET | ORAL | 1 refills | Status: AC
Start: 2024-02-16 — End: ?

## 2024-02-17 ENCOUNTER — Other Ambulatory Visit: Payer: Self-pay | Admitting: *Deleted

## 2024-02-17 DIAGNOSIS — D649 Anemia, unspecified: Secondary | ICD-10-CM

## 2024-02-17 DIAGNOSIS — K7460 Unspecified cirrhosis of liver: Secondary | ICD-10-CM

## 2024-02-24 ENCOUNTER — Other Ambulatory Visit: Payer: Self-pay | Admitting: Physician Assistant

## 2024-02-24 DIAGNOSIS — D696 Thrombocytopenia, unspecified: Secondary | ICD-10-CM

## 2024-02-24 NOTE — Progress Notes (Signed)
 Patient who previously followed at our office for thrombocytopenia in the setting of liver cirrhosis, but was lost to follow-up. Notified by gastroenterologist that he would like to reestablish care. We will schedule him for office visit, with labs 1 week prior to include CBC/D, CMP, LDH, immature platelet fraction.  Pleasant CHRISTELLA Barefoot, PA-C 02/24/24 3:18 PM

## 2024-02-25 DIAGNOSIS — K7460 Unspecified cirrhosis of liver: Secondary | ICD-10-CM | POA: Diagnosis not present

## 2024-02-25 DIAGNOSIS — D649 Anemia, unspecified: Secondary | ICD-10-CM | POA: Diagnosis not present

## 2024-02-26 ENCOUNTER — Ambulatory Visit: Payer: Self-pay | Admitting: Gastroenterology

## 2024-02-26 LAB — BASIC METABOLIC PANEL WITH GFR
BUN/Creatinine Ratio: 19 (ref 10–24)
BUN: 18 mg/dL (ref 8–27)
CO2: 24 mmol/L (ref 20–29)
Calcium: 9.7 mg/dL (ref 8.6–10.2)
Chloride: 98 mmol/L (ref 96–106)
Creatinine, Ser: 0.95 mg/dL (ref 0.76–1.27)
Glucose: 323 mg/dL — ABNORMAL HIGH (ref 70–99)
Potassium: 5.1 mmol/L (ref 3.5–5.2)
Sodium: 142 mmol/L (ref 134–144)
eGFR: 87 mL/min/1.73 (ref >59)

## 2024-03-15 ENCOUNTER — Other Ambulatory Visit: Payer: Self-pay | Admitting: Internal Medicine

## 2024-03-17 ENCOUNTER — Other Ambulatory Visit: Payer: Self-pay | Admitting: Gastroenterology

## 2024-03-17 DIAGNOSIS — K219 Gastro-esophageal reflux disease without esophagitis: Secondary | ICD-10-CM

## 2024-03-17 NOTE — Progress Notes (Unsigned)
 [name], male    DOB: [birth year]     MRN: [id number]   Brief patient profile:  76 yowm MM/quit smoking 2012 with COPD GOLD 3 criteria 06/17/20 previously followed by Dr Vonzell referred back to pulmonary clinic 12/09/2019 in [place].   History of Present Illness  12/09/2019  Pulmonary/ 1st office eval/[name]  symbicort  160/spiriva   Chief Complaint  Patient presents with   Pulmonary Consult    Former patient of Dr Vonzell.  Breathing has been worse over the past few wks. Recently treated with Doxy per PCP for COPD exacerbation. He is occ coughing up some green sputum.   flare 11/23/19  rx doxy 5/19 x 7 days  Dyspnea:  Baseline uses [employer] parking / M[employer] = can't walk 100 yards even at a slow pace at a flat grade s stopping due to sob  On 2lpm  Cough: improved less purulent purulent  Sleep: wakes up coughing since onset /sometimes uses saba proair   SABA use: rarely daytime rec Continue protonix  40 mg Take 30-60 min before first meal of the day  GERD diet  Plan A = Automatic = Always=    Budesonide /formoterol  (symbicort  ) 2 puffs first thing in am and 12 hours later and use spiriva  x 2 pffs after the morning symbicort  (ok to change back to the powder after 2 weeks but will need to stop it if you start coughing  Work on inhaler technique  Plan B = Backup (to supplement plan A, not to replace it) Only use your albuterol  inhaler as a rescue medication    Plan C = Crisis (instead of Plan B   Make sure you check your oxygen  saturations at highest level of activity to be sure it stays over 90%   Prednisone  10 mg take  4 each am x 2 days,   2 each am x 2 days,  1 each am x 2 days and stop  For cough > mucinex  dm 1200 mg every 12 hours as needed  Would wait another 2 weeks before you get your first shot for covid 19    [name] eval 08/01/20 rec ST eval done 08/05/20: low risk asp SLP Diet Recommendations Regular solids;Thin liquid  Liquid Administration via Straw;Cup  Medication Administration  Whole meds with liquid  Compensations Slow rate;Small sips/bites  Postural Changes Seated upright at 90 degrees;Remain semi-upright after after feeds/meals (Comment)     [name] 03/02/22 FESS   04/05/2022  f/u ov/[place] office/[name] re: GOLD 3 copd maint on symb/spiriva  = 240 dollars Chief Complaint  Patient presents with   Follow-up    Saw Dr. Karis and had surgery and is doing much better with his breathing   Dyspnea:  mailbox and back using 02 sometimes/  Cough: overall better since sinus surgery  Sleeping: noct better on bed blocks  SABA use: much less hfa/ never neb  02: 3lpm hs / sitting  and walks off it rec Plan A = Automatic = Always=    Breztri  Take 2 puffs first thing in am and then another 2 puffs about 12 hours later.   Plan B = Backup (to supplement plan A, not to replace it) Only use your albuterol  inhaler as a rescue medication  Plan C = Crisis (instead of Plan B but only if Plan B stops working) - only use your albuterol  nebulizer if you first try Plan B and it fails to help Prednisone  10 mg take  4 each am x 2 days,   2  each am x 2 days,  1 each am x 2 days and stop  For cough mucinex  dm 1200 mg every 12 hours as needed  Pantoprazole  (protonix ) 40 mg   Take  30-60 min before first meal of the day and Pepcid  (famotidine )  20 mg (OTC) after supper until return to office GERD diet reviewed, bed blocks rec  If cough not better add another gabapentin  300 mg in am   Make sure you check your oxygen  saturation  AT  your highest level of activity (not after you stop)   to be sure it stays over 90%        Admission date:  06/08/2023     Discharge Date:  06/11/2023    Discharge Diagnosis  Acute on chronic respiratory failure with hypoxia ([name]) [J96.21] Pneumonia due to infectious organism, unspecified laterality, unspecified part of lung [J18.9]     Principal Problem:   Acute on chronic respiratory failure with hypoxia ([name])   Gastroesophageal reflux disease   HTN  (hypertension)   Type 2 diabetes mellitus without complication, without long-term current use of insulin  ([name])   COPD with acute exacerbation ([name])   CAP (community acquired pneumonia)   AKI (acute kidney injury) ([name])     Never got back to baseline since d/c 06/11/23 in terms of sob/cough   07/26/2023  f/u ov/[place] office/[name] re: GOLD 3 / 02 HS  maint on breztri    Chief Complaint  Patient presents with   Follow-up  Worse since finished prednisone   x around a week prior to OV  more congested / green mucus  ? Best rx was levaquin  per wife  Dyspnea:  room to room since Jul 12 2023 mostly tired /some presyncope as well standing / eating and drinking ok  Cough:worse at hs and off and on all night  Sleeping: bed is flat with bunch of pillows    SABA use: last used neb> 12 h prior to OV  and no hfa on day of ov either 02: 3lpm 24/7  sometimes 4lpm but not really titrating to sats  Rec Levaquin  500 mg daily x 10 days  - stop if have any pain ankle tendons  Prednisone  10 mg x 2 each am until better then  1 daily x 5 days and stop  Stop losartan  for now  We may need to stop your  ACTOS in future as it may be contributing with your breathing problems  Please schedule a follow up office visit in  2 weeks, sooner if needed Late add:   next step may be  collect mucus for opportunistic gnr and atypical TB - ask about flutter use next ov also Bring all meds next ov / consider addison's from freq steroid exp     01/28/2024  f/u ov/[name] re: GOLD 3 copd/ 02 dep maint on breztri   /flutter  Chief Complaint  Patient presents with   Follow-up   COPD  Dyspnea:  [employer] now riding scooter - pred helps the most  Cough: brown mucus worse in am / using mucinex  / flutter valve - just finishing course of levaqui Sleeping: hob up 30 degrees s noct  resp cc on L side down  SABA use: up to twice daily neb 02: 3pm  Lung cancer screening :  10/14/23  RADS 2/emphysema  Rec    03/18/2024  f/u  ov/ office/[name] re: GOLD 3 copd /02 dep  maint on ***  No chief complaint on file.   Dyspnea:  ***  Cough: *** Sleeping: ***   resp cc  SABA use: *** 02: ***  Lung cancer screening: ***   No obvious day to day or daytime variability or assoc excess/ purulent sputum or mucus plugs or hemoptysis or cp or chest tightness, subjective wheeze or overt sinus or hb symptoms.    Also denies any obvious fluctuation of symptoms with weather or environmental changes or other aggravating or alleviating factors except as outlined above   No unusual exposure hx or h/o childhood pna/ asthma or knowledge of premature birth.  Current Allergies, Complete Past Medical History, Past Surgical History, Family History, and Social History were reviewed in [employer] record.  ROS  The following are not active complaints unless bolded Hoarseness, sore throat, dysphagia, dental problems, itching, sneezing,  nasal congestion or discharge of excess mucus or purulent secretions, ear ache,   fever, chills, sweats, unintended wt loss or wt gain, classically pleuritic or exertional cp,  orthopnea pnd or arm/hand swelling  or leg swelling, presyncope, palpitations, abdominal pain, anorexia, nausea, vomiting, diarrhea  or change in bowel habits or change in bladder habits, change in stools or change in urine, dysuria, hematuria,  rash, arthralgias, visual complaints, headache, numbness, weakness or ataxia or problems with walking or coordination,  change in mood or  memory.        No outpatient medications have been marked as taking for the 03/18/24 encounter (Appointment) with Valetta Mulroy B, MD.              Past Medical History:  Diagnosis Date   Anxiety    Asthma    Chest pain    a. 2003 Cath: nl cors.   COPD (chronic obstructive pulmonary disease) ([name])    Depression    Diabetes mellitus    Dyslipidemia    Dysrhythmia    Fracture 12/15   lower back L-1   GERD  (gastroesophageal reflux disease)    Hypertension    PONV (postoperative nausea and vomiting)    Splenomegaly        Objective:    Wt   03/18/2024          ***  01/28/2024         166  10/10/2023          168 08/08/2023        167  07/26/2023        162  05/01/2023      170   03/07/2023        167   09/06/2022        163   04/05/2022        156 12/08/2021          164 09/01/2021        167  06/06/2021      162 12/08/2020          163 09/15/2020        154  05/23/2020      166 04/28/2020      166   12/30/19 162 lb (73.5 kg)  12/21/19 162 lb (73.5 kg)  12/09/19 162 lb (73.5 kg)   Vital signs reviewed  03/18/2024  - Note at rest 02 sats  ***% on ***   General appearance:    ***   G  Mod bar 2+ pitting edema both LEs***                          Assessment

## 2024-03-18 ENCOUNTER — Ambulatory Visit: Admitting: Internal Medicine

## 2024-03-18 ENCOUNTER — Encounter: Payer: Self-pay | Admitting: Internal Medicine

## 2024-03-18 VITALS — BP 108/57 | HR 107 | Ht 67.0 in | Wt 173.2 lb

## 2024-03-18 DIAGNOSIS — Z87891 Personal history of nicotine dependence: Secondary | ICD-10-CM | POA: Diagnosis not present

## 2024-03-18 DIAGNOSIS — J449 Chronic obstructive pulmonary disease, unspecified: Secondary | ICD-10-CM

## 2024-03-18 DIAGNOSIS — J9611 Chronic respiratory failure with hypoxia: Secondary | ICD-10-CM

## 2024-03-18 MED ORDER — AMOXICILLIN-POT CLAVULANATE 875-125 MG PO TABS: 1.0000 | ORAL_TABLET | Freq: Two times a day (BID) | ORAL | 0 refills | Status: DC

## 2024-03-18 NOTE — Assessment & Plan Note (Addendum)
 Quit smoking 2012/MM - alpha one AT screen  01/31/17  MM  Level 169 - PFT's  06/18/2019  FEV1 1.12 (35 % ) ratio 0.46  p 0 % improvement from saba p ? prior to study with DLCO  11.91 (48%) corrects to 3.18 (754%)  for alv volume and FV curve classic concavity   - [name] 03/02/22 FESS  - 03/07/2023  continue breztri  - 05/01/2023    continue breztri  and approp saba  -01/28/2024  After extensive coaching inhaler device,  effectiveness =    75% (Ti short) for hfa - 01/28/2024 [place]  ordered/ rec Pulmonary rehab due to refractory doe/pred dependency> could not afford - 03/18/2024  After extensive coaching inhaler device,  effectiveness =    80% hfa  from a baseline of 50%  (short ti)  - 03/18/2024 trained on Flutter valve   Mild flare with ? Assoc rhinosinusitis > rx augmetin / max mucinex  with flutter and pred as plan D as before = - prednisone   10 mg x 2 until better then 1 daily x 5 days and stop

## 2024-03-18 NOTE — Patient Instructions (Addendum)
 My office will be contacting you by phone for referral to [facility] for pulmonary rehab  - if you don't hear back from my office within one week please call us  back or notify us  thru MyChart and we'll address it right away.    Make sure you check your oxygen  saturation  AT  your highest level of activity (not after you stop)   to be sure it stays over 90% and adjust  02 flow upward to maintain this level if needed but remember to turn it back to previous settings when you stop (to conserve your supply).    Augmentin  875 mg take one pill twice daily  X 10 days - take at breakfast and supper with large glass of water .  It would help reduce the usual side effects (diarrhea and yeast infections) if you ate cultured yogurt at lunch.    Also  Ok to try albuterol  15 min before an activity (on alternating days with nebulizer)  that you know would usually make you short of breath and see if it makes any difference and if makes none then don't take albuterol  after activity unless you can't catch your breath as this means it's the resting that helps, not the albuterol .  Try off gabapentin  for a week to see if heps your leg swelling   For cough/ congestion > mucinex  or mucinex  dm  up to maximum of  1200 mg every 12 hours and use the flutter valve as much as you can    Please schedule a follow up visit in 3 months but call sooner if needed

## 2024-03-18 NOTE — Assessment & Plan Note (Addendum)
 Started on 02 around 2019 As of 02/03/2020  2lpm 24/7  -  02/03/2020 referred to adapt for Best fit for amb 02 > 3lpm pulsed tank -  04/28/2020   Walked 3lpm pulsed tank  approx   400 ft  @ moderate pace  stopped due to  Sob/tired  with sats still 95%    -  09/15/2020   Walked 3lpm cont approx   200 ft  @ slow/slt undsteady  pace  stopped due to  Sob/fatigue   With sats 91% at rest sats were 86% RA   12/08/2020   Walked RA  approx   500 ft  @ moderate pace  stopped due to  End of study, a little sob, sats still 90%  - 04/05/2022 patient walked at a moderate pace on room air all three laps @ 150 ft each. Desat to 87% at end of third lap. Patient placed back on 3LO2 cont at end of walk  - 09/06/2022   Walked on 2lpm cont   x  3  lap(s) =  approx 450  ft  @ moderate pace, stopped due to end of study  with lowest 02 sats 93%   - 03/07/2023   Walked on 3lpm   x  2  lap(s) =  approx 300  ft  @ nl  pace, stopped due to sob with lowest 02 sats 88%   -  05/01/2023   Walked on 2lpom con  x  3  lap(s) =  approx 450  ft  @ mod to slow pace, stopped due to sob  with lowest 02 sats 91%    Again advise: Make sure you check your oxygen  saturation at your highest level of activity(NOT after you stop)  to be sure it stays over 90% and keep track of it at least once a week, more often if breathing getting worse, and let me know if losing ground. (Collect the dots to connect the dots approach)           Each maintenance medication was reviewed in detail including emphasizing most importantly the difference between maintenance and prns and under what circumstances the prns are to be triggered using an action plan format where appropriate.  Total time for H and P, chart review, counseling, reviewing hfa/ flutter/ 02 pulse ox / 02  device(s) and generating customized AVS unique to this office visit / same day charting = 32 min

## 2024-03-22 NOTE — Progress Notes (Signed)
 GI Office Note    Referring Provider: Sheryle Carwin, MD Primary Care Physician:  Sheryle Carwin, MD  Primary Gastroenterologist: Ozell Hollingshead, MD   Chief Complaint   Chief Complaint  Patient presents with   Follow-up    Doing well, no issues to discuss    History of Present Illness   [name] is a 68 y.o. male presenting today for follow up. Last seen in 02/2024. H/o GERD, adenomatous colon polyps, hemorrhoids with intermittent brbpr, biopsy proven NASH fibrosis in 2018 now being followed for cirrhosis. Presents today for follow up lower extremity edema.  Discussed the use of AI scribe software for clinical note transcription with the patient, who gave verbal consent to proceed.   He has persistent swelling in his legs, with the right leg being larger than the left for at least a year. He describes the right leg as his 'favorite [place] foot.' Of note, he had equal edema documented 02/14/2024. There is no history of surgeries or vein procedures on the right leg. Approximately two months ago, he nicked the back of his right leg, resulting in fluid drainage for a day and a half, which was managed with tight wrapping, stopping the drainage. He was seen by PCP for this. He denies swelling in the genital area. He recalls a significant injury to his right leg about fifteen years ago when it was pinned between a guide wire and a building, resulting in extensive bruising but no surgery was required. No recent trauma or injury to the leg.  He is currently on oxygen  therapy at three liters. He denies stomach pain and reports a good appetite. He is cautious with salt intake, stating he does not like salt and avoids salty foods. He takes pantoprazole  in the morning and Pepcid  at night for acid reflux, which has improved significantly. He has regular bowel movements every morning without any blood or black stools. No dysphagia or vomiting.  He mentions a recent weight gain, and notes that he has  been on prednisone  for a sinus infection, which he says makes him hungry. He completed the prednisone  course last Saturday and is still on Augmentin  for the sinus infection.  Regarding his liver, he had labs done in May with MELD 3.0 of 6. He has not tolerated spironolactone  due to hyperkalemia. He has had slightly elevated potassium levels despite stopping a medication. Advised to avoid high potassium containing foods. His lasix  was increased from 40mg  in am to adding additional 20mg  in pm.  He is not immune to Hep A or B and is interested in pursuing vaccination at this time. Plans to go to [facility].  Prior Data   Wt Readings from Last 7 Encounters:  03/23/24 175 lb (79.4 kg)  03/18/24 173 lb 3.2 oz (78.6 kg)  02/14/24 173 lb 6.4 oz (78.7 kg)  01/28/24 166 lb (75.3 kg)  11/15/23 167 lb (75.8 kg)  10/18/23 170 lb (77.1 kg)  10/17/23 171 lb 9.6 oz (77.8 kg)   Cirrhosis:  Labs MELD 3.0: 6 on 11/19/23 US : 11/25/23: no focal liver lesion AFP: normal 05/2023 Hep A/B vaccination: completed vaccination in 2019 but appears he only received 1 dose. No immunity on labs in 2022. Hasn't started vaccine series over yet. EGD:  05/22/23- no varices, portal HTN gastropathy. Repeat EGD in 2 years. BB: No Ascites/peripheral edema: has had lower extremity edema. No ascites mentioned on u/s in May Diuretics: stopped spironolactone  due to hyperkalemia Paracentesis: Never History of SBP:  No Encephalopathy: No  Colonoscopy 08/07/2021: - Non- bleeding internal hemorrhoids. - Three 3 to 6 mm polyps in the transverse colon, removed with a cold snare. Resected and retrieved. - The examination was otherwise normal. - Pathology with 1 tubular adenoma, otherwise benign colonic mucosa. - Recommended 5-year surveillance.   EGD 05/2023: - Normal esophagus. - Portal hypertensive gastropathy. - Normal duodenal bulb and second portion of the duodenum. - No specimens collected.   Medications   Current Outpatient  Medications  Medication Sig Dispense Refill   acetaminophen  (TYLENOL ) 325 MG tablet Take 2 tablets (650 mg total) by mouth every 6 (six) hours as needed for mild pain (pain score 1-3) (or Fever >/= 101).     albuterol  (PROVENTIL ) (2.5 MG/3ML) 0.083% nebulizer solution Take 3 mLs (2.5 mg total) by nebulization every 2 (two) hours as needed for wheezing or shortness of breath (cough). 75 mL 2   albuterol  (VENTOLIN  HFA) 108 (90 Base) MCG/ACT inhaler Inhale 2 puffs into the lungs every 4 (four) hours as needed for wheezing or shortness of breath. 8 g 2   ALPRAZolam  (XANAX ) 0.25 MG tablet Take 0.25 mg by mouth 2 (two) times daily as needed for sleep or anxiety.     amoxicillin -clavulanate (AUGMENTIN ) 875-125 MG tablet Take 1 tablet by mouth 2 (two) times daily. 20 tablet 0   aspirin  EC 81 MG tablet Take 81 mg by mouth daily. Swallow whole.     atorvastatin  (LIPITOR) 40 MG tablet Take 40 mg by mouth in the morning.     BREZTRI  AEROSPHERE 160-9-4.8 MCG/ACT AERO INHALE 2 PUFFS INTO THE LUNGS TWICE A DAY 10.7 each 11   Cholecalciferol  (VITAMIN D3) 25 MCG (1000 UT) CAPS Take 1,000 Units by mouth in the morning.     diltiazem  (CARDIZEM  CD) 240 MG 24 hr capsule Take 240 mg by mouth daily.     EMBECTA PEN NEEDLE ULTRAFINE 31G X 5 MM MISC      famotidine  (PEPCID ) 20 MG tablet TAKE 1 TABLET BY MOUTH EVERY DAY AFTER SUPPER 90 tablet 0   fluticasone  (FLONASE ) 50 MCG/ACT nasal spray Place 1 spray into both nostrils daily as needed for allergies or rhinitis.     furosemide  (LASIX ) 40 MG tablet Take 1 tablet (40 mg total) by mouth daily in the morning and take 1/2 tablet (20 mg total) by mouth daily in the afternoon. 135 tablet 1   gabapentin  (NEURONTIN ) 300 MG capsule Take 1 capsule (300 mg total) by mouth at bedtime. 30 capsule 5   glimepiride  (AMARYL ) 1 MG tablet Take 1 mg by mouth daily with breakfast.     insulin  glargine, 2 Unit Dial, (TOUJEO  MAX) 300 UNIT/ML Solostar Pen Inject into the skin.     metFORMIN   (GLUCOPHAGE ) 1000 MG tablet Take 1,000 mg by mouth daily with supper.     Multiple Vitamin (MULTIVITAMIN WITH MINERALS) TABS tablet Take 1 tablet by mouth at bedtime. 30 tablet 2   OXYGEN  Inhale 4 L into the lungs continuous.     pantoprazole  (PROTONIX ) 40 MG tablet TAKE 1 TABLET (40 MG TOTAL) BY MOUTH IN THE MORNING 90 tablet 1   pioglitazone (ACTOS) 15 MG tablet Take 15 mg by mouth daily.     pyridOXINE (B-6) 50 MG tablet Take 50 mg by mouth daily.     sertraline  (ZOLOFT ) 50 MG tablet Take 1 tablet (50 mg total) by mouth daily. 30 tablet 3   tamsulosin  (FLOMAX ) 0.4 MG CAPS capsule Take 0.4 mg by mouth in  the morning.     vitamin C  (ASCORBIC ACID ) 250 MG tablet Take 250 mg by mouth in the morning.     Wheat Dextrin (BENEFIBER PO) Take 1 Capful by mouth daily.     No current facility-administered medications for this visit.    Allergies   Allergies as of 03/23/2024 - Review Complete 03/23/2024  Allergen Reaction Noted   Lioresal [baclofen] Other (See Comments) 07/24/2016   Zocor [simvastatin] Nausea Only 10/05/2013    Review of Systems   General: Negative for anorexia, weight loss, fever, chills, fatigue, weakness. ENT: Negative for hoarseness, difficulty swallowing , nasal congestion. CV: Negative for chest pain, angina, palpitations, +dyspnea on exertion,++ peripheral edema.  Respiratory: Negative for dyspnea at rest,+ dyspnea on exertion, cough, sputum, wheezing.  GI: See history of present illness. GU:  Negative for dysuria, hematuria, urinary incontinence, urinary frequency, nocturnal urination.  Endo: Negative for unusual weight change.     Physical Exam   BP (!) 117/59 (BP Location: Right Arm, Patient Position: Sitting, Cuff Size: Normal)   Pulse 93   Temp 97.7 F (36.5 C) (Oral)   Ht 5' 7 (1.702 m)   Wt 175 lb (79.4 kg)   SpO2 90%   BMI 27.41 kg/m    General: chronically ill appearing pleasant male, wearing oxygen  via [place], in no acute distress.  Eyes: No  icterus. Mouth: Oropharyngeal mucosa moist and pink   Lungs: Clear to auscultation bilaterally.  Heart: Regular rate and rhythm, no murmurs rubs or gallops.  Abdomen: Bowel sounds are normal,   no hepatosplenomegaly or masses,  no abdominal bruits, no rebound or guarding. Distended, only slightly tense. Caput medusa. Small umb hernia easily reducible and nontender. Rectal: not performed Extremities: 3+ edema to knee on RLE. 1+ edema to knee on LLE. No clubbing or deformities. Neuro: Alert and oriented x 4   Skin: Warm and dry, no jaundice.   Psych: Alert and cooperative, normal mood and affect.  Labs   Lab Results  Component Value Date   NA 142 02/25/2024   CL 98 02/25/2024   K 5.1 02/25/2024   CO2 24 02/25/2024   BUN 18 02/25/2024   CREATININE 0.95 02/25/2024   EGFR 87 02/25/2024   CALCIUM  9.7 02/25/2024   ALBUMIN  4.7 11/19/2023   GLUCOSE 323 (H) 02/25/2024   Lab Results  Component Value Date   ALT 78 (H) 11/19/2023   AST 40 11/19/2023   ALKPHOS 139 (H) 11/19/2023   BILITOT 0.6 11/19/2023   Lab Results  Component Value Date   WBC 6.9 02/14/2024   HGB 12.8 (L) 02/14/2024   HCT 39.1 02/14/2024   MCV 86 02/14/2024   PLT 96 (LL) 02/14/2024   Lab Results  Component Value Date   INR 1.0 11/19/2023   INR 1.0 08/08/2023   INR 1.0 06/08/2023   Lab Results  Component Value Date   VITAMINB12 1,821 (H) 11/19/2023    Imaging Studies   No results found.  Assessment/Plan:    NASH cirrhosis: -no hepatoma or ascites on u/s in 11/2023 -MELD 3.0 of 6 in 11/2023 -presented with persistent lower extremity edema and possible some mild worsening in abdominal distention. Did not tolerate spironolactone  due to hyperkalemia -monitor weights weekly, call with 3 pound weight gain -update labs today -would have low threshold to update u/s to evaluate for ascites, due to hepatoma screening in 05/2024 -obtain hep A and B vaccinations -2 gram sodium restricted diet -avoid high  potassium containing foods, update labs today  Chronic right lower extremity edema, greater than left Chronic edema with size difference between legs per patient for at least one year although this was not documented in our office one month ago. Possible venous insufficiency or DVT. - Order ultrasound of the right lower extremity to evaluate for blood clot.    Gastroesophageal reflux disease (GERD) Managed with pantoprazole  and Pepcid      Sonny RAMAN. Ezzard, MHS, PA-C [facility]

## 2024-03-23 ENCOUNTER — Encounter (HOSPITAL_COMMUNITY)
Admission: RE | Admit: 2024-03-23 | Discharge: 2024-03-23 | Disposition: A | Discharge status: Home / Self Care | Source: Ambulatory Visit | Attending: Internal Medicine | Admitting: Internal Medicine

## 2024-03-23 ENCOUNTER — Encounter: Payer: Self-pay | Admitting: Gastroenterology

## 2024-03-23 ENCOUNTER — Ambulatory Visit: Admitting: Gastroenterology

## 2024-03-23 VITALS — BP 117/59 | HR 93 | Temp 97.7°F | Ht 67.0 in | Wt 175.0 lb

## 2024-03-23 DIAGNOSIS — K7460 Unspecified cirrhosis of liver: Secondary | ICD-10-CM | POA: Insufficient documentation

## 2024-03-23 DIAGNOSIS — J449 Chronic obstructive pulmonary disease, unspecified: Secondary | ICD-10-CM | POA: Insufficient documentation

## 2024-03-23 DIAGNOSIS — K7581 Nonalcoholic steatohepatitis (NASH): Secondary | ICD-10-CM

## 2024-03-23 DIAGNOSIS — R600 Localized edema: Secondary | ICD-10-CM | POA: Insufficient documentation

## 2024-03-23 DIAGNOSIS — K219 Gastro-esophageal reflux disease without esophagitis: Secondary | ICD-10-CM | POA: Diagnosis not present

## 2024-03-23 NOTE — Patient Instructions (Addendum)
 Ultrasound of the right leg to rule out blood clot. Please update your labs at [facility] or [facility] lab. Take your orders with you. Please complete Hepatitis A and B vaccinations, this will be a multishot series. Please make sure you follow instructions provided by pharmacy. Continue pantoprazole  in morning before breakfast and famotidine  (Pepcid ) in the evenings for acid reflux. Please weigh yourself once per week at home. Pick the same thing to wear each time on the scales. Try weighing first thing in the morning after emptying your bladder and before eating/drinking for the most consistent weights. Let me know if you gain more than 3 pounds.  Limit high potassium foods. Limit sodium (salt) intake to no more than 2000 mg per day. You can find sodium content on package labels. Too much sodium increases fluid retention.     High potassium foods to avoid:  Dried figs  Molasses  Seaweed    Dried fruits (dates, prunes)  Nuts  Avocados  Bran cereals  Wheat germ  Lima beans    Spinach  Tomatoes  Broccoli  Winter squash  Beets  Carrots  Cauliflower  Potatoes     Bananas  Cantaloupe  Kiwis  Oranges  Mangos     Ground beef  Steak  Pork  Veal  [place]

## 2024-03-23 NOTE — Progress Notes (Signed)
 Virtual orientation visit completed for pulmonary rehab with COPD. On-site orientation visit scheduled for 03/25/24 at 8:30.

## 2024-03-24 ENCOUNTER — Ambulatory Visit (HOSPITAL_COMMUNITY)
Admission: RE | Admit: 2024-03-24 | Discharge: 2024-03-24 | Disposition: A | Discharge status: Home / Self Care | Source: Ambulatory Visit | Attending: Gastroenterology | Admitting: Gastroenterology

## 2024-03-24 ENCOUNTER — Ambulatory Visit: Payer: Self-pay | Admitting: Gastroenterology

## 2024-03-24 DIAGNOSIS — R600 Localized edema: Secondary | ICD-10-CM | POA: Diagnosis not present

## 2024-03-24 DIAGNOSIS — K219 Gastro-esophageal reflux disease without esophagitis: Secondary | ICD-10-CM | POA: Insufficient documentation

## 2024-03-24 DIAGNOSIS — K7460 Unspecified cirrhosis of liver: Secondary | ICD-10-CM | POA: Diagnosis not present

## 2024-03-24 LAB — CBC WITH DIFFERENTIAL/PLATELET
Basophils Absolute: 0.1 x10E3/uL (ref 0.0–0.2)
Basos: 1 %
EOS (ABSOLUTE): 0.2 x10E3/uL (ref 0.0–0.4)
Eos: 2 %
Hematocrit: 45.4 % (ref 37.5–51.0)
Hemoglobin: 14.3 g/dL (ref 13.0–17.7)
Immature Grans (Abs): 0.1 x10E3/uL (ref 0.0–0.1)
Immature Granulocytes: 1 %
Lymphocytes Absolute: 2.1 x10E3/uL (ref 0.7–3.1)
Lymphs: 25 %
MCH: 27.4 pg (ref 26.6–33.0)
MCHC: 31.5 g/dL (ref 31.5–35.7)
MCV: 87 fL (ref 79–97)
Monocytes Absolute: 0.6 x10E3/uL (ref 0.1–0.9)
Monocytes: 8 %
Neutrophils Absolute: 5.3 x10E3/uL (ref 1.4–7.0)
Neutrophils: 63 %
Platelets: 105 x10E3/uL — ABNORMAL LOW (ref 150–450)
RBC: 5.22 x10E6/uL (ref 4.14–5.80)
RDW: 15.4 % (ref 11.6–15.4)
WBC: 8.3 x10E3/uL (ref 3.4–10.8)

## 2024-03-24 LAB — AFP TUMOR MARKER: AFP, Serum, Tumor Marker: 1.8 ng/mL (ref 0.0–8.4)

## 2024-03-24 LAB — COMPREHENSIVE METABOLIC PANEL WITH GFR
ALT: 30 IU/L (ref 0–44)
AST: 18 IU/L (ref 0–40)
Albumin: 4.4 g/dL (ref 3.9–4.9)
Alkaline Phosphatase: 103 IU/L (ref 49–135)
BUN/Creatinine Ratio: 20 (ref 10–24)
BUN: 21 mg/dL (ref 8–27)
Bilirubin Total: 0.7 mg/dL (ref 0.0–1.2)
CO2: 26 mmol/L (ref 20–29)
Calcium: 9.4 mg/dL (ref 8.6–10.2)
Chloride: 100 mmol/L (ref 96–106)
Creatinine, Ser: 1.03 mg/dL (ref 0.76–1.27)
Globulin, Total: 2.9 g/dL (ref 1.5–4.5)
Glucose: 104 mg/dL — ABNORMAL HIGH (ref 70–99)
Potassium: 3.9 mmol/L (ref 3.5–5.2)
Sodium: 145 mmol/L — ABNORMAL HIGH (ref 134–144)
Total Protein: 7.3 g/dL (ref 6.0–8.5)
eGFR: 79 mL/min/1.73 (ref >59)

## 2024-03-24 LAB — PROTIME-INR
INR: 1 (ref 0.9–1.2)
Prothrombin Time: 10.5 s (ref 9.1–12.0)

## 2024-03-25 ENCOUNTER — Encounter (HOSPITAL_COMMUNITY)
Admission: RE | Admit: 2024-03-25 | Discharge: 2024-03-25 | Disposition: A | Discharge status: Home / Self Care | Source: Ambulatory Visit | Attending: Internal Medicine | Admitting: Internal Medicine

## 2024-03-25 VITALS — Ht 68.0 in | Wt 170.2 lb

## 2024-03-25 DIAGNOSIS — J449 Chronic obstructive pulmonary disease, unspecified: Secondary | ICD-10-CM

## 2024-03-25 NOTE — Progress Notes (Signed)
 Pulmonary Individual Treatment Plan  Patient Details  Name: [name] MRN: [id number] Date of Birth: 1956/01/02 Referring Provider:   Flowsheet Row PULMONARY REHAB OTHER RESP ORIENTATION from 03/25/2024 in [facility] CARDIAC REHABILITATION  Referring Provider Darlean Sharper MD    Initial Encounter Date:  Flowsheet Row PULMONARY REHAB OTHER RESP ORIENTATION from 03/25/2024 in [facility] CARDIAC REHABILITATION  Date 03/25/24    Visit Diagnosis: Chronic obstructive pulmonary disease, unspecified COPD type ([name])  Patient's Home Medications on Admission:  Current Outpatient Medications:    acetaminophen  (TYLENOL ) 325 MG tablet, Take 2 tablets (650 mg total) by mouth every 6 (six) hours as needed for mild pain (pain score 1-3) (or Fever >/= 101)., Disp: , Rfl:    albuterol  (PROVENTIL ) (2.5 MG/3ML) 0.083% nebulizer solution, Take 3 mLs (2.5 mg total) by nebulization every 2 (two) hours as needed for wheezing or shortness of breath (cough)., Disp: 75 mL, Rfl: 2   albuterol  (VENTOLIN  HFA) 108 (90 Base) MCG/ACT inhaler, Inhale 2 puffs into the lungs every 4 (four) hours as needed for wheezing or shortness of breath., Disp: 8 g, Rfl: 2   ALPRAZolam  (XANAX ) 0.25 MG tablet, Take 0.25 mg by mouth 2 (two) times daily as needed for sleep or anxiety., Disp: , Rfl:    amoxicillin -clavulanate (AUGMENTIN ) 875-125 MG tablet, Take 1 tablet by mouth 2 (two) times daily., Disp: 20 tablet, Rfl: 0   aspirin  EC 81 MG tablet, Take 81 mg by mouth daily. Swallow whole., Disp: , Rfl:    atorvastatin  (LIPITOR) 40 MG tablet, Take 40 mg by mouth in the morning., Disp: , Rfl:    BREZTRI  AEROSPHERE 160-9-4.8 MCG/ACT AERO, INHALE 2 PUFFS INTO THE LUNGS TWICE A DAY, Disp: 10.7 each, Rfl: 11   Cholecalciferol  (VITAMIN D3) 25 MCG (1000 UT) CAPS, Take 1,000 Units by mouth in the morning., Disp: , Rfl:    diltiazem  (CARDIZEM  CD) 240 MG 24 hr capsule, Take 240 mg by mouth daily., Disp: , Rfl:    EMBECTA PEN NEEDLE ULTRAFINE 31G  X 5 MM MISC, , Disp: , Rfl:    famotidine  (PEPCID ) 20 MG tablet, TAKE 1 TABLET BY MOUTH EVERY DAY AFTER SUPPER, Disp: 90 tablet, Rfl: 0   fluticasone  (FLONASE ) 50 MCG/ACT nasal spray, Place 1 spray into both nostrils daily as needed for allergies or rhinitis., Disp: , Rfl:    furosemide  (LASIX ) 40 MG tablet, Take 1 tablet (40 mg total) by mouth daily in the morning and take 1/2 tablet (20 mg total) by mouth daily in the afternoon., Disp: 135 tablet, Rfl: 1   gabapentin  (NEURONTIN ) 300 MG capsule, Take 1 capsule (300 mg total) by mouth at bedtime., Disp: 30 capsule, Rfl: 5   glimepiride  (AMARYL ) 1 MG tablet, Take 1 mg by mouth daily with breakfast., Disp: , Rfl:    insulin  glargine, 2 Unit Dial, (TOUJEO  MAX) 300 UNIT/ML Solostar Pen, Inject into the skin., Disp: , Rfl:    metFORMIN  (GLUCOPHAGE ) 1000 MG tablet, Take 1,000 mg by mouth daily with supper., Disp: , Rfl:    Multiple Vitamin (MULTIVITAMIN WITH MINERALS) TABS tablet, Take 1 tablet by mouth at bedtime., Disp: 30 tablet, Rfl: 2   OXYGEN , Inhale 4 L into the lungs continuous., Disp: , Rfl:    pantoprazole  (PROTONIX ) 40 MG tablet, TAKE 1 TABLET (40 MG TOTAL) BY MOUTH IN THE MORNING, Disp: 90 tablet, Rfl: 1   pioglitazone (ACTOS) 15 MG tablet, Take 15 mg by mouth daily., Disp: , Rfl:    pyridOXINE (  B-6) 50 MG tablet, Take 50 mg by mouth daily., Disp: , Rfl:    sertraline  (ZOLOFT ) 50 MG tablet, Take 1 tablet (50 mg total) by mouth daily., Disp: 30 tablet, Rfl: 3   tamsulosin  (FLOMAX ) 0.4 MG CAPS capsule, Take 0.4 mg by mouth in the morning., Disp: , Rfl:    vitamin C  (ASCORBIC ACID ) 250 MG tablet, Take 250 mg by mouth in the morning., Disp: , Rfl:    Wheat Dextrin (BENEFIBER PO), Take 1 Capful by mouth daily., Disp: , Rfl:   Past Medical History: Past Medical History:  Diagnosis Date   Anxiety    Arthritis    Asthma    Chest pain    a. 2003 Cath: nl cors.   Cirrhosis ([name])    secondary to MASH   Concussion    COPD (chronic obstructive  pulmonary disease) ([name])    chronically on 3-4L [place]   Depression    Diabetes mellitus    Dyslipidemia    Tachycardia   Dyspnea    Dysrhythmia    Fracture 06/2014   lower back L-1   GERD (gastroesophageal reflux disease)    Hypertension    NASH (nonalcoholic steatohepatitis) 2018   [name], undergoing routine cirrhosis care; [name]   Pneumonia 12/01/2021   PONV (postoperative nausea and vomiting)    Splenomegaly    Thrombocytopenia ([name])     Tobacco Use: Social History   Tobacco Use  Smoking Status Former   Current packs/day: 0.00   Average packs/day: 1.3 packs/day for 36.0 years (45.0 ttl pk-yrs)   Types: Cigarettes   Start date: 04/1975   Quit date: 04/2011   Years since quitting: 12.9  Smokeless Tobacco Never    Labs: Review Flowsheet  More data exists      Latest Ref Rng & Units 05/23/2020 08/18/2020 11/27/2021 06/08/2023 06/09/2023  Labs for ITP Cardiac and Pulmonary Rehab  Hemoglobin A1c 4.8 - 5.6 % 7.9  7.3  6.8  - 7.2   PH, Arterial 7.350 - 7.450 - 7.292  - - -  PCO2 arterial 32.0 - 48.0 mmHg - 42.4  - - -  Bicarbonate 20.0 - 28.0 mmol/L - 19.4  - 31.2  -  Acid-base deficit 0.0 - 2.0 mmol/L - 5.6  - - -  O2 Saturation % - 91.2  - 79.2  -     Pulmonary Assessment Scores:  Pulmonary Assessment Scores     Row Name 03/25/24 0934         ADL UCSD   ADL Phase Entry     SOB Score total 56     Rest 1     Walk 2     Stairs 4     Bath 3     Dress 2     Shop 2       CAT Score   CAT Score 25       mMRC Score   mMRC Score 3        UCSD: Self-administered rating of dyspnea associated with activities of daily living (ADLs) 6-point scale (0 = not at all to 5 = maximal or unable to do because of breathlessness)  Scoring Scores range from 0 to 120.  Minimally important difference is 5 units  CAT: CAT can identify the health impairment of COPD patients and is better correlated with disease progression.   CAT has a scoring range of zero to 40. The CAT score is classified  into four groups of low (less than 10), medium (10 - 20), high (21-30) and very high (31-40) based on the impact level of disease on health status. A CAT score over 10 suggests significant symptoms.  A worsening CAT score could be explained by an exacerbation, poor medication adherence, poor inhaler technique, or progression of COPD or comorbid conditions.  CAT MCID is 2 points  mMRC: mMRC (Modified Medical Research Council) Dyspnea Scale is used to assess the degree of baseline functional disability in patients of respiratory disease due to dyspnea. No minimal important difference is established. A decrease in score of 1 point or greater is considered a positive change.   Pulmonary Function Assessment:   Exercise Target Goals: Exercise Program Goal: Individual exercise prescription set using results from initial 6 min walk test and THRR while considering  patient's activity barriers and safety.   Exercise Prescription Goal: Initial exercise prescription builds to 30-45 minutes a day of aerobic activity, 2-3 days per week.  Home exercise guidelines will be given to patient during program as part of exercise prescription that the participant will acknowledge.  Education: Aerobic Exercise: - Group verbal and visual presentation on the components of exercise prescription. Introduces F.I.T.T principle from ACSM for exercise prescriptions.  Reviews F.I.T.T. principles of aerobic exercise including progression. Written material provided at class time.   Education: Resistance Exercise: - Group verbal and visual presentation on the components of exercise prescription. Introduces F.I.T.T principle from ACSM for exercise prescriptions  Reviews F.I.T.T. principles of resistance exercise including progression. Written material provided at class time.    Education: Exercise & Equipment Safety: - Individual verbal instruction and  demonstration of equipment use and safety with use of the equipment.   Education: Exercise Physiology & General Exercise Guidelines: - Group verbal and written instruction with models to review the exercise physiology of the cardiovascular system and associated critical values. Provides general exercise guidelines with specific guidelines to those with heart or lung disease.    Education: Flexibility, Balance, Mind/Body Relaxation: - Group verbal and visual presentation with interactive activity on the components of exercise prescription. Introduces F.I.T.T principle from ACSM for exercise prescriptions. Reviews F.I.T.T. principles of flexibility and balance exercise training including progression. Also discusses the mind body connection.  Reviews various relaxation techniques to help reduce and manage stress (i.e. Deep breathing, progressive muscle relaxation, and visualization). Balance handout provided to take home. Written material provided at class time.   Activity Barriers & Risk Stratification:  Activity Barriers & Cardiac Risk Stratification - 03/23/24 1314       Activity Barriers & Cardiac Risk Stratification   Activity Barriers Back Problems;Arthritis;Shortness of Breath;History of Falls   OA right hip.         6 Minute Walk:  6 Minute Walk     Row Name 03/25/24 0941         6 Minute Walk   Phase Initial     Distance 800 feet     Walk Time 6 minutes     # of Rest Breaks 0     MPH 1.51     METS 2.25     RPE 13     Perceived Dyspnea  1     VO2 Peak 7.88     Symptoms No     Resting HR 103 bpm     Resting BP 106/60     Resting Oxygen  Saturation  95 %     Exercise Oxygen  Saturation  during 6 min walk 88 %  Max Ex. HR 125 bpm     Max Ex. BP 126/62     2 Minute Post BP 106/62       Interval HR   1 Minute HR 116     2 Minute HR 119     3 Minute HR 120     4 Minute HR 122     5 Minute HR 123     6 Minute HR 125     2 Minute Post HR 97     Interval Heart  Rate? Yes       Interval Oxygen    Interval Oxygen ? Yes     Baseline Oxygen  Saturation % 95 %     1 Minute Oxygen  Saturation % 93 %     1 Minute Liters of Oxygen  3 L     2 Minute Oxygen  Saturation % 90 %     2 Minute Liters of Oxygen  3 L     3 Minute Oxygen  Saturation % 88 %     3 Minute Liters of Oxygen  3 L     4 Minute Oxygen  Saturation % 91 %     4 Minute Liters of Oxygen  3 L     5 Minute Oxygen  Saturation % 91 %     5 Minute Liters of Oxygen  3 L     6 Minute Oxygen  Saturation % 93 %     6 Minute Liters of Oxygen  3 L     2 Minute Post Oxygen  Saturation % 97 %     2 Minute Post Liters of Oxygen  3 L       Oxygen  Initial Assessment:  Oxygen  Initial Assessment - 03/25/24 0944       Initial 6 min Walk   Oxygen  Used Continuous    Liters per minute 3      Program Oxygen  Prescription   Program Oxygen  Prescription Continuous    Liters per minute 3          Oxygen  Re-Evaluation:   Oxygen  Discharge (Final Oxygen  Re-Evaluation):   Initial Exercise Prescription:  Initial Exercise Prescription - 03/25/24 0900       Date of Initial Exercise RX and Referring Provider   Date 03/25/24    Referring Provider Darlean Sharper MD      Oxygen    Oxygen  Continuous    Liters 3    Maintain Oxygen  Saturation 88% or higher      Prescription Details   Frequency (times per week) 2    Duration Progress to 30 minutes of continuous aerobic without signs/symptoms of physical distress      Intensity   THRR 40-80% of Max Heartrate 119-141    Ratings of Perceived Exertion 11-13    Perceived Dyspnea 0-4      Resistance Training   Training Prescription Yes    Weight 3    Reps 10-15          Perform Capillary Blood Glucose checks as needed.  Exercise Prescription Changes:   Exercise Prescription Changes     Row Name 03/25/24 0900             Response to Exercise   Blood Pressure (Admit) 106/60       Blood Pressure (Exercise) 126/62       Blood Pressure (Exit) 106/62        Heart Rate (Admit) 103 bpm       Heart Rate (Exercise) 125 bpm       Heart Rate (Exit) 97 bpm  Oxygen  Saturation (Admit) 95 %       Oxygen  Saturation (Exercise) 88 %       Oxygen  Saturation (Exit) 97 %       Rating of Perceived Exertion (Exercise) 13       Perceived Dyspnea (Exercise) 1          Exercise Comments:   Exercise Goals and Review:   Exercise Goals Re-Evaluation :   Discharge Exercise Prescription (Final Exercise Prescription Changes):  Exercise Prescription Changes - 03/25/24 0900       Response to Exercise   Blood Pressure (Admit) 106/60    Blood Pressure (Exercise) 126/62    Blood Pressure (Exit) 106/62    Heart Rate (Admit) 103 bpm    Heart Rate (Exercise) 125 bpm    Heart Rate (Exit) 97 bpm    Oxygen  Saturation (Admit) 95 %    Oxygen  Saturation (Exercise) 88 %    Oxygen  Saturation (Exit) 97 %    Rating of Perceived Exertion (Exercise) 13    Perceived Dyspnea (Exercise) 1          Nutrition:  Target Goals: Understanding of nutrition guidelines, daily intake of sodium 1500mg , cholesterol 200mg , calories 30% from fat and 7% or less from saturated fats, daily to have 5 or more servings of fruits and vegetables.  Education: Nutrition 1 -Group instruction provided by verbal, written material, interactive activities, discussions, models, and posters to present general guidelines for heart healthy nutrition including macronutrients, label reading, and promoting whole foods over processed counterparts. Education serves as Pensions consultant of discussion of heart healthy eating for all. Written material provided at class time.     Education: Nutrition 2 -Group instruction provided by verbal, written material, interactive activities, discussions, models, and posters to present general guidelines for heart healthy nutrition including sodium, cholesterol, and saturated fat. Providing guidance of habit forming to improve blood pressure, cholesterol, and body  weight. Written material provided at class time.     Biometrics:  Pre Biometrics - 03/25/24 0943       Pre Biometrics   Height 5' 8 (1.727 m)    Weight 77.2 kg    Waist Circumference 46 inches    Hip Circumference 39 inches    Waist to Hip Ratio 1.18 %    BMI (Calculated) 25.88    Grip Strength 27.2 kg    Single Leg Stand 0 seconds           Nutrition Therapy Plan and Nutrition Goals:   Nutrition Assessments:  MEDIFICTS Score Key: >=70 Need to make dietary changes  40-70 Heart Healthy Diet <= 40 Therapeutic Level Cholesterol Diet  Flowsheet Row PULMONARY REHAB OTHER RESP ORIENTATION from 03/25/2024 in [facility] CARDIAC REHABILITATION  Picture Your Plate Total Score on Admission 65   Picture Your Plate Scores: <59 Unhealthy dietary pattern with much room for improvement. 41-50 Dietary pattern unlikely to meet recommendations for good health and room for improvement. 51-60 More healthful dietary pattern, with some room for improvement.  >60 Healthy dietary pattern, although there may be some specific behaviors that could be improved.   Nutrition Goals Re-Evaluation:   Nutrition Goals Discharge (Final Nutrition Goals Re-Evaluation):   Psychosocial: Target Goals: Acknowledge presence or absence of significant depression and/or stress, maximize coping skills, provide positive support system. Participant is able to verbalize types and ability to use techniques and skills needed for reducing stress and depression.   Education: Stress, Anxiety, and Depression - Group verbal and visual presentation to define  topics covered.  Reviews how body is impacted by stress, anxiety, and depression.  Also discusses healthy ways to reduce stress and to treat/manage anxiety and depression.  Written material provided at class time.   Education: Sleep Hygiene -Provides group verbal and written instruction about how sleep can affect your health.  Define sleep hygiene, discuss sleep  cycles and impact of sleep habits. Review good sleep hygiene tips.    Initial Review & Psychosocial Screening:  Initial Psych Review & Screening - 03/23/24 1331       Initial Review   Current issues with History of Depression;Current Psychotropic Meds;Current Sleep Concerns      Family Dynamics   Good Support System? Yes    Comments Patient wife and daughter support him.      Barriers   Psychosocial barriers to participate in program The patient should benefit from training in stress management and relaxation.      Screening Interventions   Interventions To provide support and resources with identified psychosocial needs;Provide feedback about the scores to participant;Encouraged to exercise    Expected Outcomes Short Term goal: Utilizing psychosocial counselor, staff and physician to assist with identification of specific Stressors or current issues interfering with healing process. Setting desired goal for each stressor or current issue identified.;Long Term Goal: Stressors or current issues are controlled or eliminated.;Short Term goal: Identification and review with participant of any Quality of Life or Depression concerns found by scoring the questionnaire.;Long Term goal: The participant improves quality of Life and PHQ9 Scores as seen by post scores and/or verbalization of changes          Quality of Life Scores:  Scores of 19 and below usually indicate a poorer quality of life in these areas.  A difference of  2-3 points is a clinically meaningful difference.  A difference of 2-3 points in the total score of the Quality of Life Index has been associated with significant improvement in overall quality of life, self-image, physical symptoms, and general health in studies assessing change in quality of life.  PHQ-9: Review Flowsheet       03/25/2024 08/16/2020 01/15/2018 12/18/2017  Depression screen PHQ 2/9  Decreased Interest 2 0 0 0  Down, Depressed, Hopeless 1 0 0 0  PHQ - 2  Score 3 0 0 0  Altered sleeping 1 - - -  Tired, decreased energy 1 - - -  Change in appetite 1 - - -  Feeling bad or failure about yourself  1 - - -  Trouble concentrating 1 - - -  Moving slowly or fidgety/restless 1 - - -  Suicidal thoughts 0 - - -  PHQ-9 Score 9 - - -  Difficult doing work/chores Somewhat difficult - - -   Interpretation of Total Score  Total Score Depression Severity:  1-4 = Minimal depression, 5-9 = Mild depression, 10-14 = Moderate depression, 15-19 = Moderately severe depression, 20-27 = Severe depression   Psychosocial Evaluation and Intervention:  Psychosocial Evaluation - 03/23/24 1332       Psychosocial Evaluation & Interventions   Interventions Stress management education;Relaxation education;Encouraged to exercise with the program and follow exercise prescription    Comments Patient was referred to pulmonary rehab with COPD. He says he does have some depression/anxiety managed with Zoloft  and Alprazolam . He lives with his wife who is his main support person along with his daughter when needed. He says he has trouble staying asleep some nights. His goals for the program are to improve his  mobility and breathing. 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 He has no barriers identified to complete the program.    Expected Outcomes Short Term: Patient will start the program and attend consistently. Long Term: Patient will complete the program meeting personal goals.    Continue Psychosocial Services  Follow up required by staff          Psychosocial Re-Evaluation:   Psychosocial Discharge (Final Psychosocial Re-Evaluation):   Education: Education Goals: Education classes will be provided on a weekly basis, covering required topics. Participant will state understanding/return demonstration of topics presented.  Learning Barriers/Preferences:  Learning Barriers/Preferences - 03/23/24  1330       Learning Barriers/Preferences   Learning Barriers None    Learning Preferences Skilled Demonstration;Pictoral          General Pulmonary Education Topics:  Infection Prevention: - Provides verbal and written material to individual with discussion of infection control including proper hand washing and proper equipment cleaning during exercise session.   Falls Prevention: - Provides verbal and written material to individual with discussion of falls prevention and safety.   Chronic Lung Disease Review: - Group verbal instruction with posters, models, PowerPoint presentations and videos,  to review new updates, new respiratory medications, new advancements in procedures and treatments. Providing information on websites and 800 numbers for continued self-education. Includes information about supplement oxygen , available portable oxygen  systems, continuous and intermittent flow rates, oxygen  safety, concentrators, and Medicare reimbursement for oxygen . Explanation of Pulmonary Drugs, including class, frequency, complications, importance of spacers, rinsing mouth after steroid MDI's, and proper cleaning methods for nebulizers. Review of basic lung anatomy and physiology related to function, structure, and complications of lung disease. Review of risk factors. Discussion about methods for diagnosing sleep apnea and types of masks and machines for OSA. Includes a review of the use of types of environmental controls: home humidity, furnaces, filters, dust mite/pet prevention, HEPA vacuums. Discussion about weather changes, air quality and the benefits of nasal washing. Instruction on Warning signs, infection symptoms, calling MD promptly, preventive modes, and value of vaccinations. Review of effective airway clearance, coughing and/or vibration techniques. Emphasizing that all should Create an Action Plan. Written material provided at class time.   AED/CPR: - Group verbal and written  instruction with the use of models to demonstrate the basic use of the AED with the basic ABC's of resuscitation.    Tests and Procedures:  - Group verbal and visual presentation and models provide information about basic cardiac anatomy and function. Reviews the testing methods done to diagnose heart disease and the outcomes of the test results. Describes the treatment choices: Medical Management, Angioplasty, or Coronary Bypass Surgery for treating various heart conditions including Myocardial Infarction, Angina, Valve Disease, and Cardiac Arrhythmias.  Written material provided at class time.   Medication Safety: - Group verbal and visual instruction to review commonly prescribed medications for heart and lung disease. Reviews the medication, class of the drug, and side effects. Includes the steps to properly store meds and maintain the prescription regimen.  Written material given at graduation.   Other: -Provides group and verbal instruction on various topics (see comments)   Knowledge Questionnaire Score:  Knowledge Questionnaire Score - 03/25/24 0951       Knowledge Questionnaire Score   Pre Score 15/18           Core Components/Risk Factors/Patient Goals at Admission:  Personal Goals and Risk Factors at Admission - 03/23/24 1330       Core Components/Risk Factors/Patient Goals on Admission    Weight Management Weight Maintenance    Improve shortness of breath with ADL's Yes    Intervention Provide education, individualized exercise plan and daily activity instruction to help decrease symptoms of SOB with activities of daily living.    Expected Outcomes Short Term: Improve cardiorespiratory fitness to achieve a reduction of symptoms when performing ADLs;Long Term: Be able to perform more ADLs without symptoms or delay the onset of symptoms    Increase knowledge of respiratory medications and ability to use respiratory devices properly  Yes    Intervention Provide education  and demonstration as needed of appropriate use of medications, inhalers, and oxygen  therapy.    Expected Outcomes Short Term: Achieves understanding of medications use. Understands that oxygen  is a medication prescribed by physician. Demonstrates appropriate use of inhaler and oxygen  therapy.;Long Term: Maintain appropriate use of medications, inhalers, and oxygen  therapy.    Diabetes Yes    Intervention Provide education about signs/symptoms and action to take for hypo/hyperglycemia.;Provide education about proper nutrition, including hydration, and aerobic/resistive exercise prescription along with prescribed medications to achieve blood glucose in normal ranges: Fasting glucose 65-99 mg/dL    Expected Outcomes Short Term: Participant verbalizes understanding of the signs/symptoms and immediate care of hyper/hypoglycemia, proper foot care and importance of medication, aerobic/resistive exercise and nutrition plan for blood glucose control.;Long Term: Attainment of HbA1C < 7%.    Hypertension Yes    Intervention Provide education on lifestyle modifcations including regular physical activity/exercise, weight management, moderate sodium restriction and increased consumption of fresh fruit, vegetables, and low fat dairy, alcohol moderation, and smoking cessation.;Monitor prescription use compliance.    Expected Outcomes Short Term: Continued assessment and intervention until BP is < 140/59mm HG in hypertensive participants. < 130/37mm HG in hypertensive participants with diabetes, heart failure or chronic kidney disease.;Long Term: Maintenance of blood pressure at goal levels.    Lipids Yes    Intervention Provide education and support for participant on nutrition & aerobic/resistive exercise along with prescribed medications to achieve LDL 70mg , HDL >40mg .    Expected Outcomes Short Term: Participant states understanding of desired cholesterol values and is compliant with medications prescribed.  Participant is following exercise prescription and nutrition guidelines.;Long Term: Cholesterol controlled with medications as prescribed, with individualized exercise RX and with personalized nutrition plan. Value goals: LDL < 70mg , HDL > 40 mg.          Education:Diabetes - Individual verbal and written instruction to review signs/symptoms of diabetes, desired ranges of glucose level fasting, after meals and with exercise. Acknowledge that pre and post exercise glucose checks will be done for 3 sessions at entry of program.   Know Your Numbers and Heart Failure: - Group verbal and visual instruction to discuss disease risk factors for cardiac and pulmonary disease and treatment options.  Reviews associated critical values for Overweight/Obesity, Hypertension, Cholesterol, and Diabetes.  Discusses basics of heart failure: signs/symptoms and treatments.  Introduces Heart Failure Zone chart for action plan for heart failure. Written material provided at class time.   Core Components/Risk Factors/Patient Goals Review:    Core Components/Risk Factors/Patient Goals at Discharge (Final Review):    ITP Comments:  ITP Comments     Row Name 03/23/24 1338           ITP Comments Virtual orientation visit completed for pulmonary rehab with COPD. On-site orientation visit scheduled for  03/25/24 at 8:30.          Comments: Patient arrived for 1st visit/orientation/education at 0830. Patient was referred to PR by Dr. Darlean due to COPD. During orientation advised patient on arrival and appointment times what to wear, what to do before, during and after exercise. Reviewed attendance and class policy.  Pt is scheduled to return Pulmonary Rehab on 03/31/24 at 10:30. Pt was advised to come to class 15 minutes before class starts.  Discussed RPE/Dpysnea scales. Patient participated in warm up stretches. Patient was able to complete 6 minute walk test. Patient was measured for the equipment. Discussed  equipment safety with patient. Took patient pre-anthropometric measurements. Patient finished visit at 950.

## 2024-03-25 NOTE — Patient Instructions (Signed)
 Patient Instructions  Patient Details  Name: [name] MRN: [id number] Date of Birth: 26-Jul-1955 Referring Provider:  Darlean Ozell NOVAK, MD  Below are your personal goals for exercise, nutrition, and risk factors. Our goal is to help you stay on track towards obtaining and maintaining these goals. We will be discussing your progress on these goals with you throughout the program.  Initial Exercise Prescription:  Initial Exercise Prescription - 03/25/24 0900       Date of Initial Exercise RX and Referring Provider   Date 03/25/24    Referring Provider Darlean Ozell MD      Oxygen    Oxygen  Continuous    Liters 3    Maintain Oxygen  Saturation 88% or higher      Prescription Details   Frequency (times per week) 2    Duration Progress to 30 minutes of continuous aerobic without signs/symptoms of physical distress      Intensity   THRR 40-80% of Max Heartrate 119-141    Ratings of Perceived Exertion 11-13    Perceived Dyspnea 0-4      Resistance Training   Training Prescription Yes    Weight 3    Reps 10-15          Exercise Goals: Frequency: Be able to perform aerobic exercise two to three times per week in program working toward 2-5 days per week of home exercise.  Intensity: Work with a perceived exertion of 11 (fairly light) - 15 (hard) while following your exercise prescription.  We will make changes to your prescription with you as you progress through the program.   Duration: Be able to do 30 to 45 minutes of continuous aerobic exercise in addition to a 5 minute warm-up and a 5 minute cool-down routine.   Nutrition Goals: Your personal nutrition goals will be established when you do your nutrition analysis with the dietician.  The following are general nutrition guidelines to follow: Cholesterol < 200mg /day Sodium < 1500mg /day Fiber: Men over 50 yrs - 30 grams per day  Personal Goals:  Personal Goals and Risk Factors at Admission - 03/23/24 1330        Core Components/Risk Factors/Patient Goals on Admission    Weight Management Weight Maintenance    Improve shortness of breath with ADL's Yes    Intervention Provide education, individualized exercise plan and daily activity instruction to help decrease symptoms of SOB with activities of daily living.    Expected Outcomes Short Term: Improve cardiorespiratory fitness to achieve a reduction of symptoms when performing ADLs;Long Term: Be able to perform more ADLs without symptoms or delay the onset of symptoms    Increase knowledge of respiratory medications and ability to use respiratory devices properly  Yes    Intervention Provide education and demonstration as needed of appropriate use of medications, inhalers, and oxygen  therapy.    Expected Outcomes Short Term: Achieves understanding of medications use. Understands that oxygen  is a medication prescribed by physician. Demonstrates appropriate use of inhaler and oxygen  therapy.;Long Term: Maintain appropriate use of medications, inhalers, and oxygen  therapy.    Diabetes Yes    Intervention Provide education about signs/symptoms and action to take for hypo/hyperglycemia.;Provide education about proper nutrition, including hydration, and aerobic/resistive exercise prescription along with prescribed medications to achieve blood glucose in normal ranges: Fasting glucose 65-99 mg/dL    Expected Outcomes Short Term: Participant verbalizes understanding of the signs/symptoms and immediate care of hyper/hypoglycemia, proper foot care and importance of medication, aerobic/resistive exercise and nutrition plan for  blood glucose control.;Long Term: Attainment of HbA1C < 7%.    Hypertension Yes    Intervention Provide education on lifestyle modifcations including regular physical activity/exercise, weight management, moderate sodium restriction and increased consumption of fresh fruit, vegetables, and low fat dairy, alcohol moderation, and smoking  cessation.;Monitor prescription use compliance.    Expected Outcomes Short Term: Continued assessment and intervention until BP is < 140/43mm HG in hypertensive participants. < 130/84mm HG in hypertensive participants with diabetes, heart failure or chronic kidney disease.;Long Term: Maintenance of blood pressure at goal levels.    Lipids Yes    Intervention Provide education and support for participant on nutrition & aerobic/resistive exercise along with prescribed medications to achieve LDL 70mg , HDL >40mg .    Expected Outcomes Short Term: Participant states understanding of desired cholesterol values and is compliant with medications prescribed. Participant is following exercise prescription and nutrition guidelines.;Long Term: Cholesterol controlled with medications as prescribed, with individualized exercise RX and with personalized nutrition plan. Value goals: LDL < 70mg , HDL > 40 mg.          Tobacco Use Initial Evaluation: Social History   Tobacco Use  Smoking Status Former   Current packs/day: 0.00   Average packs/day: 1.3 packs/day for 36.0 years (45.0 ttl pk-yrs)   Types: Cigarettes   Start date: 04/1975   Quit date: 04/2011   Years since quitting: 12.9  Smokeless Tobacco Never    Exercise Goals and Review:   Copy of goals given to participant.

## 2024-03-31 ENCOUNTER — Encounter (HOSPITAL_COMMUNITY)
Admission: RE | Admit: 2024-03-31 | Discharge: 2024-03-31 | Disposition: A | Discharge status: Home / Self Care | Source: Ambulatory Visit | Attending: Internal Medicine | Admitting: Internal Medicine

## 2024-03-31 DIAGNOSIS — J449 Chronic obstructive pulmonary disease, unspecified: Secondary | ICD-10-CM | POA: Diagnosis not present

## 2024-03-31 LAB — GLUCOSE, CAPILLARY: Glucose-Capillary: 257 mg/dL — ABNORMAL HIGH (ref 70–99)

## 2024-03-31 NOTE — Progress Notes (Signed)
 Daily Session Note  Patient Details  Name: [name] MRN: [id number] Date of Birth: Jun 19, 1956 Referring Provider:   Flowsheet Row PULMONARY REHAB OTHER RESP ORIENTATION from 03/25/2024 in [facility] CARDIAC REHABILITATION  Referring Provider Darlean Sharper MD    Encounter Date: 03/31/2024  Check In:  Session Check In - 03/31/24 1027       Check-In   Supervising physician immediately available to respond to emergencies See telemetry face sheet for immediately available MD    Location [facility]    Staff Present Powell Benders, BS, Exercise Physiologist;Laureen Delores, BS, RRT, CPFT;Elyjah Hazan Jackquline, BSN, RN, WTA-C    Virtual Visit No    Medication changes reported     No    Fall or balance concerns reported    No    Tobacco Cessation No Change    Warm-up and Cool-down Performed on first and last piece of equipment    Resistance Training Performed Yes    VAD Patient? No    PAD/SET Patient? No      Pain Assessment   Currently in Pain? No/denies          Capillary Blood Glucose: Results for orders placed or performed during the hospital encounter of 03/31/24 (from the past 24 hours)  Glucose, capillary     Status: Abnormal   Collection Time: 03/31/24 10:20 AM  Result Value Ref Range   Glucose-Capillary 257 (H) 70 - 99 mg/dL      Social History   Tobacco Use  Smoking Status Former   Current packs/day: 0.00   Average packs/day: 1.3 packs/day for 36.0 years (45.0 ttl pk-yrs)   Types: Cigarettes   Start date: 04/1975   Quit date: 04/2011   Years since quitting: 12.9  Smokeless Tobacco Never    Goals Met:  Proper associated with RPD/PD & O2 Sat Independence with exercise equipment Improved SOB with ADL's Using PLB without cueing & demonstrates good technique Exercise tolerated well No report of concerns or symptoms today Strength training completed today  Goals Unmet:  Not Applicable  Comments: First full day of exercise!  Patient was  oriented to gym and equipment including functions, settings, policies, and procedures.  Patient's individual exercise prescription and treatment plan were reviewed.  All starting workloads were established based on the results of the 6 minute walk test done at initial orientation visit.  The plan for exercise progression was also introduced and progression will be customized based on patient's performance and goals.

## 2024-04-02 ENCOUNTER — Encounter (HOSPITAL_COMMUNITY)
Admission: RE | Admit: 2024-04-02 | Discharge: 2024-04-02 | Disposition: A | Discharge status: Home / Self Care | Source: Ambulatory Visit | Attending: Internal Medicine | Admitting: Internal Medicine

## 2024-04-02 DIAGNOSIS — J449 Chronic obstructive pulmonary disease, unspecified: Secondary | ICD-10-CM | POA: Diagnosis not present

## 2024-04-02 NOTE — Progress Notes (Signed)
 Daily Session Note  Patient Details  Name: [name] MRN: [id number] Date of Birth: [birth year] Referring Provider:   Flowsheet Row PULMONARY REHAB OTHER RESP ORIENTATION from 03/25/2024 in [facility] CARDIAC REHABILITATION  Referring Provider Darlean Sharper MD    Encounter Date: 04/02/2024  Check In:  Session Check In - 04/02/24 1049       Check-In   Supervising physician immediately available to respond to emergencies See telemetry face sheet for immediately available MD    Location [facility]    Staff Present Powell Benders, BS, Exercise Physiologist;Laquinda Moller Vonzell, MA, RCEP, CCRP, CCET;Victoria Zina, RN    Virtual Visit No    Medication changes reported     No    Fall or balance concerns reported    No    Warm-up and Cool-down Performed on first and last piece of equipment    Resistance Training Performed Yes    VAD Patient? No    PAD/SET Patient? No      Pain Assessment   Currently in Pain? No/denies          Capillary Blood Glucose: No results found for this or any previous visit (from the past 24 hours).    Social History   Tobacco Use  Smoking Status Former   Current packs/day: 0.00   Average packs/day: 1.3 packs/day for 36.0 years (45.0 ttl pk-yrs)   Types: Cigarettes   Start date: 04/1975   Quit date: 04/2011   Years since quitting: 12.9  Smokeless Tobacco Never    Goals Met:  Proper associated with RPD/PD & O2 Sat Using PLB without cueing & demonstrates good technique Exercise tolerated well No report of concerns or symptoms today Strength training completed today  Goals Unmet:  Not Applicable  Comments: Pt able to follow exercise prescription today without complaint.  Will continue to monitor for progression.

## 2024-04-07 ENCOUNTER — Encounter (HOSPITAL_COMMUNITY)
Admission: RE | Admit: 2024-04-07 | Discharge: 2024-04-07 | Disposition: A | Discharge status: Home / Self Care | Source: Ambulatory Visit | Attending: Internal Medicine | Admitting: Internal Medicine

## 2024-04-07 DIAGNOSIS — J449 Chronic obstructive pulmonary disease, unspecified: Secondary | ICD-10-CM | POA: Diagnosis not present

## 2024-04-07 NOTE — Progress Notes (Signed)
 Daily Session Note  Patient Details  Name: [name] MRN: [id number] Date of Birth: September 22, 1955 Referring Provider:   Flowsheet Row PULMONARY REHAB OTHER RESP ORIENTATION from 03/25/2024 in [facility] CARDIAC REHABILITATION  Referring Provider Darlean Sharper MD    Encounter Date: 04/07/2024  Check In:  Session Check In - 04/07/24 1046       Check-In   Supervising physician immediately available to respond to emergencies See telemetry face sheet for immediately available MD    Location [facility]    Staff Present Powell Benders, BS, Exercise Physiologist;Brittany Jackquline, BSN, RN, WTA-C    Virtual Visit No    Medication changes reported     No    Fall or balance concerns reported    No    Warm-up and Cool-down Performed on first and last piece of equipment    Resistance Training Performed Yes    VAD Patient? No    PAD/SET Patient? No      Pain Assessment   Currently in Pain? No/denies          Capillary Blood Glucose: No results found for this or any previous visit (from the past 24 hours).    Social History   Tobacco Use  Smoking Status Former   Current packs/day: 0.00   Average packs/day: 1.3 packs/day for 36.0 years (45.0 ttl pk-yrs)   Types: Cigarettes   Start date: 04/1975   Quit date: 04/2011   Years since quitting: 13.0  Smokeless Tobacco Never    Goals Met:  Proper associated with RPD/PD & O2 Sat Independence with exercise equipment Using PLB without cueing & demonstrates good technique Exercise tolerated well No report of concerns or symptoms today Strength training completed today  Goals Unmet:  Not Applicable  Comments: Pt able to follow exercise prescription today without complaint.  Will continue to monitor for progression.

## 2024-04-09 ENCOUNTER — Encounter (HOSPITAL_COMMUNITY)
Admission: RE | Admit: 2024-04-09 | Discharge: 2024-04-09 | Disposition: A | Discharge status: Home / Self Care | Source: Ambulatory Visit | Attending: Internal Medicine | Admitting: Internal Medicine

## 2024-04-09 DIAGNOSIS — J449 Chronic obstructive pulmonary disease, unspecified: Secondary | ICD-10-CM | POA: Insufficient documentation

## 2024-04-09 NOTE — Progress Notes (Signed)
 Daily Session Note  Patient Details  Name: MAJESTY OEHLERT MRN: [id number] Date of Birth: 05/10/1956 Referring Provider:   Flowsheet Row PULMONARY REHAB OTHER RESP ORIENTATION from 03/25/2024 in [facility] CARDIAC REHABILITATION  Referring Provider Darlean Sharper MD    Encounter Date: 04/09/2024  Check In:  Session Check In - 04/09/24 1031       Check-In   Supervising physician immediately available to respond to emergencies See telemetry face sheet for immediately available MD    Location [facility]    Staff Present Powell Benders, BS, Exercise Physiologist;Hillary [place] BSN, RN;Syona Wroblewski [place], MA, RCEP, CCRP, CCET    Virtual Visit No    Medication changes reported     No    Fall or balance concerns reported    No    Warm-up and Cool-down Performed on first and last piece of equipment    Resistance Training Performed Yes    VAD Patient? No    PAD/SET Patient? No      Pain Assessment   Currently in Pain? No/denies          Capillary Blood Glucose: No results found for this or any previous visit (from the past 24 hours).    Social History   Tobacco Use  Smoking Status Former   Current packs/day: 0.00   Average packs/day: 1.3 packs/day for 36.0 years (45.0 ttl pk-yrs)   Types: Cigarettes   Start date: 04/1975   Quit date: 04/2011   Years since quitting: 13.0  Smokeless Tobacco Never    Goals Met:  Proper associated with RPD/PD & O2 Sat Using PLB without cueing & demonstrates good technique Exercise tolerated well No report of concerns or symptoms today Strength training completed today  Goals Unmet:  Not Applicable  Comments: Pt able to follow exercise prescription today without complaint.  Will continue to monitor for progression.

## 2024-04-14 ENCOUNTER — Encounter (HOSPITAL_COMMUNITY)

## 2024-04-15 ENCOUNTER — Encounter (HOSPITAL_COMMUNITY): Payer: Self-pay | Admitting: *Deleted

## 2024-04-15 DIAGNOSIS — J449 Chronic obstructive pulmonary disease, unspecified: Secondary | ICD-10-CM

## 2024-04-15 NOTE — Progress Notes (Signed)
 Pulmonary Individual Treatment Plan  Patient Details  Name: [name] MRN: [id number] Date of Birth: [birth year] Referring Provider:   Flowsheet Row PULMONARY REHAB OTHER RESP ORIENTATION from 03/25/2024 in [facility] CARDIAC REHABILITATION  Referring Provider Darlean Sharper MD    Initial Encounter Date:  Flowsheet Row PULMONARY REHAB OTHER RESP ORIENTATION from 03/25/2024 in [facility] CARDIAC REHABILITATION  Date 03/25/24    Visit Diagnosis: Chronic obstructive pulmonary disease, unspecified COPD type ([name])  Patient's Home Medications on Admission:   Current Outpatient Medications:    acetaminophen  (TYLENOL ) 325 MG tablet, Take 2 tablets (650 mg total) by mouth every 6 (six) hours as needed for mild pain (pain score 1-3) (or Fever >/= 101)., Disp: , Rfl:    albuterol  (PROVENTIL ) (2.5 MG/3ML) 0.083% nebulizer solution, Take 3 mLs (2.5 mg total) by nebulization every 2 (two) hours as needed for wheezing or shortness of breath (cough)., Disp: 75 mL, Rfl: 2   albuterol  (VENTOLIN  HFA) 108 (90 Base) MCG/ACT inhaler, Inhale 2 puffs into the lungs every 4 (four) hours as needed for wheezing or shortness of breath., Disp: 8 g, Rfl: 2   ALPRAZolam  (XANAX ) 0.25 MG tablet, Take 0.25 mg by mouth 2 (two) times daily as needed for sleep or anxiety., Disp: , Rfl:    amoxicillin -clavulanate (AUGMENTIN ) 875-125 MG tablet, Take 1 tablet by mouth 2 (two) times daily., Disp: 20 tablet, Rfl: 0   aspirin  EC 81 MG tablet, Take 81 mg by mouth daily. Swallow whole., Disp: , Rfl:    atorvastatin  (LIPITOR) 40 MG tablet, Take 40 mg by mouth in the morning., Disp: , Rfl:    BREZTRI  AEROSPHERE 160-9-4.8 MCG/ACT AERO, INHALE 2 PUFFS INTO THE LUNGS TWICE A DAY, Disp: 10.7 each, Rfl: 11   Cholecalciferol  (VITAMIN D3) 25 MCG (1000 UT) CAPS, Take 1,000 Units by mouth in the morning., Disp: , Rfl:    diltiazem  (CARDIZEM  CD) 240 MG 24 hr capsule, Take 240 mg by mouth daily., Disp: , Rfl:    EMBECTA PEN NEEDLE ULTRAFINE  31G X 5 MM MISC, , Disp: , Rfl:    famotidine  (PEPCID ) 20 MG tablet, TAKE 1 TABLET BY MOUTH EVERY DAY AFTER SUPPER, Disp: 90 tablet, Rfl: 0   fluticasone  (FLONASE ) 50 MCG/ACT nasal spray, Place 1 spray into both nostrils daily as needed for allergies or rhinitis., Disp: , Rfl:    furosemide  (LASIX ) 40 MG tablet, Take 1 tablet (40 mg total) by mouth daily in the morning and take 1/2 tablet (20 mg total) by mouth daily in the afternoon., Disp: 135 tablet, Rfl: 1   gabapentin  (NEURONTIN ) 300 MG capsule, Take 1 capsule (300 mg total) by mouth at bedtime., Disp: 30 capsule, Rfl: 5   glimepiride  (AMARYL ) 1 MG tablet, Take 1 mg by mouth daily with breakfast., Disp: , Rfl:    insulin  glargine, 2 Unit Dial, (TOUJEO  MAX) 300 UNIT/ML Solostar Pen, Inject into the skin., Disp: , Rfl:    metFORMIN  (GLUCOPHAGE ) 1000 MG tablet, Take 1,000 mg by mouth daily with supper., Disp: , Rfl:    Multiple Vitamin (MULTIVITAMIN WITH MINERALS) TABS tablet, Take 1 tablet by mouth at bedtime., Disp: 30 tablet, Rfl: 2   OXYGEN , Inhale 4 L into the lungs continuous., Disp: , Rfl:    pantoprazole  (PROTONIX ) 40 MG tablet, TAKE 1 TABLET (40 MG TOTAL) BY MOUTH IN THE MORNING, Disp: 90 tablet, Rfl: 1   pioglitazone (ACTOS) 15 MG tablet, Take 15 mg by mouth daily., Disp: , Rfl:  pyridOXINE (B-6) 50 MG tablet, Take 50 mg by mouth daily., Disp: , Rfl:    sertraline  (ZOLOFT ) 50 MG tablet, Take 1 tablet (50 mg total) by mouth daily., Disp: 30 tablet, Rfl: 3   tamsulosin  (FLOMAX ) 0.4 MG CAPS capsule, Take 0.4 mg by mouth in the morning., Disp: , Rfl:    vitamin C  (ASCORBIC ACID ) 250 MG tablet, Take 250 mg by mouth in the morning., Disp: , Rfl:    Wheat Dextrin (BENEFIBER PO), Take 1 Capful by mouth daily., Disp: , Rfl:   Past Medical History: Past Medical History:  Diagnosis Date   Anxiety    Arthritis    Asthma    Chest pain    a. 2003 Cath: nl cors.   Cirrhosis ([name])    secondary to MASH   Concussion    COPD (chronic  obstructive pulmonary disease) ([name])    chronically on 3-4L [place]   Depression    Diabetes mellitus    Dyslipidemia    Tachycardia   Dyspnea    Dysrhythmia    Fracture 06/2014   lower back L-1   GERD (gastroesophageal reflux disease)    Hypertension    NASH (nonalcoholic steatohepatitis) 2018   [name], undergoing routine cirrhosis care; [name]   Pneumonia 12/01/2021   PONV (postoperative nausea and vomiting)    Splenomegaly    Thrombocytopenia     Tobacco Use: Social History   Tobacco Use  Smoking Status Former   Current packs/day: 0.00   Average packs/day: 1.3 packs/day for 36.0 years (45.0 ttl pk-yrs)   Types: Cigarettes   Start date: 04/1975   Quit date: 04/2011   Years since quitting: 13.0  Smokeless Tobacco Never    Labs: Review Flowsheet  More data exists      Latest Ref Rng & Units 05/23/2020 08/18/2020 11/27/2021 06/08/2023 06/09/2023  Labs for ITP Cardiac and Pulmonary Rehab  Hemoglobin A1c 4.8 - 5.6 % 7.9  7.3  6.8  - 7.2   PH, Arterial 7.350 - 7.450 - 7.292  - - -  PCO2 arterial 32.0 - 48.0 mmHg - 42.4  - - -  Bicarbonate 20.0 - 28.0 mmol/L - 19.4  - 31.2  -  Acid-base deficit 0.0 - 2.0 mmol/L - 5.6  - - -  O2 Saturation % - 91.2  - 79.2  -    Capillary Blood Glucose: Lab Results  Component Value Date   GLUCAP 257 (H) 03/31/2024   GLUCAP 240 (H) 08/10/2023   GLUCAP 252 (H) 08/10/2023   GLUCAP 316 (H) 08/10/2023   GLUCAP 356 (H) 08/09/2023     Pulmonary Assessment Scores:  Pulmonary Assessment Scores     Row Name 03/25/24 0934         ADL UCSD   ADL Phase Entry     SOB Score total 56     Rest 1     Walk 2     Stairs 4     Bath 3     Dress 2     Shop 2       CAT Score   CAT Score 25       mMRC Score   mMRC Score 3       UCSD: Self-administered rating of dyspnea associated with activities of daily living (ADLs) 6-point scale (0 = not at all to 5 = maximal or unable to do because  of breathlessness)  Scoring Scores range from 0 to  120.  Minimally important difference is 5 units  CAT: CAT can identify the health impairment of COPD patients and is better correlated with disease progression.  CAT has a scoring range of zero to 40. The CAT score is classified into four groups of low (less than 10), medium (10 - 20), high (21-30) and very high (31-40) based on the impact level of disease on health status. A CAT score over 10 suggests significant symptoms.  A worsening CAT score could be explained by an exacerbation, poor medication adherence, poor inhaler technique, or progression of COPD or comorbid conditions.  CAT MCID is 2 points  mMRC: mMRC (Modified Medical Research Council) Dyspnea Scale is used to assess the degree of baseline functional disability in patients of respiratory disease due to dyspnea. No minimal important difference is established. A decrease in score of 1 point or greater is considered a positive change.   Pulmonary Function Assessment:   Exercise Target Goals: Exercise Program Goal: Individual exercise prescription set using results from initial 6 min walk test and THRR while considering  patient's activity barriers and safety.   Exercise Prescription Goal: Initial exercise prescription builds to 30-45 minutes a day of aerobic activity, 2-3 days per week.  Home exercise guidelines will be given to patient during program as part of exercise prescription that the participant will acknowledge.  Activity Barriers & Risk Stratification:  Activity Barriers & Cardiac Risk Stratification - 03/23/24 1314       Activity Barriers & Cardiac Risk Stratification   Activity Barriers Back Problems;Arthritis;Shortness of Breath;History of Falls   OA right hip.         6 Minute Walk:  6 Minute Walk     Row Name 03/25/24 0941         6 Minute Walk   Phase Initial     Distance 800 feet     Walk Time 6 minutes     # of Rest Breaks 0     MPH 1.51      METS 2.25     RPE 13     Perceived Dyspnea  1     VO2 Peak 7.88     Symptoms No     Resting HR 103 bpm     Resting BP 106/60     Resting Oxygen  Saturation  95 %     Exercise Oxygen  Saturation  during 6 min walk 88 %     Max Ex. HR 125 bpm     Max Ex. BP 126/62     2 Minute Post BP 106/62       Interval HR   1 Minute HR 116     2 Minute HR 119     3 Minute HR 120     4 Minute HR 122     5 Minute HR 123     6 Minute HR 125     2 Minute Post HR 97     Interval Heart Rate? Yes       Interval Oxygen    Interval Oxygen ? Yes     Baseline Oxygen  Saturation % 95 %     1 Minute Oxygen  Saturation % 93 %     1 Minute Liters of Oxygen  3 L     2 Minute Oxygen  Saturation % 90 %     2 Minute Liters of Oxygen  3 L     3 Minute Oxygen  Saturation % 88 %     3 Minute Liters of Oxygen  3  L     4 Minute Oxygen  Saturation % 91 %     4 Minute Liters of Oxygen  3 L     5 Minute Oxygen  Saturation % 91 %     5 Minute Liters of Oxygen  3 L     6 Minute Oxygen  Saturation % 93 %     6 Minute Liters of Oxygen  3 L     2 Minute Post Oxygen  Saturation % 97 %     2 Minute Post Liters of Oxygen  3 L        Oxygen  Initial Assessment:  Oxygen  Initial Assessment - 03/25/24 0944       Initial 6 min Walk   Oxygen  Used Continuous    Liters per minute 3      Program Oxygen  Prescription   Program Oxygen  Prescription Continuous    Liters per minute 3          Oxygen  Re-Evaluation:   Oxygen  Discharge (Final Oxygen  Re-Evaluation):   Initial Exercise Prescription:  Initial Exercise Prescription - 03/25/24 0900       Date of Initial Exercise RX and Referring Provider   Date 03/25/24    Referring Provider Darlean Sharper MD      Oxygen    Oxygen  Continuous    Liters 3    Maintain Oxygen  Saturation 88% or higher      Prescription Details   Frequency (times per week) 2    Duration Progress to 30 minutes of continuous aerobic without signs/symptoms of physical distress      Intensity   THRR  40-80% of Max Heartrate 119-141    Ratings of Perceived Exertion 11-13    Perceived Dyspnea 0-4      Resistance Training   Training Prescription Yes    Weight 3    Reps 10-15          Perform Capillary Blood Glucose checks as needed.  Exercise Prescription Changes:   Exercise Prescription Changes     Row Name 03/25/24 0900 03/31/24 1500           Response to Exercise   Blood Pressure (Admit) 106/60 100/60      Blood Pressure (Exercise) 126/62 132/60      Blood Pressure (Exit) 106/62 102/60      Heart Rate (Admit) 103 bpm 108 bpm      Heart Rate (Exercise) 125 bpm 115 bpm      Heart Rate (Exit) 97 bpm 104 bpm      Oxygen  Saturation (Admit) 95 % 92 %      Oxygen  Saturation (Exercise) 88 % 91 %      Oxygen  Saturation (Exit) 97 % 95 %      Rating of Perceived Exertion (Exercise) 13 11      Perceived Dyspnea (Exercise) 1 0      Duration -- Continue with 30 min of aerobic exercise without signs/symptoms of physical distress.      Intensity -- THRR unchanged        Progression   Progression -- Continue to progress workloads to maintain intensity without signs/symptoms of physical distress.        Resistance Training   Training Prescription -- Yes      Weight -- 3      Reps -- 10-15        Oxygen    Oxygen  -- Continuous      Liters -- 3        NuStep   Level -- 1  SPM -- 56      Minutes -- 15      METs -- 1.9        Arm Ergometer   Level -- 1      Watts -- 30      Minutes -- 15      METs -- 1.7        Oxygen    Maintain Oxygen  Saturation -- 88% or higher         Exercise Comments:   Exercise Comments     Row Name 03/31/24 1028           Exercise Comments First full day of exercise!  Patient was oriented to gym and equipment including functions, settings, policies, and procedures.  Patient's individual exercise prescription and treatment plan were reviewed.  All starting workloads were established based on the results of the 6 minute walk test  done at initial orientation visit.  The plan for exercise progression was also introduced and progression will be customized based on patient's performance and goals.          Exercise Goals and Review:   Exercise Goals Re-Evaluation :  Exercise Goals Re-Evaluation     Row Name 04/07/24 0827             Exercise Goal Re-Evaluation   Exercise Goals Review Increase Physical Activity;Increase Strength and Stamina;Understanding of Exercise Prescription       Comments Joe has just started PR. HE did well on his first day getting use to all the equipment and the hand weights. Will continue to montior and progress as able.       Expected Outcomes Short: increase SPM and RPM    long: continue to attend rehab          Discharge Exercise Prescription (Final Exercise Prescription Changes):  Exercise Prescription Changes - 03/31/24 1500       Response to Exercise   Blood Pressure (Admit) 100/60    Blood Pressure (Exercise) 132/60    Blood Pressure (Exit) 102/60    Heart Rate (Admit) 108 bpm    Heart Rate (Exercise) 115 bpm    Heart Rate (Exit) 104 bpm    Oxygen  Saturation (Admit) 92 %    Oxygen  Saturation (Exercise) 91 %    Oxygen  Saturation (Exit) 95 %    Rating of Perceived Exertion (Exercise) 11    Perceived Dyspnea (Exercise) 0    Duration Continue with 30 min of aerobic exercise without signs/symptoms of physical distress.    Intensity THRR unchanged      Progression   Progression Continue to progress workloads to maintain intensity without signs/symptoms of physical distress.      Resistance Training   Training Prescription Yes    Weight 3    Reps 10-15      Oxygen    Oxygen  Continuous    Liters 3      NuStep   Level 1    SPM 56    Minutes 15    METs 1.9      Arm Ergometer   Level 1    Watts 30    Minutes 15    METs 1.7      Oxygen    Maintain Oxygen  Saturation 88% or higher          Nutrition:  Target Goals: Understanding of nutrition guidelines,  daily intake of sodium 1500mg , cholesterol 200mg , calories 30% from fat and 7% or less from saturated fats, daily to have 5 or more  servings of fruits and vegetables.  Biometrics:  Pre Biometrics - 03/25/24 0943       Pre Biometrics   Height 5' 8 (1.727 m)    Weight 170 lb 3.1 oz (77.2 kg)    Waist Circumference 46 inches    Hip Circumference 39 inches    Waist to Hip Ratio 1.18 %    BMI (Calculated) 25.88    Grip Strength 27.2 kg    Single Leg Stand 0 seconds           Nutrition Therapy Plan and Nutrition Goals:   Nutrition Assessments:  MEDIFICTS Score Key: >=70 Need to make dietary changes  40-70 Heart Healthy Diet <= 40 Therapeutic Level Cholesterol Diet  Flowsheet Row PULMONARY REHAB OTHER RESP ORIENTATION from 03/25/2024 in [facility] CARDIAC RE[facility]ILITATION  Picture Your Plate Total Score on Admission 65   Picture Your Plate Scores: <59 Unhealthy dietary pattern with much room for improvement. 41-50 Dietary pattern unlikely to meet recommendations for good health and room for improvement. 51-60 More healthful dietary pattern, with some room for improvement.  >60 Healthy dietary pattern, although there may be some specific behaviors that could be improved.    Nutrition Goals Re-Evaluation:   Nutrition Goals Discharge (Final Nutrition Goals Re-Evaluation):   Psychosocial: Target Goals: Acknowledge presence or absence of significant depression and/or stress, maximize coping skills, provide positive support system. Participant is able to verbalize types and ability to use techniques and skills needed for reducing stress and depression.  Initial Review & Psychosocial Screening:  Initial Psych Review & Screening - 03/23/24 1331       Initial Review   Current issues with History of Depression;Current Psychotropic Meds;Current Sleep Concerns      Family Dynamics   Good Support System? Yes    Comments Patient wife and daughter support him.      Barriers    Psychosocial barriers to participate in program The patient should benefit from training in stress management and relaxation.      Screening Interventions   Interventions To provide support and resources with identified psychosocial needs;Provide feedback about the scores to participant;Encouraged to exercise    Expected Outcomes Short Term goal: Utilizing psychosocial counselor, staff and physician to assist with identification of specific Stressors or current issues interfering with healing process. Setting desired goal for each stressor or current issue identified.;Long Term Goal: Stressors or current issues are controlled or eliminated.;Short Term goal: Identification and review with participant of any Quality of Life or Depression concerns found by scoring the questionnaire.;Long Term goal: The participant improves quality of Life and PHQ9 Scores as seen by post scores and/or verbalization of changes          Quality of Life Scores:  Scores of 19 and below usually indicate a poorer quality of life in these areas.  A difference of  2-3 points is a clinically meaningful difference.  A difference of 2-3 points in the total score of the Quality of Life Index has been associated with significant improvement in overall quality of life, self-image, physical symptoms, and general health in studies assessing change in quality of life.   PHQ-9: Review Flowsheet       03/25/2024 08/16/2020 01/15/2018 12/18/2017  Depression screen PHQ 2/9  Decreased Interest 2 0 0 0  Down, Depressed, Hopeless 1 0 0 0  PHQ - 2 Score 3 0 0 0  Altered sleeping 1 - - -  Tired, decreased energy 1 - - -  Change in  appetite 1 - - -  Feeling bad or failure about yourself  1 - - -  Trouble concentrating 1 - - -  Moving slowly or fidgety/restless 1 - - -  Suicidal thoughts 0 - - -  PHQ-9 Score 9 - - -  Difficult doing work/chores Somewhat difficult - - -   Interpretation of Total Score  Total Score Depression Severity:   1-4 = Minimal depression, 5-9 = Mild depression, 10-14 = Moderate depression, 15-19 = Moderately severe depression, 20-27 = Severe depression   Psychosocial Evaluation and Intervention:  Psychosocial Evaluation - 03/23/24 1332       Psychosocial Evaluation & Interventions   Interventions Stress management education;Relaxation education;Encouraged to exercise with the program and follow exercise prescription    Comments Patient was referred to pulmonary rehab with COPD. He says he does have some depression/anxiety managed with Zoloft  and Alprazolam . He lives with his wife who is his main support person along with his daughter when needed. He says he has trouble staying asleep some nights. His goals for the program are to improve his mobility and breathing. 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 He has no barriers identified to complete the program.    Expected Outcomes Short Term: Patient will start the program and attend consistently. Long Term: Patient will complete the program meeting personal goals.    Continue Psychosocial Services  Follow up required by staff          Psychosocial Re-Evaluation:   Psychosocial Discharge (Final Psychosocial Re-Evaluation):    Education: Education Goals: Education classes will be provided on a weekly basis, covering required topics. Participant will state understanding/return demonstration of topics presented.  Learning Barriers/Preferences:  Learning Barriers/Preferences - 03/23/24 1330       Learning Barriers/Preferences   Learning Barriers None    Learning Preferences Skilled Demonstration;Pictoral          Education Topics: How Lungs Work and Diseases: - Discuss the anatomy of the lungs and diseases that can affect the lungs, such as COPD.   Exercise: -Discuss the importance of exercise, FITT principles of exercise, normal and abnormal responses to exercise,  and how to exercise safely.   Environmental Irritants: -Discuss types of environmental irritants and how to limit exposure to environmental irritants.   Meds/Inhalers and oxygen : - Discuss respiratory medications, definition of an inhaler and oxygen , and the proper way to use an inhaler and oxygen .   Energy Saving Techniques: - Discuss methods to conserve energy and decrease shortness of breath when performing activities of daily living.    Bronchial Hygiene / Breathing Techniques: - Discuss breathing mechanics, pursed-lip breathing technique,  proper posture, effective ways to clear airways, and other functional breathing techniques   Cleaning Equipment: - Provides group verbal and written instruction about the health risks of elevated stress, cause of high stress, and healthy ways to reduce stress.   Nutrition I: Fats: - Discuss the types of cholesterol, what cholesterol does to the body, and how cholesterol levels can be controlled.   Nutrition II: Labels: -Discuss the different components of food labels and how to read food labels.   Respiratory Infections: - Discuss the signs and symptoms of respiratory infections, ways to prevent respiratory infections, and the importance of seeking medical treatment when having a respiratory infection.   Stress I: Signs and Symptoms: - Discuss the causes of stress, how stress may  lead to anxiety and depression, and ways to limit stress.   Stress II: Relaxation: -Discuss relaxation techniques to limit stress.   Oxygen  for Home/Travel: - Discuss how to prepare for travel when on oxygen  and proper ways to transport and store oxygen  to ensure safety.   Knowledge Questionnaire Score:  Knowledge Questionnaire Score - 03/25/24 0951       Knowledge Questionnaire Score   Pre Score 15/18          Core Components/Risk Factors/Patient Goals at Admission:  Personal Goals and Risk Factors at Admission - 03/23/24 1330       Core  Components/Risk Factors/Patient Goals on Admission    Weight Management Weight Maintenance    Improve shortness of breath with ADL's Yes    Intervention Provide education, individualized exercise plan and daily activity instruction to help decrease symptoms of SOB with activities of daily living.    Expected Outcomes Short Term: Improve cardiorespiratory fitness to achieve a reduction of symptoms when performing ADLs;Long Term: Be able to perform more ADLs without symptoms or delay the onset of symptoms    Increase knowledge of respiratory medications and ability to use respiratory devices properly  Yes    Intervention Provide education and demonstration as needed of appropriate use of medications, inhalers, and oxygen  therapy.    Expected Outcomes Short Term: Achieves understanding of medications use. Understands that oxygen  is a medication prescribed by physician. Demonstrates appropriate use of inhaler and oxygen  therapy.;Long Term: Maintain appropriate use of medications, inhalers, and oxygen  therapy.    Diabetes Yes    Intervention Provide education about signs/symptoms and action to take for hypo/hyperglycemia.;Provide education about proper nutrition, including hydration, and aerobic/resistive exercise prescription along with prescribed medications to achieve blood glucose in normal ranges: Fasting glucose 65-99 mg/dL    Expected Outcomes Short Term: Participant verbalizes understanding of the signs/symptoms and immediate care of hyper/hypoglycemia, proper foot care and importance of medication, aerobic/resistive exercise and nutrition plan for blood glucose control.;Long Term: Attainment of HbA1C < 7%.    Hypertension Yes    Intervention Provide education on lifestyle modifcations including regular physical activity/exercise, weight management, moderate sodium restriction and increased consumption of fresh fruit, vegetables, and low fat dairy, alcohol moderation, and smoking cessation.;Monitor  prescription use compliance.    Expected Outcomes Short Term: Continued assessment and intervention until BP is < 140/61mm HG in hypertensive participants. < 130/63mm HG in hypertensive participants with diabetes, heart failure or chronic kidney disease.;Long Term: Maintenance of blood pressure at goal levels.    Lipids Yes    Intervention Provide education and support for participant on nutrition & aerobic/resistive exercise along with prescribed medications to achieve LDL 70mg , HDL >40mg .    Expected Outcomes Short Term: Participant states understanding of desired cholesterol values and is compliant with medications prescribed. Participant is following exercise prescription and nutrition guidelines.;Long Term: Cholesterol controlled with medications as prescribed, with individualized exercise RX and with personalized nutrition plan. Value goals: LDL < 70mg , HDL > 40 mg.          Core Components/Risk Factors/Patient Goals Review:    Core Components/Risk Factors/Patient Goals at Discharge (Final Review):    ITP Comments:  ITP Comments     Row Name 03/23/24 1338 03/25/24 0955 03/31/24 1028 04/15/24 1532     ITP Comments Virtual orientation visit completed for pulmonary rehab with COPD. On-site orientation visit scheduled for 03/25/24 at 8:30. Patient arrived for 1st visit/orientation/education at 0830. Patient was referred to PR by Dr. Darlean due  to COPD. During orientation advised patient on arrival and appointment times what to wear, what to do before, during and after exercise. Reviewed attendance and class policy.  Pt is scheduled to return Pulmonary Rehab on 03/31/24 at 10:30. Pt was advised to come to class 15 minutes before class starts.  Discussed RPE/Dpysnea scales. Patient participated in warm up stretches. Patient was able to complete 6 minute walk test. Patient was measured for the equipment. Discussed equipment safety with patient. Took patient pre-anthropometric measurements. Patient  finished visit at 950. First full day of exercise!  Patient was oriented to gym and equipment including functions, settings, policies, and procedures.  Patient's individual exercise prescription and treatment plan were reviewed.  All starting workloads were established based on the results of the 6 minute walk test done at initial orientation visit.  The plan for exercise progression was also introduced and progression will be customized based on patient's performance and goals. 30 day review completed. ITP sent to Dr.Jehanzeb Memon, Medical Director of  Pulmonary Rehab. Continue with ITP unless changes are made by physician.    Newer to program       Comments: 30 day review

## 2024-04-16 ENCOUNTER — Encounter (HOSPITAL_COMMUNITY)

## 2024-04-21 ENCOUNTER — Inpatient Hospital Stay: Attending: Physician Assistant

## 2024-04-21 ENCOUNTER — Encounter (HOSPITAL_COMMUNITY)

## 2024-04-21 ENCOUNTER — Telehealth: Payer: Self-pay

## 2024-04-21 DIAGNOSIS — R918 Other nonspecific abnormal finding of lung field: Secondary | ICD-10-CM | POA: Diagnosis not present

## 2024-04-21 DIAGNOSIS — E611 Iron deficiency: Secondary | ICD-10-CM | POA: Diagnosis not present

## 2024-04-21 DIAGNOSIS — D696 Thrombocytopenia, unspecified: Secondary | ICD-10-CM | POA: Insufficient documentation

## 2024-04-21 DIAGNOSIS — Z7982 Long term (current) use of aspirin: Secondary | ICD-10-CM | POA: Diagnosis not present

## 2024-04-21 DIAGNOSIS — Z87891 Personal history of nicotine dependence: Secondary | ICD-10-CM | POA: Insufficient documentation

## 2024-04-21 LAB — COMPREHENSIVE METABOLIC PANEL WITH GFR
ALT: 31 U/L (ref 0–44)
AST: 15 U/L (ref 15–41)
Albumin: 4.1 g/dL (ref 3.5–5.0)
Alkaline Phosphatase: 124 U/L (ref 38–126)
Anion gap: 8 (ref 5–15)
BUN: 25 mg/dL — ABNORMAL HIGH (ref 8–23)
CO2: 33 mmol/L — ABNORMAL HIGH (ref 22–32)
Calcium: 9.9 mg/dL (ref 8.9–10.3)
Chloride: 100 mmol/L (ref 98–111)
Creatinine, Ser: 0.89 mg/dL (ref 0.61–1.24)
GFR, Estimated: >60 mL/min (ref >60)
Glucose, Bld: 145 mg/dL — ABNORMAL HIGH (ref 70–99)
Potassium: 3.8 mmol/L (ref 3.5–5.1)
Sodium: 140 mmol/L (ref 135–145)
Total Bilirubin: 0.4 mg/dL (ref 0.0–1.2)
Total Protein: 8.1 g/dL (ref 6.5–8.1)

## 2024-04-21 LAB — CBC WITH DIFFERENTIAL/PLATELET
Abs Immature Granulocytes: 1.1 K/uL — ABNORMAL HIGH (ref 0.00–0.07)
Basophils Absolute: 0 K/uL (ref 0.0–0.1)
Basophils Relative: 0 %
Eosinophils Absolute: 0 K/uL (ref 0.0–0.5)
Eosinophils Relative: 0 %
HCT: 44.4 % (ref 39.0–52.0)
Hemoglobin: 14.3 g/dL (ref 13.0–17.0)
Lymphocytes Relative: 18 %
Lymphs Abs: 2.4 K/uL (ref 0.7–4.0)
MCH: 26.8 pg (ref 26.0–34.0)
MCHC: 32.2 g/dL (ref 30.0–36.0)
MCV: 83.3 fL (ref 80.0–100.0)
Metamyelocytes Relative: 2 %
Monocytes Absolute: 0 K/uL — ABNORMAL LOW (ref 0.1–1.0)
Monocytes Relative: 0 %
Myelocytes: 5 %
Neutro Abs: 9.8 K/uL — ABNORMAL HIGH (ref 1.7–7.7)
Neutrophils Relative %: 74 %
Platelets: 196 K/uL (ref 150–400)
Promyelocytes Relative: 1 %
RBC: 5.33 MIL/uL (ref 4.22–5.81)
RDW: 14.6 % (ref 11.5–15.5)
Smear Review: NORMAL
WBC: 13.2 K/uL — ABNORMAL HIGH (ref 4.0–10.5)
nRBC: 0.3 % — ABNORMAL HIGH (ref 0.0–0.2)

## 2024-04-21 LAB — LACTATE DEHYDROGENASE: LDH: 208 U/L — ABNORMAL HIGH (ref 98–192)

## 2024-04-21 LAB — IMMATURE PLATELET FRACTION: Immature Platelet Fraction: 2.8 % (ref 1.2–8.6)

## 2024-04-21 NOTE — Telephone Encounter (Signed)
 Received fax from [facility] that pt's PAP application has not been completed. Confirmed with Dr. Darlean via secure chat that he still wants the pt to try the medication. Reached out to [facility] to confirm and they stated there are several components missing that the patient would have to complete. He can submit a hard copy of the paperwork or complete online through a link they sent. LVM with pt to advised him to reach out to [facility].

## 2024-04-23 ENCOUNTER — Encounter (HOSPITAL_COMMUNITY)

## 2024-04-24 ENCOUNTER — Ambulatory Visit (INDEPENDENT_AMBULATORY_CARE_PROVIDER_SITE_OTHER): Admitting: Otolaryngology

## 2024-04-24 NOTE — Telephone Encounter (Signed)
 Received fax from [facility] confirming receipt of prescription.  The pharmacy will be reaching out to the patient to coordinate shipment.   Aleck Puls, PharmD, BCPS, CPP Clinical Pharmacist  [facility]

## 2024-04-27 NOTE — Progress Notes (Unsigned)
 [facility] [address], [place] [address]   CLINIC:  Medical Oncology/Hematology  PCP:  Sheryle Carwin, MD [address] [place] [address] [phone]   REASON FOR VISIT:  Follow-up for thrombocytopenia  CURRENT THERAPY: Surveillance  INTERVAL HISTORY:   [name] 68 y.o. male returns for routine follow-up of thrombocytopenia. He was last seen by Christopher Barefoot PA-C on 11/24/2021, but lost to follow-up since that time. After being seen by GI provider in August 2025, he wished to reestablish care with [facility]/hematology clinic.  At today's visit, he reports feeling fair apart from some ongoing fatigue.   No recent hospitalizations, surgeries, or changes in baseline health status. He has 25% energy and 100% appetite.  He endorses that he is maintaining a stable weight.  He reports intermittent epistaxis related to dry nasal cavity from supplemental oxygen  (COPD) via nasal cannula. No other bleeding episodes such as hematuria, bleeding gums, hematemesis, hematochezia, or melena. He admits to easy bruising but denies any petechial rash. He denies any B symptoms such as fever, chills, or unintentional weight loss.  He reports chronic fatigue, which he feels is slightly worse than usual.  He tires easily. Started on iron supplement (ferrous sulfate  325 mg daily) after his visit in May 2023.    ASSESSMENT & PLAN:  1.  Mild to moderate thrombocytopenia: - He has thrombocytopenia since 2010.  White count and hemoglobin are normal. - Normal B12 and folate.  SPEP negative for M spike. - PET scan on 08/26/2017 for follow-up lung nodule showed fatty liver with normal spleen. - Liver biopsy on 02/21/2017 showed moderate to severe steatosis. - US  abdomen RUQ from 01/20/2021 showed echogenic liver consistent with steatosis. No focal lesions or acute findings. Previously, imaging from 07/19/20 showed splenomegaly with spleen measuring 15.2 cm with  volume of 702.7cc. - US  abdomen (11/25/2023): Cirrhotic liver morphology.  Mild splenomegaly. - Blood smear previously noted normal appearing platelets.  - Mild bruising.  Intermittent epistaxis.  No major bleeding events.   - Most recent labs (04/21/2024): **Current steroids with prednisone  20 mg daily for COPD.  Also takes Breztri  inhaler daily. Platelets 196 (usual baseline has been platelets around 80-110) WBC 13.2/ANC 9.8. Immature platelet fraction 2.8% (low-normal) Elevated LDH 208. CMP at baseline. - DIFFERENTIAL DIAGNOSIS favors thrombocytopenia secondary to cirrhosis and splenomegaly.  Have not excluded ITP or MDS at this time. - PLAN: Platelets remain essentially stable at baseline, and in keeping with expected platelet levels for liver cirrhosis with mild splenomegaly. - Would consider possible bone marrow biopsy if development of other cytopenias. - RTC in 1 year.  2.  Lung nodules with history of tobacco use - Patient smoked 30 years, at least 1-1/2 packs/day.  He quit in October 2012. - PLAN: Continue annual LDCT chest via [place] [facility] (NP Lauraine Lites)   3.  Iron deficiency without anemia - Labs from May 2023 showed low ferritin 25, iron saturation 14% - Taking iron tablet daily since May 2023 - Most recent iron panel (02/14/2024) with ferritin 132, iron saturation 38% - PLAN: Continue iron tablet every other day.  Recheck iron panel with next visit.    PLAN SUMMARY: >> Labs in 1 year = CBC/D, CMP, B12, MMA, copper, ferritin, iron/TIBC, folate >> OFFICE visit in 1 year (1 week after labs)     REVIEW OF SYSTEMS:   Review of Systems  Constitutional:  Positive for fatigue. Negative for appetite change, chills, diaphoresis, fever and  unexpected weight change.  HENT:   Negative for lump/mass and nosebleeds.   Eyes:  Negative for eye problems.  Respiratory:  Positive for cough and shortness of breath. Negative for hemoptysis.   Cardiovascular:  Negative for chest  pain, leg swelling and palpitations.  Gastrointestinal:  Negative for abdominal pain, blood in stool, constipation, diarrhea, nausea and vomiting.  Genitourinary:  Negative for hematuria.   Skin: Negative.   Neurological:  Positive for headaches. Negative for dizziness and light-headedness.  Hematological:  Does not bruise/bleed easily.     PHYSICAL EXAM:  ECOG PERFORMANCE STATUS: 1 - Symptomatic but completely ambulatory  Vitals:   04/28/24 0809  BP: 117/66  Pulse: 95  Resp: (!) 24  Temp: 98.1 F (36.7 C)  SpO2: 96%   Filed Weights   04/28/24 0803 04/28/24 0809  Weight: 166 lb 14.2 oz (75.7 kg) 166 lb 14.2 oz (75.7 kg)   Physical Exam Constitutional:      Appearance: Normal appearance. He is obese.  Cardiovascular:     Heart sounds: Normal heart sounds.  Pulmonary:     Breath sounds: Normal breath sounds. Decreased air movement present.  Neurological:     General: No focal deficit present.     Mental Status: Mental status is at baseline.  Psychiatric:        Behavior: Behavior normal. Behavior is cooperative.     PAST MEDICAL/SURGICAL HISTORY:  Past Medical History:  Diagnosis Date   Anxiety    Arthritis    Asthma    Chest pain    a. 2003 Cath: nl cors.   Cirrhosis ([name])    secondary to MASH   Concussion    COPD (chronic obstructive pulmonary disease) ([name])    chronically on 3-4L [place]   Depression    Diabetes mellitus    Dyslipidemia    Tachycardia   Dyspnea    Dysrhythmia    Fracture 06/2014   lower back L-1   GERD (gastroesophageal reflux disease)    Hypertension    NASH (nonalcoholic steatohepatitis) 2018   [name], undergoing routine cirrhosis care; [name]   Pneumonia 12/01/2021   PONV (postoperative nausea and vomiting)    Splenomegaly    Thrombocytopenia    Past Surgical History:  Procedure Laterality Date   BIOPSY  07/26/2016   Procedure: BIOPSY;  Surgeon: Lamar CHRISTELLA Hollingshead, MD;  Location: [facility]  END[facility] SU[facility];  Service: Endoscopy;;  gastric   CARDIAC CATHETERIZATION  10/22/2001   normal coronary arteriea (Dr. MICAEL Ona)   COLONOSCOPY  08/31/2011   MFM:Floupeoz rectal and colon polyps-treated. Single tubular adenoma. Next TCS 08/2016   COLONOSCOPY N/A 07/26/2016   Dr. Hollingshead: diverticulosis in sigmoid and descending colon. 6 mm benign splenic flexure. surveillance 5 yeras   COLONOSCOPY WITH PROPOFOL  N/A 08/07/2021   Surgeon: Cindie Dunnings K, DO;  Nonbleeding internal hemorrhoids, three 3-6 mm polyps in the transverse colon resected and retrieved, otherwise normal exam.  Pathology with 1 tubular adenoma, otherwise benign colonic mucosa.  Recommended 5-year repeat.   ESOPHAGOGASTRODUODENOSCOPY N/A 07/26/2016   Dr. Hollingshead: normal esophagus, portal gastropathy, chronic gastritis, normal duodenum, screening EGD 2 years    ESOPHAGOGASTRODUODENOSCOPY (EGD) WITH ESOPHAGEAL DILATION N/A 05/15/2013   MFM:Wnmfjo esophagus-status post Agapito dilation/Portal gastropathy. Antral erosions-status post biopsy. extrinsic compression along lesser curvature likely secondary to splenomegaly. gastric bx benign   ESOPHAGOGASTRODUODENOSCOPY (EGD) WITH PROPOFOL  N/A 12/12/2020   Surgeon: Hollingshead Lamar CHRISTELLA, MD; ormal esophagus s/p dilation, portal hypertensive gastropathy.  Due for repeat in 2024.   ESOPHAGOGASTRODUODENOSCOPY (EGD) WITH PROPOFOL  N/A 05/22/2023   Procedure: ESOPHAGOGASTRODUODENOSCOPY (EGD) WITH PROPOFOL ;  Surgeon: Shaaron Lamar HERO, MD;  [facility]: [facility];  Service: Endoscopy;  Laterality: N/A;  9:30 am, asa 3   ETHMOIDECTOMY Bilateral 03/02/2022   Procedure: ETHMOIDECTOMY;  Surgeon: Karis Clunes, MD;  Location[facility] OR;  Service: ENT;  Laterality: Bilateral;   HERNIA REPAIR  03/2009   MALONEY DILATION N/A 12/12/2020   Procedure: AGAPITO DILATION;  Surgeon: Shaaron Lamar HERO, MD;  [facility];  Service: Endoscopy;  Laterality: N/A;   MAXILLARY ANTROSTOMY Bilateral 03/02/2022   Procedure:  MAXILLARY ANTROSTOMY;  Surgeon: Karis Clunes, MD;  Location: [facility] OR;  Service: ENT;  Laterality: Bilateral;   NASAL SEPTOPLASTY W/ TURBINOPLASTY Bilateral 03/02/2022   Procedure: NASAL SEPTOPLASTY WITH TURBINATE REDUCTION;  Surgeon: Karis Clunes, MD;  [facility] OR;  Service: ENT;  Laterality: Bilateral;   POLYPECTOMY  07/26/2016   Procedure: POLYPECTOMY;  Surgeon: Lamar HERO Shaaron, MD;  [facility];  Service: Endoscopy;;  colon   POLYPECTOMY  08/07/2021   Procedure: POLYPECTOMY;  Surgeon: Cindie Carlin POUR, DO;  [facility]: [facility];  Service: Endoscopy;;   SINUS ENDO WITH FUSION N/A 03/02/2022   Procedure: SINUS ENDO WITH FUSION;  Surgeon: Karis Clunes, MD;  Location: [facility] OR;  Service: ENT;  Laterality: N/A;   TRANSTHORACIC ECHOCARDIOGRAM  2011   EF 50-55%, stage 1 diastolic dysfunction, trace TR & pulm valve regurg    VASECTOMY  1989    SOCIAL HISTORY:  Social History   Socioeconomic History   Marital status: Married    Spouse name: Nena   Number of children: 1   Years of education: Not on file   Highest education level: Not on file  Occupational History   Not on file  Tobacco Use   Smoking status: Former    Current packs/day: 0.00    Average packs/day: 1.3 packs/day for 36.0 years (45.0 ttl pk-yrs)    Types: Cigarettes    Start date: 04/1975    Quit date: 04/2011    Years since quitting: 13.0   Smokeless tobacco: Never  Vaping Use   Vaping status: Never Used  Substance and Sexual Activity   Alcohol use: No   Drug use: No   Sexual activity: Not Currently  Other Topics Concern   Not on file  Social History Narrative   Not on file   Social Drivers of Health   Financial Resource Strain: Low Risk  (08/16/2020)   Overall Financial Resource Strain (CARDIA)    Difficulty of Paying Living Expenses: Not hard at all  Food Insecurity: No Food Insecurity (08/14/2023)   Hunger Vital Sign    Worried About Running Out of Food in the Last Year: Never true    Ran Out of Food in  the Last Year: Never true  Transportation Needs: No Transportation Needs (08/14/2023)   PRAPARE - Administrator, Civil Service (Medical): No    Lack of Transportation (Non-Medical): No  Physical Activity: Sufficiently Active (08/16/2020)   Exercise Vital Sign    Days of Exercise per Week: 7 days    Minutes of Exercise per Session: 30 min  Stress: No Stress Concern Present (08/16/2020)   [employer] of Occupational Health - Occupational Stress Questionnaire    Feeling of Stress : Not at all  Social Connections: Moderately Integrated (08/14/2023)   Social Connection and Isolation Panel    Frequency of Communication  with Friends and Family: Three times a week    Frequency of Social Gatherings with Friends and Family: Three times a week    Attends Religious Services: More than 4 times per year    Active Member of Clubs or Organizations: No    Attends Banker Meetings: Never    Marital Status: Married  Catering manager Violence: Not At Risk (08/14/2023)   Humiliation, Afraid, Rape, and Kick questionnaire    Fear of Current or Ex-Partner: No    Emotionally Abused: No    Physically Abused: No    Sexually Abused: No    FAMILY HISTORY:  Family History  Problem Relation Age of Onset   Heart disease Mother    Cancer Mother        lymph nodes   Diabetes Mother    Arthritis Other    Lung disease Other    Asthma Other    Kidney disease Other    Ovarian cancer Sister    Diabetes Brother    Heart disease Brother    Hypertension Brother    Diabetes Sister    Diabetes Sister    Cirrhosis Sister 8       no etoh   Colon cancer Neg Hx     CURRENT MEDICATIONS:  Outpatient Encounter Medications as of 04/28/2024  Medication Sig   acetaminophen  (TYLENOL ) 325 MG tablet Take 2 tablets (650 mg total) by mouth every 6 (six) hours as needed for mild pain (pain score 1-3) (or Fever >/= 101).   albuterol  (PROVENTIL ) (2.5 MG/3ML) 0.083% nebulizer solution Take 3 mLs (2.5 mg  total) by nebulization every 2 (two) hours as needed for wheezing or shortness of breath (cough).   albuterol  (VENTOLIN  HFA) 108 (90 Base) MCG/ACT inhaler Inhale 2 puffs into the lungs every 4 (four) hours as needed for wheezing or shortness of breath.   ALPRAZolam  (XANAX ) 0.25 MG tablet Take 0.25 mg by mouth 2 (two) times daily as needed for sleep or anxiety.   aspirin  EC 81 MG tablet Take 81 mg by mouth daily. Swallow whole.   atorvastatin  (LIPITOR) 40 MG tablet Take 40 mg by mouth in the morning.   BREZTRI  AEROSPHERE 160-9-4.8 MCG/ACT AERO INHALE 2 PUFFS INTO THE LUNGS TWICE A DAY   Cholecalciferol  (VITAMIN D3) 25 MCG (1000 UT) CAPS Take 1,000 Units by mouth in the morning.   diltiazem  (CARDIZEM  CD) 240 MG 24 hr capsule Take 240 mg by mouth daily.   EMBECTA PEN NEEDLE ULTRAFINE 31G X 5 MM MISC    famotidine  (PEPCID ) 20 MG tablet TAKE 1 TABLET BY MOUTH EVERY DAY AFTER SUPPER   fluticasone  (FLONASE ) 50 MCG/ACT nasal spray Place 1 spray into both nostrils daily as needed for allergies or rhinitis.   furosemide  (LASIX ) 40 MG tablet Take 1 tablet (40 mg total) by mouth daily in the morning and take 1/2 tablet (20 mg total) by mouth daily in the afternoon.   gabapentin  (NEURONTIN ) 300 MG capsule Take 1 capsule (300 mg total) by mouth at bedtime.   glimepiride  (AMARYL ) 1 MG tablet Take 1 mg by mouth daily with breakfast.   insulin  glargine, 2 Unit Dial, (TOUJEO  MAX) 300 UNIT/ML Solostar Pen Inject into the skin.   metFORMIN  (GLUCOPHAGE ) 1000 MG tablet Take 1,000 mg by mouth daily with supper.   Multiple Vitamin (MULTIVITAMIN WITH MINERALS) TABS tablet Take 1 tablet by mouth at bedtime.   OXYGEN  Inhale 4 L into the lungs continuous.   pantoprazole  (PROTONIX ) 40 MG tablet  TAKE 1 TABLET (40 MG TOTAL) BY MOUTH IN THE MORNING   pioglitazone (ACTOS) 15 MG tablet Take 15 mg by mouth daily.   predniSONE  (DELTASONE ) 20 MG tablet Take 20 mg by mouth 2 (two) times daily.   pyridOXINE (B-6) 50 MG tablet Take 50  mg by mouth daily.   sertraline  (ZOLOFT ) 50 MG tablet Take 1 tablet (50 mg total) by mouth daily.   tamsulosin  (FLOMAX ) 0.4 MG CAPS capsule Take 0.4 mg by mouth in the morning.   vitamin C  (ASCORBIC ACID ) 250 MG tablet Take 250 mg by mouth in the morning.   Wheat Dextrin (BENEFIBER PO) Take 1 Capful by mouth daily.   [DISCONTINUED] amoxicillin -clavulanate (AUGMENTIN ) 875-125 MG tablet Take 1 tablet by mouth 2 (two) times daily.   No facility-administered encounter medications on file as of 04/28/2024.    ALLERGIES:  Allergies  Allergen Reactions   Lioresal [Baclofen] Other (See Comments)    Lethargy     Zocor [Simvastatin] Nausea Only    LABORATORY DATA:  I have reviewed the labs as listed.  CBC    Component Value Date/Time   WBC 13.2 (H) 04/21/2024 1241   RBC 5.33 04/21/2024 1241   HGB 14.3 04/21/2024 1241   HGB 14.3 03/23/2024 0949   HCT 44.4 04/21/2024 1241   HCT 45.4 03/23/2024 0949   PLT 196 04/21/2024 1241   PLT 105 (L) 03/23/2024 0949   MCV 83.3 04/21/2024 1241   MCV 87 03/23/2024 0949   MCH 26.8 04/21/2024 1241   MCHC 32.2 04/21/2024 1241   RDW 14.6 04/21/2024 1241   RDW 15.4 03/23/2024 0949   LYMPHSABS 2.4 04/21/2024 1241   LYMPHSABS 2.1 03/23/2024 0949   MONOABS 0.0 (L) 04/21/2024 1241   EOSABS 0.0 04/21/2024 1241   EOSABS 0.2 03/23/2024 0949   BASOSABS 0.0 04/21/2024 1241   BASOSABS 0.1 03/23/2024 0949      Latest Ref Rng & Units 04/21/2024   12:41 PM 03/23/2024    9:49 AM 02/25/2024    9:49 AM  CMP  Glucose 70 - 99 mg/dL 854  895  676   BUN 8 - 23 mg/dL 25  21  18    Creatinine 0.61 - 1.24 mg/dL 9.10  8.96  9.04   Sodium 135 - 145 mmol/L 140  145  142   Potassium 3.5 - 5.1 mmol/L 3.8  3.9  5.1   Chloride 98 - 111 mmol/L 100  100  98   CO2 22 - 32 mmol/L 33  26  24   Calcium  8.9 - 10.3 mg/dL 9.9  9.4  9.7   Total Protein 6.5 - 8.1 g/dL 8.1  7.3    Total Bilirubin 0.0 - 1.2 mg/dL 0.4  0.7    Alkaline Phos 38 - 126 U/L 124  103    AST 15 - 41 U/L 15   18    ALT 0 - 44 U/L 31  30      DIAGNOSTIC IMAGING:  I have independently reviewed the relevant imaging and discussed with the patient.   WRAP UP:  All questions were answered. The patient knows to call the clinic with any problems, questions or concerns.  Medical decision making: Moderate  Time spent on visit: I spent 20 minutes counseling the patient face to face. The total time spent in the appointment was 30 minutes and more than 50% was on counseling.  Christopher CHRISTELLA Barefoot, PA-C  04/28/24 8:44 AM

## 2024-04-28 ENCOUNTER — Encounter (HOSPITAL_COMMUNITY)

## 2024-04-28 ENCOUNTER — Inpatient Hospital Stay: Admitting: Physician Assistant

## 2024-04-28 VITALS — BP 117/66 | HR 95 | Temp 98.1°F | Resp 24 | Wt 166.9 lb

## 2024-04-28 DIAGNOSIS — E611 Iron deficiency: Secondary | ICD-10-CM | POA: Diagnosis not present

## 2024-04-28 DIAGNOSIS — E1129 Type 2 diabetes mellitus with other diabetic kidney complication: Secondary | ICD-10-CM | POA: Diagnosis not present

## 2024-04-28 DIAGNOSIS — Z79899 Other long term (current) drug therapy: Secondary | ICD-10-CM | POA: Diagnosis not present

## 2024-04-28 DIAGNOSIS — J449 Chronic obstructive pulmonary disease, unspecified: Secondary | ICD-10-CM | POA: Diagnosis not present

## 2024-04-28 DIAGNOSIS — K7460 Unspecified cirrhosis of liver: Secondary | ICD-10-CM | POA: Diagnosis not present

## 2024-04-28 DIAGNOSIS — R945 Abnormal results of liver function studies: Secondary | ICD-10-CM | POA: Diagnosis not present

## 2024-04-28 DIAGNOSIS — D696 Thrombocytopenia, unspecified: Secondary | ICD-10-CM

## 2024-04-28 NOTE — Patient Instructions (Signed)
 [facility] at [facility] **VISIT SUMMARY & IMPORTANT INSTRUCTIONS **   You were seen today by Pleasant Barefoot PA-C for your low platelets.    LOW PLATELETS Your low platelets are caused by your liver disease and enlarged spleen. You do not need any treatment of your low platelets at this time, but we will continue to monitor them with repeat labs and an office visit in 1 year.  IRON DEFICIENCY Take iron tablet (ferrous sulfate  325 mg) every other day.  FOLLOW-UP APPOINTMENT: 1 year  ** Thank you for trusting me with your healthcare!  I strive to provide all of my patients with quality care at each visit.  If you receive a survey for this visit, I would be so grateful to you for taking the time to provide feedback.  Thank you in advance!  ~ Sidney Silberman                                        Dr. Mickiel Davonna Pleasant Barefoot, PA-C          Delon Hope, NP   - - - - - - - - - - - - - - - - - -    Thank you for choosing [facility] at [facility] to provide your oncology and hematology care.  To afford each patient quality time with our provider, please arrive at least 15 minutes before your scheduled appointment time.   If you have a lab appointment with the Cancer Center please come in thru the Main Entrance and check in at the main information desk.  You need to re-schedule your appointment should you arrive 10 or more minutes late.  We strive to give you quality time with our providers, and arriving late affects you and other patients whose appointments are after yours.  Also, if you no show three or more times for appointments you may be dismissed from the clinic at the providers discretion.     Again, thank you for choosing [facility].  Our hope is that these requests will decrease the amount of time that you wait before being seen by our physicians.        _____________________________________________________________  Should you have questions after your visit to [facility], please contact our office at [phone] and follow the prompts.  Our office hours are 8:00 a.m. and 4:30 p.m. Monday - Friday.  Please note that voicemails left after 4:00 p.m. may not be returned until the following business day.  We are closed weekends and major holidays.  You do have access to a nurse 24-7, just call the main number to the clinic [phone] and do not press any options, hold on the line and a nurse will answer the phone.    For prescription refill requests, have your pharmacy contact our office and allow 72 hours.

## 2024-04-30 ENCOUNTER — Encounter (HOSPITAL_COMMUNITY)

## 2024-05-05 ENCOUNTER — Encounter (HOSPITAL_COMMUNITY)

## 2024-05-05 DIAGNOSIS — E785 Hyperlipidemia, unspecified: Secondary | ICD-10-CM | POA: Diagnosis not present

## 2024-05-05 DIAGNOSIS — E1129 Type 2 diabetes mellitus with other diabetic kidney complication: Secondary | ICD-10-CM | POA: Diagnosis not present

## 2024-05-05 DIAGNOSIS — K7460 Unspecified cirrhosis of liver: Secondary | ICD-10-CM | POA: Diagnosis not present

## 2024-05-05 DIAGNOSIS — Z23 Encounter for immunization: Secondary | ICD-10-CM | POA: Diagnosis not present

## 2024-05-05 DIAGNOSIS — J440 Chronic obstructive pulmonary disease with acute lower respiratory infection: Secondary | ICD-10-CM | POA: Diagnosis not present

## 2024-05-06 ENCOUNTER — Ambulatory Visit (HOSPITAL_COMMUNITY)
Admission: RE | Admit: 2024-05-06 | Discharge: 2024-05-06 | Disposition: A | Discharge status: Home / Self Care | Source: Ambulatory Visit | Attending: Internal Medicine | Admitting: Internal Medicine

## 2024-05-06 ENCOUNTER — Other Ambulatory Visit (HOSPITAL_COMMUNITY): Payer: Self-pay | Admitting: Internal Medicine

## 2024-05-06 DIAGNOSIS — M25551 Pain in right hip: Secondary | ICD-10-CM | POA: Insufficient documentation

## 2024-05-06 DIAGNOSIS — R300 Dysuria: Secondary | ICD-10-CM | POA: Diagnosis not present

## 2024-05-07 ENCOUNTER — Encounter (HOSPITAL_COMMUNITY)

## 2024-05-07 ENCOUNTER — Telehealth (HOSPITAL_COMMUNITY): Payer: Self-pay

## 2024-05-12 ENCOUNTER — Encounter (HOSPITAL_COMMUNITY)

## 2024-05-13 ENCOUNTER — Encounter (HOSPITAL_COMMUNITY): Payer: Self-pay

## 2024-05-13 DIAGNOSIS — J449 Chronic obstructive pulmonary disease, unspecified: Secondary | ICD-10-CM

## 2024-05-13 NOTE — Progress Notes (Signed)
 Pulmonary Individual Treatment Plan  Patient Details  Name: [name] MRN: [id number] Date of Birth: 07/03/1956 Referring Provider:   Flowsheet Row PULMONARY REHAB OTHER RESP ORIENTATION from 03/25/2024 in [facility] CARDIAC REHABILITATION  Referring Provider Darlean Sharper MD    Initial Encounter Date:  Flowsheet Row PULMONARY REHAB OTHER RESP ORIENTATION from 03/25/2024 in [facility] CARDIAC REHABILITATION  Date 03/25/24    Visit Diagnosis: Chronic obstructive pulmonary disease, unspecified COPD type ([name])  Patient's Home Medications on Admission:  Current Outpatient Medications:    acetaminophen  (TYLENOL ) 325 MG tablet, Take 2 tablets (650 mg total) by mouth every 6 (six) hours as needed for mild pain (pain score 1-3) (or Fever >/= 101)., Disp: , Rfl:    albuterol  (PROVENTIL ) (2.5 MG/3ML) 0.083% nebulizer solution, Take 3 mLs (2.5 mg total) by nebulization every 2 (two) hours as needed for wheezing or shortness of breath (cough)., Disp: 75 mL, Rfl: 2   albuterol  (VENTOLIN  HFA) 108 (90 Base) MCG/ACT inhaler, Inhale 2 puffs into the lungs every 4 (four) hours as needed for wheezing or shortness of breath., Disp: 8 g, Rfl: 2   ALPRAZolam  (XANAX ) 0.25 MG tablet, Take 0.25 mg by mouth 2 (two) times daily as needed for sleep or anxiety., Disp: , Rfl:    aspirin  EC 81 MG tablet, Take 81 mg by mouth daily. Swallow whole., Disp: , Rfl:    atorvastatin  (LIPITOR) 40 MG tablet, Take 40 mg by mouth in the morning., Disp: , Rfl:    BREZTRI  AEROSPHERE 160-9-4.8 MCG/ACT AERO, INHALE 2 PUFFS INTO THE LUNGS TWICE A DAY, Disp: 10.7 each, Rfl: 11   Cholecalciferol  (VITAMIN D3) 25 MCG (1000 UT) CAPS, Take 1,000 Units by mouth in the morning., Disp: , Rfl:    diltiazem  (CARDIZEM  CD) 240 MG 24 hr capsule, Take 240 mg by mouth daily., Disp: , Rfl:    EMBECTA PEN NEEDLE ULTRAFINE 31G X 5 MM MISC, , Disp: , Rfl:    famotidine  (PEPCID ) 20 MG tablet, TAKE 1 TABLET BY MOUTH EVERY DAY AFTER SUPPER, Disp: 90  tablet, Rfl: 0   fluticasone  (FLONASE ) 50 MCG/ACT nasal spray, Place 1 spray into both nostrils daily as needed for allergies or rhinitis., Disp: , Rfl:    furosemide  (LASIX ) 40 MG tablet, Take 1 tablet (40 mg total) by mouth daily in the morning and take 1/2 tablet (20 mg total) by mouth daily in the afternoon., Disp: 135 tablet, Rfl: 1   gabapentin  (NEURONTIN ) 300 MG capsule, Take 1 capsule (300 mg total) by mouth at bedtime., Disp: 30 capsule, Rfl: 5   glimepiride  (AMARYL ) 1 MG tablet, Take 1 mg by mouth daily with breakfast., Disp: , Rfl:    insulin  glargine, 2 Unit Dial, (TOUJEO  MAX) 300 UNIT/ML Solostar Pen, Inject into the skin., Disp: , Rfl:    metFORMIN  (GLUCOPHAGE ) 1000 MG tablet, Take 1,000 mg by mouth daily with supper., Disp: , Rfl:    Multiple Vitamin (MULTIVITAMIN WITH MINERALS) TABS tablet, Take 1 tablet by mouth at bedtime., Disp: 30 tablet, Rfl: 2   OXYGEN , Inhale 4 L into the lungs continuous., Disp: , Rfl:    pantoprazole  (PROTONIX ) 40 MG tablet, TAKE 1 TABLET (40 MG TOTAL) BY MOUTH IN THE MORNING, Disp: 90 tablet, Rfl: 1   pioglitazone (ACTOS) 15 MG tablet, Take 15 mg by mouth daily., Disp: , Rfl:    predniSONE  (DELTASONE ) 20 MG tablet, Take 20 mg by mouth 2 (two) times daily., Disp: , Rfl:    pyridOXINE (B-6)  50 MG tablet, Take 50 mg by mouth daily., Disp: , Rfl:    sertraline  (ZOLOFT ) 50 MG tablet, Take 1 tablet (50 mg total) by mouth daily., Disp: 30 tablet, Rfl: 3   tamsulosin  (FLOMAX ) 0.4 MG CAPS capsule, Take 0.4 mg by mouth in the morning., Disp: , Rfl:    vitamin C  (ASCORBIC ACID ) 250 MG tablet, Take 250 mg by mouth in the morning., Disp: , Rfl:    Wheat Dextrin (BENEFIBER PO), Take 1 Capful by mouth daily., Disp: , Rfl:   Past Medical History: Past Medical History:  Diagnosis Date   Anxiety    Arthritis    Asthma    Chest pain    a. 2003 Cath: nl cors.   Cirrhosis ([name])    secondary to MASH   Concussion    COPD (chronic obstructive pulmonary disease) ([name])     chronically on 3-4L [place]   Depression    Diabetes mellitus    Dyslipidemia    Tachycardia   Dyspnea    Dysrhythmia    Fracture 06/2014   lower back L-1   GERD (gastroesophageal reflux disease)    Hypertension    NASH (nonalcoholic steatohepatitis) 2018   [name], undergoing routine cirrhosis care; [name]   Pneumonia 12/01/2021   PONV (postoperative nausea and vomiting)    Splenomegaly    Thrombocytopenia     Tobacco Use: Social History   Tobacco Use  Smoking Status Former   Current packs/day: 0.00   Average packs/day: 1.3 packs/day for 36.0 years (45.0 ttl pk-yrs)   Types: Cigarettes   Start date: 04/1975   Quit date: 04/2011   Years since quitting: 13.1  Smokeless Tobacco Never    Labs: Review Flowsheet  More data exists      Latest Ref Rng & Units 05/23/2020 08/18/2020 11/27/2021 06/08/2023 06/09/2023  Labs for ITP Cardiac and Pulmonary Rehab  Hemoglobin A1c 4.8 - 5.6 % 7.9  7.3  6.8  - 7.2   PH, Arterial 7.350 - 7.450 - 7.292  - - -  PCO2 arterial 32.0 - 48.0 mmHg - 42.4  - - -  Bicarbonate 20.0 - 28.0 mmol/L - 19.4  - 31.2  -  Acid-base deficit 0.0 - 2.0 mmol/L - 5.6  - - -  O2 Saturation % - 91.2  - 79.2  -     Pulmonary Assessment Scores:  Pulmonary Assessment Scores     Row Name 03/25/24 0934         ADL UCSD   ADL Phase Entry     SOB Score total 56     Rest 1     Walk 2     Stairs 4     Bath 3     Dress 2     Shop 2       CAT Score   CAT Score 25       mMRC Score   mMRC Score 3        UCSD: Self-administered rating of dyspnea associated with activities of daily living (ADLs) 6-point scale (0 = not at all to 5 = maximal or unable to do because of breathlessness)  Scoring Scores range from 0 to 120.  Minimally important difference is 5 units  CAT: CAT can identify the health impairment of COPD patients and is better correlated with disease progression.  CAT has a scoring range of  zero to 40. The CAT score is classified into four  groups of low (less than 10), medium (10 - 20), high (21-30) and very high (31-40) based on the impact level of disease on health status. A CAT score over 10 suggests significant symptoms.  A worsening CAT score could be explained by an exacerbation, poor medication adherence, poor inhaler technique, or progression of COPD or comorbid conditions.  CAT MCID is 2 points  mMRC: mMRC (Modified Medical Research Council) Dyspnea Scale is used to assess the degree of baseline functional disability in patients of respiratory disease due to dyspnea. No minimal important difference is established. A decrease in score of 1 point or greater is considered a positive change.   Pulmonary Function Assessment:   Exercise Target Goals: Exercise Program Goal: Individual exercise prescription set using results from initial 6 min walk test and THRR while considering  patient's activity barriers and safety.   Exercise Prescription Goal: Initial exercise prescription builds to 30-45 minutes a day of aerobic activity, 2-3 days per week.  Home exercise guidelines will be given to patient during program as part of exercise prescription that the participant will acknowledge.  Education: Aerobic Exercise: - Group verbal and visual presentation on the components of exercise prescription. Introduces F.I.T.T principle from ACSM for exercise prescriptions.  Reviews F.I.T.T. principles of aerobic exercise including progression. Written material provided at class time.   Education: Resistance Exercise: - Group verbal and visual presentation on the components of exercise prescription. Introduces F.I.T.T principle from ACSM for exercise prescriptions  Reviews F.I.T.T. principles of resistance exercise including progression. Written material provided at class time.    Education: Exercise & Equipment Safety: - Individual verbal instruction and demonstration of equipment use and  safety with use of the equipment.   Education: Exercise Physiology & General Exercise Guidelines: - Group verbal and written instruction with models to review the exercise physiology of the cardiovascular system and associated critical values. Provides general exercise guidelines with specific guidelines to those with heart or lung disease.    Education: Flexibility, Balance, Mind/Body Relaxation: - Group verbal and visual presentation with interactive activity on the components of exercise prescription. Introduces F.I.T.T principle from ACSM for exercise prescriptions. Reviews F.I.T.T. principles of flexibility and balance exercise training including progression. Also discusses the mind body connection.  Reviews various relaxation techniques to help reduce and manage stress (i.e. Deep breathing, progressive muscle relaxation, and visualization). Balance handout provided to take home. Written material provided at class time.   Activity Barriers & Risk Stratification:  Activity Barriers & Cardiac Risk Stratification - 03/23/24 1314       Activity Barriers & Cardiac Risk Stratification   Activity Barriers Back Problems;Arthritis;Shortness of Breath;History of Falls   OA right hip.         6 Minute Walk:  6 Minute Walk     Row Name 03/25/24 0941         6 Minute Walk   Phase Initial     Distance 800 feet     Walk Time 6 minutes     # of Rest Breaks 0     MPH 1.51     METS 2.25     RPE 13     Perceived Dyspnea  1     VO2 Peak 7.88     Symptoms No     Resting HR 103 bpm     Resting BP 106/60     Resting Oxygen  Saturation  95 %     Exercise Oxygen  Saturation  during 6 min walk 88 %  Max Ex. HR 125 bpm     Max Ex. BP 126/62     2 Minute Post BP 106/62       Interval HR   1 Minute HR 116     2 Minute HR 119     3 Minute HR 120     4 Minute HR 122     5 Minute HR 123     6 Minute HR 125     2 Minute Post HR 97     Interval Heart Rate? Yes       Interval Oxygen     Interval Oxygen ? Yes     Baseline Oxygen  Saturation % 95 %     1 Minute Oxygen  Saturation % 93 %     1 Minute Liters of Oxygen  3 L     2 Minute Oxygen  Saturation % 90 %     2 Minute Liters of Oxygen  3 L     3 Minute Oxygen  Saturation % 88 %     3 Minute Liters of Oxygen  3 L     4 Minute Oxygen  Saturation % 91 %     4 Minute Liters of Oxygen  3 L     5 Minute Oxygen  Saturation % 91 %     5 Minute Liters of Oxygen  3 L     6 Minute Oxygen  Saturation % 93 %     6 Minute Liters of Oxygen  3 L     2 Minute Post Oxygen  Saturation % 97 %     2 Minute Post Liters of Oxygen  3 L       Oxygen  Initial Assessment:  Oxygen  Initial Assessment - 03/25/24 0944       Initial 6 min Walk   Oxygen  Used Continuous    Liters per minute 3      Program Oxygen  Prescription   Program Oxygen  Prescription Continuous    Liters per minute 3          Oxygen  Re-Evaluation:   Oxygen  Discharge (Final Oxygen  Re-Evaluation):   Initial Exercise Prescription:  Initial Exercise Prescription - 03/25/24 0900       Date of Initial Exercise RX and Referring Provider   Date 03/25/24    Referring Provider Darlean Sharper MD      Oxygen    Oxygen  Continuous    Liters 3    Maintain Oxygen  Saturation 88% or higher      Prescription Details   Frequency (times per week) 2    Duration Progress to 30 minutes of continuous aerobic without signs/symptoms of physical distress      Intensity   THRR 40-80% of Max Heartrate 119-141    Ratings of Perceived Exertion 11-13    Perceived Dyspnea 0-4      Resistance Training   Training Prescription Yes    Weight 3    Reps 10-15          Perform Capillary Blood Glucose checks as needed.  Exercise Prescription Changes:   Exercise Prescription Changes     Row Name 03/25/24 0900 03/31/24 1500           Response to Exercise   Blood Pressure (Admit) 106/60 100/60      Blood Pressure (Exercise) 126/62 132/60      Blood Pressure (Exit) 106/62 102/60       Heart Rate (Admit) 103 bpm 108 bpm      Heart Rate (Exercise) 125 bpm 115 bpm      Heart Rate (Exit)  97 bpm 104 bpm      Oxygen  Saturation (Admit) 95 % 92 %      Oxygen  Saturation (Exercise) 88 % 91 %      Oxygen  Saturation (Exit) 97 % 95 %      Rating of Perceived Exertion (Exercise) 13 11      Perceived Dyspnea (Exercise) 1 0      Duration -- Continue with 30 min of aerobic exercise without signs/symptoms of physical distress.      Intensity -- THRR unchanged        Progression   Progression -- Continue to progress workloads to maintain intensity without signs/symptoms of physical distress.        Resistance Training   Training Prescription -- Yes      Weight -- 3      Reps -- 10-15        Oxygen    Oxygen  -- Continuous      Liters -- 3        NuStep   Level -- 1      SPM -- 56      Minutes -- 15      METs -- 1.9        Arm Ergometer   Level -- 1      Watts -- 30      Minutes -- 15      METs -- 1.7        Oxygen    Maintain Oxygen  Saturation -- 88% or higher         Exercise Comments:   Exercise Comments     Row Name 03/31/24 1028           Exercise Comments First full day of exercise!  Patient was oriented to gym and equipment including functions, settings, policies, and procedures.  Patient's individual exercise prescription and treatment plan were reviewed.  All starting workloads were established based on the results of the 6 minute walk test done at initial orientation visit.  The plan for exercise progression was also introduced and progression will be customized based on patient's performance and goals.          Exercise Goals and Review:   Exercise Goals Re-Evaluation :  Exercise Goals Re-Evaluation     Row Name 04/07/24 0827             Exercise Goal Re-Evaluation   Exercise Goals Review Increase Physical Activity;Increase Strength and Stamina;Understanding of Exercise Prescription       Comments Joe has just started PR. HE did well on his  first day getting use to all the equipment and the hand weights. Will continue to montior and progress as able.       Expected Outcomes Short: increase SPM and RPM    long: continue to attend rehab          Discharge Exercise Prescription (Final Exercise Prescription Changes):  Exercise Prescription Changes - 03/31/24 1500       Response to Exercise   Blood Pressure (Admit) 100/60    Blood Pressure (Exercise) 132/60    Blood Pressure (Exit) 102/60    Heart Rate (Admit) 108 bpm    Heart Rate (Exercise) 115 bpm    Heart Rate (Exit) 104 bpm    Oxygen  Saturation (Admit) 92 %    Oxygen  Saturation (Exercise) 91 %    Oxygen  Saturation (Exit) 95 %    Rating of Perceived Exertion (Exercise) 11    Perceived Dyspnea (Exercise) 0  Duration Continue with 30 min of aerobic exercise without signs/symptoms of physical distress.    Intensity THRR unchanged      Progression   Progression Continue to progress workloads to maintain intensity without signs/symptoms of physical distress.      Resistance Training   Training Prescription Yes    Weight 3    Reps 10-15      Oxygen    Oxygen  Continuous    Liters 3      NuStep   Level 1    SPM 56    Minutes 15    METs 1.9      Arm Ergometer   Level 1    Watts 30    Minutes 15    METs 1.7      Oxygen    Maintain Oxygen  Saturation 88% or higher          Nutrition:  Target Goals: Understanding of nutrition guidelines, daily intake of sodium 1500mg , cholesterol 200mg , calories 30% from fat and 7% or less from saturated fats, daily to have 5 or more servings of fruits and vegetables.  Education: Nutrition 1 -Group instruction provided by verbal, written material, interactive activities, discussions, models, and posters to present general guidelines for heart healthy nutrition including macronutrients, label reading, and promoting whole foods over processed counterparts. Education serves as pensions consultant of discussion of heart healthy eating  for all. Written material provided at class time.     Education: Nutrition 2 -Group instruction provided by verbal, written material, interactive activities, discussions, models, and posters to present general guidelines for heart healthy nutrition including sodium, cholesterol, and saturated fat. Providing guidance of habit forming to improve blood pressure, cholesterol, and body weight. Written material provided at class time.     Biometrics:  Pre Biometrics - 03/25/24 0943       Pre Biometrics   Height 5' 8 (1.727 m)    Weight 170 lb 3.1 oz (77.2 kg)    Waist Circumference 46 inches    Hip Circumference 39 inches    Waist to Hip Ratio 1.18 %    BMI (Calculated) 25.88    Grip Strength 27.2 kg    Single Leg Stand 0 seconds           Nutrition Therapy Plan and Nutrition Goals:   Nutrition Assessments:  MEDIFICTS Score Key: >=70 Need to make dietary changes  40-70 Heart Healthy Diet <= 40 Therapeutic Level Cholesterol Diet  Flowsheet Row PULMONARY REHAB OTHER RESP ORIENTATION from 03/25/2024 in [facility] CARDIAC REHABILITATION  Picture Your Plate Total Score on Admission 65   Picture Your Plate Scores: <59 Unhealthy dietary pattern with much room for improvement. 41-50 Dietary pattern unlikely to meet recommendations for good health and room for improvement. 51-60 More healthful dietary pattern, with some room for improvement.  >60 Healthy dietary pattern, although there may be some specific behaviors that could be improved.   Nutrition Goals Re-Evaluation:   Nutrition Goals Discharge (Final Nutrition Goals Re-Evaluation):   Psychosocial: Target Goals: Acknowledge presence or absence of significant depression and/or stress, maximize coping skills, provide positive support system. Participant is able to verbalize types and ability to use techniques and skills needed for reducing stress and depression.   Education: Stress, Anxiety, and Depression - Group verbal  and visual presentation to define topics covered.  Reviews how body is impacted by stress, anxiety, and depression.  Also discusses healthy ways to reduce stress and to treat/manage anxiety and depression.  Written material provided at class time.  Education: Sleep Hygiene -Provides group verbal and written instruction about how sleep can affect your health.  Define sleep hygiene, discuss sleep cycles and impact of sleep habits. Review good sleep hygiene tips.    Initial Review & Psychosocial Screening:  Initial Psych Review & Screening - 03/23/24 1331       Initial Review   Current issues with History of Depression;Current Psychotropic Meds;Current Sleep Concerns      Family Dynamics   Good Support System? Yes    Comments Patient wife and daughter support him.      Barriers   Psychosocial barriers to participate in program The patient should benefit from training in stress management and relaxation.      Screening Interventions   Interventions To provide support and resources with identified psychosocial needs;Provide feedback about the scores to participant;Encouraged to exercise    Expected Outcomes Short Term goal: Utilizing psychosocial counselor, staff and physician to assist with identification of specific Stressors or current issues interfering with healing process. Setting desired goal for each stressor or current issue identified.;Long Term Goal: Stressors or current issues are controlled or eliminated.;Short Term goal: Identification and review with participant of any Quality of Life or Depression concerns found by scoring the questionnaire.;Long Term goal: The participant improves quality of Life and PHQ9 Scores as seen by post scores and/or verbalization of changes          Quality of Life Scores:  Scores of 19 and below usually indicate a poorer quality of life in these areas.  A difference of  2-3 points is a clinically meaningful difference.  A difference of 2-3 points  in the total score of the Quality of Life Index has been associated with significant improvement in overall quality of life, self-image, physical symptoms, and general health in studies assessing change in quality of life.  PHQ-9: Review Flowsheet  More data may exist      04/28/2024 03/25/2024 08/16/2020 01/15/2018 12/18/2017  Depression screen PHQ 2/9  Decreased Interest 0 2 0 0 0  Down, Depressed, Hopeless 0 1 0 0 0  PHQ - 2 Score 0 3 0 0 0  Altered sleeping - 1 - - -  Tired, decreased energy - 1 - - -  Change in appetite - 1 - - -  Feeling bad or failure about yourself  - 1 - - -  Trouble concentrating - 1 - - -  Moving slowly or fidgety/restless - 1 - - -  Suicidal thoughts - 0 - - -  PHQ-9 Score - 9 - - -  Difficult doing work/chores - Somewhat difficult - - -   Interpretation of Total Score  Total Score Depression Severity:  1-4 = Minimal depression, 5-9 = Mild depression, 10-14 = Moderate depression, 15-19 = Moderately severe depression, 20-27 = Severe depression   Psychosocial Evaluation and Intervention:  Psychosocial Evaluation - 03/23/24 1332       Psychosocial Evaluation & Interventions   Interventions Stress management education;Relaxation education;Encouraged to exercise with the program and follow exercise prescription    Comments Patient was referred to pulmonary rehab with COPD. He says he does have some depression/anxiety managed with Zoloft  and Alprazolam . He lives with his wife who is his main support person along with his daughter when needed. He says he has trouble staying asleep some nights. His goals for the program are to improve his mobility and breathing. 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 He has no barriers identified to complete the program.    Expected Outcomes Short Term: Patient will start the program and attend consistently. Long Term: Patient will complete the program meeting  personal goals.    Continue Psychosocial Services  Follow up required by staff          Psychosocial Re-Evaluation:   Psychosocial Discharge (Final Psychosocial Re-Evaluation):   Education: Education Goals: Education classes will be provided on a weekly basis, covering required topics. Participant will state understanding/return demonstration of topics presented.  Learning Barriers/Preferences:  Learning Barriers/Preferences - 03/23/24 1330       Learning Barriers/Preferences   Learning Barriers None    Learning Preferences Skilled Demonstration;Pictoral          General Pulmonary Education Topics:  Infection Prevention: - Provides verbal and written material to individual with discussion of infection control including proper hand washing and proper equipment cleaning during exercise session.   Falls Prevention: - Provides verbal and written material to individual with discussion of falls prevention and safety.   Chronic Lung Disease Review: - Group verbal instruction with posters, models, PowerPoint presentations and videos,  to review new updates, new respiratory medications, new advancements in procedures and treatments. Providing information on websites and 800 numbers for continued self-education. Includes information about supplement oxygen , available portable oxygen  systems, continuous and intermittent flow rates, oxygen  safety, concentrators, and Medicare reimbursement for oxygen . Explanation of Pulmonary Drugs, including class, frequency, complications, importance of spacers, rinsing mouth after steroid MDI's, and proper cleaning methods for nebulizers. Review of basic lung anatomy and physiology related to function, structure, and complications of lung disease. Review of risk factors. Discussion about methods for diagnosing sleep apnea and types of masks and machines for OSA. Includes a review of the use of types of environmental controls: home humidity, furnaces,  filters, dust mite/pet prevention, HEPA vacuums. Discussion about weather changes, air quality and the benefits of nasal washing. Instruction on Warning signs, infection symptoms, calling MD promptly, preventive modes, and value of vaccinations. Review of effective airway clearance, coughing and/or vibration techniques. Emphasizing that all should Create an Action Plan. Written material provided at class time.   AED/CPR: - Group verbal and written instruction with the use of models to demonstrate the basic use of the AED with the basic ABC's of resuscitation.    Tests and Procedures:  - Group verbal and visual presentation and models provide information about basic cardiac anatomy and function. Reviews the testing methods done to diagnose heart disease and the outcomes of the test results. Describes the treatment choices: Medical Management, Angioplasty, or Coronary Bypass Surgery for treating various heart conditions including Myocardial Infarction, Angina, Valve Disease, and Cardiac Arrhythmias.  Written material provided at class time.   Medication Safety: - Group verbal and visual instruction to review commonly prescribed medications for heart and lung disease. Reviews the medication, class of the drug, and side effects. Includes the steps to properly store meds and maintain the prescription regimen.  Written material given at graduation. Flowsheet Row PULMONARY REHAB OTHER RESPIRATORY from 04/09/2024 in [facility]  Date 04/09/24  Educator [facility]  Instruction Review Code 1- Verbalizes Understanding    Other: -Provides group and verbal instruction on various topics (see comments)   Knowledge Questionnaire Score:  Knowledge Questionnaire Score - 03/25/24 0951       Knowledge Questionnaire Score   Pre Score 15/18  Core Components/Risk Factors/Patient Goals at Admission:  Personal Goals and Risk Factors at Admission - 03/23/24 1330       Core  Components/Risk Factors/Patient Goals on Admission    Weight Management Weight Maintenance    Improve shortness of breath with ADL's Yes    Intervention Provide education, individualized exercise plan and daily activity instruction to help decrease symptoms of SOB with activities of daily living.    Expected Outcomes Short Term: Improve cardiorespiratory fitness to achieve a reduction of symptoms when performing ADLs;Long Term: Be able to perform more ADLs without symptoms or delay the onset of symptoms    Increase knowledge of respiratory medications and ability to use respiratory devices properly  Yes    Intervention Provide education and demonstration as needed of appropriate use of medications, inhalers, and oxygen  therapy.    Expected Outcomes Short Term: Achieves understanding of medications use. Understands that oxygen  is a medication prescribed by physician. Demonstrates appropriate use of inhaler and oxygen  therapy.;Long Term: Maintain appropriate use of medications, inhalers, and oxygen  therapy.    Diabetes Yes    Intervention Provide education about signs/symptoms and action to take for hypo/hyperglycemia.;Provide education about proper nutrition, including hydration, and aerobic/resistive exercise prescription along with prescribed medications to achieve blood glucose in normal ranges: Fasting glucose 65-99 mg/dL    Expected Outcomes Short Term: Participant verbalizes understanding of the signs/symptoms and immediate care of hyper/hypoglycemia, proper foot care and importance of medication, aerobic/resistive exercise and nutrition plan for blood glucose control.;Long Term: Attainment of HbA1C < 7%.    Hypertension Yes    Intervention Provide education on lifestyle modifcations including regular physical activity/exercise, weight management, moderate sodium restriction and increased consumption of fresh fruit, vegetables, and low fat dairy, alcohol moderation, and smoking cessation.;Monitor  prescription use compliance.    Expected Outcomes Short Term: Continued assessment and intervention until BP is < 140/71mm HG in hypertensive participants. < 130/62mm HG in hypertensive participants with diabetes, heart failure or chronic kidney disease.;Long Term: Maintenance of blood pressure at goal levels.    Lipids Yes    Intervention Provide education and support for participant on nutrition & aerobic/resistive exercise along with prescribed medications to achieve LDL 70mg , HDL >40mg .    Expected Outcomes Short Term: Participant states understanding of desired cholesterol values and is compliant with medications prescribed. Participant is following exercise prescription and nutrition guidelines.;Long Term: Cholesterol controlled with medications as prescribed, with individualized exercise RX and with personalized nutrition plan. Value goals: LDL < 70mg , HDL > 40 mg.          Education:Diabetes - Individual verbal and written instruction to review signs/symptoms of diabetes, desired ranges of glucose level fasting, after meals and with exercise. Acknowledge that pre and post exercise glucose checks will be done for 3 sessions at entry of program.   Know Your Numbers and Heart Failure: - Group verbal and visual instruction to discuss disease risk factors for cardiac and pulmonary disease and treatment options.  Reviews associated critical values for Overweight/Obesity, Hypertension, Cholesterol, and Diabetes.  Discusses basics of heart failure: signs/symptoms and treatments.  Introduces Heart Failure Zone chart for action plan for heart failure. Written material provided at class time. Flowsheet Row PULMONARY REHAB OTHER RESPIRATORY from 04/09/2024 in [facility]  Date 04/02/24  Educator [facility]  Instruction Review Code 1- Verbalizes Understanding    Core Components/Risk Factors/Patient Goals Review:    Core Components/Risk Factors/Patient Goals at Discharge (Final  Review):    ITP Comments:  ITP  Comments     Row Name 03/23/24 1338 03/25/24 0955 03/31/24 1028 04/15/24 1532 05/13/24 0900   ITP Comments Virtual orientation visit completed for pulmonary rehab with COPD. On-site orientation visit scheduled for 03/25/24 at 8:30. Patient arrived for 1st visit/orientation/education at 0830. Patient was referred to PR by Dr. Darlean due to COPD. During orientation advised patient on arrival and appointment times what to wear, what to do before, during and after exercise. Reviewed attendance and class policy.  Pt is scheduled to return Pulmonary Rehab on 03/31/24 at 10:30. Pt was advised to come to class 15 minutes before class starts.  Discussed RPE/Dpysnea scales. Patient participated in warm up stretches. Patient was able to complete 6 minute walk test. Patient was measured for the equipment. Discussed equipment safety with patient. Took patient pre-anthropometric measurements. Patient finished visit at 950. First full day of exercise!  Patient was oriented to gym and equipment including functions, settings, policies, and procedures.  Patient's individual exercise prescription and treatment plan were reviewed.  All starting workloads were established based on the results of the 6 minute walk test done at initial orientation visit.  The plan for exercise progression was also introduced and progression will be customized based on patient's performance and goals. 30 day review completed. ITP sent to Dr.Jehanzeb Memon, Medical Director of  Pulmonary Rehab. Continue with ITP unless changes are made by physician.    Newer to program 30 day review completed. ITP sent to Dr.Jehanzeb Memon, Medical Director of  Pulmonary Rehab. Continue with ITP unless changes are made by physician.    Newer to program, been out sick with pneumonia.      Comments: 30 day review

## 2024-05-14 ENCOUNTER — Encounter (HOSPITAL_COMMUNITY)

## 2024-05-14 DIAGNOSIS — J449 Chronic obstructive pulmonary disease, unspecified: Secondary | ICD-10-CM | POA: Insufficient documentation

## 2024-05-18 ENCOUNTER — Other Ambulatory Visit: Payer: Self-pay | Admitting: Internal Medicine

## 2024-05-19 ENCOUNTER — Telehealth (HOSPITAL_COMMUNITY): Payer: Self-pay | Admitting: *Deleted

## 2024-05-19 ENCOUNTER — Encounter (HOSPITAL_COMMUNITY)

## 2024-05-19 NOTE — Telephone Encounter (Signed)
 Called to check on patient.  He is still in a lot of pain in hips and back. He is scheduled to meet with a new doctor for his pain later this week.  He hopes to be able to return next week.

## 2024-05-21 ENCOUNTER — Encounter (HOSPITAL_COMMUNITY)

## 2024-05-26 ENCOUNTER — Encounter (HOSPITAL_COMMUNITY)
Admission: RE | Admit: 2024-05-26 | Discharge: 2024-05-26 | Disposition: A | Discharge status: Home / Self Care | Source: Ambulatory Visit | Attending: Internal Medicine | Admitting: Internal Medicine

## 2024-05-26 DIAGNOSIS — J449 Chronic obstructive pulmonary disease, unspecified: Secondary | ICD-10-CM | POA: Diagnosis not present

## 2024-05-26 NOTE — Progress Notes (Signed)
 Daily Session Note  Patient Details  Name: [name] MRN: [id number] Date of Birth: Jul 17, 1955 Referring Provider:   Flowsheet Row PULMONARY REHAB OTHER RESP ORIENTATION from 03/25/2024 in [facility]  Referring Provider Darlean Sharper MD    Encounter Date: 05/26/2024  Check In:  Session Check In - 05/26/24 1023       Check-In   Supervising physician immediately available to respond to emergencies See telemetry face sheet for immediately available MD    Location [facility]    Staff Present Powell Benders, BS, Exercise Physiologist;Brittany Jackquline, BSN, RN, WTA-C;Jonthan Leite Zina, RN    Virtual Visit No    Medication changes reported     No    Fall or balance concerns reported    No    Warm-up and Cool-down Performed on first and last piece of equipment    Resistance Training Performed Yes    VAD Patient? No    PAD/SET Patient? No      Pain Assessment   Currently in Pain? No/denies          Capillary Blood Glucose: No results found for this or any previous visit (from the past 24 hours).    Social History   Tobacco Use  Smoking Status Former   Current packs/day: 0.00   Average packs/day: 1.3 packs/day for 36.0 years (45.0 ttl pk-yrs)   Types: Cigarettes   Start date: 04/1975   Quit date: 04/2011   Years since quitting: 13.1  Smokeless Tobacco Never    Goals Met:  Proper associated with RPD/PD & O2 Sat Independence with exercise equipment Using PLB without cueing & demonstrates good technique Exercise tolerated well No report of concerns or symptoms today Strength training completed today  Goals Unmet:  Not Applicable  Comments: Pt able to follow exercise prescription today without complaint.  Will continue to monitor for progression.

## 2024-05-28 ENCOUNTER — Encounter (HOSPITAL_COMMUNITY)
Admission: RE | Admit: 2024-05-28 | Discharge: 2024-05-28 | Disposition: A | Discharge status: Home / Self Care | Source: Ambulatory Visit | Attending: Internal Medicine | Admitting: Internal Medicine

## 2024-05-28 DIAGNOSIS — J449 Chronic obstructive pulmonary disease, unspecified: Secondary | ICD-10-CM | POA: Diagnosis not present

## 2024-05-28 NOTE — Progress Notes (Signed)
 Daily Session Note  Patient Details  Name: [name] MRN: [id number] Date of Birth: Dec 03, 1955 Referring Provider:   Flowsheet Row PULMONARY REHAB OTHER RESP ORIENTATION from 03/25/2024 in [facility]  Referring Provider Darlean Sharper MD    Encounter Date: 05/28/2024  Check In:  Session Check In - 05/28/24 1029       Check-In   Supervising physician immediately available to respond to emergencies See telemetry face sheet for immediately available MD    Location [facility]    Staff Present Powell Benders, BS, Exercise Physiologist;Jessica Vonzell, MA, RCEP, CCRP, CCET;Toua Stites BSN, RN    Virtual Visit No    Medication changes reported     No    Fall or balance concerns reported    No    Tobacco Cessation No Change    Warm-up and Cool-down Performed on first and last piece of equipment    Resistance Training Performed Yes    VAD Patient? No    PAD/SET Patient? No      Pain Assessment   Currently in Pain? No/denies    Pain Score 0-No pain    Multiple Pain Sites No          Capillary Blood Glucose: No results found for this or any previous visit (from the past 24 hours).    Social History   Tobacco Use  Smoking Status Former   Current packs/day: 0.00   Average packs/day: 1.3 packs/day for 36.0 years (45.0 ttl pk-yrs)   Types: Cigarettes   Start date: 04/1975   Quit date: 04/2011   Years since quitting: 13.1  Smokeless Tobacco Never    Goals Met:  Proper associated with RPD/PD & O2 Sat Independence with exercise equipment Using PLB without cueing & demonstrates good technique Exercise tolerated well Queuing for purse lip breathing No report of concerns or symptoms today Strength training completed today  Goals Unmet:  Not Applicable  Comments: SABRASABRAPt able to follow exercise prescription today without complaint.  Will continue to monitor for progression.

## 2024-05-29 DIAGNOSIS — M25551 Pain in right hip: Secondary | ICD-10-CM | POA: Diagnosis not present

## 2024-05-29 DIAGNOSIS — M545 Low back pain, unspecified: Secondary | ICD-10-CM | POA: Diagnosis not present

## 2024-06-02 ENCOUNTER — Encounter (HOSPITAL_COMMUNITY)
Admission: RE | Admit: 2024-06-02 | Discharge: 2024-06-02 | Disposition: A | Source: Ambulatory Visit | Attending: Internal Medicine | Admitting: Internal Medicine

## 2024-06-02 ENCOUNTER — Encounter: Payer: Self-pay | Admitting: Gastroenterology

## 2024-06-02 DIAGNOSIS — J449 Chronic obstructive pulmonary disease, unspecified: Secondary | ICD-10-CM | POA: Diagnosis not present

## 2024-06-02 NOTE — Progress Notes (Signed)
 Daily Session Note  Patient Details  Name: [name] MRN: [id number] Date of Birth: [birth year] Referring Provider:   Flowsheet Row PULMONARY REHAB OTHER RESP ORIENTATION from 03/25/2024 in [facility]  Referring Provider Darlean Sharper MD    Encounter Date: 06/02/2024  Check In:  Session Check In - 06/02/24 1023       Check-In   Supervising physician immediately available to respond to emergencies See telemetry face sheet for immediately available MD    Location [facility]    Staff Present Powell Benders, BS, Exercise Physiologist;Brittany Jackquline, BSN, RN, WTA-C;Joslyn Ramos Zina, RN    Virtual Visit No    Medication changes reported     No    Fall or balance concerns reported    No    Warm-up and Cool-down Performed on first and last piece of equipment    Resistance Training Performed Yes    VAD Patient? No    PAD/SET Patient? No      Pain Assessment   Currently in Pain? No/denies          Capillary Blood Glucose: No results found for this or any previous visit (from the past 24 hours).    Social History   Tobacco Use  Smoking Status Former   Current packs/day: 0.00   Average packs/day: 1.3 packs/day for 36.0 years (45.0 ttl pk-yrs)   Types: Cigarettes   Start date: 04/1975   Quit date: 04/2011   Years since quitting: 13.1  Smokeless Tobacco Never    Goals Met:  Proper associated with RPD/PD & O2 Sat Independence with exercise equipment Using PLB without cueing & demonstrates good technique Exercise tolerated well No report of concerns or symptoms today Strength training completed today  Goals Unmet:  Not Applicable  Comments: Pt able to follow exercise prescription today without complaint.  Will continue to monitor for progression.

## 2024-06-09 ENCOUNTER — Encounter (HOSPITAL_COMMUNITY): Payer: Self-pay | Admitting: *Deleted

## 2024-06-09 ENCOUNTER — Encounter (HOSPITAL_COMMUNITY)
Admission: RE | Admit: 2024-06-09 | Discharge: 2024-06-09 | Disposition: A | Discharge status: Home / Self Care | Source: Ambulatory Visit | Attending: Internal Medicine | Admitting: Internal Medicine

## 2024-06-09 DIAGNOSIS — J449 Chronic obstructive pulmonary disease, unspecified: Secondary | ICD-10-CM | POA: Diagnosis present

## 2024-06-09 DIAGNOSIS — E119 Type 2 diabetes mellitus without complications: Secondary | ICD-10-CM | POA: Diagnosis not present

## 2024-06-09 NOTE — Progress Notes (Signed)
 Pulmonary Individual Treatment Plan  Patient Details  Name: [name] MRN: [id number] Date of Birth: [birth year] Referring Provider:   Flowsheet Row PULMONARY REHAB OTHER RESP ORIENTATION from 03/25/2024 in [facility] CARDIAC REHABILITATION  Referring Provider Darlean Sharper MD    Initial Encounter Date:  Flowsheet Row PULMONARY REHAB OTHER RESP ORIENTATION from 03/25/2024 in [facility] CARDIAC REHABILITATION  Date 03/25/24    Visit Diagnosis: Chronic obstructive pulmonary disease, unspecified COPD type ([name])  Patient's Home Medications on Admission:   Current Outpatient Medications:    acetaminophen  (TYLENOL ) 325 MG tablet, Take 2 tablets (650 mg total) by mouth every 6 (six) hours as needed for mild pain (pain score 1-3) (or Fever >/= 101)., Disp: , Rfl:    albuterol  (PROVENTIL ) (2.5 MG/3ML) 0.083% nebulizer solution, Take 3 mLs (2.5 mg total) by nebulization every 2 (two) hours as needed for wheezing or shortness of breath (cough)., Disp: 75 mL, Rfl: 2   albuterol  (VENTOLIN  HFA) 108 (90 Base) MCG/ACT inhaler, Inhale 2 puffs into the lungs every 4 (four) hours as needed for wheezing or shortness of breath., Disp: 8 g, Rfl: 2   ALPRAZolam  (XANAX ) 0.25 MG tablet, Take 0.25 mg by mouth 2 (two) times daily as needed for sleep or anxiety., Disp: , Rfl:    aspirin  EC 81 MG tablet, Take 81 mg by mouth daily. Swallow whole., Disp: , Rfl:    atorvastatin  (LIPITOR) 40 MG tablet, Take 40 mg by mouth in the morning., Disp: , Rfl:    BREZTRI  AEROSPHERE 160-9-4.8 MCG/ACT AERO inhaler, INHALE 2 PUFFS INTO THE LUNGS TWICE A DAY, Disp: 10.7 each, Rfl: 11   Cholecalciferol  (VITAMIN D3) 25 MCG (1000 UT) CAPS, Take 1,000 Units by mouth in the morning., Disp: , Rfl:    diltiazem  (CARDIZEM  CD) 240 MG 24 hr capsule, Take 240 mg by mouth daily., Disp: , Rfl:    EMBECTA PEN NEEDLE ULTRAFINE 31G X 5 MM MISC, , Disp: , Rfl:    famotidine  (PEPCID ) 20 MG tablet, TAKE 1 TABLET BY MOUTH EVERY DAY AFTER SUPPER,  Disp: 90 tablet, Rfl: 0   fluticasone  (FLONASE ) 50 MCG/ACT nasal spray, Place 1 spray into both nostrils daily as needed for allergies or rhinitis., Disp: , Rfl:    furosemide  (LASIX ) 40 MG tablet, Take 1 tablet (40 mg total) by mouth daily in the morning and take 1/2 tablet (20 mg total) by mouth daily in the afternoon., Disp: 135 tablet, Rfl: 1   gabapentin  (NEURONTIN ) 300 MG capsule, Take 1 capsule (300 mg total) by mouth at bedtime., Disp: 30 capsule, Rfl: 5   glimepiride  (AMARYL ) 1 MG tablet, Take 1 mg by mouth daily with breakfast., Disp: , Rfl:    insulin  glargine, 2 Unit Dial, (TOUJEO  MAX) 300 UNIT/ML Solostar Pen, Inject into the skin., Disp: , Rfl:    metFORMIN  (GLUCOPHAGE ) 1000 MG tablet, Take 1,000 mg by mouth daily with supper., Disp: , Rfl:    Multiple Vitamin (MULTIVITAMIN WITH MINERALS) TABS tablet, Take 1 tablet by mouth at bedtime., Disp: 30 tablet, Rfl: 2   OXYGEN , Inhale 4 L into the lungs continuous., Disp: , Rfl:    pantoprazole  (PROTONIX ) 40 MG tablet, TAKE 1 TABLET (40 MG TOTAL) BY MOUTH IN THE MORNING, Disp: 90 tablet, Rfl: 1   pioglitazone (ACTOS) 15 MG tablet, Take 15 mg by mouth daily., Disp: , Rfl:    predniSONE  (DELTASONE ) 20 MG tablet, Take 20 mg by mouth 2 (two) times daily., Disp: , Rfl:  pyridOXINE (B-6) 50 MG tablet, Take 50 mg by mouth daily., Disp: , Rfl:    sertraline  (ZOLOFT ) 50 MG tablet, Take 1 tablet (50 mg total) by mouth daily., Disp: 30 tablet, Rfl: 3   tamsulosin  (FLOMAX ) 0.4 MG CAPS capsule, Take 0.4 mg by mouth in the morning., Disp: , Rfl:    vitamin C  (ASCORBIC ACID ) 250 MG tablet, Take 250 mg by mouth in the morning., Disp: , Rfl:    Wheat Dextrin (BENEFIBER PO), Take 1 Capful by mouth daily., Disp: , Rfl:   Past Medical History: Past Medical History:  Diagnosis Date   Anxiety    Arthritis    Asthma    Chest pain    a. 2003 Cath: nl cors.   Cirrhosis ([name])    secondary to MASH   Concussion    COPD (chronic obstructive pulmonary  disease) ([name])    chronically on 3-4L [place]   Depression    Diabetes mellitus    Dyslipidemia    Tachycardia   Dyspnea    Dysrhythmia    Fracture 06/2014   lower back L-1   GERD (gastroesophageal reflux disease)    Hypertension    NASH (nonalcoholic steatohepatitis) 2018   [name], undergoing routine cirrhosis care; [name]   Pneumonia 12/01/2021   PONV (postoperative nausea and vomiting)    Splenomegaly    Thrombocytopenia     Tobacco Use: Social History   Tobacco Use  Smoking Status Former   Current packs/day: 0.00   Average packs/day: 1.3 packs/day for 36.0 years (45.0 ttl pk-yrs)   Types: Cigarettes   Start date: 04/1975   Quit date: 04/2011   Years since quitting: 13.1  Smokeless Tobacco Never    Labs: Review Flowsheet  More data exists      Latest Ref Rng & Units 05/23/2020 08/18/2020 11/27/2021 06/08/2023 06/09/2023  Labs for ITP Cardiac and Pulmonary Rehab  Hemoglobin A1c 4.8 - 5.6 % 7.9  7.3  6.8  - 7.2   PH, Arterial 7.350 - 7.450 - 7.292  - - -  PCO2 arterial 32.0 - 48.0 mmHg - 42.4  - - -  Bicarbonate 20.0 - 28.0 mmol/L - 19.4  - 31.2  -  Acid-base deficit 0.0 - 2.0 mmol/L - 5.6  - - -  O2 Saturation % - 91.2  - 79.2  -    Capillary Blood Glucose: Lab Results  Component Value Date   GLUCAP 257 (H) 03/31/2024   GLUCAP 240 (H) 08/10/2023   GLUCAP 252 (H) 08/10/2023   GLUCAP 316 (H) 08/10/2023   GLUCAP 356 (H) 08/09/2023     Pulmonary Assessment Scores:  Pulmonary Assessment Scores     Row Name 03/25/24 0934         ADL UCSD   ADL Phase Entry     SOB Score total 56     Rest 1     Walk 2     Stairs 4     Bath 3     Dress 2     Shop 2       CAT Score   CAT Score 25       mMRC Score   mMRC Score 3       UCSD: Self-administered rating of dyspnea associated with activities of daily living (ADLs) 6-point scale (0 = not at all to 5 = maximal or unable to do because of breathlessness)   Scoring Scores range from 0 to  120.  Minimally important difference is 5 units  CAT: CAT can identify the health impairment of COPD patients and is better correlated with disease progression.  CAT has a scoring range of zero to 40. The CAT score is classified into four groups of low (less than 10), medium (10 - 20), high (21-30) and very high (31-40) based on the impact level of disease on health status. A CAT score over 10 suggests significant symptoms.  A worsening CAT score could be explained by an exacerbation, poor medication adherence, poor inhaler technique, or progression of COPD or comorbid conditions.  CAT MCID is 2 points  mMRC: mMRC (Modified Medical Research Council) Dyspnea Scale is used to assess the degree of baseline functional disability in patients of respiratory disease due to dyspnea. No minimal important difference is established. A decrease in score of 1 point or greater is considered a positive change.   Pulmonary Function Assessment:   Exercise Target Goals: Exercise Program Goal: Individual exercise prescription set using results from initial 6 min walk test and THRR while considering  patient's activity barriers and safety.   Exercise Prescription Goal: Initial exercise prescription builds to 30-45 minutes a day of aerobic activity, 2-3 days per week.  Home exercise guidelines will be given to patient during program as part of exercise prescription that the participant will acknowledge.  Activity Barriers & Risk Stratification:  Activity Barriers & Cardiac Risk Stratification - 03/23/24 1314       Activity Barriers & Cardiac Risk Stratification   Activity Barriers Back Problems;Arthritis;Shortness of Breath;History of Falls   OA right hip.         6 Minute Walk:  6 Minute Walk     Row Name 03/25/24 0941         6 Minute Walk   Phase Initial     Distance 800 feet     Walk Time 6 minutes     # of Rest Breaks 0     MPH 1.51     METS 2.25     RPE  13     Perceived Dyspnea  1     VO2 Peak 7.88     Symptoms No     Resting HR 103 bpm     Resting BP 106/60     Resting Oxygen  Saturation  95 %     Exercise Oxygen  Saturation  during 6 min walk 88 %     Max Ex. HR 125 bpm     Max Ex. BP 126/62     2 Minute Post BP 106/62       Interval HR   1 Minute HR 116     2 Minute HR 119     3 Minute HR 120     4 Minute HR 122     5 Minute HR 123     6 Minute HR 125     2 Minute Post HR 97     Interval Heart Rate? Yes       Interval Oxygen    Interval Oxygen ? Yes     Baseline Oxygen  Saturation % 95 %     1 Minute Oxygen  Saturation % 93 %     1 Minute Liters of Oxygen  3 L     2 Minute Oxygen  Saturation % 90 %     2 Minute Liters of Oxygen  3 L     3 Minute Oxygen  Saturation % 88 %     3 Minute Liters of Oxygen  3  L     4 Minute Oxygen  Saturation % 91 %     4 Minute Liters of Oxygen  3 L     5 Minute Oxygen  Saturation % 91 %     5 Minute Liters of Oxygen  3 L     6 Minute Oxygen  Saturation % 93 %     6 Minute Liters of Oxygen  3 L     2 Minute Post Oxygen  Saturation % 97 %     2 Minute Post Liters of Oxygen  3 L        Oxygen  Initial Assessment:  Oxygen  Initial Assessment - 03/25/24 0944       Initial 6 min Walk   Oxygen  Used Continuous    Liters per minute 3      Program Oxygen  Prescription   Program Oxygen  Prescription Continuous    Liters per minute 3          Oxygen  Re-Evaluation:  Oxygen  Re-Evaluation     Row Name 05/26/24 1053             Program Oxygen  Prescription   Program Oxygen  Prescription Continuous       Liters per minute 3         Home Oxygen    Home Oxygen  Device Portable Concentrator;Home Concentrator       Sleep Oxygen  Prescription Continuous       Liters per minute 3       Home Exercise Oxygen  Prescription Continuous       Liters per minute 3       Home Resting Oxygen  Prescription Continuous       Liters per minute 3       Compliance with Home Oxygen  Use Yes         Goals/Expected Outcomes    Short Term Goals To learn and exhibit compliance with exercise, home and travel O2 prescription;To learn and understand importance of monitoring SPO2 with pulse oximeter and demonstrate accurate use of the pulse oximeter.;To learn and understand importance of maintaining oxygen  saturations>88%;To learn and demonstrate proper pursed lip breathing techniques or other breathing techniques. ;To learn and demonstrate proper use of respiratory medications       Long  Term Goals Exhibits compliance with exercise, home  and travel O2 prescription;Maintenance of O2 saturations>88%;Verbalizes importance of monitoring SPO2 with pulse oximeter and return demonstration;Exhibits proper breathing techniques, such as pursed lip breathing or other method taught during program session;Compliance with respiratory medication;Demonstrates proper use of MDI's       Comments Larnell has had a rough last 2 months with having pneumonia. He feels his breathing is much better after getting over that. He is glad to be back in rehab.       Goals/Expected Outcomes Short: Continue to attend rehab. Long: Continue checking pulse oximetry at home.          Oxygen  Discharge (Final Oxygen  Re-Evaluation):  Oxygen  Re-Evaluation - 05/26/24 1053       Program Oxygen  Prescription   Program Oxygen  Prescription Continuous    Liters per minute 3      Home Oxygen    Home Oxygen  Device Portable Concentrator;Home Concentrator    Sleep Oxygen  Prescription Continuous    Liters per minute 3    Home Exercise Oxygen  Prescription Continuous    Liters per minute 3    Home Resting Oxygen  Prescription Continuous    Liters per minute 3    Compliance with Home Oxygen  Use Yes      Goals/Expected Outcomes  Short Term Goals To learn and exhibit compliance with exercise, home and travel O2 prescription;To learn and understand importance of monitoring SPO2 with pulse oximeter and demonstrate accurate use of the pulse oximeter.;To learn and understand  importance of maintaining oxygen  saturations>88%;To learn and demonstrate proper pursed lip breathing techniques or other breathing techniques. ;To learn and demonstrate proper use of respiratory medications    Long  Term Goals Exhibits compliance with exercise, home  and travel O2 prescription;Maintenance of O2 saturations>88%;Verbalizes importance of monitoring SPO2 with pulse oximeter and return demonstration;Exhibits proper breathing techniques, such as pursed lip breathing or other method taught during program session;Compliance with respiratory medication;Demonstrates proper use of MDI's    Comments Larnell has had a rough last 2 months with having pneumonia. He feels his breathing is much better after getting over that. He is glad to be back in rehab.    Goals/Expected Outcomes Short: Continue to attend rehab. Long: Continue checking pulse oximetry at home.          Initial Exercise Prescription:  Initial Exercise Prescription - 03/25/24 0900       Date of Initial Exercise RX and Referring Provider   Date 03/25/24    Referring Provider Darlean Sharper MD      Oxygen    Oxygen  Continuous    Liters 3    Maintain Oxygen  Saturation 88% or higher      Prescription Details   Frequency (times per week) 2    Duration Progress to 30 minutes of continuous aerobic without signs/symptoms of physical distress      Intensity   THRR 40-80% of Max Heartrate 119-141    Ratings of Perceived Exertion 11-13    Perceived Dyspnea 0-4      Resistance Training   Training Prescription Yes    Weight 3    Reps 10-15          Perform Capillary Blood Glucose checks as needed.  Exercise Prescription Changes:   Exercise Prescription Changes     Row Name 03/25/24 0900 03/31/24 1500 05/26/24 1500         Response to Exercise   Blood Pressure (Admit) 106/60 100/60 102/60     Blood Pressure (Exercise) 126/62 132/60 146/64     Blood Pressure (Exit) 106/62 102/60 92/60     Heart Rate (Admit) 103 bpm  108 bpm 106 bpm     Heart Rate (Exercise) 125 bpm 115 bpm 118 bpm     Heart Rate (Exit) 97 bpm 104 bpm 109 bpm     Oxygen  Saturation (Admit) 95 % 92 % 91 %     Oxygen  Saturation (Exercise) 88 % 91 % 90 %     Oxygen  Saturation (Exit) 97 % 95 % 93 %     Rating of Perceived Exertion (Exercise) 13 11 13      Perceived Dyspnea (Exercise) 1 0 2     Duration -- Continue with 30 min of aerobic exercise without signs/symptoms of physical distress. Continue with 30 min of aerobic exercise without signs/symptoms of physical distress.     Intensity -- THRR unchanged THRR unchanged       Progression   Progression -- Continue to progress workloads to maintain intensity without signs/symptoms of physical distress. Continue to progress workloads to maintain intensity without signs/symptoms of physical distress.       Resistance Training   Training Prescription -- Yes Yes     Weight -- 3 3     Reps -- 10-15 10-15  Oxygen    Oxygen  -- Continuous Continuous     Liters -- 3 3       NuStep   Level -- 1 1     SPM -- 56 94     Minutes -- 15 15     METs -- 1.9 2       Arm Ergometer   Level -- 1 1     Watts -- 30 58     Minutes -- 15 15     METs -- 1.7 2       Oxygen    Maintain Oxygen  Saturation -- 88% or higher 88% or higher        Exercise Comments:   Exercise Comments     Row Name 03/31/24 1028 05/19/24 1107         Exercise Comments First full day of exercise!  Patient was oriented to gym and equipment including functions, settings, policies, and procedures.  Patient's individual exercise prescription and treatment plan were reviewed.  All starting workloads were established based on the results of the 6 minute walk test done at initial orientation visit.  The plan for exercise progression was also introduced and progression will be customized based on patient's performance and goals. Called to check on patient.  He is still in a lot of pain in hips and back. He is scheduled to meet  with a new doctor for his pain later this week.  He hopes to be able to return next week.         Exercise Goals and Review:   Exercise Goals Re-Evaluation :  Exercise Goals Re-Evaluation     Row Name 04/07/24 0827 05/26/24 1051 05/28/24 0933         Exercise Goal Re-Evaluation   Exercise Goals Review Increase Physical Activity;Increase Strength and Stamina;Understanding of Exercise Prescription Increase Physical Activity;Increase Strength and Stamina;Able to understand and use Dyspnea scale;Able to understand and use rate of perceived exertion (RPE) scale;Knowledge and understanding of Target Heart Rate Range (THRR);Understanding of Exercise Prescription Increase Physical Activity;Increase Strength and Stamina;Understanding of Exercise Prescription     Comments Joe has just started PR. HE did well on his first day getting use to all the equipment and the hand weights. Will continue to montior and progress as able. Joe is just coming back from being out sick for 2 months. He didn't exercise any while he was out. He said somedays it was hard just to get out of the bed. He is eager and glad to be back and feeling better. Joe just come back from being sick for 2 months. He is getitng use to the equipment again and is starting at level 1. Will contiue to monitor and progress as able     Expected Outcomes Short: increase SPM and RPM    long: continue to attend rehab Short: Continue to attend rehab. Long: Try to incorporate some exercise at home. Short: Continue to attend rehab. Long: Try to incorporate some exercise at home.        Discharge Exercise Prescription (Final Exercise Prescription Changes):  Exercise Prescription Changes - 05/26/24 1500       Response to Exercise   Blood Pressure (Admit) 102/60    Blood Pressure (Exercise) 146/64    Blood Pressure (Exit) 92/60    Heart Rate (Admit) 106 bpm    Heart Rate (Exercise) 118 bpm    Heart Rate (Exit) 109 bpm    Oxygen  Saturation (Admit)  91 %    Oxygen   Saturation (Exercise) 90 %    Oxygen  Saturation (Exit) 93 %    Rating of Perceived Exertion (Exercise) 13    Perceived Dyspnea (Exercise) 2    Duration Continue with 30 min of aerobic exercise without signs/symptoms of physical distress.    Intensity THRR unchanged      Progression   Progression Continue to progress workloads to maintain intensity without signs/symptoms of physical distress.      Resistance Training   Training Prescription Yes    Weight 3    Reps 10-15      Oxygen    Oxygen  Continuous    Liters 3      NuStep   Level 1    SPM 94    Minutes 15    METs 2      Arm Ergometer   Level 1    Watts 58    Minutes 15    METs 2      Oxygen    Maintain Oxygen  Saturation 88% or higher          Nutrition:  Target Goals: Understanding of nutrition guidelines, daily intake of sodium 1500mg , cholesterol 200mg , calories 30% from fat and 7% or less from saturated fats, daily to have 5 or more servings of fruits and vegetables.  Biometrics:  Pre Biometrics - 03/25/24 0943       Pre Biometrics   Height 5' 8 (1.727 m)    Weight 170 lb 3.1 oz (77.2 kg)    Waist Circumference 46 inches    Hip Circumference 39 inches    Waist to Hip Ratio 1.18 %    BMI (Calculated) 25.88    Grip Strength 27.2 kg    Single Leg Stand 0 seconds           Nutrition Therapy Plan and Nutrition Goals:   Nutrition Assessments:  MEDIFICTS Score Key: >=70 Need to make dietary changes  40-70 Heart Healthy Diet <= 40 Therapeutic Level Cholesterol Diet  Flowsheet Row PULMONARY REHAB OTHER RESP ORIENTATION from 03/25/2024 in [facility] CARDIAC REHABILITATION  Picture Your Plate Total Score on Admission 65   Picture Your Plate Scores: <59 Unhealthy dietary pattern with much room for improvement. 41-50 Dietary pattern unlikely to meet recommendations for good health and room for improvement. 51-60 More healthful dietary pattern, with some room for improvement.  >60  Healthy dietary pattern, although there may be some specific behaviors that could be improved.    Nutrition Goals Re-Evaluation:  Nutrition Goals Re-Evaluation     Row Name 05/26/24 1048             Goals   Nutrition Goal Healthy diet.       Comment Joe is just now getting his appetite back from being sick. He states he didn't eat much at all when he was sick and he lost about 7-8 pounds. He is drinking and eating ok now.       Expected Outcome Short: Continue to attend rehab. Long: Continue to eat healthy and maintain weight.          Nutrition Goals Discharge (Final Nutrition Goals Re-Evaluation):  Nutrition Goals Re-Evaluation - 05/26/24 1048       Goals   Nutrition Goal Healthy diet.    Comment Joe is just now getting his appetite back from being sick. He states he didn't eat much at all when he was sick and he lost about 7-8 pounds. He is drinking and eating ok now.    Expected Outcome Short:  Continue to attend rehab. Long: Continue to eat healthy and maintain weight.          Psychosocial: Target Goals: Acknowledge presence or absence of significant depression and/or stress, maximize coping skills, provide positive support system. Participant is able to verbalize types and ability to use techniques and skills needed for reducing stress and depression.  Initial Review & Psychosocial Screening:  Initial Psych Review & Screening - 03/23/24 1331       Initial Review   Current issues with History of Depression;Current Psychotropic Meds;Current Sleep Concerns      Family Dynamics   Good Support System? Yes    Comments Patient wife and daughter support him.      Barriers   Psychosocial barriers to participate in program The patient should benefit from training in stress management and relaxation.      Screening Interventions   Interventions To provide support and resources with identified psychosocial needs;Provide feedback about the scores to participant;Encouraged  to exercise    Expected Outcomes Short Term goal: Utilizing psychosocial counselor, staff and physician to assist with identification of specific Stressors or current issues interfering with healing process. Setting desired goal for each stressor or current issue identified.;Long Term Goal: Stressors or current issues are controlled or eliminated.;Short Term goal: Identification and review with participant of any Quality of Life or Depression concerns found by scoring the questionnaire.;Long Term goal: The participant improves quality of Life and PHQ9 Scores as seen by post scores and/or verbalization of changes          Quality of Life Scores:  Scores of 19 and below usually indicate a poorer quality of life in these areas.  A difference of  2-3 points is a clinically meaningful difference.  A difference of 2-3 points in the total score of the Quality of Life Index has been associated with significant improvement in overall quality of life, self-image, physical symptoms, and general health in studies assessing change in quality of life.   PHQ-9: Review Flowsheet  More data may exist      04/28/2024 03/25/2024 08/16/2020 01/15/2018 12/18/2017  Depression screen PHQ 2/9  Decreased Interest 0 2 0 0 0  Down, Depressed, Hopeless 0 1 0 0 0  PHQ - 2 Score 0 3 0 0 0  Altered sleeping - 1 - - -  Tired, decreased energy - 1 - - -  Change in appetite - 1 - - -  Feeling bad or failure about yourself  - 1 - - -  Trouble concentrating - 1 - - -  Moving slowly or fidgety/restless - 1 - - -  Suicidal thoughts - 0 - - -  PHQ-9 Score - 9  - - -  Difficult doing work/chores - Somewhat difficult - - -    Details       Data saved with a previous flowsheet row definition        Interpretation of Total Score  Total Score Depression Severity:  1-4 = Minimal depression, 5-9 = Mild depression, 10-14 = Moderate depression, 15-19 = Moderately severe depression, 20-27 = Severe depression   Psychosocial  Evaluation and Intervention:  Psychosocial Evaluation - 03/23/24 1332       Psychosocial Evaluation & Interventions   Interventions Stress management education;Relaxation education;Encouraged to exercise with the program and follow exercise prescription    Comments Patient was referred to pulmonary rehab with COPD. He says he does have some depression/anxiety managed with Zoloft  and Alprazolam . He lives with his wife  who is his main support person along with his daughter when needed. He says he has trouble staying asleep some nights. His goals for the program are to improve his mobility and breathing. 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 He has no barriers identified to complete the program.    Expected Outcomes Short Term: Patient will start the program and attend consistently. Long Term: Patient will complete the program meeting personal goals.    Continue Psychosocial Services  Follow up required by staff          Psychosocial Re-Evaluation:  Psychosocial Re-Evaluation     Row Name 05/26/24 1047             Psychosocial Re-Evaluation   Current issues with None Identified       Comments Joe identifies no sleep or stress concerns at the moment. He has been out the last 2 months with pneumonia, so he is glad to be back and feeling better.       Expected Outcomes Short: Continue to attend rehab. Long: Continue to have healthy stress outlets.       Interventions Encouraged to attend Pulmonary Rehabilitation for the exercise       Continue Psychosocial Services  Follow up required by staff          Psychosocial Discharge (Final Psychosocial Re-Evaluation):  Psychosocial Re-Evaluation - 05/26/24 1047       Psychosocial Re-Evaluation   Current issues with None Identified    Comments Joe identifies no sleep or stress concerns at the moment. He has been out the last 2 months with pneumonia, so he is glad to be  back and feeling better.    Expected Outcomes Short: Continue to attend rehab. Long: Continue to have healthy stress outlets.    Interventions Encouraged to attend Pulmonary Rehabilitation for the exercise    Continue Psychosocial Services  Follow up required by staff           Education: Education Goals: Education classes will be provided on a weekly basis, covering required topics. Participant will state understanding/return demonstration of topics presented.  Learning Barriers/Preferences:  Learning Barriers/Preferences - 03/23/24 1330       Learning Barriers/Preferences   Learning Barriers None    Learning Preferences Skilled Demonstration;Pictoral          Education Topics: How Lungs Work and Diseases: - Discuss the anatomy of the lungs and diseases that can affect the lungs, such as COPD.   Exercise: -Discuss the importance of exercise, FITT principles of exercise, normal and abnormal responses to exercise, and how to exercise safely.   Environmental Irritants: -Discuss types of environmental irritants and how to limit exposure to environmental irritants.   Meds/Inhalers and oxygen : - Discuss respiratory medications, definition of an inhaler and oxygen , and the proper way to use an inhaler and oxygen .   Energy Saving Techniques: - Discuss methods to conserve energy and decrease shortness of breath when performing activities of daily living.    Bronchial Hygiene / Breathing Techniques: - Discuss breathing mechanics, pursed-lip breathing technique,  proper posture, effective ways to clear airways, and other functional breathing techniques   Cleaning Equipment: - Provides group verbal and written instruction about the health risks of elevated stress, cause of high stress, and healthy ways to reduce stress.   Nutrition I: Fats: - Discuss the types of cholesterol, what cholesterol does to the  body, and how cholesterol levels can be controlled.   Nutrition  II: Labels: -Discuss the different components of food labels and how to read food labels.   Respiratory Infections: - Discuss the signs and symptoms of respiratory infections, ways to prevent respiratory infections, and the importance of seeking medical treatment when having a respiratory infection.   Stress I: Signs and Symptoms: - Discuss the causes of stress, how stress may lead to anxiety and depression, and ways to limit stress.   Stress II: Relaxation: -Discuss relaxation techniques to limit stress.   Oxygen  for Home/Travel: - Discuss how to prepare for travel when on oxygen  and proper ways to transport and store oxygen  to ensure safety.   Knowledge Questionnaire Score:  Knowledge Questionnaire Score - 03/25/24 0951       Knowledge Questionnaire Score   Pre Score 15/18          Core Components/Risk Factors/Patient Goals at Admission:  Personal Goals and Risk Factors at Admission - 03/23/24 1330       Core Components/Risk Factors/Patient Goals on Admission    Weight Management Weight Maintenance    Improve shortness of breath with ADL's Yes    Intervention Provide education, individualized exercise plan and daily activity instruction to help decrease symptoms of SOB with activities of daily living.    Expected Outcomes Short Term: Improve cardiorespiratory fitness to achieve a reduction of symptoms when performing ADLs;Long Term: Be able to perform more ADLs without symptoms or delay the onset of symptoms    Increase knowledge of respiratory medications and ability to use respiratory devices properly  Yes    Intervention Provide education and demonstration as needed of appropriate use of medications, inhalers, and oxygen  therapy.    Expected Outcomes Short Term: Achieves understanding of medications use. Understands that oxygen  is a medication prescribed by physician. Demonstrates appropriate use of inhaler and oxygen  therapy.;Long Term: Maintain appropriate use of  medications, inhalers, and oxygen  therapy.    Diabetes Yes    Intervention Provide education about signs/symptoms and action to take for hypo/hyperglycemia.;Provide education about proper nutrition, including hydration, and aerobic/resistive exercise prescription along with prescribed medications to achieve blood glucose in normal ranges: Fasting glucose 65-99 mg/dL    Expected Outcomes Short Term: Participant verbalizes understanding of the signs/symptoms and immediate care of hyper/hypoglycemia, proper foot care and importance of medication, aerobic/resistive exercise and nutrition plan for blood glucose control.;Long Term: Attainment of HbA1C < 7%.    Hypertension Yes    Intervention Provide education on lifestyle modifcations including regular physical activity/exercise, weight management, moderate sodium restriction and increased consumption of fresh fruit, vegetables, and low fat dairy, alcohol moderation, and smoking cessation.;Monitor prescription use compliance.    Expected Outcomes Short Term: Continued assessment and intervention until BP is < 140/60mm HG in hypertensive participants. < 130/62mm HG in hypertensive participants with diabetes, heart failure or chronic kidney disease.;Long Term: Maintenance of blood pressure at goal levels.    Lipids Yes    Intervention Provide education and support for participant on nutrition & aerobic/resistive exercise along with prescribed medications to achieve LDL 70mg , HDL >40mg .    Expected Outcomes Short Term: Participant states understanding of desired cholesterol values and is compliant with medications prescribed. Participant is following exercise prescription and nutrition guidelines.;Long Term: Cholesterol controlled with medications as prescribed, with individualized exercise RX and with personalized nutrition plan. Value goals: LDL < 70mg , HDL > 40 mg.          Core Components/Risk Factors/Patient Goals Review:  Goals and Risk Factor Review      Row Name 05/26/24 1049             Core Components/Risk Factors/Patient Goals Review   Personal Goals Review Hypertension;Develop more efficient breathing techniques such as purse lipped breathing and diaphragmatic breathing and practicing self-pacing with activity.;Increase knowledge of respiratory medications and ability to use respiratory devices properly.;Improve shortness of breath with ADL's       Review It is Joe's first day back to rehab since he has been out sick for the last 2 months. He is glad to be back.       Expected Outcomes Short: Continue to attend rehab. Long: Continue to check vitals at home and report any abnormalities.          Core Components/Risk Factors/Patient Goals at Discharge (Final Review):   Goals and Risk Factor Review - 05/26/24 1049       Core Components/Risk Factors/Patient Goals Review   Personal Goals Review Hypertension;Develop more efficient breathing techniques such as purse lipped breathing and diaphragmatic breathing and practicing self-pacing with activity.;Increase knowledge of respiratory medications and ability to use respiratory devices properly.;Improve shortness of breath with ADL's    Review It is Joe's first day back to rehab since he has been out sick for the last 2 months. He is glad to be back.    Expected Outcomes Short: Continue to attend rehab. Long: Continue to check vitals at home and report any abnormalities.          ITP Comments:  ITP Comments     Row Name 03/23/24 1338 03/25/24 0955 03/31/24 1028 04/15/24 1532 05/13/24 0900   ITP Comments Virtual orientation visit completed for pulmonary rehab with COPD. On-site orientation visit scheduled for 03/25/24 at 8:30. Patient arrived for 1st visit/orientation/education at 0830. Patient was referred to PR by Dr. Darlean due to COPD. During orientation advised patient on arrival and appointment times what to wear, what to do before, during and after exercise. Reviewed attendance  and class policy.  Pt is scheduled to return Pulmonary Rehab on 03/31/24 at 10:30. Pt was advised to come to class 15 minutes before class starts.  Discussed RPE/Dpysnea scales. Patient participated in warm up stretches. Patient was able to complete 6 minute walk test. Patient was measured for the equipment. Discussed equipment safety with patient. Took patient pre-anthropometric measurements. Patient finished visit at 950. First full day of exercise!  Patient was oriented to gym and equipment including functions, settings, policies, and procedures.  Patient's individual exercise prescription and treatment plan were reviewed.  All starting workloads were established based on the results of the 6 minute walk test done at initial orientation visit.  The plan for exercise progression was also introduced and progression will be customized based on patient's performance and goals. 30 day review completed. ITP sent to Dr.Jehanzeb Memon, Medical Director of  Pulmonary Rehab. Continue with ITP unless changes are made by physician.    Newer to program 30 day review completed. ITP sent to Dr.Jehanzeb Memon, Medical Director of  Pulmonary Rehab. Continue with ITP unless changes are made by physician.    Newer to program, been out sick with pneumonia.    Row Name 05/19/24 1106 06/09/24 1058         ITP Comments Called to check on patient.  He is still in a lot of pain in hips and back. He is scheduled to meet with a new doctor for his pain later this week.  He  hopes to be able to return next week. 30 day review completed. ITP sent to Dr.Jehanzeb Memon, Medical Director of  Pulmonary Rehab. Continue with ITP unless changes are made by physician.         Comments: 30 day review

## 2024-06-09 NOTE — Progress Notes (Signed)
 Daily Session Note  Patient Details  Name: EBAN WEICK MRN: [id number] Date of Birth: 06-30-1956 Referring Provider:   Flowsheet Row PULMONARY REHAB OTHER RESP ORIENTATION from 03/25/2024 in [facility] CARDIAC REHABILITATION  Referring Provider Darlean Sharper MD    Encounter Date: 06/09/2024  Check In:  Session Check In - 06/09/24 1032       Check-In   Supervising physician immediately available to respond to emergencies See telemetry face sheet for immediately available MD    Location [facility]    Staff Present Powell Benders, BS, Exercise Physiologist;Brittany Jackquline, BSN, RN, WTA-C;Demonica Farrey Zina, RN    Virtual Visit No    Medication changes reported     No    Fall or balance concerns reported    No    Warm-up and Cool-down Performed on first and last piece of equipment    Resistance Training Performed Yes    VAD Patient? No    PAD/SET Patient? No      Pain Assessment   Currently in Pain? No/denies          Capillary Blood Glucose: No results found for this or any previous visit (from the past 24 hours).    Social History   Tobacco Use  Smoking Status Former   Current packs/day: 0.00   Average packs/day: 1.3 packs/day for 36.0 years (45.0 ttl pk-yrs)   Types: Cigarettes   Start date: 04/1975   Quit date: 04/2011   Years since quitting: 13.1  Smokeless Tobacco Never    Goals Met:  Proper associated with RPD/PD & O2 Sat Independence with exercise equipment Using PLB without cueing & demonstrates good technique Exercise tolerated well No report of concerns or symptoms today Strength training completed today  Goals Unmet:  Not Applicable  Comments: Pt able to follow exercise prescription today without complaint.  Will continue to monitor for progression.

## 2024-06-11 ENCOUNTER — Encounter (HOSPITAL_COMMUNITY)
Admission: RE | Admit: 2024-06-11 | Discharge: 2024-06-11 | Disposition: A | Source: Ambulatory Visit | Attending: Internal Medicine | Admitting: Internal Medicine

## 2024-06-11 DIAGNOSIS — J449 Chronic obstructive pulmonary disease, unspecified: Secondary | ICD-10-CM | POA: Diagnosis not present

## 2024-06-11 NOTE — Progress Notes (Signed)
 Daily Session Note  Patient Details  Name: [name] MRN: [id number] Date of Birth: 1956-03-13 Referring Provider:   Flowsheet Row PULMONARY REHAB OTHER RESP ORIENTATION from 03/25/2024 in [facility] CARDIAC REHABILITATION  Referring Provider Darlean Sharper MD    Encounter Date: 06/11/2024  Check In:  Session Check In - 06/11/24 1030       Check-In   Supervising physician immediately available to respond to emergencies See telemetry face sheet for immediately available MD    Location [facility]    Staff Present Powell Benders, BS, Exercise Physiologist;Jessica Hazleton, MA, RCEP, CCRP, Sueellen Louder, RN, Engineer, Mining, RN    Virtual Visit No    Medication changes reported     No    Fall or balance concerns reported    No    Tobacco Cessation No Change    Warm-up and Cool-down Performed on first and last piece of equipment    Resistance Training Performed Yes    VAD Patient? No    PAD/SET Patient? No      Pain Assessment   Currently in Pain? No/denies    Pain Score 0-No pain    Multiple Pain Sites No          Capillary Blood Glucose: No results found for this or any previous visit (from the past 24 hours).    Social History   Tobacco Use  Smoking Status Former   Current packs/day: 0.00   Average packs/day: 1.3 packs/day for 36.0 years (45.0 ttl pk-yrs)   Types: Cigarettes   Start date: 04/1975   Quit date: 04/2011   Years since quitting: 13.1  Smokeless Tobacco Never    Goals Met:  Proper associated with RPD/PD & O2 Sat Independence with exercise equipment Using PLB without cueing & demonstrates good technique Exercise tolerated well Queuing for purse lip breathing No report of concerns or symptoms today Strength training completed today  Goals Unmet:  Not Applicable  Comments: .Pt able to follow exercise prescription today without complaint.  Will continue to monitor for progression.

## 2024-06-16 ENCOUNTER — Encounter (HOSPITAL_COMMUNITY)

## 2024-06-17 ENCOUNTER — Ambulatory Visit: Admitting: Internal Medicine

## 2024-06-17 ENCOUNTER — Encounter: Payer: Self-pay | Admitting: Internal Medicine

## 2024-06-17 VITALS — BP 96/56 | HR 125 | Ht 68.0 in | Wt 169.0 lb

## 2024-06-17 DIAGNOSIS — R918 Other nonspecific abnormal finding of lung field: Secondary | ICD-10-CM | POA: Diagnosis not present

## 2024-06-17 DIAGNOSIS — Z87891 Personal history of nicotine dependence: Secondary | ICD-10-CM

## 2024-06-17 DIAGNOSIS — J9611 Chronic respiratory failure with hypoxia: Secondary | ICD-10-CM | POA: Diagnosis not present

## 2024-06-17 DIAGNOSIS — J449 Chronic obstructive pulmonary disease, unspecified: Secondary | ICD-10-CM

## 2024-06-17 NOTE — Patient Instructions (Addendum)
 Keep up with Dr Karis regarding your nasal congestion - if it's too severe you may need to chang to 02 mask at times to be sure the 02 gets thru  Make sure you check your oxygen  saturation  AT  your highest level of activity (not after you stop)   to be sure it stays over 90% and adjust  02 flow upward to maintain this level if needed but remember to turn it back to previous settings when you stop (to conserve your supply).   Also  Ok to try albuterol  15 min before an activity (on alternating days between the nebulizer and inhaler and nothing but breztri  as usual)  that you know would usually make you short of breath and see if it makes any difference and if makes none then don't take albuterol  after activity unless you can't catch your breath as this means it's the resting that helps, not the albuterol .   Please schedule a follow up visit in 6 months but call sooner if needed

## 2024-06-17 NOTE — Progress Notes (Signed)
 [name], male    DOB: May 11, 1956     MRN: [id number]   Brief patient profile:  61 yowm MM/quit smoking 2012 with COPD GOLD 3 criteria 06/17/20 previously followed by Dr Vonzell referred back to pulmonary clinic 12/09/2019 in [place].   History of Present Illness  12/09/2019  Pulmonary/ 1st office eval/[name]  symbicort  160/spiriva   Chief Complaint  Patient presents with   Pulmonary Consult    Former patient of Dr Vonzell.  Breathing has been worse over the past few wks. Recently treated with Doxy per PCP for COPD exacerbation. He is occ coughing up some green sputum.   flare 11/23/19  rx doxy 5/19 x 7 days  Dyspnea:  Baseline uses HC parking / [employer] = can't walk 100 yards even at a slow pace at a flat grade s stopping due to sob  On 2lpm  Cough: improved less purulent purulent  Sleep: wakes up coughing since onset /sometimes uses saba proair   SABA use: rarely daytime rec Continue protonix  40 mg Take 30-60 min before first meal of the day  GERD diet  Plan A = Automatic = Always=    Budesonide /formoterol  (symbicort  ) 2 puffs first thing in am and 12 hours later and use spiriva  x 2 pffs after the morning symbicort  (ok to change back to the powder after 2 weeks but will need to stop it if you start coughing  Work on inhaler technique  Plan B = Backup (to supplement plan A, not to replace it) Only use your albuterol  inhaler as a rescue medication    Plan C = Crisis (instead of Plan B   Make sure you check your oxygen  saturations at highest level of activity to be sure it stays over 90%   Prednisone  10 mg take  4 each am x 2 days,   2 each am x 2 days,  1 each am x 2 days and stop  For cough > mucinex  dm 1200 mg every 12 hours as needed  Would wait another 2 weeks before you get your first shot for covid 19    [name] eval 08/01/20 rec ST eval done 08/05/20: low risk asp SLP Diet Recommendations Regular solids;Thin liquid  Liquid Administration via Straw;Cup  Medication Administration  Whole meds with liquid  Compensations Slow rate;Small sips/bites  Postural Changes Seated upright at 90 degrees;Remain semi-upright after after feeds/meals (Comment)     [name] 03/02/22 FESS     Admission date:  06/08/2023     Discharge Date:  06/11/2023    Discharge Diagnosis  Acute on chronic respiratory failure with hypoxia ([name]) [J96.21] Pneumonia due to infectious organism, unspecified laterality, unspecified part of lung [J18.9]     Principal Problem:   Acute on chronic respiratory failure with hypoxia ([name])   Gastroesophageal reflux disease   HTN (hypertension)   Type 2 diabetes mellitus without complication, without long-term current use of insulin  ([name])   COPD with acute exacerbation ([name])   CAP (community acquired pneumonia)   AKI (acute kidney injury) ([name])     Never got back to baseline since d/c 06/11/23 in terms of sob/cough   01/28/2024  f/u ov/Cicilia Clinger re: GOLD 3 copd/ 02 dep maint on breztri   /flutter  Chief Complaint  Patient presents with   Follow-up   COPD  Dyspnea:  [employer] now riding scooter - pred helps the most  Cough: brown mucus worse in am / using mucinex  / flutter valve - just finishing course of levaqui Sleeping: hob  up 30 degrees s noct  resp cc on L side down  SABA use: up to twice daily neb 02: 3pm  Lung cancer screening :  10/14/23  RADS 2/emphysema  Rec I would strongly advise a trial off of Actos due to tendency to fluid retention  Work on inhaler technique:  Plan A = Automatic = Always=    Breztri  Take 2 puffs first thing in am and then another 2 puffs about 12 hours later.  Add  ohtuvayre  nebulizer first thing in am and again in evening  Plan B = Backup (to supplement plan A, not to replace it) Use your albuterol  inhaler as a rescue medication Plan C = Crisis (instead of Plan B but only if Plan B stops working) - only use your albuterol  nebulizer if you first try Plan B Plan D = Deltasone   if ABC not working  - prednisone   10 mg x 2 until  better then 1 daily x 5 days and stop  Make sure you check your oxygen  saturation  AT  your highest level of activity (not after you stop)   to be sure it stays over 90% .  Referring you to pulmonary rehab at [facility].      03/18/2024  f/u ov/[place] office/[name] re: GOLD 3 copd /02 dep  maint on breztri  / pred 20 mg x one week flare of green cough  / nasal congestion / has not started pulm rehab yet  Chief Complaint  Patient presents with   COPD    Coughing green mucus  Dyspnea:  [employer] walking / HC walking  Cough: green thick x 2 weeks also assoc with nasal congestion  Sleeping: bed blocks and 2 pillows s   resp cc  SABA use: once or twice day / once a week  02: 3lpm 24/7  LCS :  due for LDSCT q April  Patient Instructions  My office will be contacting you by phone for referral to [facility] for pulmonary rehab  Make sure you check your oxygen  saturation  AT  your highest level of activity (not after you stop)   to be sure it stays over 90%  Augmentin  875 mg take one pill twice daily  X 10 days  Also  Ok to try albuterol  15 min before an activity (on alternating days with nebulizer)  that you know would usually make you short of breath Try off gabapentin  for a week to see if heps your leg swelling  For cough/ congestion > mucinex  or mucinex  dm  up to maximum of  1200 mg every 12 hours and use the flutter valve as much as you can    Please schedule a follow up visit in 3 months but call sooner if needed    06/17/2024  f/u ov/ office/[name] re:  GOLD 3 copd /02 dep maint on breztri  no prednisone  x 2 weeeks   Chief Complaint  Patient presents with   COPD    DOE    Dyspnea:  doing pulmonary rehab on 3lpm  Cough: none  Sleeping:bed blocks / pillow s resp cc  SABA use: 2-3 x per month/ none  02: 3lpm 24/7   Lung cancer screening: due q April    No obvious day to day or daytime variability or assoc excess/ purulent sputum or mucus plugs or hemoptysis or cp or chest tightness,  subjective wheeze or overt sinus or hb symptoms.    Also denies any obvious fluctuation of symptoms with weather or environmental  changes or other aggravating or alleviating factors except as outlined above   No unusual exposure hx or h/o childhood pna/ asthma or knowledge of premature birth.  Current Allergies, Complete Past Medical History, Past Surgical History, Family History, and Social History were reviewed in [employer] record.  ROS  The following are not active complaints unless bolded Hoarseness, sore throat, dysphagia, dental problems, itching, sneezing,  nasal congestion or discharge of excess mucus or purulent secretions, ear ache,   fever, chills, sweats, unintended wt loss or wt gain, classically pleuritic or exertional cp,  orthopnea pnd or arm/hand swelling  or leg swelling, presyncope, palpitations, abdominal pain, anorexia, nausea, vomiting, diarrhea  or change in bowel habits or change in bladder habits, change in stools or change in urine, dysuria, hematuria,  rash, arthralgias, visual complaints, headache, numbness, weakness or ataxia or problems with walking or coordination,  change in mood or  memory.        Current Meds  Medication Sig   acetaminophen  (TYLENOL ) 325 MG tablet Take 2 tablets (650 mg total) by mouth every 6 (six) hours as needed for mild pain (pain score 1-3) (or Fever >/= 101).   albuterol  (PROVENTIL ) (2.5 MG/3ML) 0.083% nebulizer solution Take 3 mLs (2.5 mg total) by nebulization every 2 (two) hours as needed for wheezing or shortness of breath (cough).   albuterol  (VENTOLIN  HFA) 108 (90 Base) MCG/ACT inhaler Inhale 2 puffs into the lungs every 4 (four) hours as needed for wheezing or shortness of breath.   ALPRAZolam  (XANAX ) 0.25 MG tablet Take 0.25 mg by mouth 2 (two) times daily as needed for sleep or anxiety.   aspirin  EC 81 MG tablet Take 81 mg by mouth daily. Swallow whole.   atorvastatin  (LIPITOR) 40 MG tablet Take 40 mg by  mouth in the morning.   BREZTRI  AEROSPHERE 160-9-4.8 MCG/ACT AERO inhaler INHALE 2 PUFFS INTO THE LUNGS TWICE A DAY   Cholecalciferol  (VITAMIN D3) 25 MCG (1000 UT) CAPS Take 1,000 Units by mouth in the morning.   diltiazem  (CARDIZEM  CD) 240 MG 24 hr capsule Take 240 mg by mouth daily.   EMBECTA PEN NEEDLE ULTRAFINE 31G X 5 MM MISC    famotidine  (PEPCID ) 20 MG tablet TAKE 1 TABLET BY MOUTH EVERY DAY AFTER SUPPER   fluticasone  (FLONASE ) 50 MCG/ACT nasal spray Place 1 spray into both nostrils daily as needed for allergies or rhinitis.   furosemide  (LASIX ) 40 MG tablet Take 1 tablet (40 mg total) by mouth daily in the morning and take 1/2 tablet (20 mg total) by mouth daily in the afternoon.   gabapentin  (NEURONTIN ) 300 MG capsule Take 1 capsule (300 mg total) by mouth at bedtime.   glimepiride  (AMARYL ) 1 MG tablet Take 1 mg by mouth daily with breakfast.   insulin  glargine, 2 Unit Dial, (TOUJEO  MAX) 300 UNIT/ML Solostar Pen Inject into the skin.   metFORMIN  (GLUCOPHAGE ) 1000 MG tablet Take 1,000 mg by mouth daily with supper.   Multiple Vitamin (MULTIVITAMIN WITH MINERALS) TABS tablet Take 1 tablet by mouth at bedtime.   OXYGEN  Inhale 4 L into the lungs continuous.   pantoprazole  (PROTONIX ) 40 MG tablet TAKE 1 TABLET (40 MG TOTAL) BY MOUTH IN THE MORNING   pioglitazone (ACTOS) 15 MG tablet Take 15 mg by mouth daily.   predniSONE  (DELTASONE ) 20 MG tablet Take 20 mg by mouth 2 (two) times daily.   pyridOXINE (B-6) 50 MG tablet Take 50 mg by mouth daily.   sertraline  (ZOLOFT ) 50  MG tablet Take 1 tablet (50 mg total) by mouth daily.   tamsulosin  (FLOMAX ) 0.4 MG CAPS capsule Take 0.4 mg by mouth in the morning.   vitamin C  (ASCORBIC ACID ) 250 MG tablet Take 250 mg by mouth in the morning.   Wheat Dextrin (BENEFIBER PO) Take 1 Capful by mouth daily.           Past Medical History:  Diagnosis Date   Anxiety    Asthma    Chest pain    a. 2003 Cath: nl cors.   COPD (chronic obstructive pulmonary  disease) ([name])    Depression    Diabetes mellitus    Dyslipidemia    Dysrhythmia    Fracture 12/15   lower back L-1   GERD (gastroesophageal reflux disease)    Hypertension    PONV (postoperative nausea and vomiting)    Splenomegaly        Objective:    Wt   06/17/2024       169  03/18/2024         173  01/28/2024         166  10/10/2023          168 08/08/2023        167  07/26/2023        162  05/01/2023      170   03/07/2023        167   09/06/2022        163   04/05/2022        156 12/08/2021          164 09/01/2021        167  06/06/2021      162 12/08/2020          163 09/15/2020        154  05/23/2020      166 04/28/2020      166   12/30/19 162 lb (73.5 kg)  12/21/19 162 lb (73.5 kg)  12/09/19 162 lb (73.5 kg)    Vital signs reviewed  06/17/2024  - Note at rest 02 sats  89% on 3lpm    General appearance:    amb wm/ nasal tone to voice   HEENT :  Oropharynx  clear   Nasal turbinates mod severe edema s polyps or purulence    NECK :  without JVD/Nodes/TM/ nl carotid upstrokes bilaterally   LUNGS: no acc muscle use,  Mod barrel  contour chest wall with bilateral  Distant bs s audible wheeze and  without cough on insp or exp maneuvers and mod  Hyperresonant  to  percussion bilaterally     CV:  RRR  no s3 or murmur or increase in P2, and no edema   ABD:  soft and nontender with pos mid insp Hoover's  in the supine position. No bruits or organomegaly appreciated, bowel sounds nl  MS:   Ext warm without deformities or   obvious joint restrictions , calf tenderness, cyanosis or clubbing  SKIN: warm and dry without lesions    NEURO:  alert, approp, nl sensorium with  no motor or cerebellar deficits apparent.           Assessment   Assessment & Plan COPD mixed type ([name]) Quit smoking 2012/MM - alpha one AT screen  01/31/17  MM  Level 169 - PFT's  06/18/2019  FEV1 1.12 (35 % ) ratio 0.46  p 0 % improvement from saba p ? prior to study with  DLCO  11.91 (48%)  corrects to 3.18 (754%)  for alv volume and FV curve classic concavity   - [name] 03/02/22 FESS  - 03/07/2023  continue breztri  - 05/01/2023    continue breztri  and approp saba  -01/28/2024  After extensive coaching inhaler device,  effectiveness =    75% (Ti short) for hfa - 01/28/2024 Ohtuvayre  ordered/ rec Pulmonary rehab due to refractory doe/pred dependency> could not afford so rx prednisone  as PLAN D  and referred to Pulmonary rehab  - 03/18/2024  After extensive coaching inhaler device,  effectiveness =    80% hfa  from a baseline of 50%  (short ti)  - 03/18/2024 trained on Flutter valve    Group E in terms of symptoms/risk so  laba/lama/ICS  therefore appropriate rx at this point >>>  breztri / prn prednisone    and approp SABA prn >>> no change maint rx needed - continue to minimize prednisone  rx   >>>  Re SABA :  I spent extra time with pt today reviewing appropriate use of albuterol  for prn use on exertion with the following points: 1) saba is for relief of sob that does not improve by walking a slower pace or resting but rather if the pt does not improve after trying this first. 2) If the pt is convinced, as many are, that saba helps recover from activity faster then it's easy to tell if this is the case by re-challenging : ie stop, take the inhaler, then p 5 minutes try the exact same activity (intensity of workload) that just caused the symptoms and see if they are substantially diminished or not after saba 3) if there is an activity that reproducibly causes the symptoms, try the saba 15 min before the activity on alternate days   If in fact the saba really does help, then fine to continue to use it prn but advised may need to look closer at the maintenance regimen being used to achieve better control of airways disease with exertion.    Chronic respiratory failure with hypoxia ([name]) Started on 02 around 2019 As of 02/03/2020  2lpm 24/7  -  02/03/2020 referred to adapt for Best fit for amb 02  > 3lpm pulsed tank -  04/28/2020   Walked 3lpm pulsed tank  approx   400 ft  @ moderate pace  stopped due to  Sob/tired  with sats still 95%    -  09/15/2020   Walked 3lpm cont approx   200 ft  @ slow/slt undsteady  pace  stopped due to  Sob/fatigue   With sats 91% at rest sats were 86% RA   12/08/2020   Walked RA  approx   500 ft  @ moderate pace  stopped due to  End of study, a little sob, sats still 90%  - 04/05/2022 patient walked at a moderate pace on room air all three laps @ 150 ft each. Desat to 87% at end of third lap. Patient placed back on 3LO2 cont at end of walk  - 09/06/2022   Walked on 2lpm cont   x  3  lap(s) =  approx 450  ft  @ moderate pace, stopped due to end of study  with lowest 02 sats 93%   - 03/07/2023   Walked on 3lpm   x  2  lap(s) =  approx 300  ft  @ nl  pace, stopped due to sob with lowest 02 sats 88%   -  05/01/2023  Walked on 2lpom con  x  3  lap(s) =  approx 450  ft  @ mod to slow pace, stopped due to sob  with lowest 02 sats 91%    Again advised: Make sure you check your oxygen  saturation  AT  your highest level of activity (not after you stop)   to be sure it stays over 90% and adjust  02 flow upward to maintain this level if needed but remember to turn it back to previous settings when you stop (to conserve your supply).  >>> f/u rhinitis with Karis as may be limited in terms of 02 delivery per Nasal prongs  Multiple pulmonary nodules determined by computed tomography of lung F/u q April per LDS  program/ currently RADS 2  >>> continue annual f/u          Each maintenance medication was reviewed in detail including emphasizing most importantly the difference between maintenance and prns and under what circumstances the prns are to be triggered using an action plan format where appropriate.  Total time for H and P, chart review, counseling, reviewing hfa/ neb/ flutter 02 /pulse ox  device(s) and generating customized AVS unique to this office visit / same day  charting = 34 mn         AVS  Patient Instructions  Keep up with Dr Karis regarding your nasal congestion - if it's too severe you may need to chang to 02 mask at times to be sure the 02 gets thru  Make sure you check your oxygen  saturation  AT  your highest level of activity (not after you stop)   to be sure it stays over 90% and adjust  02 flow upward to maintain this level if needed but remember to turn it back to previous settings when you stop (to conserve your supply).   Also  Ok to try albuterol  15 min before an activity (on alternating days between the nebulizer and inhaler and nothing but breztri  as usual)  that you know would usually make you short of breath and see if it makes any difference and if makes none then don't take albuterol  after activity unless you can't catch your breath as this means it's the resting that helps, not the albuterol .   Please schedule a follow up visit in 6 months but call sooner if needed        Ozell America, MD 06/18/2024

## 2024-06-18 ENCOUNTER — Encounter (HOSPITAL_COMMUNITY)
Admission: RE | Admit: 2024-06-18 | Discharge: 2024-06-18 | Disposition: A | Discharge status: Home / Self Care | Source: Ambulatory Visit | Attending: Internal Medicine | Admitting: Internal Medicine

## 2024-06-18 DIAGNOSIS — J449 Chronic obstructive pulmonary disease, unspecified: Secondary | ICD-10-CM | POA: Diagnosis not present

## 2024-06-18 NOTE — Progress Notes (Signed)
 Daily Session Note  Patient Details  Name: [name] MRN: [id number] Date of Birth: October 21, 1955 Referring Provider:   Flowsheet Row PULMONARY REHAB OTHER RESP ORIENTATION from 03/25/2024 in [facility]  Referring Provider Darlean Sharper MD    Encounter Date: 06/18/2024  Check In:  Session Check In - 06/18/24 1015       Check-In   Supervising physician immediately available to respond to emergencies See telemetry face sheet for immediately available MD    Location [facility]    Staff Present Adrien Louder, RN, BSN;Hillary Troutman BSN, RN;Heather Con, BS, Exercise Physiologist    Virtual Visit No    Medication changes reported     No    Fall or balance concerns reported    No    Warm-up and Cool-down Performed on first and last piece of equipment    Resistance Training Performed Yes    VAD Patient? No    PAD/SET Patient? No      Pain Assessment   Currently in Pain? No/denies    Pain Score 0-No pain    Multiple Pain Sites No          Capillary Blood Glucose: No results found for this or any previous visit (from the past 24 hours).    Tobacco Use History[1]  Goals Met:  Proper associated with RPD/PD & O2 Sat Independence with exercise equipment Using PLB without cueing & demonstrates good technique Exercise tolerated well No report of concerns or symptoms today Strength training completed today  Goals Unmet:  Not Applicable  Comments: Pt able to follow exercise prescription today without complaint.  Will continue to monitor for progression.        [1]  Social History Tobacco Use  Smoking Status Former   Current packs/day: 0.00   Average packs/day: 1.3 packs/day for 36.0 years (45.0 ttl pk-yrs)   Types: Cigarettes   Start date: 04/1975   Quit date: 04/2011   Years since quitting: 13.2  Smokeless Tobacco Never

## 2024-06-18 NOTE — Assessment & Plan Note (Addendum)
 F/u q April per LDS  program/ currently RADS 2  >>> continue annual f/u          Each maintenance medication was reviewed in detail including emphasizing most importantly the difference between maintenance and prns and under what circumstances the prns are to be triggered using an action plan format where appropriate.  Total time for H and P, chart review, counseling, reviewing hfa/ neb/ flutter 02 /pulse ox  device(s) and generating customized AVS unique to this office visit / same day charting = 34 mn

## 2024-06-18 NOTE — Assessment & Plan Note (Addendum)
 Started on 02 around 2019 As of 02/03/2020  2lpm 24/7  -  02/03/2020 referred to adapt for Best fit for amb 02 > 3lpm pulsed tank -  04/28/2020   Walked 3lpm pulsed tank  approx   400 ft  @ moderate pace  stopped due to  Sob/tired  with sats still 95%    -  09/15/2020   Walked 3lpm cont approx   200 ft  @ slow/slt undsteady  pace  stopped due to  Sob/fatigue   With sats 91% at rest sats were 86% RA   12/08/2020   Walked RA  approx   500 ft  @ moderate pace  stopped due to  End of study, a little sob, sats still 90%  - 04/05/2022 patient walked at a moderate pace on room air all three laps @ 150 ft each. Desat to 87% at end of third lap. Patient placed back on 3LO2 cont at end of walk  - 09/06/2022   Walked on 2lpm cont   x  3  lap(s) =  approx 450  ft  @ moderate pace, stopped due to end of study  with lowest 02 sats 93%   - 03/07/2023   Walked on 3lpm   x  2  lap(s) =  approx 300  ft  @ nl  pace, stopped due to sob with lowest 02 sats 88%   -  05/01/2023   Walked on 2lpom con  x  3  lap(s) =  approx 450  ft  @ mod to slow pace, stopped due to sob  with lowest 02 sats 91%    Again advised: Make sure you check your oxygen  saturation  AT  your highest level of activity (not after you stop)   to be sure it stays over 90% and adjust  02 flow upward to maintain this level if needed but remember to turn it back to previous settings when you stop (to conserve your supply).  >>> f/u rhinitis with Karis as may be limited in terms of 02 delivery per Nasal prongs

## 2024-06-18 NOTE — Assessment & Plan Note (Addendum)
 Quit smoking 2012/MM - alpha one AT screen  01/31/17  MM  Level 169 - PFT's  06/18/2019  FEV1 1.12 (35 % ) ratio 0.46  p 0 % improvement from saba p ? prior to study with DLCO  11.91 (48%) corrects to 3.18 (754%)  for alv volume and FV curve classic concavity   - [name] 03/02/22 FESS  - 03/07/2023  continue breztri  - 05/01/2023    continue breztri  and approp saba  -01/28/2024  After extensive coaching inhaler device,  effectiveness =    75% (Ti short) for hfa - 01/28/2024 Ohtuvayre  ordered/ rec Pulmonary rehab due to refractory doe/pred dependency> could not afford so rx prednisone  as PLAN D  and referred to Pulmonary rehab  - 03/18/2024  After extensive coaching inhaler device,  effectiveness =    80% hfa  from a baseline of 50%  (short ti)  - 03/18/2024 trained on Flutter valve    Group E in terms of symptoms/risk so  laba/lama/ICS  therefore appropriate rx at this point >>>  breztri / prn prednisone    and approp SABA prn >>> no change maint rx needed - continue to minimize prednisone  rx   >>>  Re SABA :  I spent extra time with pt today reviewing appropriate use of albuterol  for prn use on exertion with the following points: 1) saba is for relief of sob that does not improve by walking a slower pace or resting but rather if the pt does not improve after trying this first. 2) If the pt is convinced, as many are, that saba helps recover from activity faster then it's easy to tell if this is the case by re-challenging : ie stop, take the inhaler, then p 5 minutes try the exact same activity (intensity of workload) that just caused the symptoms and see if they are substantially diminished or not after saba 3) if there is an activity that reproducibly causes the symptoms, try the saba 15 min before the activity on alternate days   If in fact the saba really does help, then fine to continue to use it prn but advised may need to look closer at the maintenance regimen being used to achieve better control of  airways disease with exertion.

## 2024-06-23 ENCOUNTER — Encounter (HOSPITAL_COMMUNITY)

## 2024-06-25 ENCOUNTER — Encounter (HOSPITAL_COMMUNITY)

## 2024-06-25 DIAGNOSIS — J449 Chronic obstructive pulmonary disease, unspecified: Secondary | ICD-10-CM | POA: Diagnosis not present

## 2024-06-25 NOTE — Progress Notes (Signed)
 Daily Session Note  Patient Details  Name: JAMARIUS SAHA MRN: [id number] Date of Birth: 07-24-1955 Referring Provider:   Flowsheet Row PULMONARY REHAB OTHER RESP ORIENTATION from 03/25/2024 in [facility] CARDIAC REHABILITATION  Referring Provider Darlean Sharper MD    Encounter Date: 06/25/2024  Check In:  Session Check In - 06/25/24 1029       Check-In   Supervising physician immediately available to respond to emergencies See telemetry face sheet for immediately available MD    Location [facility]    Staff Present Harlene Gelineau, MA, RCEP, CCRP, CCET;Heather Con HECKLE, Exercise Physiologist;Patsy Zaragoza Dean BSN, RN    Virtual Visit No    Medication changes reported     No    Fall or balance concerns reported    No    Tobacco Cessation No Change    Warm-up and Cool-down Performed on first and last piece of equipment    Resistance Training Performed Yes    VAD Patient? No    PAD/SET Patient? No      Pain Assessment   Currently in Pain? No/denies    Pain Score 0-No pain    Multiple Pain Sites No          Capillary Blood Glucose: No results found for this or any previous visit (from the past 24 hours).    Tobacco Use History[1]  Goals Met:  Proper associated with RPD/PD & O2 Sat Independence with exercise equipment Using PLB without cueing & demonstrates good technique Exercise tolerated well Queuing for purse lip breathing No report of concerns or symptoms today Strength training completed today  Goals Unmet:  Not Applicable  Comments: .Pt able to follow exercise prescription today without complaint.  Will continue to monitor for progression.       [1]  Social History Tobacco Use  Smoking Status Former   Current packs/day: 0.00   Average packs/day: 1.3 packs/day for 36.0 years (45.0 ttl pk-yrs)   Types: Cigarettes   Start date: 04/1975   Quit date: 04/2011   Years since quitting: 13.2  Smokeless Tobacco Never

## 2024-06-26 NOTE — Telephone Encounter (Signed)
"      °  05/07/2024 11:08 AM EDT by Zina Richerd DASEN, RN  Gwenetta Ada, REIS GOGA (Self) [phone] (Mobile) Remove  Left Message - Missed rehab today. No answer, left VM.   "

## 2024-06-30 ENCOUNTER — Encounter (HOSPITAL_COMMUNITY)
Admission: RE | Admit: 2024-06-30 | Discharge: 2024-06-30 | Disposition: A | Discharge status: Home / Self Care | Source: Ambulatory Visit | Attending: Internal Medicine | Admitting: Internal Medicine

## 2024-06-30 DIAGNOSIS — J449 Chronic obstructive pulmonary disease, unspecified: Secondary | ICD-10-CM | POA: Diagnosis not present

## 2024-06-30 NOTE — Progress Notes (Signed)
 Daily Session Note  Patient Details  Name: [name] MRN: [id number] Date of Birth: 11/30/1955 Referring Provider:   Flowsheet Row PULMONARY REHAB OTHER RESP ORIENTATION from 03/25/2024 in [facility] CARDIAC REHABILITATION  Referring Provider Darlean Sharper MD    Encounter Date: 06/30/2024  Check In:  Session Check In - 06/30/24 1015       Check-In   Supervising physician immediately available to respond to emergencies See telemetry face sheet for immediately available MD    Location [facility]    Staff Present Adrien Louder, RN, BSN;Heather Con, BS, Exercise Physiologist;Jessica Vonzell, MA, RCEP, CCRP, CCET    Virtual Visit No    Medication changes reported     No    Fall or balance concerns reported    No    Warm-up and Cool-down Performed on first and last piece of equipment    Resistance Training Performed Yes    VAD Patient? No    PAD/SET Patient? No      Pain Assessment   Currently in Pain? No/denies    Pain Score 0-No pain    Multiple Pain Sites No          Capillary Blood Glucose: No results found for this or any previous visit (from the past 24 hours).    Tobacco Use History[1]  Goals Met:  Proper associated with RPD/PD & O2 Sat Independence with exercise equipment Using PLB without cueing & demonstrates good technique Exercise tolerated well No report of concerns or symptoms today Strength training completed today  Goals Unmet:  Not Applicable  Comments: Pt able to follow exercise prescription today without complaint.  Will continue to monitor for progression.        [1]  Social History Tobacco Use  Smoking Status Former   Current packs/day: 0.00   Average packs/day: 1.3 packs/day for 36.0 years (45.0 ttl pk-yrs)   Types: Cigarettes   Start date: 04/1975   Quit date: 04/2011   Years since quitting: 13.2  Smokeless Tobacco Never

## 2024-07-06 NOTE — Progress Notes (Unsigned)
 "  CC: Leaking urine   HPI:    PMH: Past Medical History:  Diagnosis Date   Anxiety    Arthritis    Asthma    Chest pain    a. 2003 Cath: nl cors.   Cirrhosis ([name])    secondary to MASH   Concussion    COPD (chronic obstructive pulmonary disease) ([name])    chronically on 3-4L [place]   Depression    Diabetes mellitus    Dyslipidemia    Tachycardia   Dyspnea    Dysrhythmia    Fracture 06/2014   lower back L-1   GERD (gastroesophageal reflux disease)    Hypertension    NASH (nonalcoholic steatohepatitis) 2018   [name], undergoing routine cirrhosis care; [name]   Pneumonia 12/01/2021   PONV (postoperative nausea and vomiting)    Splenomegaly    Thrombocytopenia     Surgical History: Past Surgical History:  Procedure Laterality Date   BIOPSY  07/26/2016   Procedure: BIOPSY;  Surgeon: Lamar CHRISTELLA Hollingshead, MD;  [facility]: [facility];  Service: Endoscopy;;  gastric   CARDIAC CATHETERIZATION  10/22/2001   normal coronary arteriea (Dr. MICAEL Ona)   COLONOSCOPY  08/31/2011   MFM:Floupeoz rectal and colon polyps-treated. Single tubular adenoma. Next TCS 08/2016   COLONOSCOPY N/A 07/26/2016   Dr. Hollingshead: diverticulosis in sigmoid and descending colon. 6 mm benign splenic flexure. surveillance 5 yeras   COLONOSCOPY WITH PROPOFOL  N/A 08/07/2021   Surgeon: Cindie Dunnings K, DO;  Nonbleeding internal hemorrhoids, three 3-6 mm polyps in the transverse colon resected and retrieved, otherwise normal exam.  Pathology with 1 tubular adenoma, otherwise benign colonic mucosa.  Recommended 5-year repeat.   ESOPHAGOGASTRODUODENOSCOPY N/A 07/26/2016   Dr. Hollingshead: normal esophagus, portal gastropathy, chronic gastritis, normal duodenum, screening EGD 2 years    ESOPHAGOGASTRODUODENOSCOPY (EGD) WITH ESOPHAGEAL DILATION N/A 05/15/2013   MFM:Wnmfjo esophagus-status post Agapito dilation/Portal gastropathy. Antral erosions-status post biopsy. extrinsic  compression along lesser curvature likely secondary to splenomegaly. gastric bx benign   ESOPHAGOGASTRODUODENOSCOPY (EGD) WITH PROPOFOL  N/A 12/12/2020   Surgeon: Hollingshead Lamar CHRISTELLA, MD; ormal esophagus s/p dilation, portal hypertensive gastropathy.  Due for repeat in 2024.   ESOPHAGOGASTRODUODENOSCOPY (EGD) WITH PROPOFOL  N/A 05/22/2023   Procedure: ESOPHAGOGASTRODUODENOSCOPY (EGD) WITH PROPOFOL ;  Surgeon: Hollingshead Lamar CHRISTELLA, MD;  [facility];  Service: Endoscopy;  Laterality: N/A;  9:30 am, asa 3   ETHMOIDECTOMY Bilateral 03/02/2022   Procedure: ETHMOIDECTOMY;  Surgeon: Karis Clunes, MD;  Location[facility] OR;  Service: ENT;  Laterality: Bilateral;   HERNIA REPAIR  03/2009   MALONEY DILATION N/A 12/12/2020   Procedure: AGAPITO DILATION;  Surgeon: Hollingshead Lamar CHRISTELLA, MD;  [facility];  Service: Endoscopy;  Laterality: N/A;   MAXILLARY ANTROSTOMY Bilateral 03/02/2022   Procedure: MAXILLARY ANTROSTOMY;  Surgeon: Karis Clunes, MD;  Location: [facility] OR;  Service: ENT;  Laterality: Bilateral;   NASAL SEPTOPLASTY W/ TURBINOPLASTY Bilateral 03/02/2022   Procedure: NASAL SEPTOPLASTY WITH TURBINATE REDUCTION;  Surgeon: Karis Clunes, MD;  Location: [facility] OR;  Service: ENT;  Laterality: Bilateral;   POLYPECTOMY  07/26/2016   Procedure: POLYPECTOMY;  Surgeon: Lamar CHRISTELLA Hollingshead, MD;  [facility];  Service: Endoscopy;;  colon   POLYPECTOMY  08/07/2021   Procedure: POLYPECTOMY;  Surgeon: Cindie Dunnings POUR, DO;  Location: [facility];  Service: Endoscopy;;   SINUS ENDO WITH FUSION N/A 03/02/2022   Procedure: SINUS ENDO WITH FUSION;  Surgeon: Karis Clunes, MD;  Location: [facility] OR;  Service: ENT;  Laterality: N/A;   TRANSTHORACIC ECHOCARDIOGRAM  2011   EF 50-55%, stage 1 diastolic dysfunction, trace TR & pulm valve regurg    VASECTOMY  1989    Home Medications:  Allergies as of 07/07/2024       Reactions   Lioresal [baclofen] Other (See Comments)   Lethargy     Zocor [simvastatin] Nausea Only         Medication List        Accurate as of July 06, 2024  7:10 PM. If you have any questions, ask your nurse or doctor.          acetaminophen  325 MG tablet Commonly known as: TYLENOL  Take 2 tablets (650 mg total) by mouth every 6 (six) hours as needed for mild pain (pain score 1-3) (or Fever >/= 101).   albuterol  (2.5 MG/3ML) 0.083% nebulizer solution Commonly known as: PROVENTIL  Take 3 mLs (2.5 mg total) by nebulization every 2 (two) hours as needed for wheezing or shortness of breath (cough).   albuterol  108 (90 Base) MCG/ACT inhaler Commonly known as: VENTOLIN  HFA Inhale 2 puffs into the lungs every 4 (four) hours as needed for wheezing or shortness of breath.   ALPRAZolam  0.25 MG tablet Commonly known as: XANAX  Take 0.25 mg by mouth 2 (two) times daily as needed for sleep or anxiety.   aspirin  EC 81 MG tablet Take 81 mg by mouth daily. Swallow whole.   atorvastatin  40 MG tablet Commonly known as: LIPITOR Take 40 mg by mouth in the morning.   BENEFIBER PO Take 1 Capful by mouth daily.   Breztri  Aerosphere 160-9-4.8 MCG/ACT Aero inhaler Generic drug: budesonide -glycopyrrolate-formoterol  INHALE 2 PUFFS INTO THE LUNGS TWICE A DAY   diltiazem  240 MG 24 hr capsule Commonly known as: CARDIZEM  CD Take 240 mg by mouth daily.   Embecta Pen Needle Ultrafine 31G X 5 MM Misc Generic drug: Insulin  Pen Needle   famotidine  20 MG tablet Commonly known as: PEPCID  TAKE 1 TABLET BY MOUTH EVERY DAY AFTER SUPPER   fluticasone  50 MCG/ACT nasal spray Commonly known as: FLONASE  Place 1 spray into both nostrils daily as needed for allergies or rhinitis.   furosemide  40 MG tablet Commonly known as: LASIX  Take 1 tablet (40 mg total) by mouth daily in the morning and take 1/2 tablet (20 mg total) by mouth daily in the afternoon.   gabapentin  300 MG capsule Commonly known as: NEURONTIN  Take 1 capsule (300 mg total) by mouth at bedtime.   glimepiride  1 MG tablet Commonly known  as: AMARYL  Take 1 mg by mouth daily with breakfast.   insulin  glargine (2 Unit Dial) 300 UNIT/ML Solostar Pen Commonly known as: TOUJEO  MAX Inject into the skin.   metFORMIN  1000 MG tablet Commonly known as: GLUCOPHAGE  Take 1,000 mg by mouth daily with supper.   multivitamin with minerals Tabs tablet Take 1 tablet by mouth at bedtime.   OXYGEN  Inhale 4 L into the lungs continuous.   pantoprazole  40 MG tablet Commonly known as: PROTONIX  TAKE 1 TABLET (40 MG TOTAL) BY MOUTH IN THE MORNING   pioglitazone 15 MG tablet Commonly known as: ACTOS Take 15 mg by mouth daily.   predniSONE  20 MG tablet Commonly known as: DELTASONE  Take 20 mg by mouth 2 (two) times daily.   pyridOXINE 50 MG tablet Commonly known as: B-6 Take 50 mg by mouth daily.   sertraline  50 MG tablet Commonly known as: ZOLOFT  Take 1 tablet (50 mg  total) by mouth daily.   tamsulosin  0.4 MG Caps capsule Commonly known as: FLOMAX  Take 0.4 mg by mouth in the morning.   vitamin C  250 MG tablet Commonly known as: ASCORBIC ACID  Take 250 mg by mouth in the morning.   Vitamin D3 25 MCG (1000 UT) Caps Take 1,000 Units by mouth in the morning.        Allergies: Allergies[1]  Family History: Family History  Problem Relation Age of Onset   Heart disease Mother    Cancer Mother        lymph nodes   Diabetes Mother    Arthritis Other    Lung disease Other    Asthma Other    Kidney disease Other    Ovarian cancer Sister    Diabetes Brother    Heart disease Brother    Hypertension Brother    Diabetes Sister    Diabetes Sister    Cirrhosis Sister 41       no etoh   Colon cancer Neg Hx     Social History:  reports that he quit smoking about 13 years ago. His smoking use included cigarettes. He started smoking about 49 years ago. He has a 45 pack-year smoking history. He has never used smokeless tobacco. He reports that he does not drink alcohol and does not use drugs.  ROS: All other review of  systems were reviewed and are negative except what is noted above in HPI  Physical Exam: There were no vitals taken for this visit.  Constitutional:  Alert and oriented, No acute distress. HEENT: [place] AT, moist mucus membranes.  Trachea midline, no masses. Cardiovascular: No clubbing, cyanosis, or edema. Respiratory: Normal respiratory effort, no increased work of breathing. GI: No inguinal hernias GU: Normal phallus. No masses/lesions on penis, testis, scrotum. Prostate ***g smooth no nodules no induration.  Lymph: No cervical or inguinal lymphadenopathy. Skin: No rashes, bruises or suspicious lesions. Neurologic: Grossly intact, no focal deficits, moving all 4 extremities. Psychiatric: Normal mood and affect.  Laboratory Data: Lab Results  Component Value Date   WBC 13.2 (H) 04/21/2024   HGB 14.3 04/21/2024   HCT 44.4 04/21/2024   MCV 83.3 04/21/2024   PLT 196 04/21/2024    Lab Results  Component Value Date   CREATININE 0.89 04/21/2024    No results found for: PSA  Lab Results  Component Value Date   TESTOSTERONE  263 (L) 11/24/2021    Lab Results  Component Value Date   HGBA1C 7.2 (H) 06/09/2023    Urinalysis   Pertinent Imaging: ***  Assessment:    Plan:    There are no diagnoses linked to this encounter.  No follow-ups on file.  Garnette CHRISTELLA Shack, MD  [facility] [place]      [1]  Allergies Allergen Reactions   Lioresal [Baclofen] Other (See Comments)    Lethargy     Zocor [Simvastatin] Nausea Only   "

## 2024-07-07 ENCOUNTER — Encounter (HOSPITAL_COMMUNITY)
Admission: RE | Admit: 2024-07-07 | Discharge: 2024-07-07 | Disposition: A | Discharge status: Home / Self Care | Source: Ambulatory Visit | Attending: Internal Medicine | Admitting: Internal Medicine

## 2024-07-07 ENCOUNTER — Ambulatory Visit: Admitting: Urology

## 2024-07-07 ENCOUNTER — Encounter (HOSPITAL_COMMUNITY): Payer: Self-pay | Admitting: *Deleted

## 2024-07-07 ENCOUNTER — Encounter: Payer: Self-pay | Admitting: Urology

## 2024-07-07 VITALS — BP 104/55 | HR 111

## 2024-07-07 DIAGNOSIS — N3281 Overactive bladder: Secondary | ICD-10-CM

## 2024-07-07 DIAGNOSIS — J449 Chronic obstructive pulmonary disease, unspecified: Secondary | ICD-10-CM

## 2024-07-07 DIAGNOSIS — N3941 Urge incontinence: Secondary | ICD-10-CM | POA: Diagnosis not present

## 2024-07-07 DIAGNOSIS — R32 Unspecified urinary incontinence: Secondary | ICD-10-CM

## 2024-07-07 LAB — URINALYSIS, ROUTINE W REFLEX MICROSCOPIC
Bilirubin, UA: NEGATIVE
Glucose, UA: NEGATIVE
Ketones, UA: NEGATIVE
Leukocytes,UA: NEGATIVE
Nitrite, UA: NEGATIVE
Protein,UA: NEGATIVE
RBC, UA: NEGATIVE
Specific Gravity, UA: 1.015 (ref 1.005–1.030)
Urobilinogen, Ur: 1 mg/dL (ref 0.2–1.0)
pH, UA: 6 (ref 5.0–7.5)

## 2024-07-07 LAB — BLADDER SCAN AMB NON-IMAGING: Scan Result: 0

## 2024-07-07 MED ORDER — GEMTESA 75 MG PO TABS: 1.0000 | ORAL_TABLET | Freq: Every day | ORAL | Status: AC

## 2024-07-07 NOTE — Progress Notes (Signed)
 Bladder Scan completed today due to reason of urinary incontinence     Patient can void prior to the bladder scan. Bladder scan result: 0  Performed By: Exie T. CMA  Additional notes- Patient is scheduled to follow up with MD

## 2024-07-07 NOTE — Progress Notes (Signed)
 Pulmonary Individual Treatment Plan  Patient Details  Name: [name] MRN: [id number] Date of Birth: [birth year] Referring Provider:   Flowsheet Row PULMONARY REHAB OTHER RESP ORIENTATION from 03/25/2024 in [facility] CARDIAC REH[facility]IL[facility]ATION  Referring Provider Darlean Sharper MD    Initial Encounter Date:  Flowsheet Row PULMONARY REHAB OTHER RESP ORIENTATION from 03/25/2024 in [facility]  Date 03/25/24    Visit Diagnosis: Chronic obstructive pulmonary disease, unspecified COPD type ([name])  Patient's Home Medications on Admission:  Current Medications[1]  Past Medical History: Past Medical History:  Diagnosis Date   Anxiety    Arthritis    Asthma    Chest pain    a. 2003 Cath: nl cors.   Cirrhosis ([name])    secondary to MASH   Concussion    COPD (chronic obstructive pulmonary disease) ([name])    chronically on 3-4L [place]   Depression    Diabetes mellitus    Dyslipidemia    Tachycardia   Dyspnea    Dysrhythmia    Fracture 06/2014   lower back L-1   GERD (gastroesophageal reflux disease)    Hypertension    NASH (nonalcoholic steatohepatitis) 2018   [name], undergoing routine cirrhosis care; [name]   Pneumonia 12/01/2021   PONV (postoperative nausea and vomiting)    Splenomegaly    Thrombocytopenia     Tobacco Use: Tobacco Use History[2]  Labs: Review Flowsheet  More data exists      Latest Ref Rng & Units 05/23/2020 08/18/2020 11/27/2021 06/08/2023 06/09/2023  Labs for ITP Cardiac and Pulmonary Rehab  Hemoglobin A1c 4.8 - 5.6 % 7.9  7.3  6.8  - 7.2   PH, Arterial 7.350 - 7.450 - 7.292  - - -  PCO2 arterial 32.0 - 48.0 mmHg - 42.4  - - -  Bicarbonate 20.0 - 28.0 mmol/L - 19.4  - 31.2  -  Acid-base deficit 0.0 - 2.0 mmol/L - 5.6  - - -  O2 Saturation % - 91.2  - 79.2  -    Capillary Blood Glucose: Lab Results  Component Value Date   GLUCAP 257 (H) 03/31/2024   GLUCAP 240 (H) 08/10/2023    GLUCAP 252 (H) 08/10/2023   GLUCAP 316 (H) 08/10/2023   GLUCAP 356 (H) 08/09/2023     Pulmonary Assessment Scores:  Pulmonary Assessment Scores     Row Name 03/25/24 0934         ADL UCSD   ADL Phase Entry     SOB Score total 56     Rest 1     Walk 2     Stairs 4     Bath 3     Dress 2     Shop 2       CAT Score   CAT Score 25       mMRC Score   mMRC Score 3       UCSD: Self-administered rating of dyspnea associated with activities of daily living (ADLs) 6-point scale (0 = not at all to 5 = maximal or unable to do because of breathlessness)  Scoring Scores range from 0 to 120.  Minimally important difference is 5 units  CAT: CAT can identify the health impairment of COPD patients and is better correlated with disease progression.  CAT has a scoring range of zero to 40. The CAT score is classified into four groups of low (less than 10), medium (10 - 20), high (21-30) and  very high (31-40) based on the impact level of disease on health status. A CAT score over 10 suggests significant symptoms.  A worsening CAT score could be explained by an exacerbation, poor medication adherence, poor inhaler technique, or progression of COPD or comorbid conditions.  CAT MCID is 2 points  mMRC: mMRC (Modified Medical Research Council) Dyspnea Scale is used to assess the degree of baseline functional disability in patients of respiratory disease due to dyspnea. No minimal important difference is established. A decrease in score of 1 point or greater is considered a positive change.   Pulmonary Function Assessment:   Exercise Target Goals: Exercise Program Goal: Individual exercise prescription set using results from initial 6 min walk test and THRR while considering  patients activity barriers and safety.   Exercise Prescription Goal: Initial exercise prescription builds to 30-45 minutes a day of aerobic activity, 2-3 days per week.  Home exercise guidelines will be given to  patient during program as part of exercise prescription that the participant will acknowledge.  Activity Barriers & Risk Stratification:  Activity Barriers & Cardiac Risk Stratification - 03/23/24 1314       Activity Barriers & Cardiac Risk Stratification   Activity Barriers Back Problems;Arthritis;Shortness of Breath;History of Falls   OA right hip.         6 Minute Walk:  6 Minute Walk     Row Name 03/25/24 0941         6 Minute Walk   Phase Initial     Distance 800 feet     Walk Time 6 minutes     # of Rest Breaks 0     MPH 1.51     METS 2.25     RPE 13     Perceived Dyspnea  1     VO2 Peak 7.88     Symptoms No     Resting HR 103 bpm     Resting BP 106/60     Resting Oxygen  Saturation  95 %     Exercise Oxygen  Saturation  during 6 min walk 88 %     Max Ex. HR 125 bpm     Max Ex. BP 126/62     2 Minute Post BP 106/62       Interval HR   1 Minute HR 116     2 Minute HR 119     3 Minute HR 120     4 Minute HR 122     5 Minute HR 123     6 Minute HR 125     2 Minute Post HR 97     Interval Heart Rate? Yes       Interval Oxygen    Interval Oxygen ? Yes     Baseline Oxygen  Saturation % 95 %     1 Minute Oxygen  Saturation % 93 %     1 Minute Liters of Oxygen  3 L     2 Minute Oxygen  Saturation % 90 %     2 Minute Liters of Oxygen  3 L     3 Minute Oxygen  Saturation % 88 %     3 Minute Liters of Oxygen  3 L     4 Minute Oxygen  Saturation % 91 %     4 Minute Liters of Oxygen  3 L     5 Minute Oxygen  Saturation % 91 %     5 Minute Liters of Oxygen  3 L     6 Minute Oxygen  Saturation % 93 %  6 Minute Liters of Oxygen  3 L     2 Minute Post Oxygen  Saturation % 97 %     2 Minute Post Liters of Oxygen  3 L        Oxygen  Initial Assessment:  Oxygen  Initial Assessment - 03/25/24 0944       Initial 6 min Walk   Oxygen  Used Continuous    Liters per minute 3      Program Oxygen  Prescription   Program Oxygen  Prescription Continuous    Liters per minute 3           Oxygen  Re-Evaluation:  Oxygen  Re-Evaluation     Row Name 05/26/24 1053 06/25/24 1049           Program Oxygen  Prescription   Program Oxygen  Prescription Continuous Continuous      Liters per minute 3 3        Home Oxygen    Home Oxygen  Device Portable Concentrator;Home Concentrator E-Tanks;Portable Concentrator;Home Concentrator      Sleep Oxygen  Prescription Continuous Continuous      Liters per minute 3 3      Home Exercise Oxygen  Prescription Continuous None      Liters per minute 3 3      Home Resting Oxygen  Prescription Continuous None      Liters per minute 3 3      Compliance with Home Oxygen  Use Yes Yes        Goals/Expected Outcomes   Short Term Goals To learn and exhibit compliance with exercise, home and travel O2 prescription;To learn and understand importance of monitoring SPO2 with pulse oximeter and demonstrate accurate use of the pulse oximeter.;To learn and understand importance of maintaining oxygen  saturations>88%;To learn and demonstrate proper pursed lip breathing techniques or other breathing techniques. ;To learn and demonstrate proper use of respiratory medications To learn and exhibit compliance with exercise, home and travel O2 prescription;To learn and understand importance of monitoring SPO2 with pulse oximeter and demonstrate accurate use of the pulse oximeter.;To learn and understand importance of maintaining oxygen  saturations>88%;To learn and demonstrate proper pursed lip breathing techniques or other breathing techniques. ;To learn and demonstrate proper use of respiratory medications      Long  Term Goals Exhibits compliance with exercise, home  and travel O2 prescription;Maintenance of O2 saturations>88%;Verbalizes importance of monitoring SPO2 with pulse oximeter and return demonstration;Exhibits proper breathing techniques, such as pursed lip breathing or other method taught during program session;Compliance with respiratory medication;Demonstrates  proper use of MDIs Exhibits compliance with exercise, home  and travel O2 prescription;Verbalizes importance of monitoring SPO2 with pulse oximeter and return demonstration;Maintenance of O2 saturations>88%;Exhibits proper breathing techniques, such as pursed lip breathing or other method taught during program session;Compliance with respiratory medication;Demonstrates proper use of MDIs      Comments Larnell has had a rough last 2 months with having pneumonia. He feels his breathing is much better after getting over that. He is glad to be back in rehab. Joe wears his oxygen  as prescribed.  He takes Breztri  daily and has an albuterol  rescue inhaler as needed.  He has a pulse ox at home and checks his oxygen  levels several times a day.  He continues to work on PLB at home and in rehab.      Goals/Expected Outcomes Short: Continue to attend rehab. Long: Continue checking pulse oximetry at home. Short:  Continue to practice PLB  Long:  Continue to take respiratory medications as prescribed         Oxygen   Discharge (Final Oxygen  Re-Evaluation):  Oxygen  Re-Evaluation - 06/25/24 1049       Program Oxygen  Prescription   Program Oxygen  Prescription Continuous    Liters per minute 3      Home Oxygen    Home Oxygen  Device E-Tanks;Portable Concentrator;Home Concentrator    Sleep Oxygen  Prescription Continuous    Liters per minute 3    Home Exercise Oxygen  Prescription None    Liters per minute 3    Home Resting Oxygen  Prescription None    Liters per minute 3    Compliance with Home Oxygen  Use Yes      Goals/Expected Outcomes   Short Term Goals To learn and exhibit compliance with exercise, home and travel O2 prescription;To learn and understand importance of monitoring SPO2 with pulse oximeter and demonstrate accurate use of the pulse oximeter.;To learn and understand importance of maintaining oxygen  saturations>88%;To learn and demonstrate proper pursed lip breathing techniques or other breathing  techniques. ;To learn and demonstrate proper use of respiratory medications    Long  Term Goals Exhibits compliance with exercise, home  and travel O2 prescription;Verbalizes importance of monitoring SPO2 with pulse oximeter and return demonstration;Maintenance of O2 saturations>88%;Exhibits proper breathing techniques, such as pursed lip breathing or other method taught during program session;Compliance with respiratory medication;Demonstrates proper use of MDIs    Comments Joe wears his oxygen  as prescribed.  He takes Breztri  daily and has an albuterol  rescue inhaler as needed.  He has a pulse ox at home and checks his oxygen  levels several times a day.  He continues to work on PLB at home and in rehab.    Goals/Expected Outcomes Short:  Continue to practice PLB  Long:  Continue to take respiratory medications as prescribed          Initial Exercise Prescription:  Initial Exercise Prescription - 03/25/24 0900       Date of Initial Exercise RX and Referring Provider   Date 03/25/24    Referring Provider Darlean Sharper MD      Oxygen    Oxygen  Continuous    Liters 3    Maintain Oxygen  Saturation 88% or higher      Prescription Details   Frequency (times per week) 2    Duration Progress to 30 minutes of continuous aerobic without signs/symptoms of physical distress      Intensity   THRR 40-80% of Max Heartrate 119-141    Ratings of Perceived Exertion 11-13    Perceived Dyspnea 0-4      Resistance Training   Training Prescription Yes    Weight 3    Reps 10-15          Perform Capillary Blood Glucose checks as needed.  Exercise Prescription Changes:   Exercise Prescription Changes     Row Name 03/25/24 0900 03/31/24 1500 05/26/24 1500 06/11/24 1500       Response to Exercise   Blood Pressure (Admit) 106/60 100/60 102/60 104/60    Blood Pressure (Exercise) 126/62 132/60 146/64 --    Blood Pressure (Exit) 106/62 102/60 92/60 110/64    Heart Rate (Admit) 103 bpm 108 bpm  106 bpm 105 bpm    Heart Rate (Exercise) 125 bpm 115 bpm 118 bpm 125 bpm    Heart Rate (Exit) 97 bpm 104 bpm 109 bpm 102 bpm    Oxygen  Saturation (Admit) 95 % 92 % 91 % 94 %    Oxygen  Saturation (Exercise) 88 % 91 % 90 % 91 %    Oxygen  Saturation Biomedical Engineer)  97 % 95 % 93 % 94 %    Rating of Perceived Exertion (Exercise) 13 11 13 12     Perceived Dyspnea (Exercise) 1 0 2 1    Duration -- Continue with 30 min of aerobic exercise without signs/symptoms of physical distress. Continue with 30 min of aerobic exercise without signs/symptoms of physical distress. Continue with 30 min of aerobic exercise without signs/symptoms of physical distress.    Intensity -- THRR unchanged THRR unchanged THRR unchanged      Progression   Progression -- Continue to progress workloads to maintain intensity without signs/symptoms of physical distress. Continue to progress workloads to maintain intensity without signs/symptoms of physical distress. Continue to progress workloads to maintain intensity without signs/symptoms of physical distress.      Resistance Training   Training Prescription -- Yes Yes Yes    Weight -- 3 3 3     Reps -- 10-15 10-15 10-15      Oxygen    Oxygen  -- Continuous Continuous Continuous    Liters -- 3 3 3       NuStep   Level -- 1 1 1     SPM -- 56 94 160    Minutes -- 15 15 15     METs -- 1.9 2 2.2      Arm Ergometer   Level -- 1 1 1     Watts -- 30 58 70    Minutes -- 15 15 15     METs -- 1.7 2 2.2      Oxygen    Maintain Oxygen  Saturation -- 88% or higher 88% or higher 88% or higher       Exercise Comments:   Exercise Comments     Row Name 03/31/24 1028 05/19/24 1107         Exercise Comments First full day of exercise!  Patient was oriented to gym and equipment including functions, settings, policies, and procedures.  Patient's individual exercise prescription and treatment plan were reviewed.  All starting workloads were established based on the results of the 6 minute walk  test done at initial orientation visit.  The plan for exercise progression was also introduced and progression will be customized based on patient's performance and goals. Called to check on patient.  He is still in a lot of pain in hips and back. He is scheduled to meet with a new doctor for his pain later this week.  He hopes to be able to return next week.         Exercise Goals and Review:   Exercise Goals Re-Evaluation :  Exercise Goals Re-Evaluation     Row Name 04/07/24 0827 05/26/24 1051 05/28/24 0933 06/12/24 0836 06/25/24 1054     Exercise Goal Re-Evaluation   Exercise Goals Review Increase Physical Activity;Increase Strength and Stamina;Understanding of Exercise Prescription Increase Physical Activity;Increase Strength and Stamina;Able to understand and use Dyspnea scale;Able to understand and use rate of perceived exertion (RPE) scale;Knowledge and understanding of Target Heart Rate Range (THRR);Understanding of Exercise Prescription Increase Physical Activity;Increase Strength and Stamina;Understanding of Exercise Prescription Increase Physical Activity;Increase Strength and Stamina;Understanding of Exercise Prescription Increase Physical Activity;Increase Strength and Stamina;Understanding of Exercise Prescription   Comments Joe has just started PR. HE did well on his first day getting use to all the equipment and the hand weights. Will continue to montior and progress as able. Joe is just coming back from being out sick for 2 months. He didn't exercise any while he was out. He said somedays it was hard  just to get out of the bed. He is eager and glad to be back and feeling better. Joe just come back from being sick for 2 months. He is getitng use to the equipment again and is starting at level 1. Will contiue to monitor and progress as able Larnell has been doing well sincehe came back to rehab. He is still at level 1 on both sets of equipment but his SPM and RPM have increased. Will ask  about increasing levels next week. Will continue tro monitor and progress as able Larnell is on his 11th session today.  He continues to do well in rehab.  He is trying level 2 today on the Nustep, and he wants to continue to improve on both the Nustep and AE.  He stated that his endurance/strength have increased since starting the program, and his SOB has decreased slightly.  He has bands and weights that he uses at home a few times a week.   Expected Outcomes Short: increase SPM and RPM    long: continue to attend rehab Short: Continue to attend rehab. Long: Try to incorporate some exercise at home. Short: Continue to attend rehab. Long: Try to incorporate some exercise at home. Short: increase levels as able   long: continue to attend rehab Short:  Increase to level 2 on the Nustep and AE  Long:  Continue to exercise at home      Discharge Exercise Prescription (Final Exercise Prescription Changes):  Exercise Prescription Changes - 06/11/24 1500       Response to Exercise   Blood Pressure (Admit) 104/60    Blood Pressure (Exit) 110/64    Heart Rate (Admit) 105 bpm    Heart Rate (Exercise) 125 bpm    Heart Rate (Exit) 102 bpm    Oxygen  Saturation (Admit) 94 %    Oxygen  Saturation (Exercise) 91 %    Oxygen  Saturation (Exit) 94 %    Rating of Perceived Exertion (Exercise) 12    Perceived Dyspnea (Exercise) 1    Duration Continue with 30 min of aerobic exercise without signs/symptoms of physical distress.    Intensity THRR unchanged      Progression   Progression Continue to progress workloads to maintain intensity without signs/symptoms of physical distress.      Resistance Training   Training Prescription Yes    Weight 3    Reps 10-15      Oxygen    Oxygen  Continuous    Liters 3      NuStep   Level 1    SPM 160    Minutes 15    METs 2.2      Arm Ergometer   Level 1    Watts 70    Minutes 15    METs 2.2      Oxygen    Maintain Oxygen  Saturation 88% or higher           Nutrition:  Target Goals: Understanding of nutrition guidelines, daily intake of sodium 1500mg , cholesterol 200mg , calories 30% from fat and 7% or less from saturated fats, daily to have 5 or more servings of fruits and vegetables.  Biometrics:  Pre Biometrics - 03/25/24 0943       Pre Biometrics   Height 5' 8 (1.727 m)    Weight 170 lb 3.1 oz (77.2 kg)    Waist Circumference 46 inches    Hip Circumference 39 inches    Waist to Hip Ratio 1.18 %    BMI (Calculated) 25.88  Grip Strength 27.2 kg    Single Leg Stand 0 seconds           Nutrition Therapy Plan and Nutrition Goals:   Nutrition Assessments:  MEDIFICTS Score Key: >=70 Need to make dietary changes  40-70 Heart Healthy Diet <= 40 Therapeutic Level Cholesterol Diet  Flowsheet Row PULMONARY REHAB OTHER [facility]P [facility]  Picture Your Plate Total Score on Admission 65   Picture Your Plate Scores: <59 Unhealthy dietary pattern with much room for improvement. 41-50 Dietary pattern unlikely to meet recommendations for good health and room for improvement. 51-60 More healthful dietary pattern, with some room for improvement.  >60 Healthy dietary pattern, although there may be some specific behaviors that could be improved.    Nutrition Goals Re-Evaluation:  Nutrition Goals Re-Evaluation     Row Name 05/26/24 1048 06/25/24 1058           Goals   Nutrition Goal Healthy diet. Healthy eating      Comment Larnell is just now getting his appetite back from being sick. He states he didn't eat much at all when he was sick and he lost about 7-8 pounds. He is drinking and eating ok now. Joe tries to eat healthy, and he stays away from salty foods and red meat.  He eats mainly baked chicken, and he enjoys salads.  He eats plenty of fruits and veggies, and he tries to stay away from too many sweets since he is a diabetic.      Expected Outcome Short: Continue to attend  rehab. Long: Continue to eat healthy and maintain weight. Short:  Continue to attend rehab to maintain weight                     Long:  Continue to stay away from too many sweets         Nutrition Goals Discharge (Final Nutrition Goals Re-Evaluation):  Nutrition Goals Re-Evaluation - 06/25/24 1058       Goals   Nutrition Goal Healthy eating    Comment Joe tries to eat healthy, and he stays away from salty foods and red meat.  He eats mainly baked chicken, and he enjoys salads.  He eats plenty of fruits and veggies, and he tries to stay away from too many sweets since he is a diabetic.    Expected Outcome Short:  Continue to attend rehab to maintain weight                     Long:  Continue to stay away from too many sweets          Psychosocial: Target Goals: Acknowledge presence or absence of significant depression and/or stress, maximize coping skills, provide positive support system. Participant is able to verbalize types and ability to use techniques and skills needed for reducing stress and depression.  Initial Review & Psychosocial Screening:  Initial Psych Review & Screening - 03/23/24 1331       Initial Review   Current issues with History of Depression;Current Psychotropic Meds;Current Sleep Concerns      Family Dynamics   Good Support System? Yes    Comments Patient wife and daughter support him.      Barriers   Psychosocial barriers to participate in program The patient should benefit from training in stress management and relaxation.      Screening Interventions   Interventions To provide support and resources with identified psychosocial  needs;Provide feedback about the scores to participant;Encouraged to exercise    Expected Outcomes Short Term goal: Utilizing psychosocial counselor, staff and physician to assist with identification of specific Stressors or current issues interfering with healing process. Setting desired goal for each stressor or current issue  identified.;Long Term Goal: Stressors or current issues are controlled or eliminated.;Short Term goal: Identification and review with participant of any Quality of Life or Depression concerns found by scoring the questionnaire.;Long Term goal: The participant improves quality of Life and PHQ9 Scores as seen by post scores and/or verbalization of changes          Quality of Life Scores:  Scores of 19 and below usually indicate a poorer quality of life in these areas.  A difference of  2-3 points is a clinically meaningful difference.  A difference of 2-3 points in the total score of the Quality of Life Index has been associated with significant improvement in overall quality of life, self-image, physical symptoms, and general health in studies assessing change in quality of life.   PHQ-9: Review Flowsheet  More data may exist      04/28/2024 03/25/2024 08/16/2020 01/15/2018 12/18/2017  Depression screen PHQ 2/9  Decreased Interest 0 2 0 0 0  Down, Depressed, Hopeless 0 1 0 0 0  PHQ - 2 Score 0 3 0 0 0  Altered sleeping - 1 - - -  Tired, decreased energy - 1 - - -  Change in appetite - 1 - - -  Feeling bad or failure about yourself  - 1 - - -  Trouble concentrating - 1 - - -  Moving slowly or fidgety/restless - 1 - - -  Suicidal thoughts - 0 - - -  PHQ-9 Score - 9  - - -  Difficult doing work/chores - Somewhat difficult - - -    Details       Data saved with a previous flowsheet row definition        Interpretation of Total Score  Total Score Depression Severity:  1-4 = Minimal depression, 5-9 = Mild depression, 10-14 = Moderate depression, 15-19 = Moderately severe depression, 20-27 = Severe depression   Psychosocial Evaluation and Intervention:  Psychosocial Evaluation - 03/23/24 1332       Psychosocial Evaluation & Interventions   Interventions Stress management education;Relaxation education;Encouraged to exercise with the program and follow exercise prescription     Comments Patient was referred to pulmonary rehab with COPD. He says he does have some depression/anxiety managed with Zoloft  and Alprazolam . He lives with his wife who is his main support person along with his daughter when needed. He says he has trouble staying asleep some nights. His goals for the program are to improve his mobility and breathing. 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 He has no barriers identified to complete the program.    Expected Outcomes Short Term: Patient will start the program and attend consistently. Long Term: Patient will complete the program meeting personal goals.    Continue Psychosocial Services  Follow up required by staff          Psychosocial Re-Evaluation:  Psychosocial Re-Evaluation     Row Name 05/26/24 1047 06/25/24 1101           Psychosocial Re-Evaluation   Current issues with None Identified Current Anxiety/Panic      Comments Joe identifies no sleep  or stress concerns at the moment. He has been out the last 2 months with pneumonia, so he is glad to be back and feeling better. Joe denies any issues with stress or depression.  He states that he sleeps ok at night.  He takes Xanax  as needed for anxiety.  He stated that he gets frustrated that he cannot do the things he used to, and that leads to anxiety.  However, his wife and daughter are a good support system for him.      Expected Outcomes Short: Continue to attend rehab. Long: Continue to have healthy stress outlets. Short:  Continue to attend rehab  Long:  Continue to take Xanax  as needed      Interventions Encouraged to attend Pulmonary Rehabilitation for the exercise Encouraged to attend Pulmonary Rehabilitation for the exercise      Continue Psychosocial Services  Follow up required by staff Follow up required by staff         Psychosocial Discharge (Final Psychosocial Re-Evaluation):  Psychosocial Re-Evaluation -  06/25/24 1101       Psychosocial Re-Evaluation   Current issues with Current Anxiety/Panic    Comments Joe denies any issues with stress or depression.  He states that he sleeps ok at night.  He takes Xanax  as needed for anxiety.  He stated that he gets frustrated that he cannot do the things he used to, and that leads to anxiety.  However, his wife and daughter are a good support system for him.    Expected Outcomes Short:  Continue to attend rehab  Long:  Continue to take Xanax  as needed    Interventions Encouraged to attend Pulmonary Rehabilitation for the exercise    Continue Psychosocial Services  Follow up required by staff           Education: Education Goals: Education classes will be provided on a weekly basis, covering required topics. Participant will state understanding/return demonstration of topics presented.  Learning Barriers/Preferences:  Learning Barriers/Preferences - 03/23/24 1330       Learning Barriers/Preferences   Learning Barriers None    Learning Preferences Skilled Demonstration;Pictoral          Education Topics: How Lungs Work and Diseases: - Discuss the anatomy of the lungs and diseases that can affect the lungs, such as COPD.   Exercise: -Discuss the importance of exercise, FITT principles of exercise, normal and abnormal responses to exercise, and how to exercise safely.   Environmental Irritants: -Discuss types of environmental irritants and how to limit exposure to environmental irritants.   Meds/Inhalers and oxygen : - Discuss respiratory medications, definition of an inhaler and oxygen , and the proper way to use an inhaler and oxygen .   Energy Saving Techniques: - Discuss methods to conserve energy and decrease shortness of breath when performing activities of daily living.    Bronchial Hygiene / Breathing Techniques: - Discuss breathing mechanics, pursed-lip breathing technique,  proper posture, effective ways to clear airways,  and other functional breathing techniques   Cleaning Equipment: - Provides group verbal and written instruction about the health risks of elevated stress, cause of high stress, and healthy ways to reduce stress.   Nutrition I: Fats: - Discuss the types of cholesterol, what cholesterol does to the body, and how cholesterol levels can be controlled.   Nutrition II: Labels: -Discuss the different components of food labels and how to read food labels.   Respiratory Infections: - Discuss the signs and symptoms of respiratory infections, ways to prevent respiratory  infections, and the importance of seeking medical treatment when having a respiratory infection.   Stress I: Signs and Symptoms: - Discuss the causes of stress, how stress may lead to anxiety and depression, and ways to limit stress.   Stress II: Relaxation: -Discuss relaxation techniques to limit stress.   Oxygen  for Home/Travel: - Discuss how to prepare for travel when on oxygen  and proper ways to transport and store oxygen  to ensure safety.   Knowledge Questionnaire Score:  Knowledge Questionnaire Score - 03/25/24 0951       Knowledge Questionnaire Score   Pre Score 15/18          Core Components/Risk Factors/Patient Goals at Admission:  Personal Goals and Risk Factors at Admission - 03/23/24 1330       Core Components/Risk Factors/Patient Goals on Admission    Weight Management Weight Maintenance    Improve shortness of breath with ADL's Yes    Intervention Provide education, individualized exercise plan and daily activity instruction to help decrease symptoms of SOB with activities of daily living.    Expected Outcomes Short Term: Improve cardiorespiratory fitness to achieve a reduction of symptoms when performing ADLs;Long Term: Be able to perform more ADLs without symptoms or delay the onset of symptoms    Increase knowledge of respiratory medications and ability to use respiratory devices properly  Yes     Intervention Provide education and demonstration as needed of appropriate use of medications, inhalers, and oxygen  therapy.    Expected Outcomes Short Term: Achieves understanding of medications use. Understands that oxygen  is a medication prescribed by physician. Demonstrates appropriate use of inhaler and oxygen  therapy.;Long Term: Maintain appropriate use of medications, inhalers, and oxygen  therapy.    Diabetes Yes    Intervention Provide education about signs/symptoms and action to take for hypo/hyperglycemia.;Provide education about proper nutrition, including hydration, and aerobic/resistive exercise prescription along with prescribed medications to achieve blood glucose in normal ranges: Fasting glucose 65-99 mg/dL    Expected Outcomes Short Term: Participant verbalizes understanding of the signs/symptoms and immediate care of hyper/hypoglycemia, proper foot care and importance of medication, aerobic/resistive exercise and nutrition plan for blood glucose control.;Long Term: Attainment of HbA1C < 7%.    Hypertension Yes    Intervention Provide education on lifestyle modifcations including regular physical activity/exercise, weight management, moderate sodium restriction and increased consumption of fresh fruit, vegetables, and low fat dairy, alcohol moderation, and smoking cessation.;Monitor prescription use compliance.    Expected Outcomes Short Term: Continued assessment and intervention until BP is < 140/70mm HG in hypertensive participants. < 130/31mm HG in hypertensive participants with diabetes, heart failure or chronic kidney disease.;Long Term: Maintenance of blood pressure at goal levels.    Lipids Yes    Intervention Provide education and support for participant on nutrition & aerobic/resistive exercise along with prescribed medications to achieve LDL 70mg , HDL >40mg .    Expected Outcomes Short Term: Participant states understanding of desired cholesterol values and is compliant with  medications prescribed. Participant is following exercise prescription and nutrition guidelines.;Long Term: Cholesterol controlled with medications as prescribed, with individualized exercise RX and with personalized nutrition plan. Value goals: LDL < 70mg , HDL > 40 mg.          Core Components/Risk Factors/Patient Goals Review:   Goals and Risk Factor Review     Row Name 05/26/24 1049 06/25/24 1105           Core Components/Risk Factors/Patient Goals Review   Personal Goals Review Hypertension;Develop more efficient breathing techniques such  as purse lipped breathing and diaphragmatic breathing and practicing self-pacing with activity.;Increase knowledge of respiratory medications and ability to use respiratory devices properly.;Improve shortness of breath with ADL's Diabetes;Lipids;Hypertension;Increase knowledge of respiratory medications and ability to use respiratory devices properly.;Improve shortness of breath with ADL's;Develop more efficient breathing techniques such as purse lipped breathing and diaphragmatic breathing and practicing self-pacing with activity.      Review It is Joe's first day back to rehab since he has been out sick for the last 2 months. He is glad to be back. Larnell is on his 11th session today and continues to do well in rehab.  He takes his respiratory, BP, cholesterol, and diabetes medication as prescribed.  He checks his BP twice a day, and also checks his BS daily.  He weighs at home every few days, and he always documents his readings from home to show his physician.  He wants to improve his health, and he moved up to level 2 on the Nustep today.      Expected Outcomes Short: Continue to attend rehab. Long: Continue to check vitals at home and report any abnormalities. Short:  Continue to attend rehab  Long:  Continue taking medication as prescribed and recording vitals at home         Core Components/Risk Factors/Patient Goals at Discharge (Final Review):    Goals and Risk Factor Review - 06/25/24 1105       Core Components/Risk Factors/Patient Goals Review   Personal Goals Review Diabetes;Lipids;Hypertension;Increase knowledge of respiratory medications and ability to use respiratory devices properly.;Improve shortness of breath with ADL's;Develop more efficient breathing techniques such as purse lipped breathing and diaphragmatic breathing and practicing self-pacing with activity.    Review Larnell is on his 11th session today and continues to do well in rehab.  He takes his respiratory, BP, cholesterol, and diabetes medication as prescribed.  He checks his BP twice a day, and also checks his BS daily.  He weighs at home every few days, and he always documents his readings from home to show his physician.  He wants to improve his health, and he moved up to level 2 on the Nustep today.    Expected Outcomes Short:  Continue to attend rehab  Long:  Continue taking medication as prescribed and recording vitals at home          ITP Comments:  ITP Comments     Row Name 03/23/24 1338 03/25/24 0955 03/31/24 1028 04/15/24 1532 05/13/24 0900   ITP Comments Virtual orientation visit completed for pulmonary rehab with COPD. On-site orientation visit scheduled for 03/25/24 at 8:30. Patient arrived for 1st visit/orientation/education at 0830. Patient was referred to PR by Dr. Darlean due to COPD. During orientation advised patient on arrival and appointment times what to wear, what to do before, during and after exercise. Reviewed attendance and class policy.  Pt is scheduled to return Pulmonary Rehab on 03/31/24 at 10:30. Pt was advised to come to class 15 minutes before class starts.  Discussed RPE/Dpysnea scales. Patient participated in warm up stretches. Patient was able to complete 6 minute walk test. Patient was measured for the equipment. Discussed equipment safety with patient. Took patient pre-anthropometric measurements. Patient finished visit at 950. First full  day of exercise!  Patient was oriented to gym and equipment including functions, settings, policies, and procedures.  Patient's individual exercise prescription and treatment plan were reviewed.  All starting workloads were established based on the results of the 6 minute walk test done  at initial orientation visit.  The plan for exercise progression was also introduced and progression will be customized based on patient's performance and goals. 30 day review completed. ITP sent to Dr.Jehanzeb Memon, Medical Director of  Pulmonary Rehab. Continue with ITP unless changes are made by physician.    Newer to program 30 day review completed. ITP sent to Dr.Jehanzeb Memon, Medical Director of  Pulmonary Rehab. Continue with ITP unless changes are made by physician.    Newer to program, been out sick with pneumonia.    Row Name 05/19/24 1106 06/09/24 1058 07/07/24 1445       ITP Comments Called to check on patient.  He is still in a lot of pain in hips and back. He is scheduled to meet with a new doctor for his pain later this week.  He hopes to be able to return next week. 30 day review completed. ITP sent to Dr.Jehanzeb Memon, Medical Director of  Pulmonary Rehab. Continue with ITP unless changes are made by physician. 30 day review completed. ITP sent to Dr.Jehanzeb Memon, Medical Director of  Pulmonary Rehab. Continue with ITP unless changes are made by physician.        Comments: 30 day review      [1]  Current Outpatient Medications:    acetaminophen  (TYLENOL ) 325 MG tablet, Take 2 tablets (650 mg total) by mouth every 6 (six) hours as needed for mild pain (pain score 1-3) (or Fever >/= 101)., Disp: , Rfl:    albuterol  (PROVENTIL ) (2.5 MG/3ML) 0.083% nebulizer solution, Take 3 mLs (2.5 mg total) by nebulization every 2 (two) hours as needed for wheezing or shortness of breath (cough)., Disp: 75 mL, Rfl: 2   albuterol  (VENTOLIN  HFA) 108 (90 Base) MCG/ACT inhaler, Inhale 2 puffs into the lungs every 4  (four) hours as needed for wheezing or shortness of breath., Disp: 8 g, Rfl: 2   ALPRAZolam  (XANAX ) 0.25 MG tablet, Take 0.25 mg by mouth 2 (two) times daily as needed for sleep or anxiety., Disp: , Rfl:    aspirin  EC 81 MG tablet, Take 81 mg by mouth daily. Swallow whole., Disp: , Rfl:    atorvastatin  (LIPITOR) 40 MG tablet, Take 40 mg by mouth in the morning., Disp: , Rfl:    BREZTRI  AEROSPHERE 160-9-4.8 MCG/ACT AERO inhaler, INHALE 2 PUFFS INTO THE LUNGS TWICE A DAY, Disp: 10.7 each, Rfl: 11   Cholecalciferol  (VITAMIN D3) 25 MCG (1000 UT) CAPS, Take 1,000 Units by mouth in the morning., Disp: , Rfl:    diltiazem  (CARDIZEM  CD) 240 MG 24 hr capsule, Take 240 mg by mouth daily., Disp: , Rfl:    EMBECTA PEN NEEDLE ULTRAFINE 31G X 5 MM MISC, , Disp: , Rfl:    famotidine  (PEPCID ) 20 MG tablet, TAKE 1 TABLET BY MOUTH EVERY DAY AFTER SUPPER, Disp: 90 tablet, Rfl: 0   fluticasone  (FLONASE ) 50 MCG/ACT nasal spray, Place 1 spray into both nostrils daily as needed for allergies or rhinitis., Disp: , Rfl:    furosemide  (LASIX ) 40 MG tablet, Take 1 tablet (40 mg total) by mouth daily in the morning and take 1/2 tablet (20 mg total) by mouth daily in the afternoon., Disp: 135 tablet, Rfl: 1   gabapentin  (NEURONTIN ) 300 MG capsule, Take 1 capsule (300 mg total) by mouth at bedtime., Disp: 30 capsule, Rfl: 5   glimepiride  (AMARYL ) 1 MG tablet, Take 1 mg by mouth daily with breakfast., Disp: , Rfl:    insulin  glargine, 2 Unit Dial, (  TOUJEO  MAX) 300 UNIT/ML Solostar Pen, Inject into the skin., Disp: , Rfl:    metFORMIN  (GLUCOPHAGE ) 1000 MG tablet, Take 1,000 mg by mouth daily with supper., Disp: , Rfl:    Multiple Vitamin (MULTIVITAMIN WITH MINERALS) TABS tablet, Take 1 tablet by mouth at bedtime., Disp: 30 tablet, Rfl: 2   OXYGEN , Inhale 4 L into the lungs continuous., Disp: , Rfl:    pantoprazole  (PROTONIX ) 40 MG tablet, TAKE 1 TABLET (40 MG TOTAL) BY MOUTH IN THE MORNING, Disp: 90 tablet, Rfl: 1   pioglitazone  (ACTOS) 15 MG tablet, Take 15 mg by mouth daily., Disp: , Rfl:    predniSONE  (DELTASONE ) 20 MG tablet, Take 20 mg by mouth 2 (two) times daily., Disp: , Rfl:    pyridOXINE (B-6) 50 MG tablet, Take 50 mg by mouth daily., Disp: , Rfl:    sertraline  (ZOLOFT ) 50 MG tablet, Take 1 tablet (50 mg total) by mouth daily., Disp: 30 tablet, Rfl: 3   tamsulosin  (FLOMAX ) 0.4 MG CAPS capsule, Take 0.4 mg by mouth in the morning., Disp: , Rfl:    Vibegron (GEMTESA) 75 MG TABS, Take 1 tablet (75 mg total) by mouth daily., Disp: , Rfl:    vitamin C  (ASCORBIC ACID ) 250 MG tablet, Take 250 mg by mouth in the morning., Disp: , Rfl:    Wheat Dextrin (BENEFIBER PO), Take 1 Capful by mouth daily., Disp: , Rfl:  [2]  Social History Tobacco Use  Smoking Status Former   Current packs/day: 0.00   Average packs/day: 1.3 packs/day for 36.0 years (45.0 ttl pk-yrs)   Types: Cigarettes   Start date: 04/1975   Quit date: 04/2011   Years since quitting: 13.2  Smokeless Tobacco Never

## 2024-07-07 NOTE — Progress Notes (Signed)
 Daily Session Note  Patient Details  Name: [name] MRN: [id number] Date of Birth: January 09, 1956 Referring Provider:   Flowsheet Row PULMONARY REHAB OTHER RESP ORIENTATION from 03/25/2024 in [facility] CARDIAC REH[facility]ITATION  Referring Provider Darlean Sharper MD    Encounter Date: 07/07/2024  Check In:  Session Check In - 07/07/24 1031       Check-In   Supervising physician immediately available to respond to emergencies See telemetry face sheet for immediately available MD    Location [facility]          Capillary Blood Glucose: No results found for this or any previous visit (from the past 24 hours).    Tobacco Use History[1]  Goals Met:  Independence with exercise equipment Exercise tolerated well Strength training completed today  Goals Unmet:  Not Applicable  Comments: Pt able to follow exercise prescription today without complaint.  Will continue to monitor for progression.        [1]  Social History Tobacco Use  Smoking Status Former   Current packs/day: 0.00   Average packs/day: 1.3 packs/day for 36.0 years (45.0 ttl pk-yrs)   Types: Cigarettes   Start date: 04/1975   Quit date: 04/2011   Years since quitting: 13.2  Smokeless Tobacco Never

## 2024-07-09 ENCOUNTER — Encounter (HOSPITAL_COMMUNITY)

## 2024-07-14 ENCOUNTER — Encounter (HOSPITAL_COMMUNITY)

## 2024-07-14 ENCOUNTER — Telehealth (HOSPITAL_COMMUNITY): Payer: Self-pay

## 2024-07-14 NOTE — Telephone Encounter (Signed)
 Called patient to check on him missing class, him and his wife both have the flu. Will take out appt's for the next two weeks per patient request in order to recover.

## 2024-07-16 ENCOUNTER — Encounter (HOSPITAL_COMMUNITY)

## 2024-07-21 ENCOUNTER — Encounter (HOSPITAL_COMMUNITY)

## 2024-07-23 ENCOUNTER — Encounter (HOSPITAL_COMMUNITY): Attending: Internal Medicine

## 2024-07-23 DIAGNOSIS — J449 Chronic obstructive pulmonary disease, unspecified: Secondary | ICD-10-CM | POA: Insufficient documentation

## 2024-07-28 ENCOUNTER — Encounter (HOSPITAL_COMMUNITY)

## 2024-07-30 ENCOUNTER — Encounter (HOSPITAL_COMMUNITY)

## 2024-08-02 NOTE — Progress Notes (Unsigned)
 "  Assessment:  Overactive bladder with urgency incontinence  Plan:   1.  Overactive bladder guide sheet given  2.  I put him on a 1 month sample trial of Gemtesa   3.  I will see him back in 1 month for recheck.  If he is doing well, we will consider taking him off of tamsulosin .   HPI: 12.30.2025:  69 year old male with COPD comes in today with an extended history of urinary frequency, urgency and urgency incontinence.  He typically has a good stream and usually feels like he empties well.  He has been told that he had a large prostate and has been on tamsulosin  for some time.  He does wear incontinence briefs, maybe changes them once or twice.    PVR volume 0. He was started on Rx for OAB  1.27.2026:    PMH: Past Medical History:  Diagnosis Date   Anxiety    Arthritis    Asthma    Chest pain    a. 2003 Cath: nl cors.   Cirrhosis ([name])    secondary to MASH   Concussion    COPD (chronic obstructive pulmonary disease) ([name])    chronically on 3-4L [place]   Depression    Diabetes mellitus    Dyslipidemia    Tachycardia   Dyspnea    Dysrhythmia    Fracture 06/2014   lower back L-1   GERD (gastroesophageal reflux disease)    Hypertension    NASH (nonalcoholic steatohepatitis) 2018   [name], undergoing routine cirrhosis care; [name]   Pneumonia 12/01/2021   PONV (postoperative nausea and vomiting)    Splenomegaly    Thrombocytopenia     Surgical History: Past Surgical History:  Procedure Laterality Date   BIOPSY  07/26/2016   Procedure: BIOPSY;  Surgeon: Lamar CHRISTELLA Hollingshead, MD;  Location: [facility];  Service: Endoscopy;;  gastric   CARDIAC CATHETERIZATION  10/22/2001   normal coronary arteriea (Dr. MICAEL Ona)   COLONOSCOPY  08/31/2011   MFM:Floupeoz rectal and colon polyps-treated. Single tubular adenoma. Next TCS 08/2016   COLONOSCOPY N/A 07/26/2016   Dr. Hollingshead: diverticulosis in sigmoid and descending colon. 6 mm  benign splenic flexure. surveillance 5 yeras   COLONOSCOPY WITH PROPOFOL  N/A 08/07/2021   Surgeon: Cindie Dunnings K, DO;  Nonbleeding internal hemorrhoids, three 3-6 mm polyps in the transverse colon resected and retrieved, otherwise normal exam.  Pathology with 1 tubular adenoma, otherwise benign colonic mucosa.  Recommended 5-year repeat.   ESOPHAGOGASTRODUODENOSCOPY N/A 07/26/2016   Dr. Hollingshead: normal esophagus, portal gastropathy, chronic gastritis, normal duodenum, screening EGD 2 years    ESOPHAGOGASTRODUODENOSCOPY (EGD) WITH ESOPHAGEAL DILATION N/A 05/15/2013   MFM:[id number] esophagus-status post Agapito dilation/Portal gastropathy. Antral erosions-status post biopsy. extrinsic compression along lesser curvature likely secondary to splenomegaly. gastric bx benign   ESOPHAGOGASTRODUODENOSCOPY (EGD) WITH PROPOFOL  N/A 12/12/2020   Surgeon: Hollingshead Lamar CHRISTELLA, MD; ormal esophagus s/p dilation, portal hypertensive gastropathy.  Due for repeat in 2024.   ESOPHAGOGASTRODUODENOSCOPY (EGD) WITH PROPOFOL  N/A 05/22/2023   Procedure: ESOPHAGOGASTRODUODENOSCOPY (EGD) WITH PROPOFOL ;  Surgeon: Hollingshead Lamar CHRISTELLA, MD;  Location: [facility];  Service: Endoscopy;  Laterality: N/A;  9:30 am, asa 3   ETHMOIDECTOMY Bilateral 03/02/2022   Procedure: ETHMOIDECTOMY;  Surgeon: Karis Clunes, MD;  [facility];  Service: ENT;  Laterality: Bilateral;   HERNIA REPAIR  03/2009   MALONEY DILATION N/A 12/12/2020   Procedure: AGAPITO DILATION;  Surgeon: Hollingshead Lamar CHRISTELLA, MD;  Location: [facility];  Service: Endoscopy;  Laterality: N/A;   MAXILLARY ANTROSTOMY Bilateral 03/02/2022   Procedure: MAXILLARY ANTROSTOMY;  Surgeon: Karis Clunes, MD;  Location: [facility];  Service: ENT;  Laterality: Bilateral;   NASAL SEPTOPLASTY W/ TURBINOPLASTY Bilateral 03/02/2022   Procedure: NASAL SEPTOPLASTY WITH TURBINATE REDUCTION;  Surgeon: Karis Clunes, MD;  Location: [facility] OR;  Service: ENT;  Laterality: Bilateral;   POLYPECTOMY  07/26/2016   Procedure:  POLYPECTOMY;  Surgeon: Lamar CHRISTELLA Hollingshead, MD;  Location: [facility];  Service: Endoscopy;;  colon   POLYPECTOMY  08/07/2021   Procedure: POLYPECTOMY;  Surgeon: Cindie Carlin POUR, DO;  Location: [facility];  Service: Endoscopy;;   SINUS ENDO WITH FUSION N/A 03/02/2022   Procedure: SINUS ENDO WITH FUSION;  Surgeon: Karis Clunes, MD;  Location: [facility];  Service: ENT;  Laterality: N/A;   TRANSTHORACIC ECHOCARDIOGRAM  2011   EF 50-55%, stage 1 diastolic dysfunction, trace TR & pulm valve regurg    VASECTOMY  1989    Home Medications:  Allergies as of 08/04/2024       Reactions   Lioresal [baclofen] Other (See Comments)   Lethargy     Zocor [simvastatin] Nausea Only        Medication List        Accurate as of August 02, 2024 10:15 AM. If you have any questions, ask your nurse or doctor.          acetaminophen  325 MG tablet Commonly known as: TYLENOL  Take 2 tablets (650 mg total) by mouth every 6 (six) hours as needed for mild pain (pain score 1-3) (or Fever >/= 101).   albuterol  (2.5 MG/3ML) 0.083% nebulizer solution Commonly known as: PROVENTIL  Take 3 mLs (2.5 mg total) by nebulization every 2 (two) hours as needed for wheezing or shortness of breath (cough).   albuterol  108 (90 Base) MCG/ACT inhaler Commonly known as: VENTOLIN  HFA Inhale 2 puffs into the lungs every 4 (four) hours as needed for wheezing or shortness of breath.   ALPRAZolam  0.25 MG tablet Commonly known as: XANAX  Take 0.25 mg by mouth 2 (two) times daily as needed for sleep or anxiety.   aspirin  EC 81 MG tablet Take 81 mg by mouth daily. Swallow whole.   atorvastatin  40 MG tablet Commonly known as: LIPITOR Take 40 mg by mouth in the morning.   BENEFIBER PO Take 1 Capful by mouth daily.   Breztri  Aerosphere 160-9-4.8 MCG/ACT Aero inhaler Generic drug: budesonide -glycopyrrolate-formoterol  INHALE 2 PUFFS INTO THE LUNGS TWICE A DAY   diltiazem  240 MG 24 hr capsule Commonly known as: CARDIZEM   CD Take 240 mg by mouth daily.   Embecta Pen Needle Ultrafine 31G X 5 MM Misc Generic drug: Insulin  Pen Needle   famotidine  20 MG tablet Commonly known as: PEPCID  TAKE 1 TABLET BY MOUTH EVERY DAY AFTER SUPPER   fluticasone  50 MCG/ACT nasal spray Commonly known as: FLONASE  Place 1 spray into both nostrils daily as needed for allergies or rhinitis.   furosemide  40 MG tablet Commonly known as: LASIX  Take 1 tablet (40 mg total) by mouth daily in the morning and take 1/2 tablet (20 mg total) by mouth daily in the afternoon.   gabapentin  300 MG capsule Commonly known as: NEURONTIN  Take 1 capsule (300 mg total) by mouth at bedtime.   Gemtesa  75 MG Tabs Generic drug: Vibegron  Take 1 tablet (75 mg total) by mouth daily.   glimepiride  1 MG tablet Commonly known as: AMARYL  Take 1 mg by  mouth daily with breakfast.   insulin  glargine (2 Unit Dial) 300 UNIT/ML Solostar Pen Commonly known as: TOUJEO  MAX Inject into the skin.   metFORMIN  1000 MG tablet Commonly known as: GLUCOPHAGE  Take 1,000 mg by mouth daily with supper.   multivitamin with minerals Tabs tablet Take 1 tablet by mouth at bedtime.   OXYGEN  Inhale 4 L into the lungs continuous.   pantoprazole  40 MG tablet Commonly known as: PROTONIX  TAKE 1 TABLET (40 MG TOTAL) BY MOUTH IN THE MORNING   pioglitazone 15 MG tablet Commonly known as: ACTOS Take 15 mg by mouth daily.   predniSONE  20 MG tablet Commonly known as: DELTASONE  Take 20 mg by mouth 2 (two) times daily.   pyridOXINE 50 MG tablet Commonly known as: B-6 Take 50 mg by mouth daily.   sertraline  50 MG tablet Commonly known as: ZOLOFT  Take 1 tablet (50 mg total) by mouth daily.   tamsulosin  0.4 MG Caps capsule Commonly known as: FLOMAX  Take 0.4 mg by mouth in the morning.   vitamin C  250 MG tablet Commonly known as: ASCORBIC ACID  Take 250 mg by mouth in the morning.   Vitamin D3 25 MCG (1000 UT) Caps Take 1,000 Units by mouth in the morning.         Allergies: Allergies[1]  Family History: Family History  Problem Relation Age of Onset   Heart disease Mother    Cancer Mother        lymph nodes   Diabetes Mother    Arthritis Other    Lung disease Other    Asthma Other    Kidney disease Other    Ovarian cancer Sister    Diabetes Brother    Heart disease Brother    Hypertension Brother    Diabetes Sister    Diabetes Sister    Cirrhosis Sister 29       no etoh   Colon cancer Neg Hx     Social History:  reports that he quit smoking about 13 years ago. His smoking use included cigarettes. He started smoking about 49 years ago. He has a 45 pack-year smoking history. He has never used smokeless tobacco. He reports that he does not drink alcohol and does not use drugs.  ROS: All other review of systems were reviewed and are negative except what is noted above in HPI  Physical Exam: There were no vitals taken for this visit.  Constitutional:  Alert and oriented, No acute distress. HEENT: [place] AT, moist mucus membranes.  Trachea midline, no masses. Cardiovascular: No clubbing, cyanosis, or edema. Respiratory: Normal respiratory effort, no increased work of breathing. GI: No inguinal hernias GU: Phallus is circumcised.  Very mild meatal stenosis.  No penile lesions.  Scrotal skin normal.  Testicles atrophic.  Cords and epididymal structures normal bilaterally.  Normal anal sphincter tone.  Prostate is small, symmetric, nonnodular, nontender. Lymph: No cervical or inguinal lymphadenopathy. Skin: No rashes, bruises or suspicious lesions. Neurologic: Grossly intact, no focal deficits, moving all 4 extremities. Psychiatric: Normal mood and affect.  Laboratory Data: Lab Results  Component Value Date   WBC 13.2 (H) 04/21/2024   HGB 14.3 04/21/2024   HCT 44.4 04/21/2024   MCV 83.3 04/21/2024   PLT 196 04/21/2024    Lab Results  Component Value Date   CREATININE 0.89 04/21/2024    No results found for: PSA  Lab  Results  Component Value Date   TESTOSTERONE  263 (L) 11/24/2021    Lab Results  Component Value  Date   HGBA1C 7.2 (H) 06/09/2023    Urinalysis Clear  Residual urine volume 0         [1]  Allergies Allergen Reactions   Lioresal [Baclofen] Other (See Comments)    Lethargy     Zocor [Simvastatin] Nausea Only   "

## 2024-08-04 ENCOUNTER — Ambulatory Visit: Admitting: Urology

## 2024-08-04 ENCOUNTER — Encounter (HOSPITAL_COMMUNITY): Payer: Self-pay

## 2024-08-04 DIAGNOSIS — R32 Unspecified urinary incontinence: Secondary | ICD-10-CM

## 2024-08-04 DIAGNOSIS — N3281 Overactive bladder: Secondary | ICD-10-CM

## 2024-08-04 DIAGNOSIS — J449 Chronic obstructive pulmonary disease, unspecified: Secondary | ICD-10-CM

## 2024-08-04 NOTE — Progress Notes (Signed)
 Pulmonary Individual Treatment Plan  Patient Details  Name: [name] MRN: [id number] Date of Birth: [birth year] Referring Provider:   Flowsheet Row PULMONARY REHAB OTHER RESP ORIENTATION from 03/25/2024 in [facility] CARDIAC REH[facility]IL[facility]ATION  Referring Provider Darlean Sharper MD    Initial Encounter Date:  Flowsheet Row PULMONARY REHAB OTHER RESP ORIENTATION from 03/25/2024 in [facility] [place] [facility]  Date 03/25/24    Visit Diagnosis: Chronic obstructive pulmonary disease, unspecified COPD type ([name])  Patient's Home Medications on Admission: Current Medications[1]  Past Medical History: Past Medical History:  Diagnosis Date   Anxiety    Arthritis    Asthma    Chest pain    a. 2003 Cath: nl cors.   Cirrhosis ([name])    secondary to MASH   Concussion    COPD (chronic obstructive pulmonary disease) ([name])    chronically on 3-4L    Depression    Diabetes mellitus    Dyslipidemia    Tachycardia   Dyspnea    Dysrhythmia    Fracture 06/2014   lower back L-1   GERD (gastroesophageal reflux disease)    Hypertension    NASH (nonalcoholic steatohepatitis) 2018   [name], undergoing routine cirrhosis care; [name]   Pneumonia 12/01/2021   PONV (postoperative nausea and vomiting)    Splenomegaly    Thrombocytopenia     Tobacco Use: Tobacco Use History[2]  Labs: Review Flowsheet  More data exists      Latest Ref Rng & Units 05/23/2020 08/18/2020 11/27/2021 06/08/2023 06/09/2023  Labs for ITP Cardiac and Pulmonary Rehab  Hemoglobin A1c 4.8 - 5.6 % 7.9  7.3  6.8  - 7.2   PH, Arterial 7.350 - 7.450 - 7.292  - - -  PCO2 arterial 32.0 - 48.0 mmHg - 42.4  - - -  Bicarbonate 20.0 - 28.0 mmol/L - 19.4  - 31.2  -  Acid-base deficit 0.0 - 2.0 mmol/L - 5.6  - - -  O2 Saturation % - 91.2  - 79.2  -     Pulmonary Assessment Scores:  Pulmonary Assessment Scores     Row Name 03/25/24 0934         ADL UCSD   ADL Phase  Entry     SOB Score total 56     Rest 1     Walk 2     Stairs 4     Bath 3     Dress 2     Shop 2       CAT Score   CAT Score 25       mMRC Score   mMRC Score 3        UCSD: Self-administered rating of dyspnea associated with activities of daily living (ADLs) 6-point scale (0 = not at all to 5 = maximal or unable to do because of breathlessness)  Scoring Scores range from 0 to 120.  Minimally important difference is 5 units  CAT: CAT can identify the health impairment of COPD patients and is better correlated with disease progression.  CAT has a scoring range of zero to 40. The CAT score is classified into four groups of low (less than 10), medium (10 - 20), high (21-30) and very high (31-40) based on the impact level of disease on health status. A CAT score over 10 suggests significant symptoms.  A worsening CAT score could be explained by an exacerbation, poor medication adherence, poor inhaler technique, or progression of COPD  or comorbid conditions.  CAT MCID is 2 points  mMRC: mMRC (Modified Medical Research Council) Dyspnea Scale is used to assess the degree of baseline functional disability in patients of respiratory disease due to dyspnea. No minimal important difference is established. A decrease in score of 1 point or greater is considered a positive change.   Pulmonary Function Assessment:   Exercise Target Goals: Exercise Program Goal: Individual exercise prescription set using results from initial 6 min walk test and THRR while considering  patients activity barriers and safety.   Exercise Prescription Goal: Initial exercise prescription builds to 30-45 minutes a day of aerobic activity, 2-3 days per week.  Home exercise guidelines will be given to patient during program as part of exercise prescription that the participant will acknowledge.  Education: Aerobic Exercise: - Group verbal and visual presentation on the components of exercise prescription.  Introduces F.I.T.T principle from ACSM for exercise prescriptions.  Reviews F.I.T.T. principles of aerobic exercise including progression. Written material provided at class time.   Education: Resistance Exercise: - Group verbal and visual presentation on the components of exercise prescription. Introduces F.I.T.T principle from ACSM for exercise prescriptions  Reviews F.I.T.T. principles of resistance exercise including progression. Written material provided at class time. Flowsheet Row PULMONARY REHAB OTHER RESPIRATORY from 06/25/2024 in [facility]  Date 06/18/24  Educator HB  Instruction Review Code 1- Verbalizes Understanding     Education: Exercise & Equipment Safety: - Individual verbal instruction and demonstration of equipment use and safety with use of the equipment.   Education: Exercise Physiology & General Exercise Guidelines: - Group verbal and written instruction with models to review the exercise physiology of the cardiovascular system and associated critical values. Provides general exercise guidelines with specific guidelines to those with heart or lung disease.    Education: Flexibility, Balance, Mind/Body Relaxation: - Group verbal and visual presentation with interactive activity on the components of exercise prescription. Introduces F.I.T.T principle from ACSM for exercise prescriptions. Reviews F.I.T.T. principles of flexibility and balance exercise training including progression. Also discusses the mind body connection.  Reviews various relaxation techniques to help reduce and manage stress (i.e. Deep breathing, progressive muscle relaxation, and visualization). Balance handout provided to take home. Written material provided at class time.   Activity Barriers & Risk Stratification:  Activity Barriers & Cardiac Risk Stratification - 03/23/24 1314       Activity Barriers & Cardiac Risk Stratification   Activity Barriers Back  Problems;Arthritis;Shortness of Breath;History of Falls   OA right hip.         6 Minute Walk:  6 Minute Walk     Row Name 03/25/24 0941         6 Minute Walk   Phase Initial     Distance 800 feet     Walk Time 6 minutes     # of Rest Breaks 0     MPH 1.51     METS 2.25     RPE 13     Perceived Dyspnea  1     VO2 Peak 7.88     Symptoms No     Resting HR 103 bpm     Resting BP 106/60     Resting Oxygen  Saturation  95 %     Exercise Oxygen  Saturation  during 6 min walk 88 %     Max Ex. HR 125 bpm     Max Ex. BP 126/62     2 Minute Post BP 106/62  Interval HR   1 Minute HR 116     2 Minute HR 119     3 Minute HR 120     4 Minute HR 122     5 Minute HR 123     6 Minute HR 125     2 Minute Post HR 97     Interval Heart Rate? Yes       Interval Oxygen    Interval Oxygen ? Yes     Baseline Oxygen  Saturation % 95 %     1 Minute Oxygen  Saturation % 93 %     1 Minute Liters of Oxygen  3 L     2 Minute Oxygen  Saturation % 90 %     2 Minute Liters of Oxygen  3 L     3 Minute Oxygen  Saturation % 88 %     3 Minute Liters of Oxygen  3 L     4 Minute Oxygen  Saturation % 91 %     4 Minute Liters of Oxygen  3 L     5 Minute Oxygen  Saturation % 91 %     5 Minute Liters of Oxygen  3 L     6 Minute Oxygen  Saturation % 93 %     6 Minute Liters of Oxygen  3 L     2 Minute Post Oxygen  Saturation % 97 %     2 Minute Post Liters of Oxygen  3 L       Oxygen  Initial Assessment:  Oxygen  Initial Assessment - 03/25/24 0944       Initial 6 min Walk   Oxygen  Used Continuous    Liters per minute 3      Program Oxygen  Prescription   Program Oxygen  Prescription Continuous    Liters per minute 3          Oxygen  Re-Evaluation:  Oxygen  Re-Evaluation     Row Name 05/26/24 1053 06/25/24 1049           Program Oxygen  Prescription   Program Oxygen  Prescription Continuous Continuous      Liters per minute 3 3        Home Oxygen    Home Oxygen  Device Portable Concentrator;Home  Concentrator E-Tanks;Portable Concentrator;Home Concentrator      Sleep Oxygen  Prescription Continuous Continuous      Liters per minute 3 3      Home Exercise Oxygen  Prescription Continuous None      Liters per minute 3 3      Home Resting Oxygen  Prescription Continuous None      Liters per minute 3 3      Compliance with Home Oxygen  Use Yes Yes        Goals/Expected Outcomes   Short Term Goals To learn and exhibit compliance with exercise, home and travel O2 prescription;To learn and understand importance of monitoring SPO2 with pulse oximeter and demonstrate accurate use of the pulse oximeter.;To learn and understand importance of maintaining oxygen  saturations>88%;To learn and demonstrate proper pursed lip breathing techniques or other breathing techniques. ;To learn and demonstrate proper use of respiratory medications To learn and exhibit compliance with exercise, home and travel O2 prescription;To learn and understand importance of monitoring SPO2 with pulse oximeter and demonstrate accurate use of the pulse oximeter.;To learn and understand importance of maintaining oxygen  saturations>88%;To learn and demonstrate proper pursed lip breathing techniques or other breathing techniques. ;To learn and demonstrate proper use of respiratory medications      Long  Term Goals Exhibits compliance with exercise, home  and  travel O2 prescription;Maintenance of O2 saturations>88%;Verbalizes importance of monitoring SPO2 with pulse oximeter and return demonstration;Exhibits proper breathing techniques, such as pursed lip breathing or other method taught during program session;Compliance with respiratory medication;Demonstrates proper use of MDIs Exhibits compliance with exercise, home  and travel O2 prescription;Verbalizes importance of monitoring SPO2 with pulse oximeter and return demonstration;Maintenance of O2 saturations>88%;Exhibits proper breathing techniques, such as pursed lip breathing or other method  taught during program session;Compliance with respiratory medication;Demonstrates proper use of MDIs      Comments [name] has had a rough last 2 months with having pneumonia. He feels his breathing is much better after getting over that. He is glad to be back in rehab. [name] wears his oxygen  as prescribed.  He takes Breztri  daily and has an albuterol  rescue inhaler as needed.  He has a pulse ox at home and checks his oxygen  levels several times a day.  He continues to work on PLB at home and in rehab.      Goals/Expected Outcomes Short: Continue to attend rehab. Long: Continue checking pulse oximetry at home. Short:  Continue to practice PLB  Long:  Continue to take respiratory medications as prescribed         Oxygen  Discharge (Final Oxygen  Re-Evaluation):  Oxygen  Re-Evaluation - 06/25/24 1049       Program Oxygen  Prescription   Program Oxygen  Prescription Continuous    Liters per minute 3      Home Oxygen    Home Oxygen  Device E-Tanks;Portable Concentrator;Home Concentrator    Sleep Oxygen  Prescription Continuous    Liters per minute 3    Home Exercise Oxygen  Prescription None    Liters per minute 3    Home Resting Oxygen  Prescription None    Liters per minute 3    Compliance with Home Oxygen  Use Yes      Goals/Expected Outcomes   Short Term Goals To learn and exhibit compliance with exercise, home and travel O2 prescription;To learn and understand importance of monitoring SPO2 with pulse oximeter and demonstrate accurate use of the pulse oximeter.;To learn and understand importance of maintaining oxygen  saturations>88%;To learn and demonstrate proper pursed lip breathing techniques or other breathing techniques. ;To learn and demonstrate proper use of respiratory medications    Long  Term Goals Exhibits compliance with exercise, home  and travel O2 prescription;Verbalizes importance of monitoring SPO2 with pulse oximeter and return demonstration;Maintenance of O2 saturations>88%;Exhibits  proper breathing techniques, such as pursed lip breathing or other method taught during program session;Compliance with respiratory medication;Demonstrates proper use of MDIs    Comments [name] wears his oxygen  as prescribed.  He takes Breztri  daily and has an albuterol  rescue inhaler as needed.  He has a pulse ox at home and checks his oxygen  levels several times a day.  He continues to work on PLB at home and in rehab.    Goals/Expected Outcomes Short:  Continue to practice PLB  Long:  Continue to take respiratory medications as prescribed          Initial Exercise Prescription:  Initial Exercise Prescription - 03/25/24 0900       Date of Initial Exercise RX and Referring Provider   Date 03/25/24    Referring Provider Darlean Sharper MD      Oxygen    Oxygen  Continuous    Liters 3    Maintain Oxygen  Saturation 88% or higher      Prescription Details   Frequency (times per week) 2    Duration Progress to 30 minutes  of continuous aerobic without signs/symptoms of physical distress      Intensity   THRR 40-80% of Max Heartrate 119-141    Ratings of Perceived Exertion 11-13    Perceived Dyspnea 0-4      Resistance Training   Training Prescription Yes    Weight 3    Reps 10-15          Perform Capillary Blood Glucose checks as needed.  Exercise Prescription Changes:   Exercise Prescription Changes     Row Name 03/25/24 0900 03/31/24 1500 05/26/24 1500 06/11/24 1500 07/07/24 1500     Response to Exercise   Blood Pressure (Admit) 106/60 100/60 102/60 104/60 108/62   Blood Pressure (Exercise) 126/62 132/60 146/64 -- --   Blood Pressure (Exit) 106/62 102/60 92/60 110/64 96/80   Heart Rate (Admit) 103 bpm 108 bpm 106 bpm 105 bpm 122 bpm   Heart Rate (Exercise) 125 bpm 115 bpm 118 bpm 125 bpm 122 bpm   Heart Rate (Exit) 97 bpm 104 bpm 109 bpm 102 bpm 113 bpm   Oxygen  Saturation (Admit) 95 % 92 % 91 % 94 % 90 %   Oxygen  Saturation (Exercise) 88 % 91 % 90 % 91 % 94 %   Oxygen   Saturation (Exit) 97 % 95 % 93 % 94 % 95 %   Rating of Perceived Exertion (Exercise) 13 11 13 12 12    Perceived Dyspnea (Exercise) 1 0 2 1 2    Duration -- Continue with 30 min of aerobic exercise without signs/symptoms of physical distress. Continue with 30 min of aerobic exercise without signs/symptoms of physical distress. Continue with 30 min of aerobic exercise without signs/symptoms of physical distress. Continue with 30 min of aerobic exercise without signs/symptoms of physical distress.   Intensity -- THRR unchanged THRR unchanged THRR unchanged THRR unchanged     Progression   Progression -- Continue to progress workloads to maintain intensity without signs/symptoms of physical distress. Continue to progress workloads to maintain intensity without signs/symptoms of physical distress. Continue to progress workloads to maintain intensity without signs/symptoms of physical distress. Continue to progress workloads to maintain intensity without signs/symptoms of physical distress.     Resistance Training   Training Prescription -- Yes Yes Yes --   Weight -- 3 3 3 3    Reps -- 10-15 10-15 10-15 10-15     Oxygen    Oxygen  -- Continuous Continuous Continuous Continuous   Liters -- 3 3 3 3      NuStep   Level -- 1 1 1 2    SPM -- 56 94 160 106   Minutes -- 15 15 15 15    METs -- 1.9 2 2.2 2.2     Arm Ergometer   Level -- 1 1 1 1    Watts -- 30 58 70 86   Minutes -- 15 15 15 15    METs -- 1.7 2 2.2 2.4     Oxygen    Maintain Oxygen  Saturation -- 88% or higher 88% or higher 88% or higher 88% or higher      Exercise Comments:   Exercise Comments     Row Name 03/31/24 1028 05/19/24 1107         Exercise Comments First full day of exercise!  Patient was oriented to gym and equipment including functions, settings, policies, and procedures.  Patient's individual exercise prescription and treatment plan were reviewed.  All starting workloads were established based on the results of the 6  minute walk test done at initial  orientation visit.  The plan for exercise progression was also introduced and progression will be customized based on patient's performance and goals. Called to check on patient.  He is still in a lot of pain in hips and back. He is scheduled to meet with a new doctor for his pain later this week.  He hopes to be able to return next week.         Exercise Goals and Review:   Exercise Goals Re-Evaluation :  Exercise Goals Re-Evaluation     Row Name 04/07/24 0827 05/26/24 1051 05/28/24 0933 06/12/24 0836 06/25/24 1054     Exercise Goal Re-Evaluation   Exercise Goals Review Increase Physical Activity;Increase Strength and Stamina;Understanding of Exercise Prescription Increase Physical Activity;Increase Strength and Stamina;Able to understand and use Dyspnea scale;Able to understand and use rate of perceived exertion (RPE) scale;Knowledge and understanding of Target Heart Rate Range (THRR);Understanding of Exercise Prescription Increase Physical Activity;Increase Strength and Stamina;Understanding of Exercise Prescription Increase Physical Activity;Increase Strength and Stamina;Understanding of Exercise Prescription Increase Physical Activity;Increase Strength and Stamina;Understanding of Exercise Prescription   Comments [name] has just started PR. HE did well on his first day getting use to all the equipment and the hand weights. Will continue to montior and progress as able. [name] is just coming back from being out sick for 2 months. He didn't exercise any while he was out. He said somedays it was hard just to get out of the bed. He is eager and glad to be back and feeling better. [name] just come back from being sick for 2 months. He is getitng use to the equipment again and is starting at level 1. Will contiue to monitor and progress as able [name] has been doing well sincehe came back to rehab. He is still at level 1 on both sets of equipment but his SPM and RPM have increased.  Will ask about increasing levels next week. Will continue tro monitor and progress as able [name] is on his 11th session today.  He continues to do well in rehab.  He is trying level 2 today on the Nustep, and he wants to continue to improve on both the Nustep and AE.  He stated that his endurance/strength have increased since starting the program, and his SOB has decreased slightly.  He has bands and weights that he uses at home a few times a week.   Expected Outcomes Short: increase SPM and RPM    long: continue to attend rehab Short: Continue to attend rehab. Long: Try to incorporate some exercise at home. Short: Continue to attend rehab. Long: Try to incorporate some exercise at home. Short: increase levels as able   long: continue to attend rehab Short:  Increase to level 2 on the Nustep and AE  Long:  Continue to exercise at home    Row Name 07/08/24 0806 07/08/24 0807           Exercise Goal Re-Evaluation   Exercise Goals Review Increase Physical Activity;Increase Strength and Stamina;Understanding of Exercise Prescription Increase Physical Activity;Increase Strength and Stamina;Understanding of Exercise Prescription      Comments -- [name] has finished his 14th session of PR. He is doing well with rehab and have beeing increaing his spm and RPM on both sets of equipment. will continue to monitor and progress as able      Expected Outcomes -- Short: increase to level two on the AE  l ong: contiue to exericse at home  Discharge Exercise Prescription (Final Exercise Prescription Changes):  Exercise Prescription Changes - 07/07/24 1500       Response to Exercise   Blood Pressure (Admit) 108/62    Blood Pressure (Exit) 96/80    Heart Rate (Admit) 122 bpm    Heart Rate (Exercise) 122 bpm    Heart Rate (Exit) 113 bpm    Oxygen  Saturation (Admit) 90 %    Oxygen  Saturation (Exercise) 94 %    Oxygen  Saturation (Exit) 95 %    Rating of Perceived Exertion (Exercise) 12    Perceived Dyspnea  (Exercise) 2    Duration Continue with 30 min of aerobic exercise without signs/symptoms of physical distress.    Intensity THRR unchanged      Progression   Progression Continue to progress workloads to maintain intensity without signs/symptoms of physical distress.      Resistance Training   Weight 3    Reps 10-15      Oxygen    Oxygen  Continuous    Liters 3      NuStep   Level 2    SPM 106    Minutes 15    METs 2.2      Arm Ergometer   Level 1    Watts 86    Minutes 15    METs 2.4      Oxygen    Maintain Oxygen  Saturation 88% or higher          Nutrition:  Target Goals: Understanding of nutrition guidelines, daily intake of sodium 1500mg , cholesterol 200mg , calories 30% from fat and 7% or less from saturated fats, daily to have 5 or more servings of fruits and vegetables.  Education: Nutrition 1 -Group instruction provided by verbal, written material, interactive activities, discussions, models, and posters to present general guidelines for heart healthy nutrition including macronutrients, label reading, and promoting whole foods over processed counterparts. Education serves as pensions consultant of discussion of heart healthy eating for all. Written material provided at class time.     Education: Nutrition 2 -Group instruction provided by verbal, written material, interactive activities, discussions, models, and posters to present general guidelines for heart healthy nutrition including sodium, cholesterol, and saturated fat. Providing guidance of habit forming to improve blood pressure, cholesterol, and body weight. Written material provided at class time.     Biometrics:  Pre Biometrics - 03/25/24 0943       Pre Biometrics   Height 5' 8 (1.727 m)    Weight 170 lb 3.1 oz (77.2 kg)    Waist Circumference 46 inches    Hip Circumference 39 inches    Waist to Hip Ratio 1.18 %    BMI (Calculated) 25.88    Grip Strength 27.2 kg    Single Leg Stand 0 seconds            Nutrition Therapy Plan and Nutrition Goals:   Nutrition Assessments:  MEDIFICTS Score Key: >=70 Need to make dietary changes  40-70 Heart Healthy Diet <= 40 Therapeutic Level Cholesterol Diet  Flowsheet Row PULMONARY REHAB OTHER RESP ORIENTATION from 03/25/2024 in [facility] CARDIAC REHABILITATION  Picture Your Plate Total Score on Admission 65   Picture Your Plate Scores: <59 Unhealthy dietary pattern with much room for improvement. 41-50 Dietary pattern unlikely to meet recommendations for good health and room for improvement. 51-60 More healthful dietary pattern, with some room for improvement.  >60 Healthy dietary pattern, although there may be some specific behaviors that could be improved.   Nutrition Goals Re-Evaluation:  Nutrition Goals Re-Evaluation     Row Name 05/26/24 1048 06/25/24 1058           Goals   Nutrition Goal Healthy diet. Healthy eating      Comment [name] is just now getting his appetite back from being sick. He states he didn't eat much at all when he was sick and he lost about 7-8 pounds. He is drinking and eating ok now. [name] tries to eat healthy, and he stays away from salty foods and red meat.  He eats mainly baked chicken, and he enjoys salads.  He eats plenty of fruits and veggies, and he tries to stay away from too many sweets since he is a diabetic.      Expected Outcome Short: Continue to attend rehab. Long: Continue to eat healthy and maintain weight. Short:  Continue to attend rehab to maintain weight                     Long:  Continue to stay away from too many sweets         Nutrition Goals Discharge (Final Nutrition Goals Re-Evaluation):  Nutrition Goals Re-Evaluation - 06/25/24 1058       Goals   Nutrition Goal Healthy eating    Comment [name] tries to eat healthy, and he stays away from salty foods and red meat.  He eats mainly baked chicken, and he enjoys salads.  He eats plenty of fruits and veggies, and he tries to stay away from  too many sweets since he is a diabetic.    Expected Outcome Short:  Continue to attend rehab to maintain weight                     Long:  Continue to stay away from too many sweets          Psychosocial: Target Goals: Acknowledge presence or absence of significant depression and/or stress, maximize coping skills, provide positive support system. Participant is able to verbalize types and ability to use techniques and skills needed for reducing stress and depression.   Education: Stress, Anxiety, and Depression - Group verbal and visual presentation to define topics covered.  Reviews how body is impacted by stress, anxiety, and depression.  Also discusses healthy ways to reduce stress and to treat/manage anxiety and depression.  Written material provided at class time. Flowsheet Row PULMONARY REHAB OTHER RESPIRATORY from 06/25/2024 in [place] [facility]  Date 05/28/24  Educator [facility]  Instruction Review Code 1- Verbalizes Understanding    Education: Sleep Hygiene -Provides group verbal and written instruction about how sleep can affect your health.  Define sleep hygiene, discuss sleep cycles and impact of sleep habits. Review good sleep hygiene tips.    Initial Review & Psychosocial Screening:  Initial Psych Review & Screening - 03/23/24 1331       Initial Review   Current issues with History of Depression;Current Psychotropic Meds;Current Sleep Concerns      Family Dynamics   Good Support System? Yes    Comments Patient wife and daughter support him.      Barriers   Psychosocial barriers to participate in program The patient should benefit from training in stress management and relaxation.      Screening Interventions   Interventions To provide support and resources with identified psychosocial needs;Provide feedback about the scores to participant;Encouraged to exercise    Expected Outcomes Short Term goal: Utilizing psychosocial counselor, staff and physician to  assist  with identification of specific Stressors or current issues interfering with healing process. Setting desired goal for each stressor or current issue identified.;Long Term Goal: Stressors or current issues are controlled or eliminated.;Short Term goal: Identification and review with participant of any Quality of Life or Depression concerns found by scoring the questionnaire.;Long Term goal: The participant improves quality of Life and PHQ9 Scores as seen by post scores and/or verbalization of changes          Quality of Life Scores:  Scores of 19 and below usually indicate a poorer quality of life in these areas.  A difference of  2-3 points is a clinically meaningful difference.  A difference of 2-3 points in the total score of the Quality of Life Index has been associated with significant improvement in overall quality of life, self-image, physical symptoms, and general health in studies assessing change in quality of life.  PHQ-9: Review Flowsheet  More data may exist      04/28/2024 03/25/2024 08/16/2020 01/15/2018 12/18/2017  Depression screen PHQ 2/9  Decreased Interest 0 2 0 0 0  Down, Depressed, Hopeless 0 1 0 0 0  PHQ - 2 Score 0 3 0 0 0  Altered sleeping - 1 - - -  Tired, decreased energy - 1 - - -  Change in appetite - 1 - - -  Feeling bad or failure about yourself  - 1 - - -  Trouble concentrating - 1 - - -  Moving slowly or fidgety/restless - 1 - - -  Suicidal thoughts - 0 - - -  PHQ-9 Score - 9  - - -  Difficult doing work/chores - Somewhat difficult - - -    Details       Data saved with a previous flowsheet row definition        Interpretation of Total Score  Total Score Depression Severity:  1-4 = Minimal depression, 5-9 = Mild depression, 10-14 = Moderate depression, 15-19 = Moderately severe depression, 20-27 = Severe depression   Psychosocial Evaluation and Intervention:  Psychosocial Evaluation - 03/23/24 1332       Psychosocial Evaluation &  Interventions   Interventions Stress management education;Relaxation education;Encouraged to exercise with the program and follow exercise prescription    Comments Patient was referred to pulmonary rehab with COPD. He says he does have some depression/anxiety managed with Zoloft  and Alprazolam . He lives with his wife who is his main support person along with his daughter when needed. He says he has trouble staying asleep some nights. His goals for the program are to improve his mobility and breathing. 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 He has no barriers identified to complete the program.    Expected Outcomes Short Term: Patient will start the program and attend consistently. Long Term: Patient will complete the program meeting personal goals.    Continue Psychosocial Services  Follow up required by staff          Psychosocial Re-Evaluation:  Psychosocial Re-Evaluation     Row Name 05/26/24 1047 06/25/24 1101           Psychosocial Re-Evaluation   Current issues with None Identified Current Anxiety/Panic      Comments [name] identifies no sleep or stress concerns at the moment. He has been out the last 2 months with pneumonia, so he is glad to be back and feeling better. [name]  denies any issues with stress or depression.  He states that he sleeps ok at night.  He takes Xanax  as needed for anxiety.  He stated that he gets frustrated that he cannot do the things he used to, and that leads to anxiety.  However, his wife and daughter are a good support system for him.      Expected Outcomes Short: Continue to attend rehab. Long: Continue to have healthy stress outlets. Short:  Continue to attend rehab  Long:  Continue to take Xanax  as needed      Interventions Encouraged to attend Pulmonary Rehabilitation for the exercise Encouraged to attend Pulmonary Rehabilitation for the exercise      Continue Psychosocial Services   Follow up required by staff Follow up required by staff         Psychosocial Discharge (Final Psychosocial Re-Evaluation):  Psychosocial Re-Evaluation - 06/25/24 1101       Psychosocial Re-Evaluation   Current issues with Current Anxiety/Panic    Comments [name] denies any issues with stress or depression.  He states that he sleeps ok at night.  He takes Xanax  as needed for anxiety.  He stated that he gets frustrated that he cannot do the things he used to, and that leads to anxiety.  However, his wife and daughter are a good support system for him.    Expected Outcomes Short:  Continue to attend rehab  Long:  Continue to take Xanax  as needed    Interventions Encouraged to attend Pulmonary Rehabilitation for the exercise    Continue Psychosocial Services  Follow up required by staff          Education: Education Goals: Education classes will be provided on a weekly basis, covering required topics. Participant will state understanding/return demonstration of topics presented.  Learning Barriers/Preferences:  Learning Barriers/Preferences - 03/23/24 1330       Learning Barriers/Preferences   Learning Barriers None    Learning Preferences Skilled Demonstration;Pictoral          General Pulmonary Education Topics:  Infection Prevention: - Provides verbal and written material to individual with discussion of infection control including proper hand washing and proper equipment cleaning during exercise session.   Falls Prevention: - Provides verbal and written material to individual with discussion of falls prevention and safety.   Chronic Lung Disease Review: - Group verbal instruction with posters, models, PowerPoint presentations and videos,  to review new updates, new respiratory medications, new advancements in procedures and treatments. Providing information on websites and 800 numbers for continued self-education. Includes information about supplement oxygen , available  portable oxygen  systems, continuous and intermittent flow rates, oxygen  safety, concentrators, and Medicare reimbursement for oxygen . Explanation of Pulmonary Drugs, including class, frequency, complications, importance of spacers, rinsing mouth after steroid MDI's, and proper cleaning methods for nebulizers. Review of basic lung anatomy and physiology related to function, structure, and complications of lung disease. Review of risk factors. Discussion about methods for diagnosing sleep apnea and types of masks and machines for OSA. Includes a review of the use of types of environmental controls: home humidity, furnaces, filters, dust mite/pet prevention, HEPA vacuums. Discussion about weather changes, air quality and the benefits of nasal washing. Instruction on Warning signs, infection symptoms, calling MD promptly, preventive modes, and value of vaccinations. Review of effective airway clearance, coughing and/or vibration techniques. Emphasizing that all should Create an Action Plan. Written material provided at class time.   AED/CPR: - Group verbal and written instruction with the use of models  to demonstrate the basic use of the AED with the basic ABC's of resuscitation.    Tests and Procedures:  - Group verbal and visual presentation and models provide information about basic cardiac anatomy and function. Reviews the testing methods done to diagnose heart disease and the outcomes of the test results. Describes the treatment choices: Medical Management, Angioplasty, or Coronary Bypass Surgery for treating various heart conditions including Myocardial Infarction, Angina, Valve Disease, and Cardiac Arrhythmias.  Written material provided at class time. Flowsheet Row PULMONARY REHAB OTHER RESPIRATORY from 06/25/2024 in [place] [facility]  Date 06/25/24  Educator [facility]  Instruction Review Code 1- Verbalizes Understanding    Medication Safety: - Group verbal and visual instruction to  review commonly prescribed medications for heart and lung disease. Reviews the medication, class of the drug, and side effects. Includes the steps to properly store meds and maintain the prescription regimen.  Written material given at graduation. Flowsheet Row PULMONARY REHAB OTHER RESPIRATORY from 06/25/2024 in [place] [facility]  Date 04/09/24  Educator [facility]  Instruction Review Code 1- Verbalizes Understanding    Other: -Provides group and verbal instruction on various topics (see comments)   Knowledge Questionnaire Score:  Knowledge Questionnaire Score - 03/25/24 0951       Knowledge Questionnaire Score   Pre Score 15/18           Core Components/Risk Factors/Patient Goals at Admission:  Personal Goals and Risk Factors at Admission - 03/23/24 1330       Core Components/Risk Factors/Patient Goals on Admission    Weight Management Weight Maintenance    Improve shortness of breath with ADL's Yes    Intervention Provide education, individualized exercise plan and daily activity instruction to help decrease symptoms of SOB with activities of daily living.    Expected Outcomes Short Term: Improve cardiorespiratory fitness to achieve a reduction of symptoms when performing ADLs;Long Term: Be able to perform more ADLs without symptoms or delay the onset of symptoms    Increase knowledge of respiratory medications and ability to use respiratory devices properly  Yes    Intervention Provide education and demonstration as needed of appropriate use of medications, inhalers, and oxygen  therapy.    Expected Outcomes Short Term: Achieves understanding of medications use. Understands that oxygen  is a medication prescribed by physician. Demonstrates appropriate use of inhaler and oxygen  therapy.;Long Term: Maintain appropriate use of medications, inhalers, and oxygen  therapy.    Diabetes Yes    Intervention Provide education about signs/symptoms and action to take for  hypo/hyperglycemia.;Provide education about proper nutrition, including hydration, and aerobic/resistive exercise prescription along with prescribed medications to achieve blood glucose in normal ranges: Fasting glucose 65-99 mg/dL    Expected Outcomes Short Term: Participant verbalizes understanding of the signs/symptoms and immediate care of hyper/hypoglycemia, proper foot care and importance of medication, aerobic/resistive exercise and nutrition plan for blood glucose control.;Long Term: Attainment of HbA1C < 7%.    Hypertension Yes    Intervention Provide education on lifestyle modifcations including regular physical activity/exercise, weight management, moderate sodium restriction and increased consumption of fresh fruit, vegetables, and low fat dairy, alcohol moderation, and smoking cessation.;Monitor prescription use compliance.    Expected Outcomes Short Term: Continued assessment and intervention until BP is < 140/32mm HG in hypertensive participants. < 130/53mm HG in hypertensive participants with diabetes, heart failure or chronic kidney disease.;Long Term: Maintenance of blood pressure at goal levels.    Lipids Yes    Intervention Provide education and support for participant  on nutrition & aerobic/resistive exercise along with prescribed medications to achieve LDL 70mg , HDL >40mg .    Expected Outcomes Short Term: Participant states understanding of desired cholesterol values and is compliant with medications prescribed. Participant is following exercise prescription and nutrition guidelines.;Long Term: Cholesterol controlled with medications as prescribed, with individualized exercise RX and with personalized nutrition plan. Value goals: LDL < 70mg , HDL > 40 mg.          Education:Diabetes - Individual verbal and written instruction to review signs/symptoms of diabetes, desired ranges of glucose level fasting, after meals and with exercise. Acknowledge that pre and post exercise glucose  checks will be done for 3 sessions at entry of program.   Know Your Numbers and Heart Failure: - Group verbal and visual instruction to discuss disease risk factors for cardiac and pulmonary disease and treatment options.  Reviews associated critical values for Overweight/Obesity, Hypertension, Cholesterol, and Diabetes.  Discusses basics of heart failure: signs/symptoms and treatments.  Introduces Heart Failure Zone chart for action plan for heart failure. Written material provided at class time. Flowsheet Row PULMONARY REHAB OTHER RESPIRATORY from 06/25/2024 in [facility]  Instruction Review Code 1- Verbalizes Understanding    Core Components/Risk Factors/Patient Goals Review:   Goals and Risk Factor Review     Row Name 05/26/24 1049 06/25/24 1105           Core Components/Risk Factors/Patient Goals Review   Personal Goals Review Hypertension;Develop more efficient breathing techniques such as purse lipped breathing and diaphragmatic breathing and practicing self-pacing with activity.;Increase knowledge of respiratory medications and ability to use respiratory devices properly.;Improve shortness of breath with ADL's Diabetes;Lipids;Hypertension;Increase knowledge of respiratory medications and ability to use respiratory devices properly.;Improve shortness of breath with ADL's;Develop more efficient breathing techniques such as purse lipped breathing and diaphragmatic breathing and practicing self-pacing with activity.      Review It is [name]'s first day back to rehab since he has been out sick for the last 2 months. He is glad to be back. [name] is on his 11th session today and continues to do well in rehab.  He takes his respiratory, BP, cholesterol, and diabetes medication as prescribed.  He checks his BP twice a day, and also checks his BS daily.  He weighs at home every few days, and he always documents his readings from home to show his physician.  He wants to improve his  health, and he moved up to level 2 on the Nustep today.      Expected Outcomes Short: Continue to attend rehab. Long: Continue to check vitals at home and report any abnormalities. Short:  Continue to attend rehab  Long:  Continue taking medication as prescribed and recording vitals at home         Core Components/Risk Factors/Patient Goals at Discharge (Final Review):   Goals and Risk Factor Review - 06/25/24 1105       Core Components/Risk Factors/Patient Goals Review   Personal Goals Review Diabetes;Lipids;Hypertension;Increase knowledge of respiratory medications and ability to use respiratory devices properly.;Improve shortness of breath with ADL's;Develop more efficient breathing techniques such as purse lipped breathing and diaphragmatic breathing and practicing self-pacing with activity.    Review [name] is on his 11th session today and continues to do well in rehab.  He takes his respiratory, BP, cholesterol, and diabetes medication as prescribed.  He checks his BP twice a day, and also checks his BS daily.  He weighs at home every few days, and he always  documents his readings from home to show his physician.  He wants to improve his health, and he moved up to level 2 on the Nustep today.    Expected Outcomes Short:  Continue to attend rehab  Long:  Continue taking medication as prescribed and recording vitals at home          ITP Comments:  ITP Comments     Row Name 03/23/24 1338 03/25/24 0955 03/31/24 1028 04/15/24 1532 05/13/24 0900   ITP Comments Virtual orientation visit completed for pulmonary rehab with COPD. On-site orientation visit scheduled for 03/25/24 at 8:30. Patient arrived for 1st visit/orientation/education at 0830. Patient was referred to PR by Dr. Darlean due to COPD. During orientation advised patient on arrival and appointment times what to wear, what to do before, during and after exercise. Reviewed attendance and class policy.  Pt is scheduled to return Pulmonary Rehab  on 03/31/24 at 10:30. Pt was advised to come to class 15 minutes before class starts.  Discussed RPE/Dpysnea scales. Patient participated in warm up stretches. Patient was able to complete 6 minute walk test. Patient was measured for the equipment. Discussed equipment safety with patient. Took patient pre-anthropometric measurements. Patient finished visit at 950. First full day of exercise!  Patient was oriented to gym and equipment including functions, settings, policies, and procedures.  Patient's individual exercise prescription and treatment plan were reviewed.  All starting workloads were established based on the results of the 6 minute walk test done at initial orientation visit.  The plan for exercise progression was also introduced and progression will be customized based on patient's performance and goals. 30 day review completed. ITP sent to Dr.Jehanzeb Memon, Medical Director of  Pulmonary Rehab. Continue with ITP unless changes are made by physician.    Newer to program 30 day review completed. ITP sent to Dr.Jehanzeb Memon, Medical Director of  Pulmonary Rehab. Continue with ITP unless changes are made by physician.    Newer to program, been out sick with pneumonia.    Row Name 05/19/24 1106 06/09/24 1058 07/07/24 1445 08/04/24 0957     ITP Comments Called to check on patient.  He is still in a lot of pain in hips and back. He is scheduled to meet with a new doctor for his pain later this week.  He hopes to be able to return next week. 30 day review completed. ITP sent to Dr.Jehanzeb Memon, Medical Director of  Pulmonary Rehab. Continue with ITP unless changes are made by physician. 30 day review completed. ITP sent to Dr.Jehanzeb Memon, Medical Director of  Pulmonary Rehab. Continue with ITP unless changes are made by physician. 30 day review completed. ITP sent to Dr.Jehanzeb Memon, Medical Director of  Pulmonary Rehab. Goals not obtained due to patient being out with illness.       Comments:  30 day review.     [1]  Current Outpatient Medications:    acetaminophen  (TYLENOL ) 325 MG tablet, Take 2 tablets (650 mg total) by mouth every 6 (six) hours as needed for mild pain (pain score 1-3) (or Fever >/= 101)., Disp: , Rfl:    albuterol  (PROVENTIL ) (2.5 MG/3ML) 0.083% nebulizer solution, Take 3 mLs (2.5 mg total) by nebulization every 2 (two) hours as needed for wheezing or shortness of breath (cough)., Disp: 75 mL, Rfl: 2   albuterol  (VENTOLIN  HFA) 108 (90 Base) MCG/ACT inhaler, Inhale 2 puffs into the lungs every 4 (four) hours as needed for wheezing or shortness of breath., Disp: 8  g, Rfl: 2   ALPRAZolam  (XANAX ) 0.25 MG tablet, Take 0.25 mg by mouth 2 (two) times daily as needed for sleep or anxiety., Disp: , Rfl:    aspirin  EC 81 MG tablet, Take 81 mg by mouth daily. Swallow whole., Disp: , Rfl:    atorvastatin  (LIPITOR) 40 MG tablet, Take 40 mg by mouth in the morning., Disp: , Rfl:    BREZTRI  AEROSPHERE 160-9-4.8 MCG/ACT AERO inhaler, INHALE 2 PUFFS INTO THE LUNGS TWICE A DAY, Disp: 10.7 each, Rfl: 11   Cholecalciferol  (VITAMIN D3) 25 MCG (1000 UT) CAPS, Take 1,000 Units by mouth in the morning., Disp: , Rfl:    diltiazem  (CARDIZEM  CD) 240 MG 24 hr capsule, Take 240 mg by mouth daily., Disp: , Rfl:    EMBECTA PEN NEEDLE ULTRAFINE 31G X 5 MM MISC, , Disp: , Rfl:    famotidine  (PEPCID ) 20 MG tablet, TAKE 1 TABLET BY MOUTH EVERY DAY AFTER SUPPER, Disp: 90 tablet, Rfl: 0   fluticasone  (FLONASE ) 50 MCG/ACT nasal spray, Place 1 spray into both nostrils daily as needed for allergies or rhinitis., Disp: , Rfl:    furosemide  (LASIX ) 40 MG tablet, Take 1 tablet (40 mg total) by mouth daily in the morning and take 1/2 tablet (20 mg total) by mouth daily in the afternoon., Disp: 135 tablet, Rfl: 1   gabapentin  (NEURONTIN ) 300 MG capsule, Take 1 capsule (300 mg total) by mouth at bedtime., Disp: 30 capsule, Rfl: 5   glimepiride  (AMARYL ) 1 MG tablet, Take 1 mg by mouth daily with breakfast.,  Disp: , Rfl:    insulin  glargine, 2 Unit Dial, (TOUJEO  MAX) 300 UNIT/ML Solostar Pen, Inject into the skin., Disp: , Rfl:    metFORMIN  (GLUCOPHAGE ) 1000 MG tablet, Take 1,000 mg by mouth daily with supper., Disp: , Rfl:    Multiple Vitamin (MULTIVITAMIN WITH MINERALS) TABS tablet, Take 1 tablet by mouth at bedtime., Disp: 30 tablet, Rfl: 2   OXYGEN , Inhale 4 L into the lungs continuous., Disp: , Rfl:    pantoprazole  (PROTONIX ) 40 MG tablet, TAKE 1 TABLET (40 MG TOTAL) BY MOUTH IN THE MORNING, Disp: 90 tablet, Rfl: 1   pioglitazone (ACTOS) 15 MG tablet, Take 15 mg by mouth daily., Disp: , Rfl:    predniSONE  (DELTASONE ) 20 MG tablet, Take 20 mg by mouth 2 (two) times daily., Disp: , Rfl:    pyridOXINE (B-6) 50 MG tablet, Take 50 mg by mouth daily., Disp: , Rfl:    sertraline  (ZOLOFT ) 50 MG tablet, Take 1 tablet (50 mg total) by mouth daily., Disp: 30 tablet, Rfl: 3   tamsulosin  (FLOMAX ) 0.4 MG CAPS capsule, Take 0.4 mg by mouth in the morning., Disp: , Rfl:    Vibegron  (GEMTESA ) 75 MG TABS, Take 1 tablet (75 mg total) by mouth daily., Disp: , Rfl:    vitamin C  (ASCORBIC ACID ) 250 MG tablet, Take 250 mg by mouth in the morning., Disp: , Rfl:    Wheat Dextrin (BENEFIBER PO), Take 1 Capful by mouth daily., Disp: , Rfl:  [2]  Social History Tobacco Use  Smoking Status Former   Current packs/day: 0.00   Average packs/day: 1.3 packs/day for 36.0 years (45.0 ttl pk-yrs)   Types: Cigarettes   Start date: 04/1975   Quit date: 04/2011   Years since quitting: 13.3  Smokeless Tobacco Never

## 2024-08-11 ENCOUNTER — Ambulatory Visit

## 2024-09-30 ENCOUNTER — Ambulatory Visit: Admitting: Gastroenterology

## 2025-04-20 ENCOUNTER — Inpatient Hospital Stay

## 2025-04-27 ENCOUNTER — Inpatient Hospital Stay: Admitting: Physician Assistant
